# Patient Record
Sex: Female | Born: 1945 | Race: Black or African American | Hispanic: No | Smoking: Never smoker
Health system: Southern US, Community
[De-identification: ages and names within clinical notes are randomized; demographics above are authoritative.]

## PROBLEM LIST (undated history)

## (undated) DIAGNOSIS — K449 Diaphragmatic hernia without obstruction or gangrene: Secondary | ICD-10-CM

## (undated) DIAGNOSIS — R519 Headache, unspecified: Secondary | ICD-10-CM

## (undated) DIAGNOSIS — T7840XA Allergy, unspecified, initial encounter: Secondary | ICD-10-CM

## (undated) DIAGNOSIS — R51 Headache: Secondary | ICD-10-CM

## (undated) DIAGNOSIS — K219 Gastro-esophageal reflux disease without esophagitis: Secondary | ICD-10-CM

## (undated) DIAGNOSIS — I10 Essential (primary) hypertension: Secondary | ICD-10-CM

## (undated) DIAGNOSIS — M1990 Unspecified osteoarthritis, unspecified site: Secondary | ICD-10-CM

## (undated) DIAGNOSIS — D126 Benign neoplasm of colon, unspecified: Secondary | ICD-10-CM

## (undated) DIAGNOSIS — M47816 Spondylosis without myelopathy or radiculopathy, lumbar region: Secondary | ICD-10-CM

## (undated) DIAGNOSIS — K649 Unspecified hemorrhoids: Secondary | ICD-10-CM

## (undated) DIAGNOSIS — D649 Anemia, unspecified: Secondary | ICD-10-CM

## (undated) DIAGNOSIS — J309 Allergic rhinitis, unspecified: Secondary | ICD-10-CM

## (undated) DIAGNOSIS — E785 Hyperlipidemia, unspecified: Secondary | ICD-10-CM

## (undated) DIAGNOSIS — G8929 Other chronic pain: Secondary | ICD-10-CM

## (undated) DIAGNOSIS — K5900 Constipation, unspecified: Secondary | ICD-10-CM

## (undated) DIAGNOSIS — E669 Obesity, unspecified: Secondary | ICD-10-CM

## (undated) DIAGNOSIS — H269 Unspecified cataract: Secondary | ICD-10-CM

## (undated) DIAGNOSIS — K5790 Diverticulosis of intestine, part unspecified, without perforation or abscess without bleeding: Secondary | ICD-10-CM

## (undated) DIAGNOSIS — I509 Heart failure, unspecified: Secondary | ICD-10-CM

## (undated) HISTORY — DX: Unspecified osteoarthritis, unspecified site: M19.90

## (undated) HISTORY — PX: ABLATION: SHX5711

## (undated) HISTORY — DX: Headache, unspecified: R51.9

## (undated) HISTORY — PX: COLONOSCOPY: SHX174

## (undated) HISTORY — DX: Hyperlipidemia, unspecified: E78.5

## (undated) HISTORY — DX: Anemia, unspecified: D64.9

## (undated) HISTORY — DX: Allergy, unspecified, initial encounter: T78.40XA

## (undated) HISTORY — DX: Heart failure, unspecified: I50.9

## (undated) HISTORY — DX: Unspecified hemorrhoids: K64.9

## (undated) HISTORY — DX: Spondylosis without myelopathy or radiculopathy, lumbar region: M47.816

## (undated) HISTORY — DX: Diverticulosis of intestine, part unspecified, without perforation or abscess without bleeding: K57.90

## (undated) HISTORY — DX: Obesity, unspecified: E66.9

## (undated) HISTORY — DX: Essential (primary) hypertension: I10

## (undated) HISTORY — DX: Diaphragmatic hernia without obstruction or gangrene: K44.9

## (undated) HISTORY — DX: Other chronic pain: G89.29

## (undated) HISTORY — PX: TUBAL LIGATION: SHX77

## (undated) HISTORY — DX: Benign neoplasm of colon, unspecified: D12.6

## (undated) HISTORY — DX: Gastro-esophageal reflux disease without esophagitis: K21.9

## (undated) HISTORY — DX: Headache: R51

## (undated) HISTORY — DX: Allergic rhinitis, unspecified: J30.9

---

## 1998-01-29 ENCOUNTER — Other Ambulatory Visit: Admission: RE | Admit: 1998-01-29 | Discharge: 1998-01-29 | Payer: Self-pay | Admitting: *Deleted

## 1998-10-25 ENCOUNTER — Other Ambulatory Visit: Admission: RE | Admit: 1998-10-25 | Discharge: 1998-10-25 | Payer: Self-pay | Admitting: *Deleted

## 1999-04-30 ENCOUNTER — Encounter: Payer: Self-pay | Admitting: Emergency Medicine

## 1999-04-30 ENCOUNTER — Emergency Department (HOSPITAL_COMMUNITY): Admission: EM | Admit: 1999-04-30 | Discharge: 1999-04-30 | Payer: Self-pay | Admitting: Emergency Medicine

## 1999-07-03 ENCOUNTER — Other Ambulatory Visit: Admission: RE | Admit: 1999-07-03 | Discharge: 1999-07-03 | Payer: Self-pay | Admitting: *Deleted

## 1999-11-20 ENCOUNTER — Ambulatory Visit (HOSPITAL_COMMUNITY): Admission: RE | Admit: 1999-11-20 | Discharge: 1999-11-20 | Payer: Self-pay | Admitting: Family Medicine

## 1999-11-20 ENCOUNTER — Encounter: Payer: Self-pay | Admitting: Family Medicine

## 1999-12-12 ENCOUNTER — Ambulatory Visit (HOSPITAL_COMMUNITY): Admission: RE | Admit: 1999-12-12 | Discharge: 1999-12-12 | Payer: Self-pay | Admitting: Family Medicine

## 1999-12-12 ENCOUNTER — Encounter: Payer: Self-pay | Admitting: Family Medicine

## 2000-12-01 ENCOUNTER — Other Ambulatory Visit: Admission: RE | Admit: 2000-12-01 | Discharge: 2000-12-01 | Payer: Self-pay | Admitting: *Deleted

## 2001-01-07 ENCOUNTER — Other Ambulatory Visit: Admission: RE | Admit: 2001-01-07 | Discharge: 2001-01-07 | Payer: Self-pay | Admitting: *Deleted

## 2001-01-07 ENCOUNTER — Encounter (INDEPENDENT_AMBULATORY_CARE_PROVIDER_SITE_OTHER): Payer: Self-pay | Admitting: Specialist

## 2001-08-05 ENCOUNTER — Encounter: Payer: Self-pay | Admitting: Family Medicine

## 2001-08-05 ENCOUNTER — Ambulatory Visit (HOSPITAL_COMMUNITY): Admission: RE | Admit: 2001-08-05 | Discharge: 2001-08-05 | Payer: Self-pay | Admitting: Family Medicine

## 2001-10-03 ENCOUNTER — Encounter: Admission: RE | Admit: 2001-10-03 | Discharge: 2001-10-03 | Payer: Self-pay | Admitting: Family Medicine

## 2001-10-03 ENCOUNTER — Encounter: Payer: Self-pay | Admitting: Family Medicine

## 2001-10-29 ENCOUNTER — Encounter: Admission: RE | Admit: 2001-10-29 | Discharge: 2001-10-29 | Payer: Self-pay | Admitting: Family Medicine

## 2001-10-29 ENCOUNTER — Encounter: Payer: Self-pay | Admitting: Family Medicine

## 2001-11-14 ENCOUNTER — Encounter: Payer: Self-pay | Admitting: Family Medicine

## 2001-11-14 ENCOUNTER — Encounter: Admission: RE | Admit: 2001-11-14 | Discharge: 2001-11-14 | Payer: Self-pay | Admitting: Family Medicine

## 2002-08-15 ENCOUNTER — Ambulatory Visit (HOSPITAL_COMMUNITY): Admission: RE | Admit: 2002-08-15 | Discharge: 2002-08-15 | Payer: Self-pay | Admitting: Family Medicine

## 2002-08-15 ENCOUNTER — Encounter: Payer: Self-pay | Admitting: Family Medicine

## 2003-11-06 ENCOUNTER — Ambulatory Visit (HOSPITAL_COMMUNITY): Admission: RE | Admit: 2003-11-06 | Discharge: 2003-11-06 | Payer: Self-pay | Admitting: Specialist

## 2004-12-11 ENCOUNTER — Other Ambulatory Visit: Admission: RE | Admit: 2004-12-11 | Discharge: 2004-12-11 | Payer: Self-pay | Admitting: *Deleted

## 2005-02-27 ENCOUNTER — Ambulatory Visit (HOSPITAL_COMMUNITY): Admission: RE | Admit: 2005-02-27 | Discharge: 2005-02-27 | Payer: Self-pay | Admitting: *Deleted

## 2005-06-11 ENCOUNTER — Emergency Department (HOSPITAL_COMMUNITY): Admission: EM | Admit: 2005-06-11 | Discharge: 2005-06-11 | Payer: Self-pay | Admitting: *Deleted

## 2006-04-22 ENCOUNTER — Other Ambulatory Visit: Admission: RE | Admit: 2006-04-22 | Discharge: 2006-04-22 | Payer: Self-pay | Admitting: *Deleted

## 2006-05-11 ENCOUNTER — Ambulatory Visit (HOSPITAL_COMMUNITY): Admission: RE | Admit: 2006-05-11 | Discharge: 2006-05-11 | Payer: Self-pay | Admitting: Family Medicine

## 2007-02-10 ENCOUNTER — Ambulatory Visit: Payer: Self-pay | Admitting: Nurse Practitioner

## 2007-02-11 ENCOUNTER — Ambulatory Visit: Payer: Self-pay | Admitting: *Deleted

## 2007-03-30 ENCOUNTER — Ambulatory Visit: Payer: Self-pay | Admitting: Internal Medicine

## 2007-04-14 ENCOUNTER — Ambulatory Visit: Payer: Self-pay | Admitting: Internal Medicine

## 2007-04-15 ENCOUNTER — Ambulatory Visit: Payer: Self-pay | Admitting: Obstetrics & Gynecology

## 2007-04-15 ENCOUNTER — Encounter: Payer: Self-pay | Admitting: Obstetrics & Gynecology

## 2007-04-18 ENCOUNTER — Ambulatory Visit (HOSPITAL_COMMUNITY): Admission: RE | Admit: 2007-04-18 | Discharge: 2007-04-18 | Payer: Self-pay | Admitting: Obstetrics & Gynecology

## 2007-06-06 ENCOUNTER — Ambulatory Visit: Payer: Self-pay | Admitting: Internal Medicine

## 2007-06-06 ENCOUNTER — Encounter (INDEPENDENT_AMBULATORY_CARE_PROVIDER_SITE_OTHER): Payer: Self-pay | Admitting: Nurse Practitioner

## 2007-06-06 LAB — CONVERTED CEMR LAB
Cholesterol: 262 mg/dL — ABNORMAL HIGH (ref 0–200)
HDL: 63 mg/dL (ref >39)
LDL Cholesterol: 158 mg/dL — ABNORMAL HIGH (ref 0–99)
Total CHOL/HDL Ratio: 4.2

## 2007-06-17 ENCOUNTER — Ambulatory Visit: Payer: Self-pay | Admitting: Family Medicine

## 2007-06-23 ENCOUNTER — Ambulatory Visit (HOSPITAL_COMMUNITY): Admission: RE | Admit: 2007-06-23 | Discharge: 2007-06-23 | Payer: Self-pay | Admitting: Obstetrics & Gynecology

## 2007-07-07 ENCOUNTER — Ambulatory Visit: Payer: Self-pay | Admitting: Internal Medicine

## 2007-07-11 ENCOUNTER — Ambulatory Visit (HOSPITAL_COMMUNITY): Admission: RE | Admit: 2007-07-11 | Discharge: 2007-07-11 | Payer: Self-pay | Admitting: Internal Medicine

## 2007-08-12 ENCOUNTER — Ambulatory Visit: Payer: Self-pay | Admitting: Obstetrics & Gynecology

## 2007-08-23 ENCOUNTER — Encounter (INDEPENDENT_AMBULATORY_CARE_PROVIDER_SITE_OTHER): Payer: Self-pay | Admitting: Nurse Practitioner

## 2007-08-23 ENCOUNTER — Ambulatory Visit: Payer: Self-pay | Admitting: Internal Medicine

## 2007-08-23 LAB — CONVERTED CEMR LAB
ALT: 15 units/L (ref 0–35)
AST: 15 units/L (ref 0–37)
Albumin: 4.3 g/dL (ref 3.5–5.2)
Alkaline Phosphatase: 54 units/L (ref 39–117)
Bilirubin, Direct: 0.1 mg/dL (ref 0.0–0.3)
Cholesterol: 231 mg/dL — ABNORMAL HIGH (ref 0–200)
HDL: 60 mg/dL (ref >39)
Indirect Bilirubin: 0.5 mg/dL (ref 0.0–0.9)
Total Protein: 7.7 g/dL (ref 6.0–8.3)
VLDL: 23 mg/dL (ref 0–40)

## 2007-08-24 ENCOUNTER — Ambulatory Visit (HOSPITAL_COMMUNITY): Admission: RE | Admit: 2007-08-24 | Discharge: 2007-08-24 | Payer: Self-pay | Admitting: Family Medicine

## 2007-09-06 ENCOUNTER — Ambulatory Visit: Payer: Self-pay | Admitting: Family Medicine

## 2007-10-24 ENCOUNTER — Ambulatory Visit: Payer: Self-pay | Admitting: Internal Medicine

## 2007-11-22 ENCOUNTER — Ambulatory Visit: Payer: Self-pay | Admitting: Internal Medicine

## 2007-12-22 ENCOUNTER — Ambulatory Visit: Payer: Self-pay | Admitting: Family Medicine

## 2007-12-22 ENCOUNTER — Encounter (INDEPENDENT_AMBULATORY_CARE_PROVIDER_SITE_OTHER): Payer: Self-pay | Admitting: Nurse Practitioner

## 2007-12-22 LAB — CONVERTED CEMR LAB
LDL Cholesterol: 174 mg/dL — ABNORMAL HIGH (ref 0–99)
Triglycerides: 163 mg/dL — ABNORMAL HIGH (ref <150)
VLDL: 33 mg/dL (ref 0–40)

## 2008-02-16 ENCOUNTER — Ambulatory Visit: Payer: Self-pay | Admitting: Internal Medicine

## 2008-02-16 ENCOUNTER — Encounter (INDEPENDENT_AMBULATORY_CARE_PROVIDER_SITE_OTHER): Payer: Self-pay | Admitting: Nurse Practitioner

## 2008-02-16 LAB — CONVERTED CEMR LAB
ALT: 20 units/L (ref 0–35)
Basophils Relative: 1 % (ref 0–1)
CO2: 27 meq/L (ref 19–32)
Eosinophils Absolute: 0.9 10*3/uL — ABNORMAL HIGH (ref 0.0–0.7)
Eosinophils Relative: 12 % — ABNORMAL HIGH (ref 0–5)
Glucose, Bld: 93 mg/dL (ref 70–99)
MCHC: 33.8 g/dL (ref 30.0–36.0)
Neutrophils Relative %: 39 % — ABNORMAL LOW (ref 43–77)
Platelets: 318 10*3/uL (ref 150–400)
Potassium: 3.7 meq/L (ref 3.5–5.3)
RBC: 4.56 M/uL (ref 3.87–5.11)
RDW: 13.1 % (ref 11.5–15.5)
TSH: 0.438 microintl units/mL (ref 0.350–5.50)
Total Bilirubin: 0.5 mg/dL (ref 0.3–1.2)

## 2008-03-28 ENCOUNTER — Ambulatory Visit: Payer: Self-pay | Admitting: Internal Medicine

## 2008-04-11 ENCOUNTER — Ambulatory Visit: Payer: Self-pay | Admitting: Internal Medicine

## 2008-05-03 ENCOUNTER — Encounter: Payer: Self-pay | Admitting: Obstetrics and Gynecology

## 2008-05-03 ENCOUNTER — Ambulatory Visit: Payer: Self-pay | Admitting: Obstetrics and Gynecology

## 2008-05-21 ENCOUNTER — Ambulatory Visit: Payer: Self-pay | Admitting: Internal Medicine

## 2008-05-24 ENCOUNTER — Ambulatory Visit: Payer: Self-pay | Admitting: Internal Medicine

## 2008-05-24 ENCOUNTER — Encounter: Payer: Self-pay | Admitting: Internal Medicine

## 2008-05-24 LAB — CONVERTED CEMR LAB
Cholesterol: 212 mg/dL — ABNORMAL HIGH (ref 0–200)
HDL: 63 mg/dL (ref >39)
LDL Cholesterol: 115 mg/dL — ABNORMAL HIGH (ref 0–99)
VLDL: 34 mg/dL (ref 0–40)

## 2008-06-18 ENCOUNTER — Ambulatory Visit: Payer: Self-pay | Admitting: Internal Medicine

## 2008-06-27 ENCOUNTER — Ambulatory Visit: Payer: Self-pay | Admitting: Internal Medicine

## 2008-07-02 ENCOUNTER — Emergency Department (HOSPITAL_COMMUNITY): Admission: EM | Admit: 2008-07-02 | Discharge: 2008-07-02 | Payer: Self-pay | Admitting: Family Medicine

## 2008-07-26 ENCOUNTER — Ambulatory Visit: Payer: Self-pay | Admitting: Internal Medicine

## 2008-08-09 ENCOUNTER — Ambulatory Visit: Payer: Self-pay | Admitting: Internal Medicine

## 2008-09-03 ENCOUNTER — Ambulatory Visit (HOSPITAL_COMMUNITY): Admission: RE | Admit: 2008-09-03 | Discharge: 2008-09-03 | Payer: Self-pay | Admitting: Family Medicine

## 2008-09-11 ENCOUNTER — Ambulatory Visit: Payer: Self-pay | Admitting: Internal Medicine

## 2008-09-11 LAB — CONVERTED CEMR LAB
Cholesterol: 210 mg/dL — ABNORMAL HIGH (ref 0–200)
HCV Ab: NEGATIVE
HDL: 66 mg/dL (ref >39)
Hep A Total Ab: NEGATIVE
Hepatitis B Surface Ag: NEGATIVE
Triglycerides: 133 mg/dL (ref <150)
VLDL: 27 mg/dL (ref 0–40)

## 2008-09-30 ENCOUNTER — Emergency Department (HOSPITAL_COMMUNITY): Admission: EM | Admit: 2008-09-30 | Discharge: 2008-09-30 | Payer: Self-pay | Admitting: Family Medicine

## 2008-12-12 ENCOUNTER — Ambulatory Visit: Payer: Self-pay | Admitting: Internal Medicine

## 2009-01-31 ENCOUNTER — Telehealth (INDEPENDENT_AMBULATORY_CARE_PROVIDER_SITE_OTHER): Payer: Self-pay | Admitting: *Deleted

## 2009-02-01 ENCOUNTER — Ambulatory Visit: Payer: Self-pay | Admitting: Internal Medicine

## 2009-02-01 LAB — CONVERTED CEMR LAB
HDL: 60 mg/dL (ref >39)
LDL Cholesterol: 103 mg/dL — ABNORMAL HIGH (ref 0–99)
Total CHOL/HDL Ratio: 3
Triglycerides: 76 mg/dL (ref <150)
VLDL: 15 mg/dL (ref 0–40)

## 2009-06-27 ENCOUNTER — Ambulatory Visit: Payer: Self-pay | Admitting: Family Medicine

## 2009-09-19 ENCOUNTER — Ambulatory Visit (HOSPITAL_COMMUNITY): Admission: RE | Admit: 2009-09-19 | Discharge: 2009-09-19 | Payer: Self-pay | Admitting: Internal Medicine

## 2009-09-19 ENCOUNTER — Ambulatory Visit: Payer: Self-pay | Admitting: Obstetrics & Gynecology

## 2009-09-23 ENCOUNTER — Ambulatory Visit: Payer: Self-pay | Admitting: Internal Medicine

## 2009-11-18 ENCOUNTER — Ambulatory Visit: Payer: Self-pay | Admitting: Internal Medicine

## 2010-01-22 ENCOUNTER — Ambulatory Visit: Payer: Self-pay | Admitting: Internal Medicine

## 2010-01-22 LAB — CONVERTED CEMR LAB
AST: 15 units/L (ref 0–37)
BUN: 19 mg/dL (ref 6–23)
CO2: 26 meq/L (ref 19–32)
Cholesterol: 199 mg/dL (ref 0–200)
Potassium: 3.7 meq/L (ref 3.5–5.3)
Sodium: 143 meq/L (ref 135–145)
Total CHOL/HDL Ratio: 2.6
Total Protein: 7.4 g/dL (ref 6.0–8.3)
Triglycerides: 83 mg/dL (ref <150)

## 2010-04-06 ENCOUNTER — Emergency Department (HOSPITAL_COMMUNITY): Admission: EM | Admit: 2010-04-06 | Discharge: 2010-04-06 | Payer: Self-pay | Admitting: Family Medicine

## 2010-04-28 ENCOUNTER — Ambulatory Visit: Payer: Self-pay | Admitting: Internal Medicine

## 2010-09-22 ENCOUNTER — Ambulatory Visit (HOSPITAL_COMMUNITY)
Admission: RE | Admit: 2010-09-22 | Discharge: 2010-09-22 | Payer: Self-pay | Source: Home / Self Care | Admitting: Internal Medicine

## 2010-10-31 ENCOUNTER — Encounter: Payer: Self-pay | Admitting: Cardiology

## 2010-10-31 ENCOUNTER — Encounter (INDEPENDENT_AMBULATORY_CARE_PROVIDER_SITE_OTHER): Payer: Self-pay | Admitting: Family Medicine

## 2010-10-31 LAB — CONVERTED CEMR LAB
BUN: 26 mg/dL — ABNORMAL HIGH (ref 6–23)
Basophils Relative: 0 % (ref 0–1)
Calcium: 10.2 mg/dL (ref 8.4–10.5)
HCT: 38.6 % (ref 36.0–46.0)
Hemoglobin: 12.5 g/dL (ref 12.0–15.0)
Lymphocytes Relative: 30 % (ref 12–46)
Lymphs Abs: 2.4 10*3/uL (ref 0.7–4.0)
MCHC: 32.4 g/dL (ref 30.0–36.0)
MCV: 86.9 fL (ref 78.0–100.0)
Monocytes Absolute: 0.7 10*3/uL (ref 0.1–1.0)
Monocytes Relative: 8 % (ref 3–12)
RDW: 13.8 % (ref 11.5–15.5)
Sodium: 138 meq/L (ref 135–145)

## 2010-11-04 ENCOUNTER — Ambulatory Visit (HOSPITAL_COMMUNITY)
Admission: RE | Admit: 2010-11-04 | Discharge: 2010-11-04 | Payer: Self-pay | Source: Home / Self Care | Attending: Family Medicine | Admitting: Family Medicine

## 2010-11-08 ENCOUNTER — Encounter: Payer: Self-pay | Admitting: Specialist

## 2010-11-09 ENCOUNTER — Encounter: Payer: Self-pay | Admitting: *Deleted

## 2010-11-21 ENCOUNTER — Ambulatory Visit: Admit: 2010-11-21 | Payer: Self-pay | Admitting: Cardiology

## 2010-11-21 ENCOUNTER — Ambulatory Visit (INDEPENDENT_AMBULATORY_CARE_PROVIDER_SITE_OTHER): Payer: Self-pay | Admitting: Cardiology

## 2010-11-21 ENCOUNTER — Encounter: Payer: Self-pay | Admitting: Cardiology

## 2010-11-21 DIAGNOSIS — R002 Palpitations: Secondary | ICD-10-CM | POA: Insufficient documentation

## 2010-11-21 DIAGNOSIS — R0609 Other forms of dyspnea: Secondary | ICD-10-CM

## 2010-11-28 ENCOUNTER — Encounter: Payer: Self-pay | Admitting: Cardiology

## 2010-11-28 ENCOUNTER — Ambulatory Visit (HOSPITAL_COMMUNITY): Payer: Self-pay | Attending: Cardiology

## 2010-11-28 ENCOUNTER — Other Ambulatory Visit: Payer: Self-pay | Admitting: Cardiology

## 2010-11-28 ENCOUNTER — Other Ambulatory Visit (INDEPENDENT_AMBULATORY_CARE_PROVIDER_SITE_OTHER): Payer: Self-pay

## 2010-11-28 ENCOUNTER — Encounter (INDEPENDENT_AMBULATORY_CARE_PROVIDER_SITE_OTHER): Payer: Self-pay

## 2010-11-28 DIAGNOSIS — R0609 Other forms of dyspnea: Secondary | ICD-10-CM | POA: Insufficient documentation

## 2010-11-28 DIAGNOSIS — R0989 Other specified symptoms and signs involving the circulatory and respiratory systems: Secondary | ICD-10-CM | POA: Insufficient documentation

## 2010-11-28 DIAGNOSIS — Z79899 Other long term (current) drug therapy: Secondary | ICD-10-CM

## 2010-11-28 DIAGNOSIS — I10 Essential (primary) hypertension: Secondary | ICD-10-CM | POA: Insufficient documentation

## 2010-11-28 DIAGNOSIS — I379 Nonrheumatic pulmonary valve disorder, unspecified: Secondary | ICD-10-CM | POA: Insufficient documentation

## 2010-11-28 DIAGNOSIS — E669 Obesity, unspecified: Secondary | ICD-10-CM | POA: Insufficient documentation

## 2010-11-28 DIAGNOSIS — R002 Palpitations: Secondary | ICD-10-CM | POA: Insufficient documentation

## 2010-11-28 DIAGNOSIS — E785 Hyperlipidemia, unspecified: Secondary | ICD-10-CM | POA: Insufficient documentation

## 2010-11-28 DIAGNOSIS — I059 Rheumatic mitral valve disease, unspecified: Secondary | ICD-10-CM | POA: Insufficient documentation

## 2010-11-28 DIAGNOSIS — I079 Rheumatic tricuspid valve disease, unspecified: Secondary | ICD-10-CM | POA: Insufficient documentation

## 2010-11-28 LAB — HEPATIC FUNCTION PANEL
Alkaline Phosphatase: 64 U/L (ref 39–117)
Bilirubin, Direct: 0.1 mg/dL (ref 0.0–0.3)
Total Protein: 6.7 g/dL (ref 6.0–8.3)

## 2010-11-28 LAB — LIPID PANEL
HDL: 65.4 mg/dL (ref >39.00)
LDL Cholesterol: 91 mg/dL (ref 0–99)
Total CHOL/HDL Ratio: 3

## 2010-12-04 NOTE — Assessment & Plan Note (Signed)
Summary: NP6/PALPITATIONS/LG/PER DEBORAH PT HAS [place]...   Vital Signs:  Patient profile:   65 year old female Height:      63 inches Weight:      192 pounds BMI:     34.13 Pulse rate:   54 / minute Pulse rhythm:   regular BP sitting:   126 / 68  (left arm) Cuff size:   large  Vitals Entered By: Doug Sou CMA (November 21, 2010 9:21 AM)  Primary Provider:  McPherson   History of Present Illness: 65 yo with history of HTN and hyperlipidemia presents for evaluation of dyspnea and palpitations.  Patient reports dyspnea with walking about 1/4 mile and shortness of breath with steps for the last several months.  She has had some weight gain over the last year or so as well.  She has also been getting fluttering in her chest and spells of tachycardia that will last for a few seconds.  She will feel "dizzy" with these spells but no syncope.  No chest pain, no orthopnea or PND.    ECG: NSR, normal  Labs (1/12): K 4.7, creatinine 0.93, HCT 38.6, TSH normal  Current Medications (verified): 1)  Tiazac 120 Mg Xr24h-Cap (Diltiazem Hcl Er Beads) .... Take One Capsule Once Daily 2)  Nasacort Aq 55 Mcg/act Aers (Triamcinolone Acetonide) .... Use Two Sprays in Each Nostril At Night 3)  Tricor 145 Mg Tabs (Fenofibrate) .... Take One Tablet Once Daily 4)  Diovan Hct 320-25 Mg Tabs (Valsartan-Hydrochlorothiazide) .... Take One Tablet Once Daily 5)  Lipitor 40 Mg Tabs (Atorvastatin Calcium) .... Take One Tablet Once Daily 6)  Xyzal 5 Mg Tabs (Levocetirizine Dihydrochloride) .... Take One Tablet Once Daily 7)  Famotidine 20 Mg Tabs (Famotidine) .... Take 2 Tablets Every Evening 8)  Nexium 40 Mg Cpdr (Esomeprazole Magnesium) .... Take One Capsule Once Daily 9)  Metoprolol Tartrate 50 Mg Tabs (Metoprolol Tartrate) .... Take 2 Tablets Once Daily 10)  Dok 100 Mg Caps (Docusate Sodium) .... Take One Capsule At Bedtime  Allergies (verified): 1)  ! Sulfa  Past History:  Past  Medical History: 1. GERD 2. Hyperlipidemia 3. HTN 4. Osteoarthritis 5. Obesity  Family History: Mother, father, and sister with "heart trouble"  Social History: Nonsmoker.   Lives in [place] with grandson Does custodial work  Review of Systems       All systems reviewed and negative except as per HPI.   Physical Exam  General:  Well developed, well nourished, in no acute distress.  Obese.  Head:  normocephalic and atraumatic Nose:  no deformity, discharge, inflammation, or lesions Mouth:  Teeth, gums and palate normal. Oral mucosa normal. Neck:  Neck supple, no JVD. No masses, thyromegaly or abnormal cervical nodes. Lungs:  Clear bilaterally to auscultation and percussion. Heart:  Non-displaced PMI, chest non-tender; regular rate and rhythm, S1, S2 without murmurs, rubs or gallops. Carotid upstroke normal, no bruit.  Pedals normal pulses. No edema, no varicosities. Abdomen:  Bowel sounds positive; abdomen soft and non-tender without masses, organomegaly, or hernias noted. No hepatosplenomegaly. Extremities:  No clubbing or cyanosis. Neurologic:  Alert and oriented x 3. Skin:  Intact without lesions or rashes. Psych:  Normal affect.   Impression & Recommendations:  Problem # 1:  PALPITATIONS (ICD-785.1) Patient has runs of chest fluttering that feel like her heart racing and last for a few seconds.  These happen most days.  I will get a 48 hour holter monitor to make sure that  there is no evidence for a significant arrhythmia like atrial fibrillation.   Problem # 2:  SHORTNESS OF BREATH (ICD-786.05) Patient has some mild exertional dyspnea that seems new over the last 6 months or so.  She has also gained weight during this period.  I will get an echo to assess LV systolic and diastolic function, but I suspect that it is more likely to be due to obesity and deconditioning.  She needs to start a walking program for exercise.    I will check lipids/LFTs.   Other  Orders: Holter Monitor (Holter Monitor) Echocardiogram (Echo)  Patient Instructions: 1)  Your physician has requested that you have an echocardiogram.  Echocardiography is a painless test that uses sound waves to create images of your heart. It provides your doctor with information about the size and shape of your heart and how well your heart's chambers and valves are working.  This procedure takes approximately one hour. There are no restrictions for this procedure. 2)  Your physician has recommended that you wear a holter monitor.  Holter monitors are medical devices that record the heart's electrical activity. Doctors most often use these monitors to diagnose arrhythmias. Arrhythmias are problems with the speed or rhythm of the heartbeat. The monitor is a small, portable device. You can wear one while you do your normal daily activities. This is usually used to diagnose what is causing palpitations/syncope (passing out). [place] 3)  Your physician recommends that you return for a FASTING lipid profile/liver profile 785.1  4)  Your physician recommends that you schedule a follow-up appointment in: 2 weeks with Dr Aundra Dubin.   Orders Added: 1)  Holter Monitor [Holter Monitor] 2)  Echocardiogram [Echo]

## 2010-12-10 ENCOUNTER — Encounter: Payer: Self-pay | Admitting: Cardiology

## 2010-12-10 ENCOUNTER — Ambulatory Visit (INDEPENDENT_AMBULATORY_CARE_PROVIDER_SITE_OTHER): Payer: Self-pay | Admitting: Cardiology

## 2010-12-10 DIAGNOSIS — R0989 Other specified symptoms and signs involving the circulatory and respiratory systems: Secondary | ICD-10-CM

## 2010-12-16 NOTE — Procedures (Signed)
Summary: summary report  summary report   Imported By: Parks Neptune 12/10/2010 16:25:36  _____________________________________________________________________  External Attachment:    Type:   Image     Comment:   External Document

## 2010-12-16 NOTE — Assessment & Plan Note (Signed)
Summary: f3wks per check out/ f/u echo/monitor/lab/saf   Primary Provider:  McPherson  CC:  pt complains of sob.  History of Present Illness: 65 yo with history of HTN and hyperlipidemia returns for evaluation of dyspnea and palpitations.  Patient reported dyspnea with walking about 1/4 mile and shortness of breath with steps for the last several months.  She has had some weight gain over the last year or so as well.  She had also been getting fluttering in her chest and spells of tachycardia that would last for a few seconds.  She will felt "dizzy" with these spells but no syncope.  No chest pain, no orthopnea or PND.    I had her do a holter monitor.  This showed occasional PACs but was otherwise normal.  Echo showed normal EF with no significant abnormalities.  Today, she tells me that the fluttering has actually stopped completely.  She still has some dyspnea but is more active and is feeling better in general.  She starting to do some walking for exercise.   ECG: NSR, rSR' in V1  Labs (1/12): K 4.7, creatinine 0.93, HCT 38.6, TSH normal Labs (2/12): HDL 65, LDL 91  Current Medications (verified): 1)  Tiazac 120 Mg Xr24h-Cap (Diltiazem Hcl Er Beads) .... Take One Capsule Once Daily 2)  Nasacort Aq 55 Mcg/act Aers (Triamcinolone Acetonide) .... Use Two Sprays in Each Nostril At Night 3)  Tricor 145 Mg Tabs (Fenofibrate) .... Take One Tablet Once Daily 4)  Diovan Hct 320-25 Mg Tabs (Valsartan-Hydrochlorothiazide) .... Take One Tablet Once Daily 5)  Lipitor 40 Mg Tabs (Atorvastatin Calcium) .... Take One Tablet Once Daily 6)  Xyzal 5 Mg Tabs (Levocetirizine Dihydrochloride) .... Take One Tablet Once Daily 7)  Famotidine 20 Mg Tabs (Famotidine) .... Take 2 Tablets Every Evening 8)  Nexium 40 Mg Cpdr (Esomeprazole Magnesium) .... Take One Capsule Once Daily 9)  Metoprolol Tartrate 50 Mg Tabs (Metoprolol Tartrate) .Marland Kitchen.. 1 Tab By Mouth Two Times A Day 10)  Dok 100 Mg Caps (Docusate Sodium)  .... Take One Capsule At Bedtime 11)  Celebrex .... As Needed  Allergies: 1)  ! Sulfa  Past History:  Past Medical History: 1. GERD 2. Hyperlipidemia 3. HTN 4. Osteoarthritis 5. Obesity 6. Palpitations: Holter (2/12) with occasional PACs, otherwise normal 7. Echo (2/12): EF 60-75% with no significant valvular abnormalities.   Family History: Reviewed history from 11/21/2010 and no changes required. Mother, father, and sister with "heart trouble"  Social History: Reviewed history from 11/21/2010 and no changes required. Nonsmoker.   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  Obese.  Neck:  Neck supple, no JVD. No masses, thyromegaly or abnormal cervical nodes. Lungs:  Clear bilaterally to auscultation and percussion. Heart:  Non-displaced PMI, chest non-tender; regular rate and rhythm, S1, S2 without murmurs, rubs or gallops. Carotid upstroke normal, no bruit.  Pedals normal pulses. No edema, no varicosities. Abdomen:  Bowel sounds positive; abdomen soft and non-tender without masses, organomegaly, or hernias noted. No  hepatosplenomegaly. Extremities:  No clubbing or cyanosis. Neurologic:  Alert and oriented x 3. Psych:  Normal affect.   Impression & Recommendations:  Problem # 1:  PALPITATIONS (ICD-785.1) Probably due to PACs, which were seen on holter.  Per patient, they have essentially resolved.  I advised her to avoid caffeine and over-the-counter stimulants.  She also needs to get good sleep at night.   Problem # 2:  SHORTNESS OF BREATH (ICD-786.05) Normal echo.  I suspect that this is due primarily to  obesity and deconditioning.   Patient Instructions: 1)  Your physician recommends that you schedule a follow-up appointment as needed with Dr Aundra Dubin.

## 2010-12-30 NOTE — Letter (Signed)
Summary: [facility] Office Note   [facility] Office Note   Imported By: Sallee Provencal 12/19/2010 16:24:36  _____________________________________________________________________  External Attachment:    Type:   Image     Comment:   External Document

## 2011-01-04 LAB — WOUND CULTURE: Culture: NO GROWTH

## 2011-03-03 NOTE — Group Therapy Note (Signed)
[name], [name] NO.:  [id number]   MEDICAL RECORD NO.:  [id number]          PATIENT TYPE:  WOC   LOCATION:  [facility]                   FACILITY:  [facility]   PHYSICIAN:  Darron Doom, MD        DATE OF BIRTH:  11-17-1945   DATE OF SERVICE:  06/17/2007                                  CLINIC NOTE   CHIEF COMPLAINT:  Left lower quadrant pain and to follow up on tests.   HISTORY OF PRESENT ILLNESS:  Patient is a 65 year old African-American  G3, P3, who came for evaluation and annual exam two months ago, was  complaining of brown vaginal discharge at that time.  She still states  she has a little bit of brown discharge, but this is not every day and  she does not have any frank vaginal bleeding.  She also sometimes, about  one time per month, has left lower quadrant pain.  She denies any  dysuria or any other complaints.  She had a D and C about three years  ago by Dr. Serafina Royals and before that, she had a lot of postmenopausal  bleeding.  She did have an endometrial biopsy and was told that she did  not have any evidence of cancer at that time.  She has been told in the  past that she had fibroids though.   Abdominal ultrasound was obtained after last visit for this brownish  discharge, which showed a calcified fibroid involving the right  posterolateral aspect of the uterus.  The fibroids present make it  difficult to see the thickness of the endometrial canal.  It was  recommended a followup and sonohysterography be done to make sure there  is no submucosal involvement nor any thickening of the endometrial  lining.  Genital cultures done at that time, Gonorrhea and Chlamydia  were negative and a Pap smear was negative for intraepithelial lesions  or malignancy.   PHYSICAL EXAM:  VITAL SIGNS:  Temperature 97.8, pulse 74, blood pressure  146/96 and 171/91 on recheck.  Weight 202 pounds.  Height 5 feet 3  inches.   ASSESSMENT AND PLAN:  1. [age]-year-old  female with intermittent vaginal discharge and      intermittent left lower quadrant pain.  Based on ultrasound result,      will schedule patient for sonohysterography to insure there is no      thickening of the endometrial lining and the patient does not need      an additional endometrial biopsy to rule out endometrial carcinoma.  2. Based on patient's blood pressure, recommend she follow up with the      primary care physician to see if she needs to be put on any      medications for this.  3. We will schedule followup with Dr. Hulan Fray in clinic after patient      obtains sonohysterography to see where we go from there.    ______________________________  Andrew Au, MD    ______________________________  Darron Doom, MD   PR/MEDQ  D:  06/17/2007  T:  06/18/2007  Job:  [id number]

## 2011-03-03 NOTE — Group Therapy Note (Signed)
NAMEBETHINE, HERMOSO NO.:  [id number]   MEDICAL RECORD NO.:  [id number]          PATIENT TYPE:  WOC   LOCATION:  [facility]                   FACILITY:  [facility]   PHYSICIAN:  Emily Filbert, MD        DATE OF BIRTH:  12-Mar-1946   DATE OF SERVICE:                                  CLINIC NOTE   Annual exam on [place].   [name] is a 65 year old widowed black, gravida 3, para 3 who comes  in for her annual exam.  Her only complaint is that of brownish vaginal  discharge for the last month.  She reports going through menopause in  my early 87s and denies any postmenopausal bleeding until this brown  discharge.   PAST MEDICAL HISTORY:  Obesity, high cholesterol, hypertension and  reflux.   PAST SURGICAL HISTORY:  Tubal ligation and a D&C for dysfunctional  uterine bleeding in her 42s.   REVIEW OF SYSTEMS:  She is abstinent.  She was recently laid off from  [employer].  Her last Pap smear and mammogram were done in  2007.  Her Pap was done at Dr. Jovita Gamma office prior to termination  of her insurance.   FAMILY HISTORY:  Negative for breast, GYN malignancies but positive for  colon cancer in her maternal uncle.   No known drug allergies.  No latex allergies.   SOCIAL HISTORY:  Is negative for tobacco, alcohol or drug use.   MEDICATIONS:  1. Prevacid 1 daily.  2. Toprol 1 daily.  3. Diovan 1 daily.  4. Levitra  1 daily.   PHYSICAL EXAMINATION:  Weight 196 pounds.  Blood pressure 126/81.  HEENT:  Normal.  HEART:  Regular rate and rhythm.  LUNGS:  Clear to auscultation bilaterally.  ABDOMEN:  Benign.  BREASTS:  Normal.  EXTERNAL GENITALIA:  Mild atrophy, vagina atrophic, cervix atrophic and  slightly stenotic.  No etiology of bleeding seen.  Bi-manual exam normal  __________ and shape, midplane.  Uterus not enlarged.  __________.   ASSESSMENT AND PLAN:  Annual exam __________ Pap.  Her mammogram is  already scheduled for July at  [facility] [place].  Recommend  self-breast exam monthly.  Postmenopausal bleeding.  Check a  gynecology ultrasound.  Pap with cervical cultures have been done.  She  will follow up a week after her ultrasound for her results.      Emily Filbert, MD     MCD/MEDQ  D:  04/15/2007  T:  04/15/2007  Job:  [id number]

## 2011-03-03 NOTE — Group Therapy Note (Signed)
NAME:  HASNA, TAFF.:  [id number]   MEDICAL RECORD NO.:  [id number]          PATIENT TYPE:  WOC   LOCATION:  [facility]                   FACILITY:  [facility]   PHYSICIAN:  Andrew Au, MD        DATE OF BIRTH:  1946/05/10   DATE OF SERVICE:                                  CLINIC NOTE   DICTATED BY:  Milbert Coulter, Student Nurse Midwife.   Ms. Manspeaker is a 65 year old G3 P3-0-0-3 here for an annual exam and Pap.  SHE HAS NO DRUG ALLERGIES.   CURRENT MEDICATIONS:  1. Toprol x1 p.o. daily.  2. Diovan x1 p.o. daily.  3. Two cholesterol medications that she did not have the name of x1      daily.  4. Nexium 40 mg p.o. x1 daily.  5. Colace p.o. one tablet daily.   PAST MEDICAL HISTORY:  1. Obesity.  2. High cholesterol.  3. Hypertension.  4. Reflux.   PAST SURGICAL HISTORY:  1. Tubal ligation.  2. A D&C for dysfunctional uterine bleeding about 5 years ago.   MENSTRUAL HISTORY:  Patient does not remember when she first had  periods.  She has not had periods for about 5 years but has had brownish  discharge in that time.  She has been worked up and endometrial cancer  has been ruled out by endometrial biopsy and ultrasound.   FAMILY HISTORY:  Positive for colon cancer in her uncle.   SOCIAL HISTORY:  The patient denies using tobacco, alcohol or illicit  drugs.   REVIEW OF SYSTEMS:  Negative except for constipation that started when  she started her blood pressure medications.  It is well controlled by  increasing fiber and taking Colace.   PHYSICAL EXAMINATION:  VITAL SIGNS:  Temperature 97.1, pulse 54,  respiratory rate 16, blood pressure 127/72, she is 201.8 pounds and her  height is 5 feet 3 inches.  CONSTITUTIONAL:  She is a middle-aged African American woman in no  apparent distress, obese.  HEENT:  Normocephalic.  NECK:  Supple, no thyromegaly.  HEART:  Normal S1 and S2; no murmurs, rubs or gallops; distant heart  sounds.  LUNGS:  Clear to  auscultation bilaterally, normal respiratory effort.  ABDOMEN:  Obese, soft, nontender, no hepatosplenomegaly, bowel sounds  positive in all four quadrants.  PELVIC:  BUS normal, no masses or lesions on the external female  genitalia.  Sparse pubic hairs, dry mucous membranes.  Vaginal walls are  pale pink with physiologic discharge, a little rugation, no lesions.  Cervix is midline, smooth, pink and Pap was obtained.  Bimanual exam  showed no adnexal masses, no cervical motion tenderness and ovaries were  nonpalpable and nontender.  BREAST:  Normal, no masses, no skin changes, no nipple discharge, no  lymphadenopathy.  SKIN:  Warm, dry and appropriate color for her race.  NEUROLOGICAL:  One plus deep tendon reflexes, no clonus.  EXTREMITIES:  Showed no edema, capillary refill less than three seconds  and negative Homan's sign.   ASSESSMENT:  Normal GYN exam, well-controlled hypertension.  Patient is  up to date on colonoscopy, she  said she had one about 5 years ago.  Patient will schedule a mammogram, the last mammogram was one year ago  and will call patient with results of Pap smear.  Followup in one year.           ______________________________  Andrew Au, MD     PR/MEDQ  D:  05/03/2008  T:  05/03/2008  Job:  [id number]

## 2011-08-26 ENCOUNTER — Other Ambulatory Visit (HOSPITAL_COMMUNITY): Payer: Self-pay | Admitting: Family Medicine

## 2011-08-26 DIAGNOSIS — Z1231 Encounter for screening mammogram for malignant neoplasm of breast: Secondary | ICD-10-CM

## 2011-09-30 ENCOUNTER — Ambulatory Visit (HOSPITAL_COMMUNITY): Admission: RE | Admit: 2011-09-30 | Payer: Self-pay | Source: Ambulatory Visit

## 2012-01-14 ENCOUNTER — Encounter: Payer: Self-pay | Admitting: Internal Medicine

## 2012-01-14 DIAGNOSIS — M47816 Spondylosis without myelopathy or radiculopathy, lumbar region: Secondary | ICD-10-CM | POA: Insufficient documentation

## 2012-01-14 DIAGNOSIS — Z6839 Body mass index (BMI) 39.0-39.9, adult: Secondary | ICD-10-CM | POA: Insufficient documentation

## 2012-01-14 DIAGNOSIS — M1990 Unspecified osteoarthritis, unspecified site: Secondary | ICD-10-CM | POA: Insufficient documentation

## 2012-01-14 DIAGNOSIS — I10 Essential (primary) hypertension: Secondary | ICD-10-CM | POA: Insufficient documentation

## 2012-01-14 DIAGNOSIS — Z0001 Encounter for general adult medical examination with abnormal findings: Secondary | ICD-10-CM | POA: Insufficient documentation

## 2012-01-14 DIAGNOSIS — Z0000 Encounter for general adult medical examination without abnormal findings: Secondary | ICD-10-CM

## 2012-01-14 DIAGNOSIS — K219 Gastro-esophageal reflux disease without esophagitis: Secondary | ICD-10-CM | POA: Insufficient documentation

## 2012-01-14 DIAGNOSIS — E669 Obesity, unspecified: Secondary | ICD-10-CM | POA: Insufficient documentation

## 2012-01-14 DIAGNOSIS — E782 Mixed hyperlipidemia: Secondary | ICD-10-CM | POA: Insufficient documentation

## 2012-01-14 DIAGNOSIS — E785 Hyperlipidemia, unspecified: Secondary | ICD-10-CM | POA: Insufficient documentation

## 2012-01-20 ENCOUNTER — Other Ambulatory Visit (INDEPENDENT_AMBULATORY_CARE_PROVIDER_SITE_OTHER): Payer: Medicare HMO

## 2012-01-20 ENCOUNTER — Encounter: Payer: Self-pay | Admitting: Internal Medicine

## 2012-01-20 ENCOUNTER — Ambulatory Visit (INDEPENDENT_AMBULATORY_CARE_PROVIDER_SITE_OTHER): Payer: Medicare HMO | Admitting: Internal Medicine

## 2012-01-20 ENCOUNTER — Other Ambulatory Visit: Payer: Self-pay | Admitting: Internal Medicine

## 2012-01-20 VITALS — BP 128/90 | HR 64 | Temp 97.7°F | Ht 63.0 in | Wt 208.0 lb

## 2012-01-20 DIAGNOSIS — M25562 Pain in left knee: Secondary | ICD-10-CM | POA: Insufficient documentation

## 2012-01-20 DIAGNOSIS — R1310 Dysphagia, unspecified: Secondary | ICD-10-CM | POA: Insufficient documentation

## 2012-01-20 DIAGNOSIS — M25561 Pain in right knee: Secondary | ICD-10-CM | POA: Insufficient documentation

## 2012-01-20 DIAGNOSIS — R21 Rash and other nonspecific skin eruption: Secondary | ICD-10-CM | POA: Insufficient documentation

## 2012-01-20 DIAGNOSIS — M25569 Pain in unspecified knee: Secondary | ICD-10-CM

## 2012-01-20 DIAGNOSIS — Z0000 Encounter for general adult medical examination without abnormal findings: Secondary | ICD-10-CM

## 2012-01-20 DIAGNOSIS — E039 Hypothyroidism, unspecified: Secondary | ICD-10-CM

## 2012-01-20 LAB — URINALYSIS, ROUTINE W REFLEX MICROSCOPIC
Ketones, ur: NEGATIVE
Leukocytes, UA: NEGATIVE
Nitrite: NEGATIVE
Specific Gravity, Urine: 1.02 (ref 1.000–1.030)
Urobilinogen, UA: 0.2 (ref 0.0–1.0)

## 2012-01-20 LAB — HEPATIC FUNCTION PANEL
ALT: 18 U/L (ref 0–35)
AST: 21 U/L (ref 0–37)
Albumin: 3.7 g/dL (ref 3.5–5.2)
Alkaline Phosphatase: 90 U/L (ref 39–117)
Total Protein: 7.4 g/dL (ref 6.0–8.3)

## 2012-01-20 LAB — CBC WITH DIFFERENTIAL/PLATELET
Basophils Absolute: 0.1 10*3/uL (ref 0.0–0.1)
Lymphocytes Relative: 31 % (ref 12.0–46.0)
Lymphs Abs: 2.6 10*3/uL (ref 0.7–4.0)
Monocytes Relative: 9.8 % (ref 3.0–12.0)
Neutrophils Relative %: 48.1 % (ref 43.0–77.0)
Platelets: 355 10*3/uL (ref 150.0–400.0)
RDW: 14.4 % (ref 11.5–14.6)
WBC: 8.3 10*3/uL (ref 4.5–10.5)

## 2012-01-20 LAB — BASIC METABOLIC PANEL
BUN: 17 mg/dL (ref 6–23)
CO2: 28 mEq/L (ref 19–32)
Glucose, Bld: 100 mg/dL — ABNORMAL HIGH (ref 70–99)
Potassium: 3.7 mEq/L (ref 3.5–5.1)
Sodium: 140 mEq/L (ref 135–145)

## 2012-01-20 LAB — TSH: TSH: 0.7 u[IU]/mL (ref 0.35–5.50)

## 2012-01-20 MED ORDER — LOSARTAN POTASSIUM-HCTZ 100-25 MG PO TABS: 1.0000 | ORAL_TABLET | Freq: Every day | ORAL | Status: DC

## 2012-01-20 MED ORDER — METOPROLOL TARTRATE 50 MG PO TABS: 50.0000 mg | ORAL_TABLET | Freq: Two times a day (BID) | ORAL | Status: DC

## 2012-01-20 MED ORDER — DILTIAZEM HCL ER BEADS 120 MG PO CP24: 120.0000 mg | ORAL_CAPSULE | Freq: Every day | ORAL | Status: DC

## 2012-01-20 MED ORDER — NYSTATIN 100000 UNIT/GM EX POWD: CUTANEOUS | Status: DC

## 2012-01-20 MED ORDER — ATORVASTATIN CALCIUM 20 MG PO TABS: 20.0000 mg | ORAL_TABLET | Freq: Every day | ORAL | Status: DC

## 2012-01-20 MED ORDER — RANITIDINE HCL 150 MG PO TABS: 150.0000 mg | ORAL_TABLET | Freq: Two times a day (BID) | ORAL | Status: DC

## 2012-01-20 MED ORDER — OMEPRAZOLE 20 MG PO CPDR: 20.0000 mg | DELAYED_RELEASE_CAPSULE | Freq: Two times a day (BID) | ORAL | Status: DC

## 2012-01-20 NOTE — Assessment & Plan Note (Signed)
Also for nystatin powder asd

## 2012-01-20 NOTE — Assessment & Plan Note (Signed)

## 2012-01-20 NOTE — Assessment & Plan Note (Signed)
Sounds chronc, for Gi referral, may need EGD, also needs f/u screening colonoscopy,  Continue all other medications as before

## 2012-01-20 NOTE — Patient Instructions (Signed)
Please remember to followup with your GYN for the yearly pap smear and/or mammogram You will be contacted regarding the referral for: GI to consider upper endoscopy and screening colonoscopy Take all new medications as prescribed - the nystatin powder for the rash Continue all other medications as before; your refills were sent to the pharmacy as you requested Please call if your knee pain is worse, as you may need referral to orthopedic Please go to LAB in the Basement for the blood and/or urine tests to be done today You will be contacted by phone if any changes need to be made immediately.  Otherwise, you will receive a letter about your results with an explanation.

## 2012-01-20 NOTE — Assessment & Plan Note (Signed)
prob DJD related, Continue all other medications as before, consdier ortho referral

## 2012-01-21 ENCOUNTER — Telehealth: Payer: Self-pay

## 2012-01-21 MED ORDER — ATORVASTATIN CALCIUM 20 MG PO TABS: 20.0000 mg | ORAL_TABLET | Freq: Every day | ORAL | Status: DC

## 2012-01-21 NOTE — Telephone Encounter (Signed)
Sent prescription to [facility] [address]

## 2012-01-24 ENCOUNTER — Encounter: Payer: Self-pay | Admitting: Internal Medicine

## 2012-01-24 NOTE — Progress Notes (Signed)
Subjective:    Patient ID: [name], female    DOB: November 01, 1945, 66 y.o.   MRN: [id number]  HPI  Here for wellness and to estblish as new pt.  Overall doing ok;  Pt denies CP, worsening SOB, DOE, wheezing, orthopnea, PND, worsening LE edema, palpitations, dizziness or syncope.  Pt denies neurological change such as new Headache, facial or extremity weakness.  Pt denies polydipsia, polyuria, or low sugar symptoms. Pt states overall good compliance with treatment and medications, good tolerability, and trying to follow lower cholesterol diet.  Pt denies worsening depressive symptoms, suicidal ideation or panic. No fever, wt loss, night sweats, loss of appetite, or other constitutional symptoms.  Pt states good ability with ADL's, low fall risk, home safety reviewed and adequate, no significant changes in hearing or vision, and occasionally active with exercise. Does have rash to area below the left breast for several wks, itchy, nonpainful.   Has had mild worsening reflux, with mild ongoin dysphagia, but no abd pain, n/v, bowel change or blood.  Also has chronic knee pain, mild to mod, no recent falls or swelling. Past Medical History  Diagnosis Date  . HTN (hypertension) 01/14/2012  . Hyperlipidemia 01/14/2012  . Obesity 01/14/2012  . Osteoarthritis 01/14/2012  . DJD (degenerative joint disease), lumbar 01/14/2012  . Chronic headaches   . GERD (gastroesophageal reflux disease) 01/14/2012   Past Surgical History  Procedure Date  . Tubal ligation     reports that she has never smoked. She has never used smokeless tobacco. She reports that she does not drink alcohol or use illicit drugs. family history includes Diabetes in her other; Heart disease in her other; and Hypertension in her other. Allergies  Allergen Reactions  . Sulfonamide Derivatives    Current Outpatient Prescriptions on File Prior to Visit  Medication Sig Dispense Refill  . diltiazem (TIAZAC) 120 MG 24 hr capsule Take 1 capsule  (120 mg total) by mouth daily.  90 capsule  3  . fluticasone (FLONASE) 50 MCG/ACT nasal spray Place 2 sprays into the nose 2 (two) times daily.      Marland Kitchen levocetirizine (XYZAL) 5 MG tablet Take 5 mg by mouth every evening.      Marland Kitchen losartan-hydrochlorothiazide (HYZAAR) 100-25 MG per tablet Take 1 tablet by mouth daily.  90 tablet  3  . metoprolol (LOPRESSOR) 50 MG tablet Take 1 tablet (50 mg total) by mouth 2 (two) times daily.  180 tablet  3  . omeprazole (PRILOSEC) 20 MG capsule Take 1 capsule (20 mg total) by mouth 2 (two) times daily.  180 capsule  3  . ranitidine (ZANTAC) 150 MG tablet Take 1 tablet (150 mg total) by mouth 2 (two) times daily.  180 tablet  3  . atorvastatin (LIPITOR) 20 MG tablet Take 1 tablet (20 mg total) by mouth daily.  90 tablet  3   Review of Systems Review of Systems  Constitutional: Negative for diaphoresis, activity change, appetite change and unexpected weight change.  HENT: Negative for hearing loss, ear pain, facial swelling, mouth sores and neck stiffness.   Eyes: Negative for pain, redness and visual disturbance.  Respiratory: Negative for shortness of breath and wheezing.   Cardiovascular: Negative for chest pain and palpitations.  Gastrointestinal: Negative for diarrhea, blood in stool, abdominal distention and rectal pain.  Genitourinary: Negative for hematuria, flank pain and decreased urine volume.  Musculoskeletal: Negative for myalgias and joint swelling.  Skin: Negative for color change and wound.  Neurological: Negative for syncope  and numbness.  Hematological: Negative for adenopathy.  Psychiatric/Behavioral: Negative for hallucinations, self-injury, decreased concentration and agitation.      Objective:   Physical Exam BP 128/90  Pulse 64  Temp(Src) 97.7 F (36.5 C) (Oral)  Ht 5\' 3"  (1.6 m)  Wt 208 lb (94.348 kg)  BMI 36.85 kg/m2  SpO2 93% Physical Exam  VS noted Constitutional: Pt is oriented to person, place, and time. Appears  well-developed and well-nourished.  HENT:  Head: Normocephalic and atraumatic.  Right Ear: External ear normal.  Left Ear: External ear normal.  Nose: Nose normal.  Mouth/Throat: Oropharynx is clear and moist.  Eyes: Conjunctivae and EOM are normal. Pupils are equal, round, and reactive to light.  Neck: Normal range of motion. Neck supple. No JVD present. No tracheal deviation present.  Cardiovascular: Normal rate, regular rhythm, normal heart sounds and intact distal pulses.   Pulmonary/Chest: Effort normal and breath sounds normal.  Abdominal: Soft. Bowel sounds are normal. There is no tenderness.  Musculoskeletal: Normal range of motion. Exhibits no edema.  Lymphadenopathy:  Has no cervical adenopathy.  Neurological: Pt is alert and oriented to person, place, and time. Pt has normal reflexes. No cranial nerve deficit.  Skin: Skin is warm and dry. No rash noted. except for candidal type rash below left breast Psychiatric:  Has  normal mood and affect. Behavior is normal.     Assessment & Plan:

## 2012-02-01 ENCOUNTER — Ambulatory Visit: Payer: Self-pay | Admitting: Family Medicine

## 2012-02-05 ENCOUNTER — Ambulatory Visit: Payer: Self-pay | Admitting: Internal Medicine

## 2012-02-10 ENCOUNTER — Encounter: Payer: Self-pay | Admitting: Internal Medicine

## 2012-02-11 ENCOUNTER — Encounter: Payer: Self-pay | Admitting: Internal Medicine

## 2012-02-11 ENCOUNTER — Ambulatory Visit (INDEPENDENT_AMBULATORY_CARE_PROVIDER_SITE_OTHER): Payer: Medicare HMO | Admitting: Internal Medicine

## 2012-02-11 VITALS — BP 114/80 | HR 64 | Ht 63.0 in | Wt 204.8 lb

## 2012-02-11 DIAGNOSIS — R1310 Dysphagia, unspecified: Secondary | ICD-10-CM

## 2012-02-11 DIAGNOSIS — Z1211 Encounter for screening for malignant neoplasm of colon: Secondary | ICD-10-CM

## 2012-02-11 DIAGNOSIS — K219 Gastro-esophageal reflux disease without esophagitis: Secondary | ICD-10-CM

## 2012-02-11 DIAGNOSIS — R1314 Dysphagia, pharyngoesophageal phase: Secondary | ICD-10-CM

## 2012-02-11 MED ORDER — PEG-KCL-NACL-NASULF-NA ASC-C 100 G PO SOLR: 1.0000 | Freq: Once | ORAL | Status: DC

## 2012-02-11 MED ORDER — ESOMEPRAZOLE MAGNESIUM 40 MG PO CPDR: 40.0000 mg | DELAYED_RELEASE_CAPSULE | Freq: Every day | ORAL | Status: DC

## 2012-02-11 NOTE — Patient Instructions (Signed)
You have been scheduled for a colonoscopy/Endoscopu with Dil with propofol. Please follow written instructions given to you at your visit today.  Please pick up your prep kit at the pharmacy within the next 1-3 days.  Discontinue Omeprazole   We have sent the following medications to your pharmacy for you to pick up at your convenience: Nexium

## 2012-02-11 NOTE — Progress Notes (Signed)
Subjective:    Patient ID: [name], female    DOB: [birth year], 66 y.o.   MRN: [id number]  HPI Mrs. Bureau is a 66 yo female with PMH of hypertension, hyperlipidemia, obesity, osteoarthritis, GERD who seen in consultation at the request of Dr. Jenny Reichmann for evaluation of dysphagia. The patient reports a history of dysphagia for "years, but it does seemed to have worsened lately. She reports this is worse with solid foods, particularly meats. She reports it is almost never an issue with liquids. She denies a history of food impaction and she has not had to vomit up food. She usually forces this down with liquids. She does report heartburn, though not significant of late. She is on omeprazole 20 mg twice a day. She reports this is never controlled her GERD as well as Nexium did. She previously took Nexium. She reports intermittent nausea without vomiting. She reports a mild early satiety, but her appetite remains good. Overall her weight is stable, but she reports it fluctuates up and down. Her bowel movements have point, things have been "okay". She does report occasional constipation. She's had no bright red blood per rectum or melena. No abdominal pain. No fevers or chills.  She does report a history of an upper endoscopy several years ago. She also reports a remote colonoscopy, greater than 10 years ago.  Review of Systems As per history of present illness, otherwise negative except for allergy and sinus trouble, back pain, occasional cough, issues with sleep, and occasional sore throat  Patient Active Problem List  Diagnoses  . Palpitations  . Shortness of breath  . Preventative health care  . GERD (gastroesophageal reflux disease)  . HTN (hypertension)  . Hyperlipidemia  . Obesity  . Osteoarthritis  . DJD (degenerative joint disease), lumbar  . Dysphagia  . Rash  . Bilateral knee pain   Past Surgical History  Procedure Date  . Tubal ligation    Current Outpatient Prescriptions    Medication Sig Dispense Refill  . atorvastatin (LIPITOR) 20 MG tablet Take 1 tablet (20 mg total) by mouth daily.  90 tablet  3  . diltiazem (TIAZAC) 120 MG 24 hr capsule Take 1 capsule (120 mg total) by mouth daily.  90 capsule  3  . docusate sodium (COLACE) 100 MG capsule Take 100 mg by mouth daily.      . fluticasone (FLONASE) 50 MCG/ACT nasal spray Place 2 sprays into the nose 2 (two) times daily.      Marland Kitchen levocetirizine (XYZAL) 5 MG tablet Take 5 mg by mouth every evening.      Marland Kitchen losartan-hydrochlorothiazide (HYZAAR) 100-25 MG per tablet Take 1 tablet by mouth daily.  90 tablet  3  . metoprolol (LOPRESSOR) 50 MG tablet Take 1 tablet (50 mg total) by mouth 2 (two) times daily.  180 tablet  3  . naproxen (NAPROSYN) 500 MG tablet Take 500 mg by mouth as needed.      . nystatin (MYCOSTATIN) powder Use as directed twice per day to affected area  15 g  0  . omeprazole (PRILOSEC) 20 MG capsule Take 1 capsule (20 mg total) by mouth 2 (two) times daily.  180 capsule  3   Allergies  Allergen Reactions  . Sulfonamide Derivatives    Family History  Problem Relation Age of Onset  . Heart disease Mother   . Hypertension Mother   . Diabetes Brother    History  Substance Use Topics  . Smoking status: Never Smoker   .  Smokeless tobacco: Never Used  . Alcohol Use: No      Objective:   Physical Exam BP 114/80  Pulse 64  Ht 5\' 3"  (1.6 m)  Wt 204 lb 12.8 oz (92.897 kg)  BMI 36.28 kg/m2 Constitutional: Well-developed and well-nourished. No distress. HEENT: Normocephalic and atraumatic. Oropharynx is clear and moist. No oropharyngeal exudate. Conjunctivae are normal. Pupils are equal round and reactive to light. No scleral icterus. Neck: Neck supple. Trachea midline. Cardiovascular: Normal rate, regular rhythm and intact distal pulses. No M/R/G Pulmonary/chest: Effort normal and breath sounds normal. No wheezing, rales or rhonchi. Abdominal: Soft, nontender, nondistended. Bowel sounds active  throughout. There are no masses palpable. No hepatosplenomegaly. Extremities: no clubbing, cyanosis, or edema Lymphadenopathy: No cervical adenopathy noted. Neurological: Alert and oriented to person place and time. Skin: Skin is warm and dry. No rashes noted. Psychiatric: Normal mood and affect. Behavior is normal.  CBC    Component Value Date/Time   WBC 8.3 01/20/2012 1110   RBC 4.41 01/20/2012 1110   HGB 12.4 01/20/2012 1110   HCT 37.7 01/20/2012 1110   PLT 355.0 01/20/2012 1110   MCV 85.5 01/20/2012 1110   MCHC 33.0 01/20/2012 1110   RDW 14.4 01/20/2012 1110   LYMPHSABS 2.6 01/20/2012 1110   MONOABS 0.8 01/20/2012 1110   EOSABS 0.8* 01/20/2012 1110   BASOSABS 0.1 01/20/2012 1110    CMP     Component Value Date/Time   NA 140 01/20/2012 1110   K 3.7 01/20/2012 1110   CL 100 01/20/2012 1110   CO2 28 01/20/2012 1110   GLUCOSE 100* 01/20/2012 1110   BUN 17 01/20/2012 1110   CREATININE 0.7 01/20/2012 1110   CALCIUM 9.3 01/20/2012 1110   PROT 7.4 01/20/2012 1110   ALBUMIN 3.7 01/20/2012 1110   AST 21 01/20/2012 1110   ALT 18 01/20/2012 1110   ALKPHOS 90 01/20/2012 1110   BILITOT 0.4 01/20/2012 1110   Prior endoscopy, EGD 06/16/2005 Distal esophageal stricture, dilated to 15 mm with savory over a guidewire. Mild antral gastritis. (performed by Dr. Cletis Athens, MD)    Assessment & Plan:   66 yo female with PMH of hypertension, hyperlipidemia, obesity, osteoarthritis, GERD who seen in consultation at the request of Dr. Jenny Reichmann for evaluation of dysphagia  1. Esophageal dysphagia -- the patient does have a history of a distal esophageal stricture, likely reflux induced. I recommended repeat upper endoscopy with dilation. We discussed the risk and benefits of this test today and she is agreeable to proceed.  2. GERD -- her acid reflux disease is not completely controlled at present on omeprazole 40 mg daily. She reports a better response to Nexium, therefore I will discontinue omeprazole and start Nexium 40 mg daily. She is  reminded to take this 30 minutes to one hour before breakfast.  3. Colorectal cancer screening -- the patient is average risk for colorectal cancer, and has been greater than 10 years since her last colonoscopy. I've recommended this test today, and again we discussed its risks and benefits, and she is agreeable to proceed.  We discussed performing the EGD and colonoscopy on the same day, but the patient reports she prefers to do these on separate days. We discussed how this would be 2 visits with to sedations, and this remains her preference.

## 2012-02-12 ENCOUNTER — Telehealth: Payer: Self-pay | Admitting: Internal Medicine

## 2012-02-12 ENCOUNTER — Other Ambulatory Visit: Payer: Self-pay

## 2012-02-12 ENCOUNTER — Other Ambulatory Visit: Payer: Self-pay | Admitting: Gastroenterology

## 2012-02-12 MED ORDER — LOSARTAN POTASSIUM-HCTZ 100-25 MG PO TABS: 1.0000 | ORAL_TABLET | Freq: Every day | ORAL | Status: DC

## 2012-02-12 MED ORDER — DILTIAZEM HCL ER BEADS 120 MG PO CP24: 120.0000 mg | ORAL_CAPSULE | Freq: Every day | ORAL | Status: DC

## 2012-02-12 MED ORDER — METOPROLOL TARTRATE 50 MG PO TABS: 50.0000 mg | ORAL_TABLET | Freq: Two times a day (BID) | ORAL | Status: DC

## 2012-02-12 MED ORDER — ATORVASTATIN CALCIUM 20 MG PO TABS: 20.0000 mg | ORAL_TABLET | Freq: Every day | ORAL | Status: DC

## 2012-02-12 MED ORDER — PANTOPRAZOLE SODIUM 40 MG PO TBEC: 40.0000 mg | DELAYED_RELEASE_TABLET | Freq: Every day | ORAL | Status: DC

## 2012-02-12 NOTE — Telephone Encounter (Signed)
Sent it Pantoprazole to Pt's pharmacy.  Pharmacy will call pt.

## 2012-02-12 NOTE — Telephone Encounter (Signed)
If she has drug insurance, then pantoprazole 40 mg daily is a generic option that will be cheaper.

## 2012-03-03 ENCOUNTER — Encounter: Payer: Medicare HMO | Admitting: Internal Medicine

## 2012-03-10 ENCOUNTER — Encounter: Payer: Self-pay | Admitting: Internal Medicine

## 2012-03-10 ENCOUNTER — Ambulatory Visit (AMBULATORY_SURGERY_CENTER): Payer: Medicare HMO | Admitting: Internal Medicine

## 2012-03-10 VITALS — BP 140/96 | HR 75 | Temp 98.4°F | Resp 16 | Ht 63.0 in | Wt 204.0 lb

## 2012-03-10 DIAGNOSIS — D130 Benign neoplasm of esophagus: Secondary | ICD-10-CM

## 2012-03-10 DIAGNOSIS — D126 Benign neoplasm of colon, unspecified: Secondary | ICD-10-CM

## 2012-03-10 DIAGNOSIS — K209 Esophagitis, unspecified: Secondary | ICD-10-CM

## 2012-03-10 DIAGNOSIS — K2970 Gastritis, unspecified, without bleeding: Secondary | ICD-10-CM

## 2012-03-10 DIAGNOSIS — R1310 Dysphagia, unspecified: Secondary | ICD-10-CM

## 2012-03-10 DIAGNOSIS — R1314 Dysphagia, pharyngoesophageal phase: Secondary | ICD-10-CM

## 2012-03-10 DIAGNOSIS — K219 Gastro-esophageal reflux disease without esophagitis: Secondary | ICD-10-CM

## 2012-03-10 DIAGNOSIS — Z1211 Encounter for screening for malignant neoplasm of colon: Secondary | ICD-10-CM

## 2012-03-10 DIAGNOSIS — K635 Polyp of colon: Secondary | ICD-10-CM

## 2012-03-10 MED ORDER — SODIUM CHLORIDE 0.9 % IV SOLN: 500.0000 mL | INTRAVENOUS | Status: DC

## 2012-03-10 NOTE — Progress Notes (Signed)
Patient did not have preoperative order for IV antibiotic SSI prophylaxis. (G8918)  Patient did not experience any of the following events: a burn prior to discharge; a fall within the facility; wrong site/side/patient/procedure/implant event; or a hospital transfer or hospital admission upon discharge from the facility. (G8907)  

## 2012-03-10 NOTE — Patient Instructions (Signed)
Impressions/recommendations:  Hiatal Hernia (handout given) Gastritis (handout given) Dilation (post dilation diet given)  Avoid NSAIDS. Continue taking your PPI once daily. It is best to be taken 20-30 minutes before the morning meal. Return to clinic in 1 month to assess symptoms.  Polyp (handout given) Diverticulosis (handout given) High Fiber diet (handout given) Hemorrhoids (handout given)  Repeat colonoscopy dependent on pathology of polyp removed. You will receive a letter in the next 2-3 weeks with this information.  YOU HAD AN ENDOSCOPIC PROCEDURE TODAY AT [facility]: Refer to the procedure report that was given to you for any specific questions about what was found during the examination.  If the procedure report does not answer your questions, please call your gastroenterologist to clarify.  If you requested that your care partner not be given the details of your procedure findings, then the procedure report has been included in a sealed envelope for you to review at your convenience later.  YOU SHOULD EXPECT: Some feelings of bloating in the abdomen. Passage of more gas than usual.  Walking can help get rid of the air that was put into your GI tract during the procedure and reduce the bloating. If you had a lower endoscopy (such as a colonoscopy or flexible sigmoidoscopy) you may notice spotting of blood in your stool or on the toilet paper. If you underwent a bowel prep for your procedure, then you may not have a normal bowel movement for a few days.  DIET: Your first meal following the procedure should be a light meal and then it is ok to progress to your normal diet.  A half-sandwich or bowl of soup is an example of a good first meal.  Heavy or fried foods are harder to digest and may make you feel nauseous or bloated.  Likewise meals heavy in dairy and vegetables can cause extra gas to form and this can also increase the bloating.  Drink plenty of fluids but you  should avoid alcoholic beverages for 24 hours.  ACTIVITY: Your care partner should take you home directly after the procedure.  You should plan to take it easy, moving slowly for the rest of the day.  You can resume normal activity the day after the procedure however you should NOT DRIVE or use heavy machinery for 24 hours (because of the sedation medicines used during the test).    SYMPTOMS TO REPORT IMMEDIATELY: A gastroenterologist can be reached at any hour.  During normal business hours, 8:30 AM to 5:00 PM Monday through Friday, call [phone].  After hours and on weekends, please call the GI answering service at [phone] who will take a message and have the physician on call contact you.   Following lower endoscopy (colonoscopy or flexible sigmoidoscopy):  Excessive amounts of blood in the stool  Significant tenderness or worsening of abdominal pains  Swelling of the abdomen that is new, acute  Fever of 100F or higher  Following upper endoscopy (EGD)  Vomiting of blood or coffee ground material  New chest pain or pain under the shoulder blades  Painful or persistently difficult swallowing  New shortness of breath  Fever of 100F or higher  Black, tarry-looking stools  FOLLOW UP: If any biopsies were taken you will be contacted by phone or by letter within the next 1-3 weeks.  Call your gastroenterologist if you have not heard about the biopsies in 3 weeks.  Our staff will call the home number listed on your records  the next business day following your procedure to check on you and address any questions or concerns that you may have at that time regarding the information given to you following your procedure. This is a courtesy call and so if there is no answer at the home number and we have not heard from you through the emergency physician on call, we will assume that you have returned to your regular daily activities without incident.  SIGNATURES/CONFIDENTIALITY: You  and/or your care partner have signed paperwork which will be entered into your electronic medical record.  These signatures attest to the fact that that the information above on your After Visit Summary has been reviewed and is understood.  Full responsibility of the confidentiality of this discharge information lies with you and/or your care-partner.

## 2012-03-10 NOTE — Op Note (Signed)
[address] [facility] [address]. [place], [place]  [id number]  ENDOSCOPY PROCEDURE REPORT  PATIENT:  [name], [name]  MR#:  [id number] BIRTHDATE:  [birth year], 33 yrs. old  GENDER:  female ENDOSCOPIST:  Lajuan Lines. Jhanvi Drakeford, MD Referred by:  [name], M.D. PROCEDURE DATE:  03/10/2012 PROCEDURE:  EGD with biopsy, 43239, EGD with dilatation over guidewire ASA CLASS:  Class II INDICATIONS:  dysphagia MEDICATIONS:    MAC sedation, administered by CRNA, propofol (Diprivan) 200 mg IV TOPICAL ANESTHETIC:  Cetacaine Spray  DESCRIPTION OF PROCEDURE:   After the risks benefits and alternatives of the procedure were thoroughly explained, informed consent was obtained.  The [facility] GIF-H180 [id number] endoscope was introduced through the mouth and advanced to the second portion of the duodenum, without limitations.  The instrument was slowly withdrawn as the mucosa was fully examined. <<PROCEDUREIMAGES>>  Mildly tortuous in the distal esophagus.  Otherwise normal esophagus. Random biopsies were obtained and sent to pathology. Empiric dilation was performed with 68 Fr savary dilator over a guidewire  A small hiatal hernia was found.  Moderate gastritis with inflammatory polyps/nodular mucosa was found antrum. Biopsies of the antrum and body of the stomach were obtained and sent to pathology.  The duodenal bulb was normal in appearance, as was the postbulbar duodenum.    Retroflexed views revealed findings as previously described.    The scope was then withdrawn from the patient and the procedure completed.  COMPLICATIONS:  None  ENDOSCOPIC IMPRESSION: 1) Mild tortuous in the distal esophagus 2) Otherwise normal esophagus.  Random biopsies taken and empiric dilation to 16 mm performed. 3) Hiatal hernia 4) Moderate gastritis in the antrum.  Multiple biopsies obtained. 5) Normal duodenum  RECOMMENDATIONS: 1) Await pathology results 2) Avoid NSAIDs 3) Continue taking your PPI (antiacid  medicine) once daily. It is best to be taken 20-30 minutes prior to breakfast meal 4) Return to clinic in about 1 month to assess symptoms.  Lajuan Lines. Hilarie Fredrickson, MD  CC:  Biagio Borg, MD The Patient  n. eSIGNED:   Lajuan Lines. Hanne Kegg at 03/10/2012 12:09 PM  Colin Ina, [id number]

## 2012-03-10 NOTE — Op Note (Signed)
[facility]. [place], [place]  [id number]  COLONOSCOPY PROCEDURE REPORT  PATIENT:  Brandy, [name]  MR#:  [id number] BIRTHDATE:  12-Jan-1946, 31 yrs. old  GENDER:  female ENDOSCOPIST:  Lajuan Lines. Kiyona Mcnall, MD REF. BY:  [name], M.D. PROCEDURE DATE:  03/10/2012 PROCEDURE:  Colon with cold biopsy polypectomy ASA CLASS:  Class II INDICATIONS:  Routine Risk Screening, last colonoscopy > 10 years ago MEDICATIONS:   MAC sedation, administered by CRNA, propofol (Diprivan) 100 mg IV  DESCRIPTION OF PROCEDURE:   After the risks benefits and alternatives of the procedure were thoroughly explained, informed consent was obtained.  Digital rectal exam was performed and revealed no rectal masses.   The LB CF-[id number] endoscope was introduced through the anus and advanced to the terminal ileum which was intubated for a short distance, without limitations. The quality of the prep was good, using MoviPrep.  The instrument was then slowly withdrawn as the colon was fully examined. <<PROCEDUREIMAGES>>  FINDINGS:  The terminal ileum appeared normal.  A 2 mm sessile polyp was found in the cecum. The polyp was removed using cold biopsy forceps.  Mild diverticulosis was found in the left colon. Retroflexed views in the rectum revealed small internal hemorrhoids.    The scope was then withdrawn from the cecum and the procedure completed.  COMPLICATIONS:  None ENDOSCOPIC IMPRESSION: 1) Normal terminal ileum 2) Sessile polyp in the cecum. Removed and sent to pathology. 3) Mild diverticulosis in the left colon 4) Small internal hemorrhoids  RECOMMENDATIONS: 1) Await pathology results 2) High fiber diet. 3) If the polyp removed today is proven to be an adenomatous (pre-cancerous) polyp, you will need a repeat colonoscopy in 5 years. Otherwise you should continue to follow colorectal cancer screening guidelines for "routine risk" patients with colonoscopy in 10 years. You  will receive a letter within 1-2 weeks with the results of your biopsy as well as final recommendations. Please call my office if you have not received a letter after 3 weeks.  Lajuan Lines. Hilarie Fredrickson, MD  CC:  The Patient Biagio Borg, MD  n. Lorrin MaisLajuan Lines. Ennis Heavner at 03/10/2012 12:14 PM  Colin Ina, [id number]

## 2012-03-11 ENCOUNTER — Telehealth: Payer: Self-pay | Admitting: *Deleted

## 2012-03-11 NOTE — Telephone Encounter (Signed)
Telephone number given is nonworking.

## 2012-03-18 ENCOUNTER — Other Ambulatory Visit: Payer: Self-pay | Admitting: Internal Medicine

## 2012-03-18 ENCOUNTER — Encounter: Payer: Self-pay | Admitting: Internal Medicine

## 2012-03-18 ENCOUNTER — Ambulatory Visit (INDEPENDENT_AMBULATORY_CARE_PROVIDER_SITE_OTHER): Payer: Medicare HMO | Admitting: Internal Medicine

## 2012-03-18 VITALS — BP 110/80 | HR 67 | Temp 98.5°F | Ht 63.0 in | Wt 203.1 lb

## 2012-03-18 DIAGNOSIS — H9191 Unspecified hearing loss, right ear: Secondary | ICD-10-CM | POA: Insufficient documentation

## 2012-03-18 DIAGNOSIS — I10 Essential (primary) hypertension: Secondary | ICD-10-CM

## 2012-03-18 DIAGNOSIS — J302 Other seasonal allergic rhinitis: Secondary | ICD-10-CM | POA: Insufficient documentation

## 2012-03-18 DIAGNOSIS — J309 Allergic rhinitis, unspecified: Secondary | ICD-10-CM

## 2012-03-18 DIAGNOSIS — H9190 Unspecified hearing loss, unspecified ear: Secondary | ICD-10-CM

## 2012-03-18 MED ORDER — METHYLPREDNISOLONE ACETATE 80 MG/ML IJ SUSP
120.0000 mg | Freq: Once | INTRAMUSCULAR | Status: AC
  Administered 2012-03-18: 120 mg via INTRAMUSCULAR

## 2012-03-18 MED ORDER — METHYLPREDNISOLONE ACETATE 80 MG/ML IJ SUSP: 120.0000 mg | Freq: Once | INTRAMUSCULAR | Status: DC

## 2012-03-18 MED ORDER — FEXOFENADINE HCL 180 MG PO TABS: 180.0000 mg | ORAL_TABLET | Freq: Every day | ORAL | Status: DC

## 2012-03-18 NOTE — Assessment & Plan Note (Signed)
stable overall by hx and exam, most recent data reviewed with pt, and pt to continue medical treatment as before BP Readings from Last 3 Encounters:  03/18/12 110/80  03/10/12 140/96  02/11/12 114/80

## 2012-03-18 NOTE — Progress Notes (Signed)
Subjective:    Patient ID: [name], female    DOB: 1946/03/02, 66 y.o.   MRN: [id number]  HPI  Here with acute visit;  Does have several wks ongoing nasal allergy symptoms with clear congestion, itch and sneeze, without fever, pain, ST, cough or wheezing.  Also has had rather abrupt onset significantly worsening right hearing loss just in the past few days, without pain, dizziness, fever, d/c or blood.  Pt denies chest pain, increased sob or doe, wheezing, orthopnea, PND, increased LE swelling, palpitations, dizziness or syncope.  Pt denies new neurological symptoms such as new headache, or facial or extremity weakness or numbness   Pt denies polydipsia, polyuria. Past Medical History  Diagnosis Date  . HTN (hypertension) 01/14/2012  . Hyperlipidemia 01/14/2012  . Obesity 01/14/2012  . Osteoarthritis 01/14/2012  . DJD (degenerative joint disease), lumbar 01/14/2012  . Chronic headaches   . GERD (gastroesophageal reflux disease) 01/14/2012  . Allergic rhinitis, cause unspecified 03/18/2012   Past Surgical History  Procedure Date  . Tubal ligation     reports that she has never smoked. She has never used smokeless tobacco. She reports that she does not drink alcohol or use illicit drugs. family history includes Diabetes in her brother; Heart disease in her mother; and Hypertension in her mother.  There is no history of Colon cancer, and Esophageal cancer, and Rectal cancer, and Stomach cancer, . Allergies  Allergen Reactions  . Sulfonamide Derivatives    Current Outpatient Prescriptions on File Prior to Visit  Medication Sig Dispense Refill  . atorvastatin (LIPITOR) 20 MG tablet Take 1 tablet (20 mg total) by mouth daily.  90 tablet  3  . diltiazem (TIAZAC) 120 MG 24 hr capsule Take 1 capsule (120 mg total) by mouth daily.  90 capsule  3  . docusate sodium (COLACE) 100 MG capsule Take 100 mg by mouth daily.      Marland Kitchen esomeprazole (NEXIUM) 40 MG capsule Take 1 capsule (40 mg total) by mouth  daily.  30 capsule  1  . fluticasone (FLONASE) 50 MCG/ACT nasal spray Place 2 sprays into the nose 2 (two) times daily.      Marland Kitchen losartan-hydrochlorothiazide (HYZAAR) 100-25 MG per tablet Take 1 tablet by mouth daily.  90 tablet  3  . metoprolol (LOPRESSOR) 50 MG tablet Take 1 tablet (50 mg total) by mouth 2 (two) times daily.  180 tablet  3  . naproxen (NAPROSYN) 500 MG tablet Take 500 mg by mouth as needed.      . nystatin (MYCOSTATIN) powder Use as directed twice per day to affected area  15 g  0  . fexofenadine (ALLEGRA) 180 MG tablet Take 1 tablet (180 mg total) by mouth daily.  90 tablet  3   Current Facility-Administered Medications on File Prior to Visit  Medication Dose Route Frequency Provider Last Rate Last Dose  . 0.9 %  sodium chloride infusion  500 mL Intravenous Continuous Jerene Bears, MD       Review of Systems Review of Systems  Constitutional: Negative for diaphoresis and unexpected weight change.  HENT: Negative for drooling and tinnitus.   Eyes: Negative for photophobia and visual disturbance.  Respiratory: Negative for choking and stridor.   Gastrointestinal: Negative for vomiting and blood in stool.  Genitourinary: Negative for hematuria and decreased urine volume.  Musculoskeletal: Negative for gait problem.      Objective:   Physical Exam BP 110/80  Pulse 67  Temp(Src) 98.5 F (36.9 C) (Oral)  Ht 5\' 3"  (1.6 m)  Wt 203 lb 2 oz (92.137 kg)  BMI 35.98 kg/m2  SpO2 98% Physical Exam  VS noted Constitutional: Pt appears well-developed and well-nourished.  HENT: Head: Normocephalic.  Right Ear: External ear normal.  Right canal clear without swelling, redness after wax impaction removed with irrigation and hearing improved Left Ear: External ear normal. left canal clear Bilat tm's mild erythema.  Sinus nontender.  Pharynx mild erythema Eyes: Conjunctivae and EOM are normal. Pupils are equal, round, and reactive to light.  Neck: Normal range of motion. Neck  supple.  Cardiovascular: Normal rate and regular rhythm.   Pulmonary/Chest: Effort normal and breath sounds normal.  Neurological: Pt is alert. Not confused Skin: Skin is warm. No erythema.  Psychiatric: Pt behavior is normal. Thought content normal. not nervous or depressed affect    Assessment & Plan:

## 2012-03-18 NOTE — Patient Instructions (Signed)
Your right ear wax was irrigated today You had the steroid shot today - for the allergies Ok to stop the xyzal as you have Ok to start the allegra generic as needed for allergies Continue all other medications as before - including the flonase Please have the pharmacy call with any refills you may need.

## 2012-03-18 NOTE — Assessment & Plan Note (Signed)
Moderate, for depomedrol IM  Today, also change xyzal to allegra due to cost, cont the flonase asd,  to f/u any worsening symptoms or concerns

## 2012-03-18 NOTE — Assessment & Plan Note (Signed)
Improved s/p canal irrigation for wax impaction,  to f/u any worsening symptoms or concerns

## 2012-03-20 ENCOUNTER — Encounter: Payer: Self-pay | Admitting: Internal Medicine

## 2012-03-30 ENCOUNTER — Telehealth: Payer: Self-pay | Admitting: Internal Medicine

## 2012-03-30 NOTE — Telephone Encounter (Signed)
Pt c/o increased bloating, gas, and constipation. She was seen on 02/11/12 and an ECL was done on 03/10/12 and she was told to follow a hi fiber diet which she has not done.; I will mail her a diet. She also states she doesn't drink a lot of water and I gave the rationale of increasing water for softer stools.  Constipation was not her original complaint. Any Advice? Thanks.

## 2012-03-31 NOTE — Telephone Encounter (Signed)
Trial of Align 1 capsule daily for bloating, gas.  Hopefully this will help regulate digestion.  She can come pick up samples if she would like. If this helps, but not completely with constipation, then I would start Miralax 17 grams daily.  Not an overnight laxative.  She will need to take this daily to see real benefit. Thanks She can follow-up in July

## 2012-03-31 NOTE — Telephone Encounter (Signed)
Left a message for patient to call me. 

## 2012-04-01 NOTE — Telephone Encounter (Signed)
Spoke with patient and gave her Dr. Vena Rua recommendations. She states she will get the Align since she will not be over this way. She will keep her OV in July

## 2012-04-01 NOTE — Telephone Encounter (Signed)
Patient left a message to call her. Returned her call and patient was not available. Left a message that I called.

## 2012-04-14 ENCOUNTER — Encounter: Payer: Self-pay | Admitting: Internal Medicine

## 2012-04-15 ENCOUNTER — Encounter: Payer: Self-pay | Admitting: Internal Medicine

## 2012-04-15 ENCOUNTER — Ambulatory Visit (INDEPENDENT_AMBULATORY_CARE_PROVIDER_SITE_OTHER): Payer: Medicare HMO | Admitting: Internal Medicine

## 2012-04-15 VITALS — BP 130/70 | HR 80 | Ht 63.0 in | Wt 202.4 lb

## 2012-04-15 DIAGNOSIS — R002 Palpitations: Secondary | ICD-10-CM

## 2012-04-15 DIAGNOSIS — Z1211 Encounter for screening for malignant neoplasm of colon: Secondary | ICD-10-CM

## 2012-04-15 DIAGNOSIS — K219 Gastro-esophageal reflux disease without esophagitis: Secondary | ICD-10-CM

## 2012-04-15 DIAGNOSIS — R1314 Dysphagia, pharyngoesophageal phase: Secondary | ICD-10-CM

## 2012-04-15 DIAGNOSIS — R1310 Dysphagia, unspecified: Secondary | ICD-10-CM

## 2012-04-15 MED ORDER — LACTASE 3000 UNITS PO TABS: 2.0000 | ORAL_TABLET | Freq: Three times a day (TID) | ORAL | Status: DC

## 2012-04-15 MED ORDER — ESOMEPRAZOLE MAGNESIUM 40 MG PO CPDR: 40.0000 mg | DELAYED_RELEASE_CAPSULE | Freq: Every day | ORAL | Status: DC

## 2012-04-15 NOTE — Progress Notes (Signed)
Subjective:    Patient ID: [name], female    DOB: [birth year], 66 y.o.   MRN: [id number]  HPI 66 yo female with PMH of hypertension, hyperlipidemia, obesity, osteoarthritis, GERD who seen in followup after recent EGD and colonoscopy. She is alone today and reports she is doing well. She underwent upper endoscopy and colonoscopy on 03/10/2012. Her EGD revealed a mildly tortuous distal esophagus, but was otherwise unremarkable empiric dilation was performed to 16 mm using a savory dilator. She did have a small hiatus hernia and mild to moderate gastritis which was H. pylori dysplasia negative by biopsy.her exam and small bowel is unremarkable. The colonoscopy revealed very mild diverticular disease and a cecal adenoma which was removed.   Today she reports that her dysphasia symptoms have greatly improved. She is no longer having esophageal dysphagia. She's able to eat the foods that she wants. No odynophagia. She does report an intermittent fluttering in her chest. This is not a daily occurrence. She most associates this after eating, but this is inconsistent. This does not feel like her dysphagia which we discussed at our first visit. She's not having associated dyspnea, diaphoresis, or presyncopal symptoms. This is not necessarily exertional. Her bowel habits have returned to normal after colonoscopy. She's not having rectal bleeding or melena. She does report that fatty foods at times increase her gas and bloating, and she is also found this to be the case with milk and dairy products.  Review of Systems As per history of present illness, otherwise negative  Current Medications, Allergies, Past Medical History, Past Surgical History, Family History and Social History were reviewed in [employer] record.     Objective:   Physical Exam BP 130/70  Pulse 80  Ht 5\' 3"  (1.6 m)  Wt 202 lb 6.4 oz (91.808 kg)  BMI 35.85 kg/m2 Constitutional: Well-developed and  well-nourished. No distress. HEENT: Normocephalic and atraumatic. Oropharynx is clear and moist. No oropharyngeal exudate. Conjunctivae are normal. No scleral icterus. Neck: Neck supple. Trachea midline. Cardiovascular: Normal rate, regular rhythm and intact distal pulses. No M/R/G Pulmonary/chest: Effort normal and breath sounds normal. No wheezing, rales or rhonchi. Abdominal: Soft, obese, nontender, nondistended. Bowel sounds active throughout. There are no masses palpable. No hepatosplenomegaly. Extremities: no clubbing, cyanosis, or edema Lymphadenopathy: No cervical adenopathy noted. Neurological: Alert and oriented to person place and time. Psychiatric: Normal mood and affect. Behavior is normal.     Assessment & Plan:  66 yo female with PMH of hypertension, hyperlipidemia, obesity, osteoarthritis, GERD who seen in followup after recent EGD and colonoscopy  1. Esophageal dysphagia/mild H. Pylori neg gastritis -- she had a nice response to empiric esophageal dilation. We discussed that the durability of esophageal dilation can vary from months to years. If her dysphagia symptoms return we can repeat esophageal dilation. Her gastritis, which was H. pylori negative, is likely secondary to the use of naproxen. I recommend she continue her daily PPI for now. He is not having heartburn on her Nexium.  2. History of adenomatous colon polyps -- she had one adenomatous colon polyp, and will need repeat colonoscopy in 5 years. We discussed this today and she is aware of the recommendation  3. Gas and bloating -- it seems that her symptoms are worse with fatty foods, which are fairly easy for her to avoid. I recommended that she try over-the-counter Lactaid one to 2 tablets before dairy products. Perhaps she has mild lactose intolerance and this may help when she is  eating food such as milk or ice cream. I've asked that she contact us if the symptoms become worse or change in any concerning way.  4.  Chest fluttering -- she reports having a cardiology evaluation of the past including Holter monitor. She was told that she occasionally has an irregular heartbeat. I've asked that she contact her cardiologist to discuss this further, particularly if this continues to happen, happens more frequently, or is associated with chest pain or dyspnea. She voices understanding

## 2012-04-15 NOTE — Patient Instructions (Addendum)
Continue taking nexium daily.  We have sent the following medications to your pharmacy for you to pick up at your convenience: Lactaid, take as directed  If you continue to feel the flutter in your chest, please follow up with your cardiologist.  Follow up with Dr. Hilarie Fredrickson as needed

## 2012-04-20 ENCOUNTER — Other Ambulatory Visit: Payer: Self-pay | Admitting: Internal Medicine

## 2012-04-20 ENCOUNTER — Other Ambulatory Visit: Payer: Self-pay | Admitting: Gastroenterology

## 2012-04-20 DIAGNOSIS — R1310 Dysphagia, unspecified: Secondary | ICD-10-CM

## 2012-04-20 DIAGNOSIS — Z1211 Encounter for screening for malignant neoplasm of colon: Secondary | ICD-10-CM

## 2012-04-20 DIAGNOSIS — K219 Gastro-esophageal reflux disease without esophagitis: Secondary | ICD-10-CM

## 2012-04-25 ENCOUNTER — Other Ambulatory Visit: Payer: Self-pay | Admitting: Gastroenterology

## 2012-04-25 ENCOUNTER — Encounter: Payer: Self-pay | Admitting: Internal Medicine

## 2012-04-25 ENCOUNTER — Telehealth: Payer: Self-pay | Admitting: Internal Medicine

## 2012-04-25 ENCOUNTER — Ambulatory Visit (INDEPENDENT_AMBULATORY_CARE_PROVIDER_SITE_OTHER): Payer: Medicare HMO | Admitting: Internal Medicine

## 2012-04-25 VITALS — BP 110/72 | HR 67 | Temp 97.0°F | Ht 63.0 in | Wt 206.1 lb

## 2012-04-25 DIAGNOSIS — M48061 Spinal stenosis, lumbar region without neurogenic claudication: Secondary | ICD-10-CM | POA: Insufficient documentation

## 2012-04-25 DIAGNOSIS — IMO0002 Reserved for concepts with insufficient information to code with codable children: Secondary | ICD-10-CM

## 2012-04-25 DIAGNOSIS — K219 Gastro-esophageal reflux disease without esophagitis: Secondary | ICD-10-CM

## 2012-04-25 DIAGNOSIS — M5416 Radiculopathy, lumbar region: Secondary | ICD-10-CM | POA: Insufficient documentation

## 2012-04-25 DIAGNOSIS — I10 Essential (primary) hypertension: Secondary | ICD-10-CM

## 2012-04-25 DIAGNOSIS — Z1211 Encounter for screening for malignant neoplasm of colon: Secondary | ICD-10-CM

## 2012-04-25 DIAGNOSIS — R1310 Dysphagia, unspecified: Secondary | ICD-10-CM

## 2012-04-25 DIAGNOSIS — M5137 Other intervertebral disc degeneration, lumbosacral region: Secondary | ICD-10-CM

## 2012-04-25 DIAGNOSIS — M47816 Spondylosis without myelopathy or radiculopathy, lumbar region: Secondary | ICD-10-CM

## 2012-04-25 MED ORDER — TRAMADOL HCL 50 MG PO TABS: ORAL_TABLET | ORAL | Status: DC

## 2012-04-25 MED ORDER — ESOMEPRAZOLE MAGNESIUM 40 MG PO CPDR: 40.0000 mg | DELAYED_RELEASE_CAPSULE | Freq: Every day | ORAL | Status: DC

## 2012-04-25 MED ORDER — PREDNISONE 10 MG PO TABS: ORAL_TABLET | ORAL | Status: DC

## 2012-04-25 NOTE — Patient Instructions (Addendum)
Take all new medications as prescribed Continue all other medications as before Please return in 2-4 wks if not better

## 2012-04-25 NOTE — Telephone Encounter (Signed)
Sent in Nexium for pt. Pharm to call pt when ready

## 2012-04-25 NOTE — Assessment & Plan Note (Signed)
stable overall by hx and exam, most recent data reviewed with pt, and pt to continue medical treatment as before BP Readings from Last 3 Encounters:  04/25/12 110/72  04/15/12 130/70  03/18/12 110/80

## 2012-04-25 NOTE — Telephone Encounter (Signed)
Spoke to pt. Explained to her Dr. Hilarie Fredrickson is out of the office until Wednesday and I will leave her some samples at our front desk for her to pick up until Dr. Hilarie Fredrickson advises what else for her to take. Pt. Said she will be in today to pick them up

## 2012-04-25 NOTE — Assessment & Plan Note (Signed)
Mod to severe pain with exam intact, for pain control, predpack asd, consider MRI if pain persists or exam worsens,  to f/u any worsening symptoms or concerns

## 2012-04-25 NOTE — Assessment & Plan Note (Signed)
O/w stable overall by hx and exam, m and pt to continue medical treatment as before

## 2012-04-25 NOTE — Progress Notes (Signed)
Subjective:    Patient ID: [name], female    DOB: 12/07/1945, 66 y.o.   MRN: [id number]  HPI  Here to f/u with acute c/o; is right handed with no prior hx of significant back issues, now with 3 wks onset mild to mod right LBP with radiation to the distal RLE above the ankle, but no bowel or bladder change, fever, wt loss,  worsening LE numbness/weakness, gait change or falls.  Nothing makes better or worse.  Has had some minor similar fleeting pains in the past, but nothing constant and more severe like this episode.  Pt denies chest pain, increased sob or doe, wheezing, orthopnea, PND, increased LE swelling, palpitations, dizziness or syncope.  Pt denies new neurological symptoms such as new headache, or facial or extremity weakness or numbness.   Pt denies polydipsia, polyuria.   Pt denies fever, wt loss, night sweats, loss of appetite, or other constitutional symptoms Past Medical History  Diagnosis Date  . HTN (hypertension) 01/14/2012  . Hyperlipidemia 01/14/2012  . Obesity 01/14/2012  . Osteoarthritis 01/14/2012  . DJD (degenerative joint disease), lumbar 01/14/2012  . Chronic headaches   . GERD (gastroesophageal reflux disease) 01/14/2012  . Allergic rhinitis, cause unspecified 03/18/2012   Past Surgical History  Procedure Date  . Tubal ligation     reports that she has never smoked. She has never used smokeless tobacco. She reports that she does not drink alcohol or use illicit drugs. family history includes Diabetes in her brother; Heart disease in her mother; and Hypertension in her mother.  There is no history of Colon cancer, and Esophageal cancer, and Rectal cancer, and Stomach cancer, . Allergies  Allergen Reactions  . Sulfonamide Derivatives    Current Outpatient Prescriptions on File Prior to Visit  Medication Sig Dispense Refill  . atorvastatin (LIPITOR) 20 MG tablet Take 1 tablet (20 mg total) by mouth daily.  90 tablet  3  . diltiazem (TIAZAC) 120 MG 24 hr capsule  Take 1 capsule (120 mg total) by mouth daily.  90 capsule  3  . fexofenadine (ALLEGRA) 180 MG tablet Take 1 tablet (180 mg total) by mouth daily.  90 tablet  3  . fluticasone (FLONASE) 50 MCG/ACT nasal spray Place 2 sprays into the nose 2 (two) times daily.      Marland Kitchen lactase (LACTAID) 3000 UNITS tablet Take 2 tablets (6,000 Units total) by mouth 3 (three) times daily with meals.  60 tablet  6  . losartan-hydrochlorothiazide (HYZAAR) 100-25 MG per tablet Take 1 tablet by mouth daily.  90 tablet  3  . metoprolol (LOPRESSOR) 50 MG tablet Take 1 tablet (50 mg total) by mouth 2 (two) times daily.  180 tablet  3  . naproxen (NAPROSYN) 500 MG tablet Take 500 mg by mouth as needed.      . nystatin (MYCOSTATIN) powder Use as directed twice per day to affected area  15 g  0  . DISCONTD: esomeprazole (NEXIUM) 40 MG capsule Take 1 capsule (40 mg total) by mouth daily.  30 capsule  1   Current Facility-Administered Medications on File Prior to Visit  Medication Dose Route Frequency Provider Last Rate Last Dose  . 0.9 %  sodium chloride infusion  500 mL Intravenous Continuous Jerene Bears, MD      . methylPREDNISolone acetate (DEPO-MEDROL) injection 120 mg  120 mg Intramuscular Once Biagio Borg, MD       Review of Systems Review of Systems  Constitutional: Negative for diaphoresis  and unexpected weight change.  HENT: Negative for tinnitus.   Eyes: Negative for photophobia and visual disturbance.  Respiratory: Negative for choking and stridor.   Gastrointestinal: Negative for vomiting and blood in stool.  Genitourinary: Negative for hematuria and decreased urine volume.  Musculoskeletal: Negative for gait problem.  Skin: Negative for color change and wound.  Neurological: Negative for tremors and numbness.     Objective:   Physical Exam BP 110/72  Pulse 67  Temp 97 F (36.1 C) (Oral)  Ht 5\' 3"  (1.6 m)  Wt 206 lb 2 oz (93.498 kg)  BMI 36.51 kg/m2  SpO2 98% Physical Exam  VS noted Constitutional:  Pt appears well-developed and well-nourished.  HENT: Head: Normocephalic.  Right Ear: External ear normal.  Left Ear: External ear normal.  Eyes: Conjunctivae and EOM are normal. Pupils are equal, round, and reactive to light.  Neck: Normal range of motion. Neck supple.  Cardiovascular: Normal rate and regular rhythm.   Pulmonary/Chest: Effort normal and breath sounds normal.  Abd:  Soft, NT, non-distended, + BS Neurological: Pt is alert. Not confused.  Spine nontender,  Motor/dtr/sens/gait intact  Skin: Skin is warm. No erythema. No rash Psychiatric: Pt behavior is normal. Thought content normal. not depressed appearing    Assessment & Plan:

## 2012-05-02 ENCOUNTER — Other Ambulatory Visit: Payer: Self-pay | Admitting: Gastroenterology

## 2012-05-02 ENCOUNTER — Telehealth: Payer: Self-pay | Admitting: Internal Medicine

## 2012-05-02 MED ORDER — PANTOPRAZOLE SODIUM 40 MG PO TBEC: 40.0000 mg | DELAYED_RELEASE_TABLET | Freq: Every day | ORAL | Status: DC

## 2012-05-02 NOTE — Telephone Encounter (Signed)
Pt said that Nexium is to expensive and needs something cheaper.  Please call.

## 2012-05-02 NOTE — Telephone Encounter (Signed)
Spoke to pt and told her I was going to send her in Pantoprazole, she requested I send it to her mail order Pharm. [facility].

## 2012-05-16 ENCOUNTER — Other Ambulatory Visit: Payer: Self-pay

## 2012-05-16 MED ORDER — FLUTICASONE PROPIONATE 50 MCG/ACT NA SUSP: 2.0000 | Freq: Two times a day (BID) | NASAL | Status: DC

## 2012-05-24 ENCOUNTER — Telehealth: Payer: Self-pay

## 2012-05-24 NOTE — Telephone Encounter (Signed)
[employer] Denied PA on Fluticasone prop 50 mcg spray 96/90.  Informed the maximum dispensing limit for fluticasone is 93mL per 30 days.

## 2012-05-24 NOTE — Telephone Encounter (Signed)
But I did the rx for 90 days to [facility]  Maybe we should re-do the PA?

## 2012-05-26 NOTE — Telephone Encounter (Signed)
Unable to redo PA once completed first request.  Can appeal if you wish advise

## 2012-06-14 ENCOUNTER — Other Ambulatory Visit: Payer: Self-pay | Admitting: Internal Medicine

## 2012-06-14 DIAGNOSIS — Z1231 Encounter for screening mammogram for malignant neoplasm of breast: Secondary | ICD-10-CM

## 2012-06-24 ENCOUNTER — Encounter: Payer: Self-pay | Admitting: Internal Medicine

## 2012-06-24 ENCOUNTER — Ambulatory Visit (INDEPENDENT_AMBULATORY_CARE_PROVIDER_SITE_OTHER): Payer: Medicare HMO | Admitting: Internal Medicine

## 2012-06-24 VITALS — BP 122/70 | HR 71 | Temp 98.2°F | Ht 63.0 in | Wt 203.0 lb

## 2012-06-24 DIAGNOSIS — M7552 Bursitis of left shoulder: Secondary | ICD-10-CM

## 2012-06-24 DIAGNOSIS — M1990 Unspecified osteoarthritis, unspecified site: Secondary | ICD-10-CM

## 2012-06-24 DIAGNOSIS — I10 Essential (primary) hypertension: Secondary | ICD-10-CM

## 2012-06-24 DIAGNOSIS — M67919 Unspecified disorder of synovium and tendon, unspecified shoulder: Secondary | ICD-10-CM

## 2012-06-24 MED ORDER — DICLOFENAC SODIUM 75 MG PO TBEC: 75.0000 mg | DELAYED_RELEASE_TABLET | Freq: Two times a day (BID) | ORAL | Status: DC

## 2012-06-24 NOTE — Assessment & Plan Note (Signed)
Back, knees -  Not taking naprosyn regularly [place] for tramadol prn in addition to short term scheduled dose diclofenac

## 2012-06-24 NOTE — Assessment & Plan Note (Signed)
stable overall by hx and exam Short term NSAIDs for bursitis tx may cause fluid retention of BP elevation - pt to monitor and call if problems while on NSAIDs BP Readings from Last 3 Encounters:  06/24/12 122/70  04/25/12 110/72  04/15/12 130/70

## 2012-06-24 NOTE — Patient Instructions (Addendum)
It was good to see you today. Use diclofenac 75mg  2x/day with food for 1-2 weeks to help shoulder pain - Your prescription(s) have been submitted to your pharmacy. Please take as directed and contact our office if you believe you are having problem(s) with the medication(s). Ok to also use tramadol 50mg  if needed for uncontrolled pain (in addition to diclofenac) - but do not use diclofenac with naprosyn, aleve, ibuprofen (advil or motrin) - prescription or over the counter If unimproved or worse pain, call for shoulder injection as we discussed to help pain Bursitis Bursitis is when the fluid-filled sac (bursa) that covers and protects a joint gets puffy and irritated. The elbow, shoulder, hip, and knee joints are most often affected. HOME CARE  Put ice on the area.   Put ice in a plastic bag.   Place a towel between your skin and the bag.   Leave the ice on for 15 to 20 minutes, 3 to 4 times a day.   Put the joint through a full range of motion 4 times a day. Rest the injured joint at other times. When you have less pain, begin slow movements and usual activities.   Only take medicine as told by your doctor.   Follow up with your doctor. Any delay in care could stop the bursitis from healing. This could cause long-term pain.  GET HELP RIGHT AWAY IF:    You have more pain with treatment.   You have a temperature by mouth above 102 F (38.9 C), not controlled by medicine.   You have heat and irritation over the fluid-filled sac.  MAKE SURE YOU:    Understand these instructions.   Will watch your condition.   Will get help right away if you are not doing well or get worse.  Document Released: 03/25/2010 Document Revised: 09/24/2011 Document Reviewed: 03/25/2010 [facility] Patient Information 2012 [place]. Impingement Syndrome, Rotator Cuff, Bursitis with Rehab Impingement syndrome is a condition that involves inflammation of the tendons of the rotator cuff and the  subacromial bursa, that causes pain in the shoulder. The rotator cuff consists of four tendons and muscles that control much of the shoulder and upper arm function. The subacromial bursa is a fluid filled sac that helps reduce friction between the rotator cuff and one of the bones of the shoulder (acromion). Impingement syndrome is usually an overuse injury that causes swelling of the bursa (bursitis), swelling of the tendon (tendonitis), and/or a tear of the tendon (strain). Strains are classified into three categories. Grade 1 strains cause pain, but the tendon is not lengthened. Grade 2 strains include a lengthened ligament, due to the ligament being stretched or partially ruptured. With grade 2 strains there is still function, although the function may be decreased. Grade 3 strains include a complete tear of the tendon or muscle, and function is usually impaired. SYMPTOMS    Pain around the shoulder, often at the outer portion of the upper arm.   Pain that gets worse with shoulder function, especially when reaching overhead or lifting.   Sometimes, aching when not using the arm.   Pain that wakes you up at night.   Sometimes, tenderness, swelling, warmth, or redness over the affected area.   Loss of strength.   Limited motion of the shoulder, especially reaching behind the back (to the back pocket or to unhook bra) or across your body.   Crackling sound (crepitation) when moving the arm.   Biceps tendon pain and inflammation (in  the front of the shoulder). Worse when bending the elbow or lifting.  CAUSES   Impingement syndrome is often an overuse injury, in which chronic (repetitive) motions cause the tendons or bursa to become inflamed. A strain occurs when a force is paced on the tendon or muscle that is greater than it can withstand. Common mechanisms of injury include: Stress from sudden increase in duration, frequency, or intensity of training.  Direct hit (trauma) to the shoulder.     Aging, erosion of the tendon with normal use.   Bony bump on shoulder (acromial spur).  RISK INCREASES WITH:  Contact sports (football, wrestling, boxing).   Throwing sports (baseball, tennis, volleyball).   Weightlifting and bodybuilding.   Heavy labor.   Previous injury to the rotator cuff, including impingement.   Poor shoulder strength and flexibility.   Failure to warm up properly before activity.   Inadequate protective equipment.   Old age.   Bony bump on shoulder (acromial spur).  PREVENTION    Warm up and stretch properly before activity.   Allow for adequate recovery between workouts.   Maintain physical fitness:   Strength, flexibility, and endurance.   Cardiovascular fitness.   Learn and use proper exercise technique.  PROGNOSIS   If treated properly, impingement syndrome usually goes away within 6 weeks. Sometimes surgery is required.   RELATED COMPLICATIONS    Longer healing time if not properly treated, or if not given enough time to heal.   Recurring symptoms, that result in a chronic condition.   Shoulder stiffness, frozen shoulder, or loss of motion.   Rotator cuff tendon tear.   Recurring symptoms, especially if activity is resumed too soon, with overuse, with a direct blow, or when using poor technique.  TREATMENT   Treatment first involves the use of ice and medicine, to reduce pain and inflammation. The use of strengthening and stretching exercises may help reduce pain with activity. These exercises may be performed at home or with a therapist. If non-surgical treatment is unsuccessful after more than 6 months, surgery may be advised. After surgery and rehabilitation, activity is usually possible in 3 months.   MEDICATION  If pain medicine is needed, nonsteroidal anti-inflammatory medicines (aspirin and ibuprofen), or other minor pain relievers (acetaminophen), are often advised.   Do not take pain medicine for 7 days before surgery.    Prescription pain relievers may be given, if your caregiver thinks they are needed. Use only as directed and only as much as you need.   Corticosteroid injections may be given by your caregiver. These injections should be reserved for the most serious cases, because they may only be given a certain number of times.  HEAT AND COLD  Cold treatment (icing) should be applied for 10 to 15 minutes every 2 to 3 hours for inflammation and pain, and immediately after activity that aggravates your symptoms. Use ice packs or an ice massage.   Heat treatment may be used before performing stretching and strengthening activities prescribed by your caregiver, physical therapist, or athletic trainer. Use a heat pack or a warm water soak.  SEEK MEDICAL CARE IF:    Symptoms get worse or do not improve in 4 to 6 weeks, despite treatment.   New, unexplained symptoms develop. (Drugs used in treatment may produce side effects.)  EXERCISES   RANGE OF MOTION (ROM) AND STRETCHING EXERCISES - Impingement Syndrome (Rotator Cuff  Tendinitis, Bursitis) These exercises may help you when beginning to rehabilitate your injury. Your symptoms  may go away with or without further involvement from your physician, physical therapist or athletic trainer. While completing these exercises, remember:    Restoring tissue flexibility helps normal motion to return to the joints. This allows healthier, less painful movement and activity.   An effective stretch should be held for at least 30 seconds.   A stretch should never be painful. You should only feel a gentle lengthening or release in the stretched tissue.  STRETCH - Flexion, Standing  Stand with good posture. With an underhand grip on your right / left hand, and an overhand grip on the opposite hand, grasp a broomstick or cane so that your hands are a little more than shoulder width apart.   Keeping your right / left elbow straight and shoulder muscles relaxed, push the  stick with your opposite hand, to raise your right / left arm in front of your body and then overhead. Raise your arm until you feel a stretch in your right / left shoulder, but before you have increased shoulder pain.   Try to avoid shrugging your right / left shoulder as your arm rises, by keeping your shoulder blade tucked down and toward your mid-back spine. Hold for __________ seconds.   Slowly return to the starting position.  Repeat __________ times. Complete this exercise __________ times per day. STRETCH - Abduction, Supine  Lie on your back. With an underhand grip on your right / left hand and an overhand grip on the opposite hand, grasp a broomstick or cane so that your hands are a little more than shoulder width apart.   Keeping your right / left elbow straight and your shoulder muscles relaxed, push the stick with your opposite hand, to raise your right / left arm out to the side of your body and then overhead. Raise your arm until you feel a stretch in your right / left shoulder, but before you have increased shoulder pain.   Try to avoid shrugging your right / left shoulder as your arm rises, by keeping your shoulder blade tucked down and toward your mid-back spine. Hold for __________ seconds.   Slowly return to the starting position.  Repeat __________ times. Complete this exercise __________ times per day. ROM - Flexion, Active-Assisted  Lie on your back. You may bend your knees for comfort.   Grasp a broomstick or cane so your hands are about shoulder width apart. Your right / left hand should grip the end of the stick, so that your hand is positioned "thumbs-up," as if you were about to shake hands.   Using your healthy arm to lead, raise your right / left arm overhead, until you feel a gentle stretch in your shoulder. Hold for __________ seconds.   Use the stick to assist in returning your right / left arm to its starting position.  Repeat __________ times. Complete this  exercise __________ times per day.   ROM - Internal Rotation, Supine   Lie on your back on a firm surface. Place your right / left elbow about 60 degrees away from your side. Elevate your elbow with a folded towel, so that the elbow and shoulder are the same height.   Using a broomstick or cane and your strong arm, pull your right / left hand toward your body until you feel a gentle stretch, but no increase in your shoulder pain. Keep your shoulder and elbow in place throughout the exercise.   Hold for __________ seconds. Slowly return to the starting position.  Repeat __________ times. Complete this exercise __________ times per day. STRETCH - Internal Rotation  Place your right / left hand behind your back, palm up.   Throw a towel or belt over your opposite shoulder. Grasp the towel with your right / left hand.   While keeping an upright posture, gently pull up on the towel, until you feel a stretch in the front of your right / left shoulder.   Avoid shrugging your right / left shoulder as your arm rises, by keeping your shoulder blade tucked down and toward your mid-back spine.   Hold for __________ seconds. Release the stretch, by lowering your healthy hand.  Repeat __________ times. Complete this exercise __________ times per day. ROM - Internal Rotation   Using an underhand grip, grasp a stick behind your back with both hands.   While standing upright with good posture, slide the stick up your back until you feel a mild stretch in the front of your shoulder.   Hold for __________ seconds. Slowly return to your starting position.  Repeat __________ times. Complete this exercise __________ times per day.   STRETCH - Posterior Shoulder Capsule   Stand or sit with good posture. Grasp your right / left elbow and draw it across your chest, keeping it at the same height as your shoulder.   Pull your elbow, so your upper arm comes in closer to your chest. Pull until you feel a gentle  stretch in the back of your shoulder.   Hold for __________ seconds.  Repeat __________ times. Complete this exercise __________ times per day. STRENGTHENING EXERCISES - Impingement Syndrome (Rotator Cuff Tendinitis, Bursitis) These exercises may help you when beginning to rehabilitate your injury. They may resolve your symptoms with or without further involvement from your physician, physical therapist or athletic trainer. While completing these exercises, remember:  Muscles can gain both the endurance and the strength needed for everyday activities through controlled exercises.   Complete these exercises as instructed by your physician, physical therapist or athletic trainer. Increase the resistance and repetitions only as guided.   You may experience muscle soreness or fatigue, but the pain or discomfort you are trying to eliminate should never worsen during these exercises. If this pain does get worse, stop and make sure you are following the directions exactly. If the pain is still present after adjustments, discontinue the exercise until you can discuss the trouble with your clinician.   During your recovery, avoid activity or exercises which involve actions that place your injured hand or elbow above your head or behind your back or head. These positions stress the tissues which you are trying to heal.  STRENGTH - Scapular Depression and Adduction   With good posture, sit on a firm chair. Support your arms in front of you, with pillows, arm rests, or on a table top. Have your elbows in line with the sides of your body.   Gently draw your shoulder blades down and toward your mid-back spine. Gradually increase the tension, without tensing the muscles along the top of your shoulders and the back of your neck.   Hold for __________ seconds. Slowly release the tension and relax your muscles completely before starting the next repetition.   After you have practiced this exercise, remove the  arm support and complete the exercise in standing as well as sitting position.  Repeat __________ times. Complete this exercise __________ times per day.   STRENGTH - Shoulder Abductors, Isometric  With good posture, stand  or sit about 4-6 inches from a wall, with your right / left side facing the wall.   Bend your right / left elbow. Gently press your right / left elbow into the wall. Increase the pressure gradually, until you are pressing as hard as you can, without shrugging your shoulder or increasing any shoulder discomfort.   Hold for __________ seconds.   Release the tension slowly. Relax your shoulder muscles completely before you begin the next repetition.  Repeat __________ times. Complete this exercise __________ times per day.   STRENGTH - External Rotators, Isometric  Keep your right / left elbow at your side and bend it 90 degrees.   Step into a door frame so that the outside of your right / left wrist can press against the door frame without your upper arm leaving your side.   Gently press your right / left wrist into the door frame, as if you were trying to swing the back of your hand away from your stomach. Gradually increase the tension, until you are pressing as hard as you can, without shrugging your shoulder or increasing any shoulder discomfort.   Hold for __________ seconds.   Release the tension slowly. Relax your shoulder muscles completely before you begin the next repetition.  Repeat __________ times. Complete this exercise __________ times per day.   STRENGTH - Supraspinatus   Stand or sit with good posture. Grasp a __________ weight, or an exercise band or tubing, so that your hand is "thumbs-up," like you are shaking hands.   Slowly lift your right / left arm in a "V" away from your thigh, diagonally into the space between your side and straight ahead. Lift your hand to shoulder height or as far as you can, without increasing any shoulder pain. At first, many  people do not lift their hands above shoulder height.   Avoid shrugging your right / left shoulder as your arm rises, by keeping your shoulder blade tucked down and toward your mid-back spine.   Hold for __________ seconds. Control the descent of your hand, as you slowly return to your starting position.  Repeat __________ times. Complete this exercise __________ times per day.   STRENGTH - External Rotators  Secure a rubber exercise band or tubing to a fixed object (table, pole) so that it is at the same height as your right / left elbow when you are standing or sitting on a firm surface.   Stand or sit so that the secured exercise band is at your uninjured side.   Bend your right / left elbow 90 degrees. Place a folded towel or small pillow under your right / left arm, so that your elbow is a few inches away from your side.   Keeping the tension on the exercise band, pull it away from your body, as if pivoting on your elbow. Be sure to keep your body steady, so that the movement is coming only from your rotating shoulder.   Hold for __________ seconds. Release the tension in a controlled manner, as you return to the starting position.  Repeat __________ times. Complete this exercise __________ times per day.   STRENGTH - Internal Rotators   Secure a rubber exercise band or tubing to a fixed object (table, pole) so that it is at the same height as your right / left elbow when you are standing or sitting on a firm surface.   Stand or sit so that the secured exercise band is at your right /  left side.   Bend your elbow 90 degrees. Place a folded towel or small pillow under your right / left arm so that your elbow is a few inches away from your side.   Keeping the tension on the exercise band, pull it across your body, toward your stomach. Be sure to keep your body steady, so that the movement is coming only from your rotating shoulder.   Hold for __________ seconds. Release the tension in  a controlled manner, as you return to the starting position.  Repeat __________ times. Complete this exercise __________ times per day.   STRENGTH - Scapular Protractors, Standing   Stand arms length away from a wall. Place your hands on the wall, keeping your elbows straight.   Begin by dropping your shoulder blades down and toward your mid-back spine.   To strengthen your protractors, keep your shoulder blades down, but slide them forward on your rib cage. It will feel as if you are lifting the back of your rib cage away from the wall. This is a subtle motion and can be challenging to complete. Ask your caregiver for further instruction, if you are not sure you are doing the exercise correctly.   Hold for __________ seconds. Slowly return to the starting position, resting the muscles completely before starting the next repetition.  Repeat __________ times. Complete this exercise __________ times per day. STRENGTH - Scapular Protractors, Supine  Lie on your back on a firm surface. Extend your right / left arm straight into the air while holding a __________ weight in your hand.   Keeping your head and back in place, lift your shoulder off the floor.   Hold for __________ seconds. Slowly return to the starting position, and allow your muscles to relax completely before starting the next repetition.  Repeat __________ times. Complete this exercise __________ times per day. STRENGTH - Scapular Protractors, Quadruped  Get onto your hands and knees, with your shoulders directly over your hands (or as close as you can be, comfortably).   Keeping your elbows locked, lift the back of your rib cage up into your shoulder blades, so your mid-back rounds out. Keep your neck muscles relaxed.   Hold this position for __________ seconds. Slowly return to the starting position and allow your muscles to relax completely before starting the next repetition.  Repeat __________ times. Complete this exercise  __________ times per day.   STRENGTH - Scapular Retractors  Secure a rubber exercise band or tubing to a fixed object (table, pole), so that it is at the height of your shoulders when you are either standing, or sitting on a firm armless chair.   With a palm down grip, grasp an end of the band in each hand. Straighten your elbows and lift your hands straight in front of you, at shoulder height. Step back, away from the secured end of the band, until it becomes tense.   Squeezing your shoulder blades together, draw your elbows back toward your sides, as you bend them. Keep your upper arms lifted away from your body throughout the exercise.   Hold for __________ seconds. Slowly ease the tension on the band, as you reverse the directions and return to the starting position.  Repeat __________ times. Complete this exercise __________ times per day. STRENGTH - Shoulder Extensors   Secure a rubber exercise band or tubing to a fixed object (table, pole) so that it is at the height of your shoulders when you are either standing, or  sitting on a firm armless chair.   With a thumbs-up grip, grasp an end of the band in each hand. Straighten your elbows and lift your hands straight in front of you, at shoulder height. Step back, away from the secured end of the band, until it becomes tense.   Squeezing your shoulder blades together, pull your hands down to the sides of your thighs. Do not allow your hands to go behind you.   Hold for __________ seconds. Slowly ease the tension on the band, as you reverse the directions and return to the starting position.  Repeat __________ times. Complete this exercise __________ times per day.   STRENGTH - Scapular Retractors and External Rotators   Secure a rubber exercise band or tubing to a fixed object (table, pole) so that it is at the height as your shoulders, when you are either standing, or sitting on a firm armless chair.   With a palm down grip, grasp an end  of the band in each hand. Bend your elbows 90 degrees and lift your elbows to shoulder height, at your sides. Step back, away from the secured end of the band, until it becomes tense.   Squeezing your shoulder blades together, rotate your shoulders so that your upper arms and elbows remain stationary, but your fists travel upward to head height.   Hold for __________ seconds. Slowly ease the tension on the band, as you reverse the directions and return to the starting position.  Repeat __________ times. Complete this exercise __________ times per day.   STRENGTH - Scapular Retractors and External Rotators, Rowing   Secure a rubber exercise band or tubing to a fixed object (table, pole) so that it is at the height of your shoulders, when you are either standing, or sitting on a firm armless chair.   With a palm down grip, grasp an end of the band in each hand. Straighten your elbows and lift your hands straight in front of you, at shoulder height. Step back, away from the secured end of the band, until it becomes tense.   Step 1: Squeeze your shoulder blades together. Bending your elbows, draw your hands to your chest, as if you are rowing a boat. At the end of this motion, your hands and elbow should be at shoulder height and your elbows should be out to your sides.   Step 2: Rotate your shoulders, to raise your hands above your head. Your forearms should be vertical and your upper arms should be horizontal.   Hold for __________ seconds. Slowly ease the tension on the band, as you reverse the directions and return to the starting position.  Repeat __________ times. Complete this exercise __________ times per day.   STRENGTH - Scapular Depressors  Find a sturdy chair without wheels, such as a dining room chair.   Keeping your feet on the floor, and your hands on the chair arms, lift your bottom up from the seat, and lock your elbows.   Keeping your elbows straight, allow gravity to pull your  body weight down. Your shoulders will rise toward your ears.   Raise your body against gravity by drawing your shoulder blades down your back, shortening the distance between your shoulders and ears. Although your feet should always maintain contact with the floor, your feet should progressively support less body weight, as you get stronger.   Hold for __________ seconds. In a controlled and slow manner, lower your body weight to begin the next repetition.  Repeat __________ times. Complete this exercise __________ times per day.   Document Released: 10/05/2005 Document Revised: 09/24/2011 Document Reviewed: 01/17/2009 [facility] Patient Information 2012 [place].

## 2012-06-24 NOTE — Progress Notes (Signed)
  Subjective:    Patient ID: [name], female    DOB: [birth year], 66 y.o.   MRN: [id number]  HPI  complains of left shoulder and upper arm pain Onset 2 days ago Precipitated by overuse in past week No prior hx arm/shoulder pain No injury or fall Worse with direct pressure such as lying in bed on L side  Past Medical History  Diagnosis Date  . HTN (hypertension)   . Hyperlipidemia   . Obesity   . Osteoarthritis   . DJD (degenerative joint disease), lumbar   . Chronic headaches   . GERD (gastroesophageal reflux disease)   . Allergic rhinitis, cause unspecified     Review of Systems  HENT: Negative for neck pain and neck stiffness.   Respiratory: Negative for cough and shortness of breath.   Cardiovascular: Negative for chest pain, palpitations and leg swelling.  Musculoskeletal: Positive for back pain. Negative for joint swelling.       Objective:   Physical Exam BP 122/70  Pulse 71  Temp 98.2 F (36.8 C) (Oral)  Ht 5\' 3"  (1.6 m)  Wt 203 lb (92.08 kg)  BMI 35.96 kg/m2  SpO2 98% Wt Readings from Last 3 Encounters:  06/24/12 203 lb (92.08 kg)  04/25/12 206 lb 2 oz (93.498 kg)  04/15/12 202 lb 6.4 oz (91.808 kg)   Constitutional: She appears well-developed and well-nourished. No distress. Neck: Normal range of motion. Neck supple. No JVD present. No thyromegaly present.  Cardiovascular: Normal rate, regular rhythm and normal heart sounds.  No murmur heard. No BLE edema. Pulmonary/Chest: Effort normal and breath sounds normal. No respiratory distress. She has no wheezes. Musculoskeletal: L Shoulder: Full range of motion. Neurovascularly intact distally. Good strength with stress of rotator cuff but causes pain. Positive impingement signs. Skin: Skin is warm and dry. No rash noted. No erythema.  Psychiatric: She has a normal mood and affect. Her behavior is normal. Judgment and thought content normal.   Lab Results  Component Value Date   WBC 8.3 01/20/2012   HGB  12.4 01/20/2012   HCT 37.7 01/20/2012   PLT 355.0 01/20/2012   GLUCOSE 100* 01/20/2012   CHOL 264* 01/20/2012   TRIG 210.0* 01/20/2012   HDL 63.50 01/20/2012   LDLDIRECT 168.8 01/20/2012   LDLCALC 91 11/28/2010   ALT 18 01/20/2012   AST 21 01/20/2012   NA 140 01/20/2012   K 3.7 01/20/2012   CL 100 01/20/2012   CREATININE 0.7 01/20/2012   BUN 17 01/20/2012   CO2 28 01/20/2012   TSH 0.70 01/20/2012       Assessment & Plan:   L shoulder impingement from bursitis - precipitated by overuse (picking up 65 yo g-gson over holiday week)  tx with NSAIDs x 10 days, then prn - avoid use with other NSAIDs but ok with tramadol prn Exercises and education provided - If unimproved, consider steroid injection

## 2012-07-04 ENCOUNTER — Telehealth: Payer: Self-pay | Admitting: Internal Medicine

## 2012-07-05 NOTE — Telephone Encounter (Signed)
Pt reports the protonix BID is not helping her reflux. She reports burning in her stomach, diarrhea and a decreased appetite and she can't afford the Nexium.  Called [employer] since she has failed prilosec and protonix. They will fax a prior auth form. Prior Auth for pharmacy 800 [phone]

## 2012-07-06 ENCOUNTER — Telehealth: Payer: Self-pay | Admitting: *Deleted

## 2012-07-06 MED ORDER — PANTOPRAZOLE SODIUM 40 MG PO TBEC: 40.0000 mg | DELAYED_RELEASE_TABLET | Freq: Every day | ORAL | Status: DC

## 2012-07-06 NOTE — Telephone Encounter (Signed)
Pt reports that [facility] faxed request for "something for her stomach" medication but she would now like to request to have Rx sent to local pharmacy [[facility] [address]], "if you were going to px her a medication "/SLS

## 2012-07-06 NOTE — Telephone Encounter (Signed)
protonix also sent to [facility]

## 2012-07-07 MED ORDER — ESOMEPRAZOLE MAGNESIUM 40 MG PO CPDR: 40.0000 mg | DELAYED_RELEASE_CAPSULE | Freq: Two times a day (BID) | ORAL | Status: DC

## 2012-07-07 NOTE — Telephone Encounter (Signed)
Never received the fax for Prior auth; called [employer] [phone] and did verbal prior auth over the phone for approval of BID Nexium. The rep stated I should receive a reply via fax within 24-72 hours.

## 2012-07-07 NOTE — Telephone Encounter (Signed)
Informed pt I got approval from [facility]  for Nexium BID; I do not know the cost. She will call if too expensive.

## 2012-07-07 NOTE — Telephone Encounter (Signed)
Called and informed the patient prescription requested was sent to local per pt. Request.

## 2012-07-19 ENCOUNTER — Ambulatory Visit (HOSPITAL_COMMUNITY): Payer: Medicare HMO

## 2012-08-05 ENCOUNTER — Ambulatory Visit (HOSPITAL_COMMUNITY): Payer: Medicare HMO

## 2012-09-19 ENCOUNTER — Ambulatory Visit (HOSPITAL_COMMUNITY): Payer: Medicare HMO

## 2012-09-30 ENCOUNTER — Encounter: Payer: Self-pay | Admitting: Internal Medicine

## 2012-09-30 ENCOUNTER — Ambulatory Visit (INDEPENDENT_AMBULATORY_CARE_PROVIDER_SITE_OTHER): Payer: Medicare HMO | Admitting: Internal Medicine

## 2012-09-30 VITALS — BP 124/74 | HR 66 | Temp 98.0°F | Ht 63.0 in | Wt 207.4 lb

## 2012-09-30 DIAGNOSIS — M1990 Unspecified osteoarthritis, unspecified site: Secondary | ICD-10-CM

## 2012-09-30 DIAGNOSIS — M129 Arthropathy, unspecified: Secondary | ICD-10-CM

## 2012-09-30 DIAGNOSIS — J309 Allergic rhinitis, unspecified: Secondary | ICD-10-CM

## 2012-09-30 DIAGNOSIS — Z23 Encounter for immunization: Secondary | ICD-10-CM

## 2012-09-30 MED ORDER — FLUTICASONE PROPIONATE 50 MCG/ACT NA SUSP: 2.0000 | Freq: Every day | NASAL | Status: DC

## 2012-09-30 NOTE — Patient Instructions (Addendum)
Allergic Rhinitis  Allergic rhinitis is when the mucous membranes in the nose respond to allergens. Allergens are particles in the air that cause your body to have an allergic reaction. This causes you to release allergic antibodies. Through a chain of events, these eventually cause you to release histamine into the blood stream (hence the use of antihistamines). Although meant to be protective to the body, it is this release that causes your discomfort, such as frequent sneezing, congestion and an itchy runny nose.    CAUSES    The pollen allergens may come from grasses, trees, and weeds. This is seasonal allergic rhinitis, or "hay fever." Other allergens cause year-round allergic rhinitis (perennial allergic rhinitis) such as house dust mite allergen, pet dander and mold spores.    SYMPTOMS     Nasal stuffiness (congestion).   Runny, itchy nose with sneezing and tearing of the eyes.   There is often an itching of the mouth, eyes and ears.  It cannot be cured, but it can be controlled with medications.  DIAGNOSIS    If you are unable to determine the offending allergen, skin or blood testing may find it.  TREATMENT     Avoid the allergen.   Medications and allergy shots (immunotherapy) can help.   Hay fever may often be treated with antihistamines in pill or nasal spray forms. Antihistamines block the effects of histamine. There are over-the-counter medicines that may help with nasal congestion and swelling around the eyes. Check with your caregiver before taking or giving this medicine.  If the treatment above does not work, there are many new medications your caregiver can prescribe. Stronger medications may be used if initial measures are ineffective. Desensitizing injections can be used if medications and avoidance fails. Desensitization is when a patient is given ongoing shots until the body becomes less sensitive to the allergen. Make sure you follow up with your caregiver if problems continue.   SEEK MEDICAL CARE IF:     You develop fever (more than 100.5 F (38.1 C).   You develop a cough that does not stop easily (persistent).   You have shortness of breath.   You start wheezing.   Symptoms interfere with normal daily activities.  Document Released: 06/30/2001 Document Revised: 12/28/2011 Document Reviewed: 01/09/2009  [employer] Patient Information 2013 [employer].

## 2012-09-30 NOTE — Progress Notes (Signed)
Subjective:    Patient ID: [name], female    DOB: 04-19-1946, 66 y.o.   MRN: [id number]  HPI  Pt presents to the clinic today with c/o left ear fullness. This has been going on for about a month. She does have a history of allergies and intermittently take allegra, but has not taken it recently. She denies ear pain., ear drainage or fever. She does have pets that live inside. Additionally, she is complaining of bilateral knee pain and back pain. She has a history of arthritis. She feels like it is worse since the wether turned cold and with it raining outside. She has been taking Mobic for the arthritis pain which helps, but she ran out about a month ago and is requesting a refill.  Review of Systems      Past Medical History  Diagnosis Date  . HTN (hypertension)   . Hyperlipidemia   . Obesity   . Osteoarthritis   . DJD (degenerative joint disease), lumbar   . Chronic headaches   . GERD (gastroesophageal reflux disease)   . Allergic rhinitis, cause unspecified     Current Outpatient Prescriptions  Medication Sig Dispense Refill  . atorvastatin (LIPITOR) 20 MG tablet Take 1 tablet (20 mg total) by mouth daily.  90 tablet  3  . diclofenac (VOLTAREN) 75 MG EC tablet Take 1 tablet (75 mg total) by mouth 2 (two) times daily.  30 tablet  0  . diltiazem (TIAZAC) 120 MG 24 hr capsule Take 1 capsule (120 mg total) by mouth daily.  90 capsule  3  . esomeprazole (NEXIUM) 40 MG capsule Take 1 capsule (40 mg total) by mouth 2 (two) times daily.  60 capsule  11  . fexofenadine (ALLEGRA) 180 MG tablet Take 1 tablet (180 mg total) by mouth daily.  90 tablet  3  . lactase (LACTAID) 3000 UNITS tablet Take 2 tablets (6,000 Units total) by mouth 3 (three) times daily with meals.  60 tablet  6  . losartan-hydrochlorothiazide (HYZAAR) 100-25 MG per tablet Take 1 tablet by mouth daily.  90 tablet  3  . metoprolol (LOPRESSOR) 50 MG tablet Take 1 tablet (50 mg total) by mouth 2 (two) times daily.   180 tablet  3  . nystatin (MYCOSTATIN) powder Use as directed twice per day to affected area  15 g  0  . traMADol (ULTRAM) 50 MG tablet 1-2 tabs by mouth every 6 hours as needed for pain  100 tablet  1  . fluticasone (FLONASE) 50 MCG/ACT nasal spray Place 2 sprays into the nose daily.  16 g  2    Allergies  Allergen Reactions  . Sulfonamide Derivatives     Family History  Problem Relation Age of Onset  . Heart disease Mother   . Hypertension Mother   . Diabetes Brother   . Colon cancer Neg Hx   . Esophageal cancer Neg Hx   . Rectal cancer Neg Hx   . Stomach cancer Neg Hx     History   Social History  . Marital Status: Widowed    Spouse Name: N/A    Number of Children: 3  . Years of Education: 12   Occupational History  . retired    Social History Main Topics  . Smoking status: Never Smoker   . Smokeless tobacco: Never Used  . Alcohol Use: No  . Drug Use: No  . Sexually Active: Not on file   Other Topics Concern  . Not on  file   Social History Narrative  . No narrative on file     Constitutional: Denies fever, malaise, fatigue, headache or abrupt weight changes.  HEENT: Pt reports fullness in the left ear. Denies eye pain, eye redness, ear pain, ringing in the ears, wax buildup, runny nose, nasal congestion, bloody nose, or sore throat. Respiratory: Denies difficulty breathing, shortness of breath, cough or sputum production.   Cardiovascular: Denies chest pain, chest tightness, palpitations or swelling in the hands or feet.   Musculoskeletal: Pt reports bilateral knee pain and back pain. Denies decrease in range of motion, difficulty with gait, muscle pain or joint swelling.  Neurological: Denies numbness or tingling in the extremities, dizziness, difficulty with memory, difficulty with speech or problems with balance and coordination.   No other specific complaints in a complete review of systems (except as listed in HPI above).  Objective:   Physical  Exam  BP 124/74  Pulse 66  Temp 98 F (36.7 C) (Oral)  Ht 5\' 3"  (1.6 m)  Wt 207 lb 6.4 oz (94.076 kg)  BMI 36.74 kg/m2  SpO2 93% Wt Readings from Last 3 Encounters:  09/30/12 207 lb 6.4 oz (94.076 kg)  06/24/12 203 lb (92.08 kg)  04/25/12 206 lb 2 oz (93.498 kg)    General: Appears her stated age, obese but well developed, well nourished in NAD. Skin: Warm, dry and intact. No rashes, lesions or ulcerations noted. HEENT: Head: normal shape and size; Eyes: sclera white, no icterus, conjunctiva pink, PERRLA and EOMs intact; Ears: Tm's gray and intact, normal light reflex, fluid noted behind the eardrums. Nose: mucosa pink and moist, septum midline; Throat/Mouth: Teeth present, mucosa pink and moist, no exudate, lesions or ulcerations noted.   Cardiovascular: Normal rate and rhythm. S1,S2 noted.  No murmur, rubs or gallops noted. No JVD or BLE edema. No carotid bruits noted. Pulmonary/Chest: Normal effort and positive vesicular breath sounds. No respiratory distress. No wheezes, rales or ronchi noted.  Musculoskeletal: Crepitus with bilateral knee range of motion. No signs of joint swelling. No difficulty with gait.  Neurological: Alert and oriented. Cranial nerves II-XII intact. Coordination normal. +DTRs bilaterally.      Assessment & Plan:   Allergic Rhinitis, new onset with additional workup required:  Take Allegra daily eRx given for Flonase to be taken daily  Arthritis, new onset with additional workup required:  Refilled Mobic to be taken Daily  RTC as needed or if symptoms persist

## 2012-10-10 ENCOUNTER — Ambulatory Visit (HOSPITAL_COMMUNITY)
Admission: RE | Admit: 2012-10-10 | Discharge: 2012-10-10 | Disposition: A | Discharge status: Home / Self Care | Payer: Medicare HMO | Source: Ambulatory Visit | Attending: Internal Medicine | Admitting: Internal Medicine

## 2012-10-10 DIAGNOSIS — Z1231 Encounter for screening mammogram for malignant neoplasm of breast: Secondary | ICD-10-CM

## 2012-10-10 LAB — HM MAMMOGRAPHY: HM Mammogram: NEGATIVE

## 2012-11-25 ENCOUNTER — Ambulatory Visit (INDEPENDENT_AMBULATORY_CARE_PROVIDER_SITE_OTHER): Payer: Medicare HMO | Admitting: Internal Medicine

## 2012-11-25 ENCOUNTER — Encounter: Payer: Self-pay | Admitting: Internal Medicine

## 2012-11-25 VITALS — BP 124/80 | HR 61 | Temp 97.1°F | Ht 63.0 in | Wt 206.5 lb

## 2012-11-25 DIAGNOSIS — I10 Essential (primary) hypertension: Secondary | ICD-10-CM

## 2012-11-25 DIAGNOSIS — R21 Rash and other nonspecific skin eruption: Secondary | ICD-10-CM

## 2012-11-25 DIAGNOSIS — E785 Hyperlipidemia, unspecified: Secondary | ICD-10-CM

## 2012-11-25 DIAGNOSIS — J069 Acute upper respiratory infection, unspecified: Secondary | ICD-10-CM

## 2012-11-25 MED ORDER — DOXYCYCLINE HYCLATE 100 MG PO TABS: 100.0000 mg | ORAL_TABLET | Freq: Two times a day (BID) | ORAL | Status: DC

## 2012-11-25 MED ORDER — TRIAMCINOLONE ACETONIDE 0.1 % EX CREA: TOPICAL_CREAM | Freq: Two times a day (BID) | CUTANEOUS | Status: DC

## 2012-11-25 NOTE — Assessment & Plan Note (Addendum)
ECG reviewed as per emr, stable overall by history and exam, recent data reviewed with pt, and pt to continue medical treatment as before,  to f/u any worsening symptoms or concerns BP Readings from Last 3 Encounters:  11/25/12 124/80  09/30/12 124/74  06/24/12 122/70

## 2012-11-25 NOTE — Patient Instructions (Addendum)
Please take all new medication as prescribed- the antibiotic and the cream Please continue all other medications as before Please continue your efforts at being more active, low cholesterol diet, and weight control. Thank you for enrolling in [facility]. Please follow the instructions below to securely access your online medical record. MyChart allows you to send messages to your doctor, view your test results, renew your prescriptions, schedule appointments, and more. To Log into My Chart online, please go by [employer] or [employer] to [email or web address], or download the MyChart App from the [employer].  Your Username is: [email or web address] ([email or web address]) Please return in April 2014, or sooner if needed, with Lab testing done 3-5 days before

## 2012-11-26 ENCOUNTER — Encounter: Payer: Self-pay | Admitting: Internal Medicine

## 2012-11-26 DIAGNOSIS — J069 Acute upper respiratory infection, unspecified: Secondary | ICD-10-CM | POA: Insufficient documentation

## 2012-11-26 NOTE — Assessment & Plan Note (Signed)
Mild to mod, for antibx course,  to f/u any worsening symptoms or concerns 

## 2012-11-26 NOTE — Assessment & Plan Note (Signed)
Lab Results  Component Value Date   LDLCALC 91 11/28/2010   stable overall by history and exam, recent data reviewed with pt, and pt to continue medical treatment as before,  to f/u any worsening symptoms or concerns, to cont lower chol diet

## 2012-11-26 NOTE — Progress Notes (Signed)
Subjective:    Patient ID: [name], female    DOB: 25-Jan-1946, 67 y.o.   MRN: [id number]  HPI   Here with 2-3 days acute onset fever, facial pain, pressure, headache, general weakness and malaise, and greenish d/c, with mild ST and cough, but pt denies chest pain, wheezing, increased sob or doe, orthopnea, PND, increased LE swelling, palpitations, dizziness or syncope.  Pt denies new neurological symptoms such as new headache, or facial or extremity weakness or numbness   Pt denies polydipsia, polyuria, has not been trying to follow lower chol diet recently,  Also had cracking itchy rash to both hands off and on for several months, now only really occuring to the left hand this wk.   Past Medical History  Diagnosis Date  . HTN (hypertension)   . Hyperlipidemia   . Obesity   . Osteoarthritis   . DJD (degenerative joint disease), lumbar   . Chronic headaches   . GERD (gastroesophageal reflux disease)   . Allergic rhinitis, cause unspecified    Past Surgical History  Procedure Laterality Date  . Tubal ligation      reports that she has never smoked. She has never used smokeless tobacco. She reports that she does not drink alcohol or use illicit drugs. family history includes Diabetes in her brother; Heart disease in her mother; and Hypertension in her mother.  There is no history of Colon cancer, and Esophageal cancer, and Rectal cancer, and Stomach cancer, . Allergies  Allergen Reactions  . Sulfonamide Derivatives    Current Outpatient Prescriptions on File Prior to Visit  Medication Sig Dispense Refill  . atorvastatin (LIPITOR) 20 MG tablet Take 1 tablet (20 mg total) by mouth daily.  90 tablet  3  . diclofenac (VOLTAREN) 75 MG EC tablet Take 1 tablet (75 mg total) by mouth 2 (two) times daily.  30 tablet  0  . diltiazem (TIAZAC) 120 MG 24 hr capsule Take 1 capsule (120 mg total) by mouth daily.  90 capsule  3  . esomeprazole (NEXIUM) 40 MG capsule Take 1 capsule (40 mg total) by  mouth 2 (two) times daily.  60 capsule  11  . fexofenadine (ALLEGRA) 180 MG tablet Take 1 tablet (180 mg total) by mouth daily.  90 tablet  3  . fluticasone (FLONASE) 50 MCG/ACT nasal spray Place 2 sprays into the nose daily.  16 g  2  . lactase (LACTAID) 3000 UNITS tablet Take 2 tablets (6,000 Units total) by mouth 3 (three) times daily with meals.  60 tablet  6  . losartan-hydrochlorothiazide (HYZAAR) 100-25 MG per tablet Take 1 tablet by mouth daily.  90 tablet  3  . metoprolol (LOPRESSOR) 50 MG tablet Take 1 tablet (50 mg total) by mouth 2 (two) times daily.  180 tablet  3  . nystatin (MYCOSTATIN) powder Use as directed twice per day to affected area  15 g  0  . traMADol (ULTRAM) 50 MG tablet 1-2 tabs by mouth every 6 hours as needed for pain  100 tablet  1   No current facility-administered medications on file prior to visit.   Review of Systems  Constitutional: Negative for unexpected weight change, or unusual diaphoresis  HENT: Negative for tinnitus.   Eyes: Negative for photophobia and visual disturbance.  Respiratory: Negative for choking and stridor.   Gastrointestinal: Negative for vomiting and blood in stool.  Genitourinary: Negative for hematuria and decreased urine volume.  Musculoskeletal: Negative for acute joint swelling Skin: Negative for color  change and wound.  Neurological: Negative for tremors and numbness other than noted  Psychiatric/Behavioral: Negative for decreased concentration or  hyperactivity.       Objective:   Physical Exam BP 124/80  Pulse 61  Temp(Src) 97.1 F (36.2 C) (Oral)  Ht 5\' 3"  (1.6 m)  Wt 206 lb 8 oz (93.668 kg)  BMI 36.59 kg/m2  SpO2 95% VS noted, mild ill Constitutional: Pt appears well-developed and well-nourished.  HENT: Head: NCAT.  Right Ear: External ear normal.  Left Ear: External ear normal.  Bilat tm's with mild erythema.  Max sinus areas non tender.  Pharynx with mild erythema, no exudate Eyes: Conjunctivae and EOM are  normal. Pupils are equal, round, and reactive to light.  Neck: Normal range of motion. Neck supple.  Cardiovascular: Normal rate and regular rhythm.   Pulmonary/Chest: Effort normal and breath sounds normal.  Neurological: Pt is alert. Not confused  Skin: Skin is warm. Has nontender erythema and dry scaliness to left hand, few cracked skin area to tips of fingers Psychiatric: Pt behavior is normal. Thought content normal.     Assessment & Plan:

## 2012-11-26 NOTE — Assessment & Plan Note (Addendum)
For triam cr prn for prob dyshidrotic left hand

## 2012-12-14 ENCOUNTER — Telehealth: Payer: Self-pay | Admitting: Internal Medicine

## 2012-12-14 NOTE — Telephone Encounter (Signed)
Patient Information:  Caller Name: Joanette  Phone: [phone]  Patient: [name], [name]  Gender: Female  DOB: 05-Mar-1946  Age: 67 Years  PCP: [name] (Adults only)  Office Follow Up:  Does the office need to follow up with this patient?: No  Instructions For The Office: N/A  RN Note:  Left ear feels full and clogged. Ear throbs - rates it as a 10/10.  Is making head hurt and rates it as mild..  Nasal drainage is clear.  Cough productive-clear.  Intake is normal for patient.  Is urinating regularly.  Care advice given.  Offered appointment today at 16:15 with Dr. Jenny Reichmann, but declined because of work.  Scheduled with him on 12/15/12 at 08:15. Caller demonstrated her understanding.  Symptoms  Reason For Call & Symptoms: Unresolved issues with left ear.  Reviewed Health History In EMR: Yes  Reviewed Medications In EMR: Yes  Reviewed Allergies In EMR: Yes  Reviewed Surgeries / Procedures: Yes  Date of Onset of Symptoms: 12/07/2012  Guideline(s) Used:  Ear - Congestion  Disposition Per Guideline:   See Today in Office  Reason For Disposition Reached:   Earache lasts > 1 hour  Advice Given:  Treatment - Chewing and Swallowing:   Try chewing gum.  Call Back If:   You become worse.  RN Overrode Recommendation:  Make Appointment  Wanted appointment 02/27 versus today due to work concerns.  Appointment Scheduled:  12/15/2012 08:15:00 Appointment Scheduled Provider:  [name] (Adults only)

## 2012-12-14 NOTE — Telephone Encounter (Signed)
Pt can see Rollene Fare today if she prefers

## 2012-12-14 NOTE — Telephone Encounter (Signed)
Appt on Feb 27.

## 2012-12-15 ENCOUNTER — Encounter: Payer: Self-pay | Admitting: Internal Medicine

## 2012-12-15 ENCOUNTER — Ambulatory Visit (INDEPENDENT_AMBULATORY_CARE_PROVIDER_SITE_OTHER): Payer: Medicare PPO | Admitting: Internal Medicine

## 2012-12-15 VITALS — BP 110/72 | HR 60 | Temp 98.0°F | Ht 63.0 in | Wt 209.5 lb

## 2012-12-15 DIAGNOSIS — I10 Essential (primary) hypertension: Secondary | ICD-10-CM

## 2012-12-15 MED ORDER — LEVOFLOXACIN 250 MG PO TABS: 250.0000 mg | ORAL_TABLET | Freq: Every day | ORAL | Status: DC

## 2012-12-15 MED ORDER — METHYLPREDNISOLONE ACETATE 80 MG/ML IJ SUSP
120.0000 mg | Freq: Once | INTRAMUSCULAR | Status: AC
  Administered 2012-12-15: 120 mg via INTRAMUSCULAR

## 2012-12-15 NOTE — Patient Instructions (Addendum)
You had the steroid shot today Please take all new medication as prescribed - the antibiotic You can also take Delsym OTC for cough, and/or Mucinex (or it's generic off brand) for congestion, and tylenol as needed for pain. Please continue all other medications as before, and refills have been done if requested.

## 2012-12-15 NOTE — Progress Notes (Signed)
Subjective:    Patient ID: [name], female    DOB: 1946-04-28, 67 y.o.   MRN: [id number]  HPI  Here with pain to left ear for 4 days, with ? Low grade temp, but no hearing loss, vertigo, HA, ST or cough.  Does have left ear fullness and popping/crackling worse with chewing and talking.  Does have several wks ongoing nasal allergy symptoms with clearish congestion, itch and sneezing, without fever, pain, ST, cough, swelling or wheezing.  Pt denies polydipsia, polyuria, Past Medical History  Diagnosis Date  . HTN (hypertension)   . Hyperlipidemia   . Obesity   . Osteoarthritis   . DJD (degenerative joint disease), lumbar   . Chronic headaches   . GERD (gastroesophageal reflux disease)   . Allergic rhinitis, cause unspecified    Past Surgical History  Procedure Laterality Date  . Tubal ligation      reports that she has never smoked. She has never used smokeless tobacco. She reports that she does not drink alcohol or use illicit drugs. family history includes Diabetes in her brother; Heart disease in her mother; and Hypertension in her mother.  There is no history of Colon cancer, and Esophageal cancer, and Rectal cancer, and Stomach cancer, . Allergies  Allergen Reactions  . Sulfonamide Derivatives    Current Outpatient Prescriptions on File Prior to Visit  Medication Sig Dispense Refill  . atorvastatin (LIPITOR) 20 MG tablet Take 1 tablet (20 mg total) by mouth daily.  90 tablet  3  . diclofenac (VOLTAREN) 75 MG EC tablet Take 1 tablet (75 mg total) by mouth 2 (two) times daily.  30 tablet  0  . diltiazem (TIAZAC) 120 MG 24 hr capsule Take 1 capsule (120 mg total) by mouth daily.  90 capsule  3  . esomeprazole (NEXIUM) 40 MG capsule Take 1 capsule (40 mg total) by mouth 2 (two) times daily.  60 capsule  11  . fexofenadine (ALLEGRA) 180 MG tablet Take 1 tablet (180 mg total) by mouth daily.  90 tablet  3  . fluticasone (FLONASE) 50 MCG/ACT nasal spray Place 2 sprays into the  nose daily.  16 g  2  . lactase (LACTAID) 3000 UNITS tablet Take 2 tablets (6,000 Units total) by mouth 3 (three) times daily with meals.  60 tablet  6  . losartan-hydrochlorothiazide (HYZAAR) 100-25 MG per tablet Take 1 tablet by mouth daily.  90 tablet  3  . metoprolol (LOPRESSOR) 50 MG tablet Take 1 tablet (50 mg total) by mouth 2 (two) times daily.  180 tablet  3  . nystatin (MYCOSTATIN) powder Use as directed twice per day to affected area  15 g  0  . traMADol (ULTRAM) 50 MG tablet 1-2 tabs by mouth every 6 hours as needed for pain  100 tablet  1  . triamcinolone cream (KENALOG) 0.1 % Apply topically 2 (two) times daily.  30 g  0   No current facility-administered medications on file prior to visit.    Review of Systems  Constitutional: Negative for unexpected weight change, or unusual diaphoresis  HENT: Negative for tinnitus.   Eyes: Negative for photophobia and visual disturbance.  Respiratory: Negative for choking and stridor.   Gastrointestinal: Negative for vomiting and blood in stool.  Genitourinary: Negative for hematuria and decreased urine volume.  Musculoskeletal: Negative for acute joint swelling Skin: Negative for color change and wound.  Neurological: Negative for tremors and numbness other than noted  Psychiatric/Behavioral: Negative for decreased concentration or  hyperactivity.       Objective:   Physical Exam BP 110/72  Pulse 60  Temp(Src) 98 F (36.7 C) (Oral)  Ht 5\' 3"  (1.6 m)  Wt 209 lb 8 oz (95.029 kg)  BMI 37.12 kg/m2  SpO2 99% VS noted, mild ill Constitutional: Pt appears well-developed and well-nourished.  HENT: Head: NCAT.  Right Ear: External ear normal.  Left Ear: External ear normal, but left canal with 1+ red/tender,swelling Bilat tm's with mild erythema.  Max sinus areas non tender.  Pharynx with mild erythema, no exudate Eyes: Conjunctivae and EOM are normal. Pupils are equal, round, and reactive to light.  Neck: Normal range of motion. Neck  supple.  Cardiovascular: Normal rate and regular rhythm.   Pulmonary/Chest: Effort normal and breath sounds normal.  Neurological: Pt is alert. Not confused  Skin: Skin is warm. No erythema. No rash Psychiatric: Pt behavior is normal. Thought content normal.     Assessment & Plan:

## 2012-12-18 NOTE — Assessment & Plan Note (Signed)
For mucinex otc prn,  to f/u any worsening symptoms or concerns  

## 2012-12-18 NOTE — Assessment & Plan Note (Signed)
With flare recent, for depomedrol IM 80, cont flonase asd,  to f/u any worsening symptoms or concerns

## 2012-12-18 NOTE — Assessment & Plan Note (Signed)
To change antibx to levaquin asd,  to f/u any worsening symptoms or concerns

## 2012-12-18 NOTE — Assessment & Plan Note (Signed)
stable overall by history and exam, recent data reviewed with pt, and pt to continue medical treatment as before,  to f/u any worsening symptoms or concerns BP Readings from Last 3 Encounters:  12/15/12 110/72  11/25/12 124/80  09/30/12 124/74

## 2013-01-27 ENCOUNTER — Encounter: Payer: Self-pay | Admitting: Internal Medicine

## 2013-01-30 ENCOUNTER — Encounter: Payer: Self-pay | Admitting: Internal Medicine

## 2013-01-30 ENCOUNTER — Ambulatory Visit (INDEPENDENT_AMBULATORY_CARE_PROVIDER_SITE_OTHER): Payer: Medicare PPO | Admitting: Internal Medicine

## 2013-01-30 VITALS — BP 112/70 | HR 60 | Temp 97.4°F | Ht 63.0 in | Wt 206.2 lb

## 2013-01-30 DIAGNOSIS — Z23 Encounter for immunization: Secondary | ICD-10-CM

## 2013-01-30 DIAGNOSIS — J309 Allergic rhinitis, unspecified: Secondary | ICD-10-CM

## 2013-01-30 DIAGNOSIS — Z0000 Encounter for general adult medical examination without abnormal findings: Secondary | ICD-10-CM

## 2013-01-30 MED ORDER — METHYLPREDNISOLONE ACETATE 80 MG/ML IJ SUSP
80.0000 mg | Freq: Once | INTRAMUSCULAR | Status: AC
  Administered 2013-01-30: 80 mg via INTRAMUSCULAR

## 2013-01-30 MED ORDER — ATORVASTATIN CALCIUM 20 MG PO TABS: 20.0000 mg | ORAL_TABLET | Freq: Every day | ORAL | Status: DC

## 2013-01-30 MED ORDER — METOPROLOL TARTRATE 50 MG PO TABS: 50.0000 mg | ORAL_TABLET | Freq: Two times a day (BID) | ORAL | Status: DC

## 2013-01-30 MED ORDER — FLUTICASONE PROPIONATE 50 MCG/ACT NA SUSP: 2.0000 | Freq: Every day | NASAL | Status: DC

## 2013-01-30 MED ORDER — LOSARTAN POTASSIUM-HCTZ 100-25 MG PO TABS: 1.0000 | ORAL_TABLET | Freq: Every day | ORAL | Status: DC

## 2013-01-30 MED ORDER — DILTIAZEM HCL ER BEADS 120 MG PO CP24: 120.0000 mg | ORAL_CAPSULE | Freq: Every day | ORAL | Status: DC

## 2013-01-30 NOTE — Patient Instructions (Addendum)
You had the pneumonia and steroid shots today Please take all new medication as prescribed Please continue all other medications as before, and refills have been done if requested. Please continue your efforts at being more active, low cholesterol diet, and weight control. You are otherwise up to date with prevention measures today. Thank you for enrolling in [facility]. Please follow the instructions below to securely access your online medical record. MyChart allows you to send messages to your doctor, view your test results, renew your prescriptions, schedule appointments, and more. Please return in 6 months, or sooner if needed

## 2013-01-30 NOTE — Assessment & Plan Note (Signed)
Mild to mod, for depomedrol IM, and flonase asd,  to f/u any worsening symptoms or concerns

## 2013-01-30 NOTE — Progress Notes (Signed)
Subjective:    Patient ID: [name], female    DOB: [birth year], 67 y.o.   MRN: [id number]  HPI  Here for wellness and f/u;  Overall doing ok;  Pt denies CP, worsening SOB, DOE, wheezing, orthopnea, PND, worsening LE edema, palpitations, dizziness or syncope.  Pt denies neurological change such as new headache, facial or extremity weakness.  Pt denies polydipsia, polyuria, or low sugar symptoms. Pt states overall good compliance with treatment and medications, good tolerability, and has been trying to follow lower cholesterol diet.  Pt denies worsening depressive symptoms, suicidal ideation or panic. No fever, night sweats, wt loss, loss of appetite, or other constitutional symptoms.  Pt states good ability with ADL's, has low fall risk, home safety reviewed and adequate, no other significant changes in hearing or vision, and only occasionally active with exercise.  Does have several wks ongoing nasal allergy symptoms with clearish congestion, itch and sneezing, without fever, pain, ST, cough, swelling or wheezing. Past Medical History  Diagnosis Date  . HTN (hypertension)   . Hyperlipidemia   . Obesity   . Osteoarthritis   . DJD (degenerative joint disease), lumbar   . Chronic headaches   . GERD (gastroesophageal reflux disease)   . Allergic rhinitis, cause unspecified    Past Surgical History  Procedure Laterality Date  . Tubal ligation      reports that she has never smoked. She has never used smokeless tobacco. She reports that she does not drink alcohol or use illicit drugs. family history includes Diabetes in her brother; Heart disease in her mother; and Hypertension in her mother.  There is no history of Colon cancer, and Esophageal cancer, and Rectal cancer, and Stomach cancer, . Allergies  Allergen Reactions  . Sulfonamide Derivatives    Current Outpatient Prescriptions on File Prior to Visit  Medication Sig Dispense Refill  . diclofenac (VOLTAREN) 75 MG EC tablet Take 1  tablet (75 mg total) by mouth 2 (two) times daily.  30 tablet  0  . esomeprazole (NEXIUM) 40 MG capsule Take 1 capsule (40 mg total) by mouth 2 (two) times daily.  60 capsule  11  . fexofenadine (ALLEGRA) 180 MG tablet Take 1 tablet (180 mg total) by mouth daily.  90 tablet  3  . lactase (LACTAID) 3000 UNITS tablet Take 2 tablets (6,000 Units total) by mouth 3 (three) times daily with meals.  60 tablet  6  . traMADol (ULTRAM) 50 MG tablet 1-2 tabs by mouth every 6 hours as needed for pain  100 tablet  1  . triamcinolone cream (KENALOG) 0.1 % Apply topically 2 (two) times daily.  30 g  0  . nystatin (MYCOSTATIN) powder Use as directed twice per day to affected area  15 g  0   No current facility-administered medications on file prior to visit.   Review of Systems Constitutional: Negative for diaphoresis, activity change, appetite change or unexpected weight change.  HENT: Negative for hearing loss, ear pain, facial swelling, mouth sores and neck stiffness.   Eyes: Negative for pain, redness and visual disturbance.  Respiratory: Negative for shortness of breath and wheezing.   Cardiovascular: Negative for chest pain and palpitations.  Gastrointestinal: Negative for diarrhea, blood in stool, abdominal distention or other pain Genitourinary: Negative for hematuria, flank pain or change in urine volume.  Musculoskeletal: Negative for myalgias and joint swelling.  Skin: Negative for color change and wound.  Neurological: Negative for syncope and numbness. other than noted Hematological: Negative for adenopathy.  Psychiatric/Behavioral: Negative for hallucinations, self-injury, decreased concentration and agitation.      Objective:   Physical Exam BP 112/70  Pulse 60  Temp(Src) 97.4 F (36.3 C) (Oral)  Ht 5\' 3"  (1.6 m)  Wt 206 lb 4 oz (93.554 kg)  BMI 36.54 kg/m2  SpO2 97% VS noted, not ill appaering Constitutional: Pt is oriented to person, place, and time. Appears well-developed and  well-nourished.  Head: Normocephalic and atraumatic.  Right Ear: External ear normal.  Left Ear: External ear normal.  Nose: Nose normal.  Mouth/Throat: Oropharynx is clear and moist.  Eyes: Conjunctivae and EOM are normal. Pupils are equal, round, and reactive to light.  Neck: Normal range of motion. Neck supple. No JVD present. No tracheal deviation present.  Cardiovascular: Normal rate, regular rhythm, normal heart sounds and intact distal pulses.   Pulmonary/Chest: Effort normal and breath sounds normal.  Abdominal: Soft. Bowel sounds are normal. There is no tenderness. No HSM  Musculoskeletal: Normal range of motion. Exhibits no edema.  Lymphadenopathy:  Has no cervical adenopathy.  Neurological: Pt is alert and oriented to person, place, and time. Pt has normal reflexes. No cranial nerve deficit.  Skin: Skin is warm and dry. No rash noted.  Psychiatric:  Has  normal mood and affect. Behavior is normal.     Assessment & Plan:

## 2013-01-30 NOTE — Assessment & Plan Note (Signed)

## 2013-03-14 ENCOUNTER — Telehealth: Payer: Self-pay

## 2013-03-14 NOTE — Telephone Encounter (Signed)
The patient is having some congestions and left ear fullness/pain.  Started back a few week ago once finished antibiotic.  Call back number is [phone], [facility] [address]

## 2013-03-14 NOTE — Telephone Encounter (Signed)
If no fever, ok to try mucinex otc and/or an antihistamine such as allegra or zyrtec prn

## 2013-03-14 NOTE — Telephone Encounter (Signed)
Patient inforemd

## 2013-04-06 ENCOUNTER — Telehealth: Payer: Self-pay

## 2013-04-06 MED ORDER — METOPROLOL TARTRATE 50 MG PO TABS: 50.0000 mg | ORAL_TABLET | Freq: Two times a day (BID) | ORAL | Status: DC

## 2013-04-06 MED ORDER — LOSARTAN POTASSIUM-HCTZ 100-25 MG PO TABS: 1.0000 | ORAL_TABLET | Freq: Every day | ORAL | Status: DC

## 2013-04-06 NOTE — Telephone Encounter (Signed)
[facility]ill to [facility]

## 2013-04-10 ENCOUNTER — Telehealth: Payer: Self-pay | Admitting: Internal Medicine

## 2013-04-10 ENCOUNTER — Telehealth: Payer: Self-pay

## 2013-04-10 MED ORDER — LOSARTAN POTASSIUM-HCTZ 100-25 MG PO TABS: 1.0000 | ORAL_TABLET | Freq: Every day | ORAL | Status: DC

## 2013-04-10 MED ORDER — METOPROLOL TARTRATE 50 MG PO TABS: 50.0000 mg | ORAL_TABLET | Freq: Two times a day (BID) | ORAL | Status: DC

## 2013-04-10 MED ORDER — DILTIAZEM HCL ER BEADS 120 MG PO CP24: 120.0000 mg | ORAL_CAPSULE | Freq: Every day | ORAL | Status: DC

## 2013-04-10 MED ORDER — ATORVASTATIN CALCIUM 20 MG PO TABS: 20.0000 mg | ORAL_TABLET | Freq: Every day | ORAL | Status: DC

## 2013-04-10 NOTE — Telephone Encounter (Signed)
Medications were resent today.

## 2013-04-10 NOTE — Telephone Encounter (Signed)
Pt called back and stated that [facility] call her and stated that they need another response from Dr. Jenny Reichmann concern about those 4 med refill. Please advise.

## 2013-04-10 NOTE — Telephone Encounter (Signed)
Refills to [facility]

## 2013-04-24 ENCOUNTER — Ambulatory Visit (INDEPENDENT_AMBULATORY_CARE_PROVIDER_SITE_OTHER): Payer: Commercial Managed Care - HMO | Admitting: Internal Medicine

## 2013-04-24 ENCOUNTER — Encounter: Payer: Self-pay | Admitting: Internal Medicine

## 2013-04-24 VITALS — BP 110/80 | HR 69 | Ht 63.0 in | Wt 210.8 lb

## 2013-04-24 DIAGNOSIS — H6980 Other specified disorders of Eustachian tube, unspecified ear: Secondary | ICD-10-CM

## 2013-04-24 DIAGNOSIS — M1712 Unilateral primary osteoarthritis, left knee: Secondary | ICD-10-CM | POA: Insufficient documentation

## 2013-04-24 DIAGNOSIS — H6990 Unspecified Eustachian tube disorder, unspecified ear: Secondary | ICD-10-CM | POA: Diagnosis not present

## 2013-04-24 DIAGNOSIS — I10 Essential (primary) hypertension: Secondary | ICD-10-CM

## 2013-04-24 DIAGNOSIS — J309 Allergic rhinitis, unspecified: Secondary | ICD-10-CM

## 2013-04-24 DIAGNOSIS — H6992 Unspecified Eustachian tube disorder, left ear: Secondary | ICD-10-CM | POA: Insufficient documentation

## 2013-04-24 DIAGNOSIS — M1710 Unilateral primary osteoarthritis, unspecified knee: Secondary | ICD-10-CM

## 2013-04-24 DIAGNOSIS — H6982 Other specified disorders of Eustachian tube, left ear: Secondary | ICD-10-CM

## 2013-04-24 DIAGNOSIS — R232 Flushing: Secondary | ICD-10-CM

## 2013-04-24 DIAGNOSIS — N951 Menopausal and female climacteric states: Secondary | ICD-10-CM | POA: Diagnosis not present

## 2013-04-24 MED ORDER — DICLOFENAC SODIUM 75 MG PO TBEC: 75.0000 mg | DELAYED_RELEASE_TABLET | Freq: Two times a day (BID) | ORAL | Status: DC

## 2013-04-24 MED ORDER — TRAMADOL HCL 50 MG PO TABS: ORAL_TABLET | ORAL | Status: DC

## 2013-04-24 MED ORDER — CETIRIZINE HCL 10 MG PO TABS: 10.0000 mg | ORAL_TABLET | Freq: Every day | ORAL | Status: DC

## 2013-04-24 MED ORDER — SERTRALINE HCL 50 MG PO TABS: 50.0000 mg | ORAL_TABLET | Freq: Every day | ORAL | Status: DC

## 2013-04-24 MED ORDER — METHYLPREDNISOLONE ACETATE 80 MG/ML IJ SUSP
80.0000 mg | Freq: Once | INTRAMUSCULAR | Status: AC
  Administered 2013-04-24: 80 mg via INTRAMUSCULAR

## 2013-04-24 NOTE — Assessment & Plan Note (Signed)
Mild to mod, for depomedrol IM, change allegra to zyrtec prn, re-start flonase,  to f/u any worsening symptoms or concerns

## 2013-04-24 NOTE — Progress Notes (Signed)
Subjective:    Patient ID: [name], female    DOB: 05-16-1946, 67 y.o.   MRN: [id number]  HPI  Here to f/u, Does have several wks ongoing nasal allergy symptoms with clearish congestion, itch and sneezing, without fever, pain, ST, cough, swelling or wheezing. Allegra not working, and has been out of flonase.  Also with left ear fullness, ringing and minor vertigo with nausea.  No hearing loss, HA, fever or falls.  Also with ocas hot flashes worse recently with increased stressors. Also c/o ongoing ? Worsening bilat knee arthritic pain, some right swelling, worse to walk, better to sit, daily, out of nsaid/tramadol, helped in past, has not seen ortho or cortisone or recent films.  No click, catch, giveawys or falls Past Medical History  Diagnosis Date  . HTN (hypertension)   . Hyperlipidemia   . Obesity   . Osteoarthritis   . DJD (degenerative joint disease), lumbar   . Chronic headaches   . GERD (gastroesophageal reflux disease)   . Allergic rhinitis, cause unspecified    Past Surgical History  Procedure Laterality Date  . Tubal ligation      reports that she has never smoked. She has never used smokeless tobacco. She reports that she does not drink alcohol or use illicit drugs. family history includes Diabetes in her brother; Heart disease in her mother; and Hypertension in her mother.  There is no history of Colon cancer, and Esophageal cancer, and Rectal cancer, and Stomach cancer, . Allergies  Allergen Reactions  . Sulfonamide Derivatives    Current Outpatient Prescriptions on File Prior to Visit  Medication Sig Dispense Refill  . atorvastatin (LIPITOR) 20 MG tablet Take 1 tablet (20 mg total) by mouth daily.  90 tablet  3  . diltiazem (TIAZAC) 120 MG 24 hr capsule Take 1 capsule (120 mg total) by mouth daily.  90 capsule  3  . esomeprazole (NEXIUM) 40 MG capsule Take 1 capsule (40 mg total) by mouth 2 (two) times daily.  60 capsule  11  . fluticasone (FLONASE) 50 MCG/ACT  nasal spray Place 2 sprays into the nose daily.  16 g  11  . losartan-hydrochlorothiazide (HYZAAR) 100-25 MG per tablet Take 1 tablet by mouth daily.  90 tablet  3  . metoprolol (LOPRESSOR) 50 MG tablet Take 1 tablet (50 mg total) by mouth 2 (two) times daily.  180 tablet  3  . triamcinolone cream (KENALOG) 0.1 % Apply topically 2 (two) times daily.  30 g  0  . fexofenadine (ALLEGRA) 180 MG tablet Take 1 tablet (180 mg total) by mouth daily.  90 tablet  3  . lactase (LACTAID) 3000 UNITS tablet Take 2 tablets (6,000 Units total) by mouth 3 (three) times daily with meals.  60 tablet  6  . nystatin (MYCOSTATIN) powder Use as directed twice per day to affected area  15 g  0   No current facility-administered medications on file prior to visit.   Review of Systems  Constitutional: Negative for unexpected weight change, or unusual diaphoresis  HENT: Negative for tinnitus.   Eyes: Negative for photophobia and visual disturbance.  Respiratory: Negative for choking and stridor.   Gastrointestinal: Negative for vomiting and blood in stool.  Genitourinary: Negative for hematuria and decreased urine volume.  Musculoskeletal: Negative for acute joint swelling Skin: Negative for color change and wound.  Neurological: Negative for tremors and numbness other than noted  Psychiatric/Behavioral: Negative for decreased concentration or  hyperactivity.  Objective:   Physical Exam BP 110/80  Pulse 69  Ht 5\' 3"  (1.6 m)  Wt 210 lb 12 oz (95.596 kg)  BMI 37.34 kg/m2  SpO2 97% VS noted,  Constitutional: Pt appears well-developed and well-nourished.  HENT: Head: NCAT.  Right Ear: External ear normal.  Left Ear: External ear normal.  Bilat tm's with mild erythema.  Max sinus areas non tender.  Pharynx with mild erythema, no exudate Eyes: Conjunctivae and EOM are normal. Pupils are equal, round, and reactive to light.  Neck: Normal range of motion. Neck supple.  Cardiovascular: Normal rate and  regular rhythm.   Pulmonary/Chest: Effort normal and breath sounds normal.  Neurological: Pt is alert. Not confused  Skin: Skin is warm. No erythema.  Bilat knee with crepitus, small effusion, FROM, medial joint line tender Psychiatric: Pt behavior is normal. Thought content normal. mild nervous, ? Mild dysphoric    Assessment & Plan:

## 2013-04-24 NOTE — Assessment & Plan Note (Addendum)
Bilat, Ok for re-start meds for pain, likely does not need films for now, to ortho if pain not well controlled

## 2013-04-24 NOTE — Assessment & Plan Note (Signed)
Suspect more related to stressors than hormonal, mild, for zoloft 50 qd

## 2013-04-24 NOTE — Addendum Note (Signed)
Addended by: Sharon Seller B on: 04/24/2013 09:52 AM   Modules accepted: Orders

## 2013-04-24 NOTE — Patient Instructions (Signed)
You had the steroid shot today OK to stop the allegra Please take all new medication as prescribed  - the generic for Zyrtec and Zoloft Please continue all other medications as before, and refills have been done if requested - the flonase, diclofenac (anti-inflammatory) and tramadol (for pain) You can also take Mucinex (or it's generic off brand) for congestion and left ear problem, and tylenol as needed for pain. Please call if you need referral to orthopedic

## 2013-04-24 NOTE — Assessment & Plan Note (Signed)
Also for mucinex otc prn,  to f/u any worsening symptoms or concerns  

## 2013-04-24 NOTE — Assessment & Plan Note (Signed)
stable overall by history and exam, recent data reviewed with pt, and pt to continue medical treatment as before,  to f/u any worsening symptoms or concerns BP Readings from Last 3 Encounters:  04/24/13 110/80  01/30/13 112/70  12/15/12 110/72

## 2013-04-25 ENCOUNTER — Telehealth: Payer: Self-pay | Admitting: Internal Medicine

## 2013-04-25 NOTE — Telephone Encounter (Signed)
Patient informed. 

## 2013-04-25 NOTE — Telephone Encounter (Signed)
This is most likey due to the zoloft only  Please take 1/2 tabs for 1 wk, then whole tab after that

## 2013-04-25 NOTE — Telephone Encounter (Signed)
Pt has diarrhea.  She thinks it is from the Diclofenac.  She has taken it before with out the diarrhea.  She also took Sertraline.  She is also jittery after taking them.  What should she do?

## 2013-06-05 ENCOUNTER — Telehealth: Payer: Self-pay | Admitting: Internal Medicine

## 2013-06-06 MED ORDER — ESOMEPRAZOLE MAGNESIUM 40 MG PO CPDR: 40.0000 mg | DELAYED_RELEASE_CAPSULE | Freq: Two times a day (BID) | ORAL | Status: DC

## 2013-06-06 NOTE — Telephone Encounter (Signed)
Spoke to pt. Told her I will send her refill in to [facility] on [address]. Pt thanked me and verbalized understanding

## 2013-06-07 ENCOUNTER — Telehealth: Payer: Self-pay | Admitting: Gastroenterology

## 2013-06-07 ENCOUNTER — Other Ambulatory Visit: Payer: Self-pay | Admitting: Gastroenterology

## 2013-06-07 ENCOUNTER — Telehealth: Payer: Self-pay | Admitting: Internal Medicine

## 2013-06-07 MED ORDER — OMEPRAZOLE 40 MG PO CPDR: 40.0000 mg | DELAYED_RELEASE_CAPSULE | Freq: Two times a day (BID) | ORAL | Status: DC

## 2013-06-07 NOTE — Telephone Encounter (Signed)
Pt needs a prior auth for omeprazole. Faxed in form to [place]

## 2013-06-07 NOTE — Telephone Encounter (Signed)
Spoke to pt. Cost of medication is too much asked if there was anything else she can take. Per Dr. Hilarie Fredrickson I have sent in Omeprazole 40 mg BID.

## 2013-06-12 ENCOUNTER — Other Ambulatory Visit: Payer: Self-pay | Admitting: Gastroenterology

## 2013-06-12 MED ORDER — OMEPRAZOLE 40 MG PO CPDR: 40.0000 mg | DELAYED_RELEASE_CAPSULE | Freq: Every day | ORAL | Status: DC

## 2013-06-23 ENCOUNTER — Telehealth: Payer: Self-pay | Admitting: Internal Medicine

## 2013-06-23 NOTE — Telephone Encounter (Signed)
The patient has been notified of this information and all questions answered. She will call for further problems or questions.

## 2013-06-23 NOTE — Telephone Encounter (Signed)
Pt reports a rash on her face that itches and abdominal pain since she started the omeprazole; started a week ago. Questioned the pt about new soaps, detergents, foods and nothing is new. She put lotion on the rash to soothe the itching, but she is c/o the pain. Instructed pt to stop the omeprazole. Other orders? Thanks.

## 2013-06-23 NOTE — Telephone Encounter (Signed)
Stop omeprazole and use Zantac 150 mg twice a day and also Benadryl 25 mg twice a day for now.

## 2013-06-30 ENCOUNTER — Telehealth: Payer: Self-pay | Admitting: Internal Medicine

## 2013-06-30 NOTE — Telephone Encounter (Signed)
Pt had called on 06/23/13 reporting a rash with omeprazole and she was instructed to try Zantac 150mg  BID. She states she had diarrhea on and off ith Nexium, but now the diarrhea has been constant since yesterday and she thinks it's the Zantac. Pt was instructed to stop the Zantac and monitor her stools over there weekend to see if the diarrhea clears up. She will call next week if diarrhea persists.

## 2013-07-03 ENCOUNTER — Telehealth: Payer: Self-pay | Admitting: Internal Medicine

## 2013-07-03 NOTE — Telephone Encounter (Signed)
Pt states she is better but a little constipated. She did have a BM this morning. Pt wanted to know what she could take for acid reflux. Discussed with pt that she could try Omeprazole of Nexium OTC. Pt verbalized understanding.

## 2013-07-06 ENCOUNTER — Telehealth: Payer: Self-pay | Admitting: Internal Medicine

## 2013-07-06 MED ORDER — DILTIAZEM HCL ER BEADS 120 MG PO CP24: 120.0000 mg | ORAL_CAPSULE | Freq: Every day | ORAL | Status: DC

## 2013-07-06 MED ORDER — ATORVASTATIN CALCIUM 20 MG PO TABS: 20.0000 mg | ORAL_TABLET | Freq: Every day | ORAL | Status: DC

## 2013-07-06 NOTE — Telephone Encounter (Signed)
Pt request refill for Lipitor and Diltiazem to be send into [facility] on [address] for 30 day supply. Please advise.

## 2013-08-01 ENCOUNTER — Ambulatory Visit (INDEPENDENT_AMBULATORY_CARE_PROVIDER_SITE_OTHER): Payer: Medicare PPO | Admitting: Internal Medicine

## 2013-08-01 ENCOUNTER — Other Ambulatory Visit (INDEPENDENT_AMBULATORY_CARE_PROVIDER_SITE_OTHER): Payer: Medicare PPO

## 2013-08-01 ENCOUNTER — Encounter: Payer: Self-pay | Admitting: Internal Medicine

## 2013-08-01 VITALS — BP 112/78 | HR 63 | Temp 98.3°F | Ht 63.0 in | Wt 206.4 lb

## 2013-08-01 DIAGNOSIS — J309 Allergic rhinitis, unspecified: Secondary | ICD-10-CM

## 2013-08-01 DIAGNOSIS — I10 Essential (primary) hypertension: Secondary | ICD-10-CM

## 2013-08-01 DIAGNOSIS — E785 Hyperlipidemia, unspecified: Secondary | ICD-10-CM

## 2013-08-01 DIAGNOSIS — E7800 Pure hypercholesterolemia, unspecified: Secondary | ICD-10-CM

## 2013-08-01 DIAGNOSIS — Z23 Encounter for immunization: Secondary | ICD-10-CM

## 2013-08-01 LAB — HEPATIC FUNCTION PANEL
Albumin: 3.8 g/dL (ref 3.5–5.2)
Alkaline Phosphatase: 82 U/L (ref 39–117)
Total Protein: 7.4 g/dL (ref 6.0–8.3)

## 2013-08-01 LAB — CBC WITH DIFFERENTIAL/PLATELET
Basophils Absolute: 0 10*3/uL (ref 0.0–0.1)
Basophils Relative: 0.6 % (ref 0.0–3.0)
Eosinophils Absolute: 0.6 10*3/uL (ref 0.0–0.7)
Eosinophils Relative: 8.6 % — ABNORMAL HIGH (ref 0.0–5.0)
HCT: 35.5 % — ABNORMAL LOW (ref 36.0–46.0)
Hemoglobin: 11.9 g/dL — ABNORMAL LOW (ref 12.0–15.0)
Lymphocytes Relative: 27.7 % (ref 12.0–46.0)
Lymphs Abs: 2.1 10*3/uL (ref 0.7–4.0)
MCHC: 33.6 g/dL (ref 30.0–36.0)
Monocytes Relative: 8.6 % (ref 3.0–12.0)
Neutro Abs: 4.1 10*3/uL (ref 1.4–7.7)
RDW: 15.2 % — ABNORMAL HIGH (ref 11.5–14.6)
WBC: 7.5 10*3/uL (ref 4.5–10.5)

## 2013-08-01 LAB — URINALYSIS, ROUTINE W REFLEX MICROSCOPIC
Bilirubin Urine: NEGATIVE
Leukocytes, UA: NEGATIVE
RBC / HPF: NONE SEEN (ref 0–?)
Specific Gravity, Urine: 1.01 (ref 1.000–1.030)
Urine Glucose: NEGATIVE
Urobilinogen, UA: 0.2 (ref 0.0–1.0)
WBC, UA: NONE SEEN (ref 0–?)
pH: 7 (ref 5.0–8.0)

## 2013-08-01 LAB — TSH: TSH: 0.46 u[IU]/mL (ref 0.35–5.50)

## 2013-08-01 LAB — LIPID PANEL
Cholesterol: 183 mg/dL (ref 0–200)
HDL: 59.7 mg/dL (ref >39.00)
VLDL: 27.2 mg/dL (ref 0.0–40.0)

## 2013-08-01 LAB — BASIC METABOLIC PANEL
CO2: 28 mEq/L (ref 19–32)
Chloride: 101 mEq/L (ref 96–112)

## 2013-08-01 NOTE — Progress Notes (Signed)
Subjective:    Patient ID: [name], female    DOB: August 16, 1946, 67 y.o.   MRN: [id number]  HPI  Here to f/u; overall doing ok,  Pt denies chest pain, increased sob or doe, wheezing, orthopnea, PND, increased LE swelling, palpitations, dizziness or syncope.  Pt denies polydipsia, polyuria, or low sugar symptoms such as weakness or confusion improved with po intake.  Pt denies new neurological symptoms such as new headache, or facial or extremity weakness or numbness.   Pt states overall good compliance with meds, has been trying to follow lower cholesterol diet, with wt overall stable,  but little exercise however. Does have several wks ongoing nasal allergy symptoms with clearish congestion, itch and sneezing, without fever, pain, ST, cough, swelling or wheezing, all despite current meds. Meant to start exercise at the gym but has not yet started, hard to lose wt Past Medical History  Diagnosis Date  . HTN (hypertension)   . Hyperlipidemia   . Obesity   . Osteoarthritis   . DJD (degenerative joint disease), lumbar   . Chronic headaches   . GERD (gastroesophageal reflux disease)   . Allergic rhinitis, cause unspecified    Past Surgical History  Procedure Laterality Date  . Tubal ligation      reports that she has never smoked. She has never used smokeless tobacco. She reports that she does not drink alcohol or use illicit drugs. family history includes Diabetes in her brother; Heart disease in her mother; Hypertension in her mother. There is no history of Colon cancer, Esophageal cancer, Rectal cancer, or Stomach cancer. Allergies  Allergen Reactions  . Sulfonamide Derivatives    Current Outpatient Prescriptions on File Prior to Visit  Medication Sig Dispense Refill  . atorvastatin (LIPITOR) 20 MG tablet Take 1 tablet (20 mg total) by mouth daily.  30 tablet  11  . cetirizine (ZYRTEC) 10 MG tablet Take 1 tablet (10 mg total) by mouth daily. As needed  90 tablet  3  . diclofenac  (VOLTAREN) 75 MG EC tablet Take 1 tablet (75 mg total) by mouth 2 (two) times daily.  60 tablet  5  . diltiazem (TIAZAC) 120 MG 24 hr capsule Take 1 capsule (120 mg total) by mouth daily.  30 capsule  11  . fluticasone (FLONASE) 50 MCG/ACT nasal spray Place 2 sprays into the nose daily.  16 g  11  . losartan-hydrochlorothiazide (HYZAAR) 100-25 MG per tablet Take 1 tablet by mouth daily.  90 tablet  3  . metoprolol (LOPRESSOR) 50 MG tablet Take 1 tablet (50 mg total) by mouth 2 (two) times daily.  180 tablet  3  . omeprazole (PRILOSEC) 40 MG capsule Take 1 capsule (40 mg total) by mouth daily.  90 capsule  3  . sertraline (ZOLOFT) 50 MG tablet Take 1 tablet (50 mg total) by mouth daily.  90 tablet  3  . traMADol (ULTRAM) 50 MG tablet 1-2 tabs by mouth every 6 hours as needed for pain  120 tablet  2  . triamcinolone cream (KENALOG) 0.1 % Apply topically 2 (two) times daily.  30 g  0  . fexofenadine (ALLEGRA) 180 MG tablet Take 1 tablet (180 mg total) by mouth daily.  90 tablet  3  . lactase (LACTAID) 3000 UNITS tablet Take 2 tablets (6,000 Units total) by mouth 3 (three) times daily with meals.  60 tablet  6  . nystatin (MYCOSTATIN) powder Use as directed twice per day to affected area  15 g  0   No current facility-administered medications on file prior to visit.    Review of Systems  Constitutional: Negative for unexpected weight change, or unusual diaphoresis  HENT: Negative for tinnitus.   Eyes: Negative for photophobia and visual disturbance.  Respiratory: Negative for choking and stridor.   Gastrointestinal: Negative for vomiting and blood in stool.  Genitourinary: Negative for hematuria and decreased urine volume.  Musculoskeletal: Negative for acute joint swelling Skin: Negative for color change and wound.  Neurological: Negative for tremors and numbness other than noted  Psychiatric/Behavioral: Negative for decreased concentration or  hyperactivity.       Objective:   Physical  Exam BP 112/78  Pulse 63  Temp(Src) 98.3 F (36.8 C) (Oral)  Ht 5\' 3"  (1.6 m)  Wt 206 lb 6 oz (93.611 kg)  BMI 36.57 kg/m2  SpO2 95% VS noted, not ill appearing Constitutional: Pt appears well-developed and well-nourished.  HENT: Head: NCAT.  Right Ear: External ear normal.  Left Ear: External ear normal.  Eyes: Conjunctivae and EOM are normal. Pupils are equal, round, and reactive to light.  Neck: Normal range of motion. Neck supple.  Cardiovascular: Normal rate and regular rhythm.   Pulmonary/Chest: Effort normal and breath sounds normal.  Abd:  Soft, NT, non-distended, + BS Neurological: Pt is alert. Not confused  Skin: Skin is warm. No erythema.  Psychiatric: Pt behavior is normal. Thought content normal.     Assessment & Plan:

## 2013-08-01 NOTE — Assessment & Plan Note (Signed)
stable overall by history and exam, recent data reviewed with pt, and pt to continue medical treatment as before,  to f/u any worsening symptoms or concerns BP Readings from Last 3 Encounters:  08/01/13 112/78  04/24/13 110/80  01/30/13 112/70

## 2013-08-01 NOTE — Assessment & Plan Note (Signed)
To cont current meds but uncontrolled, for allergy referral

## 2013-08-01 NOTE — Assessment & Plan Note (Signed)
stable overall by history and exam, recent data reviewed with pt, and pt to continue medical treatment as before,  to f/u any worsening symptoms or concerns Lab Results  Component Value Date   LDLCALC 91 11/28/2010

## 2013-08-01 NOTE — Patient Instructions (Addendum)
You had the flu shot today Please continue all other medications as before, and refills have been done if requested. Please have the pharmacy call with any other refills you may need. Please go to the LAB in the Basement (turn left off the elevator) for the tests to be done today You will be contacted by phone if any changes need to be made immediately.  Otherwise, you will receive a letter about your results with an explanation, but please check with MyChart first.  You will be contacted regarding the referral for: allergist  Please remember to sign up for My Chart if you have not done so, as this will be important to you in the future with finding out test results, communicating by private email, and scheduling acute appointments online when needed.  Please return in 1 year for your yearly visit, or sooner if needed

## 2013-09-18 ENCOUNTER — Other Ambulatory Visit: Payer: Self-pay | Admitting: Internal Medicine

## 2013-09-18 ENCOUNTER — Encounter: Payer: Self-pay | Admitting: Internal Medicine

## 2013-09-18 ENCOUNTER — Other Ambulatory Visit: Payer: Commercial Managed Care - HMO

## 2013-09-18 ENCOUNTER — Ambulatory Visit (INDEPENDENT_AMBULATORY_CARE_PROVIDER_SITE_OTHER): Payer: Medicare PPO | Admitting: Internal Medicine

## 2013-09-18 VITALS — BP 130/88 | HR 75 | Ht 62.0 in | Wt 208.0 lb

## 2013-09-18 DIAGNOSIS — T788XXS Other adverse effects, not elsewhere classified, sequela: Secondary | ICD-10-CM

## 2013-09-18 DIAGNOSIS — J309 Allergic rhinitis, unspecified: Secondary | ICD-10-CM

## 2013-09-18 DIAGNOSIS — Z1231 Encounter for screening mammogram for malignant neoplasm of breast: Secondary | ICD-10-CM

## 2013-09-18 MED ORDER — PHENYLEPHRINE HCL 1 % NA SOLN
3.0000 [drp] | Freq: Once | NASAL | Status: AC
  Administered 2013-09-18: 3 [drp] via NASAL

## 2013-09-18 NOTE — Patient Instructions (Signed)
Neb neo nasal  Order- lab- Allergy profile, food IgE allergy profile      Dx Allergic rhinitis, food allergy  Try otc decongestant Sudafed-PE, just one each morning for a few days

## 2013-09-18 NOTE — Progress Notes (Signed)
[birth year]- 4 yoF never smoker referred courtesy of Dr. Jenny Reichmann for allergy evaluation. She questions allergic sinusitis over the past 6 months for pressure in her ears and retro-orbital area. No postnasal drip. Some cough with tussive substernal soreness, scant clear sputum and occasional wheeze that never diagnosed with asthma. History of seasonal allergic rhinitis. 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 No skin rash. No problems with latex or aspirin. ENT surgery limited to tonsils. Environment: House with crawlspace, gas heat, outdoor dog. No smokers. Says "whole family" and had. Sorts of allergy problems, unspecified.  Prior to Admission medications   Medication Sig Start Date End Date Taking? Authorizing Provider  atorvastatin (LIPITOR) 20 MG tablet Take 1 tablet (20 mg total) by mouth daily. 07/06/13 07/06/14 Yes Biagio Borg, MD  cetirizine (ZYRTEC) 10 MG tablet Take 1 tablet (10 mg total) by mouth daily. As needed 04/24/13  Yes Biagio Borg, MD  dextromethorphan-guaiFENesin [facility] DM) 30-600 MG per 12 hr tablet Take 1 tablet by mouth 2 (two) times daily.   Yes Historical Provider, MD  diclofenac (VOLTAREN) 75 MG EC tablet Take 1 tablet (75 mg total) by mouth 2 (two) times daily. 04/24/13 04/24/14 Yes Biagio Borg, MD  diltiazem [facility]) 120 MG 24 hr capsule Take 1 capsule (120 mg total) by mouth daily. 07/06/13  Yes Biagio Borg, MD  esomeprazole (NEXIUM) 40 MG capsule Take 40 mg by mouth daily at 12 noon.   Yes Historical Provider, MD  fexofenadine (ALLEGRA) 180 MG tablet Take 1 tablet (180 mg total) by mouth daily. 03/18/12 09/18/13 Yes Biagio Borg, MD  fluticasone (FLONASE) 50 MCG/ACT nasal spray Place 2 sprays into the nose daily. 01/30/13  Yes Biagio Borg, MD  lactase (LACTAID) 3000 UNITS tablet Take 2 tablets (6,000 Units total) by mouth 3 (three) times daily with meals. 04/15/12 09/18/13 Yes Jerene Bears, MD  losartan-hydrochlorothiazide (HYZAAR) 100-25 MG per tablet  Take 1 tablet by mouth daily. 04/10/13  Yes Biagio Borg, MD  metoprolol (LOPRESSOR) 50 MG tablet Take 1 tablet (50 mg total) by mouth 2 (two) times daily. 04/10/13  Yes Biagio Borg, MD  nystatin (MYCOSTATIN) powder Use as directed twice per day to affected area 01/20/12 09/18/13 Yes Biagio Borg, MD  sertraline (ZOLOFT) 50 MG tablet Take 1 tablet (50 mg total) by mouth daily. 04/24/13  Yes Biagio Borg, MD  traMADol Veat[facility] Bourbon) 50 MG tablet 1-2 tabs by mouth every 6 hours as needed for pain 04/24/13  Yes Biagio Borg, MD  triamcinolone cream (KENALOG) 0.1 % Apply topically 2 (two) times daily. 11/25/12  Yes Biagio Borg, MD   Past Medical History  Diagnosis Date  . HTN (hypertension)   . Hyperlipidemia   . Obesity   . Osteoarthritis   . DJD (degenerative joint disease), lumbar   . Chronic headaches   . GERD (gastroesophageal reflux disease)   . Allergic rhinitis, cause unspecified    Past Surgical History  Procedure Laterality Date  . Tubal ligation     Family History  Problem Relation Age of Onset  . Heart disease Mother   . Hypertension Mother   . Diabetes Brother   . Colon cancer Neg Hx   . Esophageal cancer Neg Hx   . Rectal cancer Neg Hx   . Stomach cancer Neg Hx    History   Social History  . Marital Status: Widowed    Spouse Name: N/A  Number of Children: 3  . Years of Education: 12   Occupational History  . retired    Social History Main Topics  . Smoking status: Never Smoker   . Smokeless tobacco: Never Used  . Alcohol Use: No  . Drug Use: No  . Sexual Activity: Not on file   Other Topics Concern  . Not on file   Social History Narrative  . No narrative on file   ROS-see HPI Constitutional:   No-   weight loss, night sweats, fevers, chills, fatigue, lassitude. HEENT:   + headaches, no-difficulty swallowing, tooth/dental problems, sore throat,       No-  sneezing, +itching, +ear ache, nasal congestion, post nasal drip,  CV:  No-   chest pain, orthopnea, PND,  swelling in lower extremities, anasarca, dizziness, palpitations Resp: No-   shortness of breath with exertion or at rest.              + productive cough,  + non-productive cough,  No- coughing up of blood.              No-   change in color of mucus.  No- wheezing.   Skin: No-   rash or lesions. GI:  No-   heartburn, indigestion, abdominal pain, nausea, vomiting, diarrhea,                 change in bowel habits, loss of appetite GU: No-   dysuria, change in color of urine, no urgency or frequency.  No- flank pain. MS:  No-   joint pain or swelling.  No- decreased range of motion.  No- back pain. Neuro-     nothing unusual Psych:  No- change in mood or affect. No depression or anxiety.  No memory loss.  OBJ- Physical Exam General- Alert, Oriented, Affect-appropriate, Distress- none acute Skin- rash-none, lesions- none, excoriation- none Lymphadenopathy- none Head- atraumatic            Eyes- Gross vision intact, PERRLA, conjunctivae and secretions clear            Ears- Hearing, canals-normal            Nose- +pale mucosa, no-Septal dev, mucus, polyps, erosion, perforation             Throat- Mallampati II , mucosa clear , drainage- none, tonsils- atrophic Neck- flexible , trachea midline, no stridor , thyroid nl, carotid no bruit Chest - symmetrical excursion , unlabored           Heart/CV- RRR , no murmur , no gallop  , no rub, nl s1 s2                           - JVD- none , edema- none, stasis changes- none, varices- none           Lung- clear to P&A, wheeze- none, cough- none , dullness-none, rub- none           Chest wall-  Abd- tender-no, distended-no, bowel sounds-present, HSM- no Br/ Gen/ Rectal- Not done, not indicated Extrem- cyanosis- none, clubbing, none, atrophy- none, strength- nl Neuro- grossly intact to observation

## 2013-09-19 LAB — ALLERGY FULL PROFILE
Allergen,Goose feathers, e70: <0.1 kU/L
Alternaria Alternata: <0.1 kU/L
Bahia Grass: 0.98 kU/L — ABNORMAL HIGH
Bermuda Grass: 0.29 kU/L — ABNORMAL HIGH
Box Elder IgE: 1.71 kU/L — ABNORMAL HIGH
Candida Albicans: 0.41 kU/L — ABNORMAL HIGH
Curvularia lunata: 0.13 kU/L — ABNORMAL HIGH
Elm IgE: 1.52 kU/L — ABNORMAL HIGH
Fescue: 0.68 kU/L — ABNORMAL HIGH
G009 Red Top: 0.71 kU/L — ABNORMAL HIGH
Lamb's Quarters: <0.1 kU/L
Oak: 0.63 kU/L — ABNORMAL HIGH
Plantain: 0.17 kU/L — ABNORMAL HIGH
Stemphylium Botryosum: <0.1 kU/L
Timothy Grass: 0.46 kU/L — ABNORMAL HIGH

## 2013-09-19 LAB — ALLERGEN FOOD PROFILE SPECIFIC IGE
Apple: <0.1 kU/L
IgE (Immunoglobulin E), Serum: 48 IU/mL (ref 0.0–180.0)
Milk IgE: <0.1 kU/L
Orange: <0.1 kU/L
Peanut IgE: 0.7 kU/L — ABNORMAL HIGH
Soybean IgE: 0.3 kU/L — ABNORMAL HIGH
Tuna IgE: <0.1 kU/L

## 2013-09-22 ENCOUNTER — Telehealth: Payer: Self-pay | Admitting: Internal Medicine

## 2013-09-22 NOTE — Telephone Encounter (Signed)
Notes Recorded by Deneise Lever, MD on 09/19/2013 at 5:43 PM Allergy antibodies for dog, house dust, grass pollens, soy bean and peanut. This doesn't necessarily mean that all these cause problems, but pay attention and we will discuss at next ov.   I spoke with patient about results and she verbalized understanding and had no questions.

## 2013-10-05 DIAGNOSIS — Z91018 Allergy to other foods: Secondary | ICD-10-CM | POA: Insufficient documentation

## 2013-10-05 NOTE — Assessment & Plan Note (Signed)
Bananas, melon-throat itch She thinks, but is not positive, that these foods triggered symptoms. She understands to avoid foods which cause problems. I suggested a food diary. Plan-food allergy profile

## 2013-10-05 NOTE — Assessment & Plan Note (Addendum)
Chronic seasonal allergic rhinitis. Unclear whether increased symptoms over the last 6 months or present to a perennial allergic rhinitis or a different process. Plan-CTmax/fac, nebulizer nasal decongestant, Sudafed PE, allergy profile

## 2013-10-16 ENCOUNTER — Ambulatory Visit (HOSPITAL_COMMUNITY)
Admission: RE | Admit: 2013-10-16 | Discharge: 2013-10-16 | Disposition: A | Discharge status: Home / Self Care | Payer: Medicare PPO | Source: Ambulatory Visit | Attending: Internal Medicine | Admitting: Internal Medicine

## 2013-10-16 DIAGNOSIS — Z1231 Encounter for screening mammogram for malignant neoplasm of breast: Secondary | ICD-10-CM | POA: Insufficient documentation

## 2013-10-16 LAB — HM MAMMOGRAPHY

## 2013-10-31 ENCOUNTER — Encounter: Payer: Self-pay | Admitting: Internal Medicine

## 2013-10-31 ENCOUNTER — Ambulatory Visit (INDEPENDENT_AMBULATORY_CARE_PROVIDER_SITE_OTHER): Payer: Medicare PPO | Admitting: Internal Medicine

## 2013-10-31 VITALS — BP 126/72 | HR 62 | Ht 62.0 in | Wt 203.0 lb

## 2013-10-31 DIAGNOSIS — J329 Chronic sinusitis, unspecified: Secondary | ICD-10-CM

## 2013-10-31 DIAGNOSIS — J309 Allergic rhinitis, unspecified: Secondary | ICD-10-CM

## 2013-10-31 DIAGNOSIS — J310 Chronic rhinitis: Secondary | ICD-10-CM

## 2013-10-31 DIAGNOSIS — H6980 Other specified disorders of Eustachian tube, unspecified ear: Secondary | ICD-10-CM

## 2013-10-31 DIAGNOSIS — Z91018 Allergy to other foods: Secondary | ICD-10-CM

## 2013-10-31 MED ORDER — METHYLPREDNISOLONE ACETATE 80 MG/ML IJ SUSP
80.0000 mg | Freq: Once | INTRAMUSCULAR | Status: AC
  Administered 2013-10-31: 80 mg via INTRAMUSCULAR

## 2013-10-31 MED ORDER — PHENYLEPHRINE HCL 1 % NA SOLN
3.0000 [drp] | Freq: Once | NASAL | Status: AC
  Administered 2013-10-31: 3 [drp] via NASAL

## 2013-10-31 NOTE — Progress Notes (Signed)
[birth year]- 24 yoF never smoker referred courtesy of Dr. Jenny Reichmann for allergy evaluation. She questions allergic sinusitis over the past 6 months for pressure in her ears and retro-orbital area. No postnasal drip. Some cough with tussive substernal soreness, scant clear sputum and occasional wheeze that never diagnosed with asthma. History of seasonal allergic rhinitis. 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 No skin rash. No problems with latex or aspirin. ENT surgery limited to tonsils. Environment: House with crawlspace, gas heat, outdoor dog. No smokers. Says "whole family" and had. Sorts of allergy problems, unspecified.  10/31/13- 60 yoF never smoker referred courtesy of Dr. Jenny Reichmann for allergy evaluation. FOLLOWS FOR: has had stopped up head, headaches, and pressure in Left ear still. Cough as well-non productive. Occasional unpleasant smell. Left ear stopped up since Christmas, pops occasionally but does not hurt. Hearing okay. Sudafed causes palpitation, uses Flonase. Allergy Profile 09/18/2013-total IgE 48. Broadly positive for common allergens including aspergillus. Food profile positive for peanut, soybean. She reports banana and melons make her throat itch.  ROS-see HPI Constitutional:   No-   weight loss, night sweats, fevers, chills, fatigue, lassitude. HEENT:   + headaches, no-difficulty swallowing, tooth/dental problems, sore throat,       No-  sneezing, +itching, +ear ache, +nasal congestion, post nasal drip,  CV:  No-   chest pain, orthopnea, PND, swelling in lower extremities, anasarca, dizziness, palpitations Resp: No-   shortness of breath with exertion or at rest.              + productive cough,  + non-productive cough,  No- coughing up of blood.              No-   change in color of mucus.  No- wheezing.   Skin: No-   rash or lesions. GI:  No-   heartburn, indigestion, abdominal pain, nausea, vomiting,  GU: . MS:  No-   joint pain or swelling.    Neuro-     nothing unusual Psych:  No- change in mood or affect. No depression or anxiety.  No memory loss.  OBJ- Physical Exam General- Alert, Oriented, Affect-appropriate, Distress- none acute. Looks comfortable Skin- rash-none, lesions- none, excoriation- none Lymphadenopathy- none Head- atraumatic            Eyes- Gross vision intact, PERRLA, conjunctivae and secretions clear            Ears- Hearing, canals-normal            Nose- +pale mucosa, no-Septal dev, mucus, polyps, erosion, perforation             Throat- Mallampati II , mucosa clear , drainage- none, tonsils- atrophic Neck- flexible , trachea midline, no stridor , thyroid nl, carotid no bruit Chest - symmetrical excursion , unlabored           Heart/CV- RRR , no murmur , no gallop  , no rub, nl s1 s2                           - JVD- none , edema- none, stasis changes- none, varices- none           Lung- clear to P&A, wheeze- none, cough- none , dullness-none, rub- none           Chest wall-  Abd- Br/ Gen/ Rectal- Not done, not indicated Extrem- cyanosis- none, clubbing, none, atrophy- none, strength- nl Neuro- grossly  intact to observation

## 2013-10-31 NOTE — Patient Instructions (Signed)
Neb neo nasal  Depo 80  Order- CT max fac    Dx chronic sinusitis, rhinitis  Ok to continue Flonase nasal spray  Avoid banana and melon    Pay attention with peanut and soybeans to notice if they bother you.

## 2013-11-01 ENCOUNTER — Telehealth: Payer: Self-pay | Admitting: Internal Medicine

## 2013-11-01 DIAGNOSIS — J309 Allergic rhinitis, unspecified: Secondary | ICD-10-CM

## 2013-11-01 NOTE — Telephone Encounter (Signed)
Referral done

## 2013-11-01 NOTE — Telephone Encounter (Signed)
Pt needs a new  [employer] referral to see Dr. Annamaria Boots for sinusitis.

## 2013-11-03 ENCOUNTER — Ambulatory Visit (INDEPENDENT_AMBULATORY_CARE_PROVIDER_SITE_OTHER)
Admission: RE | Admit: 2013-11-03 | Discharge: 2013-11-03 | Disposition: A | Discharge status: Home / Self Care | Payer: Medicare PPO | Source: Ambulatory Visit | Attending: Internal Medicine | Admitting: Internal Medicine

## 2013-11-03 DIAGNOSIS — J329 Chronic sinusitis, unspecified: Secondary | ICD-10-CM

## 2013-11-03 DIAGNOSIS — J310 Chronic rhinitis: Secondary | ICD-10-CM

## 2013-11-25 NOTE — Assessment & Plan Note (Signed)
Avoid foods which cause problems.Antihistamine if needed

## 2013-11-25 NOTE — Assessment & Plan Note (Signed)
Allergy Profile 09/18/2013-total IgE 48. Broadly positive for common allergens including aspergillus. Food profile positive for peanut, soybean. She reports banana and melons make her throat itch. Plan-order CT of sinuses to exclude chronic sinusitis. Continue Flonase and antihistamines.

## 2013-11-25 NOTE — Assessment & Plan Note (Signed)
Left ear Plan-note response to nebulized nasal decongestant today, with Depo-Medrol

## 2013-12-13 ENCOUNTER — Ambulatory Visit: Payer: Medicare PPO | Admitting: Internal Medicine

## 2013-12-14 ENCOUNTER — Ambulatory Visit: Payer: Medicare PPO | Admitting: Internal Medicine

## 2014-01-12 ENCOUNTER — Ambulatory Visit (INDEPENDENT_AMBULATORY_CARE_PROVIDER_SITE_OTHER): Payer: Medicare PPO | Admitting: Internal Medicine

## 2014-01-12 ENCOUNTER — Encounter: Payer: Self-pay | Admitting: Internal Medicine

## 2014-01-12 VITALS — BP 124/66 | HR 58 | Ht 62.0 in | Wt 203.0 lb

## 2014-01-12 DIAGNOSIS — Z91018 Allergy to other foods: Secondary | ICD-10-CM

## 2014-01-12 DIAGNOSIS — J0100 Acute maxillary sinusitis, unspecified: Secondary | ICD-10-CM

## 2014-01-12 DIAGNOSIS — J309 Allergic rhinitis, unspecified: Secondary | ICD-10-CM

## 2014-01-12 MED ORDER — METHYLPREDNISOLONE ACETATE 80 MG/ML IJ SUSP
80.0000 mg | Freq: Once | INTRAMUSCULAR | Status: AC
Start: 2014-01-12 — End: 2014-01-12
  Administered 2014-01-12: 80 mg via INTRAMUSCULAR

## 2014-01-12 MED ORDER — AMOXICILLIN-POT CLAVULANATE 500-125 MG PO TABS: ORAL_TABLET | ORAL | Status: DC

## 2014-01-12 NOTE — Progress Notes (Signed)
[birth year]- 33 yoF never smoker referred courtesy of Dr. Jenny Reichmann for allergy evaluation. She questions allergic sinusitis over the past 6 months for pressure in her ears and retro-orbital area. No postnasal drip. Some cough with tussive substernal soreness, scant clear sputum and occasional wheeze that never diagnosed with asthma. History of seasonal allergic rhinitis. 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 No skin rash. No problems with latex or aspirin. ENT surgery limited to tonsils. Environment: House with crawlspace, gas heat, outdoor dog. No smokers. Says "whole family" has  allergy problems, unspecified.  10/31/13- 14 yoF never smoker referred courtesy of Dr. Jenny Reichmann for allergy evaluation. FOLLOWS FOR: has had stopped up head, headaches, and pressure in Left ear still. Cough as well-non productive. Occasional unpleasant smell. Left ear stopped up since Christmas, pops occasionally but does not hurt. Hearing okay. Sudafed causes palpitation, uses Flonase. Allergy Profile 09/18/2013-total IgE 48. Broadly positive for common allergens including aspergillus. Food profile positive for peanut, soybean. She reports banana and melons make her throat itch.  01/12/14- 67 yoF never smoker followed for allergic rhinitis, recurrent sinusitis, Food allergy. FOLLOWS FOR: sinus congestion, mucous tinged with blood.  especially bad the past couple of days.  Blames sinus infection-eyes are watering, and mild epistaxis, retro-orbital pressure. Using Flonase  CT maxfac 11/03/13 IMPRESSION:  Mild chronic left maxillary sinus disease, otherwise negative.  Electronically Signed  By: Rolla Flatten M.D.  On: 11/03/2013 13:41   ROS-see HPI Constitutional:   No-   weight loss, night sweats, fevers, chills, fatigue, lassitude. HEENT:   + headaches, no-difficulty swallowing, tooth/dental problems, sore throat,       No-  sneezing, +itching, +ear ache, +nasal congestion, post nasal drip,  CV:   No-   chest pain, orthopnea, PND, swelling in lower extremities, anasarca, dizziness, palpitations Resp: No-   shortness of breath with exertion or at rest.              No-productive cough, no-non-productive cough,  No- coughing up of blood.              No-   change in color of mucus.  No- wheezing.   Skin: No-   rash or lesions. GI:  No-   heartburn, indigestion, abdominal pain, nausea, vomiting,  GU: . MS:  No-   joint pain or swelling.   Neuro-     nothing unusual Psych:  No- change in mood or affect. No depression or anxiety.  No memory loss.  OBJ- Physical Exam General- Alert, Oriented, Affect-appropriate, Distress- none acute. Looks comfortable Skin- rash-none, lesions- none, excoriation- none Lymphadenopathy- none Head- atraumatic            Eyes- Gross vision intact, PERRLA, conjunctivae and secretions clear            Ears- Hearing, canals-normal            Nose- +bloody crusting right nostril, no-Septal dev, mucus, polyps, erosion, perforation             Throat- Mallampati II , mucosa clear , drainage- none, tonsils- atrophic Neck- flexible , trachea midline, no stridor , thyroid nl, carotid no bruit Chest - symmetrical excursion , unlabored           Heart/CV- RRR , no murmur , no gallop  , no rub, nl s1 s2                           -  JVD- none , edema- none, stasis changes- none, varices- none           Lung- clear to P&A, wheeze- none, cough- none , dullness-none, rub- none           Chest wall-  Abd- Br/ Gen/ Rectal- Not done, not indicated Extrem- cyanosis- none, clubbing, none, atrophy- none, strength- nl Neuro- grossly intact to observation

## 2014-01-12 NOTE — Patient Instructions (Addendum)
Neb neo nasal  Depo 80  Sample Dymista nasal spray   Use 1-2 puffs each nostril once daily at bedtime. When this is used up, go back to flonase nasal spray  Script for augmentin antibiotic

## 2014-02-09 ENCOUNTER — Encounter: Payer: Self-pay | Admitting: Internal Medicine

## 2014-02-09 DIAGNOSIS — J0100 Acute maxillary sinusitis, unspecified: Secondary | ICD-10-CM | POA: Insufficient documentation

## 2014-02-09 NOTE — Assessment & Plan Note (Signed)
Avoids suspect foods

## 2014-02-09 NOTE — Assessment & Plan Note (Signed)
Plan-amoxicillin, nasal nebulizer decongestant, Depo-Medrol

## 2014-02-09 NOTE — Assessment & Plan Note (Signed)
Plan-nasal nebulizer decongestant, Depo-Medrol, sample Dymista nasal spray

## 2014-03-15 ENCOUNTER — Ambulatory Visit (INDEPENDENT_AMBULATORY_CARE_PROVIDER_SITE_OTHER): Payer: Commercial Managed Care - HMO | Admitting: Internal Medicine

## 2014-03-15 ENCOUNTER — Encounter: Payer: Self-pay | Admitting: Internal Medicine

## 2014-03-15 VITALS — BP 114/80 | HR 79 | Temp 98.0°F | Ht 63.0 in | Wt 201.4 lb

## 2014-03-15 DIAGNOSIS — Z0000 Encounter for general adult medical examination without abnormal findings: Secondary | ICD-10-CM

## 2014-03-15 DIAGNOSIS — M25562 Pain in left knee: Secondary | ICD-10-CM

## 2014-03-15 DIAGNOSIS — Z23 Encounter for immunization: Secondary | ICD-10-CM

## 2014-03-15 DIAGNOSIS — Z136 Encounter for screening for cardiovascular disorders: Secondary | ICD-10-CM

## 2014-03-15 DIAGNOSIS — M25569 Pain in unspecified knee: Secondary | ICD-10-CM

## 2014-03-15 MED ORDER — SERTRALINE HCL 50 MG PO TABS: 50.0000 mg | ORAL_TABLET | Freq: Every day | ORAL | Status: DC

## 2014-03-15 MED ORDER — METOPROLOL TARTRATE 50 MG PO TABS: 50.0000 mg | ORAL_TABLET | Freq: Two times a day (BID) | ORAL | Status: DC

## 2014-03-15 MED ORDER — LOSARTAN POTASSIUM-HCTZ 100-25 MG PO TABS: 1.0000 | ORAL_TABLET | Freq: Every day | ORAL | Status: DC

## 2014-03-15 MED ORDER — TRAMADOL HCL 50 MG PO TABS: ORAL_TABLET | ORAL | Status: DC

## 2014-03-15 MED ORDER — DICLOFENAC SODIUM 75 MG PO TBEC: 75.0000 mg | DELAYED_RELEASE_TABLET | Freq: Two times a day (BID) | ORAL | Status: DC

## 2014-03-15 MED ORDER — ATORVASTATIN CALCIUM 20 MG PO TABS: 20.0000 mg | ORAL_TABLET | Freq: Every day | ORAL | Status: DC

## 2014-03-15 MED ORDER — ESOMEPRAZOLE MAGNESIUM 40 MG PO CPDR: 40.0000 mg | DELAYED_RELEASE_CAPSULE | Freq: Every day | ORAL | Status: DC

## 2014-03-15 NOTE — Progress Notes (Signed)
Subjective:    Patient ID: [name], female    DOB: 1946-04-04, 68 y.o.   MRN: [id number]  HPI  Here for wellness and f/u;  Overall doing ok;  Pt denies CP, worsening SOB, DOE, wheezing, orthopnea, PND, worsening LE edema, palpitations, dizziness or syncope.  Pt denies neurological change such as new headache, facial or extremity weakness.  Pt denies polydipsia, polyuria, or low sugar symptoms. Pt states overall good compliance with treatment and medications, good tolerability, and has been trying to follow lower cholesterol diet.  Pt denies worsening depressive symptoms, suicidal ideation or panic. No fever, night sweats, wt loss, loss of appetite, or other constitutional symptoms.  Pt states good ability with ADL's, has low fall risk, home safety reviewed and adequate, no other significant changes in hearing or vision, and only occasionally active with exercise, maily due to left knee pain and occas swelling that limits her, only able to go to gym once per wk, hard to lose wt.  Past Medical History  Diagnosis Date  . HTN (hypertension)   . Hyperlipidemia   . Obesity   . Osteoarthritis   . DJD (degenerative joint disease), lumbar   . Chronic headaches   . GERD (gastroesophageal reflux disease)   . Allergic rhinitis, cause unspecified    Past Surgical History  Procedure Laterality Date  . Tubal ligation      reports that she has never smoked. She has never used smokeless tobacco. She reports that she does not drink alcohol or use illicit drugs. family history includes Diabetes in her brother; Heart disease in her mother; Hypertension in her mother. There is no history of Colon cancer, Esophageal cancer, Rectal cancer, or Stomach cancer. Allergies  Allergen Reactions  . Sulfonamide Derivatives    Current Outpatient Prescriptions on File Prior to Visit  Medication Sig Dispense Refill  . cetirizine (ZYRTEC) 10 MG tablet Take 1 tablet (10 mg total) by mouth daily. As needed  90 tablet   3  . diltiazem (TIAZAC) 120 MG 24 hr capsule Take 1 capsule (120 mg total) by mouth daily.  30 capsule  11  . fluticasone (FLONASE) 50 MCG/ACT nasal spray Place 2 sprays into the nose daily.  16 g  11  . traMADol (ULTRAM) 50 MG tablet 1-2 tabs by mouth every 6 hours as needed for pain  120 tablet  2  . triamcinolone cream (KENALOG) 0.1 % Apply topically 2 (two) times daily.  30 g  0  . nystatin (MYCOSTATIN) powder Use as directed twice per day to affected area  15 g  0   No current facility-administered medications on file prior to visit.    Review of Systems Constitutional: Negative for increased diaphoresis, other activity, appetite or other siginficant weight change  HENT: Negative for worsening hearing loss, ear pain, facial swelling, mouth sores and neck stiffness.   Eyes: Negative for other worsening pain, redness or visual disturbance.  Respiratory: Negative for shortness of breath and wheezing.   Cardiovascular: Negative for chest pain and palpitations.  Gastrointestinal: Negative for diarrhea, blood in stool, abdominal distention or other pain Genitourinary: Negative for hematuria, flank pain or change in urine volume.  Musculoskeletal: Negative for myalgias or other joint complaints.  Skin: Negative for color change and wound.  Neurological: Negative for syncope and numbness. other than noted Hematological: Negative for adenopathy. or other swelling Psychiatric/Behavioral: Negative for hallucinations, self-injury, decreased concentration or other worsening agitation.      Objective:   Physical Exam BP  114/80  Pulse 79  Temp(Src) 98 F (36.7 C) (Oral)  Ht 5\' 3"  (1.6 m)  Wt 201 lb 6 oz (91.343 kg)  BMI 35.68 kg/m2  SpO2 97% VS noted,  Constitutional: Pt is oriented to person, place, and time. Appears well-developed and well-nourished.  Head: Normocephalic and atraumatic.  Right Ear: External ear normal.  Left Ear: External ear normal.  Nose: Nose normal.    Mouth/Throat: Oropharynx is clear and moist.  Eyes: Conjunctivae and EOM are normal. Pupils are equal, round, and reactive to light.  Neck: Normal range of motion. Neck supple. No JVD present. No tracheal deviation present.  Cardiovascular: Normal rate, regular rhythm, normal heart sounds and intact distal pulses.   Pulmonary/Chest: Effort normal and breath sounds without rales or wheezing  Abdominal: Soft. Bowel sounds are normal. NT. No HSM  Musculoskeletal: Normal range of motion. Exhibits no edema.  Lymphadenopathy:  Has no cervical adenopathy.  Neurological: Pt is alert and oriented to person, place, and time. Pt has normal reflexes. No cranial nerve deficit. Motor grossly intact Skin: Skin is warm and dry. No rash noted.  Psychiatric:  Has normal mood and affect. Behavior is normal.  Left knee stiff with no effusion, but mild crepitus, FROM    Assessment & Plan:

## 2014-03-15 NOTE — Addendum Note (Signed)
Addended by: Sharon Seller B on: 03/15/2014 10:49 AM   Modules accepted: Orders

## 2014-03-15 NOTE — Progress Notes (Signed)
Pre visit review using our clinic review tool, if applicable. No additional management support is needed unless otherwise documented below in the visit note. 

## 2014-03-15 NOTE — Assessment & Plan Note (Signed)

## 2014-03-15 NOTE — Patient Instructions (Addendum)
You had the new Prevnar pneumonia shot today  Your EKG was OK today, and your Lab work from last visit was [facility] as well  Please continue all other medications as before, and refills have been done if requested - the tramadol and others  Please have the pharmacy call with any other refills you may need.  Please continue your efforts at being more active, low cholesterol diet, and weight control.  You are otherwise up to date with prevention measures today.  Please keep your appointments with your specialists as you may have planned  You will be contacted regarding the referral for: Dr Tamala Julian - sports medicine, for your left knee  Please return in 6 months, or sooner if needed

## 2014-03-15 NOTE — Assessment & Plan Note (Signed)
prob DJD, refill tramadol, refer sport medicine per pt request

## 2014-03-20 ENCOUNTER — Encounter: Payer: Self-pay | Admitting: Cardiology

## 2014-04-01 ENCOUNTER — Other Ambulatory Visit: Payer: Self-pay | Admitting: Internal Medicine

## 2014-04-02 ENCOUNTER — Telehealth: Payer: Self-pay | Admitting: Internal Medicine

## 2014-04-02 MED ORDER — FLUTICASONE PROPIONATE 50 MCG/ACT NA SUSP: 1.0000 | Freq: Every day | NASAL | Status: DC

## 2014-04-02 MED ORDER — AZELASTINE-FLUTICASONE 137-50 MCG/ACT NA SUSP: 1.0000 | Freq: Every day | NASAL | Status: DC

## 2014-04-02 MED ORDER — AZELASTINE HCL 0.1 % NA SOLN: 1.0000 | Freq: Every day | NASAL | Status: DC

## 2014-04-02 NOTE — Telephone Encounter (Signed)
Called, spoke with pt.  Dymista rx was sent in earlier.  Pt states copay is $45.  She cannot afford this. Offered to either leave [place] at front to try or to change dymista to generic flonase and astlin rxs.  Pt would like to have generics sent in.  This has been done.  Pt aware and is to call back with any further questions or concerns. Nothing further needed at this time.

## 2014-04-02 NOTE — Telephone Encounter (Signed)
Dymista rx sent to pharmacy-- [facility] [address] Use 1-2 puffs at bedtime  Pt aware.  Nothing further needed.

## 2014-04-09 ENCOUNTER — Ambulatory Visit: Payer: Commercial Managed Care - HMO | Admitting: Family Medicine

## 2014-04-10 ENCOUNTER — Ambulatory Visit: Payer: Commercial Managed Care - HMO | Admitting: Family Medicine

## 2014-05-09 ENCOUNTER — Ambulatory Visit: Payer: Commercial Managed Care - HMO | Admitting: Family Medicine

## 2014-05-11 ENCOUNTER — Ambulatory Visit: Payer: Commercial Managed Care - HMO | Admitting: Internal Medicine

## 2014-05-21 ENCOUNTER — Ambulatory Visit (INDEPENDENT_AMBULATORY_CARE_PROVIDER_SITE_OTHER): Payer: Commercial Managed Care - HMO | Admitting: Family Medicine

## 2014-05-21 ENCOUNTER — Encounter: Payer: Self-pay | Admitting: Family Medicine

## 2014-05-21 ENCOUNTER — Telehealth: Payer: Self-pay | Admitting: Family Medicine

## 2014-05-21 ENCOUNTER — Other Ambulatory Visit (INDEPENDENT_AMBULATORY_CARE_PROVIDER_SITE_OTHER): Payer: Commercial Managed Care - HMO

## 2014-05-21 VITALS — BP 124/82 | HR 67 | Ht 62.0 in | Wt 197.0 lb

## 2014-05-21 DIAGNOSIS — M1710 Unilateral primary osteoarthritis, unspecified knee: Secondary | ICD-10-CM

## 2014-05-21 DIAGNOSIS — M25569 Pain in unspecified knee: Secondary | ICD-10-CM

## 2014-05-21 DIAGNOSIS — M25561 Pain in right knee: Secondary | ICD-10-CM

## 2014-05-21 DIAGNOSIS — M25562 Pain in left knee: Principal | ICD-10-CM

## 2014-05-21 MED ORDER — DICLOFENAC SODIUM 2 % TD SOLN: TRANSDERMAL | Status: DC

## 2014-05-21 NOTE — Telephone Encounter (Signed)
Patient states that she forgot to tell Dr. Tamala Julian that she is unable to take Tylenl because it makes her sick on her stomach. She wants to know if he can recommend something else for her to take OTC. Please advise.

## 2014-05-21 NOTE — Assessment & Plan Note (Signed)
Patient overall does have moderate to severe osteoarthritis bilaterally. Patient was given different choices at this time. Patient was given a brace was fitted by me on her left knee. We also discussed topical anti-inflammatories as patient is going to try. We will get baseline x-rays today as well. In addition this patient was offered an intra-articular injections which he declined today. Patient will try home exercise program as well as an icing protocol and come back in 3-4 weeks for further evaluation and treatment. Continuing to have pain I would consider the injections.

## 2014-05-21 NOTE — Progress Notes (Signed)
  [name], [name] Phone: [phone] Subjective:    I'm seeing this patient by the request  of:  Brandy Cower, MD   CC: Bilateral knee pain  [id number] Elleen [name] is a 68 y.o. female coming in with complaint of  bilateral knee pain. Patient states she's had pain for years. Patient states is getting worse and seems to be on the medial aspect of the knees bilaterally. States it is worse with walking or going up stairs. Describes it as a dull aching pain that can be quite severe. Patient rates the pain as 7/10. Patient has tried all modalities such as over-the-counter medications without any significant improvement. Patient tries to remain active and goes to a gym fairly regularly. Icing does not seem to help. Better with rest. Denies any nighttime awakening.     Past medical history, social, surgical and family history all reviewed in electronic medical record.   Review of Systems: No headache, visual changes, nausea, vomiting, diarrhea, constipation, dizziness, abdominal pain, skin rash, fevers, chills, night sweats, weight loss, swollen lymph nodes, body aches, joint swelling, muscle aches, chest pain, shortness of breath, mood changes.   Objective Blood pressure 124/82, pulse 67, height 5\' 2"  (1.575 m), weight 197 lb (89.359 kg), SpO2 97.00%.  General: No apparent distress alert and oriented x3 mood and affect normal, dressed appropriately. Overweight HEENT: Pupils equal, extraocular movements intact  Respiratory: Patient's speak in full sentences and does not appear short of breath  Cardiovascular: No lower extremity edema, non tender, no erythema  Skin: Warm dry intact with no signs of infection or rash on extremities or on axial skeleton.  Abdomen: Soft nontender  Neuro: Cranial nerves II through XII are intact, neurovascularly intact in all extremities with 2+ DTRs and 2+ pulses.  Lymph: No lymphadenopathy of  posterior or anterior cervical chain or axillae bilaterally.  Gait normal with good balance and coordination.  MSK:  Non tender with full range of motion and good stability and symmetric strength and tone of shoulders, elbows, wrist, hip,  and ankles bilaterally.  Knee: Bilateral Normal to inspection with no erythema or effusion or obvious bony abnormalities. Moderate tenderness to palpation over the medial joint lines bilaterally Her range of motion shows the patient lacks less 5 of extension bilaterally Ligaments with solid consistent endpoints including ACL, PCL, LCL, MCL. Negative Mcmurray's, Apley's, and Thessalonian tests. Patellar glide moderate crepitus. Patellar and quadriceps tendons unremarkable. Hamstring and quadriceps strength is normal.   MSK US performed of: Bilateral knee This study was ordered, performed, and interpreted by [name] D.O.  Knees: All structures visualized. Severe joint line narrowing of the medial joint line bilaterally. Patient does have some osteophytic formations of the lateral joint line. Degenerative changes of the meniscus noted bilaterally. Patellar Tendon unremarkable on long and transverse views without effusion. No abnormality of prepatellar bursa. LCL and MCL unremarkable on long and transverse views. No abnormality of origin of medial or lateral head of the gastrocnemius.  IMPRESSION:  Osteoarthritis of the knees.      Impression and Recommendations:     This case required medical decision making of moderate complexity.

## 2014-05-21 NOTE — Patient Instructions (Signed)
Goo dot meet you Ice 20 minutes 2 times daily. Usually after activity and before bed. Exercises 3 times a week.  Xrays downstairs today this will give [place] a baseline.  Take tylenol 650 mg three times a day is the best evidence based medicine we have for arthritis.  Glucosamine sulfate 750mg  twice a day is a supplement that has been shown to help moderate to severe arthritis. Vitamin D 2000 IU daily Fish oil 2 grams daily.  Tumeric 500mg  twice daily.  Capsaicin topically up to four times a day may also help with pain. Cortisone injections are an option if these interventions do not seem to make a difference or need more relief.  If cortisone injections do not help, there are different types of shots that may help but they take longer to take effect.  We can discuss this at follow up.  It's important that you continue to stay active. Controlling your weight is important.  Consider physical therapy to strengthen muscles around the joint that hurts to take pressure off of the joint itself. Shoe inserts with good arch support may be helpful.  [employer] orthotics at [employer] could help.  Water aerobics and cycling with low resistance are the best two types of exercise for arthritis. Come back and see me in 3 weeks.

## 2014-05-21 NOTE — Telephone Encounter (Signed)
Discussed with pt

## 2014-05-22 ENCOUNTER — Ambulatory Visit (INDEPENDENT_AMBULATORY_CARE_PROVIDER_SITE_OTHER)
Admission: RE | Admit: 2014-05-22 | Discharge: 2014-05-22 | Disposition: A | Discharge status: Home / Self Care | Payer: Commercial Managed Care - HMO | Source: Ambulatory Visit | Attending: Family Medicine | Admitting: Family Medicine

## 2014-05-22 DIAGNOSIS — M25562 Pain in left knee: Principal | ICD-10-CM

## 2014-05-22 DIAGNOSIS — M25569 Pain in unspecified knee: Secondary | ICD-10-CM

## 2014-05-22 DIAGNOSIS — M25561 Pain in right knee: Secondary | ICD-10-CM

## 2014-05-28 ENCOUNTER — Other Ambulatory Visit: Payer: Self-pay

## 2014-05-28 MED ORDER — DILTIAZEM HCL ER BEADS 120 MG PO CP24: 120.0000 mg | ORAL_CAPSULE | Freq: Every day | ORAL | Status: DC

## 2014-06-11 ENCOUNTER — Encounter: Payer: Self-pay | Admitting: Family Medicine

## 2014-06-11 ENCOUNTER — Ambulatory Visit (INDEPENDENT_AMBULATORY_CARE_PROVIDER_SITE_OTHER): Payer: Commercial Managed Care - HMO | Admitting: Family Medicine

## 2014-06-11 VITALS — BP 124/84 | HR 62 | Ht 62.0 in | Wt 197.0 lb

## 2014-06-11 DIAGNOSIS — M1710 Unilateral primary osteoarthritis, unspecified knee: Secondary | ICD-10-CM

## 2014-06-11 NOTE — Patient Instructions (Signed)
Good to see  [name] is your friend.  Pennsaid twice daily.  Tramadol when you need it and at night.  Continue the brace Physical therapy will be calling you Come back in 3 weeks if not a lot better we can consider Visco supplementation

## 2014-06-11 NOTE — Progress Notes (Signed)
[name] [address] [place], [place] [address] Phone: [phone] Subjective:      CC: Bilateral knee pain follow up  [id number] [name] is a 68 y.o. female coming in with complaint of  bilateral knee pain. 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 Patient was given topical anti-inflammatories which she states has been helpful. Patient has been doing the exercises intermittently and sometimes icing. Patient states that she is only made approximately a 5% improvement though. Still having significant soreness especially with going up or down stairs or with walking or even standing long amount of time. Pain can be as bad at night. Denies any new symptoms.     Past medical history, social, surgical and family history all reviewed in electronic medical record.   Review of Systems: No headache, visual changes, nausea, vomiting, diarrhea, constipation, dizziness, abdominal pain, skin rash, fevers, chills, night sweats, weight loss, swollen lymph nodes, body aches, joint swelling, muscle aches, chest pain, shortness of breath, mood changes.   Objective Blood pressure 124/84, pulse 62, height 5\' 2"  (1.575 m), weight 197 lb (89.359 kg), SpO2 99.00%.  General: No apparent distress alert and oriented x3 mood and affect normal, dressed appropriately. Overweight HEENT: Pupils equal, extraocular movements intact  Respiratory: Patient's speak in full sentences and does not appear short of breath  Cardiovascular: No lower extremity edema, non tender, no erythema  Skin: Warm dry intact with no signs of infection or rash on extremities or on axial skeleton.  Abdomen: Soft nontender  Neuro: Cranial nerves II through XII are intact, neurovascularly intact in all extremities with 2+ DTRs and 2+ pulses.  Lymph: No lymphadenopathy of posterior or anterior cervical chain or  axillae bilaterally.  Gait normal with good balance and coordination.  MSK:  Non tender with full range of motion and good stability and symmetric strength and tone of shoulders, elbows, wrist, hip,  and ankles bilaterally.  Knee: Bilateral Normal to inspection with no erythema or effusion or obvious bony abnormalities. Moderate tenderness to palpation over the medial joint lines bilaterally Her range of motion shows the patient lacks less 5 of extension bilaterally Ligaments with solid consistent endpoints including ACL, PCL, LCL, MCL. Negative Mcmurray's, Apley's, and Thessalonian tests. Patellar glide moderate crepitus. Patellar and quadriceps tendons unremarkable. Hamstring and quadriceps strength is normal.  No change from previous exam  Procedure: Real-time Ultrasound Guided Injection of right knee Device: GE Logiq E  Ultrasound guided injection is preferred based studies that show increased duration, increased effect, greater accuracy, decreased procedural pain, increased response rate, and decreased cost with ultrasound guided versus blind injection.  Verbal informed consent obtained.  Time-out conducted.  Noted no overlying erythema, induration, or other signs of local infection.  Skin prepped in a sterile fashion.  Local anesthesia: Topical Ethyl chloride.  With sterile technique and under real time ultrasound guidance: With a 22-gauge 2 inch needle patient was injected with 4 cc of 0.5% Marcaine and 1 cc of Kenalog 40 mg/dL. This was from a superior lateral approach.  Completed without difficulty  Pain immediately resolved suggesting accurate placement of the medication.  Advised to call if fevers/chills, erythema, induration, drainage, or persistent bleeding.  Images permanently stored and available for review in the ultrasound unit.  Impression: Technically successful ultrasound guided injection.  Procedure: Real-time Ultrasound Guided Injection of left knee Device: GE  Logiq E  Ultrasound guided injection is preferred based studies that show increased duration, increased effect, greater accuracy, decreased procedural pain, increased response rate, and decreased cost with ultrasound guided versus blind injection.  Verbal informed consent obtained.  Time-out conducted.  Noted no overlying erythema, induration, or other signs of local infection.  Skin prepped in a sterile fashion.  Local anesthesia: Topical Ethyl chloride.  With sterile technique and under real time ultrasound guidance: With a 22-gauge 2 inch needle patient was injected with 4 cc of 0.5% Marcaine and 1 cc of Kenalog 40 mg/dL. This was from a superior lateral approach.  Completed without difficulty  Pain immediately resolved suggesting accurate placement of the medication.  Advised to call if fevers/chills, erythema, induration, drainage, or persistent bleeding.  Images permanently stored and available for review in the ultrasound unit.  Impression: Technically successful ultrasound guided injection.  Body mass index is 36.02 kg/(m^2).     Impression and Recommendations:     This case required medical decision making of moderate complexity.

## 2014-06-21 ENCOUNTER — Ambulatory Visit: Payer: Medicare PPO | Admitting: Physical Therapy

## 2014-07-03 ENCOUNTER — Ambulatory Visit: Payer: Medicare PPO | Admitting: Physical Therapy

## 2014-07-04 ENCOUNTER — Ambulatory Visit: Payer: Commercial Managed Care - HMO | Admitting: Internal Medicine

## 2014-07-04 ENCOUNTER — Encounter: Payer: Self-pay | Admitting: Family Medicine

## 2014-07-04 ENCOUNTER — Ambulatory Visit (INDEPENDENT_AMBULATORY_CARE_PROVIDER_SITE_OTHER): Payer: Commercial Managed Care - HMO | Admitting: Family Medicine

## 2014-07-04 VITALS — BP 132/84 | HR 66 | Ht 62.0 in | Wt 198.0 lb

## 2014-07-04 DIAGNOSIS — M1710 Unilateral primary osteoarthritis, unspecified knee: Secondary | ICD-10-CM

## 2014-07-04 DIAGNOSIS — S93409A Sprain of unspecified ligament of unspecified ankle, initial encounter: Secondary | ICD-10-CM | POA: Insufficient documentation

## 2014-07-04 NOTE — Assessment & Plan Note (Signed)
Patient does have a sprain of her right ankle. Patient was given a brace is fitted by me today. We discussed an icing regimen as well as a home exercise program with theraband. Patient will try these interventions and come back and see me again if pain does not completely resolve within the next 2-3 weeks.

## 2014-07-04 NOTE — Assessment & Plan Note (Signed)
Bilateral knee pain secondary to severe osteoarthritis. Patient did respond very well to corticosteroid injections. Patient encouraged to continue to do the exercises as well as going to formal physical therapy which patient has not done yet. Patient will continue with the topical anti-inflammatory. Patient will come back and see me again in 6 weeks for further evaluation. At that time and continued pain we'll consider doing a corticosteroid injection. If patient fails that she would be a candidate for viscous supplementation.  Spent greater than 25 minutes with patient face-to-face and had greater than 50% of counseling including as described above in assessment and plan.

## 2014-07-04 NOTE — Progress Notes (Signed)
[name] [address], [place] [address] Phone: [phone] Subjective:      CC: Bilateral knee pain follow up New onset right ankle pain  [id number] [name] is a 68 y.o. female coming in with complaint of  bilateral knee pain. 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 Patient was given topical anti-inflammatories which she states has been helpful. Patient has been doing the exercises intermittently and sometimes icing. Patient was seen last 3 weeks ago and was given bilateral steroid injections in the knees. Patient states that she is better by 75%. Patient states that she has been able to do more activity. Patient has been going the gym regularly. Patient states that the dull aching pain that was waken her up at night is significantly improved as well. Patient is fairly happy with the results.  Patient has had a new onset of right ankle pain. Patient may have twisted her ankle while she was at the gym. Did not have any swelling. More of a dull aching pain on the side of the ankle. Denies any numbness or tingling. States that it is just more uncomfortable. But the severity is 5/10.    Past medical history, social, surgical and family history all reviewed in electronic medical record.   Review of Systems: No headache, visual changes, nausea, vomiting, diarrhea, constipation, dizziness, abdominal pain, skin rash, fevers, chills, night sweats, weight loss, swollen lymph nodes, body aches, joint swelling, muscle aches, chest pain, shortness of breath, mood changes.   Objective Blood pressure 132/84, pulse 66, height 5\' 2"  (1.575 m), weight 198 lb (89.812 kg), SpO2 99.00%.  General: No apparent distress alert and oriented x3 mood and affect normal, dressed appropriately. Overweight HEENT: Pupils equal, extraocular movements intact  Respiratory: Patient's  speak in full sentences and does not appear short of breath  Cardiovascular: No lower extremity edema, non tender, no erythema  Skin: Warm dry intact with no signs of infection or rash on extremities or on axial skeleton.  Abdomen: Soft nontender  Neuro: Cranial nerves II through XII are intact, neurovascularly intact in all extremities with 2+ DTRs and 2+ pulses.  Lymph: No lymphadenopathy of posterior or anterior cervical chain or axillae bilaterally.  Gait normal with good balance and coordination.  MSK:  Non tender with full range of motion and good stability and symmetric strength and tone of shoulders, elbows, wrist, hips bilaterally.  Knee: Bilateral Normal to inspection with no erythema or effusion or obvious bony abnormalities. Continued mild to moderate tenderness over the medial joint line bilaterally Her range of motion shows the patient lacks less 5 of extension bilaterally Ligaments with solid consistent endpoints including ACL, PCL, LCL, MCL. Negative Mcmurray's, Apley's, and Thessalonian tests. Patellar glide moderate crepitus. Patellar and quadriceps tendons unremarkable. Hamstring and quadriceps strength is normal.  No change from previous exam  Ankle: Right No visible erythema or swelling. Range of motion is full in all directions. Strength is 5/5 in all directions. Patient does have some pain when stressing the ATFL as well as palpation over the ATFL. Talar dome nontender; No pain at base of 5th MT; No tenderness over cuboid; No tenderness over N spot or navicular prominence No tenderness on posterior aspects of lateral and medial malleolus No sign of peroneal tendon subluxations or tenderness to palpation Negative tarsal tunnel tinel's Able to walk 4 steps.  Impression and Recommendations:     This case required medical decision making of moderate complexity.

## 2014-07-04 NOTE — Patient Instructions (Signed)
Great to see you Ice to the ankle after walking or at the gym Wear the brace with walking for next 2 weeks.  Exercises 3 times a week for the knee and the ankle.  Come back in 6 weeks to make sure the knees are still doing well.

## 2014-07-10 ENCOUNTER — Ambulatory Visit: Payer: Commercial Managed Care - HMO | Attending: Internal Medicine | Admitting: Physical Therapy

## 2014-07-10 DIAGNOSIS — M25569 Pain in unspecified knee: Secondary | ICD-10-CM | POA: Insufficient documentation

## 2014-07-10 DIAGNOSIS — IMO0001 Reserved for inherently not codable concepts without codable children: Secondary | ICD-10-CM | POA: Diagnosis not present

## 2014-07-20 ENCOUNTER — Ambulatory Visit: Payer: Medicare PPO | Admitting: Internal Medicine

## 2014-07-23 ENCOUNTER — Encounter: Payer: Commercial Managed Care - HMO | Admitting: Rehabilitation

## 2014-07-24 ENCOUNTER — Encounter: Payer: Self-pay | Admitting: Family Medicine

## 2014-07-24 ENCOUNTER — Ambulatory Visit (INDEPENDENT_AMBULATORY_CARE_PROVIDER_SITE_OTHER): Payer: Commercial Managed Care - HMO | Admitting: Family Medicine

## 2014-07-24 VITALS — BP 106/80 | HR 67 | Ht 63.0 in | Wt 196.0 lb

## 2014-07-24 DIAGNOSIS — M1710 Unilateral primary osteoarthritis, unspecified knee: Secondary | ICD-10-CM

## 2014-07-24 NOTE — Assessment & Plan Note (Signed)
Patient was given another crepitus there injection today. This is only in the left knee. Patient seems to be nearly pain free in the right knee at the moment. I am hoping the patient does do well. It had been 8 weeks since her last injection. Patient encouraged to continue to wear the brace a regular basis. We talked about the possibility of formal physical therapy which patient declined. We discussed certain different exercises that would be more beneficial and which ones to avoid. Topical anti-inflammatory was given and patient was warned of potential side effects. Prescription sent in. We will try these conservative therapies and then she is going to come back in 3-4 weeks for further evaluation. He continues to have some discomfort patient would be a candidate for viscous supplementation. We also discussed secondary to the amount of severity of her arthritis she may need surgical intervention at some point.  Spent greater than 25 minutes with patient face-to-face and had greater than 50% of counseling including as described above in assessment and plan.

## 2014-07-24 NOTE — Progress Notes (Signed)
[name] [address] [place], [place] [address] Phone: [phone] Subjective:      CC: Bilateral knee pain follow up   [id number] [name] is a 68 y.o. female coming in with complaint of  bilateral knee pain. 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 Patient was given topical anti-inflammatories which she states has been helpful. Patient has been doing the exercises intermittently and sometimes icing. Patient was seen last 3 weeks ago and was given bilateral steroid injections in the knees. Patient was doing approximately 75% better. This was 3 weeks ago. Patient states patient unfortunately states that her left knee is starting to give her more discomfort. Patient has stopped wearing the brace can she was doing so well and has not started again. Patient does try do icing about 3-4 times a week. Denies any numbness though. States that it can radiate down her leg symptoms. Patient denies it giving her any mechanical symptoms such as giving out on her or locking on her. Sometimes the pain can keep her up at night but never wakes her up at night. Patient does take tramadol fairly regularly.  Patient also did have a sprain of her ankle. Patient was given a brace, popcorn temperatures, as well as home exercises. Patient states    Past medical history, social, surgical and family history all reviewed in electronic medical record.   Review of Systems: No headache, visual changes, nausea, vomiting, diarrhea, constipation, dizziness, abdominal pain, skin rash, fevers, chills, night sweats, weight loss, swollen lymph nodes, body aches, joint swelling, muscle aches, chest pain, shortness of breath, mood changes.   Objective Blood pressure 106/80, pulse 67, height 5\' 3"  (1.6 m), weight 196 lb (88.905 kg), SpO2 97.00%.  General: No apparent distress alert and oriented x3  mood and affect normal, dressed appropriately. Overweight HEENT: Pupils equal, extraocular movements intact  Respiratory: Patient's speak in full sentences and does not appear short of breath  Cardiovascular: No lower extremity edema, non tender, no erythema  Skin: Warm dry intact with no signs of infection or rash on extremities or on axial skeleton.  Abdomen: Soft nontender  Neuro: Cranial nerves II through XII are intact, neurovascularly intact in all extremities with 2+ DTRs and 2+ pulses.  Lymph: No lymphadenopathy of posterior or anterior cervical chain or axillae bilaterally.  Gait normal with good balance and coordination.  MSK:  Non tender with full range of motion and good stability and symmetric strength and tone of shoulders, elbows, wrist, hips bilaterally.  Knee: Bilateral Normal to inspection with no erythema or effusion or obvious bony abnormalities. Continued mild to moderate tenderness over the medial joint line bilaterally with left greater than right Her range of motion shows the patient lacks less 5 of extension bilaterally mild increase in decreased range of motion on the left compared to the right today. Ligaments with solid consistent endpoints including ACL, PCL, LCL, MCL. Negative Mcmurray's, Apley's, and Thessalonian tests. Patellar glide moderate crepitus. Patellar and quadriceps tendons unremarkable. Hamstring and quadriceps strength is normal.  Mild worsening the left knee on this exam.  Procedure: Real-time Ultrasound Guided Injection of left knee Device: GE Logiq E  Ultrasound guided injection is preferred based studies that show increased duration, increased effect, greater accuracy, decreased procedural pain, increased response rate, and decreased cost with ultrasound guided versus blind injection.  Verbal informed consent obtained.  Time-out conducted.  Noted no overlying erythema, induration, or other signs of local infection.  Skin prepped in a sterile  fashion.  Local anesthesia: Topical Ethyl chloride.  With sterile technique and under real time ultrasound guidance: With a 22-gauge 2 inch needle patient was injected with 4 cc of 0.5% Marcaine and 1 cc of Kenalog 40 mg/dL. This was from a superior lateral approach.  Completed without difficulty  Pain immediately resolved suggesting accurate placement of the medication.  Advised to call if fevers/chills, erythema, induration, drainage, or persistent bleeding.  Images permanently stored and available for review in the ultrasound unit.  Impression: Technically successful ultrasound guided injection.    Impression and Recommendations:     This case required medical decision making of moderate complexity.

## 2014-07-24 NOTE — Patient Instructions (Signed)
I would add 325 mg of tylenol 3 times a day especially with the tramadol.  Ice is your friend Would wear the brace with work and with walking.  Continue the exercises Come back again in 2-3 weeks and see how you are doing and we can start synvisc injections.

## 2014-07-25 ENCOUNTER — Encounter: Payer: Commercial Managed Care - HMO | Admitting: Physical Therapy

## 2014-07-30 ENCOUNTER — Ambulatory Visit: Payer: Medicare PPO | Attending: Internal Medicine | Admitting: Physical Therapy

## 2014-07-30 DIAGNOSIS — M25561 Pain in right knee: Secondary | ICD-10-CM | POA: Insufficient documentation

## 2014-07-30 DIAGNOSIS — M25562 Pain in left knee: Secondary | ICD-10-CM | POA: Insufficient documentation

## 2014-07-30 DIAGNOSIS — Z5189 Encounter for other specified aftercare: Secondary | ICD-10-CM | POA: Insufficient documentation

## 2014-08-01 ENCOUNTER — Encounter: Payer: Commercial Managed Care - HMO | Admitting: Rehabilitation

## 2014-08-14 ENCOUNTER — Ambulatory Visit: Payer: Commercial Managed Care - HMO | Admitting: Family Medicine

## 2014-08-15 ENCOUNTER — Ambulatory Visit: Payer: Commercial Managed Care - HMO | Admitting: Family Medicine

## 2014-08-22 ENCOUNTER — Encounter: Payer: Self-pay | Admitting: Family Medicine

## 2014-08-22 ENCOUNTER — Ambulatory Visit (INDEPENDENT_AMBULATORY_CARE_PROVIDER_SITE_OTHER): Payer: Medicare PPO | Admitting: Family Medicine

## 2014-08-22 VITALS — BP 116/74 | HR 64 | Ht 63.0 in | Wt 199.0 lb

## 2014-08-22 DIAGNOSIS — M1710 Unilateral primary osteoarthritis, unspecified knee: Secondary | ICD-10-CM

## 2014-08-22 NOTE — Assessment & Plan Note (Signed)
Patient hasn't injections in both knees. Patient does have severe osteophytic changes in likely will not be pain free at any point. Patient has a brace but does not wear a regular basis. Patient though is doing the topical anti-inflammatory and is able to do activities of daily living without any significant discomfort. I'm encouraged by her results so far. Discussed with patient we can do corticosteroid injections every 3 months on an as-needed basis. In addition to this we talked about the possibility of viscous supplementation. We talked about recurrent formal physical therapy but secondary to the financial constraints patient has she declined. Encourage patient to wear proper shoes at all times. Patient and will come back and see me again in 6 weeks for further evaluation and treatment.  Spent greater than 25 minutes with patient face-to-face and had greater than 50% of counseling including as described above in assessment and plan.

## 2014-08-22 NOTE — Progress Notes (Signed)
  [name] [address] [place], [place] [address] Phone: [phone] Subjective:      CC: Bilateral knee pain follow up   [id number] [name] is a 68 y.o. female coming in with complaint of  bilateral knee pain. 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  Patient at last follow-up did have an injection in her left knee. Patient has now had 2 injections walk, one in each knee and the last 2 months. Patient states that she is doing relatively well. Patient states that she is able to ambulate better. Still have difficulty with taking stairs. States that she is resting comfortably at night. Denies any radiation down her leg or any new symptoms. Patient continues on the topical anti-inflammatory. Patient is doing some of the over-the-counter medications as well. Overall she would states that she is approximately 60-70% better.  Patient also did have a sprain of her ankle. Patient was given a brace, popcorn temperatures, as well as home exercises. Patient states    Past medical history, social, surgical and family history all reviewed in electronic medical record.   Review of Systems: No headache, visual changes, nausea, vomiting, diarrhea, constipation, dizziness, abdominal pain, skin rash, fevers, chills, night sweats, weight loss, swollen lymph nodes, body aches, joint swelling, muscle aches, chest pain, shortness of breath, mood changes.   Objective Blood pressure 116/74, pulse 64, height 5\' 3"  (1.6 m), weight 199 lb (90.266 kg), SpO2 95 %.  General: No apparent distress alert and oriented x3 mood and affect normal, dressed appropriately. Overweight HEENT: Pupils equal, extraocular movements intact  Respiratory: Patient's speak in full sentences and does not appear short of breath  Cardiovascular: No lower extremity edema, non tender, no erythema  Skin: Warm  dry intact with no signs of infection or rash on extremities or on axial skeleton.  Abdomen: Soft nontender  Neuro: Cranial nerves II through XII are intact, neurovascularly intact in all extremities with 2+ DTRs and 2+ pulses.  Lymph: No lymphadenopathy of posterior or anterior cervical chain or axillae bilaterally.  Gait normal with good balance and coordination.  MSK:  Non tender with full range of motion and good stability and symmetric strength and tone of shoulders, elbows, wrist, hips bilaterally.  Knee: Bilateral Normal to inspection with no erythema or effusion or obvious bony abnormalities. Mild tenderness to palpation over the medial joint lines bilaterally. Her range of motion shows the patient lacks less 5 of extension bilaterally mild increase in decreased range of motion on the left compared to the right today. Ligaments with solid consistent endpoints including ACL, PCL, LCL, MCL. Negative Mcmurray's, Apley's, and Thessalonian tests. Patellar glide moderate crepitus. Patellar and quadriceps tendons unremarkable. Hamstring and quadriceps strength is normal.  Mild worsening the left knee on this exam.     Impression and Recommendations:     This case required medical decision making of moderate complexity.

## 2014-08-22 NOTE — Patient Instructions (Signed)
Good to see you You are doing great Ice is your friend.  Continue the exercises 3 times a week.  Continue the pennsaid  tylenol 3 times daily no matter what.  See me again in 6-8 weeks.

## 2014-08-24 ENCOUNTER — Other Ambulatory Visit: Payer: Self-pay | Admitting: Family Medicine

## 2014-08-24 NOTE — Telephone Encounter (Signed)
Refill done.  

## 2014-08-27 ENCOUNTER — Telehealth: Payer: Self-pay | Admitting: Internal Medicine

## 2014-08-27 MED ORDER — AZITHROMYCIN 250 MG PO TABS: 250.0000 mg | ORAL_TABLET | ORAL | Status: DC

## 2014-08-27 NOTE — Telephone Encounter (Signed)
Spoke with the pt and notified of recs per CDY  She verbalized understanding  Rx was sent to pharm  

## 2014-08-27 NOTE — Telephone Encounter (Signed)
Spoke with pt, she c/o headache, ears stopped up, nonprod cough, sinus congestion, and some postnasal drainage X2 weeks.  Pt denies fever, productive cough.  Has been using zyrtec and flonase, not helping.  Pt is wanting something called in to [facility] on [address] to help clear her up.  Last visit 07/20/14, next visit 09/17/14.    Dr. Annamaria Boots please advise.  Thank you.   Current Outpatient Prescriptions on File Prior to Visit  Medication Sig Dispense Refill  . atorvastatin (LIPITOR) 20 MG tablet Take 1 tablet (20 mg total) by mouth daily. 90 tablet 3  . azelastine (ASTELIN) 0.1 % nasal spray Place 1-2 sprays into both nostrils at bedtime. 30 mL 5  . cetirizine (ZYRTEC) 10 MG tablet Take 1 tablet (10 mg total) by mouth daily. As needed 90 tablet 3  . diclofenac (VOLTAREN) 75 MG EC tablet Take 1 tablet (75 mg total) by mouth 2 (two) times daily. 60 tablet 5  . diltiazem (TIAZAC) 120 MG 24 hr capsule Take 1 capsule (120 mg total) by mouth daily. 90 capsule 1  . esomeprazole (NEXIUM) 40 MG capsule Take 1 capsule (40 mg total) by mouth daily at 12 noon. 90 capsule 3  . fluticasone (FLONASE) 50 MCG/ACT nasal spray Place 2 sprays into the nose daily. 16 g 11  . fluticasone (FLONASE) 50 MCG/ACT nasal spray instill 2 sprays into each nostril once daily 16 g 11  . fluticasone (FLONASE) 50 MCG/ACT nasal spray Place 1-2 sprays into both nostrils at bedtime. 16 g 5  . losartan-hydrochlorothiazide (HYZAAR) 100-25 MG per tablet Take 1 tablet by mouth daily. 90 tablet 3  . metoprolol (LOPRESSOR) 50 MG tablet Take 1 tablet (50 mg total) by mouth 2 (two) times daily. 180 tablet 3  . nystatin (MYCOSTATIN) powder Use as directed twice per day to affected area 15 g 0  . PENNSAID 2 % SOLN APPLY  TO AFFECTED AREA TOPICALLY TWICE A DAY 112 g 3  . sertraline (ZOLOFT) 50 MG tablet Take 1 tablet (50 mg total) by mouth daily. 90 tablet 3  . traMADol (ULTRAM) 50 MG tablet 1-2 tabs by mouth every 6 hours as needed for pain  120 tablet 5  . triamcinolone cream (KENALOG) 0.1 % Apply topically 2 (two) times daily. 30 g 0   No current facility-administered medications on file prior to visit.   Allergies  Allergen Reactions  . Sulfonamide Derivatives

## 2014-08-27 NOTE — Telephone Encounter (Signed)
Suggest Z pak It may help to add Mucinex-D from pharmacy, once daily in the mornings to avoid raising BP or keeping her awake.

## 2014-09-10 ENCOUNTER — Telehealth: Payer: Self-pay | Admitting: Internal Medicine

## 2014-09-10 MED ORDER — MELOXICAM 15 MG PO TABS: 15.0000 mg | ORAL_TABLET | Freq: Every day | ORAL | Status: DC

## 2014-09-10 NOTE — Telephone Encounter (Signed)
Patient called stating her knee is stilll hurting and the tylenol is not helping. She would like a different medication. CB# [phone] Pt uses [facility] on [address].

## 2014-09-10 NOTE — Telephone Encounter (Signed)
Pt made aware

## 2014-09-17 ENCOUNTER — Ambulatory Visit (INDEPENDENT_AMBULATORY_CARE_PROVIDER_SITE_OTHER): Payer: Commercial Managed Care - HMO | Admitting: Internal Medicine

## 2014-09-17 ENCOUNTER — Encounter: Payer: Self-pay | Admitting: Internal Medicine

## 2014-09-17 VITALS — BP 124/82 | HR 62 | Ht 62.0 in | Wt 203.2 lb

## 2014-09-17 DIAGNOSIS — J309 Allergic rhinitis, unspecified: Secondary | ICD-10-CM

## 2014-09-17 MED ORDER — AMOXICILLIN-POT CLAVULANATE 875-125 MG PO TABS: 1.0000 | ORAL_TABLET | Freq: Two times a day (BID) | ORAL | Status: DC

## 2014-09-17 NOTE — Progress Notes (Signed)
12/1/ 27- 5 yoF never smoker referred courtesy of Dr. Jenny Reichmann for allergy evaluation. She questions allergic sinusitis over the past 6 months for pressure in her ears and retro-orbital area. No postnasal drip. Some cough with tussive substernal soreness, scant clear sputum and occasional wheeze that never diagnosed with asthma. History of seasonal allergic rhinitis. 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 No skin rash. No problems with latex or aspirin. ENT surgery limited to tonsils. Environment: House with crawlspace, gas heat, outdoor dog. No smokers. Says "whole family" has  allergy problems, unspecified.  10/31/13- 59 yoF never smoker referred courtesy of Dr. Jenny Reichmann for allergy evaluation. FOLLOWS FOR: has had stopped up head, headaches, and pressure in Left ear still. Cough as well-non productive. Occasional unpleasant smell. Left ear stopped up since Christmas, pops occasionally but does not hurt. Hearing okay. Sudafed causes palpitation, uses Flonase. Allergy Profile 09/18/2013-total IgE 48. Broadly positive for common allergens including aspergillus. Food profile positive for peanut, soybean. She reports banana and melons make her throat itch.  01/12/14- 67 yoF never smoker followed for allergic rhinitis, recurrent sinusitis, Food allergy. FOLLOWS FOR: sinus congestion, mucous tinged with blood.  especially bad the past couple of days.  Blames sinus infection-eyes are watering, and mild epistaxis, retro-orbital pressure. Using Flonase  CT maxfac 11/03/13 IMPRESSION:  Mild chronic left maxillary sinus disease, otherwise negative.  Electronically Signed  By: Rolla Flatten M.D.  On: 11/03/2013 13:41   09/17/14-  63 yoF never smoker followed for allergic rhinitis, recurrent sinusitis, Food allergy. FOLLOWS FOR: Still has slight stopped up feelings in left ear. No sense of smell as well.    ROS-see HPI Constitutional:   No-   weight loss, night sweats, fevers,  chills, fatigue, lassitude. HEENT:   + headaches, no-difficulty swallowing, tooth/dental problems, sore throat,       No-  sneezing, +itching, +ear ache, +nasal congestion, post nasal drip,  CV:  No-   chest pain, orthopnea, PND, swelling in lower extremities, anasarca, dizziness, palpitations Resp: No-   shortness of breath with exertion or at rest.              No-productive cough, no-non-productive cough,  No- coughing up of blood.              No-   change in color of mucus.  No- wheezing.   Skin: No-   rash or lesions. GI:  No-   heartburn, indigestion, abdominal pain, nausea, vomiting,  GU: . MS:  No-   joint pain or swelling.   Neuro-     nothing unusual Psych:  No- change in mood or affect. No depression or anxiety.  No memory loss.  OBJ- Physical Exam General- Alert, Oriented, Affect-appropriate, Distress- none acute. Looks comfortable Skin- rash-none, lesions- none, excoriation- none Lymphadenopathy- none Head- atraumatic            Eyes- Gross vision intact, PERRLA, conjunctivae and secretions clear            Ears- Hearing, canals-normal            Nose- +bloody crusting right nostril, no-Septal dev, mucus, polyps, erosion, perforation             Throat- Mallampati II , mucosa clear , drainage- none, tonsils- atrophic Neck- flexible , trachea midline, no stridor , thyroid nl, carotid no bruit Chest - symmetrical excursion , unlabored           Heart/CV- RRR , no murmur ,  no gallop  , no rub, nl s1 s2                           - JVD- none , edema- none, stasis changes- none, varices- none           Lung- clear to P&A, wheeze- none, cough- none , dullness-none, rub- none           Chest wall-  Abd- Br/ Gen/ Rectal- Not done, not indicated Extrem- cyanosis- none, clubbing, none, atrophy- none, strength- nl Neuro- grossly intact to observation

## 2014-09-17 NOTE — Patient Instructions (Signed)
Script sent for augmentin antibiotic  If antibiotics bother your stomach, it might help to, also take an otc probiotic like Florastor or Align  Consider rinsing your nose, when you need, with a Neti pot or squeeze bottle, as discussed.,  Don't forget that flu shot tomorrow

## 2014-09-18 ENCOUNTER — Encounter: Payer: Self-pay | Admitting: Internal Medicine

## 2014-09-18 ENCOUNTER — Other Ambulatory Visit (INDEPENDENT_AMBULATORY_CARE_PROVIDER_SITE_OTHER): Payer: Commercial Managed Care - HMO

## 2014-09-18 ENCOUNTER — Ambulatory Visit (INDEPENDENT_AMBULATORY_CARE_PROVIDER_SITE_OTHER): Payer: Commercial Managed Care - HMO | Admitting: Internal Medicine

## 2014-09-18 VITALS — BP 138/72 | HR 77 | Temp 98.2°F | Ht 63.0 in | Wt 200.4 lb

## 2014-09-18 DIAGNOSIS — G5602 Carpal tunnel syndrome, left upper limb: Secondary | ICD-10-CM

## 2014-09-18 DIAGNOSIS — D649 Anemia, unspecified: Secondary | ICD-10-CM

## 2014-09-18 DIAGNOSIS — E785 Hyperlipidemia, unspecified: Secondary | ICD-10-CM

## 2014-09-18 DIAGNOSIS — G5600 Carpal tunnel syndrome, unspecified upper limb: Secondary | ICD-10-CM | POA: Insufficient documentation

## 2014-09-18 DIAGNOSIS — I10 Essential (primary) hypertension: Secondary | ICD-10-CM

## 2014-09-18 LAB — CBC WITH DIFFERENTIAL/PLATELET
BASOS ABS: 0.2 10*3/uL — AB (ref 0.0–0.1)
BASOS PCT: 2.2 % (ref 0.0–3.0)
EOS ABS: 0.6 10*3/uL (ref 0.0–0.7)
Eosinophils Relative: 7.9 % — ABNORMAL HIGH (ref 0.0–5.0)
HCT: 34.2 % — ABNORMAL LOW (ref 36.0–46.0)
Hemoglobin: 11 g/dL — ABNORMAL LOW (ref 12.0–15.0)
LYMPHS PCT: 31.8 % (ref 12.0–46.0)
Lymphs Abs: 2.3 10*3/uL (ref 0.7–4.0)
MCHC: 32.1 g/dL (ref 30.0–36.0)
MCV: 80.5 fl (ref 78.0–100.0)
Monocytes Absolute: 0.6 10*3/uL (ref 0.1–1.0)
Monocytes Relative: 8.7 % (ref 3.0–12.0)
NEUTROS PCT: 49.4 % (ref 43.0–77.0)
Neutro Abs: 3.6 10*3/uL (ref 1.4–7.7)
PLATELETS: 330 10*3/uL (ref 150.0–400.0)
RBC: 4.25 Mil/uL (ref 3.87–5.11)
RDW: 16 % — ABNORMAL HIGH (ref 11.5–15.5)
WBC: 7.2 10*3/uL (ref 4.0–10.5)

## 2014-09-18 LAB — BASIC METABOLIC PANEL
BUN: 25 mg/dL — AB (ref 6–23)
CHLORIDE: 102 meq/L (ref 96–112)
CO2: 29 mEq/L (ref 19–32)
CREATININE: 0.9 mg/dL (ref 0.4–1.2)
Calcium: 9.3 mg/dL (ref 8.4–10.5)
GFR: 82.27 mL/min (ref >60.00)
Glucose, Bld: 91 mg/dL (ref 70–99)
Potassium: 4.2 mEq/L (ref 3.5–5.1)
Sodium: 138 mEq/L (ref 135–145)

## 2014-09-18 LAB — LIPID PANEL
Cholesterol: 172 mg/dL (ref 0–200)
HDL: 53.3 mg/dL (ref >39.00)
LDL Cholesterol: 96 mg/dL (ref 0–99)
NONHDL: 118.7
TRIGLYCERIDES: 115 mg/dL (ref 0.0–149.0)
Total CHOL/HDL Ratio: 3
VLDL: 23 mg/dL (ref 0.0–40.0)

## 2014-09-18 LAB — URINALYSIS, ROUTINE W REFLEX MICROSCOPIC
Bilirubin Urine: NEGATIVE
HGB URINE DIPSTICK: NEGATIVE
Ketones, ur: NEGATIVE
Leukocytes, UA: NEGATIVE
NITRITE: NEGATIVE
Specific Gravity, Urine: 1.02 (ref 1.000–1.030)
UROBILINOGEN UA: 0.2 (ref 0.0–1.0)
Urine Glucose: NEGATIVE
pH: 6 (ref 5.0–8.0)

## 2014-09-18 LAB — HEPATIC FUNCTION PANEL
ALT: 21 U/L (ref 0–35)
AST: 21 U/L (ref 0–37)
Albumin: 3.8 g/dL (ref 3.5–5.2)
Alkaline Phosphatase: 98 U/L (ref 39–117)
Bilirubin, Direct: 0 mg/dL (ref 0.0–0.3)
TOTAL PROTEIN: 6.9 g/dL (ref 6.0–8.3)
Total Bilirubin: 0.4 mg/dL (ref 0.2–1.2)

## 2014-09-18 LAB — TSH: TSH: 0.7 u[IU]/mL (ref 0.35–4.50)

## 2014-09-18 NOTE — Progress Notes (Signed)
Pre visit review using our clinic review tool, if applicable. No additional management support is needed unless otherwise documented below in the visit note. 

## 2014-09-18 NOTE — Patient Instructions (Addendum)
You had the flu shot today  Please wear a left wrist splint at night only until the tingling is better  Please continue all other medications as before, and refills have been done if requested.  Please have the pharmacy call with any other refills you may need.  Please continue your efforts at being more active, low cholesterol diet, and weight control.  Please keep your appointments with your specialists as you may have planned  Please go to the LAB in the Basement (turn left off the elevator) for the tests to be done today  You will be contacted by phone if any changes need to be made immediately.  Otherwise, you will receive a letter about your results with an explanation, but please check with MyChart first.  Please remember to sign up for MyChart if you have not done so, as this will be important to you in the future with finding out test results, communicating by private email, and scheduling acute appointments online when needed.  Please return in 6 months, or sooner if needed

## 2014-09-18 NOTE — Assessment & Plan Note (Signed)
stable overall by history and exam, recent data reviewed with pt, and pt to continue medical treatment as before,  to f/u any worsening symptoms or concerns BP Readings from Last 3 Encounters:  09/18/14 138/72  09/17/14 124/82  08/22/14 116/74

## 2014-09-18 NOTE — Assessment & Plan Note (Signed)
stable overall by history and exam, recent data reviewed with pt, and pt to continue medical treatment as before,  to f/u any worsening symptoms or concerns Lab Results  Component Value Date   LDLCALC 96 08/01/2013

## 2014-09-18 NOTE — Assessment & Plan Note (Signed)
Mild left side tingling, exam benign, for left wrist splint qhs until improved, consider ncs if worsens

## 2014-09-18 NOTE — Progress Notes (Signed)
Subjective:    Patient ID: [name], female    DOB: 02-02-1946, 68 y.o.   MRN: [id number]  HPI  Here to f/u, c/o 2 wks onset mild intermittent left lateral wrist and hand distribution tingling and numbness, and mild itchy sensation, has know mult allergies but no red/swelling or pain,  No hand weakness. No prior hx of same.   Pt denies chest pain, increased sob or doe, wheezing, orthopnea, PND, increased LE swelling, palpitations, dizziness or syncope.  Pt denies new neurological symptoms such as new headache, or facial or extremity weakness or numbness.  Pt denies polydipsia, polyuria, S/p bilat knee cortisone per Dr Tamala Julian x 1 each, doing better. Past Medical History  Diagnosis Date  . HTN (hypertension)   . Hyperlipidemia   . Obesity   . Osteoarthritis   . DJD (degenerative joint disease), lumbar   . Chronic headaches   . GERD (gastroesophageal reflux disease)   . Allergic rhinitis, cause unspecified    Past Surgical History  Procedure Laterality Date  . Tubal ligation      reports that she has never smoked. She has never used smokeless tobacco. She reports that she does not drink alcohol or use illicit drugs. family history includes Diabetes in her brother; Heart disease in her mother; Hypertension in her mother. There is no history of Colon cancer, Esophageal cancer, Rectal cancer, or Stomach cancer. Allergies  Allergen Reactions  . Sulfonamide Derivatives    Current Outpatient Prescriptions on File Prior to Visit  Medication Sig Dispense Refill  . amoxicillin-clavulanate (AUGMENTIN) 875-125 MG per tablet Take 1 tablet by mouth 2 (two) times daily. 14 tablet 1  . atorvastatin (LIPITOR) 20 MG tablet Take 1 tablet (20 mg total) by mouth daily. 90 tablet 3  . azelastine (ASTELIN) 0.1 % nasal spray Place 1-2 sprays into both nostrils at bedtime. 30 mL 5  . cetirizine (ZYRTEC) 10 MG tablet Take 1 tablet (10 mg total) by mouth daily. As needed 90 tablet 3  . diclofenac  (VOLTAREN) 75 MG EC tablet Take 1 tablet (75 mg total) by mouth 2 (two) times daily. 60 tablet 5  . diltiazem (TIAZAC) 120 MG 24 hr capsule Take 1 capsule (120 mg total) by mouth daily. 90 capsule 1  . esomeprazole (NEXIUM) 40 MG capsule Take 1 capsule (40 mg total) by mouth daily at 12 noon. 90 capsule 3  . fluticasone (FLONASE) 50 MCG/ACT nasal spray Place 2 sprays into the nose daily. 16 g 11  . losartan-hydrochlorothiazide (HYZAAR) 100-25 MG per tablet Take 1 tablet by mouth daily. 90 tablet 3  . meloxicam (MOBIC) 15 MG tablet Take 1 tablet (15 mg total) by mouth daily. 30 tablet 0  . metoprolol (LOPRESSOR) 50 MG tablet Take 1 tablet (50 mg total) by mouth 2 (two) times daily. 180 tablet 3  . PENNSAID 2 % SOLN APPLY  TO AFFECTED AREA TOPICALLY TWICE A DAY 112 g 3  . sertraline (ZOLOFT) 50 MG tablet Take 1 tablet (50 mg total) by mouth daily. 90 tablet 3  . traMADol (ULTRAM) 50 MG tablet 1-2 tabs by mouth every 6 hours as needed for pain 120 tablet 5  . triamcinolone cream (KENALOG) 0.1 % Apply topically 2 (two) times daily. 30 g 0  . nystatin (MYCOSTATIN) powder Use as directed twice per day to affected area 15 g 0   No current facility-administered medications on file prior to visit.     Review of Systems  Constitutional: Negative for unusual  diaphoresis or other sweats  HENT: Negative for ringing in ear Eyes: Negative for double vision or worsening visual disturbance.  Respiratory: Negative for choking and stridor.   Gastrointestinal: Negative for vomiting or other signifcant bowel change Genitourinary: Negative for hematuria or decreased urine volume.  Musculoskeletal: Negative for other MSK pain or swelling Skin: Negative for color change and worsening wound.  Neurological: Negative for tremors and numbness other than noted  Psychiatric/Behavioral: Negative for decreased concentration or agitation other than above       Objective:   Physical Exam BP 138/72 mmHg  Pulse 77   Temp(Src) 98.2 F (36.8 C) (Oral)  Ht 5\' 3"  (1.6 m)  Wt 200 lb 6 oz (90.89 kg)  BMI 35.50 kg/m2  SpO2 96% VS noted,  Constitutional: Pt appears well-developed, well-nourished.  HENT: Head: NCAT.  Right Ear: External ear normal.  Left Ear: External ear normal.  Eyes: . Pupils are equal, round, and reactive to light. Conjunctivae and EOM are normal Neck: Normal range of motion. Neck supple.  Cardiovascular: Normal rate and regular rhythm.   Pulmonary/Chest: Effort normal and breath sounds normal.  Abd:  Soft, NT, ND, + BS Neurological: Pt is alert. Not confused , motor intact throughout, sens intact, dtrs symmtric Skin: Skin is warm. No rash Psychiatric: Pt behavior is normal. No agitation.     Assessment & Plan:

## 2014-09-19 ENCOUNTER — Other Ambulatory Visit: Payer: Self-pay | Admitting: Internal Medicine

## 2014-09-19 DIAGNOSIS — D509 Iron deficiency anemia, unspecified: Secondary | ICD-10-CM

## 2014-09-19 LAB — IBC PANEL
Iron: 36 ug/dL — ABNORMAL LOW (ref 42–145)
SATURATION RATIOS: 7.9 % — AB (ref 20.0–50.0)
TRANSFERRIN: 324.1 mg/dL (ref 212.0–360.0)

## 2014-09-19 LAB — VITAMIN B12: Vitamin B-12: 259 pg/mL (ref 211–911)

## 2014-09-20 ENCOUNTER — Other Ambulatory Visit: Payer: Self-pay | Admitting: Internal Medicine

## 2014-09-20 DIAGNOSIS — Z1231 Encounter for screening mammogram for malignant neoplasm of breast: Secondary | ICD-10-CM

## 2014-10-08 ENCOUNTER — Ambulatory Visit: Payer: Commercial Managed Care - HMO | Admitting: Family Medicine

## 2014-10-17 ENCOUNTER — Ambulatory Visit (HOSPITAL_COMMUNITY): Payer: Medicare PPO

## 2014-10-24 ENCOUNTER — Ambulatory Visit (INDEPENDENT_AMBULATORY_CARE_PROVIDER_SITE_OTHER): Payer: Commercial Managed Care - HMO | Admitting: Family Medicine

## 2014-10-24 ENCOUNTER — Encounter: Payer: Self-pay | Admitting: Family Medicine

## 2014-10-24 VITALS — BP 112/76 | HR 59 | Ht 63.0 in | Wt 198.0 lb

## 2014-10-24 DIAGNOSIS — IMO0002 Reserved for concepts with insufficient information to code with codable children: Secondary | ICD-10-CM

## 2014-10-24 DIAGNOSIS — M179 Osteoarthritis of knee, unspecified: Secondary | ICD-10-CM | POA: Diagnosis not present

## 2014-10-24 MED ORDER — MELOXICAM 15 MG PO TABS: 15.0000 mg | ORAL_TABLET | Freq: Every day | ORAL | Status: DC

## 2014-10-24 NOTE — Progress Notes (Signed)
[name] [address], [place] [address] Phone: [phone] Subjective:      CC: Bilateral knee pain follow up   [id number] [name] is a 69 y.o. female coming in with complaint of  bilateral knee pain. 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  Patient at last follow-up did have an injection in her left knee. Patient has now had 2 injections  one in each knee and the last 3 months. Patient states that she is doing relatively well. Patient states that she is able to ambulate better. Still have difficulty with taking stairs. States that she is resting comfortably at night. Denies any radiation down her leg or any new symptoms. Patient continues on the topical anti-inflammatory. Patient states that she is able to do daily activities but recently has had difficulty going up and downstairs again. It has been greater than 3 months since previous steroid injections.  Patient also did have a sprain of her ankle. Patient was given a brace, popcorn temperatures, as well as home exercises. Patient states    Past medical history, social, surgical and family history all reviewed in electronic medical record.   Review of Systems: No headache, visual changes, nausea, vomiting, diarrhea, constipation, dizziness, abdominal pain, skin rash, fevers, chills, night sweats, weight loss, swollen lymph nodes, body aches, joint swelling, muscle aches, chest pain, shortness of breath, mood changes.   Objective Blood pressure 112/76, pulse 59, height 5\' 3"  (1.6 m), weight 198 lb (89.812 kg), SpO2 97 %.  General: No apparent distress alert and oriented x3 mood and affect normal, dressed appropriately. Overweight HEENT: Pupils equal, extraocular movements intact  Respiratory: Patient's speak in full sentences and does not appear short of breath  Cardiovascular: No lower  extremity edema, non tender, no erythema  Skin: Warm dry intact with no signs of infection or rash on extremities or on axial skeleton.  Abdomen: Soft nontender  Neuro: Cranial nerves II through XII are intact, neurovascularly intact in all extremities with 2+ DTRs and 2+ pulses.  Lymph: No lymphadenopathy of posterior or anterior cervical chain or axillae bilaterally.  Gait normal with good balance and coordination.  MSK:  Non tender with full range of motion and good stability and symmetric strength and tone of shoulders, elbows, wrist, hips bilaterally.  Knee: Bilateral Normal to inspection with no erythema or effusion or obvious bony abnormalities. Moderate increase in tenderness over the medial joint line from previous exam. Her range of motion shows the patient lacks less 5 of extension bilaterally mild increase in decreased range of motion on the left compared to the right today. Ligaments with solid consistent endpoints including ACL, PCL, LCL, MCL. Negative Mcmurray's, Apley's, and Thessalonian tests. Patellar glide moderate crepitus. Patellar and quadriceps tendons unremarkable. Hamstring and quadriceps strength is normal.  Mild worsening the left knee on this exam.  Procedure: Real-time Ultrasound Guided Injection of right knee Device: GE Logiq E  Ultrasound guided injection is preferred based studies that show increased duration, increased effect, greater accuracy, decreased procedural pain, increased response rate, and decreased cost with ultrasound guided versus blind injection.  Verbal informed consent obtained.  Time-out conducted.  Noted no overlying erythema, induration, or other signs of local infection.  Skin prepped in a sterile fashion.  Local anesthesia: Topical Ethyl chloride.  With sterile technique and under real time ultrasound guidance: With a 22-gauge 2 inch  needle patient was injected with 4 cc of 0.5% Marcaine and 1 cc of Kenalog 40 mg/dL. This was from a  superior lateral approach.  Completed without difficulty  Pain immediately resolved suggesting accurate placement of the medication.  Advised to call if fevers/chills, erythema, induration, drainage, or persistent bleeding.  Images permanently stored and available for review in the ultrasound unit.  Impression: Technically successful ultrasound guided injection.   Procedure: Real-time Ultrasound Guided Injection of left knee Device: GE Logiq E  Ultrasound guided injection is preferred based studies that show increased duration, increased effect, greater accuracy, decreased procedural pain, increased response rate, and decreased cost with ultrasound guided versus blind injection.  Verbal informed consent obtained.  Time-out conducted.  Noted no overlying erythema, induration, or other signs of local infection.  Skin prepped in a sterile fashion.  Local anesthesia: Topical Ethyl chloride.  With sterile technique and under real time ultrasound guidance: With a 22-gauge 2 inch needle patient was injected with 4 cc of 0.5% Marcaine and 1 cc of Kenalog 40 mg/dL. This was from a superior lateral approach.  Completed without difficulty  Pain immediately resolved suggesting accurate placement of the medication.  Advised to call if fevers/chills, erythema, induration, drainage, or persistent bleeding.  Images permanently stored and available for review in the ultrasound unit.  Impression: Technically successful ultrasound guided injection.   Impression and Recommendations:     This case required medical decision making of moderate complexity.

## 2014-10-24 NOTE — Patient Instructions (Signed)
Happy New Year! Refilled meloxicam 2 injections today.  We can consider the other injections if needed Ice is your friend.  Try exercises for your wrist See me again in 4 weeks.

## 2014-10-24 NOTE — Assessment & Plan Note (Signed)
Ultrasound guided injections of both knees were done today. We discussed icing regimen as well as continue the bracing. Patient given a refill of the topical anti-inflammatories as well as the meloxicam which seems to be beneficial. Patient knows that she skates any stomach pain she will discontinue the medication. Patient has tramadol for any type of breath or pain. Patient will come back again in 4 weeks for further evaluation. Patient would have failed all other conservative therapy and she could be a candidate for viscous supplementation.

## 2014-10-26 ENCOUNTER — Ambulatory Visit (HOSPITAL_COMMUNITY)
Admission: RE | Admit: 2014-10-26 | Discharge: 2014-10-26 | Disposition: A | Discharge status: Home / Self Care | Payer: Commercial Managed Care - HMO | Source: Ambulatory Visit | Attending: Internal Medicine | Admitting: Internal Medicine

## 2014-10-26 DIAGNOSIS — Z1231 Encounter for screening mammogram for malignant neoplasm of breast: Secondary | ICD-10-CM | POA: Diagnosis not present

## 2014-11-06 ENCOUNTER — Other Ambulatory Visit: Payer: Self-pay | Admitting: Internal Medicine

## 2014-11-07 ENCOUNTER — Encounter: Payer: Self-pay | Admitting: *Deleted

## 2014-11-14 ENCOUNTER — Encounter: Payer: Self-pay | Admitting: Internal Medicine

## 2014-11-14 ENCOUNTER — Ambulatory Visit (INDEPENDENT_AMBULATORY_CARE_PROVIDER_SITE_OTHER): Payer: Commercial Managed Care - HMO | Admitting: Internal Medicine

## 2014-11-14 VITALS — BP 112/76 | HR 56 | Ht 60.25 in | Wt 197.1 lb

## 2014-11-14 DIAGNOSIS — K219 Gastro-esophageal reflux disease without esophagitis: Secondary | ICD-10-CM

## 2014-11-14 DIAGNOSIS — R194 Change in bowel habit: Secondary | ICD-10-CM

## 2014-11-14 DIAGNOSIS — Z8719 Personal history of other diseases of the digestive system: Secondary | ICD-10-CM | POA: Diagnosis not present

## 2014-11-14 DIAGNOSIS — R151 Fecal smearing: Secondary | ICD-10-CM

## 2014-11-14 DIAGNOSIS — D509 Iron deficiency anemia, unspecified: Secondary | ICD-10-CM | POA: Diagnosis not present

## 2014-11-14 DIAGNOSIS — K648 Other hemorrhoids: Secondary | ICD-10-CM | POA: Diagnosis not present

## 2014-11-14 MED ORDER — ESOMEPRAZOLE MAGNESIUM 40 MG PO CPDR: 40.0000 mg | DELAYED_RELEASE_CAPSULE | Freq: Every day | ORAL | Status: DC

## 2014-11-14 MED ORDER — FERROUS SULFATE 325 (65 FE) MG PO TABS: 325.0000 mg | ORAL_TABLET | Freq: Every day | ORAL | Status: DC

## 2014-11-14 MED ORDER — MOVIPREP 100 G PO SOLR: 1.0000 | Freq: Once | ORAL | Status: DC

## 2014-11-14 NOTE — Progress Notes (Signed)
Subjective:    Patient ID: [name], female    DOB: 1946/02/25, 69 y.o.   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 Jenny Reichmann to evaluate a new iron deficiency anemia. She is here alone today. She was last seen in the office in June 2013. She had upper endoscopy and colonoscopy on 03/10/2012. Her EGD revealed mild tortuosity in the distal esophagus and empiric dilation was performed, small hiatal hernia, mild to moderate gastritis which was negative for H. pylori and dysplasia, and an unremarkable duodenum. Colonoscopy revealed mild diverticular disease and a small cecal adenoma.  Today she reports that overall she is feeling well though she has noted a change in her bowel habit. She reports on one occasion she had bright red blood per rectum with wiping. She also noticed this on the top of the stool. Her stools have been thinner than normal for her though not every bowel movement. She does report a history of hemorrhoids which on occasion can prolapse and require manual reduction. She also notes some perianal itching and mild fecal smearing. No significant diarrhea. She does report abdominal bloating after eating though her appetite has been good. She reports issues with nausea but no vomiting. She denies early satiety. She is buying over-the-counter Nexium 20 mg which she is using daily. After recent lab tests she bought over-the-counter oral iron which is ferrous sulfate 220 mg and she is taking this once daily. Stools have been darker with oral iron but were not dark prior to oral iron. She is using meloxicam daily for joint pains. She denies issues with dysphagia but apparently has breakthrough heartburn despite Nexium 20 mg daily.  Review of Systems As per history of present illness, otherwise negative  Current Medications,  Allergies, Past Medical History, Past Surgical History, Family History and Social History were reviewed in [employer] record.     Objective:   Physical Exam BP 112/76 mmHg  Pulse 56  Ht 5' 0.25" (1.53 m)  Wt 197 lb 2 oz (89.415 kg)  BMI 38.20 kg/m2 Constitutional: Well-developed and well-nourished. No distress. HEENT: Normocephalic and atraumatic. Oropharynx is clear and moist. No oropharyngeal exudate. Conjunctivae are normal.  No scleral icterus. Neck: Neck supple. Trachea midline. Cardiovascular: Normal rate, regular rhythm and intact distal pulses. No M/R/G Pulmonary/chest: Effort normal and breath sounds normal. No wheezing, rales or rhonchi. Abdominal: Soft, nontender, nondistended. Bowel sounds active throughout. There are no masses palpable. No hepatosplenomegaly. Extremities: no clubbing, cyanosis, or edema Lymphadenopathy: No cervical adenopathy noted. Neurological: Alert and oriented to person place and time. Skin: Skin is warm and dry. No rashes noted. Psychiatric: Normal mood and affect. Behavior is normal.  CBC    Component Value Date/Time   WBC 7.2 09/18/2014 0951   RBC 4.25 09/18/2014 0951   HGB 11.0* 09/18/2014 0951   HCT 34.2* 09/18/2014 0951   PLT 330.0 09/18/2014 0951   MCV 80.5 09/18/2014 0951   MCHC 32.1 09/18/2014 0951   RDW 16.0* 09/18/2014 0951   LYMPHSABS 2.3 09/18/2014 0951   MONOABS 0.6 09/18/2014 0951   EOSABS 0.6 09/18/2014 0951   BASOSABS 0.2* 09/18/2014 0951   Iron/TIBC/Ferritin/ %Sat    Component Value Date/Time   IRON 36* 09/18/2014 1426   IRONPCTSAT 7.9* 09/18/2014 1426   Lab Results  Component Value Date   TSH 0.70  09/18/2014   EGD/colonoscopy reviewed    Assessment & Plan:   69 yo female with PMH of GERD, H. pylori negative gastritis, adenomatous colon polyps, hypertension, hyperlipidemia, obesity who is seen in follow-up at the request of Dr. Jenny Reichmann to evaluate a new iron deficiency anemia.   1. IDA --  her iron deficiency anemia is new and thus concerning. She has a history of gastritis and uses NSAID so it is possible she is having bleeding from the stomach. Does not appear she has had overt bleeding. I recommended upper endoscopy for direct visualization and increasing Nexium to 40 mg daily. I also recommended that she increase oral iron to 325 mg once daily. Prescriptions to be provided. We'll also repeat colonoscopy see #3  2. Heartburn/nausea -- we'll increase Nexium as discussed above for better control of heartburn and also direct visualization of the stomach given history of gastritis and nausea. We discussed EGD including the risks and benefits and she is agreeable to proceed  3. Change in bowel habits/IDA/history of colon polyps -- repeat colonoscopy for these 3 reasons. Test discussed including risks and benefits and she is agreeable to proceed  4. Rectal bleeding/internal hemorrhoids -- rectal bleeding is felt most likely to be due to internal hemorrhoids is seen in the time of last colonoscopy. We'll confirm at upcoming colonoscopy and if hemorrhoids present will recommend banding which should improve intermittent rectal bleeding, fecal smearing and itching.

## 2014-11-14 NOTE — Patient Instructions (Addendum)
You have been scheduled for an endoscopy and colonoscopy. Please follow the written instructions given to you at your visit today. Please pick up your prep at the pharmacy within the next 1-3 days. If you use inhalers (even only as needed), please bring them with you on the day of your procedure. Your physician has requested that you go to www.startemmi.com and enter the access code given to you at your visit today. This web site gives a general overview about your procedure. However, you should still follow specific instructions given to you by our office regarding your preparation for the procedure.  We have sent the following medications to your pharmacy for you to pick up at your convenience: Iron once daily, Esomeprazole 40mg  daily  You have been scheduled for a hemorrhoidal banding 01-08-15 @ 1:45pm at Dr. Vena Rua office.  CC: Dr [name]

## 2014-11-16 ENCOUNTER — Telehealth: Payer: Self-pay | Admitting: Internal Medicine

## 2014-11-16 NOTE — Telephone Encounter (Signed)
Advised patient that I have a 10 dollar rebate coupon or could change her prep. She prefers to change prep. I have sent instructions for miralax prep instead and patient will contact me when these come in the mail so we can go over them verbally.

## 2014-11-20 ENCOUNTER — Telehealth: Payer: Self-pay | Admitting: Internal Medicine

## 2014-11-20 NOTE — Telephone Encounter (Signed)
Discussed with pt her new prep instructions. Instructed pt to take the instructions with her to the pharmacy to make sure she got the miralax and the dulcolax. Pt verbalized understanding.

## 2014-11-22 ENCOUNTER — Ambulatory Visit: Payer: Commercial Managed Care - HMO | Admitting: Internal Medicine

## 2014-12-10 ENCOUNTER — Ambulatory Visit (AMBULATORY_SURGERY_CENTER): Payer: Commercial Managed Care - HMO | Admitting: Internal Medicine

## 2014-12-10 ENCOUNTER — Encounter: Payer: Self-pay | Admitting: Internal Medicine

## 2014-12-10 VITALS — BP 123/62 | HR 58 | Temp 97.9°F | Resp 16 | Ht 60.25 in | Wt 197.0 lb

## 2014-12-10 DIAGNOSIS — R194 Change in bowel habit: Secondary | ICD-10-CM

## 2014-12-10 DIAGNOSIS — E669 Obesity, unspecified: Secondary | ICD-10-CM | POA: Diagnosis not present

## 2014-12-10 DIAGNOSIS — I10 Essential (primary) hypertension: Secondary | ICD-10-CM | POA: Diagnosis not present

## 2014-12-10 DIAGNOSIS — Z86010 Personal history of colonic polyps: Secondary | ICD-10-CM

## 2014-12-10 DIAGNOSIS — D124 Benign neoplasm of descending colon: Secondary | ICD-10-CM

## 2014-12-10 DIAGNOSIS — D649 Anemia, unspecified: Secondary | ICD-10-CM | POA: Diagnosis not present

## 2014-12-10 DIAGNOSIS — D509 Iron deficiency anemia, unspecified: Secondary | ICD-10-CM

## 2014-12-10 DIAGNOSIS — K2990 Gastroduodenitis, unspecified, without bleeding: Secondary | ICD-10-CM

## 2014-12-10 DIAGNOSIS — K625 Hemorrhage of anus and rectum: Secondary | ICD-10-CM

## 2014-12-10 DIAGNOSIS — K2950 Unspecified chronic gastritis without bleeding: Secondary | ICD-10-CM

## 2014-12-10 DIAGNOSIS — K2970 Gastritis, unspecified, without bleeding: Secondary | ICD-10-CM | POA: Diagnosis not present

## 2014-12-10 MED ORDER — SUCRALFATE 1 G PO TABS: 1.0000 g | ORAL_TABLET | Freq: Three times a day (TID) | ORAL | Status: DC

## 2014-12-10 MED ORDER — SODIUM CHLORIDE 0.9 % IV SOLN: 500.0000 mL | INTRAVENOUS | Status: DC

## 2014-12-10 NOTE — Op Note (Signed)
[place]  [address] [place], [address]   COLONOSCOPY PROCEDURE REPORT  PATIENT: [name], [name]  MR#: [id number] BIRTHDATE: 1946/02/13 , 36  yrs. old GENDER: female ENDOSCOPIST: Jerene Bears, MD REFERRED [id number] John, M.D. PROCEDURE DATE:  12/10/2014 PROCEDURE:   Colonoscopy with snare polypectomy First Screening Colonoscopy - Avg.  risk and is 50 yrs.  old or older - No.  Prior Negative Screening - Now for repeat screening. N/A  History of Adenoma - Now for follow-up colonoscopy & has been > or = to 3 yrs.  Yes hx of adenoma.  Has been 3 or more years since last colonoscopy.  Polyps Removed Today? Yes. ASA CLASS:   Class III INDICATIONS:iron deficiency anemia, high risk patient with personal history of colonic polyps, change in bowel habits, and rectal bleeding. MEDICATIONS: Monitored anesthesia care, Propofol 150 mg IV, and Residual sedation present  DESCRIPTION OF PROCEDURE:   After the risks benefits and alternatives of the procedure were thoroughly explained, informed consent was obtained.  The digital rectal exam revealed no rectal mass.   The LB PFC-H190 [id number]  endoscope was introduced through the anus and advanced to the terminal ileum which was intubated for a short distance. No adverse events experienced.   The quality of the prep was good, using MoviPrep  The instrument was then slowly withdrawn as the colon was fully examined.   COLON FINDINGS: The examined terminal ileum appeared to be normal. A sessile polyp measuring 5 mm in size was found in the descending colon.  A polypectomy was performed with a cold snare.  The resection was complete, the polyp tissue was completely retrieved and sent to histology.   There was mild diverticulosis noted in the descending colon and sigmoid colon.  Retroflexed views revealed internal hemorrhoids. The time to cecum=3 minutes 13 seconds. Withdrawal time=9 minutes 12 seconds.  The scope was withdrawn  and the procedure completed. COMPLICATIONS: There were no immediate complications.  ENDOSCOPIC IMPRESSION: 1.   The examined terminal ileum appeared to be normal 2.   Sessile polyp was found in the descending colon; polypectomy was performed with a cold snare 3.   Mild diverticulosis was noted in the descending colon and sigmoid colon  RECOMMENDATIONS: 1.  Await pathology results 2.  High fiber diet 3.  Repeat Colonoscopy in 5 years. 4.  You will receive a letter within 1-2 weeks with the results of your biopsy as well as final recommendations.  Please call my office if you have not received a letter after 3 weeks. 5.  Schedule hemorrhoidal banding #1 for symptomatic internal hemorrhoids 6.  Await pathology from upper endoscopy, and if unrevealing would proceed with video capsule endoscopy given iron def anemia  eSigned:  Jerene Bears, MD 12/10/2014 3:43 PM   cc: The Patient and Biagio Borg, MD   PATIENT NAME:  [name], [name] MR#: [id number]

## 2014-12-10 NOTE — Progress Notes (Signed)
A/ox3, pleased with MAC, report to RN 

## 2014-12-10 NOTE — Op Note (Signed)
[facility]  [address]. [place], [address]   ENDOSCOPY PROCEDURE REPORT  PATIENT: Narmeen, Tenbrink  MR#: [id number] BIRTHDATE: May 31, 1946 , 74  yrs. old GENDER: female ENDOSCOPIST: Jerene Bears, MD REFERRED BY:  [name], M.D. PROCEDURE DATE:  12/10/2014 PROCEDURE:  EGD w/ biopsy for H.pylori ASA CLASS:     Class III INDICATIONS:  iron deficiency anemia and nausea. MEDICATIONS: Monitored anesthesia care and Propofol 150 mg IV TOPICAL ANESTHETIC: none  DESCRIPTION OF PROCEDURE: After the risks benefits and alternatives of the procedure were thoroughly explained, informed consent was obtained.  The LB GIF-H180 Loaner [id number] endoscope was introduced through the mouth and advanced to the second portion of the duodenum , Without limitations.  The instrument was slowly withdrawn as the mucosa was fully examined.  ESOPHAGUS: The mucosa of the esophagus appeared normal.  STOMACH: Moderate acute gastritis (inflammation) was found in the prepyloric region of the stomach and gastric antrum.  Multiple biopsies were performed using cold forceps from the gastric body, antrum and angularis.   A few small sessile polyps were found in the gastric fundus and cardia consistent with benign fundic gland polyps.  DUODENUM: The duodenal mucosa showed no abnormalities in the bulb and 2nd part of the duodenum.  Retroflexed views revealed a hiatal hernia.     The scope was then withdrawn from the patient and the procedure completed. COMPLICATIONS: There were no immediate complications.  ENDOSCOPIC IMPRESSION: 1.   The mucosa of the esophagus appeared normal 2.   Acute gastritis (inflammation) was found in the prepyloric region of the stomach and gastric antrum; multiple biopsies were performed 3.   Few small, benign, sessile polyps were found in the gastric fundus and cardia 4.   The duodenal mucosa showed no abnormalities in the bulb and 2nd part of the  duodenum  RECOMMENDATIONS: 1.  Await biopsy results 2.  Avoid NSAIDs 3.  Follow-up of helicobacter pylori status, treat if indicated 4.  Continue taking your PPI (Nexium 40 mg) once daily.  It is best to be taken 20-30 minutes prior to breakfast meal. 5.  Add Carafate 1 g (tablet) before meals  eSigned:  Jerene Bears, MD 12/10/2014 3:39 PM     [id number] Quin Hoop, MD and The Patient

## 2014-12-10 NOTE — Progress Notes (Signed)
Called to room to assist during endoscopic procedure.  Patient ID and intended procedure confirmed with present staff. Received instructions for my participation in the procedure from the performing physician.  

## 2014-12-10 NOTE — Patient Instructions (Signed)
AVOID NSAIDS  (MOTRIN, ADVIL,ALEVE, IBUPROFEN, ETC) CONTINUE ON YOUR NEXIUM. IT IS BEST TO TAKE THIS MEDICATION 20-30 MINUTES PRIOR TO MORNING MEAL. ADD cARAFATE 1 GM. TABLET BEFORE MEALS.      YOU HAD AN ENDOSCOPIC PROCEDURE TODAY AT [facility]: Refer to the procedure report that was given to you for any specific questions about what was found during the examination.  If the procedure report does not answer your questions, please call your gastroenterologist to clarify.  If you requested that your care partner not be given the details of your procedure findings, then the procedure report has been included in a sealed envelope for you to review at your convenience later.  YOU SHOULD EXPECT: Some feelings of bloating in the abdomen. Passage of more gas than usual.  Walking can help get rid of the air that was put into your GI tract during the procedure and reduce the bloating. If you had a lower endoscopy (such as a colonoscopy or flexible sigmoidoscopy) you may notice spotting of blood in your stool or on the toilet paper. If you underwent a bowel prep for your procedure, then you may not have a normal bowel movement for a few days.  DIET: Your first meal following the procedure should be a light meal and then it is ok to progress to your normal diet.  A half-sandwich or bowl of soup is an example of a good first meal.  Heavy or fried foods are harder to digest and may make you feel nauseous or bloated.  Likewise meals heavy in dairy and vegetables can cause extra gas to form and this can also increase the bloating.  Drink plenty of fluids but you should avoid alcoholic beverages for 24 hours.  ACTIVITY: Your care partner should take you home directly after the procedure.  You should plan to take it easy, moving slowly for the rest of the day.  You can resume normal activity the day after the procedure however you should NOT DRIVE or use heavy machinery for 24 hours (because of the  sedation medicines used during the test).    SYMPTOMS TO REPORT IMMEDIATELY: A gastroenterologist can be reached at any hour.  During normal business hours, 8:30 AM to 5:00 PM Monday through Friday, call [phone].  After hours and on weekends, please call the GI answering service at [phone] who will take a message and have the physician on call contact you.   Following lower endoscopy (colonoscopy or flexible sigmoidoscopy):  Excessive amounts of blood in the stool  Significant tenderness or worsening of abdominal pains  Swelling of the abdomen that is new, acute  Fever of 100F or higher  Following upper endoscopy (EGD)  Vomiting of blood or coffee ground material  New chest pain or pain under the shoulder blades  Painful or persistently difficult swallowing  New shortness of breath  Fever of 100F or higher  Black, tarry-looking stools  FOLLOW UP: If any biopsies were taken you will be contacted by phone or by letter within the next 1-3 weeks.  Call your gastroenterologist if you have not heard about the biopsies in 3 weeks.  Our staff will call the home number listed on your records the next business day following your procedure to check on you and address any questions or concerns that you may have at that time regarding the information given to you following your procedure. This is a courtesy call and so if there is no answer at  the home number and we have not heard from you through the emergency physician on call, we will assume that you have returned to your regular daily activities without incident.  SIGNATURES/CONFIDENTIALITY: You and/or your care partner have signed paperwork which will be entered into your electronic medical record.  These signatures attest to the fact that that the information above on your After Visit Summary has been reviewed and is understood.  Full responsibility of the confidentiality of this discharge information lies with you and/or your  care-partner.

## 2014-12-11 ENCOUNTER — Telehealth: Payer: Self-pay | Admitting: *Deleted

## 2014-12-11 NOTE — Telephone Encounter (Signed)
  Follow up Call-  Call back number 12/10/2014  Post procedure Call Back phone  # [phone]  Permission to leave phone message Yes     Patient questions:  Do you have a fever, pain , or abdominal swelling? No. Pain Score  0 *  Have you tolerated food without any problems? Yes.    Have you been able to return to your normal activities? Yes.    Do you have any questions about your discharge instructions: Diet   No. Medications  No. Follow up visit  No.  Do you have questions or concerns about your Care? No.  Actions: * If pain score is 4 or above: No action needed, pain <4.

## 2014-12-17 ENCOUNTER — Encounter: Payer: Self-pay | Admitting: Internal Medicine

## 2014-12-20 ENCOUNTER — Telehealth: Payer: Self-pay

## 2014-12-20 ENCOUNTER — Other Ambulatory Visit: Payer: Self-pay

## 2014-12-20 DIAGNOSIS — D509 Iron deficiency anemia, unspecified: Secondary | ICD-10-CM

## 2014-12-20 NOTE — Telephone Encounter (Signed)
-----   Message from Jerene Bears, MD sent at 12/17/2014 10:44 AM EST ----- Needs VCE for IDA Thanks JMP

## 2014-12-20 NOTE — Telephone Encounter (Signed)
Pt scheduled for teaching 01/08/15@2pm , capsule endo scheduled for 01/15/15. Pt aware of appt.

## 2014-12-28 ENCOUNTER — Ambulatory Visit (INDEPENDENT_AMBULATORY_CARE_PROVIDER_SITE_OTHER): Payer: Commercial Managed Care - HMO | Admitting: Family Medicine

## 2014-12-28 ENCOUNTER — Encounter: Payer: Self-pay | Admitting: Family Medicine

## 2014-12-28 VITALS — BP 128/82 | HR 64 | Wt 203.0 lb

## 2014-12-28 DIAGNOSIS — M1710 Unilateral primary osteoarthritis, unspecified knee: Secondary | ICD-10-CM | POA: Diagnosis not present

## 2014-12-28 NOTE — Progress Notes (Signed)
  [name] [address], [place] [address] Phone: [phone] Subjective:      CC: Bilateral knee pain follow up   [id number] [name] is a 69 y.o. female coming in with complaint of  bilateral knee pain. 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  Patient was seen 3 weeks ago and was given corticosteroid injections. Patient states since that time she is a proximal a 90% better. Patient is able to do daily activities and is walking a significant amount. Still has some uncomfortable feeling with walking long distances. Denies any clicking, popping or giving out on her. Patient states that they do feel stable at this time. Patient is continuing the exercises intermittently. Continues to do the icing. Continues the over-the-counter natural supplementations as well.    Past medical history, social, surgical and family history all reviewed in electronic medical record.   Review of Systems: No headache, visual changes, nausea, vomiting, diarrhea, constipation, dizziness, abdominal pain, skin rash, fevers, chills, night sweats, weight loss, swollen lymph nodes, body aches, joint swelling, muscle aches, chest pain, shortness of breath, mood changes.   Objective Blood pressure 128/82, pulse 64, weight 203 lb (92.08 kg), SpO2 97 %.  General: No apparent distress alert and oriented x3 mood and affect normal, dressed appropriately. Overweight HEENT: Pupils equal, extraocular movements intact  Respiratory: Patient's speak in full sentences and does not appear short of breath  Cardiovascular: No lower extremity edema, non tender, no erythema  Skin: Warm dry intact with no signs of infection or rash on extremities or on axial skeleton.  Abdomen: Soft nontender  Neuro: Cranial nerves II through XII are intact, neurovascularly intact in all extremities with 2+ DTRs and  2+ pulses.  Lymph: No lymphadenopathy of posterior or anterior cervical chain or axillae bilaterally.  Gait normal with good balance and coordination.  MSK:  Non tender with full range of motion and good stability and symmetric strength and tone of shoulders, elbows, wrist, hips bilaterally.  Knee: Bilateral Normal to inspection with no erythema or effusion or obvious bony abnormalities. Minimal tenderness over the medial joint line Improvement in range of motion. Ligaments with solid consistent endpoints including ACL, PCL, LCL, MCL. Negative Mcmurray's, Apley's, and Thessalonian tests. Patellar glide moderate crepitus. Patellar and quadriceps tendons unremarkable. Hamstring and quadriceps strength is normal.       Impression and Recommendations:     This case required medical decision making of moderate complexity.

## 2014-12-28 NOTE — Patient Instructions (Addendum)
Good to see you You are doing great! Ice after activity Tylenol (acetaminophen) 650mg  3 times daily can help Meloixcam only when needed Continue the exercises 2-3 times a week See me again in 2 months to make sure you are doing well and if not we can repeat the steroid injection

## 2014-12-28 NOTE — Assessment & Plan Note (Signed)
Doing much better overall. Encourage patient to continue conservative therapy and follow-up with me in 2 months. If worsening symptoms we will consider repeat crit a steroid injection. Patient would be a candidate for viscous up rotation of that she ever needed. Patient encouraged to continue the icing regimen as well. Patient given another handout of topical anti-inflammatory to try. Patient will avoid oral anti-inflammatories secondary to her recent worsening of gastroesophageal reflux disease. Once again patient will be back in 2 months.

## 2014-12-28 NOTE — Progress Notes (Signed)
Pre visit review using our clinic review tool, if applicable. No additional management support is needed unless otherwise documented below in the visit note. 

## 2014-12-28 NOTE — Assessment & Plan Note (Signed)
As stated above. 

## 2015-01-04 ENCOUNTER — Telehealth: Payer: Self-pay

## 2015-01-04 NOTE — Telephone Encounter (Signed)
No answer, no way to leave message about flu vaccine

## 2015-01-08 ENCOUNTER — Ambulatory Visit (INDEPENDENT_AMBULATORY_CARE_PROVIDER_SITE_OTHER): Payer: Commercial Managed Care - HMO | Admitting: Internal Medicine

## 2015-01-08 ENCOUNTER — Encounter: Payer: Self-pay | Admitting: Internal Medicine

## 2015-01-08 VITALS — BP 128/80 | HR 76 | Ht 60.25 in | Wt 203.0 lb

## 2015-01-08 DIAGNOSIS — K648 Other hemorrhoids: Secondary | ICD-10-CM

## 2015-01-08 NOTE — Patient Instructions (Signed)
You have been scheduled for your 2nd hemorrhoidal banding with Dr Hilarie Fredrickson on Tuesday 01/29/15 @4 :00 pm.  You have been scheduled for your 3rd hemorrhoidal banding with Dr Hilarie Fredrickson on Monday, 02/25/15 @ 4:00 pm.   HEMORRHOID BANDING PROCEDURE    FOLLOW-UP CARE   1. The procedure you have had should have been relatively painless since the banding of the area involved does not have nerve endings and there is no pain sensation.  The rubber band cuts off the blood supply to the hemorrhoid and the band may fall off as soon as 48 hours after the banding (the band may occasionally be seen in the toilet bowl following a bowel movement). You may notice a temporary feeling of fullness in the rectum which should respond adequately to plain Tylenol or Motrin.  2. Following the banding, avoid strenuous exercise that evening and resume full activity the next day.  A sitz bath (soaking in a warm tub) or bidet is soothing, and can be useful for cleansing the area after bowel movements.     3. To avoid constipation, take two tablespoons of natural wheat bran, natural oat bran, flax, Benefiber or any over the counter fiber supplement and increase your water intake to 7-8 glasses daily.    4. Unless you have been prescribed anorectal medication, do not put anything inside your rectum for two weeks: No suppositories, enemas, fingers, etc.  5. Occasionally, you may have more bleeding than usual after the banding procedure.  This is often from the untreated hemorrhoids rather than the treated one.  Don't be concerned if there is a tablespoon or so of blood.  If there is more blood than this, lie flat with your bottom higher than your head and apply an ice pack to the area. If the bleeding does not stop within a half an hour or if you feel faint, call our office at [phone] or go to the emergency room.  6. Problems are not common; however, if there is a substantial amount of bleeding, severe pain, chills, fever or  difficulty passing urine (very rare) or other problems, you should call us at [phone] or report to the nearest emergency room.  7. Do not stay seated continuously for more than 2-3 hours for a day or two after the procedure.  Tighten your buttock muscles 10-15 times every two hours and take 10-15 deep breaths every 1-2 hours.  Do not spend more than a few minutes on the toilet if you cannot empty your bowel; instead re-visit the toilet at a later time.

## 2015-01-08 NOTE — Progress Notes (Signed)
Patient ID: [name], female   DOB: 1946/03/13, 69 y.o.   MRN: [id number]  [name] returns today after recent evaluation of iron deficiency anemia which included upper endoscopy and colonoscopy. Video capsule endoscopy has been recommended and is scheduled for next week She has also had symptomatic grade 2 internal hemorrhoids manifested primarily as bleeding. She does also report anal burning and at times seepage and smearing.  PROCEDURE NOTE: The patient presents with symptomatic grade 2 internal hemorrhoids, requesting rubber band ligation of her hemorrhoidal disease.  All risks, benefits and alternative forms of therapy were described and informed consent was obtained.  In the Left Lateral Decubitus position anoscopic examination revealed grade 2 hemorrhoids in the RA>LL position.  The anorectum was pre-medicated with 0.125% lidocaine The decision was made to band the RA (band #1) internal hemorrhoid, and the [place] was used to perform band ligation without complication.  Digital anorectal examination was then performed to assure proper positioning of the band, and to adjust the banded tissue as required.  The patient was discharged home without pain or other issues.  Dietary and behavioral recommendations were given and along with follow-up instructions.     The following adjunctive treatments were recommended: Stool softeners as needed  The patient will return in *2-4 weeks for  follow-up and possible additional banding as required. No complications were encountered and the patient tolerated the procedure well.

## 2015-01-21 ENCOUNTER — Other Ambulatory Visit: Payer: Self-pay | Admitting: *Deleted

## 2015-01-21 ENCOUNTER — Ambulatory Visit (INDEPENDENT_AMBULATORY_CARE_PROVIDER_SITE_OTHER): Payer: Commercial Managed Care - HMO | Admitting: Internal Medicine

## 2015-01-21 DIAGNOSIS — D509 Iron deficiency anemia, unspecified: Secondary | ICD-10-CM

## 2015-01-21 DIAGNOSIS — K2970 Gastritis, unspecified, without bleeding: Secondary | ICD-10-CM

## 2015-01-21 DIAGNOSIS — K2990 Gastroduodenitis, unspecified, without bleeding: Principal | ICD-10-CM

## 2015-01-21 MED ORDER — SUCRALFATE 1 G PO TABS: 1.0000 g | ORAL_TABLET | Freq: Three times a day (TID) | ORAL | Status: DC

## 2015-01-21 NOTE — Progress Notes (Signed)
Patient arrived for SBCE . She did her prep and has been NPO. Patient swallowed capsule without difficulty. [id number] Expires 2017-06. Patient was given verbal and written instructions. Verbalized understanding.

## 2015-01-23 ENCOUNTER — Telehealth: Payer: Self-pay | Admitting: Internal Medicine

## 2015-01-23 NOTE — Telephone Encounter (Signed)
Pt was told to stop her iron pills prior to her capsule endo. Pt wanted to know if she can resume the iron. Discussed with pt that she can resume the iron, pt verbalized understanding.

## 2015-01-24 ENCOUNTER — Telehealth: Payer: Self-pay | Admitting: Internal Medicine

## 2015-01-24 NOTE — Telephone Encounter (Signed)
Pt states she already has carafate at home and does not need the script that was called in yesterday. Discussed with there that she did not have to pick it up. She verbalized understanding.

## 2015-01-28 ENCOUNTER — Telehealth: Payer: Self-pay | Admitting: *Deleted

## 2015-01-28 NOTE — Telephone Encounter (Signed)
-----   Message from Hulan Saas, RN sent at 01/21/2015 10:42 AM EDT ----- Call patient to see if she passed capsule.

## 2015-01-28 NOTE — Telephone Encounter (Signed)
Spoke with patient and she saw the white pill in her stool the day after the procedure.

## 2015-01-28 NOTE — Telephone Encounter (Signed)
Ok, thanks.

## 2015-01-29 ENCOUNTER — Ambulatory Visit (INDEPENDENT_AMBULATORY_CARE_PROVIDER_SITE_OTHER): Payer: Commercial Managed Care - HMO | Admitting: Internal Medicine

## 2015-01-29 ENCOUNTER — Encounter: Payer: Self-pay | Admitting: Internal Medicine

## 2015-01-29 VITALS — BP 124/84 | HR 64 | Ht 60.25 in | Wt 203.5 lb

## 2015-01-29 DIAGNOSIS — K648 Other hemorrhoids: Secondary | ICD-10-CM | POA: Diagnosis not present

## 2015-01-29 DIAGNOSIS — D509 Iron deficiency anemia, unspecified: Secondary | ICD-10-CM

## 2015-01-29 NOTE — Patient Instructions (Signed)
You have been scheduled for your 3rd hemorrhoidal banding on 02/25/15 @ 4:00 pm.  HEMORRHOID BANDING PROCEDURE    FOLLOW-UP CARE   1. The procedure you have had should have been relatively painless since the banding of the area involved does not have nerve endings and there is no pain sensation.  The rubber band cuts off the blood supply to the hemorrhoid and the band may fall off as soon as 48 hours after the banding (the band may occasionally be seen in the toilet bowl following a bowel movement). You may notice a temporary feeling of fullness in the rectum which should respond adequately to plain Tylenol or Motrin.  2. Following the banding, avoid strenuous exercise that evening and resume full activity the next day.  A sitz bath (soaking in a warm tub) or bidet is soothing, and can be useful for cleansing the area after bowel movements.     3. To avoid constipation, take two tablespoons of natural wheat bran, natural oat bran, flax, Benefiber or any over the counter fiber supplement and increase your water intake to 7-8 glasses daily.    4. Unless you have been prescribed anorectal medication, do not put anything inside your rectum for two weeks: No suppositories, enemas, fingers, etc.  5. Occasionally, you may have more bleeding than usual after the banding procedure.  This is often from the untreated hemorrhoids rather than the treated one.  Don't be concerned if there is a tablespoon or so of blood.  If there is more blood than this, lie flat with your bottom higher than your head and apply an ice pack to the area. If the bleeding does not stop within a half an hour or if you feel faint, call our office at [phone] or go to the emergency room.  6. Problems are not common; however, if there is a substantial amount of bleeding, severe pain, chills, fever or difficulty passing urine (very rare) or other problems, you should call us at [phone] or report to the nearest emergency  room.  7. Do not stay seated continuously for more than 2-3 hours for a day or two after the procedure.  Tighten your buttock muscles 10-15 times every two hours and take 10-15 deep breaths every 1-2 hours.  Do not spend more than a few minutes on the toilet if you cannot empty your bowel; instead re-visit the toilet at a later time.

## 2015-01-29 NOTE — Progress Notes (Signed)
Patient ID: [name], female   DOB: 26-Jan-1946, 69 y.o.   MRN: [id number] PROCEDURE NOTE:  [name] returns today to consider repeat banding for symptomatic, bleeding grade 2 internal hemorrhoids Also with history of iron deficiency and gastritis Video capsule endoscopy was performed and reviewed with her today this was normal She continues on daily PPI and also Carafate Tolerated the hemorrhoidal banding #1 very well. Has had much less rectal bleeding since band one was placed  The patient presents with symptomatic grade 2 internal hemorrhoids, requesting rubber band ligation of her hemorrhoidal disease.  All risks, benefits and alternative forms of therapy were described and informed consent was obtained.    The anorectum was pre-medicated with 0.125% nitroglycerin The decision was made to band the left lateral internal hemorrhoid (RA banded #1), and the [place] was used to perform band ligation without complication.  Digital anorectal examination was then performed to assure proper positioning of the band, and to adjust the banded tissue as required.  The patient was discharged home without pain or other issues.  Dietary and behavioral recommendations were given and along with follow-up instructions.     The following adjunctive treatments were recommended: --Continue daily PPI --Can discontinue Carafate and use on an as-needed basis --Consider repeat CBC and iron studies at follow-up   The patient will return in 3-4 weeks for  follow-up and possible additional banding as required. No complications were encountered and the patient tolerated the procedure well.

## 2015-02-07 ENCOUNTER — Telehealth: Payer: Self-pay | Admitting: Family Medicine

## 2015-02-07 NOTE — Telephone Encounter (Signed)
error 

## 2015-02-18 ENCOUNTER — Telehealth: Payer: Self-pay | Admitting: Internal Medicine

## 2015-02-18 MED ORDER — ESOMEPRAZOLE MAGNESIUM 40 MG PO CPDR: 40.0000 mg | DELAYED_RELEASE_CAPSULE | Freq: Every day | ORAL | Status: DC

## 2015-02-18 NOTE — Telephone Encounter (Signed)
RX sent

## 2015-02-25 ENCOUNTER — Ambulatory Visit (INDEPENDENT_AMBULATORY_CARE_PROVIDER_SITE_OTHER): Payer: Commercial Managed Care - HMO | Admitting: Internal Medicine

## 2015-02-25 ENCOUNTER — Other Ambulatory Visit (INDEPENDENT_AMBULATORY_CARE_PROVIDER_SITE_OTHER): Payer: Commercial Managed Care - HMO

## 2015-02-25 ENCOUNTER — Encounter: Payer: Self-pay | Admitting: Internal Medicine

## 2015-02-25 VITALS — BP 116/70 | HR 64 | Ht 60.25 in | Wt 206.1 lb

## 2015-02-25 DIAGNOSIS — K648 Other hemorrhoids: Secondary | ICD-10-CM

## 2015-02-25 DIAGNOSIS — D509 Iron deficiency anemia, unspecified: Secondary | ICD-10-CM

## 2015-02-25 LAB — CBC WITH DIFFERENTIAL/PLATELET
BASOS ABS: 0.2 10*3/uL — AB (ref 0.0–0.1)
BASOS PCT: 2.2 % (ref 0.0–3.0)
EOS PCT: 8.3 % — AB (ref 0.0–5.0)
Eosinophils Absolute: 0.7 10*3/uL (ref 0.0–0.7)
HEMATOCRIT: 37.7 % (ref 36.0–46.0)
Hemoglobin: 12.7 g/dL (ref 12.0–15.0)
LYMPHS ABS: 2.6 10*3/uL (ref 0.7–4.0)
Lymphocytes Relative: 32.3 % (ref 12.0–46.0)
MCHC: 33.5 g/dL (ref 30.0–36.0)
MCV: 86.4 fl (ref 78.0–100.0)
MONO ABS: 0.8 10*3/uL (ref 0.1–1.0)
MONOS PCT: 9.7 % (ref 3.0–12.0)
NEUTROS PCT: 47.5 % (ref 43.0–77.0)
Neutro Abs: 3.9 10*3/uL (ref 1.4–7.7)
Platelets: 323 10*3/uL (ref 150.0–400.0)
RBC: 4.36 Mil/uL (ref 3.87–5.11)
RDW: 13.8 % (ref 11.5–15.5)
WBC: 8.2 10*3/uL (ref 4.0–10.5)

## 2015-02-25 LAB — IBC PANEL
IRON: 88 ug/dL (ref 42–145)
Saturation Ratios: 21.2 % (ref 20.0–50.0)
Transferrin: 297 mg/dL (ref 212.0–360.0)

## 2015-02-25 LAB — FERRITIN: Ferritin: 17.4 ng/mL (ref 10.0–291.0)

## 2015-02-25 NOTE — Patient Instructions (Signed)

## 2015-02-26 ENCOUNTER — Other Ambulatory Visit: Payer: Self-pay

## 2015-02-26 DIAGNOSIS — D509 Iron deficiency anemia, unspecified: Secondary | ICD-10-CM

## 2015-02-26 NOTE — Progress Notes (Signed)
Patient ID: [name], female   DOB: May 23, 1946, 69 y.o.   MRN: [id number]  Mrs. Olano returns today to consider repeat banding for symptomatic, bleeding grade 2 internal hemorrhoids History of iron deficiency anemia Tolerated hemorrhoidal banding #1 and #2 very well. Has had much less rectal bleeding and in fact no bleeding since band #2 was placed   PROCEDURE NOTE: The patient presents with symptomatic grade 2 internal hemorrhoids, requesting rubber band ligation of her hemorrhoidal disease.  All risks, benefits and alternative forms of therapy were described and informed consent was obtained.   The anorectum was pre-medicated with 0.125% nitroglycerin ointment The decision was made to band the right posterior internal hemorrhoid (RA #1, LL #2), and the [place] was used to perform band ligation without complication.  Digital anorectal examination was then performed to assure proper positioning of the band, and to adjust the banded tissue as required.  The patient was discharged home without pain or other issues.  Dietary and behavioral recommendations were given and along with follow-up instructions.     The following adjunctive treatments were recommended: --CBC and iron studies today --daily PPI --Balenol recommended for mild perianal itching, no evidence for tinea on exam today   No complications were encountered and the patient tolerated the procedure well.

## 2015-02-27 ENCOUNTER — Ambulatory Visit: Payer: Commercial Managed Care - HMO | Admitting: Family Medicine

## 2015-03-04 ENCOUNTER — Telehealth: Payer: Self-pay | Admitting: Internal Medicine

## 2015-03-04 MED ORDER — PREDNISONE 20 MG PO TABS: 20.0000 mg | ORAL_TABLET | Freq: Every day | ORAL | Status: DC

## 2015-03-04 NOTE — Telephone Encounter (Signed)
If there has been no obvious infection going on this long, it is probably allergy. Suggest prednisone 20 mg, # 5, 1 daily Ok to use Mucinex-DM otc to help with cough and phlegm.

## 2015-03-04 NOTE — Telephone Encounter (Signed)
Called and spoke to pt. Pt c/o sinus pressure, cough with little mucus production light yellow in color, rhinorrhea with clear mucus x 1 day. Pt stated these s/s have been present for 1 month but have recently worsened. Pt taking Zyrtec without relief. Pt denies SOB, CP/tightness, f/c/s. Pt requesting recs.   CY please advise.   Allergies  Allergen Reactions  . Soybean-Containing Drug Products     Allergy testing positive for soy beans but pt uses soy sauce  . Sulfonamide Derivatives     Current Outpatient Prescriptions on File Prior to Visit  Medication Sig Dispense Refill  . atorvastatin (LIPITOR) 20 MG tablet Take 1 tablet (20 mg total) by mouth daily. 90 tablet 3  . azelastine (ASTELIN) 0.1 % nasal spray Place 1-2 sprays into both nostrils at bedtime. 30 mL 5  . cetirizine (ZYRTEC) 10 MG tablet Take 1 tablet (10 mg total) by mouth daily. As needed 90 tablet 3  . Cholecalciferol (VITAMIN D3) 2000 UNITS capsule Take 2,000 Units by mouth daily.    Marland Kitchen diltiazem (TIAZAC) 120 MG 24 hr capsule Take 1 capsule (120 mg total) by mouth daily. 90 capsule 1  . esomeprazole (NEXIUM) 40 MG capsule Take 1 capsule (40 mg total) by mouth daily. 30 capsule 4  . ferrous sulfate 325 (65 FE) MG tablet Take 1 tablet (325 mg total) by mouth daily with breakfast. 30 tablet 2  . losartan-hydrochlorothiazide (HYZAAR) 100-25 MG per tablet Take 1 tablet by mouth daily. 90 tablet 3  . meloxicam (MOBIC) 15 MG tablet Take 1 tablet (15 mg total) by mouth daily. 90 tablet 1  . metoprolol (LOPRESSOR) 50 MG tablet Take 1 tablet (50 mg total) by mouth 2 (two) times daily. 180 tablet 3  . nystatin (MYCOSTATIN) powder Use as directed twice per day to affected area 15 g 0  . PENNSAID 2 % SOLN APPLY  TO AFFECTED AREA TOPICALLY TWICE A DAY 112 g 3  . sertraline (ZOLOFT) 50 MG tablet Take 1 tablet (50 mg total) by mouth daily. 90 tablet 3  . sucralfate (CARAFATE) 1 G tablet Take 1 g by mouth as needed.    . traMADol (ULTRAM) 50  MG tablet 1-2 tabs by mouth every 6 hours as needed for pain 120 tablet 5  . triamcinolone cream (KENALOG) 0.1 % Apply topically 2 (two) times daily. 30 g 0  . Turmeric 500 MG CAPS Take 1 capsule by mouth daily.     No current facility-administered medications on file prior to visit.

## 2015-03-04 NOTE — Telephone Encounter (Signed)
Pt is aware of CY's recs and Rx has been sent. Pt will get Mucinex DM OTC. Nothing more needed at this time.

## 2015-03-15 ENCOUNTER — Other Ambulatory Visit: Payer: Self-pay | Admitting: Internal Medicine

## 2015-03-20 ENCOUNTER — Ambulatory Visit: Payer: Commercial Managed Care - HMO | Admitting: Internal Medicine

## 2015-03-20 ENCOUNTER — Telehealth: Payer: Self-pay | Admitting: Internal Medicine

## 2015-03-20 MED ORDER — AZITHROMYCIN 250 MG PO TABS: ORAL_TABLET | ORAL | Status: AC

## 2015-03-20 NOTE — Telephone Encounter (Signed)
Pt is aware of CY's recs. Rx has been sent in. Nothing further was needed. 

## 2015-03-20 NOTE — Telephone Encounter (Signed)
Offer Zpak, suggest either Delsym or Mucinex-DM, nasal saline rinse.

## 2015-03-20 NOTE — Telephone Encounter (Signed)
Spoke with pt. States that she is having issues with congestion. Reports sinus congestion, sinus headache, dry cough. SOB and chest tightness are also present. Onset was 3 days ago. Offered appointment and she declined.  Allergies  Allergen Reactions  . Soybean-Containing Drug Products     Allergy testing positive for soy beans but pt uses soy sauce  . Sulfonamide Derivatives     Current Outpatient Prescriptions on File Prior to Visit  Medication Sig Dispense Refill  . atorvastatin (LIPITOR) 20 MG tablet Take 1 tablet (20 mg total) by mouth daily. 90 tablet 3  . azelastine (ASTELIN) 0.1 % nasal spray Place 1-2 sprays into both nostrils at bedtime. 30 mL 5  . cetirizine (ZYRTEC) 10 MG tablet Take 1 tablet (10 mg total) by mouth daily. As needed 90 tablet 3  . Cholecalciferol (VITAMIN D3) 2000 UNITS capsule Take 2,000 Units by mouth daily.    Marland Kitchen diltiazem (TIAZAC) 120 MG 24 hr capsule Take 1 capsule (120 mg total) by mouth daily. 90 capsule 1  . esomeprazole (NEXIUM) 40 MG capsule Take 1 capsule (40 mg total) by mouth daily. 30 capsule 4  . ferrous sulfate 325 (65 FE) MG tablet Take 1 tablet (325 mg total) by mouth daily with breakfast. 30 tablet 2  . losartan-hydrochlorothiazide (HYZAAR) 100-25 MG per tablet TAKE 1 TABLET EVERY DAY 90 tablet 0  . meloxicam (MOBIC) 15 MG tablet Take 1 tablet (15 mg total) by mouth daily. 90 tablet 1  . metoprolol (LOPRESSOR) 50 MG tablet TAKE 1 TABLET TWICE DAILY 180 tablet 0  . nystatin (MYCOSTATIN) powder Use as directed twice per day to affected area 15 g 0  . PENNSAID 2 % SOLN APPLY  TO AFFECTED AREA TOPICALLY TWICE A DAY 112 g 3  . predniSONE (DELTASONE) 20 MG tablet Take 1 tablet (20 mg total) by mouth daily. 5 tablet 0  . sertraline (ZOLOFT) 50 MG tablet Take 1 tablet (50 mg total) by mouth daily. 90 tablet 3  . sucralfate (CARAFATE) 1 G tablet Take 1 g by mouth as needed.    . traMADol (ULTRAM) 50 MG tablet 1-2 tabs by mouth every 6 hours as  needed for pain 120 tablet 5  . triamcinolone cream (KENALOG) 0.1 % Apply topically 2 (two) times daily. 30 g 0  . Turmeric 500 MG CAPS Take 1 capsule by mouth daily.     No current facility-administered medications on file prior to visit.    CY - please advise. Thanks.

## 2015-03-26 ENCOUNTER — Ambulatory Visit: Payer: Commercial Managed Care - HMO | Admitting: Family Medicine

## 2015-03-27 ENCOUNTER — Ambulatory Visit (INDEPENDENT_AMBULATORY_CARE_PROVIDER_SITE_OTHER): Payer: Commercial Managed Care - HMO | Admitting: Family Medicine

## 2015-03-27 ENCOUNTER — Encounter: Payer: Self-pay | Admitting: Internal Medicine

## 2015-03-27 ENCOUNTER — Encounter: Payer: Self-pay | Admitting: Family Medicine

## 2015-03-27 ENCOUNTER — Ambulatory Visit (INDEPENDENT_AMBULATORY_CARE_PROVIDER_SITE_OTHER): Payer: Commercial Managed Care - HMO | Admitting: Internal Medicine

## 2015-03-27 VITALS — BP 124/82 | HR 57 | Ht 60.25 in | Wt 208.0 lb

## 2015-03-27 VITALS — BP 124/82 | HR 57 | Temp 97.8°F | Wt 208.0 lb

## 2015-03-27 DIAGNOSIS — J309 Allergic rhinitis, unspecified: Secondary | ICD-10-CM

## 2015-03-27 DIAGNOSIS — M1710 Unilateral primary osteoarthritis, unspecified knee: Secondary | ICD-10-CM

## 2015-03-27 DIAGNOSIS — Z0000 Encounter for general adult medical examination without abnormal findings: Secondary | ICD-10-CM

## 2015-03-27 MED ORDER — TRIAMCINOLONE ACETONIDE 55 MCG/ACT NA AERO: 2.0000 | INHALATION_SPRAY | Freq: Every day | NASAL | Status: DC

## 2015-03-27 MED ORDER — ATORVASTATIN CALCIUM 20 MG PO TABS: 20.0000 mg | ORAL_TABLET | Freq: Every day | ORAL | Status: DC

## 2015-03-27 MED ORDER — DILTIAZEM HCL ER BEADS 120 MG PO CP24: 120.0000 mg | ORAL_CAPSULE | Freq: Every day | ORAL | Status: DC

## 2015-03-27 MED ORDER — SERTRALINE HCL 50 MG PO TABS: 50.0000 mg | ORAL_TABLET | Freq: Every day | ORAL | Status: DC

## 2015-03-27 MED ORDER — LOSARTAN POTASSIUM-HCTZ 100-25 MG PO TABS: 1.0000 | ORAL_TABLET | Freq: Every day | ORAL | Status: DC

## 2015-03-27 MED ORDER — METOPROLOL TARTRATE 50 MG PO TABS: 50.0000 mg | ORAL_TABLET | Freq: Two times a day (BID) | ORAL | Status: DC

## 2015-03-27 NOTE — Patient Instructions (Signed)

## 2015-03-27 NOTE — Progress Notes (Signed)
Pre visit review using our clinic review tool, if applicable. No additional management support is needed unless otherwise documented below in the visit note. 

## 2015-03-27 NOTE — Patient Instructions (Signed)
Good to see you You are doing great Continue what you are doing Stay active Ice is your friend See me when you need me and can repeat injection every 3-4 months or we can do orthovisc.

## 2015-03-27 NOTE — Progress Notes (Signed)
  [name] [place],  [address] Phone: [phone] Subjective:      CC: Bilateral knee pain follow up   [id number] [name] is a 69 y.o. female coming in with complaint of  bilateral knee pain. 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  Patient 3 months ago was given an injection in her knees bilaterally. Patient states that lasted until the last 2 weeks when it started worsening again. Patient describes the pain as more of a dull throbbing aching sensation. Starting to stop her from activities. Starting to give her more discomfort at night as well. Denies any significant instability. Has needed to take her tramadol on a more regular basis.    Past medical history, social, surgical and family history all reviewed in electronic medical record.   Review of Systems: No headache, visual changes, nausea, vomiting, diarrhea, constipation, dizziness, abdominal pain, skin rash, fevers, chills, night sweats, weight loss, swollen lymph nodes, body aches, joint swelling, muscle aches, chest pain, shortness of breath, mood changes.   Objective Blood pressure 124/82, pulse 57, height 5' 0.25" (1.53 m), weight 208 lb (94.348 kg), SpO2 94 %.  General: No apparent distress alert and oriented x3 mood and affect normal, dressed appropriately. Overweight HEENT: Pupils equal, extraocular movements intact  Respiratory: Patient's speak in full sentences and does not appear short of breath  Cardiovascular: No lower extremity edema, non tender, no erythema  Skin: Warm dry intact with no signs of infection or rash on extremities or on axial skeleton.  Abdomen: Soft nontender  Neuro: Cranial nerves II through XII are intact, neurovascularly intact in all extremities with 2+ DTRs and 2+ pulses.  Lymph: No lymphadenopathy of posterior or anterior cervical chain or  axillae bilaterally.  Gait normal with good balance and coordination.  MSK:  Non tender with full range of motion and good stability and symmetric strength and tone of shoulders, elbows, wrist, hips bilaterally.  Knee: Bilateral Normal to inspection with no erythema or effusion or obvious bony abnormalities. Moderate to severe tenderness of the medial joint line bilaterally Mild decrease in range of motion lacking the last 5 of extension and suction Ligaments with solid consistent endpoints including ACL, PCL, LCL, MCL. Negative Mcmurray's, Apley's, and Thessalonian tests. Patellar glide moderate crepitus. Patellar and quadriceps tendons unremarkable. Hamstring and quadriceps strength is normal.   After informed written and verbal consent, patient was seated on exam table. Right knee was prepped with alcohol swab and utilizing anterolateral approach, patient's right knee space was injected with 4:1  marcaine 0.5%: Kenalog 40mg /dL. Patient tolerated the procedure well without immediate complications.  After informed written and verbal consent, patient was seated on exam table. Left knee was prepped with alcohol swab and utilizing anterolateral approach, patient's left knee space was injected with 4:1  marcaine 0.5%: Kenalog 40mg /dL. Patient tolerated the procedure well without immediate complications.    Impression and Recommendations:     This case required medical decision making of moderate complexity.

## 2015-03-27 NOTE — Assessment & Plan Note (Signed)
She was given injections in the knees again bilaterally. Patient did tolerate the procedure very well. We discussed icing regimen, home exercises, and continuing the conservative therapy. Patient declined formal physical therapy. Patient will come back and see me again in 34 weeks and if any worsening symptoms we'll consider viscous supple mentation. Otherwise patient can continue to have injections every 3-4 months as needed.

## 2015-03-27 NOTE — Progress Notes (Signed)
Subjective:    Patient ID: [name], female    DOB: 1946-01-21, 69 y.o.   MRN: [id number]  HPI  Here for wellness and f/u;  Overall doing ok;  Pt denies Chest pain, worsening SOB, DOE, wheezing, orthopnea, PND, worsening LE edema, palpitations, dizziness or syncope.  Pt denies neurological change such as new headache, facial or extremity weakness.  Pt denies polydipsia, polyuria, or low sugar symptoms. Pt states overall good compliance with treatment and medications, good tolerability, and has been trying to follow appropriate diet.  Pt denies worsening depressive symptoms, suicidal ideation or panic. No fever, night sweats, wt loss, loss of appetite, or other constitutional symptoms.  Pt states good ability with ADL's, has low fall risk, home safety reviewed and adequate, no other significant changes in hearing or vision, and only occasionally active with exercise. No current complaints except Does have several wks ongoing nasal allergy symptoms with clearish congestion, itch and sneezing, without fever, pain, ST, cough, swelling or wheezing, sees Dr Annamaria Boots Past Medical History  Diagnosis Date  . HTN (hypertension)   . Hyperlipidemia   . Obesity   . Osteoarthritis   . DJD (degenerative joint disease), lumbar   . Chronic headaches   . GERD (gastroesophageal reflux disease)   . Allergic rhinitis, cause unspecified   . Hiatal hernia   . Hemorrhoids   . Diverticulosis   . Tubular adenoma of colon   . Anemia   . Allergy     SEASONAL   Past Surgical History  Procedure Laterality Date  . Tubal ligation    . Colonoscopy    . Ablation      reports that she has never smoked. She has never used smokeless tobacco. She reports that she does not drink alcohol or use illicit drugs. family history includes Diabetes in her brother; Heart disease in her mother; Hypertension in her mother. There is no history of Colon cancer, Esophageal cancer, Rectal cancer, or Stomach cancer. Allergies    Allergen Reactions  . Soybean-Containing Drug Products     Allergy testing positive for soy beans but pt uses soy sauce  . Sulfonamide Derivatives    Current Outpatient Prescriptions on File Prior to Visit  Medication Sig Dispense Refill  . azelastine (ASTELIN) 0.1 % nasal spray Place 1-2 sprays into both nostrils at bedtime. 30 mL 5  . cetirizine (ZYRTEC) 10 MG tablet Take 1 tablet (10 mg total) by mouth daily. As needed 90 tablet 3  . Cholecalciferol (VITAMIN D3) 2000 UNITS capsule Take 2,000 Units by mouth daily.    Marland Kitchen esomeprazole (NEXIUM) 40 MG capsule Take 1 capsule (40 mg total) by mouth daily. 30 capsule 4  . ferrous sulfate 325 (65 FE) MG tablet Take 1 tablet (325 mg total) by mouth daily with breakfast. 30 tablet 2  . meloxicam (MOBIC) 15 MG tablet Take 1 tablet (15 mg total) by mouth daily. 90 tablet 1  . nystatin (MYCOSTATIN) powder Use as directed twice per day to affected area 15 g 0  . PENNSAID 2 % SOLN APPLY  TO AFFECTED AREA TOPICALLY TWICE A DAY 112 g 3  . predniSONE (DELTASONE) 20 MG tablet Take 1 tablet (20 mg total) by mouth daily. 5 tablet 0  . sucralfate (CARAFATE) 1 G tablet Take 1 g by mouth as needed.    . traMADol (ULTRAM) 50 MG tablet 1-2 tabs by mouth every 6 hours as needed for pain 120 tablet 5  . triamcinolone cream (KENALOG) 0.1 % Apply  topically 2 (two) times daily. 30 g 0  . Turmeric 500 MG CAPS Take 1 capsule by mouth daily.     No current facility-administered medications on file prior to visit.    Review of Systems Constitutional: Negative for increased diaphoresis, other activity, appetite or siginficant weight change other than noted HENT: Negative for worsening hearing loss, ear pain, facial swelling, mouth sores and neck stiffness.   Eyes: Negative for other worsening pain, redness or visual disturbance.  Respiratory: Negative for shortness of breath and wheezing  Cardiovascular: Negative for chest pain and palpitations.  Gastrointestinal:  Negative for diarrhea, blood in stool, abdominal distention or other pain Genitourinary: Negative for hematuria, flank pain or change in urine volume.  Musculoskeletal: Negative for myalgias or other joint complaints.  Skin: Negative for color change and wound or drainage.  Neurological: Negative for syncope and numbness. other than noted Hematological: Negative for adenopathy. or other swelling Psychiatric/Behavioral: Negative for hallucinations, SI, self-injury, decreased concentration or other worsening agitation.      Objective:   Physical Exam BP 124/82 mmHg  Pulse 57  Temp(Src) 97.8 F (36.6 C) (Oral)  Wt 208 lb (94.348 kg)  SpO2 94% VS noted,  Constitutional: Pt is oriented to person, place, and time. Appears well-developed and well-nourished, in no significant distress Head: Normocephalic and atraumatic.  Right Ear: External ear normal.  Left Ear: External ear normal.  Nose: Nose normal.  Mouth/Throat: Oropharynx is clear and moist.  Eyes: Conjunctivae and EOM are normal. Pupils are equal, round, and reactive to light.  Neck: Normal range of motion. Neck supple. No JVD present. No tracheal deviation present or significant neck LA or mass Cardiovascular: Normal rate, regular rhythm, normal heart sounds and intact distal pulses.   Pulmonary/Chest: Effort normal and breath sounds without rales or wheezing  Abdominal: Soft. Bowel sounds are normal. NT. No HSM  Musculoskeletal: Normal range of motion. Exhibits no edema.  Lymphadenopathy:  Has no cervical adenopathy.  Neurological: Pt is alert and oriented to person, place, and time. Pt has normal reflexes. No cranial nerve deficit. Motor grossly intact Skin: Skin is warm and dry. No rash noted.  Psychiatric:  Has normal mood and affect. Behavior is normal.  Wt Readings from Last 3 Encounters:  03/27/15 208 lb (94.348 kg)  03/27/15 208 lb (94.348 kg)  02/25/15 206 lb 2 oz (93.498 kg)       Assessment & Plan:

## 2015-03-30 NOTE — Assessment & Plan Note (Signed)
Ok to re-start zyrtec/astelin,  to f/u any worsening symptoms or concerns

## 2015-03-30 NOTE — Assessment & Plan Note (Signed)

## 2015-04-25 ENCOUNTER — Telehealth: Payer: Self-pay | Admitting: Internal Medicine

## 2015-04-25 DIAGNOSIS — H538 Other visual disturbances: Secondary | ICD-10-CM

## 2015-04-25 NOTE — Telephone Encounter (Signed)
Patient nee a referral to see a eye doctor, please advise

## 2015-04-25 NOTE — Telephone Encounter (Signed)
Referral done

## 2015-05-09 ENCOUNTER — Telehealth: Payer: Self-pay | Admitting: Internal Medicine

## 2015-05-09 NOTE — Telephone Encounter (Signed)
Patient notified per lab orders on 02/25/15 continue iron for an additional 3 months.  She is advised that on 05/28/15 she can stop her iron.  She will call back for any additional questions or concerns

## 2015-05-17 ENCOUNTER — Telehealth: Payer: Self-pay | Admitting: Internal Medicine

## 2015-05-17 MED ORDER — AZELASTINE HCL 0.1 % NA SOLN: 1.0000 | Freq: Every day | NASAL | Status: DC

## 2015-05-17 MED ORDER — TRIAMCINOLONE ACETONIDE 55 MCG/ACT NA AERO: 2.0000 | INHALATION_SPRAY | Freq: Every day | NASAL | Status: DC

## 2015-05-17 NOTE — Telephone Encounter (Signed)
Ok to refill astelin and nasacort nasal sprays as before  Ok to continue zyrtec  Can add Sudafed from pharmacy counter to take at breakfast and lunch for a few days to help with the nasal congestion. If these don't help enough, then call us back next week.

## 2015-05-17 NOTE — Telephone Encounter (Signed)
Pt is aware of recs. RX for nasal sprays sent to the pharm. Nothing further needed

## 2015-05-17 NOTE — Telephone Encounter (Signed)
Spoke with the pt  She states she has been sneezing and "head stopped up" for the past 2 days  She is not having any cough, wheezing, SOB, fever, or other symptoms  She is taking zyrtec and has astelin and nasacort but ran out of one of them and needs to get this refilled  CDY, please advise if you rec anything further   Allergies  Allergen Reactions  . Soybean-Containing Drug Products     Allergy testing positive for soy beans but pt uses soy sauce  . Sulfonamide Derivatives    Current Outpatient Prescriptions on File Prior to Visit  Medication Sig Dispense Refill  . atorvastatin (LIPITOR) 20 MG tablet Take 1 tablet (20 mg total) by mouth daily. 90 tablet 2  . azelastine (ASTELIN) 0.1 % nasal spray Place 1-2 sprays into both nostrils at bedtime. 30 mL 5  . cetirizine (ZYRTEC) 10 MG tablet Take 1 tablet (10 mg total) by mouth daily. As needed 90 tablet 3  . Cholecalciferol (VITAMIN D3) 2000 UNITS capsule Take 2,000 Units by mouth daily.    Marland Kitchen diltiazem (TIAZAC) 120 MG 24 hr capsule Take 1 capsule (120 mg total) by mouth daily. 90 capsule 2  . esomeprazole (NEXIUM) 40 MG capsule Take 1 capsule (40 mg total) by mouth daily. 30 capsule 4  . ferrous sulfate 325 (65 FE) MG tablet Take 1 tablet (325 mg total) by mouth daily with breakfast. 30 tablet 2  . losartan-hydrochlorothiazide (HYZAAR) 100-25 MG per tablet Take 1 tablet by mouth daily. 90 tablet 2  . meloxicam (MOBIC) 15 MG tablet Take 1 tablet (15 mg total) by mouth daily. 90 tablet 1  . metoprolol (LOPRESSOR) 50 MG tablet Take 1 tablet (50 mg total) by mouth 2 (two) times daily. 180 tablet 2  . nystatin (MYCOSTATIN) powder Use as directed twice per day to affected area 15 g 0  . PENNSAID 2 % SOLN APPLY  TO AFFECTED AREA TOPICALLY TWICE A DAY 112 g 3  . predniSONE (DELTASONE) 20 MG tablet Take 1 tablet (20 mg total) by mouth daily. 5 tablet 0  . sertraline (ZOLOFT) 50 MG tablet Take 1 tablet (50 mg total) by mouth daily. 90 tablet 2  .  sucralfate (CARAFATE) 1 G tablet Take 1 g by mouth as needed.    . traMADol (ULTRAM) 50 MG tablet 1-2 tabs by mouth every 6 hours as needed for pain 120 tablet 5  . triamcinolone (NASACORT AQ) 55 MCG/ACT AERO nasal inhaler Place 2 sprays into the nose daily. 1 Inhaler 12  . triamcinolone cream (KENALOG) 0.1 % Apply topically 2 (two) times daily. 30 g 0  . Turmeric 500 MG CAPS Take 1 capsule by mouth daily.     No current facility-administered medications on file prior to visit.

## 2015-05-22 DIAGNOSIS — H2513 Age-related nuclear cataract, bilateral: Secondary | ICD-10-CM | POA: Diagnosis not present

## 2015-05-28 ENCOUNTER — Telehealth: Payer: Self-pay

## 2015-05-28 NOTE — Telephone Encounter (Signed)
Pt aware.

## 2015-05-28 NOTE — Telephone Encounter (Signed)
-----   Message from Algernon Huxley, RN sent at 02/26/2015  1:33 PM EDT ----- Regarding: Labs Pt needs labs in 3 mth, order in epic.

## 2015-05-29 ENCOUNTER — Telehealth: Payer: Self-pay | Admitting: Internal Medicine

## 2015-05-29 NOTE — Telephone Encounter (Signed)
Opened in error

## 2015-05-30 ENCOUNTER — Telehealth: Payer: Self-pay | Admitting: Internal Medicine

## 2015-05-30 MED ORDER — AMOXICILLIN-POT CLAVULANATE 875-125 MG PO TABS: 1.0000 | ORAL_TABLET | Freq: Two times a day (BID) | ORAL | Status: DC

## 2015-05-30 NOTE — Telephone Encounter (Signed)
ATC PT x 2 and not able to hear patient. WCB

## 2015-05-30 NOTE — Telephone Encounter (Signed)
Please offer augmentin 875, # 14, 1 twice daily

## 2015-05-30 NOTE — Telephone Encounter (Signed)
Called and spoke to pt. Informed her of the recs per CY. Rx sent to preferred pharmacy. Pt verbalized understanding and denied any further questions or concerns at this time.   

## 2015-05-30 NOTE — Telephone Encounter (Signed)
Pt called back. She c/o HA on left side, facial pressure, PND, nasal cong, prod cough (white phlem), blowing out yellow/white phlem out of nose. She has took sudafed, used her nasal sprays. She wants something called in. Please advise Dr. Annamaria Boots thanks  Allergies  Allergen Reactions  . Soybean-Containing Drug Products     Allergy testing positive for soy beans but pt uses soy sauce  . Sulfonamide Derivatives      Current Outpatient Prescriptions on File Prior to Visit  Medication Sig Dispense Refill  . atorvastatin (LIPITOR) 20 MG tablet Take 1 tablet (20 mg total) by mouth daily. 90 tablet 2  . azelastine (ASTELIN) 0.1 % nasal spray Place 1-2 sprays into both nostrils at bedtime. 30 mL 5  . cetirizine (ZYRTEC) 10 MG tablet Take 1 tablet (10 mg total) by mouth daily. As needed 90 tablet 3  . Cholecalciferol (VITAMIN D3) 2000 UNITS capsule Take 2,000 Units by mouth daily.    Marland Kitchen diltiazem (TIAZAC) 120 MG 24 hr capsule Take 1 capsule (120 mg total) by mouth daily. 90 capsule 2  . esomeprazole (NEXIUM) 40 MG capsule Take 1 capsule (40 mg total) by mouth daily. 30 capsule 4  . ferrous sulfate 325 (65 FE) MG tablet Take 1 tablet (325 mg total) by mouth daily with breakfast. 30 tablet 2  . losartan-hydrochlorothiazide (HYZAAR) 100-25 MG per tablet Take 1 tablet by mouth daily. 90 tablet 2  . meloxicam (MOBIC) 15 MG tablet Take 1 tablet (15 mg total) by mouth daily. 90 tablet 1  . metoprolol (LOPRESSOR) 50 MG tablet Take 1 tablet (50 mg total) by mouth 2 (two) times daily. 180 tablet 2  . nystatin (MYCOSTATIN) powder Use as directed twice per day to affected area 15 g 0  . PENNSAID 2 % SOLN APPLY  TO AFFECTED AREA TOPICALLY TWICE A DAY 112 g 3  . predniSONE (DELTASONE) 20 MG tablet Take 1 tablet (20 mg total) by mouth daily. 5 tablet 0  . sertraline (ZOLOFT) 50 MG tablet Take 1 tablet (50 mg total) by mouth daily. 90 tablet 2  . sucralfate (CARAFATE) 1 G tablet Take 1 g by mouth as needed.    .  traMADol (ULTRAM) 50 MG tablet 1-2 tabs by mouth every 6 hours as needed for pain 120 tablet 5  . triamcinolone (NASACORT AQ) 55 MCG/ACT AERO nasal inhaler Place 2 sprays into the nose daily. 1 Inhaler 12  . triamcinolone cream (KENALOG) 0.1 % Apply topically 2 (two) times daily. 30 g 0  . Turmeric 500 MG CAPS Take 1 capsule by mouth daily.     No current facility-administered medications on file prior to visit.

## 2015-05-31 ENCOUNTER — Other Ambulatory Visit (INDEPENDENT_AMBULATORY_CARE_PROVIDER_SITE_OTHER): Payer: Commercial Managed Care - HMO

## 2015-05-31 DIAGNOSIS — D509 Iron deficiency anemia, unspecified: Secondary | ICD-10-CM | POA: Diagnosis not present

## 2015-05-31 LAB — CBC WITH DIFFERENTIAL/PLATELET
Basophils Absolute: 0.1 10*3/uL (ref 0.0–0.1)
Basophils Relative: 0.7 % (ref 0.0–3.0)
EOS ABS: 0.3 10*3/uL (ref 0.0–0.7)
Eosinophils Relative: 4.2 % (ref 0.0–5.0)
HEMATOCRIT: 40.3 % (ref 36.0–46.0)
HEMOGLOBIN: 13.4 g/dL (ref 12.0–15.0)
Lymphocytes Relative: 27.1 % (ref 12.0–46.0)
Lymphs Abs: 2.2 10*3/uL (ref 0.7–4.0)
MCHC: 33.2 g/dL (ref 30.0–36.0)
MCV: 88.4 fl (ref 78.0–100.0)
MONOS PCT: 8.9 % (ref 3.0–12.0)
Monocytes Absolute: 0.7 10*3/uL (ref 0.1–1.0)
NEUTROS ABS: 4.9 10*3/uL (ref 1.4–7.7)
NEUTROS PCT: 59.1 % (ref 43.0–77.0)
Platelets: 326 10*3/uL (ref 150.0–400.0)
RBC: 4.55 Mil/uL (ref 3.87–5.11)
RDW: 13.8 % (ref 11.5–15.5)
WBC: 8.3 10*3/uL (ref 4.0–10.5)

## 2015-05-31 LAB — IBC PANEL
Iron: 63 ug/dL (ref 42–145)
Saturation Ratios: 15.3 % — ABNORMAL LOW (ref 20.0–50.0)
Transferrin: 294 mg/dL (ref 212.0–360.0)

## 2015-05-31 LAB — FERRITIN: FERRITIN: 21.5 ng/mL (ref 10.0–291.0)

## 2015-06-03 ENCOUNTER — Other Ambulatory Visit: Payer: Self-pay

## 2015-06-03 DIAGNOSIS — D509 Iron deficiency anemia, unspecified: Secondary | ICD-10-CM

## 2015-06-04 ENCOUNTER — Telehealth: Payer: Self-pay | Admitting: Internal Medicine

## 2015-06-04 NOTE — Telephone Encounter (Signed)
Pt informed of recs per CY. Nothing further needed.

## 2015-06-04 NOTE — Telephone Encounter (Signed)
Pt calling for sooner appt than scheduled - 07/24/15 with CY - Pt offered appt for 06/05/15 and 06/21/15, refused both stating that she needs something in the afternoon d/t recently starting a new job.  Pt c/o headache that comes and goes, pain is on one side of her head - feels like congestion/sinus pressure?? Pt states that mucus is clear.  Using Astelin Nasal Spray and Augmentin antibiotic given 05/30/15 - not helping.  Please advise of rec's or if can be scheduled with another provider. Thanks.  CY-please advise.    Allergies  Allergen Reactions  . Soybean-Containing Drug Products     Allergy testing positive for soy beans but pt uses soy sauce  . Sulfonamide Derivatives      Medication List       This list is accurate as of: 06/04/15  2:57 PM.  Always use your most recent med list.               amoxicillin-clavulanate 875-125 MG per tablet  Commonly known as:  AUGMENTIN  Take 1 tablet by mouth 2 (two) times daily.     atorvastatin 20 MG tablet  Commonly known as:  LIPITOR  Take 1 tablet (20 mg total) by mouth daily.     azelastine 0.1 % nasal spray  Commonly known as:  ASTELIN  Place 1-2 sprays into both nostrils at bedtime.     cetirizine 10 MG tablet  Commonly known as:  ZYRTEC  Take 1 tablet (10 mg total) by mouth daily. As needed     diltiazem 120 MG 24 hr capsule  Commonly known as:  TIAZAC  Take 1 capsule (120 mg total) by mouth daily.     esomeprazole 40 MG capsule  Commonly known as:  NEXIUM  Take 1 capsule (40 mg total) by mouth daily.     ferrous sulfate 325 (65 FE) MG tablet  Take 1 tablet (325 mg total) by mouth daily with breakfast.     losartan-hydrochlorothiazide 100-25 MG per tablet  Commonly known as:  HYZAAR  Take 1 tablet by mouth daily.     meloxicam 15 MG tablet  Commonly known as:  MOBIC  Take 1 tablet (15 mg total) by mouth daily.     metoprolol 50 MG tablet  Commonly known as:  LOPRESSOR  Take 1 tablet (50 mg total) by mouth 2 (two)  times daily.     nystatin powder  Commonly known as:  MYCOSTATIN  Use as directed twice per day to affected area     PENNSAID 2 % Soln  Generic drug:  Diclofenac Sodium  APPLY  TO AFFECTED AREA TOPICALLY TWICE A DAY     predniSONE 20 MG tablet  Commonly known as:  DELTASONE  Take 1 tablet (20 mg total) by mouth daily.     sertraline 50 MG tablet  Commonly known as:  ZOLOFT  Take 1 tablet (50 mg total) by mouth daily.     sucralfate 1 G tablet  Commonly known as:  CARAFATE  Take 1 g by mouth as needed.     traMADol 50 MG tablet  Commonly known as:  ULTRAM  1-2 tabs by mouth every 6 hours as needed for pain     triamcinolone 55 MCG/ACT Aero nasal inhaler  Commonly known as:  NASACORT AQ  Place 2 sprays into the nose daily.     triamcinolone cream 0.1 %  Commonly known as:  KENALOG  Apply topically 2 (two) times daily.  Turmeric 500 MG Caps  Take 1 capsule by mouth daily.     Vitamin D3 2000 UNITS capsule  Take 2,000 Units by mouth daily.

## 2015-06-04 NOTE — Telephone Encounter (Signed)
This seems unlikely to be a sinus infection. Suggest she try to see her PCP to sort out what is going on.

## 2015-06-05 ENCOUNTER — Ambulatory Visit (INDEPENDENT_AMBULATORY_CARE_PROVIDER_SITE_OTHER): Payer: Commercial Managed Care - HMO | Admitting: Internal Medicine

## 2015-06-05 ENCOUNTER — Encounter: Payer: Self-pay | Admitting: Internal Medicine

## 2015-06-05 VITALS — BP 120/72 | HR 58 | Temp 98.4°F

## 2015-06-05 DIAGNOSIS — J019 Acute sinusitis, unspecified: Secondary | ICD-10-CM

## 2015-06-05 DIAGNOSIS — J309 Allergic rhinitis, unspecified: Secondary | ICD-10-CM

## 2015-06-05 DIAGNOSIS — I10 Essential (primary) hypertension: Secondary | ICD-10-CM

## 2015-06-05 MED ORDER — LEVOFLOXACIN 250 MG PO TABS: 250.0000 mg | ORAL_TABLET | Freq: Every day | ORAL | Status: DC

## 2015-06-05 NOTE — Progress Notes (Signed)
Subjective:    Patient ID: [name], female    DOB: [birth year], 70 y.o.   MRN: [id number]  HPI   Here with 2-3 days acute onset fever, facial pain, pressure, headache, general weakness and malaise, and greenish d/c, with mild ST and cough, but pt denies chest pain, wheezing, increased sob or doe, orthopnea, PND, increased LE swelling, palpitations, dizziness or syncope  Denies worsening depressive symptoms, suicidal ideation, or panic; has ongoing anxiety, not increased recently.   Does have several wks ongoing nasal allergy symptoms with clearish congestion, itch and sneezing, without fever, pain, ST, cough, swelling or wheezing.  Pt denies new neurological symptoms such as new headache, or facial or extremity weakness or numbness   Pt denies polydipsia, polyuria. Past Medical History  Diagnosis Date  . HTN (hypertension)   . Hyperlipidemia   . Obesity   . Osteoarthritis   . DJD (degenerative joint disease), lumbar   . Chronic headaches   . GERD (gastroesophageal reflux disease)   . Allergic rhinitis, cause unspecified   . Hiatal hernia   . Hemorrhoids   . Diverticulosis   . Tubular adenoma of colon   . Anemia   . Allergy     SEASONAL   Past Surgical History  Procedure Laterality Date  . Tubal ligation    . Colonoscopy    . Ablation      reports that she has never smoked. She has never used smokeless tobacco. She reports that she does not drink alcohol or use illicit drugs. family history includes Diabetes in her brother; Heart disease in her mother; Hypertension in her mother. There is no history of Colon cancer, Esophageal cancer, Rectal cancer, or Stomach cancer. Allergies  Allergen Reactions  . Soybean-Containing Drug Products     Allergy testing positive for soy beans but pt uses soy sauce  . Sulfonamide Derivatives    Current Outpatient Prescriptions on File Prior to Visit  Medication Sig Dispense Refill  . atorvastatin (LIPITOR) 20 MG tablet Take 1 tablet (20 mg  total) by mouth daily. 90 tablet 2  . azelastine (ASTELIN) 0.1 % nasal spray Place 1-2 sprays into both nostrils at bedtime. 30 mL 5  . cetirizine (ZYRTEC) 10 MG tablet Take 1 tablet (10 mg total) by mouth daily. As needed 90 tablet 3  . Cholecalciferol (VITAMIN D3) 2000 UNITS capsule Take 2,000 Units by mouth daily.    Marland Kitchen diltiazem (TIAZAC) 120 MG 24 hr capsule Take 1 capsule (120 mg total) by mouth daily. 90 capsule 2  . esomeprazole (NEXIUM) 40 MG capsule Take 1 capsule (40 mg total) by mouth daily. 30 capsule 4  . ferrous sulfate 325 (65 FE) MG tablet Take 1 tablet (325 mg total) by mouth daily with breakfast. 30 tablet 2  . losartan-hydrochlorothiazide (HYZAAR) 100-25 MG per tablet Take 1 tablet by mouth daily. 90 tablet 2  . meloxicam (MOBIC) 15 MG tablet Take 1 tablet (15 mg total) by mouth daily. 90 tablet 1  . metoprolol (LOPRESSOR) 50 MG tablet Take 1 tablet (50 mg total) by mouth 2 (two) times daily. 180 tablet 2  . PENNSAID 2 % SOLN APPLY  TO AFFECTED AREA TOPICALLY TWICE A DAY 112 g 3  . sertraline (ZOLOFT) 50 MG tablet Take 1 tablet (50 mg total) by mouth daily. 90 tablet 2  . sucralfate (CARAFATE) 1 G tablet Take 1 g by mouth as needed.    . traMADol (ULTRAM) 50 MG tablet 1-2 tabs by mouth every 6  hours as needed for pain 120 tablet 5  . triamcinolone (NASACORT AQ) 55 MCG/ACT AERO nasal inhaler Place 2 sprays into the nose daily. 1 Inhaler 12  . triamcinolone cream (KENALOG) 0.1 % Apply topically 2 (two) times daily. 30 g 0  . Turmeric 500 MG CAPS Take 1 capsule by mouth daily.    Marland Kitchen nystatin (MYCOSTATIN) powder Use as directed twice per day to affected area 15 g 0   No current facility-administered medications on file prior to visit.   Review of Systems  Constitutional: Negative for unusual diaphoresis or night sweats HENT: Negative for ringing in ear or discharge Eyes: Negative for double vision or worsening visual disturbance.  Respiratory: Negative for choking and stridor.     Gastrointestinal: Negative for vomiting or other signifcant bowel change Genitourinary: Negative for hematuria or change in urine volume.  Musculoskeletal: Negative for other MSK pain or swelling Skin: Negative for color change and worsening wound.  Neurological: Negative for tremors and numbness other than noted  Psychiatric/Behavioral: Negative for decreased concentration or agitation other than above       Objective:   Physical Exam BP 120/72 mmHg  Pulse 58  Temp(Src) 98.4 F (36.9 C) (Oral)  SpO2 98% VS noted, mild ill Constitutional: Pt appears in no significant distress HENT: Head: NCAT.  Right Ear: External ear normal.  Left Ear: External ear normal.  Bilat tm's with mild erythema.  Max sinus areas mod tender.  Pharynx with mild erythema, no exudate Eyes: . Pupils are equal, round, and reactive to light. Conjunctivae and EOM are normal Neck: Normal range of motion. Neck supple.  Cardiovascular: Normal rate and regular rhythm.   Pulmonary/Chest: Effort normal and breath sounds without rales or wheezing.  Neurological: Pt is alert. Not confused , motor grossly intact Skin: Skin is warm. No rash, no LE edema Psychiatric: Pt behavior is normal. No agitation.     Assessment & Plan:

## 2015-06-05 NOTE — Progress Notes (Signed)
Pre visit review using our clinic review tool, if applicable. No additional management support is needed unless otherwise documented below in the visit note. 

## 2015-06-05 NOTE — Patient Instructions (Signed)
Please take all new medication as prescribed - the antibiotic (levaquin)  OK to stop the augmentin  You can also take Delsym OTC for cough, and/or Mucinex (or it's generic off brand) for congestion, and tylenol as needed for pain.  Please continue all other medications as before, and refills have been done if requested.  Please have the pharmacy call with any other refills you may need.  Please keep your appointments with your specialists as you may have planned

## 2015-06-06 DIAGNOSIS — J320 Chronic maxillary sinusitis: Secondary | ICD-10-CM | POA: Insufficient documentation

## 2015-06-06 NOTE — Assessment & Plan Note (Signed)
Mild to mod, for re-start naacort asd,  to f/u any worsening symptoms or concerns

## 2015-06-06 NOTE — Assessment & Plan Note (Signed)
Mild to mod, for antibx course,  to f/u any worsening symptoms or concerns 

## 2015-06-06 NOTE — Assessment & Plan Note (Signed)
stable overall by history and exam, recent data reviewed with pt, and pt to continue medical treatment as before,  to f/u any worsening symptoms or concerns BP Readings from Last 3 Encounters:  06/05/15 120/72  03/27/15 124/82  03/27/15 124/82

## 2015-06-18 ENCOUNTER — Telehealth: Payer: Self-pay | Admitting: Internal Medicine

## 2015-06-18 NOTE — Telephone Encounter (Signed)
Called and spoke to pt. Pt c/o sinus congestion and sinus pressure, same s/s that were mentioned in phone note from 8.16.2016. Advised pt that Dr. Annamaria Boots suggested she speak with her PCP about these issues. Pt stated she did call Dr. Jenny Reichmann 2 weeks ago and they prescribed an abx. Advised pt she give Dr. Gwynn Burly office a call back if she has not improved. Pt verbalized understanding and denied any further questions or concerns at this time.

## 2015-07-02 ENCOUNTER — Encounter: Payer: Self-pay | Admitting: Family Medicine

## 2015-07-02 ENCOUNTER — Ambulatory Visit (INDEPENDENT_AMBULATORY_CARE_PROVIDER_SITE_OTHER): Payer: Commercial Managed Care - HMO | Admitting: Family Medicine

## 2015-07-02 VITALS — BP 122/84 | HR 60 | Ht 60.25 in | Wt 205.0 lb

## 2015-07-02 DIAGNOSIS — M1710 Unilateral primary osteoarthritis, unspecified knee: Secondary | ICD-10-CM

## 2015-07-02 NOTE — Patient Instructions (Signed)
Good to see you [name] is your friend.  Continue to stay active New exercises for your back after work.  See me again in 3-4 weeks if knees hurt will consider custom braces and other injections.

## 2015-07-02 NOTE — Progress Notes (Signed)
Pre visit review using our clinic review tool, if applicable. No additional management support is needed unless otherwise documented below in the visit note. 

## 2015-07-02 NOTE — Progress Notes (Signed)
  [name] [address] [place], [place] [address] Phone: [phone] Subjective:      CC: Bilateral knee pain follow up   [id number] [name] is a 69 y.o. female coming in with complaint of  bilateral knee pain. 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  Patient 3 months ago was given an injection in her knees bilaterally. Patient has been doing very well but states some mild instability recently over the course last 2 weeks. Patient continues to work 4 hours daily. Patient states that this is started affecting her job. Starting to give her more pain. States that even uncomfortable at night. Denies fevers chills or any abnormal weight loss.    Past medical history, social, surgical and family history all reviewed in electronic medical record.   Review of Systems: No headache, visual changes, nausea, vomiting, diarrhea, constipation, dizziness, abdominal pain, skin rash, fevers, chills, night sweats, weight loss, swollen lymph nodes, body aches, joint swelling, muscle aches, chest pain, shortness of breath, mood changes.   Objective Blood pressure 122/84, pulse 60, height 5' 0.25" (1.53 m), weight 205 lb (92.987 kg), SpO2 94 %.  General: No apparent distress alert and oriented x3 mood and affect normal, dressed appropriately. Overweight HEENT: Pupils equal, extraocular movements intact  Respiratory: Patient's speak in full sentences and does not appear short of breath  Cardiovascular: No lower extremity edema, non tender, no erythema  Skin: Warm dry intact with no signs of infection or rash on extremities or on axial skeleton.  Abdomen: Soft nontender  Neuro: Cranial nerves II through XII are intact, neurovascularly intact in all extremities with 2+ DTRs and 2+ pulses.  Lymph: No lymphadenopathy of posterior or anterior cervical chain or axillae bilaterally.   Gait normal with good balance and coordination.  MSK:  Non tender with full range of motion and good stability and symmetric strength and tone of shoulders, elbows, wrist, hips bilaterally.  Knee: Bilateral Valgus deformity of the knees noted Moderate to severe tenderness of the medial joint line bilaterally Continue mild limitation in range of motion lacking the last 5 in flexion and extension Ligaments with solid consistent endpoints including ACL, PCL, LCL, MCL. She  does have some instability with valgus stress Negative Mcmurray's, Apley's, and Thessalonian tests. Patellar glide moderate crepitus. Patellar and quadriceps tendons unremarkable. Hamstring and quadriceps strength is normal.   After informed written and verbal consent, patient was seated on exam table. Right knee was prepped with alcohol swab and utilizing anterolateral approach, patient's right knee space was injected with 4:1  marcaine 0.5%: Kenalog 40mg /dL. Patient tolerated the procedure well without immediate complications.  After informed written and verbal consent, patient was seated on exam table. Left knee was prepped with alcohol swab and utilizing anterolateral approach, patient's left knee space was injected with 4:1  marcaine 0.5%: Kenalog 40mg /dL. Patient tolerated the procedure well without immediate complications.    Impression and Recommendations:     This case required medical decision making of moderate complexity.

## 2015-07-02 NOTE — Assessment & Plan Note (Signed)
Patient did have bilateral injections today. Tolerated the procedure well. Encourage her to do the over-the-counter natural supplementations which is not doing on a regular basis. We discussed icing regimen. Patient declined topical anti-inflammatory. Patient declined custom brace is fitting that this will be beneficial in the future secondary to her instability. Patient would be a candidate for viscous supplementation and we will discuss again in follow-up in 3-4 weeks.  Spent  25 minutes with patient face-to-face and had greater than 50% of counseling including as described above in assessment and plan.

## 2015-07-03 ENCOUNTER — Telehealth: Payer: Self-pay | Admitting: Family Medicine

## 2015-07-03 MED ORDER — MELOXICAM 15 MG PO TABS: 15.0000 mg | ORAL_TABLET | Freq: Every day | ORAL | Status: DC

## 2015-07-03 MED ORDER — TRAMADOL HCL 50 MG PO TABS: ORAL_TABLET | ORAL | Status: DC

## 2015-07-03 NOTE — Telephone Encounter (Signed)
Patient states that she was told at her appt that she could get a fill of pain meds if she wished. She does not rememebr that name of the medication, but is asking for a refill. Pharmacy is [facility] on [address]

## 2015-07-03 NOTE — Telephone Encounter (Signed)
Reordered both meloxicam and tramadol for patient.

## 2015-07-12 ENCOUNTER — Telehealth: Payer: Self-pay | Admitting: Internal Medicine

## 2015-07-12 NOTE — Telephone Encounter (Signed)
Pt advised.

## 2015-07-12 NOTE — Telephone Encounter (Signed)
I would hold off for now, as a newer and better shingles shot is supposed to be here in the next 1-2 yrs, and the one now does not work that well

## 2015-07-12 NOTE — Telephone Encounter (Signed)
Pt called asking to see Dr. Jenny Reichmann recommend her getting the shingle injection? Please advise

## 2015-07-15 ENCOUNTER — Other Ambulatory Visit: Payer: Self-pay | Admitting: *Deleted

## 2015-07-15 MED ORDER — ESOMEPRAZOLE MAGNESIUM 40 MG PO CPDR: 40.0000 mg | DELAYED_RELEASE_CAPSULE | Freq: Every day | ORAL | Status: DC

## 2015-07-24 ENCOUNTER — Ambulatory Visit: Payer: Self-pay | Admitting: Internal Medicine

## 2015-07-26 ENCOUNTER — Ambulatory Visit (INDEPENDENT_AMBULATORY_CARE_PROVIDER_SITE_OTHER): Payer: Commercial Managed Care - HMO

## 2015-07-26 ENCOUNTER — Ambulatory Visit: Payer: Commercial Managed Care - HMO | Admitting: Family Medicine

## 2015-07-26 DIAGNOSIS — Z23 Encounter for immunization: Secondary | ICD-10-CM | POA: Diagnosis not present

## 2015-07-29 ENCOUNTER — Ambulatory Visit: Payer: Commercial Managed Care - HMO | Admitting: Family Medicine

## 2015-07-29 DIAGNOSIS — Z0289 Encounter for other administrative examinations: Secondary | ICD-10-CM

## 2015-08-12 DIAGNOSIS — H01025 Squamous blepharitis left lower eyelid: Secondary | ICD-10-CM | POA: Diagnosis not present

## 2015-08-12 DIAGNOSIS — H10413 Chronic giant papillary conjunctivitis, bilateral: Secondary | ICD-10-CM | POA: Diagnosis not present

## 2015-08-12 DIAGNOSIS — H01024 Squamous blepharitis left upper eyelid: Secondary | ICD-10-CM | POA: Diagnosis not present

## 2015-08-12 DIAGNOSIS — H2513 Age-related nuclear cataract, bilateral: Secondary | ICD-10-CM | POA: Diagnosis not present

## 2015-08-12 DIAGNOSIS — H01022 Squamous blepharitis right lower eyelid: Secondary | ICD-10-CM | POA: Diagnosis not present

## 2015-08-12 DIAGNOSIS — H01021 Squamous blepharitis right upper eyelid: Secondary | ICD-10-CM | POA: Diagnosis not present

## 2015-09-17 ENCOUNTER — Ambulatory Visit: Payer: Commercial Managed Care - HMO | Admitting: Internal Medicine

## 2015-10-01 ENCOUNTER — Other Ambulatory Visit: Payer: Self-pay

## 2015-10-01 DIAGNOSIS — Z1231 Encounter for screening mammogram for malignant neoplasm of breast: Secondary | ICD-10-CM

## 2015-10-17 ENCOUNTER — Telehealth: Payer: Self-pay | Admitting: Internal Medicine

## 2015-10-17 ENCOUNTER — Encounter: Payer: Self-pay | Admitting: Internal Medicine

## 2015-10-17 ENCOUNTER — Ambulatory Visit (INDEPENDENT_AMBULATORY_CARE_PROVIDER_SITE_OTHER): Payer: Commercial Managed Care - HMO | Admitting: Internal Medicine

## 2015-10-17 VITALS — BP 116/62 | HR 57 | Ht 63.0 in | Wt 207.2 lb

## 2015-10-17 DIAGNOSIS — J320 Chronic maxillary sinusitis: Secondary | ICD-10-CM | POA: Diagnosis not present

## 2015-10-17 MED ORDER — CEFDINIR 300 MG PO CAPS: 300.0000 mg | ORAL_CAPSULE | Freq: Two times a day (BID) | ORAL | Status: DC

## 2015-10-17 NOTE — Telephone Encounter (Signed)
Patient is having issues with her knees.  The earliest appt I could find was the week after next. Patient does not want to wait that long.  If there is a cancellation can you please call patient.

## 2015-10-17 NOTE — Telephone Encounter (Signed)
Spoke to pt, offered her an appt tomorrow morning at 8am. Pt declined & stated she will call back to make a different appt.

## 2015-10-17 NOTE — Progress Notes (Signed)
12/1/ 24- 3 yoF never smoker referred courtesy of Dr. Jenny Reichmann for allergy evaluation. She questions allergic sinusitis over the past 6 months for pressure in her ears and retro-orbital area. No postnasal drip. Some cough with tussive substernal soreness, scant clear sputum and occasional wheeze that never diagnosed with asthma. History of seasonal allergic rhinitis. 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 No skin rash. No problems with latex or aspirin. ENT surgery limited to tonsils. Environment: House with crawlspace, gas heat, outdoor dog. No smokers. Says "whole family" has  allergy problems, unspecified.  10/31/13- 40 yoF never smoker referred courtesy of Dr. Jenny Reichmann for allergy evaluation. FOLLOWS FOR: has had stopped up head, headaches, and pressure in Left ear still. Cough as well-non productive. Occasional unpleasant smell. Left ear stopped up since Christmas, pops occasionally but does not hurt. Hearing okay. Sudafed causes palpitation, uses Flonase. Allergy Profile 09/18/2013-total IgE 48. Broadly positive for common allergens including aspergillus. Food profile positive for peanut, soybean. She reports banana and melons make her throat itch.  01/12/14- 67 yoF never smoker followed for allergic rhinitis, recurrent sinusitis, Food allergy. FOLLOWS FOR: sinus congestion, mucous tinged with blood.  especially bad the past couple of days.  Blames sinus infection-eyes are watering, and mild epistaxis, retro-orbital pressure. Using Flonase  CT maxfac 11/03/13 IMPRESSION:  Mild chronic left maxillary sinus disease, otherwise negative.  Electronically Signed  By: Rolla Flatten M.D.  On: 11/03/2013 13:41   09/17/14-  79 yoF never smoker followed for allergic rhinitis, recurrent sinusitis, Food allergy. FOLLOWS FOR: Still has slight stopped up feelings in left ear. No sense of smell as well.  10/17/2015-69 year old female never smoker followed for allergic rhinitis,  recurrent sinusitis, food allergy Pt c/o HA, nasal cong, PND, prod cough (white phlem), slight wheezing/chest tx at times.  Current symptoms persisted over several months. We discussed previous CT indicating chronic left maxillary sinus disease. Neti pot with saline rinse helps if she will use it.  ROS-see HPI Constitutional:   No-   weight loss, night sweats, fevers, chills, fatigue, lassitude. HEENT:   + headaches, no-difficulty swallowing, tooth/dental problems, sore throat,       No-  sneezing, +itching, +ear ache, +nasal congestion, post nasal drip,  CV:  No-   chest pain, orthopnea, PND, swelling in lower extremities, anasarca, dizziness, palpitations Resp: No-   shortness of breath with exertion or at rest.              No-productive cough, no-non-productive cough,  No- coughing up of blood.              No-   change in color of mucus.  No- wheezing.   Skin: No-   rash or lesions. GI:  No-   heartburn, indigestion, abdominal pain, nausea, vomiting,  GU: . MS:  No-   joint pain or swelling.   Neuro-     nothing unusual Psych:  No- change in mood or affect. No depression or anxiety.  No memory loss.  OBJ- Physical Exam General- Alert, Oriented, Affect-appropriate, Distress- none acute. Looks comfortable Skin- rash-none, lesions- none, excoriation- none Lymphadenopathy- none Head- atraumatic            Eyes- Gross vision intact, PERRLA, conjunctivae and secretions clear            Ears- Hearing, canals-normal            Nose- + white mucus both nostrils, no-Septal dev, mucus, polyps, erosion, perforation  Throat- Mallampati II , mucosa clear , drainage- none, tonsils- atrophic Neck- flexible , trachea midline, no stridor , thyroid nl, carotid no bruit Chest - symmetrical excursion , unlabored           Heart/CV- RRR , no murmur , no gallop  , no rub, nl s1 s2                           - JVD- none , edema- none, stasis changes- none, varices- none           Lung- clear  to P&A, wheeze- none, cough- none , dullness-none, rub- none           Chest wall-  Abd- Br/ Gen/ Rectal- Not done, not indicated Extrem- cyanosis- none, clubbing, none, atrophy- none, strength- nl Neuro- grossly intact to observation

## 2015-10-17 NOTE — Patient Instructions (Signed)
Script sent for cefdinir antibiotic  Suggest you try to use your Neti pot once a day for awhile to help clear your sinuses

## 2015-10-20 NOTE — Assessment & Plan Note (Signed)
Sustained acute exacerbation Plan-Cefdinir, use Neti pot

## 2015-10-24 ENCOUNTER — Encounter: Payer: Self-pay | Admitting: Family Medicine

## 2015-10-24 ENCOUNTER — Ambulatory Visit (INDEPENDENT_AMBULATORY_CARE_PROVIDER_SITE_OTHER): Payer: Commercial Managed Care - HMO | Admitting: Family Medicine

## 2015-10-24 VITALS — BP 122/70 | HR 70 | Ht 63.0 in | Wt 209.0 lb

## 2015-10-24 DIAGNOSIS — M25561 Pain in right knee: Secondary | ICD-10-CM

## 2015-10-24 DIAGNOSIS — E669 Obesity, unspecified: Secondary | ICD-10-CM

## 2015-10-24 DIAGNOSIS — M25562 Pain in left knee: Secondary | ICD-10-CM | POA: Diagnosis not present

## 2015-10-24 DIAGNOSIS — M170 Bilateral primary osteoarthritis of knee: Secondary | ICD-10-CM

## 2015-10-24 MED ORDER — MELOXICAM 15 MG PO TABS: 15.0000 mg | ORAL_TABLET | Freq: Every day | ORAL | Status: DC

## 2015-10-24 NOTE — Assessment & Plan Note (Signed)
As above.

## 2015-10-24 NOTE — Patient Instructions (Signed)
Good to see you Ice is your friend Stay active We injected your knees again today.  If pain not a lot better we can do the other injections.  See me again otherwise when you need me.  Happy New Year!

## 2015-10-24 NOTE — Progress Notes (Signed)
  [name] [address], [place] [address] Phone: [phone] Subjective:      CC: Bilateral knee pain follow up   [id number] [name] is a 70 y.o. female coming in with complaint of  bilateral knee pain. 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  Patient 4 months ago was given an injection in her knees bilaterally. Patient was doing fairly well for some time but is having increasing pain and worsening symptoms. Some mild instability noted. Patient continues to try and the other conservative therapies but not helping as much. Has only responded to the injections previously..    Past medical history, social, surgical and family history all reviewed and no pertinent info pertaining to chief complaint. All other was reviewed using the EMR.    Review of Systems: No headache, visual changes, nausea, vomiting, diarrhea, constipation, dizziness, abdominal pain, skin rash, fevers, chills, night sweats, weight loss, swollen lymph nodes, body aches, joint swelling, muscle aches, chest pain, shortness of breath, mood changes.   Objective Blood pressure 122/70, pulse 70, height 5\' 3"  (1.6 m), weight 209 lb (94.802 kg), SpO2 92 %.  General: No apparent distress alert and oriented x3 mood and affect normal, dressed appropriately. Overweight HEENT: Pupils equal, extraocular movements intact  Respiratory: Patient's speak in full sentences and does not appear short of breath  Cardiovascular: No lower extremity edema, non tender, no erythema  Skin: Warm dry intact with no signs of infection or rash on extremities or on axial skeleton.  Abdomen: Soft nontender  Neuro: Cranial nerves II through XII are intact, neurovascularly intact in all extremities with 2+ DTRs and 2+ pulses.  Lymph: No lymphadenopathy of posterior or anterior cervical chain or axillae bilaterally.    Gait antalgic gait which is new MSK:  Non tender with full range of motion and good stability and symmetric strength and tone of shoulders, elbows, wrist, hips bilaterally.  Knee: Bilateral Valgus deformity of the knees noted Moderate to severe tenderness of the medial joint line bilaterally No decreased lacking the last 7 of flexion which is worse than previous exam Ligaments with solid consistent endpoints including ACL, PCL, LCL, MCL. She  does have some instability with valgus stress Negative Mcmurray's, Apley's, and Thessalonian tests. Patellar glide moderate crepitus. Patellar and quadriceps tendons unremarkable. Hamstring and quadriceps strength is normal.  Mild worsening from previous exam  After informed written and verbal consent, patient was seated on exam table. Right knee was prepped with alcohol swab and utilizing anterolateral approach, patient's right knee space was injected with 4:1  marcaine 0.5%: Kenalog 40mg /dL. Patient tolerated the procedure well without immediate complications.  After informed written and verbal consent, patient was seated on exam table. Left knee was prepped with alcohol swab and utilizing anterolateral approach, patient's left knee space was injected with 4:1  marcaine 0.5%: Kenalog 40mg /dL. Patient tolerated the procedure well without immediate complications.    Impression and Recommendations:     This case required medical decision making of moderate complexity.

## 2015-10-24 NOTE — Assessment & Plan Note (Signed)
Worsening symptoms but establish problem. Patient did have injections in the knees again bilaterally. Patient can have this repeat every 3 months if needed. Patient would be a candidate for viscous supplementation and we may need to consider this. Independent visualization of patient's x-rays do show end-stage bone-on-bone arthritis that were reviewed again today. We discussed the icing regimen. Patient declined formal physical therapy. We discussed the possibility of custom bracing. Patient will continue all other medications and refills given today. Patient and will come back and see me again in 1 month for further evaluation and treatment.

## 2015-10-24 NOTE — Assessment & Plan Note (Signed)
Encourage weight loss. 

## 2015-10-24 NOTE — Progress Notes (Signed)
Pre visit review using our clinic review tool, if applicable. No additional management support is needed unless otherwise documented below in the visit note. 

## 2015-10-25 ENCOUNTER — Telehealth: Payer: Self-pay | Admitting: Internal Medicine

## 2015-10-25 MED ORDER — AMOXICILLIN-POT CLAVULANATE 875-125 MG PO TABS: 1.0000 | ORAL_TABLET | Freq: Two times a day (BID) | ORAL | Status: DC

## 2015-10-25 NOTE — Telephone Encounter (Signed)
Patient cb, please cb at previous number listed

## 2015-10-25 NOTE — Telephone Encounter (Signed)
Left message for patient to call back  

## 2015-10-25 NOTE — Telephone Encounter (Signed)
Spoke with pt. States she is not feeling any better from when she saw CY on 10/17/15. Still reporting sinus congestion, sneezing, headache. Denies coughing or fever. Was given Cefdinir and has 1 day left. Wondering if she needs something else to take. CY - please advise. Thanks.  Allergies  Allergen Reactions  . Soybean-Containing Drug Products     Allergy testing positive for soy beans but pt uses soy sauce  . Sulfonamide Derivatives    Current Outpatient Prescriptions on File Prior to Visit  Medication Sig Dispense Refill  . atorvastatin (LIPITOR) 20 MG tablet Take 1 tablet (20 mg total) by mouth daily. 90 tablet 2  . azelastine (ASTELIN) 0.1 % nasal spray Place 1-2 sprays into both nostrils at bedtime. 30 mL 5  . cefdinir (OMNICEF) 300 MG capsule Take 1 capsule (300 mg total) by mouth 2 (two) times daily. 20 capsule 0  . cetirizine (ZYRTEC) 10 MG tablet Take 1 tablet (10 mg total) by mouth daily. As needed 90 tablet 3  . Cholecalciferol (VITAMIN D3) 2000 UNITS capsule Take 2,000 Units by mouth daily.    Marland Kitchen diltiazem (TIAZAC) 120 MG 24 hr capsule Take 1 capsule (120 mg total) by mouth daily. 90 capsule 2  . esomeprazole (NEXIUM) 40 MG capsule Take 1 capsule (40 mg total) by mouth daily. 30 capsule 3  . ferrous sulfate 325 (65 FE) MG tablet Take 1 tablet (325 mg total) by mouth daily with breakfast. 30 tablet 2  . losartan-hydrochlorothiazide (HYZAAR) 100-25 MG per tablet Take 1 tablet by mouth daily. 90 tablet 2  . meloxicam (MOBIC) 15 MG tablet Take 1 tablet (15 mg total) by mouth daily. 90 tablet 1  . metoprolol (LOPRESSOR) 50 MG tablet Take 1 tablet (50 mg total) by mouth 2 (two) times daily. 180 tablet 2  . sertraline (ZOLOFT) 50 MG tablet Take 1 tablet (50 mg total) by mouth daily. 90 tablet 2  . sucralfate (CARAFATE) 1 G tablet Take 1 g by mouth as needed.    . traMADol (ULTRAM) 50 MG tablet 1-2 tabs by mouth every 6 hours as needed for pain 120 tablet 5  . triamcinolone  (NASACORT AQ) 55 MCG/ACT AERO nasal inhaler Place 2 sprays into the nose daily. 1 Inhaler 12  . triamcinolone cream (KENALOG) 0.1 % Apply topically 2 (two) times daily. (Patient taking differently: Apply topically as needed. ) 30 g 0  . Turmeric 500 MG CAPS Take 1 capsule by mouth daily.     No current facility-administered medications on file prior to visit.

## 2015-10-25 NOTE — Telephone Encounter (Signed)
Spoke with pt. She is aware of CY's recommendation. Rx has been sent in. Nothing further was needed.  

## 2015-10-25 NOTE — Telephone Encounter (Signed)
Offer augmentin 875, # 14, 1 twice daily 

## 2015-10-30 ENCOUNTER — Telehealth: Payer: Self-pay | Admitting: Internal Medicine

## 2015-10-30 MED ORDER — ESOMEPRAZOLE MAGNESIUM 40 MG PO CPDR: 40.0000 mg | DELAYED_RELEASE_CAPSULE | Freq: Every day | ORAL | Status: DC

## 2015-10-30 NOTE — Telephone Encounter (Signed)
Rx sent 

## 2015-11-01 ENCOUNTER — Ambulatory Visit: Payer: Commercial Managed Care - HMO

## 2015-11-27 ENCOUNTER — Ambulatory Visit: Payer: Medicare HMO

## 2015-12-09 ENCOUNTER — Ambulatory Visit
Admission: RE | Admit: 2015-12-09 | Discharge: 2015-12-09 | Disposition: A | Discharge status: Home / Self Care | Payer: Commercial Managed Care - HMO | Source: Ambulatory Visit

## 2015-12-09 DIAGNOSIS — Z1231 Encounter for screening mammogram for malignant neoplasm of breast: Secondary | ICD-10-CM

## 2015-12-17 ENCOUNTER — Telehealth: Payer: Self-pay | Admitting: Internal Medicine

## 2015-12-17 MED ORDER — AZITHROMYCIN 250 MG PO TABS: ORAL_TABLET | ORAL | Status: DC

## 2015-12-17 NOTE — Telephone Encounter (Signed)
Ok to offer Zpak. She will mostly need to use traditional remedies- throat lozenges, extra fluids, stay warm and rested.

## 2015-12-17 NOTE — Telephone Encounter (Signed)
Pt is aware of recs per CY and Rx sent to [facility]. Nothing more needed at this time.

## 2015-12-17 NOTE — Telephone Encounter (Signed)
Spoke with pt, since yesterday pt c/o chills, sinus congestion, runny nose, PND, sore throat.  Denies cough, mucus production, fever.   Pt taking zyrtec and flonase.   Pt uses [facility] on [address].    Last ov: 10/17/2015 Next ov: 10/16/2016  CY please advise on recs.  Thanks.   Allergies  Allergen Reactions  . Soybean-Containing Drug Products     Allergy testing positive for soy beans but pt uses soy sauce  . Sulfonamide Derivatives    Current Outpatient Prescriptions on File Prior to Visit  Medication Sig Dispense Refill  . amoxicillin-clavulanate (AUGMENTIN) 875-125 MG tablet Take 1 tablet by mouth 2 (two) times daily. 14 tablet 0  . atorvastatin (LIPITOR) 20 MG tablet Take 1 tablet (20 mg total) by mouth daily. 90 tablet 2  . azelastine (ASTELIN) 0.1 % nasal spray Place 1-2 sprays into both nostrils at bedtime. 30 mL 5  . cefdinir (OMNICEF) 300 MG capsule Take 1 capsule (300 mg total) by mouth 2 (two) times daily. 20 capsule 0  . cetirizine (ZYRTEC) 10 MG tablet Take 1 tablet (10 mg total) by mouth daily. As needed 90 tablet 3  . Cholecalciferol (VITAMIN D3) 2000 UNITS capsule Take 2,000 Units by mouth daily.    Marland Kitchen diltiazem (TIAZAC) 120 MG 24 hr capsule Take 1 capsule (120 mg total) by mouth daily. 90 capsule 2  . esomeprazole (NEXIUM) 40 MG capsule Take 1 capsule (40 mg total) by mouth daily. 30 capsule 1  . ferrous sulfate 325 (65 FE) MG tablet Take 1 tablet (325 mg total) by mouth daily with breakfast. 30 tablet 2  . losartan-hydrochlorothiazide (HYZAAR) 100-25 MG per tablet Take 1 tablet by mouth daily. 90 tablet 2  . meloxicam (MOBIC) 15 MG tablet Take 1 tablet (15 mg total) by mouth daily. 90 tablet 1  . metoprolol (LOPRESSOR) 50 MG tablet Take 1 tablet (50 mg total) by mouth 2 (two) times daily. 180 tablet 2  . sertraline (ZOLOFT) 50 MG tablet Take 1 tablet (50 mg total) by mouth daily. 90 tablet 2  . sucralfate (CARAFATE) 1 G tablet Take 1 g by mouth as needed.    . traMADol  (ULTRAM) 50 MG tablet 1-2 tabs by mouth every 6 hours as needed for pain 120 tablet 5  . triamcinolone (NASACORT AQ) 55 MCG/ACT AERO nasal inhaler Place 2 sprays into the nose daily. 1 Inhaler 12  . triamcinolone cream (KENALOG) 0.1 % Apply topically 2 (two) times daily. (Patient taking differently: Apply topically as needed. ) 30 g 0  . Turmeric 500 MG CAPS Take 1 capsule by mouth daily.     No current facility-administered medications on file prior to visit.

## 2015-12-20 ENCOUNTER — Telehealth: Payer: Self-pay | Admitting: Family

## 2015-12-20 ENCOUNTER — Ambulatory Visit (INDEPENDENT_AMBULATORY_CARE_PROVIDER_SITE_OTHER): Payer: Commercial Managed Care - HMO

## 2015-12-20 VITALS — BP 134/80 | Ht 63.0 in | Wt 210.5 lb

## 2015-12-20 DIAGNOSIS — E2839 Other primary ovarian failure: Secondary | ICD-10-CM

## 2015-12-20 DIAGNOSIS — Z0000 Encounter for general adult medical examination without abnormal findings: Secondary | ICD-10-CM | POA: Diagnosis not present

## 2015-12-20 NOTE — Progress Notes (Addendum)
Subjective:   [name] is a 70 y.o. female who presents for Medicare Annual (Subsequent) preventive examination.  Review of Systems:  HRA assessment completed during visit; Colin Ina; [phone] The Patient was informed that this wellness visit is to identify risk and educate on how to reduce risk for increase disease through lifestyle changes.   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   Hx of lost weight many years ago   Exercise;  Goes to gym sometime; Bikes for legs; would like to try treadmill Cousin inviting her to go to a dance class; will commit to trying Also discussed benefit of pool aerobics and will consider this as well;  [facility] that [facility]; will attempt to go 3 days a week and up to 5 would help weight loss  SAFETY; son stays with her; one level  Removal of clutter clearing paths through the home,    Railing as needed; takes showers; problems getting in and out of shower;  Bathroom safety; may benefit from shower transfer bench; dtr is a nurse and will discuss with her.  Community safety; yes/ Oceanographer yes/  Firearms safety / safe place  Driving accidents and seatbelt/ does not drive  Sun protection/sits out some;  Stressors ; no stress  Medication review/ no issues   Fall assessment no  Gait assessment; slow up and down  Mobilization and Functional losses in the last year.not really  Sleep patterns/ sometimes she sleeps; sometimes wakes up in the night   Urinary or fecal incontinence reviewed/ no Goes to the bathroom at hs but does not feel this is a problem  Counseling: Colonoscopy; 12/10/2014; repeat in 5 years; 11/2019 EKG: 02/2014 Mammogram 10/2014 neg/ just had one this year; Feb 20th; the [facility] center Dexa; don't see report/ will have this done at [facility]; will order and will plan to come in when she has labs drawn prior to PE after 5/9  PAP/ no;  Hearing: 4000hz  both ears  Ophthalmology exam; Dr. Katy Fitch did this year;  Has to get glasses;   Immunizations: zostavax; Explained that this is Part D and may get a better benefit at pharmacy TD due 2010/ will try to get this year.  Health advice discussed regarding goal to lose weight to 185lbs   Current Care Team reviewed and updated  Cardiac Risk Factors include: advanced  age (>79men, >29 women);family history of premature cardiovascular disease;obesity (BMI >30kg/m2)     Objective:     Vitals: BP 134/80 mmHg  Ht 5\' 3"  (1.6 m)  Wt 210 lb 8 oz (95.482 kg)  BMI 37.30 kg/m2  Tobacco History  Smoking status  . Never Smoker   Smokeless tobacco  . Never Used     Counseling given: Not Answered   Past Medical History  Diagnosis Date  . HTN (hypertension)   . Hyperlipidemia   . Obesity   . Osteoarthritis   . DJD (degenerative joint disease), lumbar   . Chronic headaches   . GERD (gastroesophageal  reflux disease)   . Allergic rhinitis, cause unspecified   . Hiatal hernia   . Hemorrhoids   . Diverticulosis   . Tubular adenoma of colon   . Anemia   . Allergy     SEASONAL   Past Surgical History  Procedure Laterality Date  . Tubal ligation    . Colonoscopy    . Ablation     Family History  Problem Relation Age of Onset  . Heart disease Mother   . Hypertension Mother   . Diabetes Brother   . Colon cancer Neg Hx   . Esophageal cancer Neg Hx   . Rectal cancer Neg Hx   . Stomach cancer Neg Hx    History  Sexual Activity  . Sexual Activity: Not on file    Outpatient Encounter Prescriptions as of 12/20/2015  Medication Sig  . amoxicillin-clavulanate (AUGMENTIN) 875-125 MG tablet Take 1 tablet by mouth 2 (two) times daily.  Marland Kitchen atorvastatin (LIPITOR) 20 MG tablet Take 1 tablet (20 mg total) by mouth daily.  Marland Kitchen azelastine (ASTELIN) 0.1 % nasal spray Place 1-2 sprays into both nostrils at bedtime.  Marland Kitchen azithromycin (ZITHROMAX Z-PAK) 250 MG tablet Take as directed  . cetirizine (ZYRTEC) 10 MG tablet Take 1 tablet (10 mg total) by mouth daily. As needed  . Cholecalciferol (VITAMIN D3) 2000 UNITS capsule Take 2,000 Units by mouth daily.  Marland Kitchen diltiazem (TIAZAC) 120 MG 24 hr capsule Take 1 capsule (120 mg total) by mouth daily.  Marland Kitchen esomeprazole (NEXIUM) 40 MG capsule Take 1 capsule (40 mg total) by mouth daily.  . ferrous sulfate 325 (65 FE) MG tablet Take 1 tablet (325 mg total) by mouth daily with breakfast.  . losartan-hydrochlorothiazide (HYZAAR) 100-25 MG per tablet Take 1 tablet by mouth daily.  . meloxicam (MOBIC) 15 MG tablet Take 1 tablet (15 mg total) by mouth daily.  . metoprolol (LOPRESSOR) 50 MG tablet Take 1 tablet (50 mg total) by mouth 2 (two) times daily.  . sertraline (ZOLOFT) 50 MG tablet Take 1 tablet (50 mg total) by mouth daily.  Marland Kitchen triamcinolone (NASACORT AQ) 55 MCG/ACT AERO nasal inhaler Place 2 sprays into the nose daily.  . cefdinir (OMNICEF) 300 MG capsule Take 1  capsule (300 mg total) by mouth 2 (two) times daily. (Patient not taking: Reported on 12/20/2015)  . sucralfate (CARAFATE) 1 G tablet Take 1 g by mouth as needed. Reported on 12/20/2015  . traMADol (ULTRAM) 50 MG tablet 1-2 tabs by mouth every 6 hours as needed for pain (Patient not taking: Reported on 12/20/2015)  . triamcinolone cream (KENALOG) 0.1 % Apply topically 2 (two) times daily. (Patient taking differently: Apply topically as needed. )  . Turmeric 500 MG CAPS Take 1 capsule by mouth daily.   No facility-administered encounter medications on file as of 12/20/2015.    Activities of  Daily Living In your present state of health, do you have any difficulty performing the following activities: 12/20/2015  Hearing? N  Vision? N  Difficulty concentrating or making decisions? N  Walking or climbing stairs? Y  Dressing or bathing? N  Doing errands, shopping? N  Preparing Food and eating ? N  Using the Toilet? N  In the past six months, have you accidently leaked urine? N  Do you have problems with loss of bowel control? N  Managing your Medications? N  Managing your Finances? N  Housekeeping or managing your Housekeeping? N    Patient Care Team: Biagio Borg, MD as PCP - General (Internal Medicine)    Assessment:    Assessment   Patient presents for yearly preventative medicine examination. Medicare questionnaire screening were completed, i.e. Functional; fall risk; depression, memory loss and hearing were unremarkable  All immunizations and health maintenance protocols were reviewed with the patient did not want shingles or TD today; will consider later this year.  Education provided for laboratory screens;  Chol   Medication reconciliation, past medical history, social history, problem list and allergies were reviewed in detail with the patient  Goals were established with regard to weight loss and exercise, in compliance with medications based on the patient individualized risk;     End of life planning was discussed. Did take a copy of an Advanced Directive from [facility] and discuss with the family    Exercise Activities and Dietary recommendations Current Exercise Habits:: Structured exercise class, Type of exercise: strength training/weights;walking;Other - see comments (water aerobics / biking/ dance), Time (Minutes): 45, Frequency (Times/Week): 3, Weekly Exercise (Minutes/Week): 135, Intensity: Moderate  Goals    . Weight < 185 lb (83.915 kg)     Plan is to eat less "white" rice; pasta;  Given information on an 1800 calorie      Fall Risk Fall Risk  12/20/2015 03/27/2015 03/15/2014 08/01/2013  Falls in the past year? No No No No   Depression Screen PHQ 2/9 Scores 12/20/2015 03/27/2015 03/15/2014 08/01/2013  PHQ - 2 Score 0 0 0 0     Cognitive Testing No flowsheet data found.  Ad8 score 0   Immunization History  Administered Date(s) Administered  . Influenza Split 09/30/2012  . Influenza, High Dose Seasonal PF 08/01/2013  . Influenza,inj,Quad PF,36+ Mos 07/26/2015  . Pneumococcal Conjugate-13 03/15/2014  . Pneumococcal Polysaccharide-23 01/30/2013  . Tdap 01/18/1999   Screening Tests Health Maintenance  Topic Date Due  . ZOSTAVAX  02/03/2007  . TETANUS/TDAP  01/17/2009  . DEXA SCAN  02/03/2012  . INFLUENZA VACCINE  05/19/2016  . MAMMOGRAM  12/08/2017  . COLONOSCOPY  12/11/2019  . Hepatitis C Screening  Completed  . PNA vac Low Risk Adult  Completed      Plan:   Will have dexa (bone density) downstairs;  Will not have anything to eat or drink prior to labs and June apt. Will have dexa when you get your labs (Last labs were 2015 ) future labs in system from 2016; not drawn  Will review advanced directive or discuss with dtr who is a nurse  Discussed shower transfer bench for safety in bathroom; Shower in tub and difficult to get in and out of tub   Will call insurance and check on cost for Tetanus and shingles;  Or check with the pharmacy    During the course of the visit the patient was educated and counseled about the following appropriate screening and preventive services:  Vaccines to include Pneumoccal, Influenza, Hepatitis B, Td, Zostavax, HCV  Electrocardiogram - 02/2014  Cardiovascular Disease/ chol good; discussed weight loss  Colorectal cancer screening; 11/2019  Bone density screening/ to be scheduled / Elam to call   Diabetes screening/ next lab draw  Glaucoma screening/ already completed this year and was neg  Mammography/Feb 2017 at the breast center  Nutrition counseling / educated and committed [facility]   Patient Instructions (the written plan) was given to the patient.   [id number], RN  12/20/2015   Medical screening examination/treatment/procedure(s) were performed by non-physician practitioner and as supervising provider I was immediately available for consultation/collaboration. I agree with above. Mauricio Po, FNP

## 2015-12-20 NOTE — Patient Instructions (Addendum)
[name] , Thank you for taking time to come for your Medicare Wellness Visit. I appreciate your ongoing commitment to your health goals. Please review the following plan we discussed and let me know if I can assist you in the future.   Will have dexa (bone density) downstairs;  Will not have anything to eat or drink prior to labs and June apt. Will have dexa when you get your labs (Last labs were 2016) future labs in system  Will review advanced directive or discuss with dtr.  Will call insurance and check on cost for Tetanus and shingles;  Or check with the pharmacy   These are the goals we discussed: Goals    . Weight < 185 lb (83.915 kg)     Plan is to eat less "white" rice; pasta;  Given information on an 1800 calorie       This is a list of the screening recommended for you and due dates:  Health Maintenance  Topic Date Due  . Shingles Vaccine  02/03/2007  . Tetanus Vaccine  01/17/2009  . DEXA scan (bone density measurement)  02/03/2012  . Flu Shot  05/19/2016  . Mammogram  12/08/2017  . Colon Cancer Screening  12/11/2019  .  Hepatitis C: One time screening is recommended by Center for Disease Control  (CDC) for  adults born from [birth year] through 1964.   Completed  . Pneumonia vaccines  Completed     Bone Densitometry Bone densitometry is an imaging test that uses a special X-ray to measure the amount of calcium and other minerals in your bones (bone density). This test is also known as a bone mineral density test or dual-energy X-ray absorptiometry (DXA). The test can measure bone density at your hip and your spine. It is similar to having a regular X-ray. You may have this test to:  Diagnose a condition that causes weak or thin bones (osteoporosis).  Predict your risk of a broken bone (fracture).  Determine how well osteoporosis treatment is working. LET [facility] CARE PROVIDER KNOW ABOUT:  Any allergies you have.  All medicines you are taking, including  vitamins, herbs, eye drops, creams, and over-the-counter medicines.  Previous problems you or members of your family have had with the use of anesthetics.  Any blood disorders you have.  Previous surgeries you have had.  Medical conditions you have.  Possibility of pregnancy.  Any other medical test you had within the previous 14 days that used contrast material. RISKS AND COMPLICATIONS Generally, this is a safe procedure. However, problems can occur and may include the following:  This test exposes you to a very small amount of radiation.  The risks of radiation exposure may be greater to unborn children. BEFORE THE PROCEDURE  Do not take any calcium supplements for 24 hours before having the test. You can otherwise eat and drink what you usually do.  Take off all metal jewelry, eyeglasses, dental appliances, and any other metal objects. PROCEDURE  You may lie on an exam table. There will be an X-ray generator below you and an imaging device above you.  Other devices, such as boxes or braces, may be used to position your body properly for the scan.  You will need to lie still while the machine slowly scans your body.  The images will show up on a computer monitor. AFTER THE PROCEDURE You may need more testing at a later time.   This information is not intended to replace advice given  to you by your health care provider. Make sure you discuss any questions you have with your health care provider.   Document Released: 10/27/2004 Document Revised: 10/26/2014 Document Reviewed: 03/15/2014 Elsevier Interactive Patient Education 2016 [employer]  Fat and Cholesterol Restricted Diet Getting too much fat and cholesterol in your diet may cause health problems. Following this diet helps keep your fat and cholesterol at normal levels. This can keep you from getting sick. WHAT TYPES OF FAT SHOULD I CHOOSE?  Choose monosaturated and polyunsaturated fats. These are found in foods  such as olive oil, canola oil, flaxseeds, walnuts, almonds, and seeds.  Eat more omega-3 fats. Good choices include salmon, mackerel, sardines, tuna, flaxseed oil, and ground flaxseeds.  Limit saturated fats. These are in animal products such as meats, butter, and cream. They can also be in plant products such as palm oil, palm kernel oil, and coconut oil.   Avoid foods with partially hydrogenated oils in them. These contain trans fats. Examples of foods that have trans fats are stick margarine, some tub margarines, cookies, crackers, and other baked goods. WHAT GENERAL GUIDELINES DO I NEED TO FOLLOW?   Check food labels. Look for the words "trans fat" and "saturated fat."  When preparing a meal:  Fill half of your plate with vegetables and green salads.  Fill one fourth of your plate with whole grains. Look for the word "whole" as the first word in the ingredient list.  Fill one fourth of your plate with lean protein foods.  Limit fruit to two servings a day. Choose fruit instead of juice.  Eat more foods with soluble fiber. Examples of foods with this type of fiber are apples, broccoli, carrots, beans, peas, and barley. Try to get 20-30 g (grams) of fiber per day.  Eat more home-cooked foods. Eat less at restaurants and buffets.  Limit or avoid alcohol.  Limit foods high in starch and sugar.  Limit fried foods.  Cook foods without frying them. Baking, boiling, grilling, and broiling are all great options.  Lose weight if you are overweight. Losing even a small amount of weight can help your overall health. It can also help prevent diseases such as diabetes and heart disease. WHAT FOODS CAN I EAT? Grains Whole grains, such as whole wheat or whole grain breads, crackers, cereals, and pasta. Unsweetened oatmeal, bulgur, barley, quinoa, or brown rice. Corn or whole wheat flour tortillas. Vegetables Fresh or frozen vegetables (raw, steamed, roasted, or grilled). Green  salads. Fruits All fresh, canned (in natural juice), or frozen fruits. Meat and Other Protein Products Ground beef (85% or leaner), grass-fed beef, or beef trimmed of fat. Skinless chicken or [place]. Ground chicken or [place]. Pork trimmed of fat. All fish and seafood. Eggs. Dried beans, peas, or lentils. Unsalted nuts or seeds. Unsalted canned or dry beans. Dairy Low-fat dairy products, such as skim or 1% milk, 2% or reduced-fat cheeses, low-fat ricotta or cottage cheese, or plain low-fat yogurt. Fats and Oils Tub margarines without trans fats. Light or reduced-fat mayonnaise and salad dressings. Avocado. Olive, canola, sesame, or safflower oils. Natural peanut or almond butter (choose ones without added sugar and oil). The items listed above may not be a complete list of recommended foods or beverages. Contact your dietitian for more options. WHAT FOODS ARE NOT RECOMMENDED? Grains White bread. White pasta. White rice. Cornbread. Bagels, pastries, and croissants. Crackers that contain trans fat. Vegetables White potatoes. Corn. Creamed or fried vegetables. Vegetables in a cheese sauce. Fruits Dried  fruits. Canned fruit in light or heavy syrup. Fruit juice. Meat and Other Protein Products Fatty cuts of meat. Ribs, chicken wings, bacon, sausage, bologna, salami, chitterlings, fatback, hot dogs, bratwurst, and packaged luncheon meats. Liver and organ meats. Dairy Whole or 2% milk, cream, half-and-half, and cream cheese. Whole milk cheeses. Whole-fat or sweetened yogurt. Full-fat cheeses. Nondairy creamers and whipped toppings. Processed cheese, cheese spreads, or cheese curds. Sweets and Desserts Corn syrup, sugars, honey, and molasses. Candy. Jam and jelly. Syrup. Sweetened cereals. Cookies, pies, cakes, donuts, muffins, and ice cream. Fats and Oils Butter, stick margarine, lard, shortening, ghee, or bacon fat. Coconut, palm kernel, or palm oils. Beverages Alcohol. Sweetened drinks (such as  sodas, lemonade, and fruit drinks or punches). The items listed above may not be a complete list of foods and beverages to avoid. Contact your dietitian for more information.   This information is not intended to replace advice given to you by your health care provider. Make sure you discuss any questions you have with your health care provider.   Document Released: 04/05/2012 Document Revised: 10/26/2014 Document Reviewed: 01/04/2014 Elsevier Interactive Patient Education 2016 [place] in the Home  Falls can cause injuries. They can happen to people of all ages. There are many things you can do to make your home safe and to help prevent falls.  WHAT CAN I DO ON THE OUTSIDE OF MY HOME?  Regularly fix the edges of walkways and driveways and fix any cracks.  Remove anything that might make you trip as you walk through a door, such as a raised step or threshold.  Trim any bushes or trees on the path to your home.  Use bright outdoor lighting.  Clear any walking paths of anything that might make someone trip, such as rocks or tools.  Regularly check to see if handrails are loose or broken. Make sure that both sides of any steps have handrails.  Any raised decks and porches should have guardrails on the edges.  Have any leaves, snow, or ice cleared regularly.  Use sand or salt on walking paths during winter.  Clean up any spills in your garage right away. This includes oil or grease spills. WHAT CAN I DO IN THE BATHROOM?   Use night lights.  Install grab bars by the toilet and in the tub and shower. Do not use towel bars as grab bars.  Use non-skid mats or decals in the tub or shower.  If you need to sit down in the shower, use a plastic, non-slip stool.  Keep the floor dry. Clean up any water that spills on the floor as soon as it happens.  Remove soap buildup in the tub or shower regularly.  Attach bath mats securely with double-sided non-slip rug  tape.  Do not have throw rugs and other things on the floor that can make you trip. WHAT CAN I DO IN THE BEDROOM?  Use night lights.  Make sure that you have a light by your bed that is easy to reach.  Do not use any sheets or blankets that are too big for your bed. They should not hang down onto the floor.  Have a firm chair that has side arms. You can use this for support while you get dressed.  Do not have throw rugs and other things on the floor that can make you trip. WHAT CAN I DO IN THE KITCHEN?  Clean up any spills right away.  Avoid walking on  wet floors.  Keep items that you use a lot in easy-to-reach places.  If you need to reach something above you, use a strong step stool that has a grab bar.  Keep electrical cords out of the way.  Do not use floor polish or wax that makes floors slippery. If you must use wax, use non-skid floor wax.  Do not have throw rugs and other things on the floor that can make you trip. WHAT CAN I DO WITH MY STAIRS?  Do not leave any items on the stairs.  Make sure that there are handrails on both sides of the stairs and use them. Fix handrails that are broken or loose. Make sure that handrails are as long as the stairways.  Check any carpeting to make sure that it is firmly attached to the stairs. Fix any carpet that is loose or worn.  Avoid having throw rugs at the top or bottom of the stairs. If you do have throw rugs, attach them to the floor with carpet tape.  Make sure that you have a light switch at the top of the stairs and the bottom of the stairs. If you do not have them, ask someone to add them for you. WHAT ELSE CAN I DO TO HELP PREVENT FALLS?  Wear shoes that:  Do not have high heels.  Have rubber bottoms.  Are comfortable and fit you well.  Are closed at the toe. Do not wear sandals.  If you use a stepladder:  Make sure that it is fully opened. Do not climb a closed stepladder.  Make sure that both sides of the  stepladder are locked into place.  Ask someone to hold it for you, if possible.  Clearly mark and make sure that you can see:  Any grab bars or handrails.  First and last steps.  Where the edge of each step is.  Use tools that help you move around (mobility aids) if they are needed. These include:  Canes.  Walkers.  Scooters.  Crutches.  Turn on the lights when you go into a dark area. Replace any light bulbs as soon as they burn out.  Set up your furniture so you have a clear path. Avoid moving your furniture around.  If any of your floors are uneven, fix them.  If there are any pets around you, be aware of where they are.  Review your medicines with your doctor. Some medicines can make you feel dizzy. This can increase your chance of falling. Ask your doctor what other things that you can do to help prevent falls.   This information is not intended to replace advice given to you by your health care provider. Make sure you discuss any questions you have with your health care provider.   Document Released: 08/01/2009 Document Revised: 02/19/2015 Document Reviewed: 11/09/2014 Elsevier Interactive Patient Education 2016 [place] Maintenance, Female Adopting a healthy lifestyle and getting preventive care can go a long way to promote health and wellness. Talk with your health care provider about what schedule of regular examinations is right for you. This is a good chance for you to check in with your provider about disease prevention and staying healthy. In between checkups, there are plenty of things you can do on your own. Experts have done a lot of research about which lifestyle changes and preventive measures are most likely to keep you healthy. Ask your health care provider for more information. WEIGHT AND DIET  Eat a  healthy diet  Be sure to include plenty of vegetables, fruits, low-fat dairy products, and lean protein.  Do not eat a lot of foods high  in solid fats, added sugars, or salt.  Get regular exercise. This is one of the most important things you can do for your health.  Most adults should exercise for at least 150 minutes each week. The exercise should increase your heart rate and make you sweat (moderate-intensity exercise).  Most adults should also do strengthening exercises at least twice a week. This is in addition to the moderate-intensity exercise.  Maintain a healthy weight  Body mass index (BMI) is a measurement that can be used to identify possible weight problems. It estimates body fat based on height and weight. Your health care provider can help determine your BMI and help you achieve or maintain a healthy weight.  For females 74 years of age and older:   A BMI below 18.5 is considered underweight.  A BMI of 18.5 to 24.9 is normal.  A BMI of 25 to 29.9 is considered overweight.  A BMI of 30 and above is considered obese.  Watch levels of cholesterol and blood lipids  You should start having your blood tested for lipids and cholesterol at 69 years of age, then have this test every 5 years.  You may need to have your cholesterol levels checked more often if:  Your lipid or cholesterol levels are high.  You are older than 69 years of age.  You are at high risk for heart disease.  CANCER SCREENING   Lung Cancer  Lung cancer screening is recommended for adults 54-62 years old who are at high risk for lung cancer because of a history of smoking.  A yearly low-dose CT scan of the lungs is recommended for people who:  Currently smoke.  Have quit within the past 15 years.  Have at least a 30-pack-year history of smoking. A pack year is smoking an average of one pack of cigarettes a day for 1 year.  Yearly screening should continue until it has been 15 years since you quit.  Yearly screening should stop if you develop a health problem that would prevent you from having lung cancer treatment.   Breast Cancer  Practice breast self-awareness. This means understanding how your breasts normally appear and feel.  It also means doing regular breast self-exams. Let your health care provider know about any changes, no matter how small.  If you are in your 20s or 30s, you should have a clinical breast exam (CBE) by a health care provider every 1-3 years as part of a regular health exam.  If you are 78 or older, have a CBE every year. Also consider having a breast X-ray (mammogram) every year.  If you have a family history of breast cancer, talk to your health care provider about genetic screening.  If you are at high risk for breast cancer, talk to your health care provider about having an MRI and a mammogram every year.  Breast cancer gene (BRCA) assessment is recommended for women who have family members with BRCA-related cancers. BRCA-related cancers include:  Breast.  Ovarian.  Tubal.  Peritoneal cancers.  Results of the assessment will determine the need for genetic counseling and BRCA1 and BRCA2 testing. Cervical Cancer Your health care provider may recommend that you be screened regularly for cancer of the pelvic organs (ovaries, uterus, and vagina). This screening involves a pelvic examination, including checking for microscopic changes to the  surface of your cervix (Pap test). You may be encouraged to have this screening done every 3 years, beginning at age 29.  For women ages 76-65, health care providers may recommend pelvic exams and Pap testing every 3 years, or they may recommend the Pap and pelvic exam, combined with testing for human papilloma virus (HPV), every 5 years. Some types of HPV increase your risk of cervical cancer. Testing for HPV may also be done on women of any age with unclear Pap test results.  Other health care providers may not recommend any screening for nonpregnant women who are considered low risk for pelvic cancer and who do not have symptoms.  Ask your health care provider if a screening pelvic exam is right for you.  If you have had past treatment for cervical cancer or a condition that could lead to cancer, you need Pap tests and screening for cancer for at least 20 years after your treatment. If Pap tests have been discontinued, your risk factors (such as having a new sexual partner) need to be reassessed to determine if screening should resume. Some women have medical problems that increase the chance of getting cervical cancer. In these cases, your health care provider may recommend more frequent screening and Pap tests. Colorectal Cancer  This type of cancer can be detected and often prevented.  Routine colorectal cancer screening usually begins at 69 years of age and continues through 69 years of age.  Your health care provider may recommend screening at an earlier age if you have risk factors for colon cancer.  Your health care provider may also recommend using home test kits to check for hidden blood in the stool.  A small camera at the end of a tube can be used to examine your colon directly (sigmoidoscopy or colonoscopy). This is done to check for the earliest forms of colorectal cancer.  Routine screening usually begins at age 75.  Direct examination of the colon should be repeated every 5-10 years through 69 years of age. However, you may need to be screened more often if early forms of precancerous polyps or small growths are found. Skin Cancer  Check your skin from head to toe regularly.  Tell your health care provider about any new moles or changes in moles, especially if there is a change in a mole's shape or color.  Also tell your health care provider if you have a mole that is larger than the size of a pencil eraser.  Always use sunscreen. Apply sunscreen liberally and repeatedly throughout the day.  Protect yourself by wearing long sleeves, pants, a wide-brimmed hat, and sunglasses whenever you are  outside. HEART DISEASE, DIABETES, AND HIGH BLOOD PRESSURE   High blood pressure causes heart disease and increases the risk of stroke. High blood pressure is more likely to develop in:  People who have blood pressure in the high end of the normal range (130-139/85-89 mm Hg).  People who are overweight or obese.  People who are African American.  If you are 15-92 years of age, have your blood pressure checked every 3-5 years. If you are 70 years of age or older, have your blood pressure checked every year. You should have your blood pressure measured twice--once when you are at a hospital or clinic, and once when you are not at a hospital or clinic. Record the average of the two measurements. To check your blood pressure when you are not at a hospital or clinic, you can use:  An automated blood pressure machine at a pharmacy.  A home blood pressure monitor.  If you are between 56 years and 93 years old, ask your health care provider if you should take aspirin to prevent strokes.  Have regular diabetes screenings. This involves taking a blood sample to check your fasting blood sugar level.  If you are at a normal weight and have a low risk for diabetes, have this test once every three years after 69 years of age.  If you are overweight and have a high risk for diabetes, consider being tested at a younger age or more often. PREVENTING INFECTION  Hepatitis B  If you have a higher risk for hepatitis B, you should be screened for this virus. You are considered at high risk for hepatitis B if:  You were born in a country where hepatitis B is common. Ask your health care provider which countries are considered high risk.  Your parents were born in a high-risk country, and you have not been immunized against hepatitis B (hepatitis B vaccine).  You have HIV or AIDS.  You use needles to inject street drugs.  You live with someone who has hepatitis B.  You have had sex with someone who has  hepatitis B.  You get hemodialysis treatment.  You take certain medicines for conditions, including cancer, organ transplantation, and autoimmune conditions. Hepatitis C  Blood testing is recommended for:  Everyone born from [birth year] through 1964.  Anyone with known risk factors for hepatitis C. Sexually transmitted infections (STIs)  You should be screened for sexually transmitted infections (STIs) including gonorrhea and chlamydia if:  You are sexually active and are younger than 69 years of age.  You are older than 69 years of age and your health care provider tells you that you are at risk for this type of infection.  Your sexual activity has changed since you were last screened and you are at an increased risk for chlamydia or gonorrhea. Ask your health care provider if you are at risk.  If you do not have HIV, but are at risk, it may be recommended that you take a prescription medicine daily to prevent HIV infection. This is called pre-exposure prophylaxis (PrEP). You are considered at risk if:  You are sexually active and do not regularly use condoms or know the HIV status of your partner(s).  You take drugs by injection.  You are sexually active with a partner who has HIV. Talk with your health care provider about whether you are at high risk of being infected with HIV. If you choose to begin PrEP, you should first be tested for HIV. You should then be tested every 3 months for as long as you are taking PrEP.  PREGNANCY   If you are premenopausal and you may become pregnant, ask your health care provider about preconception counseling.  If you may become pregnant, take 400 to 800 micrograms (mcg) of folic acid every day.  If you want to prevent pregnancy, talk to your health care provider about birth control (contraception). OSTEOPOROSIS AND MENOPAUSE   Osteoporosis is a disease in which the bones lose minerals and strength with aging. This can result in serious bone  fractures. Your risk for osteoporosis can be identified using a bone density scan.  If you are 81 years of age or older, or if you are at risk for osteoporosis and fractures, ask your health care provider if you should be screened.  Ask your health care provider  whether you should take a calcium or vitamin D supplement to lower your risk for osteoporosis.  Menopause may have certain physical symptoms and risks.  Hormone replacement therapy may reduce some of these symptoms and risks. Talk to your health care provider about whether hormone replacement therapy is right for you.  HOME CARE INSTRUCTIONS   Schedule regular health, dental, and eye exams.  Stay current with your immunizations.   Do not use any tobacco products including cigarettes, chewing tobacco, or electronic cigarettes.  If you are pregnant, do not drink alcohol.  If you are breastfeeding, limit how much and how often you drink alcohol.  Limit alcohol intake to no more than 1 drink per day for nonpregnant women. One drink equals 12 ounces of beer, 5 ounces of wine, or 1 ounces of hard liquor.  Do not use street drugs.  Do not share needles.  Ask your health care provider for help if you need support or information about quitting drugs.  Tell your health care provider if you often feel depressed.  Tell your health care provider if you have ever been abused or do not feel safe at home.   This information is not intended to replace advice given to you by your health care provider. Make sure you discuss any questions you have with your health care provider.   Document Released: 04/20/2011 Document Revised: 10/26/2014 Document Reviewed: 09/06/2013 Elsevier Interactive Patient Education [employer].

## 2015-12-23 NOTE — Telephone Encounter (Signed)
Done

## 2015-12-31 ENCOUNTER — Other Ambulatory Visit: Payer: Self-pay | Admitting: Internal Medicine

## 2016-01-07 ENCOUNTER — Other Ambulatory Visit: Payer: Self-pay | Admitting: *Deleted

## 2016-01-07 MED ORDER — TRIAMCINOLONE ACETONIDE 0.1 % EX CREA: TOPICAL_CREAM | CUTANEOUS | Status: DC | PRN

## 2016-01-07 NOTE — Telephone Encounter (Signed)
Left msg on triage wanting to get refill on the cream MD rx couple months ago.called pt to verify name of cream pt is needing the Triamcinolone cream inform will send to [facility]...Johny Chess

## 2016-01-08 ENCOUNTER — Ambulatory Visit (INDEPENDENT_AMBULATORY_CARE_PROVIDER_SITE_OTHER): Payer: Commercial Managed Care - HMO | Admitting: Family Medicine

## 2016-01-08 VITALS — BP 132/80 | HR 76

## 2016-01-08 DIAGNOSIS — M1710 Unilateral primary osteoarthritis, unspecified knee: Secondary | ICD-10-CM | POA: Diagnosis not present

## 2016-01-08 NOTE — Patient Instructions (Addendum)
Great to see you  [name] is your friend Stay active.  Continue the vitamins See me again in 3-4 weeks and if in pain we will do the other type of injection called monovisc.

## 2016-01-08 NOTE — Progress Notes (Signed)
[name] [address], [place] [address] Phone: [phone] Subjective:      CC: Bilateral knee pain follow up   [id number] [name] is a 70 y.o. female coming in with complaint of  bilateral knee pain. 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  10 weeks ago patient was given bilateral injections. States that the pain started in about 4 weeks ago. Having worsening pain again. States though that she was completely resolved the pain for about 6 weeks.  Past Medical History  Diagnosis Date  . HTN (hypertension)   . Hyperlipidemia   . Obesity   . Osteoarthritis   . DJD (degenerative joint disease), lumbar   . Chronic headaches   . GERD (gastroesophageal reflux disease)   . Allergic rhinitis, cause unspecified   . Hiatal hernia   . Hemorrhoids   . Diverticulosis   . Tubular adenoma of colon   . Anemia   . Allergy     SEASONAL   Past Surgical History  Procedure Laterality Date  . Tubal ligation    . Colonoscopy    . Ablation     Social History  Substance Use Topics  . Smoking status: Never Smoker   . Smokeless tobacco: Never Used  . Alcohol Use: No   Allergies  Allergen Reactions  . Soybean-Containing Drug Products     Allergy testing positive for soy beans but pt uses soy sauce  . Sulfonamide Derivatives    Family History  Problem Relation Age of Onset  . Heart disease Mother   . Hypertension Mother   . Diabetes Brother   . Colon cancer Neg Hx   . Esophageal cancer Neg Hx   . Rectal cancer Neg Hx   . Stomach cancer Neg Hx        Past medical history, social, surgical and family history all reviewed and no pertinent info pertaining to chief complaint. All other was reviewed using the EMR.    Review of Systems: No headache, visual changes, nausea, vomiting, diarrhea, constipation, dizziness, abdominal pain, skin rash,  fevers, chills, night sweats, weight loss, swollen lymph nodes, body aches, joint swelling, muscle aches, chest pain, shortness of breath, mood changes.   Objective Blood pressure 132/80, pulse 76.  General: No apparent distress alert and oriented x3 mood and affect normal, dressed appropriately. Overweight HEENT: Pupils equal, extraocular movements intact  Respiratory: Patient's speak in full sentences and does not appear short of breath  Cardiovascular: No lower extremity edema, non tender, no erythema  Skin: Warm dry intact with no signs of infection or rash on extremities or on axial skeleton.  Abdomen: Soft nontender  Neuro: Cranial nerves II through XII are intact, neurovascularly intact in all extremities with 2+ DTRs and 2+ pulses.  Lymph: No lymphadenopathy of posterior or anterior cervical chain or axillae bilaterally.  Gait antalgic gait which is new MSK:  Non tender with full range of motion and good stability and symmetric strength and tone of shoulders, elbows, wrist, hips bilaterally.  Knee: Bilateral Valgus deformity of the knees notedpatient does have a significant thigh to calf ratio. Moderate to severe tenderness of the medial joint line bilaterally No decreased lacking the last 10 of flexion which is worse than previous exam Instability of the knees bilaterally with valgus force Ligaments with solid consistent endpoints including ACL, PCL, LCL, MCL. She  does have  some instability with valgus stress Negative Mcmurray's, Apley's, and Thessalonian tests. Patellar glide moderate crepitus. Patellar and quadriceps tendons unremarkable. Hamstring and quadriceps strength is normal.  Moderateworsening from previous exam  After informed written and verbal consent, patient was seated on exam table. Right knee was prepped with alcohol swab and utilizing anterolateral approach, patient's right knee space was injected with 4:1  marcaine 0.5%: Kenalog 40mg /dL. Patient tolerated the  procedure well without immediate complications.  After informed written and verbal consent, patient was seated on exam table. Left knee was prepped with alcohol swab and utilizing anterolateral approach, patient's left knee space was injected with 4:1  marcaine 0.5%: Kenalog 40mg /dL. Patient tolerated the procedure well without immediate complications.    Impression and Recommendations:     This case required medical decision making of moderate complexity.

## 2016-01-08 NOTE — Assessment & Plan Note (Signed)
Bilateral injections given today. Tolerated the procedure well. We discussed that patient could be a candidate for viscous supplementation. Encourage her to continue to conservative therapy otherwise. Has failed all other conservative therapy otherwise. Patient does not want any surgical intervention. Patient will come back and see me again in 3-4 weeks for further evaluation.

## 2016-01-13 ENCOUNTER — Other Ambulatory Visit: Payer: Self-pay | Admitting: *Deleted

## 2016-01-13 MED ORDER — ESOMEPRAZOLE MAGNESIUM 40 MG PO CPDR: 40.0000 mg | DELAYED_RELEASE_CAPSULE | Freq: Every day | ORAL | Status: DC

## 2016-01-23 ENCOUNTER — Ambulatory Visit (INDEPENDENT_AMBULATORY_CARE_PROVIDER_SITE_OTHER)
Admission: RE | Admit: 2016-01-23 | Discharge: 2016-01-23 | Disposition: A | Discharge status: Home / Self Care | Payer: Commercial Managed Care - HMO | Source: Ambulatory Visit | Attending: Internal Medicine | Admitting: Internal Medicine

## 2016-01-23 ENCOUNTER — Other Ambulatory Visit (INDEPENDENT_AMBULATORY_CARE_PROVIDER_SITE_OTHER): Payer: Commercial Managed Care - HMO

## 2016-01-23 DIAGNOSIS — D509 Iron deficiency anemia, unspecified: Secondary | ICD-10-CM | POA: Diagnosis not present

## 2016-01-23 DIAGNOSIS — E2839 Other primary ovarian failure: Secondary | ICD-10-CM

## 2016-01-23 LAB — CBC WITH DIFFERENTIAL/PLATELET
BASOS ABS: 0.2 10*3/uL — AB (ref 0.0–0.1)
Basophils Relative: 2.3 % (ref 0.0–3.0)
EOS ABS: 0.4 10*3/uL (ref 0.0–0.7)
Eosinophils Relative: 4.5 % (ref 0.0–5.0)
HEMATOCRIT: 39 % (ref 36.0–46.0)
HEMOGLOBIN: 13.3 g/dL (ref 12.0–15.0)
LYMPHS PCT: 31.5 % (ref 12.0–46.0)
Lymphs Abs: 2.6 10*3/uL (ref 0.7–4.0)
MCHC: 34.1 g/dL (ref 30.0–36.0)
MCV: 88.3 fl (ref 78.0–100.0)
MONOS PCT: 6.4 % (ref 3.0–12.0)
Monocytes Absolute: 0.5 10*3/uL (ref 0.1–1.0)
Neutro Abs: 4.6 10*3/uL (ref 1.4–7.7)
Neutrophils Relative %: 55.3 % (ref 43.0–77.0)
Platelets: 302 10*3/uL (ref 150.0–400.0)
RBC: 4.42 Mil/uL (ref 3.87–5.11)
RDW: 13.5 % (ref 11.5–15.5)
WBC: 8.3 10*3/uL (ref 4.0–10.5)

## 2016-01-23 LAB — IBC PANEL
Iron: 60 ug/dL (ref 42–145)
Saturation Ratios: 15.1 % — ABNORMAL LOW (ref 20.0–50.0)
Transferrin: 283 mg/dL (ref 212.0–360.0)

## 2016-01-23 LAB — FERRITIN: Ferritin: 54.3 ng/mL (ref 10.0–291.0)

## 2016-01-24 ENCOUNTER — Other Ambulatory Visit: Payer: Self-pay

## 2016-01-24 DIAGNOSIS — D509 Iron deficiency anemia, unspecified: Secondary | ICD-10-CM

## 2016-01-27 ENCOUNTER — Telehealth: Payer: Self-pay | Admitting: Family Medicine

## 2016-01-27 NOTE — Telephone Encounter (Signed)
Spoke with pt, scheduled her for tomorrow at 230pm.

## 2016-01-27 NOTE — Telephone Encounter (Signed)
Patient has appointment on the 19th for a follow up.  Patient states she is still having issues with knees and would like to come in before then. Please follow up with patient.

## 2016-01-28 ENCOUNTER — Encounter: Payer: Self-pay | Admitting: Family Medicine

## 2016-01-28 ENCOUNTER — Ambulatory Visit (INDEPENDENT_AMBULATORY_CARE_PROVIDER_SITE_OTHER): Payer: Commercial Managed Care - HMO | Admitting: Family Medicine

## 2016-01-28 VITALS — BP 118/82 | HR 62 | Ht 63.0 in | Wt 210.0 lb

## 2016-01-28 DIAGNOSIS — M1711 Unilateral primary osteoarthritis, right knee: Secondary | ICD-10-CM | POA: Diagnosis not present

## 2016-01-28 NOTE — Progress Notes (Signed)
[name] [address], [place] [address] Phone: [phone] Subjective:      CC: Bilateral knee pain follow up   [id number] [name] is a 70 y.o. female coming in with complaint of  bilateral knee pain. 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  Patient was seen 3 weeks ago and was given corticosteroid injections in the knees bilaterally. Patient's last one in the left side did help some but unfortunately the right side has not helped at all. Having worsening symptoms at this time. Discussed the pain as a dull, throbbing aching sensation. States that it is significant instability. Seem worse than before. Still avoid any type of surgical intervention.  Past Medical History  Diagnosis Date  . HTN (hypertension)   . Hyperlipidemia   . Obesity   . Osteoarthritis   . DJD (degenerative joint disease), lumbar   . Chronic headaches   . GERD (gastroesophageal reflux disease)   . Allergic rhinitis, cause unspecified   . Hiatal hernia   . Hemorrhoids   . Diverticulosis   . Tubular adenoma of colon   . Anemia   . Allergy     SEASONAL   Past Surgical History  Procedure Laterality Date  . Tubal ligation    . Colonoscopy    . Ablation     Social History  Substance Use Topics  . Smoking status: Never Smoker   . Smokeless tobacco: Never Used  . Alcohol Use: No   Allergies  Allergen Reactions  . Soybean-Containing Drug Products     Allergy testing positive for soy beans but pt uses soy sauce  . Sulfonamide Derivatives    Family History  Problem Relation Age of Onset  . Heart disease Mother   . Hypertension Mother   . Diabetes Brother   . Colon cancer Neg Hx   . Esophageal cancer Neg Hx   . Rectal cancer Neg Hx   . Stomach cancer Neg Hx        Past medical history, social, surgical and family history all reviewed and no pertinent  info pertaining to chief complaint. All other was reviewed using the EMR.    Review of Systems: No headache, visual changes, nausea, vomiting, diarrhea, constipation, dizziness, abdominal pain, skin rash, fevers, chills, night sweats, weight loss, swollen lymph nodes, body aches, joint swelling, muscle aches, chest pain, shortness of breath, mood changes.   Objective Blood pressure 118/82, pulse 62, height 5\' 3"  (1.6 m), weight 210 lb (95.255 kg), SpO2 98 %.  General: No apparent distress alert and oriented x3 mood and affect normal, dressed appropriately. Overweight HEENT: Pupils equal, extraocular movements intact  Respiratory: Patient's speak in full sentences and does not appear short of breath  Cardiovascular: No lower extremity edema, non tender, no erythema  Skin: Warm dry intact with no signs of infection or rash on extremities or on axial skeleton.  Abdomen: Soft nontender  Neuro: Cranial nerves II through XII are intact, neurovascularly intact in all extremities with 2+ DTRs and 2+ pulses.  Lymph: No lymphadenopathy of posterior or anterior cervical chain or axillae bilaterally.  Gait antalgic gait which is new MSK:  Non tender with full range of motion and good stability and symmetric strength and tone of shoulders, elbows, wrist, hips bilaterally.  Knee: Bilateral Valgus deformity of the knees notedpatient does have a significant thigh to calf ratio. Severe  tenderness of the right knee on the medial aspect. Mild tenderness on the left knee. Instability of the knees bilaterally with valgus force Ligaments with solid consistent endpoints including ACL, PCL, LCL, MCL. She  does have some instability with valgus stress Negative Mcmurray's, Apley's, and Thessalonian tests. Patellar glide moderate crepitus. Patellar and quadriceps tendons unremarkable. Hamstring and quadriceps strength is normal.  Worsen exam on the right side  After informed written and verbal consent, patient was  seated on exam table. Right knee was prepped with alcohol swab and utilizing anterolateral approach, patient's right knee space was injected with .hyloronice acid 22mg .ml of monovisc. Patient tolerated the procedure well without immediate complications.       Impression and Recommendations:     This case required medical decision making of moderate complexity.

## 2016-01-28 NOTE — Progress Notes (Signed)
Pre visit review using our clinic review tool, if applicable. No additional management support is needed unless otherwise documented below in the visit note. 

## 2016-01-28 NOTE — Patient Instructions (Signed)
Good to see you  Ice 20 minutes 2 times daily. Usually after activity and before bed. Stay active Tried the monovisc in the right knee.  Keep appointment and I will see you for the left if it is worse.

## 2016-01-28 NOTE — Assessment & Plan Note (Addendum)
Patient has failed all other conservative therapy at this point. Patient started on viscous supplementation. I'm hoping that patient does respond well. Patient has any worsening symptoms I do think that surgical intervention will be necessary. Patient will come back and see me again in 2-3 weeks for further evaluation and treatment. Negative worsening symptoms on the contralateral side will try that side. Spent  25 minutes with patient face-to-face and had greater than 50% of counseling including as described above in assessment and plan.

## 2016-01-29 ENCOUNTER — Other Ambulatory Visit: Payer: Self-pay | Admitting: Internal Medicine

## 2016-01-30 ENCOUNTER — Telehealth: Payer: Self-pay | Admitting: Internal Medicine

## 2016-01-30 ENCOUNTER — Encounter (HOSPITAL_COMMUNITY): Payer: Self-pay | Admitting: Emergency Medicine

## 2016-01-30 ENCOUNTER — Ambulatory Visit (HOSPITAL_COMMUNITY)
Admission: EM | Admit: 2016-01-30 | Discharge: 2016-01-30 | Disposition: A | Discharge status: Home / Self Care | Payer: Commercial Managed Care - HMO | Attending: Family Medicine | Admitting: Family Medicine

## 2016-01-30 DIAGNOSIS — H5940 Inflammation (infection) of postprocedural bleb, unspecified: Secondary | ICD-10-CM | POA: Diagnosis not present

## 2016-01-30 NOTE — Telephone Encounter (Signed)
Patient Name: [name]  DOB: [birth year]    Initial Comment caller states she has a rash on her arm where she got shingles shot   Nurse Assessment  Nurse: Mallie Mussel, RN, Alveta Heimlich Date/Time Eilene Ghazi Time): 01/30/2016 12:56:20 PM  Confirm and document reason for call. If symptomatic, describe symptoms. You must click the next button to save text entered. ---Caller states that she received a shingles shot last Thursday. She began to notice a rash on her right arm last night. The rash is itchy. The skin in the area of the rash feels warm, but she denies fever. She describes the rash as red raised bumps.  Has the patient traveled out of the country within the last 30 days? ---No  Does the patient have any new or worsening symptoms? ---Yes  Will a triage be completed? ---Yes  Related visit to physician within the last 2 weeks? ---Yes  Does the PT have any chronic conditions? (i.e. diabetes, asthma, etc.) ---Yes  List chronic conditions. ---HTN, Hypercholesterolemia  Is this a behavioral health or substance abuse call? ---No     Guidelines    Guideline Title Affirmed Question Affirmed Notes  Immunization Reactions [1] Redness or red streak around the injection site AND [2] begins > 48 hours after shot AND [3] no fever (Exception: red area < 1 inch or 2.5 cm wide)    Final Disposition User   See Physician within [place], RN, Alveta Heimlich    Comments  the redness around the injection site began after 48 hours of the injection.  No appointments remaining at [facility] today. Dr. [name] had some later times available earlier today, but they are all booked up. I went over the information to the UC listed.   Referrals  REFERRED TO PCP OFFICE   Disagree/Comply: Comply

## 2016-01-30 NOTE — Telephone Encounter (Signed)
Pt advised. She was already being seen at [facility] at the time of the call

## 2016-01-30 NOTE — Telephone Encounter (Signed)
Ok for benadryl 50 mg every 6 hrs as needed for itch or rash or sob  OK for appt next available

## 2016-01-30 NOTE — Discharge Instructions (Signed)
Apply today cold compress   Tomorrow start heat  Benadryl for itch  Follow up with your doctor as needed.

## 2016-01-30 NOTE — ED Notes (Addendum)
Here with possible allergic reaction or inflammatory response post Shingles vaccine right arm Vaccine given from [facility] last week Itchy area, warm to touch and inflammation Achy pain noted, doctor instructed to come here

## 2016-02-01 NOTE — ED Provider Notes (Signed)
CSN: [id number]     Arrival date & time 01/30/16  1422 History   First MD Initiated Contact with Patient 01/30/16 1612     Chief Complaint  Patient presents with  . Allergic Reaction   (Consider location/radiation/quality/duration/timing/severity/associated sxs/prior Treatment) HPI Pt is here with redness and itch to right upper arm after administration of shingles vaccine. No other complaints. No pain score, there is some itch, no home treatment.  Past Medical History  Diagnosis Date  . HTN (hypertension)   . Hyperlipidemia   . Obesity   . Osteoarthritis   . DJD (degenerative joint disease), lumbar   . Chronic headaches   . GERD (gastroesophageal reflux disease)   . Allergic rhinitis, cause unspecified   . Hiatal hernia   . Hemorrhoids   . Diverticulosis   . Tubular adenoma of colon   . Anemia   . Allergy     SEASONAL   Past Surgical History  Procedure Laterality Date  . Tubal ligation    . Colonoscopy    . Ablation     Family History  Problem Relation Age of Onset  . Heart disease Mother   . Hypertension Mother   . Diabetes Brother   . Colon cancer Neg Hx   . Esophageal cancer Neg Hx   . Rectal cancer Neg Hx   . Stomach cancer Neg Hx    Social History  Substance Use Topics  . Smoking status: Never Smoker   . Smokeless tobacco: Never Used  . Alcohol Use: No   OB History    No data available     Review of Systems Red area right arm Allergies  Soybean-containing drug products and Sulfonamide derivatives  Home Medications   Prior to Admission medications   Medication Sig Start Date End Date Taking? Authorizing Provider  amoxicillin-clavulanate (AUGMENTIN) 875-125 MG tablet Take 1 tablet by mouth 2 (two) times daily. 10/25/15   Deneise Lever, MD  atorvastatin (LIPITOR) 20 MG tablet TAKE 1 TABLET EVERY DAY 12/31/15   Biagio Borg, MD  azelastine (ASTELIN) 0.1 % nasal spray Place 1-2 sprays into both nostrils at bedtime. 05/17/15   Deneise Lever, MD   azithromycin (ZITHROMAX Z-PAK) 250 MG tablet Take as directed 12/17/15   Deneise Lever, MD  cefdinir (OMNICEF) 300 MG capsule Take 1 capsule (300 mg total) by mouth 2 (two) times daily. 10/17/15   Deneise Lever, MD  cetirizine (ZYRTEC) 10 MG tablet Take 1 tablet (10 mg total) by mouth daily. As needed 04/24/13   Biagio Borg, MD  Cholecalciferol (VITAMIN D3) 2000 UNITS capsule Take 2,000 Units by mouth daily.    Historical Provider, MD  diltiazem [facility]) 120 MG 24 hr capsule TAKE 1 CAPSULE EVERY DAY 12/31/15   Biagio Borg, MD  esomeprazole (NEXIUM) 40 MG capsule Take 1 capsule (40 mg total) by mouth daily. 01/13/16   Jerene Bears, MD  ferrous sulfate 325 (65 FE) MG tablet Take 1 tablet (325 mg total) by mouth daily with breakfast. 11/14/14   Jerene Bears, MD  losartan-hydrochlorothiazide (HYZAAR) 100-25 MG tablet TAKE 1 TABLET EVERY DAY (YEARLY PHYSICAL IS DUE MUST SEE MD FOR FUTURE REFILLS) 01/29/16   Biagio Borg, MD  meloxicam (MOBIC) 15 MG tablet Take 1 tablet (15 mg total) by mouth daily. 10/24/15   Lyndal Pulley, DO  metoprolol (LOPRESSOR) 50 MG tablet TAKE 1 TABLET TWICE DAILY (YEARLY PHYSICAL IS DUE. MUST SEE DOCTOR FOR FURTHER REFILLS) 01/29/16  Biagio Borg, MD  sertraline (ZOLOFT) 50 MG tablet Take 1 tablet (50 mg total) by mouth daily. 03/27/15   Biagio Borg, MD  sucralfate (CARAFATE) 1 G tablet Take 1 g by mouth as needed. Reported on 12/20/2015    Historical Provider, MD  traMADol Veatrice Bourbon) 50 MG tablet 1-2 tabs by mouth every 6 hours as needed for pain 07/03/15   Lyndal Pulley, DO  triamcinolone (NASACORT AQ) 55 MCG/ACT AERO nasal inhaler Place 2 sprays into the nose daily. 05/17/15   Deneise Lever, MD  triamcinolone cream (KENALOG) 0.1 % Apply topically as needed. 01/07/16   Biagio Borg, MD  Turmeric 500 MG CAPS Take 1 capsule by mouth daily.    Historical Provider, MD   Meds Ordered and Administered this Visit  Medications - No data to display  BP 135/67 mmHg  Pulse 57   Temp(Src) 97.8 F (36.6 C) (Oral)  SpO2 96% No data found.   Physical Exam NURSES NOTES AND VITAL SIGNS REVIEWED. CONSTITUTIONAL: Well developed, well nourished, no acute distress HEENT: normocephalic, atraumatic EYES: Conjunctiva normal NECK:normal ROM, supple, no adenopathy PULMONARY:No respiratory distress, normal effort MUSCULOSKELETAL: Normal ROM of all extremities, Right deltoid, 2 cm diameter red warm area around injection site. Not tender to palpatiion. SKIN: warm and dry without rash PSYCHIATRIC: Mood and affect, behavior are normal  ED Course  Procedures (including critical care time)  Labs Review Labs Reviewed - No data to display  Imaging Review No results found.   Visual Acuity Review  Right Eye Distance:   Left Eye Distance:   Bilateral Distance:    Right Eye Near:   Left Eye Near:    Bilateral Near:      Pt is reassured she has a local inflammatory reaction to the shingles injection. Treat symptomatically.   MDM   1. Inflammation (infection) of postprocedural bleb     Patient is reassured that there are no issues that require transfer to higher level of care at this time or additional tests. Patient is advised to continue home symptomatic treatment. Patient is advised that if there are new or worsening symptoms to attend the emergency department, contact primary care provider, or return to UC. Instructions of care provided discharged home in stable condition.    THIS NOTE WAS GENERATED USING A VOICE RECOGNITION SOFTWARE PROGRAM. ALL REASONABLE EFFORTS  WERE MADE TO PROOFREAD THIS DOCUMENT FOR ACCURACY.  I have verbally reviewed the discharge instructions with the patient. A printed AVS was given to the patient.  All questions were answered prior to discharge.      Konrad Felix, [place] 02/01/16 1314

## 2016-02-05 ENCOUNTER — Ambulatory Visit (INDEPENDENT_AMBULATORY_CARE_PROVIDER_SITE_OTHER): Payer: Commercial Managed Care - HMO | Admitting: Family Medicine

## 2016-02-05 ENCOUNTER — Encounter: Payer: Self-pay | Admitting: Family Medicine

## 2016-02-05 VITALS — BP 124/82 | HR 61 | Ht 63.0 in | Wt 212.0 lb

## 2016-02-05 DIAGNOSIS — M1710 Unilateral primary osteoarthritis, unspecified knee: Secondary | ICD-10-CM

## 2016-02-05 DIAGNOSIS — M1712 Unilateral primary osteoarthritis, left knee: Secondary | ICD-10-CM

## 2016-02-05 NOTE — Assessment & Plan Note (Addendum)
Patient was given viscous supplementation today after multiple different choices. Patient elected do this and set of possible bracing or formal physical therapy. Patient will continue with the icing. We do encourage patient to potentially do formal physical therapy and patient will consider. Patient encouraged to weight loss. Patient will come back and see me again in 2-3 months for further evaluation. Patient also can repeat these injections every 6 months. Did have good results with resolution of pain on the right side previously.  Spent  25 minutes with patient face-to-face and had greater than 50% of counseling including as described above in assessment and plan.

## 2016-02-05 NOTE — Patient Instructions (Addendum)
Good to see you  [name] is your friend pennsaid pinkie amount topically 2 times daily as needed.  Tylenol 500mg  3 times a day scheduled If in a lot of pain then take the tramadol I hope this helps and can repeat every 6 months,  See me again in 2-3 months.

## 2016-02-05 NOTE — Progress Notes (Signed)
[name] [place],  [address] Phone: [phone] Subjective:      CC: Bilateral knee pain follow up   [id number] [name] is a 70 y.o. female coming in with complaint of  bilateral knee pain. 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  Patient did have viscous supple mentation on her right knee one month ago. Patient was to continue to do conservative therapy. Patient was continuing icing. Patient states right knee is feeling approximately 70% better. States that it is not locking on her as much. Not as much instability. Worsening pain on the left side. Did not respond to the steroid injections previously.  Past Medical History  Diagnosis Date  . HTN (hypertension)   . Hyperlipidemia   . Obesity   . Osteoarthritis   . DJD (degenerative joint disease), lumbar   . Chronic headaches   . GERD (gastroesophageal reflux disease)   . Allergic rhinitis, cause unspecified   . Hiatal hernia   . Hemorrhoids   . Diverticulosis   . Tubular adenoma of colon   . Anemia   . Allergy     SEASONAL   Past Surgical History  Procedure Laterality Date  . Tubal ligation    . Colonoscopy    . Ablation     Social History  Substance Use Topics  . Smoking status: Never Smoker   . Smokeless tobacco: Never Used  . Alcohol Use: No   Allergies  Allergen Reactions  . Soybean-Containing Drug Products     Allergy testing positive for soy beans but pt uses soy sauce  . Sulfonamide Derivatives    Family History  Problem Relation Age of Onset  . Heart disease Mother   . Hypertension Mother   . Diabetes Brother   . Colon cancer Neg Hx   . Esophageal cancer Neg Hx   . Rectal cancer Neg Hx   . Stomach cancer Neg Hx        Past medical history, social, surgical and family history all reviewed and no pertinent info pertaining to chief complaint.  All other was reviewed using the EMR.    Review of Systems: No headache, visual changes, nausea, vomiting, diarrhea, constipation, dizziness, abdominal pain, skin rash, fevers, chills, night sweats, weight loss, swollen lymph nodes, body aches, joint swelling, muscle aches, chest pain, shortness of breath, mood changes.   Objective There were no vitals taken for this visit.  General: No apparent distress alert and oriented x3 mood and affect normal, dressed appropriately. Overweight HEENT: Pupils equal, extraocular movements intact  Respiratory: Patient's speak in full sentences and does not appear short of breath  Cardiovascular: No lower extremity edema, non tender, no erythema  Skin: Warm dry intact with no signs of infection or rash on extremities or on axial skeleton.  Abdomen: Soft nontender  Neuro: Cranial nerves II through XII are intact, neurovascularly intact in all extremities with 2+ DTRs and 2+ pulses.  Lymph: No lymphadenopathy of posterior or anterior cervical chain or axillae bilaterally.  Gait antalgic gait which is new MSK:  Non tender with full range of motion and good stability and symmetric strength and tone of shoulders, elbows, wrist, hips bilaterally.  Knee: Bilateral Valgus deformity of the knees notedpatient does have a significant thigh to calf ratio. Right knee and mild tenderness over the medial aspect but severe tenderness over the left knee  Instability of the knees bilaterally with valgus force Ligaments with solid consistent endpoints including ACL, PCL, LCL, MCL. She  does have some instability with valgus stress Negative Mcmurray's, Apley's, and Thessalonian tests. Patellar glide moderate crepitus. Patellar and quadriceps tendons unremarkable. Hamstring and quadriceps strength is normal.  Worsening exam on the left side  After informed written and verbal consent, patient was seated on exam table. leftknee was prepped with alcohol swab and utilizing  anterolateral approach, patient's leftknee space was injected with .hyaluronic acid 22mg .ml of monovisc. Patient tolerated the procedure well without immediate complications.       Impression and Recommendations:     This case required medical decision making of moderate complexity.

## 2016-02-05 NOTE — Progress Notes (Signed)
Pre visit review using our clinic review tool, if applicable. No additional management support is needed unless otherwise documented below in the visit note. 

## 2016-02-17 ENCOUNTER — Telehealth: Payer: Self-pay | Admitting: Internal Medicine

## 2016-02-17 MED ORDER — ESOMEPRAZOLE MAGNESIUM 40 MG PO CPDR: 40.0000 mg | DELAYED_RELEASE_CAPSULE | Freq: Every day | ORAL | Status: DC

## 2016-02-17 NOTE — Telephone Encounter (Signed)
Rx sent. Patient advised. 

## 2016-02-24 ENCOUNTER — Telehealth: Payer: Self-pay | Admitting: *Deleted

## 2016-02-24 NOTE — Telephone Encounter (Signed)
Left msg on triage she been having some back pain going down her leg. Wanting md to rx something. Called pt back inform her b4MD can rx something she will need to make appt to be evaluated. Pt made appt for 02/27/16 @ 1:45...Johny Chess

## 2016-02-27 ENCOUNTER — Ambulatory Visit (INDEPENDENT_AMBULATORY_CARE_PROVIDER_SITE_OTHER): Payer: Commercial Managed Care - HMO | Admitting: Internal Medicine

## 2016-02-27 ENCOUNTER — Encounter: Payer: Self-pay | Admitting: Internal Medicine

## 2016-02-27 VITALS — BP 124/68 | HR 70 | Temp 98.0°F | Resp 20 | Wt 212.0 lb

## 2016-02-27 DIAGNOSIS — M5416 Radiculopathy, lumbar region: Secondary | ICD-10-CM

## 2016-02-27 DIAGNOSIS — I10 Essential (primary) hypertension: Secondary | ICD-10-CM | POA: Diagnosis not present

## 2016-02-27 DIAGNOSIS — M5417 Radiculopathy, lumbosacral region: Secondary | ICD-10-CM | POA: Diagnosis not present

## 2016-02-27 DIAGNOSIS — R350 Frequency of micturition: Secondary | ICD-10-CM | POA: Diagnosis not present

## 2016-02-27 MED ORDER — CEPHALEXIN 500 MG PO CAPS: 500.0000 mg | ORAL_CAPSULE | Freq: Four times a day (QID) | ORAL | Status: DC

## 2016-02-27 MED ORDER — PREDNISONE 10 MG PO TABS: ORAL_TABLET | ORAL | Status: DC

## 2016-02-27 MED ORDER — HYDROCODONE-ACETAMINOPHEN 5-325 MG PO TABS: 1.0000 | ORAL_TABLET | Freq: Four times a day (QID) | ORAL | Status: DC | PRN

## 2016-02-27 NOTE — Assessment & Plan Note (Signed)
Recurrent infreq but more severe this time per pt, exam benign except for paravertebral tender, tramadol dnot working, for hydrocodone prn, predpac asd,  to f/u any worsening symptoms or concerns, consider imaging for persistent pain or worsening neuro exam

## 2016-02-27 NOTE — Patient Instructions (Signed)
Please take all new medication as prescribed- the antibiotic, as well as the pain medication and prednisone  Please continue all other medications as before, and refills have been done if requested.  Please have the pharmacy call with any other refills you may need.  Please continue your efforts at being more active, low cholesterol diet, and weight control.  Please keep your appointments with your specialists as you may have planned  Please go to the LAB in the Basement (turn left off the elevator) for the tests to be done today - just the urine testing today  You will be contacted by phone if any changes need to be made immediately.  Otherwise, you will receive a letter about your results with an explanation, but please check with MyChart first. Please remember to sign up for MyChart if you have not done so, as this will be important to you in the future with finding out test results, communicating by private email, and scheduling acute appointments online when needed.

## 2016-02-27 NOTE — Assessment & Plan Note (Signed)
Exam c/w possible UTI  - for empiric cephalexin, check urine cx,  to f/u any worsening symptoms or concerns

## 2016-02-27 NOTE — Progress Notes (Signed)
Pre visit review using our clinic review tool, if applicable. No additional management support is needed unless otherwise documented below in the visit note. 

## 2016-02-27 NOTE — Progress Notes (Signed)
Subjective:    Patient ID: [name], female    DOB: [birth year], 70 y.o.   MRN: [id number]  HPI  Here with 4 days onset right lower back pain, dull and sharp, started mild now mod to severe, constant persistent, non positional, nonexertional, but does seem to radiate to the right mid thigh and buttocks, but Pt denies new neurological symptoms such as new headache, or facial or extremity weakness or numbness.  Pt denies chest pain, increased sob or doe, wheezing, orthopnea, PND, increased LE swelling, palpitations, dizziness or syncope.  Also c/o urinary symptoms frequency x 3 days,  But no dysuria,  urgency, flank pain, hematuria or n/v, fever, chills.    Has hx of right radiculitis, only lasted a few days last time.  Last imaging is 2009 LS spine films with signficant DJD.  No falls or recent wt loss, or reduced appetite Past Medical History  Diagnosis Date  . HTN (hypertension)   . Hyperlipidemia   . Obesity   . Osteoarthritis   . DJD (degenerative joint disease), lumbar   . Chronic headaches   . GERD (gastroesophageal reflux disease)   . Allergic rhinitis, cause unspecified   . Hiatal hernia   . Hemorrhoids   . Diverticulosis   . Tubular adenoma of colon   . Anemia   . Allergy     SEASONAL   Past Surgical History  Procedure Laterality Date  . Tubal ligation    . Colonoscopy    . Ablation      reports that she has never smoked. She has never used smokeless tobacco. She reports that she does not drink alcohol or use illicit drugs. family history includes Diabetes in her brother; Heart disease in her mother; Hypertension in her mother. There is no history of Colon cancer, Esophageal cancer, Rectal cancer, or Stomach cancer. Allergies  Allergen Reactions  . Soybean-Containing Drug Products     Allergy testing positive for soy beans but pt uses soy sauce  . Sulfonamide Derivatives    Current Outpatient Prescriptions on File Prior to Visit  Medication Sig Dispense Refill  .  atorvastatin (LIPITOR) 20 MG tablet TAKE 1 TABLET EVERY DAY 90 tablet 2  . azelastine (ASTELIN) 0.1 % nasal spray Place 1-2 sprays into both nostrils at bedtime. 30 mL 5  . azithromycin (ZITHROMAX Z-PAK) 250 MG tablet Take as directed 6 each 0  . cetirizine (ZYRTEC) 10 MG tablet Take 1 tablet (10 mg total) by mouth daily. As needed 90 tablet 3  . Cholecalciferol (VITAMIN D3) 2000 UNITS capsule Take 2,000 Units by mouth daily.    Marland Kitchen diltiazem (TIAZAC) 120 MG 24 hr capsule TAKE 1 CAPSULE EVERY DAY 90 capsule 2  . esomeprazole (NEXIUM) 40 MG capsule Take 1 capsule (40 mg total) by mouth daily. 30 capsule 3  . ferrous sulfate 325 (65 FE) MG tablet Take 1 tablet (325 mg total) by mouth daily with breakfast. 30 tablet 2  . losartan-hydrochlorothiazide (HYZAAR) 100-25 MG tablet TAKE 1 TABLET EVERY DAY (YEARLY PHYSICAL IS DUE MUST SEE MD FOR FUTURE REFILLS) 90 tablet 0  . meloxicam (MOBIC) 15 MG tablet Take 1 tablet (15 mg total) by mouth daily. 90 tablet 1  . metoprolol (LOPRESSOR) 50 MG tablet TAKE 1 TABLET TWICE DAILY (YEARLY PHYSICAL IS DUE. MUST SEE DOCTOR FOR FURTHER REFILLS) 180 tablet 0  . sertraline (ZOLOFT) 50 MG tablet Take 1 tablet (50 mg total) by mouth daily. 90 tablet 2  . sucralfate (CARAFATE) 1 G  tablet Take 1 g by mouth as needed. Reported on 12/20/2015    . traMADol (ULTRAM) 50 MG tablet 1-2 tabs by mouth every 6 hours as needed for pain 120 tablet 5  . triamcinolone (NASACORT AQ) 55 MCG/ACT AERO nasal inhaler Place 2 sprays into the nose daily. 1 Inhaler 12  . triamcinolone cream (KENALOG) 0.1 % Apply topically as needed. 30 g 0  . Turmeric 500 MG CAPS Take 1 capsule by mouth daily.     No current facility-administered medications on file prior to visit.   Review of Systems  Constitutional: Negative for unusual diaphoresis or night sweats HENT: Negative for ear swelling or discharge Eyes: Negative for worsening visual haziness  Respiratory: Negative for choking and stridor.     Gastrointestinal: Negative for distension or worsening eructation Genitourinary: Negative for retention or change in urine volume.  Musculoskeletal: Negative for other MSK pain or swelling Skin: Negative for color change and worsening wound Neurological: Negative for tremors and numbness other than noted  Psychiatric/Behavioral: Negative for decreased concentration or agitation other than above        Objective:   Physical Exam BP 124/68 mmHg  Pulse 70  Temp(Src) 98 F (36.7 C) (Oral)  Resp 20  Wt 212 lb (96.163 kg)  SpO2 93% VS noted,  Constitutional: Pt appears in no apparent distress HENT: Head: NCAT.  Right Ear: External ear normal.  Left Ear: External ear normal.  Eyes: . Pupils are equal, round, and reactive to light. Conjunctivae and EOM are normal Neck: Normal range of motion. Neck supple.  Cardiovascular: Normal rate and regular rhythm.   Pulmonary/Chest: Effort normal and breath sounds without rales or wheezing.  Abd:  Soft, ND, + BS but has mild low mid abd tender, slightly more to left of midline, no guarding or rebound,  Spine with palpable tender without obvious spasm, rash or swelling to right lower lumbar paravertebral Neurological: Pt is alert. Not confused , motor 5/5 intact, sens /dtr intact  Skin: Skin is warm. No rash, no LE edema Psychiatric: Pt behavior is normal. No agitation.     Assessment & Plan:

## 2016-02-27 NOTE — Assessment & Plan Note (Signed)
stable overall by history and exam, recent data reviewed with pt, and pt to continue medical treatment as before,  to f/u any worsening symptoms or concerns BP Readings from Last 3 Encounters:  02/27/16 124/68  02/05/16 124/82  01/30/16 135/67

## 2016-03-05 ENCOUNTER — Ambulatory Visit (INDEPENDENT_AMBULATORY_CARE_PROVIDER_SITE_OTHER): Payer: Commercial Managed Care - HMO | Admitting: Family

## 2016-03-05 ENCOUNTER — Encounter: Payer: Self-pay | Admitting: Family

## 2016-03-05 VITALS — BP 110/70 | HR 57 | Temp 97.6°F | Ht 63.0 in | Wt 211.5 lb

## 2016-03-05 DIAGNOSIS — S46819A Strain of other muscles, fascia and tendons at shoulder and upper arm level, unspecified arm, initial encounter: Secondary | ICD-10-CM | POA: Diagnosis not present

## 2016-03-05 NOTE — Progress Notes (Signed)
Subjective:    Patient ID: [name], female    DOB: [birth year], 70 y.o.   MRN: [id number]   [name] is a 70 y.o. female who presents today for an acute visit.    HPI Comments: Patient here for evaluation of low back pain after fall off of 4 steps yesterday. Sore and aching.  Didn't hit head. Describes the incident when coming back into the house, she missed a step , 'tripped'. Pain in bilateral lumbar. Notes pain is also in bilateral hips. Denies numbness, tingling radiating down legs. Taking hydrocodone with relief. Heat helps as well .   Normal bone density 12/2015.   XR Lumbar 2009 shows arthropathy and DDD.   Past Medical History  Diagnosis Date  . HTN (hypertension)   . Hyperlipidemia   . Obesity   . Osteoarthritis   . DJD (degenerative joint disease), lumbar   . Chronic headaches   . GERD (gastroesophageal reflux disease)   . Allergic rhinitis, cause unspecified   . Hiatal hernia   . Hemorrhoids   . Diverticulosis   . Tubular adenoma of colon   . Anemia   . Allergy     SEASONAL   Allergies: Soybean-containing drug products and Sulfonamide derivatives Current Outpatient Prescriptions on File Prior to Visit  Medication Sig Dispense Refill  . atorvastatin (LIPITOR) 20 MG tablet TAKE 1 TABLET EVERY DAY 90 tablet 2  . azelastine (ASTELIN) 0.1 % nasal spray Place 1-2 sprays into both nostrils at bedtime. 30 mL 5  . cephALEXin (KEFLEX) 500 MG capsule Take 1 capsule (500 mg total) by mouth 4 (four) times daily. 40 capsule 0  . cetirizine (ZYRTEC) 10 MG tablet Take 1 tablet (10 mg total) by mouth daily. As needed 90 tablet 3  . Cholecalciferol (VITAMIN D3) 2000 UNITS capsule Take 2,000 Units by mouth daily.    Marland Kitchen diltiazem (TIAZAC) 120 MG 24 hr capsule TAKE 1 CAPSULE EVERY DAY 90 capsule 2  . esomeprazole (NEXIUM) 40 MG capsule Take 1 capsule (40 mg total) by mouth daily. 30 capsule 3  . ferrous sulfate 325 (65 FE) MG tablet Take 1 tablet (325 mg total) by mouth  daily with breakfast. 30 tablet 2  . HYDROcodone-acetaminophen (NORCO/VICODIN) 5-325 MG tablet Take 1 tablet by mouth every 6 (six) hours as needed for moderate pain. 60 tablet 0  . losartan-hydrochlorothiazide (HYZAAR) 100-25 MG tablet TAKE 1 TABLET EVERY DAY (YEARLY PHYSICAL IS DUE MUST SEE MD FOR FUTURE REFILLS) 90 tablet 0  . metoprolol (LOPRESSOR) 50 MG tablet TAKE 1 TABLET TWICE DAILY (YEARLY PHYSICAL IS DUE. MUST SEE DOCTOR FOR FURTHER REFILLS) 180 tablet 0  . predniSONE (DELTASONE) 10 MG tablet 3 tabs by mouth per day for 3 days,2tabs per day for 3 days,1tab per day for 3 days 18 tablet 0  . sertraline (ZOLOFT) 50 MG tablet Take 1 tablet (50 mg total) by mouth daily. 90 tablet 2  . triamcinolone (NASACORT AQ) 55 MCG/ACT AERO nasal inhaler Place 2 sprays into the nose daily. 1 Inhaler 12  . Turmeric 500 MG CAPS Take 1 capsule by mouth daily.    . sucralfate (CARAFATE) 1 G tablet Take 1 g by mouth as needed. Reported on 12/20/2015     No current facility-administered medications on file prior to visit.    Social History  Substance Use Topics  . Smoking status: Never Smoker   . Smokeless tobacco: Never Used  . Alcohol Use: No    Review of Systems  Constitutional:  Negative for fever and chills.  HENT: Negative for congestion.   Cardiovascular: Negative for chest pain and palpitations.  Gastrointestinal: Negative for nausea and vomiting.  Musculoskeletal: Positive for back pain. Negative for myalgias and arthralgias.  Neurological: Negative for headaches.      Objective:    BP 110/70 mmHg  Pulse 57  Temp(Src) 97.6 F (36.4 C) (Oral)  Ht 5\' 3"  (1.6 m)  Wt 211 lb 8 oz (95.936 kg)  BMI 37.48 kg/m2  SpO2 97%   Physical Exam  Constitutional: She appears well-developed and well-nourished.  Eyes: Conjunctivae are normal.  Cardiovascular: Normal rate, regular rhythm, normal heart sounds and normal pulses.   Pulmonary/Chest: Effort normal and breath sounds normal. She has no  wheezes. She has no rhonchi. She has no rales.  Musculoskeletal:       Lumbar back: She exhibits tenderness, pain and spasm. She exhibits normal range of motion, no bony tenderness, no swelling and no laceration.       Back:  Diffuse pain with palpation lumbar spine. Able to perform flexion and extension, lateral side bends however with pain. Negative SLR.  Neurological: She is alert.  Skin: Skin is warm and dry.  Psychiatric: She has a normal mood and affect. Her speech is normal and behavior is normal. Thought content normal.  Vitals reviewed.      Assessment & Plan:   1. Subscapularis (muscle) sprain and strain, unspecified laterality, initial encounter Working diagnosis of muscle sprain from fall yesterday. No radicular symptoms. Pain is diffuse, no pain over vertebrae. Patient and I agreed that with her normal bone density test recently, we would forego imaging with low suspicion of fracture.  We agreed on conservative treatment with over-the-counter ibuprofen and heat. If she does not improve over the next few days, we will order a lumbar x-ray at that point.    I have discontinued [name]'s traMADol, meloxicam, azithromycin, and triamcinolone cream. I am also having her maintain her cetirizine, Turmeric, Vitamin D3, ferrous sulfate, sucralfate, sertraline, triamcinolone, azelastine, diltiazem, atorvastatin, metoprolol, losartan-hydrochlorothiazide, esomeprazole, HYDROcodone-acetaminophen, predniSONE, and cephALEXin.   No orders of the defined types were placed in this encounter.     Start medications as prescribed and explained to patient on After Visit Summary ( AVS). Risks, benefits, and alternatives of the medications and treatment plan prescribed today were discussed, and patient expressed understanding.   Education regarding symptom management and diagnosis given to patient.   Follow-up:Plan follow-up as discussed or as needed if any worsening symptoms or change in  condition.   Continue to follow with [name], MD for routine health maintenance.   Colin Ina and I agreed with plan.   Mable Paris, FNP

## 2016-03-05 NOTE — Patient Instructions (Addendum)
Caution and use VERY sparingly the hydrocodone.   Use ibuprofen over the counter and heat.   If there is no improvement in your symptoms, or if there is any worsening of symptoms, or if you have any additional concerns, please return for re-evaluation; or, if we are closed, consider going to the Emergency Room for evaluation if symptoms urgent.  Low Back Sprain With Rehab A sprain is an injury in which a ligament is torn. The ligaments of the lower back are vulnerable to sprains. However, they are strong and require great force to be injured. These ligaments are important for stabilizing the spinal column. Sprains are classified into three categories. Grade 1 sprains cause pain, but the tendon is not lengthened. Grade 2 sprains include a lengthened ligament, due to the ligament being stretched or partially ruptured. With grade 2 sprains there is still function, although the function may be decreased. Grade 3 sprains involve a complete tear of the tendon or muscle, and function is usually impaired. SYMPTOMS   Severe pain in the lower back.  Sometimes, a feeling of a "pop," "snap," or tear, at the time of injury.  Tenderness and sometimes swelling at the injury site.  Uncommonly, bruising (contusion) within 48 hours of injury.  Muscle spasms in the back. CAUSES  Low back sprains occur when a force is placed on the ligaments that is greater than they can handle. Common causes of injury include:  Performing a stressful act while off-balance.  Repetitive stressful activities that involve movement of the lower back.  Direct hit (trauma) to the lower back. RISK INCREASES WITH:  Contact sports (football, wrestling).  Collisions (major skiing accidents).  Sports that require throwing or lifting (baseball, weightlifting).  Sports involving twisting of the spine (gymnastics, diving, tennis, golf).  Poor strength and flexibility.  Inadequate protection.  Previous back injury or surgery  (especially fusion). PREVENTION  Wear properly fitted and padded protective equipment.  Warm up and stretch properly before activity.  Allow for adequate recovery between workouts.  Maintain physical fitness:  Strength, flexibility, and endurance.  Cardiovascular fitness.  Maintain a healthy body weight. PROGNOSIS  If treated properly, low back sprains usually heal with non-surgical treatment. The length of time for healing depends on the severity of the injury.  RELATED COMPLICATIONS   Recurring symptoms, resulting in a chronic problem.  Chronic inflammation and pain in the low back.  Delayed healing or resolution of symptoms, especially if activity is resumed too soon.  Prolonged impairment.  Unstable or arthritic joints of the low back. TREATMENT  Treatment first involves the use of ice and medicine, to reduce pain and inflammation. The use of strengthening and stretching exercises may help reduce pain with activity. These exercises may be performed at home or with a therapist. Severe injuries may require referral to a therapist for further evaluation and treatment, such as ultrasound. Your caregiver may advise that you wear a back brace or corset, to help reduce pain and discomfort. Often, prolonged bed rest results in greater harm then benefit. Corticosteroid injections may be recommended. However, these should be reserved for the most serious cases. It is important to avoid using your back when lifting objects. At night, sleep on your back on a firm mattress, with a pillow placed under your knees. If non-surgical treatment is unsuccessful, surgery may be needed.  MEDICATION   If pain medicine is needed, nonsteroidal anti-inflammatory medicines (aspirin and ibuprofen), or other minor pain relievers (acetaminophen), are often advised.  Do not  take pain medicine for 7 days before surgery.  Prescription pain relievers may be given, if your caregiver thinks they are needed. Use  only as directed and only as much as you need.  Ointments applied to the skin may be helpful.  Corticosteroid injections may be given by your caregiver. These injections should be reserved for the most serious cases, because they may only be given a certain number of times. HEAT AND COLD  Cold treatment (icing) should be applied for 10 to 15 minutes every 2 to 3 hours for inflammation and pain, and immediately after activity that aggravates your symptoms. Use ice packs or an ice massage.  Heat treatment may be used before performing stretching and strengthening activities prescribed by your caregiver, physical therapist, or athletic trainer. Use a heat pack or a warm water soak. SEEK MEDICAL CARE IF:   Symptoms get worse or do not improve in 2 to 4 weeks, despite treatment.  You develop numbness or weakness in either leg.  You lose bowel or bladder function.  Any of the following occur after surgery: fever, increased pain, swelling, redness, drainage of fluids, or bleeding in the affected area.  New, unexplained symptoms develop. (Drugs used in treatment may produce side effects.) EXERCISES  RANGE OF MOTION (ROM) AND STRETCHING EXERCISES - Low Back Sprain Most people with lower back pain will find that their symptoms get worse with excessive bending forward (flexion) or arching at the lower back (extension). The exercises that will help resolve your symptoms will focus on the opposite motion.  Your physician, physical therapist or athletic trainer will help you determine which exercises will be most helpful to resolve your lower back pain. Do not complete any exercises without first consulting with your caregiver. Discontinue any exercises which make your symptoms worse, until you speak to your caregiver. If you have pain, numbness or tingling which travels down into your buttocks, leg or foot, the goal of the therapy is for these symptoms to move closer to your back and eventually resolve.  Sometimes, these leg symptoms will get better, but your lower back pain may worsen. This is often an indication of progress in your rehabilitation. Be very alert to any changes in your symptoms and the activities in which you participated in the 24 hours prior to the change. Sharing this information with your caregiver will allow him or her to most efficiently treat your condition. These exercises may help you when beginning to rehabilitate your injury. Your symptoms may resolve with or without further involvement from your physician, physical therapist or athletic trainer. While completing these exercises, remember:   Restoring tissue flexibility helps normal motion to return to the joints. This allows healthier, less painful movement and activity.  An effective stretch should be held for at least 30 seconds.  A stretch should never be painful. You should only feel a gentle lengthening or release in the stretched tissue. FLEXION RANGE OF MOTION AND STRETCHING EXERCISES: STRETCH - Flexion, Single Knee to Chest   Lie on a firm bed or floor with both legs extended in front of you.  Keeping one leg in contact with the floor, bring your opposite knee to your chest. Hold your leg in place by either grabbing behind your thigh or at your knee.  Pull until you feel a gentle stretch in your low back. Hold __________ seconds.  Slowly release your grasp and repeat the exercise with the opposite side. Repeat __________ times. Complete this exercise __________ times per day.  STRETCH - Flexion, Double Knee to Chest  Lie on a firm bed or floor with both legs extended in front of you.  Keeping one leg in contact with the floor, bring your opposite knee to your chest.  Tense your stomach muscles to support your back and then lift your other knee to your chest. Hold your legs in place by either grabbing behind your thighs or at your knees.  Pull both knees toward your chest until you feel a gentle stretch  in your low back. Hold __________ seconds.  Tense your stomach muscles and slowly return one leg at a time to the floor. Repeat __________ times. Complete this exercise __________ times per day.  STRETCH - Low Trunk Rotation  Lie on a firm bed or floor. Keeping your legs in front of you, bend your knees so they are both pointed toward the ceiling and your feet are flat on the floor.  Extend your arms out to the side. This will stabilize your upper body by keeping your shoulders in contact with the floor.  Gently and slowly drop both knees together to one side until you feel a gentle stretch in your low back. Hold for __________ seconds.  Tense your stomach muscles to support your lower back as you bring your knees back to the starting position. Repeat the exercise to the other side. Repeat __________ times. Complete this exercise __________ times per day  EXTENSION RANGE OF MOTION AND FLEXIBILITY EXERCISES: STRETCH - Extension, Prone on Elbows   Lie on your stomach on the floor, a bed will be too soft. Place your palms about shoulder width apart and at the height of your head.  Place your elbows under your shoulders. If this is too painful, stack pillows under your chest.  Allow your body to relax so that your hips drop lower and make contact more completely with the floor.  Hold this position for __________ seconds.  Slowly return to lying flat on the floor. Repeat __________ times. Complete this exercise __________ times per day.  RANGE OF MOTION - Extension, Prone Press Ups  Lie on your stomach on the floor, a bed will be too soft. Place your palms about shoulder width apart and at the height of your head.  Keeping your back as relaxed as possible, slowly straighten your elbows while keeping your hips on the floor. You may adjust the placement of your hands to maximize your comfort. As you gain motion, your hands will come more underneath your shoulders.  Hold this position  __________ seconds.  Slowly return to lying flat on the floor. Repeat __________ times. Complete this exercise __________ times per day.  RANGE OF MOTION- Quadruped, Neutral Spine   Assume a hands and knees position on a firm surface. Keep your hands under your shoulders and your knees under your hips. You may place padding under your knees for comfort.  Drop your head and point your tailbone toward the ground below you. This will round out your lower back like an angry cat. Hold this position for __________ seconds.  Slowly lift your head and release your tail bone so that your back sags into a large arch, like an old horse.  Hold this position for __________ seconds.  Repeat this until you feel limber in your low back.  Now, find your "sweet spot." This will be the most comfortable position somewhere between the two previous positions. This is your neutral spine. Once you have found this position, tense your stomach  muscles to support your low back.  Hold this position for __________ seconds. Repeat __________ times. Complete this exercise __________ times per day.  STRENGTHENING EXERCISES - Low Back Sprain These exercises may help you when beginning to rehabilitate your injury. These exercises should be done near your "sweet spot." This is the neutral, low-back arch, somewhere between fully rounded and fully arched, that is your least painful position. When performed in this safe range of motion, these exercises can be used for people who have either a flexion or extension based injury. These exercises may resolve your symptoms with or without further involvement from your physician, physical therapist or athletic trainer. While completing these exercises, remember:   Muscles can gain both the endurance and the strength needed for everyday activities through controlled exercises.  Complete these exercises as instructed by your physician, physical therapist or athletic trainer. Increase  the resistance and repetitions only as guided.  You may experience muscle soreness or fatigue, but the pain or discomfort you are trying to eliminate should never worsen during these exercises. If this pain does worsen, stop and make certain you are following the directions exactly. If the pain is still present after adjustments, discontinue the exercise until you can discuss the trouble with your caregiver. STRENGTHENING - Deep Abdominals, Pelvic Tilt   Lie on a firm bed or floor. Keeping your legs in front of you, bend your knees so they are both pointed toward the ceiling and your feet are flat on the floor.  Tense your lower abdominal muscles to press your low back into the floor. This motion will rotate your pelvis so that your tail bone is scooping upwards rather than pointing at your feet or into the floor. With a gentle tension and even breathing, hold this position for __________ seconds. Repeat __________ times. Complete this exercise __________ times per day.  STRENGTHENING - Abdominals, Crunches   Lie on a firm bed or floor. Keeping your legs in front of you, bend your knees so they are both pointed toward the ceiling and your feet are flat on the floor. Cross your arms over your chest.  Slightly tip your chin down without bending your neck.  Tense your abdominals and slowly lift your trunk high enough to just clear your shoulder blades. Lifting higher can put excessive stress on the lower back and does not further strengthen your abdominal muscles.  Control your return to the starting position. Repeat __________ times. Complete this exercise __________ times per day.  STRENGTHENING - Quadruped, Opposite UE/LE Lift   Assume a hands and knees position on a firm surface. Keep your hands under your shoulders and your knees under your hips. You may place padding under your knees for comfort.  Find your neutral spine and gently tense your abdominal muscles so that you can maintain this  position. Your shoulders and hips should form a rectangle that is parallel with the floor and is not twisted.  Keeping your trunk steady, lift your right hand no higher than your shoulder and then your left leg no higher than your hip. Make sure you are not holding your breath. Hold this position for __________ seconds.  Continuing to keep your abdominal muscles tense and your back steady, slowly return to your starting position. Repeat with the opposite arm and leg. Repeat __________ times. Complete this exercise __________ times per day.  STRENGTHENING - Abdominals and Quadriceps, Straight Leg Raise   Lie on a firm bed or floor with both legs extended  in front of you.  Keeping one leg in contact with the floor, bend the other knee so that your foot can rest flat on the floor.  Find your neutral spine, and tense your abdominal muscles to maintain your spinal position throughout the exercise.  Slowly lift your straight leg off the floor about 6 inches for a count of 15, making sure to not hold your breath.  Still keeping your neutral spine, slowly lower your leg all the way to the floor. Repeat this exercise with each leg __________ times. Complete this exercise __________ times per day. POSTURE AND BODY MECHANICS CONSIDERATIONS - Low Back Sprain Keeping correct posture when sitting, standing or completing your activities will reduce the stress put on different body tissues, allowing injured tissues a chance to heal and limiting painful experiences. The following are general guidelines for improved posture. Your physician or physical therapist will provide you with any instructions specific to your needs. While reading these guidelines, remember:  The exercises prescribed by your provider will help you have the flexibility and strength to maintain correct postures.  The correct posture provides the best environment for your joints to work. All of your joints have less wear and tear when  properly supported by a spine with good posture. This means you will experience a healthier, less painful body.  Correct posture must be practiced with all of your activities, especially prolonged sitting and standing. Correct posture is as important when doing repetitive low-stress activities (typing) as it is when doing a single heavy-load activity (lifting). RESTING POSITIONS Consider which positions are most painful for you when choosing a resting position. If you have pain with flexion-based activities (sitting, bending, stooping, squatting), choose a position that allows you to rest in a less flexed posture. You would want to avoid curling into a fetal position on your side. If your pain worsens with extension-based activities (prolonged standing, working overhead), avoid resting in an extended position such as sleeping on your stomach. Most people will find more comfort when they rest with their spine in a more neutral position, neither too rounded nor too arched. Lying on a non-sagging bed on your side with a pillow between your knees, or on your back with a pillow under your knees will often provide some relief. Keep in mind, being in any one position for a prolonged period of time, no matter how correct your posture, can still lead to stiffness. PROPER SITTING POSTURE In order to minimize stress and discomfort on your spine, you must sit with correct posture. Sitting with good posture should be effortless for a healthy body. Returning to good posture is a gradual process. Many people can work toward this most comfortably by using various supports until they have the flexibility and strength to maintain this posture on their own. When sitting with proper posture, your ears will fall over your shoulders and your shoulders will fall over your hips. You should use the back of the chair to support your upper back. Your lower back will be in a neutral position, just slightly arched. You may place a small  pillow or folded towel at the base of your lower back for  support.  When working at a desk, create an environment that supports good, upright posture. Without extra support, muscles tire, which leads to excessive strain on joints and other tissues. Keep these recommendations in mind: CHAIR:  A chair should be able to slide under your desk when your back makes contact with the back  of the chair. This allows you to work closely.  The chair's height should allow your eyes to be level with the upper part of your monitor and your hands to be slightly lower than your elbows. BODY POSITION  Your feet should make contact with the floor. If this is not possible, use a foot rest.  Keep your ears over your shoulders. This will reduce stress on your neck and low back. INCORRECT SITTING POSTURES  If you are feeling tired and unable to assume a healthy sitting posture, do not slouch or slump. This puts excessive strain on your back tissues, causing more damage and pain. Healthier options include:  Using more support, like a lumbar pillow.  Switching tasks to something that requires you to be upright or walking.  Talking a brief walk.  Lying down to rest in a neutral-spine position. PROLONGED STANDING WHILE SLIGHTLY LEANING FORWARD  When completing a task that requires you to lean forward while standing in one place for a long time, place either foot up on a stationary 2-4 inch high object to help maintain the best posture. When both feet are on the ground, the lower back tends to lose its slight inward curve. If this curve flattens (or becomes too large), then the back and your other joints will experience too much stress, tire more quickly, and can cause pain. CORRECT STANDING POSTURES Proper standing posture should be assumed with all daily activities, even if they only take a few moments, like when brushing your teeth. As in sitting, your ears should fall over your shoulders and your shoulders should  fall over your hips. You should keep a slight tension in your abdominal muscles to brace your spine. Your tailbone should point down to the ground, not behind your body, resulting in an over-extended swayback posture.  INCORRECT STANDING POSTURES  Common incorrect standing postures include a forward head, locked knees and/or an excessive swayback. WALKING Walk with an upright posture. Your ears, shoulders and hips should all line-up. PROLONGED ACTIVITY IN A FLEXED POSITION When completing a task that requires you to bend forward at your waist or lean over a low surface, try to find a way to stabilize 3 out of 4 of your limbs. You can place a hand or elbow on your thigh or rest a knee on the surface you are reaching across. This will provide you more stability, so that your muscles do not tire as quickly. By keeping your knees relaxed, or slightly bent, you will also reduce stress across your lower back. CORRECT LIFTING TECHNIQUES DO :  Assume a wide stance. This will provide you more stability and the opportunity to get as close as possible to the object which you are lifting.  Tense your abdominals to brace your spine. Bend at the knees and hips. Keeping your back locked in a neutral-spine position, lift using your leg muscles. Lift with your legs, keeping your back straight.  Test the weight of unknown objects before attempting to lift them.  Try to keep your elbows locked down at your sides in order get the best strength from your shoulders when carrying an object.  Always ask for help when lifting heavy or awkward objects. INCORRECT LIFTING TECHNIQUES DO NOT:   Lock your knees when lifting, even if it is a small object.  Bend and twist. Pivot at your feet or move your feet when needing to change directions.  Assume that you can safely pick up even a paperclip without proper posture.  This information is not intended to replace advice given to you by your health care provider. Make  sure you discuss any questions you have with your health care provider.   Document Released: 10/05/2005 Document Revised: 10/26/2014 Document Reviewed: 01/17/2009 [employer] Interactive Patient Education [employer].

## 2016-03-05 NOTE — Progress Notes (Signed)
Pre visit review using our clinic review tool, if applicable. No additional management support is needed unless otherwise documented below in the visit note. 

## 2016-03-17 ENCOUNTER — Ambulatory Visit (INDEPENDENT_AMBULATORY_CARE_PROVIDER_SITE_OTHER): Payer: Commercial Managed Care - HMO | Admitting: Family Medicine

## 2016-03-17 ENCOUNTER — Ambulatory Visit (INDEPENDENT_AMBULATORY_CARE_PROVIDER_SITE_OTHER)
Admission: RE | Admit: 2016-03-17 | Discharge: 2016-03-17 | Disposition: A | Discharge status: Home / Self Care | Payer: Commercial Managed Care - HMO | Source: Ambulatory Visit | Attending: Family Medicine | Admitting: Family Medicine

## 2016-03-17 ENCOUNTER — Encounter: Payer: Self-pay | Admitting: Family Medicine

## 2016-03-17 VITALS — BP 128/80 | HR 66 | Wt 214.0 lb

## 2016-03-17 DIAGNOSIS — M1710 Unilateral primary osteoarthritis, unspecified knee: Secondary | ICD-10-CM

## 2016-03-17 DIAGNOSIS — S32010A Wedge compression fracture of first lumbar vertebra, initial encounter for closed fracture: Secondary | ICD-10-CM | POA: Diagnosis not present

## 2016-03-17 DIAGNOSIS — M545 Low back pain: Secondary | ICD-10-CM

## 2016-03-17 DIAGNOSIS — M5417 Radiculopathy, lumbosacral region: Secondary | ICD-10-CM

## 2016-03-17 DIAGNOSIS — M5416 Radiculopathy, lumbar region: Secondary | ICD-10-CM

## 2016-03-17 MED ORDER — GABAPENTIN 100 MG PO CAPS: 200.0000 mg | ORAL_CAPSULE | Freq: Every day | ORAL | Status: DC

## 2016-03-17 NOTE — Assessment & Plan Note (Signed)
Bilateral injections given again today. We did discuss with patient the possible surgical intervention such as needed basis may be necessary if she continues to have difficulty. Declined custom bracing again.

## 2016-03-17 NOTE — Patient Instructions (Addendum)
Good  To see you  I am sorry you are hurting.  I hope this is all from the fall.  We injected the knees again but if they continue to hurt we are running out of options.  In the right knee we could do orthovisc if needed but it is a series of 4 injections.  For the back get xray downstairs Gabapentin 100mg  at night for the first 3 nights then increase to 200mg  nightly thereafter See me again in 2 weeks to make sure you are getting better.

## 2016-03-17 NOTE — Progress Notes (Signed)
[name] [place], [place] [address] Phone: [phone] Subjective:      CC: Bilateral knee pain follow up   [id number] [name] is a 70 y.o. female coming in with complaint of  bilateral knee pain. 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  Patient did have viscous supple mentation on her right knee 2 months ago and pain is starting come back. Patient did have a fall recently that thinks it exacerbated both knees. Patient was also given amount of disc on the left knee. Patient states unfortunately that that did not seem to help as much. Was doing better until her fall. Patient did have a fall falling on both of her knees as well as than falling on her back. Patient states that she did see another provider. No x-rays were taken. States that he continues to give her pain. Possible radicular symptoms going down the to the knees. Does not know if they're associated or not. Feels that her legs are weak. Denies falling thumb other than the initial fall. Patient states that she has noticed that she's been doing less activity on a regular basis. Can keep her up at night.  Past Medical History  Diagnosis Date  . HTN (hypertension)   . Hyperlipidemia   . Obesity   . Osteoarthritis   . DJD (degenerative joint disease), lumbar   . Chronic headaches   . GERD (gastroesophageal reflux disease)   . Allergic rhinitis, cause unspecified   . Hiatal hernia   . Hemorrhoids   . Diverticulosis   . Tubular adenoma of colon   . Anemia   . Allergy     SEASONAL   Past Surgical History  Procedure Laterality Date  . Tubal ligation    . Colonoscopy    . Ablation     Social History  Substance Use Topics  . Smoking status: Never Smoker   . Smokeless tobacco: Never Used  . Alcohol Use: No   Allergies  Allergen Reactions  . Soybean-Containing Drug Products       Allergy testing positive for soy beans but pt uses soy sauce  . Sulfonamide Derivatives    Family History  Problem Relation Age of Onset  . Heart disease Mother   . Hypertension Mother   . Diabetes Brother   . Colon cancer Neg Hx   . Esophageal cancer Neg Hx   . Rectal cancer Neg Hx   . Stomach cancer Neg Hx        Past medical history, social, surgical and family history all reviewed and no pertinent info pertaining to chief complaint. All other was reviewed using the EMR.    Review of Systems: No headache, visual changes, nausea, vomiting, diarrhea, constipation, dizziness, abdominal pain, skin rash, fevers, chills, night sweats, weight loss, swollen lymph nodes, body aches, joint swelling, muscle aches, chest pain, shortness of breath, mood changes.   Objective Blood pressure 128/80, pulse 66, weight 214 lb (97.07 kg).  General: No apparent distress alert and oriented x3 mood and affect normal, dressed appropriately. Overweight HEENT: Pupils equal, extraocular movements intact  Respiratory: Patient's speak in full sentences and does not appear short of breath  Cardiovascular: No lower extremity edema, non tender, no erythema  Skin: Warm dry intact with no signs of infection or rash on extremities or on axial skeleton.  Abdomen: Soft nontender  Neuro: Cranial  nerves II through XII are intact, neurovascularly intact in all extremities with 2+ DTRs and 2+ pulses.  Lymph: No lymphadenopathy of posterior or anterior cervical chain or axillae bilaterally.  Gait antalgic gait which is new MSK:  Non tender with full range of motion and good stability and symmetric strength and tone of shoulders, elbows, wrist, hips bilaterally. Arthritic changes of multiple joints Back exam shows the patient does have severe tenderness of the upper lumbar spine as well as midline. Significant tightness of the hamstrings but denies radicular symptoms with straight leg test. Unable to do [facility] test  secondary to the pain in the knees. Knee: Bilateral Valgus deformity of the knees notedpatient does have a significant thigh to calf ratio. Tenderness to palpation bilaterally mostly of the medial joint line Instability of the knees bilaterally with valgus force Ligaments with solid consistent endpoints including ACL, PCL, LCL, MCL. She  does have some instability with valgus stress Negative Mcmurray's, Apley's, and Thessalonian tests. Patellar glide moderate crepitus. Patellar and quadriceps tendons unremarkable. Hamstring and quadriceps strength is normal.  Worsening exam  After informed written and verbal consent, patient was seated on exam table. Right knee was prepped with alcohol swab and utilizing anterolateral approach, patient's right knee space was injected with 4:1  marcaine 0.5%: Kenalog 40mg /dL. Patient tolerated the procedure well without immediate complications.  After informed written and verbal consent, patient was seated on exam table. Left knee was prepped with alcohol swab and utilizing anterolateral approach, patient's left knee space was injected with 4:1  marcaine 0.5%: Kenalog 40mg /dL. Patient tolerated the procedure well without immediate complications.     Impression and Recommendations:     This case required medical decision making of moderate complexity.

## 2016-03-17 NOTE — Assessment & Plan Note (Signed)
Concerned the patient is having more of a radicular symptoms of her back. Patient's fall I am concerned with the potential compression fracture with the midline pain. We will get x-rays today to see if there is such a thing. Patient will be treated with medications including gabapentin that we'll help with some of the radicular symptoms. We discussed icing regimen. Patient given injections in the knees and see if this will be beneficial. Patient will follow-up and see me again in 10-14 days for further evaluation and treatment. Depending on findings we'll discuss that advance imaging is warranted.

## 2016-03-27 ENCOUNTER — Encounter: Payer: Self-pay | Admitting: Internal Medicine

## 2016-03-31 ENCOUNTER — Encounter: Payer: Self-pay | Admitting: Family Medicine

## 2016-03-31 ENCOUNTER — Ambulatory Visit (INDEPENDENT_AMBULATORY_CARE_PROVIDER_SITE_OTHER): Payer: Commercial Managed Care - HMO | Admitting: Family Medicine

## 2016-03-31 ENCOUNTER — Encounter: Payer: Self-pay | Admitting: Internal Medicine

## 2016-03-31 ENCOUNTER — Ambulatory Visit (INDEPENDENT_AMBULATORY_CARE_PROVIDER_SITE_OTHER): Payer: Commercial Managed Care - HMO | Admitting: Internal Medicine

## 2016-03-31 VITALS — BP 122/78 | HR 62 | Wt 212.0 lb

## 2016-03-31 VITALS — BP 130/72 | HR 80 | Temp 98.4°F | Resp 20 | Wt 212.0 lb

## 2016-03-31 DIAGNOSIS — Z0001 Encounter for general adult medical examination with abnormal findings: Secondary | ICD-10-CM | POA: Diagnosis not present

## 2016-03-31 DIAGNOSIS — M545 Low back pain: Secondary | ICD-10-CM

## 2016-03-31 DIAGNOSIS — I10 Essential (primary) hypertension: Secondary | ICD-10-CM | POA: Diagnosis not present

## 2016-03-31 DIAGNOSIS — S32010A Wedge compression fracture of first lumbar vertebra, initial encounter for closed fracture: Secondary | ICD-10-CM | POA: Diagnosis not present

## 2016-03-31 DIAGNOSIS — M25561 Pain in right knee: Secondary | ICD-10-CM | POA: Diagnosis not present

## 2016-03-31 DIAGNOSIS — E785 Hyperlipidemia, unspecified: Secondary | ICD-10-CM

## 2016-03-31 DIAGNOSIS — M25562 Pain in left knee: Secondary | ICD-10-CM | POA: Diagnosis not present

## 2016-03-31 DIAGNOSIS — R1900 Intra-abdominal and pelvic swelling, mass and lump, unspecified site: Secondary | ICD-10-CM | POA: Insufficient documentation

## 2016-03-31 DIAGNOSIS — R6889 Other general symptoms and signs: Secondary | ICD-10-CM

## 2016-03-31 MED ORDER — GABAPENTIN 300 MG PO CAPS: 300.0000 mg | ORAL_CAPSULE | Freq: Every day | ORAL | Status: DC

## 2016-03-31 MED ORDER — CALCITONIN (SALMON) 200 UNIT/ACT NA SOLN: 1.0000 | Freq: Every day | NASAL | Status: DC

## 2016-03-31 NOTE — Progress Notes (Signed)
[name] [place], [place] [address] Phone: [phone] Subjective:      CC: Bilateral knee pain follow up Back pain follow-up   [id number] [name] is a 70 y.o. female coming in with complaint of  bilateral knee pain. 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  Patient did have viscous supple mentation on her right knee 2 months ago and pain is starting come back. Patient did have bilateral steroid injections at last exam 4 weeks ago. An states that the knee pain is worsening. Still wants to avoid any surgical intervention and we'll wait another 6 weeks until she can have injection she states.  Patient was having significant back pain. Was having lower back pain. Sent for x-rays and a possible L1 compression fracture was diagnosed. Patient was to do range of motion exercises and was given gabapentin at night. States that no significant improvement at this time. No side effect to the medications. Continues to have the pain mostly in the back with very minimal pain going down the legs. Denies any weakness numbness or any bowel or bladder incontinence.  Past Medical History  Diagnosis Date  . HTN (hypertension)   . Hyperlipidemia   . Obesity   . Osteoarthritis   . DJD (degenerative joint disease), lumbar   . Chronic headaches   . GERD (gastroesophageal reflux disease)   . Allergic rhinitis, cause unspecified   . Hiatal hernia   . Hemorrhoids   . Diverticulosis   . Tubular adenoma of colon   . Anemia   . Allergy     SEASONAL   Past Surgical History  Procedure Laterality Date  . Tubal ligation    . Colonoscopy    . Ablation     Social History  Substance Use Topics  . Smoking status: Never Smoker   . Smokeless tobacco: Never Used  . Alcohol Use: No   Allergies  Allergen Reactions  . Soybean-Containing Drug Products     Allergy  testing positive for soy beans but pt uses soy sauce  . Sulfonamide Derivatives    Family History  Problem Relation Age of Onset  . Heart disease Mother   . Hypertension Mother   . Diabetes Brother   . Colon cancer Neg Hx   . Esophageal cancer Neg Hx   . Rectal cancer Neg Hx   . Stomach cancer Neg Hx        Past medical history, social, surgical and family history all reviewed and no pertinent info pertaining to chief complaint. All other was reviewed using the EMR.    Review of Systems: No headache, visual changes, nausea, vomiting, diarrhea, constipation, dizziness, abdominal pain, skin rash, fevers, chills, night sweats, weight loss, swollen lymph nodes, body aches, joint swelling, muscle aches, chest pain, shortness of breath, mood changes.   Objective Blood pressure 122/78, pulse 62, weight 212 lb (96.163 kg).  General: No apparent distress alert and oriented x3 mood and affect normal, dressed appropriately. Overweight HEENT: Pupils equal, extraocular movements intact  Respiratory: Patient's speak in full sentences and does not appear short of breath  Cardiovascular: No lower extremity edema, non tender, no erythema  Skin: Warm dry intact with no signs of infection or rash on extremities or on axial skeleton.  Abdomen: Soft nontender  Neuro: Cranial nerves II through XII are intact, neurovascularly intact in all extremities with 2+  DTRs and 2+ pulses.  Lymph: No lymphadenopathy of posterior or anterior cervical chain or axillae bilaterally.  Gait antalgic gait which is new MSK:  Non tender with full range of motion and good stability and symmetric strength and tone of shoulders, elbows, wrist, hips bilaterally. Arthritic changes of multiple joints Back exam shows the patient does have severe tenderness of the upper lumbar spine as well as midline. Significant tightness of the hamstrings but denies radicular symptoms with straight leg test. Unable to do [facility] test secondary to the  pain in the knees.Seems unstable from patient's previous exam. Knee: Bilateral Valgus deformity of the knees notedpatient does have a significant thigh to calf ratio. Tenderness to palpation bilaterally mostly of the medial joint line Instability of the knees bilaterally with valgus force Ligaments with solid consistent endpoints including ACL, PCL, LCL, MCL. She  does have some instability with valgus stress Negative Mcmurray's, Apley's, and Thessalonian tests. Patellar glide moderate crepitus. Patellar and quadriceps tendons unremarkable. Hamstring and quadriceps strength is normal.  Worsening exam      Impression and Recommendations:     This case required medical decision making of moderate complexity.

## 2016-03-31 NOTE — Patient Instructions (Signed)
Please continue all other medications as before, and refills have been done if requested.  Please have the pharmacy call with any other refills you may need.  Please continue your efforts at being more active, low cholesterol diet, and weight control.  You are otherwise up to date with prevention measures today.  Please keep your appointments with your specialists as you may have planned  You will be contacted regarding the referral for: pelvic ultrasound  Please go to the LAB in the Basement (turn left off the elevator) for the tests to be done today  You will be contacted by phone if any changes need to be made immediately.  Otherwise, you will receive a letter about your results with an explanation, but please check with MyChart first.  Please remember to sign up for MyChart if you have not done so, as this will be important to you in the future with finding out test results, communicating by private email, and scheduling acute appointments online when needed.  Please return in 6 months, or sooner if needed

## 2016-03-31 NOTE — Assessment & Plan Note (Signed)
Severe bone-on-bone osteophytic changes of the knees. This will continue to give her trouble. We discussed surgical options would be most beneficial for her at this time. Patient wants to avoid that. Patient has to wait a week still other injections again. Patient discussed tramadol at this time.

## 2016-03-31 NOTE — Progress Notes (Signed)
Subjective:    Patient ID: [name], female    DOB: 02/02/1946, 70 y.o.   MRN: [id number]  HPI  Here for wellness and f/u;  Overall doing ok;  Pt denies Chest pain, worsening SOB, DOE, wheezing, orthopnea, PND, worsening LE edema, palpitations, dizziness or syncope.  Pt denies neurological change such as new headache, facial or extremity weakness.  Pt denies polydipsia, polyuria, or low sugar symptoms. Pt states overall good compliance with treatment and medications, good tolerability, and has been trying to follow appropriate diet.  Pt denies worsening depressive symptoms, suicidal ideation or panic. No fever, night sweats, wt loss, loss of appetite, or other constitutional symptoms.  Pt states good ability with ADL's, has low fall risk, home safety reviewed and adequate, no other significant changes in hearing or vision, and only occasionally active with exercise. Declines tetanus for now.  Pt continues to have recurring LBP without change in severity, bowel or bladder change, fever, wt loss,  worsening LE pain/numbness/weakness, gait change or recurring falls, has seen Dr Tamala Julian this AM after recent fall on stairs with compression fx, DXA normal April 2017.  Still on chronic iron per GI with recent normal iron levels.  Has bilat knee pain, s/p injections per Dr Tamala Julian, doing ok but still limps to stand, may need TKR but is not wanting at this time  Also has hx of what sounds like uterine ablation x 2 for menorrhagia which has not recurred, thinks she might have been informed she had uterine fibroid but is not sure. Past Medical History  Diagnosis Date  . HTN (hypertension)   . Hyperlipidemia   . Obesity   . Osteoarthritis   . DJD (degenerative joint disease), lumbar   . Chronic headaches   . GERD (gastroesophageal reflux disease)   . Allergic rhinitis, cause unspecified   . Hiatal hernia   . Hemorrhoids   . Diverticulosis   . Tubular adenoma of colon   . Anemia   . Allergy     SEASONAL     Past Surgical History  Procedure Laterality Date  . Tubal ligation    . Colonoscopy    . Ablation      reports that she has never smoked. She has never used smokeless tobacco. She reports that she does not drink alcohol or use illicit drugs. family history includes Diabetes in her brother; Heart disease in her mother; Hypertension in her mother. There is no history of Colon cancer, Esophageal cancer, Rectal cancer, or Stomach cancer. Allergies  Allergen Reactions  . Soybean-Containing Drug Products     Allergy testing positive for soy beans but pt uses soy sauce  . Sulfonamide Derivatives    Current Outpatient Prescriptions on File Prior to Visit  Medication Sig Dispense Refill  . atorvastatin (LIPITOR) 20 MG tablet TAKE 1 TABLET EVERY DAY 90 tablet 2  . azelastine (ASTELIN) 0.1 % nasal spray Place 1-2 sprays into both nostrils at bedtime. 30 mL 5  . cetirizine (ZYRTEC) 10 MG tablet Take 1 tablet (10 mg total) by mouth daily. As needed 90 tablet 3  . Cholecalciferol (VITAMIN D3) 2000 UNITS capsule Take 2,000 Units by mouth daily.    Marland Kitchen diltiazem (TIAZAC) 120 MG 24 hr capsule TAKE 1 CAPSULE EVERY DAY 90 capsule 2  . esomeprazole (NEXIUM) 40 MG capsule Take 1 capsule (40 mg total) by mouth daily. 30 capsule 3  . ferrous sulfate 325 (65 FE) MG tablet Take 1 tablet (325 mg total) by mouth daily  with breakfast. 30 tablet 2  . HYDROcodone-acetaminophen (NORCO/VICODIN) 5-325 MG tablet Take 1 tablet by mouth every 6 (six) hours as needed for moderate pain. 60 tablet 0  . losartan-hydrochlorothiazide (HYZAAR) 100-25 MG tablet TAKE 1 TABLET EVERY DAY (YEARLY PHYSICAL IS DUE MUST SEE MD FOR FUTURE REFILLS) 90 tablet 0  . metoprolol (LOPRESSOR) 50 MG tablet TAKE 1 TABLET TWICE DAILY (YEARLY PHYSICAL IS DUE. MUST SEE DOCTOR FOR FURTHER REFILLS) 180 tablet 0  . sertraline (ZOLOFT) 50 MG tablet Take 1 tablet (50 mg total) by mouth daily. 90 tablet 2  . sucralfate (CARAFATE) 1 G tablet Take 1 g by  mouth as needed. Reported on 12/20/2015    . triamcinolone (NASACORT AQ) 55 MCG/ACT AERO nasal inhaler Place 2 sprays into the nose daily. 1 Inhaler 12  . Turmeric 500 MG CAPS Take 1 capsule by mouth daily.     No current facility-administered medications on file prior to visit.     Review of Systems Constitutional: Negative for increased diaphoresis, or other activity, appetite or siginficant weight change other than noted HENT: Negative for worsening hearing loss, ear pain, facial swelling, mouth sores and neck stiffness.   Eyes: Negative for other worsening pain, redness or visual disturbance.  Respiratory: Negative for choking or stridor Cardiovascular: Negative for other chest pain and palpitations.  Gastrointestinal: Negative for worsening diarrhea, blood in stool, or abdominal distention Genitourinary: Negative for hematuria, flank pain or change in urine volume.  Musculoskeletal: Negative for myalgias or other joint complaints.  Skin: Negative for other color change and wound or drainage.  Neurological: Negative for syncope and numbness. other than noted Hematological: Negative for adenopathy. or other swelling Psychiatric/Behavioral: Negative for hallucinations, SI, self-injury, decreased concentration or other worsening agitation.      Objective:   Physical Exam BP 130/72 mmHg  Pulse 80  Temp(Src) 98.4 F (36.9 C) (Oral)  Resp 20  Wt 212 lb (96.163 kg)  SpO2 93% VS noted,  Constitutional: Pt is oriented to person, place, and time. Appears well-developed and well-nourished, in no significant distress Head: Normocephalic and atraumatic  Eyes: Conjunctivae and EOM are normal. Pupils are equal, round, and reactive to light Right Ear: External ear normal.  Left Ear: External ear normal Nose: Nose normal.  Mouth/Throat: Oropharynx is clear and moist  Neck: Normal range of motion. Neck supple. No JVD present. No tracheal deviation present or significant neck LA or  mass Cardiovascular: Normal rate, regular rhythm, normal heart sounds and intact distal pulses.   Pulmonary/Chest: Effort normal and breath sounds without rales or wheezing  Abdominal: Soft. Bowel sounds are normal. NT. No HSM but does have firm fullness/mass just left of midline low mid abd without tenderness, guarding or rebound Musculoskeletal: Normal range of motion. Exhibits no edema Lymphadenopathy: Has no cervical adenopathy.  Neurological: Pt is alert and oriented to person, place, and time. Pt has normal reflexes. No cranial nerve deficit. Motor grossly intact Skin: Skin is warm and dry. No rash noted or new ulcers Psychiatric:  Has normal mood and affect. Behavior is normal.   Lab Results  Component Value Date   WBC 8.3 01/23/2016   HGB 13.3 01/23/2016   HCT 39.0 01/23/2016   PLT 302.0 01/23/2016   GLUCOSE 91 09/18/2014   CHOL 172 09/18/2014   TRIG 115.0 09/18/2014   HDL 53.30 09/18/2014   LDLDIRECT 168.8 01/20/2012   LDLCALC 96 09/18/2014   ALT 21 09/18/2014   AST 21 09/18/2014   NA 138 09/18/2014  K 4.2 09/18/2014   CL 102 09/18/2014   CREATININE 0.9 09/18/2014   BUN 25* 09/18/2014   CO2 29 09/18/2014   TSH 0.70 09/18/2014  Most recent lumbar films: CLINICAL DATA: Fall 2 weeks ago with bilateral leg pain. Initial encounter.  EXAM: LUMBAR SPINE - COMPLETE 4+ VIEW  COMPARISON: 09/30/2008  FINDINGS: Transitional lumbosacral vertebra with rudimentary disc space at L5-S1, numbering based on levels ribs.  L1 superior endplate compression fracture with height loss measuring up to 25%. There is no evidence retropulsion.  Lower lumbar facet arthropathy that is advanced. Chronic grade 1 anterolisthesis at L3-4 and L4-5. Slight new L2-3 anterolisthesis. Milder disc degeneration with mild L4-5 disc narrowing.  Calcified uterine fibroid measuring up to 63 mm.  IMPRESSION: 1. L1 compression fracture with 25% height loss. Fracture is new from 2009 but  age-indeterminate. 2. Advanced lumbar facet arthropathy with anterolisthesis at L2-3, L3-4, L4-5. 3. Rudimentary disc space at L5-S1. 4. 6 cm calcified uterine fibroid.   Electronically Signed  By: Monte Fantasia M.D.  On: 03/17/2016 16:21  Most recent DXA: April 2017  Results:  Lumbar spine (L1-L4) Femoral neck (FN) 33% distal radius  T-score 1.7 RFN:-0.7 LFN:-0.5 n/a  Change in BMD from previous DXA test (%) n/a n/a n/a  (*) statistically significant  Assessment: the BMD is normal according to the [facility] classification for osteoporosis (see below).    CLINICAL DATA: Screening.  EXAM: DIGITAL SCREENING BILATERAL MAMMOGRAM WITH 3D TOMO WITH CAD  COMPARISON: Previous exam(s).  ACR Breast Density Category b: There are scattered areas of fibroglandular density.  FINDINGS: There are no findings suspicious for malignancy. Images were processed with CAD.  IMPRESSION: No mammographic evidence of malignancy. A result letter of this screening mammogram will be mailed directly to the patient.  RECOMMENDATION: Screening mammogram in one year. (Code:SM-B-01Y)  BI-RADS CATEGORY 1: Negative.   Electronically Signed  By: Margarette Canada M.D.  On: 12/11/2015 15:27    Assessment & Plan:

## 2016-03-31 NOTE — Progress Notes (Signed)
Pre visit review using our clinic review tool, if applicable. No additional management support is needed unless otherwise documented below in the visit note. 

## 2016-03-31 NOTE — Assessment & Plan Note (Signed)
Patient's x-rays to show the patient does have compression fracture. An unknown age. Likely though did happen from the palm based on the age. We discussed different treatment options. Patient has not responded well to what we have given her previously. I would like her to start calcitonin. I do not think that she is a candidate for kyphoplasty based on the age being indeterminant of the fracture. Patient will continue with range of motion exercises. Patient will do light activities and we discussed which she could potentially do at the gym. Patient will come back and see me again in 3 weeks. At that time we will get repeat x-rays to make sure that there is no progression. Gabapentin also increase.

## 2016-03-31 NOTE — Patient Instructions (Signed)
I am sorry not a lot better  I would like to increase gabapentin to 300mg  at night.  Stop the 200mg  at night We will try calcitonin for the compression fracture in your back.  1 spray in a nostril daily, alternate nostrils.  For the knees have to wait another 2 months.  Tramadol with tylenol 2 times a day can help with all of your pain  See me again in 3 weeks and get xray of your back before you check in.

## 2016-04-01 ENCOUNTER — Encounter: Payer: Commercial Managed Care - HMO | Admitting: Internal Medicine

## 2016-04-06 NOTE — Assessment & Plan Note (Signed)
?   Fibroid, etiology not clear, new per pt, for pelvic u/s/transvag u/s

## 2016-04-06 NOTE — Assessment & Plan Note (Signed)
stable overall by history and exam, recent data reviewed with pt, and pt to continue medical treatment as before,  to f/u any worsening symptoms or concerns BP Readings from Last 3 Encounters:  03/31/16 130/72  03/31/16 122/78  03/17/16 128/80

## 2016-04-06 NOTE — Assessment & Plan Note (Signed)
stable overall by history and exam, recent data reviewed with pt, and pt to continue medical treatment as before,  to f/u any worsening symptoms or concerns Lab Results  Component Value Date   LDLCALC 96 09/18/2014

## 2016-04-06 NOTE — Assessment & Plan Note (Signed)

## 2016-04-10 ENCOUNTER — Ambulatory Visit
Admission: RE | Admit: 2016-04-10 | Discharge: 2016-04-10 | Disposition: A | Discharge status: Home / Self Care | Payer: Commercial Managed Care - HMO | Source: Ambulatory Visit | Attending: Internal Medicine | Admitting: Internal Medicine

## 2016-04-10 DIAGNOSIS — D259 Leiomyoma of uterus, unspecified: Secondary | ICD-10-CM | POA: Diagnosis not present

## 2016-04-10 DIAGNOSIS — R1900 Intra-abdominal and pelvic swelling, mass and lump, unspecified site: Secondary | ICD-10-CM

## 2016-04-13 ENCOUNTER — Other Ambulatory Visit: Payer: Self-pay | Admitting: Internal Medicine

## 2016-04-16 ENCOUNTER — Other Ambulatory Visit: Payer: Self-pay

## 2016-04-16 MED ORDER — SERTRALINE HCL 50 MG PO TABS: 50.0000 mg | ORAL_TABLET | Freq: Every day | ORAL | Status: DC

## 2016-04-20 ENCOUNTER — Ambulatory Visit (INDEPENDENT_AMBULATORY_CARE_PROVIDER_SITE_OTHER)
Admission: RE | Admit: 2016-04-20 | Discharge: 2016-04-20 | Disposition: A | Discharge status: Home / Self Care | Payer: Commercial Managed Care - HMO | Source: Ambulatory Visit | Attending: Family Medicine | Admitting: Family Medicine

## 2016-04-20 ENCOUNTER — Other Ambulatory Visit: Payer: Self-pay | Admitting: Internal Medicine

## 2016-04-20 DIAGNOSIS — M545 Low back pain: Secondary | ICD-10-CM

## 2016-04-20 DIAGNOSIS — S32010A Wedge compression fracture of first lumbar vertebra, initial encounter for closed fracture: Secondary | ICD-10-CM | POA: Diagnosis not present

## 2016-04-24 ENCOUNTER — Encounter: Payer: Self-pay | Admitting: Family Medicine

## 2016-04-24 ENCOUNTER — Ambulatory Visit (INDEPENDENT_AMBULATORY_CARE_PROVIDER_SITE_OTHER): Payer: Commercial Managed Care - HMO | Admitting: Family Medicine

## 2016-04-24 DIAGNOSIS — M4726 Other spondylosis with radiculopathy, lumbar region: Secondary | ICD-10-CM

## 2016-04-24 DIAGNOSIS — S32010A Wedge compression fracture of first lumbar vertebra, initial encounter for closed fracture: Secondary | ICD-10-CM

## 2016-04-24 DIAGNOSIS — M1711 Unilateral primary osteoarthritis, right knee: Secondary | ICD-10-CM | POA: Diagnosis not present

## 2016-04-24 MED ORDER — GABAPENTIN 100 MG PO CAPS: 100.0000 mg | ORAL_CAPSULE | Freq: Two times a day (BID) | ORAL | Status: DC

## 2016-04-24 NOTE — Progress Notes (Signed)
[name] [name], [name] Phone: [phone] Subjective:      CC: Bilateral knee pain follow up Back pain follow-up   [id number] [name] is a 70 y.o. female coming in with complaint of  bilateral knee pain. 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  Worsening right knee. Patient was given viscous supplementation 3 months ago. Patient is starting have worsening pain again. States that there is increasing instability. Does not wear the brace on a regular basis. Still does not want any surgical intervention.  Patient was having significant back pain. Was having lower back pain. Sent for x-rays and a possible L1 compression fracture was diagnosed. Patient was prone on calcitonin as well as gabapentin. States that it is improving slowly. Still has pain and still has the radiation on the left posterior leg sometimes. Does seem to be improving. Patient is concerned because still affect activities and does fatigue her if she walks longer than 100 yards.  Past Medical History  Diagnosis Date  . HTN (hypertension)   . Hyperlipidemia   . Obesity   . Osteoarthritis   . DJD (degenerative joint disease), lumbar   . Chronic headaches   . GERD (gastroesophageal reflux disease)   . Allergic rhinitis, cause unspecified   . Hiatal hernia   . Hemorrhoids   . Diverticulosis   . Tubular adenoma of colon   . Anemia   . Allergy     SEASONAL   Past Surgical History  Procedure Laterality Date  . Tubal ligation    . Colonoscopy    . Ablation     Social History  Substance Use Topics  . Smoking status: Never Smoker   . Smokeless tobacco: Never Used  . Alcohol Use: No   Allergies  Allergen Reactions  . Soybean-Containing Drug Products     Allergy testing positive for soy beans but pt uses soy sauce  . Sulfonamide Derivatives    Family  History  Problem Relation Age of Onset  . Heart disease Mother   . Hypertension Mother   . Diabetes Brother   . Colon cancer Neg Hx   . Esophageal cancer Neg Hx   . Rectal cancer Neg Hx   . Stomach cancer Neg Hx        Past medical history, social, surgical and family history all reviewed and no pertinent info pertaining to chief complaint. All other was reviewed using the EMR.    Review of Systems: No headache, visual changes, nausea, vomiting, diarrhea, constipation, dizziness, abdominal pain, skin rash, fevers, chills, night sweats, weight loss, swollen lymph nodes, body aches, joint swelling, muscle aches, chest pain, shortness of breath, mood changes.   Objective Blood pressure 128/84, pulse 62, height 5\' 3"  (1.6 m), weight 211 lb (95.709 kg), SpO2 97 %.  General: No apparent distress alert and oriented x3 mood and affect normal, dressed appropriately. Overweight HEENT: Pupils equal, extraocular movements intact  Respiratory: Patient's speak in full sentences and does not appear short of breath  Cardiovascular: No lower extremity edema, non tender, no erythema  Skin: Warm dry intact with no signs of infection or rash on extremities or on axial skeleton.  Abdomen: Soft nontender  Neuro: Cranial nerves II through XII are intact, neurovascularly intact in all extremities with 2+ DTRs and 2+ pulses.  Lymph: No lymphadenopathy of posterior or anterior cervical chain  or axillae bilaterally.  Gait antalgic gait still present but improved MSK:  Non tender with full range of motion and good stability and symmetric strength and tone of shoulders, elbows, wrist, hips bilaterally. Arthritic changes of multiple joints Back exam shows less tenderness than previously. Improving range of motion. Patient still no severely tender to palpation in the thoracolumbar juncture. Still has pain with extension greater than 10. Mild radicular symptoms still down the left leg with positive straight leg  test  Knee: Bilateral Valgus deformity of the knees notedpatient does have a significant thigh to calf ratio. Significant increased tenderness to palpation over the medial knee on the right knee more than left Ligaments with solid consistent endpoints including ACL, PCL, LCL, MCL. She  does have some instability with valgus stress Negative Mcmurray's, Apley's, and Thessalonian tests. Patellar glide moderate crepitus.or on the right and left Patellar and quadriceps tendons unremarkable. Hamstring and quadriceps strength is normal.  Worsening examespecially of the right knee  After informed written and verbal consent, patient was seated on exam table. Right knee was prepped with alcohol swab and utilizing anterolateral approach, patient's right knee space was injected with 4:1  marcaine 0.5%: Kenalog 40mg /dL. Patient tolerated the procedure well without immediate complications.    Impression and Recommendations:     This case required medical decision making of moderate complexity.

## 2016-04-24 NOTE — Patient Instructions (Addendum)
Good to see you  Ice 20 minutes 2 times daily. Usually after activity and before bed. For the back lets keep trucking along Increase gabapentin 100mg  in AM, 100mg  in afternoon and 300mg  at night We injected the right knee today and hopefully it will help Call me in 2 weeks and if back is not a lot better we will consider MRI.  See me otherwise in 6 weeks and you would be due for the left knee.

## 2016-04-24 NOTE — Assessment & Plan Note (Addendum)
Compression fracture seems to be improving some.repeat x-rays that were independently visualized by me today shows that patient has had no significant worsening of the compression fracture and seems to be stable. Longer needs calcitonin. We'll continue with the gabapentin.we will increase patient's gabapentin as well. Encourage vitamin supplementation. Patient will come back and see me again in 3 weeks. This liner patient continues to improve we'll continue with conservative therapy. The first 8 symptoms possible CT or MRI will be necessary.

## 2016-04-24 NOTE — Progress Notes (Signed)
Pre visit review using our clinic review tool, if applicable. No additional management support is needed unless otherwise documented below in the visit note. 

## 2016-04-24 NOTE — Assessment & Plan Note (Signed)
Patient given injection today and tolerated the procedure well. We discussed icing regimen and home exercises. Discussed which activities to do an which was to avoid. Encourage her to wear the brace on a more regular basis. Patient will come back and see me again in 3-4 weeks. Did not do for viscous supplementation until October.

## 2016-05-06 DIAGNOSIS — Z0100 Encounter for examination of eyes and vision without abnormal findings: Secondary | ICD-10-CM | POA: Diagnosis not present

## 2016-05-19 NOTE — Progress Notes (Signed)
[name] [address] [place], [place] [address] Phone: [phone] Subjective:      CC: Bilateral knee pain follow up Back pain follow-up   [id number]  [name] is a 70 y.o. female coming in with complaint of  bilateral knee pain. 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  Given an injection in the right knee 4 weeks ago. Patient states right knee seems to be doing relatively well. Patient's left knee was given an injection greater than 3 months ago. Patient states left knee is giving her more pain. Having more instability. Feels like she needs an injection in this knee.  Patient was having significant back pain. Was having lower back pain. Sent for x-rays and a possible L1 compression fracture was diagnosed. Patient also had significant arthritic changes. Patient states gabapentin and tramadol has been somewhat helpful. Continues to have pain though. Continued have pain seemed to be going radiating down the legs. Patient states that this is affecting her daily activities more than her disease. Patient feels like overall she seems to be worsening.  Past Medical History:  Diagnosis Date  . Allergic rhinitis, cause unspecified   . Allergy    SEASONAL  . Anemia   . Chronic headaches   . Diverticulosis   . DJD (degenerative joint disease), lumbar   . GERD (gastroesophageal reflux disease)   . Hemorrhoids   . Hiatal hernia   . HTN (hypertension)   . Hyperlipidemia   . Obesity   . Osteoarthritis   . Tubular adenoma of colon    Past Surgical History:  Procedure Laterality Date  . ABLATION    . COLONOSCOPY    . TUBAL LIGATION     Social History  Substance Use Topics  . Smoking status: Never Smoker  . Smokeless tobacco: Never Used  . Alcohol use No   Allergies  Allergen Reactions  . Soybean-Containing Drug Products     Allergy testing  positive for soy beans but pt uses soy sauce  . Sulfonamide Derivatives    Family History  Problem Relation Age of Onset  . Heart disease Mother   . Hypertension Mother   . Diabetes Brother   . Colon cancer Neg Hx   . Esophageal cancer Neg Hx   . Rectal cancer Neg Hx   . Stomach cancer Neg Hx        Past medical history, social, surgical and family history all reviewed and no pertinent info pertaining to chief complaint. All other was reviewed using the EMR.    Review of Systems: No headache, visual changes, nausea, vomiting, diarrhea, constipation, dizziness, abdominal pain, skin rash, fevers, chills, night sweats, weight loss, swollen lymph nodes, body aches, joint swelling, muscle aches, chest pain, shortness of breath, mood changes.   Objective  Blood pressure 118/82, pulse 65, weight 208 lb (94.3 kg), SpO2 96 %.  General: No apparent distress alert and oriented x3 mood and affect normal, dressed appropriately. Overweight HEENT: Pupils equal, extraocular movements intact  Respiratory: Patient's speak in full sentences and does not appear short of breath  Cardiovascular: No lower extremity edema, non tender, no erythema  Skin: Warm dry intact with no signs of infection or rash on extremities or on axial skeleton.  Abdomen: Soft nontender  Neuro: Cranial nerves II through XII are intact, neurovascularly intact in all extremities with 2+ DTRs and 2+ pulses.  Lymph: No lymphadenopathy of posterior or anterior cervical chain or axillae bilaterally.  Gait antalgic gait still present but improved MSK:  Non tender with full range of motion and good stability and symmetric strength and tone of shoulders, elbows, wrist, hips bilaterally. Arthritic changes of multiple joints Back exam shows less tenderness than previously. Improving range of motion. Patient still no severely tender to palpation in the thoracolumbar juncture. Still has pain with extension greater than 10. Mild radicular  symptoms still down the left leg with positive straight leg test Received from previous exam.  Knee: Bilateral Valgus deformity of the knees notedpatient does have a significant thigh to calf ratio. More tenderness over the left knee than the right knee today. Ligaments with solid consistent endpoints including ACL, PCL, LCL, MCL. She  does have some instability with valgus stress Negative Mcmurray's, Apley's, and Thessalonian tests. More crepitus on the left side than the right side. Exact opposite from previous exam Patellar and quadriceps tendons unremarkable. Hamstring and quadriceps strength is normal.    After informed written and verbal consent, patient was seated on exam table. Left knee was prepped with alcohol swab and utilizing anterolateral approach, patient's left knee space was injected with 4:1  marcaine 0.5%: Kenalog 40mg /dL. Patient tolerated the procedure well without immediate complications.    Impression and Recommendations:     This case required medical decision making of moderate complexity.

## 2016-05-20 ENCOUNTER — Ambulatory Visit (INDEPENDENT_AMBULATORY_CARE_PROVIDER_SITE_OTHER): Payer: Commercial Managed Care - HMO | Admitting: Family Medicine

## 2016-05-20 ENCOUNTER — Encounter: Payer: Self-pay | Admitting: Family Medicine

## 2016-05-20 VITALS — BP 118/82 | HR 65 | Wt 208.0 lb

## 2016-05-20 DIAGNOSIS — M25561 Pain in right knee: Secondary | ICD-10-CM

## 2016-05-20 DIAGNOSIS — M4726 Other spondylosis with radiculopathy, lumbar region: Secondary | ICD-10-CM | POA: Diagnosis not present

## 2016-05-20 DIAGNOSIS — M5416 Radiculopathy, lumbar region: Secondary | ICD-10-CM

## 2016-05-20 DIAGNOSIS — M1712 Unilateral primary osteoarthritis, left knee: Secondary | ICD-10-CM | POA: Diagnosis not present

## 2016-05-20 DIAGNOSIS — M25562 Pain in left knee: Secondary | ICD-10-CM

## 2016-05-20 MED ORDER — GABAPENTIN 100 MG PO CAPS: 200.0000 mg | ORAL_CAPSULE | Freq: Two times a day (BID) | ORAL | 3 refills | Status: DC

## 2016-05-20 MED ORDER — TRAMADOL HCL 50 MG PO TABS: 50.0000 mg | ORAL_TABLET | Freq: Three times a day (TID) | ORAL | 0 refills | Status: DC | PRN

## 2016-05-20 NOTE — Assessment & Plan Note (Signed)
Left knee injected today. Patient does have end-stage arthritis. Continue the gabapentin, topical anti-inflammatories and tramadol at a low dose. Patient will come back and see me again in 1 month. Patient would be a candidate for Monovisc in the left knee needed.

## 2016-05-20 NOTE — Assessment & Plan Note (Signed)
Patient doing relatively well at this time. Continuing to monitor. If pain comes back before 3 months she would be a candidate for viscous supplementation again in October.

## 2016-05-20 NOTE — Assessment & Plan Note (Signed)
Concerned with patient having radicular symptoms or worsening pain about possible spinal stenosis. Did have a compression fracture. Patient is having worsening symptoms and seems to be having more difficulty with even daily activities. Advance imaging warranted at this time. An MRI ordered. Depending on findings she could be a candidate for an epidural steroid injection or surgical intervention. We will discuss with patient after results.

## 2016-05-20 NOTE — Patient Instructions (Addendum)
Great to see you as always.  You kind of know the drill.  Keep active, and icing.  We will get MRI of your back to rule a nerve being pinched.  Dpending on findings we may be able to get a epidural in the back to help with the pain  We will increase gabapenti nto 200mg  in AM, 200mg  in PM and 300mg  at night See me again in 4 weeks for the knees and if they both hurt we can start monovisc

## 2016-05-21 ENCOUNTER — Other Ambulatory Visit: Payer: Self-pay | Admitting: Internal Medicine

## 2016-05-22 ENCOUNTER — Other Ambulatory Visit: Payer: Self-pay | Admitting: Internal Medicine

## 2016-05-22 MED ORDER — TRIAMCINOLONE ACETONIDE 55 MCG/ACT NA AERO: 2.0000 | INHALATION_SPRAY | Freq: Every day | NASAL | 3 refills | Status: DC

## 2016-05-30 ENCOUNTER — Ambulatory Visit
Admission: RE | Admit: 2016-05-30 | Discharge: 2016-05-30 | Disposition: A | Discharge status: Home / Self Care | Payer: Commercial Managed Care - HMO | Source: Ambulatory Visit | Attending: Family Medicine | Admitting: Family Medicine

## 2016-05-30 DIAGNOSIS — M4806 Spinal stenosis, lumbar region: Secondary | ICD-10-CM | POA: Diagnosis not present

## 2016-05-30 DIAGNOSIS — M5416 Radiculopathy, lumbar region: Secondary | ICD-10-CM

## 2016-06-01 ENCOUNTER — Ambulatory Visit: Payer: Commercial Managed Care - HMO | Admitting: Family Medicine

## 2016-06-16 NOTE — Progress Notes (Signed)
[name] [address], [place] [address] Phone: [phone] Subjective:      CC: Bilateral knee pain follow up Back pain follow-up   [id number]  [name] is a 70 y.o. female coming in with complaint of  bilateral knee pain. 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  Given an injection in the right knee 4 weeks ago. Patient states at the moment both knees seem to be doing relatively well.   Patient was having significant back pain. Was having lower back pain. Sent for x-rays and a possible L1 compression fracture was diagnosed. Patient also had significant arthritic changes. Patient states gabapentin and tramadol has been somewhat helpful but does make her tired. Patient was having worsening symptoms and was sent for advance imaging. MRI was done and independently visualized by me showing significant spinal stenosis that is severe in degree at L3-L4. Patient also has multiple areas of degenerative changes as well as a potential L5 nerve root impingement on the right side.  Patient continues to give radicular symptoms mostly down the right leg. States that they seem to be more posterior lateral. Patient does state that any walking greater than 10 minutes she has to take his feet because she feels that her legs will give out on her. Patient does walk in a flexed position and length to lean on things. Has been anemia waiting with the aid of a cane now. Overall that the back seems to be getting worse. Affecting daily activities.  Past Medical History:  Diagnosis Date  . Allergic rhinitis, cause unspecified   . Allergy    SEASONAL  . Anemia   . Chronic headaches   . Diverticulosis   . DJD (degenerative joint disease), lumbar   . GERD (gastroesophageal reflux disease)   . Hemorrhoids   . Hiatal hernia   . HTN (hypertension)   . Hyperlipidemia   .  Obesity   . Osteoarthritis   . Tubular adenoma of colon    Past Surgical History:  Procedure Laterality Date  . ABLATION    . COLONOSCOPY    . TUBAL LIGATION     Social History  Substance Use Topics  . Smoking status: Never Smoker  . Smokeless tobacco: Never Used  . Alcohol use No   Allergies  Allergen Reactions  . Soybean-Containing Drug Products     Allergy testing positive for soy beans but pt uses soy sauce  . Sulfonamide Derivatives    Family History  Problem Relation Age of Onset  . Heart disease Mother   . Hypertension Mother   . Diabetes Brother   . Colon cancer Neg Hx   . Esophageal cancer Neg Hx   . Rectal cancer Neg Hx   . Stomach cancer Neg Hx        Past medical history, social, surgical and family history all reviewed and no pertinent info pertaining to chief complaint. All other was reviewed using the EMR.    Review of Systems: No headache, visual changes, nausea, vomiting, diarrhea, constipation, dizziness, abdominal pain, skin rash, fevers, chills, night sweats, weight loss, swollen lymph nodes, chest pain, shortness of breath, mood changes.   Objective  Blood pressure 128/84, pulse 71, weight 209 lb (94.8 kg), SpO2 99 %.  General: No apparent distress alert and oriented x3 mood and affect normal, dressed appropriately. Overweight HEENT: Pupils equal, extraocular movements intact  Respiratory: Patient's speak in full sentences and does not appear short of breath  Cardiovascular: No lower extremity edema, non tender, no erythema  Skin: Warm dry intact with no signs of infection or rash on extremities or on axial skeleton.  Abdomen: Soft nontender  Neuro: Cranial nerves II through XII are intact, neurovascularly intact in all extremities with 2+ DTRs and 2+ pulses.  Lymph: No lymphadenopathy of posterior or anterior cervical chain or axillae bilaterally.  Gait antalgic gait still present but improved MSK:  Non tender with full range of motion and good  stability and symmetric strength and tone of shoulders, elbows, wrist, hips bilaterally. Arthritic changes of multiple joints Back exam shows less tenderness than previously. Decreasing range of motion. Patient does have significant tightness of the hamstrings bilaterally. No numbness noted but patient states that there is radicular symptoms with flexing of the hip. Patient's denies any numbness otherwise.  Knee: Bilateral Valgus deformity of the knees notedpatient does have a significant thigh to calf ratio. Mild tenderness over the medial joint line bilaterally. Ligaments with solid consistent endpoints including ACL, PCL, LCL, MCL. She  does have some instability with valgus stress Negative Mcmurray's, Apley's, and Thessalonian tests. Continued crepitus bilaterally Patellar and quadriceps tendons unremarkable. Hamstring and quadriceps strength is normal.      Impression and Recommendations:     This case required medical decision making of moderate complexity.

## 2016-06-17 ENCOUNTER — Ambulatory Visit (INDEPENDENT_AMBULATORY_CARE_PROVIDER_SITE_OTHER): Payer: Commercial Managed Care - HMO | Admitting: Family Medicine

## 2016-06-17 ENCOUNTER — Encounter: Payer: Self-pay | Admitting: Family Medicine

## 2016-06-17 VITALS — BP 128/84 | HR 71 | Wt 209.0 lb

## 2016-06-17 DIAGNOSIS — M4806 Spinal stenosis, lumbar region: Secondary | ICD-10-CM

## 2016-06-17 DIAGNOSIS — M48061 Spinal stenosis, lumbar region without neurogenic claudication: Secondary | ICD-10-CM

## 2016-06-17 DIAGNOSIS — M48062 Spinal stenosis, lumbar region with neurogenic claudication: Secondary | ICD-10-CM | POA: Insufficient documentation

## 2016-06-17 DIAGNOSIS — M5417 Radiculopathy, lumbosacral region: Secondary | ICD-10-CM | POA: Diagnosis not present

## 2016-06-17 DIAGNOSIS — M5416 Radiculopathy, lumbar region: Secondary | ICD-10-CM

## 2016-06-17 NOTE — Patient Instructions (Signed)
God to see you  I really feel a lot of your leg pain is likely coming from your back and spinal stenosis.  We will have another physician do an injection in your back (epidural) to see if this helps out the most. Once you have the injection I would like to see you again 2 weeks after the injection.  Continue the medications you are on at this time and if doing much better after the injection we will start to get you off the medicine.

## 2016-06-17 NOTE — Assessment & Plan Note (Signed)
I do believe the patient is having more of a spinal stenosis. I believe the patient's weakness as well as some the leg pain that we have been treating as knee pain could be from the back. Patient gives history of having increasing fatigue of the lower extremities. No significant atrophy of the musculature. Patient wants to avoid of course any surgical intervention but could be a candidate for an epidural and we will try one at L3-L4. Differential also includes an L5 nerve root impingement and if not better we may need to consider nerve root injection. We will see how patient response and patient will follow-up again in 2 weeks after the injection. Patient will continue all medications.

## 2016-07-07 ENCOUNTER — Ambulatory Visit
Admission: RE | Admit: 2016-07-07 | Discharge: 2016-07-07 | Disposition: A | Discharge status: Home / Self Care | Payer: Commercial Managed Care - HMO | Source: Ambulatory Visit | Attending: Family Medicine | Admitting: Family Medicine

## 2016-07-07 DIAGNOSIS — M545 Low back pain: Secondary | ICD-10-CM | POA: Diagnosis not present

## 2016-07-07 DIAGNOSIS — M48061 Spinal stenosis, lumbar region without neurogenic claudication: Secondary | ICD-10-CM

## 2016-07-07 MED ORDER — METHYLPREDNISOLONE ACETATE 40 MG/ML INJ SUSP (RADIOLOG
120.0000 mg | Freq: Once | INTRAMUSCULAR | Status: AC
  Administered 2016-07-07: 120 mg via EPIDURAL

## 2016-07-07 MED ORDER — IOPAMIDOL (ISOVUE-M 200) INJECTION 41%
1.0000 mL | Freq: Once | INTRAMUSCULAR | Status: AC
  Administered 2016-07-07: 1 mL via EPIDURAL

## 2016-07-07 NOTE — Discharge Instructions (Signed)

## 2016-07-20 ENCOUNTER — Telehealth: Payer: Self-pay

## 2016-07-20 NOTE — Telephone Encounter (Signed)
-----   Message from Algernon Huxley, RN sent at 05/25/2016  3:27 PM EDT ----- Regarding: FW: Labs   ----- Message ----- From: Algernon Huxley, RN Sent: 05/25/2016 To: Algernon Huxley, RN Subject: Labs                                           Pt needs labs, order in epic.

## 2016-07-20 NOTE — Telephone Encounter (Signed)
Pt aware.

## 2016-07-21 ENCOUNTER — Encounter: Payer: Self-pay | Admitting: Family Medicine

## 2016-07-21 ENCOUNTER — Other Ambulatory Visit (INDEPENDENT_AMBULATORY_CARE_PROVIDER_SITE_OTHER): Payer: Commercial Managed Care - HMO

## 2016-07-21 ENCOUNTER — Ambulatory Visit (INDEPENDENT_AMBULATORY_CARE_PROVIDER_SITE_OTHER): Payer: Commercial Managed Care - HMO | Admitting: Family Medicine

## 2016-07-21 DIAGNOSIS — M1712 Unilateral primary osteoarthritis, left knee: Secondary | ICD-10-CM

## 2016-07-21 DIAGNOSIS — M5416 Radiculopathy, lumbar region: Secondary | ICD-10-CM | POA: Diagnosis not present

## 2016-07-21 DIAGNOSIS — D509 Iron deficiency anemia, unspecified: Secondary | ICD-10-CM

## 2016-07-21 DIAGNOSIS — M1710 Unilateral primary osteoarthritis, unspecified knee: Secondary | ICD-10-CM | POA: Diagnosis not present

## 2016-07-21 LAB — CBC WITH DIFFERENTIAL/PLATELET
Basophils Absolute: 0.2 10*3/uL — ABNORMAL HIGH (ref 0.0–0.1)
Basophils Relative: 1.5 % (ref 0.0–3.0)
EOS ABS: 0.3 10*3/uL (ref 0.0–0.7)
EOS PCT: 2.7 % (ref 0.0–5.0)
HCT: 40.8 % (ref 36.0–46.0)
Hemoglobin: 13.9 g/dL (ref 12.0–15.0)
LYMPHS ABS: 2.6 10*3/uL (ref 0.7–4.0)
Lymphocytes Relative: 24.8 % (ref 12.0–46.0)
MCHC: 34.1 g/dL (ref 30.0–36.0)
MCV: 86.9 fl (ref 78.0–100.0)
MONO ABS: 0.8 10*3/uL (ref 0.1–1.0)
Monocytes Relative: 7.7 % (ref 3.0–12.0)
NEUTROS PCT: 63.3 % (ref 43.0–77.0)
Neutro Abs: 6.6 10*3/uL (ref 1.4–7.7)
Platelets: 381 10*3/uL (ref 150.0–400.0)
RBC: 4.7 Mil/uL (ref 3.87–5.11)
RDW: 13.7 % (ref 11.5–15.5)
WBC: 10.5 10*3/uL (ref 4.0–10.5)

## 2016-07-21 LAB — IBC PANEL
IRON: 78 ug/dL (ref 42–145)
Saturation Ratios: 21.3 % (ref 20.0–50.0)
TRANSFERRIN: 262 mg/dL (ref 212.0–360.0)

## 2016-07-21 LAB — FERRITIN: FERRITIN: 35.7 ng/mL (ref 10.0–291.0)

## 2016-07-21 MED ORDER — GABAPENTIN 300 MG PO CAPS: 300.0000 mg | ORAL_CAPSULE | Freq: Three times a day (TID) | ORAL | 3 refills | Status: DC

## 2016-07-21 NOTE — Assessment & Plan Note (Signed)
Continue to monitor. Worsening symptoms she would be a candidate for another corticosteroid injection.

## 2016-07-21 NOTE — Patient Instructions (Signed)
Great to see you  I am glad overall we are doing a little better Gabepentin 300mg  3 times a day  Keep watching the knees.  If back is worse in 2 weeks call me and we will consider another injection in your back  See me agai in  6 weeks in case knees are worse.

## 2016-07-21 NOTE — Assessment & Plan Note (Signed)
Continue to monitor

## 2016-07-21 NOTE — Progress Notes (Signed)
[name] [place], [place] [address] Phone: [phone] Subjective:      CC: Bilateral knee pain follow up Back pain follow-up   [id number]  [name] is a 70 y.o. female coming in with complaint of  bilateral knee pain. 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  Patient feels like she is doing well. Was given an epidural for spinal stenosis previously. Patient states that that helped the knee pain somewhat. Still having mild instability and mild pain but nothing as severe as what it was.   Patient was having significant back pain. Was having lower back pain. Sent for x-rays and a possible L1 compression fracture was diagnosed.  MRI was done and independently visualized by me showing significant spinal stenosis that is severe in degree at L3-L4. Patient also has multiple areas of degenerative changes as well as a potential L5 nerve root impingement on the right side. Patient was given an epidural 2 weeks ago. Patient states that the pain in the legs has improved. Has some increase in strength. Still having the radicular symptoms going down the posterior lateral aspect the leg stopping at the knee. Patient states that the gabapentin has been helpful with [place] increasing to 200 mg in the morning and afternoon in 300 mg at night. No significant side effects. Would like of course to be pain-free or still better. Patient though is able to do daily activities at this time much more frequently and efficiently.   Past Medical History:  Diagnosis Date  . Allergic rhinitis, cause unspecified   . Allergy    SEASONAL  . Anemia   . Chronic headaches   . Diverticulosis   . DJD (degenerative joint disease), lumbar   . GERD (gastroesophageal reflux disease)   . Hemorrhoids   . Hiatal hernia   . HTN (hypertension)   . Hyperlipidemia   . Obesity   .  Osteoarthritis   . Tubular adenoma of colon    Past Surgical History:  Procedure Laterality Date  . ABLATION    . COLONOSCOPY    . TUBAL LIGATION     Social History  Substance Use Topics  . Smoking status: Never Smoker  . Smokeless tobacco: Never Used  . Alcohol use No   Allergies  Allergen Reactions  . Soybean-Containing Drug Products     Allergy testing positive for soy beans but pt uses soy sauce  . Sulfonamide Derivatives    Family History  Problem Relation Age of Onset  . Heart disease Mother   . Hypertension Mother   . Diabetes Brother   . Colon cancer Neg Hx   . Esophageal cancer Neg Hx   . Rectal cancer Neg Hx   . Stomach cancer Neg Hx        Past medical history, social, surgical and family history all reviewed and no pertinent info pertaining to chief complaint. All other was reviewed using the EMR.    Review of Systems: No headache, visual changes, nausea, vomiting, diarrhea, constipation, dizziness, abdominal pain, skin rash, fevers, chills, night sweats, weight loss, swollen lymph nodes, chest pain, shortness of breath, mood changes.   Objective  Blood pressure 126/78, pulse (!) 56, weight 207 lb 12.8 oz (94.3 kg).  General: No apparent distress alert and oriented x3 mood and affect normal, dressed appropriately. Overweight HEENT: Pupils equal, extraocular movements intact  Respiratory: Patient's  speak in full sentences and does not appear short of breath  Cardiovascular: No lower extremity edema, non tender, no erythema  Skin: Warm dry intact with no signs of infection or rash on extremities or on axial skeleton.  Abdomen: Soft nontender  Neuro: Cranial nerves II through XII are intact, neurovascularly intact in all extremities with 2+ DTRs and 2+ pulses.  Lymph: No lymphadenopathy of posterior or anterior cervical chain or axillae bilaterally.  Gait antalgic gait still present but improved MSK:  Non tender with full range of motion and good stability  and symmetric strength and tone of shoulders, elbows, wrist, hips bilaterally. Arthritic changes of multiple joints Back exam shows less tenderness than previously. Decreasing range of motion.Still mild tightness on the right leg or a positive straight leg test. Still minor tenderness to palpation in the paraspinal musculature of the lumbar spine on the right side. No weakness noted. Neurovascular intact distally.  Knee: Bilateral Valgus deformity of the knees noted patient does have a significant thigh to calf ratio. Mild tenderness over the medial joint line bilaterally less than previous exam  She  does have some instability with valgus stress Negative Mcmurray's, Apley's, and Thessalonian tests. Continued crepitus bilaterally Patellar and quadriceps tendons unremarkable. Hamstring and quadriceps strength is normal.       Impression and Recommendations:     This case required medical decision making of moderate complexity.

## 2016-07-21 NOTE — Assessment & Plan Note (Signed)
Improved after the epidural to certain degree. Still concerned with patient may need an L5 nerve root block. Patient at the moment does not want to do anything more aggressive at this time. We discussed with patient at great length and we will increase patient's gabapentin to 300 g. We discussed continuing to increase activity. Patient declined formal physical therapy again. We will continue to monitor for any change in symptoms. Follow-up again in 6 weeks. Worsening symptoms she will call and we will schedule a nerve root block. Patient does have tramadol for breakthrough pain.  Spent  25 minutes with patient face-to-face and had greater than 50% of counseling including as described above in assessment and plan.

## 2016-07-27 DIAGNOSIS — H01024 Squamous blepharitis left upper eyelid: Secondary | ICD-10-CM | POA: Diagnosis not present

## 2016-07-27 DIAGNOSIS — H01022 Squamous blepharitis right lower eyelid: Secondary | ICD-10-CM | POA: Diagnosis not present

## 2016-07-27 DIAGNOSIS — H01025 Squamous blepharitis left lower eyelid: Secondary | ICD-10-CM | POA: Diagnosis not present

## 2016-07-27 DIAGNOSIS — H10413 Chronic giant papillary conjunctivitis, bilateral: Secondary | ICD-10-CM | POA: Diagnosis not present

## 2016-07-27 DIAGNOSIS — H01021 Squamous blepharitis right upper eyelid: Secondary | ICD-10-CM | POA: Diagnosis not present

## 2016-07-27 DIAGNOSIS — H2513 Age-related nuclear cataract, bilateral: Secondary | ICD-10-CM | POA: Diagnosis not present

## 2016-07-31 ENCOUNTER — Other Ambulatory Visit: Payer: Self-pay | Admitting: Internal Medicine

## 2016-08-10 ENCOUNTER — Telehealth: Payer: Self-pay | Admitting: *Deleted

## 2016-08-10 DIAGNOSIS — M48062 Spinal stenosis, lumbar region with neurogenic claudication: Secondary | ICD-10-CM

## 2016-08-10 MED ORDER — PREDNISONE 20 MG PO TABS: 30.0000 mg | ORAL_TABLET | Freq: Every day | ORAL | 0 refills | Status: DC

## 2016-08-10 NOTE — Telephone Encounter (Signed)
Lets order another epidural now. L4/5  Prednsone daily for 5 days and I sent it in.   Please call patient.

## 2016-08-10 NOTE — Telephone Encounter (Signed)
Left msg on triage requesting to speak w/Dr. Tamala Julian she is requesting something for the pain that she is having going down her leg & lower back...[name]

## 2016-08-10 NOTE — Telephone Encounter (Signed)
Discussed with pt. Epidural entered & sent to [facility].

## 2016-08-11 ENCOUNTER — Ambulatory Visit (INDEPENDENT_AMBULATORY_CARE_PROVIDER_SITE_OTHER): Payer: Commercial Managed Care - HMO

## 2016-08-11 DIAGNOSIS — Z23 Encounter for immunization: Secondary | ICD-10-CM | POA: Diagnosis not present

## 2016-08-14 ENCOUNTER — Other Ambulatory Visit: Payer: Self-pay | Admitting: Internal Medicine

## 2016-08-14 ENCOUNTER — Ambulatory Visit
Admission: RE | Admit: 2016-08-14 | Discharge: 2016-08-14 | Disposition: A | Discharge status: Home / Self Care | Payer: Commercial Managed Care - HMO | Source: Ambulatory Visit | Attending: Family Medicine | Admitting: Family Medicine

## 2016-08-14 DIAGNOSIS — M48062 Spinal stenosis, lumbar region with neurogenic claudication: Secondary | ICD-10-CM

## 2016-08-14 DIAGNOSIS — M545 Low back pain: Secondary | ICD-10-CM | POA: Diagnosis not present

## 2016-08-14 MED ORDER — IOPAMIDOL (ISOVUE-M 200) INJECTION 41%
1.0000 mL | Freq: Once | INTRAMUSCULAR | Status: AC
  Administered 2016-08-14: 1 mL via EPIDURAL

## 2016-08-14 MED ORDER — METHYLPREDNISOLONE ACETATE 40 MG/ML INJ SUSP (RADIOLOG
120.0000 mg | Freq: Once | INTRAMUSCULAR | Status: AC
Start: 2016-08-14 — End: 2016-08-14
  Administered 2016-08-14: 120 mg via EPIDURAL

## 2016-08-14 NOTE — Discharge Instructions (Signed)

## 2016-08-17 ENCOUNTER — Telehealth: Payer: Self-pay | Admitting: Family Medicine

## 2016-08-17 NOTE — Telephone Encounter (Signed)
She had an epidural done on 10.27.17. Does she need to come back in for a f/u?

## 2016-08-17 NOTE — Telephone Encounter (Signed)
Lets check in again in 2-4 weeks

## 2016-08-17 NOTE — Telephone Encounter (Signed)
Patient states Dr. Tamala Julian referred her for a procedure.  She would like a call back to confirm when she needs to come back to see Dr. Tamala Julian.

## 2016-08-18 NOTE — Telephone Encounter (Signed)
Discussed with pt

## 2016-08-20 ENCOUNTER — Ambulatory Visit: Payer: Commercial Managed Care - HMO

## 2016-08-24 ENCOUNTER — Telehealth: Payer: Self-pay | Admitting: Internal Medicine

## 2016-08-24 NOTE — Telephone Encounter (Signed)
Called and spoke to pt. Appt made with CY on 12/02/16. Pt verbalized understanding and denied any further questions or concerns at this time.

## 2016-08-24 NOTE — Telephone Encounter (Signed)
Last OV: 10/17/15; Pt can be put in next opening appt in Feb 2018-okay to use RNA slots if needed. Thanks.

## 2016-08-25 NOTE — Progress Notes (Signed)
[name] [name], [name] [address] Phone: [phone] Subjective:      CC: Bilateral knee pain follow up Back pain follow-up   [id number]  [name] is a 70 y.o. female coming in with complaint of  bilateral knee pain. 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  Last steroid injection in the left knee was given 04/24/2016. Patient states that this seemed to be doing very well. No significant worsening of the pain. Still mild instability but nothing that is stopping her from activity.   Patient was having significant back pain. Was having lower back pain. Sent for x-rays and a possible L1 compression fracture was diagnosed.  MRI was done and independently visualized by me showing significant spinal stenosis that is severe in degree at L3-L4. Patient also has multiple areas of degenerative changes as well as a potential L5 nerve root impingement on the right side. Patient was given an epidural previously and was making progress. Patient then had some worsening symptoms and was sent for another epidural injection on 08/14/2016. Patient is here for follow-up. Patient states feeling much better at this time. Still very minimal amount of pain on the right side. Nothing as severe as what it was previously. Patient is happy with the results of far.   Past Medical History:  Diagnosis Date  . Allergic rhinitis, cause unspecified   . Allergy    SEASONAL  . Anemia   . Chronic headaches   . Diverticulosis   . DJD (degenerative joint disease), lumbar   . GERD (gastroesophageal reflux disease)   . Hemorrhoids   . Hiatal hernia   . HTN (hypertension)   . Hyperlipidemia   . Obesity   . Osteoarthritis   . Tubular adenoma of colon    Past Surgical History:  Procedure Laterality Date  . ABLATION    . COLONOSCOPY    . TUBAL LIGATION     Social  History  Substance Use Topics  . Smoking status: Never Smoker  . Smokeless tobacco: Never Used  . Alcohol use No   Allergies  Allergen Reactions  . 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 Sulfonamide Derivatives Itching and Rash   Family History  Problem Relation Age of Onset  . Heart disease Mother   . Hypertension Mother   . Diabetes Brother   . Colon cancer Neg Hx   . Esophageal cancer Neg Hx   . Rectal cancer Neg Hx   . Stomach cancer Neg Hx        Past medical history, social, surgical and family history all reviewed and no pertinent info pertaining to chief complaint. All other was reviewed using the EMR.    Review of Systems: No headache, visual changes, nausea, vomiting, diarrhea, constipation, dizziness, abdominal pain, skin rash, fevers, chills, night sweats, weight loss, swollen lymph nodes, chest pain, shortness of breath, mood changes.   Objective  Blood pressure 116/82, pulse 61, height 5\' 3"  (1.6 m), weight 207 lb (93.9 kg), SpO2 97 %.  Systems examined below as of 08/26/16 General: NAD A&O x3 mood, affect normal  HEENT: Pupils equal, extraocular movements intact no nystagmus Respiratory: not short of breath at rest or with speaking Cardiovascular: No lower extremity edema, non tender Skin: Warm dry intact with no signs of  infection or rash on extremities or on axial skeleton. Abdomen: Soft nontender, no masses Neuro: Cranial nerves  intact, neurovascularly intact in all extremities with 2+ DTRs and 2+ pulses. Lymph: No lymphadenopathy appreciated today  Gait antalgic gait But improved MSK:  Non tender with full range of motion and good stability and symmetric strength and tone of shoulders, elbows, wrist, hips bilaterally. Arthritic changes of multiple joints Back exam shows less tenderness than previously. Continue mild limitation of range of motion. Still has some tightness  with the straight leg test on the right side. Mild radiation at this time in the L5 distribution. Patient has difficulty with Corky Sox test secondary to tightness and patient's knee pain. Neurovascularly intact distally. No loss of strength.  Knee: Bilateral Valgus deformity of the knees noted patient does have a significant thigh to calf ratio. Instability still noted with valgus force less tender than previous exam Negative Mcmurray's, Apley's, and Thessalonian tests. Continued crepitus bilaterally Patellar and quadriceps tendons unremarkable. Hamstring and quadriceps strength is normal.       Impression and Recommendations:     This case required medical decision making of moderate complexity.

## 2016-08-26 ENCOUNTER — Encounter: Payer: Self-pay | Admitting: Family Medicine

## 2016-08-26 ENCOUNTER — Ambulatory Visit (INDEPENDENT_AMBULATORY_CARE_PROVIDER_SITE_OTHER): Payer: Commercial Managed Care - HMO | Admitting: Family Medicine

## 2016-08-26 DIAGNOSIS — M48062 Spinal stenosis, lumbar region with neurogenic claudication: Secondary | ICD-10-CM | POA: Diagnosis not present

## 2016-08-26 DIAGNOSIS — M1712 Unilateral primary osteoarthritis, left knee: Secondary | ICD-10-CM | POA: Diagnosis not present

## 2016-08-26 NOTE — Patient Instructions (Signed)
God to see you  I am so happy you are doing well  Add 500mg  of vitamin C to your iron.  See me again when you need me or call me if you think you need another epidural.

## 2016-08-26 NOTE — Assessment & Plan Note (Signed)
Stable, continue current therapy

## 2016-08-26 NOTE — Assessment & Plan Note (Signed)
Still moment. Patient does have an L5 nerve root impingement and this continues and may want to consider an nerve root injection. Patient overall has made improvement with 2 epidurals. We discussed we can repeat up to 3 and a 12 month period. Patient wants to avoid any type of surgery. Follow-up as needed

## 2016-09-01 ENCOUNTER — Ambulatory Visit: Payer: Commercial Managed Care - HMO

## 2016-09-01 ENCOUNTER — Ambulatory Visit: Payer: Commercial Managed Care - HMO | Admitting: Family Medicine

## 2016-09-02 ENCOUNTER — Telehealth: Payer: Self-pay | Admitting: Emergency Medicine

## 2016-09-02 NOTE — Telephone Encounter (Signed)
Discussed with pt

## 2016-09-02 NOTE — Telephone Encounter (Signed)
Yes tell her to pump some iron.  Start low and go slow!

## 2016-09-02 NOTE — Telephone Encounter (Signed)
Pt called and wants to know if she can start going back to the gym. If so please give her a call back. Thanks.

## 2016-09-14 ENCOUNTER — Telehealth: Payer: Self-pay | Admitting: Internal Medicine

## 2016-09-14 NOTE — Telephone Encounter (Signed)
Pt states she is having nasal drainage-watery, headache, cough-non productive. Pt takes Zyrtec and Astelin NS at bedtime.  Would like to have other recs    Per CY-pt can try the OTC Tylenol cold and sinus-something similar to that approach to see if this helps-seems to be viral cold that does not require abx at this time If it turns to any color then contact the office to get abx.  Pt aware of recs from CY and will call back if color appears.

## 2016-09-15 NOTE — Progress Notes (Signed)
[name] [place], [place] [address] Phone: [phone] Subjective:      CC: Bilateral knee pain follow up Back pain follow-up   [id number]  [name] is a 70 y.o. female coming in with complaint of  bilateral knee pain. 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  Last steroid injection in the left knee was given 04/24/2016. Patient states Worsening pain at this time. Affecting daily activities. Difficult to even go up or down one flight of stairs. Patient has to stop multiple times. Having pain even walking greater than 100 feet.   Patient was having significant back pain. Was having lower back pain. Sent for x-rays and a possible L1 compression fracture was diagnosed.  MRI was done and independently visualized by me showing significant spinal stenosis that is severe in degree at L3-L4. Patient also has multiple areas of degenerative changes as well as a potential L5 nerve root impingement on the right side. Patient was given an epidural previously and was making progress. Patient then had some worsening symptoms and was sent for another epidural injection on 08/14/2016. Was doing well at last follow-up. Patient states Still giving her some discomfort. Still on the right side. No worsening pain but no significant improvement.   Past Medical History:  Diagnosis Date  . Allergic rhinitis, cause unspecified   . Allergy    SEASONAL  . Anemia   . Chronic headaches   . Diverticulosis   . DJD (degenerative joint disease), lumbar   . GERD (gastroesophageal reflux disease)   . Hemorrhoids   . Hiatal hernia   . HTN (hypertension)   . Hyperlipidemia   . Obesity   . Osteoarthritis   . Tubular adenoma of colon    Past Surgical History:  Procedure Laterality Date  . ABLATION    . COLONOSCOPY    . TUBAL LIGATION     Social History  Substance  Use Topics  . Smoking status: Never Smoker  . Smokeless tobacco: Never Used  . Alcohol use No   Allergies  Allergen Reactions  . 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 Sulfonamide Derivatives Itching and Rash   Family History  Problem Relation Age of Onset  . Heart disease Mother   . Hypertension Mother   . Diabetes Brother   . Colon cancer Neg Hx   . Esophageal cancer Neg Hx   . Rectal cancer Neg Hx   . Stomach cancer Neg Hx        Past medical history, social, surgical and family history all reviewed and no pertinent info pertaining to chief complaint. All other was reviewed using the EMR.    Review of Systems: No headache, visual changes, nausea, vomiting, diarrhea, constipation, dizziness, abdominal pain, skin rash, fevers, chills, night sweats, weight loss, swollen lymph nodes, chest pain, shortness of breath, mood changes.   Objective  Blood pressure 110/76, height 5\' 3"  (1.6 m), weight 210 lb (95.3 kg).  Systems examined below as of 09/16/16 General: NAD A&O x3 mood, affect normal  HEENT: Pupils equal, extraocular movements intact no nystagmus Respiratory: not short of breath at rest or with speaking Cardiovascular: No lower extremity edema, non tender Skin: Warm dry intact with no signs of infection or rash on extremities or on axial skeleton. Abdomen: Soft  nontender, no masses Neuro: Cranial nerves  intact, neurovascularly intact in all extremities with 2+ DTRs and 2+ pulses. Lymph: No lymphadenopathy appreciated today  Gait antalgic gait with mild worsening MSK:  Non tender with full range of motion and good stability and symmetric strength and tone of shoulders, elbows, wrist, hips bilaterally. Arthritic changes of multiple joints Back exam shows less tenderness than previously. Continue mild limitation of range of motion. Still has some tightness with the straight leg test on the  right side. Mild radiation at this time in the L5 distribution. Patient has difficulty with Corky Sox test secondary to tightness and patient's knee pain. Neurovascularly intact distally. No loss of strength.  Knee: Bilateral Valgus deformity of the knees noted patient does have a significant thigh to calf ratio. Instability still noted with valgus force less tender than previous exam Negative Mcmurray's, Apley's, and Thessalonian tests. Continued crepitus bilaterally Patellar and quadriceps tendons unremarkable. Hamstring and quadriceps strength is normal.   After informed written and verbal consent, patient was seated on exam table. Right knee was prepped with alcohol swab and utilizing anterolateral approach, patient's right knee space was injected with 4:1  marcaine 0.5%: Kenalog 40mg /dL. Patient tolerated the procedure well without immediate complications.  After informed written and verbal consent, patient was seated on exam table. Left knee was prepped with alcohol swab and utilizing anterolateral approach, patient's left knee space was injected with 4:1  marcaine 0.5%: Kenalog 40mg /dL. Patient tolerated the procedure well without immediate complications.    Impression and Recommendations:     This case required medical decision making of moderate complexity.

## 2016-09-16 ENCOUNTER — Ambulatory Visit (INDEPENDENT_AMBULATORY_CARE_PROVIDER_SITE_OTHER): Payer: Commercial Managed Care - HMO | Admitting: Family Medicine

## 2016-09-16 ENCOUNTER — Encounter: Payer: Self-pay | Admitting: Family Medicine

## 2016-09-16 DIAGNOSIS — M48062 Spinal stenosis, lumbar region with neurogenic claudication: Secondary | ICD-10-CM

## 2016-09-16 DIAGNOSIS — M170 Bilateral primary osteoarthritis of knee: Secondary | ICD-10-CM | POA: Diagnosis not present

## 2016-09-16 MED ORDER — DULOXETINE HCL 20 MG PO CPEP: 20.0000 mg | ORAL_CAPSULE | Freq: Every day | ORAL | 3 refills | Status: DC

## 2016-09-16 NOTE — Assessment & Plan Note (Signed)
Patient given bilateral injections today. Encourage her to continue the conservative therapy and home exercises. Warned the potential side effects. Patient will continue the other medications. Will do topical anti-inflammatories if it is helpful. We discussed icing regimen. We'll here for a limiting that is curative would be replacement. Can repeat every 10 weeks if necessary.

## 2016-09-16 NOTE — Patient Instructions (Addendum)
Good to see you  [name] is your friend.  You know the drill  Can repeat injections every 10 weeks if we need  Happy holidays!  Stop the sertline and start cymbalta See you again next year ;)

## 2016-10-05 ENCOUNTER — Telehealth: Payer: Self-pay | Admitting: Family Medicine

## 2016-10-05 NOTE — Telephone Encounter (Signed)
Spoke to pt, scheduled her for 945am on 12.20.17.

## 2016-10-05 NOTE — Telephone Encounter (Signed)
Patient states she is having issues with her arm again.  States that she will be out of town the week of Christmas.  I have her scheduled for 1/2.  She would like to know what she can do at home to get her through until her appt.

## 2016-10-06 NOTE — Progress Notes (Signed)
[name] [place], [place] [address] Phone: [phone] Subjective:      CC: Bilateral knee pain follow up Left shoulder pain   [id number]  [name] is a 70 y.o. female coming in with complaint of  bilateral knee pain. 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  Last steroid injection in the left knee was given 04/24/2016. Left knee injections given November 29. In addition to this patient states that she is doing relatively well and seems to be stable.   Patient was having significant back pain. Was having lower back pain. Sent for x-rays and a possible L1 compression fracture was diagnosed.  MRI was done and independently visualized by me showing significant spinal stenosis that is severe in degree at L3-L4. Patient also has multiple areas of degenerative changes as well as a potential L5 nerve root impingement on the right side. Patient states that back seems to be doing well and is stable at this time.  Patient is also complaining of left shoulder pain. New problem. States that she has had a bursitis many years ago. Feels very similar. In certain range of motion gives her more discomfort. Patient states it is waking her up at night. No radiation down the arm. No significant neck pain. Seems to be localized. Patient denies any numbness or weakness in the hand.   Past Medical History:  Diagnosis Date  . Allergic rhinitis, cause unspecified   . Allergy    SEASONAL  . Anemia   . Chronic headaches   . Diverticulosis   . DJD (degenerative joint disease), lumbar   . GERD (gastroesophageal reflux disease)   . Hemorrhoids   . Hiatal hernia   . HTN (hypertension)   . Hyperlipidemia   . Obesity   . Osteoarthritis   . Tubular adenoma of colon    Past Surgical History:  Procedure Laterality Date  . ABLATION    . COLONOSCOPY    . TUBAL  LIGATION     Social History  Substance Use Topics  . Smoking status: Never Smoker  . Smokeless tobacco: Never Used  . Alcohol use No   Allergies  Allergen Reactions  . 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 Sulfonamide Derivatives Itching and Rash   Family History  Problem Relation Age of Onset  . Heart disease Mother   . Hypertension Mother   . Diabetes Brother   . Colon cancer Neg Hx   . Esophageal cancer Neg Hx   . Rectal cancer Neg Hx   . Stomach cancer Neg Hx        Past medical history, social, surgical and family history all reviewed and no pertinent info pertaining to chief complaint. All other was reviewed using the EMR.    Review of Systems: No headache, visual changes, nausea, vomiting, diarrhea, constipation, dizziness, abdominal pain, skin rash, fevers, chills, night sweats, weight loss, swollen lymph nodes, chest pain, shortness of breath, mood changes.   Objective  Blood pressure 126/84, pulse 73, height 5\' 3"  (1.6 m), weight 212 lb (96.2 kg), SpO2 95 %.  Systems examined below as of 10/07/16 General: NAD A&O x3 mood, affect normal  HEENT: Pupils equal, extraocular movements intact no nystagmus Respiratory: not short of breath at rest or with speaking Cardiovascular: No lower extremity  edema, non tender Skin: Warm dry intact with no signs of infection or rash on extremities or on axial skeleton. Abdomen: Soft nontender, no masses Neuro: Cranial nerves  intact, neurovascularly intact in all extremities with 2+ DTRs and 2+ pulses. Lymph: No lymphadenopathy appreciated today  Gait antalgic gait Noted MSK:  Non tender with full range of motion and good stability and symmetric strength and tone of shoulders, elbows, wrist, hips bilaterally. Arthritic changes of multiple joints Back exam Minimal tenderness. Continue mild limitation of range of motion. Still discomfort during straight  leg test but no significant radicular symptoms on the right side. Mild radiation at this time in the L5 distribution. Patient has difficulty with Corky Sox test secondary to tightness and patient's knee pain. Neurovascularly intact distally. No loss of strength.  Shoulder: left Inspection reveals no abnormalities, atrophy or asymmetry. Palpation is normal with no tenderness over AC joint or bicipital groove. ROM is full in all planes passively. Rotator cuff strength normal throughout. signs of impingement with positive Neer and Hawkin's tests, but negative empty can sign. Speeds and Yergason's tests normal. No labral pathology noted with negative Obrien's, negative clunk and good stability. Normal scapular function observed. No painful arc and no drop arm sign. No apprehension sign  MSK US performed of: left This study was ordered, performed, and interpreted by Charlann Boxer D.O.  Shoulder:   Supraspinatus:  Appears normal on long and transverse views, Bursal bulge seen with shoulder abduction on impingement view. Infraspinatus:  Appears normal on long and transverse views. Significant increase in Doppler flow Subscapularis:  Appears normal on long and transverse views. Positive bursa Teres Minor:  Appears normal on long and transverse views. AC joint:  Capsule undistended, no geyser sign. Glenohumeral Joint:  Moderate arthritic changes Glenoid Labrum:  Intact without visualized tears. Biceps Tendon:  Appears normal on long and transverse views, no fraying of tendon, tendon located in intertubercular groove, no subluxation with shoulder internal or external rotation.  Impression: Subacromial bursitis moderate underlying arthritic changes  Procedure: Real-time Ultrasound Guided Injection of left glenohumeral joint Device: GE Logiq E  Ultrasound guided injection is preferred based studies that show increased duration, increased effect, greater accuracy, decreased procedural pain, increased  response rate with ultrasound guided versus blind injection.  Verbal informed consent obtained.  Time-out conducted.  Noted no overlying erythema, induration, or other signs of local infection.  Skin prepped in a sterile fashion.  Local anesthesia: Topical Ethyl chloride.  With sterile technique and under real time ultrasound guidance:  Joint visualized.  23g 1  inch needle inserted posterior approach. Pictures taken for needle placement. Patient did have injection of 2 cc of 1% lidocaine, 2 cc of 0.5% Marcaine, and 1.0 cc of Kenalog 40 mg/dL. Completed without difficulty  Pain immediately resolved suggesting accurate placement of the medication.  Advised to call if fevers/chills, erythema, induration, drainage, or persistent bleeding.  Images permanently stored and available for review in the ultrasound unit.  Impression: Technically successful ultrasound guided injection.     Impression and Recommendations:     This case required medical decision making of moderate complexity.

## 2016-10-07 ENCOUNTER — Ambulatory Visit: Payer: Self-pay

## 2016-10-07 ENCOUNTER — Ambulatory Visit (INDEPENDENT_AMBULATORY_CARE_PROVIDER_SITE_OTHER): Payer: Commercial Managed Care - HMO | Admitting: Family Medicine

## 2016-10-07 ENCOUNTER — Encounter: Payer: Self-pay | Admitting: Family Medicine

## 2016-10-07 VITALS — BP 126/84 | HR 73 | Ht 63.0 in | Wt 212.0 lb

## 2016-10-07 DIAGNOSIS — M79601 Pain in right arm: Secondary | ICD-10-CM | POA: Diagnosis not present

## 2016-10-07 DIAGNOSIS — M19012 Primary osteoarthritis, left shoulder: Secondary | ICD-10-CM | POA: Diagnosis not present

## 2016-10-07 NOTE — Assessment & Plan Note (Signed)
Patient given injection today and tolerated the procedure well. We discussed icing regimen and home exercises. Discussed which activities to do a which was to avoid. Topical anti-inflammatories and icing suggested. Follow-up again in 4-6 weeks.

## 2016-10-07 NOTE — Patient Instructions (Signed)
God to see you  Ice 20 minutes 2 times daily. Usually after activity and before bed. Try to keep hands within peripheral visoni.  Avoid heavy lifting if you can.  See me again in 4 weeks Happy holidays!  Happy New Year!

## 2016-10-13 ENCOUNTER — Other Ambulatory Visit: Payer: Self-pay | Admitting: Internal Medicine

## 2016-10-16 ENCOUNTER — Ambulatory Visit: Payer: Commercial Managed Care - HMO | Admitting: Internal Medicine

## 2016-10-19 NOTE — Progress Notes (Deleted)
[name] [place], [place] [address] Phone: [phone] Subjective:      CC: Bilateral knee pain follow up Left shoulder pain f/u   [id number]  [name] is a 71 y.o. female coming in with complaint of  bilateral knee pain. 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  Last steroid injection in the left knee was given 04/24/2016. Left knee injections given November 29. In addition to this patient states that she is doing relatively well and seems to be stable.   Patient was having significant back pain. Was having lower back pain. Sent for x-rays and a possible L1 compression fracture was diagnosed.  MRI was done and independently visualized by me showing significant spinal stenosis that is severe in degree at L3-L4. Patient also has multiple areas of degenerative changes as well as a potential L5 nerve root impingement on the right side. Patient states that back seems to be doing well and is stable at this time.  Patient is also complaining of left shoulder pain. Has now had 2 injections in the left shoulder within the last 3 months. Patient states   Past Medical History:  Diagnosis Date  . Allergic rhinitis, cause unspecified   . Allergy    SEASONAL  . Anemia   . Chronic headaches   . Diverticulosis   . DJD (degenerative joint disease), lumbar   . GERD (gastroesophageal reflux disease)   . Hemorrhoids   . Hiatal hernia   . HTN (hypertension)   . Hyperlipidemia   . Obesity   . Osteoarthritis   . Tubular adenoma of colon    Past Surgical History:  Procedure Laterality Date  . ABLATION    . COLONOSCOPY    . TUBAL LIGATION     Social History  Substance Use Topics  . Smoking status: Never Smoker  . Smokeless tobacco: Never Used  . Alcohol use No   Allergies  Allergen Reactions  . Soybean-Containing Drug Products Other (See  Comments)    Allergy testing positive for soy beans but pt uses soy sauce  . Sulfonamide Derivatives Itching and Rash   Family History  Problem Relation Age of Onset  . Heart disease Mother   . Hypertension Mother   . Diabetes Brother   . Colon cancer Neg Hx   . Esophageal cancer Neg Hx   . Rectal cancer Neg Hx   . Stomach cancer Neg Hx        Past medical history, social, surgical and family history all reviewed and no pertinent info pertaining to chief complaint. All other was reviewed using the EMR.    Review of Systems: No headache, visual changes, nausea, vomiting, diarrhea, constipation, dizziness, abdominal pain, skin rash, fevers, chills, night sweats, weight loss, swollen lymph nodes, chest pain, shortness of breath, mood changes.   Objective  There were no vitals taken for this visit.  Systems examined below as of 10/19/16 General: NAD A&O x3 mood, affect normal  HEENT: Pupils equal, extraocular movements intact no nystagmus Respiratory: not short of breath at rest or with speaking Cardiovascular: No lower extremity edema, non tender Skin: Warm dry intact with no signs of infection or rash on extremities or on axial skeleton. Abdomen: Soft nontender, no masses Neuro: Cranial nerves  intact, neurovascularly intact in all extremities with 2+ DTRs and 2+ pulses. Lymph: No lymphadenopathy appreciated today  Gait  antalgic gait Noted MSK:  Non tender with full range of motion and good stability and symmetric strength and tone of shoulders, elbows, wrist, hips bilaterally. Arthritic changes of multiple joints Back exam Minimal tenderness. Continue mild limitation of range of motion. Still discomfort during straight leg test but no significant radicular symptoms on the right side. Mild radiation at this time in the L5 distribution. Patient has difficulty with Corky Sox test secondary to tightness and patient's knee pain. Neurovascularly intact distally. No loss of  strength.  Shoulder: left Inspection reveals no abnormalities, atrophy or asymmetry. Palpation is normal with no tenderness over AC joint or bicipital groove. ROM is full in all planes passively. Rotator cuff strength normal throughout. signs of impingement with positive Neer and Hawkin's tests, but negative empty can sign. Speeds and Yergason's tests normal. No labral pathology noted with negative Obrien's, negative clunk and good stability. Normal scapular function observed. No painful arc and no drop arm sign. No apprehension sign  MSK US performed of: left This study was ordered, performed, and interpreted by Charlann Boxer D.O.  Shoulder:   Supraspinatus:  Appears normal on long and transverse views, Bursal bulge seen with shoulder abduction on impingement view. Infraspinatus:  Appears normal on long and transverse views. Significant increase in Doppler flow Subscapularis:  Appears normal on long and transverse views. Positive bursa Teres Minor:  Appears normal on long and transverse views. AC joint:  Capsule undistended, no geyser sign. Glenohumeral Joint:  Moderate arthritic changes Glenoid Labrum:  Intact without visualized tears. Biceps Tendon:  Appears normal on long and transverse views, no fraying of tendon, tendon located in intertubercular groove, no subluxation with shoulder internal or external rotation.  Impression: Subacromial bursitis moderate underlying arthritic changes     Impression and Recommendations:     This case required medical decision making of moderate complexity.

## 2016-10-20 ENCOUNTER — Ambulatory Visit: Payer: Commercial Managed Care - HMO | Admitting: Family Medicine

## 2016-10-28 ENCOUNTER — Ambulatory Visit (INDEPENDENT_AMBULATORY_CARE_PROVIDER_SITE_OTHER): Payer: Medicare HMO | Admitting: Nurse Practitioner

## 2016-10-28 ENCOUNTER — Encounter: Payer: Self-pay | Admitting: Nurse Practitioner

## 2016-10-28 VITALS — BP 140/80 | HR 80 | Temp 98.7°F | Ht 63.0 in | Wt 209.5 lb

## 2016-10-28 DIAGNOSIS — J0140 Acute pansinusitis, unspecified: Secondary | ICD-10-CM

## 2016-10-28 MED ORDER — GUAIFENESIN ER 600 MG PO TB12: 600.0000 mg | ORAL_TABLET | Freq: Two times a day (BID) | ORAL | 0 refills | Status: DC | PRN

## 2016-10-28 MED ORDER — FLUTICASONE PROPIONATE 50 MCG/ACT NA SUSP: 2.0000 | Freq: Every day | NASAL | 0 refills | Status: DC

## 2016-10-28 MED ORDER — AZITHROMYCIN 250 MG PO TABS: 250.0000 mg | ORAL_TABLET | Freq: Every day | ORAL | 0 refills | Status: DC

## 2016-10-28 NOTE — Progress Notes (Signed)
Subjective:  Patient ID: [name], female    DOB: 07-23-1946  Age: 71 y.o. MRN: [id number]  CC: Nasal Congestion (Pt has been congested for several weeks. Pt has a non productive cough. Pt has been taking OTC allergy medication. )   Sinusitis  This is a new problem. The current episode started 1 to 4 weeks ago. The problem has been gradually worsening since onset. There has been no fever. Associated symptoms include chills, congestion, coughing, headaches, a hoarse voice, sinus pressure and a sore throat. Pertinent negatives include no ear pain, neck pain, shortness of breath, sneezing or swollen glands. Past treatments include oral decongestants. The treatment provided mild relief.    Outpatient Medications Prior to Visit  Medication Sig Dispense Refill  . atorvastatin (LIPITOR) 20 MG tablet TAKE 1 TABLET EVERY DAY 90 tablet 2  . azelastine (ASTELIN) 0.1 % nasal spray instill 1 to 2 sprays into each nostril at bedtime 30 mL 3  . cetirizine (ZYRTEC) 10 MG tablet Take 1 tablet (10 mg total) by mouth daily. As needed 90 tablet 3  . Cholecalciferol (VITAMIN D3) 2000 UNITS capsule Take 2,000 Units by mouth daily.    Marland Kitchen diltiazem (TIAZAC) 120 MG 24 hr capsule TAKE 1 CAPSULE EVERY DAY 90 capsule 2  . DULoxetine (CYMBALTA) 20 MG capsule Take 1 capsule (20 mg total) by mouth daily. 30 capsule 3  . esomeprazole (NEXIUM) 40 MG capsule take 1 capsule by mouth once daily 30 capsule 1  . ferrous sulfate 325 (65 FE) MG tablet Take 1 tablet (325 mg total) by mouth daily with breakfast. 30 tablet 2  . gabapentin (NEURONTIN) 300 MG capsule Take 1 capsule (300 mg total) by mouth 3 (three) times daily. 270 capsule 3  . HYDROcodone-acetaminophen (NORCO/VICODIN) 5-325 MG tablet Take 1 tablet by mouth every 6 (six) hours as needed for moderate pain. 60 tablet 0  . losartan-hydrochlorothiazide (HYZAAR) 100-25 MG tablet Take 1 tablet by mouth daily. 90 tablet 3  . metoprolol (LOPRESSOR) 50 MG tablet TAKE 1  TABLET TWICE DAILY 180 tablet 2  . predniSONE (DELTASONE) 20 MG tablet Take 1.5 tablets (30 mg total) by mouth daily with breakfast. 8 tablet 0  . sucralfate (CARAFATE) 1 G tablet Take 1 g by mouth as needed. Reported on 12/20/2015    . traMADol (ULTRAM) 50 MG tablet Take 1 tablet (50 mg total) by mouth every 8 (eight) hours as needed. 60 tablet 0  . triamcinolone (NASACORT AQ) 55 MCG/ACT AERO nasal inhaler Place 2 sprays into the nose daily. 1 Inhaler 3  . Turmeric 500 MG CAPS Take 1 capsule by mouth daily.     No facility-administered medications prior to visit.     ROS See HPI  Objective:  BP 140/80 (BP Location: Left Arm, Patient Position: Sitting, Cuff Size: Large)   Pulse 80   Temp 98.7 F (37.1 C) (Oral)   Ht 5\' 3"  (1.6 m)   Wt 209 lb 8 oz (95 kg)   SpO2 91%   BMI 37.11 kg/m   BP Readings from Last 3 Encounters:  10/28/16 140/80  10/07/16 126/84  09/16/16 110/76    Wt Readings from Last 3 Encounters:  10/28/16 209 lb 8 oz (95 kg)  10/07/16 212 lb (96.2 kg)  09/16/16 210 lb (95.3 kg)    Physical Exam  Constitutional: She is oriented to person, place, and time.  HENT:  Right Ear: Tympanic membrane, external ear and ear canal normal.  Left Ear: Tympanic membrane,  external ear and ear canal normal.  Nose: Mucosal edema and rhinorrhea present. Right sinus exhibits maxillary sinus tenderness and frontal sinus tenderness. Left sinus exhibits maxillary sinus tenderness and frontal sinus tenderness.  Mouth/Throat: Uvula is midline. No trismus in the jaw. Posterior oropharyngeal erythema present. No oropharyngeal exudate.  Eyes: No scleral icterus.  Neck: Normal range of motion. Neck supple.  Cardiovascular: Normal rate and normal heart sounds.   Pulmonary/Chest: Effort normal and breath sounds normal.  Musculoskeletal: She exhibits no edema.  Lymphadenopathy:    She has no cervical adenopathy.  Neurological: She is alert and oriented to person, place, and time.  Skin:  Skin is warm and dry.  Vitals reviewed.   Lab Results  Component Value Date   WBC 10.5 07/21/2016   HGB 13.9 07/21/2016   HCT 40.8 07/21/2016   PLT 381.0 07/21/2016   GLUCOSE 91 09/18/2014   CHOL 172 09/18/2014   TRIG 115.0 09/18/2014   HDL 53.30 09/18/2014   LDLDIRECT 168.8 01/20/2012   LDLCALC 96 09/18/2014   ALT 21 09/18/2014   AST 21 09/18/2014   NA 138 09/18/2014   K 4.2 09/18/2014   CL 102 09/18/2014   CREATININE 0.9 09/18/2014   BUN 25 (H) 09/18/2014   CO2 29 09/18/2014   TSH 0.70 09/18/2014    Dg Inject Diag/thera/inc Needle/cath/plc Epi/lumb/sac W/img  Result Date: 08/14/2016 CLINICAL DATA:  Lumbosacral spondylosis without myelopathy. Right-sided low back pain radiating into the right lower extremity. 50% improvement following last month's L3-4 interlaminar epidural injection, with pain gradually increasing recently. L4-5 epidural injection requested today. FLUOROSCOPY TIME:  Radiation Exposure Index (as provided by the fluoroscopic device): 18.02 microGray*m^2 Fluoroscopy Time (in minutes and seconds):  9 seconds PROCEDURE: The procedure, risks, benefits, and alternatives were explained to the patient. Questions regarding the procedure were encouraged and answered. The patient understands and consents to the procedure. LUMBAR EPIDURAL INJECTION: An interlaminar approach was performed on the right at L4-5. The overlying skin was cleansed and anesthetized. A 3.5 inch 20 gauge epidural needle was advanced using loss-of-resistance technique. DIAGNOSTIC EPIDURAL INJECTION: Injection of Isovue-M 200 shows a good epidural pattern with spread above and below the level of needle placement, primarily on the right. No vascular opacification is seen. THERAPEUTIC EPIDURAL INJECTION: 120 mg of Depo-Medrol mixed with 3 mL of 1% lidocaine were instilled. The procedure was well-tolerated, and the patient was discharged thirty minutes following the injection in good condition. COMPLICATIONS:  None IMPRESSION: Technically successful lumbar interlaminar epidural injection on the right at L4-5. Electronically Signed   By: Logan Bores M.D.   On: 08/14/2016 10:19    Assessment & Plan:   [name] was seen today for nasal congestion.  Diagnoses and all orders for this visit:  Acute non-recurrent pansinusitis -     fluticasone (FLONASE) 50 MCG/ACT nasal spray; Place 2 sprays into both nostrils daily. -     guaiFENesin (MUCINEX) 600 MG 12 hr tablet; Take 1 tablet (600 mg total) by mouth 2 (two) times daily as needed for cough or to loosen phlegm. -     azithromycin (ZITHROMAX Z-PAK) 250 MG tablet; Take 1 tablet (250 mg total) by mouth daily. Take 2tabs on first day, then 1tab once a day till complete   I am having Ms. Polanco start on fluticasone, guaiFENesin, and azithromycin. I am also having her maintain her cetirizine, Turmeric, Vitamin D3, ferrous sulfate, sucralfate, HYDROcodone-acetaminophen, metoprolol, losartan-hydrochlorothiazide, traMADol, azelastine, triamcinolone, gabapentin, atorvastatin, diltiazem, predniSONE, DULoxetine, and esomeprazole.  Meds ordered  this encounter  Medications  . fluticasone (FLONASE) 50 MCG/ACT nasal spray    Sig: Place 2 sprays into both nostrils daily.    Dispense:  16 g    Refill:  0    Order Specific Question:   Supervising Provider    Answer:   Cassandria Anger [[id number]]  . guaiFENesin (MUCINEX) 600 MG 12 hr tablet    Sig: Take 1 tablet (600 mg total) by mouth 2 (two) times daily as needed for cough or to loosen phlegm.    Dispense:  14 tablet    Refill:  0    Order Specific Question:   Supervising Provider    Answer:   Cassandria Anger [[id number]]  . azithromycin (ZITHROMAX Z-PAK) 250 MG tablet    Sig: Take 1 tablet (250 mg total) by mouth daily. Take 2tabs on first day, then 1tab once a day till complete    Dispense:  6 tablet    Refill:  0    Order Specific Question:   Supervising Provider    Answer:   Cassandria Anger [[id number]]      Follow-up: Return if symptoms worsen or fail to improve.  Wilfred Lacy, NP

## 2016-10-28 NOTE — Progress Notes (Signed)
Pre visit review using our clinic review tool, if applicable. No additional management support is needed unless otherwise documented below in the visit note. 

## 2016-10-28 NOTE — Patient Instructions (Addendum)
[facility] Instructions: Flonase only for at least 7days.  Use saline nasal spray as needed.  Encourage adequate oral hydration.  Use over-the-counter  "cold" medicines  such as "Tylenol cold" , "Advil cold",  "Mucinex"  for cough and congestion.  Avoid decongestants if you have high blood pressure. Use" Delsym" or" Robitussin" cough syrup varietis for cough.  You can use plain "Tylenol" or "Advi"l for fever, chills and achyness.

## 2016-11-03 ENCOUNTER — Telehealth: Payer: Self-pay | Admitting: Internal Medicine

## 2016-11-03 MED ORDER — BENZONATATE 200 MG PO CAPS: 200.0000 mg | ORAL_CAPSULE | Freq: Four times a day (QID) | ORAL | 0 refills | Status: DC | PRN

## 2016-11-03 MED ORDER — DOXYCYCLINE HYCLATE 100 MG PO TABS: 100.0000 mg | ORAL_TABLET | Freq: Two times a day (BID) | ORAL | 0 refills | Status: DC

## 2016-11-03 MED ORDER — PREDNISONE 20 MG PO TABS: 20.0000 mg | ORAL_TABLET | Freq: Every day | ORAL | 0 refills | Status: DC

## 2016-11-03 NOTE — Telephone Encounter (Signed)
Pt c/o cough and increased SOB with exertion x 1 week.  Pt reports mucus production > yellow in color.  Pt states that she has chest tightness and wheezing.  Pt completed Zpak this past Saturday 10/31/16 given by PCP Dr Jenny Reichmann - Pt states that she improved some with the abx but if still having congestion in chest. Pt only using Delsym OTC.   Please advise Dr Annamaria Boots. Thanks.   Allergies  Allergen Reactions  . 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 Sulfonamide Derivatives Itching and Rash   Allergies as of 11/03/2016      Reactions   Soybean-containing Drug Products Other (See Comments)   Allergy testing positive for soy beans but pt uses soy sauce   Sulfonamide Derivatives Itching, Rash      Medication List       Accurate as of 11/03/16 10:39 AM. Always use your most recent med list.          atorvastatin 20 MG tablet Commonly known as:  LIPITOR TAKE 1 TABLET EVERY DAY   azelastine 0.1 % nasal spray Commonly known as:  ASTELIN instill 1 to 2 sprays into each nostril at bedtime   azithromycin 250 MG tablet Commonly known as:  ZITHROMAX Z-PAK Take 1 tablet (250 mg total) by mouth daily. Take 2tabs on first day, then 1tab once a day till complete   cetirizine 10 MG tablet Commonly known as:  ZYRTEC Take 1 tablet (10 mg total) by mouth daily. As needed   diltiazem 120 MG 24 hr capsule Commonly known as:  TIAZAC TAKE 1 CAPSULE EVERY DAY   DULoxetine 20 MG capsule Commonly known as:  CYMBALTA Take 1 capsule (20 mg total) by mouth daily.   esomeprazole 40 MG capsule Commonly known as:  NEXIUM take 1 capsule by mouth once daily   ferrous sulfate 325 (65 FE) MG tablet Take 1 tablet (325 mg total) by mouth daily with breakfast.   fluticasone 50 MCG/ACT nasal spray Commonly known as:  FLONASE Place 2 sprays into both nostrils daily.   gabapentin 300 MG capsule Commonly known as:   NEURONTIN Take 1 capsule (300 mg total) by mouth 3 (three) times daily.   guaiFENesin 600 MG 12 hr tablet Commonly known as:  MUCINEX Take 1 tablet (600 mg total) by mouth 2 (two) times daily as needed for cough or to loosen phlegm.   HYDROcodone-acetaminophen 5-325 MG tablet Commonly known as:  NORCO/VICODIN Take 1 tablet by mouth every 6 (six) hours as needed for moderate pain.   losartan-hydrochlorothiazide 100-25 MG tablet Commonly known as:  HYZAAR Take 1 tablet by mouth daily.   metoprolol 50 MG tablet Commonly known as:  LOPRESSOR TAKE 1 TABLET TWICE DAILY   predniSONE 20 MG tablet Commonly known as:  DELTASONE Take 1.5 tablets (30 mg total) by mouth daily with breakfast.   sucralfate 1 g tablet Commonly known as:  CARAFATE Take 1 g by mouth as needed. Reported on 12/20/2015   traMADol 50 MG tablet Commonly known as:  ULTRAM Take 1 tablet (50 mg total) by mouth every 8 (eight) hours as needed.   triamcinolone 55 MCG/ACT Aero nasal inhaler Commonly known as:  NASACORT AQ Place 2 sprays into the nose daily.   Turmeric 500 MG Caps Take 1 capsule by mouth daily.   Vitamin D3 2000 units capsule Take 2,000 Units by mouth daily.

## 2016-11-03 NOTE — Telephone Encounter (Signed)
Offer doxycycline 100 mg, # 14, 1 twice daily            Prednisone 20 mg, # 4, 1 daily             Benzonatate perles 200 mg, # 30, 1 every 6 hours if needed for cough

## 2016-11-03 NOTE — Telephone Encounter (Signed)
Pt aware of rec's per Dr Annamaria Boots - Rxs sent to [facility]. Nothing further needed.

## 2016-11-16 ENCOUNTER — Encounter: Payer: Self-pay | Admitting: Internal Medicine

## 2016-11-16 ENCOUNTER — Ambulatory Visit (INDEPENDENT_AMBULATORY_CARE_PROVIDER_SITE_OTHER): Payer: Medicare HMO | Admitting: Internal Medicine

## 2016-11-16 ENCOUNTER — Ambulatory Visit (INDEPENDENT_AMBULATORY_CARE_PROVIDER_SITE_OTHER)
Admission: RE | Admit: 2016-11-16 | Discharge: 2016-11-16 | Disposition: A | Discharge status: Home / Self Care | Payer: Medicare HMO | Source: Ambulatory Visit | Attending: Internal Medicine | Admitting: Internal Medicine

## 2016-11-16 ENCOUNTER — Other Ambulatory Visit: Payer: Self-pay | Admitting: Internal Medicine

## 2016-11-16 VITALS — BP 122/84 | HR 81 | Ht 63.0 in | Wt 212.0 lb

## 2016-11-16 DIAGNOSIS — R0609 Other forms of dyspnea: Secondary | ICD-10-CM | POA: Diagnosis not present

## 2016-11-16 DIAGNOSIS — J209 Acute bronchitis, unspecified: Secondary | ICD-10-CM | POA: Diagnosis not present

## 2016-11-16 DIAGNOSIS — R05 Cough: Secondary | ICD-10-CM | POA: Diagnosis not present

## 2016-11-16 DIAGNOSIS — Z1231 Encounter for screening mammogram for malignant neoplasm of breast: Secondary | ICD-10-CM

## 2016-11-16 MED ORDER — LEVALBUTEROL HCL 0.63 MG/3ML IN NEBU
0.6300 mg | INHALATION_SOLUTION | Freq: Once | RESPIRATORY_TRACT | Status: AC
  Administered 2016-11-16: 0.63 mg via RESPIRATORY_TRACT

## 2016-11-16 MED ORDER — METHYLPREDNISOLONE ACETATE 80 MG/ML IJ SUSP
80.0000 mg | Freq: Once | INTRAMUSCULAR | Status: AC
  Administered 2016-11-16: 80 mg via INTRAMUSCULAR

## 2016-11-16 NOTE — Progress Notes (Signed)
HPI female never smoker followed for allergic rhinitis, recurrent sinusitis, food allergy Allergy Profile 09/18/2013-total IgE 48. Broadly positive for common allergens including aspergillus. Food profile positive for peanut, soybean. She reports banana and melons make her throat itch.  -------------------------------------------------------------------------  10/17/2015-71 year old female never smoker followed for allergic rhinitis, recurrent sinusitis, food allergy Pt c/o HA, nasal cong, PND, prod cough (white phlem), slight wheezing/chest tx at times.  Current symptoms persisted over several months. We discussed previous CT indicating chronic left maxillary sinus disease. Neti pot with saline rinse helps if she will use it.  11/16/2016-71 year old female never smoker followed for Allergic rhinitis, recurrent sinusitis, food allergy FOLOWS FOR: Pt c/o cough, sinus congestion and SOB x 1 month. Not taking anything for the sinus congestion at this time. Cough is mostly dry, Using Tessalon PRN Has noted shortness of breath mainly with exertion over the last month or so. Denies chest pain, swelling, palpitation. Developed bronchitis 2 or 3 weeks ago which improved after Z-Pak and prednisone. Residual dry cough-Tessalon Perles are helping. Had epistaxis with dry heat plus Flonase.  ROS-see HPI Constitutional:   No-   weight loss, night sweats, fevers, chills, fatigue, lassitude. HEENT:   + headaches, no-difficulty swallowing, tooth/dental problems, sore throat,       No-  sneezing, +itching, +ear ache, +nasal congestion, post nasal drip,  CV:  No-   chest pain, orthopnea, PND, swelling in lower extremities, anasarca, dizziness, palpitations Resp: No-   shortness of breath with exertion or at rest.              No-productive cough, +non-productive cough,  No- coughing up of blood.              No-   change in color of mucus.  No- wheezing.   Skin: No-   rash or lesions. GI:  No-   heartburn,  indigestion, abdominal pain, nausea, vomiting,  GU: . MS:  No-   joint pain or swelling.   Neuro-     nothing unusual Psych:  No- change in mood or affect. No depression or anxiety.  No memory loss.  OBJ- Physical Exam General- Alert, Oriented, Affect-appropriate, Distress- none acute. + Obese Skin- rash-none, lesions- none, excoriation- none Lymphadenopathy- none Head- atraumatic            Eyes- Gross vision intact, PERRLA, conjunctivae and secretions clear            Ears- Hearing, canals-normal            Nose- clear, no-Septal dev, mucus, polyps, erosion, perforation             Throat- Mallampati II , mucosa clear , drainage- none, tonsils- atrophic Neck- flexible , trachea midline, no stridor , thyroid nl, carotid no bruit Chest - symmetrical excursion , unlabored           Heart/CV- RRR , no murmur , no gallop  , no rub, nl s1 s2                           - JVD- none , edema- none, stasis changes- none, varices- none           Lung- clear to P&A, wheeze- none, cough+ dry , dullness-none, rub- none           Chest wall-  Abd- Br/ Gen/ Rectal- Not done, not indicated Extrem- cyanosis- none, clubbing, none, atrophy- none, strength- nl Neuro- grossly intact to observation

## 2016-11-16 NOTE — Patient Instructions (Addendum)
Order- neb xop 0.63      Dx acute bronchitis              Depo 35  Order- CXR    Dx dyspnea on exertion  Please call if we can help

## 2016-11-16 NOTE — Assessment & Plan Note (Signed)
Lingering cough now is quite typical following an ordinary viral syndrome respiratory infection. We discussed symptomatic meds. She does have Best [employer]. Plan-nebulizer Xopenex, Depo-Medrol, sips of liquids, throat lozenges, Perles

## 2016-11-16 NOTE — Assessment & Plan Note (Signed)
She describes awareness of this issue over the past month or so, mainly climbing stairs or unusual exertion. It preceded onset of her bronchitis by a week or 2. Plan-CXR, nebulizer Xopenex, Depo-Medrol

## 2016-12-02 ENCOUNTER — Ambulatory Visit: Payer: Commercial Managed Care - HMO | Admitting: Internal Medicine

## 2016-12-11 ENCOUNTER — Ambulatory Visit (INDEPENDENT_AMBULATORY_CARE_PROVIDER_SITE_OTHER): Payer: Medicare HMO | Admitting: Internal Medicine

## 2016-12-11 ENCOUNTER — Other Ambulatory Visit: Payer: Self-pay | Admitting: Internal Medicine

## 2016-12-11 ENCOUNTER — Encounter: Payer: Self-pay | Admitting: Internal Medicine

## 2016-12-11 VITALS — BP 124/84 | HR 86 | Temp 97.7°F | Ht 63.0 in | Wt 201.0 lb

## 2016-12-11 DIAGNOSIS — S4381XA Sprain of other specified parts of right shoulder girdle, initial encounter: Secondary | ICD-10-CM | POA: Insufficient documentation

## 2016-12-11 DIAGNOSIS — R253 Fasciculation: Secondary | ICD-10-CM

## 2016-12-11 DIAGNOSIS — R21 Rash and other nonspecific skin eruption: Secondary | ICD-10-CM | POA: Diagnosis not present

## 2016-12-11 MED ORDER — TRAMADOL HCL 50 MG PO TABS: 50.0000 mg | ORAL_TABLET | Freq: Three times a day (TID) | ORAL | 0 refills | Status: DC | PRN

## 2016-12-11 MED ORDER — CLOTRIMAZOLE-BETAMETHASONE 1-0.05 % EX CREA: TOPICAL_CREAM | CUTANEOUS | 1 refills | Status: DC

## 2016-12-11 MED ORDER — TIZANIDINE HCL 4 MG PO TABS: 4.0000 mg | ORAL_TABLET | Freq: Four times a day (QID) | ORAL | 0 refills | Status: DC | PRN

## 2016-12-11 NOTE — Progress Notes (Signed)
Subjective:    Patient ID: [name], female    DOB: [birth year], 71 y.o.   MRN: [id number]  HPI  Here with c/o 6 days onset right upper back pain, sharp , mild to mod, intermiettent, mild sore to touch but not really assoc with right arm or shoulder or neck pain, ice helped but nothing else seems to make better or worse.  Also has a itchy rash to midline lower lumbar area, tends to sweat quite a bit.  Also mentions small recurring twitching to left upper chest wall that keeps occurring randomly over the past wk.  Pt denies other chest pain, increased sob or doe, wheezing, orthopnea, PND, increased LE swelling, palpitations, dizziness or syncope. Pt denies new neurological symptoms such as new headache, or facial or extremity weakness or numbness   Pt denies polydipsia, polyuria Past Medical History:  Diagnosis Date  . Allergic rhinitis, cause unspecified   . Allergy    SEASONAL  . Anemia   . Chronic headaches   . Diverticulosis   . DJD (degenerative joint disease), lumbar   . GERD (gastroesophageal reflux disease)   . Hemorrhoids   . Hiatal hernia   . HTN (hypertension)   . Hyperlipidemia   . Obesity   . Osteoarthritis   . Tubular adenoma of colon    Past Surgical History:  Procedure Laterality Date  . ABLATION    . COLONOSCOPY    . TUBAL LIGATION      reports that she has never smoked. She has never used smokeless tobacco. She reports that she does not drink alcohol or use drugs. family history includes Diabetes in her brother; Heart disease in her mother; Hypertension in her mother. Allergies  Allergen Reactions  . 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 Sulfonamide Derivatives Itching and Rash   Current Outpatient Prescriptions on File Prior to Visit  Medication Sig Dispense Refill  . atorvastatin (LIPITOR) 20 MG tablet TAKE 1 TABLET EVERY DAY 90 tablet 2  . azelastine (ASTELIN) 0.1 %  nasal spray instill 1 to 2 sprays into each nostril at bedtime 30 mL 3  . azithromycin (ZITHROMAX Z-PAK) 250 MG tablet Take 1 tablet (250 mg total) by mouth daily. Take 2tabs on first day, then 1tab once a day till complete 6 tablet 0  . benzonatate (TESSALON) 200 MG capsule Take 1 capsule (200 mg total) by mouth every 6 (six) hours as needed for cough. 30 capsule 0  . cetirizine (ZYRTEC) 10 MG tablet Take 1 tablet (10 mg total) by mouth daily. As needed 90 tablet 3  . Cholecalciferol (VITAMIN D3) 2000 UNITS capsule Take 2,000 Units by mouth daily.    Marland Kitchen diltiazem (TIAZAC) 120 MG 24 hr capsule TAKE 1 CAPSULE EVERY DAY 90 capsule 2  . DULoxetine (CYMBALTA) 20 MG capsule Take 1 capsule (20 mg total) by mouth daily. 30 capsule 3  . esomeprazole (NEXIUM) 40 MG capsule take 1 capsule by mouth once daily 30 capsule 1  . ferrous sulfate 325 (65 FE) MG tablet Take 1 tablet (325 mg total) by mouth daily with breakfast. 30 tablet 2  . fluticasone (FLONASE) 50 MCG/ACT nasal spray Place 2 sprays into both nostrils daily. 16 g 0  . gabapentin (NEURONTIN) 300 MG capsule Take 1 capsule (300 mg total) by mouth 3 (three) times daily. 270 capsule 3  . guaiFENesin (MUCINEX) 600 MG 12 hr tablet Take 1  tablet (600 mg total) by mouth 2 (two) times daily as needed for cough or to loosen phlegm. 14 tablet 0  . HYDROcodone-acetaminophen (NORCO/VICODIN) 5-325 MG tablet Take 1 tablet by mouth every 6 (six) hours as needed for moderate pain. 60 tablet 0  . losartan-hydrochlorothiazide (HYZAAR) 100-25 MG tablet Take 1 tablet by mouth daily. 90 tablet 3  . metoprolol (LOPRESSOR) 50 MG tablet TAKE 1 TABLET TWICE DAILY 180 tablet 2  . sucralfate (CARAFATE) 1 G tablet Take 1 g by mouth as needed. Reported on 12/20/2015    . triamcinolone (NASACORT AQ) 55 MCG/ACT AERO nasal inhaler Place 2 sprays into the nose daily. 1 Inhaler 3  . Turmeric 500 MG CAPS Take 1 capsule by mouth daily.     No current facility-administered medications on  file prior to visit.    Review of Systems  Constitutional: Negative for unusual diaphoresis or night sweats HENT: Negative for ear swelling or discharge Eyes: Negative for worsening visual haziness  Respiratory: Negative for choking and stridor.   Gastrointestinal: Negative for distension or worsening eructation Genitourinary: Negative for retention or change in urine volume.  Musculoskeletal: Negative for other MSK pain or swelling Skin: Negative for color change and worsening wound Neurological: Negative for tremors and numbness other than noted  Psychiatric/Behavioral: Negative for decreased concentration or agitation other than above   All other system neg per pt    Objective:   Physical Exam BP 124/84   Pulse 86   Temp 97.7 F (36.5 C)   Ht 5\' 3"  (1.6 m)   Wt 201 lb (91.2 kg)   SpO2 98%   BMI 35.61 kg/m  VS noted,  Constitutional: Pt appears in no apparent distress HENT: Head: NCAT.  Right Ear: External ear normal.  Left Ear: External ear normal.  Eyes: . Pupils are equal, round, and reactive to light. Conjunctivae and EOM are normal Neck: Normal range of motion. Neck supple.  Cardiovascular: Normal rate and regular rhythm.   Pulmonary/Chest: Effort normal and breath sounds without rales or wheezing.  MSK: tender right trapezoid without swelling or rash bilat shoulders without tender, swelling or pain with active ROM Neurological: Pt is alert. Not confused , motor grossly intact Skin: Skin is warm. + grey non raised spotted rash to low mid lumbar NT, no LE edema Psychiatric: Pt behavior is normal. No agitation.  No other new exam findings    Assessment & Plan:

## 2016-12-11 NOTE — Assessment & Plan Note (Signed)
Mild to mod, for pain control, muscle relaxer prn,  to f/u any worsening symptoms or concerns

## 2016-12-11 NOTE — Patient Instructions (Signed)
Please take all new medication as prescribed - the pain medication, muscle relaxer, and cream as directed  Please continue all other medications as before, and refills have been done if requested.  Please have the pharmacy call with any other refills you may need.  Please keep your appointments with your specialists as you may have planned

## 2016-12-11 NOTE — Assessment & Plan Note (Signed)
D/w pt benign nature , no specific tx or further eval needed

## 2016-12-11 NOTE — Assessment & Plan Note (Signed)
Mild to mod, for lotrisone asd,  to f/u any worsening symptoms or concerns 

## 2016-12-16 NOTE — Progress Notes (Signed)
[name] [address] [place], [place] [address] Phone: [phone] Subjective:      CC: Bilateral knee pain follow up    [id number]  [name] is a 71 y.o. female coming in with complaint of  bilateral knee pain. 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  Patient has not had an injection for the last 4 months. Patient to sign seems to be doing worsening at this time. Bilateral knees. Difficulty walking. Still not ready to have any type of replacement.  Patient was having significant back pain. Was having lower back pain. Sent for x-rays and a possible L1 compression fracture was diagnosed.  MRI was done and independently visualized by me showing significant spinal stenosis that is severe in degree at L3-L4. Patient also has multiple areas of degenerative changes as well as a potential L5 nerve root impingement on the right side. Continues to do well.  Left shoulder much better after injection previously.   Past Medical History:  Diagnosis Date  . Allergic rhinitis, cause unspecified   . Allergy    SEASONAL  . Anemia   . Chronic headaches   . Diverticulosis   . DJD (degenerative joint disease), lumbar   . GERD (gastroesophageal reflux disease)   . Hemorrhoids   . Hiatal hernia   . HTN (hypertension)   . Hyperlipidemia   . Obesity   . Osteoarthritis   . Tubular adenoma of colon    Past Surgical History:  Procedure Laterality Date  . ABLATION    . COLONOSCOPY    . TUBAL LIGATION     Social History  Substance Use Topics  . Smoking status: Never Smoker  . Smokeless tobacco: Never Used  . Alcohol use No   Allergies  Allergen Reactions  . 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 Sulfonamide Derivatives Itching and Rash   Family History  Problem Relation  Age of Onset  . Heart disease Mother   . Hypertension Mother   . Diabetes Brother   . Colon cancer Neg Hx   . Esophageal cancer Neg Hx   . Rectal cancer Neg Hx   . Stomach cancer Neg Hx        Past medical history, social, surgical and family history all reviewed and no pertinent info pertaining to chief complaint. All other was reviewed using the EMR.    Review of Systems: No headache, visual changes, nausea, vomiting, diarrhea, constipation, dizziness, abdominal pain, skin rash, fevers, chills, night sweats, weight loss, swollen lymph nodes, body aches, joint swelling, chest pain, shortness of breath, mood changes.  Positive muscle aches  Objective  Blood pressure 126/82, pulse 69, height 5\' 3"  (1.6 m), weight 201 lb (91.2 kg), SpO2 97 %.  Systems examined below as of 12/17/16 General: NAD A&O x3 mood, affect normal  HEENT: Pupils equal, extraocular movements intact no nystagmus Respiratory: not short of breath at rest or with speaking Cardiovascular: No lower extremity edema, non tender Skin: Warm dry intact with no signs of infection or rash on extremities or on axial skeleton. Abdomen: Soft nontender, no masses Neuro: Cranial nerves  intact, neurovascularly intact in all extremities with 2+ DTRs and 2+ pulses. Lymph: No lymphadenopathy appreciated today  Gait antalgic gait Noted MSK:  Non tender with full range of motion and  good stability and symmetric strength and tone of shoulders, elbows, wrist, hips bilaterally. Arthritic changes of multiple joints Knee: Bilateral valgus deformity noted. Large thigh to calf ratio.  Tender to palpation over medial and PF joint line.  ROM full in flexion and extension and lower leg rotation. instability with valgus force.  painful patellar compression. Patellar glide with moderate crepitus. Patellar and quadriceps tendons unremarkable. Hamstring and quadriceps strength is normal.  After informed written and verbal consent, patient was  seated on exam table. Right knee was prepped with alcohol swab and utilizing anterolateral approach, patient's right knee space was injected with 4:1  marcaine 0.5%: Kenalog 40mg /dL. Patient tolerated the procedure well without immediate complications.  After informed written and verbal consent, patient was seated on exam table. Left knee was prepped with alcohol swab and utilizing anterolateral approach, patient's left knee space was injected with 4:1  marcaine 0.5%: Kenalog 40mg /dL. Patient tolerated the procedure well without immediate complications.    Impression and Recommendations:     This case required medical decision making of moderate complexity.

## 2016-12-17 ENCOUNTER — Encounter: Payer: Self-pay | Admitting: Family Medicine

## 2016-12-17 ENCOUNTER — Ambulatory Visit (INDEPENDENT_AMBULATORY_CARE_PROVIDER_SITE_OTHER): Payer: Medicare HMO | Admitting: Family Medicine

## 2016-12-17 DIAGNOSIS — M170 Bilateral primary osteoarthritis of knee: Secondary | ICD-10-CM | POA: Diagnosis not present

## 2016-12-17 NOTE — Patient Instructions (Signed)
See me again in 3 weeks and we can start orthovisc if needed

## 2016-12-17 NOTE — Assessment & Plan Note (Signed)
Bilateral injections given today secondary to worsening pain. Tolerated the procedures well. Patient is a candidate for viscous supplementation if needed. Patient will continue with conservative therapy. Patient will come back and see me again in 3-4 weeks. At that time we'll discuss other management. Likely need joint replacement at some point.

## 2016-12-18 ENCOUNTER — Telehealth: Payer: Self-pay | Admitting: Internal Medicine

## 2016-12-18 MED ORDER — ESOMEPRAZOLE MAGNESIUM 40 MG PO CPDR: 40.0000 mg | DELAYED_RELEASE_CAPSULE | Freq: Every day | ORAL | 1 refills | Status: DC

## 2016-12-18 NOTE — Telephone Encounter (Signed)
Rx sent until appointment. 

## 2016-12-24 ENCOUNTER — Ambulatory Visit
Admission: RE | Admit: 2016-12-24 | Discharge: 2016-12-24 | Disposition: A | Discharge status: Home / Self Care | Payer: Medicare HMO | Source: Ambulatory Visit | Attending: Internal Medicine | Admitting: Internal Medicine

## 2016-12-24 DIAGNOSIS — Z1231 Encounter for screening mammogram for malignant neoplasm of breast: Secondary | ICD-10-CM

## 2017-01-04 ENCOUNTER — Other Ambulatory Visit: Payer: Self-pay | Admitting: Internal Medicine

## 2017-01-07 ENCOUNTER — Ambulatory Visit: Payer: Medicare HMO | Admitting: Family Medicine

## 2017-01-12 ENCOUNTER — Telehealth: Payer: Self-pay | Admitting: *Deleted

## 2017-01-12 MED ORDER — DULOXETINE HCL 20 MG PO CPEP: 20.0000 mg | ORAL_CAPSULE | Freq: Every day | ORAL | 3 refills | Status: DC

## 2017-01-12 NOTE — Telephone Encounter (Signed)
Rc'd fax pt requesting refill on her duloxetine 20 mg. Verified chart pt is up-to-date saw Dr. Tamala Julian on 12/17/16 sent refill to [facility]...Johny Chess

## 2017-02-02 ENCOUNTER — Ambulatory Visit (INDEPENDENT_AMBULATORY_CARE_PROVIDER_SITE_OTHER): Payer: Medicare HMO | Admitting: Internal Medicine

## 2017-02-02 ENCOUNTER — Encounter: Payer: Self-pay | Admitting: Internal Medicine

## 2017-02-02 VITALS — BP 116/68 | HR 70 | Ht 63.0 in | Wt 213.0 lb

## 2017-02-02 DIAGNOSIS — D509 Iron deficiency anemia, unspecified: Secondary | ICD-10-CM | POA: Diagnosis not present

## 2017-02-02 DIAGNOSIS — Z860100 Personal history of colon polyps, unspecified: Secondary | ICD-10-CM

## 2017-02-02 DIAGNOSIS — K219 Gastro-esophageal reflux disease without esophagitis: Secondary | ICD-10-CM | POA: Diagnosis not present

## 2017-02-02 MED ORDER — FERROUS SULFATE 325 (65 FE) MG PO TABS: 325.0000 mg | ORAL_TABLET | Freq: Every day | ORAL | 11 refills | Status: DC

## 2017-02-02 MED ORDER — RANITIDINE HCL 150 MG PO TABS: 150.0000 mg | ORAL_TABLET | Freq: Two times a day (BID) | ORAL | 3 refills | Status: DC | PRN

## 2017-02-02 MED ORDER — ESOMEPRAZOLE MAGNESIUM 40 MG PO CPDR: 40.0000 mg | DELAYED_RELEASE_CAPSULE | Freq: Every day | ORAL | 11 refills | Status: DC

## 2017-02-02 NOTE — Patient Instructions (Signed)
Please continue your nexium daily.  Dr. Hilarie Fredrickson has sent in a prescription for ranitidine 150 mg.  You will take this 2 times a day as needed for break through heartburn or indigestion.  Please continue your oral iron  Dr. Hilarie Fredrickson would like to see you back in the office in 1 year.

## 2017-02-02 NOTE — Progress Notes (Signed)
Subjective:    Patient ID: [name], female    DOB: 05/04/1946, 71 y.o.   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  She reports that she has been doing well. She completed 2 hemorrhoidal banding sessions and has had resolution of hemorrhoidal bleeding. She has some intermittent perianal itching which is mild. Stools vary from hard to regular. They can be loose if she eats cereal with milk. She denies rectal bleeding, hematochezia and melena. If she feels constipated she will drink apple or prune juice with good result. She has continued ferrous sulfate 325 mg per day.  Reflux is largely control with Nexium 40 mg daily but if she eats a heavy meal she can have breakthrough heartburn which she notices in her mid chest as a burning sensation. No shortness of breath or dyspnea associated. No dysphagia or odynophagia. He denies abdominal pain.  She underwent repeat workup of her IDA in February 2016 with upper endoscopy, colonoscopy and video capsule endoscopy. Upper endoscopy showed gastritis. Colonoscopy revealed a 5 mm tubular adenoma which was removed and mild diverticulosis. Her video capsule was normal.   Review of Systems   As per history of present illness, otherwise negative  Current Medications, Allergies, Past Medical History, Past Surgical History, Family History and Social History were reviewed in [employer] record.     Objective:   Physical Exam  BP 116/68   Pulse 70   Ht 5\' 3"  (1.6 m)   Wt 213 lb (96.6 kg)   BMI 37.73 kg/m  Constitutional: Well-developed and well-nourished. No distress. HEENT: Normocephalic and atraumatic. Oropharynx is clear and moist. Conjunctivae are normal.  No scleral  icterus. Neck: Neck supple. Trachea midline. Cardiovascular: Normal rate, regular rhythm and intact distal pulses. No M/R/G Pulmonary/chest: Effort normal and breath sounds normal. No wheezing, rales or rhonchi. Abdominal: Soft, nontender, nondistended. Bowel sounds active throughout. There are no masses palpable. No hepatosplenomegaly. Extremities: no clubbing, cyanosis, or edema Neurological: Alert and oriented to person place and time. Skin: Skin is warm and dry. Psychiatric: Normal mood and affect. Behavior is normal.  CBC    Component Value Date/Time   WBC 10.5 07/21/2016 0942   RBC 4.70 07/21/2016 0942   HGB 13.9 07/21/2016 0942   HCT 40.8 07/21/2016 0942   PLT 381.0 07/21/2016 0942   MCV 86.9 07/21/2016 0942   MCHC 34.1 07/21/2016 0942   RDW 13.7 07/21/2016 0942   LYMPHSABS 2.6 07/21/2016 0942   MONOABS 0.8 07/21/2016 0942   EOSABS 0.3 07/21/2016 0942   BASOSABS 0.2 (H) 07/21/2016 0942   Iron/TIBC/Ferritin/ %Sat    Component Value Date/Time   IRON 78 07/21/2016 0942   FERRITIN 35.7 07/21/2016 0942   IRONPCTSAT 21.3 07/21/2016 0942      Assessment & Plan:  71 year old female with a past medical history of iron deficiency anemia, GERD, 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  1. GERD -- mostly well controlled with some breakthrough. We will continue Nexium 40 mg 30 Ms. before breakfast. Add ranitidine 150 mg 1-2 times daily as needed for breakthrough heartburn. Normal esophagus at EGD 2 years ago   2. History of colon polyps  -- 5 year interval based  on one adenoma removed in 2016.   3. IDA  -- history of iron deficiency now resolved with once daily oral iron. I recommend she continue ferrous sulfate 325 mg once daily. Hemoglobin and iron studies were normal in October 2017. These can be followed annually by primary care.   Annual  follow-up with me, sooner if necessary  15 minutes spent with the patient today. Greater than 50% was spent in counseling and coordination of care with the patient

## 2017-02-08 ENCOUNTER — Telehealth: Payer: Self-pay

## 2017-02-08 NOTE — Telephone Encounter (Signed)
She has been on oral iron long-term due to her iron deficiency, to my understanding? Could possibly be another medication? Has she tolerated oral iron previously and recently switched to a new type?

## 2017-02-08 NOTE — Telephone Encounter (Signed)
Spoke with pt and she has been on the Iron for quite a while. States she has not tried any new soaps or lotions. Pt instructed to stop the iron for 2 weeks and see if the rash goes away. Pt should then resume the iron and let us know if she breaks out again and then we will know if she is reacting to the iron. Pt verbalized understanding.

## 2017-02-08 NOTE — Telephone Encounter (Signed)
Pt states that when she started taking the Iron pills she noticed she broke out in a rash on her breasts and upper chest. States it is a red rash and does itch some. Pt states she has some cream that Dr. Jenny Reichmann had given her in the past and it seems to be getting better. Pt wants to know if she should continue taking the Iron. Please advise.

## 2017-02-10 ENCOUNTER — Ambulatory Visit (INDEPENDENT_AMBULATORY_CARE_PROVIDER_SITE_OTHER): Payer: Medicare HMO | Admitting: Internal Medicine

## 2017-02-10 ENCOUNTER — Encounter: Payer: Self-pay | Admitting: Internal Medicine

## 2017-02-10 VITALS — BP 124/74 | HR 59 | Temp 98.4°F | Resp 16 | Ht 63.0 in | Wt 214.5 lb

## 2017-02-10 DIAGNOSIS — B029 Zoster without complications: Secondary | ICD-10-CM | POA: Diagnosis not present

## 2017-02-10 MED ORDER — VALACYCLOVIR HCL 1 G PO TABS: 1000.0000 mg | ORAL_TABLET | Freq: Two times a day (BID) | ORAL | 0 refills | Status: AC

## 2017-02-10 MED ORDER — PREDNISONE 5 MG PO TABS: 15.0000 mg | ORAL_TABLET | Freq: Two times a day (BID) | ORAL | 0 refills | Status: AC

## 2017-02-10 MED ORDER — PREDNISONE 10 MG PO TABS: 30.0000 mg | ORAL_TABLET | Freq: Two times a day (BID) | ORAL | 0 refills | Status: DC

## 2017-02-10 MED ORDER — PREDNISONE 2.5 MG PO TABS: 7.5000 mg | ORAL_TABLET | Freq: Two times a day (BID) | ORAL | 0 refills | Status: DC

## 2017-02-10 MED ORDER — OXYCODONE-ACETAMINOPHEN 7.5-325 MG PO TABS: 1.0000 | ORAL_TABLET | Freq: Four times a day (QID) | ORAL | 0 refills | Status: DC | PRN

## 2017-02-10 NOTE — Patient Instructions (Signed)
Shingles Shingles, which is also known as herpes zoster, is an infection that causes a painful skin rash and fluid-filled blisters. Shingles is not related to genital herpes, which is a sexually transmitted infection. Shingles only develops in people who:  Have had chickenpox.  Have received the chickenpox vaccine. (This is rare.)  What are the causes? Shingles is caused by varicella-zoster virus (VZV). This is the same virus that causes chickenpox. After exposure to VZV, the virus stays in the body in an inactive (dormant) state. Shingles develops if the virus reactivates. This can happen many years after the initial exposure to VZV. It is not known what causes this virus to reactivate. What increases the risk? People who have had chickenpox or received the chickenpox vaccine are at risk for shingles. Infection is more common in people who:  Are older than age 50.  Have a weakened defense (immune) system, such as those with HIV, AIDS, or cancer.  Are taking medicines that weaken the immune system, such as transplant medicines.  Are under great stress.  What are the signs or symptoms? Early symptoms of this condition include itching, tingling, and pain in an area on your skin. Pain may be described as burning, stabbing, or throbbing. A few days or weeks after symptoms start, a painful red rash appears, usually on one side of the body in a bandlike or beltlike pattern. The rash eventually turns into fluid-filled blisters that break open, scab over, and dry up in about 2-3 weeks. At any time during the infection, you may also develop:  A fever.  Chills.  A headache.  An upset stomach.  How is this diagnosed? This condition is diagnosed with a skin exam. Sometimes, skin or fluid samples are taken from the blisters before a diagnosis is made. These samples are examined under a microscope or sent to a lab for testing. How is this treated? There is no specific cure for this condition.  Your health care provider will probably prescribe medicines to help you manage pain, recover more quickly, and avoid long-term problems. Medicines may include:  Antiviral drugs.  Anti-inflammatory drugs.  Pain medicines.  If the area involved is on your face, you may be referred to a specialist, such as an eye doctor (ophthalmologist) or an ear, nose, and throat (ENT) doctor to help you avoid eye problems, chronic pain, or disability. Follow these instructions at home: Medicines  Take medicines only as directed by your health care provider.  Apply an anti-itch or numbing cream to the affected area as directed by your health care provider. Blister and Rash Care  Take a cool bath or apply cool compresses to the area of the rash or blisters as directed by your health care provider. This may help with pain and itching.  Keep your rash covered with a loose bandage (dressing). Wear loose-fitting clothing to help ease the pain of material rubbing against the rash.  Keep your rash and blisters clean with mild soap and cool water or as directed by your health care provider.  Check your rash every day for signs of infection. These include redness, swelling, and pain that lasts or increases.  Do not pick your blisters.  Do not scratch your rash. General instructions  Rest as directed by your health care provider.  Keep all follow-up visits as directed by your health care provider. This is important.  Until your blisters scab over, your infection can cause chickenpox in people who have never had it or been vaccinated   against it. To prevent this from happening, avoid contact with other people, especially: ? Babies. ? Pregnant women. ? Children who have eczema. ? Elderly people who have transplants. ? People who have chronic illnesses, such as leukemia or AIDS. Contact a health care provider if:  Your pain is not relieved with prescribed medicines.  Your pain does not get better after  the rash heals.  Your rash looks infected. Signs of infection include redness, swelling, and pain that lasts or increases. Get help right away if:  The rash is on your face or nose.  You have facial pain, pain around your eye area, or loss of feeling on one side of your face.  You have ear pain or you have ringing in your ear.  You have loss of taste.  Your condition gets worse. This information is not intended to replace advice given to you by your health care provider. Make sure you discuss any questions you have with your health care provider. Document Released: 10/05/2005 Document Revised: 05/31/2016 Document Reviewed: 08/16/2014 Elsevier Interactive Patient Education  2017 [employer]  

## 2017-02-10 NOTE — Progress Notes (Signed)
Pre visit review using our clinic review tool, if applicable. No additional management support is needed unless otherwise documented below in the visit note. 

## 2017-02-10 NOTE — Progress Notes (Signed)
Subjective:  Patient ID: [name], female    DOB: 1946/02/27  Age: 71 y.o. MRN: [id number]  CC: Rash   HPI [name] presents for a 5 day hx of Painful rash over her left breast. She describes red spots that sting. She has tried to treat it with a topical steroid but has gotten no symptom relief.  Outpatient Medications Prior to Visit  Medication Sig Dispense Refill  . atorvastatin (LIPITOR) 20 MG tablet TAKE 1 TABLET EVERY DAY 90 tablet 2  . azelastine (ASTELIN) 0.1 % nasal spray instill 1 to 2 sprays into each nostril at bedtime 30 mL 3  . cetirizine (ZYRTEC) 10 MG tablet Take 1 tablet (10 mg total) by mouth daily. As needed 90 tablet 3  . Cholecalciferol (VITAMIN D3) 2000 UNITS capsule Take 2,000 Units by mouth daily.    . clotrimazole-betamethasone (LOTRISONE) cream Use as directed twice per day as needed 15 g 1  . diltiazem (TIAZAC) 120 MG 24 hr capsule TAKE 1 CAPSULE EVERY DAY 90 capsule 2  . DULoxetine (CYMBALTA) 20 MG capsule Take 1 capsule (20 mg total) by mouth daily. 30 capsule 3  . esomeprazole (NEXIUM) 40 MG capsule Take 1 capsule (40 mg total) by mouth daily. 30 capsule 11  . ferrous sulfate 325 (65 FE) MG tablet Take 1 tablet (325 mg total) by mouth daily with breakfast. 30 tablet 11  . fluticasone (FLONASE) 50 MCG/ACT nasal spray Place 2 sprays into both nostrils daily. 16 g 0  . gabapentin (NEURONTIN) 300 MG capsule Take 1 capsule (300 mg total) by mouth 3 (three) times daily. 270 capsule 3  . guaiFENesin (MUCINEX) 600 MG 12 hr tablet Take 1 tablet (600 mg total) by mouth 2 (two) times daily as needed for cough or to loosen phlegm. 14 tablet 0  . HYDROcodone-acetaminophen (NORCO/VICODIN) 5-325 MG tablet Take 1 tablet by mouth every 6 (six) hours as needed for moderate pain. 60 tablet 0  . losartan-hydrochlorothiazide (HYZAAR) 100-25 MG tablet Take 1 tablet by mouth daily. 90 tablet 3  . metoprolol (LOPRESSOR) 50 MG tablet TAKE 1 TABLET TWICE DAILY 180 tablet 0    . ranitidine (ZANTAC) 150 MG tablet Take 1 tablet (150 mg total) by mouth 2 (two) times daily as needed for heartburn. 60 tablet 3  . sucralfate (CARAFATE) 1 G tablet Take 1 g by mouth as needed. Reported on 12/20/2015    . tiZANidine (ZANAFLEX) 4 MG tablet Take 1 tablet (4 mg total) by mouth every 6 (six) hours as needed for muscle spasms. 40 tablet 0  . triamcinolone (NASACORT AQ) 55 MCG/ACT AERO nasal inhaler Place 2 sprays into the nose daily. 1 Inhaler 3  . Turmeric 500 MG CAPS Take 1 capsule by mouth daily.    Marland Kitchen azithromycin (ZITHROMAX Z-PAK) 250 MG tablet Take 1 tablet (250 mg total) by mouth daily. Take 2tabs on first day, then 1tab once a day till complete 6 tablet 0  . benzonatate (TESSALON) 200 MG capsule Take 1 capsule (200 mg total) by mouth every 6 (six) hours as needed for cough. 30 capsule 0  . traMADol (ULTRAM) 50 MG tablet Take 1 tablet (50 mg total) by mouth every 8 (eight) hours as needed. 60 tablet 0   No facility-administered medications prior to visit.     ROS Review of Systems  Constitutional: Negative for chills, fatigue and fever.  HENT: Negative.  Negative for sinus pressure and trouble swallowing.   Eyes: Negative.   Respiratory: Negative.  Negative for cough, chest tightness, shortness of breath and wheezing.   Cardiovascular: Negative for chest pain, palpitations and leg swelling.  Gastrointestinal: Negative for abdominal pain, constipation, diarrhea, nausea and vomiting.  Genitourinary: Negative.  Negative for difficulty urinating.  Musculoskeletal: Negative.  Negative for back pain and myalgias.  Skin: Positive for rash.  Neurological: Negative.  Negative for dizziness.  Hematological: Negative for adenopathy. Does not bruise/bleed easily.  Psychiatric/Behavioral: Negative.     Objective:  BP 124/74 (BP Location: Left Arm, Patient Position: Sitting, Cuff Size: Large)   Pulse (!) 59   Temp 98.4 F (36.9 C) (Oral)   Resp 16   Ht 5\' 3"  (1.6 m)   Wt 214  lb 8 oz (97.3 kg)   SpO2 98%   BMI 38.00 kg/m   BP Readings from Last 3 Encounters:  02/10/17 124/74  02/02/17 116/68  12/17/16 126/82    Wt Readings from Last 3 Encounters:  02/10/17 214 lb 8 oz (97.3 kg)  02/02/17 213 lb (96.6 kg)  12/17/16 201 lb (91.2 kg)    Physical Exam  Constitutional: She is oriented to person, place, and time. No distress.  HENT:  Mouth/Throat: Oropharynx is clear and moist. No oropharyngeal exudate.  Eyes: Conjunctivae are normal. Right eye exhibits no discharge. Left eye exhibits no discharge. No scleral icterus.  Neck: Normal range of motion. Neck supple. No JVD present. No tracheal deviation present. No thyromegaly present.  Cardiovascular: Normal rate, regular rhythm, normal heart sounds and intact distal pulses.  Exam reveals no gallop and no friction rub.   No murmur heard. Pulmonary/Chest: Effort normal and breath sounds normal. No stridor. No respiratory distress. She has no wheezes. She has no rales. She exhibits no tenderness.    Abdominal: Soft. Bowel sounds are normal. She exhibits no distension and no mass. There is no tenderness. There is no rebound and no guarding.  Musculoskeletal: Normal range of motion. She exhibits no edema, tenderness or deformity.  Lymphadenopathy:    She has no cervical adenopathy.  Neurological: She is oriented to person, place, and time.  Skin: Skin is warm and dry. Rash noted. She is not diaphoretic. No erythema. No pallor.  Vitals reviewed.   Lab Results  Component Value Date   WBC 10.5 07/21/2016   HGB 13.9 07/21/2016   HCT 40.8 07/21/2016   PLT 381.0 07/21/2016   GLUCOSE 91 09/18/2014   CHOL 172 09/18/2014   TRIG 115.0 09/18/2014   HDL 53.30 09/18/2014   LDLDIRECT 168.8 01/20/2012   LDLCALC 96 09/18/2014   ALT 21 09/18/2014   AST 21 09/18/2014   NA 138 09/18/2014   K 4.2 09/18/2014   CL 102 09/18/2014   CREATININE 0.9 09/18/2014   BUN 25 (H) 09/18/2014   CO2 29 09/18/2014   TSH 0.70  09/18/2014    Mm Screening Breast Tomo Bilateral  Result Date: 12/24/2016 CLINICAL DATA:  Screening. EXAM: 2D DIGITAL SCREENING BILATERAL MAMMOGRAM WITH CAD AND ADJUNCT TOMO COMPARISON:  Previous exam(s). ACR Breast Density Category b: There are scattered areas of fibroglandular density. FINDINGS: There are no findings suspicious for malignancy. Images were processed with CAD. IMPRESSION: No mammographic evidence of malignancy. A result letter of this screening mammogram will be mailed directly to the patient. RECOMMENDATION: Screening mammogram in one year. (Code:SM-B-01Y) BI-RADS CATEGORY  1: Negative. Electronically Signed   By: Lovey Newcomer M.D.   On: 12/24/2016 16:32    Assessment & Plan:   Vanesa was seen today for rash.  Diagnoses  and all orders for this visit:  Herpes zoster without complication- symptoms and exam are consistent with acute herpes zoster, will start a 3 week course of prednisone to decrease the acute pain and will treat the infection with valacyclovir. Will also offer Percocet as needed for the pain. -     predniSONE (DELTASONE) 5 MG tablet; Take 3 tablets (15 mg total) by mouth 2 (two) times daily with a meal. -     predniSONE (DELTASONE) 2.5 MG tablet; Take 3 tablets (7.5 mg total) by mouth 2 (two) times daily with a meal. -     valACYclovir (VALTREX) 1000 MG tablet; Take 1 tablet (1,000 mg total) by mouth 2 (two) times daily. -     oxyCODONE-acetaminophen (PERCOCET) 7.5-325 MG tablet; Take 1 tablet by mouth every 6 (six) hours as needed.  Other orders -     predniSONE (DELTASONE) 10 MG tablet; Take 3 tablets (30 mg total) by mouth 2 (two) times daily with a meal.   I have discontinued Ms. Menges's azithromycin, benzonatate, and traMADol. I am also having her start on predniSONE, predniSONE, predniSONE, valACYclovir, and oxyCODONE-acetaminophen. Additionally, I am having her maintain her cetirizine, Turmeric, Vitamin D3, sucralfate, HYDROcodone-acetaminophen,  losartan-hydrochlorothiazide, azelastine, triamcinolone, gabapentin, atorvastatin, diltiazem, fluticasone, guaiFENesin, tiZANidine, clotrimazole-betamethasone, metoprolol, DULoxetine, ranitidine, esomeprazole, and ferrous sulfate.  Meds ordered this encounter  Medications  . predniSONE (DELTASONE) 10 MG tablet    Sig: Take 3 tablets (30 mg total) by mouth 2 (two) times daily with a meal.    Dispense:  42 tablet    Refill:  0  . predniSONE (DELTASONE) 5 MG tablet    Sig: Take 3 tablets (15 mg total) by mouth 2 (two) times daily with a meal.    Dispense:  42 tablet    Refill:  0  . predniSONE (DELTASONE) 2.5 MG tablet    Sig: Take 3 tablets (7.5 mg total) by mouth 2 (two) times daily with a meal.    Dispense:  42 tablet    Refill:  0  . valACYclovir (VALTREX) 1000 MG tablet    Sig: Take 1 tablet (1,000 mg total) by mouth 2 (two) times daily.    Dispense:  14 tablet    Refill:  0  . oxyCODONE-acetaminophen (PERCOCET) 7.5-325 MG tablet    Sig: Take 1 tablet by mouth every 6 (six) hours as needed.    Dispense:  35 tablet    Refill:  0     Follow-up: Return in about 3 weeks (around 03/03/2017).  Scarlette Calico, MD

## 2017-02-16 ENCOUNTER — Ambulatory Visit (INDEPENDENT_AMBULATORY_CARE_PROVIDER_SITE_OTHER): Payer: Medicare HMO | Admitting: *Deleted

## 2017-02-16 VITALS — BP 126/84 | HR 57 | Resp 20 | Ht 63.0 in | Wt 215.0 lb

## 2017-02-16 DIAGNOSIS — Z0000 Encounter for general adult medical examination without abnormal findings: Secondary | ICD-10-CM

## 2017-02-16 NOTE — Patient Instructions (Addendum)
Start to eat heart healthy diet (full of fruits, vegetables, whole grains, lean protein, water--limit salt, fat, and sugar intake) and increase physical activity as tolerated.  Start doing brain stimulating activities (puzzles, reading, adult coloring books, staying active) to keep memory sharp.   [place] [id number]   [name] , Thank you for taking time to come for your Medicare Wellness Visit. I appreciate your ongoing commitment to your health goals. Please review the following plan we discussed and let me know if I can assist you in the future.   These are the goals we discussed: Goals    . lose 10 pounds          Begin to eat healthy, read food labels, do chair exercises and go to the [facility].    . Weight < 185 lb (83.915 kg)          Plan is to eat less "white" rice; pasta;  Given information on an 1800 calorie       This is a list of the screening recommended for you and due dates:  Health Maintenance  Topic Date Due  . Tetanus Vaccine  01/16/2018*  . Flu Shot  05/19/2017  . Mammogram  12/25/2018  . Colon Cancer Screening  12/11/2019  . DEXA scan (bone density measurement)  Completed  .  Hepatitis C: One time screening is recommended by Center for Disease Control  (CDC) for  adults born from [birth year] through 1964.   Completed  . Pneumonia vaccines  Completed  *Topic was postponed. The date shown is not the original due date.    Diabetes Mellitus and Food It is important for you to manage your blood sugar (glucose) level. Your blood glucose level can be greatly affected by what you eat. Eating healthier foods in the appropriate amounts throughout the day at about the same time each day will help you control your blood glucose level. It can also help slow or prevent worsening of your diabetes mellitus. Healthy eating may even help you improve the level of your blood pressure and reach or maintain a healthy weight. General recommendations for healthful  eating and cooking habits include:  Eating meals and snacks regularly. Avoid going long periods of time without eating to lose weight.  Eating a diet that consists mainly of plant-based foods, such as fruits, vegetables, nuts, legumes, and whole grains.  Using low-heat cooking methods, such as baking, instead of high-heat cooking methods, such as deep frying. Work with your dietitian to make sure you understand how to use the Nutrition Facts information on food labels. How can food affect me? Carbohydrates  Carbohydrates affect your blood glucose level more than any other type of food. Your dietitian will help you determine how many carbohydrates to eat at each meal and teach you how to count carbohydrates. Counting carbohydrates is important to keep your blood glucose at a healthy level, especially if you are using insulin or taking certain medicines for diabetes mellitus. Alcohol  Alcohol can cause sudden decreases in blood glucose (hypoglycemia), especially if you use insulin or take certain medicines for diabetes mellitus. Hypoglycemia can be a life-threatening condition. Symptoms of hypoglycemia (sleepiness, dizziness, and disorientation) are similar to symptoms of having too much alcohol. If your health care provider has given you approval to drink alcohol, do so in moderation and use the following guidelines:  Women should not have more than one drink per day, and men should not have more than two drinks per  day. One drink is equal to:  12 oz of beer.  5 oz of [place].  1 oz of hard liquor.  Do not drink on an empty stomach.  Keep yourself hydrated. Have water, diet soda, or unsweetened iced tea.  Regular soda, juice, and other mixers might contain a lot of carbohydrates and should be counted. What foods are not recommended? As you make food choices, it is important to remember that all foods are not the same. Some foods have fewer nutrients per serving than other foods, even though  they might have the same number of calories or carbohydrates. It is difficult to get your body what it needs when you eat foods with fewer nutrients. Examples of foods that you should avoid that are high in calories and carbohydrates but low in nutrients include:  Trans fats (most processed foods list trans fats on the Nutrition Facts label).  Regular soda.  Juice.  Candy.  Sweets, such as cake, pie, doughnuts, and cookies.  Fried foods. What foods can I eat? Eat nutrient-rich foods, which will nourish your body and keep you healthy. The food you should eat also will depend on several factors, including:  The calories you need.  The medicines you take.  Your weight.  Your blood glucose level.  Your blood pressure level.  Your cholesterol level. You should eat a variety of foods, including:  Protein.  Lean cuts of meat.  Proteins low in saturated fats, such as fish, egg whites, and beans. Avoid processed meats.  Fruits and vegetables.  Fruits and vegetables that may help control blood glucose levels, such as apples, mangoes, and yams.  Dairy products.  Choose fat-free or low-fat dairy products, such as milk, yogurt, and cheese.  Grains, bread, pasta, and rice.  Choose whole grain products, such as multigrain bread, whole oats, and brown rice. These foods may help control blood pressure.  Fats.  Foods containing healthful fats, such as nuts, avocado, olive oil, canola oil, and fish. Does everyone with diabetes mellitus have the same meal plan? Because every person with diabetes mellitus is different, there is not one meal plan that works for everyone. It is very important that you meet with a dietitian who will help you create a meal plan that is just right for you. This information is not intended to replace advice given to you by your health care provider. Make sure you discuss any questions you have with your health care provider. Document Released: 07/02/2005  Document Revised: 03/12/2016 Document Reviewed: 09/01/2013 Elsevier Interactive Patient Education  2017 [place] DASH stands for "Dietary Approaches to Stop Hypertension." The DASH eating plan is a healthy eating plan that has been shown to reduce high blood pressure (hypertension). It may also reduce your risk for type 2 diabetes, heart disease, and stroke. The DASH eating plan may also help with weight loss. What are tips for following this plan? General guidelines   Avoid eating more than 2,300 mg (milligrams) of salt (sodium) a day. If you have hypertension, you may need to reduce your sodium intake to 1,500 mg a day.  Limit alcohol intake to no more than 1 drink a day for nonpregnant women and 2 drinks a day for men. One drink equals 12 oz of beer, 5 oz of [place]ern[place], or 1 oz of hard liquor.  Work with your health care provider to maintain a healthy body weight or to lose weight. Ask what an ideal weight is for you.  Get at least 30 minutes of exercise that causes your heart to beat faster (aerobic exercise) most days of the week. Activities may include walking, swimming, or biking.  Work with your health care provider or diet and nutrition specialist (dietitian) to adjust your eating plan to your individual calorie needs. Reading food labels   Check food labels for the amount of sodium per serving. Choose foods with less than 5 percent of the Daily Value of sodium. Generally, foods with less than 300 mg of sodium per serving fit into this eating plan.  To find whole grains, look for the word "whole" as the first word in the ingredient list. Shopping   Buy products labeled as "low-sodium" or "no salt added."  Buy fresh foods. Avoid canned foods and premade or frozen meals. Cooking   Avoid adding salt when cooking. Use salt-free seasonings or herbs instead of table salt or sea salt. Check with your health care provider or pharmacist before using salt  substitutes.  Do not fry foods. Cook foods using healthy methods such as baking, boiling, grilling, and broiling instead.  Cook with heart-healthy oils, such as olive, canola, soybean, or sunflower oil. Meal planning    Eat a balanced diet that includes:  5 or more servings of fruits and vegetables each day. At each meal, try to fill half of your plate with fruits and vegetables.  Up to 6-8 servings of whole grains each day.  Less than 6 oz of lean meat, poultry, or fish each day. A 3-oz serving of meat is about the same size as a deck of cards. One egg equals 1 oz.  2 servings of low-fat dairy each day.  A serving of nuts, seeds, or beans 5 times each week.  Heart-healthy fats. Healthy fats called Omega-3 fatty acids are found in foods such as flaxseeds and coldwater fish, like sardines, salmon, and mackerel.  Limit how much you eat of the following:  Canned or prepackaged foods.  Food that is high in trans fat, such as fried foods.  Food that is high in saturated fat, such as fatty meat.  Sweets, desserts, sugary drinks, and other foods with added sugar.  Full-fat dairy products.  Do not salt foods before eating.  Try to eat at least 2 vegetarian meals each week.  Eat more home-cooked food and less restaurant, buffet, and fast food.  When eating at a restaurant, ask that your food be prepared with less salt or no salt, if possible. What foods are recommended? The items listed may not be a complete list. Talk with your dietitian about what dietary choices are best for you. Grains  Whole-grain or whole-wheat bread. Whole-grain or whole-wheat pasta. Brown rice. [place]. Bulgur. Whole-grain and low-sodium cereals. Pita bread. Low-fat, low-sodium crackers. Whole-wheat flour tortillas. Vegetables  Fresh or frozen vegetables (raw, steamed, roasted, or grilled). Low-sodium or reduced-sodium tomato and vegetable juice. Low-sodium or reduced-sodium tomato sauce and  tomato paste. Low-sodium or reduced-sodium canned vegetables. Fruits  All fresh, dried, or frozen fruit. Canned fruit in natural juice (without added sugar). Meat and other protein foods  Skinless chicken or [place]. Ground chicken or [place]. Pork with fat trimmed off. Fish and seafood. Egg whites. Dried beans, peas, or lentils. Unsalted nuts, nut butters, and seeds. Unsalted canned beans. Lean cuts of beef with fat trimmed off. Low-sodium, lean deli meat. Dairy  Low-fat (1%) or fat-free (skim) milk. Fat-free, low-fat, or reduced-fat cheeses. Nonfat, low-sodium ricotta or cottage cheese. Low-fat or nonfat yogurt.  Low-fat, low-sodium cheese. Fats and oils  Soft margarine without trans fats. Vegetable oil. Low-fat, reduced-fat, or light mayonnaise and salad dressings (reduced-sodium). Canola, safflower, olive, soybean, and sunflower oils. Avocado. Seasoning and other foods  Herbs. Spices. Seasoning mixes without salt. Unsalted popcorn and pretzels. Fat-free sweets. What foods are not recommended? The items listed may not be a complete list. Talk with your dietitian about what dietary choices are best for you. Grains  Baked goods made with fat, such as croissants, muffins, or some breads. Dry pasta or rice meal packs. Vegetables  Creamed or fried vegetables. Vegetables in a cheese sauce. Regular canned vegetables (not low-sodium or reduced-sodium). Regular canned tomato sauce and paste (not low-sodium or reduced-sodium). Regular tomato and vegetable juice (not low-sodium or reduced-sodium). Angie Fava. Olives. Fruits  Canned fruit in a light or heavy syrup. Fried fruit. Fruit in cream or butter sauce. Meat and other protein foods  Fatty cuts of meat. Ribs. Fried meat. Berniece Salines. Sausage. Bologna and other processed lunch meats. Salami. Fatback. Hotdogs. Bratwurst. Salted nuts and seeds. Canned beans with added salt. Canned or smoked fish. Whole eggs or egg yolks. Chicken or [place] with skin. Dairy  Whole  or 2% milk, cream, and half-and-half. Whole or full-fat cream cheese. Whole-fat or sweetened yogurt. Full-fat cheese. Nondairy creamers. Whipped toppings. Processed cheese and cheese spreads. Fats and oils  Butter. Stick margarine. Lard. Shortening. Ghee. Bacon fat. Tropical oils, such as coconut, palm kernel, or palm oil. Seasoning and other foods  Salted popcorn and pretzels. Onion salt, garlic salt, seasoned salt, table salt, and sea salt. Worcestershire sauce. Tartar sauce. Barbecue sauce. Teriyaki sauce. Soy sauce, including reduced-sodium. Steak sauce. Canned and packaged gravies. Fish sauce. Oyster sauce. Cocktail sauce. Horseradish that you find on the shelf. Ketchup. Mustard. Meat flavorings and tenderizers. Bouillon cubes. Hot sauce and Tabasco sauce. Premade or packaged marinades. Premade or packaged taco seasonings. Relishes. Regular salad dressings. Where to find more information:  National Heart, Lung, and [place]: https://wilson-eaton.com/  American Heart Association: www.heart.org Summary  The DASH eating plan is a healthy eating plan that has been shown to reduce high blood pressure (hypertension). It may also reduce your risk for type 2 diabetes, heart disease, and stroke.  With the DASH eating plan, you should limit salt (sodium) intake to 2,300 mg a day. If you have hypertension, you may need to reduce your sodium intake to 1,500 mg a day.  When on the DASH eating plan, aim to eat more fresh fruits and vegetables, whole grains, lean proteins, low-fat dairy, and heart-healthy fats.  Work with your health care provider or diet and nutrition specialist (dietitian) to adjust your eating plan to your individual calorie needs. This information is not intended to replace advice given to you by your health care provider. Make sure you discuss any questions you have with your health care provider. Document Released: 09/24/2011 Document Revised: 09/28/2016 Document Reviewed:  09/28/2016 Elsevier Interactive Patient Education  2017 [place] that are packaged or in containers have a Nutrition Facts panel on the side or back. This is commonly called the food label. The food label helps you make healthy food choices by providing information about serving size and the amount of calories and various nutrients in the food. You can check the food label to find out if the food contains high or low amounts of nutrients that you want to limit in your diet. You can also use the food label to see if the  food is a good source of the nutrients that you want to make sure are included in your diet. How do I read the food label?  Begin by checking the serving size and number of servings in the container. All of the nutrition information listed on the food label is based on one serving. If you eat more than one serving, you must multiply the amounts (such as calories, grams of saturated fat, or milligrams of sodium) by the number of servings.  Check the calories. Choosing foods that are low in calories can help you manage your weight.  Look at the numbers in the % Daily Value column for each listed nutrient. This gives you an idea of how much of the daily recommended amount for that nutrient is provided in one serving of the food. A daily value of 5% or less is considered low. A daily value of 20% or more is considered high.  Check the amounts for the items you should limit in your diet. These include:  Total fat.  Saturated fat.  Trans fat.  Cholesterol.  Sodium.  Check the amounts for the items you should make sure you get enough of. These include:  Dietary fiber.  Vitamins A and C.  Calcium.  Iron. What information is provided on the food label? Serving information   Serving size.  The serving size is listed in cups or pieces. The nutrient amounts listed on the food label apply to this amount of the food.  Servings per container or  package.  This shows the number of servings you can expect to get from the container or package if you follow the suggested serving size. Amount per serving   Calories.  The number of calories in one serving of the food. This information is helpful in managing weight. Low-calorie foods contain 40 calories or less. High-calorie foods contain 400 or more calories.  Calories from fat.  The number of calories that come from fat in one serving. Percent daily value  Percent daily value (shown on the label as % Daily Value) tells you what percent of the daily value for each nutrient one serving provides. The daily value is the recommended amount of the nutrient that you should get each day. For example, if 15% is listed next to dietary fiber, it means that one serving of the food will give you 15% of the recommended amount of fiber that you should get in a day. The daily values are based on a 2,000-calorie-per-day diet. You may get more or less than 2,000 calories in your diet each day, but the % Daily Value gives you an idea of whether the food contains a high or low amount of the listed nutrient. A daily value of 5% or less is low. A daily value of 20% or more is high. Total fat  Total fat shows you the total amount of fat in one serving (listed in grams). Foods with high amounts of fat usually have higher calories and may lead to weight gain. Two of the fats that make up a portion of the total fat are included on the label:  Saturated fat.  This number is the amount of saturated fat in one serving (listed in grams). Saturated fat increases the amount of blood cholesterol and should be limited to less than 7% of total calories each day. This means that if you eat 2,000 calories each day, you should eat less than 140 calories from saturated fat.   Cholesterol  The  amount of cholesterol in one serving is listed in milligrams. Cholesterol should be limited to no more than 300 mg each day. Sodium   The amount of sodium in one serving is listed in milligrams. Most people should limit their sodium intake to 2,300 mg a day. Total carbohydrate  This number shows the amount of total carbohydrate in one serving (listed in grams). This information can help people with diabetes manage the amount of carbohydrate they eat. Two of the carbohydrates that make up a portion of the total carbohydrate are included on the label:  Dietary fiber.  The amount of dietary fiber in one serving is listed in grams. Most people should eat at least 25 g of dietary fiber each day.  Sugars.  The amount of sugar in one serving is also listed in grams. This value includes both naturally occurring sugars from fruit and milk and added sugars such as honey or table sugar. Protein  The amount of protein in one serving is listed in grams. What other important labeling is on the food package? Ingredients  Food labels will list each ingredient in the food. The first ingredient listed is the ingredient that the food has the most of. The ingredients are listed in the order of their amount by weight from highest to lowest. Food allergen labeling  Food labels may also include a food allergen warning. Listed here are ingredients that can cause allergic reactions in some people. The potential allergens are listed behind the word "Contains" or "May contain." Examples of ingredients that may be listed are wheat, dairy, eggs, soy, and nuts. If a person knows that he or she is allergic to one of these ingredients, he or she will know to avoid that food. Where to find more information:  U.S. Food and Drug Administration: [place].ch This information is not intended to replace advice given to you by your health care provider. Make sure you discuss any questions you have with your health care provider. Document Released: 10/05/2005 Document Revised: 06/03/2016 Document Reviewed: 08/28/2013 Elsevier Interactive Patient Education  2017  [employer]vier Inc[employer]  Fat and Cholesterol Restricted Diet High levels of fat and cholesterol in your blood may lead to various health problems, such as diseases of the heart, blood vessels, gallbladder, liver, and pancreas. Fats are concentrated sources of energy that come in various forms. Certain types of fat, including saturated fat, may be harmful in excess. Cholesterol is a substance needed by your body in small amounts. Your body makes all the cholesterol it needs. Excess cholesterol comes from the food you eat. When you have high levels of cholesterol and saturated fat in your blood, health problems can develop because the excess fat and cholesterol will gather along the walls of your blood vessels, causing them to narrow. Choosing the right foods will help you control your intake of fat and cholesterol. This will help keep the levels of these substances in your blood within normal limits and reduce your risk of disease. What is my plan? Your health care provider recommends that you:  Limit your fat intake to ______% or less of your total calories per day.  Limit the amount of cholesterol in your diet to less than _________mg per day.  Eat 20-30 grams of fiber each day. What types of fat should I choose?  Choose healthy fats more often. Choose monounsaturated and polyunsaturated fats, such as olive and canola oil, flaxseeds, walnuts, almonds, and seeds.  Eat more omega-3 fats. Good choices include salmon, mackerel,  sardines, tuna, flaxseed oil, and ground flaxseeds. Aim to eat fish at least two times a week.  Limit saturated fats. Saturated fats are primarily found in animal products, such as meats, butter, and cream. Plant sources of saturated fats include palm oil, palm kernel oil, and coconut oil.  Avoid foods with partially hydrogenated oils in them. These contain trans fats. Examples of foods that contain trans fats are stick margarine, some tub margarines, cookies, crackers, and other  baked goods. What general guidelines do I need to follow? These guidelines for healthy eating will help you control your intake of fat and cholesterol:  Check food labels carefully to identify foods with trans fats or high amounts of saturated fat.  Fill one half of your plate with vegetables and green salads.  Fill one fourth of your plate with whole grains. Look for the word "whole" as the first word in the ingredient list.  Fill one fourth of your plate with lean protein foods.  Limit fruit to two servings a day. Choose fruit instead of juice.  Eat more foods that contain fiber, such as apples, broccoli, carrots, beans, peas, and barley.  Eat more home-cooked food and less restaurant, buffet, and fast food.  Limit or avoid alcohol.  Limit foods high in starch and sugar.  Limit fried foods.  Cook foods using methods other than frying. Baking, boiling, grilling, and broiling are all great options.  Lose weight if you are overweight. Losing just 5-10% of your initial body weight can help your overall health and prevent diseases such as diabetes and heart disease. What foods can I eat? Grains   Whole grains, such as whole wheat or whole grain breads, crackers, cereals, and pasta. Unsweetened oatmeal, bulgur, barley, quinoa, or brown rice. Corn or whole wheat flour tortillas. Vegetables   Fresh or frozen vegetables (raw, steamed, roasted, or grilled). Green salads. Fruits   All fresh, canned (in natural juice), or frozen fruits. Meats and other protein foods   Ground beef (85% or leaner), grass-fed beef, or beef trimmed of fat. Skinless chicken or [place]. Ground chicken or [place]. Pork trimmed of fat. All fish and seafood. Eggs. Dried beans, peas, or lentils. Unsalted nuts or seeds. Unsalted canned or dry beans. Dairy   Low-fat dairy products, such as skim or 1% milk, 2% or reduced-fat cheeses, low-fat ricotta or cottage cheese, or plain low-fat yo Fats and oils   Tub  margarines without trans fats. Light or reduced-fat mayonnaise and salad dressings. Avocado. Olive, canola, sesame, or safflower oils. Natural peanut or almond butter (choose ones without added sugar and oil). The items listed above may not be a complete list of recommended foods or beverages. Contact your dietitian for more options.  Foods to avoid Grains   White bread. White pasta. White rice. Cornbread. Bagels, pastries, and croissants. Crackers that contain trans fat. Vegetables   White potatoes. Corn. Creamed or fried vegetables. Vegetables in a cheese sauce. Fruits   Dried fruits. Canned fruit in light or heavy syrup. Fruit juice. Meats and other protein foods   Fatty cuts of meat. Ribs, chicken wings, bacon, sausage, bologna, salami, chitterlings, fatback, hot dogs, bratwurst, and packaged luncheon meats. Liver and organ meats. Dairy   Whole or 2% milk, cream, half-and-half, and cream cheese. Whole milk cheeses. Whole-fat or sweetened yogurt. Full-fat cheeses. Nondairy creamers and whipped toppings. Processed cheese, cheese spreads, or cheese curds. Beverages   Alcohol. Sweetened drinks (such as sodas, lemonade, and fruit drinks or punches). Fats and  oils   Butter, stick margarine, lard, shortening, ghee, or bacon fat. Coconut, palm kernel, or palm oils. Sweets and desserts   Corn syrup, sugars, honey, and molasses. Candy. Jam and jelly. Syrup. Sweetened cereals. Cookies, pies, cakes, donuts, muffins, and ice cream. The items listed above may not be a complete list of foods and beverages to avoid. Contact your dietitian for more information.  This information is not intended to replace advice given to you by your health care provider. Make sure you discuss any questions you have with your health care provider. Document Released: 10/05/2005 Document Revised: 10/26/2014 Document Reviewed: 01/03/2014 Elsevier Interactive Patient Education  2017 [place] and  Cholesterol Restricted Diet Getting too much fat and cholesterol in your diet may cause health problems. Following this diet helps keep your fat and cholesterol at normal levels. This can keep you from getting sick. What types of fat should I choose?  Choose monosaturated and polyunsaturated fats. These are found in foods such as olive oil, canola oil, flaxseeds, walnuts, almonds, and seeds.  Eat more omega-3 fats. Good choices include salmon, mackerel, sardines, tuna, flaxseed oil, and ground flaxseeds.  Limit saturated fats. These are in animal products such as meats, butter, and cream. They can also be in plant products such as palm oil, palm kernel oil, and coconut oil.  Avoid foods with partially hydrogenated oils in them. These contain trans fats. Examples of foods that have trans fats are stick margarine, some tub margarines, cookies, crackers, and other baked goods. What general guidelines do I need to follow?  Check food labels. Look for the words "trans fat" and "saturated fat."  When preparing a meal:  Fill half of your plate with vegetables and green salads.  Fill one fourth of your plate with whole grains. Look for the word "whole" as the first word in the ingredient list.  Fill one fourth of your plate with lean protein foods.  Eat more foods that have fiber, like apples, carrots, beans, peas, and barley.  Eat more home-cooked foods. Eat less at restaurants and buffets.  Limit or avoid alcohol.  Limit foods high in starch and sugar.  Limit fried foods.  Cook foods without frying them. Baking, boiling, grilling, and broiling are all great options.  Lose weight if you are overweight. Losing even a small amount of weight can help your overall health. It can also help prevent diseases such as diabetes and heart disease. What foods can I eat? Grains  Whole grains, such as whole wheat or whole grain breads, crackers, cereals, and pasta. Unsweetened oatmeal, bulgur,  barley, quinoa, or brown rice. Corn or whole wheat flour tortillas. Vegetables  Fresh or frozen vegetables (raw, steamed, roasted, or grilled). Green salads. Fruits  All fresh, canned (in natural juice), or frozen fruits. Meat and Other Protein Products  Ground beef (85% or leaner), grass-fed beef, or beef trimmed of fat. Skinless chicken or [place]. Ground chicken or [place]. Pork trimmed of fat. All fish and seafood. Eggs. Dried beans, peas, or lentils. Unsalted nuts or seeds. Unsalted canned or dry beans. Dairy  Low-fat dairy products, such as skim or 1% milk, 2% or reduced-fat cheeses, low-fat ricotta or cottage cheese, or plain low-fat yogurt. Fats and Oils  Tub margarines without trans fats. Light or reduced-fat mayonnaise and salad dressings. Avocado. Olive, canola, sesame, or safflower oils. Natural peanut or almond butter (choose ones without added sugar and oil). The items listed above may not be a complete list  of recommended foods or beverages. Contact your dietitian for more options.  What foods are not recommended? Grains  White bread. White pasta. White rice. Cornbread. Bagels, pastries, and croissants. Crackers that contain trans fat. Vegetables  White potatoes. Corn. Creamed or fried vegetables. Vegetables in a cheese sauce. Fruits  Dried fruits. Canned fruit in light or heavy syrup. Fruit juice. Meat and Other Protein Products  Fatty cuts of meat. Ribs, chicken wings, bacon, sausage, bologna, salami, chitterlings, fatback, hot dogs, bratwurst, and packaged luncheon meats. Liver and organ meats. Dairy  Whole or 2% milk, cream, half-and-half, and cream cheese. Whole milk cheeses. Whole-fat or sweetened yogurt. Full-fat cheeses. Nondairy creamers and whipped toppings. Processed cheese, cheese spreads, or cheese curds. Sweets and Desserts  Corn syrup, sugars, honey, and molasses. Candy. Jam and jelly. Syrup. Sweetened cereals. Cookies, pies, cakes, donuts, muffins, and ice  cream. Fats and Oils  Butter, stick margarine, lard, shortening, ghee, or bacon fat. Coconut, palm kernel, or palm oils. Beverages  Alcohol. Sweetened drinks (such as sodas, lemonade, and fruit drinks or punches). The items listed above may not be a complete list of foods and beverages to avoid. Contact your dietitian for more information.  This information is not intended to replace advice given to you by your health care provider. Make sure you discuss any questions you have with your health care provider. Document Released: 04/05/2012 Document Revised: 06/11/2016 Document Reviewed: 01/04/2014 Elsevier Interactive Patient Education  2017 [place] in the Home Falls can cause injuries. They can happen to people of all ages. There are many things you can do to make your home safe and to help prevent falls. What can I do on the outside of my home?  Regularly fix the edges of walkways and driveways and fix any cracks.  Remove anything that might make you trip as you walk through a door, such as a raised step or threshold.  Trim any bushes or trees on the path to your home.  Use bright outdoor lighting.  Clear any walking paths of anything that might make someone trip, such as rocks or tools.  Regularly check to see if handrails are loose or broken. Make sure that both sides of any steps have handrails.  Any raised decks and porches should have guardrails on the edges.  Have any leaves, snow, or ice cleared regularly.  Use sand or salt on walking paths during winter.  Clean up any spills in your garage right away. This includes oil or grease spills. What can I do in the bathroom?  Use night lights.  Install grab bars by the toilet and in the tub and shower. Do not use towel bars as grab bars.  Use non-skid mats or decals in the tub or shower.  If you need to sit down in the shower, use a plastic, non-slip stool.  Keep the floor dry. Clean up any water  that spills on the floor as soon as it happens.  Remove soap buildup in the tub or shower regularly.  Attach bath mats securely with double-sided non-slip rug tape.  Do not have throw rugs and other things on the floor that can make you trip. What can I do in the bedroom?  Use night lights.  Make sure that you have a light by your bed that is easy to reach.  Do not use any sheets or blankets that are too big for your bed. They should not hang down onto the floor.  Have a firm chair that has side arms. You can use this for support while you get dressed.  Do not have throw rugs and other things on the floor that can make you trip. What can I do in the kitchen?  Clean up any spills right away.  Avoid walking on wet floors.  Keep items that you use a lot in easy-to-reach places.  If you need to reach something above you, use a strong step stool that has a grab bar.  Keep electrical cords out of the way.  Do not use floor polish or wax that makes floors slippery. If you must use wax, use non-skid floor wax.  Do not have throw rugs and other things on the floor that can make you trip. What can I do with my stairs?  Do not leave any items on the stairs.  Make sure that there are handrails on both sides of the stairs and use them. Fix handrails that are broken or loose. Make sure that handrails are as long as the stairways.  Check any carpeting to make sure that it is firmly attached to the stairs. Fix any carpet that is loose or worn.  Avoid having throw rugs at the top or bottom of the stairs. If you do have throw rugs, attach them to the floor with carpet tape.  Make sure that you have a light switch at the top of the stairs and the bottom of the stairs. If you do not have them, ask someone to add them for you. What else can I do to help prevent falls?  Wear shoes that:  Do not have high heels.  Have rubber bottoms.  Are comfortable and fit you well.  Are closed at  the toe. Do not wear sandals.  If you use a stepladder:  Make sure that it is fully opened. Do not climb a closed stepladder.  Make sure that both sides of the stepladder are locked into place.  Ask someone to hold it for you, if possible.  Clearly mark and make sure that you can see:  Any grab bars or handrails.  First and last steps.  Where the edge of each step is.  Use tools that help you move around (mobility aids) if they are needed. These include:  Canes.  Walkers.  Scooters.  Crutches.  Turn on the lights when you go into a dark area. Replace any light bulbs as soon as they burn out.  Set up your furniture so you have a clear path. Avoid moving your furniture around.  If any of your floors are uneven, fix them.  If there are any pets around you, be aware of where they are.  Review your medicines with your doctor. Some medicines can make you feel dizzy. This can increase your chance of falling. Ask your doctor what other things that you can do to help prevent falls. This information is not intended to replace advice given to you by your health care provider. Make sure you discuss any questions you have with your health care provider. Document Released: 08/01/2009 Document Revised: 03/12/2016 Document Reviewed: 11/09/2014 Elsevier Interactive Patient Education  2017 [employer].

## 2017-02-16 NOTE — Progress Notes (Signed)
Pre visit review using our clinic review tool, if applicable. No additional management support is needed unless otherwise documented below in the visit note. 

## 2017-02-16 NOTE — Progress Notes (Addendum)
Subjective:   [name] is a 71 y.o. female who presents for Medicare Annual (Subsequent) preventive examination.  Review of Systems:  No ROS.  Medicare Wellness Visit.  Cardiac Risk Factors include: advanced age (>46men, >32 women);dyslipidemia;hypertension;obesity (BMI >30kg/m2);sedentary lifestyle Sleep patterns: feels rested on waking, gets up 2-3 times nightly to void and sleeps 7-9 hours nightly.   Home Safety/Smoke Alarms: Feels safe in home. Smoke alarms in place.    Living environment; residence and Firearm Safety: 1-story house/ trailer, equipment: Radio producer, Type: [place], no firearms. Lives with son, no needs for DME Seat Belt Safety/Bike Helmet: Wears seat belt.   Counseling:   Eye Exam- appointment yearly Dr. [facility]- recently obtained dental insurance, patient states she will make an appointment soon  Female:   Pap- N/A      Mammo- Last 12/24/16,  BI-RADS CATEGORY  1: Negative     Dexa scan-  Last 01/22/13, normal      CCS- Last 12/10/14, history of precancerous polyps, recall 5 years     Objective:     Vitals: BP 126/84   Pulse (!) 57   Resp 20   Ht 5\' 3"  (1.6 m)   Wt 215 lb (97.5 kg)   SpO2 99%   BMI 38.09 kg/m   Body mass index is 38.09 kg/m.   Tobacco History  Smoking Status  . Never Smoker  Smokeless Tobacco  . Never Used     Counseling given: Not Answered   Past Medical History:  Diagnosis Date  . Allergic rhinitis, cause unspecified   . Allergy    SEASONAL  . Anemia   . Chronic headaches   . Diverticulosis   . DJD (degenerative joint disease), lumbar   . GERD (gastroesophageal reflux disease)   . Hemorrhoids   . Hiatal hernia   . HTN (hypertension)   . Hyperlipidemia   . Obesity   . Osteoarthritis   . Tubular adenoma of colon    Past Surgical History:  Procedure Laterality Date  . ABLATION    . COLONOSCOPY    . TUBAL LIGATION     Family History  Problem Relation Age of Onset  . Heart disease Mother   .  Hypertension Mother   . Diabetes Brother   . Colon cancer Neg Hx   . Esophageal cancer Neg Hx   . Rectal cancer Neg Hx   . Stomach cancer Neg Hx    History  Sexual Activity  . Sexual activity: No    Outpatient Encounter Prescriptions as of 02/16/2017  Medication Sig  . atorvastatin (LIPITOR) 20 MG tablet TAKE 1 TABLET EVERY DAY  . azelastine (ASTELIN) 0.1 % nasal spray instill 1 to 2 sprays into each nostril at bedtime  . cetirizine (ZYRTEC) 10 MG tablet Take 1 tablet (10 mg total) by mouth daily. As needed  . Cholecalciferol (VITAMIN D3) 2000 UNITS capsule Take 2,000 Units by mouth daily.  Marland Kitchen diltiazem (TIAZAC) 120 MG 24 hr capsule TAKE 1 CAPSULE EVERY DAY  . DULoxetine (CYMBALTA) 20 MG capsule Take 1 capsule (20 mg total) by mouth daily.  Marland Kitchen esomeprazole (NEXIUM) 40 MG capsule Take 1 capsule (40 mg total) by mouth daily.  . ferrous sulfate 325 (65 FE) MG tablet Take 1 tablet (325 mg total) by mouth daily with breakfast.  . fluticasone (FLONASE) 50 MCG/ACT nasal spray Place 2 sprays into both nostrils daily.  Marland Kitchen gabapentin (NEURONTIN) 300 MG capsule Take 1 capsule (300 mg total) by mouth 3 (  three) times daily.  Marland Kitchen guaiFENesin (MUCINEX) 600 MG 12 hr tablet Take 1 tablet (600 mg total) by mouth 2 (two) times daily as needed for cough or to loosen phlegm.  Marland Kitchen losartan-hydrochlorothiazide (HYZAAR) 100-25 MG tablet Take 1 tablet by mouth daily.  . metoprolol (LOPRESSOR) 50 MG tablet TAKE 1 TABLET TWICE DAILY  . oxyCODONE-acetaminophen (PERCOCET) 7.5-325 MG tablet Take 1 tablet by mouth every 6 (six) hours as needed.  . predniSONE (DELTASONE) 5 MG tablet Take 3 tablets (15 mg total) by mouth 2 (two) times daily with a meal.  . ranitidine (ZANTAC) 150 MG tablet Take 1 tablet (150 mg total) by mouth 2 (two) times daily as needed for heartburn.  . sucralfate (CARAFATE) 1 G tablet Take 1 g by mouth as needed. Reported on 12/20/2015  . tiZANidine (ZANAFLEX) 4 MG tablet Take 1 tablet (4 mg total) by mouth  every 6 (six) hours as needed for muscle spasms.  Marland Kitchen triamcinolone (NASACORT AQ) 55 MCG/ACT AERO nasal inhaler Place 2 sprays into the nose daily.  . Turmeric 500 MG CAPS Take 1 capsule by mouth daily.  . valACYclovir (VALTREX) 1000 MG tablet Take 1 tablet (1,000 mg total) by mouth 2 (two) times daily.  . [DISCONTINUED] HYDROcodone-acetaminophen (NORCO/VICODIN) 5-325 MG tablet Take 1 tablet by mouth every 6 (six) hours as needed for moderate pain. (Patient not taking: Reported on 02/16/2017)  . [DISCONTINUED] predniSONE (DELTASONE) 10 MG tablet Take 3 tablets (30 mg total) by mouth 2 (two) times daily with a meal. (Patient not taking: Reported on 02/16/2017)  . [DISCONTINUED] predniSONE (DELTASONE) 2.5 MG tablet Take 3 tablets (7.5 mg total) by mouth 2 (two) times daily with a meal. (Patient not taking: Reported on 02/16/2017)   No facility-administered encounter medications on file as of 02/16/2017.     Activities of Daily Living In your present state of health, do you have any difficulty performing the following activities: 02/16/2017  Hearing? N  Vision? N  Difficulty concentrating or making decisions? N  Walking or climbing stairs? Y  Dressing or bathing? N  Doing errands, shopping? N  Preparing Food and eating ? N  Using the Toilet? N  In the past six months, have you accidently leaked urine? N  Do you have problems with loss of bowel control? N  Managing your Medications? N  Managing your Finances? N  Housekeeping or managing your Housekeeping? N  Some recent data might be hidden    Patient Care Team: Biagio Borg, MD as PCP - General (Internal Medicine)    Assessment:    Physical assessment deferred to PCP.  Exercise Activities and Dietary recommendations Current Exercise Habits: The patient does not participate in regular exercise at present (chair exercise pamphlets and [facility] information provided), Exercise limited by: orthopedic condition(s)   Diet (meal preparation, eat  out, water intake, caffeinated beverages, dairy products, fruits and vegetables): in general, a "healthy" diet  , on average, 3 meals per day. Patient reports that she has improved her eating habits, limits salt, fat, fried foods. Drinks 2-3 sodas per week, limits caffeine, drinks 6-9 glasses of water per day.  Discussed weight loss strategies, reading labels, low salt, low fat/cholesterol, low sugar/carbohydrate diet. Diet education attached to patient's AVS.      Goals    . lose 10 pounds          Begin to eat healthy, read food labels, do chair exercises and go to the [facility].    . Weight <  185 lb (83.915 kg)          Plan is to eat less "white" rice; pasta;  Given information on an 1800 calorie      Fall Risk Fall Risk  02/16/2017 03/31/2016 12/20/2015 03/27/2015 03/15/2014  Falls in the past year? Yes Yes No No No  Number falls in past yr: 2 or more 1 - - -  Injury with Fall? Yes Yes - - -  Risk Factor Category  High Fall Risk - - - -  Risk for fall due to : Impaired balance/gait;Impaired mobility - - - -  Follow up Falls prevention discussed;Education provided - - - -   Depression Screen PHQ 2/9 Scores 02/16/2017 03/31/2016 12/20/2015 03/27/2015  PHQ - 2 Score 1 0 0 0     Cognitive Function MMSE - Mini Mental State Exam 02/16/2017  Orientation to time 5  Orientation to Place 5  Registration 3  Attention/ Calculation 3  Recall 0  Language- name 2 objects 2  Language- repeat 1  Language- follow 3 step command 3  Language- read & follow direction 1  Write a sentence 1  Copy design 1  Total score 25        Immunization History  Administered Date(s) Administered  . Influenza Split 09/30/2012  . Influenza, High Dose Seasonal PF 08/01/2013, 08/11/2016  . Influenza,inj,Quad PF,36+ Mos 07/26/2015  . Pneumococcal Conjugate-13 03/15/2014  . Pneumococcal Polysaccharide-23 01/30/2013  . Tdap 01/18/1999   Screening Tests Health Maintenance  Topic Date Due  . TETANUS/TDAP   01/16/2018 (Originally 01/17/2009)  . INFLUENZA VACCINE  05/19/2017  . MAMMOGRAM  12/25/2018  . COLONOSCOPY  12/11/2019  . DEXA SCAN  Completed  . Hepatitis C Screening  Completed  . PNA vac Low Risk Adult  Completed      Plan:    Start to eat heart healthy diet (full of fruits, vegetables, whole grains, lean protein, water--limit salt, fat, and sugar intake) and increase physical activity as tolerated.  Start doing brain stimulating activities (puzzles, reading, adult coloring books, staying active) to keep memory sharp.   I have personally reviewed and noted the following in the patient's chart:   . Medical and social history . Use of alcohol, tobacco or illicit drugs  . Current medications and supplements . Functional ability and status . Nutritional status . Physical activity . Advanced directives . List of other physicians . Hospitalizations, surgeries, and ER visits in previous 12 months . Vitals . Screenings to include cognitive, depression, and falls . Referrals and appointments  In addition, I have reviewed and discussed with patient certain preventive protocols, quality metrics, and best practice recommendations. A written personalized care plan for preventive services as well as general preventive health recommendations were provided to patient.     Michiel Cowboy, RN  02/16/2017   Medical screening examination/treatment/procedure(s) were performed by non-physician practitioner and as supervising physician I was immediately available for consultation/collaboration. I agree with above. [name], MD

## 2017-02-18 ENCOUNTER — Telehealth: Payer: Self-pay | Admitting: Internal Medicine

## 2017-02-18 NOTE — Telephone Encounter (Signed)
Pt called stating she would like to discuss getting the Shingrix vaccine when she has her appt 6/20. Her last visit 4/25 she was diagnosed with the shingles. I informed her to get in contact with her insurance company to verify her coverage. She will call back once she knows her coverage.

## 2017-02-18 NOTE — Telephone Encounter (Signed)
Ok thanks for the update 

## 2017-03-09 ENCOUNTER — Encounter: Payer: Self-pay | Admitting: Internal Medicine

## 2017-03-09 ENCOUNTER — Ambulatory Visit (INDEPENDENT_AMBULATORY_CARE_PROVIDER_SITE_OTHER): Payer: Medicare HMO | Admitting: Internal Medicine

## 2017-03-09 VITALS — BP 126/90 | HR 83 | Ht 63.0 in | Wt 209.0 lb

## 2017-03-09 DIAGNOSIS — R350 Frequency of micturition: Secondary | ICD-10-CM

## 2017-03-09 DIAGNOSIS — Z0000 Encounter for general adult medical examination without abnormal findings: Secondary | ICD-10-CM

## 2017-03-09 DIAGNOSIS — K0510 Chronic gingivitis, plaque induced: Secondary | ICD-10-CM | POA: Diagnosis not present

## 2017-03-09 DIAGNOSIS — L749 Eccrine sweat disorder, unspecified: Secondary | ICD-10-CM

## 2017-03-09 LAB — POCT URINALYSIS DIPSTICK
BILIRUBIN UA: NEGATIVE
Blood, UA: NEGATIVE
GLUCOSE UA: NEGATIVE
KETONES UA: NEGATIVE
Nitrite, UA: NEGATIVE
PROTEIN UA: NEGATIVE
Urobilinogen, UA: 0.2 E.U./dL
pH, UA: 6 (ref 5.0–8.0)

## 2017-03-09 MED ORDER — AMOXICILLIN-POT CLAVULANATE 875-125 MG PO TABS: 1.0000 | ORAL_TABLET | Freq: Two times a day (BID) | ORAL | 0 refills | Status: DC

## 2017-03-09 NOTE — Progress Notes (Signed)
Subjective:    Patient ID: [name], female    DOB: 17-Mar-1946, 71 y.o.   MRN: [id number]  HPI  Here with feeling feverish, tendency to sweat a bit with any exertions over the last 2 days, assoc with left upper back molar with pain, swelling and redness.  No chills, sinus pain or congestion, HA, ST or cough.  Pt denies chest pain, increased sob or doe, wheezing, orthopnea, PND, increased LE swelling, palpitations, dizziness or syncope.  Is actually scheduled for tooth removal tomorrow.   Pt denies polydipsia, polyuria but does have incidentally 2 days onset urinary freq and urgency as well. Past Medical History:  Diagnosis Date  . Allergic rhinitis, cause unspecified   . Allergy    SEASONAL  . Anemia   . Chronic headaches   . Diverticulosis   . DJD (degenerative joint disease), lumbar   . GERD (gastroesophageal reflux disease)   . Hemorrhoids   . Hiatal hernia   . HTN (hypertension)   . Hyperlipidemia   . Obesity   . Osteoarthritis   . Tubular adenoma of colon    Past Surgical History:  Procedure Laterality Date  . ABLATION    . COLONOSCOPY    . TUBAL LIGATION      reports that she has never smoked. She has never used smokeless tobacco. She reports that she does not drink alcohol or use drugs. family history includes Diabetes in her brother; Heart disease in her mother; Hypertension in her mother. Allergies  Allergen Reactions  . 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 Sulfonamide Derivatives Itching and Rash   Current Outpatient Prescriptions on File Prior to Visit  Medication Sig Dispense Refill  . atorvastatin (LIPITOR) 20 MG tablet TAKE 1 TABLET EVERY DAY 90 tablet 2  . azelastine (ASTELIN) 0.1 % nasal spray instill 1 to 2 sprays into each nostril at bedtime 30 mL 3  . cetirizine (ZYRTEC) 10 MG tablet Take 1 tablet (10 mg total) by mouth daily. As needed 90 tablet 3  .  Cholecalciferol (VITAMIN D3) 2000 UNITS capsule Take 2,000 Units by mouth daily.    Marland Kitchen diltiazem (TIAZAC) 120 MG 24 hr capsule TAKE 1 CAPSULE EVERY DAY 90 capsule 2  . DULoxetine (CYMBALTA) 20 MG capsule Take 1 capsule (20 mg total) by mouth daily. 30 capsule 3  . esomeprazole (NEXIUM) 40 MG capsule Take 1 capsule (40 mg total) by mouth daily. 30 capsule 11  . ferrous sulfate 325 (65 FE) MG tablet Take 1 tablet (325 mg total) by mouth daily with breakfast. 30 tablet 11  . fluticasone (FLONASE) 50 MCG/ACT nasal spray Place 2 sprays into both nostrils daily. 16 g 0  . gabapentin (NEURONTIN) 300 MG capsule Take 1 capsule (300 mg total) by mouth 3 (three) times daily. 270 capsule 3  . guaiFENesin (MUCINEX) 600 MG 12 hr tablet Take 1 tablet (600 mg total) by mouth 2 (two) times daily as needed for cough or to loosen phlegm. 14 tablet 0  . losartan-hydrochlorothiazide (HYZAAR) 100-25 MG tablet Take 1 tablet by mouth daily. 90 tablet 3  . metoprolol (LOPRESSOR) 50 MG tablet TAKE 1 TABLET TWICE DAILY 180 tablet 0  . oxyCODONE-acetaminophen (PERCOCET) 7.5-325 MG tablet Take 1 tablet by mouth every 6 (six) hours as needed. 35 tablet 0  . ranitidine (ZANTAC) 150 MG tablet Take 1 tablet (150 mg total) by mouth 2 (two) times daily  as needed for heartburn. 60 tablet 3  . sucralfate (CARAFATE) 1 G tablet Take 1 g by mouth as needed. Reported on 12/20/2015    . tiZANidine (ZANAFLEX) 4 MG tablet Take 1 tablet (4 mg total) by mouth every 6 (six) hours as needed for muscle spasms. 40 tablet 0  . triamcinolone (NASACORT AQ) 55 MCG/ACT AERO nasal inhaler Place 2 sprays into the nose daily. 1 Inhaler 3  . Turmeric 500 MG CAPS Take 1 capsule by mouth daily.     No current facility-administered medications on file prior to visit.    Review of Systems  Constitutional: Negative for other unusual diaphoresis or sweats HENT: Negative for ear discharge or swelling Eyes: Negative for other worsening visual  disturbances Respiratory: Negative for stridor or other swelling  Gastrointestinal: Negative for worsening distension or other blood Genitourinary: Negative for retention or other urinary change Musculoskeletal: Negative for other MSK pain or swelling Skin: Negative for color change or other new lesions Neurological: Negative for worsening tremors and other numbness  Psychiatric/Behavioral: Negative for worsening agitation or other fatigue ALl other system neg per pt    Objective:   Physical Exam BP 126/90   Pulse 83   Ht 5\' 3"  (1.6 m)   Wt 209 lb (94.8 kg)   SpO2 98%   BMI 37.02 kg/m  VS noted, mild ill appaering Constitutional: Pt appears in NAD HENT: Head: NCAT. Does have vague swelling to left face and cheek without tender or erythema\Left upper back molar area with gingiva inflamed, red, swelling, tender Right Ear: External ear normal.  Left Ear: External ear normal.  Eyes: . Pupils are equal, round, and reactive to light. Conjunctivae and EOM are normal Nose: without d/c or deformity Neck: Neck supple. Gross normal ROM Cardiovascular: Normal rate and regular rhythm.   Pulmonary/Chest: Effort normal and breath sounds without rales or wheezing.  Neurological: Pt is alert. At baseline orientation, motor grossly intact Skin: Skin is warm. No rashes, other new lesions, no LE edema Psychiatric: Pt behavior is normal without agitation  No other exam findings  POCT Urinalysis Dipstick  Order: [id number]  Status:  Final result Visible to patient:  No (Not Released) Dx:  Preventative health care    Ref Range & Units 17:59 46yr ago   Color, UA      Clarity, UA      Glucose, UA  neg     Bilirubin, UA  neg     Ketones, UA  neg     Spec Grav, UA 1.010 - 1.025 >=1.030      Blood, UA  neg     pH, UA 5.0 - 8.0 6.0     Protein, UA  neg     Urobilinogen, UA 0.2 or 1.0 E.U./dL 0.2  0.2R    Nitrite, UA  neg     Leukocytes, UA Negative Trace   NEGATIVE   Resulting Agency               Assessment & Plan:

## 2017-03-09 NOTE — Patient Instructions (Signed)
Please take all new medication as prescribed - the antibiotic  Please continue all other medications as before, and refills have been done if requested.  Please have the pharmacy call with any other refills you may need.  Please keep your appointments with your specialists as you may have planned  Your urine sample was OK today  We'll see you at your next visit June 20

## 2017-03-10 NOTE — Assessment & Plan Note (Signed)
Most likely related to infectious illness, to cont to monitor for now pending tx

## 2017-03-10 NOTE — Assessment & Plan Note (Signed)
Incidental, UA dip c/w possible UTI, unable to do urine cx at this time due to lab closed, but likely it seems to be covered by antibx for gingivitis as well, cont to follow

## 2017-03-10 NOTE — Assessment & Plan Note (Signed)
Mild to mod, cant r/o early abscess, for antibx course,  to f/u any worsening symptoms or concerns, for tooth removal tomorrow

## 2017-03-18 ENCOUNTER — Telehealth: Payer: Self-pay | Admitting: Internal Medicine

## 2017-03-18 NOTE — Telephone Encounter (Signed)
Patient states her ear is still not feeling much better. She would like to know if something else can be called in for her ear. Please advise, Thank you.

## 2017-03-18 NOTE — Telephone Encounter (Signed)
Not sure how to help with this, except to say to try OTC mucinex to help with any pressure and congestion in the ear, and make sure to take all antibx  If not better, I can refer to ENT

## 2017-03-18 NOTE — Telephone Encounter (Signed)
Pt has been informed and expressed understanding. Will contact us if referral is needed.

## 2017-03-23 ENCOUNTER — Other Ambulatory Visit: Payer: Self-pay | Admitting: Internal Medicine

## 2017-03-31 ENCOUNTER — Ambulatory Visit (INDEPENDENT_AMBULATORY_CARE_PROVIDER_SITE_OTHER): Payer: Medicare HMO | Admitting: Family Medicine

## 2017-03-31 ENCOUNTER — Encounter: Payer: Self-pay | Admitting: Family Medicine

## 2017-03-31 VITALS — BP 116/82 | HR 68 | Ht 63.0 in | Wt 208.0 lb

## 2017-03-31 DIAGNOSIS — M48062 Spinal stenosis, lumbar region with neurogenic claudication: Secondary | ICD-10-CM

## 2017-03-31 DIAGNOSIS — M4726 Other spondylosis with radiculopathy, lumbar region: Secondary | ICD-10-CM | POA: Diagnosis not present

## 2017-03-31 DIAGNOSIS — M170 Bilateral primary osteoarthritis of knee: Secondary | ICD-10-CM

## 2017-03-31 NOTE — Progress Notes (Signed)
[name] [address] [place], [place] [address] Phone: [phone] Subjective:       CC: Bilateral knee pain, back pain  UXN:[id number]  [name] is a 71 y.o. female coming in with complaint of Bilateral knee pain. 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 Patient's last injection was greater than 3 months ago. Having worsening symptoms again. Patient states and starting to affect daily activities and waking her up at night. Describes the pain as dull throbbing aching pain. Some swelling seems to be associated with it.  Patient is also having received AP. 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 Has responded to epidural some the past. Last epidural was in October. Patient states having more radicular symptoms with pain in the buttocks. Possibly some weakness of the leg and intermittently. Numbness of the leg intermittently as well.    Past Medical History:  Diagnosis Date  . Allergic rhinitis, cause unspecified   . Allergy    SEASONAL  . Anemia   . Chronic headaches   . Diverticulosis   . DJD (degenerative joint disease), lumbar   . GERD (gastroesophageal reflux disease)   . Hemorrhoids   . Hiatal hernia   . HTN (hypertension)   . Hyperlipidemia   . Obesity   . Osteoarthritis   . Tubular adenoma of colon    Past Surgical History:  Procedure Laterality Date  . ABLATION    . COLONOSCOPY    . TUBAL LIGATION     Social History   Social History  . Marital status: Widowed    Spouse name: N/A  . Number of children: 3  . Years of education: 12   Occupational History  . retired Unemployed   Social History Main Topics  . Smoking status: Never Smoker  . Smokeless tobacco: Never Used  . Alcohol use No  . Drug use: No  . Sexual activity: No   Other Topics Concern  . None   Social History Narrative  . None   Allergies  Allergen Reactions  . Soybean-Containing  Drug Products Other (See Comments)    Allergy testing positive for soy beans but pt uses soy sauce  . Sulfonamide Derivatives Itching and Rash   Family History  Problem Relation Age of Onset  . Heart disease Mother   . Hypertension Mother   . Diabetes Brother   . Colon cancer Neg Hx   . Esophageal cancer Neg Hx   . Rectal cancer Neg Hx   . Stomach cancer Neg Hx     Past medical history, social, surgical and family history all reviewed in electronic medical record.  No pertanent information unless stated regarding to the chief complaint.   Review of Systems:Review of systems updated and as accurate as of 03/31/17  No headache, visual changes, nausea, vomiting, diarrhea, constipation, dizziness, abdominal pain, skin rash, fevers, chills, night sweats, weight loss, swollen lymph nodes, body aches, joint swelling, muscle aches, chest pain, shortness of breath, mood changes.   Objective  Blood pressure 116/82, pulse 68, height 5\' 3"  (1.6 m), weight 208 lb (94.3 kg), SpO2 97 %. Systems examined below as of 03/31/17   General: No apparent distress alert and oriented x3 mood and affect normal, dressed appropriately.  HEENT: Pupils equal, extraocular movements intact  Respiratory: Patient's speak in full sentences and does not appear short of breath  Cardiovascular: No lower extremity edema, non tender, no erythema  Skin: Warm  dry intact with no signs of infection or rash on extremities or on axial skeleton.  Abdomen: Soft nontender  Neuro: Cranial nerves II through XII are intact, neurovascularly intact in all extremities with 2+ DTRs and 2+ pulses.  Lymph: No lymphadenopathy of posterior or anterior cervical chain or axillae bilaterally.  Gait Antalgic gait MSK:  Non tender with full range of motion and good stability and symmetric strength and tone of shoulders, elbows, wrist, hip and ankles bilaterally.  Knee: Bilateral valgus deformity noted.  Tender to palpation over medial and PF  joint line.  ROM full in flexion and extension and lower leg rotation. instability with valgus force.  painful patellar compression. Patellar glide with moderate crepitus. Patellar and quadriceps tendons unremarkable. Hamstring and quadriceps strength is normal.  Back Exam:  Inspection: Loss of lordosis Motion: Flexion 40 deg, Extension 25 deg, Side Bending to 35 deg bilaterally,  Rotation to 30 deg bilaterally  SLR laying: Moderately positive right side XSLR laying: Negative  Palpable tenderness: Severe tenderness to palpation and appears palmar musculature from L4-S1 bilaterally record of left. FABER: negative. Sensory change: Gross sensation intact to all lumbar and sacral dermatomes.  Reflexes: 2+ at both patellar tendons, 2+ at achilles tendons, Babinski's downgoing.  Strength at foot  4 out of 5 but symmetric  After informed written and verbal consent, patient was seated on exam table. Right knee was prepped with alcohol swab and utilizing anterolateral approach, patient's right knee space was injected with 4:1  marcaine 0.5%: Kenalog 40mg /dL. Patient tolerated the procedure well without immediate complications.  After informed written and verbal consent, patient was seated on exam table. Left knee was prepped with alcohol swab and utilizing anterolateral approach, patient's left knee space was injected with 4:1  marcaine 0.5%: Kenalog 40mg /dL. Patient tolerated the procedure well without immediate complications.   Impression and Recommendations:     This case required medical decision making of moderate complexity.      Note: This dictation was prepared with Dragon dictation along with smaller phrase technology. Any transcriptional errors that result from this process are unintentional.

## 2017-03-31 NOTE — Patient Instructions (Signed)
Good to see you  Ice is your friend Can start orthovisc if knees do not improve.  We will get you set up for another epidural  See me again in 4-6 weeks.

## 2017-03-31 NOTE — Assessment & Plan Note (Signed)
Worsening symptoms of the back. Has responded well to epidurals previously. Patient is going to have a repeat 1. Last one was in October. Follow-up 2-3 weeks after the injection. Continue all the medications.

## 2017-03-31 NOTE — Assessment & Plan Note (Addendum)
Repeat injections given today due to worsening symptoms.. Tolerated the procedure well. We discussed icing regimen and home exercises. Discussed objective is a doing which ones to avoid. Encourage patient to increase activity as tolerated. Could be a candidate for viscous supplementation if pain comes back within 10 weeks.

## 2017-04-07 ENCOUNTER — Other Ambulatory Visit (INDEPENDENT_AMBULATORY_CARE_PROVIDER_SITE_OTHER): Payer: Medicare HMO

## 2017-04-07 ENCOUNTER — Ambulatory Visit (INDEPENDENT_AMBULATORY_CARE_PROVIDER_SITE_OTHER): Payer: Medicare HMO | Admitting: Internal Medicine

## 2017-04-07 ENCOUNTER — Encounter: Payer: Self-pay | Admitting: Internal Medicine

## 2017-04-07 VITALS — BP 124/84 | HR 64 | Ht 63.0 in | Wt 212.0 lb

## 2017-04-07 DIAGNOSIS — Z0000 Encounter for general adult medical examination without abnormal findings: Secondary | ICD-10-CM

## 2017-04-07 LAB — URINALYSIS, ROUTINE W REFLEX MICROSCOPIC
Bilirubin Urine: NEGATIVE
Hgb urine dipstick: NEGATIVE
Ketones, ur: NEGATIVE
Leukocytes, UA: NEGATIVE
NITRITE: NEGATIVE
RBC / HPF: NONE SEEN (ref 0–?)
SPECIFIC GRAVITY, URINE: 1.015 (ref 1.000–1.030)
Total Protein, Urine: NEGATIVE
Urine Glucose: NEGATIVE
Urobilinogen, UA: 0.2 (ref 0.0–1.0)
pH: 6.5 (ref 5.0–8.0)

## 2017-04-07 LAB — HEPATIC FUNCTION PANEL
ALT: 16 U/L (ref 0–35)
AST: 11 U/L (ref 0–37)
Albumin: 4.1 g/dL (ref 3.5–5.2)
Alkaline Phosphatase: 75 U/L (ref 39–117)
BILIRUBIN DIRECT: 0.1 mg/dL (ref 0.0–0.3)
BILIRUBIN TOTAL: 0.7 mg/dL (ref 0.2–1.2)
Total Protein: 6.7 g/dL (ref 6.0–8.3)

## 2017-04-07 LAB — CBC WITH DIFFERENTIAL/PLATELET
BASOS PCT: 1.2 % (ref 0.0–3.0)
Basophils Absolute: 0.1 10*3/uL (ref 0.0–0.1)
EOS ABS: 0.4 10*3/uL (ref 0.0–0.7)
EOS PCT: 3.1 % (ref 0.0–5.0)
HCT: 41.4 % (ref 36.0–46.0)
HEMOGLOBIN: 13.9 g/dL (ref 12.0–15.0)
Lymphocytes Relative: 23.2 % (ref 12.0–46.0)
Lymphs Abs: 2.9 10*3/uL (ref 0.7–4.0)
MCHC: 33.6 g/dL (ref 30.0–36.0)
MCV: 91 fl (ref 78.0–100.0)
Monocytes Absolute: 1 10*3/uL (ref 0.1–1.0)
Monocytes Relative: 8.1 % (ref 3.0–12.0)
NEUTROS ABS: 8.2 10*3/uL — AB (ref 1.4–7.7)
Neutrophils Relative %: 64.4 % (ref 43.0–77.0)
PLATELETS: 319 10*3/uL (ref 150.0–400.0)
RBC: 4.54 Mil/uL (ref 3.87–5.11)
RDW: 14.6 % (ref 11.5–15.5)
WBC: 12.7 10*3/uL — AB (ref 4.0–10.5)

## 2017-04-07 LAB — LIPID PANEL
CHOL/HDL RATIO: 3
Cholesterol: 188 mg/dL (ref 0–200)
HDL: 58.6 mg/dL (ref >39.00)
LDL CALC: 101 mg/dL — AB (ref 0–99)
NONHDL: 128.97
Triglycerides: 138 mg/dL (ref 0.0–149.0)
VLDL: 27.6 mg/dL (ref 0.0–40.0)

## 2017-04-07 LAB — TSH: TSH: 0.82 u[IU]/mL (ref 0.35–4.50)

## 2017-04-07 LAB — BASIC METABOLIC PANEL
BUN: 20 mg/dL (ref 6–23)
CHLORIDE: 101 meq/L (ref 96–112)
CO2: 30 mEq/L (ref 19–32)
Calcium: 9.7 mg/dL (ref 8.4–10.5)
Creatinine, Ser: 0.8 mg/dL (ref 0.40–1.20)
GFR: 91.15 mL/min (ref >60.00)
Glucose, Bld: 99 mg/dL (ref 70–99)
POTASSIUM: 3.6 meq/L (ref 3.5–5.1)
Sodium: 140 mEq/L (ref 135–145)

## 2017-04-07 NOTE — Progress Notes (Signed)
Subjective:    Patient ID: [name], female    DOB: 06-18-1946, 71 y.o.   MRN: [id number]  HPI  Here for wellness and f/u;  Overall doing ok;  Pt denies Chest pain, worsening SOB, DOE, wheezing, orthopnea, PND, worsening LE edema, palpitations, dizziness or syncope.  Pt denies neurological change such as new headache, facial or extremity weakness.  Pt denies polydipsia, polyuria, or low sugar symptoms. Pt states overall good compliance with treatment and medications, good tolerability, and has been trying to follow appropriate diet.  Pt denies worsening depressive symptoms, suicidal ideation or panic. No fever, night sweats, wt loss, loss of appetite, or other constitutional symptoms.  Pt states good ability with ADL's, has low fall risk, home safety reviewed and adequate, no other significant changes in hearing or vision, and not active with exercise.  Wt Readings from Last 3 Encounters:  04/07/17 212 lb (96.2 kg)  03/31/17 208 lb (94.3 kg)  03/09/17 209 lb (94.8 kg)   BP Readings from Last 3 Encounters:  04/07/17 124/84  03/31/17 116/82  03/09/17 126/90  Using the cane when knees hurt more, followed by Dr Tamala Julian.   Past Medical History:  Diagnosis Date  . Allergic rhinitis, cause unspecified   . Allergy    SEASONAL  . Anemia   . Chronic headaches   . Diverticulosis   . DJD (degenerative joint disease), lumbar   . GERD (gastroesophageal reflux disease)   . Hemorrhoids   . Hiatal hernia   . HTN (hypertension)   . Hyperlipidemia   . Obesity   . Osteoarthritis   . Tubular adenoma of colon    Past Surgical History:  Procedure Laterality Date  . ABLATION    . COLONOSCOPY    . TUBAL LIGATION      reports that she has never smoked. She has never used smokeless tobacco. She reports that she does not drink alcohol or use drugs. family history includes Diabetes in her brother; Heart disease in her mother; Hypertension in her mother. Allergies  Allergen Reactions  .  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 Sulfonamide Derivatives Itching and Rash   Current Outpatient Prescriptions on File Prior to Visit  Medication Sig Dispense Refill  . atorvastatin (LIPITOR) 20 MG tablet TAKE 1 TABLET EVERY DAY 90 tablet 2  . azelastine (ASTELIN) 0.1 % nasal spray instill 1 to 2 sprays into each nostril at bedtime 30 mL 3  . cetirizine (ZYRTEC) 10 MG tablet Take 1 tablet (10 mg total) by mouth daily. As needed 90 tablet 3  . Cholecalciferol (VITAMIN D3) 2000 UNITS capsule Take 2,000 Units by mouth daily.    Marland Kitchen diltiazem (TIAZAC) 120 MG 24 hr capsule TAKE 1 CAPSULE EVERY DAY 90 capsule 2  . DULoxetine (CYMBALTA) 20 MG capsule Take 1 capsule (20 mg total) by mouth daily. 30 capsule 3  . esomeprazole (NEXIUM) 40 MG capsule Take 1 capsule (40 mg total) by mouth daily. 30 capsule 11  . ferrous sulfate 325 (65 FE) MG tablet Take 1 tablet (325 mg total) by mouth daily with breakfast. 30 tablet 11  . fluticasone (FLONASE) 50 MCG/ACT nasal spray Place 2 sprays into both nostrils daily. 16 g 0  . gabapentin (NEURONTIN) 300 MG capsule Take 1 capsule (300 mg total) by mouth 3 (three) times daily. 270 capsule 3  . guaiFENesin (MUCINEX) 600 MG 12 hr tablet Take 1 tablet (  600 mg total) by mouth 2 (two) times daily as needed for cough or to loosen phlegm. 14 tablet 0  . losartan-hydrochlorothiazide (HYZAAR) 100-25 MG tablet TAKE 1 TABLET EVERY DAY 90 tablet 3  . metoprolol tartrate (LOPRESSOR) 50 MG tablet TAKE 1 TABLET TWICE DAILY 180 tablet 3  . oxyCODONE-acetaminophen (PERCOCET) 7.5-325 MG tablet Take 1 tablet by mouth every 6 (six) hours as needed. 35 tablet 0  . ranitidine (ZANTAC) 150 MG tablet Take 1 tablet (150 mg total) by mouth 2 (two) times daily as needed for heartburn. 60 tablet 3  . sucralfate (CARAFATE) 1 G tablet Take 1 g by mouth as needed. Reported on 12/20/2015    . tiZANidine (ZANAFLEX) 4 MG  tablet Take 1 tablet (4 mg total) by mouth every 6 (six) hours as needed for muscle spasms. 40 tablet 0  . triamcinolone (NASACORT AQ) 55 MCG/ACT AERO nasal inhaler Place 2 sprays into the nose daily. 1 Inhaler 3  . Turmeric 500 MG CAPS Take 1 capsule by mouth daily.     No current facility-administered medications on file prior to visit.    Review of Systems  Constitutional: Negative for other unusual diaphoresis or sweats HENT: Negative for ear discharge or swelling Eyes: Negative for other worsening visual disturbances Respiratory: Negative for stridor or other swelling  Gastrointestinal: Negative for worsening distension or other blood Genitourinary: Negative for retention or other urinary change Musculoskeletal: Negative for other MSK pain or swelling Skin: Negative for color change or other new lesions Neurological: Negative for worsening tremors and other numbness  Psychiatric/Behavioral: Negative for worsening agitation or other fatigue All other system neg per pt    Objective:   Physical Exam BP 124/84   Pulse 64   Ht 5\' 3"  (1.6 m)   Wt 212 lb (96.2 kg)   SpO2 98%   BMI 37.55 kg/m  VS noted, obese Constitutional: Pt is oriented to person, place, and time. Appears well-developed and well-nourished, in no significant distress and comfortable Head: Normocephalic and atraumatic  Eyes: Conjunctivae and EOM are normal. Pupils are equal, round, and reactive to light Right Ear: External ear normal without discharge Left Ear: External ear normal without discharge Nose: Nose without discharge or deformity Mouth/Throat: Oropharynx is without other ulcerations and moist  Neck: Normal range of motion. Neck supple. No JVD present. No tracheal deviation present or significant neck LA or mass Cardiovascular: Normal rate, regular rhythm, normal heart sounds and intact distal pulses.   Pulmonary/Chest: WOB normal and breath sounds without rales or wheezing  Abdominal: Soft. Bowel sounds  are normal. NT. No HSM  Musculoskeletal: Normal range of motion. Exhibits no edema Lymphadenopathy: Has no other cervical adenopathy.  Neurological: Pt is alert and oriented to person, place, and time. Pt has normal reflexes. No cranial nerve deficit. Motor grossly intact, Gait intact Skin: Skin is warm and dry. No rash noted or new ulcerations Psychiatric:  Has normal mood and affect. Behavior is normal without agitation No other exam findings  Lab Results  Component Value Date   WBC 10.5 07/21/2016   HGB 13.9 07/21/2016   HCT 40.8 07/21/2016   PLT 381.0 07/21/2016   GLUCOSE 91 09/18/2014   CHOL 172 09/18/2014   TRIG 115.0 09/18/2014   HDL 53.30 09/18/2014   LDLDIRECT 168.8 01/20/2012   LDLCALC 96 09/18/2014   ALT 21 09/18/2014   AST 21 09/18/2014   NA 138 09/18/2014   K 4.2 09/18/2014   CL 102 09/18/2014   CREATININE 0.9  09/18/2014   BUN 25 (H) 09/18/2014   CO2 29 09/18/2014   TSH 0.70 09/18/2014       Assessment & Plan:

## 2017-04-07 NOTE — Patient Instructions (Addendum)
You had the Tdap tetanus shot today  Please continue all other medications as before, and refills have been done if requested.  Please have the pharmacy call with any other refills you may need.  Please continue your efforts at being more active, low cholesterol diet, and weight control.  You are otherwise up to date with prevention measures today.  Please keep your appointments with your specialists as you may have planned  Please go to the LAB in the Basement (turn left off the elevator) for the tests to be done today  You will be contacted by phone if any changes need to be made immediately.  Otherwise, you will receive a letter about your results with an explanation, but please check with MyChart first.  Please remember to sign up for MyChart if you have not done so, as this will be important to you in the future with finding out test results, communicating by private email, and scheduling acute appointments online when needed.  If you have Medicare related insurance (such as traditional Medicare, Blue [employer] or [employer], or similar), Please make an appointment at the [facility] with Sharee Pimple, the [employer], for your Wellness Visit in this office, which is a benefit with your insurance.  Please return in 1 year for your yearly visit, or sooner if needed, with Lab testing done 3-5 days before

## 2017-04-07 NOTE — Assessment & Plan Note (Signed)

## 2017-04-12 ENCOUNTER — Other Ambulatory Visit: Payer: Medicare HMO

## 2017-04-23 ENCOUNTER — Ambulatory Visit
Admission: RE | Admit: 2017-04-23 | Discharge: 2017-04-23 | Disposition: A | Discharge status: Home / Self Care | Payer: Medicare HMO | Source: Ambulatory Visit | Attending: Family Medicine | Admitting: Family Medicine

## 2017-04-23 DIAGNOSIS — M545 Low back pain: Secondary | ICD-10-CM | POA: Diagnosis not present

## 2017-04-23 DIAGNOSIS — M48062 Spinal stenosis, lumbar region with neurogenic claudication: Secondary | ICD-10-CM

## 2017-04-23 MED ORDER — METHYLPREDNISOLONE ACETATE 40 MG/ML INJ SUSP (RADIOLOG
120.0000 mg | Freq: Once | INTRAMUSCULAR | Status: AC
  Administered 2017-04-23: 120 mg via EPIDURAL

## 2017-04-23 MED ORDER — IOPAMIDOL (ISOVUE-M 200) INJECTION 41%
1.0000 mL | Freq: Once | INTRAMUSCULAR | Status: AC
  Administered 2017-04-23: 1 mL via EPIDURAL

## 2017-04-23 NOTE — Discharge Instructions (Signed)

## 2017-05-12 ENCOUNTER — Encounter: Payer: Self-pay | Admitting: Family Medicine

## 2017-05-12 ENCOUNTER — Ambulatory Visit (INDEPENDENT_AMBULATORY_CARE_PROVIDER_SITE_OTHER): Payer: Medicare HMO | Admitting: Family Medicine

## 2017-05-12 DIAGNOSIS — M170 Bilateral primary osteoarthritis of knee: Secondary | ICD-10-CM

## 2017-05-12 DIAGNOSIS — M48062 Spinal stenosis, lumbar region with neurogenic claudication: Secondary | ICD-10-CM | POA: Diagnosis not present

## 2017-05-12 NOTE — Patient Instructions (Signed)
Good to see you  [name] is your friend.  Keep it up  I would see you again in 6 weeks and maybe we do the other injections in the knees  For the back call me if it gets worse and we can repeat the injections Continue the gabapentin and can do 2 pills at night if really needed Zanaflex up to 2 times a day as needed See me again In 6 weeks!

## 2017-05-12 NOTE — Assessment & Plan Note (Signed)
Stable. We'll continue to monitor. Has responded fairly well to epidural some the past. Had some mild improvement but not severe improvement with the last epidural. We will continue to monitor and can consider never injection if continuing have pain.

## 2017-05-12 NOTE — Assessment & Plan Note (Signed)
Stable. Continue with conservative therapy. If within 12 weeks we'll consider viscous supplementation.

## 2017-05-12 NOTE — Progress Notes (Signed)
[name] [address] [place], [place] [address] Phone: [phone] Subjective:       CC: Bilateral knee pain, back pain f/u  QQI:[id number]  [name] is a 71 y.o. female coming in with complaint of Bilateral knee pain. 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 Patient was seen one month ago and was given bilateral steroid injections in the knees. Patient states doing better overall. Less swelling. Feels like she is making some progress. Still not completely pain-free. Still able with the walker to cane.  Patient is also having received AP. 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 Has responded to epidural some the past. Last epidural was July. Doing better overall. Patient feels like she is not having as much back pain but still has some of the radiation the pain. Denies any new symptoms    Past Medical History:  Diagnosis Date  . Allergic rhinitis, cause unspecified   . Allergy    SEASONAL  . Anemia   . Chronic headaches   . Diverticulosis   . DJD (degenerative joint disease), lumbar   . GERD (gastroesophageal reflux disease)   . Hemorrhoids   . Hiatal hernia   . HTN (hypertension)   . Hyperlipidemia   . Obesity   . Osteoarthritis   . Tubular adenoma of colon    Past Surgical History:  Procedure Laterality Date  . ABLATION    . COLONOSCOPY    . TUBAL LIGATION     Social History   Social History  . Marital status: Widowed    Spouse name: N/A  . Number of children: 3  . Years of education: 12   Occupational History  . retired Unemployed   Social History Main Topics  . Smoking status: Never Smoker  . Smokeless tobacco: Never Used  . Alcohol use No  . Drug use: No  . Sexual activity: No   Other Topics Concern  . None   Social History Narrative  . None   Allergies  Allergen Reactions  . Soybean-Containing Drug Products Other (See Comments)    Allergy  testing positive for soy beans but pt uses soy sauce  . Sulfonamide Derivatives Itching and Rash   Family History  Problem Relation Age of Onset  . Heart disease Mother   . Hypertension Mother   . Diabetes Brother   . Colon cancer Neg Hx   . Esophageal cancer Neg Hx   . Rectal cancer Neg Hx   . Stomach cancer Neg Hx     Past medical history, social, surgical and family history all reviewed in electronic medical record.  No pertanent information unless stated regarding to the chief complaint.   Review of Systems: No headache, visual changes, nausea, vomiting, diarrhea, constipation, dizziness, abdominal pain, skin rash, fevers, chills, night sweats, weight loss, swollen lymph nodes, body aches, joint swelling,  chest pain, shortness of breath, mood changes.  Positive muscle aches  Objective  Blood pressure 122/84, pulse 66, height 5\' 3"  (1.6 m), weight 211 lb (95.7 kg), SpO2 96 %.   Systems examined below as of 05/12/17 General: NAD A&O x3 mood, affect normal  HEENT: Pupils equal, extraocular movements intact no nystagmus Respiratory: not short of breath at rest or with speaking Cardiovascular: No lower extremity edema, non tender Skin: Warm dry intact with no signs of infection or rash on extremities or on axial skeleton. Abdomen: Soft nontender, no masses obese  Neuro:  Cranial nerves  intact, neurovascularly intact in all extremities with 2+ DTRs and 2+ pulses. Lymph: No lymphadenopathy appreciated today  Gait Antalgic gait MSK: Non tender with full range of motion and good stability and symmetric strength and tone of shoulders, elbows, wrist,  hips and ankles bilaterally.   Knee: Bilateral valgus deformity noted. Large thigh to calf ratio.  Less tenderness than improve his exam ROM full in flexion and extension and lower leg rotation. instability with valgus force.  painful patellar compression. Patellar glide with moderate crepitus. Patellar and quadriceps tendons  unremarkable. Hamstring and quadriceps strength is normal.  Back Exam:  Inspection: Loss of lordosis Motion: Flexion 35 deg, Extension 25 deg, Side Bending to 35 deg bilaterally,  Rotation to 30 deg bilaterally  SLR laying: Patient had more range of motion but then 40 it have a positive radicular signs XSLR laying: Negative  Palpable tenderness: Severe tenderness to palpation and appears palmar musculature from L4-S1 bilaterally record of left. FABER: negative. Sensory change: Gross sensation intact to all lumbar and sacral dermatomes.  Reflexes: 2+ at both patellar tendons, 2+ at achilles tendons, Babinski's downgoing.  Strength at foot  4 out of 5 but symmetric   Impression and Recommendations:     This case required medical decision making of moderate complexity.      Note: This dictation was prepared with Dragon dictation along with smaller phrase technology. Any transcriptional errors that result from this process are unintentional.

## 2017-05-17 ENCOUNTER — Other Ambulatory Visit: Payer: Self-pay | Admitting: Family Medicine

## 2017-05-17 NOTE — Telephone Encounter (Signed)
Refill done.  

## 2017-05-28 ENCOUNTER — Telehealth: Payer: Self-pay | Admitting: Family Medicine

## 2017-05-28 DIAGNOSIS — M48062 Spinal stenosis, lumbar region with neurogenic claudication: Secondary | ICD-10-CM

## 2017-05-28 NOTE — Telephone Encounter (Signed)
Epidural order sent to [facility]. They will contact pt to schedule appt.  Pt made aware.

## 2017-05-28 NOTE — Telephone Encounter (Signed)
Pt called stating that she is have pain in her back that is going down to her legs. This is a continued issue. She asked if Dr Tamala Julian could put orders in for her to have another epidural. Please advise.

## 2017-06-01 ENCOUNTER — Telehealth: Payer: Self-pay | Admitting: Internal Medicine

## 2017-06-01 MED ORDER — AMOXICILLIN 500 MG PO TABS: 500.0000 mg | ORAL_TABLET | Freq: Two times a day (BID) | ORAL | 0 refills | Status: DC

## 2017-06-01 NOTE — Telephone Encounter (Signed)
Offer amoxacillin 500 mg, # 14, 1 twice daily  Suggest otc Sudafed from pharmacy as a decongestant

## 2017-06-01 NOTE — Telephone Encounter (Signed)
Called and spoke pt. Pt c/o sinus congestion, headache, PND, cough with little mucus production - white in color x 7 days. Pt denies SOB, CP/tightness, and f/c/s. Pt states she has been taking Zyrtec without relief. Pt was last seen in 10/2016 and advised to f/u in one year.   Dr. Annamaria Boots please advise. Thanks.   Allergies  Allergen Reactions  . 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 Sulfonamide Derivatives Itching and Rash    Current Outpatient Prescriptions on File Prior to Visit  Medication Sig Dispense Refill  . atorvastatin (LIPITOR) 20 MG tablet TAKE 1 TABLET EVERY DAY 90 tablet 2  . azelastine (ASTELIN) 0.1 % nasal spray instill 1 to 2 sprays into each nostril at bedtime 30 mL 3  . cetirizine (ZYRTEC) 10 MG tablet Take 1 tablet (10 mg total) by mouth daily. As needed 90 tablet 3  . Cholecalciferol (VITAMIN D3) 2000 UNITS capsule Take 2,000 Units by mouth daily.    Marland Kitchen diltiazem (TIAZAC) 120 MG 24 hr capsule TAKE 1 CAPSULE EVERY DAY 90 capsule 2  . DULoxetine (CYMBALTA) 20 MG capsule take 1 capsule by mouth once daily 90 capsule 1  . esomeprazole (NEXIUM) 40 MG capsule Take 1 capsule (40 mg total) by mouth daily. 30 capsule 11  . ferrous sulfate 325 (65 FE) MG tablet Take 1 tablet (325 mg total) by mouth daily with breakfast. 30 tablet 11  . fluticasone (FLONASE) 50 MCG/ACT nasal spray Place 2 sprays into both nostrils daily. 16 g 0  . gabapentin (NEURONTIN) 300 MG capsule Take 1 capsule (300 mg total) by mouth 3 (three) times daily. 270 capsule 3  . guaiFENesin (MUCINEX) 600 MG 12 hr tablet Take 1 tablet (600 mg total) by mouth 2 (two) times daily as needed for cough or to loosen phlegm. 14 tablet 0  . losartan-hydrochlorothiazide (HYZAAR) 100-25 MG tablet TAKE 1 TABLET EVERY DAY 90 tablet 3  . metoprolol tartrate (LOPRESSOR) 50 MG tablet TAKE 1 TABLET TWICE DAILY 180 tablet 3  . oxyCODONE-acetaminophen  (PERCOCET) 7.5-325 MG tablet Take 1 tablet by mouth every 6 (six) hours as needed. 35 tablet 0  . ranitidine (ZANTAC) 150 MG tablet Take 1 tablet (150 mg total) by mouth 2 (two) times daily as needed for heartburn. 60 tablet 3  . sucralfate (CARAFATE) 1 G tablet Take 1 g by mouth as needed. Reported on 12/20/2015    . tiZANidine (ZANAFLEX) 4 MG tablet Take 1 tablet (4 mg total) by mouth every 6 (six) hours as needed for muscle spasms. 40 tablet 0  . triamcinolone (NASACORT AQ) 55 MCG/ACT AERO nasal inhaler Place 2 sprays into the nose daily. 1 Inhaler 3  . Turmeric 500 MG CAPS Take 1 capsule by mouth daily.     No current facility-administered medications on file prior to visit.

## 2017-06-01 NOTE — Telephone Encounter (Signed)
Called and spoke to pt. Informed her of the recs per CY. Rx sent to preferred pharmacy. Pt verbalized understanding and denied any further questions or concerns at this time.   

## 2017-06-11 ENCOUNTER — Ambulatory Visit
Admission: RE | Admit: 2017-06-11 | Discharge: 2017-06-11 | Disposition: A | Discharge status: Home / Self Care | Payer: Medicare HMO | Source: Ambulatory Visit | Attending: Family Medicine | Admitting: Family Medicine

## 2017-06-11 DIAGNOSIS — M48062 Spinal stenosis, lumbar region with neurogenic claudication: Secondary | ICD-10-CM

## 2017-06-11 DIAGNOSIS — M47817 Spondylosis without myelopathy or radiculopathy, lumbosacral region: Secondary | ICD-10-CM | POA: Diagnosis not present

## 2017-06-11 MED ORDER — IOPAMIDOL (ISOVUE-M 200) INJECTION 41%
1.0000 mL | Freq: Once | INTRAMUSCULAR | Status: AC
  Administered 2017-06-11: 1 mL via EPIDURAL

## 2017-06-11 MED ORDER — METHYLPREDNISOLONE ACETATE 40 MG/ML INJ SUSP (RADIOLOG
120.0000 mg | Freq: Once | INTRAMUSCULAR | Status: AC
  Administered 2017-06-11: 120 mg via EPIDURAL

## 2017-06-11 NOTE — Discharge Instructions (Signed)

## 2017-06-16 ENCOUNTER — Telehealth: Payer: Self-pay | Admitting: Internal Medicine

## 2017-06-16 DIAGNOSIS — M48062 Spinal stenosis, lumbar region with neurogenic claudication: Secondary | ICD-10-CM

## 2017-06-16 NOTE — Telephone Encounter (Signed)
Pt called in and said that she is hurting in her lower back ever since the eparterial.  She would like to know if you would give her a call back   Best number [phone]

## 2017-06-16 NOTE — Telephone Encounter (Signed)
Spoke to pt, she requested another epidural. Order entered & sent to [facility].

## 2017-06-23 ENCOUNTER — Ambulatory Visit: Payer: Self-pay

## 2017-06-23 ENCOUNTER — Ambulatory Visit (INDEPENDENT_AMBULATORY_CARE_PROVIDER_SITE_OTHER): Payer: Medicare HMO | Admitting: Family Medicine

## 2017-06-23 ENCOUNTER — Encounter: Payer: Self-pay | Admitting: Family Medicine

## 2017-06-23 VITALS — BP 140/92 | HR 66 | Ht 62.0 in | Wt 212.0 lb

## 2017-06-23 DIAGNOSIS — M7061 Trochanteric bursitis, right hip: Secondary | ICD-10-CM

## 2017-06-23 DIAGNOSIS — M25551 Pain in right hip: Secondary | ICD-10-CM

## 2017-06-23 MED ORDER — DULOXETINE HCL 30 MG PO CPEP: 30.0000 mg | ORAL_CAPSULE | Freq: Every day | ORAL | 3 refills | Status: DC

## 2017-06-23 NOTE — Patient Instructions (Signed)
Good to see you  Injected the side of the hip and I hope it helps.  If not we will need to try one mor einjection in the back.  If that dose not do it then we may need to consider neurosurgery  Send me a message on Friday and tell me how you are doing and if you want the injection in the back

## 2017-06-23 NOTE — Assessment & Plan Note (Signed)
Patient given injection for more of a greater trochanter bursitis. There is still a chance of this is more of a lumbar radiculopathy. Concern him 3 within a 12 month. The patient is adamant that she feels that the fourth epidural or nerve root injection could be beneficial. If patient makes no significant improvement we will consider doing this. Otherwise patient will also need to be referred to neurosurgery for further evaluation and treatment discussed surgical intervention. Patient is in agreement with the plan. Increase patient's Cymbalta 30 mg.

## 2017-06-23 NOTE — Progress Notes (Signed)
[name] [address] [place], [place] [address] Phone: [phone] Subjective:    I'm seeing this patient by the request  of:    CC: Right leg and hip pain  [id number]  [name] is a 71 y.o. female coming in with complaint of lower back pain for 2 weeks. She is having pain all the time on the right side with pain that radiates down her right leg. We seen patient previously for quite some time. Patient has had more of a lumbar radiculopathy and MRI does have corresponded with an L5 nerve root impingement. Patient was just given an injection in the lower 2 weeks ago for the epidural. Patient has now had 3 and a 12 month period having worsening pain at night. Affecting daily activities. Feels like the leg is more weakness than usual.  Onset-2 weeks Location- right hip/lumbar spine Character-shooting Aggravating factors-weight bearing Reliving factors- stopping activity Therapies tried- Tylenol Severity-8 out of 10     Past Medical History:  Diagnosis Date  . Allergic rhinitis, cause unspecified   . Allergy    SEASONAL  . Anemia   . Chronic headaches   . Diverticulosis   . DJD (degenerative joint disease), lumbar   . GERD (gastroesophageal reflux disease)   . Hemorrhoids   . Hiatal hernia   . HTN (hypertension)   . Hyperlipidemia   . Obesity   . Osteoarthritis   . Tubular adenoma of colon    Past Surgical History:  Procedure Laterality Date  . ABLATION    . COLONOSCOPY    . TUBAL LIGATION     Social History   Social History  . Marital status: Widowed    Spouse name: N/A  . Number of children: 3  . Years of education: 12   Occupational History  . retired Unemployed   Social History Main Topics  . Smoking status: Never Smoker  . Smokeless tobacco: Never Used  . Alcohol use No  . Drug use: No  . Sexual activity: No   Other Topics Concern  . None   Social History Narrative  . None   Allergies  Allergen  Reactions  . 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 Sulfonamide Derivatives Itching and Rash   Family History  Problem Relation Age of Onset  . Heart disease Mother   . Hypertension Mother   . Diabetes Brother   . Colon cancer Neg Hx   . Esophageal cancer Neg Hx   . Rectal cancer Neg Hx   . Stomach cancer Neg Hx      Past medical history, social, surgical and family history all reviewed in electronic medical record.  No pertanent information unless stated regarding to the chief complaint.   Review of Systems:Review of systems updated and as accurate as of 06/23/17  No headache, visual changes, nausea, vomiting, diarrhea, constipation, dizziness, abdominal pain, skin rash, fevers, chills, night sweats, weight loss, swollen lymph nodes, body aches, joint swelling, , chest pain, shortness of breath, mood changes. positive muscle aches  Objective  Blood pressure (!) 140/92, pulse 66, height 5\' 2"  (1.575 m), weight 212 lb (96.2 kg). Systems examined below as of 06/23/17   General: No apparent distress alert and oriented x3 mood and affect normal, dressed appropriately.  HEENT: Pupils equal, extraocular movements intact  Respiratory: Patient's speak in full sentences and does not appear short of breath  Cardiovascular: No lower extremity edema, non tender, no erythema  Skin: Warm dry intact with no signs of infection or rash on extremities or on axial skeleton.  Abdomen: Soft nontender  Neuro: Cranial nerves II through XII are intact, neurovascularly intact in all extremities with 2+ DTRs and 2+ pulses.  Lymph: No lymphadenopathy of posterior or anterior cervical chain or axillae bilaterally.  Gait Antalgic gait MSK:  Non tender with full range of motion and good stability and symmetric strength and tone of shoulders, elbows, wrist, hip, knee and ankles bilaterally. Arthritic changes of multiple joints Hip  exam shows the patient is more severe pain over the lateral aspect of the hip. Seems to be more in the greater trochanteric. Patient does have mild positive straight leg test but not as severe as the span the past. Strength is 4 out of 5 on the right lower extremity compared to contralateral sign. Positive pain with Corky Sox test.   Procedure: Real-time Ultrasound Guided Injection of right greater trochanteric bursitis secondary to patient's body habitus Device: GE Logiq Q7 Ultrasound guided injection is preferred based studies that show increased duration, increased effect, greater accuracy, decreased procedural pain, increased response rate, and decreased cost with ultrasound guided versus blind injection.  Verbal informed consent obtained.  Time-out conducted.  Noted no overlying erythema, induration, or other signs of local infection.  Skin prepped in a sterile fashion.  Local anesthesia: Topical Ethyl chloride.  With sterile technique and under real time ultrasound guidance:  Greater trochanteric area was visualized and patient's bursa was noted. A 22-gauge 3 inch needle was inserted and 4 cc of 0.5% Marcaine and 1 cc of Kenalog 40 mg/dL was injected. Pictures taken Completed without difficulty  Pain immediately resolved suggesting accurate placement of the medication.  Advised to call if fevers/chills, erythema, induration, drainage, or persistent bleeding.  Images permanently stored and available for review in the ultrasound unit.  Impression: Technically successful ultrasound guided injection.    Impression and Recommendations:     This case required medical decision making of moderate complexity.      Note: This dictation was prepared with Dragon dictation along with smaller phrase technology. Any transcriptional errors that result from this process are unintentional.

## 2017-06-25 ENCOUNTER — Telehealth: Payer: Self-pay | Admitting: Family Medicine

## 2017-06-25 NOTE — Telephone Encounter (Signed)
Patient states she was informed to call back to let Dr. Tamala Julian know how she was doing. She states she is doing some what better. Still having pain in the am.

## 2017-07-05 ENCOUNTER — Other Ambulatory Visit: Payer: Self-pay | Admitting: Internal Medicine

## 2017-07-22 ENCOUNTER — Ambulatory Visit
Admission: RE | Admit: 2017-07-22 | Discharge: 2017-07-22 | Disposition: A | Discharge status: Home / Self Care | Payer: Medicare HMO | Source: Ambulatory Visit | Attending: Family Medicine | Admitting: Family Medicine

## 2017-07-22 DIAGNOSIS — M48062 Spinal stenosis, lumbar region with neurogenic claudication: Secondary | ICD-10-CM | POA: Diagnosis not present

## 2017-07-22 MED ORDER — METHYLPREDNISOLONE ACETATE 40 MG/ML INJ SUSP (RADIOLOG
120.0000 mg | Freq: Once | INTRAMUSCULAR | Status: AC
  Administered 2017-07-22: 120 mg via EPIDURAL

## 2017-07-22 MED ORDER — IOPAMIDOL (ISOVUE-M 200) INJECTION 41%
1.0000 mL | Freq: Once | INTRAMUSCULAR | Status: AC
  Administered 2017-07-22: 1 mL via EPIDURAL

## 2017-07-22 NOTE — Discharge Instructions (Signed)

## 2017-07-26 ENCOUNTER — Other Ambulatory Visit (INDEPENDENT_AMBULATORY_CARE_PROVIDER_SITE_OTHER): Payer: Medicare HMO

## 2017-07-26 ENCOUNTER — Ambulatory Visit (INDEPENDENT_AMBULATORY_CARE_PROVIDER_SITE_OTHER): Payer: Medicare HMO | Admitting: Internal Medicine

## 2017-07-26 ENCOUNTER — Ambulatory Visit (INDEPENDENT_AMBULATORY_CARE_PROVIDER_SITE_OTHER)
Admission: RE | Admit: 2017-07-26 | Discharge: 2017-07-26 | Disposition: A | Discharge status: Home / Self Care | Payer: Medicare HMO | Source: Ambulatory Visit | Attending: Internal Medicine | Admitting: Internal Medicine

## 2017-07-26 ENCOUNTER — Encounter: Payer: Self-pay | Admitting: Internal Medicine

## 2017-07-26 VITALS — BP 132/86 | HR 96 | Temp 98.3°F | Ht 62.0 in | Wt 217.0 lb

## 2017-07-26 DIAGNOSIS — I10 Essential (primary) hypertension: Secondary | ICD-10-CM | POA: Diagnosis not present

## 2017-07-26 DIAGNOSIS — Z0000 Encounter for general adult medical examination without abnormal findings: Secondary | ICD-10-CM | POA: Diagnosis not present

## 2017-07-26 DIAGNOSIS — Z23 Encounter for immunization: Secondary | ICD-10-CM

## 2017-07-26 DIAGNOSIS — R61 Generalized hyperhidrosis: Secondary | ICD-10-CM | POA: Insufficient documentation

## 2017-07-26 DIAGNOSIS — R9389 Abnormal findings on diagnostic imaging of other specified body structures: Secondary | ICD-10-CM

## 2017-07-26 LAB — URINALYSIS, ROUTINE W REFLEX MICROSCOPIC
Bilirubin Urine: NEGATIVE
Hgb urine dipstick: NEGATIVE
KETONES UR: NEGATIVE
Leukocytes, UA: NEGATIVE
Nitrite: NEGATIVE
PH: 6 (ref 5.0–8.0)
RBC / HPF: NONE SEEN (ref 0–?)
Total Protein, Urine: NEGATIVE
URINE GLUCOSE: NEGATIVE
UROBILINOGEN UA: 0.2 (ref 0.0–1.0)
WBC UA: NONE SEEN (ref 0–?)

## 2017-07-26 NOTE — Assessment & Plan Note (Signed)
Of ? Significance, exam benign, but given sweats will recheck cxr

## 2017-07-26 NOTE — Patient Instructions (Signed)
You had the flu shot today  Please continue all other medications as before, and refills have been done if requested.  Please have the pharmacy call with any other refills you may need.  Please keep your appointments with your specialists as you may have planned  Please go to the XRAY Department in the Basement (go straight as you get off the elevator) for the x-ray testing  Please go to the LAB in the Basement (turn left off the elevator) for the tests to be done today - just the urine testing today  You will be contacted by phone if any changes need to be made immediately.  Otherwise, you will receive a letter about your results with an explanation, but please check with MyChart first.  Please remember to sign up for MyChart if you have not done so, as this will be important to you in the future with finding out test results, communicating by private email, and scheduling acute appointments online when needed.

## 2017-07-26 NOTE — Assessment & Plan Note (Signed)
stable overall by history and exam, recent data reviewed with pt, and pt to continue medical treatment as before,  to f/u any worsening symptoms or concerns BP Readings from Last 3 Encounters:  07/26/17 132/86  07/22/17 127/78  06/23/17 (!) 140/92

## 2017-07-26 NOTE — Assessment & Plan Note (Signed)
No evidence for significant rheum, oncologic, or infectious symptoms to cause this.  Suspect may be related to stressors.  Also for UA today

## 2017-07-26 NOTE — Progress Notes (Signed)
Subjective:    Patient ID: [name], female    DOB: 04/14/1946, 71 y.o.   MRN: [id number]  HPI  Here with c/o Sweats at night with each morning wetness to the head and neck x 2 mo, despite black cohosh.   Pt denies fever, wt loss, loss of appetite, or other constitutional symptoms  Has ongoing stressors at home.  Denies worsening depressive symptoms, suicidal ideation, or panic Pt denies chest pain, increased sob or doe, wheezing, orthopnea, PND, increased LE swelling, palpitations, dizziness or syncope.  Pt denies new neurological symptoms such as new headache, or facial or extremity weakness or numbness   Pt denies polydipsia, polyuria  For flu shot today Wt Readings from Last 3 Encounters:  07/26/17 217 lb (98.4 kg)  06/23/17 212 lb (96.2 kg)  05/12/17 211 lb (95.7 kg)   Past Medical History:  Diagnosis Date  . Allergic rhinitis, cause unspecified   . Allergy    SEASONAL  . Anemia   . Chronic headaches   . Diverticulosis   . DJD (degenerative joint disease), lumbar   . GERD (gastroesophageal reflux disease)   . Hemorrhoids   . Hiatal hernia   . HTN (hypertension)   . Hyperlipidemia   . Obesity   . Osteoarthritis   . Tubular adenoma of colon    Past Surgical History:  Procedure Laterality Date  . ABLATION    . COLONOSCOPY    . TUBAL LIGATION      reports that she has never smoked. She has never used smokeless tobacco. She reports that she does not drink alcohol or use drugs. family history includes Diabetes in her brother; Heart disease in her mother; Hypertension in her mother. Allergies  Allergen Reactions  . 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 Sulfonamide Derivatives Itching and Rash   Review of Systems   Constitutional: Negative for other unusual diaphoresis or sweats HENT: Negative for ear discharge or swelling Eyes: Negative for other worsening visual  disturbances Respiratory: Negative for stridor or other swelling  Gastrointestinal: Negative for worsening distension or other blood Genitourinary: Negative for retention or other urinary change Musculoskeletal: Negative for other MSK pain or swelling Skin: Negative for color change or other new lesions Neurological: Negative for worsening tremors and other numbness  Psychiatric/Behavioral: Negative for worsening agitation or other fatigue All otherwise neg per pt    Objective:   Physical Exam BP 132/86   Pulse 96   Temp 98.3 F (36.8 C) (Oral)   Ht 5\' 2"  (1.575 m)   Wt 217 lb (98.4 kg)   SpO2 99%   BMI 39.69 kg/m  VS noted, not ill appearing Constitutional: Pt appears in NAD HENT: Head: NCAT.  Right Ear: External ear normal.  Left Ear: External ear normal.  Eyes: . Pupils are equal, round, and reactive to light. Conjunctivae and EOM are normal Nose: without d/c or deformity Neck: Neck supple. Gross normal ROM Cardiovascular: Normal rate and regular rhythm.   Pulmonary/Chest: Effort normal and breath sounds without rales or wheezing.  Abd:  Soft, NT, ND, + BS, no organomegaly Neurological: Pt is alert. At baseline orientation, motor grossly intact Skin: Skin is warm. No rashes, other new lesions, no LE edema Psychiatric: Pt behavior is normal without agitation  No other exam findings\  Lab Results  Component Value Date   WBC 12.7 (H) 04/07/2017   HGB 13.9 04/07/2017  HCT 41.4 04/07/2017   PLT 319.0 04/07/2017   GLUCOSE 99 04/07/2017   CHOL 188 04/07/2017   TRIG 138.0 04/07/2017   HDL 58.60 04/07/2017   LDLDIRECT 168.8 01/20/2012   LDLCALC 101 (H) 04/07/2017   ALT 16 04/07/2017   AST 11 04/07/2017   NA 140 04/07/2017   K 3.6 04/07/2017   CL 101 04/07/2017   CREATININE 0.80 04/07/2017   BUN 20 04/07/2017   CO2 30 04/07/2017   TSH 0.82 04/07/2017   11/16/2016 CHEST  2 VIEW IMPRESSION: Right hilar prominence appears slightly more notable than on the 2009  exam. This may indicate presence of pulmonary hypertension. No definitive mass seen separate from right pulmonary artery although evaluation limited. CT imaging can be obtained for further delineation Calcified tortuous aorta. No infiltrate or congestive heart failure.     Assessment & Plan:

## 2017-08-16 ENCOUNTER — Ambulatory Visit: Payer: Self-pay

## 2017-08-16 ENCOUNTER — Other Ambulatory Visit: Payer: Self-pay | Admitting: Family Medicine

## 2017-08-16 ENCOUNTER — Ambulatory Visit (INDEPENDENT_AMBULATORY_CARE_PROVIDER_SITE_OTHER): Payer: Medicare HMO | Admitting: Family Medicine

## 2017-08-16 ENCOUNTER — Encounter: Payer: Self-pay | Admitting: Family Medicine

## 2017-08-16 VITALS — BP 104/80 | HR 70 | Ht 63.0 in | Wt 219.0 lb

## 2017-08-16 DIAGNOSIS — M170 Bilateral primary osteoarthritis of knee: Secondary | ICD-10-CM

## 2017-08-16 DIAGNOSIS — M25511 Pain in right shoulder: Secondary | ICD-10-CM | POA: Diagnosis not present

## 2017-08-16 DIAGNOSIS — M48062 Spinal stenosis, lumbar region with neurogenic claudication: Secondary | ICD-10-CM

## 2017-08-16 DIAGNOSIS — M7551 Bursitis of right shoulder: Secondary | ICD-10-CM | POA: Diagnosis not present

## 2017-08-16 MED ORDER — DULOXETINE HCL 40 MG PO CPEP: 40.0000 mg | ORAL_CAPSULE | Freq: Every day | ORAL | 1 refills | Status: DC

## 2017-08-16 MED ORDER — DULOXETINE HCL 40 MG PO CPEP
40.0000 mg | ORAL_CAPSULE | Freq: Every day | ORAL | 0 refills | Status: DC
Start: 2017-08-16 — End: 2017-08-17

## 2017-08-16 NOTE — Patient Instructions (Signed)
good to see you  We will see how the shoulder does.  We will do another epidural and then will get you with Dr. Maryjean Ka.  See me again in next 10-14 days and we can inject the knees.

## 2017-08-16 NOTE — Assessment & Plan Note (Signed)
Patient continues to have difficulty. Patient does get some mild improvement with the epidurals. I do believe at this point over 3 in last 12 months she should consider possibly radiofrequency ablation, surgical intervention, or a stimulator. Patient will be referred for further evaluation. Patient will also have an epidural ordered today in case she wants to have that while she is waiting for the referral for the neurosurgery.

## 2017-08-16 NOTE — Assessment & Plan Note (Signed)
Given injection, icing, HEP and pennsaid  RTC in 4 weeks.

## 2017-08-16 NOTE — Telephone Encounter (Signed)
Refill denied. Prescription changed.

## 2017-08-16 NOTE — Progress Notes (Signed)
[name] [address] [place], [place] [address] Phone: [phone] Subjective:      CC: Right arm pain  BMW:[id number]  [name] is a 71 y.o. female coming in with complaint of arm and back pain.   Arm: 2 weeks ago she felt a pop in her right shoulder. Painful in the neck and shoulder area. Worse at night. She reports that she has a sore feeling in her arm. Says she can't lift her arm and movement makes it worse.   Back: Left lower back pain about a week ago. Her pain is worse in the morning. Says it feels like a sore feeling. Walking and stairs makes her pain worse. She's had an epidural recently. Patient states that the epidural to help a little bit but does not seem to be a long-term. Has some 3 in the last 12 month period.  Patient is also complaining of bilateral knee pain. Does have known degenerative joint disease.    Past Medical History:  Diagnosis Date  . Allergic rhinitis, cause unspecified   . Allergy    SEASONAL  . Anemia   . Chronic headaches   . Diverticulosis   . DJD (degenerative joint disease), lumbar   . GERD (gastroesophageal reflux disease)   . Hemorrhoids   . Hiatal hernia   . HTN (hypertension)   . Hyperlipidemia   . Obesity   . Osteoarthritis   . Tubular adenoma of colon    Past Surgical History:  Procedure Laterality Date  . ABLATION    . COLONOSCOPY    . TUBAL LIGATION     Social History   Social History  . Marital status: Widowed    Spouse name: N/A  . Number of children: 3  . Years of education: 12   Occupational History  . retired Unemployed   Social History Main Topics  . Smoking status: Never Smoker  . Smokeless tobacco: Never Used  . Alcohol use No  . Drug use: No  . Sexual activity: No   Other Topics Concern  . None   Social History Narrative  . None   Allergies  Allergen Reactions  . Soybean-Containing Drug Products Other (See Comments)    Allergy testing positive for soy  beans but pt uses soy sauce  . Sulfonamide Derivatives Itching and Rash   Family History  Problem Relation Age of Onset  . Heart disease Mother   . Hypertension Mother   . Diabetes Brother   . Colon cancer Neg Hx   . Esophageal cancer Neg Hx   . Rectal cancer Neg Hx   . Stomach cancer Neg Hx      Past medical history, social, surgical and family history all reviewed in electronic medical record.  No pertanent information unless stated regarding to the chief complaint.   Review of Systems:Review of systems updated and as accurate as of 08/16/17  No headache, visual changes, nausea, vomiting, diarrhea, constipation, dizziness, abdominal pain, skin rash, fevers, chills, night sweats, weight loss, swollen lymph nodes, body aches, joint swelling,  chest pain, shortness of breath, mood changes. Positive muscle aches  Objective  Blood pressure 104/80, pulse 70, height 5\' 3"  (1.6 m), weight 219 lb (99.3 kg), SpO2 95 %. Systems examined below as of 08/16/17   General: No apparent distress alert and oriented x3 mood and affect normal, dressed appropriately.  HEENT: Pupils equal, extraocular movements intact  Respiratory: Patient's speak in full sentences and does not appear short  of breath  Cardiovascular: No lower extremity edema, non tender, no erythema  Skin: Warm dry intact with no signs of infection or rash on extremities or on axial skeleton.  Abdomen: Soft nontender  Neuro: Cranial nerves II through XII are intact, neurovascularly intact in all extremities with 2+ DTRs and 2+ pulses.  Lymph: No lymphadenopathy of posterior or anterior cervical chain or axillae bilaterally.  GaitAntalgic MSK:  Non tender with full range of motion and good stability and symmetric strength and tone of elbows, wrist, hip, and ankles bilaterally.  Shoulder: Right Inspection reveals no abnormalities, atrophy or asymmetry. Palpation is normal with no tenderness over AC joint or bicipital groove. ROM is  full in all planes passively. Rotator cuff strength normal throughout. signs of impingement with positive Neer and Hawkin's tests, but negative empty can sign. Speeds and Yergason's tests normal. No labral pathology noted with negative Obrien's, negative clunk and good stability. Normal scapular function observed. No painful arc and no drop arm sign. No apprehension sign Contralateral shoulder unremarkable  Knee: Bilateral valgus deformity noted. Large thigh to calf ratio.  Tender to palpation over medial and PF joint line.  ROM full in flexion and extension and lower leg rotation. instability with valgus force.  painful patellar compression. Patellar glide with moderate crepitus. Patellar and quadriceps tendons unremarkable. Hamstring and quadriceps strength is normal.  Back Exam:  Inspection: Loss of lordosis Motion: Flexion 45 deg, Extension 15 deg, Side Bending to 25 deg bilaterally,  Rotation to 25 deg bilaterally  SLR laying: Negative tightness bilaterally of the hamstrings XSLR laying: Negative  Palpable tenderness: Tender to palpation in the paraspinal musculature lumbar spine seems to be diffuse. FABER: negative. Sensory change: Gross sensation intact to all lumbar and sacral dermatomes.  Reflexes: 2+ at both patellar tendons, 2+ at achilles tendons, Babinski's downgoing.  Strength at foot  Plantar-flexion: 5/5 Dorsi-flexion: 5/5 Eversion: 5/5 Inversion: 5/5  Leg strength  Quad: 5/5 Hamstring: 5/5 Hip flexor: 5/5 Hip abductors: 5/5  Gait unremarkable.   MSK US performed of: Right This study was ordered, performed, and interpreted by Charlann Boxer D.O.  Shoulder:   Supraspinatus:  Appears normal on long and transverse views, Bursal bulge seen with shoulder abduction on impingement view. Infraspinatus:  Appears normal on long and transverse views. Significant increase in Doppler flow Subscapularis:  Appears normal on long and transverse views. Positive bursa Teres  Minor:  Appears normal on long and transverse views. AC joint:  Capsule undistended, no geyser sign. Glenohumeral Joint:  Appears normal without effusion. Glenoid Labrum:  Intact without visualized tears. Biceps Tendon:  Appears normal on long and transverse views, no fraying of tendon, tendon located in intertubercular groove, no subluxation with shoulder internal or external rotation.  Impression: Subacromial bursitis  Procedure: Real-time Ultrasound Guided Injection of right glenohumeral joint Device: GE Logiq E  Ultrasound guided injection is preferred based studies that show increased duration, increased effect, greater accuracy, decreased procedural pain, increased response rate with ultrasound guided versus blind injection.  Verbal informed consent obtained.  Time-out conducted.  Noted no overlying erythema, induration, or other signs of local infection.  Skin prepped in a sterile fashion.  Local anesthesia: Topical Ethyl chloride.  With sterile technique and under real time ultrasound guidance:  Joint visualized.  23g 1  inch needle inserted posterior approach. Pictures taken for needle placement. Patient did have injection of 2 cc of 1% lidocaine, 2 cc of 0.5% Marcaine, and 1.0 cc of Kenalog 40 mg/dL. Completed without difficulty  Pain immediately resolved suggesting accurate placement of the medication.  Advised to call if fevers/chills, erythema, induration, drainage, or persistent bleeding.  Images permanently stored and available for review in the ultrasound unit.  Impression: Technically successful ultrasound guided injection.    Impression and Recommendations:     This case required medical decision making of moderate complexity.      Note: This dictation was prepared with Dragon dictation along with smaller phrase technology. Any transcriptional errors that result from this process are unintentional.

## 2017-08-16 NOTE — Assessment & Plan Note (Signed)
Worsening symptoms will be having worsening pain will consider injectione.

## 2017-08-17 ENCOUNTER — Other Ambulatory Visit: Payer: Self-pay | Admitting: Family Medicine

## 2017-08-17 MED ORDER — DULOXETINE HCL 20 MG PO CPEP: 40.0000 mg | ORAL_CAPSULE | Freq: Every day | ORAL | 3 refills | Status: DC

## 2017-08-30 ENCOUNTER — Inpatient Hospital Stay
Admission: RE | Admit: 2017-08-30 | Discharge: 2017-08-30 | Disposition: A | Discharge status: Home / Self Care | Payer: Medicare HMO | Source: Ambulatory Visit | Attending: Family Medicine | Admitting: Family Medicine

## 2017-09-02 ENCOUNTER — Other Ambulatory Visit: Payer: Self-pay | Admitting: Internal Medicine

## 2017-09-13 DIAGNOSIS — H01024 Squamous blepharitis left upper eyelid: Secondary | ICD-10-CM | POA: Diagnosis not present

## 2017-09-13 DIAGNOSIS — H2513 Age-related nuclear cataract, bilateral: Secondary | ICD-10-CM | POA: Diagnosis not present

## 2017-09-13 DIAGNOSIS — H01021 Squamous blepharitis right upper eyelid: Secondary | ICD-10-CM | POA: Diagnosis not present

## 2017-09-13 DIAGNOSIS — H10413 Chronic giant papillary conjunctivitis, bilateral: Secondary | ICD-10-CM | POA: Diagnosis not present

## 2017-09-13 DIAGNOSIS — H01025 Squamous blepharitis left lower eyelid: Secondary | ICD-10-CM | POA: Diagnosis not present

## 2017-09-13 DIAGNOSIS — H01022 Squamous blepharitis right lower eyelid: Secondary | ICD-10-CM | POA: Diagnosis not present

## 2017-09-14 ENCOUNTER — Telehealth: Payer: Self-pay | Admitting: Family Medicine

## 2017-09-14 MED ORDER — PREDNISONE 50 MG PO TABS: 50.0000 mg | ORAL_TABLET | Freq: Every day | ORAL | 0 refills | Status: DC

## 2017-09-14 NOTE — Telephone Encounter (Signed)
Sent in prednisone daily for 5 days She has pain medicine from another doctor.

## 2017-09-14 NOTE — Telephone Encounter (Signed)
Discussed with pt

## 2017-09-14 NOTE — Telephone Encounter (Signed)
Patient states she is still having a lot of back pain. She would like to know if she can have something for the pain sent in. Please advise. Thank you.

## 2017-09-23 ENCOUNTER — Ambulatory Visit: Payer: Medicare HMO | Admitting: Family Medicine

## 2017-09-30 ENCOUNTER — Ambulatory Visit: Payer: Medicare HMO | Admitting: Family Medicine

## 2017-10-04 ENCOUNTER — Telehealth: Payer: Self-pay | Admitting: *Deleted

## 2017-10-04 NOTE — Telephone Encounter (Signed)
Pt scheduled for Wednesday at 1:30p.

## 2017-10-04 NOTE — Telephone Encounter (Signed)
I am willing to see her for injections if needed.

## 2017-10-04 NOTE — Telephone Encounter (Signed)
Copied from [place] [id number]. Topic: General - Other >> Oct 04, 2017  8:36 AM Lennox Solders wrote: Reason for CRM:pt had to reschedule her appt with dr Tamala Julian due to weather. Pt is still having back /knee pain and would like to see if dr Tamala Julian is willing to call her something into [facility] [address]. Pt has an appt on 10-20-17

## 2017-10-05 NOTE — Progress Notes (Signed)
[name] [address] [place], [place] [address] Phone: [phone] Subjective:    I'm seeing this patient by the request  of:    CC: Bilateral knee pain, back pain follow-up  [id number]  [name] is a 71 y.o. female coming in for bilateral knee pain. She has had an increase in her pain over the past couple of weeks. She is having constant pain and walking longer distances make her knees hurt.  Severe arthritis.  Walking with a cane.  Affecting daily activities.  Can walk greater than 200 feet.  She has also had an increase in back pain over the past 2 week that is constant in nature.She does have radiating pain down the back of her legs into the calf. She notes pain in the lumbar spine and hips. She has continued to take Cymbalta since her last visit.  Patient was to be referred to [facility] initially to try to see if patient would be a candidate for radiofrequency ablation.  Patient at this point is really considering possible surgical intervention.  Patient states that the pain is unrelenting.  Affecting daily activities including her sleep as well.  Medications that she has including pain medication from primary care providers does not seem to be helping.      Past Medical History:  Diagnosis Date  . Allergic rhinitis, cause unspecified   . Allergy    SEASONAL  . Anemia   . Chronic headaches   . Diverticulosis   . DJD (degenerative joint disease), lumbar   . GERD (gastroesophageal reflux disease)   . Hemorrhoids   . Hiatal hernia   . HTN (hypertension)   . Hyperlipidemia   . Obesity   . Osteoarthritis   . Tubular adenoma of colon    Past Surgical History:  Procedure Laterality Date  . ABLATION    . COLONOSCOPY    . TUBAL LIGATION     Social History   Socioeconomic History  . Marital status: Widowed    Spouse name: None  . Number of children: 3  . Years of education: 70  . Highest education level: None  Social Needs  .  Financial resource strain: None  . Food insecurity - worry: None  . Food insecurity - inability: None  . Transportation needs - medical: None  . Transportation needs - non-medical: None  Occupational History  . Occupation: retired    Fish farm manager: UNEMPLOYED  Tobacco Use  . Smoking status: Never Smoker  . Smokeless tobacco: Never Used  Substance and Sexual Activity  . Alcohol use: No    Alcohol/week: 0.0 oz  . Drug use: No  . Sexual activity: No  Other Topics Concern  . None  Social History Narrative  . None   Allergies  Allergen Reactions  . 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 Sulfonamide Derivatives Itching and Rash   Family History  Problem Relation Age of Onset  . Heart disease Mother   . Hypertension Mother   . Diabetes Brother   . Colon cancer Neg Hx   . Esophageal cancer Neg Hx   . Rectal cancer Neg Hx   . Stomach cancer Neg Hx      Past medical history, social, surgical and family history all reviewed in electronic medical record.  No pertanent information unless stated regarding to the chief complaint.   Review of Systems:Review of systems updated  and as accurate as of 10/06/17  No headache, visual changes, nausea, vomiting, diarrhea, constipation, dizziness, abdominal pain, skin rash, fevers, chills, night sweats, weight loss, swollen lymph nodes, , chest pain, shortness of breath, mood changes.  Positive muscle aches body aches, joint swelling  Objective  Blood pressure 118/82, pulse 79, height 5\' 2"  (1.575 m), weight 219 lb (99.3 kg), SpO2 98 %. Systems examined below as of 10/06/17   General: No apparent distress alert and oriented x3 mood and affect normal, dressed appropriately.  HEENT: Pupils equal, extraocular movements intact  Respiratory: Patient's speak in full sentences and does not appear short of breath  Cardiovascular: No lower extremity edema, non tender, no  erythema  Skin: Warm dry intact with no signs of infection or rash on extremities or on axial skeleton.  Abdomen: Soft nontender obese Neuro: Cranial nerves II through XII are intact, neurovascularly intact in all extremities with 2+ DTRs and 2+ pulses.  Lymph: No lymphadenopathy of posterior or anterior cervical chain or axillae bilaterally.  Gait antalgic gait walking with the aid of a cane MSK: Mild tender with full range of motion and good stability and symmetric strength and tone of shoulders, elbows, wrist, hip, and ankles bilaterally.  Knee: Bilateral valgus deformity noted. Large thigh to calf ratio.  Tender to palpation over medial and PF joint line.  ROM full in flexion and extension and lower leg rotation. instability with valgus force.  painful patellar compression. Patellar glide with moderate crepitus. Patellar and quadriceps tendons unremarkable. Hamstring and quadriceps strength is normal.   Back exam so severe tightness in all planes.  Patient does have loss of lordosis with some mild degenerative scoliosis.  Patient does have a positive straight leg test on the right side in the L4-L5 distribution at 30 degrees of flexion.  Deep tendon reflexes are intact.  4 out of 5 strength compared to the contralateral side.  After informed written and verbal consent, patient was seated on exam table. Right knee was prepped with alcohol swab and utilizing anterolateral approach, patient's right knee space was injected with 4:1  marcaine 0.5%: Kenalog 40mg /dL. Patient tolerated the procedure well without immediate complications.  After informed written and verbal consent, patient was seated on exam table. Left knee was prepped with alcohol swab and utilizing anterolateral approach, patient's left knee space was injected with 4:1  marcaine 0.5%: Kenalog 40mg /dL. Patient tolerated the procedure well without immediate complications.   Impression and Recommendations:     This case required  medical decision making of moderate complexity.      Note: This dictation was prepared with Dragon dictation along with smaller phrase technology. Any transcriptional errors that result from this process are unintentional.

## 2017-10-06 ENCOUNTER — Ambulatory Visit: Payer: Medicare HMO | Admitting: Family Medicine

## 2017-10-06 ENCOUNTER — Encounter: Payer: Self-pay | Admitting: Family Medicine

## 2017-10-06 DIAGNOSIS — M170 Bilateral primary osteoarthritis of knee: Secondary | ICD-10-CM | POA: Diagnosis not present

## 2017-10-06 DIAGNOSIS — M48062 Spinal stenosis, lumbar region with neurogenic claudication: Secondary | ICD-10-CM

## 2017-10-06 NOTE — Assessment & Plan Note (Signed)
Patient had another injection today bilaterally.  Does not want any surgical intervention.  We discussed the possibility of Visco supplementation again.  We discussed icing regimen.  Follow-up again 4 weeks

## 2017-10-06 NOTE — Assessment & Plan Note (Signed)
MRI shows some spinal stenosis Patient is also found to have more of a nerve root impingement on the right side.  Patient has had multiple nerve root injections.  Patient will have a fourth in the last 12 months ordered today.  I would like patient to be referred for neurosurgery as well as a possible surgical intervention.  We discussed her as being a potential candidate for radiofrequency ablation but secondary to the discomfort and pain starting to truly affect daily activities with minimal improvement with all the other medications I do feel that patient could benefit more from surgical intervention.  Patient is in agreement with plan.

## 2017-10-06 NOTE — Patient Instructions (Signed)
Good to see you  [name] is your friend We will do one more injection just to see if it helps.  We will call and see if we can get you in.  Injected the knees today as well  Duexis 3 times a day for 3 days.  Continue the cymbalta See me again in 4 weeks.

## 2017-10-20 ENCOUNTER — Ambulatory Visit: Payer: Medicare HMO | Admitting: Family Medicine

## 2017-11-01 ENCOUNTER — Telehealth: Payer: Self-pay | Admitting: Internal Medicine

## 2017-11-01 MED ORDER — PRAMOXINE-HC 1-1 % EX CREA: TOPICAL_CREAM | Freq: Three times a day (TID) | CUTANEOUS | 1 refills | Status: DC

## 2017-11-01 NOTE — Telephone Encounter (Signed)
Let her know that Medicare will not cover Anusol HC suppositories I recommend that she try the Preparation H suppository (assuming that the Preparation H she already tried was the cream) per box instruction You can prescribe Analpram 2-3 times a day for topical treatment RectiCare is also recommended for the burning and discomfort per box instruction

## 2017-11-01 NOTE — Telephone Encounter (Signed)
Spoke with pt and she is aware, script sent to pharmacy for Analpram.

## 2017-11-01 NOTE — Telephone Encounter (Signed)
Pt states she thinks she is having some issues with her hemorrhoids. Reports she is having rectal burning and itching, also states she has seen some BRB on the tissue with wiping. She reports trying prep H but statse it is not helping much at all. She has OV scheduled for March but wants to know what she can do until that appt. Please advise.

## 2017-11-03 ENCOUNTER — Encounter: Payer: Self-pay | Admitting: Family Medicine

## 2017-11-03 ENCOUNTER — Ambulatory Visit (INDEPENDENT_AMBULATORY_CARE_PROVIDER_SITE_OTHER): Payer: Medicare HMO | Admitting: Family Medicine

## 2017-11-03 DIAGNOSIS — M48062 Spinal stenosis, lumbar region with neurogenic claudication: Secondary | ICD-10-CM | POA: Diagnosis not present

## 2017-11-03 DIAGNOSIS — M170 Bilateral primary osteoarthritis of knee: Secondary | ICD-10-CM | POA: Diagnosis not present

## 2017-11-03 NOTE — Patient Instructions (Signed)
Good to see you  We can repeat injections in the knees in 6 weeks.  The back we will see if Dr. Maryjean Ka has other ideas for you  [name] is your friend.  pennsaid pinkie amount topically 2 times daily as needed.   Stay active though  I like bike, elliptical and machine weights only at the gym [employer] water aerobics for you as the best bet  See me again in 6 weeks

## 2017-11-03 NOTE — Assessment & Plan Note (Signed)
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  Significant spinal stenosis but truly any L5 nerve root seems to be the pain gives her the most discomfort and pain.  Patient has responded well to nerve root injections but no longer seems to be making as much benefit.  Patient has been referred and has an appointment at the month.  Possible candidate for radiofrequency ablation.  We will discuss with the other physician.  Otherwise patient may need surgical intervention.  Me as needed

## 2017-11-03 NOTE — Progress Notes (Signed)
[name] [place],  [address] Phone: [phone] Subjective:      CC: Bilateral knee pain and back pain follow-up  [id number]  [name] is a 72 y.o. female coming in with complaint of bilateral knee pain.  Severe arthritis.  Given injections 1 month ago.  Was to continue with conservative therapy.  Patient has declined any type of surgical intervention for quite some time.  Patient was to consider Visco supplementation again which patient did have some relief.  Patient was also to continue the Cymbalta patient states doing well.  60-80% better.  Does not feel like she wants to do anything else at this time.  Feels she can do most of her daily activities just fine.  Patient does have severe back pain as well.  Has had an MRI independently visualized by me showing spinal stenosis.  Had responded fairly well to nerve root injections previously. Patient was ordered to have another one but patient declined.  Patient states she is now scheduled to see Dr. Maryjean Ka to see if other possible injections would be beneficial.  Still has pain daily.  Some radicular symptoms.  He feels that the pain in her legs is likely more secondary to the back pain it is her knees.     Past Medical History:  Diagnosis Date  . Allergic rhinitis, cause unspecified   . Allergy    SEASONAL  . Anemia   . Chronic headaches   . Diverticulosis   . DJD (degenerative joint disease), lumbar   . GERD (gastroesophageal reflux disease)   . Hemorrhoids   . Hiatal hernia   . HTN (hypertension)   . Hyperlipidemia   . Obesity   . Osteoarthritis   . Tubular adenoma of colon    Past Surgical History:  Procedure Laterality Date  . ABLATION    . COLONOSCOPY    . TUBAL LIGATION     Social History   Socioeconomic History  . Marital status: Widowed    Spouse name: None  . Number of children: 3  . Years of education: 71  . Highest education level: None    Social Needs  . Financial resource strain: None  . Food insecurity - worry: None  . Food insecurity - inability: None  . Transportation needs - medical: None  . Transportation needs - non-medical: None  Occupational History  . Occupation: retired    Fish farm manager: UNEMPLOYED  Tobacco Use  . Smoking status: Never Smoker  . Smokeless tobacco: Never Used  Substance and Sexual Activity  . Alcohol use: No    Alcohol/week: 0.0 oz  . Drug use: No  . Sexual activity: No  Other Topics Concern  . None  Social History Narrative  . None   Allergies  Allergen Reactions  . 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 Sulfonamide Derivatives Itching and Rash   Family History  Problem Relation Age of Onset  . Heart disease Mother   . Hypertension Mother   . Diabetes Brother   . Colon cancer Neg Hx   . Esophageal cancer Neg Hx   . Rectal cancer Neg Hx   . Stomach cancer Neg Hx      Past medical history, social, surgical and family history all reviewed in electronic medical record.  No pertanent information unless stated regarding to the chief complaint.   Review of Systems:Review of  systems updated and as accurate as of 11/03/17  No headache, visual changes, nausea, vomiting, diarrhea, constipation, dizziness, abdominal pain, skin rash, fevers, chills, night sweats, weight loss, swollen lymph nodes, body aches, joint swelling,  chest pain, shortness of breath, mood changes.  Positive muscle aches  Objective  Blood pressure (!) 124/94, pulse 65, height 5\' 3"  (1.6 m), weight 217 lb (98.4 kg), SpO2 97 %. Systems examined below as of 11/03/17   General: No apparent distress alert and oriented x3 mood and affect normal, dressed appropriately.  HEENT: Pupils equal, extraocular movements intact  Respiratory: Patient's speak in full sentences and does not appear short of breath  Cardiovascular: No lower extremity  edema, non tender, no erythema  Skin: Warm dry intact with no signs of infection or rash on extremities or on axial skeleton.  Abdomen: Soft nontender  Neuro: Cranial nerves II through XII are intact, neurovascularly intact in all extremities with 2+ DTRs and 2+ pulses.  Lymph: No lymphadenopathy of posterior or anterior cervical chain or axillae bilaterally.  Gait mild antalgic gait.  MSK:  Non tender with full range of motion and good stability and symmetric strength and tone of shoulders, elbows, wrist, hip, and ankles bilaterally.  Knee exam bilaterally still shows a valgus deformity.  Patient does have some instability with varus force.  Trace effusion noted with some tenderness over the medial joint line bilaterally right greater than left.  Mild crepitus with range of motion.  Back exam shows the patient still has significant tightness.  Decreased range of motion in all planes.  Unable to do [place] secondary to pain.  Positive straight leg test on the right.  More in the L5 distribution.  Mild 4+ out of 5 strength with dorsiflexion of the right foot compared to the contralateral side.  Deep tendon reflexes intact    Impression and Recommendations:     This case required medical decision making of moderate complexity.      Note: This dictation was prepared with Dragon dictation along with smaller phrase technology. Any transcriptional errors that result from this process are unintentional.

## 2017-11-03 NOTE — Assessment & Plan Note (Signed)
Severe overall.  We discussed with patient in great length about icing regimen and home exercises.  We discussed activities to avoid.  Discussed with patient about the possibility of Visco supplementation.  Patient will continue with conservative therapy and see me again in 6 weeks

## 2017-11-10 DIAGNOSIS — M48062 Spinal stenosis, lumbar region with neurogenic claudication: Secondary | ICD-10-CM | POA: Diagnosis not present

## 2017-11-16 ENCOUNTER — Encounter: Payer: Self-pay | Admitting: Internal Medicine

## 2017-11-16 ENCOUNTER — Ambulatory Visit: Payer: Medicare HMO | Admitting: Internal Medicine

## 2017-11-16 DIAGNOSIS — J320 Chronic maxillary sinusitis: Secondary | ICD-10-CM

## 2017-11-16 DIAGNOSIS — J302 Other seasonal allergic rhinitis: Secondary | ICD-10-CM

## 2017-11-16 DIAGNOSIS — J3089 Other allergic rhinitis: Secondary | ICD-10-CM | POA: Diagnosis not present

## 2017-11-16 NOTE — Assessment & Plan Note (Signed)
No recent sinus infection.  Discussed use of nasal saline rinse prophylaxis.

## 2017-11-16 NOTE — Progress Notes (Signed)
HPI female never smoker followed for allergic rhinitis, recurrent sinusitis, food allergy Allergy Profile 09/18/2013-total IgE 48. Broadly positive for common allergens including aspergillus. Food profile positive for peanut, soybean. She reports banana and melons make her throat itch.  -------------------------------------------------------------------------.  11/16/2016-72 year old female never smoker followed for Allergic rhinitis, recurrent sinusitis, food allergy FOLOWS FOR: Pt c/o cough, sinus congestion and SOB x 1 month. Not taking anything for the sinus congestion at this time. Cough is mostly dry, Using Tessalon PRN Has noted shortness of breath mainly with exertion over the last month or so. Denies chest pain, swelling, palpitation. Developed bronchitis 2 or 3 weeks ago which improved after Z-Pak and prednisone. Residual dry cough-Tessalon Perles are helping. Had epistaxis with dry heat plus Flonase.  11/16/17- 72 year old female never smoker followed for Allergic rhinitis, recurrent sinusitis, food allergy ---- Pt states she is doing well overall but notes increase in congestion in the summer time. Nasacort or Flonase, Mucinex, Astelin, Zyrtec Quite comfortable currently.  Warmer months are when she has more nasal congestion.  Denies chest congestion, cough or wheeze.  No recent sinus infection or acute issue.  We reviewed her medications including steroid nasal spray, Astelin, Mucinex, Zyrtec.  No significant headache, no purulent discharge or bleeding. CXR 07/26/17 IMPRESSION: No active cardiopulmonary disease.  ROS-see HPI   + = positive Constitutional:   No-   weight loss, night sweats, fevers, chills, fatigue, lassitude. HEENT:   + headaches, no-difficulty swallowing, tooth/dental problems, sore throat,       No-  sneezing, +itching, +ear ache, +nasal congestion, post nasal drip,  CV:  No-   chest pain, orthopnea, PND, swelling in lower extremities, anasarca, dizziness,  palpitations Resp: No-   shortness of breath with exertion or at rest.              No-productive cough, +non-productive cough,  No- coughing up of blood.              No-   change in color of mucus.  No- wheezing.   Skin: No-   rash or lesions. GI:  No-   heartburn, indigestion, abdominal pain, nausea, vomiting,  GU: . MS:  No-   joint pain or swelling.   Neuro-     nothing unusual Psych:  No- change in mood or affect. No depression or anxiety.  No memory loss.  OBJ- Physical Exam General- Alert, Oriented, Affect-appropriate, Distress- none acute. + Obese Skin- rash-none, lesions- none, excoriation- none Lymphadenopathy- none Head- atraumatic            Eyes- Gross vision intact, PERRLA, conjunctivae and secretions clear            Ears- Hearing, canals-normal            Nose- clear, no-Septal dev, mucus, polyps, erosion, perforation, looks healthy            Throat- Mallampati III , mucosa clear , drainage- none, tonsils- atrophic Neck- flexible , trachea midline, no stridor , thyroid nl, carotid no bruit Chest - symmetrical excursion , unlabored           Heart/CV- RRR , no murmur , no gallop  , no rub, nl s1 s2                           - JVD- none , edema- none, stasis changes- none, varices- none           Lung- clear to P&A,  wheeze- none, cough+ dry , dullness-none, rub- none           Chest wall-  Abd- Br/ Gen/ Rectal- Not done, not indicated Extrem- cyanosis- none, clubbing, none, atrophy- none, strength- nl Neuro- grossly intact to observation

## 2017-11-16 NOTE — Assessment & Plan Note (Signed)
Doing well today.  Some mild occasional nasal congestion but symptoms are mostly associated with spring, summer, fall.  We discussed use of steroid nasal sprays, antihistamines, mucolytics.  We will bring her back in the summer when she expects to be more symptomatic.

## 2017-11-16 NOTE — Patient Instructions (Signed)
Ok to continue with the current meds you find do help you.  Remember to use the Neti pot or squeeze bottle to rinse your nose with salt water if you need to - that has helped in the past.  :Please call if we can help

## 2017-11-19 ENCOUNTER — Other Ambulatory Visit: Payer: Self-pay | Admitting: Internal Medicine

## 2017-11-19 DIAGNOSIS — Z139 Encounter for screening, unspecified: Secondary | ICD-10-CM

## 2017-12-15 ENCOUNTER — Ambulatory Visit: Payer: Medicare HMO | Admitting: Family Medicine

## 2017-12-15 DIAGNOSIS — M48062 Spinal stenosis, lumbar region with neurogenic claudication: Secondary | ICD-10-CM | POA: Diagnosis not present

## 2017-12-15 NOTE — Progress Notes (Signed)
[name] [address] [place], [place] [address] Phone: [phone] Subjective:    I'm seeing this patient by the request  of:    CC: Bilateral knee pain follow-up [id number]  [name] is a 72 y.o. female coming in with complaint of bilateral knee pain.  Patient states that he is having worsening pain again.  Has known severe arthritis.  Last injections were 10 weeks ago.  Does not want to have any surgical intervention.  Cane.  Has known spinal stenosis.  Patient is seeing a specialist at this time.  Was started on the medication cannot remember.  Has not taken it yet but is considering it.  States that it is stable.      Past Medical History:  Diagnosis Date  . Allergic rhinitis, cause unspecified   . Allergy    SEASONAL  . Anemia   . Chronic headaches   . Diverticulosis   . DJD (degenerative joint disease), lumbar   . GERD (gastroesophageal reflux disease)   . Hemorrhoids   . Hiatal hernia   . HTN (hypertension)   . Hyperlipidemia   . Obesity   . Osteoarthritis   . Tubular adenoma of colon    Past Surgical History:  Procedure Laterality Date  . ABLATION    . COLONOSCOPY    . TUBAL LIGATION     Social History   Socioeconomic History  . Marital status: Widowed    Spouse name: Not on file  . Number of children: 3  . Years of education: 48  . Highest education level: Not on file  Social Needs  . Financial resource strain: Not on file  . Food insecurity - worry: Not on file  . Food insecurity - inability: Not on file  . Transportation needs - medical: Not on file  . Transportation needs - non-medical: Not on file  Occupational History  . Occupation: retired    Fish farm manager: UNEMPLOYED  Tobacco Use  . Smoking status: Never Smoker  . Smokeless tobacco: Never Used  Substance and Sexual Activity  . Alcohol use: No    Alcohol/week: 0.0 oz  . Drug use: No  . Sexual activity: No  Other Topics Concern  . Not on file    Social History Narrative  . Not on file   Allergies  Allergen Reactions  . 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 Sulfonamide Derivatives Itching and Rash   Family History  Problem Relation Age of Onset  . Heart disease Mother   . Hypertension Mother   . Diabetes Brother   . Colon cancer Neg Hx   . Esophageal cancer Neg Hx   . Rectal cancer Neg Hx   . Stomach cancer Neg Hx      Past medical history, social, surgical and family history all reviewed in electronic medical record.  No pertanent information unless stated regarding to the chief complaint.   Review of Systems:Review of systems updated and as accurate as of 12/16/17  No headache, visual changes, nausea, vomiting, diarrhea, constipation, dizziness, abdominal pain, skin rash, fevers, chills, night sweats, weight loss, swollen lymph nodes, body aches, chest pain, shortness of breath, mood changes.  Positive muscle aches, joint swelling  Objective  Systems examined below as of 12/16/17   General: No apparent distress alert and oriented x3 mood and affect normal, dressed appropriately.  HEENT: Pupils equal, extraocular movements intact  Respiratory: Patient's speak in full sentences and does not appear short of breath  Cardiovascular: No lower extremity edema, non tender, no erythema  Skin: Warm dry intact with no signs of infection or rash on extremities or on axial skeleton.  Abdomen: Soft nontender  Neuro: Cranial nerves II through XII are intact, neurovascularly intact in all extremities with 2+ DTRs and 2+ pulses.  Lymph: No lymphadenopathy of posterior or anterior cervical chain or axillae bilaterally.  Gait antalgic gait MSK:  Non tender with full range of motion and good stability and symmetric strength and tone of shoulders, elbows, wrist, hip and ankles bilaterally.  Knee: Bilateral valgus deformity noted. Large thigh to calf  ratio.  Tender to palpation over medial and PF joint line.  ROM full in flexion and extension and lower leg rotation. instability with valgus force.  painful patellar compression. Patellar glide with moderate crepitus. Patellar and quadriceps tendons unremarkable. Hamstring and quadriceps strength is normal.  After informed written and verbal consent, patient was seated on exam table. Right knee was prepped with alcohol swab and utilizing anterolateral approach, patient's right knee space was injected with 4:1  marcaine 0.5%: Kenalog 40mg /dL. Patient tolerated the procedure well without immediate complications.  After informed written and verbal consent, patient was seated on exam table. Left knee was prepped with alcohol swab and utilizing anterolateral approach, patient's left knee space was injected with 4:1  marcaine 0.5%: Kenalog 40mg /dL. Patient tolerated the procedure well without immediate complications.   Impression and Recommendations:     This case required medical decision making of moderate complexity.      Note: This dictation was prepared with Dragon dictation along with smaller phrase technology. Any transcriptional errors that result from this process are unintentional.

## 2017-12-16 ENCOUNTER — Other Ambulatory Visit: Payer: Medicare HMO

## 2017-12-16 ENCOUNTER — Encounter: Payer: Self-pay | Admitting: Family Medicine

## 2017-12-16 ENCOUNTER — Telehealth: Payer: Self-pay | Admitting: Internal Medicine

## 2017-12-16 ENCOUNTER — Ambulatory Visit: Payer: Medicare HMO | Admitting: Family Medicine

## 2017-12-16 DIAGNOSIS — M170 Bilateral primary osteoarthritis of knee: Secondary | ICD-10-CM

## 2017-12-16 MED ORDER — AZITHROMYCIN 250 MG PO TABS
ORAL_TABLET | ORAL | 0 refills | Status: DC
Start: 2017-12-16 — End: 2017-12-30

## 2017-12-16 NOTE — Assessment & Plan Note (Signed)
Bilateral injections given.  Discussed icing regimen and home exercises, which activities to do which ones to avoid.  Could be a candidate for Visco supplementation.  Discussed continuing the cane.  Follow-up again in 4 weeks

## 2017-12-16 NOTE — Telephone Encounter (Signed)
Called and spoke with pt and she is aware of recs per CY.  Nothing further is needed.

## 2017-12-16 NOTE — Telephone Encounter (Signed)
Offer Zpak  250 mg, # 6, 2 today then one daily  Ok to use Mucinex-DM if needed for cough and congestion

## 2017-12-16 NOTE — Patient Instructions (Signed)
Good to see you  [name] is your friend.  Injected both knees.  I  [facility] gets your back all tuned up  See me again in 10 weeks!

## 2017-12-16 NOTE — Telephone Encounter (Signed)
Called and spoke with pt and she stated that she is having head congestion, ears are stopped up, cough with white sputum, sneezing, runny nose with white drainage and chills yesterday.   Pt denies any fever or body aches. Pt is wanting to see if CY would call something in for her.  CY -please advise. Thanks  Last ov--11/16/2017 Next ov--05/16/2018  Allergies  Allergen Reactions  . 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 Sulfonamide Derivatives Itching and Rash

## 2017-12-27 ENCOUNTER — Ambulatory Visit: Payer: Medicare HMO

## 2017-12-28 ENCOUNTER — Ambulatory Visit: Payer: Medicare HMO | Admitting: Internal Medicine

## 2017-12-30 ENCOUNTER — Ambulatory Visit (INDEPENDENT_AMBULATORY_CARE_PROVIDER_SITE_OTHER): Payer: Medicare HMO | Admitting: Internal Medicine

## 2017-12-30 ENCOUNTER — Encounter: Payer: Self-pay | Admitting: Internal Medicine

## 2017-12-30 VITALS — BP 114/74 | HR 68 | Ht 60.25 in | Wt 216.0 lb

## 2017-12-30 DIAGNOSIS — K219 Gastro-esophageal reflux disease without esophagitis: Secondary | ICD-10-CM | POA: Diagnosis not present

## 2017-12-30 DIAGNOSIS — K5900 Constipation, unspecified: Secondary | ICD-10-CM

## 2017-12-30 DIAGNOSIS — K649 Unspecified hemorrhoids: Secondary | ICD-10-CM | POA: Diagnosis not present

## 2017-12-30 DIAGNOSIS — Z86010 Personal history of colonic polyps: Secondary | ICD-10-CM

## 2017-12-30 MED ORDER — RANITIDINE HCL 150 MG PO TABS: ORAL_TABLET | ORAL | 6 refills | Status: DC

## 2017-12-30 MED ORDER — ESOMEPRAZOLE MAGNESIUM 40 MG PO CPDR: 40.0000 mg | DELAYED_RELEASE_CAPSULE | Freq: Every day | ORAL | 10 refills | Status: DC

## 2017-12-30 NOTE — Patient Instructions (Signed)
Continue Nexium.  Continue ranitidine every evening as needed.  Please follow up with Dr Hilarie Fredrickson in 1 year.  If you are age 71 or older, your body mass index should be between 23-30. Your Body mass index is 41.84 kg/m. If this is out of the aforementioned range listed, please consider follow up with your Primary Care Provider.  If you are age 77 or younger, your body mass index should be between 19-25. Your Body mass index is 41.84 kg/m. If this is out of the aformentioned range listed, please consider follow up with your Primary Care Provider.

## 2017-12-30 NOTE — Progress Notes (Signed)
Subjective:    Patient ID: [name], female    DOB: 1946-04-20, 72 y.o.   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  She reports that she has been doing well.  She did have hemorrhoidal symptoms in January but unfortunately the hydrocortisone suppositories were too expensive.  She used hydrocortisone cream with resolution of her symptoms.  She is noticed some mild intermittent constipation which responds to increasing fiber in her diet with foods such as oatmeal.  She does not feel that she needs a laxative.  No blood in her stool or melena.  Her reflux is been under good control with Nexium 40 mg daily.  Occasionally she will have some indigestion in the evening which is relieved by ranitidine.  No dysphagia or odynophagia.  No nausea or vomiting.  She is having bilateral knee pain and sees Dr. Tamala Julian.  She also has a lumbar disc issue and has had several steroid injections.  She is considering neurosurgical procedure.  She is also using tramadol for this pain.   Previous evaluation of IDA was in February 2016.  Upper endoscopy, colonoscopy, and video capsule endoscopy were performed.  Upper endoscopy showed H. pylori negative gastritis, 5 mm colonic adenoma and mild diverticulosis.  VCE was normal.  Review of Systems As per HPI, otherwise negative  Current Medications, Allergies, Past Medical History, Past Surgical History, Family History and Social History were reviewed in [employer] record.     Objective:   Physical Exam BP 114/74 (BP Location: Left Wrist, Patient Position: Sitting, Cuff Size: Normal)   Pulse 68   Ht 5' 0.25" (1.53 m)   Wt 216 lb (98 kg)   BMI 41.84 kg/m  Constitutional: Well-developed and well-nourished. No  distress. HEENT: Normocephalic and atraumatic.  Conjunctivae are normal.  No scleral icterus. Neck: Neck supple. Trachea midline. Cardiovascular: Normal rate, regular rhythm and intact distal pulses.  Pulmonary/chest: Effort normal and breath sounds normal. No wheezing, rales or rhonchi. Abdominal: Soft, nontender, nondistended. Bowel sounds active throughout. T Extremities: no clubbing, cyanosis, or edema Neurological: Alert and oriented to person place and time. Skin: Skin is warm and dry. Psychiatric: Normal mood and affect. Behavior is normal.  CBC    Component Value Date/Time   WBC 12.7 (H) 04/07/2017 1027   RBC 4.54 04/07/2017 1027   HGB 13.9 04/07/2017 1027   HCT 41.4 04/07/2017 1027   PLT 319.0 04/07/2017 1027   MCV 91.0 04/07/2017 1027   MCHC 33.6 04/07/2017 1027   RDW 14.6 04/07/2017 1027   LYMPHSABS 2.9 04/07/2017 1027   MONOABS 1.0 04/07/2017 1027   EOSABS 0.4 04/07/2017 1027   BASOSABS 0.1 04/07/2017 1027   CMP     Component Value Date/Time   NA 140 04/07/2017 1027   K 3.6 04/07/2017 1027   CL 101 04/07/2017 1027   CO2 30 04/07/2017 1027   GLUCOSE 99 04/07/2017 1027   BUN 20 04/07/2017 1027   CREATININE 0.80 04/07/2017 1027   CALCIUM 9.7 04/07/2017 1027   PROT 6.7 04/07/2017 1027   ALBUMIN 4.1 04/07/2017 1027   AST 11 04/07/2017 1027   ALT 16 04/07/2017 1027   ALKPHOS 75 04/07/2017 1027   BILITOT 0.7 04/07/2017 1027       Assessment & Plan:  72 year old female with a history  of iron deficiency anemia, GERD, H. pylori negative gastritis, adenomatous colon polyps, internal hemorrhoid status post banding, hypertension, hyperlipidemia and obesity who is here for follow-up.  1.  GERD --history of epigastric pain but none recently.  Symptoms respond well to Nexium 40 mg daily.  Continue Nexium 40 mg daily with ranitidine 150 mg in the evening as needed for breakthrough.  We discussed the risk, benefits and alternatives to chronic PPI and she wishes to continue  this medication.  2.  Hemorrhoids --intermittently an issue.  Responding to hemorrhoidal cream.  Prior banding has resolved hemorrhoidal bleeding.    3.  Mild constipation --possibly related to tramadol which she is using for pain.  Continue high-fiber diet.  We discussed MiraLAX should she need a laxative but at present she does not feel this is needed.  4.  History of colon polyps --one small adenoma in 2016, repeat colonoscopy recommended in 2021  5.  History of iron deficiency anemia --resolved.  She takes iron once daily.  Primary care will follow her blood counts and iron studies.  15 minutes spent with the patient today. Greater than 50% was spent in counseling and coordination of care with the patient

## 2018-01-06 ENCOUNTER — Other Ambulatory Visit: Payer: Self-pay | Admitting: Family Medicine

## 2018-01-12 ENCOUNTER — Other Ambulatory Visit: Payer: Self-pay | Admitting: Internal Medicine

## 2018-01-12 NOTE — Telephone Encounter (Signed)
Routing to dr john, please advise, thanks 

## 2018-01-18 ENCOUNTER — Ambulatory Visit
Admission: RE | Admit: 2018-01-18 | Discharge: 2018-01-18 | Disposition: A | Discharge status: Home / Self Care | Payer: Medicare HMO | Source: Ambulatory Visit | Attending: Internal Medicine | Admitting: Internal Medicine

## 2018-01-18 DIAGNOSIS — Z1231 Encounter for screening mammogram for malignant neoplasm of breast: Secondary | ICD-10-CM | POA: Diagnosis not present

## 2018-01-18 DIAGNOSIS — Z139 Encounter for screening, unspecified: Secondary | ICD-10-CM

## 2018-01-19 ENCOUNTER — Other Ambulatory Visit: Payer: Self-pay | Admitting: Internal Medicine

## 2018-01-21 ENCOUNTER — Other Ambulatory Visit: Payer: Self-pay | Admitting: Internal Medicine

## 2018-01-21 NOTE — Telephone Encounter (Signed)
Dr Hilarie Fredrickson, do you want patient to continue ferrous sulfate now that her anemia has resolved and PCP will be taking over following her CBC and iron studies?

## 2018-01-24 NOTE — Telephone Encounter (Signed)
Ok to Rx, I expect she will need this chronically, but Hgb and iron can now be followed routinely

## 2018-01-27 ENCOUNTER — Ambulatory Visit (INDEPENDENT_AMBULATORY_CARE_PROVIDER_SITE_OTHER): Payer: Medicare HMO | Admitting: Internal Medicine

## 2018-01-27 ENCOUNTER — Encounter: Payer: Self-pay | Admitting: Internal Medicine

## 2018-01-27 VITALS — BP 114/78 | HR 84 | Temp 98.7°F | Ht 60.25 in | Wt 217.0 lb

## 2018-01-27 DIAGNOSIS — J302 Other seasonal allergic rhinitis: Secondary | ICD-10-CM

## 2018-01-27 DIAGNOSIS — R21 Rash and other nonspecific skin eruption: Secondary | ICD-10-CM | POA: Diagnosis not present

## 2018-01-27 DIAGNOSIS — J3089 Other allergic rhinitis: Secondary | ICD-10-CM | POA: Diagnosis not present

## 2018-01-27 DIAGNOSIS — I10 Essential (primary) hypertension: Secondary | ICD-10-CM

## 2018-01-27 MED ORDER — PREDNISONE 10 MG PO TABS
ORAL_TABLET | ORAL | 0 refills | Status: DC
Start: 2018-01-27 — End: 2018-04-13

## 2018-01-27 MED ORDER — METHYLPREDNISOLONE ACETATE 80 MG/ML IJ SUSP
80.0000 mg | Freq: Once | INTRAMUSCULAR | Status: AC
  Administered 2018-01-27: 80 mg via INTRAMUSCULAR

## 2018-01-27 NOTE — Assessment & Plan Note (Signed)
Most c/w contact allergic type not involving the eye but large area - for depomedrol IM 80, predpac asd

## 2018-01-27 NOTE — Assessment & Plan Note (Signed)
Also likely to improved with steroid tx,  to f/u any worsening symptoms or concerns

## 2018-01-27 NOTE — Progress Notes (Signed)
Subjective:    Patient ID: [name], female    DOB: 1945-11-26, 72 y.o.   MRN: [id number]  HPI  Here with 3 days onset mild constant gradually worsening itchy rash to mid forehead and left facial/max sinus area, without trauma, fever, other swelling or drainage.  Denies any specific contact allergen.  Pt denies chest pain, increased sob or doe, wheezing, orthopnea, PND, increased LE swelling, palpitations, dizziness or syncope.   Pt denies polydipsia, polyuria.  Does have several wks ongoing nasal allergy symptoms with clearish congestion, itch and sneezing, without fever, pain, ST, cough, swelling or wheezing. Past Medical History:  Diagnosis Date  . Allergic rhinitis, cause unspecified   . Allergy    SEASONAL  . Anemia   . Chronic headaches   . Diverticulosis   . DJD (degenerative joint disease), lumbar   . GERD (gastroesophageal reflux disease)   . Hemorrhoids   . Hiatal hernia   . HTN (hypertension)   . Hyperlipidemia   . Obesity   . Osteoarthritis   . Tubular adenoma of colon    Past Surgical History:  Procedure Laterality Date  . ABLATION    . COLONOSCOPY    . TUBAL LIGATION      reports that she has never smoked. She has never used smokeless tobacco. She reports that she does not drink alcohol or use drugs. family history includes Breast cancer in her sister; Diabetes in her brother; Heart disease in her mother; Hypertension in her mother. Allergies  Allergen Reactions  . 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 Sulfonamide Derivatives Itching and Rash   Current Outpatient Medications on File Prior to Visit  Medication Sig Dispense Refill  . atorvastatin (LIPITOR) 20 MG tablet TAKE 1 TABLET EVERY DAY 90 tablet 1  . azelastine (ASTELIN) 0.1 % nasal spray instill 1 to 2 sprays into each nostril at bedtime 30 mL 3  . Black Cohosh 80 MG CAPS Take by mouth.    . cetirizine  (ZYRTEC) 10 MG tablet Take 1 tablet (10 mg total) by mouth daily. As needed 90 tablet 3  . Cholecalciferol (VITAMIN D3) 2000 UNITS capsule Take 2,000 Units by mouth daily.    . clotrimazole-betamethasone (LOTRISONE) cream APPLY TO TO THE AFFECTED AREA TWICE DAILY AS DIRECTED AS NEEDED 15 g 0  . diltiazem (TIAZAC) 120 MG 24 hr capsule TAKE 1 CAPSULE EVERY DAY 90 capsule 1  . DULoxetine (CYMBALTA) 20 MG capsule TAKE 2 CAPSULES BY MOUTH ONCE DAILY 180 capsule 1  . esomeprazole (NEXIUM) 40 MG capsule Take 1 capsule (40 mg total) by mouth daily. 30 capsule 10  . FEROSUL 325 (65 Fe) MG tablet TAKE 1 TABLET BY MOUTH ONCE DAILY WITH BREAKFAST 30 tablet 3  . fluticasone (FLONASE) 50 MCG/ACT nasal spray Place 2 sprays into both nostrils daily. 16 g 0  . guaiFENesin (MUCINEX) 600 MG 12 hr tablet Take 1 tablet (600 mg total) by mouth 2 (two) times daily as needed for cough or to loosen phlegm. 14 tablet 0  . losartan-hydrochlorothiazide (HYZAAR) 100-25 MG tablet TAKE 1 TABLET EVERY DAY 90 tablet 3  . metoprolol tartrate (LOPRESSOR) 50 MG tablet TAKE 1 TABLET TWICE DAILY 180 tablet 3  . ranitidine (ZANTAC) 150 MG tablet Take 1 tablet every evening as needed 30 tablet 6  . sucralfate (CARAFATE) 1 G tablet Take 1 g by mouth as needed. Reported on 12/20/2015    .  tiZANidine (ZANAFLEX) 4 MG capsule Take 8 mg by mouth daily.    . traMADol (ULTRAM) 50 MG tablet Take 1 tablet by mouth every 6 (six) hours as needed.  0  . triamcinolone (NASACORT AQ) 55 MCG/ACT AERO nasal inhaler Place 2 sprays into the nose daily. 1 Inhaler 3  . Turmeric 500 MG CAPS Take 1 capsule by mouth daily.     No current facility-administered medications on file prior to visit.    Review of Systems  Constitutional: Negative for other unusual diaphoresis or sweats HENT: Negative for ear discharge or swelling Eyes: Negative for other worsening visual disturbances Respiratory: Negative for stridor or other swelling  Gastrointestinal: Negative  for worsening distension or other blood Genitourinary: Negative for retention or other urinary change Musculoskeletal: Negative for other MSK pain or swelling Skin: Negative for color change or other new lesions Neurological: Negative for worsening tremors and other numbness  Psychiatric/Behavioral: Negative for worsening agitation or other fatigue All other system neg per pt    Objective:   Physical Exam BP 114/78   Pulse 84   Temp 98.7 F (37.1 C) (Oral)   Ht 5' 0.25" (1.53 m)   Wt 217 lb (98.4 kg)   SpO2 97%   BMI 42.03 kg/m  VS noted,  Constitutional: Pt appears in NAD HENT: Head: NCAT.  Right Ear: External ear normal.  Left Ear: External ear normal.  Eyes: . Pupils are equal, round, and reactive to light. Conjunctivae and EOM are normal Bilat tm's with mild erythema.  Max sinus areas mild tender.  Pharynx with mild erythema, no exudate Nose: without d/c or deformity Neck: Neck supple. Gross normal ROM Cardiovascular: Normal rate and regular rhythm.   Pulmonary/Chest: Effort normal and breath sounds without rales or wheezing.  Neurological: Pt is alert. At baseline orientation, motor grossly intact Skin: Skin is warm. no LE edema, + nontender erythem rash to mid forehead and left max sinus area Psychiatric: Pt behavior is normal without agitation  No other exam findings    Assessment & Plan:

## 2018-01-27 NOTE — Assessment & Plan Note (Signed)
stable overall by history and exam, recent data reviewed with pt, and pt to continue medical treatment as before,  to f/u any worsening symptoms or concerns BP Readings from Last 3 Encounters:  01/27/18 114/78  12/30/17 114/74  12/16/17 118/82

## 2018-01-27 NOTE — Patient Instructions (Signed)
You had the steroid shot today  Please take all new medication as prescribed - the prednisone  Please continue all other medications as before, including the zyrtec  Please have the pharmacy call with any other refills you may need.  Please continue your efforts at being more active, low cholesterol diet, and weight control.  Please keep your appointments with your specialists as you may have planned

## 2018-02-05 IMAGING — XA Imaging study
2 series · 2 of 2 positions shown · non-contrast
Comparison: none

CLINICAL DATA: Lumbosacral spondylosis without myelopathy. Low back
pain radiating into the right lower extremity.

[Series 1: ortho standard · 1 of 1 slices shown (1 of 2)]
[im 1/1]
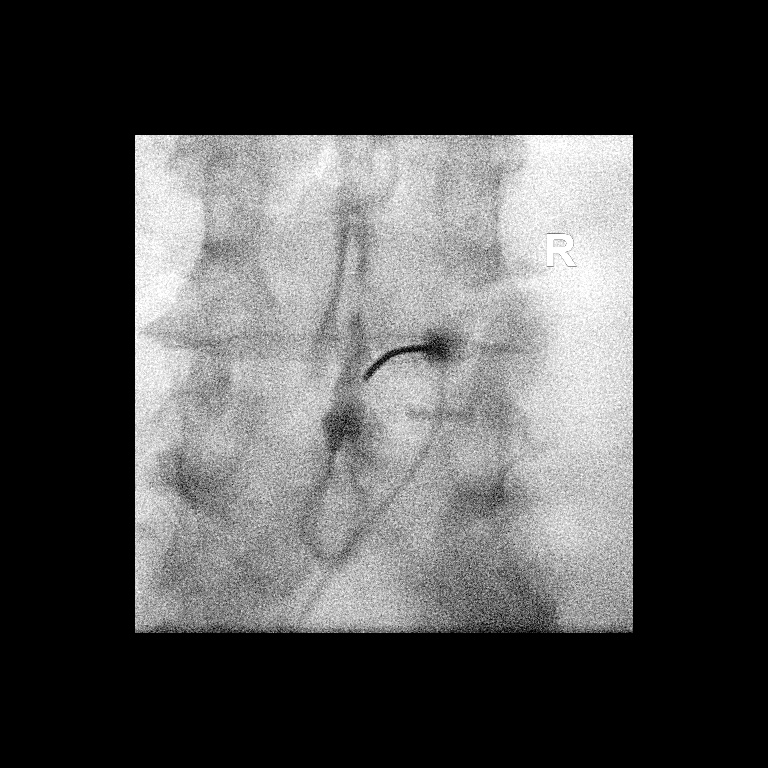

[Series 2: ortho standard · 1 of 1 slices shown (2 of 2)]
[im 1/1]
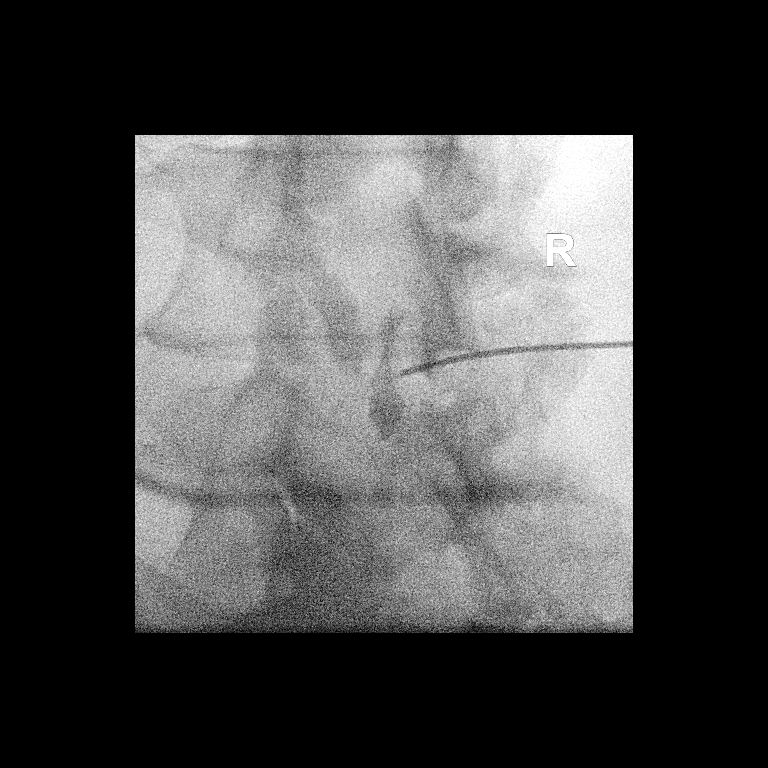

[2 of 2 positions shown; findings below may reference images not displayed]

FLUOROSCOPY TIME:  Radiation Exposure Index (as provided by the
fluoroscopic device): 18.54 microGray*m^2

Fluoroscopy Time (in minutes and seconds):  8 seconds

PROCEDURE:
The procedure, risks, benefits, and alternatives were explained to
the patient. Questions regarding the procedure were encouraged and
answered. The patient understands and consents to the procedure.

LUMBAR EPIDURAL INJECTION:

An interlaminar approach was performed on the right at L3-4. The
overlying skin was cleansed and anesthetized. A 3.5 inch 20 gauge
epidural needle was advanced using loss-of-resistance technique.

DIAGNOSTIC EPIDURAL INJECTION:

Injection of Isovue-M 200 shows a good epidural pattern with spread
above and below the level of needle placement, primarily on the
right. No vascular opacification is seen.

THERAPEUTIC EPIDURAL INJECTION:

120 mg of Depo-Medrol mixed with 2.5 mL of 1% lidocaine were
instilled. The procedure was well-tolerated, and the patient was
discharged thirty minutes following the injection in good condition.

COMPLICATIONS:
None
IMPRESSION: Technically successful lumbar interlaminar epidural injection on the
right at L3-4.

## 2018-02-09 DIAGNOSIS — M48062 Spinal stenosis, lumbar region with neurogenic claudication: Secondary | ICD-10-CM | POA: Diagnosis not present

## 2018-02-22 ENCOUNTER — Other Ambulatory Visit: Payer: Self-pay | Admitting: Internal Medicine

## 2018-02-24 ENCOUNTER — Ambulatory Visit: Payer: Medicare HMO

## 2018-02-27 NOTE — Progress Notes (Signed)
[name] [address], [place] [address] Phone: [phone] Subjective:     CC: Bilateral knee  XBD:[id number]  [name] is a 72 y.o. female coming in with complaint of left knee pain. She has been going to her exercise class which seems to make her knee and back looser. She is feeling stiff today because of the weather. She does still have pain when going from sitting to standing.  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  Patient states that overall has been trying to work out on a more regular basis and doing more aquatic therapy and has noticed improvement.      Past Medical History:  Diagnosis Date  . Allergic rhinitis, cause unspecified   . Allergy    SEASONAL  . Anemia   . Chronic headaches   . Diverticulosis   . DJD (degenerative joint disease), lumbar   . GERD (gastroesophageal reflux disease)   . Hemorrhoids   . Hiatal hernia   . HTN (hypertension)   . Hyperlipidemia   . Obesity   . Osteoarthritis   . Tubular adenoma of colon    Past Surgical History:  Procedure Laterality Date  . ABLATION    . COLONOSCOPY    . TUBAL LIGATION     Social History   Socioeconomic History  . Marital status: Widowed    Spouse name: Not on file  . Number of children: 3  . Years of education: 29  . Highest education level: Not on file  Occupational History  . Occupation: retired    Fish farm manager: UNEMPLOYED  Social Needs  . Financial resource strain: Not on file  . Food insecurity:    Worry: Not on file    Inability: Not on file  . Transportation needs:    Medical: Not on file    Non-medical: Not on file  Tobacco Use  . Smoking status: Never Smoker  . Smokeless tobacco: Never Used  Substance and Sexual Activity  . Alcohol use: No    Alcohol/week: 0.0 oz  . Drug use: No  . Sexual activity: Never  Lifestyle  . Physical activity:    Days per week: Not on file    Minutes per session: Not on file  .  Stress: Not on file  Relationships  . Social connections:    Talks on phone: Not on file    Gets together: Not on file    Attends religious service: Not on file    Active member of club or organization: Not on file    Attends meetings of clubs or organizations: Not on file    Relationship status: Not on file  Other Topics Concern  . Not on file  Social History Narrative  . Not on file   Allergies  Allergen Reactions  . 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 Sulfonamide Derivatives Itching and Rash   Family History  Problem Relation Age of Onset  . Heart disease Mother   . Hypertension Mother   . Diabetes Brother   . Breast cancer Sister   . Colon cancer Neg Hx   . Esophageal cancer Neg Hx   . Rectal cancer Neg Hx   . Stomach cancer Neg Hx      Past medical history, social, surgical and family history all reviewed in electronic medical record.  No pertanent information unless stated regarding  to the chief complaint.   Review of Systems:Review of systems updated and as accurate as of 02/28/18  No headache, visual changes, nausea, vomiting, diarrhea, constipation, dizziness, abdominal pain, skin rash, fevers, chills, night sweats, weight loss, swollen lymph nodes, body aches, joint swelling,  chest pain, shortness of breath, mood changes.  Positive muscle aches  Objective  Blood pressure 122/72, pulse 62, height 5' 0.25" (1.53 m), weight 212 lb (96.2 kg), SpO2 93 %. Systems examined below as of 02/28/18   General: No apparent distress alert and oriented x3 mood and affect normal, dressed appropriately.  HEENT: Pupils equal, extraocular movements intact  Respiratory: Patient's speak in full sentences and does not appear short of breath  Cardiovascular: No lower extremity edema, non tender, no erythema  Skin: Warm dry intact with no signs of infection or rash on extremities or on axial skeleton.   Abdomen: Soft nontender  Neuro: Cranial nerves II through XII are intact, neurovascularly intact in all extremities with 2+ DTRs and 2+ pulses.  Lymph: No lymphadenopathy of posterior or anterior cervical chain or axillae bilaterally.  Gait antalgic gait MSK:  Non tender with full range of motion and good stability and symmetric strength and tone of shoulders, elbows, wrist, hip, and ankles bilaterally.  Knee: Bilateral valgus deformity noted. Large thigh to calf ratio.  Tender to palpation over medial and PF joint line.  ROM full in flexion and extension and lower leg rotation. instability with valgus force.  painful patellar compression. Patellar glide with moderate crepitus. Patellar and quadriceps tendons unremarkable. Hamstring and quadriceps strength is normal.  After informed written and verbal consent, patient was seated on exam table. Right knee was prepped with alcohol swab and utilizing anterolateral approach, patient's right knee space was injected with 4:1  marcaine 0.5%: Kenalog 40mg /dL. Patient tolerated the procedure well without immediate complications.  After informed written and verbal consent, patient was seated on exam table. Left knee was prepped with alcohol swab and utilizing anterolateral approach, patient's left knee space was injected with 4:1  marcaine 0.5%: Kenalog 40mg /dL. Patient tolerated the procedure well without immediate complications.    Impression and Recommendations:     This case required medical decision making of moderate complexity.      Note: This dictation was prepared with Dragon dictation along with smaller phrase technology. Any transcriptional errors that result from this process are unintentional.       ;

## 2018-02-28 ENCOUNTER — Ambulatory Visit: Payer: Medicare HMO | Admitting: Family Medicine

## 2018-02-28 ENCOUNTER — Encounter: Payer: Self-pay | Admitting: Family Medicine

## 2018-02-28 DIAGNOSIS — M170 Bilateral primary osteoarthritis of knee: Secondary | ICD-10-CM

## 2018-02-28 NOTE — Assessment & Plan Note (Signed)
Patient was given injection.  Discussed icing regimen and home exercises.  Patient is to start avoiding certain activities.  I discussed topical anti-inflammatories.  Can repeat injections every 10 weeks if needed.

## 2018-02-28 NOTE — Patient Instructions (Signed)
Good to see you  Ice is your friend You know the drill  You look great and keep it up! See me again in 10 weeks

## 2018-03-07 ENCOUNTER — Other Ambulatory Visit: Payer: Self-pay | Admitting: Internal Medicine

## 2018-04-13 ENCOUNTER — Encounter: Payer: Self-pay | Admitting: Internal Medicine

## 2018-04-13 ENCOUNTER — Other Ambulatory Visit (INDEPENDENT_AMBULATORY_CARE_PROVIDER_SITE_OTHER): Payer: Medicare HMO

## 2018-04-13 ENCOUNTER — Ambulatory Visit (INDEPENDENT_AMBULATORY_CARE_PROVIDER_SITE_OTHER): Payer: Medicare HMO | Admitting: Internal Medicine

## 2018-04-13 VITALS — BP 130/80 | HR 73 | Ht 60.25 in | Wt 214.0 lb

## 2018-04-13 DIAGNOSIS — Z0000 Encounter for general adult medical examination without abnormal findings: Secondary | ICD-10-CM

## 2018-04-13 DIAGNOSIS — Z23 Encounter for immunization: Secondary | ICD-10-CM | POA: Diagnosis not present

## 2018-04-13 DIAGNOSIS — I10 Essential (primary) hypertension: Secondary | ICD-10-CM | POA: Diagnosis not present

## 2018-04-13 DIAGNOSIS — R739 Hyperglycemia, unspecified: Secondary | ICD-10-CM | POA: Insufficient documentation

## 2018-04-13 LAB — BASIC METABOLIC PANEL WITH GFR
BUN: 21 mg/dL (ref 6–23)
CO2: 33 meq/L — ABNORMAL HIGH (ref 19–32)
Calcium: 9.7 mg/dL (ref 8.4–10.5)
Chloride: 100 meq/L (ref 96–112)
Creatinine, Ser: 0.83 mg/dL (ref 0.40–1.20)
GFR: 87.1 mL/min
Glucose, Bld: 109 mg/dL — ABNORMAL HIGH (ref 70–99)
Potassium: 3.7 meq/L (ref 3.5–5.1)
Sodium: 141 meq/L (ref 135–145)

## 2018-04-13 LAB — URINALYSIS, ROUTINE W REFLEX MICROSCOPIC
Bilirubin Urine: NEGATIVE
Hgb urine dipstick: NEGATIVE
Ketones, ur: NEGATIVE
Leukocytes, UA: NEGATIVE
Nitrite: NEGATIVE
PH: 6.5 (ref 5.0–8.0)
RBC / HPF: NONE SEEN (ref 0–?)
SPECIFIC GRAVITY, URINE: 1.02 (ref 1.000–1.030)
Total Protein, Urine: NEGATIVE
Urine Glucose: NEGATIVE
Urobilinogen, UA: 0.2 (ref 0.0–1.0)

## 2018-04-13 LAB — CBC WITH DIFFERENTIAL/PLATELET
BASOS PCT: 1.1 % (ref 0.0–3.0)
Basophils Absolute: 0.1 10*3/uL (ref 0.0–0.1)
EOS PCT: 4.9 % (ref 0.0–5.0)
Eosinophils Absolute: 0.4 10*3/uL (ref 0.0–0.7)
HEMATOCRIT: 39.1 % (ref 36.0–46.0)
Hemoglobin: 13.2 g/dL (ref 12.0–15.0)
LYMPHS PCT: 27.5 % (ref 12.0–46.0)
Lymphs Abs: 2.1 10*3/uL (ref 0.7–4.0)
MCHC: 33.8 g/dL (ref 30.0–36.0)
MCV: 88.5 fl (ref 78.0–100.0)
MONO ABS: 0.8 10*3/uL (ref 0.1–1.0)
Monocytes Relative: 10.6 % (ref 3.0–12.0)
NEUTROS ABS: 4.3 10*3/uL (ref 1.4–7.7)
Neutrophils Relative %: 55.9 % (ref 43.0–77.0)
PLATELETS: 317 10*3/uL (ref 150.0–400.0)
RBC: 4.42 Mil/uL (ref 3.87–5.11)
RDW: 13.9 % (ref 11.5–15.5)
WBC: 7.6 10*3/uL (ref 4.0–10.5)

## 2018-04-13 LAB — LIPID PANEL
Cholesterol: 198 mg/dL (ref 0–200)
HDL: 57.7 mg/dL
LDL Cholesterol: 102 mg/dL — ABNORMAL HIGH (ref 0–99)
NonHDL: 140.15
Total CHOL/HDL Ratio: 3
Triglycerides: 189 mg/dL — ABNORMAL HIGH (ref 0.0–149.0)
VLDL: 37.8 mg/dL (ref 0.0–40.0)

## 2018-04-13 LAB — HEPATIC FUNCTION PANEL
ALT: 16 U/L (ref 0–35)
AST: 14 U/L (ref 0–37)
Albumin: 4 g/dL (ref 3.5–5.2)
Alkaline Phosphatase: 86 U/L (ref 39–117)
Bilirubin, Direct: 0.1 mg/dL (ref 0.0–0.3)
Total Bilirubin: 0.4 mg/dL (ref 0.2–1.2)
Total Protein: 7 g/dL (ref 6.0–8.3)

## 2018-04-13 LAB — TSH: TSH: 0.67 u[IU]/mL (ref 0.35–4.50)

## 2018-04-13 LAB — HEMOGLOBIN A1C: Hgb A1c MFr Bld: 6.6 % — ABNORMAL HIGH (ref 4.6–6.5)

## 2018-04-13 MED ORDER — TRIAMCINOLONE ACETONIDE 0.5 % EX OINT: 1.0000 "application " | TOPICAL_OINTMENT | Freq: Two times a day (BID) | CUTANEOUS | 1 refills | Status: DC | PRN

## 2018-04-13 NOTE — Assessment & Plan Note (Signed)
stable overall by history and exam, recent data reviewed with pt, and pt to continue medical treatment as before,  to f/u any worsening symptoms or concerns  

## 2018-04-13 NOTE — Patient Instructions (Addendum)
You had the Tdap tetanus shot today  Please take all new medication as prescribed - the steroid cream (to [facility])  Please continue all other medications as before, and refills have been done if requested.  Please have the pharmacy call with any other refills you may need.  Please continue your efforts at being more active, low cholesterol diet, and weight control.  You are otherwise up to date with prevention measures today.  Please keep your appointments with your specialists as you may have planned  Please go to the LAB in the Basement (turn left off the elevator) for the tests to be done today  You will be contacted by phone if any changes need to be made immediately.  Otherwise, you will receive a letter about your results with an explanation, but please check with MyChart first.  Please remember to sign up for MyChart if you have not done so, as this will be important to you in the future with finding out test results, communicating by private email, and scheduling acute appointments online when needed.  Please return in 1 year for your yearly visit, or sooner if needed, with Lab testing done 3-5 days before

## 2018-04-13 NOTE — Assessment & Plan Note (Signed)

## 2018-04-13 NOTE — Addendum Note (Signed)
Addended by: Cresenciano Lick on: 04/13/2018 09:46 AM   Modules accepted: Orders

## 2018-04-13 NOTE — Progress Notes (Signed)
Subjective:    Patient ID: [name], female    DOB: 09-01-1946, 72 y.o.   MRN: [id number]  HPI Here for wellness and f/u;  Overall doing ok;  Pt denies Chest pain, worsening SOB, DOE, wheezing, orthopnea, PND, worsening LE edema, palpitations, dizziness or syncope.  Pt denies neurological change such as new headache, facial or extremity weakness.  Pt denies polydipsia, polyuria, or low sugar symptoms. Pt states overall good compliance with treatment and medications, good tolerability, and has been trying to follow appropriate diet.  Pt denies worsening depressive symptoms, suicidal ideation or panic. No fever, night sweats, wt loss, loss of appetite, or other constitutional symptoms.  Pt states good ability with ADL's, has low fall risk, home safety reviewed and adequate, no other significant changes in hearing or vision, and only occasionally active with exercise. Goes to the gym seomtimes up to 4 times per wk.   Fell on carpet with trip and fall.  Pt continues to have recurring LBP without change in severity, bowel or bladder change, fever, wt loss,  worsening LE pain/numbness/weakness, gait change or other falls and bilat knee pain as well  Sees Dr Tamala Julian and ortho for back and knees.  . C/o itchy areas to right face and left arm at the elbow and just distal, not better with moisturizing cream, but topical steroid helped before Past Medical History:  Diagnosis Date  . Allergic rhinitis, cause unspecified   . Allergy    SEASONAL  . Anemia   . Chronic headaches   . Diverticulosis   . DJD (degenerative joint disease), lumbar   . GERD (gastroesophageal reflux disease)   . Hemorrhoids   . Hiatal hernia   . HTN (hypertension)   . Hyperlipidemia   . Obesity   . Osteoarthritis   . Tubular adenoma of colon    Past Surgical History:  Procedure Laterality Date  . ABLATION    . COLONOSCOPY    . TUBAL LIGATION      reports that she has never smoked. She has never used smokeless  tobacco. She reports that she does not drink alcohol or use drugs. family history includes Breast cancer in her sister; Diabetes in her brother; Heart disease in her mother; Hypertension in her mother. Allergies  Allergen Reactions  . 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 Sulfonamide Derivatives Itching and Rash   Current Outpatient Medications on File Prior to Visit  Medication Sig Dispense Refill  . atorvastatin (LIPITOR) 20 MG tablet TAKE 1 TABLET EVERY DAY 90 tablet 1  . azelastine (ASTELIN) 0.1 % nasal spray instill 1 to 2 sprays into each nostril at bedtime 30 mL 3  . Black Cohosh 80 MG CAPS Take by mouth.    . cetirizine (ZYRTEC) 10 MG tablet Take 1 tablet (10 mg total) by mouth daily. As needed 90 tablet 3  . Cholecalciferol (VITAMIN D3) 2000 UNITS capsule Take 2,000 Units by mouth daily.    . clotrimazole-betamethasone (LOTRISONE) cream APPLY TO TO THE AFFECTED AREA TWICE DAILY AS DIRECTED AS NEEDED 15 g 0  . diltiazem (TIAZAC) 120 MG 24 hr capsule TAKE 1 CAPSULE EVERY DAY 90 capsule 1  . DULoxetine (CYMBALTA) 20 MG capsule TAKE 2 CAPSULES BY MOUTH ONCE DAILY 180 capsule 1  . esomeprazole (NEXIUM) 40 MG capsule Take 1 capsule (40 mg total) by mouth daily. 30 capsule 10  . fluticasone (FLONASE) 50  MCG/ACT nasal spray Place 2 sprays into both nostrils daily. 16 g 0  . guaiFENesin (MUCINEX) 600 MG 12 hr tablet Take 1 tablet (600 mg total) by mouth 2 (two) times daily as needed for cough or to loosen phlegm. 14 tablet 0  . losartan-hydrochlorothiazide (HYZAAR) 100-25 MG tablet TAKE 1 TABLET EVERY DAY 90 tablet 0  . metoprolol tartrate (LOPRESSOR) 50 MG tablet TAKE 1 TABLET TWICE DAILY 180 tablet 3  . ranitidine (ZANTAC) 150 MG tablet Take 1 tablet every evening as needed 30 tablet 6  . sucralfate (CARAFATE) 1 G tablet Take 1 g by mouth as needed. Reported on 12/20/2015    . tiZANidine (ZANAFLEX) 4 MG  capsule Take 8 mg by mouth daily.    . traMADol (ULTRAM) 50 MG tablet Take 1 tablet by mouth every 6 (six) hours as needed.  0  . triamcinolone (NASACORT AQ) 55 MCG/ACT AERO nasal inhaler Place 2 sprays into the nose daily. 1 Inhaler 3  . Turmeric 500 MG CAPS Take 1 capsule by mouth daily.     No current facility-administered medications on file prior to visit.    Review of Systems Constitutional: Negative for other unusual diaphoresis, sweats, appetite or weight changes HENT: Negative for other worsening hearing loss, ear pain, facial swelling, mouth sores or neck stiffness.   Eyes: Negative for other worsening pain, redness or other visual disturbance.  Respiratory: Negative for other stridor or swelling Cardiovascular: Negative for other palpitations or other chest pain  Gastrointestinal: Negative for worsening diarrhea or loose stools, blood in stool, distention or other pain Genitourinary: Negative for hematuria, flank pain or other change in urine volume.  Musculoskeletal: Negative for myalgias or other joint swelling.  Skin: Negative for other color change, or other wound or worsening drainage.  Neurological: Negative for other syncope or numbness. Hematological: Negative for other adenopathy or swelling Psychiatric/Behavioral: Negative for hallucinations, other worsening agitation, SI, self-injury, or new decreased concentration All other system neg per pt    Objective:   Physical Exam BP 130/80 (BP Location: Left Arm, Patient Position: Sitting, Cuff Size: Large)   Pulse 73   Ht 5' 0.25" (1.53 m)   Wt 214 lb (97.1 kg)   SpO2 96%   BMI 41.45 kg/m  VS noted,  Constitutional: Pt is oriented to person, place, and time. Appears well-developed and well-nourished, in no significant distress and comfortable Head: Normocephalic and atraumatic  Eyes: Conjunctivae and EOM are normal. Pupils are equal, round, and reactive to light Right Ear: External ear normal without discharge Left  Ear: External ear normal without discharge Nose: Nose without discharge or deformity Mouth/Throat: Oropharynx is without other ulcerations and moist  Neck: Normal range of motion. Neck supple. No JVD present. No tracheal deviation present or significant neck LA or mass Cardiovascular: Normal rate, regular rhythm, normal heart sounds and intact distal pulses Pulmonary/Chest: WOB normal and breath sounds without rales or wheezing  Abdominal: Soft. Bowel sounds are normal. NT. No HSM  Musculoskeletal: Normal range of motion. Exhibits no edema Lymphadenopathy: Has no other cervical adenopathy.  Neurological: Pt is alert and oriented to person, place, and time. Pt has normal reflexes. No cranial nerve deficit. Motor grossly intact, Gait intact Skin: Skin is warm and dry. No rash noted or new ulcerations except for mild nontender erythema with slight swelling to the right max sinus area only Psychiatric:  Has normal mood and affect. Behavior is normal without agitation No other exam findings Lab Results  Component Value Date  WBC 12.7 (H) 04/07/2017   HGB 13.9 04/07/2017   HCT 41.4 04/07/2017   PLT 319.0 04/07/2017   GLUCOSE 99 04/07/2017   CHOL 188 04/07/2017   TRIG 138.0 04/07/2017   HDL 58.60 04/07/2017   LDLDIRECT 168.8 01/20/2012   LDLCALC 101 (H) 04/07/2017   ALT 16 04/07/2017   AST 11 04/07/2017   NA 140 04/07/2017   K 3.6 04/07/2017   CL 101 04/07/2017   CREATININE 0.80 04/07/2017   BUN 20 04/07/2017   CO2 30 04/07/2017   TSH 0.82 04/07/2017       Assessment & Plan:

## 2018-04-26 NOTE — Progress Notes (Signed)
[name] [address], [place] [address] Phone: [phone] Subjective:    I'm seeing this patient by the request  of:    CC: Bilateral knee pain  MVE:[id number]  [name] is a 72 y.o. female coming in with complaint of bilateral knee pain. She has pain with deep knee bending and weight bearing but also notes that she does have pain with lying down.  Patient has severe arthritis in the knees.  Last injection 9 weeks ago.  Has done Visco supplementation with mixed benefits over the course of time  She also complains of posterior, right shoulder pain that is achy in character. Pain is chronic and does radiate into the tricep. Pain is intermittent. Patient does ambulate with a cane using that side.  3 out of 10 in severity but just wanted to discuss.       Past Medical History:  Diagnosis Date  . Allergic rhinitis, cause unspecified   . Allergy    SEASONAL  . Anemia   . Chronic headaches   . Diverticulosis   . DJD (degenerative joint disease), lumbar   . GERD (gastroesophageal reflux disease)   . Hemorrhoids   . Hiatal hernia   . HTN (hypertension)   . Hyperlipidemia   . Obesity   . Osteoarthritis   . Tubular adenoma of colon    Past Surgical History:  Procedure Laterality Date  . ABLATION    . COLONOSCOPY    . TUBAL LIGATION     Social History   Socioeconomic History  . Marital status: Widowed    Spouse name: Not on file  . Number of children: 3  . Years of education: 11  . Highest education level: Not on file  Occupational History  . Occupation: retired    Fish farm manager: UNEMPLOYED  Social Needs  . Financial resource strain: Not on file  . Food insecurity:    Worry: Not on file    Inability: Not on file  . Transportation needs:    Medical: Not on file    Non-medical: Not on file  Tobacco Use  . Smoking status: Never Smoker  . Smokeless tobacco: Never Used  Substance and Sexual Activity  . Alcohol use: No      Alcohol/week: 0.0 oz  . Drug use: No  . Sexual activity: Never  Lifestyle  . Physical activity:    Days per week: Not on file    Minutes per session: Not on file  . Stress: Not on file  Relationships  . Social connections:    Talks on phone: Not on file    Gets together: Not on file    Attends religious service: Not on file    Active member of club or organization: Not on file    Attends meetings of clubs or organizations: Not on file    Relationship status: Not on file  Other Topics Concern  . Not on file  Social History Narrative  . Not on file   Allergies  Allergen Reactions  . 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 Sulfonamide Derivatives Itching and Rash   Family History  Problem Relation Age of Onset  . Heart disease Mother   . Hypertension Mother   . Diabetes Brother   . Breast cancer Sister   . Colon cancer Neg Hx   . Esophageal cancer Neg Hx   .  Rectal cancer Neg Hx   . Stomach cancer Neg Hx      Past medical history, social, surgical and family history all reviewed in electronic medical record.  No pertanent information unless stated regarding to the chief complaint.   Review of Systems:Review of systems updated and as accurate as of 04/27/18  No headache, visual changes, nausea, vomiting, diarrhea, constipation, dizziness, abdominal pain, skin rash, fevers, chills, night sweats, weight loss, swollen lymph nodes, body aches, joint swelling,  chest pain, shortness of breath, mood changes.  Positive muscle aches  Objective  Blood pressure 138/72, pulse 80, height 5' (1.524 m), weight 213 lb (96.6 kg), SpO2 95 %. Systems examined below as of 04/27/18   General: No apparent distress alert and oriented x3 mood and affect normal, dressed appropriately.  HEENT: Pupils equal, extraocular movements intact  Respiratory: Patient's speak in full sentences and does not appear short of  breath  Cardiovascular: No lower extremity edema, non tender, no erythema  Skin: Warm dry intact with no signs of infection or rash on extremities or on axial skeleton.  Abdomen: Soft nontender  Neuro: Cranial nerves II through XII are intact, neurovascularly intact in all extremities with 2+ DTRs and 2+ pulses.  Lymph: No lymphadenopathy of posterior or anterior cervical chain or axillae bilaterally.  Gait antalgic gait using the aid of a cane MSK:  Non tender with full range of motion and good stability and symmetric strength and tone of elbows, wrist, hip, and ankles bilaterally.   Knee: Bilateral valgus deformity noted. Large thigh to calf ratio.  Tender to palpation over medial and PF joint line.  ROM full in flexion and extension and lower leg rotation. instability with valgus force.  painful patellar compression. Patellar glide with moderate crepitus. Patellar and quadriceps tendons unremarkable. Hamstring and quadriceps strength is normal.  Right shoulder exam shows positive impingement with Neer's and Hawkins.  5 out of 5 strength of the rotator cuff near full range of motion.  After informed written and verbal consent, patient was seated on exam table. Right knee was prepped with alcohol swab and utilizing anterolateral approach, patient's right knee space was injected with 4:1  marcaine 0.5%: Kenalog 40mg /dL. Patient tolerated the procedure well without immediate complications.  After informed written and verbal consent, patient was seated on exam table. Left knee was prepped with alcohol swab and utilizing anterolateral approach, patient's left knee space was injected with 4:1  marcaine 0.5%: Kenalog 40mg /dL. Patient tolerated the procedure well without immediate complications.   Impression and Recommendations:     This case required medical decision making of moderate complexity.      Note: This dictation was prepared with Dragon dictation along with smaller phrase  technology. Any transcriptional errors that result from this process are unintentional.

## 2018-04-27 ENCOUNTER — Encounter: Payer: Self-pay | Admitting: Family Medicine

## 2018-04-27 ENCOUNTER — Ambulatory Visit (INDEPENDENT_AMBULATORY_CARE_PROVIDER_SITE_OTHER): Payer: Medicare HMO | Admitting: Family Medicine

## 2018-04-27 DIAGNOSIS — M7551 Bursitis of right shoulder: Secondary | ICD-10-CM | POA: Diagnosis not present

## 2018-04-27 DIAGNOSIS — M170 Bilateral primary osteoarthritis of knee: Secondary | ICD-10-CM

## 2018-04-27 NOTE — Patient Instructions (Signed)
Good to see you  [name] is your friend.  Lets wait on the shoulder and see how it goes.  If not better see me again in 3-4 weeks and we will inject

## 2018-04-27 NOTE — Assessment & Plan Note (Signed)
History of bursitis and seems to be having an acute flare.  Encourage patient to do the exercises, home exercises likely could be helpful.  If not then will consider injection at follow-up

## 2018-04-27 NOTE — Assessment & Plan Note (Signed)
Bilateral injections given again today.  We discussed the possibility of Visco supplementation and patient will consider it.  We discussed icing regimen, home exercises, custom bracing.  Patient does not want to do physical therapy again.  Follow-up again in 4 to 8 weeks for the shoulder

## 2018-05-02 DIAGNOSIS — M48062 Spinal stenosis, lumbar region with neurogenic claudication: Secondary | ICD-10-CM | POA: Diagnosis not present

## 2018-05-02 DIAGNOSIS — I10 Essential (primary) hypertension: Secondary | ICD-10-CM | POA: Diagnosis not present

## 2018-05-02 DIAGNOSIS — Z6837 Body mass index (BMI) 37.0-37.9, adult: Secondary | ICD-10-CM | POA: Diagnosis not present

## 2018-05-09 ENCOUNTER — Ambulatory Visit: Payer: Medicare HMO | Admitting: Family Medicine

## 2018-05-16 ENCOUNTER — Ambulatory Visit: Payer: Medicare HMO | Admitting: Internal Medicine

## 2018-05-16 ENCOUNTER — Encounter: Payer: Self-pay | Admitting: Internal Medicine

## 2018-05-16 DIAGNOSIS — J302 Other seasonal allergic rhinitis: Secondary | ICD-10-CM | POA: Diagnosis not present

## 2018-05-16 DIAGNOSIS — J3089 Other allergic rhinitis: Secondary | ICD-10-CM

## 2018-05-16 DIAGNOSIS — H6982 Other specified disorders of Eustachian tube, left ear: Secondary | ICD-10-CM | POA: Diagnosis not present

## 2018-05-16 MED ORDER — AZELASTINE HCL 0.1 % NA SOLN: NASAL | 12 refills | Status: DC

## 2018-05-16 NOTE — Assessment & Plan Note (Signed)
Some rhinitis with eustachian dysfunction but not bad.  She feels resuming Astelin nasal spray should take care of this.  I do not think she has an infection now but if that changes she understands to contact us. Plan-refill Astelin and can combine with Flonase if needed.

## 2018-05-16 NOTE — Progress Notes (Signed)
HPI female never smoker followed for allergic rhinitis, recurrent sinusitis, food allergy Allergy Profile 09/18/2013-total IgE 48. Broadly positive for common allergens including aspergillus. Food profile positive for peanut, soybean. She reports banana and melons make her throat itch.  -------------------------------------------------------------------------.  11/16/17- 72 year old female never smoker followed for Allergic rhinitis, recurrent sinusitis, food allergy ---- Pt states she is doing well overall but notes increase in congestion in the summer time. Nasacort or Flonase, Mucinex, Astelin, Zyrtec Quite comfortable currently.  Warmer months are when she has more nasal congestion.  Denies chest congestion, cough or wheeze.  No recent sinus infection or acute issue.  We reviewed her medications including steroid nasal spray, Astelin, Mucinex, Zyrtec.  No significant headache, no purulent discharge or bleeding. CXR 07/26/17 IMPRESSION: No active cardiopulmonary disease.  05/16/2018- 72 year old female never smoker followed for Allergic rhinitis, recurrent sinusitis, food allergy -----Sinusitis and Bronchitis: Pt states she has nasal drainage-when bring up from chest its yellow in color. Sinus headaches as well; stopped up nose at night. Denies any wheezing, cough, fever, or chills.  Typical summer for her-stuffy nose with some postnasal drip and stuffy ears.  Occasional cough, not daily.  No fever or sore throat and she does not feel she has an infection. CXR 07/26/2017- IMPRESSION: No active cardiopulmonary disease.  ROS-see HPI   + = positive Constitutional:   No-   weight loss, night sweats, fevers, chills, fatigue, lassitude. HEENT:   + headaches, no-difficulty swallowing, tooth/dental problems, sore throat,       No-  sneezing, itching, +ear ache, +nasal congestion, post nasal drip,  CV:  No-   chest pain, orthopnea, PND, swelling in lower extremities, anasarca, dizziness,  palpitations Resp: No-   shortness of breath with exertion or at rest.              No-productive cough, +non-productive cough,  No- coughing up of blood.              No-   change in color of mucus.  No- wheezing.   Skin: No-   rash or lesions. GI:  No-   heartburn, indigestion, abdominal pain, nausea, vomiting,  GU: . MS:  No-   joint pain or swelling.   Neuro-     nothing unusual Psych:  No- change in mood or affect. No depression or anxiety.  No memory loss.  OBJ- Physical Exam General- Alert, Oriented, Affect-appropriate, Distress- none acute. + Obese Skin- rash-none, lesions- none, excoriation- none Lymphadenopathy- none Head- atraumatic            Eyes- Gross vision intact, PERRLA, conjunctivae and secretions clear            Ears- Hearing, canals-normal            Nose- clear, no-Septal dev, mucus, polyps, erosion, perforation, looks healthy            Throat- Mallampati III , mucosa clear , drainage- none, tonsils- atrophic Neck- flexible , trachea midline, no stridor , thyroid nl, carotid no bruit Chest - symmetrical excursion , unlabored           Heart/CV- RRR , no murmur , no gallop  , no rub, nl s1 s2                           - JVD- none , edema- none, stasis changes- none, varices- none           Lung- clear to P&A,  wheeze- none, cough+ dry , dullness-none, rub- none           Chest wall-  Abd- Br/ Gen/ Rectal- Not done, not indicated Extrem- cyanosis- none, clubbing, none, atrophy- none, strength- nl Neuro- grossly intact to observation

## 2018-05-16 NOTE — Assessment & Plan Note (Signed)
Discussed use of nasal sprays and Sudafed-PE.  Consider true Sudafed later if needed.

## 2018-05-16 NOTE — Patient Instructions (Signed)
Script sent refilling the Astelin nasal spray to help with drainage.  You can combine this with Flonase/ fluticasone nasal spray if needed.  You can try otc Sudafed-PE to help head congestion and stuffiness.  Please let us know if you don't get better.

## 2018-05-23 NOTE — Progress Notes (Addendum)
Subjective:   [name] is a 72 y.o. female who presents for Medicare Annual (Subsequent) preventive examination.  Review of Systems:  No ROS.  Medicare Wellness Visit. Additional risk factors are reflected in the social history.  Cardiac Risk Factors include: advanced age (>64men, >24 women);hypertension;obesity (BMI >30kg/m2) Sleep patterns: feels rested on waking, gets up 1 times nightly to void and sleeps 7-8 hours nightly.    Home Safety/Smoke Alarms: Feels safe in home. Smoke alarms in place.  Living environment; residence and Firearm Safety: 1-story house/ trailer, equipment: Radio producer, Type: [place], no firearms. Lives with son, good support system Seat Belt Safety/Bike Helmet: Wears seat belt.      Objective:     Vitals: BP 140/70   Pulse 67   Ht 5\' 3"  (1.6 m)   Wt 211 lb (95.7 kg)   SpO2 97%   BMI 37.38 kg/m   Body mass index is 37.38 kg/m.  Advanced Directives 05/24/2018 02/16/2017 12/20/2015  Does Patient Have a Medical Advance Directive? No No No  Would patient like information on creating a medical advance directive? Yes (ED - Information included in AVS) Yes (ED - Information included in AVS) Yes - Educational materials given    Tobacco Social History   Tobacco Use  Smoking Status Never Smoker  Smokeless Tobacco Never Used     Counseling given: Not Answered  Past Medical History:  Diagnosis Date  . Allergic rhinitis, cause unspecified   . Allergy    SEASONAL  . Anemia   . Chronic headaches   . Diverticulosis   . DJD (degenerative joint disease), lumbar   . GERD (gastroesophageal reflux disease)   . Hemorrhoids   . Hiatal hernia   . HTN (hypertension)   . Hyperlipidemia   . Obesity   . Osteoarthritis   . Tubular adenoma of colon    Past Surgical History:  Procedure Laterality Date  . ABLATION    . COLONOSCOPY    . TUBAL LIGATION     Family History  Problem Relation Age of Onset  . Heart disease Mother   . Hypertension  Mother   . Diabetes Brother   . Breast cancer Sister   . Colon cancer Neg Hx   . Esophageal cancer Neg Hx   . Rectal cancer Neg Hx   . Stomach cancer Neg Hx    Social History   Socioeconomic History  . Marital status: Widowed    Spouse name: Not on file  . Number of children: 3  . Years of education: 69  . Highest education level: Not on file  Occupational History  . Occupation: retired    Fish farm manager: UNEMPLOYED  Social Needs  . Financial resource strain: Not hard at all  . Food insecurity:    Worry: Never true    Inability: Never true  . Transportation needs:    Medical: No    Non-medical: No  Tobacco Use  . Smoking status: Never Smoker  . Smokeless tobacco: Never Used  Substance and Sexual Activity  . Alcohol use: No    Alcohol/week: 0.0 oz  . Drug use: No  . Sexual activity: Never  Lifestyle  . Physical activity:    Days per week: 3 days    Minutes per session: 50 min  . Stress: Not at all  Relationships  . Social connections:    Talks on phone: More than three times a week    Gets together: More than three times a week  Attends religious service: More than 4 times per year    Active member of club or organization: Yes    Attends meetings of clubs or organizations: More than 4 times per year    Relationship status: Widowed  Other Topics Concern  . Not on file  Social History Narrative  . Not on file    Outpatient Encounter Medications as of 05/24/2018  Medication Sig  . atorvastatin (LIPITOR) 20 MG tablet TAKE 1 TABLET EVERY DAY  . azelastine (ASTELIN) 0.1 % nasal spray 1 puff each nostril twice daily as needed  . Black Cohosh 80 MG CAPS Take by mouth.  . cetirizine (ZYRTEC) 10 MG tablet Take 1 tablet (10 mg total) by mouth daily. As needed  . Cholecalciferol (VITAMIN D3) 2000 UNITS capsule Take 2,000 Units by mouth daily.  Marland Kitchen diltiazem (TIAZAC) 120 MG 24 hr capsule TAKE 1 CAPSULE EVERY DAY  . DULoxetine (CYMBALTA) 20 MG capsule TAKE 2 CAPSULES BY MOUTH  ONCE DAILY  . esomeprazole (NEXIUM) 40 MG capsule Take 1 capsule (40 mg total) by mouth daily.  . fluticasone (FLONASE) 50 MCG/ACT nasal spray Place 2 sprays into both nostrils daily.  Marland Kitchen guaiFENesin (MUCINEX) 600 MG 12 hr tablet Take 1 tablet (600 mg total) by mouth 2 (two) times daily as needed for cough or to loosen phlegm.  Marland Kitchen losartan-hydrochlorothiazide (HYZAAR) 100-25 MG tablet TAKE 1 TABLET EVERY DAY  . metoprolol tartrate (LOPRESSOR) 50 MG tablet TAKE 1 TABLET TWICE DAILY  . ranitidine (ZANTAC) 150 MG tablet Take 1 tablet every evening as needed  . sucralfate (CARAFATE) 1 G tablet Take 1 g by mouth as needed. Reported on 12/20/2015  . tiZANidine (ZANAFLEX) 4 MG capsule Take 8 mg by mouth daily.  . traMADol (ULTRAM) 50 MG tablet Take 1 tablet by mouth every 6 (six) hours as needed.  . triamcinolone (NASACORT AQ) 55 MCG/ACT AERO nasal inhaler Place 2 sprays into the nose daily.  Marland Kitchen triamcinolone ointment (KENALOG) 0.5 % Apply 1 application topically 2 (two) times daily as needed.  . Turmeric 500 MG CAPS Take 1 capsule by mouth daily.   No facility-administered encounter medications on file as of 05/24/2018.     Activities of Daily Living In your present state of health, do you have any difficulty performing the following activities: 05/24/2018  Hearing? N  Vision? N  Difficulty concentrating or making decisions? N  Walking or climbing stairs? Y  Dressing or bathing? Y  Doing errands, shopping? N  Preparing Food and eating ? N  Using the Toilet? N  In the past six months, have you accidently leaked urine? N  Do you have problems with loss of bowel control? N  Managing your Medications? N  Managing your Finances? N  Housekeeping or managing your Housekeeping? N  Some recent data might be hidden    Patient Care Team: Biagio Borg, MD as PCP - General (Internal Medicine)    Assessment:   This is a routine wellness examination for [place]. Physical assessment deferred to  PCP.   Exercise Activities and Dietary recommendations Current Exercise Habits: Structured exercise class, Time (Minutes): 45, Frequency (Times/Week): 3, Weekly Exercise (Minutes/Week): 135, Intensity: Mild, Exercise limited by: orthopedic condition(s) Diet (meal preparation, eat out, water intake, caffeinated beverages, dairy products, fruits and vegetables): in general, a "healthy" diet  , well balanced   Reviewed heart healthy diet. Encouraged patient to increase daily water and healthy fluid intake.  Goals    . Patient Stated  Continue to eat healthy and do water aerobics at the [facility]. Enjoy life, church and family.    . Weight < 185 lb (83.915 kg)     Plan is to eat less "white" rice; pasta;  Given information on an 1800 calorie       Fall Risk Fall Risk  05/24/2018 04/13/2018 02/16/2017 03/31/2016 12/20/2015  Falls in the past year? Yes Yes Yes Yes No  Number falls in past yr: 1 1 2  or more 1 -  Injury with Fall? No No Yes Yes -  Comment - - - compression fx with fall about 2 wks ago after misstep -  Risk Factor Category  - - High Fall Risk - -  Risk for fall due to : Impaired mobility - Impaired balance/gait;Impaired mobility - -  Follow up Falls prevention discussed;Education provided - Falls prevention discussed;Education provided - -    Depression Screen PHQ 2/9 Scores 05/24/2018 04/13/2018 02/16/2017 03/31/2016  PHQ - 2 Score 0 1 1 0  PHQ- 9 Score 0 - - -     Cognitive Function MMSE - Mini Mental State Exam 05/24/2018 02/16/2017  Orientation to time 5 5  Orientation to Place 5 5  Registration 3 3  Attention/ Calculation 3 3  Recall 2 0  Language- name 2 objects 2 2  Language- repeat 1 1  Language- follow 3 step command 3 3  Language- read & follow direction 1 1  Write a sentence 1 1  Copy design 1 1  Total score 27 25        Immunization History  Administered Date(s) Administered  . Influenza Split 09/30/2012  . Influenza, High Dose Seasonal PF 08/01/2013,  08/11/2016, 07/26/2017  . Influenza,inj,Quad PF,6+ Mos 07/26/2015  . Pneumococcal Conjugate-13 03/15/2014  . Pneumococcal Polysaccharide-23 01/30/2013  . Tdap 01/18/1999, 04/13/2018   Screening Tests Health Maintenance  Topic Date Due  . INFLUENZA VACCINE  05/19/2018  . COLONOSCOPY  12/11/2019  . MAMMOGRAM  01/19/2020  . TETANUS/TDAP  04/13/2028  . DEXA SCAN  Completed  . Hepatitis C Screening  Completed  . PNA vac Low Risk Adult  Completed      Plan:    Tub bench ordered for patient's safety and to help maintain independence of ADLs.  Continue doing brain stimulating activities (puzzles, reading, adult coloring books, staying active) to keep memory sharp.   Continue to eat heart healthy diet (full of fruits, vegetables, whole grains, lean protein, water--limit salt, fat, and sugar intake) and increase physical activity as tolerated.   I have personally reviewed and noted the following in the patient's chart:   . Medical and social history . Use of alcohol, tobacco or illicit drugs  . Current medications and supplements . Functional ability and status . Nutritional status . Physical activity . Advanced directives . List of other physicians . Vitals . Screenings to include cognitive, depression, and falls . Referrals and appointments  In addition, I have reviewed and discussed with patient certain preventive protocols, quality metrics, and best practice recommendations. A written personalized care plan for preventive services as well as general preventive health recommendations were provided to patient.     Michiel Cowboy, RN  05/24/2018    Medical screening examination/treatment/procedure(s) were performed by non-physician practitioner and as supervising physician I was immediately available for consultation/collaboration. I agree with above. [name], MD

## 2018-05-23 NOTE — Progress Notes (Signed)
[name] [address], [place] [address] Phone: [phone] Subjective:     CC: Bilateral knee pain follow-up, right shoulder pain follow-up  [id number]  [name] is a 72 y.o. female coming in with complaint of bilateral knee pain. States her knees are sore today but she is doing better. Bilateral knee pain.  Significant arthritis of the knees.  Patient did have injections 1 month ago.  Doing relatively well.  Patient at last visit was having some difficulty with the right shoulder.  Has had history of a bursitis previously.  States that is even doing better at this time.  Nothing severe.      Past Medical History:  Diagnosis Date  . Allergic rhinitis, cause unspecified   . Allergy    SEASONAL  . Anemia   . Chronic headaches   . Diverticulosis   . DJD (degenerative joint disease), lumbar   . GERD (gastroesophageal reflux disease)   . Hemorrhoids   . Hiatal hernia   . HTN (hypertension)   . Hyperlipidemia   . Obesity   . Osteoarthritis   . Tubular adenoma of colon    Past Surgical History:  Procedure Laterality Date  . ABLATION    . COLONOSCOPY    . TUBAL LIGATION     Social History   Socioeconomic History  . Marital status: Widowed    Spouse name: Not on file  . Number of children: 3  . Years of education: 73  . Highest education level: Not on file  Occupational History  . Occupation: retired    Fish farm manager: UNEMPLOYED  Social Needs  . Financial resource strain: Not on file  . Food insecurity:    Worry: Not on file    Inability: Not on file  . Transportation needs:    Medical: Not on file    Non-medical: Not on file  Tobacco Use  . Smoking status: Never Smoker  . Smokeless tobacco: Never Used  Substance and Sexual Activity  . Alcohol use: No    Alcohol/week: 0.0 oz  . Drug use: No  . Sexual activity: Never  Lifestyle  . Physical activity:    Days per week: Not on file    Minutes per session:  Not on file  . Stress: Not on file  Relationships  . Social connections:    Talks on phone: Not on file    Gets together: Not on file    Attends religious service: Not on file    Active member of club or organization: Not on file    Attends meetings of clubs or organizations: Not on file    Relationship status: Not on file  Other Topics Concern  . Not on file  Social History Narrative  . Not on file   Allergies  Allergen Reactions  . 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 Sulfonamide Derivatives Itching and Rash   Family History  Problem Relation Age of Onset  . Heart disease Mother   . Hypertension Mother   . Diabetes Brother   . Breast cancer Sister   . Colon cancer Neg Hx   . Esophageal cancer Neg Hx   . Rectal cancer Neg Hx   . Stomach cancer Neg Hx      Past medical history, social, surgical and family history all reviewed in electronic medical record.  No pertanent information unless stated regarding  to the chief complaint.   Review of Systems:Review of systems updated and as accurate as of 05/24/18  No headache, visual changes, nausea, vomiting, diarrhea, constipation, dizziness, abdominal pain, skin rash, fevers, chills, night sweats, weight loss, swollen lymph nodes, body aches, joint swelling, muscle aches, chest pain, shortness of breath, mood changes.   Objective  Blood pressure 140/70, pulse 67, height 5\' 3"  (1.6 m), weight 211 lb (95.7 kg), SpO2 97 %. Systems examined below as of 05/24/18   General: No apparent distress alert and oriented x3 mood and affect normal, dressed appropriately.  HEENT: Pupils equal, extraocular movements intact  Respiratory: Patient's speak in full sentences and does not appear short of breath  Cardiovascular: Trace lower extremity edema, non tender, no erythema  Skin: Warm dry intact with no signs of infection or rash on extremities or on axial  skeleton.  Abdomen: Soft nontender  Neuro: Cranial nerves II through XII are intact, neurovascularly intact in all extremities with 2+ DTRs and 2+ pulses.  Lymph: No lymphadenopathy of posterior or anterior cervical chain or axillae bilaterally.  Gait normal with good balance and coordination.  MSK:  Non tender with full range of motion and good stability and symmetric strength and tone of  elbows, wrist, hip, and ankles bilaterally.  Right shoulder exam shows the patient has 5 out of 5 strength.  Mild impingement still noted with Neer and Hawkins.  Near full range of motion.  Knee: Bilateral valgus deformity noted. Large thigh to calf ratio.  Tender to palpation over medial and PF joint line.  ROM full in flexion and extension and lower leg rotation. instability with valgus force.  painful patellar compression. Patellar glide with moderate crepitus. Patellar and quadriceps tendons unremarkable. Hamstring and quadriceps strength is normal.     Impression and Recommendations:     This case required medical decision making of moderate complexity.      Note: This dictation was prepared with Dragon dictation along with smaller phrase technology. Any transcriptional errors that result from this process are unintentional.

## 2018-05-24 ENCOUNTER — Encounter: Payer: Self-pay | Admitting: Family Medicine

## 2018-05-24 ENCOUNTER — Ambulatory Visit (INDEPENDENT_AMBULATORY_CARE_PROVIDER_SITE_OTHER): Payer: Medicare HMO | Admitting: *Deleted

## 2018-05-24 ENCOUNTER — Ambulatory Visit (INDEPENDENT_AMBULATORY_CARE_PROVIDER_SITE_OTHER): Payer: Medicare HMO | Admitting: Family Medicine

## 2018-05-24 VITALS — BP 140/70 | HR 67 | Ht 63.0 in | Wt 211.0 lb

## 2018-05-24 DIAGNOSIS — M170 Bilateral primary osteoarthritis of knee: Secondary | ICD-10-CM | POA: Diagnosis not present

## 2018-05-24 DIAGNOSIS — Z9181 History of falling: Secondary | ICD-10-CM | POA: Diagnosis not present

## 2018-05-24 DIAGNOSIS — Z0000 Encounter for general adult medical examination without abnormal findings: Secondary | ICD-10-CM

## 2018-05-24 DIAGNOSIS — M7551 Bursitis of right shoulder: Secondary | ICD-10-CM | POA: Diagnosis not present

## 2018-05-24 NOTE — Assessment & Plan Note (Signed)
Stable.  No change in management.  Repeat injections every 3 months as long as if it continues to improve

## 2018-05-24 NOTE — Patient Instructions (Addendum)
Good to see you  [name] is your friend.  Exercises 3 times a week.  Keep hands within peripheral vision  pennsaid pinkie amount topically 2 times daily as needed.   See me again when you need me or again I n6-8 weeks for the knee.

## 2018-05-24 NOTE — Patient Instructions (Addendum)
[facility] would like to hear from you! Contact us if we can help you or your loved one, or if you would like to volunteer. For immediate assistance, phone the [facility].  Call us:  [place]  [phone]  The [facility] is staffed by compassionate and knowledgeable specialists who can not only provide information on resources available through a myriad of government and civic programs, but can also refer specific needs directly to volunteers across the state.  Call Center assistance is available:  Monday through Friday  9:00 a.m. - 5:00 p.m.  You can also reach [place] by mail:  [facility]  [address], [place]  Email [place]:  Use the contact form to message [place] and/or subscribe to our newsletter.  Please read our privacy policy.  Referrals are accepted by phone only. Please phone [phone] to speak with a Call Center Specialist.  Continue doing brain stimulating activities (puzzles, reading, adult coloring books, staying active) to keep memory sharp.   Continue to eat heart healthy diet (full of fruits, vegetables, whole grains, lean protein, water--limit salt, fat, and sugar intake) and increase physical activity as tolerated.   [name] , Thank you for taking time to come for your Medicare Wellness Visit. I appreciate your ongoing commitment to your health goals. Please review the following plan we discussed and let me know if I can assist you in the future.   These are the goals we discussed: Goals    . Patient Stated     Continue to eat healthy and do water aerobics at the [facility]. Enjoy life, church and family.    . Weight < 185 lb (83.915 kg)     Plan is to eat less "white" rice; pasta;  Given information on an 1800 calorie       This is a list of the screening recommended for you and due dates:  Health Maintenance  Topic Date Due  . Flu Shot  05/19/2018  . Colon Cancer Screening  12/11/2019  . Mammogram  01/19/2020  .  Tetanus Vaccine  04/13/2028  . DEXA scan (bone density measurement)  Completed  .  Hepatitis C: One time screening is recommended by Center for Disease Control  (CDC) for  adults born from [birth year] through 1964.   Completed  . Pneumonia vaccines  Completed   It is important to avoid accidents which may result in broken bones.  Here are a few ideas on how to make your home safer so you will be less likely to trip or fall.  1. Use nonskid mats or non slip strips in your shower or tub, on your bathroom floor and around sinks.  If you know that you have spilled water, wipe it up! 2. In the bathroom, it is important to have properly installed grab bars on the walls or on the edge of the tub.  Towel racks are NOT strong enough for you to hold onto or to pull on for support. 3. Stairs and hallways should have enough light.  Add lamps or night lights if you need ore light. 4. It is good to have handrails on both sides of the stairs if possible.  Always fix broken handrails right away. 5. It is important to see the edges of steps.  Paint the edges of outdoor steps white so you can see them better.  Put colored tape on the edge of inside steps. 6. Throw-rugs are dangerous because they can slide.  Removing the rugs is the best idea, but if they must stay, add adhesive carpet tape to prevent slipping. 7. Do not keep things on stairs or in the halls.  Remove small furniture that blocks the halls as it may cause you to trip.  Keep telephone and electrical cords out of the way where you walk. 8. Always were sturdy, rubber-soled shoes for good support.  Never wear just socks, especially on the stairs.  Socks may cause you to slip or fall.  Do not wear full-length housecoats as you can easily trip on the bottom.  9. Place the things you use the most on the shelves that are the easiest to reach.  If you use a stepstool, make sure it is in good condition.  If you feel unsteady, DO NOT climb, ask for help. If a health  professional advises you to use a cane or walker, do not be ashamed.  These items can keep you from falling and breaking your bones.Health Maintenance, Female Adopting a healthy lifestyle and getting preventive care can go a long way to promote health and wellness. Talk with your health care provider about what schedule of regular examinations is right for you. This is a good chance for you to check in with your provider about disease prevention and staying healthy. In between checkups, there are plenty of things you can do on your own. Experts have done a lot of research about which lifestyle changes and preventive measures are most likely to keep you healthy. Ask your health care provider for more information. Weight and diet Eat a healthy diet  Be sure to include plenty of vegetables, fruits, low-fat dairy products, and lean protein.  Do not eat a lot of foods high in solid fats, added sugars, or salt.  Get regular exercise. This is one of the most important things you can do for your health. ? Most adults should exercise for at least 150 minutes each week. The exercise should increase your heart rate and make you sweat (moderate-intensity exercise). ? Most adults should also do strengthening exercises at least twice a week. This is in addition to the moderate-intensity exercise.  Maintain a healthy weight  Body mass index (BMI) is a measurement that can be used to identify possible weight problems. It estimates body fat based on height and weight. Your health care provider can help determine your BMI and help you achieve or maintain a healthy weight.  For females 61 years of age and older: ? A BMI below 18.5 is considered underweight. ? A BMI of 18.5 to 24.9 is normal. ? A BMI of 25 to 29.9 is considered overweight. ? A BMI of 30 and above is considered obese.  Watch levels of cholesterol and blood lipids  You should start having your blood tested for lipids and cholesterol at 71 years  of age, then have this test every 5 years.  You may need to have your cholesterol levels checked more often if: ? Your lipid or cholesterol levels are high. ? You are older than 71 years of age. ? You are at high risk for heart disease.  Cancer screening Lung Cancer  Lung cancer screening is recommended for adults 88-52 years old who are at high risk for lung cancer because of a history of smoking.  A yearly low-dose CT scan of the lungs is recommended for people who: ? Currently smoke. ? Have quit within the past 15 years. ? Have at least a 30-pack-year history of  smoking. A pack year is smoking an average of one pack of cigarettes a day for 1 year.  Yearly screening should continue until it has been 15 years since you quit.  Yearly screening should stop if you develop a health problem that would prevent you from having lung cancer treatment.  Breast Cancer  Practice breast self-awareness. This means understanding how your breasts normally appear and feel.  It also means doing regular breast self-exams. Let your health care provider know about any changes, no matter how small.  If you are in your 20s or 30s, you should have a clinical breast exam (CBE) by a health care provider every 1-3 years as part of a regular health exam.  If you are 75 or older, have a CBE every year. Also consider having a breast X-ray (mammogram) every year.  If you have a family history of breast cancer, talk to your health care provider about genetic screening.  If you are at high risk for breast cancer, talk to your health care provider about having an MRI and a mammogram every year.  Breast cancer gene (BRCA) assessment is recommended for women who have family members with BRCA-related cancers. BRCA-related cancers include: ? Breast. ? Ovarian. ? Tubal. ? Peritoneal cancers.  Results of the assessment will determine the need for genetic counseling and BRCA1 and BRCA2 testing.  Cervical  Cancer Your health care provider may recommend that you be screened regularly for cancer of the pelvic organs (ovaries, uterus, and vagina). This screening involves a pelvic examination, including checking for microscopic changes to the surface of your cervix (Pap test). You may be encouraged to have this screening done every 3 years, beginning at age 16.  For women ages 73-65, health care providers may recommend pelvic exams and Pap testing every 3 years, or they may recommend the Pap and pelvic exam, combined with testing for human papilloma virus (HPV), every 5 years. Some types of HPV increase your risk of cervical cancer. Testing for HPV may also be done on women of any age with unclear Pap test results.  Other health care providers may not recommend any screening for nonpregnant women who are considered low risk for pelvic cancer and who do not have symptoms. Ask your health care provider if a screening pelvic exam is right for you.  If you have had past treatment for cervical cancer or a condition that could lead to cancer, you need Pap tests and screening for cancer for at least 20 years after your treatment. If Pap tests have been discontinued, your risk factors (such as having a new sexual partner) need to be reassessed to determine if screening should resume. Some women have medical problems that increase the chance of getting cervical cancer. In these cases, your health care provider may recommend more frequent screening and Pap tests.  Colorectal Cancer  This type of cancer can be detected and often prevented.  Routine colorectal cancer screening usually begins at 71 years of age and continues through 71 years of age.  Your health care provider may recommend screening at an earlier age if you have risk factors for colon cancer.  Your health care provider may also recommend using home test kits to check for hidden blood in the stool.  A small camera at the end of a tube can be used to  examine your colon directly (sigmoidoscopy or colonoscopy). This is done to check for the earliest forms of colorectal cancer.  Routine screening usually begins at  age 45.  Direct examination of the colon should be repeated every 5-10 years through 71 years of age. However, you may need to be screened more often if early forms of precancerous polyps or small growths are found.  Skin Cancer  Check your skin from head to toe regularly.  Tell your health care provider about any new moles or changes in moles, especially if there is a change in a mole's shape or color.  Also tell your health care provider if you have a mole that is larger than the size of a pencil eraser.  Always use sunscreen. Apply sunscreen liberally and repeatedly throughout the day.  Protect yourself by wearing long sleeves, pants, a wide-brimmed hat, and sunglasses whenever you are outside.  Heart disease, diabetes, and high blood pressure  High blood pressure causes heart disease and increases the risk of stroke. High blood pressure is more likely to develop in: ? People who have blood pressure in the high end of the normal range (130-139/85-89 mm Hg). ? People who are overweight or obese. ? People who are African American.  If you are 56-40 years of age, have your blood pressure checked every 3-5 years. If you are 68 years of age or older, have your blood pressure checked every year. You should have your blood pressure measured twice-once when you are at a hospital or clinic, and once when you are not at a hospital or clinic. Record the average of the two measurements. To check your blood pressure when you are not at a hospital or clinic, you can use: ? An automated blood pressure machine at a pharmacy. ? A home blood pressure monitor.  If you are between 41 years and 68 years old, ask your health care provider if you should take aspirin to prevent strokes.  Have regular diabetes screenings. This involves taking a  blood sample to check your fasting blood sugar level. ? If you are at a normal weight and have a low risk for diabetes, have this test once every three years after 71 years of age. ? If you are overweight and have a high risk for diabetes, consider being tested at a younger age or more often. Preventing infection Hepatitis B  If you have a higher risk for hepatitis B, you should be screened for this virus. You are considered at high risk for hepatitis B if: ? You were born in a country where hepatitis B is common. Ask your health care provider which countries are considered high risk. ? Your parents were born in a high-risk country, and you have not been immunized against hepatitis B (hepatitis B vaccine). ? You have HIV or AIDS. ? You use needles to inject street drugs. ? You live with someone who has hepatitis B. ? You have had sex with someone who has hepatitis B. ? You get hemodialysis treatment. ? You take certain medicines for conditions, including cancer, organ transplantation, and autoimmune conditions.  Hepatitis C  Blood testing is recommended for: ? Everyone born from [birth year] through 1964. ? Anyone with known risk factors for hepatitis C.  Sexually transmitted infections (STIs)  You should be screened for sexually transmitted infections (STIs) including gonorrhea and chlamydia if: ? You are sexually active and are younger than 71 years of age. ? You are older than 71 years of age and your health care provider tells you that you are at risk for this type of infection. ? Your sexual activity has changed since you were last  screened and you are at an increased risk for chlamydia or gonorrhea. Ask your health care provider if you are at risk.  If you do not have HIV, but are at risk, it may be recommended that you take a prescription medicine daily to prevent HIV infection. This is called pre-exposure prophylaxis (PrEP). You are considered at risk if: ? You are sexually active and  do not regularly use condoms or know the HIV status of your partner(s). ? You take drugs by injection. ? You are sexually active with a partner who has HIV.  Talk with your health care provider about whether you are at high risk of being infected with HIV. If you choose to begin PrEP, you should first be tested for HIV. You should then be tested every 3 months for as long as you are taking PrEP. Pregnancy  If you are premenopausal and you may become pregnant, ask your health care provider about preconception counseling.  If you may become pregnant, take 400 to 800 micrograms (mcg) of folic acid every day.  If you want to prevent pregnancy, talk to your health care provider about birth control (contraception). Osteoporosis and menopause  Osteoporosis is a disease in which the bones lose minerals and strength with aging. This can result in serious bone fractures. Your risk for osteoporosis can be identified using a bone density scan.  If you are 62 years of age or older, or if you are at risk for osteoporosis and fractures, ask your health care provider if you should be screened.  Ask your health care provider whether you should take a calcium or vitamin D supplement to lower your risk for osteoporosis.  Menopause may have certain physical symptoms and risks.  Hormone replacement therapy may reduce some of these symptoms and risks. Talk to your health care provider about whether hormone replacement therapy is right for you. Follow these instructions at home:  Schedule regular health, dental, and eye exams.  Stay current with your immunizations.  Do not use any tobacco products including cigarettes, chewing tobacco, or electronic cigarettes.  If you are pregnant, do not drink alcohol.  If you are breastfeeding, limit how much and how often you drink alcohol.  Limit alcohol intake to no more than 1 drink per day for nonpregnant women. One drink equals 12 ounces of beer, 5 ounces of  wine, or 1 ounces of hard liquor.  Do not use street drugs.  Do not share needles.  Ask your health care provider for help if you need support or information about quitting drugs.  Tell your health care provider if you often feel depressed.  Tell your health care provider if you have ever been abused or do not feel safe at home. This information is not intended to replace advice given to you by your health care provider. Make sure you discuss any questions you have with your health care provider. Document Released: 04/20/2011 Document Revised: 03/12/2016 Document Reviewed: 07/09/2015 Elsevier Interactive Patient Education  [employer].

## 2018-05-24 NOTE — Assessment & Plan Note (Signed)
Patient has not had an injection almost 1 year.  Started to have some mild increase in discomfort.  Patient also continues to be active though.  Follow-up again in 4 to 6 weeks

## 2018-05-25 ENCOUNTER — Encounter

## 2018-05-25 ENCOUNTER — Ambulatory Visit: Payer: Medicare HMO | Admitting: Family Medicine

## 2018-05-27 DIAGNOSIS — H5213 Myopia, bilateral: Secondary | ICD-10-CM | POA: Diagnosis not present

## 2018-05-27 DIAGNOSIS — H524 Presbyopia: Secondary | ICD-10-CM | POA: Diagnosis not present

## 2018-05-27 DIAGNOSIS — H5203 Hypermetropia, bilateral: Secondary | ICD-10-CM | POA: Diagnosis not present

## 2018-05-27 DIAGNOSIS — H52209 Unspecified astigmatism, unspecified eye: Secondary | ICD-10-CM | POA: Diagnosis not present

## 2018-06-06 ENCOUNTER — Other Ambulatory Visit: Payer: Self-pay | Admitting: Internal Medicine

## 2018-06-08 ENCOUNTER — Other Ambulatory Visit: Payer: Self-pay | Admitting: Internal Medicine

## 2018-06-24 ENCOUNTER — Encounter: Payer: Self-pay | Admitting: Internal Medicine

## 2018-06-24 ENCOUNTER — Ambulatory Visit (INDEPENDENT_AMBULATORY_CARE_PROVIDER_SITE_OTHER): Payer: Medicare HMO | Admitting: Internal Medicine

## 2018-06-24 VITALS — BP 126/82 | HR 72 | Temp 98.7°F | Ht 63.0 in | Wt 216.0 lb

## 2018-06-24 DIAGNOSIS — R739 Hyperglycemia, unspecified: Secondary | ICD-10-CM

## 2018-06-24 DIAGNOSIS — H6092 Unspecified otitis externa, left ear: Secondary | ICD-10-CM | POA: Diagnosis not present

## 2018-06-24 DIAGNOSIS — Z23 Encounter for immunization: Secondary | ICD-10-CM

## 2018-06-24 DIAGNOSIS — I10 Essential (primary) hypertension: Secondary | ICD-10-CM

## 2018-06-24 MED ORDER — AZITHROMYCIN 250 MG PO TABS: ORAL_TABLET | ORAL | 1 refills | Status: DC

## 2018-06-24 NOTE — Assessment & Plan Note (Signed)
stable overall by history and exam, recent data reviewed with pt, and pt to continue medical treatment as before,  to f/u any worsening symptoms or concerns  

## 2018-06-24 NOTE — Assessment & Plan Note (Signed)
stable overall by history and exam, recent data reviewed with pt, and pt to continue medical treatment as before,  to f/u any worsening symptoms or concerns Lab Results  Component Value Date   HGBA1C 6.6 (H) 04/13/2018

## 2018-06-24 NOTE — Progress Notes (Signed)
Subjective:    Patient ID: [name], female    DOB: [birth year], 72 y.o.   MRN: [id number]  HPI  C/o left post neck pain x 4 days with radiation to the left head, mild to mod, sharp pressure like, , no UE radiation, worse to turn head to the left horizontally, not worse to turn to right;  Also with some left ear pain and overall HA with swelling as well to left head before the ear. No gingivitis or dental pain,  No fever, chills, ST, cough and Pt denies chest pain, increased sob or doe, wheezing, orthopnea, PND, increased LE swelling, palpitations, dizziness or syncope.   Pt denies polydipsia, polyuria, or low sugar symptoms such as weakness or confusion improved with po intake.  Pt states overall good compliance with meds Past Medical History:  Diagnosis Date  . Allergic rhinitis, cause unspecified   . Allergy    SEASONAL  . Anemia   . Chronic headaches   . Diverticulosis   . DJD (degenerative joint disease), lumbar   . GERD (gastroesophageal reflux disease)   . Hemorrhoids   . Hiatal hernia   . HTN (hypertension)   . Hyperlipidemia   . Obesity   . Osteoarthritis   . Tubular adenoma of colon    Past Surgical History:  Procedure Laterality Date  . ABLATION    . COLONOSCOPY    . TUBAL LIGATION      reports that she has never smoked. She has never used smokeless tobacco. She reports that she does not drink alcohol or use drugs. family history includes Breast cancer in her sister; Diabetes in her brother; Heart disease in her mother; Hypertension in her mother. Allergies  Allergen Reactions  . 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 Sulfonamide Derivatives Itching and Rash   Current Outpatient Medications on File Prior to Visit  Medication Sig Dispense Refill  . atorvastatin (LIPITOR) 20 MG tablet TAKE 1 TABLET EVERY DAY 90 tablet 1  . azelastine (ASTELIN) 0.1 % nasal spray 1 puff each  nostril twice daily as needed 30 mL 12  . Black Cohosh 80 MG CAPS Take by mouth.    . cetirizine (ZYRTEC) 10 MG tablet Take 1 tablet (10 mg total) by mouth daily. As needed 90 tablet 3  . Cholecalciferol (VITAMIN D3) 2000 UNITS capsule Take 2,000 Units by mouth daily.    Marland Kitchen diltiazem (TIAZAC) 120 MG 24 hr capsule TAKE 1 CAPSULE EVERY DAY 90 capsule 1  . DULoxetine (CYMBALTA) 20 MG capsule TAKE 2 CAPSULES BY MOUTH ONCE DAILY 180 capsule 1  . esomeprazole (NEXIUM) 40 MG capsule Take 1 capsule (40 mg total) by mouth daily. 30 capsule 10  . fluticasone (FLONASE) 50 MCG/ACT nasal spray Place 2 sprays into both nostrils daily. 16 g 0  . guaiFENesin (MUCINEX) 600 MG 12 hr tablet Take 1 tablet (600 mg total) by mouth 2 (two) times daily as needed for cough or to loosen phlegm. 14 tablet 0  . losartan-hydrochlorothiazide (HYZAAR) 100-25 MG tablet TAKE 1 TABLET EVERY DAY 90 tablet 0  . metoprolol tartrate (LOPRESSOR) 50 MG tablet TAKE 1 TABLET TWICE DAILY 180 tablet 3  . ranitidine (ZANTAC) 150 MG tablet Take 1 tablet every evening as needed 30 tablet 6  . sucralfate (CARAFATE) 1 G tablet Take 1 g by mouth as needed. Reported on 12/20/2015    . tiZANidine (ZANAFLEX) 4  MG capsule Take 8 mg by mouth daily.    . traMADol (ULTRAM) 50 MG tablet Take 1 tablet by mouth every 6 (six) hours as needed.  0  . triamcinolone (NASACORT AQ) 55 MCG/ACT AERO nasal inhaler Place 2 sprays into the nose daily. 1 Inhaler 3  . triamcinolone ointment (KENALOG) 0.5 % Apply 1 application topically 2 (two) times daily as needed. 30 g 1  . Turmeric 500 MG CAPS Take 1 capsule by mouth daily.     No current facility-administered medications on file prior to visit.    Review of Systems  Constitutional: Negative for other unusual diaphoresis or sweats HENT: Negative for ear discharge or swelling Eyes: Negative for other worsening visual disturbances Respiratory: Negative for stridor or other swelling  Gastrointestinal: Negative for  worsening distension or other blood Genitourinary: Negative for retention or other urinary change Musculoskeletal: Negative for other MSK pain or swelling Skin: Negative for color change or other new lesions Neurological: Negative for worsening tremors and other numbness  Psychiatric/Behavioral: Negative for worsening agitation or other fatigue All other system neg per pt    Objective:   Physical Exam BP 126/82   Pulse 72   Temp 98.7 F (37.1 C) (Oral)   Ht 5\' 3"  (1.6 m)   Wt 216 lb (98 kg)   SpO2 93%   BMI 38.26 kg/m  VS noted,  Constitutional: Pt appears in NAD HENT: Head: NCAT.  Right Ear: External ear normal.  Left Ear: External ear normal.  Eyes: . Pupils are equal, round, and reactive to light. Conjunctivae and EOM are normal Nose: without d/c or deformity Neck: Neck supple. Gross normal ROM Cardiovascular: Normal rate and regular rhythm.   Pulmonary/Chest: Effort normal and breath sounds without rales or wheezing.  Abd:  Soft, NT, ND, + BS, no organomegaly Neurological: Pt is alert. At baseline orientation, motor grossly intact Skin: Skin is warm. No rashes, other new lesions, no LE edema Psychiatric: Pt behavior is normal without agitation  Bilat tm's with mild erythema, as well as left external canal mild red, tender with preauricular swelling and left mild submandibular LA, also left  Max sinus areas mild tender.  Pharynx with mild erythema, no exudate   Lab Results  Component Value Date   WBC 7.6 04/13/2018   HGB 13.2 04/13/2018   HCT 39.1 04/13/2018   PLT 317.0 04/13/2018   GLUCOSE 109 (H) 04/13/2018   CHOL 198 04/13/2018   TRIG 189.0 (H) 04/13/2018   HDL 57.70 04/13/2018   LDLDIRECT 168.8 01/20/2012   LDLCALC 102 (H) 04/13/2018   ALT 16 04/13/2018   AST 14 04/13/2018   NA 141 04/13/2018   K 3.7 04/13/2018   CL 100 04/13/2018   CREATININE 0.83 04/13/2018   BUN 21 04/13/2018   CO2 33 (H) 04/13/2018   TSH 0.67 04/13/2018   HGBA1C 6.6 (H) 04/13/2018         Assessment & Plan:

## 2018-06-24 NOTE — Patient Instructions (Signed)
Please take all new medication as prescribed - the antibiotic  Please continue all other medications as before, and refills have been done if requested.  Please have the pharmacy call with any other refills you may need.  Please continue your efforts at being more active, low cholesterol diet, and weight control.  Please keep your appointments with your specialists as you may have planned    

## 2018-06-24 NOTE — Assessment & Plan Note (Signed)
Mild but with preauricular swelling, tender, LA - Mild to mod, for antibx course,  to f/u any worsening symptoms or concerns

## 2018-06-27 ENCOUNTER — Other Ambulatory Visit: Payer: Self-pay | Admitting: Family Medicine

## 2018-06-27 NOTE — Telephone Encounter (Signed)
Refill done.  

## 2018-06-29 ENCOUNTER — Telehealth: Payer: Self-pay | Admitting: Internal Medicine

## 2018-06-29 NOTE — Telephone Encounter (Signed)
Copied from [place] [id number]. Topic: Quick Communication - See Telephone Encounter >> Jun 29, 2018  4:23 PM Gardiner Ramus wrote: CRM for notification. See Telephone encounter for: 06/29/18. Pt called and stated that she was seen 06/24/18 for neck pain. Pt states that the pain is the same. Pt would like something called in. Please advise

## 2018-06-30 NOTE — Telephone Encounter (Signed)
Patient would like to put a hold on this request until she sees Dr Tamala Julian.

## 2018-07-04 NOTE — Progress Notes (Signed)
[name] [place] [place], [place] [address] Phone: [phone] Subjective:   [name], am serving as a scribe for Dr. Hulan Saas.  CC: Bilateral knee pain  [id number]  [name] is a 72 y.o. female coming in with complaint of bilateral knee pain. Chronic pain, right greater than left. Constant pain with weight bearing.  Patient is walking with the aid of a cane.  Still having difficulty with her back as well.  Was seen in neurosurgery in the near future.  Having some instability of the legs does not know if it is the knees or the back.     Past Medical History:  Diagnosis Date  . Allergic rhinitis, cause unspecified   . Allergy    SEASONAL  . Anemia   . Chronic headaches   . Diverticulosis   . DJD (degenerative joint disease), lumbar   . GERD (gastroesophageal reflux disease)   . Hemorrhoids   . Hiatal hernia   . HTN (hypertension)   . Hyperlipidemia   . Obesity   . Osteoarthritis   . Tubular adenoma of colon    Past Surgical History:  Procedure Laterality Date  . ABLATION    . COLONOSCOPY    . TUBAL LIGATION     Social History   Socioeconomic History  . Marital status: Widowed    Spouse name: Not on file  . Number of children: 3  . Years of education: 60  . Highest education level: Not on file  Occupational History  . Occupation: retired    Fish farm manager: UNEMPLOYED  Social Needs  . Financial resource strain: Not hard at all  . Food insecurity:    Worry: Never true    Inability: Never true  . Transportation needs:    Medical: No    Non-medical: No  Tobacco Use  . Smoking status: Never Smoker  . Smokeless tobacco: Never Used  Substance and Sexual Activity  . Alcohol use: No    Alcohol/week: 0.0 standard drinks  . Drug use: No  . Sexual activity: Never  Lifestyle  . Physical activity:    Days per week: 3 days    Minutes per session: 50 min  . Stress: Not at all  Relationships  . Social  connections:    Talks on phone: More than three times a week    Gets together: More than three times a week    Attends religious service: More than 4 times per year    Active member of club or organization: Yes    Attends meetings of clubs or organizations: More than 4 times per year    Relationship status: Widowed  Other Topics Concern  . Not on file  Social History Narrative  . Not on file   Allergies  Allergen Reactions  . 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 Sulfonamide Derivatives Itching and Rash   Family History  Problem Relation Age of Onset  . Heart disease Mother   . Hypertension Mother   . Diabetes Brother   . Breast cancer Sister   . Colon cancer Neg Hx   . Esophageal cancer Neg Hx   . Rectal cancer Neg Hx   . Stomach cancer Neg Hx      Current Outpatient Medications (Cardiovascular):  .  atorvastatin (LIPITOR) 20 MG tablet, TAKE 1 TABLET EVERY DAY .  diltiazem (TIAZAC) 120 MG  24 hr capsule, TAKE 1 CAPSULE EVERY DAY .  losartan-hydrochlorothiazide (HYZAAR) 100-25 MG tablet, TAKE 1 TABLET EVERY DAY .  metoprolol tartrate (LOPRESSOR) 50 MG tablet, TAKE 1 TABLET TWICE DAILY  Current Outpatient Medications (Respiratory):  .  azelastine (ASTELIN) 0.1 % nasal spray, 1 puff each nostril twice daily as needed .  cetirizine (ZYRTEC) 10 MG tablet, Take 1 tablet (10 mg total) by mouth daily. As needed .  fluticasone (FLONASE) 50 MCG/ACT nasal spray, Place 2 sprays into both nostrils daily. Marland Kitchen  guaiFENesin (MUCINEX) 600 MG 12 hr tablet, Take 1 tablet (600 mg total) by mouth 2 (two) times daily as needed for cough or to loosen phlegm. .  triamcinolone (NASACORT AQ) 55 MCG/ACT AERO nasal inhaler, Place 2 sprays into the nose daily.  Current Outpatient Medications (Analgesics):  .  traMADol (ULTRAM) 50 MG tablet, Take 1 tablet by mouth every 6 (six) hours as needed.   Current Outpatient  Medications (Other):  .  azithromycin (ZITHROMAX Z-PAK) 250 MG tablet, 2 tab by mouth day 1, then 1 per day .  Black Cohosh 80 MG CAPS, Take by mouth. .  Cholecalciferol (VITAMIN D3) 2000 UNITS capsule, Take 2,000 Units by mouth daily. .  DULoxetine (CYMBALTA) 20 MG capsule, TAKE 2 CAPSULES BY MOUTH EVERY DAY .  esomeprazole (NEXIUM) 40 MG capsule, Take 1 capsule (40 mg total) by mouth daily. .  ranitidine (ZANTAC) 150 MG tablet, Take 1 tablet every evening as needed .  sucralfate (CARAFATE) 1 G tablet, Take 1 g by mouth as needed. Reported on 12/20/2015 .  tiZANidine (ZANAFLEX) 4 MG capsule, Take 8 mg by mouth daily. Marland Kitchen  triamcinolone ointment (KENALOG) 0.5 %, Apply 1 application topically 2 (two) times daily as needed. .  Turmeric 500 MG CAPS, Take 1 capsule by mouth daily.    Past medical history, social, surgical and family history all reviewed in electronic medical record.  No pertanent information unless stated regarding to the chief complaint.   Review of Systems:  No headache, visual changes, nausea, vomiting, diarrhea, constipation, dizziness, abdominal pain, skin rash, fevers, chills, night sweats, weight loss, swollen lymph nodes, body aches,  chest pain, shortness of breath, mood changes.  Positive muscle aches, joint swelling  Objective  Blood pressure (!) 132/94, pulse 69, height 5\' 3"  (1.6 m), weight 214 lb (97.1 kg), SpO2 94 %.    General: No apparent distress alert and oriented x3 mood and affect normal, dressed appropriately.  Obese HEENT: Pupils equal, extraocular movements intact  Respiratory: Patient's speak in full sentences and does not appear short of breath  Cardiovascular: Trace lower extremity edema, non tender, no erythema  Skin: Warm dry intact with no signs of infection or rash on extremities or on axial skeleton.  Abdomen: Soft nontender  Neuro: Cranial nerves II through XII are intact, neurovascularly intact in all extremities with 2+ DTRs and 2+ pulses.    Lymph: No lymphadenopathy of posterior or anterior cervical chain or axillae bilaterally.  Gait antalgic MSK:  Non tender with full range of motion and good stability and symmetric strength and tone of shoulders, elbows, wrist, hip, and ankles bilaterally.  Knee: Bilateral valgus deformity noted. Large thigh to calf ratio.  Tender to palpation over medial and PF joint line.  Mild limited range of motion lacking last 5 degrees of extension and 10 degrees of flexion instability with valgus force.  painful patellar compression. Patellar glide with moderate crepitus. Patellar and quadriceps tendons unremarkable. Hamstring and quadriceps strength is  normal.  After informed written and verbal consent, patient was seated on exam table. Right knee was prepped with alcohol swab and utilizing anterolateral approach, patient's right knee space was injected with 4:1  marcaine 0.5%: Kenalog 40mg /dL. Patient tolerated the procedure well without immediate complications.  After informed written and verbal consent, patient was seated on exam table. Left knee was prepped with alcohol swab and utilizing anterolateral approach, patient's left knee space was injected with 4:1  marcaine 0.5%: Kenalog 40mg /dL. Patient tolerated the procedure well without immediate complications.    Impression and Recommendations:     This case required medical decision making of moderate complexity. The above documentation has been reviewed and is accurate and complete Lyndal Pulley, DO       Note: This dictation was prepared with Dragon dictation along with smaller phrase technology. Any transcriptional errors that result from this process are unintentional.

## 2018-07-05 ENCOUNTER — Ambulatory Visit (INDEPENDENT_AMBULATORY_CARE_PROVIDER_SITE_OTHER): Payer: Medicare HMO | Admitting: Family Medicine

## 2018-07-05 ENCOUNTER — Encounter: Payer: Self-pay | Admitting: Family Medicine

## 2018-07-05 DIAGNOSIS — M170 Bilateral primary osteoarthritis of knee: Secondary | ICD-10-CM

## 2018-07-05 DIAGNOSIS — M48062 Spinal stenosis, lumbar region with neurogenic claudication: Secondary | ICD-10-CM | POA: Diagnosis not present

## 2018-07-05 DIAGNOSIS — Z6837 Body mass index (BMI) 37.0-37.9, adult: Secondary | ICD-10-CM | POA: Diagnosis not present

## 2018-07-05 NOTE — Assessment & Plan Note (Signed)
Encourage patient to lose weight.  Not affecting patient's joints.  Patient does not want to do the health and wellness at this time

## 2018-07-05 NOTE — Patient Instructions (Signed)
Injected the knees today  I hope it helps  I hope the back goes well and if they decide to do any surgery give me a call on the date See me again In 10 weeks otherwise or call me when you need me

## 2018-07-05 NOTE — Assessment & Plan Note (Signed)
Likely given some of the weakness in the legs.  Patient is following up with neurosurgery to discuss.  Failed all conservative therapy at this time

## 2018-07-05 NOTE — Assessment & Plan Note (Signed)
Bilateral injections given again today.  Discussed again about the possibility of Visco supplementation which patient wants to avoid at the moment.  We discussed icing regimen and home exercise.  I do believe that some of the pain is from patient's back.  Patient is going to be following up with neurosurgery to discuss other treatment options in the near future.  Follow-up again in 10 weeks

## 2018-07-19 DIAGNOSIS — I10 Essential (primary) hypertension: Secondary | ICD-10-CM | POA: Diagnosis not present

## 2018-07-19 DIAGNOSIS — M48062 Spinal stenosis, lumbar region with neurogenic claudication: Secondary | ICD-10-CM | POA: Diagnosis not present

## 2018-07-19 DIAGNOSIS — Z6837 Body mass index (BMI) 37.0-37.9, adult: Secondary | ICD-10-CM | POA: Diagnosis not present

## 2018-07-25 ENCOUNTER — Other Ambulatory Visit: Payer: Self-pay | Admitting: Internal Medicine

## 2018-08-01 ENCOUNTER — Telehealth: Payer: Self-pay | Admitting: Internal Medicine

## 2018-08-01 MED ORDER — CIPROFLOXACIN HCL 500 MG PO TABS: 500.0000 mg | ORAL_TABLET | Freq: Two times a day (BID) | ORAL | 0 refills | Status: AC

## 2018-08-01 NOTE — Telephone Encounter (Signed)
Please advise 

## 2018-08-01 NOTE — Telephone Encounter (Signed)
Copied from [place] [id number]. Topic: Quick Communication - See Telephone Encounter >> Aug 01, 2018 10:00 AM Valla Leaver wrote: CRM for notification. See Telephone encounter for: 08/01/18. Patient still experiencing neck and head pain after taking the antibiotics from Dr. Jenny Reichmann. She now ha ear pain as well and wants to know what he recommends. Patient expressed transportation concerns and his next available is not until 09/06/2018.

## 2018-08-01 NOTE — Telephone Encounter (Signed)
Ok to take new rx for Cipro 500 mg - done erx

## 2018-08-02 NOTE — Telephone Encounter (Signed)
Pt has been informed.

## 2018-09-01 DIAGNOSIS — I10 Essential (primary) hypertension: Secondary | ICD-10-CM | POA: Diagnosis not present

## 2018-09-01 DIAGNOSIS — Z6838 Body mass index (BMI) 38.0-38.9, adult: Secondary | ICD-10-CM | POA: Diagnosis not present

## 2018-09-01 DIAGNOSIS — M48062 Spinal stenosis, lumbar region with neurogenic claudication: Secondary | ICD-10-CM | POA: Diagnosis not present

## 2018-09-07 NOTE — Progress Notes (Signed)
[name] [place], [place] [address] Phone: [phone] Subjective:    I [name] am serving as a Education administrator for Dr. Hulan Saas.   CC: Bilateral knee pain  [id number]  [name] is a 72 y.o. female coming in with complaint of bilateral knee pain. Knees are stiff today. Would like bilateral injections.  Patient has severe arthritis.  Difficulty with walking     Past Medical History:  Diagnosis Date  . Allergic rhinitis, cause unspecified   . Allergy    SEASONAL  . Anemia   . Chronic headaches   . Diverticulosis   . DJD (degenerative joint disease), lumbar   . GERD (gastroesophageal reflux disease)   . Hemorrhoids   . Hiatal hernia   . HTN (hypertension)   . Hyperlipidemia   . Obesity   . Osteoarthritis   . Tubular adenoma of colon    Past Surgical History:  Procedure Laterality Date  . ABLATION    . COLONOSCOPY    . TUBAL LIGATION     Social History   Socioeconomic History  . Marital status: Widowed    Spouse name: Not on file  . Number of children: 3  . Years of education: 64  . Highest education level: Not on file  Occupational History  . Occupation: retired    Fish farm manager: UNEMPLOYED  Social Needs  . Financial resource strain: Not hard at all  . Food insecurity:    Worry: Never true    Inability: Never true  . Transportation needs:    Medical: No    Non-medical: No  Tobacco Use  . Smoking status: Never Smoker  . Smokeless tobacco: Never Used  Substance and Sexual Activity  . Alcohol use: No    Alcohol/week: 0.0 standard drinks  . Drug use: No  . Sexual activity: Never  Lifestyle  . Physical activity:    Days per week: 3 days    Minutes per session: 50 min  . Stress: Not at all  Relationships  . Social connections:    Talks on phone: More than three times a week    Gets together: More than three times a week    Attends religious service: More than 4 times per year    Active  member of club or organization: Yes    Attends meetings of clubs or organizations: More than 4 times per year    Relationship status: Widowed  Other Topics Concern  . Not on file  Social History Narrative  . Not on file   Allergies  Allergen Reactions  . 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 Sulfonamide Derivatives Itching and Rash   Family History  Problem Relation Age of Onset  . Heart disease Mother   . Hypertension Mother   . Diabetes Brother   . Breast cancer Sister   . Colon cancer Neg Hx   . Esophageal cancer Neg Hx   . Rectal cancer Neg Hx   . Stomach cancer Neg Hx      Current Outpatient Medications (Cardiovascular):  .  atorvastatin (LIPITOR) 20 MG tablet, TAKE 1 TABLET EVERY DAY .  diltiazem (TIAZAC) 120 MG 24 hr capsule, TAKE 1 CAPSULE EVERY DAY .  losartan-hydrochlorothiazide (HYZAAR) 100-25 MG tablet, TAKE 1 TABLET EVERY DAY .  metoprolol tartrate (LOPRESSOR) 50 MG tablet, TAKE 1 TABLET TWICE DAILY  Current Outpatient Medications (Respiratory):  .  azelastine (ASTELIN) 0.1 % nasal spray, 1 puff each nostril twice daily as needed .  cetirizine (ZYRTEC) 10 MG tablet, Take 1 tablet (10 mg total) by mouth daily. As needed .  fluticasone (FLONASE) 50 MCG/ACT nasal spray, Place 2 sprays into both nostrils daily. Marland Kitchen  guaiFENesin (MUCINEX) 600 MG 12 hr tablet, Take 1 tablet (600 mg total) by mouth 2 (two) times daily as needed for cough or to loosen phlegm. .  triamcinolone (NASACORT AQ) 55 MCG/ACT AERO nasal inhaler, Place 2 sprays into the nose daily.  Current Outpatient Medications (Analgesics):  .  traMADol (ULTRAM) 50 MG tablet, Take 1 tablet by mouth every 6 (six) hours as needed.   Current Outpatient Medications (Other):  .  azithromycin (ZITHROMAX Z-PAK) 250 MG tablet, 2 tab by mouth day 1, then 1 per day .  Black Cohosh 80 MG CAPS, Take by mouth. .  Cholecalciferol (VITAMIN  D3) 2000 UNITS capsule, Take 2,000 Units by mouth daily. .  DULoxetine (CYMBALTA) 20 MG capsule, TAKE 2 CAPSULES BY MOUTH EVERY DAY .  esomeprazole (NEXIUM) 40 MG capsule, Take 1 capsule (40 mg total) by mouth daily. .  ranitidine (ZANTAC) 150 MG tablet, Take 1 tablet every evening as needed .  sucralfate (CARAFATE) 1 G tablet, Take 1 g by mouth as needed. Reported on 12/20/2015 .  tiZANidine (ZANAFLEX) 4 MG capsule, Take 8 mg by mouth daily. Marland Kitchen  triamcinolone ointment (KENALOG) 0.5 %, Apply 1 application topically 2 (two) times daily as needed. .  Turmeric 500 MG CAPS, Take 1 capsule by mouth daily. .  AMBULATORY NON FORMULARY MEDICATION, 4 prong cane    Past medical history, social, surgical and family history all reviewed in electronic medical record.  No pertanent information unless stated regarding to the chief complaint.   Review of Systems:  No headache, visual changes, nausea, vomiting, diarrhea, constipation, dizziness, abdominal pain, skin rash, fevers, chills, night sweats, weight loss, swollen lymph nodes, body aches, joint swelling,, chest pain, shortness of breath, mood changes.  Positive muscle aches  Objective  Blood pressure (!) 140/92, pulse 69, height 5\' 3"  (1.6 m), weight 217 lb (98.4 kg), SpO2 91 %.   General: No apparent distress alert and oriented x3 mood and affect normal, dressed appropriately.  HEENT: Pupils equal, extraocular movements intact  Respiratory: Patient's speak in full sentences and does not appear short of breath  Cardiovascular: No lower extremity edema, non tender, no erythema  Skin: Warm dry intact with no signs of infection or rash on extremities or on axial skeleton.  Abdomen: Soft nontender  Neuro: Cranial nerves II through XII are intact, neurovascularly intact in all extremities with 2+ DTRs and 2+ pulses.  Lymph: No lymphadenopathy of posterior or anterior cervical chain or axillae bilaterally.  Gait antalgic MSK:  tender with limited range of  motion and good stability and symmetric strength and tone of shoulders, elbows, wrist, hip, and ankles bilaterally.  Knee: Bilateral valgus deformity noted. Large thigh to calf ratio.  Tender to palpation over medial and PF joint line.  ROM full in flexion and extension and lower leg rotation. instability with valgus force.  painful patellar compression. Patellar glide with moderate crepitus. Patellar and quadriceps tendons unremarkable. Hamstring and quadriceps strength is normal.   After informed written and verbal consent, patient was seated on exam table. Right knee was prepped with alcohol swab and utilizing anterolateral approach, patient's right knee space was injected with 4:1  marcaine 0.5%: Kenalog 40mg /dL. Patient tolerated the procedure  well without immediate complications.  After informed written and verbal consent, patient was seated on exam table. Left knee was prepped with alcohol swab and utilizing anterolateral approach, patient's left knee space was injected with 4:1  marcaine 0.5%: Kenalog 40mg /dL. Patient tolerated the procedure well without immediate complications.    Impression and Recommendations:     This case required medical decision making of moderate complexity. The above documentation has been reviewed and is accurate and complete Brandy Pulley, DO       Note: This dictation was prepared with Dragon dictation along with smaller phrase technology. Any transcriptional errors that result from this process are unintentional.

## 2018-09-08 ENCOUNTER — Encounter: Payer: Self-pay | Admitting: Family Medicine

## 2018-09-08 ENCOUNTER — Ambulatory Visit: Payer: Medicare HMO | Admitting: Family Medicine

## 2018-09-08 ENCOUNTER — Other Ambulatory Visit: Payer: Self-pay

## 2018-09-08 DIAGNOSIS — M170 Bilateral primary osteoarthritis of knee: Secondary | ICD-10-CM

## 2018-09-08 MED ORDER — AMBULATORY NON FORMULARY MEDICATION: 0 refills | Status: DC

## 2018-09-08 NOTE — Assessment & Plan Note (Signed)
Bilateral steroid injections given today.  Discussed the possibility of Visco supplementation again.  Discussed icing regimen, topical anti-inflammatories.  Follow-up again in 10 weeks

## 2018-09-08 NOTE — Patient Instructions (Signed)
Happy holidays! You will do great  See me again in 10 weeks!

## 2018-09-23 ENCOUNTER — Telehealth: Payer: Self-pay | Admitting: Internal Medicine

## 2018-09-23 NOTE — Telephone Encounter (Signed)
Copied from [place] [id number]. Topic: Quick Communication - See Telephone Encounter >> Sep 23, 2018 11:51 AM Blase Mess A wrote: CRM for notification. See Telephone encounter for: 09/23/18.  Left voice message for Ria Comment to see asssist the patient with scheduling a sooner appt with Dr. Tamala Julian. The patient is also requesting more medication for knee pain please advise

## 2018-09-26 NOTE — Telephone Encounter (Signed)
Pt scheduled for 12/11 @ 2pm.

## 2018-09-26 NOTE — Telephone Encounter (Signed)
Spoke to pt, offered her an appt on 12/11, pt will call me back to let me know if that time will work.

## 2018-09-28 ENCOUNTER — Ambulatory Visit: Payer: Medicare HMO | Admitting: Family Medicine

## 2018-09-28 ENCOUNTER — Encounter: Payer: Self-pay | Admitting: Family Medicine

## 2018-09-28 DIAGNOSIS — G8929 Other chronic pain: Secondary | ICD-10-CM | POA: Diagnosis not present

## 2018-09-28 DIAGNOSIS — M25562 Pain in left knee: Principal | ICD-10-CM

## 2018-09-28 DIAGNOSIS — M170 Bilateral primary osteoarthritis of knee: Secondary | ICD-10-CM

## 2018-09-28 DIAGNOSIS — M25561 Pain in right knee: Principal | ICD-10-CM

## 2018-09-28 NOTE — Progress Notes (Signed)
[name] [place], [place] [address] Phone: [phone] Subjective:    CC: Bilateral knee pain  [name]  [name] is a 72 y.o. female coming in with complaint of bilateral knee pain.  Severe arthritic changes.  Steroid injection given not long ago.  Rating worsening pain again.  Patient has been approved for Visco supplementation.  Patient is only ambulating with the aid of a cane.  States that she has pain on a daily basis and increasing instability     Past Medical History:  Diagnosis Date  . Allergic rhinitis, cause unspecified   . Allergy    SEASONAL  . Anemia   . Chronic headaches   . Diverticulosis   . DJD (degenerative joint disease), lumbar   . GERD (gastroesophageal reflux disease)   . Hemorrhoids   . Hiatal hernia   . HTN (hypertension)   . Hyperlipidemia   . Obesity   . Osteoarthritis   . Tubular adenoma of colon    Past Surgical History:  Procedure Laterality Date  . ABLATION    . COLONOSCOPY    . TUBAL LIGATION     Social History   Socioeconomic History  . Marital status: Widowed    Spouse name: Not on file  . Number of children: 3  . Years of education: 36  . Highest education level: Not on file  Occupational History  . Occupation: retired    Fish farm manager: UNEMPLOYED  Social Needs  . Financial resource strain: Not hard at all  . Food insecurity:    Worry: Never true    Inability: Never true  . Transportation needs:    Medical: No    Non-medical: No  Tobacco Use  . Smoking status: Never Smoker  . Smokeless tobacco: Never Used  Substance and Sexual Activity  . Alcohol use: No    Alcohol/week: 0.0 standard drinks  . Drug use: No  . Sexual activity: Never  Lifestyle  . Physical activity:    Days per week: 3 days    Minutes per session: 50 min  . Stress: Not at all  Relationships  . Social connections:    Talks on phone: More than three times a week    Gets together: More than  three times a week    Attends religious service: More than 4 times per year    Active member of club or organization: Yes    Attends meetings of clubs or organizations: More than 4 times per year    Relationship status: Widowed  Other Topics Concern  . Not on file  Social History Narrative  . Not on file   Allergies  Allergen Reactions  . 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 Sulfonamide Derivatives Itching and Rash   Family History  Problem Relation Age of Onset  . Heart disease Mother   . Hypertension Mother   . Diabetes Brother   . Breast cancer Sister   . Colon cancer Neg Hx   . Esophageal cancer Neg Hx   . Rectal cancer Neg Hx   . Stomach cancer Neg Hx      Current Outpatient Medications (Cardiovascular):  .  atorvastatin (LIPITOR) 20 MG tablet, TAKE 1 TABLET EVERY DAY .  diltiazem (TIAZAC) 120 MG 24 hr capsule, TAKE 1 CAPSULE EVERY DAY .  losartan-hydrochlorothiazide (HYZAAR) 100-25 MG tablet, TAKE 1 TABLET EVERY DAY .  metoprolol tartrate (LOPRESSOR) 50 MG tablet, TAKE 1 TABLET TWICE DAILY  Current Outpatient Medications (Respiratory):  .  cetirizine (ZYRTEC) 10 MG tablet, Take 1 tablet (10 mg total) by mouth daily. As needed  Current Outpatient Medications (Analgesics):  .  traMADol (ULTRAM) 50 MG tablet, Take 1 tablet by mouth every 6 (six) hours as needed.   Current Outpatient Medications (Other):  Marland Kitchen  AMBULATORY NON FORMULARY MEDICATION, 4 prong cane .  Black Cohosh 80 MG CAPS, Take by mouth. .  Cholecalciferol (VITAMIN D3) 2000 UNITS capsule, Take 2,000 Units by mouth daily. .  DULoxetine (CYMBALTA) 20 MG capsule, TAKE 2 CAPSULES BY MOUTH EVERY DAY .  esomeprazole (NEXIUM) 40 MG capsule, Take 1 capsule (40 mg total) by mouth daily. .  ranitidine (ZANTAC) 150 MG tablet, Take 1 tablet every evening as needed .  sucralfate (CARAFATE) 1 G tablet, Take 1 g by mouth as needed.  Reported on 12/20/2015 .  tiZANidine (ZANAFLEX) 4 MG capsule, Take 8 mg by mouth daily. Marland Kitchen  triamcinolone ointment (KENALOG) 0.5 %, Apply 1 application topically 2 (two) times daily as needed. .  Turmeric 500 MG CAPS, Take 1 capsule by mouth daily.    Past medical history, social, surgical and family history all reviewed in electronic medical record.  No pertanent information unless stated regarding to the chief complaint.   Review of Systems:  No headache, visual changes, nausea, vomiting, diarrhea, constipation, dizziness, abdominal pain, skin rash, fevers, chills, night sweats, weight loss, swollen lymph nodes, body aches, joint swelling, muscle aches, chest pain, shortness of breath, mood changes.   Objective  Blood pressure 140/90, pulse 84, resp. rate (!) 95, height 5\' 3"  (1.6 m), weight 211 lb (95.7 kg).   General: No apparent distress alert and oriented x3 mood and affect normal, dressed appropriately.  HEENT: Pupils equal, extraocular movements intact  Respiratory: Patient's speak in full sentences and does not appear short of breath  Cardiovascular: Trace lower extremity edema, non tender, no erythema  Skin: Warm dry intact with no signs of infection or rash on extremities or on axial skeleton.  Abdomen: Soft nontender obese Neuro: Cranial nerves II through XII are intact, neurovascularly intact in all extremities with 2+ DTRs and 2+ pulses.  Lymph: No lymphadenopathy of posterior or anterior cervical chain or axillae bilaterally.  Gait antalgic walking with the aid of a cane MSK:  tender with full range of motion and good stability and symmetric strength and tone of shoulders, elbows, wrist, hip, and ankles bilaterally.   Knee: Bilateral valgus deformity noted. Large thigh to calf ratio.  Tender to palpation over medial and PF joint line.  ROM full in flexion and extension and lower leg rotation. instability with valgus force.  painful patellar compression. Patellar glide with  moderate crepitus. Patellar and quadriceps tendons unremarkable. Hamstring and quadriceps strength is normal.  After informed written and verbal consent, patient was seated on exam table. Right knee was prepped with alcohol swab and utilizing anterolateral approach, patient's right knee space was injected with 22 mg/mL of Monovisc c(sodium hyaluronate) in a prefilled syringe was injected easily into the knee through a 22-gauge needle..Patient tolerated the procedure well without immediate complications.  After informed written and verbal consent, patient was seated on exam table. Left knee was prepped with alcohol swab and utilizing anterolateral approach, patient's left knee space was injected with 22 mg/mL of Monovisc (sodium hyaluronate) in a prefilled syringe was injected easily into the knee through a 22-gauge needle..Patient tolerated the  procedure well without immediate complications.   Impression and Recommendations:     This case required medical decision making of moderate complexity. The above documentation has been reviewed and is accurate and complete Lyndal Pulley, DO       Note: This dictation was prepared with Dragon dictation along with smaller phrase technology. Any transcriptional errors that result from this process are unintentional.

## 2018-09-28 NOTE — Assessment & Plan Note (Signed)
Bilateral viscosupplementation given again today.  Discussed icing regimen and home exercise.  Discussed which activities to do which was to avoid.  Increase activity as tolerated.  Patient will need to be started considering the possibility of a knee replacement in the near future.

## 2018-09-28 NOTE — Patient Instructions (Signed)
Good to see you  Happy holidays!  You know the drill  See me again in 2 months

## 2018-10-18 DIAGNOSIS — M48062 Spinal stenosis, lumbar region with neurogenic claudication: Secondary | ICD-10-CM | POA: Diagnosis not present

## 2018-11-14 ENCOUNTER — Telehealth: Payer: Self-pay | Admitting: Internal Medicine

## 2018-11-14 NOTE — Telephone Encounter (Signed)
Spoke to pt for clarificaiton. The mail order pharmacy is [facility]. It has been updated in her chart.

## 2018-11-14 NOTE — Telephone Encounter (Signed)
Copied from [place] [id number]. Topic: General - Other >> Nov 14, 2018 12:24 PM Judyann Munson wrote: Reason for CRM: patient is calling to state she is needing all your list of current medication sent over to  [facility]- due to the patient insurance change, the contact number is [phone] with a fax number of [phone]

## 2018-11-21 ENCOUNTER — Ambulatory Visit: Payer: Self-pay | Admitting: *Deleted

## 2018-11-21 NOTE — Telephone Encounter (Signed)
Pt reports chronic knee pain, osteoarthritis. Saw Dr. Tamala Julian last on 09/28/18. States "Right knee has been giving out." resulting in falls over weekend and this AM. States "Just went down on my knees." States using quad cane. Denies any injuries. States was able to get up independently.  Taking tramadol for pain, reports at 10/10. Next available appt with Dr. Tamala Julian, Friday. [place] called and spoke with Ria Comment. Appt secured for tomorrow AM. Care advise given per protocol; pt verbalizes understanding.   Reason for Disposition . Knee giving way (or buckling) when walking is a chronic symptom (recurrent or ongoing AND present > 4 weeks)    Resulting in fall "Went down on my knees"  Answer Assessment - Initial Assessment Questions 1. LOCATION and RADIATION: "Where is the pain located?"      Both knees, >right knee 2. QUALITY: "What does the pain feel like?"  (e.g., sharp, dull, aching, burning)     "Arthritis"  3. SEVERITY: "How bad is the pain?" "What does it keep you from doing?"   (Scale 1-10; or mild, moderate, severe)   -  MILD (1-3): doesn't interfere with normal activities    -  MODERATE (4-7): interferes with normal activities (e.g., work or school) or awakens from sleep, limping    -  SEVERE (8-10): excruciating pain, unable to do any normal activities, unable to walk     10/10 4. ONSET: "When did the pain start?" "Does it come and go, or is it there all the time?"   Osteoarthritis, saw Dr. Tamala Julian in December 5. RECURRENT: "Have you had this pain before?" If so, ask: "When, and what happened then?"    Ongoing 6. SETTING: "Has there been any recent work, exercise or other activity that involved that part of the body?"      no 7. AGGRAVATING FACTORS: "What makes the knee pain worse?" (e.g., walking, climbing stairs, running)     Walking 8. ASSOCIATED SYMPTOMS: "Is there any swelling or redness of the knee?"     Mild swelling 9. OTHER SYMPTOMS: "Do you have any other symptoms?" (e.g., chest  pain, difficulty breathing, fever, calf pain)    Fell , "Went down on knees this W/E and this AM". No injuries  Protocols used: KNEE PAIN-A-AH

## 2018-11-21 NOTE — Progress Notes (Signed)
[name] [place], [place] [address] Phone: [phone] Subjective:    I [name] am serving as a Education administrator for Dr. Hulan Saas.    CC: Bilateral knee pain  LGX:[id number]    09/28/2018 Bilateral viscosupplementation given  Patient will need to be started considering the possibility of a knee replacement in the near future.  Updated 11/22/2018  [name] is a 73 y.o. female coming in with complaint of bilateral knee pain.  Found to have a definite severe bone-on-bone osteoarthritic changes of the knees.  Patient was given Visco supplementation 2 months ago.  Patient states that she fell 3 times one being yesterday. Knees are very pain. States the falls were due to cramping in her legs. Would like to get in home therapy.  Patient at this time is having worsening pain that is affecting daily activities.     Past Medical History:  Diagnosis Date  . Allergic rhinitis, cause unspecified   . Allergy    SEASONAL  . Anemia   . Chronic headaches   . Diverticulosis   . DJD (degenerative joint disease), lumbar   . GERD (gastroesophageal reflux disease)   . Hemorrhoids   . Hiatal hernia   . HTN (hypertension)   . Hyperlipidemia   . Obesity   . Osteoarthritis   . Tubular adenoma of colon    Past Surgical History:  Procedure Laterality Date  . ABLATION    . COLONOSCOPY    . TUBAL LIGATION     Social History   Socioeconomic History  . Marital status: Widowed    Spouse name: Not on file  . Number of children: 3  . Years of education: 71  . Highest education level: Not on file  Occupational History  . Occupation: retired    Fish farm manager: UNEMPLOYED  Social Needs  . Financial resource strain: Not hard at all  . Food insecurity:    Worry: Never true    Inability: Never true  . Transportation needs:    Medical: No    Non-medical: No  Tobacco Use  . Smoking status: Never Smoker  . Smokeless tobacco: Never Used    Substance and Sexual Activity  . Alcohol use: No    Alcohol/week: 0.0 standard drinks  . Drug use: No  . Sexual activity: Never  Lifestyle  . Physical activity:    Days per week: 3 days    Minutes per session: 50 min  . Stress: Not at all  Relationships  . Social connections:    Talks on phone: More than three times a week    Gets together: More than three times a week    Attends religious service: More than 4 times per year    Active member of club or organization: Yes    Attends meetings of clubs or organizations: More than 4 times per year    Relationship status: Widowed  Other Topics Concern  . Not on file  Social History Narrative  . Not on file   Allergies  Allergen Reactions  . 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 Sulfonamide Derivatives Itching and Rash   Family History  Problem Relation Age of Onset  . Heart disease Mother   . Hypertension Mother   . Diabetes Brother   . Breast cancer Sister   . Colon cancer Neg Hx   . Esophageal cancer Neg  Hx   . Rectal cancer Neg Hx   . Stomach cancer Neg Hx      Current Outpatient Medications (Cardiovascular):  .  atorvastatin (LIPITOR) 20 MG tablet, TAKE 1 TABLET EVERY DAY .  diltiazem (TIAZAC) 120 MG 24 hr capsule, TAKE 1 CAPSULE EVERY DAY .  losartan-hydrochlorothiazide (HYZAAR) 100-25 MG tablet, TAKE 1 TABLET EVERY DAY .  metoprolol tartrate (LOPRESSOR) 50 MG tablet, TAKE 1 TABLET TWICE DAILY  Current Outpatient Medications (Respiratory):  .  cetirizine (ZYRTEC) 10 MG tablet, Take 1 tablet (10 mg total) by mouth daily. As needed  Current Outpatient Medications (Analgesics):  .  traMADol (ULTRAM) 50 MG tablet, Take 1 tablet by mouth every 6 (six) hours as needed.   Current Outpatient Medications (Other):  Marland Kitchen  AMBULATORY NON FORMULARY MEDICATION, 4 prong cane .  Black Cohosh 80 MG CAPS, Take by mouth. .  Cholecalciferol (VITAMIN  D3) 2000 UNITS capsule, Take 2,000 Units by mouth daily. .  DULoxetine (CYMBALTA) 20 MG capsule, TAKE 2 CAPSULES BY MOUTH EVERY DAY .  esomeprazole (NEXIUM) 40 MG capsule, Take 1 capsule (40 mg total) by mouth daily. .  ranitidine (ZANTAC) 150 MG tablet, Take 1 tablet every evening as needed .  sucralfate (CARAFATE) 1 G tablet, Take 1 g by mouth as needed. Reported on 12/20/2015 .  tiZANidine (ZANAFLEX) 4 MG capsule, Take 8 mg by mouth daily. Marland Kitchen  triamcinolone ointment (KENALOG) 0.5 %, Apply 1 application topically 2 (two) times daily as needed. .  Turmeric 500 MG CAPS, Take 1 capsule by mouth daily. Marland Kitchen  gabapentin (NEURONTIN) 100 MG capsule, Take 2 capsules (200 mg total) by mouth at bedtime.    Past medical history, social, surgical and family history all reviewed in electronic medical record.  No pertanent information unless stated regarding to the chief complaint.   Review of Systems:  No headache, visual changes, nausea, vomiting, diarrhea, constipation, dizziness, abdominal pain, skin rash, fevers, chills, night sweats, weight loss, swollen lymph nodes, body aches,, chest pain, shortness of breath, mood changes.  Positive muscle aches and joint swelling  Objective  Blood pressure 130/80, pulse 63, height 5\' 3"  (1.6 m), weight 211 lb (95.7 kg), SpO2 98 %.   General: No apparent distress alert and oriented x3 mood and affect normal, dressed appropriately.  HEENT: Pupils equal, extraocular movements intact  Respiratory: Patient's speak in full sentences and does not appear short of breath  Cardiovascular: Trace lower extremity edema, non tender, no erythema  Skin: Warm dry intact with no signs of infection or rash on extremities or on axial skeleton.  Abdomen: Soft nontender  Neuro: Cranial nerves II through XII are intact, neurovascularly intact in all extremities with 2+ DTRs and 2+ pulses.  Lymph: No lymphadenopathy of posterior or anterior cervical chain or axillae bilaterally.  Gait  severely antalgic MSK: Arthritic changes of multiple joints.  Knee: Bilateral valgus deformity noted.  Abnormal thigh to calf ratio.  Tender to palpation over medial and PF joint line.  Worsening range of motion lacking last 5 degrees of extension bilaterally and lacks last 10 degrees of flexion on the right side. instability with valgus force.  painful patellar compression. Patellar glide with moderate crepitus. Patellar and quadriceps tendons unremarkable. Hamstring and quadriceps strength is normal.   After informed written and verbal consent, patient was seated on exam table. Right knee was prepped with alcohol swab and utilizing anterolateral approach, patient's right knee space was injected with 4:1  marcaine 0.5%: Kenalog 40mg /dL. Patient tolerated  the procedure well without immediate complications.  After informed written and verbal consent, patient was seated on exam table. Left knee was prepped with alcohol swab and utilizing anterolateral approach, patient's left knee space was injected with 4:1  marcaine 0.5%: Kenalog 105m/dL. Patient tolerated the procedure well without immediate complications.   Impression and Recommendations:     This case required medical decision making of moderate complexity. The above documentation has been reviewed and is accurate and complete ZLyndal Pulley DO       Note: This dictation was prepared with Dragon dictation along with smaller phrase technology. Any transcriptional errors that result from this process are unintentional.

## 2018-11-22 ENCOUNTER — Encounter: Payer: Self-pay | Admitting: Family Medicine

## 2018-11-22 ENCOUNTER — Ambulatory Visit: Payer: Medicare Other | Admitting: Family Medicine

## 2018-11-22 VITALS — BP 130/80 | HR 63 | Ht 63.0 in | Wt 211.0 lb

## 2018-11-22 DIAGNOSIS — M170 Bilateral primary osteoarthritis of knee: Secondary | ICD-10-CM

## 2018-11-22 DIAGNOSIS — M25561 Pain in right knee: Secondary | ICD-10-CM

## 2018-11-22 DIAGNOSIS — M25562 Pain in left knee: Secondary | ICD-10-CM

## 2018-11-22 DIAGNOSIS — G8929 Other chronic pain: Secondary | ICD-10-CM

## 2018-11-22 MED ORDER — GABAPENTIN 100 MG PO CAPS: 200.0000 mg | ORAL_CAPSULE | Freq: Every day | ORAL | 3 refills | Status: DC

## 2018-11-22 NOTE — Assessment & Plan Note (Signed)
Steroid injections given been for temporary relief.  Discussed with patient about mild considering surgical intervention and patient has been referred.  Patient would do fairly well with a replacement.  Patient quality of life is significantly decreased and is failed all other conservative therapy.  Referred to orthopedic surgery

## 2018-11-22 NOTE — Patient Instructions (Addendum)
Ice is your friend Injected both knees.  Sending you to Dr. Berenice Primas to discuss replacements.  [facility] [address]  [phone] They should call you soon  See me again in 2 months

## 2018-11-25 ENCOUNTER — Other Ambulatory Visit: Payer: Self-pay

## 2018-11-25 MED ORDER — DILTIAZEM HCL ER BEADS 120 MG PO CP24: 120.0000 mg | ORAL_CAPSULE | Freq: Every day | ORAL | 1 refills | Status: DC

## 2018-11-25 MED ORDER — METOPROLOL TARTRATE 50 MG PO TABS: 50.0000 mg | ORAL_TABLET | Freq: Two times a day (BID) | ORAL | 1 refills | Status: DC

## 2018-11-25 MED ORDER — ATORVASTATIN CALCIUM 20 MG PO TABS: 20.0000 mg | ORAL_TABLET | Freq: Every day | ORAL | 1 refills | Status: DC

## 2018-11-25 MED ORDER — LOSARTAN POTASSIUM-HCTZ 100-25 MG PO TABS: 1.0000 | ORAL_TABLET | Freq: Every day | ORAL | 1 refills | Status: DC

## 2018-11-29 ENCOUNTER — Ambulatory Visit: Payer: Medicare HMO | Admitting: Family Medicine

## 2018-12-02 DIAGNOSIS — H1045 Other chronic allergic conjunctivitis: Secondary | ICD-10-CM | POA: Diagnosis not present

## 2018-12-02 DIAGNOSIS — H0102A Squamous blepharitis right eye, upper and lower eyelids: Secondary | ICD-10-CM | POA: Diagnosis not present

## 2018-12-02 DIAGNOSIS — H2513 Age-related nuclear cataract, bilateral: Secondary | ICD-10-CM | POA: Diagnosis not present

## 2018-12-02 DIAGNOSIS — H0102B Squamous blepharitis left eye, upper and lower eyelids: Secondary | ICD-10-CM | POA: Diagnosis not present

## 2018-12-02 DIAGNOSIS — H02423 Myogenic ptosis of bilateral eyelids: Secondary | ICD-10-CM | POA: Diagnosis not present

## 2018-12-02 DIAGNOSIS — R6889 Other general symptoms and signs: Secondary | ICD-10-CM | POA: Diagnosis not present

## 2018-12-06 DIAGNOSIS — M1711 Unilateral primary osteoarthritis, right knee: Secondary | ICD-10-CM | POA: Diagnosis not present

## 2018-12-06 DIAGNOSIS — R6889 Other general symptoms and signs: Secondary | ICD-10-CM | POA: Diagnosis not present

## 2018-12-06 DIAGNOSIS — M1712 Unilateral primary osteoarthritis, left knee: Secondary | ICD-10-CM | POA: Diagnosis not present

## 2018-12-15 ENCOUNTER — Other Ambulatory Visit: Payer: Self-pay | Admitting: Internal Medicine

## 2018-12-15 DIAGNOSIS — Z1231 Encounter for screening mammogram for malignant neoplasm of breast: Secondary | ICD-10-CM

## 2018-12-16 DIAGNOSIS — M48062 Spinal stenosis, lumbar region with neurogenic claudication: Secondary | ICD-10-CM | POA: Diagnosis not present

## 2018-12-17 ENCOUNTER — Other Ambulatory Visit: Payer: Self-pay | Admitting: Internal Medicine

## 2018-12-19 ENCOUNTER — Telehealth: Payer: Self-pay | Admitting: Internal Medicine

## 2018-12-19 NOTE — Telephone Encounter (Signed)
Primary Pulmonologist: CY Last office visit and with whom: 05/16/2018 with CY What do we see them for (pulmonary problems): seasonal allergic rhinitis Last OV assessment/plan: Script sent refilling the Astelin nasal spray to help with drainage.  You can combine this with Flonase/ fluticasone nasal spray if needed.  You can try otc Sudafed-PE to help head congestion and stuffiness.  Please let us know if you don't get better.  Was appointment offered to patient (explain)?  Yes, she only wants to see CY. Scheduled appt with CY 12/22/2018 at 1130am.  Reason for call: Pt has increase dry cough, runny nose, ear pian, headache. She is taking Zyrtek, nothing else OTC. Pt denies SOB, wheezing, fever or chills. Pt refuses to see NP, requests to only see CY. Only appt is this Thursday at 1130am. Nothing further needed.  (examples of things to ask: : When did symptoms start? Fever? Cough? Productive? Color to sputum? More sputum than usual? Wheezing? Have you needed increased oxygen? Are you taking your respiratory medications? What over the counter measures have you tried?)

## 2018-12-22 ENCOUNTER — Encounter: Payer: Self-pay | Admitting: Internal Medicine

## 2018-12-22 ENCOUNTER — Ambulatory Visit: Payer: Medicare Other | Admitting: Internal Medicine

## 2018-12-22 VITALS — BP 100/60 | HR 67 | Ht 63.0 in | Wt 212.2 lb

## 2018-12-22 DIAGNOSIS — J3089 Other allergic rhinitis: Secondary | ICD-10-CM

## 2018-12-22 DIAGNOSIS — J00 Acute nasopharyngitis [common cold]: Secondary | ICD-10-CM | POA: Diagnosis not present

## 2018-12-22 DIAGNOSIS — J302 Other seasonal allergic rhinitis: Secondary | ICD-10-CM | POA: Diagnosis not present

## 2018-12-22 DIAGNOSIS — H9209 Otalgia, unspecified ear: Secondary | ICD-10-CM | POA: Diagnosis not present

## 2018-12-22 DIAGNOSIS — H6982 Other specified disorders of Eustachian tube, left ear: Secondary | ICD-10-CM

## 2018-12-22 MED ORDER — PHENYLEPHRINE HCL 1 % NA SOLN
3.0000 [drp] | Freq: Once | NASAL | Status: AC
  Administered 2018-12-22: 3 [drp] via NASAL

## 2018-12-22 MED ORDER — METHYLPREDNISOLONE ACETATE 80 MG/ML IJ SUSP
80.0000 mg | Freq: Once | INTRAMUSCULAR | Status: AC
  Administered 2018-12-22: 80 mg via INTRAMUSCULAR

## 2018-12-22 NOTE — Patient Instructions (Signed)
Order- neb neo nasal    Dx acute rhinitis, otalgia              Depo 92  Ok to use your pain med if needed  Please call if we can help

## 2018-12-22 NOTE — Progress Notes (Signed)
HPI female never smoker followed for allergic rhinitis, recurrent sinusitis, food allergy Allergy Profile 09/18/2013-total IgE 48. Broadly positive for common allergens including aspergillus.  Food profile positive for peanut, soybean. She reports banana and melons make her throat itch.  -------------------------------------------------------------------------. 05/16/2018- 73 year old female never smoker followed for Allergic rhinitis, recurrent sinusitis, food allergy -----Sinusitis and Bronchitis: Pt states she has nasal drainage-when bring up from chest its yellow in color. Sinus headaches as well; stopped up nose at night. Denies any wheezing, cough, fever, or chills.  Typical summer for her-stuffy nose with some postnasal drip and stuffy ears.  Occasional cough, not daily.  No fever or sore throat and she does not feel she has an infection. CXR 07/26/2017- IMPRESSION: No active cardiopulmonary disease.  12/22/2018- 73 year old female never smoker followed for Allergic rhinitis, recurrent sinusitis, food allergy, GERD Has  Been getting steroid inj for OA knees, now referred to Ortho.  -----congestion, nasal drip, pain in throat and ear, denies SOB & chest tightness; had sweats Did well  for most of the winter.  In the last 2 weeks has had left ear ache, left temporal headache, scant nasal discharge.  No fever or sore throat.  Hearing okay.  Chest okay.  ROS-see HPI   + = positive Constitutional:   No-   weight loss, night sweats, fevers, chills, fatigue, lassitude. HEENT:   + headaches, no-difficulty swallowing, tooth/dental problems, sore throat,       No-  sneezing, itching, +ear ache, +nasal congestion, post nasal drip,  CV:  No-   chest pain, orthopnea, PND, swelling in lower extremities, anasarca, dizziness, palpitations Resp: No-   shortness of breath with exertion or at rest.              No-productive cough, +non-productive cough,  No- coughing up of blood.              No-   change  in color of mucus.  No- wheezing.   Skin: No-   rash or lesions. GI:  No-   heartburn, indigestion, abdominal pain, nausea, vomiting,  GU: . MS:  No-   joint pain or swelling.   Neuro-     nothing unusual Psych:  No- change in mood or affect. No depression or anxiety.  No memory loss.  OBJ- Physical Exam General- Alert, Oriented, Affect-appropriate, Distress- none acute. + Obese Skin- rash-none, lesions- none, excoriation- none Lymphadenopathy- none Head- atraumatic            Eyes- Gross vision intact, PERRLA, conjunctivae and secretions clear            Ears- Hearing, canals-normal, TMs are clear            Nose- clear, no-Septal dev, mucus, polyps, erosion, perforation, looks healthy            Throat- Mallampati III , mucosa clear , drainage- none, tonsils- atrophic, + missing teeth Neck- flexible , trachea midline, no stridor , thyroid nl, carotid no bruit Chest - symmetrical excursion , unlabored           Heart/CV- RRR , no murmur , no gallop  , no rub, nl s1 s2                           - JVD- none , edema- none, stasis changes- none, varices- none           Lung- clear to P&A, wheeze- none, cough- none, dullness-none, rub-  none           Chest wall-  Abd- Br/ Gen/ Rectal- Not done, not indicated Extrem- cyanosis- none, clubbing, none, atrophy- none, strength- nl Neuro- grossly intact to observation

## 2018-12-26 ENCOUNTER — Other Ambulatory Visit: Payer: Self-pay

## 2018-12-26 MED ORDER — DULOXETINE HCL 20 MG PO CPEP: 40.0000 mg | ORAL_CAPSULE | Freq: Every day | ORAL | 1 refills | Status: DC

## 2018-12-27 ENCOUNTER — Other Ambulatory Visit: Payer: Self-pay | Admitting: Orthopedic Surgery

## 2019-01-06 ENCOUNTER — Other Ambulatory Visit (HOSPITAL_COMMUNITY): Payer: Medicare Other

## 2019-01-06 ENCOUNTER — Inpatient Hospital Stay (HOSPITAL_COMMUNITY): Admission: RE | Admit: 2019-01-06 | Payer: Medicare Other | Source: Ambulatory Visit

## 2019-01-10 NOTE — Assessment & Plan Note (Signed)
Emphasized use of antihistamines and nasal sprays, environmental precautions

## 2019-01-10 NOTE — Assessment & Plan Note (Signed)
Not an obvious infection. May be seasonal allergy related Plan- neb nasal decongestant, depo medrol, astelin/ flonase

## 2019-01-13 ENCOUNTER — Encounter (HOSPITAL_COMMUNITY): Admission: RE | Payer: Self-pay | Source: Home / Self Care

## 2019-01-13 ENCOUNTER — Ambulatory Visit (HOSPITAL_COMMUNITY): Admission: RE | Admit: 2019-01-13 | Payer: Medicare Other | Source: Home / Self Care | Admitting: Orthopedic Surgery

## 2019-01-13 SURGERY — ARTHROPLASTY, KNEE, TOTAL
Anesthesia: Spinal | Laterality: Right

## 2019-01-19 ENCOUNTER — Other Ambulatory Visit: Payer: Self-pay | Admitting: Internal Medicine

## 2019-01-20 ENCOUNTER — Ambulatory Visit: Payer: Medicare Other | Admitting: Family Medicine

## 2019-01-23 ENCOUNTER — Ambulatory Visit: Payer: Medicare Other

## 2019-02-20 DIAGNOSIS — M48062 Spinal stenosis, lumbar region with neurogenic claudication: Secondary | ICD-10-CM | POA: Diagnosis not present

## 2019-02-28 ENCOUNTER — Other Ambulatory Visit: Payer: Self-pay

## 2019-02-28 NOTE — Patient Outreach (Signed)
[facility]) Care Management  02/28/2019  Sherina Stammer 14-Jul-1946 [id number]   Medication Adherence call to [name] Identifiers Verify spoke with patient she is due on Losartan/Hctz 100/25 and Atorvastatin 20 mg  Patient explain she is taking 1 tablet daily on both medication and has medication for about 5 more days patient ask if we can call [facility] an order this medication and also Metoprolol 50 mg from the mail order pharmacy. [facility] will mail all her medication with in 5-7 days. Mrs. Gaster is showing past due under [place].  [facility] Management Direct Dial [phone]  Fax [phone] [email or web address]

## 2019-03-16 ENCOUNTER — Other Ambulatory Visit: Payer: Self-pay | Admitting: Internal Medicine

## 2019-03-17 ENCOUNTER — Other Ambulatory Visit: Payer: Self-pay

## 2019-03-17 ENCOUNTER — Telehealth: Payer: Self-pay | Admitting: Internal Medicine

## 2019-03-17 MED ORDER — GABAPENTIN 100 MG PO CAPS: 200.0000 mg | ORAL_CAPSULE | Freq: Every day | ORAL | 3 refills | Status: DC

## 2019-03-17 NOTE — Telephone Encounter (Signed)
Copied from [place] [id number]. Topic: Quick Communication - Rx Refill/Question >> Mar 17, 2019 10:51 AM Percell Belt A wrote: Medication: gabapentin (NEURONTIN) 100 MG capsule [[id number]] - Dr Tamala Julian prescribes this for pt.  Her surgery had to be moved back due to the Covid.     Has the patient contacted their pharmacy? No. (Agent: If no, request that the patient contact the pharmacy for the refill.) (Agent: If yes, when and what did the pharmacy advise?) Preferred Pharmacy (with phone number or street name): [facility] [phone] - [place], [address] AT [place] [phone] (Phone)    Agent: Please be advised that RX refills may take up to 3 business days. We ask that you follow-up with your pharmacy.

## 2019-03-21 ENCOUNTER — Other Ambulatory Visit: Payer: Self-pay | Admitting: Orthopedic Surgery

## 2019-03-21 DIAGNOSIS — M1712 Unilateral primary osteoarthritis, left knee: Secondary | ICD-10-CM | POA: Diagnosis not present

## 2019-03-21 DIAGNOSIS — M1711 Unilateral primary osteoarthritis, right knee: Secondary | ICD-10-CM | POA: Diagnosis not present

## 2019-03-23 DIAGNOSIS — M48062 Spinal stenosis, lumbar region with neurogenic claudication: Secondary | ICD-10-CM | POA: Diagnosis not present

## 2019-03-25 ENCOUNTER — Other Ambulatory Visit: Payer: Self-pay | Admitting: Internal Medicine

## 2019-03-27 NOTE — Telephone Encounter (Signed)
Pt requested a refill for Nexium

## 2019-03-31 ENCOUNTER — Telehealth: Payer: Self-pay

## 2019-03-31 NOTE — Telephone Encounter (Signed)
Noted. Please schedule pt for a doxy.   Copied from [place] [phone] [id number]. Topic: Appointment Scheduling - Scheduling Inquiry for Clinic >> Mar 31, 2019 12:29 PM Scherrie Gerlach wrote: Reason for CRM: pt has CPE on 7/01.  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  Pt wants to cancel appt or do a virtual if Dr Jenny Reichmann needs to follow up with her.  Pt also has [place] on 8/12 that may need to be addressed as well. >> Mar 31, 2019  1:24 PM Melene Plan, CMA wrote: Is Dr Jenny Reichmann still doing CPEs virtual?

## 2019-04-05 NOTE — Telephone Encounter (Signed)
Spoke with pt to schedule appt.

## 2019-04-10 DIAGNOSIS — H2513 Age-related nuclear cataract, bilateral: Secondary | ICD-10-CM | POA: Diagnosis not present

## 2019-04-10 DIAGNOSIS — H02423 Myogenic ptosis of bilateral eyelids: Secondary | ICD-10-CM | POA: Diagnosis not present

## 2019-04-10 DIAGNOSIS — H0102B Squamous blepharitis left eye, upper and lower eyelids: Secondary | ICD-10-CM | POA: Diagnosis not present

## 2019-04-10 DIAGNOSIS — H1045 Other chronic allergic conjunctivitis: Secondary | ICD-10-CM | POA: Diagnosis not present

## 2019-04-10 DIAGNOSIS — H0102A Squamous blepharitis right eye, upper and lower eyelids: Secondary | ICD-10-CM | POA: Diagnosis not present

## 2019-04-10 NOTE — Patient Instructions (Signed)
[name]  04/10/2019   Your procedure is scheduled on: Friday 04/14/2019  Report to [facility] Main  Entrance              Report to admitting at  0950 AM               YOU NEED TO HAVE A COVID 19 TEST ON_______ @_______ , THIS TEST MUST BE DONE BEFORE SURGERY, COME TO [place].             ONCE YOUR COVID TEST IS COMPLETED, PLEASE BEGIN THE QUARANTINE INSTRUCTIONS AS OUTLINED IN YOUR HANDOUT.   Call this number if you have problems the morning of surgery [phone]    Remember: Do not eat food  :After Midnight.               NO SOLID FOOD AFTER MIDNIGHT THE NIGHT PRIOR TO SURGERY. NOTHING BY MOUTH EXCEPT CLEAR LIQUIDS UNTIL 3 HOURS PRIOR TO [place] SURGERY.              PLEASE FINISH ENSURE DRINK PER SURGEON ORDER 3 HOURS PRIOR TO SCHEDULED SURGERY TIME WHICH NEEDS TO BE COMPLETED AT  0920 am.   CLEAR LIQUID DIET   Foods Allowed                                                                     Foods Excluded  Coffee and tea, regular and decaf                             liquids that you cannot  Plain Jell-O in any flavor                                             see through such as: Fruit ices (not with fruit pulp)                                     milk, soups, orange juice  Iced Popsicles                                    All solid food Carbonated beverages, regular and diet                                    Cranberry, grape and apple juices Sports drinks like Gatorade Lightly seasoned clear broth or consume(fat free) Sugar, honey syrup  Sample Menu Breakfast                                Lunch  Supper Cranberry juice                    Beef broth                            Chicken broth Jell-O                                     Grape juice                           Apple juice Coffee or tea                        Jell-O                                       Popsicle                                                Coffee or tea                        Coffee or tea  _____________________________________________________________________               BRUSH YOUR TEETH MORNING OF SURGERY AND RINSE YOUR MOUTH OUT, NO CHEWING GUM CANDY OR MINTS.     Take these medicines the morning of surgery with A SIP OF WATER: Diltiazem (Tiazac), Esomeprazole (Nexium), Duloxetine (Cymbalta), Metoprolol tartrate (Lopressor), Use Astelin nasal spray                                 You may not have any metal on your body including hair pins and              piercings  Do not wear jewelry, make-up, lotions, powders or perfumes, deodorant             Do not wear nail polish.  Do not shave  48 hours prior to surgery.                 Do not bring valuables to the hospital. [place].  Contacts, dentures or bridgework may not be worn into surgery.  Leave suitcase in the car. After surgery it may be brought to your room.                  Please read over the following fact sheets you were given: _____________________________________________________________________             [facility] - Preparing for Surgery Before surgery, you can play an important role.  Because skin is not sterile, your skin needs to be as free of germs as possible.  You can reduce the number of germs on your skin by washing with CHG (chlorahexidine gluconate) soap before surgery.  CHG is an antiseptic cleaner which kills germs and bonds with the skin to continue killing germs  even after washing. Please DO NOT use if you have an allergy to CHG or antibacterial soaps.  If your skin becomes reddened/irritated stop using the CHG and inform your nurse when you arrive at Short Stay. Do not shave (including legs and underarms) for at least 48 hours prior to the first CHG shower.  You may shave your face/neck. Please follow these instructions  carefully:  1.  Shower with CHG Soap the night before surgery and the  morning of Surgery.  2.  If you choose to wash your hair, wash your hair first as usual with your  normal  shampoo.  3.  After you shampoo, rinse your hair and body thoroughly to remove the  shampoo.                           4.  Use CHG as you would any other liquid soap.  You can apply chg directly  to the skin and wash                       Gently with a scrungie or clean washcloth.  5.  Apply the CHG Soap to your body ONLY FROM THE NECK DOWN.   Do not use on face/ open                           Wound or open sores. Avoid contact with eyes, ears mouth and genitals (private parts).                       Wash face,  Genitals (private parts) with your normal soap.             6.  Wash thoroughly, paying special attention to the area where your surgery  will be performed.  7.  Thoroughly rinse your body with warm water from the neck down.  8.  DO NOT shower/wash with your normal soap after using and rinsing off  the CHG Soap.                9.  Pat yourself dry with a clean towel.            10.  Wear clean pajamas.            11.  Place clean sheets on your bed the night of your first shower and do not  sleep with pets. Day of Surgery : Do not apply any lotions/deodorants the morning of surgery.  Please wear clean clothes to the hospital/surgery center.  FAILURE TO FOLLOW THESE INSTRUCTIONS MAY RESULT IN THE CANCELLATION OF YOUR SURGERY PATIENT SIGNATURE_________________________________  NURSE SIGNATURE__________________________________  ________________________________________________________________________   [name]  An incentive spirometer is a tool that can help keep your lungs clear and active. This tool measures how well you are filling your lungs with each breath. Taking long deep breaths may help reverse or decrease the chance of developing breathing (pulmonary) problems (especially infection)  following:  A long period of time when you are unable to move or be active. BEFORE THE PROCEDURE   If the spirometer includes an indicator to show your best effort, your nurse or respiratory therapist will set it to a desired goal.  If possible, sit up straight or lean slightly forward. Try not to slouch.  Hold the incentive spirometer in an upright position. INSTRUCTIONS FOR USE  1. Sit on the  edge of your bed if possible, or sit up as far as you can in bed or on a chair. 2. Hold the incentive spirometer in an upright position. 3. Breathe out normally. 4. Place the mouthpiece in your mouth and seal your lips tightly around it. 5. Breathe in slowly and as deeply as possible, raising the piston or the ball toward the top of the column. 6. Hold your breath for 3-5 seconds or for as long as possible. Allow the piston or ball to fall to the bottom of the column. 7. Remove the mouthpiece from your mouth and breathe out normally. 8. Rest for a few seconds and repeat Steps 1 through 7 at least 10 times every 1-2 hours when you are awake. Take your time and take a few normal breaths between deep breaths. 9. The spirometer may include an indicator to show your best effort. Use the indicator as a goal to work toward during each repetition. 10. After each set of 10 deep breaths, practice coughing to be sure your lungs are clear. If you have an incision (the cut made at the time of surgery), support your incision when coughing by placing a pillow or rolled up towels firmly against it. Once you are able to get out of bed, walk around indoors and cough well. You may stop using the incentive spirometer when instructed by your caregiver.  RISKS AND COMPLICATIONS  Take your time so you do not get dizzy or light-headed.  If you are in pain, you may need to take or ask for pain medication before doing incentive spirometry. It is harder to take a deep breath if you are having pain. AFTER USE  Rest and  breathe slowly and easily.  It can be helpful to keep track of a log of your progress. Your caregiver can provide you with a simple table to help with this. If you are using the spirometer at home, follow these instructions: [place] IF:   You are having difficultly using the spirometer.  You have trouble using the spirometer as often as instructed.  Your pain medication is not giving enough relief while using the spirometer.  You develop fever of 100.5 F (38.1 C) or higher. SEEK IMMEDIATE MEDICAL CARE IF:   You cough up bloody sputum that had not been present before.  You develop fever of 102 F (38.9 C) or greater.  You develop worsening pain at or near the incision site. MAKE SURE YOU:   Understand these instructions.  Will watch your condition.  Will get help right away if you are not doing well or get worse. Document Released: 02/15/2007 Document Revised: 12/28/2011 Document Reviewed: 04/18/2007 [facility] Patient Information 2014 [place], [place].   ________________________________________________________________________

## 2019-04-11 ENCOUNTER — Encounter (HOSPITAL_COMMUNITY)
Admission: RE | Admit: 2019-04-11 | Discharge: 2019-04-11 | Disposition: A | Discharge status: Home / Self Care | Payer: Medicare Other | Source: Ambulatory Visit | Attending: Orthopedic Surgery | Admitting: Orthopedic Surgery

## 2019-04-12 ENCOUNTER — Encounter (HOSPITAL_COMMUNITY)
Admission: RE | Admit: 2019-04-12 | Discharge: 2019-04-12 | Disposition: A | Discharge status: Home / Self Care | Payer: Medicare Other | Source: Ambulatory Visit | Attending: Orthopedic Surgery | Admitting: Orthopedic Surgery

## 2019-04-12 ENCOUNTER — Other Ambulatory Visit: Payer: Self-pay

## 2019-04-12 ENCOUNTER — Ambulatory Visit (HOSPITAL_COMMUNITY)
Admission: RE | Admit: 2019-04-12 | Discharge: 2019-04-12 | Disposition: A | Discharge status: Home / Self Care | Payer: Medicare Other | Source: Ambulatory Visit | Attending: Orthopedic Surgery | Admitting: Orthopedic Surgery

## 2019-04-12 ENCOUNTER — Encounter (HOSPITAL_COMMUNITY): Payer: Self-pay

## 2019-04-12 ENCOUNTER — Other Ambulatory Visit (HOSPITAL_COMMUNITY)
Admission: RE | Admit: 2019-04-12 | Discharge: 2019-04-12 | Disposition: A | Discharge status: Home / Self Care | Payer: Medicare Other | Source: Ambulatory Visit | Attending: Orthopedic Surgery | Admitting: Orthopedic Surgery

## 2019-04-12 DIAGNOSIS — M1711 Unilateral primary osteoarthritis, right knee: Secondary | ICD-10-CM | POA: Insufficient documentation

## 2019-04-12 DIAGNOSIS — Z01818 Encounter for other preprocedural examination: Secondary | ICD-10-CM | POA: Insufficient documentation

## 2019-04-12 DIAGNOSIS — E785 Hyperlipidemia, unspecified: Secondary | ICD-10-CM | POA: Diagnosis not present

## 2019-04-12 DIAGNOSIS — K219 Gastro-esophageal reflux disease without esophagitis: Secondary | ICD-10-CM | POA: Diagnosis not present

## 2019-04-12 DIAGNOSIS — Z1159 Encounter for screening for other viral diseases: Secondary | ICD-10-CM | POA: Diagnosis not present

## 2019-04-12 DIAGNOSIS — I10 Essential (primary) hypertension: Secondary | ICD-10-CM | POA: Diagnosis not present

## 2019-04-12 DIAGNOSIS — Z79899 Other long term (current) drug therapy: Secondary | ICD-10-CM | POA: Insufficient documentation

## 2019-04-12 DIAGNOSIS — Z01811 Encounter for preprocedural respiratory examination: Secondary | ICD-10-CM

## 2019-04-12 HISTORY — DX: Unspecified cataract: H26.9

## 2019-04-12 HISTORY — DX: Constipation, unspecified: K59.00

## 2019-04-12 LAB — COMPREHENSIVE METABOLIC PANEL
ALT: 20 U/L (ref 0–44)
AST: 16 U/L (ref 15–41)
Albumin: 3.7 g/dL (ref 3.5–5.0)
Alkaline Phosphatase: 82 U/L (ref 38–126)
Anion gap: 10 (ref 5–15)
BUN: 25 mg/dL — ABNORMAL HIGH (ref 8–23)
CO2: 29 mmol/L (ref 22–32)
Calcium: 9.4 mg/dL (ref 8.9–10.3)
Chloride: 101 mmol/L (ref 98–111)
Creatinine, Ser: 0.64 mg/dL (ref 0.44–1.00)
GFR calc Af Amer: >60 mL/min (ref >60)
GFR calc non Af Amer: >60 mL/min (ref >60)
Glucose, Bld: 107 mg/dL — ABNORMAL HIGH (ref 70–99)
Potassium: 3.3 mmol/L — ABNORMAL LOW (ref 3.5–5.1)
Sodium: 140 mmol/L (ref 135–145)
Total Bilirubin: 0.7 mg/dL (ref 0.3–1.2)
Total Protein: 6.9 g/dL (ref 6.5–8.1)

## 2019-04-12 LAB — URINALYSIS, ROUTINE W REFLEX MICROSCOPIC
Bilirubin Urine: NEGATIVE
Glucose, UA: NEGATIVE mg/dL
Hgb urine dipstick: NEGATIVE
Ketones, ur: NEGATIVE mg/dL
Leukocytes,Ua: NEGATIVE
Nitrite: NEGATIVE
Protein, ur: NEGATIVE mg/dL
Specific Gravity, Urine: 1.02 (ref 1.005–1.030)
pH: 6 (ref 5.0–8.0)

## 2019-04-12 LAB — CBC WITH DIFFERENTIAL/PLATELET
Abs Immature Granulocytes: 0.06 10*3/uL (ref 0.00–0.07)
Basophils Absolute: 0.1 10*3/uL (ref 0.0–0.1)
Basophils Relative: 1 %
Eosinophils Absolute: 0.4 10*3/uL (ref 0.0–0.5)
Eosinophils Relative: 4 %
HCT: 39 % (ref 36.0–46.0)
Hemoglobin: 12.7 g/dL (ref 12.0–15.0)
Immature Granulocytes: 1 %
Lymphocytes Relative: 22 %
Lymphs Abs: 2.1 10*3/uL (ref 0.7–4.0)
MCH: 30.5 pg (ref 26.0–34.0)
MCHC: 32.6 g/dL (ref 30.0–36.0)
MCV: 93.5 fL (ref 80.0–100.0)
Monocytes Absolute: 0.8 10*3/uL (ref 0.1–1.0)
Monocytes Relative: 9 %
Neutro Abs: 6.2 10*3/uL (ref 1.7–7.7)
Neutrophils Relative %: 63 %
Platelets: 313 10*3/uL (ref 150–400)
RBC: 4.17 MIL/uL (ref 3.87–5.11)
RDW: 12.3 % (ref 11.5–15.5)
WBC: 9.6 10*3/uL (ref 4.0–10.5)
nRBC: 0 % (ref 0.0–0.2)

## 2019-04-12 LAB — SARS CORONAVIRUS 2 (TAT 6-24 HRS): SARS Coronavirus 2: NEGATIVE

## 2019-04-12 LAB — SURGICAL PCR SCREEN
MRSA, PCR: NEGATIVE
Staphylococcus aureus: NEGATIVE

## 2019-04-12 LAB — APTT: aPTT: 25 seconds (ref 24–36)

## 2019-04-12 LAB — PROTIME-INR
INR: 0.9 (ref 0.8–1.2)
Prothrombin Time: 11.8 seconds (ref 11.4–15.2)

## 2019-04-12 LAB — ABO/RH: ABO/RH(D): A POS

## 2019-04-12 NOTE — Patient Outreach (Signed)
[facility]) Care Management  04/12/2019  [name] 03/05/1946 [id number]   Medication Adherence call to [name] [employer] spoke with patient she is showing past due on Atorvastatin 20 mg patient explain she received this medication last month when we order two other medications from [facility]. [name] is showing past due under [place].   [facility] Management Direct Dial [phone]  Fax [phone] [name]

## 2019-04-12 NOTE — Patient Instructions (Addendum)
DUE TO COVID-19 NO VISITORS ARE ALLOWED IN THE HOSPITAL AT THIS TIME   COVID SWAB TESTING MUST BE COMPLETED ON: Today, immediately after pre op appointment (Must self quarantine after testing. Follow instructions on handout.)   Your procedure is scheduled on: Friday, April 14, 2019   Surgery Time:  12:21PM-2:14PM   Report to [facility]  Entrance    Report to admitting at 0950 AM   Call this number if you have problems the morning of surgery [phone]   Do not eat food:After Midnight.   May have liquids until 4:30AM day of surgery   CLEAR LIQUID DIET  Foods Allowed                                                                     Foods Excluded  Water, Black Coffee and tea, regular and decaf                             liquids that you cannot  Plain Jell-O in any flavor                                             see through such as: Fruit ices (not with fruit pulp)                                     milk, soups, orange juice  Iced Popsicles                                    All solid food Carbonated beverages, regular and diet                                    Cranberry, grape and apple juices Sports drinks like Gatorade Lightly seasoned clear broth or consume(fat free) Sugar, honey syrup  Sample Menu Breakfast                                Lunch                                     Supper Cranberry juice                    Beef broth                            Chicken broth Jell-O                                     Grape juice  Apple juice Coffee or tea                        Jell-O                                      Popsicle                                                Coffee or tea                        Coffee or tea   Complete one Ensure drink the morning of surgery at 4:30AM the day of surgery.   Brush your teeth the morning of surgery.   Do NOT smoke after Midnight   Take these medicines the morning of surgery  with A SIP OF WATER: Dilitiazem, Esomeprazole, Duloxetine, Metoprolol, Cetirizine if needed   May use Nasal spray per normal routine              You may not have any metal on your body including hair pins, jewelry, and body piercings             Do not wear make-up, lotions, powders, perfumes/cologne, or deodorant             Do not wear nail polish.  Do not shave  48 hours prior to surgery.               Do not bring valuables to the hospital. [place].   Contacts, dentures or bridgework may not be worn into surgery.   Bring small overnight bag day of surgery.    Special Instructions: Bring a copy of your healthcare power of attorney and living will documents         the day of surgery if you haven't scanned them in before.              Please read over the following fact sheets you were given:  [facility] - Preparing for Surgery Before surgery, you can play an important role.  Because skin is not sterile, your skin needs to be as free of germs as possible.  You can reduce the number of germs on your skin by washing with CHG (chlorahexidine gluconate) soap before surgery.  CHG is an antiseptic cleaner which kills germs and bonds with the skin to continue killing germs even after washing. Please DO NOT use if you have an allergy to CHG or antibacterial soaps.  If your skin becomes reddened/irritated stop using the CHG and inform your nurse when you arrive at Short Stay. Do not shave (including legs and underarms) for at least 48 hours prior to the first CHG shower.  You may shave your face/neck.  Please follow these instructions carefully:  1.  Shower with CHG Soap the night before surgery and the  morning of surgery.  2.  If you choose to wash your hair, wash your hair first as usual with your normal  shampoo.  3.  After you shampoo, rinse your hair and body thoroughly to remove the shampoo.  4.  Use CHG as  you would any other liquid soap.  You can apply chg directly to the skin and wash.  Gently with a scrungie or clean washcloth.  5.  Apply the CHG Soap to your body ONLY FROM THE NECK DOWN.   Do   not use on face/ open                           Wound or open sores. Avoid contact with eyes, ears mouth and   genitals (private parts).                       Wash face,  Genitals (private parts) with your normal soap.             6.  Wash thoroughly, paying special attention to the area where your    surgery  will be performed.  7.  Thoroughly rinse your body with warm water from the neck down.  8.  DO NOT shower/wash with your normal soap after using and rinsing off the CHG Soap.                9.  Pat yourself dry with a clean towel.            10.  Wear clean pajamas.            11.  Place clean sheets on your bed the night of your first shower and do not  sleep with pets. Day of Surgery : Do not apply any lotions/deodorants the morning of surgery.  Please wear clean clothes to the hospital/surgery center.  FAILURE TO FOLLOW THESE INSTRUCTIONS MAY RESULT IN THE CANCELLATION OF YOUR SURGERY  PATIENT SIGNATURE_________________________________  NURSE SIGNATURE__________________________________  ________________________________________________________________________   [name]  An incentive spirometer is a tool that can help keep your lungs clear and active. This tool measures how well you are filling your lungs with each breath. Taking long deep breaths may help reverse or decrease the chance of developing breathing (pulmonary) problems (especially infection) following:  A long period of time when you are unable to move or be active. BEFORE THE PROCEDURE   If the spirometer includes an indicator to show your best effort, your nurse or respiratory therapist will set it to a desired goal.  If possible, sit up straight or lean slightly forward. Try not to slouch.  Hold the  incentive spirometer in an upright position. INSTRUCTIONS FOR USE  1. Sit on the edge of your bed if possible, or sit up as far as you can in bed or on a chair. 2. Hold the incentive spirometer in an upright position. 3. Breathe out normally. 4. Place the mouthpiece in your mouth and seal your lips tightly around it. 5. Breathe in slowly and as deeply as possible, raising the piston or the ball toward the top of the column. 6. Hold your breath for 3-5 seconds or for as long as possible. Allow the piston or ball to fall to the bottom of the column. 7. Remove the mouthpiece from your mouth and breathe out normally. 8. Rest for a few seconds and repeat Steps 1 through 7 at least 10 times every 1-2 hours when you are awake. Take your time and take a few normal breaths between deep breaths. 9. The spirometer may include an indicator to show your best effort. Use the indicator as a goal to work toward during each  repetition. 10. After each set of 10 deep breaths, practice coughing to be sure your lungs are clear. If you have an incision (the cut made at the time of surgery), support your incision when coughing by placing a pillow or rolled up towels firmly against it. Once you are able to get out of bed, walk around indoors and cough well. You may stop using the incentive spirometer when instructed by your caregiver.  RISKS AND COMPLICATIONS  Take your time so you do not get dizzy or light-headed.  If you are in pain, you may need to take or ask for pain medication before doing incentive spirometry. It is harder to take a deep breath if you are having pain. AFTER USE  Rest and breathe slowly and easily.  It can be helpful to keep track of a log of your progress. Your caregiver can provide you with a simple table to help with this. If you are using the spirometer at home, follow these instructions: [place] IF:   You are having difficultly using the spirometer.  You have trouble using  the spirometer as often as instructed.  Your pain medication is not giving enough relief while using the spirometer.  You develop fever of 100.5 F (38.1 C) or higher. SEEK IMMEDIATE MEDICAL CARE IF:   You cough up bloody sputum that had not been present before.  You develop fever of 102 F (38.9 C) or greater.  You develop worsening pain at or near the incision site. MAKE SURE YOU:   Understand these instructions.  Will watch your condition.  Will get help right away if you are not doing well or get worse. Document Released: 02/15/2007 Document Revised: 12/28/2011 Document Reviewed: 04/18/2007 [employer] Patient Information 2014 [place], [place].   ________________________________________________________________________  WHAT IS A BLOOD TRANSFUSION? Blood Transfusion Information  A transfusion is the replacement of blood or some of its parts. Blood is made up of multiple cells which provide different functions.  Red blood cells carry oxygen and are used for blood loss replacement.  White blood cells fight against infection.  Platelets control bleeding.  Plasma helps clot blood.  Other blood products are available for specialized needs, such as hemophilia or other clotting disorders. BEFORE THE TRANSFUSION  Who gives blood for transfusions?   Healthy volunteers who are fully evaluated to make sure their blood is safe. This is blood bank blood. Transfusion therapy is the safest it has ever been in the practice of medicine. Before blood is taken from a donor, a complete history is taken to make sure that person has no history of diseases nor engages in risky social behavior (examples are intravenous drug use or sexual activity with multiple partners). The donor's travel history is screened to minimize risk of transmitting infections, such as malaria. The donated blood is tested for signs of infectious diseases, such as HIV and hepatitis. The blood is then tested to be sure it  is compatible with you in order to minimize the chance of a transfusion reaction. If you or a relative donates blood, this is often done in anticipation of surgery and is not appropriate for emergency situations. It takes many days to process the donated blood. RISKS AND COMPLICATIONS Although transfusion therapy is very safe and saves many lives, the main dangers of transfusion include:   Getting an infectious disease.  Developing a transfusion reaction. This is an allergic reaction to something in the blood you were given. Every precaution is taken to prevent this.  The decision to have a blood transfusion has been considered carefully by your caregiver before blood is given. Blood is not given unless the benefits outweigh the risks. AFTER THE TRANSFUSION  Right after receiving a blood transfusion, you will usually feel much better and more energetic. This is especially true if your red blood cells have gotten low (anemic). The transfusion raises the level of the red blood cells which carry oxygen, and this usually causes an energy increase.  The nurse administering the transfusion will monitor you carefully for complications. HOME CARE INSTRUCTIONS  No special instructions are needed after a transfusion. You may find your energy is better. Speak with your caregiver about any limitations on activity for underlying diseases you may have. SEEK MEDICAL CARE IF:   Your condition is not improving after your transfusion.  You develop redness or irritation at the intravenous (IV) site. SEEK IMMEDIATE MEDICAL CARE IF:  Any of the following symptoms occur over the next 12 hours:  Shaking chills.  You have a temperature by mouth above 102 F (38.9 C), not controlled by medicine.  Chest, back, or muscle pain.  People around you feel you are not acting correctly or are confused.  Shortness of breath or difficulty breathing.  Dizziness and fainting.  You get a rash or develop hives.  You  have a decrease in urine output.  Your urine turns a dark color or changes to pink, red, or brown. Any of the following symptoms occur over the next 10 days:  You have a temperature by mouth above 102 F (38.9 C), not controlled by medicine.  Shortness of breath.  Weakness after normal activity.  The white part of the eye turns yellow (jaundice).  You have a decrease in the amount of urine or are urinating less often.  Your urine turns a dark color or changes to pink, red, or brown. Document Released: 10/02/2000 Document Revised: 12/28/2011 Document Reviewed: 05/21/2008 [facility] Patient Information 2014 [place], [place].  _______________________________________________________________________

## 2019-04-13 MED ORDER — BUPIVACAINE LIPOSOME 1.3 % IJ SUSP
20.0000 mL | Freq: Once | INTRAMUSCULAR | Status: DC
  Filled 2019-04-13: qty 20

## 2019-04-14 ENCOUNTER — Ambulatory Visit (HOSPITAL_COMMUNITY): Payer: Medicare Other | Admitting: Physician Assistant

## 2019-04-14 ENCOUNTER — Ambulatory Visit (HOSPITAL_COMMUNITY)
Admission: RE | Admit: 2019-04-14 | Discharge: 2019-04-17 | Disposition: A | Discharge status: Home / Self Care | Payer: Medicare Other | Source: Other Acute Inpatient Hospital | Attending: Orthopedic Surgery | Admitting: Orthopedic Surgery

## 2019-04-14 ENCOUNTER — Encounter (HOSPITAL_COMMUNITY)
Admission: RE | Disposition: A | Discharge status: Home / Self Care | Payer: Self-pay | Source: Other Acute Inpatient Hospital | Attending: Orthopedic Surgery

## 2019-04-14 ENCOUNTER — Other Ambulatory Visit: Payer: Self-pay

## 2019-04-14 ENCOUNTER — Encounter (HOSPITAL_COMMUNITY): Payer: Self-pay | Admitting: *Deleted

## 2019-04-14 ENCOUNTER — Ambulatory Visit (HOSPITAL_COMMUNITY): Payer: Medicare Other | Admitting: Anesthesiology

## 2019-04-14 DIAGNOSIS — I10 Essential (primary) hypertension: Secondary | ICD-10-CM | POA: Insufficient documentation

## 2019-04-14 DIAGNOSIS — M1711 Unilateral primary osteoarthritis, right knee: Secondary | ICD-10-CM | POA: Insufficient documentation

## 2019-04-14 DIAGNOSIS — R262 Difficulty in walking, not elsewhere classified: Secondary | ICD-10-CM | POA: Insufficient documentation

## 2019-04-14 DIAGNOSIS — E785 Hyperlipidemia, unspecified: Secondary | ICD-10-CM | POA: Insufficient documentation

## 2019-04-14 DIAGNOSIS — Z882 Allergy status to sulfonamides status: Secondary | ICD-10-CM | POA: Diagnosis not present

## 2019-04-14 DIAGNOSIS — Z79899 Other long term (current) drug therapy: Secondary | ICD-10-CM | POA: Insufficient documentation

## 2019-04-14 DIAGNOSIS — M170 Bilateral primary osteoarthritis of knee: Secondary | ICD-10-CM | POA: Diagnosis not present

## 2019-04-14 DIAGNOSIS — Z6838 Body mass index (BMI) 38.0-38.9, adult: Secondary | ICD-10-CM | POA: Insufficient documentation

## 2019-04-14 DIAGNOSIS — K219 Gastro-esophageal reflux disease without esophagitis: Secondary | ICD-10-CM | POA: Diagnosis not present

## 2019-04-14 DIAGNOSIS — Z8249 Family history of ischemic heart disease and other diseases of the circulatory system: Secondary | ICD-10-CM | POA: Insufficient documentation

## 2019-04-14 DIAGNOSIS — G8918 Other acute postprocedural pain: Secondary | ICD-10-CM | POA: Diagnosis not present

## 2019-04-14 HISTORY — PX: TOTAL KNEE ARTHROPLASTY: SHX125

## 2019-04-14 LAB — TYPE AND SCREEN
ABO/RH(D): A POS
Antibody Screen: NEGATIVE

## 2019-04-14 SURGERY — ARTHROPLASTY, KNEE, TOTAL
Anesthesia: Regional | Site: Knee | Laterality: Right

## 2019-04-14 MED ORDER — CLONIDINE HCL (ANALGESIA) 100 MCG/ML EP SOLN
EPIDURAL | Status: DC | PRN
  Administered 2019-04-14: 50 ug

## 2019-04-14 MED ORDER — BISACODYL 5 MG PO TBEC: 5.0000 mg | DELAYED_RELEASE_TABLET | Freq: Every day | ORAL | Status: DC | PRN

## 2019-04-14 MED ORDER — PROPOFOL 500 MG/50ML IV EMUL
INTRAVENOUS | Status: DC | PRN
  Administered 2019-04-14: 50 ug/kg/min via INTRAVENOUS

## 2019-04-14 MED ORDER — ATORVASTATIN CALCIUM 20 MG PO TABS
20.0000 mg | ORAL_TABLET | Freq: Every day | ORAL | Status: DC
  Administered 2019-04-15 – 2019-04-17 (×3): 20 mg via ORAL
  Filled 2019-04-14 (×3): qty 1

## 2019-04-14 MED ORDER — PANTOPRAZOLE SODIUM 40 MG PO TBEC
80.0000 mg | DELAYED_RELEASE_TABLET | Freq: Every day | ORAL | Status: DC
  Administered 2019-04-15 – 2019-04-16 (×2): 80 mg via ORAL
  Filled 2019-04-14 (×2): qty 2

## 2019-04-14 MED ORDER — ONDANSETRON HCL 4 MG/2ML IJ SOLN: 4.0000 mg | Freq: Four times a day (QID) | INTRAMUSCULAR | Status: DC | PRN

## 2019-04-14 MED ORDER — FENTANYL CITRATE (PF) 100 MCG/2ML IJ SOLN
INTRAMUSCULAR | Status: DC | PRN
  Administered 2019-04-14 (×2): 50 ug via INTRAVENOUS

## 2019-04-14 MED ORDER — LOSARTAN POTASSIUM-HCTZ 100-25 MG PO TABS: 1.0000 | ORAL_TABLET | Freq: Every day | ORAL | Status: DC

## 2019-04-14 MED ORDER — ACETAMINOPHEN 325 MG PO TABS
325.0000 mg | ORAL_TABLET | Freq: Four times a day (QID) | ORAL | Status: DC | PRN
  Administered 2019-04-15 – 2019-04-16 (×4): 650 mg via ORAL
  Filled 2019-04-14 (×4): qty 2

## 2019-04-14 MED ORDER — PROPOFOL 10 MG/ML IV BOLUS
INTRAVENOUS | Status: AC
  Filled 2019-04-14: qty 40

## 2019-04-14 MED ORDER — OXYCODONE-ACETAMINOPHEN 5-325 MG PO TABS: 1.0000 | ORAL_TABLET | Freq: Four times a day (QID) | ORAL | 0 refills | Status: DC | PRN

## 2019-04-14 MED ORDER — SODIUM CHLORIDE (PF) 0.9 % IJ SOLN
INTRAMUSCULAR | Status: AC
  Filled 2019-04-14: qty 50

## 2019-04-14 MED ORDER — SODIUM CHLORIDE 0.9 % IV SOLN
INTRAVENOUS | Status: DC
  Administered 2019-04-14: 100 mL/h via INTRAVENOUS

## 2019-04-14 MED ORDER — BUPIVACAINE-EPINEPHRINE (PF) 0.25% -1:200000 IJ SOLN
INTRAMUSCULAR | Status: DC | PRN
  Administered 2019-04-14: 30 mL

## 2019-04-14 MED ORDER — MIDAZOLAM HCL 2 MG/2ML IJ SOLN
1.0000 mg | INTRAMUSCULAR | Status: DC
  Filled 2019-04-14: qty 2

## 2019-04-14 MED ORDER — DOCUSATE SODIUM 100 MG PO CAPS
100.0000 mg | ORAL_CAPSULE | Freq: Two times a day (BID) | ORAL | Status: DC
  Administered 2019-04-14 – 2019-04-17 (×6): 100 mg via ORAL
  Filled 2019-04-14 (×6): qty 1

## 2019-04-14 MED ORDER — ONDANSETRON HCL 4 MG PO TABS: 4.0000 mg | ORAL_TABLET | Freq: Four times a day (QID) | ORAL | Status: DC | PRN

## 2019-04-14 MED ORDER — POVIDONE-IODINE 10 % EX SWAB
2.0000 "application " | Freq: Once | CUTANEOUS | Status: AC
  Administered 2019-04-14: 2 via TOPICAL

## 2019-04-14 MED ORDER — 0.9 % SODIUM CHLORIDE (POUR BTL) OPTIME
TOPICAL | Status: DC | PRN
  Administered 2019-04-14: 1000 mL

## 2019-04-14 MED ORDER — METHOCARBAMOL 500 MG PO TABS
500.0000 mg | ORAL_TABLET | Freq: Four times a day (QID) | ORAL | Status: DC | PRN
  Administered 2019-04-15: 500 mg via ORAL
  Filled 2019-04-14: qty 1

## 2019-04-14 MED ORDER — BUPIVACAINE-EPINEPHRINE (PF) 0.25% -1:200000 IJ SOLN
INTRAMUSCULAR | Status: AC
  Filled 2019-04-14: qty 30

## 2019-04-14 MED ORDER — METHOCARBAMOL 500 MG IVPB - SIMPLE MED
INTRAVENOUS | Status: AC
  Filled 2019-04-14: qty 50

## 2019-04-14 MED ORDER — LORATADINE 10 MG PO TABS
10.0000 mg | ORAL_TABLET | Freq: Every day | ORAL | Status: DC
  Administered 2019-04-14 – 2019-04-17 (×4): 10 mg via ORAL
  Filled 2019-04-14 (×4): qty 1

## 2019-04-14 MED ORDER — DEXAMETHASONE SODIUM PHOSPHATE 10 MG/ML IJ SOLN
10.0000 mg | Freq: Two times a day (BID) | INTRAMUSCULAR | Status: AC
  Administered 2019-04-14 – 2019-04-15 (×3): 10 mg via INTRAVENOUS
  Filled 2019-04-14 (×3): qty 1

## 2019-04-14 MED ORDER — ASPIRIN EC 325 MG PO TBEC: 325.0000 mg | DELAYED_RELEASE_TABLET | Freq: Two times a day (BID) | ORAL | 0 refills | Status: DC

## 2019-04-14 MED ORDER — METOPROLOL TARTRATE 50 MG PO TABS
50.0000 mg | ORAL_TABLET | Freq: Two times a day (BID) | ORAL | Status: DC
  Administered 2019-04-14 – 2019-04-17 (×6): 50 mg via ORAL
  Filled 2019-04-14 (×6): qty 1

## 2019-04-14 MED ORDER — LOSARTAN POTASSIUM 50 MG PO TABS
100.0000 mg | ORAL_TABLET | Freq: Every day | ORAL | Status: DC
  Administered 2019-04-15 – 2019-04-17 (×3): 100 mg via ORAL
  Filled 2019-04-14 (×3): qty 2

## 2019-04-14 MED ORDER — SODIUM CHLORIDE 0.9 % IR SOLN
Status: DC | PRN
  Administered 2019-04-14: 1000 mL

## 2019-04-14 MED ORDER — CEFAZOLIN SODIUM-DEXTROSE 2-4 GM/100ML-% IV SOLN
2.0000 g | Freq: Four times a day (QID) | INTRAVENOUS | Status: AC
  Administered 2019-04-14 – 2019-04-15 (×2): 2 g via INTRAVENOUS
  Filled 2019-04-14 (×2): qty 100

## 2019-04-14 MED ORDER — CHLORHEXIDINE GLUCONATE 4 % EX LIQD: 60.0000 mL | Freq: Once | CUTANEOUS | Status: DC

## 2019-04-14 MED ORDER — ASPIRIN EC 325 MG PO TBEC
325.0000 mg | DELAYED_RELEASE_TABLET | Freq: Two times a day (BID) | ORAL | Status: DC
  Administered 2019-04-15 – 2019-04-17 (×5): 325 mg via ORAL
  Filled 2019-04-14 (×5): qty 1

## 2019-04-14 MED ORDER — CEFAZOLIN SODIUM-DEXTROSE 2-4 GM/100ML-% IV SOLN
2.0000 g | INTRAVENOUS | Status: AC
  Administered 2019-04-14: 2 g via INTRAVENOUS
  Filled 2019-04-14: qty 100

## 2019-04-14 MED ORDER — WATER FOR IRRIGATION, STERILE IR SOLN
Status: DC | PRN
  Administered 2019-04-14: 2000 mL

## 2019-04-14 MED ORDER — ACETAMINOPHEN 500 MG PO TABS
1000.0000 mg | ORAL_TABLET | Freq: Once | ORAL | Status: AC
  Administered 2019-04-14: 1000 mg via ORAL

## 2019-04-14 MED ORDER — METHOCARBAMOL 500 MG IVPB - SIMPLE MED
500.0000 mg | Freq: Four times a day (QID) | INTRAVENOUS | Status: DC | PRN
  Administered 2019-04-14: 500 mg via INTRAVENOUS
  Filled 2019-04-14: qty 50

## 2019-04-14 MED ORDER — FENTANYL CITRATE (PF) 100 MCG/2ML IJ SOLN: 25.0000 ug | INTRAMUSCULAR | Status: DC | PRN

## 2019-04-14 MED ORDER — ALUM & MAG HYDROXIDE-SIMETH 200-200-20 MG/5ML PO SUSP: 30.0000 mL | ORAL | Status: DC | PRN

## 2019-04-14 MED ORDER — ACETAMINOPHEN 500 MG PO TABS
ORAL_TABLET | ORAL | Status: AC
  Filled 2019-04-14: qty 2

## 2019-04-14 MED ORDER — DILTIAZEM HCL ER BEADS 120 MG PO CP24
120.0000 mg | ORAL_CAPSULE | Freq: Every day | ORAL | Status: DC
  Administered 2019-04-15 – 2019-04-17 (×3): 120 mg via ORAL
  Filled 2019-04-14 (×6): qty 1

## 2019-04-14 MED ORDER — AZELASTINE HCL 0.1 % NA SOLN
1.0000 | Freq: Two times a day (BID) | NASAL | Status: DC
  Administered 2019-04-15 – 2019-04-17 (×5): 1 via NASAL
  Filled 2019-04-14: qty 30

## 2019-04-14 MED ORDER — SODIUM CHLORIDE 0.9% FLUSH
INTRAVENOUS | Status: DC | PRN
  Administered 2019-04-14: 50 mL

## 2019-04-14 MED ORDER — LACTATED RINGERS IV SOLN
INTRAVENOUS | Status: DC
  Administered 2019-04-14 (×3): via INTRAVENOUS

## 2019-04-14 MED ORDER — HYDROMORPHONE HCL 1 MG/ML IJ SOLN: 0.5000 mg | INTRAMUSCULAR | Status: DC | PRN

## 2019-04-14 MED ORDER — BUPIVACAINE LIPOSOME 1.3 % IJ SUSP
INTRAMUSCULAR | Status: DC | PRN
  Administered 2019-04-14: 20 mL

## 2019-04-14 MED ORDER — BUPIVACAINE IN DEXTROSE 0.75-8.25 % IT SOLN
INTRATHECAL | Status: DC | PRN
  Administered 2019-04-14: 1.6 mL via INTRATHECAL

## 2019-04-14 MED ORDER — BUPIVACAINE-EPINEPHRINE (PF) 0.5% -1:200000 IJ SOLN
INTRAMUSCULAR | Status: DC | PRN
  Administered 2019-04-14: 20 mL via PERINEURAL

## 2019-04-14 MED ORDER — POLYETHYLENE GLYCOL 3350 17 G PO PACK: 17.0000 g | PACK | Freq: Every day | ORAL | Status: DC | PRN

## 2019-04-14 MED ORDER — FENTANYL CITRATE (PF) 100 MCG/2ML IJ SOLN
INTRAMUSCULAR | Status: AC
  Filled 2019-04-14: qty 2

## 2019-04-14 MED ORDER — PROPOFOL 500 MG/50ML IV EMUL
INTRAVENOUS | Status: DC | PRN
  Administered 2019-04-14: 150 mg via INTRAVENOUS

## 2019-04-14 MED ORDER — TRANEXAMIC ACID-NACL 1000-0.7 MG/100ML-% IV SOLN
1000.0000 mg | Freq: Once | INTRAVENOUS | Status: AC
  Administered 2019-04-14: 1000 mg via INTRAVENOUS
  Filled 2019-04-14: qty 100

## 2019-04-14 MED ORDER — ONDANSETRON HCL 4 MG/2ML IJ SOLN
INTRAMUSCULAR | Status: DC | PRN
  Administered 2019-04-14: 4 mg via INTRAVENOUS

## 2019-04-14 MED ORDER — DULOXETINE HCL 20 MG PO CPEP
40.0000 mg | ORAL_CAPSULE | Freq: Every day | ORAL | Status: DC
  Administered 2019-04-15 – 2019-04-17 (×3): 40 mg via ORAL
  Filled 2019-04-14 (×3): qty 2

## 2019-04-14 MED ORDER — FENTANYL CITRATE (PF) 250 MCG/5ML IJ SOLN
INTRAMUSCULAR | Status: AC
  Filled 2019-04-14: qty 5

## 2019-04-14 MED ORDER — DEXAMETHASONE SODIUM PHOSPHATE 10 MG/ML IJ SOLN
INTRAMUSCULAR | Status: DC | PRN
  Administered 2019-04-14: 10 mg via INTRAVENOUS

## 2019-04-14 MED ORDER — DOCUSATE SODIUM 100 MG PO CAPS: 100.0000 mg | ORAL_CAPSULE | Freq: Two times a day (BID) | ORAL | 0 refills | Status: DC

## 2019-04-14 MED ORDER — DIPHENHYDRAMINE HCL 12.5 MG/5ML PO ELIX: 12.5000 mg | ORAL_SOLUTION | ORAL | Status: DC | PRN

## 2019-04-14 MED ORDER — GABAPENTIN 300 MG PO CAPS
300.0000 mg | ORAL_CAPSULE | Freq: Two times a day (BID) | ORAL | Status: DC
  Administered 2019-04-14 – 2019-04-17 (×6): 300 mg via ORAL
  Filled 2019-04-14 (×6): qty 1

## 2019-04-14 MED ORDER — MAGNESIUM CITRATE PO SOLN: 1.0000 | Freq: Once | ORAL | Status: DC | PRN

## 2019-04-14 MED ORDER — FENTANYL CITRATE (PF) 100 MCG/2ML IJ SOLN
50.0000 ug | INTRAMUSCULAR | Status: DC
  Administered 2019-04-14: 100 ug via INTRAVENOUS
  Filled 2019-04-14: qty 2

## 2019-04-14 MED ORDER — MIDAZOLAM HCL 2 MG/2ML IJ SOLN
INTRAMUSCULAR | Status: AC
  Filled 2019-04-14: qty 2

## 2019-04-14 MED ORDER — MIDAZOLAM HCL 5 MG/5ML IJ SOLN
INTRAMUSCULAR | Status: DC | PRN
  Administered 2019-04-14: 1 mg via INTRAVENOUS

## 2019-04-14 MED ORDER — TRANEXAMIC ACID-NACL 1000-0.7 MG/100ML-% IV SOLN
1000.0000 mg | INTRAVENOUS | Status: AC
  Administered 2019-04-14: 1000 mg via INTRAVENOUS
  Filled 2019-04-14: qty 100

## 2019-04-14 MED ORDER — OXYCODONE HCL 5 MG PO TABS
5.0000 mg | ORAL_TABLET | ORAL | Status: DC | PRN
  Administered 2019-04-14 – 2019-04-16 (×7): 5 mg via ORAL
  Filled 2019-04-14 (×7): qty 1

## 2019-04-14 MED ORDER — HYDROCHLOROTHIAZIDE 25 MG PO TABS
25.0000 mg | ORAL_TABLET | Freq: Every day | ORAL | Status: DC
  Administered 2019-04-15 – 2019-04-17 (×3): 25 mg via ORAL
  Filled 2019-04-14 (×3): qty 1

## 2019-04-14 SURGICAL SUPPLY — 56 items
APL SKNCLS STERI-STRIP NONHPOA (GAUZE/BANDAGES/DRESSINGS) ×1
ATTUNE MED DOME PAT 38 KNEE (Knees) ×1 IMPLANT
ATTUNE MED DOME PAT 38MM KNEE (Knees) ×1 IMPLANT
ATTUNE PS FEM RT SZ 4 CEM KNEE (Femur) ×2 IMPLANT
ATTUNE PSRP INSR SZ4 12 KNEE (Insert) ×1 IMPLANT
ATTUNE PSRP INSR SZ4 12MM KNEE (Insert) ×1 IMPLANT
BAG SPEC THK2 15X12 ZIP CLS (MISCELLANEOUS) ×1
BAG ZIPLOCK 12X15 (MISCELLANEOUS) ×3 IMPLANT
BANDAGE ACE 6X5 VEL STRL LF (GAUZE/BANDAGES/DRESSINGS) ×5 IMPLANT
BASEPLATE TIBIAL ROTATING SZ 4 (Knees) ×2 IMPLANT
BENZOIN TINCTURE PRP APPL 2/3 (GAUZE/BANDAGES/DRESSINGS) ×3 IMPLANT
BLADE SAGITTAL 25.0X1.19X90 (BLADE) ×2 IMPLANT
BLADE SAGITTAL 25.0X1.19X90MM (BLADE) ×1
BLADE SAW SGTL 11.0X1.19X90.0M (BLADE) ×3 IMPLANT
BLADE SURG SZ10 CARB STEEL (BLADE) ×6 IMPLANT
BOOTIES KNEE HIGH SLOAN (MISCELLANEOUS) ×3 IMPLANT
BOWL SMART MIX CTS (DISPOSABLE) ×3 IMPLANT
BSPLAT TIB 4 CMNT ROT PLAT STR (Knees) ×1 IMPLANT
CEMENT HV SMART SET (Cement) ×6 IMPLANT
CLOSURE WOUND 1/2 X4 (GAUZE/BANDAGES/DRESSINGS)
COVER SURGICAL LIGHT HANDLE (MISCELLANEOUS) ×3 IMPLANT
COVER WAND RF STERILE (DRAPES) IMPLANT
CUFF TOURN SGL QUICK 34 (TOURNIQUET CUFF) ×3
CUFF TRNQT CYL 34X4.125X (TOURNIQUET CUFF) ×1 IMPLANT
DECANTER SPIKE VIAL GLASS SM (MISCELLANEOUS) IMPLANT
DRAPE U-SHAPE 47X51 STRL (DRAPES) ×3 IMPLANT
DRSG AQUACEL AG ADV 3.5X10 (GAUZE/BANDAGES/DRESSINGS) ×3 IMPLANT
DURAPREP 26ML APPLICATOR (WOUND CARE) ×3 IMPLANT
ELECT REM PT RETURN 15FT ADLT (MISCELLANEOUS) ×3 IMPLANT
GAUZE XEROFORM 1X8 LF (GAUZE/BANDAGES/DRESSINGS) ×2 IMPLANT
GLOVE BIOGEL PI IND STRL 8 (GLOVE) ×2 IMPLANT
GLOVE BIOGEL PI INDICATOR 8 (GLOVE) ×4
GLOVE ECLIPSE 7.5 STRL STRAW (GLOVE) ×6 IMPLANT
GOWN STRL REUS W/TWL XL LVL3 (GOWN DISPOSABLE) ×6 IMPLANT
HANDPIECE INTERPULSE COAX TIP (DISPOSABLE) ×3
HOLDER FOLEY CATH W/STRAP (MISCELLANEOUS) IMPLANT
HOOD PEEL AWAY FLYTE STAYCOOL (MISCELLANEOUS) ×9 IMPLANT
KIT TURNOVER KIT A (KITS) IMPLANT
MANIFOLD NEPTUNE II (INSTRUMENTS) ×3 IMPLANT
NEEDLE HYPO 22GX1.5 SAFETY (NEEDLE) ×3 IMPLANT
NS IRRIG 1000ML POUR BTL (IV SOLUTION) ×3 IMPLANT
PACK ICE MAXI GEL EZY WRAP (MISCELLANEOUS) ×3 IMPLANT
PACK TOTAL KNEE CUSTOM (KITS) ×3 IMPLANT
PADDING CAST COTTON 6X4 STRL (CAST SUPPLIES) ×3 IMPLANT
PIN STEINMAN FIXATION KNEE (PIN) ×2 IMPLANT
PROTECTOR NERVE ULNAR (MISCELLANEOUS) ×3 IMPLANT
SET HNDPC FAN SPRY TIP SCT (DISPOSABLE) ×1 IMPLANT
STRIP CLOSURE SKIN 1/2X4 (GAUZE/BANDAGES/DRESSINGS) IMPLANT
SUT MNCRL AB 3-0 PS2 18 (SUTURE) ×3 IMPLANT
SUT VIC AB 0 CT1 36 (SUTURE) ×3 IMPLANT
SUT VIC AB 1 CT1 36 (SUTURE) ×6 IMPLANT
SYR CONTROL 10ML LL (SYRINGE) ×6 IMPLANT
TRAY FOLEY MTR SLVR 16FR STAT (SET/KITS/TRAYS/PACK) ×3 IMPLANT
WATER STERILE IRR 1000ML POUR (IV SOLUTION) ×6 IMPLANT
WRAP KNEE MAXI GEL POST OP (GAUZE/BANDAGES/DRESSINGS) ×2 IMPLANT
YANKAUER SUCT BULB TIP 10FT TU (MISCELLANEOUS) ×3 IMPLANT

## 2019-04-14 NOTE — Anesthesia Procedure Notes (Signed)
Anesthesia Regional Block: Adductor canal block   Pre-Anesthetic Checklist: ,, timeout performed, Correct Patient, Correct Site, Correct Laterality, Correct Procedure, Correct Position, site marked, Risks and benefits discussed,  Surgical consent,  Pre-op evaluation,  At surgeon's request and post-op pain management  Laterality: Right  Prep: Maximum Sterile Barrier Precautions used, chloraprep       Needles:  Injection technique: Single-shot  Needle Type: Echogenic Stimulator Needle     Needle Length: 9cm  Needle Gauge: 22     Additional Needles:   Procedures:,,,, ultrasound used (permanent image in chart),,,,  Narrative:  Start time: 04/14/2019 11:12 AM End time: 04/14/2019 11:22 AM Injection made incrementally with aspirations every 5 mL.  Performed by: Personally  Anesthesiologist: Freddrick March, MD  Additional Notes: Monitors applied. No increased pain on injection. No increased resistance to injection. Injection made in 5cc increments. Good needle visualization. Patient tolerated procedure well.

## 2019-04-14 NOTE — Anesthesia Postprocedure Evaluation (Signed)
Anesthesia Post Note  Patient: Keana Dueitt  Procedure(s) Performed: TOTAL KNEE ARTHROPLASTY (Right Knee)     Patient location during evaluation: PACU Anesthesia Type: Spinal Level of consciousness: awake and alert and oriented Pain management: pain level controlled Vital Signs Assessment: post-procedure vital signs reviewed and stable Respiratory status: spontaneous breathing, respiratory function stable and nonlabored ventilation Cardiovascular status: blood pressure returned to baseline and stable Postop Assessment: no headache, no backache, spinal receding and no apparent nausea or vomiting Anesthetic complications: no    Last Vitals:  Vitals:   04/14/19 1127 04/14/19 1452  BP:  (!) (P) 141/90  Pulse: 74 (P) 78  Resp: 17 (P) 15  Temp:  (P) 36.8 C  SpO2: 98% (P) 98%    Last Pain:  Vitals:   04/14/19 1452  PainSc: (P) 0-No pain                 Montez Hageman

## 2019-04-14 NOTE — H&P (Signed)
TOTAL KNEE ADMISSION H&P  Patient is being admitted for right total knee arthroplasty.  Subjective:  Chief Complaint:right knee pain.  HPI: [name], 73 y.o. female, has a history of pain and functional disability in the right knee due to arthritis and has failed non-surgical conservative treatments for greater than 12 weeks to includeNSAID's and/or analgesics, corticosteriod injections, viscosupplementation injections, use of assistive devices and activity modification.  Onset of symptoms was gradual, starting 8 years ago with gradually worsening course since that time. The patient noted no past surgery on the bilaterally knee(s).  Patient currently rates pain in the right knee(s) at 9 out of 10 with activity. Patient has night pain, worsening of pain with activity and weight bearing, pain that interferes with activities of daily living, pain with passive range of motion and joint swelling.  Patient has evidence of subchondral sclerosis, periarticular osteophytes, joint subluxation and joint space narrowing by imaging studies. This patient has had Failure of all reasonable conservative care. There is no active infection.  Patient Active Problem List   Diagnosis Date Noted  . Hyperglycemia 04/13/2018  . Acute bursitis of right shoulder 08/16/2017  . Abnormal CXR 07/26/2017  . Unexplained night sweats 07/26/2017  . Greater trochanteric bursitis of right hip 06/23/2017  . Sweating abnormality 03/09/2017  . Gingivitis 03/09/2017  . Herpes zoster without complication 57/10/7791  . Trapezoid ligament sprain, right, initial encounter 12/11/2016  . Fasciculation 12/11/2016  . Acute bronchitis 11/16/2016  . Degenerative joint disease of left shoulder 10/07/2016  . Degenerative arthritis of knee, bilateral 09/16/2016  . Spinal stenosis, lumbar region, with neurogenic claudication 06/17/2016  . Compression fracture of L1 lumbar vertebra ([place]) 03/31/2016  . Pelvic mass in female  03/31/2016  . Urinary frequency 02/27/2016  . 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 Carpal tunnel syndrome 09/18/2014  . Sprain of ankle, unspecified site 07/04/2014  . Primary localized osteoarthrosis, lower leg 05/21/2014  . Left knee pain 03/15/2014  . Food allergy 10/05/2013  . Eustachian tube dysfunction, left 04/24/2013  . Degenerative arthritis of left knee 04/24/2013  . Hot flashes 04/24/2013  . External otitis of left ear 12/15/2012  . Right lumbar radiculitis 04/25/2012  . Seasonal and perennial allergic rhinitis 03/18/2012  . Hearing loss in right ear 03/18/2012  . Esophageal dysphagia 02/11/2012  . Dysphagia 01/20/2012  . Rash 01/20/2012  . Bilateral knee pain 01/20/2012  . Preventative health care 01/14/2012  . GERD (gastroesophageal reflux disease) 01/14/2012  . HTN (hypertension) 01/14/2012  . Hyperlipidemia 01/14/2012  . Obesity 01/14/2012  . Osteoarthritis 01/14/2012  . DJD (degenerative joint disease), lumbar 01/14/2012  . Palpitations 11/21/2010  . Dyspnea on exertion 11/21/2010   Past Medical History:  Diagnosis Date  . Allergic rhinitis, cause unspecified   . Allergy    SEASONAL  . Anemia   . Cataract    Bilateral  . Chronic headaches   . Constipation   . Diverticulosis   . DJD (degenerative joint disease), lumbar   . GERD (gastroesophageal reflux disease)   . Hemorrhoids   . Hiatal hernia   . HTN (hypertension)   . Hyperlipidemia   . Obesity   . Osteoarthritis   . Tubular adenoma of colon     Past Surgical History:  Procedure Laterality Date  . ABLATION    . COLONOSCOPY    . TUBAL LIGATION      Current Facility-Administered Medications  Medication Dose Route Frequency Provider Last Rate Last Dose  . bupivacaine liposome (EXPAREL) 1.3 %  injection 266 mg  20 mL Other Once Dorna Leitz, MD       Current Outpatient Medications  Medication Sig Dispense Refill Last Dose  . atorvastatin (LIPITOR) 20 MG tablet Take 1 tablet (20 mg total)  by mouth daily. 90 tablet 1   . azelastine (ASTELIN) 0.1 % nasal spray Place 1 spray into both nostrils 2 (two) times daily. Use in each nostril as directed     . Calcium Carb-Cholecalciferol (CALCIUM 600/VITAMIN D3 PO) Take 1 tablet by mouth daily.     . cetirizine (ZYRTEC) 10 MG tablet Take 1 tablet (10 mg total) by mouth daily. As needed 90 tablet 3   . diltiazem (TIAZAC) 120 MG 24 hr capsule Take 1 capsule (120 mg total) by mouth daily. 90 capsule 1   . DULoxetine (CYMBALTA) 20 MG capsule Take 2 capsules (40 mg total) by mouth daily. 180 capsule 1   . esomeprazole (NEXIUM) 40 MG capsule TAKE 1 CAPSULE BY MOUTH EVERY DAY (Patient taking differently: Take 40 mg by mouth daily at 12 noon. ) 30 capsule 1   . gabapentin (NEURONTIN) 100 MG capsule Take 2 capsules (200 mg total) by mouth at bedtime. 60 capsule 3   . losartan-hydrochlorothiazide (HYZAAR) 100-25 MG tablet Take 1 tablet by mouth daily. 90 tablet 1   . metoprolol tartrate (LOPRESSOR) 50 MG tablet Take 1 tablet (50 mg total) by mouth 2 (two) times daily. 180 tablet 1   . Polyethyl Glycol-Propyl Glycol (SYSTANE OP) Place 1 drop into both eyes 3 (three) times daily as needed (dry eyes).     Marland Kitchen tiZANidine (ZANAFLEX) 2 MG tablet Take 2 mg by mouth every 6 (six) hours as needed for muscle spasms.     . traMADol (ULTRAM) 50 MG tablet Take 50-100 mg by mouth every 6 (six) hours as needed for moderate pain.   0   . Turmeric 500 MG TABS Take 500 mg by mouth daily.     . AMBULATORY NON FORMULARY MEDICATION 4 prong cane 1 Units 0   . ranitidine (ZANTAC) 150 MG tablet Take 1 tablet every evening as needed (Patient not taking: Reported on 12/30/2018) 30 tablet 6   . triamcinolone ointment (KENALOG) 0.5 % Apply 1 application topically 2 (two) times daily as needed. (Patient not taking: Reported on 12/30/2018) 30 g 1    Allergies  Allergen Reactions  . Soybean-Containing Drug Products Other (See Comments)    Allergy testing positive for soy beans but pt  uses soy sauce  . Sulfonamide Derivatives Itching and Rash    Social History   Tobacco Use  . Smoking status: Never Smoker  . Smokeless tobacco: Never Used  Substance Use Topics  . Alcohol use: No    Alcohol/week: 0.0 standard drinks    Family History  Problem Relation Age of Onset  . Heart disease Mother   . Hypertension Mother   . Diabetes Brother   . Breast cancer Sister   . Colon cancer Neg Hx   . Esophageal cancer Neg Hx   . Rectal cancer Neg Hx   . Stomach cancer Neg Hx      ROS ROS: I have reviewed the patient's review of systems thoroughly and there are no positive responses as relates to the HPI. Objective:  Physical Exam  Vital signs in last 24 hours:   Well-developed well-nourished patient in no acute distress. Alert and oriented x3 HEENT:within normal limits Cardiac: Regular rate and rhythm Pulmonary: Lungs clear to auscultation Abdomen: Soft  and nontender.  Normal active bowel sounds  Musculoskeletal: (Right knee: Painful range of motion.  Limited range of motion.  No instability.  Trace effusion.  Severe varus malalignment. Labs: Recent Results (from the past 2160 hour(s))  ABO/Rh     Status: None   Collection Time: 04/12/19  9:25 AM  Result Value Ref Range   ABO/RH(D)      A POS Performed at [facility], [place] [address] [place], [place] [address]   Surgical pcr screen     Status: None   Collection Time: 04/12/19 10:00 AM   Specimen: Nasal Mucosa; Nasal Swab  Result Value Ref Range   MRSA, PCR NEGATIVE NEGATIVE   Staphylococcus aureus NEGATIVE NEGATIVE    Comment: (NOTE) The Xpert SA Assay (FDA approved for NASAL specimens in patients 56 years of age and older), is one component of a comprehensive surveillance program. It is not intended to diagnose infection nor to guide or monitor treatment. Performed at [facility], [place] [address], [place], [place] [address]   APTT     Status: None   Collection  Time: 04/12/19 10:05 AM  Result Value Ref Range   aPTT 25 24 - 36 seconds    Comment: Performed at [facility], [place] [address], [place], [place] [address]  CBC WITH DIFFERENTIAL     Status: None   Collection Time: 04/12/19 10:05 AM  Result Value Ref Range   WBC 9.6 4.0 - 10.5 K/uL   RBC 4.17 3.87 - 5.11 MIL/uL   Hemoglobin 12.7 12.0 - 15.0 g/dL   HCT 39.0 36.0 - 46.0 %   MCV 93.5 80.0 - 100.0 fL   MCH 30.5 26.0 - 34.0 pg   MCHC 32.6 30.0 - 36.0 g/dL   RDW 12.3 11.5 - 15.5 %   Platelets 313 150 - 400 K/uL   nRBC 0.0 0.0 - 0.2 %   Neutrophils Relative % 63 %   Neutro Abs 6.2 1.7 - 7.7 K/uL   Lymphocytes Relative 22 %   Lymphs Abs 2.1 0.7 - 4.0 K/uL   Monocytes Relative 9 %   Monocytes Absolute 0.8 0.1 - 1.0 K/uL   Eosinophils Relative 4 %   Eosinophils Absolute 0.4 0.0 - 0.5 K/uL   Basophils Relative 1 %   Basophils Absolute 0.1 0.0 - 0.1 K/uL   Immature Granulocytes 1 %   Abs Immature Granulocytes 0.06 0.00 - 0.07 K/uL    Comment: Performed at [facility], [place] [address], [place], [place] [address]  Comprehensive metabolic panel     Status: Abnormal   Collection Time: 04/12/19 10:05 AM  Result Value Ref Range   Sodium 140 135 - 145 mmol/L   Potassium 3.3 (L) 3.5 - 5.1 mmol/L   Chloride 101 98 - 111 mmol/L   CO2 29 22 - 32 mmol/L   Glucose, Bld 107 (H) 70 - 99 mg/dL   BUN 25 (H) 8 - 23 mg/dL   Creatinine, Ser 0.64 0.44 - 1.00 mg/dL   Calcium 9.4 8.9 - 10.3 mg/dL   Total Protein 6.9 6.5 - 8.1 g/dL   Albumin 3.7 3.5 - 5.0 g/dL   AST 16 15 - 41 U/L   ALT 20 0 - 44 U/L   Alkaline Phosphatase 82 38 - 126 U/L   Total Bilirubin 0.7 0.3 - 1.2 mg/dL   GFR calc non Af Amer >60 >60 mL/min   GFR calc Af Amer >60 >60 mL/min  Anion gap 10 5 - 15    Comment: Performed at [facility], [place] [address] [place], [place] [address]  Protime-INR     Status: None   Collection Time: 04/12/19 10:05 AM  Result Value Ref Range    Prothrombin Time 11.8 11.4 - 15.2 seconds   INR 0.9 0.8 - 1.2    Comment: (NOTE) INR goal varies based on device and disease states. Performed at [facility], [place] [address], [place], [place] [address]   Type and screen Order type and screen if day of surgery is less than 15 days from draw of preadmission visit or order morning of surgery if day of surgery is greater than 6 days from preadmission visit.     Status: None   Collection Time: 04/12/19 10:05 AM  Result Value Ref Range   ABO/RH(D) A POS    Antibody Screen NEG    Sample Expiration 04/17/2019,2359    Extend sample reason      NO TRANSFUSIONS OR PREGNANCY IN THE PAST 3 MONTHS Performed at [facility], [place] [address], [place], [place] [address]   Urinalysis, Routine w reflex microscopic     Status: None   Collection Time: 04/12/19 10:05 AM  Result Value Ref Range   Color, Urine YELLOW YELLOW   APPearance CLEAR CLEAR   Specific Gravity, Urine 1.020 1.005 - 1.030   pH 6.0 5.0 - 8.0   Glucose, UA NEGATIVE NEGATIVE mg/dL   Hgb urine dipstick NEGATIVE NEGATIVE   Bilirubin Urine NEGATIVE NEGATIVE   Ketones, ur NEGATIVE NEGATIVE mg/dL   Protein, ur NEGATIVE NEGATIVE mg/dL   Nitrite NEGATIVE NEGATIVE   Leukocytes,Ua NEGATIVE NEGATIVE    Comment: Performed at [place] [address], [place], [place] [address]  SARS Coronavirus 2 (Performed in [facility] hospital lab)     Status: None   Collection Time: 04/12/19  1:37 PM   Specimen: Nasal Swab  Result Value Ref Range   SARS Coronavirus 2 NEGATIVE NEGATIVE    Comment: (NOTE) SARS-CoV-2 target nucleic acids are NOT DETECTED. The SARS-CoV-2 RNA is generally detectable in upper and lower respiratory specimens during the acute phase of infection. Negative results do not preclude SARS-CoV-2 infection, do not rule out co-infections with other pathogens, and should not be used as the sole basis for treatment or other  patient management decisions. Negative results must be combined with clinical observations, patient history, and epidemiological information. The expected result is Negative. Fact Sheet for Patients: TrashEliminator.se Fact Sheet for Healthcare Providers: WhoisBlogging.ch This test is not yet approved or cleared by the [place] FDA and  has been authorized for detection and/or diagnosis of SARS-CoV-2 by FDA under an Emergency Use Authorization (EUA). This EUA will remain  in effect (meaning this test can be used) for the duration of the COVID-19 declaration under Section 56 4(b)(1) of the Act, 21 U.S.C. section 360bbb-3(b)(1), unless the authorization is terminated or revoked sooner. Performed at [facility], [place] [address] [place], [place] [address]     Estimated body mass index is 38.11 kg/m as calculated from the following:   Height as of 04/12/19: 5\' 3"  (1.6 m).   Weight as of 04/12/19: 97.6 kg.   Imaging Review Plain radiographs demonstrate severe degenerative joint disease of the right knee(s). The overall alignment ismild varus. The bone quality appears to be fair for age and reported activity level.      Assessment/Plan:  End stage  arthritis, right knee   The patient history, physical examination, clinical judgment of the provider and imaging studies are consistent with end stage degenerative joint disease of the right knee(s) and total knee arthroplasty is deemed medically necessary. The treatment options including medical management, injection therapy arthroscopy and arthroplasty were discussed at length. The risks and benefits of total knee arthroplasty were presented and reviewed. The risks due to aseptic loosening, infection, stiffness, patella tracking problems, thromboembolic complications and other imponderables were discussed. The patient acknowledged the explanation, agreed to proceed with the plan and  consent was signed. Patient is being admitted for inpatient treatment for surgery, pain control, PT, OT, prophylactic antibiotics, VTE prophylaxis, progressive ambulation and ADL's and discharge planning. The patient is planning to be discharged home with home health services     Patient's anticipated LOS is less than 2 midnights, meeting these requirements: - Younger than 60 - Lives within 1 hour of care - Has a competent adult at home to recover with post-op recover - NO history of  - Chronic pain requiring opiods  - Diabetes  - Coronary Artery Disease  - Heart failure  - Heart attack  - Stroke  - DVT/VTE  - Cardiac arrhythmia  - Respiratory Failure/COPD  - Renal failure  - Anemia  - Advanced Liver disease

## 2019-04-14 NOTE — Plan of Care (Signed)
progressing 

## 2019-04-14 NOTE — Brief Op Note (Signed)
04/14/2019  2:32 PM  PATIENT:  [name]  73 y.o. female  PRE-OPERATIVE DIAGNOSIS:  OSTEOARTHRITIS RIGHT KNEE  POST-OPERATIVE DIAGNOSIS:  OSTEOARTHRITIS RIGHT KNEE  PROCEDURE:  Procedure(s): TOTAL KNEE ARTHROPLASTY (Right)  SURGEON:  Surgeon(s) and Role:    Dorna Leitz, MD - Primary  PHYSICIAN ASSISTANT:   ASSISTANTS: jim bethune   ANESTHESIA:   spinal and general  EBL:  25 mL   BLOOD ADMINISTERED:none  DRAINS: none   LOCAL MEDICATIONS USED:  MARCAINE    and OTHER experel  SPECIMEN:  No Specimen  DISPOSITION OF SPECIMEN:  N/A  COUNTS:  YES  TOURNIQUET:   Total Tourniquet Time Documented: Thigh (Right) - 67 minutes Total: Thigh (Right) - 67 minutes   DICTATION: .Other Dictation: Dictation Number [id number]  PLAN OF CARE: Admit for overnight observation  PATIENT DISPOSITION:  PACU - hemodynamically stable.   Delay start of Pharmacological VTE agent (>24hrs) due to surgical blood loss or risk of bleeding: no

## 2019-04-14 NOTE — Anesthesia Procedure Notes (Signed)
Procedure Name: LMA Insertion Date/Time: 04/14/2019 12:57 PM Performed by: Glory Buff, CRNA Pre-anesthesia Checklist: Patient identified, Emergency Drugs available, Suction available and Patient being monitored Patient Re-evaluated:Patient Re-evaluated prior to induction Oxygen Delivery Method: Circle system utilized Preoxygenation: Pre-oxygenation with 100% oxygen Induction Type: IV induction Ventilation: Mask ventilation without difficulty LMA: LMA inserted LMA Size: 4.0 Number of attempts: 1 Placement Confirmation: positive ETCO2 Tube secured with: Tape Dental Injury: Teeth and Oropharynx as per pre-operative assessment

## 2019-04-14 NOTE — Anesthesia Preprocedure Evaluation (Addendum)
Anesthesia Evaluation  Patient identified by MRN, date of birth, ID band Patient awake    Reviewed: Allergy & Precautions, NPO status , Patient's Chart, lab work & pertinent test results, reviewed documented beta blocker date and time   Airway Mallampati: II  TM Distance: >3 FB Neck ROM: Full    Dental no notable dental hx. (+) Dental Advisory Given, Poor Dentition,    Pulmonary neg pulmonary ROS,    Pulmonary exam normal breath sounds clear to auscultation       Cardiovascular hypertension, Pt. on home beta blockers and Pt. on medications negative cardio ROS Normal cardiovascular exam Rhythm:Regular Rate:Normal     Neuro/Psych  Headaches, PSYCHIATRIC DISORDERS Depression    GI/Hepatic Neg liver ROS, hiatal hernia, GERD  Medicated,  Endo/Other  Morbid obesity  Renal/GU negative Renal ROS  negative genitourinary   Musculoskeletal  (+) Arthritis , Osteoarthritis,    Abdominal   Peds  Hematology negative hematology ROS (+)   Anesthesia Other Findings   Reproductive/Obstetrics                            Anesthesia Physical Anesthesia Plan  ASA: III  Anesthesia Plan: Spinal and Regional   Post-op Pain Management:  Regional for Post-op pain   Induction:   PONV Risk Score and Plan: 2 and Treatment may vary due to age or medical condition, Propofol infusion, Ondansetron and Dexamethasone  Airway Management Planned: Natural Airway and Simple Face Mask  Additional Equipment:   Intra-op Plan:   Post-operative Plan:   Informed Consent: I have reviewed the patients History and Physical, chart, labs and discussed the procedure including the risks, benefits and alternatives for the proposed anesthesia with the patient or authorized representative who has indicated his/her understanding and acceptance.     Dental advisory given  Plan Discussed with: CRNA  Anesthesia Plan Comments:          Anesthesia Quick Evaluation

## 2019-04-14 NOTE — Discharge Instructions (Signed)

## 2019-04-14 NOTE — Op Note (Signed)
NAME: KERSTI, SCAVONE MEDICAL RECORD GQ:[id number] ACCOUNT [id number] DATE OF BIRTH:[birth year] FAC[facility] LOC[facility]: [facility] PHYSICIAN:Jatavia Keltner L. Lenard Kampf, MD  OPERATIVE REPORT  DATE OF PROCEDURE:  04/14/2019  PREOPERATIVE DIAGNOSES:  End-stage degenerative joint disease, right knee, with severe bone-on-bone change and medial subluxation.  POSTOPERATIVE DIAGNOSES:  End-stage degenerative joint disease, right knee, with severe bone-on-bone change and medial subluxation.   PROCEDURE: 1.  Right total knee replacement with an Attune system, size 4 femur, size 4 tibia, 12 mm bridging bearing, and a 38 mm all polyethylene patella. 2.  Lateral retinacular release.  SURGEON:  Dorna Leitz, MD  ASSISTANT:  Gaspar Skeeters, PA-C, who was present for the entire case and assisted by manipulation, retraction, and closing to minimize OR time.  BRIEF HISTORY:  The patient is a 73 year old female with a long history of significant complaints of right knee pain.  She has been treated conservatively for a prolonged period of time.  She had failed injection therapy, activity modification,  medication.  She had x-rays showing severe bone-on-bone change and medial subluxation, and after failure of all conservative care, she was taken to the operating room for right total knee replacement.  DESCRIPTION OF PROCEDURE:  The patient was taken to the operating room after adequate anesthesia was obtained with a spinal and then ultimately a general anesthetic.  The patient was placed supine on the operating table.  The right leg was prepped and  draped in usual sterile fashion.  Following this, the leg was exsanguinated, blood pressure tourniquet inflated to 350 mmHg.  Following this, a midline incision was made, subcutaneous tissue down along the extensor mechanism.  Medial parapatellar  arthrotomy was undertaken.  Medial and lateral menisci were removed, retropatellar fat pad, synovium over the anterior aspect of the  femur, and the anterior and posterior cruciates.  Once this was done, the intramedullary pilot hole was drilled in the  femur.  A 5-degree valgus inclination cut was made, and 9 mm distal bone resected.  Following this, we tried to size her and do the femur first.  We could not get to it because of her morbid obesity.  We went to the tibia, cut it down, and then went back  to the femur.  It was sized to a 4, and anterior and posterior cuts were made chamfers and box.  Following this, we turned to the tibia.  The tibia was sized to a 4, and it was drilled and keeled.  Osteophytes were removed medially, and they were  significant.  Following this, attention was turned towards trial component.  We put a 10 in.  That seemed to be a good space.  It was very difficult to get it exactly right just based on the overall situation.  We resected 15 mm from the high side to try  to get her straight and cut below the medial side deficit.  We trialed with a 10, trialed with a 12.  Twelve seemed to be a little bit better, although it was really difficult to tell.  We ultimately got to the patella, cut it down to the level of 13 mm  and drilled for a 38 poly and then put that in.  At this point, the wounds were irrigated and suctioned dry.  The final components were then cemented into place, size 4 tibia, size 4 femur.  A 12 mm bridging bearing trial was placed and held with the  femur in place.  We then turned towards the patella and put  a 38 all poly and held it with a clamp.  Removed all excess bone cement.  Allowed the cement to completely harden.  Once the cement was completely hardened, we took the trial poly out and  trialed again, a 10 and a 12.  It felt like 12 was probably best.  We used towel clips in multiple attempts to try to get this right.  We are happy that she could get into full extension with a 12.  We went ahead and used a 12 poly, and once that was in  place, the final poly was opened.  We did a medial  parapatellar arthrotomy closure with care being taken to get a watertight closure.  _____, that was scrubbed.   The medial parapatellar was closed with 1 Vicryl running.  The skin was closed with 0  Vicryl, multiple at 2-0 Vicryl, and then 3-0 Monocryl subcuticular.  Benzoin, Steri-Strips, and a dry sterile compressive dressing were applied, and the patient was taken to recovery.  She was noted to be in satisfactory condition.  Estimated blood loss  for the procedure was minimal.  LN/NUANCE  D:04/14/2019 T:04/14/2019 JOB:[id number]

## 2019-04-14 NOTE — Transfer of Care (Signed)
Immediate Anesthesia Transfer of Care Note  Patient: [name]  Procedure(s) Performed: TOTAL KNEE ARTHROPLASTY (Right Knee)  Patient Location: PACU  Anesthesia Type:General  Level of Consciousness: drowsy, patient cooperative and responds to stimulation  Airway & Oxygen Therapy: Patient Spontanous Breathing and Patient connected to face mask oxygen  Post-op Assessment: Report given to RN and Post -op Vital signs reviewed and stable  Post vital signs: Reviewed and stable  Last Vitals:  Vitals Value Taken Time  BP    Temp    Pulse    Resp    SpO2      Last Pain: There were no vitals filed for this visit.    Patients Stated Pain Goal: 4 (23/30/07 6226)  Complications: No apparent anesthesia complications

## 2019-04-14 NOTE — Progress Notes (Signed)
Assisted Dr. Lanetta Inch with right, ultrasound guided, adductor canal block. Side rails up, monitors on throughout procedure. See vital signs in flow sheet. Tolerated Procedure well.

## 2019-04-14 NOTE — Anesthesia Procedure Notes (Signed)
Spinal  Patient location during procedure: OR Start time: 04/14/2019 12:29 PM End time: 04/14/2019 12:39 PM Staffing Anesthesiologist: Freddrick March, MD Resident/CRNA: Glory Buff, CRNA Performed: resident/CRNA  Preanesthetic Checklist Completed: patient identified, site marked, surgical consent, pre-op evaluation, timeout performed, IV checked, risks and benefits discussed and monitors and equipment checked Spinal Block Patient position: sitting Prep: DuraPrep Patient monitoring: heart rate, cardiac monitor, continuous pulse ox and blood pressure Approach: midline Location: L3-4 Injection technique: single-shot Needle Needle type: Quincke  Needle gauge: 25 G Needle length: 9 cm Assessment Sensory level: T4 Additional Notes Kit expiration date checked and verified.  Sterile prep and drape, skin local with 1% lidocaine, - heme, - paraesthesia, + CSF but unable to aspirate CSFwith 24 g pencan, needle changed to 25g quincke, CSF obtained, patient tolerated procedure. well.

## 2019-04-14 NOTE — Evaluation (Signed)
Physical Therapy Evaluation Patient Details Name: [name] MRN: [id number] DOB: 1946/02/11 Today's Date: 04/14/2019   History of Present Illness  s/p R TKA, HTN, OA  Clinical Impression  Pt is s/p TKA resulting in the deficits listed below (see PT Problem List).  Pt extremely drowsy but willing to work with PT. Mild dizziness with sitting EOB; pt able to take pivotal steps to chair with min/mod assist and multi-modal cues for safety and sequencing of tasks.   Pt will benefit from skilled PT to increase their independence and safety with mobility to allow discharge to the venue listed below.      Follow Up Recommendations Follow surgeon's recommendation for DC plan and follow-up therapies    Equipment Recommendations  None recommended by PT    Recommendations for Other Services       Precautions / Restrictions Precautions Precautions: Fall;Knee Restrictions Weight Bearing Restrictions: No Other Position/Activity Restrictions: WBAT      Mobility  Bed Mobility Overal bed mobility: Needs Assistance Bed Mobility: Supine to Sit     Supine to sit: Min assist;Mod assist     General bed mobility comments: incr time, assist with RLE and trunk to come to sit  Transfers Overall transfer level: Needs assistance Equipment used: Rolling walker (2 wheeled) Transfers: Sit to/from [facility] Sit to Stand: Min assist Stand pivot transfers: Min assist;Mod assist       General transfer comment: assist to rise and transition to RW. multi-modal cues for sequencing, safety, posture. pt attempting to lean forearms on RW, chair pulled to pt d/t fatigue  Ambulation/Gait             General Gait Details: pivotal steps only  Stairs            Wheelchair Mobility    Modified Rankin (Stroke Patients Only)       Balance Overall balance assessment: Needs assistance Sitting-balance support: Feet supported;No upper extremity supported Sitting  balance-Leahy Scale: Fair       Standing balance-Leahy Scale: Poor Standing balance comment: reliant on UEs and external support                             Pertinent Vitals/Pain Pain Assessment: 0-10 Pain Score: 4  Pain Location: right knee Pain Descriptors / Indicators: Sore Pain Intervention(s): Premedicated before session;Monitored during session;Limited activity within patient's tolerance;Repositioned    Home Living Family/patient expects to be discharged to:: Private residence Living Arrangements: Children Available Help at Discharge: Family;Available PRN/intermittently(son and dtr assisting) Type of Home: House Home Access: Ramped entrance     Home Layout: One level Home Equipment: Bedside commode;Walker - 2 wheels;Cane - quad      Prior Function Level of Independence: Independent with assistive device(s)               Hand Dominance        Extremity/Trunk Assessment   Upper Extremity Assessment Upper Extremity Assessment: Overall WFL for tasks assessed    Lower Extremity Assessment Lower Extremity Assessment: Generalized weakness;RLE deficits/detail RLE Deficits / Details: ankle WFL; knee extension and hip flexion 2+/5, limited by post op pain and weakness       Communication   Communication: No difficulties  Cognition Arousal/Alertness: Awake/alert Behavior During Therapy: WFL for tasks assessed/performed Overall Cognitive Status: Within Functional Limits for tasks assessed  General Comments      Exercises Total Joint Exercises Ankle Circles/Pumps: AROM;10 reps;Both   Assessment/Plan    PT Assessment Patient needs continued PT services  PT Problem List Decreased strength;Decreased range of motion;Decreased activity tolerance;Decreased knowledge of use of DME;Pain;Decreased mobility;Decreased balance       PT Treatment Interventions DME instruction;Therapeutic  activities;Gait training;Functional mobility training;Therapeutic exercise;Patient/family education    PT Goals (Current goals can be found in the Care Plan section)  Acute Rehab PT Goals PT Goal Formulation: With patient Time For Goal Achievement: 04/20/19 Potential to Achieve Goals: Good    Frequency 7X/week   Barriers to discharge        Co-evaluation               AM-PAC PT "6 Clicks" Mobility  Outcome Measure Help needed turning from your back to your side while in a flat bed without using bedrails?: A Little Help needed moving from lying on your back to sitting on the side of a flat bed without using bedrails?: A Little Help needed moving to and from a bed to a chair (including a wheelchair)?: A Little Help needed standing up from a chair using your arms (e.g., wheelchair or bedside chair)?: A Little Help needed to walk in hospital room?: A Lot Help needed climbing 3-5 steps with a railing? : Total 6 Click Score: 15    End of Session Equipment Utilized During Treatment: Gait belt Activity Tolerance: Patient limited by pain;Patient limited by lethargy(very drowsy) Patient left: in chair;with chair alarm set;with call bell/phone within reach Nurse Communication: Mobility status PT Visit Diagnosis: Difficulty in walking, not elsewhere classified (R26.2)    Time: 7619-5093 PT Time Calculation (min) (ACUTE ONLY): 29 min   Charges:   PT Evaluation $PT Eval Low Complexity: 1 Low PT Treatments $Therapeutic Activity: 8-22 mins        Kenyon Ana, PT  Pager: [phone] Acute Rehab Dept [facility]): [phone]   04/14/2019   [facility] 04/14/2019, 7:01 PM

## 2019-04-15 DIAGNOSIS — Z882 Allergy status to sulfonamides status: Secondary | ICD-10-CM | POA: Diagnosis not present

## 2019-04-15 DIAGNOSIS — M1711 Unilateral primary osteoarthritis, right knee: Secondary | ICD-10-CM | POA: Diagnosis not present

## 2019-04-15 DIAGNOSIS — K219 Gastro-esophageal reflux disease without esophagitis: Secondary | ICD-10-CM | POA: Diagnosis not present

## 2019-04-15 DIAGNOSIS — E785 Hyperlipidemia, unspecified: Secondary | ICD-10-CM | POA: Diagnosis not present

## 2019-04-15 DIAGNOSIS — R262 Difficulty in walking, not elsewhere classified: Secondary | ICD-10-CM | POA: Diagnosis not present

## 2019-04-15 DIAGNOSIS — Z79899 Other long term (current) drug therapy: Secondary | ICD-10-CM | POA: Diagnosis not present

## 2019-04-15 DIAGNOSIS — I10 Essential (primary) hypertension: Secondary | ICD-10-CM | POA: Diagnosis not present

## 2019-04-15 DIAGNOSIS — Z8249 Family history of ischemic heart disease and other diseases of the circulatory system: Secondary | ICD-10-CM | POA: Diagnosis not present

## 2019-04-15 LAB — CBC
HCT: 35.8 % — ABNORMAL LOW (ref 36.0–46.0)
Hemoglobin: 11.1 g/dL — ABNORMAL LOW (ref 12.0–15.0)
MCH: 29.3 pg (ref 26.0–34.0)
MCHC: 31 g/dL (ref 30.0–36.0)
MCV: 94.5 fL (ref 80.0–100.0)
Platelets: 306 10*3/uL (ref 150–400)
RBC: 3.79 MIL/uL — ABNORMAL LOW (ref 3.87–5.11)
RDW: 12 % (ref 11.5–15.5)
WBC: 14.5 10*3/uL — ABNORMAL HIGH (ref 4.0–10.5)
nRBC: 0 % (ref 0.0–0.2)

## 2019-04-15 NOTE — TOC Initial Note (Signed)
Transition of Care [facility]) - Initial/Assessment Note    Patient Details  Name: [name] MRN: [id number] Date of Birth: May 02, 1946  Transition [facility]) CM/SW Contact:    Joaquin Courts, RN Phone Number: 04/15/2019, 11:43 AM  Clinical Narrative:      CM spoke with patient at bedside. [facility] for [facility], reports has rolling walker and 3-in-1 at home.               Expected Discharge Plan: [place] Barriers to Discharge: No Barriers Identified   Patient Goals and CMS Choice Patient states their goals for this hospitalization and ongoing recovery are:: to go home CMS Medicare.gov Compare Post Acute Care list provided to:: Patient Choice offered to / list presented to : Patient  Expected Discharge Plan and Services Expected Discharge Plan: [place]   Discharge Planning Services: CM Consult Post Acute Care Choice: [place] arrangements for the past 2 months: Single Family Home Expected Discharge Date: 04/15/19               DME Arranged: N/A DME Agency: NA       HH Arranged: PT HH Agency: [place] ([place]) Date [place]: 04/15/19 Time [place]: 1143 Representative spoke with at [place]: [place] Arrangements/Services Living arrangements for the past 2 months: [place] with:: Adult Children Patient language and need for interpreter reviewed:: Yes Do you feel safe going back to the place where you live?: Yes      Need for Family Participation in Patient Care: Yes (Comment) Care giver support system in place?: Yes (comment)   Criminal Activity/Legal Involvement Pertinent to Current Situation/Hospitalization: No - Comment as needed  Activities of Daily Living Home Assistive Devices/Equipment: Eyeglasses, Environmental consultant (specify type), Grab bars in shower, Shower chair with back ADL Screening (condition at  time of admission) Patient's cognitive ability adequate to safely complete daily activities?: Yes Is the patient deaf or have difficulty hearing?: No Does the patient have difficulty seeing, even when wearing glasses/contacts?: No Does the patient have difficulty concentrating, remembering, or making decisions?: No Patient able to express need for assistance with ADLs?: Yes Does the patient have difficulty dressing or bathing?: No Independently performs ADLs?: Yes (appropriate for developmental age) Does the patient have difficulty walking or climbing stairs?: Yes Weakness of Legs: Both Weakness of Arms/Hands: None  Permission Sought/Granted                  Emotional Assessment Appearance:: Appears stated age Attitude/Demeanor/Rapport: Engaged Affect (typically observed): Accepting Orientation: : Oriented to Place, Oriented to  Time, Oriented to Situation, Oriented to Self   Psych Involvement: No (comment)  Admission diagnosis:  OSTEOARTHRITIS RIGHT KNEE Patient Active Problem List   Diagnosis Date Noted  . Primary osteoarthritis of right knee 04/14/2019  . Hyperglycemia 04/13/2018  . Acute bursitis of right shoulder 08/16/2017  . Abnormal CXR 07/26/2017  . Unexplained night sweats 07/26/2017  . Greater trochanteric bursitis of right hip 06/23/2017  . Sweating abnormality 03/09/2017  . Gingivitis 03/09/2017  . Herpes zoster without complication 86/76/7209  . Trapezoid ligament sprain, right, initial encounter 12/11/2016  . Fasciculation 12/11/2016  . Acute bronchitis 11/16/2016  . Degenerative joint disease of left shoulder 10/07/2016  . Degenerative arthritis of knee, bilateral 09/16/2016  . Spinal stenosis, lumbar region, with neurogenic claudication 06/17/2016  . Compression fracture of L1 lumbar  vertebra ([place]) 03/31/2016  . Pelvic mass in female 03/31/2016  . Urinary frequency 02/27/2016  . 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 Carpal tunnel syndrome  09/18/2014  . Sprain of ankle, unspecified site 07/04/2014  . Primary localized osteoarthrosis, lower leg 05/21/2014  . Left knee pain 03/15/2014  . Food allergy 10/05/2013  . Eustachian tube dysfunction, left 04/24/2013  . Degenerative arthritis of left knee 04/24/2013  . Hot flashes 04/24/2013  . External otitis of left ear 12/15/2012  . Right lumbar radiculitis 04/25/2012  . Seasonal and perennial allergic rhinitis 03/18/2012  . Hearing loss in right ear 03/18/2012  . Esophageal dysphagia 02/11/2012  . Dysphagia 01/20/2012  . Rash 01/20/2012  . Bilateral knee pain 01/20/2012  . Preventative health care 01/14/2012  . GERD (gastroesophageal reflux disease) 01/14/2012  . HTN (hypertension) 01/14/2012  . Hyperlipidemia 01/14/2012  . Obesity 01/14/2012  . Osteoarthritis 01/14/2012  . DJD (degenerative joint disease), lumbar 01/14/2012  . Palpitations 11/21/2010  . Dyspnea on exertion 11/21/2010   PCP:  Biagio Borg, MD Pharmacy:   [facility] [place], [place] [address] Phone: [phone] Fax: [phone]  [facility] [phone] - [place], [place] - [address] [place] [address] Phone: [phone] Fax: [phone]  [place], [place] [facility] [address] [place] [address] [place] [address] Phone: [phone] Fax: [phone]     Social Determinants of Health ([place]) Interventions    Readmission Risk Interventions No flowsheet data found.

## 2019-04-15 NOTE — Progress Notes (Signed)

## 2019-04-15 NOTE — Progress Notes (Signed)
   04/15/19 1539  PT Visit Information  Last PT Received On 04/15/19  Pt  Progressing slowly may need SNF if pt does not improve tomorrow; discussed  With pt may need to consider SNF vs full 24hr assist from family  Assistance Needed +2  History of Present Illness s/p R TKA, HTN, OA  Precautions  Precautions Fall;Knee  Restrictions  Weight Bearing Restrictions No  Other Position/Activity Restrictions WBAT  Pain Assessment  Pain Assessment 0-10  Pain Score 3  Pain Location right knee  Pain Descriptors / Indicators Sore  Pain Intervention(s) Limited activity within patient's tolerance;Monitored during session;Premedicated before session;Repositioned  Cognition  Arousal/Alertness Awake/alert  Behavior During Therapy WFL for tasks assessed/performed  Overall Cognitive Status Within Functional Limits for tasks assessed  Bed Mobility  Overal bed mobility Needs Assistance  Bed Mobility Supine to Sit  Supine to sit Mod assist  General bed mobility comments incr time, assist with RLE and trunk to come to sit  Transfers  Overall transfer level Needs assistance  Equipment used Rolling walker (2 wheeled)  Transfers Sit to/from Stand;Stand Pivot Transfers  Sit to Stand Mod assist  Stand pivot transfers Mod assist  General transfer comment assist to rise and transition to RW. multi-modal cues for sequencing, safety, posture   Ambulation/Gait  Ambulation/Gait assistance Mod assist;+2 safety/equipment  Gait Distance (Feet) 5 Feet  Assistive device Rolling walker (2 wheeled)  Gait Pattern/deviations Step-to pattern;Decreased step length - right;Decreased step length - left;Decreased weight shift to right  General Gait Details incr time needed. multi-modal cues for sequence, trunk extension, RW position. assist needed for balance and safety  Balance  Standing balance-Leahy Scale Poor  Standing balance comment reliant on UEs and external support  PT - End of Session  Equipment Utilized  During Treatment Gait belt  Activity Tolerance Patient limited by fatigue;Patient tolerated treatment well  Patient left with call bell/phone within reach;in bed;with bed alarm set  Nurse Communication Mobility status   PT - Assessment/Plan  PT Plan Current plan remains appropriate;Other (comment) (? may need SNF)  PT Visit Diagnosis Difficulty in walking, not elsewhere classified (R26.2)  PT Frequency (ACUTE ONLY) 7X/week  Follow Up Recommendations Follow surgeon's recommendation for DC plan and follow-up therapies  PT equipment None recommended by PT  AM-PAC PT "6 Clicks" Mobility Outcome Measure (Version 2)  Help needed turning from your back to your side while in a flat bed without using bedrails? 2  Help needed moving from lying on your back to sitting on the side of a flat bed without using bedrails? 2  Help needed moving to and from a bed to a chair (including a wheelchair)? 2  Help needed standing up from a chair using your arms (e.g., wheelchair or bedside chair)? 2  Help needed to walk in hospital room? 2  Help needed climbing 3-5 steps with a railing?  1  6 Click Score 11  Consider Recommendation of Discharge To: CIR/SNF/LTACH  PT Goal Progression  Progress towards PT goals Progressing toward goals (slow progress d/t pain fatigue)  Acute Rehab PT Goals  PT Goal Formulation With patient  Time For Goal Achievement 04/20/19  Potential to Achieve Goals Good  PT Time Calculation  PT Start Time (ACUTE ONLY) 1515  PT Stop Time (ACUTE ONLY) 1535  PT Time Calculation (min) (ACUTE ONLY) 20 min  PT General Charges  $$ ACUTE PT VISIT 1 Visit  PT Treatments  $Therapeutic Activity 8-22 mins

## 2019-04-15 NOTE — Progress Notes (Signed)
Physical Therapy Treatment Patient Details Name: [name] MRN: [id number] DOB: 03/15/1946 Today's Date: 04/15/2019    History of Present Illness s/p R TKA, HTN, OA    PT Comments    Pt progressing slowly,  Not ready for d/c today. Will see again in pm  Follow Up Recommendations  Follow surgeon's recommendation for DC plan and follow-up therapies     Equipment Recommendations  None recommended by PT    Recommendations for Other Services       Precautions / Restrictions Precautions Precautions: Fall;Knee Restrictions Weight Bearing Restrictions: No Other Position/Activity Restrictions: WBAT    Mobility  Bed Mobility Overal bed mobility: Needs Assistance Bed Mobility: Supine to Sit     Supine to sit: Mod assist     General bed mobility comments: incr time, assist with RLE and trunk to come to sit  Transfers Overall transfer level: Needs assistance Equipment used: Rolling walker (2 wheeled) Transfers: Sit to/from Stand Sit to Stand: Mod assist         General transfer comment: assist to rise and transition to RW. multi-modal cues for sequencing, safety, posture   Ambulation/Gait Ambulation/Gait assistance: Min assist;Mod assist;+2 safety/equipment Gait Distance (Feet): 16 Feet Assistive device: Rolling walker (2 wheeled) Gait Pattern/deviations: Step-to pattern;Decreased step length - right;Decreased step length - left;Decreased weight shift to right     General Gait Details: multi-modal cues for sequence, trunk extension, RW position. assist needed for balance and safety   Stairs             Wheelchair Mobility    Modified Rankin (Stroke Patients Only)       Balance                                            Cognition Arousal/Alertness: Awake/alert Behavior During Therapy: WFL for tasks assessed/performed Overall Cognitive Status: Within Functional Limits for tasks assessed                                        Exercises Total Joint Exercises Ankle Circles/Pumps: AROM;10 reps;Both Quad Sets: AROM;Both;10 reps Heel Slides: AAROM;Right;10 reps Hip ABduction/ADduction: AAROM;Right;10 reps Straight Leg Raises: AAROM;Right;10 reps Goniometric ROM: grossly 8* to 60* AAROM right knee    General Comments        Pertinent Vitals/Pain Pain Assessment: 0-10 Pain Location: right knee Pain Descriptors / Indicators: Sore Pain Intervention(s): Limited activity within patient's tolerance;Monitored during session;Repositioned;Premedicated before session    Home Living                      Prior Function            PT Goals (current goals can now be found in the care plan section) Acute Rehab PT Goals PT Goal Formulation: With patient Time For Goal Achievement: 04/20/19 Potential to Achieve Goals: Good Progress towards PT goals: Progressing toward goals    Frequency    7X/week      PT Plan Current plan remains appropriate    Co-evaluation              AM-PAC PT "6 Clicks" Mobility   Outcome Measure  Help needed turning from your back to your side while in a flat bed without using bedrails?: A Lot Help needed  moving from lying on your back to sitting on the side of a flat bed without using bedrails?: A Lot Help needed moving to and from a bed to a chair (including a wheelchair)?: A Lot Help needed standing up from a chair using your arms (e.g., wheelchair or bedside chair)?: A Lot Help needed to walk in hospital room?: A Lot Help needed climbing 3-5 steps with a railing? : Total 6 Click Score: 11    End of Session Equipment Utilized During Treatment: Gait belt Activity Tolerance: Patient limited by fatigue;Patient tolerated treatment well Patient left: in chair;with chair alarm set;with call bell/phone within reach Nurse Communication: Mobility status PT Visit Diagnosis: Difficulty in walking, not elsewhere classified (R26.2)     Time:  1036-1100 PT Time Calculation (min) (ACUTE ONLY): 24 min  Charges:  $Gait Training: 8-22 mins $Therapeutic Exercise: 8-22 mins                     Kenyon Ana, PT  Pager: [phone] Acute Rehab Dept [facility]): [phone]   04/15/2019    [facility] 04/15/2019, 11:53 AM

## 2019-04-15 NOTE — Discharge Summary (Addendum)
Patient ID: [name] MRN: [id number] DOB/AGE: 19-Sep-1946 73 y.o.  Admit date: 04/14/2019 Discharge date: 06/17/2019  Admission Diagnoses:  Principal Problem:   Primary osteoarthritis of right knee   Discharge Diagnoses:  Same  Past Medical History:  Diagnosis Date  . Allergic rhinitis, cause unspecified   . Allergy    SEASONAL  . Anemia   . Cataract    Bilateral  . Chronic headaches   . Constipation   . Diverticulosis   . DJD (degenerative joint disease), lumbar   . GERD (gastroesophageal reflux disease)   . Hemorrhoids   . Hiatal hernia   . HTN (hypertension)   . Hyperlipidemia   . Obesity   . Osteoarthritis   . Tubular adenoma of colon     Surgeries: Procedure(s): Right TOTAL KNEE ARTHROPLASTY on 04/14/2019   Consultants:   Discharged Condition: Improved  Hospital Course: [name] is an 73 y.o. female who was admitted 04/14/2019 for operative treatment ofPrimary osteoarthritis of right knee. Patient has severe unremitting pain that affects sleep, daily activities, and work/hobbies. After pre-op clearance the patient was taken to the operating room on 04/14/2019 and underwent  Procedure(s): Right TOTAL KNEE ARTHROPLASTY.    Patient was seen on postoperative day 1.  She was doing well.  Pain was controlled.  She was icing her knee.  She denies any shortness of breath or chest pain.  She had not cleared physical therapy yet.  The patient made slow progress with physical therapy and an additional day or 2 were needed for safety prior to discharge home.  On the date of discharge she was afebrile, her vital signs are stable.  She was taking by mouth and voiding okay.  She reported that she was ready to go home.  Does have family assistance at home.  Exam: Right knee with dressing in place.  Ice on the dressing.  No tenderness palpation distal to the dressing.  Able to dorsiflex and plantarflex ankle.  Sensation intact on the dorsal plantar foot.  Foot  is warm well perfused. Respirations even unlabored No acute distress Abdomen soft  Patient was given perioperative antibiotics:  Anti-infectives (From admission, onward)   Start     Dose/Rate Route Frequency Ordered Stop   04/14/19 1830  ceFAZolin (ANCEF) IVPB 2g/100 mL premix     2 g 200 mL/hr over 30 Minutes Intravenous Every 6 hours 04/14/19 1627 04/15/19 0116   04/14/19 0945  ceFAZolin (ANCEF) IVPB 2g/100 mL premix     2 g 200 mL/hr over 30 Minutes Intravenous On call to O.R. 04/14/19 0939 04/14/19 1310       Patient was given sequential compression devices, early ambulation, and chemoprophylaxis to prevent DVT.  Patient benefited maximally from hospital stay and there were no complications.    Recent vital signs:  Patient Vitals for the past 24 hrs:  BP Temp Temp src Pulse Resp SpO2 Height Weight  04/15/19 0506 134/90 98 F (36.7 C) Oral 80 16 100 % - -  04/15/19 0134 140/84 98.6 F (37 C) Oral 80 16 100 % - -  04/14/19 2118 (!) 155/93 98.9 F (37.2 C) Oral 70 16 100 % - -  04/14/19 1931 136/80 98.2 F (36.8 C) Oral 65 16 100 % - -  04/14/19 1819 (!) 136/92 98.2 F (36.8 C) - 71 16 100 % - -  04/14/19 1708 139/80 98 F (36.7 C) Oral 71 15 100 % - -  04/14/19 1615 140/78 98.1 F (36.7  C) Oral 72 15 97 % 5\' 3"  (1.6 m) 97.6 kg  04/14/19 1600 139/81 98 F (36.7 C) - 71 14 96 % - -  04/14/19 1545 133/81 - - 70 14 99 % - -  04/14/19 1530 (!) 142/80 98.2 F (36.8 C) - 74 20 100 % - -  04/14/19 1500 140/84 - - 78 15 98 % - -  04/14/19 1452 (!) 141/90 98.2 F (36.8 C) - 78 15 98 % - -  04/14/19 1127 - - - 74 17 98 % - -  04/14/19 1126 - - - 76 16 99 % - -  04/14/19 1125 - - - 74 18 99 % - -  04/14/19 1124 - - - 72 14 99 % - -  04/14/19 1123 - - - 75 13 98 % - -  04/14/19 1122 (!) 157/94 - - 73 15 98 % - -  04/14/19 1121 - - - 76 15 100 % - -  04/14/19 1120 - - - 79 16 100 % - -  04/14/19 1119 - - - 76 16 100 % - -  04/14/19 1118 - - - 79 17 100 % - -  04/14/19 1117  - - - 78 17 100 % - -  04/14/19 1116 - - - 79 (!) 22 100 % - -  04/14/19 1115 - - - 75 20 100 % - -  04/14/19 1114 - - - 79 (!) 21 99 % - -  04/14/19 1017 - - - - - - 5\' 3"  (1.6 m) 97.6 kg  04/14/19 0954 (!) 144/75 98.7 F (37.1 C) - 73 (!) 21 98 % - -     Recent laboratory studies:  Recent Labs    04/12/19 1005 04/15/19 0301  WBC 9.6 14.5*  HGB 12.7 11.1*  HCT 39.0 35.8*  PLT 313 306  NA 140  --   K 3.3*  --   CL 101  --   CO2 29  --   BUN 25*  --   CREATININE 0.64  --   GLUCOSE 107*  --   INR 0.9  --   CALCIUM 9.4  --      Discharge Medications:   Allergies as of 04/15/2019      Reactions   Soybean-containing Drug Products Other (See Comments)   Allergy testing positive for soy beans but pt uses soy sauce   Sulfonamide Derivatives Itching, Rash      Medication List    STOP taking these medications   AMBULATORY NON FORMULARY MEDICATION   ranitidine 150 MG tablet Commonly known as: Zantac   triamcinolone ointment 0.5 % Commonly known as: KENALOG   Turmeric 500 MG Tabs     TAKE these medications   aspirin EC 325 MG tablet Take 1 tablet (325 mg total) by mouth 2 (two) times daily after a meal. Take x 1 month post op to decrease risk of blood clots.   atorvastatin 20 MG tablet Commonly known as: LIPITOR Take 1 tablet (20 mg total) by mouth daily.   azelastine 0.1 % nasal spray Commonly known as: ASTELIN Place 1 spray into both nostrils 2 (two) times daily. Use in each nostril as directed   CALCIUM 600/VITAMIN D3 PO Take 1 tablet by mouth daily.   cetirizine 10 MG tablet Commonly known as: ZYRTEC Take 1 tablet (10 mg total) by mouth daily. As needed   diltiazem 120 MG 24 hr capsule Commonly known as: TIAZAC Take 1 capsule (  120 mg total) by mouth daily.   docusate sodium 100 MG capsule Commonly known as: Colace Take 1 capsule (100 mg total) by mouth 2 (two) times daily.   DULoxetine 20 MG capsule Commonly known as: CYMBALTA Take 2 capsules  (40 mg total) by mouth daily.   esomeprazole 40 MG capsule Commonly known as: NEXIUM TAKE 1 CAPSULE BY MOUTH EVERY DAY What changed:   how much to take  when to take this   gabapentin 100 MG capsule Commonly known as: NEURONTIN Take 2 capsules (200 mg total) by mouth at bedtime.   losartan-hydrochlorothiazide 100-25 MG tablet Commonly known as: HYZAAR Take 1 tablet by mouth daily.   metoprolol tartrate 50 MG tablet Commonly known as: LOPRESSOR Take 1 tablet (50 mg total) by mouth 2 (two) times daily.   oxyCODONE-acetaminophen 5-325 MG tablet Commonly known as: PERCOCET/ROXICET Take 1-2 tablets by mouth every 6 (six) hours as needed for severe pain.   SYSTANE OP Place 1 drop into both eyes 3 (three) times daily as needed (dry eyes).   tiZANidine 2 MG tablet Commonly known as: ZANAFLEX Take 2 mg by mouth every 6 (six) hours as needed for muscle spasms.   traMADol 50 MG tablet Commonly known as: ULTRAM Take 50-100 mg by mouth every 6 (six) hours as needed for moderate pain.       Diagnostic Studies: Dg Chest 2 View  Result Date: 04/12/2019 CLINICAL DATA:  Preop for knee surgery. EXAM: CHEST - 2 VIEW COMPARISON:  Radiographs of July 26, 2017. FINDINGS: The heart size and mediastinal contours are within normal limits. Both lungs are clear. The visualized skeletal structures are unremarkable. IMPRESSION: No active cardiopulmonary disease. Electronically Signed   By: Marijo Conception M.D.   On: 04/12/2019 16:13    Disposition: Discharge disposition: 01-Home or Self Care       Discharge Instructions    Call MD / Call 911   Complete by: As directed    If you experience chest pain or shortness of breath, CALL 911 and be transported to the hospital emergency room.  If you develope a fever above 101 F, pus (white drainage) or increased drainage or redness at the wound, or calf pain, call your surgeon's office.   Constipation Prevention   Complete by: As directed     Drink plenty of fluids.  Prune juice may be helpful.  You may use a stool softener, such as Colace (over the counter) 100 mg twice a day.  Use MiraLax (over the counter) for constipation as needed.   Diet - low sodium heart healthy   Complete by: As directed    Increase activity slowly as tolerated   Complete by: As directed       Follow-up Information    Dorna Leitz, MD. Schedule an appointment as soon as possible for a visit in 2 weeks.   Specialty: Orthopedic Surgery Contact information: [place] [address] [phone]            Signed: Erle Crocker 04/15/2019, 6:56 AM

## 2019-04-16 DIAGNOSIS — R262 Difficulty in walking, not elsewhere classified: Secondary | ICD-10-CM | POA: Diagnosis not present

## 2019-04-16 DIAGNOSIS — Z882 Allergy status to sulfonamides status: Secondary | ICD-10-CM | POA: Diagnosis not present

## 2019-04-16 DIAGNOSIS — Z79899 Other long term (current) drug therapy: Secondary | ICD-10-CM | POA: Diagnosis not present

## 2019-04-16 DIAGNOSIS — I10 Essential (primary) hypertension: Secondary | ICD-10-CM | POA: Diagnosis not present

## 2019-04-16 DIAGNOSIS — M1711 Unilateral primary osteoarthritis, right knee: Secondary | ICD-10-CM | POA: Diagnosis not present

## 2019-04-16 DIAGNOSIS — Z8249 Family history of ischemic heart disease and other diseases of the circulatory system: Secondary | ICD-10-CM | POA: Diagnosis not present

## 2019-04-16 DIAGNOSIS — K219 Gastro-esophageal reflux disease without esophagitis: Secondary | ICD-10-CM | POA: Diagnosis not present

## 2019-04-16 DIAGNOSIS — E785 Hyperlipidemia, unspecified: Secondary | ICD-10-CM | POA: Diagnosis not present

## 2019-04-16 LAB — CBC
HCT: 35.1 % — ABNORMAL LOW (ref 36.0–46.0)
Hemoglobin: 11.1 g/dL — ABNORMAL LOW (ref 12.0–15.0)
MCH: 29.4 pg (ref 26.0–34.0)
MCHC: 31.6 g/dL (ref 30.0–36.0)
MCV: 92.9 fL (ref 80.0–100.0)
Platelets: 321 10*3/uL (ref 150–400)
RBC: 3.78 MIL/uL — ABNORMAL LOW (ref 3.87–5.11)
RDW: 12.2 % (ref 11.5–15.5)
WBC: 19.8 10*3/uL — ABNORMAL HIGH (ref 4.0–10.5)
nRBC: 0 % (ref 0.0–0.2)

## 2019-04-16 NOTE — Progress Notes (Signed)
Physical Therapy Treatment Patient Details Name: [name] MRN: [id number] DOB: 06-06-1946 Today's Date: 04/16/2019    History of Present Illness s/p R TKA, HTN, OA    PT Comments    Pt continues to progress slowly.  amb short distance into hallway with +2 for chair follow for safety d/t pt continuing to  Fatigue rapidly. Pt also continues to require mod assist for sit to stand transfers including assist to prevent fall/posterior LOB on initial standing. Will see again today, however do not feel pt is ready/safe d/c today    Follow Up Recommendations  Follow surgeon's recommendation for DC plan and follow-up therapies     Equipment Recommendations  None recommended by PT    Recommendations for Other Services       Precautions / Restrictions Precautions Precautions: Fall;Knee Restrictions Weight Bearing Restrictions: No Other Position/Activity Restrictions: WBAT    Mobility  Bed Mobility Overal bed mobility: Needs Assistance Bed Mobility: Supine to Sit     Supine to sit: Min assist;HOB elevated     General bed mobility comments: incr time, assist with RLE and trunk to come to sit  Transfers Overall transfer level: Needs assistance Equipment used: Rolling walker (2 wheeled) Transfers: Sit to/from [facility] Sit to Stand: Mod assist         General transfer comment: assist to rise and transition to RW. multi-modal cues for sequencing, safety, posture   Ambulation/Gait Ambulation/Gait assistance: Min assist;+2 safety/equipment Gait Distance (Feet): 28 Feet Assistive device: Rolling walker (2 wheeled) Gait Pattern/deviations: Step-to pattern;Decreased step length - right;Decreased step length - left;Decreased weight shift to right     General Gait Details: incr time needed. multi-modal cues for sequence, trunk extension, RW position. assist needed for balance and safety   Stairs             Wheelchair Mobility    Modified  Rankin (Stroke Patients Only)       Balance             Standing balance-Leahy Scale: Poor Standing balance comment: reliant on UEs and external support                            Cognition Arousal/Alertness: Awake/alert Behavior During Therapy: WFL for tasks assessed/performed Overall Cognitive Status: Within Functional Limits for tasks assessed                                        Exercises      General Comments        Pertinent Vitals/Pain Pain Assessment: 0-10 Pain Score: 4  Pain Location: right knee Pain Descriptors / Indicators: Sore Pain Intervention(s): Limited activity within patient's tolerance;Monitored during session    Home Living                      Prior Function            PT Goals (current goals can now be found in the care plan section) Acute Rehab PT Goals PT Goal Formulation: With patient Time For Goal Achievement: 04/20/19 Potential to Achieve Goals: Good Progress towards PT goals: Progressing toward goals    Frequency    7X/week      PT Plan Current plan remains appropriate;Other (comment)(? may need SNF)    Co-evaluation  AM-PAC PT "6 Clicks" Mobility   Outcome Measure  Help needed turning from your back to your side while in a flat bed without using bedrails?: A Lot Help needed moving from lying on your back to sitting on the side of a flat bed without using bedrails?: A Lot Help needed moving to and from a bed to a chair (including a wheelchair)?: A Lot Help needed standing up from a chair using your arms (e.g., wheelchair or bedside chair)?: A Lot Help needed to walk in hospital room?: A Lot Help needed climbing 3-5 steps with a railing? : Total 6 Click Score: 11    End of Session Equipment Utilized During Treatment: Gait belt Activity Tolerance: Patient limited by fatigue;Patient tolerated treatment well Patient left: with call bell/phone within reach;in  bed;with bed alarm set Nurse Communication: Mobility status PT Visit Diagnosis: Difficulty in walking, not elsewhere classified (R26.2)     Time: 6681-5947 PT Time Calculation (min) (ACUTE ONLY): 15 min  Charges:  $Gait Training: 8-22 mins                     Kenyon Ana, PT  Pager: [phone] Acute Rehab Dept [facility]): [phone]   04/16/2019    [facility] 04/16/2019, 2:56 PM

## 2019-04-16 NOTE — Progress Notes (Signed)
04/16/19 1500  PT Visit Information  Last PT Received On 04/16/19  Assistance Needed +2  History of Present Illness s/p R TKA, HTN, OA  Precautions  Precautions Fall;Knee  Restrictions  Weight Bearing Restrictions No  Other Position/Activity Restrictions WBAT  Pain Assessment  Pain Assessment 0-10  Pain Score 3  Pain Location right knee  Pain Descriptors / Indicators Sore  Pain Intervention(s) Limited activity within patient's tolerance;Monitored during session;Repositioned  Cognition  Arousal/Alertness Awake/alert  Behavior During Therapy WFL for tasks assessed/performed  Overall Cognitive Status Within Functional Limits for tasks assessed  Bed Mobility  Overal bed mobility Needs Assistance  Bed Mobility Sit to Supine  Sit to supine Mod assist  General bed mobility comments assist with RLE and trunk, incr time, cues for scooting and positioning  Transfers  Overall transfer level Needs assistance  Equipment used Rolling walker (2 wheeled)  Transfers Sit to/from Stand  Sit to Stand Mod assist;Max assist  General transfer comment assist to rise and transition to RW. multi-modal cues for sequencing, safety, posture; assist to prevent posterior LOB upon initial standing   Ambulation/Gait  Ambulation/Gait assistance Min assist;+2 safety/equipment  Gait Distance (Feet) 16 Feet  Assistive device Rolling walker (2 wheeled)  Gait Pattern/deviations Step-to pattern;Decreased step length - right;Decreased step length - left;Decreased weight shift to right  General Gait Details incr time needed. multi-modal cues for sequence, trunk extension, RW position. assist needed for balance and safety  Gait velocity decr  Balance  Sitting-balance support Feet supported;No upper extremity supported  Sitting balance-Leahy Scale Fair  Standing balance-Leahy Scale Poor  Standing balance comment reliant on UEs and external support  Total Joint Exercises  Ankle Circles/Pumps AROM;10 reps;Both   Quad Sets AROM;Both;10 reps  Heel Slides AAROM;Right;10 reps  Hip ABduction/ADduction AAROM;Right;10 reps  Straight Leg Raises AAROM;Right;10 reps  Goniometric ROM grossly 6* to 65* AAROM right knee  PT - End of Session  Equipment Utilized During Treatment Gait belt  Activity Tolerance Patient limited by fatigue;Patient tolerated treatment well  Patient left with call bell/phone within reach;in bed;with bed alarm set  Nurse Communication Mobility status   PT - Assessment/Plan  PT Plan Current plan remains appropriate;Other (comment)  PT Visit Diagnosis Difficulty in walking, not elsewhere classified (R26.2)  PT Frequency (ACUTE ONLY) 7X/week  Follow Up Recommendations Follow surgeon's recommendation for DC plan and follow-up therapies;Supervision/Assistance - 24 hour  PT equipment None recommended by PT  AM-PAC PT "6 Clicks" Mobility Outcome Measure (Version 2)  Help needed turning from your back to your side while in a flat bed without using bedrails? 2  Help needed moving from lying on your back to sitting on the side of a flat bed without using bedrails? 2  Help needed moving to and from a bed to a chair (including a wheelchair)? 2  Help needed standing up from a chair using your arms (e.g., wheelchair or bedside chair)? 2  Help needed to walk in hospital room? 2  Help needed climbing 3-5 steps with a railing?  1  6 Click Score 11  Consider Recommendation of Discharge To: CIR/SNF/LTACH  Acute Rehab PT Goals  PT Goal Formulation With patient  Time For Goal Achievement 04/20/19  Potential to Achieve Goals Good  PT Time Calculation  PT Start Time (ACUTE ONLY) 1500  PT Stop Time (ACUTE ONLY) 1521  PT Time Calculation (min) (ACUTE ONLY) 21 min  PT General Charges  $$ ACUTE PT VISIT 1 Visit  PT Treatments  $Therapeutic Exercise  8-22 mins

## 2019-04-16 NOTE — Progress Notes (Signed)
[name] is a 73 y.o. female   Orthopaedic diagnosis: Status post right total knee arthroplasty  Subjective: Patient is resting comfortably today.  She states she was able to mobilize some with physical therapy.  She is hoping to go home with assistance by family members.  Denies any shortness of breath or chest pain.  Objectyive: Vitals:   04/15/19 2150 04/16/19 0616  BP: (!) 143/79 (!) 146/88  Pulse: 73 68  Resp: 16 16  Temp: 98.2 F (36.8 C) 97.6 F (36.4 C)  SpO2: 96% 95%     Exam: Awake and alert Respirations even and unlabored No acute distress  Right leg dressing in place.  No drainage noted.  Ice on her knee.  No tenderness to palpation about the calf, ankle or foot.  She is able dorsiflex plantarflex the ankle.  Sensation tachycardia dorsal plantar foot.  Palpable dorsalis pedis pulse.  Assessment: Postop day 2 status post right total knee arthroplasty with difficulty mobilizing with therapy   Plan: She will work with therapy again this morning.  Hopefully she will be suitable for discharge home with family help as that which she would like. Discharge summary was performed yesterday. Medications have been sent to her pharmacy. She will be discharged on aspirin daily Follow-up with Dr. Berenice Primas as scheduled.   Radene Journey, MD

## 2019-04-17 ENCOUNTER — Encounter (HOSPITAL_COMMUNITY): Payer: Self-pay | Admitting: Orthopedic Surgery

## 2019-04-17 ENCOUNTER — Telehealth: Payer: Self-pay

## 2019-04-17 DIAGNOSIS — Z79899 Other long term (current) drug therapy: Secondary | ICD-10-CM | POA: Diagnosis not present

## 2019-04-17 DIAGNOSIS — R262 Difficulty in walking, not elsewhere classified: Secondary | ICD-10-CM | POA: Diagnosis not present

## 2019-04-17 DIAGNOSIS — Z96651 Presence of right artificial knee joint: Secondary | ICD-10-CM | POA: Diagnosis not present

## 2019-04-17 DIAGNOSIS — I10 Essential (primary) hypertension: Secondary | ICD-10-CM | POA: Diagnosis not present

## 2019-04-17 DIAGNOSIS — E785 Hyperlipidemia, unspecified: Secondary | ICD-10-CM | POA: Diagnosis not present

## 2019-04-17 DIAGNOSIS — Z882 Allergy status to sulfonamides status: Secondary | ICD-10-CM | POA: Diagnosis not present

## 2019-04-17 DIAGNOSIS — K219 Gastro-esophageal reflux disease without esophagitis: Secondary | ICD-10-CM | POA: Diagnosis not present

## 2019-04-17 DIAGNOSIS — M1711 Unilateral primary osteoarthritis, right knee: Secondary | ICD-10-CM | POA: Diagnosis not present

## 2019-04-17 DIAGNOSIS — Z8249 Family history of ischemic heart disease and other diseases of the circulatory system: Secondary | ICD-10-CM | POA: Diagnosis not present

## 2019-04-17 LAB — CBC
HCT: 32.8 % — ABNORMAL LOW (ref 36.0–46.0)
Hemoglobin: 10.3 g/dL — ABNORMAL LOW (ref 12.0–15.0)
MCH: 29.2 pg (ref 26.0–34.0)
MCHC: 31.4 g/dL (ref 30.0–36.0)
MCV: 92.9 fL (ref 80.0–100.0)
Platelets: 287 10*3/uL (ref 150–400)
RBC: 3.53 MIL/uL — ABNORMAL LOW (ref 3.87–5.11)
RDW: 12.3 % (ref 11.5–15.5)
WBC: 16.1 10*3/uL — ABNORMAL HIGH (ref 4.0–10.5)
nRBC: 0 % (ref 0.0–0.2)

## 2019-04-17 NOTE — Progress Notes (Signed)
Subjective: 3 Days Post-Op Procedure(s) (LRB): TOTAL KNEE ARTHROPLASTY (Right) Patient reports pain as mild.  Made slow progress with physical therapy.  Taking by mouth and voiding okay.  She feels that she is ready to go home today.  Objective: Vital signs in last 24 hours: Temp:  [98 F (36.7 C)-98.2 F (36.8 C)] 98.1 F (36.7 C) (06/29 0441) Pulse Rate:  [65-72] 65 (06/29 0441) Resp:  [16-18] 16 (06/29 0441) BP: (121-146)/(65-87) 146/87 (06/29 0441) SpO2:  [94 %-98 %] 98 % (06/29 0441)  Intake/Output from previous day: 06/28 0701 - 06/29 0700 In: 360 [P.O.:360] Out: 800 [Urine:800] Intake/Output this shift: No intake/output data recorded.  Recent Labs    04/15/19 0301 04/16/19 0323 04/17/19 0335  HGB 11.1* 11.1* 10.3*   Recent Labs    04/16/19 0323 04/17/19 0335  WBC 19.8* 16.1*  RBC 3.78* 3.53*  HCT 35.1* 32.8*  PLT 321 287   No results for input(s): NA, K, CL, CO2, BUN, CREATININE, GLUCOSE, CALCIUM in the last 72 hours. No results for input(s): LABPT, INR in the last 72 hours. Right knee exam: Neurovascular intact Sensation intact distally Intact pulses distally Dorsiflexion/Plantar flexion intact Incision: dressing C/D/I Compartment soft   Assessment/Plan: 3 Days Post-Op Procedure(s) (LRB): TOTAL KNEE ARTHROPLASTY (Right) Plan: Aspirin 325 mg twice daily with food x1 month for DVT prophylaxis. Weight-bear as tolerated on right. Up with therapy Discharge home with home health Follow up with Dr. Berenice Primas in 2 weeks.  Anticipated LOS equal to or greater than 2 midnights due to - Age 72 and older with one or more of the following:  - Obesity  - Expected need for hospital services (PT, OT, Nursing) required for safe  discharge  - Anticipated need for postoperative skilled nursing care or inpatient rehab  - Active co-morbidities: None OR   - Unanticipated findings during/Post Surgery: Slow post-op progression: GI, pain control, mobility  - Patient is  a high risk of re-admission due to: None    Erlene Senters 04/17/2019, 8:17 AM

## 2019-04-17 NOTE — Progress Notes (Signed)
Physical Therapy Treatment Patient Details Name: [name] MRN: [id number] DOB: 04-18-1946 Today's Date: 04/17/2019    History of Present Illness s/p R TKA, HTN, OA    PT Comments    Continue to progress slowly. incr gait distance/tolerance today. Emphasis on transfers/safety during session in addition to review of HEP. Continue to recommend assist with any and all mobility. RN made aware also  Follow Up Recommendations  Follow surgeon's recommendation for DC plan and follow-up therapies;Supervision/Assistance - 24 hour     Equipment Recommendations  None recommended by PT    Recommendations for Other Services       Precautions / Restrictions Precautions Precautions: Fall;Knee Restrictions Weight Bearing Restrictions: No Other Position/Activity Restrictions: WBAT    Mobility  Bed Mobility               General bed mobility comments: in recliner on arrival  Transfers Overall transfer level: Needs assistance Equipment used: Rolling walker (2 wheeled) Transfers: Sit to/from Stand Sit to Stand: Mod assist;Min assist         General transfer comment: assist to rise and transition to RW. multi-modal cues for sequencing, safety, posture; assist to prevent posterior LOB upon initial standing (2 standing trials for instruction and activity tolerance)  Ambulation/Gait Ambulation/Gait assistance: Min assist Gait Distance (Feet): 34 Feet Assistive device: Rolling walker (2 wheeled) Gait Pattern/deviations: Step-to pattern;Decreased step length - right;Decreased step length - left;Decreased weight shift to right Gait velocity: decr   General Gait Details: incr time needed. multi-modal cues for sequence, trunk extension, RW position. assist needed for balance and safety   Stairs             Wheelchair Mobility    Modified Rankin (Stroke Patients Only)       Balance   Sitting-balance support: Feet supported;No upper extremity  supported Sitting balance-Leahy Scale: Fair       Standing balance-Leahy Scale: Poor Standing balance comment: reliant on UEs and external support                            Cognition Arousal/Alertness: Awake/alert Behavior During Therapy: WFL for tasks assessed/performed Overall Cognitive Status: Within Functional Limits for tasks assessed                                        Exercises Total Joint Exercises Ankle Circles/Pumps: AROM;10 reps;Both Quad Sets: AROM;Both;10 reps Heel Slides: AAROM;Right;10 reps Hip ABduction/ADduction: AAROM;Right;10 reps Straight Leg Raises: AAROM;Right;10 reps Goniometric ROM: grossly 8* to 65* right knee flexion aarom    General Comments        Pertinent Vitals/Pain Pain Assessment: 0-10 Pain Score: 3  Pain Location: right knee Pain Descriptors / Indicators: Sore Pain Intervention(s): Limited activity within patient's tolerance;Monitored during session;Ice applied    Home Living                      Prior Function            PT Goals (current goals can now be found in the care plan section) Acute Rehab PT Goals PT Goal Formulation: With patient Time For Goal Achievement: 04/20/19 Potential to Achieve Goals: Good Progress towards PT goals: Progressing toward goals    Frequency    7X/week      PT Plan Current plan remains appropriate;Other (comment)  Co-evaluation              AM-PAC PT "6 Clicks" Mobility   Outcome Measure  Help needed turning from your back to your side while in a flat bed without using bedrails?: A Lot Help needed moving from lying on your back to sitting on the side of a flat bed without using bedrails?: A Lot Help needed moving to and from a bed to a chair (including a wheelchair)?: A Lot Help needed standing up from a chair using your arms (e.g., wheelchair or bedside chair)?: A Lot Help needed to walk in hospital room?: A Little Help needed climbing  3-5 steps with a railing? : Total 6 Click Score: 12    End of Session Equipment Utilized During Treatment: Gait belt Activity Tolerance: Patient limited by fatigue;Patient tolerated treatment well Patient left: with call bell/phone within reach;in bed;with bed alarm set Nurse Communication: Mobility status PT Visit Diagnosis: Difficulty in walking, not elsewhere classified (R26.2)     Time: 1010-1048 PT Time Calculation (min) (ACUTE ONLY): 38 min  Charges:  $Gait Training: 23-37 mins $Therapeutic Exercise: 8-22 mins                     Kenyon Ana, PT  Pager: [phone] Acute Rehab Dept [facility]): [phone]   04/17/2019    [facility] 04/17/2019, 12:06 PM

## 2019-04-17 NOTE — Telephone Encounter (Signed)
Pt is on TCM List after dc on 04/17/2019. Admitted for total knee arthroplasty on 04/14/2019. Pt to follow up with Ortho in 2 weeks.

## 2019-04-18 DIAGNOSIS — J309 Allergic rhinitis, unspecified: Secondary | ICD-10-CM | POA: Diagnosis not present

## 2019-04-18 DIAGNOSIS — M4726 Other spondylosis with radiculopathy, lumbar region: Secondary | ICD-10-CM | POA: Diagnosis not present

## 2019-04-18 DIAGNOSIS — Z471 Aftercare following joint replacement surgery: Secondary | ICD-10-CM | POA: Diagnosis not present

## 2019-04-18 DIAGNOSIS — E785 Hyperlipidemia, unspecified: Secondary | ICD-10-CM | POA: Diagnosis not present

## 2019-04-18 DIAGNOSIS — K5790 Diverticulosis of intestine, part unspecified, without perforation or abscess without bleeding: Secondary | ICD-10-CM | POA: Diagnosis not present

## 2019-04-18 DIAGNOSIS — K219 Gastro-esophageal reflux disease without esophagitis: Secondary | ICD-10-CM | POA: Diagnosis not present

## 2019-04-18 DIAGNOSIS — Z96651 Presence of right artificial knee joint: Secondary | ICD-10-CM | POA: Diagnosis not present

## 2019-04-18 DIAGNOSIS — I10 Essential (primary) hypertension: Secondary | ICD-10-CM | POA: Diagnosis not present

## 2019-04-18 DIAGNOSIS — D649 Anemia, unspecified: Secondary | ICD-10-CM | POA: Diagnosis not present

## 2019-04-18 DIAGNOSIS — Z9181 History of falling: Secondary | ICD-10-CM | POA: Diagnosis not present

## 2019-04-18 DIAGNOSIS — M1712 Unilateral primary osteoarthritis, left knee: Secondary | ICD-10-CM | POA: Diagnosis not present

## 2019-04-18 DIAGNOSIS — M19012 Primary osteoarthritis, left shoulder: Secondary | ICD-10-CM | POA: Diagnosis not present

## 2019-04-19 ENCOUNTER — Encounter: Payer: Medicare Other | Admitting: Internal Medicine

## 2019-04-19 ENCOUNTER — Encounter: Payer: Self-pay | Admitting: Internal Medicine

## 2019-04-19 ENCOUNTER — Ambulatory Visit (INDEPENDENT_AMBULATORY_CARE_PROVIDER_SITE_OTHER): Payer: Medicare Other | Admitting: Internal Medicine

## 2019-04-19 DIAGNOSIS — E538 Deficiency of other specified B group vitamins: Secondary | ICD-10-CM | POA: Diagnosis not present

## 2019-04-19 DIAGNOSIS — R739 Hyperglycemia, unspecified: Secondary | ICD-10-CM

## 2019-04-19 DIAGNOSIS — E611 Iron deficiency: Secondary | ICD-10-CM | POA: Diagnosis not present

## 2019-04-19 DIAGNOSIS — Z0000 Encounter for general adult medical examination without abnormal findings: Secondary | ICD-10-CM | POA: Diagnosis not present

## 2019-04-19 DIAGNOSIS — E559 Vitamin D deficiency, unspecified: Secondary | ICD-10-CM

## 2019-04-19 NOTE — Patient Instructions (Signed)
Please continue all other medications as before, and refills have been done if requested.  Please have the pharmacy call with any other refills you may need.  Please continue your efforts at being more active, low cholesterol diet, and weight control.  You are otherwise up to date with prevention measures today.  Please keep your appointments with your specialists as you may have planned  Please return in 3 months, or sooner if needed, with Lab testing done 3-5 days before  

## 2019-04-19 NOTE — Assessment & Plan Note (Signed)

## 2019-04-19 NOTE — Progress Notes (Signed)
Patient ID: [name], female   DOB: [birth year], 73 y.o.   MRN: [id number]  Virtual Visit via Video Note  I connected with [name] on 04/19/19 at  8:40 AM EDT by a video enabled telemedicine application and verified that I am speaking with the correct person using two identifiers.  Location: Patient: at home Provider: at office   I discussed the limitations of evaluation and management by telemedicine and the availability of in person appointments. The patient expressed understanding and agreed to proceed.  History of Present Illness: Here for wellness and f/u;  Overall doing ok;  Pt denies Chest pain, worsening SOB, DOE, wheezing, orthopnea, PND, worsening LE edema, palpitations, dizziness or syncope.  Pt denies neurological change such as new headache, facial or extremity weakness.  Pt denies polydipsia, polyuria, or low sugar symptoms. Pt states overall good compliance with treatment and medications, good tolerability, and has been trying to follow appropriate diet.  Pt denies worsening depressive symptoms, suicidal ideation or panic. No fever, night sweats, wt loss, loss of appetite, or other constitutional symptoms.  Pt states good ability with ADL's, has low fall risk, home safety reviewed and adequate, no other significant changes in hearing or vision, and now getting over knee TKR last week, able to walk fairly well without falls, fever or new pain. No new complaints Past Medical History:  Diagnosis Date  . Allergic rhinitis, cause unspecified   . Allergy    SEASONAL  . Anemia   . Cataract    Bilateral  . Chronic headaches   . Constipation   . Diverticulosis   . DJD (degenerative joint disease), lumbar   . GERD (gastroesophageal reflux disease)   . Hemorrhoids   . Hiatal hernia   . HTN (hypertension)   . Hyperlipidemia   . Obesity   . Osteoarthritis   . Tubular adenoma of colon    Past Surgical History:  Procedure Laterality Date  . ABLATION    .  COLONOSCOPY    . 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 TUBAL LIGATION      reports that she has never smoked. She has never used smokeless tobacco. She reports that she does not drink alcohol or use drugs. family history includes Breast cancer in her sister; Diabetes in her brother; Heart disease in her mother; Hypertension in her mother. Allergies  Allergen Reactions  . 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 Sulfonamide Derivatives Itching and Rash   Current Outpatient Medications on File Prior to Visit  Medication Sig Dispense Refill  . aspirin EC 325 MG tablet Take 1 tablet (325 mg total) by mouth 2 (two) times daily after a meal. Take x 1 month post op to decrease risk of blood clots. 60 tablet 0  . atorvastatin (LIPITOR) 20 MG tablet Take 1 tablet (20 mg total) by mouth daily. 90 tablet 1  . azelastine (ASTELIN) 0.1 % nasal spray Place 1 spray into both nostrils 2 (two) times daily. Use in each nostril as directed    . Calcium Carb-Cholecalciferol (CALCIUM 600/VITAMIN D3 PO) Take 1 tablet by mouth daily.    . cetirizine (ZYRTEC) 10 MG tablet Take 1 tablet (10 mg total) by mouth daily. As needed 90 tablet 3  . diltiazem (TIAZAC) 120 MG 24 hr  capsule Take 1 capsule (120 mg total) by mouth daily. 90 capsule 1  . docusate sodium (COLACE) 100 MG capsule Take 1 capsule (100 mg total) by mouth 2 (two) times daily. 30 capsule 0  . DULoxetine (CYMBALTA) 20 MG capsule Take 2 capsules (40 mg total) by mouth daily. 180 capsule 1  . esomeprazole (NEXIUM) 40 MG capsule TAKE 1 CAPSULE BY MOUTH EVERY DAY (Patient taking differently: Take 40 mg by mouth daily at 12 noon. ) 30 capsule 1  . gabapentin (NEURONTIN) 100 MG capsule Take 2 capsules (200 mg total) by mouth at bedtime. 60 capsule 3  .  losartan-hydrochlorothiazide (HYZAAR) 100-25 MG tablet Take 1 tablet by mouth daily. 90 tablet 1  . metoprolol tartrate (LOPRESSOR) 50 MG tablet Take 1 tablet (50 mg total) by mouth 2 (two) times daily. 180 tablet 1  . oxyCODONE-acetaminophen (PERCOCET/ROXICET) 5-325 MG tablet Take 1-2 tablets by mouth every 6 (six) hours as needed for severe pain. 40 tablet 0  . Polyethyl Glycol-Propyl Glycol (SYSTANE OP) Place 1 drop into both eyes 3 (three) times daily as needed (dry eyes).    Marland Kitchen tiZANidine (ZANAFLEX) 2 MG tablet Take 2 mg by mouth every 6 (six) hours as needed for muscle spasms.    . traMADol (ULTRAM) 50 MG tablet Take 50-100 mg by mouth every 6 (six) hours as needed for moderate pain.   0   No current facility-administered medications on file prior to visit.     Observations/Objective: Alert, NAD, appropriate mood and affect, resps normal, cn 2-12 intact, moves all 4s, no visible rash or swelling Lab Results  Component Value Date   WBC 16.1 (H) 04/17/2019   HGB 10.3 (L) 04/17/2019   HCT 32.8 (L) 04/17/2019   PLT 287 04/17/2019   GLUCOSE 107 (H) 04/12/2019   CHOL 198 04/13/2018   TRIG 189.0 (H) 04/13/2018   HDL 57.70 04/13/2018   LDLDIRECT 168.8 01/20/2012   LDLCALC 102 (H) 04/13/2018   ALT 20 04/12/2019   AST 16 04/12/2019   NA 140 04/12/2019   K 3.3 (L) 04/12/2019   CL 101 04/12/2019   CREATININE 0.64 04/12/2019   BUN 25 (H) 04/12/2019   CO2 29 04/12/2019   TSH 0.67 04/13/2018   INR 0.9 04/12/2019   HGBA1C 6.6 (H) 04/13/2018   Assessment and Plan: See notes  Follow Up Instructions: See notes   I discussed the assessment and treatment plan with the patient. The patient was provided an opportunity to ask questions and all were answered. The patient agreed with the plan and demonstrated an understanding of the instructions.   The patient was advised to call back or seek an in-person evaluation if the symptoms worsen or if the condition fails to improve as  anticipated.   [name], MD

## 2019-04-19 NOTE — Assessment & Plan Note (Signed)
stable overall by history and exam, recent data reviewed with pt, and pt to continue medical treatment as before,  to f/u any worsening symptoms or concerns  

## 2019-04-20 DIAGNOSIS — M19012 Primary osteoarthritis, left shoulder: Secondary | ICD-10-CM | POA: Diagnosis not present

## 2019-04-20 DIAGNOSIS — I10 Essential (primary) hypertension: Secondary | ICD-10-CM | POA: Diagnosis not present

## 2019-04-20 DIAGNOSIS — J309 Allergic rhinitis, unspecified: Secondary | ICD-10-CM | POA: Diagnosis not present

## 2019-04-20 DIAGNOSIS — E785 Hyperlipidemia, unspecified: Secondary | ICD-10-CM | POA: Diagnosis not present

## 2019-04-20 DIAGNOSIS — Z9181 History of falling: Secondary | ICD-10-CM | POA: Diagnosis not present

## 2019-04-20 DIAGNOSIS — K219 Gastro-esophageal reflux disease without esophagitis: Secondary | ICD-10-CM | POA: Diagnosis not present

## 2019-04-20 DIAGNOSIS — Z471 Aftercare following joint replacement surgery: Secondary | ICD-10-CM | POA: Diagnosis not present

## 2019-04-20 DIAGNOSIS — M4726 Other spondylosis with radiculopathy, lumbar region: Secondary | ICD-10-CM | POA: Diagnosis not present

## 2019-04-20 DIAGNOSIS — Z96651 Presence of right artificial knee joint: Secondary | ICD-10-CM | POA: Diagnosis not present

## 2019-04-20 DIAGNOSIS — M1712 Unilateral primary osteoarthritis, left knee: Secondary | ICD-10-CM | POA: Diagnosis not present

## 2019-04-20 DIAGNOSIS — K5790 Diverticulosis of intestine, part unspecified, without perforation or abscess without bleeding: Secondary | ICD-10-CM | POA: Diagnosis not present

## 2019-04-20 DIAGNOSIS — D649 Anemia, unspecified: Secondary | ICD-10-CM | POA: Diagnosis not present

## 2019-04-21 DIAGNOSIS — D649 Anemia, unspecified: Secondary | ICD-10-CM | POA: Diagnosis not present

## 2019-04-21 DIAGNOSIS — M19012 Primary osteoarthritis, left shoulder: Secondary | ICD-10-CM | POA: Diagnosis not present

## 2019-04-21 DIAGNOSIS — K219 Gastro-esophageal reflux disease without esophagitis: Secondary | ICD-10-CM | POA: Diagnosis not present

## 2019-04-21 DIAGNOSIS — E785 Hyperlipidemia, unspecified: Secondary | ICD-10-CM | POA: Diagnosis not present

## 2019-04-21 DIAGNOSIS — Z96651 Presence of right artificial knee joint: Secondary | ICD-10-CM | POA: Diagnosis not present

## 2019-04-21 DIAGNOSIS — Z9181 History of falling: Secondary | ICD-10-CM | POA: Diagnosis not present

## 2019-04-21 DIAGNOSIS — J309 Allergic rhinitis, unspecified: Secondary | ICD-10-CM | POA: Diagnosis not present

## 2019-04-21 DIAGNOSIS — Z471 Aftercare following joint replacement surgery: Secondary | ICD-10-CM | POA: Diagnosis not present

## 2019-04-21 DIAGNOSIS — K5790 Diverticulosis of intestine, part unspecified, without perforation or abscess without bleeding: Secondary | ICD-10-CM | POA: Diagnosis not present

## 2019-04-21 DIAGNOSIS — M1712 Unilateral primary osteoarthritis, left knee: Secondary | ICD-10-CM | POA: Diagnosis not present

## 2019-04-21 DIAGNOSIS — M4726 Other spondylosis with radiculopathy, lumbar region: Secondary | ICD-10-CM | POA: Diagnosis not present

## 2019-04-21 DIAGNOSIS — I10 Essential (primary) hypertension: Secondary | ICD-10-CM | POA: Diagnosis not present

## 2019-04-24 DIAGNOSIS — K5790 Diverticulosis of intestine, part unspecified, without perforation or abscess without bleeding: Secondary | ICD-10-CM | POA: Diagnosis not present

## 2019-04-24 DIAGNOSIS — I10 Essential (primary) hypertension: Secondary | ICD-10-CM | POA: Diagnosis not present

## 2019-04-24 DIAGNOSIS — Z471 Aftercare following joint replacement surgery: Secondary | ICD-10-CM | POA: Diagnosis not present

## 2019-04-24 DIAGNOSIS — Z9181 History of falling: Secondary | ICD-10-CM | POA: Diagnosis not present

## 2019-04-24 DIAGNOSIS — M1712 Unilateral primary osteoarthritis, left knee: Secondary | ICD-10-CM | POA: Diagnosis not present

## 2019-04-24 DIAGNOSIS — E785 Hyperlipidemia, unspecified: Secondary | ICD-10-CM | POA: Diagnosis not present

## 2019-04-24 DIAGNOSIS — J309 Allergic rhinitis, unspecified: Secondary | ICD-10-CM | POA: Diagnosis not present

## 2019-04-24 DIAGNOSIS — K219 Gastro-esophageal reflux disease without esophagitis: Secondary | ICD-10-CM | POA: Diagnosis not present

## 2019-04-24 DIAGNOSIS — M1711 Unilateral primary osteoarthritis, right knee: Secondary | ICD-10-CM | POA: Diagnosis not present

## 2019-04-24 DIAGNOSIS — M4726 Other spondylosis with radiculopathy, lumbar region: Secondary | ICD-10-CM | POA: Diagnosis not present

## 2019-04-24 DIAGNOSIS — Z96651 Presence of right artificial knee joint: Secondary | ICD-10-CM | POA: Diagnosis not present

## 2019-04-24 DIAGNOSIS — M19012 Primary osteoarthritis, left shoulder: Secondary | ICD-10-CM | POA: Diagnosis not present

## 2019-04-24 DIAGNOSIS — D649 Anemia, unspecified: Secondary | ICD-10-CM | POA: Diagnosis not present

## 2019-04-26 DIAGNOSIS — J309 Allergic rhinitis, unspecified: Secondary | ICD-10-CM | POA: Diagnosis not present

## 2019-04-26 DIAGNOSIS — Z96651 Presence of right artificial knee joint: Secondary | ICD-10-CM | POA: Diagnosis not present

## 2019-04-26 DIAGNOSIS — M1712 Unilateral primary osteoarthritis, left knee: Secondary | ICD-10-CM | POA: Diagnosis not present

## 2019-04-26 DIAGNOSIS — Z471 Aftercare following joint replacement surgery: Secondary | ICD-10-CM | POA: Diagnosis not present

## 2019-04-26 DIAGNOSIS — I10 Essential (primary) hypertension: Secondary | ICD-10-CM | POA: Diagnosis not present

## 2019-04-26 DIAGNOSIS — K219 Gastro-esophageal reflux disease without esophagitis: Secondary | ICD-10-CM | POA: Diagnosis not present

## 2019-04-26 DIAGNOSIS — D649 Anemia, unspecified: Secondary | ICD-10-CM | POA: Diagnosis not present

## 2019-04-26 DIAGNOSIS — M19012 Primary osteoarthritis, left shoulder: Secondary | ICD-10-CM | POA: Diagnosis not present

## 2019-04-26 DIAGNOSIS — K5790 Diverticulosis of intestine, part unspecified, without perforation or abscess without bleeding: Secondary | ICD-10-CM | POA: Diagnosis not present

## 2019-04-26 DIAGNOSIS — Z9181 History of falling: Secondary | ICD-10-CM | POA: Diagnosis not present

## 2019-04-26 DIAGNOSIS — E785 Hyperlipidemia, unspecified: Secondary | ICD-10-CM | POA: Diagnosis not present

## 2019-04-26 DIAGNOSIS — M4726 Other spondylosis with radiculopathy, lumbar region: Secondary | ICD-10-CM | POA: Diagnosis not present

## 2019-04-27 DIAGNOSIS — Z471 Aftercare following joint replacement surgery: Secondary | ICD-10-CM | POA: Diagnosis not present

## 2019-04-27 DIAGNOSIS — Z96651 Presence of right artificial knee joint: Secondary | ICD-10-CM | POA: Diagnosis not present

## 2019-04-27 DIAGNOSIS — M1712 Unilateral primary osteoarthritis, left knee: Secondary | ICD-10-CM | POA: Diagnosis not present

## 2019-04-27 DIAGNOSIS — J309 Allergic rhinitis, unspecified: Secondary | ICD-10-CM | POA: Diagnosis not present

## 2019-04-27 DIAGNOSIS — I10 Essential (primary) hypertension: Secondary | ICD-10-CM | POA: Diagnosis not present

## 2019-04-27 DIAGNOSIS — M4726 Other spondylosis with radiculopathy, lumbar region: Secondary | ICD-10-CM | POA: Diagnosis not present

## 2019-04-27 DIAGNOSIS — K5790 Diverticulosis of intestine, part unspecified, without perforation or abscess without bleeding: Secondary | ICD-10-CM | POA: Diagnosis not present

## 2019-04-27 DIAGNOSIS — K219 Gastro-esophageal reflux disease without esophagitis: Secondary | ICD-10-CM | POA: Diagnosis not present

## 2019-04-27 DIAGNOSIS — D649 Anemia, unspecified: Secondary | ICD-10-CM | POA: Diagnosis not present

## 2019-04-27 DIAGNOSIS — Z9181 History of falling: Secondary | ICD-10-CM | POA: Diagnosis not present

## 2019-04-27 DIAGNOSIS — M19012 Primary osteoarthritis, left shoulder: Secondary | ICD-10-CM | POA: Diagnosis not present

## 2019-04-27 DIAGNOSIS — E785 Hyperlipidemia, unspecified: Secondary | ICD-10-CM | POA: Diagnosis not present

## 2019-04-28 DIAGNOSIS — M1711 Unilateral primary osteoarthritis, right knee: Secondary | ICD-10-CM | POA: Diagnosis not present

## 2019-05-04 DIAGNOSIS — Z96651 Presence of right artificial knee joint: Secondary | ICD-10-CM | POA: Diagnosis not present

## 2019-05-04 DIAGNOSIS — M1711 Unilateral primary osteoarthritis, right knee: Secondary | ICD-10-CM | POA: Diagnosis not present

## 2019-05-09 DIAGNOSIS — Z96651 Presence of right artificial knee joint: Secondary | ICD-10-CM | POA: Diagnosis not present

## 2019-05-09 DIAGNOSIS — M1711 Unilateral primary osteoarthritis, right knee: Secondary | ICD-10-CM | POA: Diagnosis not present

## 2019-05-11 DIAGNOSIS — M1711 Unilateral primary osteoarthritis, right knee: Secondary | ICD-10-CM | POA: Diagnosis not present

## 2019-05-11 DIAGNOSIS — M1712 Unilateral primary osteoarthritis, left knee: Secondary | ICD-10-CM | POA: Diagnosis not present

## 2019-05-12 DIAGNOSIS — M1711 Unilateral primary osteoarthritis, right knee: Secondary | ICD-10-CM | POA: Diagnosis not present

## 2019-05-12 DIAGNOSIS — Z96651 Presence of right artificial knee joint: Secondary | ICD-10-CM | POA: Diagnosis not present

## 2019-05-15 DIAGNOSIS — M1711 Unilateral primary osteoarthritis, right knee: Secondary | ICD-10-CM | POA: Diagnosis not present

## 2019-05-15 DIAGNOSIS — Z96651 Presence of right artificial knee joint: Secondary | ICD-10-CM | POA: Diagnosis not present

## 2019-05-17 ENCOUNTER — Ambulatory Visit: Payer: Medicare HMO | Admitting: Internal Medicine

## 2019-05-17 DIAGNOSIS — M1711 Unilateral primary osteoarthritis, right knee: Secondary | ICD-10-CM | POA: Diagnosis not present

## 2019-05-17 DIAGNOSIS — Z96651 Presence of right artificial knee joint: Secondary | ICD-10-CM | POA: Diagnosis not present

## 2019-05-22 DIAGNOSIS — Z96651 Presence of right artificial knee joint: Secondary | ICD-10-CM | POA: Diagnosis not present

## 2019-05-22 DIAGNOSIS — M1711 Unilateral primary osteoarthritis, right knee: Secondary | ICD-10-CM | POA: Diagnosis not present

## 2019-05-23 ENCOUNTER — Other Ambulatory Visit: Payer: Self-pay | Admitting: Internal Medicine

## 2019-05-24 ENCOUNTER — Telehealth: Payer: Self-pay

## 2019-05-24 DIAGNOSIS — R21 Rash and other nonspecific skin eruption: Secondary | ICD-10-CM

## 2019-05-24 DIAGNOSIS — M48062 Spinal stenosis, lumbar region with neurogenic claudication: Secondary | ICD-10-CM | POA: Diagnosis not present

## 2019-05-24 NOTE — Telephone Encounter (Signed)
Ok this is done 

## 2019-05-24 NOTE — Addendum Note (Signed)
Addended by: Biagio Borg on: 05/24/2019 05:02 PM   Modules accepted: Orders

## 2019-05-24 NOTE — Telephone Encounter (Signed)
Copied from [id number]. Topic: Referral - Request for Referral >> May 24, 2019  2:00 PM Leward Quan A wrote: Has patient seen PCP for this complaint? Yes.   *If NO, is insurance requiring patient see PCP for this issue before PCP can refer them? Referral for which specialty: Dermatologist  Preferred provider/office: None chosen Reason for referral: Itchy head and scalp

## 2019-05-29 ENCOUNTER — Telehealth: Payer: Self-pay | Admitting: Internal Medicine

## 2019-05-29 MED ORDER — ESOMEPRAZOLE MAGNESIUM 40 MG PO CPDR: 40.0000 mg | DELAYED_RELEASE_CAPSULE | Freq: Every day | ORAL | 0 refills | Status: DC

## 2019-05-29 NOTE — Telephone Encounter (Signed)
Pt just made appt to see Nevin Bloodgood on 8/27 and is requesting refill for esomeprazole sent to [facility] on [address].

## 2019-05-29 NOTE — Telephone Encounter (Signed)
Rx sent until appointment. 

## 2019-05-31 ENCOUNTER — Ambulatory Visit: Payer: Medicare Other

## 2019-06-01 DIAGNOSIS — Z96651 Presence of right artificial knee joint: Secondary | ICD-10-CM | POA: Diagnosis not present

## 2019-06-01 DIAGNOSIS — M1711 Unilateral primary osteoarthritis, right knee: Secondary | ICD-10-CM | POA: Diagnosis not present

## 2019-06-05 DIAGNOSIS — M1711 Unilateral primary osteoarthritis, right knee: Secondary | ICD-10-CM | POA: Diagnosis not present

## 2019-06-05 DIAGNOSIS — Z96651 Presence of right artificial knee joint: Secondary | ICD-10-CM | POA: Diagnosis not present

## 2019-06-06 DIAGNOSIS — M1711 Unilateral primary osteoarthritis, right knee: Secondary | ICD-10-CM | POA: Diagnosis not present

## 2019-06-06 DIAGNOSIS — Z96651 Presence of right artificial knee joint: Secondary | ICD-10-CM | POA: Diagnosis not present

## 2019-06-07 DIAGNOSIS — M1711 Unilateral primary osteoarthritis, right knee: Secondary | ICD-10-CM | POA: Diagnosis not present

## 2019-06-07 DIAGNOSIS — Z96651 Presence of right artificial knee joint: Secondary | ICD-10-CM | POA: Diagnosis not present

## 2019-06-12 DIAGNOSIS — Z96651 Presence of right artificial knee joint: Secondary | ICD-10-CM | POA: Diagnosis not present

## 2019-06-12 DIAGNOSIS — M1711 Unilateral primary osteoarthritis, right knee: Secondary | ICD-10-CM | POA: Diagnosis not present

## 2019-06-13 DIAGNOSIS — L299 Pruritus, unspecified: Secondary | ICD-10-CM | POA: Diagnosis not present

## 2019-06-13 DIAGNOSIS — L853 Xerosis cutis: Secondary | ICD-10-CM | POA: Diagnosis not present

## 2019-06-14 DIAGNOSIS — M1711 Unilateral primary osteoarthritis, right knee: Secondary | ICD-10-CM | POA: Diagnosis not present

## 2019-06-14 DIAGNOSIS — Z96651 Presence of right artificial knee joint: Secondary | ICD-10-CM | POA: Diagnosis not present

## 2019-06-15 ENCOUNTER — Encounter: Payer: Self-pay | Admitting: Nurse Practitioner

## 2019-06-15 ENCOUNTER — Ambulatory Visit (INDEPENDENT_AMBULATORY_CARE_PROVIDER_SITE_OTHER): Payer: Medicare Other | Admitting: Nurse Practitioner

## 2019-06-15 VITALS — BP 112/80 | HR 68 | Temp 98.3°F | Ht 63.0 in | Wt 221.0 lb

## 2019-06-15 DIAGNOSIS — K219 Gastro-esophageal reflux disease without esophagitis: Secondary | ICD-10-CM

## 2019-06-15 DIAGNOSIS — R1319 Other dysphagia: Secondary | ICD-10-CM

## 2019-06-15 DIAGNOSIS — R1310 Dysphagia, unspecified: Secondary | ICD-10-CM | POA: Diagnosis not present

## 2019-06-15 MED ORDER — ESOMEPRAZOLE MAGNESIUM 40 MG PO CPDR: 40.0000 mg | DELAYED_RELEASE_CAPSULE | Freq: Every day | ORAL | 11 refills | Status: DC

## 2019-06-15 NOTE — Progress Notes (Signed)
Chief Complaint:    GERD  IMPRESSION and PLAN:    12.  73 year old female with chronic GERD, doing well on daily Nexium.  The only time she gets heartburn is after consumption of fatty meals and she asked for something to help with that.  -Obviously the best thing to do is avoid or limit consumption of fatty foods or other trigger foods.   -For occasional breakthrough heartburn she will try Gaviscon -Continue daily Nexium, refills given  2.  Chronic intermittent dysphagia, mainly the meat and pasta.  Mildly tortuous distal esophagus on EGD in 2013, normal esophagus on EGD 2016.  Suspect some degree of esophageal dysmotility.   -I offered her an esophagram.  Patient is not interested in work-up at this time.  Feels swallowing has remained unchanged for years.  She will call for any progression of swallowing problems  3. History of adenomatous colon polyps, due for surveillance colonoscopy Feb 2021  HPI:     Patient is a 73 year old female with a history of hypertension, hyperlipidemia, obesity, iron deficiency anemia, colon polyps and GERD.  She is followed here by Dr. Hilarie Fredrickson, last office visit March 2019.   From a GI standpoint Ms. Blackard has been doing relatively well since her last visit with [place].  She has chronic, intermittent dysphagia dating back many years.  EGD for evaluation of dysphagia in 2013 showed a mildly tortuous esophagus at the distal end but otherwise exam was normal.  She had another EGD February 2016 (that time for evaluation of anemia nausea) and esophagus was normal.  Meat and pasta seem to be the most problematic.  When episodes occur she drinks water or ginger ale with resolution of symptoms.  Frequency of episodes has not changed in years  Patient is taking Nexium every day.  The only time she gets heartburn is after consumption of fatty food.  She wants to know what can be done about that  Patient recently underwent a knee replacement, she has been going  through physical therapy..   Review of systems:     No chest pain, no SOB, no fevers, no urinary sx   Past Medical History:  Diagnosis Date  . Allergic rhinitis, cause unspecified   . Allergy    SEASONAL  . Anemia   . Cataract    Bilateral  . Chronic headaches   . Constipation   . Diverticulosis   . DJD (degenerative joint disease), lumbar   . GERD (gastroesophageal reflux disease)   . Hemorrhoids   . Hiatal hernia   . HTN (hypertension)   . Hyperlipidemia   . Obesity   . Osteoarthritis   . Tubular adenoma of colon     Patient's surgical history, family medical history, social history, medications and allergies were all reviewed in Epic     Current Outpatient Medications  Medication Sig Dispense Refill  . aspirin EC 325 MG tablet Take 1 tablet (325 mg total) by mouth 2 (two) times daily after a meal. Take x 1 month post op to decrease risk of blood clots. 60 tablet 0  . atorvastatin (LIPITOR) 20 MG tablet Take 1 tablet (20 mg total) by mouth daily. 90 tablet 1  . azelastine (ASTELIN) 0.1 % nasal spray Place 1 spray into both nostrils 2 (two) times daily. Use in each nostril as directed    . Calcium Carb-Cholecalciferol (CALCIUM 600/VITAMIN D3 PO) Take 1 tablet by mouth daily.    . cetirizine (ZYRTEC) 10 MG  tablet Take 1 tablet (10 mg total) by mouth daily. As needed 90 tablet 3  . diltiazem (TIAZAC) 120 MG 24 hr capsule Take 1 capsule (120 mg total) by mouth daily. 90 capsule 1  . docusate sodium (COLACE) 100 MG capsule Take 1 capsule (100 mg total) by mouth 2 (two) times daily. 30 capsule 0  . DULoxetine (CYMBALTA) 20 MG capsule Take 2 capsules (40 mg total) by mouth daily. 180 capsule 1  . esomeprazole (NEXIUM) 40 MG capsule Take 1 capsule (40 mg total) by mouth daily. 30 capsule 11  . gabapentin (NEURONTIN) 100 MG capsule Take 2 capsules (200 mg total) by mouth at bedtime. 60 capsule 3  . losartan-hydrochlorothiazide (HYZAAR) 100-25 MG tablet TAKE 1 TABLET BY MOUTH  DAILY  90 tablet 1  . metoprolol tartrate (LOPRESSOR) 50 MG tablet TAKE 1 TABLET BY MOUTH TWO  TIMES DAILY 180 tablet 1  . oxyCODONE-acetaminophen (PERCOCET/ROXICET) 5-325 MG tablet Take 1-2 tablets by mouth every 6 (six) hours as needed for severe pain. 40 tablet 0  . Polyethyl Glycol-Propyl Glycol (SYSTANE OP) Place 1 drop into both eyes 3 (three) times daily as needed (dry eyes).    Marland Kitchen tiZANidine (ZANAFLEX) 2 MG tablet Take 2 mg by mouth every 6 (six) hours as needed for muscle spasms.    . traMADol (ULTRAM) 50 MG tablet Take 50-100 mg by mouth every 6 (six) hours as needed for moderate pain.   0   No current facility-administered medications for this visit.     Physical Exam:     BP 112/80   Pulse 68   Temp 98.3 F (36.8 C) (Oral)   Ht 5\' 3"  (1.6 m)   Wt 221 lb (100.2 kg)   BMI 39.15 kg/m   GENERAL:  Pleasant female in NAD PSYCH: : Cooperative, normal affect EENT:  conjunctiva pink, mucous membranes moist, neck supple without masses CARDIAC:  RRR,  no peripheral edema PULM: Normal respiratory effort, lungs CTA bilaterally, no wheezing ABDOMEN:  Nondistended, soft, nontender. No obvious masses,  normal bowel sounds SKIN:  turgor, no lesions seen Musculoskeletal:  Normal muscle tone, normal strength NEURO: Alert and oriented x 3, no focal neurologic deficits   Tye Savoy , NP 06/15/2019, 2:51 PM

## 2019-06-15 NOTE — Patient Instructions (Signed)
We have sent the following medications to your pharmacy for you to pick up at your convenience: Nexium 40 mg daily  You will be due for a recall colonoscopy in 2021. We will send you a reminder in the mail when it gets closer to that time.  Call our office if your swallowing gets any worse. Our phone number is [phone].  If you are age 72 or older, your body mass index should be between 23-30. Your Body mass index is 39.15 kg/m. If this is out of the aforementioned range listed, please consider follow up with your Primary Care Provider.  If you are age 76 or younger, your body mass index should be between 19-25. Your Body mass index is 39.15 kg/m. If this is out of the aformentioned range listed, please consider follow up with your Primary Care Provider.

## 2019-06-19 DIAGNOSIS — M1712 Unilateral primary osteoarthritis, left knee: Secondary | ICD-10-CM | POA: Diagnosis not present

## 2019-06-23 DIAGNOSIS — Z96651 Presence of right artificial knee joint: Secondary | ICD-10-CM | POA: Diagnosis not present

## 2019-06-23 DIAGNOSIS — M1711 Unilateral primary osteoarthritis, right knee: Secondary | ICD-10-CM | POA: Diagnosis not present

## 2019-06-27 DIAGNOSIS — M1711 Unilateral primary osteoarthritis, right knee: Secondary | ICD-10-CM | POA: Diagnosis not present

## 2019-06-27 DIAGNOSIS — Z96651 Presence of right artificial knee joint: Secondary | ICD-10-CM | POA: Diagnosis not present

## 2019-06-30 DIAGNOSIS — Z96651 Presence of right artificial knee joint: Secondary | ICD-10-CM | POA: Diagnosis not present

## 2019-06-30 DIAGNOSIS — M1711 Unilateral primary osteoarthritis, right knee: Secondary | ICD-10-CM | POA: Diagnosis not present

## 2019-07-03 ENCOUNTER — Other Ambulatory Visit: Payer: Self-pay | Admitting: Internal Medicine

## 2019-07-03 MED ORDER — DULOXETINE HCL 20 MG PO CPEP: 40.0000 mg | ORAL_CAPSULE | Freq: Every day | ORAL | 0 refills | Status: DC

## 2019-07-03 NOTE — Telephone Encounter (Signed)
Requested medication (s) are due for refill today: yes  Requested medication (s) are on the active medication list: yes  Last refill:  03/2019  Future visit scheduled: yes  Notes to clinic:  Ordering provider and pcp are different   Requested Prescriptions  Pending Prescriptions Disp Refills   DULoxetine (CYMBALTA) 20 MG capsule 180 capsule 0    Sig: Take 2 capsules (40 mg total) by mouth daily.     Psychiatry: Antidepressants - SNRI Failed - 07/03/2019 11:14 AM      Failed - Completed PHQ-2 or PHQ-9 in the last 360 days.      Passed - Last BP in normal range    BP Readings from Last 1 Encounters:  06/15/19 112/80         Passed - Valid encounter within last 6 months    Recent Outpatient Visits          2 months ago Hyperglycemia   [place] John, James W, MD   7 months ago Chronic pain of both [place], [place], DO   9 months ago Chronic pain of both knees   [facility] [place], [place], DO   9 months ago Primary osteoarthritis of both knees   [place], [place], DO   12 months ago Primary osteoarthritis of both [place] Quinter, [place], DO      Future Appointments            In 2 weeks Jenny Reichmann, Hunt Oris, MD [place], [facility]

## 2019-07-03 NOTE — Telephone Encounter (Signed)
Medication Refill - Medication: DULoxetine (CYMBALTA) 20 MG capsule    Has the patient contacted their pharmacy? Yes.   (Agent: If no, request that the patient contact the pharmacy for the refill.) (Agent: If yes, when and what did the pharmacy advise?) Never heard back  Preferred Pharmacy (with phone number or street name): [facility] [place]   Agent: Please be advised that RX refills may take up to 3 business days. We ask that you follow-up with your pharmacy.

## 2019-07-11 ENCOUNTER — Other Ambulatory Visit: Payer: Self-pay | Admitting: Internal Medicine

## 2019-07-11 DIAGNOSIS — H02423 Myogenic ptosis of bilateral eyelids: Secondary | ICD-10-CM | POA: Diagnosis not present

## 2019-07-11 DIAGNOSIS — H2513 Age-related nuclear cataract, bilateral: Secondary | ICD-10-CM | POA: Diagnosis not present

## 2019-07-11 DIAGNOSIS — H0102A Squamous blepharitis right eye, upper and lower eyelids: Secondary | ICD-10-CM | POA: Diagnosis not present

## 2019-07-11 DIAGNOSIS — H0102B Squamous blepharitis left eye, upper and lower eyelids: Secondary | ICD-10-CM | POA: Diagnosis not present

## 2019-07-11 DIAGNOSIS — H1045 Other chronic allergic conjunctivitis: Secondary | ICD-10-CM | POA: Diagnosis not present

## 2019-07-19 NOTE — Progress Notes (Signed)
Addendum: Reviewed and agree with assessment and management plan. Dannon Nguyenthi M, MD  

## 2019-07-20 ENCOUNTER — Encounter: Payer: Self-pay | Admitting: Internal Medicine

## 2019-07-20 ENCOUNTER — Ambulatory Visit (INDEPENDENT_AMBULATORY_CARE_PROVIDER_SITE_OTHER): Payer: Medicare Other | Admitting: Internal Medicine

## 2019-07-20 ENCOUNTER — Other Ambulatory Visit: Payer: Self-pay

## 2019-07-20 ENCOUNTER — Other Ambulatory Visit (INDEPENDENT_AMBULATORY_CARE_PROVIDER_SITE_OTHER): Payer: Medicare Other

## 2019-07-20 ENCOUNTER — Other Ambulatory Visit: Payer: Self-pay | Admitting: Internal Medicine

## 2019-07-20 VITALS — BP 124/86 | HR 85 | Temp 98.0°F | Ht 63.0 in | Wt 223.0 lb

## 2019-07-20 DIAGNOSIS — E559 Vitamin D deficiency, unspecified: Secondary | ICD-10-CM

## 2019-07-20 DIAGNOSIS — K219 Gastro-esophageal reflux disease without esophagitis: Secondary | ICD-10-CM

## 2019-07-20 DIAGNOSIS — R609 Edema, unspecified: Secondary | ICD-10-CM

## 2019-07-20 DIAGNOSIS — E611 Iron deficiency: Secondary | ICD-10-CM

## 2019-07-20 DIAGNOSIS — Z0000 Encounter for general adult medical examination without abnormal findings: Secondary | ICD-10-CM | POA: Diagnosis not present

## 2019-07-20 DIAGNOSIS — E538 Deficiency of other specified B group vitamins: Secondary | ICD-10-CM | POA: Diagnosis not present

## 2019-07-20 DIAGNOSIS — R739 Hyperglycemia, unspecified: Secondary | ICD-10-CM

## 2019-07-20 DIAGNOSIS — I10 Essential (primary) hypertension: Secondary | ICD-10-CM | POA: Diagnosis not present

## 2019-07-20 DIAGNOSIS — Z0001 Encounter for general adult medical examination with abnormal findings: Secondary | ICD-10-CM

## 2019-07-20 DIAGNOSIS — R600 Localized edema: Secondary | ICD-10-CM

## 2019-07-20 LAB — URINALYSIS, ROUTINE W REFLEX MICROSCOPIC
Hgb urine dipstick: NEGATIVE
Nitrite: NEGATIVE
RBC / HPF: NONE SEEN (ref 0–?)
Specific Gravity, Urine: 1.025 (ref 1.000–1.030)
Urine Glucose: NEGATIVE
Urobilinogen, UA: 1 (ref 0.0–1.0)
pH: 5.5 (ref 5.0–8.0)

## 2019-07-20 LAB — BASIC METABOLIC PANEL
BUN: 19 mg/dL (ref 6–23)
CO2: 29 mEq/L (ref 19–32)
Calcium: 9.9 mg/dL (ref 8.4–10.5)
Chloride: 99 mEq/L (ref 96–112)
Creatinine, Ser: 0.82 mg/dL (ref 0.40–1.20)
GFR: 82.81 mL/min (ref >60.00)
Glucose, Bld: 105 mg/dL — ABNORMAL HIGH (ref 70–99)
Potassium: 3.4 mEq/L — ABNORMAL LOW (ref 3.5–5.1)
Sodium: 139 mEq/L (ref 135–145)

## 2019-07-20 LAB — CBC WITH DIFFERENTIAL/PLATELET
Basophils Absolute: 0.1 10*3/uL (ref 0.0–0.1)
Basophils Relative: 0.9 % (ref 0.0–3.0)
Eosinophils Absolute: 0.4 10*3/uL (ref 0.0–0.7)
Eosinophils Relative: 5.1 % — ABNORMAL HIGH (ref 0.0–5.0)
HCT: 34.2 % — ABNORMAL LOW (ref 36.0–46.0)
Hemoglobin: 11.4 g/dL — ABNORMAL LOW (ref 12.0–15.0)
Lymphocytes Relative: 28.4 % (ref 12.0–46.0)
Lymphs Abs: 2.4 10*3/uL (ref 0.7–4.0)
MCHC: 33.3 g/dL (ref 30.0–36.0)
MCV: 84.8 fl (ref 78.0–100.0)
Monocytes Absolute: 0.9 10*3/uL (ref 0.1–1.0)
Monocytes Relative: 11.1 % (ref 3.0–12.0)
Neutro Abs: 4.6 10*3/uL (ref 1.4–7.7)
Neutrophils Relative %: 54.5 % (ref 43.0–77.0)
Platelets: 404 10*3/uL — ABNORMAL HIGH (ref 150.0–400.0)
RBC: 4.03 Mil/uL (ref 3.87–5.11)
RDW: 14.4 % (ref 11.5–15.5)
WBC: 8.4 10*3/uL (ref 4.0–10.5)

## 2019-07-20 LAB — IBC PANEL
Iron: 47 ug/dL (ref 42–145)
Saturation Ratios: 10.9 % — ABNORMAL LOW (ref 20.0–50.0)
Transferrin: 309 mg/dL (ref 212.0–360.0)

## 2019-07-20 LAB — LIPID PANEL
Cholesterol: 164 mg/dL (ref 0–200)
HDL: 61.2 mg/dL (ref >39.00)
LDL Cholesterol: 76 mg/dL (ref 0–99)
NonHDL: 103.06
Total CHOL/HDL Ratio: 3
Triglycerides: 137 mg/dL (ref 0.0–149.0)
VLDL: 27.4 mg/dL (ref 0.0–40.0)

## 2019-07-20 LAB — HEPATIC FUNCTION PANEL
ALT: 12 U/L (ref 0–35)
AST: 12 U/L (ref 0–37)
Albumin: 4 g/dL (ref 3.5–5.2)
Alkaline Phosphatase: 107 U/L (ref 39–117)
Bilirubin, Direct: 0 mg/dL (ref 0.0–0.3)
Total Bilirubin: 0.4 mg/dL (ref 0.2–1.2)
Total Protein: 7 g/dL (ref 6.0–8.3)

## 2019-07-20 LAB — HEMOGLOBIN A1C: Hgb A1c MFr Bld: 6.5 % (ref 4.6–6.5)

## 2019-07-20 LAB — TSH: TSH: 1.73 u[IU]/mL (ref 0.35–4.50)

## 2019-07-20 LAB — BRAIN NATRIURETIC PEPTIDE: Pro B Natriuretic peptide (BNP): 21 pg/mL (ref 0.0–100.0)

## 2019-07-20 LAB — VITAMIN B12: Vitamin B-12: 324 pg/mL (ref 211–911)

## 2019-07-20 LAB — VITAMIN D 25 HYDROXY (VIT D DEFICIENCY, FRACTURES): VITD: 47.34 ng/mL (ref 30.00–100.00)

## 2019-07-20 MED ORDER — POTASSIUM CHLORIDE ER 10 MEQ PO TBCR: 10.0000 meq | EXTENDED_RELEASE_TABLET | Freq: Every day | ORAL | 3 refills | Status: DC

## 2019-07-20 MED ORDER — LOSARTAN POTASSIUM 100 MG PO TABS: 100.0000 mg | ORAL_TABLET | Freq: Every day | ORAL | 3 refills | Status: DC

## 2019-07-20 MED ORDER — FUROSEMIDE 20 MG PO TABS: 20.0000 mg | ORAL_TABLET | Freq: Every day | ORAL | 3 refills | Status: DC

## 2019-07-20 NOTE — Assessment & Plan Note (Signed)
To change the losartan hct to losartan 100 qd, f/u next visit

## 2019-07-20 NOTE — Assessment & Plan Note (Signed)
stable overall by history and exam, recent data reviewed with pt, and pt to continue medical treatment as before,  to f/u any worsening symptoms or concerns  

## 2019-07-20 NOTE — Progress Notes (Signed)
Subjective:    Patient ID: [name], female    DOB: 15-Dec-1945, 73 y.o.   MRN: [id number]  HPI  Here for wellness and f/u;  Overall doing ok;Marland Kitchen  Pt denies neurological change such as new headache, facial or extremity weakness.  Pt denies polydipsia, polyuria, or low sugar symptoms. Pt states overall good compliance with treatment and medications, good tolerability, and has been trying to follow appropriate diet.  Pt denies worsening depressive symptoms, suicidal ideation or panic. No fever, night sweats, wt loss, loss of appetite, or other constitutional symptoms.  Pt states good ability with ADL's, has low fall risk, home safety reviewed and adequate, no other significant changes in hearing or vision, and not active with exercise. No more falls since right knee TKR, due for the left knee TKR next yr. Walks with walker. Does not think she can get up on exam table today  Pt plans to call for her yearly mammogram put off due to the pandemic. Also, pt c/o mild worsening peripheral edema over the last 2 wks which does not go down at night, and Pt denies chest pain, increased sob or doe, wheezing, orthopnea, PND, palpitations, dizziness or syncope.   Pt denies fever, wt loss, night sweats, loss of appetite, or other constitutional symptoms  Denies worsening reflux, abd pain, dysphagia, n/v, bowel change or blood. Past Medical History:  Diagnosis Date  . Allergic rhinitis, cause unspecified   . Allergy    SEASONAL  . Anemia   . Cataract    Bilateral  . Chronic headaches   . Constipation   . Diverticulosis   . DJD (degenerative joint disease), lumbar   . GERD (gastroesophageal reflux disease)   . Hemorrhoids   . Hiatal hernia   . HTN (hypertension)   . Hyperlipidemia   . Obesity   . Osteoarthritis   . Tubular adenoma of colon    Past Surgical History:  Procedure Laterality Date  . ABLATION    . COLONOSCOPY    . TOTAL KNEE ARTHROPLASTY Right 04/14/2019   Procedure: TOTAL KNEE  ARTHROPLASTY;  Surgeon: Dorna Leitz, MD;  Location: [facility];  Service: Orthopedics;  Laterality: Right;  . TUBAL LIGATION      reports that she has never smoked. She has never used smokeless tobacco. She reports that she does not drink alcohol or use drugs. family history includes Breast cancer in her sister; Diabetes in her brother; Heart disease in her mother; Hypertension in her mother. Allergies  Allergen Reactions  . 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 Sulfonamide Derivatives Itching and Rash   Current Outpatient Medications on File Prior to Visit  Medication Sig Dispense Refill  . aspirin EC 325 MG tablet Take 1 tablet (325 mg total) by mouth 2 (two) times daily after a meal. Take x 1 month post op to decrease risk of blood clots. 60 tablet 0  . atorvastatin (LIPITOR) 20 MG tablet TAKE 1 TABLET BY MOUTH  DAILY 90 tablet 3  . azelastine (ASTELIN) 0.1 % nasal spray Place 1 spray into both nostrils 2 (two) times daily. Use in each nostril as directed    . Calcium Carb-Cholecalciferol (CALCIUM 600/VITAMIN D3 PO) Take 1 tablet by mouth daily.    . cetirizine (ZYRTEC) 10 MG tablet Take 1 tablet (10 mg total) by mouth daily. As needed 90 tablet 3  . diltiazem (TIAZAC) 120 MG 24 hr  capsule TAKE 1 CAPSULE BY MOUTH  DAILY 90 capsule 3  . docusate sodium (COLACE) 100 MG capsule Take 1 capsule (100 mg total) by mouth 2 (two) times daily. 30 capsule 0  . DULoxetine (CYMBALTA) 20 MG capsule Take 2 capsules (40 mg total) by mouth daily. 180 capsule 0  . esomeprazole (NEXIUM) 40 MG capsule Take 1 capsule (40 mg total) by mouth daily. 30 capsule 11  . gabapentin (NEURONTIN) 100 MG capsule Take 2 capsules (200 mg total) by mouth at bedtime. 60 capsule 3  . metoprolol tartrate (LOPRESSOR) 50 MG tablet TAKE 1 TABLET BY MOUTH TWO  TIMES DAILY 180 tablet 1  . oxyCODONE-acetaminophen (PERCOCET/ROXICET) 5-325 MG tablet Take  1-2 tablets by mouth every 6 (six) hours as needed for severe pain. 40 tablet 0  . Polyethyl Glycol-Propyl Glycol (SYSTANE OP) Place 1 drop into both eyes 3 (three) times daily as needed (dry eyes).    Marland Kitchen tiZANidine (ZANAFLEX) 2 MG tablet Take 2 mg by mouth every 6 (six) hours as needed for muscle spasms.    . traMADol (ULTRAM) 50 MG tablet Take 50-100 mg by mouth every 6 (six) hours as needed for moderate pain.   0   No current facility-administered medications on file prior to visit.    Review of Systems Constitutional: Negative for other unusual diaphoresis, sweats, appetite or weight changes HENT: Negative for other worsening hearing loss, ear pain, facial swelling, mouth sores or neck stiffness.   Eyes: Negative for other worsening pain, redness or other visual disturbance.  Respiratory: Negative for other stridor or swelling Cardiovascular: Negative for other palpitations or other chest pain  Gastrointestinal: Negative for worsening diarrhea or loose stools, blood in stool, distention or other pain Genitourinary: Negative for hematuria, flank pain or other change in urine volume.  Musculoskeletal: Negative for myalgias or other joint swelling.  Skin: Negative for other color change, or other wound or worsening drainage.  Neurological: Negative for other syncope or numbness. Hematological: Negative for other adenopathy or swelling Psychiatric/Behavioral: Negative for hallucinations, other worsening agitation, SI, self-injury, or new decreased concentration All otherwise neg per pt    Objective:   Physical Exam BP 124/86   Pulse 85   Temp 98 F (36.7 C) (Oral)   Ht 5\' 3"  (1.6 m)   Wt 223 lb (101.2 kg)   SpO2 97%   BMI 39.50 kg/m  VS noted,  Constitutional: Pt is oriented to person, place, and time. Appears well-developed and well-nourished, in no significant distress and comfortable Head: Normocephalic and atraumatic  Eyes: Conjunctivae and EOM are normal. Pupils are equal,  round, and reactive to light Right Ear: External ear normal without discharge Left Ear: External ear normal without discharge Nose: Nose without discharge or deformity Mouth/Throat: Oropharynx is without other ulcerations and moist  Neck: Normal range of motion. Neck supple. No JVD present. No tracheal deviation present or significant neck LA or mass Cardiovascular: Normal rate, regular rhythm, normal heart sounds and intact distal pulses.   Pulmonary/Chest: WOB normal and breath sounds without rales or wheezing  Abdominal: Soft. Bowel sounds are normal. NT. No HSM  Musculoskeletal: Normal range of motion. Exhibits 2+ bilat edema to knees Lymphadenopathy: Has no other cervical adenopathy.  Neurological: Pt is alert and oriented to person, place, and time. Pt has normal reflexes. No cranial nerve deficit. Motor grossly intact, Gait intact Skin: Skin is warm and dry. No rash noted or new ulcerations Psychiatric:  Has normal mood and affect. Behavior is normal  without agitation All otherwise neg per pt  Lab Results  Component Value Date   WBC 8.4 07/20/2019   HGB 11.4 (L) 07/20/2019   HCT 34.2 (L) 07/20/2019   PLT 404.0 (H) 07/20/2019   GLUCOSE 105 (H) 07/20/2019   CHOL 164 07/20/2019   TRIG 137.0 07/20/2019   HDL 61.20 07/20/2019   LDLDIRECT 168.8 01/20/2012   LDLCALC 76 07/20/2019   ALT 12 07/20/2019   AST 12 07/20/2019   NA 139 07/20/2019   K 3.4 (L) 07/20/2019   CL 99 07/20/2019   CREATININE 0.82 07/20/2019   BUN 19 07/20/2019   CO2 29 07/20/2019   TSH 1.73 07/20/2019   INR 0.9 04/12/2019   HGBA1C 6.5 07/20/2019        Assessment & Plan:

## 2019-07-20 NOTE — Assessment & Plan Note (Signed)
C/w likely venous insufficiency worsening, for change hct 25 to lasix 20 qd, may need higher dose if not improved, for BNP with labs, consider echo, f/u 1 mo  In addition to the time spent performing CPE, I spent an additional 25 minutes face to face,in which greater than 50% of this time was spent in counseling and coordination of care for patient's acute illness as documented, including the differential dx, treatment, further evaluation and other management of peripheral edema, HTN, hyperglycemia, GERD

## 2019-07-20 NOTE — Patient Instructions (Addendum)
Please remember to call about your yearly mammogram  Ok to stop the Losartan - hydrochlorothiazide  Please take all new medication as prescribed - the plain losartan 100 mg per day, AND the new fluid pill lasix (furosemide) at 20 mg per day  Please continue all other medications as before, and refills have been done if requested.  Please have the pharmacy call with any other refills you may need.  Please continue your efforts at being more active, low cholesterol diet, and weight control.  You are otherwise up to date with prevention measures today.  Please keep your appointments with your specialists as you may have planned  Please go to the LAB in the Basement (turn left off the elevator) for the tests to be done today  You will be contacted by phone if any changes need to be made immediately.  Otherwise, you will receive a letter about your results with an explanation, but please check with MyChart first.  Please remember to sign up for MyChart if you have not done so, as this will be important to you in the future with finding out test results, communicating by private email, and scheduling acute appointments online when needed.  Please return in 1 months, or sooner if needed

## 2019-07-20 NOTE — Assessment & Plan Note (Signed)

## 2019-07-21 ENCOUNTER — Telehealth: Payer: Self-pay

## 2019-07-21 DIAGNOSIS — L219 Seborrheic dermatitis, unspecified: Secondary | ICD-10-CM | POA: Diagnosis not present

## 2019-07-21 DIAGNOSIS — L281 Prurigo nodularis: Secondary | ICD-10-CM | POA: Diagnosis not present

## 2019-07-21 NOTE — Telephone Encounter (Signed)
-----   Message from Biagio Borg, MD sent at 07/20/2019 12:40 PM EDT ----- Left message on MyChart, pt to cont same tx except  The test results show that your current treatment is OK, as the tests are stable, except the potassium is mildly low, which is most likely due to the fluid pill  We should start a potassium pill at one per day,  I will send the prescription, and you should hear from the office as well.[name] to please inform pt, I will do rx

## 2019-07-21 NOTE — Telephone Encounter (Signed)
Pt has been informed of results and expressed understanding.  °

## 2019-07-24 DIAGNOSIS — Z79891 Long term (current) use of opiate analgesic: Secondary | ICD-10-CM | POA: Diagnosis not present

## 2019-07-24 DIAGNOSIS — M48062 Spinal stenosis, lumbar region with neurogenic claudication: Secondary | ICD-10-CM | POA: Diagnosis not present

## 2019-07-24 DIAGNOSIS — I10 Essential (primary) hypertension: Secondary | ICD-10-CM | POA: Diagnosis not present

## 2019-07-27 ENCOUNTER — Telehealth: Payer: Self-pay | Admitting: Internal Medicine

## 2019-07-27 MED ORDER — GABAPENTIN 100 MG PO CAPS: 200.0000 mg | ORAL_CAPSULE | Freq: Every day | ORAL | 3 refills | Status: DC

## 2019-07-27 NOTE — Telephone Encounter (Signed)
Ok this is done 

## 2019-07-27 NOTE — Telephone Encounter (Signed)
Copied from [place] [id number]. Topic: Quick Communication - Rx Refill/Question >> Jul 27, 2019 10:12 AM Naoma Stare wrote: Medication gabapentin (NEURONTIN) 100 MG capsule  Preferred Pharmacy [facility] [address]   Agent: Please be advised that RX refills may take up to 3 business days. We ask that you follow-up with your pharmacy.

## 2019-08-01 DIAGNOSIS — M1712 Unilateral primary osteoarthritis, left knee: Secondary | ICD-10-CM | POA: Diagnosis not present

## 2019-08-01 DIAGNOSIS — H0102B Squamous blepharitis left eye, upper and lower eyelids: Secondary | ICD-10-CM | POA: Diagnosis not present

## 2019-08-01 DIAGNOSIS — H2511 Age-related nuclear cataract, right eye: Secondary | ICD-10-CM | POA: Diagnosis not present

## 2019-08-03 DIAGNOSIS — H2512 Age-related nuclear cataract, left eye: Secondary | ICD-10-CM | POA: Diagnosis not present

## 2019-08-22 ENCOUNTER — Encounter: Payer: Self-pay | Admitting: Internal Medicine

## 2019-08-22 ENCOUNTER — Ambulatory Visit (INDEPENDENT_AMBULATORY_CARE_PROVIDER_SITE_OTHER): Payer: Medicare Other | Admitting: Internal Medicine

## 2019-08-22 ENCOUNTER — Other Ambulatory Visit: Payer: Self-pay

## 2019-08-22 VITALS — BP 148/76 | HR 74 | Temp 98.3°F | Wt 220.2 lb

## 2019-08-22 DIAGNOSIS — I10 Essential (primary) hypertension: Secondary | ICD-10-CM

## 2019-08-22 DIAGNOSIS — R739 Hyperglycemia, unspecified: Secondary | ICD-10-CM

## 2019-08-22 DIAGNOSIS — E785 Hyperlipidemia, unspecified: Secondary | ICD-10-CM | POA: Diagnosis not present

## 2019-08-22 DIAGNOSIS — R609 Edema, unspecified: Secondary | ICD-10-CM

## 2019-08-22 MED ORDER — FUROSEMIDE 20 MG PO TABS: ORAL_TABLET | ORAL | 3 refills | Status: DC

## 2019-08-22 MED ORDER — DILTIAZEM HCL ER 240 MG PO CP24: 240.0000 mg | ORAL_CAPSULE | Freq: Every day | ORAL | 3 refills | Status: DC

## 2019-08-22 NOTE — Assessment & Plan Note (Signed)
stable overall by history and exam, recent data reviewed with pt, and pt to continue medical treatment as before,  to f/u any worsening symptoms or concerns  

## 2019-08-22 NOTE — Assessment & Plan Note (Signed)
Uncontrolled, to increase the diltiazem ER to 240 mg per day, f/u 1 mo

## 2019-08-22 NOTE — Patient Instructions (Signed)
Ok to increase the diltiazem ER to 240 mg per day (so it is ok to take 2 of your 120 mg pills until they are used up)  OK to increase the lasix (furosemide) to 20 mg each AM, and then 20 mg in the PM if you have persistent swelling in the legs  Please continue all other medications as before, and refills have been done if requested.  Please have the pharmacy call with any other refills you may need.  Please continue your efforts at being more active, low cholesterol diet, and weight control.  Please keep your appointments with your specialists as you may have planned  Please return in 1 months, or sooner if needed

## 2019-08-22 NOTE — Assessment & Plan Note (Signed)
Ok for increased  Lasix to 20 qam, plus 20 qpm for persistent swelling; will need BMP next visit

## 2019-08-22 NOTE — Progress Notes (Signed)
Subjective:    Patient ID: [name], female    DOB: 1946/03/17, 73 y.o.   MRN: [id number]  HPI  Here to f/u; overall doing ok,  Pt denies chest pain, increasing sob or doe, wheezing, orthopnea, PND, increased LE swelling, palpitations, dizziness or syncope.  Pt denies new neurological symptoms such as new headache, or facial or extremity weakness or numbness.  Pt denies polydipsia, polyuria, or low sugar episode.  Pt states overall good compliance with meds, mostly trying to follow appropriate diet, with wt overall stable,  but little exercise however, walks with 2 wheeled walker.  Overall swelling and wt some improved but still at times gets weepiness from the feet and legs when swelling gets worse some days - none today. Wt Readings from Last 3 Encounters:  08/22/19 220 lb 3.2 oz (99.9 kg)  07/20/19 223 lb (101.2 kg)  06/15/19 221 lb (100.2 kg)  Plans to consider having left knee TKR surgury sometime after xmas.  No new complaints Past Medical History:  Diagnosis Date  . Allergic rhinitis, cause unspecified   . Allergy    SEASONAL  . Anemia   . Cataract    Bilateral  . Chronic headaches   . Constipation   . Diverticulosis   . DJD (degenerative joint disease), lumbar   . GERD (gastroesophageal reflux disease)   . Hemorrhoids   . Hiatal hernia   . HTN (hypertension)   . Hyperlipidemia   . Obesity   . Osteoarthritis   . Tubular adenoma of colon    Past Surgical History:  Procedure Laterality Date  . ABLATION    . COLONOSCOPY    . 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 TUBAL LIGATION      reports that she has never smoked. She has never used smokeless tobacco. She reports that she does not drink alcohol or use drugs. family history includes Breast cancer in her sister; Diabetes in her brother; Heart disease in her mother; Hypertension  in her mother. Allergies  Allergen Reactions  . 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 Sulfonamide Derivatives Itching and Rash   Current Outpatient Medications on File Prior to Visit  Medication Sig Dispense Refill  . aspirin EC 325 MG tablet Take 1 tablet (325 mg total) by mouth 2 (two) times daily after a meal. Take x 1 month post op to decrease risk of blood clots. 60 tablet 0  . atorvastatin (LIPITOR) 20 MG tablet TAKE 1 TABLET BY MOUTH  DAILY 90 tablet 3  . azelastine (ASTELIN) 0.1 % nasal spray Place 1 spray into both nostrils 2 (two) times daily. Use in each nostril as directed    . Calcium Carb-Cholecalciferol (CALCIUM 600/VITAMIN D3 PO) Take 1 tablet by mouth daily.    . cetirizine (ZYRTEC) 10 MG tablet Take 1 tablet (10 mg total) by mouth daily. As needed 90 tablet 3  . docusate sodium (COLACE) 100 MG capsule Take 1 capsule (100 mg total) by mouth 2 (two) times daily. 30 capsule 0  . DULoxetine (CYMBALTA) 20 MG capsule Take 2 capsules (40 mg total) by mouth daily. 180 capsule 0  . esomeprazole (NEXIUM) 40 MG capsule Take 1 capsule (40 mg total) by mouth daily. 30 capsule 11  . gabapentin (NEURONTIN) 100 MG capsule Take  2 capsules (200 mg total) by mouth at bedtime. 60 capsule 3  . losartan (COZAAR) 100 MG tablet Take 1 tablet (100 mg total) by mouth daily. 90 tablet 3  . metoprolol tartrate (LOPRESSOR) 50 MG tablet TAKE 1 TABLET BY MOUTH TWO  TIMES DAILY 180 tablet 1  . oxyCODONE-acetaminophen (PERCOCET/ROXICET) 5-325 MG tablet Take 1-2 tablets by mouth every 6 (six) hours as needed for severe pain. 40 tablet 0  . Polyethyl Glycol-Propyl Glycol (SYSTANE OP) Place 1 drop into both eyes 3 (three) times daily as needed (dry eyes).    . potassium chloride (KLOR-CON) 10 MEQ tablet Take 1 tablet (10 mEq total) by mouth daily. 90 tablet 3  . tiZANidine (ZANAFLEX) 2 MG tablet Take 2 mg by mouth every 6 (six)  hours as needed for muscle spasms.    . traMADol (ULTRAM) 50 MG tablet Take 50-100 mg by mouth every 6 (six) hours as needed for moderate pain.   0   No current facility-administered medications on file prior to visit.    Review of Systems  Constitutional: Negative for other unusual diaphoresis or sweats HENT: Negative for ear discharge or swelling Eyes: Negative for other worsening visual disturbances Respiratory: Negative for stridor or other swelling  Gastrointestinal: Negative for worsening distension or other blood Genitourinary: Negative for retention or other urinary change Musculoskeletal: Negative for other MSK pain or swelling Skin: Negative for color change or other new lesions Neurological: Negative for worsening tremors and other numbness  Psychiatric/Behavioral: Negative for worsening agitation or other fatigue All otherwise neg per pt     Objective:   Physical Exam BP (!) 148/76 (BP Location: Left Arm)   Pulse 74   Temp 98.3 F (36.8 C) (Oral)   Wt 220 lb 3.2 oz (99.9 kg)   SpO2 96%   BMI 39.01 kg/m  VS noted,  Constitutional: Pt appears in NAD HENT: Head: NCAT.  Right Ear: External ear normal.  Left Ear: External ear normal.  Eyes: . Pupils are equal, round, and reactive to light. Conjunctivae and EOM are normal Nose: without d/c or deformity Neck: Neck supple. Gross normal ROM Cardiovascular: Normal rate and regular rhythm.   Pulmonary/Chest: Effort normal and breath sounds without rales or wheezing.  Abd:  Soft, NT, ND, + BS, no organomegaly Neurological: Pt is alert. At baseline orientation, motor grossly intact Skin: Skin is warm. No rashes, other new lesions, trace to 1+ bilat to mid legs LE edema Psychiatric: Pt behavior is normal without agitation  All otherwise neg per pt  Lab Results  Component Value Date   WBC 8.4 07/20/2019   HGB 11.4 (L) 07/20/2019   HCT 34.2 (L) 07/20/2019   PLT 404.0 (H) 07/20/2019   GLUCOSE 105 (H) 07/20/2019   CHOL  164 07/20/2019   TRIG 137.0 07/20/2019   HDL 61.20 07/20/2019   LDLDIRECT 168.8 01/20/2012   LDLCALC 76 07/20/2019   ALT 12 07/20/2019   AST 12 07/20/2019   NA 139 07/20/2019   K 3.4 (L) 07/20/2019   CL 99 07/20/2019   CREATININE 0.82 07/20/2019   BUN 19 07/20/2019   CO2 29 07/20/2019   TSH 1.73 07/20/2019   INR 0.9 04/12/2019   HGBA1C 6.5 07/20/2019         Assessment & Plan:

## 2019-08-24 DIAGNOSIS — M48062 Spinal stenosis, lumbar region with neurogenic claudication: Secondary | ICD-10-CM | POA: Diagnosis not present

## 2019-08-25 ENCOUNTER — Telehealth: Payer: Self-pay | Admitting: Internal Medicine

## 2019-08-25 MED ORDER — DILTIAZEM HCL ER 240 MG PO CP24: 240.0000 mg | ORAL_CAPSULE | Freq: Every day | ORAL | 1 refills | Status: DC

## 2019-08-25 NOTE — Telephone Encounter (Signed)
[facility] called asking further asking about medication for patient.  [phone] Ref # [id number]

## 2019-08-25 NOTE — Telephone Encounter (Signed)
Requested medication (s) are due for refill today: yes  Requested medication (s) are on the active medication list: yes  Last refill:  08/22/2019  Future visit scheduled: yes  Notes to clinic:  Patient states the dose is incorrect. She states that she takes 120mg  instead of 240.   Requested Prescriptions  Pending Prescriptions Disp Refills   diltiazem (DILACOR XR) 240 MG 24 hr capsule 90 capsule 3    Sig: Take 1 capsule (240 mg total) by mouth daily.     Cardiovascular:  Calcium Channel Blockers Failed - 08/25/2019 12:29 PM      Failed - Last BP in normal range    BP Readings from Last 1 Encounters:  08/22/19 (!) 148/76         Passed - Valid encounter within last 6 months    Recent Outpatient Visits          3 days ago Essential hypertension   [place] John, James W, MD   1 month ago Encounter for well adult exam with abnormal findings   [place] John, James W, MD   4 months ago Hyperglycemia   [place] John, James W, MD   9 months ago Chronic pain of both knees   [place], [place], DO   11 months ago Chronic pain of both knees   [employer] [place], [place], DO      Future Appointments            In 4 weeks Jenny Reichmann, Hunt Oris, MD [place], [facility]

## 2019-08-25 NOTE — Telephone Encounter (Signed)
Medication Refill - Medication: diltiazem (DILACOR XR) 240 MG 24 hr capsule   Preferred Pharmacy:  [place], [place] [facility]  [address] [place] [address]  Phone: [phone] Fax: [phone]     Pt was advised that RX refills may take up to 3 business days. We ask that you follow-up with your pharmacy.

## 2019-08-25 NOTE — Telephone Encounter (Signed)
Pharmacist wanted to verify medication strength. It has been verified and med will be filled and sent to patients home.

## 2019-09-22 ENCOUNTER — Ambulatory Visit (INDEPENDENT_AMBULATORY_CARE_PROVIDER_SITE_OTHER): Payer: Medicare Other | Admitting: Internal Medicine

## 2019-09-22 ENCOUNTER — Other Ambulatory Visit: Payer: Self-pay

## 2019-09-22 ENCOUNTER — Encounter: Payer: Self-pay | Admitting: Internal Medicine

## 2019-09-22 DIAGNOSIS — I10 Essential (primary) hypertension: Secondary | ICD-10-CM | POA: Diagnosis not present

## 2019-09-22 DIAGNOSIS — R739 Hyperglycemia, unspecified: Secondary | ICD-10-CM

## 2019-09-22 DIAGNOSIS — G4700 Insomnia, unspecified: Secondary | ICD-10-CM | POA: Diagnosis not present

## 2019-09-22 MED ORDER — TRAZODONE HCL 50 MG PO TABS: 25.0000 mg | ORAL_TABLET | Freq: Every evening | ORAL | 1 refills | Status: DC | PRN

## 2019-09-22 NOTE — Progress Notes (Signed)
Subjective:    Patient ID: [name], female    DOB: [birth year], 73 y.o.   MRN: [id number]  HPI  Here to f/u; overall doing ok,  Pt denies chest pain, increasing sob or doe, wheezing, orthopnea, PND, palpitations, dizziness or syncope, though has some pedal swelling persistent.  Pt denies new neurological symptoms such as new headache, or facial or extremity weakness or numbness.  Pt denies polydipsia, polyuria, or low sugar episode.  Pt states overall good compliance with meds, mostly trying to follow appropriate diet, with wt overall stable Wt Readings from Last 3 Encounters:  09/22/19 222 lb (100.7 kg)  08/22/19 220 lb 3.2 oz (99.9 kg)  07/20/19 223 lb (101.2 kg)  Does have increased sleep difficulty last few weeks with more stress recently. Denies worsening depressive symptoms, suicidal ideation, or panic Past Medical History:  Diagnosis Date  . Allergic rhinitis, cause unspecified   . Allergy    SEASONAL  . Anemia   . Cataract    Bilateral  . Chronic headaches   . Constipation   . Diverticulosis   . DJD (degenerative joint disease), lumbar   . GERD (gastroesophageal reflux disease)   . Hemorrhoids   . Hiatal hernia   . HTN (hypertension)   . Hyperlipidemia   . Obesity   . Osteoarthritis   . Tubular adenoma of colon    Past Surgical History:  Procedure Laterality Date  . ABLATION    . COLONOSCOPY    . 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 TUBAL LIGATION      reports that she has never smoked. She has never used smokeless tobacco. She reports that she does not drink alcohol or use drugs. family history includes Breast cancer in her sister; Diabetes in her brother; Heart disease in her mother; Hypertension in her mother. Allergies  Allergen Reactions  . Soybean-Containing Drug Products Other (See Comments)    Allergy testing  positive for soy beans but pt uses soy sauce  . Sulfonamide Derivatives Itching and Rash   Current Outpatient Medications on File Prior to Visit  Medication Sig Dispense Refill  . aspirin EC 325 MG tablet Take 1 tablet (325 mg total) by mouth 2 (two) times daily after a meal. Take x 1 month post op to decrease risk of blood clots. 60 tablet 0  . atorvastatin (LIPITOR) 20 MG tablet TAKE 1 TABLET BY MOUTH  DAILY 90 tablet 3  . azelastine (ASTELIN) 0.1 % nasal spray Place 1 spray into both nostrils 2 (two) times daily. Use in each nostril as directed    . Calcium Carb-Cholecalciferol (CALCIUM 600/VITAMIN D3 PO) Take 1 tablet by mouth daily.    . cetirizine (ZYRTEC) 10 MG tablet Take 1 tablet (10 mg total) by mouth daily. As needed 90 tablet 3  . diltiazem (DILACOR XR) 240 MG 24 hr capsule Take 1 capsule (240 mg total) by mouth daily. 90 capsule 1  . docusate sodium (COLACE) 100 MG capsule Take 1 capsule (100 mg total) by mouth 2 (two) times daily. 30 capsule 0  . DULoxetine (CYMBALTA) 20 MG capsule Take 2 capsules (40 mg total) by mouth daily. 180 capsule 0  . esomeprazole (NEXIUM) 40 MG capsule Take 1 capsule (40 mg total) by mouth daily. 30 capsule 11  . furosemide (LASIX) 20 MG tablet 1 tab by mouth each AM, and 1  in PM for persistent swelling 180 tablet 3  . gabapentin (NEURONTIN) 100 MG capsule Take 2 capsules (200 mg total) by mouth at bedtime. 60 capsule 3  . losartan (COZAAR) 100 MG tablet Take 1 tablet (100 mg total) by mouth daily. 90 tablet 3  . metoprolol tartrate (LOPRESSOR) 50 MG tablet TAKE 1 TABLET BY MOUTH TWO  TIMES DAILY 180 tablet 1  . oxyCODONE-acetaminophen (PERCOCET/ROXICET) 5-325 MG tablet Take 1-2 tablets by mouth every 6 (six) hours as needed for severe pain. 40 tablet 0  . Polyethyl Glycol-Propyl Glycol (SYSTANE OP) Place 1 drop into both eyes 3 (three) times daily as needed (dry eyes).    . potassium chloride (KLOR-CON) 10 MEQ tablet Take 1 tablet (10 mEq total) by mouth  daily. 90 tablet 3  . tiZANidine (ZANAFLEX) 2 MG tablet Take 2 mg by mouth every 6 (six) hours as needed for muscle spasms.    . traMADol (ULTRAM) 50 MG tablet Take 50-100 mg by mouth every 6 (six) hours as needed for moderate pain.   0   No current facility-administered medications on file prior to visit.    Review of Systems  Constitutional: Negative for other unusual diaphoresis or sweats HENT: Negative for ear discharge or swelling Eyes: Negative for other worsening visual disturbances Respiratory: Negative for stridor or other swelling  Gastrointestinal: Negative for worsening distension or other blood Genitourinary: Negative for retention or other urinary change Musculoskeletal: Negative for other MSK pain or swelling Skin: Negative for color change or other new lesions Neurological: Negative for worsening tremors and other numbness  Psychiatric/Behavioral: Negative for worsening agitation or other fatigue .alj     Objective:   Physical Exam BP 126/86   Pulse 79   Temp 98.4 F (36.9 C) (Oral)   Ht 5\' 3"  (1.6 m)   Wt 222 lb (100.7 kg)   SpO2 94%   BMI 39.33 kg/m  VS noted,  Constitutional: Pt appears in NAD HENT: Head: NCAT.  Right Ear: External ear normal.  Left Ear: External ear normal.  Eyes: . Pupils are equal, round, and reactive to light. Conjunctivae and EOM are normal Nose: without d/c or deformity Neck: Neck supple. Gross normal ROM Cardiovascular: Normal rate and regular rhythm.   Pulmonary/Chest: Effort normal and breath sounds without rales or wheezing.  Abd:  Soft, NT, ND, + BS, no organomegaly Neurological: Pt is alert. At baseline orientation, motor grossly intact Skin: Skin is warm. No rashes, other new lesions, trace bilateral pedal LE edema Psychiatric: Pt behavior is normal without agitation  All otherwise neg per pt  Lab Results  Component Value Date   WBC 8.4 07/20/2019   HGB 11.4 (L) 07/20/2019   HCT 34.2 (L) 07/20/2019   PLT 404.0 (H)  07/20/2019   GLUCOSE 105 (H) 07/20/2019   CHOL 164 07/20/2019   TRIG 137.0 07/20/2019   HDL 61.20 07/20/2019   LDLDIRECT 168.8 01/20/2012   LDLCALC 76 07/20/2019   ALT 12 07/20/2019   AST 12 07/20/2019   NA 139 07/20/2019   K 3.4 (L) 07/20/2019   CL 99 07/20/2019   CREATININE 0.82 07/20/2019   BUN 19 07/20/2019   CO2 29 07/20/2019   TSH 1.73 07/20/2019   INR 0.9 04/12/2019   HGBA1C 6.5 07/20/2019        Assessment & Plan:

## 2019-09-22 NOTE — Patient Instructions (Signed)
Please take all new medication as prescribed  - the trazodone for sleep  Please continue all other medications as before, and refills have been done if requested.  Please have the pharmacy call with any other refills you may need.  Please continue your efforts at being more active, low cholesterol diet, and weight control.  Please keep your appointments with your specialists as you may have planned  Please return in 3 months, or sooner if needed

## 2019-09-23 ENCOUNTER — Encounter: Payer: Self-pay | Admitting: Internal Medicine

## 2019-09-23 DIAGNOSIS — G4700 Insomnia, unspecified: Secondary | ICD-10-CM | POA: Insufficient documentation

## 2019-09-23 NOTE — Assessment & Plan Note (Signed)
stable overall by history and exam, recent data reviewed with pt, and pt to continue medical treatment as before,  to f/u any worsening symptoms or concerns  

## 2019-09-23 NOTE — Assessment & Plan Note (Signed)
[place] for trazodone qhs prn, and/or melatonin otc prn

## 2019-09-25 DIAGNOSIS — M48062 Spinal stenosis, lumbar region with neurogenic claudication: Secondary | ICD-10-CM | POA: Diagnosis not present

## 2019-10-09 ENCOUNTER — Other Ambulatory Visit: Payer: Self-pay

## 2019-10-09 MED ORDER — ESOMEPRAZOLE MAGNESIUM 40 MG PO CPDR: 40.0000 mg | DELAYED_RELEASE_CAPSULE | Freq: Every day | ORAL | 1 refills | Status: DC

## 2019-10-16 ENCOUNTER — Other Ambulatory Visit: Payer: Self-pay | Admitting: Internal Medicine

## 2019-10-16 MED ORDER — DULOXETINE HCL 20 MG PO CPEP: 40.0000 mg | ORAL_CAPSULE | Freq: Every day | ORAL | 3 refills | Status: DC

## 2019-10-16 NOTE — Telephone Encounter (Signed)
Done erx to [facility]

## 2019-10-16 NOTE — Telephone Encounter (Signed)
Medication Refill - Medication: DULoxetine (CYMBALTA) 20 MG capsule   Has the patient contacted their pharmacy? No. (Agent: If no, request that the patient contact the pharmacy for the refill.) (Agent: If yes, when and what did the pharmacy advise?)  Preferred Pharmacy (with phone number or street name):  [facility] [phone] - [place], [place] [address] Phone:  [phone]  Fax:  [phone]       Agent: Please be advised that RX refills may take up to 3 business days. We ask that you follow-up with your pharmacy.

## 2019-10-16 NOTE — Telephone Encounter (Signed)
Last seen--07/03/19 Last refilled--09/22/19 Rx Cymbalta

## 2019-10-17 NOTE — Telephone Encounter (Signed)
Called patient to inform her that the prescription had been sent to [facility]

## 2019-10-23 DIAGNOSIS — Z96651 Presence of right artificial knee joint: Secondary | ICD-10-CM | POA: Diagnosis not present

## 2019-10-24 DIAGNOSIS — L219 Seborrheic dermatitis, unspecified: Secondary | ICD-10-CM | POA: Diagnosis not present

## 2019-10-24 DIAGNOSIS — Z23 Encounter for immunization: Secondary | ICD-10-CM | POA: Diagnosis not present

## 2019-10-24 DIAGNOSIS — L700 Acne vulgaris: Secondary | ICD-10-CM | POA: Diagnosis not present

## 2019-11-08 ENCOUNTER — Ambulatory Visit: Payer: Medicare Other | Admitting: Internal Medicine

## 2019-11-08 ENCOUNTER — Ambulatory Visit: Payer: Self-pay

## 2019-11-08 NOTE — Telephone Encounter (Signed)
Patient called stating that she has had swelling in both legs below the knee.  She states that she has noticed the swelling for 1-2 weeks.  She states that her legs blistered and drained.  She states tere is some redness to the skin. She denies pain but states they itch. She has no fever. She states that only time she had this type of swelling was after knee surgery in June or July. Care advice read to patient.she verbalized understanding Call transferred to office for appointment.  Reason for Disposition . [1] MODERATE leg swelling (e.g., swelling extends up to knees) AND [2] new onset or worsening  Answer Assessment - Initial Assessment Questions 1. ONSET: "When did the swelling start?" (e.g., minutes, hours, days)     1 -2 weeks 2. LOCATION: "What part of the leg is swollen?"  "Are both legs swollen or just one leg?"   Both legs below knee 3. SEVERITY: "How bad is the swelling?" (e.g., localized; mild, moderate, severe)  - Localized - small area of swelling localized to one leg  - MILD pedal edema - swelling limited to foot and ankle, pitting edema < 1/4 inch (6 mm) deep, rest and elevation eliminate most or all swelling  - MODERATE edema - swelling of lower leg to knee, pitting edema > 1/4 inch (6 mm) deep, rest and elevation only partially reduce swelling  - SEVERE edema - swelling extends above knee, facial or hand swelling present      Blistering and weeping both legs 4. REDNESS: "Does the swelling look red or infected?"    Slightly red 5. PAIN: "Is the swelling painful to touch?" If so, ask: "How painful is it?"   (Scale 1-10; mild, moderate or severe)     No pain just itches 6. FEVER: "Do you have a fever?" If so, ask: "What is it, how was it measured, and when did it start?"      none 7. CAUSE: "What do you think is causing the leg swelling?"    unsure 8. MEDICAL HISTORY: "Do you have a history of heart failure, kidney disease, liver failure, or cancer?"     no 9. RECURRENT  SYMPTOM: "Have you had leg swelling before?" If so, ask: "When was the last time?" "What happened that time?"    Started knee surgery June or july 10. OTHER SYMPTOMS: "Do you have any other symptoms?" (e.g., chest pain, difficulty breathing)       no 11. PREGNANCY: "Is there any chance you are pregnant?" "When was your last menstrual period?"       N/A  Protocols used: LEG SWELLING AND EDEMA-A-AH

## 2019-11-08 NOTE — Telephone Encounter (Signed)
   Patient scheduled for 11/09/19. Declined sooner appointment due to transportation

## 2019-11-09 ENCOUNTER — Encounter: Payer: Self-pay | Admitting: Internal Medicine

## 2019-11-09 ENCOUNTER — Other Ambulatory Visit: Payer: Self-pay

## 2019-11-09 ENCOUNTER — Ambulatory Visit (INDEPENDENT_AMBULATORY_CARE_PROVIDER_SITE_OTHER): Payer: Medicare Other | Admitting: Internal Medicine

## 2019-11-09 VITALS — BP 130/78 | HR 62 | Temp 98.1°F | Ht 63.0 in | Wt 228.4 lb

## 2019-11-09 DIAGNOSIS — S81802A Unspecified open wound, left lower leg, initial encounter: Secondary | ICD-10-CM | POA: Diagnosis not present

## 2019-11-09 DIAGNOSIS — R739 Hyperglycemia, unspecified: Secondary | ICD-10-CM | POA: Diagnosis not present

## 2019-11-09 DIAGNOSIS — R609 Edema, unspecified: Secondary | ICD-10-CM | POA: Diagnosis not present

## 2019-11-09 DIAGNOSIS — I10 Essential (primary) hypertension: Secondary | ICD-10-CM | POA: Diagnosis not present

## 2019-11-09 MED ORDER — FUROSEMIDE 40 MG PO TABS: ORAL_TABLET | ORAL | 3 refills | Status: DC

## 2019-11-09 MED ORDER — DOXYCYCLINE HYCLATE 100 MG PO TABS: 100.0000 mg | ORAL_TABLET | Freq: Two times a day (BID) | ORAL | 0 refills | Status: DC

## 2019-11-09 NOTE — Patient Instructions (Signed)
Please take all new medication as prescribed - the antibiotic  Ok to increase the lasix to 40 mg twice per day  Please continue all other medications as before, and refills have been done if requested.  Please have the pharmacy call with any other refills you may need.  Please continue your efforts at being more active, low cholesterol diet, and weight control  Please keep your appointments with your specialists as you may have planned

## 2019-11-09 NOTE — Progress Notes (Addendum)
Subjective:    Patient ID: [name], female    DOB: 07-Feb-1946, 74 y.o.   MRN: [id number]  HPI   Here with c/o 1 wk worsening bilat LE sweling, just not getting controlled by lasix 20 bid with good compliance; also hit the medial LLE to furniture now with a superficial wound with mild red, tender, but no fever or red streaks or drainage.  Pt denies chest pain, increased sob or doe, wheezing, orthopnea, PND, palpitations, dizziness or syncope.  Pt denies new neurological symptoms such as new headache, or facial or extremity weakness or numbness   Pt denies polydipsia, polyuria   Pt denies fever, wt loss, night sweats, loss of appetite, or other constitutional symptoms  Past Medical History:  Diagnosis Date  . Allergic rhinitis, cause unspecified   . Allergy    SEASONAL  . Anemia   . Cataract    Bilateral  . Chronic headaches   . Constipation   . Diverticulosis   . DJD (degenerative joint disease), lumbar   . GERD (gastroesophageal reflux disease)   . Hemorrhoids   . Hiatal hernia   . HTN (hypertension)   . Hyperlipidemia   . Obesity   . Osteoarthritis   . Tubular adenoma of colon    Past Surgical History:  Procedure Laterality Date  . ABLATION    . COLONOSCOPY    . 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 TUBAL LIGATION      reports that she has never smoked. She has never used smokeless tobacco. She reports that she does not drink alcohol or use drugs. family history includes Breast cancer in her sister; Diabetes in her brother; Heart disease in her mother; Hypertension in her mother. Allergies  Allergen Reactions  . 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 Sulfonamide Derivatives Itching and Rash   Current Outpatient Medications on File Prior to Visit    Medication Sig Dispense Refill  . aspirin EC 325 MG tablet Take 1 tablet (325 mg total) by mouth 2 (two) times daily after a meal. Take x 1 month post op to decrease risk of blood clots. 60 tablet 0  . atorvastatin (LIPITOR) 20 MG tablet TAKE 1 TABLET BY MOUTH  DAILY 90 tablet 3  . azelastine (ASTELIN) 0.1 % nasal spray Place 1 spray into both nostrils 2 (two) times daily. Use in each nostril as directed    . Calcium Carb-Cholecalciferol (CALCIUM 600/VITAMIN D3 PO) Take 1 tablet by mouth daily.    . cetirizine (ZYRTEC) 10 MG tablet Take 1 tablet (10 mg total) by mouth daily. As needed 90 tablet 3  . diltiazem (DILACOR XR) 240 MG 24 hr capsule Take 1 capsule (240 mg total) by mouth daily. 90 capsule 1  . DULoxetine (CYMBALTA) 20 MG capsule Take 2 capsules (40 mg total) by mouth daily. 180 capsule 3  . esomeprazole (NEXIUM) 40 MG capsule Take 1 capsule (40 mg total) by mouth daily. 90 capsule 1  . gabapentin (NEURONTIN) 100 MG capsule Take 2 capsules (200 mg total) by mouth at bedtime. 60 capsule 3  . losartan (COZAAR) 100 MG tablet Take 1 tablet (100 mg total) by mouth daily. 90 tablet 3  . metoprolol tartrate (LOPRESSOR) 50 MG tablet TAKE 1 TABLET BY MOUTH TWO  TIMES  DAILY 180 tablet 1  . Polyethyl Glycol-Propyl Glycol (SYSTANE OP) Place 1 drop into both eyes 3 (three) times daily as needed (dry eyes).    . Polyethylene Glycol 3350 (MIRALAX PO) Take by mouth.    . potassium chloride (KLOR-CON) 10 MEQ tablet Take 1 tablet (10 mEq total) by mouth daily. 90 tablet 3  . tiZANidine (ZANAFLEX) 2 MG tablet Take 2 mg by mouth every 6 (six) hours as needed for muscle spasms.    . traMADol (ULTRAM) 50 MG tablet Take 50-100 mg by mouth every 6 (six) hours as needed for moderate pain.   0  . traZODone (DESYREL) 50 MG tablet Take 0.5-1 tablets (25-50 mg total) by mouth at bedtime as needed for sleep. 90 tablet 1  . oxyCODONE-acetaminophen (PERCOCET/ROXICET) 5-325 MG tablet Take 1-2 tablets by mouth every 6  (six) hours as needed for severe pain. (Patient not taking: Reported on 11/09/2019) 40 tablet 0   No current facility-administered medications on file prior to visit.   Review of Systems All otherwise neg per pt     Objective:   Physical Exam BP 130/78   Pulse 62   Temp 98.1 F (36.7 C)   Ht 5\' 3"  (1.6 m)   Wt 228 lb 6.4 oz (103.6 kg)   SpO2 99%   BMI 40.46 kg/m  VS noted,  Constitutional: Pt appears in NAD HENT: Head: NCAT.  Right Ear: External ear normal.  Left Ear: External ear normal.  Eyes: . Pupils are equal, round, and reactive to light. Conjunctivae and EOM are normal Nose: without d/c or deformity Neck: Neck supple. Gross normal ROM Cardiovascular: Normal rate and regular rhythm.   Pulmonary/Chest: Effort normal and breath sounds without rales or wheezing.  Neurological: Pt is alert. At baseline orientation, motor grossly intact Skin: Skin is warm. No rashes but medial distal LLE with 1 cm area shallow scratch like wound with mild red, tender without red streaks,, 2+ bilat LE edema Psychiatric: Pt behavior is normal without agitation  All otherwise neg per pt  Lab Results  Component Value Date   WBC 8.4 07/20/2019   HGB 11.4 (L) 07/20/2019   HCT 34.2 (L) 07/20/2019   PLT 404.0 (H) 07/20/2019   GLUCOSE 105 (H) 07/20/2019   CHOL 164 07/20/2019   TRIG 137.0 07/20/2019   HDL 61.20 07/20/2019   LDLDIRECT 168.8 01/20/2012   LDLCALC 76 07/20/2019   ALT 12 07/20/2019   AST 12 07/20/2019   NA 139 07/20/2019   K 3.4 (L) 07/20/2019   CL 99 07/20/2019   CREATININE 0.82 07/20/2019   BUN 19 07/20/2019   CO2 29 07/20/2019   TSH 1.73 07/20/2019   INR 0.9 04/12/2019   HGBA1C 6.5 07/20/2019         Assessment & Plan:

## 2019-11-12 ENCOUNTER — Encounter: Payer: Self-pay | Admitting: Internal Medicine

## 2019-11-12 DIAGNOSIS — S81802A Unspecified open wound, left lower leg, initial encounter: Secondary | ICD-10-CM | POA: Insufficient documentation

## 2019-11-12 NOTE — Assessment & Plan Note (Signed)
stable overall by history and exam, recent data reviewed with pt, and pt to continue medical treatment as before,  to f/u any worsening symptoms or concerns  

## 2019-11-12 NOTE — Assessment & Plan Note (Addendum)
With early infection likely, Mild, for antibx course,  to f/u any worsening symptoms or concerns  I spent 33 minutes preparing to see the patient by review of recent labs, imaging and procedures, obtaining and reviewing separately obtained history, communicating with the patient and family or caregiver, ordering medications, tests or procedures, and documenting clinical information in the EHR including the differential Dx, treatment, and any further evaluation and other management of leg wound, hyperglycemia, HTN, peripheral edema

## 2019-11-12 NOTE — Assessment & Plan Note (Signed)
For increased lasix 40 bid, declines lab today, for f/u K and cr next visit

## 2019-11-15 ENCOUNTER — Telehealth: Payer: Self-pay

## 2019-11-15 NOTE — Telephone Encounter (Signed)
Please see below message.

## 2019-11-15 NOTE — Telephone Encounter (Signed)
[place] NP visited home for screening A1C. She  Called to report A1C is 6.5.

## 2019-11-18 ENCOUNTER — Other Ambulatory Visit: Payer: Self-pay | Admitting: Internal Medicine

## 2019-11-23 DIAGNOSIS — M48062 Spinal stenosis, lumbar region with neurogenic claudication: Secondary | ICD-10-CM | POA: Diagnosis not present

## 2019-11-28 ENCOUNTER — Other Ambulatory Visit: Payer: Self-pay | Admitting: Internal Medicine

## 2019-11-28 DIAGNOSIS — Z1231 Encounter for screening mammogram for malignant neoplasm of breast: Secondary | ICD-10-CM

## 2019-11-29 ENCOUNTER — Other Ambulatory Visit: Payer: Self-pay | Admitting: Internal Medicine

## 2019-12-08 ENCOUNTER — Ambulatory Visit: Payer: Medicare Other | Admitting: Internal Medicine

## 2019-12-08 ENCOUNTER — Encounter: Payer: Self-pay | Admitting: Internal Medicine

## 2019-12-08 ENCOUNTER — Other Ambulatory Visit: Payer: Self-pay

## 2019-12-08 NOTE — Patient Instructions (Signed)
none

## 2019-12-08 NOTE — Progress Notes (Signed)
Patient ID: [name], female   DOB: Sep 24, 1946, 74 y.o.   MRN: [id number]  Phone visit  Unable as call goes directly to VM, pt no answer x 3

## 2019-12-11 ENCOUNTER — Ambulatory Visit (INDEPENDENT_AMBULATORY_CARE_PROVIDER_SITE_OTHER): Payer: Medicare Other | Admitting: Family

## 2019-12-11 ENCOUNTER — Encounter: Payer: Self-pay | Admitting: Family

## 2019-12-11 VITALS — BP 134/82 | HR 63 | Temp 98.0°F | Ht 63.0 in | Wt 228.2 lb

## 2019-12-11 DIAGNOSIS — L03116 Cellulitis of left lower limb: Secondary | ICD-10-CM | POA: Diagnosis not present

## 2019-12-11 DIAGNOSIS — R600 Localized edema: Secondary | ICD-10-CM

## 2019-12-11 DIAGNOSIS — I872 Venous insufficiency (chronic) (peripheral): Secondary | ICD-10-CM

## 2019-12-11 LAB — COMPREHENSIVE METABOLIC PANEL
ALT: 13 U/L (ref 0–35)
AST: 15 U/L (ref 0–37)
Albumin: 4.3 g/dL (ref 3.5–5.2)
Alkaline Phosphatase: 111 U/L (ref 39–117)
BUN: 24 mg/dL — ABNORMAL HIGH (ref 6–23)
CO2: 32 mEq/L (ref 19–32)
Calcium: 10.3 mg/dL (ref 8.4–10.5)
Chloride: 100 mEq/L (ref 96–112)
Creatinine, Ser: 1.07 mg/dL (ref 0.40–1.20)
GFR: 60.85 mL/min (ref >60.00)
Glucose, Bld: 99 mg/dL (ref 70–99)
Potassium: 4.1 mEq/L (ref 3.5–5.1)
Sodium: 140 mEq/L (ref 135–145)
Total Bilirubin: 0.4 mg/dL (ref 0.2–1.2)
Total Protein: 7.3 g/dL (ref 6.0–8.3)

## 2019-12-11 LAB — BRAIN NATRIURETIC PEPTIDE: Pro B Natriuretic peptide (BNP): 26 pg/mL (ref 0.0–100.0)

## 2019-12-11 MED ORDER — MUPIROCIN 2 % EX OINT: 1.0000 "application " | TOPICAL_OINTMENT | Freq: Two times a day (BID) | CUTANEOUS | 0 refills | Status: DC

## 2019-12-11 MED ORDER — AMMONIUM LACTATE 12 % EX LOTN: 1.0000 "application " | TOPICAL_LOTION | CUTANEOUS | 0 refills | Status: DC | PRN

## 2019-12-11 MED ORDER — DOXYCYCLINE HYCLATE 100 MG PO TABS: 100.0000 mg | ORAL_TABLET | Freq: Two times a day (BID) | ORAL | 0 refills | Status: DC

## 2019-12-11 NOTE — Progress Notes (Signed)
[name] is a 74 y.o. female with the following history as recorded in EpicCare:  Patient Active Problem List   Diagnosis Date Noted  . Leg wound, left 11/12/2019  . Insomnia 09/23/2019  . Peripheral edema 07/20/2019  . Primary osteoarthritis of right knee 04/14/2019  . Hyperglycemia 04/13/2018  . Acute bursitis of right shoulder 08/16/2017  . Abnormal CXR 07/26/2017  . Unexplained night sweats 07/26/2017  . Greater trochanteric bursitis of right hip 06/23/2017  . Sweating abnormality 03/09/2017  . Gingivitis 03/09/2017  . Herpes zoster without complication 44/12/4740  . Trapezoid ligament sprain, right, initial encounter 12/11/2016  . Fasciculation 12/11/2016  . Acute bronchitis 11/16/2016  . Degenerative joint disease of left shoulder 10/07/2016  . Degenerative arthritis of knee, bilateral 09/16/2016  . Spinal stenosis, lumbar region, with neurogenic claudication 06/17/2016  . Compression fracture of L1 lumbar vertebra ([place]) 03/31/2016  . Pelvic mass in female 03/31/2016  . Urinary frequency 02/27/2016  . 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 Carpal tunnel syndrome 09/18/2014  . Sprain of ankle, unspecified site 07/04/2014  . Primary localized osteoarthrosis, lower leg 05/21/2014  . Left knee pain 03/15/2014  . Food allergy 10/05/2013  . Eustachian tube dysfunction, left 04/24/2013  . Degenerative arthritis of left knee 04/24/2013  . Hot flashes 04/24/2013  . External otitis of left ear 12/15/2012  . Right lumbar radiculitis 04/25/2012  . Seasonal and perennial allergic rhinitis 03/18/2012  . Hearing loss in right ear 03/18/2012  . Esophageal dysphagia 02/11/2012  . Dysphagia 01/20/2012  . Rash 01/20/2012  . Bilateral knee pain 01/20/2012  . Encounter for well adult exam with abnormal findings 01/14/2012  . GERD (gastroesophageal reflux disease) 01/14/2012  . HTN (hypertension) 01/14/2012  . Hyperlipidemia 01/14/2012  . Obesity 01/14/2012  .  Osteoarthritis 01/14/2012  . DJD (degenerative joint disease), lumbar 01/14/2012  . Palpitations 11/21/2010  . Dyspnea on exertion 11/21/2010    Current Outpatient Medications  Medication Sig Dispense Refill  . aspirin EC 325 MG tablet Take 1 tablet (325 mg total) by mouth 2 (two) times daily after a meal. Take x 1 month post op to decrease risk of blood clots. 60 tablet 0  . atorvastatin (LIPITOR) 20 MG tablet TAKE 1 TABLET BY MOUTH  DAILY 90 tablet 3  . azelastine (ASTELIN) 0.1 % nasal spray Place 1 spray into both nostrils 2 (two) times daily. Use in each nostril as directed    . Calcium Carb-Cholecalciferol (CALCIUM 600/VITAMIN D3 PO) Take 1 tablet by mouth daily.    . cetirizine (ZYRTEC) 10 MG tablet Take 1 tablet (10 mg total) by mouth daily. As needed 90 tablet 3  . diltiazem (DILACOR XR) 240 MG 24 hr capsule Take 1 capsule (240 mg total) by mouth daily. 90 capsule 1  . DULoxetine (CYMBALTA) 20 MG capsule Take 2 capsules (40 mg total) by mouth daily. 180 capsule 3  . esomeprazole (NEXIUM) 40 MG capsule Take 1 capsule (40 mg total) by mouth daily. 90 capsule 1  . furosemide (LASIX) 40 MG tablet 1 tab by mouth in the AM and also 1 in the PM for persistent swelling 180 tablet 3  . gabapentin (NEURONTIN) 100 MG capsule TAKE 2 CAPSULES(200 MG) BY MOUTH AT BEDTIME 60 capsule 3  . losartan (COZAAR) 100 MG tablet Take 1 tablet (100 mg total) by mouth daily. 90 tablet 3  . metoprolol tartrate (LOPRESSOR) 50 MG tablet TAKE 1 TABLET BY MOUTH  TWICE DAILY 180 tablet 2  . oxyCODONE-acetaminophen (PERCOCET/ROXICET)  5-325 MG tablet Take 1-2 tablets by mouth every 6 (six) hours as needed for severe pain. 40 tablet 0  . Polyethyl Glycol-Propyl Glycol (SYSTANE OP) Place 1 drop into both eyes 3 (three) times daily as needed (dry eyes).    . Polyethylene Glycol 3350 (MIRALAX PO) Take by mouth.    . potassium chloride (KLOR-CON) 10 MEQ tablet Take 1 tablet (10 mEq total) by mouth daily. 90 tablet 3  .  tiZANidine (ZANAFLEX) 2 MG tablet Take 2 mg by mouth every 6 (six) hours as needed for muscle spasms.    . traMADol (ULTRAM) 50 MG tablet Take 50-100 mg by mouth every 6 (six) hours as needed for moderate pain.   0  . traZODone (DESYREL) 50 MG tablet Take 0.5-1 tablets (25-50 mg total) by mouth at bedtime as needed for sleep. 90 tablet 1  . triamcinolone ointment (KENALOG) 0.1 % APP EXT AA BID FOR 14 DAYS    . ammonium lactate (AMLACTIN) 12 % lotion Apply 1 application topically as needed for dry skin. 400 g 0  . doxycycline (VIBRA-TABS) 100 MG tablet Take 1 tablet (100 mg total) by mouth 2 (two) times daily. 20 tablet 0  . mupirocin ointment (BACTROBAN) 2 % Apply 1 application topically 2 (two) times daily. Use for infection as directed 15 g 0   No current facility-administered medications for this visit.    Allergies: Soybean-containing drug products and Sulfonamide derivatives  Past Medical History:  Diagnosis Date  . Allergic rhinitis, cause unspecified   . Allergy    SEASONAL  . Anemia   . Cataract    Bilateral  . Chronic headaches   . Constipation   . Diverticulosis   . DJD (degenerative joint disease), lumbar   . GERD (gastroesophageal reflux disease)   . Hemorrhoids   . Hiatal hernia   . HTN (hypertension)   . Hyperlipidemia   . Obesity   . Osteoarthritis   . Tubular adenoma of colon     Past Surgical History:  Procedure Laterality Date  . ABLATION    . COLONOSCOPY    . 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 TUBAL LIGATION      Family History  Problem Relation Age of Onset  . Heart disease Mother   . Hypertension Mother   . Diabetes Brother   . Breast cancer Sister   . Colon cancer Neg Hx   . Esophageal cancer Neg Hx   . Rectal cancer Neg Hx   . Stomach cancer Neg Hx     Social History   Tobacco Use  . Smoking status: Never  Smoker  . Smokeless tobacco: Never Used  Substance Use Topics  . Alcohol use: No    Alcohol/week: 0.0 standard drinks    Subjective:  Patient notes she has been having problems with a "weeping" sore on her left lower extremity x 1 week; was seen with similar symptoms at end of January and did seem to improve after treatment with antibiotics and increased dosage of Lasix; has continued to take Lasix twice a day since OV at the end of January; denies any chest pain or shortness of breath;    Objective:  Vitals:   12/11/19 1032  BP: 134/82  Pulse: 63  Temp: 98 F (36.7 C)  TempSrc: Oral  SpO2: 97%  Weight: 228 lb 3.2 oz (103.5 kg)  Height: 5' 3"  (1.6 m)    General: Well developed, well nourished, in no acute distress  Skin : Warm and dry. Localized area of erythema noted on lower left extremity; no streaking noted;  Head: Normocephalic and atraumatic  Lungs: Respirations unlabored; clear to auscultation bilaterally without wheeze, rales, rhonchi  CVS exam: normal rate and regular rhythm.  Extremities: bilateral pitting edema- 1+, no cyanosis, no clubbing  Vessels: Symmetric bilaterally  Neurologic: Alert and oriented; speech intact; face symmetrical; uses walker  Assessment:  1. Pedal edema   2. Venous stasis dermatitis of both lower extremities   3. Cellulitis of left lower extremity     Plan:  Continue Lasix 40 mg bid for now- may eventually need Torsemide or cardiology consult; will check BNP today; she is scheduled to see her PCP in follow-up in 2 weeks regarding this issue; In the interim, Rx for Doxycycline and Bactroban to treat the infection;  Rx for Lac-Hydrin to help with the dry skin noted over both lower extremities;  This visit occurred during the SARS-CoV-2 public health emergency.  Safety protocols were in place, including screening questions prior to the visit, additional usage of staff PPE, and extensive cleaning of exam room while observing appropriate contact  time as indicated for disinfecting solutions.       No follow-ups on file.  Orders Placed This Encounter  Procedures  . Comp Met (CMET)  . B Nat Peptide  . D-Dimer, Quantitative    Requested Prescriptions   Signed Prescriptions Disp Refills  . doxycycline (VIBRA-TABS) 100 MG tablet 20 tablet 0    Sig: Take 1 tablet (100 mg total) by mouth 2 (two) times daily.  Marland Kitchen ammonium lactate (AMLACTIN) 12 % lotion 400 g 0    Sig: Apply 1 application topically as needed for dry skin.  . mupirocin ointment (BACTROBAN) 2 % 15 g 0    Sig: Apply 1 application topically 2 (two) times daily. Use for infection as directed

## 2019-12-12 ENCOUNTER — Other Ambulatory Visit: Payer: Self-pay | Admitting: Family

## 2019-12-12 ENCOUNTER — Ambulatory Visit: Payer: Medicare Other | Admitting: Internal Medicine

## 2019-12-12 DIAGNOSIS — R600 Localized edema: Secondary | ICD-10-CM

## 2019-12-12 DIAGNOSIS — M79605 Pain in left leg: Secondary | ICD-10-CM

## 2019-12-12 DIAGNOSIS — R7989 Other specified abnormal findings of blood chemistry: Secondary | ICD-10-CM

## 2019-12-12 LAB — D-DIMER, QUANTITATIVE: D-Dimer, Quant: 5.52 mcg/mL FEU — ABNORMAL HIGH (ref <0.50)

## 2019-12-13 ENCOUNTER — Telehealth: Payer: Self-pay | Admitting: Family

## 2019-12-13 NOTE — Telephone Encounter (Signed)
I left message for patient to call back regarding her elevated D-dimer; she needs a chest CT in addition to her venous doppler. I asked her to call and speak to our referral coordinator.

## 2019-12-15 ENCOUNTER — Other Ambulatory Visit: Payer: Self-pay

## 2019-12-15 ENCOUNTER — Ambulatory Visit (HOSPITAL_COMMUNITY)
Admission: RE | Admit: 2019-12-15 | Discharge: 2019-12-15 | Disposition: A | Discharge status: Home / Self Care | Payer: Medicare Other | Source: Ambulatory Visit | Attending: Cardiovascular Disease | Admitting: Cardiovascular Disease

## 2019-12-15 DIAGNOSIS — R7989 Other specified abnormal findings of blood chemistry: Secondary | ICD-10-CM | POA: Insufficient documentation

## 2019-12-15 DIAGNOSIS — R600 Localized edema: Secondary | ICD-10-CM | POA: Insufficient documentation

## 2019-12-18 ENCOUNTER — Encounter (HOSPITAL_COMMUNITY): Payer: Self-pay | Admitting: Emergency Medicine

## 2019-12-18 ENCOUNTER — Emergency Department (HOSPITAL_COMMUNITY)
Admission: EM | Admit: 2019-12-18 | Discharge: 2019-12-18 | Disposition: A | Discharge status: Home / Self Care | Payer: Medicare Other | Attending: Emergency Medicine | Admitting: Emergency Medicine

## 2019-12-18 ENCOUNTER — Other Ambulatory Visit: Payer: Self-pay

## 2019-12-18 ENCOUNTER — Ambulatory Visit (INDEPENDENT_AMBULATORY_CARE_PROVIDER_SITE_OTHER)
Admission: RE | Admit: 2019-12-18 | Discharge: 2019-12-18 | Disposition: A | Discharge status: Home / Self Care | Payer: Medicare Other | Source: Ambulatory Visit | Attending: Family | Admitting: Family

## 2019-12-18 DIAGNOSIS — Z7982 Long term (current) use of aspirin: Secondary | ICD-10-CM | POA: Diagnosis not present

## 2019-12-18 DIAGNOSIS — I2699 Other pulmonary embolism without acute cor pulmonale: Secondary | ICD-10-CM | POA: Insufficient documentation

## 2019-12-18 DIAGNOSIS — M79605 Pain in left leg: Secondary | ICD-10-CM | POA: Diagnosis not present

## 2019-12-18 DIAGNOSIS — I10 Essential (primary) hypertension: Secondary | ICD-10-CM | POA: Insufficient documentation

## 2019-12-18 DIAGNOSIS — Z20822 Contact with and (suspected) exposure to covid-19: Secondary | ICD-10-CM | POA: Diagnosis not present

## 2019-12-18 DIAGNOSIS — Z79899 Other long term (current) drug therapy: Secondary | ICD-10-CM | POA: Insufficient documentation

## 2019-12-18 DIAGNOSIS — R0789 Other chest pain: Secondary | ICD-10-CM | POA: Diagnosis not present

## 2019-12-18 DIAGNOSIS — R0602 Shortness of breath: Secondary | ICD-10-CM | POA: Diagnosis not present

## 2019-12-18 LAB — TROPONIN I (HIGH SENSITIVITY)
Troponin I (High Sensitivity): 5 ng/L (ref <18)
Troponin I (High Sensitivity): 4 ng/L (ref <18)

## 2019-12-18 LAB — BASIC METABOLIC PANEL
Anion gap: 9 (ref 5–15)
BUN: 24 mg/dL — ABNORMAL HIGH (ref 8–23)
CO2: 27 mmol/L (ref 22–32)
Calcium: 9.3 mg/dL (ref 8.9–10.3)
Chloride: 102 mmol/L (ref 98–111)
Creatinine, Ser: 1 mg/dL (ref 0.44–1.00)
GFR calc Af Amer: >60 mL/min (ref >60)
GFR calc non Af Amer: 56 mL/min — ABNORMAL LOW (ref >60)
Glucose, Bld: 105 mg/dL — ABNORMAL HIGH (ref 70–99)
Potassium: 3.2 mmol/L — ABNORMAL LOW (ref 3.5–5.1)
Sodium: 138 mmol/L (ref 135–145)

## 2019-12-18 LAB — CBC
HCT: 35.4 % — ABNORMAL LOW (ref 36.0–46.0)
Hemoglobin: 10.9 g/dL — ABNORMAL LOW (ref 12.0–15.0)
MCH: 27.4 pg (ref 26.0–34.0)
MCHC: 30.8 g/dL (ref 30.0–36.0)
MCV: 88.9 fL (ref 80.0–100.0)
Platelets: 334 10*3/uL (ref 150–400)
RBC: 3.98 MIL/uL (ref 3.87–5.11)
RDW: 13.2 % (ref 11.5–15.5)
WBC: 7.8 10*3/uL (ref 4.0–10.5)
nRBC: 0 % (ref 0.0–0.2)

## 2019-12-18 MED ORDER — RIVAROXABAN (XARELTO) EDUCATION KIT FOR DVT/PE PATIENTS: 1.0000 | PACK | Freq: Once | 0 refills | Status: AC

## 2019-12-18 MED ORDER — RIVAROXABAN 15 MG PO TABS
15.0000 mg | ORAL_TABLET | Freq: Once | ORAL | Status: AC
  Administered 2019-12-18: 15 mg via ORAL
  Filled 2019-12-18: qty 1

## 2019-12-18 MED ORDER — RIVAROXABAN (XARELTO) VTE STARTER PACK (15 & 20 MG): ORAL_TABLET | ORAL | 0 refills | Status: DC

## 2019-12-18 MED ORDER — SODIUM CHLORIDE 0.9% FLUSH: 3.0000 mL | Freq: Once | INTRAVENOUS | Status: DC

## 2019-12-18 MED ORDER — RIVAROXABAN (XARELTO) EDUCATION KIT FOR DVT/PE PATIENTS
PACK | Freq: Once | Status: AC
  Filled 2019-12-18: qty 1

## 2019-12-18 MED ORDER — IOHEXOL 300 MG/ML  SOLN
100.0000 mL | Freq: Once | INTRAMUSCULAR | Status: AC | PRN
  Administered 2019-12-18: 80 mL via INTRAVENOUS

## 2019-12-18 NOTE — ED Notes (Signed)
Pharmacist at bedside to educate on [place]

## 2019-12-18 NOTE — Progress Notes (Signed)
Patient's daughter notified as we were unable to get patient on her phone. Patient's daughter agrees to take her mother to the ER.

## 2019-12-18 NOTE — ED Notes (Addendum)
Pt to ED RM 21 from [place]. Pt states she saw her MD due to intermittent SOB and BLE pain and swelling. Pt states her private MD sent her here after workup found PE and DVT. +edema to BLE, distal pulses palpable. Skin warm and intact. Pt a&ox4. Breathing easy, non-labored. Speaking in full sentences. States she had a knee replacement over the summer

## 2019-12-18 NOTE — ED Provider Notes (Signed)
[facility] Provider Note   CSN: [id number] Arrival date & time: 12/18/19  1508     History Chief Complaint  Patient presents with  . PE    Tony Friscia is a 74 y.o. female presenting to the ED with SOB for several months.  She reports she had right knee replacement in July 2020.  Sometime in the days or weeks after that, she noticed some intermittent tightness in her chest and dyspnea on exertion.  She has no symptoms at rest.  She had an outpatient workup with her PCP with positive DDimer, then had a CTPE and DVT study performed.  Findings:  Acute segmental and subsegmental left lower lobe pulmonary emboli. Acute lobar and segmental pulmonary emboli in the right upper and right lower lobes. No saddle pulmonary emboli.  No DVT visualized on ultrasound.  The patient was advised to come to the ER by her PMD office. Here in the ED she is asymptomatic, feels at her baseline.  She denies lightheadedness or active SOB.  She denies significant cardiopulmonary history.  She denies any blood thinner use.  HPI     Past Medical History:  Diagnosis Date  . Allergic rhinitis, cause unspecified   . Allergy    SEASONAL  . Anemia   . Cataract    Bilateral  . Chronic headaches   . Constipation   . Diverticulosis   . DJD (degenerative joint disease), lumbar   . GERD (gastroesophageal reflux disease)   . Hemorrhoids   . Hiatal hernia   . HTN (hypertension)   . Hyperlipidemia   . Obesity   . Osteoarthritis   . Tubular adenoma of colon     Patient Active Problem List   Diagnosis Date Noted  . Leg wound, left 11/12/2019  . Insomnia 09/23/2019  . Peripheral edema 07/20/2019  . Primary osteoarthritis of right knee 04/14/2019  . Hyperglycemia 04/13/2018  . Acute bursitis of right shoulder 08/16/2017  . Abnormal CXR 07/26/2017  . Unexplained night sweats 07/26/2017  . Greater trochanteric bursitis of right hip 06/23/2017  . Sweating  abnormality 03/09/2017  . Gingivitis 03/09/2017  . Herpes zoster without complication 40/98/1191  . Trapezoid ligament sprain, right, initial encounter 12/11/2016  . Fasciculation 12/11/2016  . Acute bronchitis 11/16/2016  . Degenerative joint disease of left shoulder 10/07/2016  . Degenerative arthritis of knee, bilateral 09/16/2016  . Spinal stenosis, lumbar region, with neurogenic claudication 06/17/2016  . Compression fracture of L1 lumbar vertebra ([place]) 03/31/2016  . Pelvic mass in female 03/31/2016  . Urinary frequency 02/27/2016  . 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 Carpal tunnel syndrome 09/18/2014  . Sprain of ankle, unspecified site 07/04/2014  . Primary localized osteoarthrosis, lower leg 05/21/2014  . Left knee pain 03/15/2014  . Food allergy 10/05/2013  . Eustachian tube dysfunction, left 04/24/2013  . Degenerative arthritis of left knee 04/24/2013  . Hot flashes 04/24/2013  . External otitis of left ear 12/15/2012  . Right lumbar radiculitis 04/25/2012  . Seasonal and perennial allergic rhinitis 03/18/2012  . Hearing loss in right ear 03/18/2012  . Esophageal dysphagia 02/11/2012  . Dysphagia 01/20/2012  . Rash 01/20/2012  . Bilateral knee pain 01/20/2012  . Encounter for well adult exam with abnormal findings 01/14/2012  . GERD (gastroesophageal reflux disease) 01/14/2012  . HTN (hypertension) 01/14/2012  . Hyperlipidemia 01/14/2012  . Obesity 01/14/2012  . Osteoarthritis 01/14/2012  . DJD (degenerative joint disease), lumbar 01/14/2012  . Palpitations 11/21/2010  . Dyspnea  on exertion 11/21/2010    Past Surgical History:  Procedure Laterality Date  . ABLATION    . COLONOSCOPY    . 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 TUBAL LIGATION       OB History   No obstetric history on file.     Family History  Problem  Relation Age of Onset  . Heart disease Mother   . Hypertension Mother   . Diabetes Brother   . Breast cancer Sister   . Colon cancer Neg Hx   . Esophageal cancer Neg Hx   . Rectal cancer Neg Hx   . Stomach cancer Neg Hx     Social History   Tobacco Use  . Smoking status: Never Smoker  . Smokeless tobacco: Never Used  Substance Use Topics  . Alcohol use: No    Alcohol/week: 0.0 standard drinks  . Drug use: No    Home Medications Prior to Admission medications   Medication Sig Start Date End Date Taking? Authorizing Provider  ammonium lactate (AMLACTIN) 12 % lotion Apply 1 application topically as needed for dry skin. 12/11/19   Marrian Salvage, FNP  aspirin EC 325 MG tablet Take 1 tablet (325 mg total) by mouth 2 (two) times daily after a meal. Take x 1 month post op to decrease risk of blood clots. 04/14/19   Gary Fleet, PA-C  atorvastatin (LIPITOR) 20 MG tablet TAKE 1 TABLET BY MOUTH  DAILY 07/11/19   Biagio Borg, MD  azelastine (ASTELIN) 0.1 % nasal spray Place 1 spray into both nostrils 2 (two) times daily. Use in each nostril as directed    [provider]  Calcium Carb-Cholecalciferol (CALCIUM 600/VITAMIN D3 PO) Take 1 tablet by mouth daily.    [provider]  cetirizine (ZYRTEC) 10 MG tablet Take 1 tablet (10 mg total) by mouth daily. As needed 04/24/13   Biagio Borg, MD  diltiazem (DILACOR XR) 240 MG 24 hr capsule Take 1 capsule (240 mg total) by mouth daily. 08/25/19   Biagio Borg, MD  doxycycline (VIBRA-TABS) 100 MG tablet Take 1 tablet (100 mg total) by mouth 2 (two) times daily. 12/11/19   Marrian Salvage, FNP  DULoxetine (CYMBALTA) 20 MG capsule Take 2 capsules (40 mg total) by mouth daily. 10/16/19   Biagio Borg, MD  esomeprazole (NEXIUM) 40 MG capsule Take 1 capsule (40 mg total) by mouth daily. 10/09/19   Willia Craze, NP  furosemide (LASIX) 40 MG tablet 1 tab by mouth in the AM and also 1 in the PM for persistent swelling  11/09/19   Biagio Borg, MD  gabapentin (NEURONTIN) 100 MG capsule TAKE 2 CAPSULES(200 MG) BY MOUTH AT BEDTIME 11/18/19   Biagio Borg, MD  losartan (COZAAR) 100 MG tablet Take 1 tablet (100 mg total) by mouth daily. 07/20/19   Biagio Borg, MD  metoprolol tartrate (LOPRESSOR) 50 MG tablet TAKE 1 TABLET BY MOUTH  TWICE DAILY 11/29/19   Biagio Borg, MD  mupirocin ointment (BACTROBAN) 2 % Apply 1 application topically 2 (two) times daily. Use for infection as directed 12/11/19   Marrian Salvage, FNP  oxyCODONE-acetaminophen (PERCOCET/ROXICET) 5-325 MG tablet Take 1-2 tablets by mouth every 6 (six) hours as needed for severe pain. 04/14/19   Gary Fleet, PA-C  Polyethyl Glycol-Propyl Glycol (SYSTANE OP) Place 1 drop into  both eyes 3 (three) times daily as needed (dry eyes).    [provider]  Polyethylene Glycol 3350 (MIRALAX PO) Take by mouth.    [provider]  potassium chloride (KLOR-CON) 10 MEQ tablet Take 1 tablet (10 mEq total) by mouth daily. 07/20/19   Biagio Borg, MD  Rivaroxaban 15 & 20 MG TBPK Follow package directions: Take one 38m tablet by mouth twice a day. On day 22, switch to one 276mtablet once a day. Take with food. 12/18/19   Hunter, JoMartiniqueMD  tiZANidine (ZANAFLEX) 2 MG tablet Take 2 mg by mouth every 6 (six) hours as needed for muscle spasms.    [provider]  traMADol (ULTRAM) 50 MG tablet Take 50-100 mg by mouth every 6 (six) hours as needed for moderate pain.  12/15/17   [provider]  traZODone (DESYREL) 50 MG tablet Take 0.5-1 tablets (25-50 mg total) by mouth at bedtime as needed for sleep. 09/22/19   JoBiagio BorgMD  triamcinolone ointment (KENALOG) 0.1 % APP EXT AA BID FOR 14 DAYS 07/26/19   [provider]    Allergies    Soybean-containing drug products and Sulfonamide derivatives  Review of Systems   Review of Systems  Constitutional: Negative for chills and fever.  Respiratory: Positive for shortness of  breath. Negative for cough.   Cardiovascular: Negative for chest pain and palpitations.  Gastrointestinal: Negative for abdominal pain and vomiting.  Skin: Negative for color change and rash.  Neurological: Negative for syncope and light-headedness.  All other systems reviewed and are negative.   Physical Exam Updated Vital Signs BP (!) 143/90   Pulse 79   Temp 98.3 F (36.8 C) (Oral)   Resp 18   Ht 5' 3"  (1.6 m)   Wt 100.7 kg   SpO2 100%   BMI 39.33 kg/m   Physical Exam Vitals and nursing note reviewed.  Constitutional:      General: She is not in acute distress.    Appearance: She is well-developed.  HENT:     Head: Normocephalic and atraumatic.  Eyes:     Conjunctiva/sclera: Conjunctivae normal.  Cardiovascular:     Rate and Rhythm: Normal rate and regular rhythm.     Pulses: Normal pulses.     Heart sounds: No murmur.  Pulmonary:     Effort: Pulmonary effort is normal. No respiratory distress.     Breath sounds: Normal breath sounds.  Musculoskeletal:     Cervical back: Neck supple.  Skin:    General: Skin is warm and dry.  Neurological:     General: No focal deficit present.     Mental Status: She is alert and oriented to person, place, and time.     ED Results / Procedures / Treatments   Labs (all labs ordered are listed, but only abnormal results are displayed) Labs Reviewed  BASIC METABOLIC PANEL - Abnormal; Notable for the following components:      Result Value   Potassium 3.2 (*)    Glucose, Bld 105 (*)    BUN 24 (*)    GFR calc non Af Amer 56 (*)    All other components within normal limits  CBC - Abnormal; Notable for the following components:   Hemoglobin 10.9 (*)    HCT 35.4 (*)    All other components within normal limits  TROPONIN I (HIGH SENSITIVITY)  TROPONIN I (HIGH SENSITIVITY)    EKG EKG Interpretation  Date/Time:  Monday December 18 2019 15:30:15 EST Ventricular Rate:  65 PR Interval:  158 QRS Duration: 96 QT  Interval:  418 QTC Calculation: 434 R Axis:   31 Text Interpretation: Normal sinus rhythm Normal ECG When compared to prior no significant changes seen. No STEMI Confirmed by Antony Blackbird [phone]) on 12/18/2019 6:25:06 PM   Radiology CT ANGIO CHEST PE W OR WO CONTRAST  Result Date: 12/18/2019 CLINICAL DATA:  Left lower extremity swelling. Elevated D-dimer. Dyspnea. Chest pain. EXAM: CT ANGIOGRAPHY CHEST WITH CONTRAST TECHNIQUE: Multidetector CT imaging of the chest was performed using the standard protocol during bolus administration of intravenous contrast. Multiplanar CT image reconstructions and MIPs were obtained to evaluate the vascular anatomy. CONTRAST:  67m OMNIPAQUE IOHEXOL 300 MG/ML  SOLN COMPARISON:  04/12/2019 chest radiograph. FINDINGS: Cardiovascular: The study is high quality for the evaluation of pulmonary embolism. Acute segmental and subsegmental left lower lobe pulmonary emboli. Acute lobar and segmental pulmonary emboli in the right upper and right lower lobes. No saddle pulmonary emboli. Atherosclerotic nonaneurysmal thoracic aorta. Top-normal caliber main pulmonary artery (3.4 cm diameter). Top-normal heart size. No significant pericardial fluid/thickening. Left anterior descending coronary atherosclerosis. Mediastinum/Nodes: No discrete thyroid nodules. Unremarkable esophagus. No pathologically enlarged axillary, mediastinal or hilar lymph nodes. Lungs/Pleura: No pneumothorax. No pleural effusion. No acute consolidative airspace disease, lung masses or significant pulmonary nodules. Upper abdomen: No acute abnormality. Musculoskeletal: No aggressive appearing focal osseous lesions. Moderate thoracic spondylosis. Review of the MIP images confirms the above findings. IMPRESSION: 1. Acute bilateral pulmonary emboli, extending to the lobar level. No saddle pulmonary emboli. Top-normal caliber main pulmonary artery. 2. No active pulmonary disease. 3. One vessel coronary atherosclerosis. 4.  Aortic Atherosclerosis (ICD10-I70.0). Critical Value/emergent results were called by telephone at the time of interpretation on 12/18/2019 at 1:53 pm to BMeridian Services Corpanswering for [place] who verbally acknowledged these results. Electronically Signed   By: JIlona SorrelM.D.   On: 12/18/2019 14:11    Procedures Procedures (including critical care time)  Medications Ordered in ED Medications  sodium chloride flush (NS) 0.9 % injection 3 mL (has no administration in time range)  rivaroxaban ([employer] Education Kit for DVT/PE patients ( Does not apply Given 12/18/19 2320)  Rivaroxaban (XARELTO) tablet 15 mg (15 mg Oral Given 12/18/19 2319)    ED Course  I have reviewed the triage vital signs and the nursing notes.  Pertinent labs & imaging results that were available during my care of the patient were reviewed by me and considered in my medical decision making (see chart for details).  74yo pleasant female presenting here with multiple small PE discovered on routine outpatient CT PE imaging.  She's had symptoms for months.  I suspect she had a provoked PE in the setting of her right knee replacement.  Her outpatient DVT ultrasound was negative.    Her vital signs look great, and she is 100% on room air, and in no respiratory distress.  Her troponin is 4.  She has an sPESI score of 0.  Per current ACEP recommendations, a patient with low sPESI score can be appropriate for discharge after initiation of A/C in the ER, with outpatient follow up. I think she is an ideal candidate for outpatient management on Xarelto.  We'll educate her about this, start her on it tonight, and have her f/u with her PMD.  Clinical Course as of Dec 19 43  Mon Dec 18, 2019  2206 Daughter updated   [MT]    Clinical Course User  Index [MT] Iana Buzan, Carola Rhine, MD    Final Clinical Impression(s) / ED Diagnoses Final diagnoses:  PE (pulmonary thromboembolism) [facility])    Rx / DC Orders ED Discharge Orders          Ordered    Rivaroxaban 15 & 20 MG TBPK  Status:  Discontinued     12/18/19 2157    rivaroxaban (XARELTO) KIT   Once     12/18/19 2236    Rivaroxaban 15 & 20 MG TBPK     12/18/19 2236           Wyvonnia Dusky, MD 12/19/19 [phone]

## 2019-12-18 NOTE — ED Triage Notes (Signed)
Has been having left leg swelling with pain and had a [place] of leg as well and angio CT and was + for bilateral PE and - for dvt

## 2019-12-18 NOTE — ED Notes (Signed)
Patient verbalizes understanding of discharge instructions, prescription medications, and follow up care. Opportunity for questioning and answers were provided.  All questions answered completely.  Armband removed by staff, pt discharged from ED. Wheeled from ED with all belongings and picked up by daughter

## 2019-12-19 ENCOUNTER — Telehealth: Payer: Self-pay

## 2019-12-19 NOTE — Telephone Encounter (Signed)
Patient returning a call to [place]. Please advise.

## 2019-12-19 NOTE — Telephone Encounter (Signed)
Called patient LVM for call back to office

## 2019-12-19 NOTE — Telephone Encounter (Signed)
New message  The patient was told to call the office this morning, went to the emergency room yesterday due to a blood clot.   Asking for the nurse to call her back.

## 2019-12-19 NOTE — Telephone Encounter (Signed)
Called patient again LVM for call back to office, but left message letting patient know we can see ED notes

## 2019-12-22 ENCOUNTER — Other Ambulatory Visit: Payer: Self-pay

## 2019-12-22 ENCOUNTER — Ambulatory Visit (INDEPENDENT_AMBULATORY_CARE_PROVIDER_SITE_OTHER): Payer: Medicare Other | Admitting: Internal Medicine

## 2019-12-22 DIAGNOSIS — I2699 Other pulmonary embolism without acute cor pulmonale: Secondary | ICD-10-CM | POA: Diagnosis not present

## 2019-12-22 DIAGNOSIS — Z96651 Presence of right artificial knee joint: Secondary | ICD-10-CM

## 2019-12-22 DIAGNOSIS — Z7901 Long term (current) use of anticoagulants: Secondary | ICD-10-CM | POA: Diagnosis not present

## 2019-12-22 DIAGNOSIS — J302 Other seasonal allergic rhinitis: Secondary | ICD-10-CM

## 2019-12-22 MED ORDER — AZELASTINE HCL 0.1 % NA SOLN: 1.0000 | Freq: Two times a day (BID) | NASAL | 12 refills | Status: DC

## 2019-12-22 NOTE — Progress Notes (Signed)
HPI female never smoker followed for allergic rhinitis, recurrent sinusitis, food allergy Allergy Profile 09/18/2013-total IgE 48. Broadly positive for common allergens including aspergillus.  Food profile positive for peanut, soybean. She reports banana and melons make her throat itch.  -------------------------------------------------------------------------.   12/22/2018- 74 year old female never smoker followed for Allergic rhinitis, recurrent sinusitis, food allergy, GERD Has  Been getting steroid inj for OA knees, now referred to Ortho.  -----congestion, nasal drip, pain in throat and ear, denies SOB & chest tightness; had sweats Did well  for most of the winter.  In the last 2 weeks has had left ear ache, left temporal headache, scant nasal discharge.  No fever or sore throat.  Hearing okay.  Chest okay.  3/5/ 21- Virtual Visit via Telephone Note  I connected with [name] on 12/22/19 at  9:00 AM EST by telephone and verified that I am speaking with the correct person using two identifiers.  Location: Patient: H Provider: O   I discussed the limitations, risks, security and privacy concerns of performing an evaluation and management service by telephone and the availability of in person appointments. I also discussed with the patient that there may be a patient responsible charge related to this service. The patient expressed understanding and agreed to proceed.   History of Present Illness: 74 year old female never smoker followed for Allergic rhinitis, recurrent sinusitis, food allergy, GERD, Acute Bilateral lung PE 3/21/ Xarelto,  Zyrtec, Astelin nasal,  Had R TKR last July. Acute bilateral PE without DVT, dx'd 12/18/19 and now on Xarelto f/u PCP.  Residual SOB without new acute events, bleeding, chest or calf pain. Dr Jenny Reichmann managing Xarelto. Had 2 Covax. More than usual nasal congestion as pollen season begins. Not wheezing.  Observations/Objective: ED visit 12/18/19-   Acute R lung PE with hx TKR R in July, 2020. No DVT. Had had intermittent chest tightness and DOE. Outpatient w/u with Ddimer, CTa and leg dopplers, then referred to ER.  Low sPESI score. [place] home on Xarelto   CTa chest 12/18/19- IMPRESSION: 1. Acute bilateral pulmonary emboli, extending to the lobar level. No saddle pulmonary emboli. Top-normal caliber main pulmonary artery. 2. No active pulmonary disease. 3. One vessel coronary atherosclerosis. 4. Aortic Atherosclerosis (ICD10-I70.0).  Assessment and Plan: Allergic Rhinitis- early seasonal exacerb> flonase, antihistamine, azelastine nasal PE- Appropriately on Xarelto, managed by Dr Jenny Reichmann  Follow Up Instructions: 1 year   I discussed the assessment and treatment plan with the patient. The patient was provided an opportunity to ask questions and all were answered. The patient agreed with the plan and demonstrated an understanding of the instructions.   The patient was advised to call back or seek an in-person evaluation if the symptoms worsen or if the condition fails to improve as anticipated.  I provided 17 minutes of non-face-to-face time during this encounter.   Baird Lyons, MD    ROS-see HPI   + = positive Constitutional:   No-   weight loss, night sweats, fevers, chills, fatigue, lassitude. HEENT:   + headaches, no-difficulty swallowing, tooth/dental problems, sore throat,       No-  sneezing, itching, +ear ache, +nasal congestion, post nasal drip,  CV:  No-   chest pain, orthopnea, PND, swelling in lower extremities, anasarca, dizziness, palpitations Resp: No-   shortness of breath with exertion or at rest.              No-productive cough, +non-productive cough,  No- coughing up of blood.  No-   change in color of mucus.  No- wheezing.   Skin: No-   rash or lesions. GI:  No-   heartburn, indigestion, abdominal pain, nausea, vomiting,  GU: . MS:  No-   joint pain or swelling.   Neuro-     nothing  unusual Psych:  No- change in mood or affect. No depression or anxiety.  No memory loss.  OBJ- Physical Exam General- Alert, Oriented, Affect-appropriate, Distress- none acute. + Obese Skin- rash-none, lesions- none, excoriation- none Lymphadenopathy- none Head- atraumatic            Eyes- Gross vision intact, PERRLA, conjunctivae and secretions clear            Ears- Hearing, canals-normal, TMs are clear            Nose- clear, no-Septal dev, mucus, polyps, erosion, perforation, looks healthy            Throat- Mallampati III , mucosa clear , drainage- none, tonsils- atrophic, + missing teeth Neck- flexible , trachea midline, no stridor , thyroid nl, carotid no bruit Chest - symmetrical excursion , unlabored           Heart/CV- RRR , no murmur , no gallop  , no rub, nl s1 s2                           - JVD- none , edema- none, stasis changes- none, varices- none           Lung- clear to P&A, wheeze- none, cough- none, dullness-none, rub- none           Chest wall-  Abd- Br/ Gen/ Rectal- Not done, not indicated Extrem- cyanosis- none, clubbing, none, atrophy- none, strength- nl Neuro- grossly intact to observation

## 2019-12-22 NOTE — Patient Instructions (Addendum)
I'm sorry you had the blood clots in your lung, but really glad it was found so you can be treated.  Spring pollen allergy season is starting. I've sent a refill for your azelastine nasal spray.  We discussed trying Flonase (fluticasone) nasal spray again if needed. Remember this takes a few days to kick in. Try leaving it on you bathroom sink and putting 2 puffs in each nostril every night at bedtime. See if this helps with the stuffiness. You can use Zyrtec, azelastine and flonase together if needed.   Please call if we can help

## 2019-12-26 ENCOUNTER — Ambulatory Visit: Payer: Medicare Other | Admitting: Internal Medicine

## 2019-12-28 ENCOUNTER — Encounter: Payer: Medicare Other | Admitting: Internal Medicine

## 2019-12-28 ENCOUNTER — Ambulatory Visit (INDEPENDENT_AMBULATORY_CARE_PROVIDER_SITE_OTHER): Payer: Medicare Other | Admitting: Internal Medicine

## 2019-12-28 ENCOUNTER — Encounter: Payer: Self-pay | Admitting: Internal Medicine

## 2019-12-28 ENCOUNTER — Other Ambulatory Visit: Payer: Self-pay

## 2019-12-28 DIAGNOSIS — R739 Hyperglycemia, unspecified: Secondary | ICD-10-CM

## 2019-12-28 DIAGNOSIS — I2699 Other pulmonary embolism without acute cor pulmonale: Secondary | ICD-10-CM | POA: Diagnosis not present

## 2019-12-28 DIAGNOSIS — I10 Essential (primary) hypertension: Secondary | ICD-10-CM | POA: Diagnosis not present

## 2019-12-28 MED ORDER — RIVAROXABAN 20 MG PO TABS: 20.0000 mg | ORAL_TABLET | Freq: Every day | ORAL | 4 refills | Status: DC

## 2019-12-28 NOTE — Assessment & Plan Note (Addendum)
Stable, cont same tx, total 6 mo.  I spent 30 minutes in preparing to see the patient by review of recent labs, imaging and procedures, obtaining and reviewing separately obtained history, communicating with the patient and family or caregiver, ordering medications, tests or procedures, and documenting clinical information in the EHR including the differential Dx, treatment, and any further evaluation and other management of acute PE, hyperglycemia, HTN

## 2019-12-28 NOTE — Assessment & Plan Note (Signed)
stable overall by history and exam, recent data reviewed with pt, and pt to continue medical treatment as before,  to f/u any worsening symptoms or concerns  

## 2019-12-28 NOTE — Patient Instructions (Signed)
Ok to finish your starter pack, then continue the Xarelto at 20 mg per day for a total overall of 6 months treatment  You should be able to have the cataract surgury after the 6 month treatment  Please continue all other medications as before, and refills have been done if requested.  Please have the pharmacy call with any other refills you may need.  Please continue your efforts at being more active, low cholesterol diet, and weight control.  Please keep your appointments with your specialists as you may have planned - orthopedic next month for the right knee

## 2019-12-28 NOTE — Progress Notes (Signed)
Subjective:    Patient ID: [name], female    DOB: 10/12/1946, 74 y.o.   MRN: [id number]  HPI  Here after ED visit 3/1 with exam c/w PE; now Pt denies chest pain, increased sob or doe, wheezing, orthopnea, PND, increased LE swelling, palpitations, dizziness or syncope. Pt denies new neurological symptoms such as new headache, or facial or extremity weakness or numbness   Pt denies polydipsia, polyuria.  Tolerating the xarleto well, no overt bleeding. Past Medical History:  Diagnosis Date  . Allergic rhinitis, cause unspecified   . Allergy    SEASONAL  . Anemia   . Cataract    Bilateral  . Chronic headaches   . Constipation   . Diverticulosis   . DJD (degenerative joint disease), lumbar   . GERD (gastroesophageal reflux disease)   . Hemorrhoids   . Hiatal hernia   . HTN (hypertension)   . Hyperlipidemia   . Obesity   . Osteoarthritis   . Tubular adenoma of colon    Past Surgical History:  Procedure Laterality Date  . ABLATION    . COLONOSCOPY    . 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 TUBAL LIGATION      reports that she has never smoked. She has never used smokeless tobacco. She reports that she does not drink alcohol or use drugs. family history includes Breast cancer in her sister; Diabetes in her brother; Heart disease in her mother; Hypertension in her mother. Allergies  Allergen Reactions  . 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 Sulfonamide Derivatives Itching and Rash   Current Outpatient Medications on File Prior to Visit  Medication Sig Dispense Refill  . ammonium lactate (AMLACTIN) 12 % lotion Apply 1 application topically as needed for dry skin. 400 g 0  . aspirin EC 325 MG tablet Take 1 tablet (325 mg total) by mouth 2 (two) times  daily after a meal. Take x 1 month post op to decrease risk of blood clots. 60 tablet 0  . atorvastatin (LIPITOR) 20 MG tablet TAKE 1 TABLET BY MOUTH  DAILY 90 tablet 3  . azelastine (ASTELIN) 0.1 % nasal spray Place 1 spray into both nostrils 2 (two) times daily. Use in each nostril as directed 30 mL 12  . Calcium Carb-Cholecalciferol (CALCIUM 600/VITAMIN D3 PO) Take 1 tablet by mouth daily.    . cetirizine (ZYRTEC) 10 MG tablet Take 1 tablet (10 mg total) by mouth daily. As needed 90 tablet 3  . diltiazem (DILACOR XR) 240 MG 24 hr capsule Take 1 capsule (240 mg total) by mouth daily. 90 capsule 1  . DULoxetine (CYMBALTA) 20 MG capsule Take 2 capsules (40 mg total) by mouth daily. 180 capsule 3  . esomeprazole (NEXIUM) 40 MG capsule Take 1 capsule (40 mg total) by mouth daily. 90 capsule 1  . FLUZONE HIGH-DOSE QUADRIVALENT 0.7 ML SUSY     . furosemide (LASIX) 40 MG tablet 1 tab by mouth in the AM and also 1 in the PM for persistent swelling 180 tablet 3  . gabapentin (NEURONTIN) 100 MG capsule TAKE 2 CAPSULES(200 MG) BY MOUTH AT BEDTIME 60 capsule 3  . losartan (COZAAR) 100 MG tablet Take 1 tablet (100 mg total) by mouth daily. 90 tablet 3  . metoprolol tartrate (  LOPRESSOR) 50 MG tablet TAKE 1 TABLET BY MOUTH  TWICE DAILY 180 tablet 2  . mupirocin ointment (BACTROBAN) 2 % Apply 1 application topically 2 (two) times daily. Use for infection as directed 15 g 0  . Polyethyl Glycol-Propyl Glycol (SYSTANE OP) Place 1 drop into both eyes 3 (three) times daily as needed (dry eyes).    . Polyethylene Glycol 3350 (MIRALAX PO) Take by mouth.    . potassium chloride (KLOR-CON) 10 MEQ tablet Take 1 tablet (10 mEq total) by mouth daily. 90 tablet 3  . Rivaroxaban 15 & 20 MG TBPK Follow package directions: Take one 15mg  tablet by mouth twice a day. On day 22, switch to one 20mg  tablet once a day. Take with food. 51 each 0  . tiZANidine (ZANAFLEX) 2 MG tablet Take 2 mg by mouth every 6 (six) hours as needed for  muscle spasms.    . traMADol (ULTRAM) 50 MG tablet Take 50-100 mg by mouth every 6 (six) hours as needed for moderate pain.   0  . traZODone (DESYREL) 50 MG tablet Take 0.5-1 tablets (25-50 mg total) by mouth at bedtime as needed for sleep. 90 tablet 1   No current facility-administered medications on file prior to visit.   Review of Systems All otherwise neg per pt     Objective:   Physical Exam BP 122/70   Pulse 70   Temp 97.9 F (36.6 C)   Ht 5\' 3"  (1.6 m)   Wt 225 lb (102.1 kg)   SpO2 99%   BMI 39.86 kg/m  VS noted,  Constitutional: Pt appears in NAD HENT: Head: NCAT.  Right Ear: External ear normal.  Left Ear: External ear normal.  Eyes: . Pupils are equal, round, and reactive to light. Conjunctivae and EOM are normal Nose: without d/c or deformity Neck: Neck supple. Gross normal ROM Cardiovascular: Normal rate and regular rhythm.   Pulmonary/Chest: Effort normal and breath sounds without rales or wheezing.  Abd:  Soft, NT, ND, + BS, no organomegaly Neurological: Pt is alert. At baseline orientation, motor grossly intact Skin: Skin is warm. No rashes, other new lesions, no LE edema Psychiatric: Pt behavior is normal without agitation  All otherwise neg per pt  Lab Results  Component Value Date   WBC 7.8 12/18/2019   HGB 10.9 (L) 12/18/2019   HCT 35.4 (L) 12/18/2019   PLT 334 12/18/2019   GLUCOSE 105 (H) 12/18/2019   CHOL 164 07/20/2019   TRIG 137.0 07/20/2019   HDL 61.20 07/20/2019   LDLDIRECT 168.8 01/20/2012   LDLCALC 76 07/20/2019   ALT 13 12/11/2019   AST 15 12/11/2019   NA 138 12/18/2019   K 3.2 (L) 12/18/2019   CL 102 12/18/2019   CREATININE 1.00 12/18/2019   BUN 24 (H) 12/18/2019   CO2 27 12/18/2019   TSH 1.73 07/20/2019   INR 0.9 04/12/2019   HGBA1C 6.5 07/20/2019      Assessment & Plan:

## 2020-01-03 ENCOUNTER — Encounter: Payer: Self-pay | Admitting: Internal Medicine

## 2020-01-03 NOTE — Assessment & Plan Note (Signed)
Early spring pollen exacerb. Plan- flonase, azelastine nasal spray, antihistamine- meds discussed

## 2020-01-03 NOTE — Assessment & Plan Note (Signed)
May be considered "provoked" as hx suggests she may have had several episodes since  TKR July, 2020.  Managed by her PCP. Avoid stasis. 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

## 2020-01-04 ENCOUNTER — Telehealth: Payer: Self-pay

## 2020-01-04 ENCOUNTER — Other Ambulatory Visit: Payer: Self-pay | Admitting: Internal Medicine

## 2020-01-04 MED ORDER — MUPIROCIN 2 % EX OINT: 1.0000 "application " | TOPICAL_OINTMENT | Freq: Two times a day (BID) | CUTANEOUS | 0 refills | Status: DC

## 2020-01-04 NOTE — Telephone Encounter (Signed)
Please advise 

## 2020-01-04 NOTE — Telephone Encounter (Signed)
Dr. Jenny Reichmann please see message below patient is still experiencing problems

## 2020-01-04 NOTE — Telephone Encounter (Signed)
Done erx 

## 2020-01-04 NOTE — Telephone Encounter (Signed)
Patient's daughter [name] calling and states that she is with the patient currently and she is still having 2+ pitting edema in her lower extremities. States that they are also weeping. Please advise.  CB#: [phone]

## 2020-01-04 NOTE — Telephone Encounter (Signed)
New message:   1.Medication Requested: mupirocin ointment (BACTROBAN) 2 % 2. Pharmacy (Name, Street, [place]): [facility] [phone] - [place], [place] - [address] AT [address]. On Med List: Yes  4. Last Visit with PCP: 12/28/19  5. Next visit date with [facility]   Pt states that she is still having leaking near her foot. Please advise.  Agent: Please be advised that RX refills may take up to 3 business days. We ask that you follow-up with your pharmacy.

## 2020-01-05 ENCOUNTER — Ambulatory Visit: Payer: Medicare Other

## 2020-01-05 NOTE — Telephone Encounter (Signed)
Daughter notified and OV made for next Thursday

## 2020-01-05 NOTE — Telephone Encounter (Signed)
The swelling did not seem quite that bad at last visit recently  I think should be ok to continue the lasix at 40 mg twice per day  Keep legs elevated whenever sitting, and low salt diet  Please check daily wts, and we should see her back in 1 wk, especially if it seems the weight is increasing

## 2020-01-08 ENCOUNTER — Ambulatory Visit: Payer: Medicare Other

## 2020-01-09 ENCOUNTER — Ambulatory Visit
Admission: RE | Admit: 2020-01-09 | Discharge: 2020-01-09 | Disposition: A | Discharge status: Home / Self Care | Payer: Medicare Other | Source: Ambulatory Visit | Attending: Internal Medicine | Admitting: Internal Medicine

## 2020-01-09 ENCOUNTER — Other Ambulatory Visit: Payer: Self-pay

## 2020-01-09 DIAGNOSIS — Z1231 Encounter for screening mammogram for malignant neoplasm of breast: Secondary | ICD-10-CM

## 2020-01-11 ENCOUNTER — Encounter: Payer: Self-pay | Admitting: Internal Medicine

## 2020-01-11 ENCOUNTER — Other Ambulatory Visit: Payer: Self-pay

## 2020-01-11 ENCOUNTER — Ambulatory Visit (INDEPENDENT_AMBULATORY_CARE_PROVIDER_SITE_OTHER): Payer: Medicare Other | Admitting: Internal Medicine

## 2020-01-11 VITALS — BP 116/66 | HR 68 | Temp 97.6°F | Ht 63.0 in | Wt 223.8 lb

## 2020-01-11 DIAGNOSIS — L03116 Cellulitis of left lower limb: Secondary | ICD-10-CM | POA: Insufficient documentation

## 2020-01-11 DIAGNOSIS — I10 Essential (primary) hypertension: Secondary | ICD-10-CM

## 2020-01-11 DIAGNOSIS — R739 Hyperglycemia, unspecified: Secondary | ICD-10-CM | POA: Diagnosis not present

## 2020-01-11 DIAGNOSIS — R609 Edema, unspecified: Secondary | ICD-10-CM | POA: Diagnosis not present

## 2020-01-11 MED ORDER — FUROSEMIDE 80 MG PO TABS: 80.0000 mg | ORAL_TABLET | Freq: Two times a day (BID) | ORAL | 3 refills | Status: DC

## 2020-01-11 MED ORDER — DOXYCYCLINE HYCLATE 100 MG PO TABS: 100.0000 mg | ORAL_TABLET | Freq: Two times a day (BID) | ORAL | 0 refills | Status: DC

## 2020-01-11 NOTE — Progress Notes (Signed)
Subjective:    Patient ID: [name], female    DOB: [birth year], 74 y.o.   MRN: [id number]  HPI  Here to f/u with 1 wk worsening bilateral LE swelling and itching discomfort and redness around and area of watery blistering to left lateral distal leg above the ankle for a few days, without fever, chills.  All despite taking the lasix 40 bid Wt Readings from Last 3 Encounters:  01/11/20 223 lb 12.8 oz (101.5 kg)  12/28/19 225 lb (102.1 kg)  12/18/19 222 lb (100.7 kg)  Pt denies chest pain, increased sob or doe, wheezing, orthopnea, PND, palpitations, dizziness or syncope.  Pt denies new neurological symptoms such as new headache, or facial or extremity weakness or numbness   Pt denies polydipsia, polyuria Past Medical History:  Diagnosis Date  . Allergic rhinitis, cause unspecified   . Allergy    SEASONAL  . Anemia   . Cataract    Bilateral  . Chronic headaches   . Constipation   . Diverticulosis   . DJD (degenerative joint disease), lumbar   . GERD (gastroesophageal reflux disease)   . Hemorrhoids   . Hiatal hernia   . HTN (hypertension)   . Hyperlipidemia   . Obesity   . Osteoarthritis   . Tubular adenoma of colon    Past Surgical History:  Procedure Laterality Date  . ABLATION    . COLONOSCOPY    . 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 TUBAL LIGATION      reports that she has never smoked. She has never used smokeless tobacco. She reports that she does not drink alcohol or use drugs. family history includes Breast cancer in her sister; Diabetes in her brother; Heart disease in her mother; Hypertension in her mother. Allergies  Allergen Reactions  . 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 Sulfonamide Derivatives Itching and Rash   Current  Outpatient Medications on File Prior to Visit  Medication Sig Dispense Refill  . ammonium lactate (AMLACTIN) 12 % lotion Apply 1 application topically as needed for dry skin. 400 g 0  . aspirin EC 325 MG tablet Take 1 tablet (325 mg total) by mouth 2 (two) times daily after a meal. Take x 1 month post op to decrease risk of blood clots. 60 tablet 0  . atorvastatin (LIPITOR) 20 MG tablet TAKE 1 TABLET BY MOUTH  DAILY 90 tablet 3  . azelastine (ASTELIN) 0.1 % nasal spray Place 1 spray into both nostrils 2 (two) times daily. Use in each nostril as directed 30 mL 12  . Calcium Carb-Cholecalciferol (CALCIUM 600/VITAMIN D3 PO) Take 1 tablet by mouth daily.    . cetirizine (ZYRTEC) 10 MG tablet Take 1 tablet (10 mg total) by mouth daily. As needed 90 tablet 3  . diltiazem (DILACOR XR) 240 MG 24 hr capsule Take 1 capsule (240 mg total) by mouth daily. 90 capsule 1  . DULoxetine (CYMBALTA) 20 MG capsule Take 2 capsules (40 mg total) by mouth daily. 180 capsule 3  . esomeprazole (NEXIUM) 40 MG capsule Take 1 capsule (40 mg total) by mouth daily. 90 capsule 1  . FLUZONE HIGH-DOSE QUADRIVALENT 0.7 ML SUSY     . gabapentin (NEURONTIN) 100 MG capsule TAKE 2 CAPSULES(200 MG) BY MOUTH AT BEDTIME 60  capsule 3  . losartan (COZAAR) 100 MG tablet Take 1 tablet (100 mg total) by mouth daily. 90 tablet 3  . metoprolol tartrate (LOPRESSOR) 50 MG tablet TAKE 1 TABLET BY MOUTH  TWICE DAILY 180 tablet 2  . mupirocin ointment (BACTROBAN) 2 % Apply 1 application topically 2 (two) times daily. Use for infection as directed 15 g 0  . Polyethyl Glycol-Propyl Glycol (SYSTANE OP) Place 1 drop into both eyes 3 (three) times daily as needed (dry eyes).    . Polyethylene Glycol 3350 (MIRALAX PO) Take by mouth.    . potassium chloride (KLOR-CON) 10 MEQ tablet Take 1 tablet (10 mEq total) by mouth daily. 90 tablet 3  . rivaroxaban (XARELTO) 20 MG TABS tablet Take 1 tablet (20 mg total) by mouth daily with supper. To start after  starter pack about January 18 2020 30 tablet 4  . Rivaroxaban 15 & 20 MG TBPK Follow package directions: Take one 15mg  tablet by mouth twice a day. On day 22, switch to one 20mg  tablet once a day. Take with food. 51 each 0  . tiZANidine (ZANAFLEX) 2 MG tablet Take 2 mg by mouth every 6 (six) hours as needed for muscle spasms.    . traMADol (ULTRAM) 50 MG tablet Take 50-100 mg by mouth every 6 (six) hours as needed for moderate pain.   0  . traZODone (DESYREL) 50 MG tablet Take 0.5-1 tablets (25-50 mg total) by mouth at bedtime as needed for sleep. 90 tablet 1   No current facility-administered medications on file prior to visit.   Review of Systems All otherwise neg per pt     Objective:   Physical Exam BP 116/66   Pulse 68   Temp 97.6 F (36.4 C)   Ht 5\' 3"  (1.6 m)   Wt 223 lb 12.8 oz (101.5 kg)   SpO2 99%   BMI 39.64 kg/m  VS noted,  Constitutional: Pt appears in NAD HENT: Head: NCAT.  Right Ear: External ear normal.  Left Ear: External ear normal.  Eyes: . Pupils are equal, round, and reactive to light. Conjunctivae and EOM are normal Nose: without d/c or deformity Neck: Neck supple. Gross normal ROM Cardiovascular: Normal rate and regular rhythm.   Pulmonary/Chest: Effort normal and breath sounds without rales or wheezing.  Abd:  Soft, NT, ND, + BS, no organomegaly Neurological: Pt is alert. At baseline orientation, motor grossly intact Skin: distal le's with left > right 1-2+ edema, and blistering weepy LLE above the ankle with a 2 cm area red, tender without ulcer or drainage Psychiatric: Pt behavior is normal without agitation  All otherwise neg per pt Lab Results  Component Value Date   WBC 7.8 12/18/2019   HGB 10.9 (L) 12/18/2019   HCT 35.4 (L) 12/18/2019   PLT 334 12/18/2019   GLUCOSE 105 (H) 12/18/2019   CHOL 164 07/20/2019   TRIG 137.0 07/20/2019   HDL 61.20 07/20/2019   LDLDIRECT 168.8 01/20/2012   LDLCALC 76 07/20/2019   ALT 13 12/11/2019   AST 15  12/11/2019   NA 138 12/18/2019   K 3.2 (L) 12/18/2019   CL 102 12/18/2019   CREATININE 1.00 12/18/2019   BUN 24 (H) 12/18/2019   CO2 27 12/18/2019   TSH 1.73 07/20/2019   INR 0.9 04/12/2019   HGBA1C 6.5 07/20/2019         Assessment & Plan:

## 2020-01-11 NOTE — Assessment & Plan Note (Addendum)
Mild to mod, for antibx course,  to f/u any worsening symptoms or concerns  I spent 31 minutes in preparing to see the patient by review of recent labs, imaging and procedures, obtaining and reviewing separately obtained history, communicating with the patient and family or caregiver, ordering medications, tests or procedures, and documenting clinical information in the EHR including the differential Dx, treatment, and any further evaluation and other management of leg cellulitis, peripheral edema, HTN, hyperglycemia

## 2020-01-11 NOTE — Assessment & Plan Note (Signed)
For increased lasix 80 bid, f/u 2 wks, check daily wts and lab next visit

## 2020-01-11 NOTE — Patient Instructions (Signed)
Please take all new medication as prescribed - [facility]  OK to increase the lasix to 80 mg twice per day (sent [facility])  Please continue all other medications as before, and refills have been done if requested.  Please have the pharmacy call with any other refills you may need.  Please keep your appointments with your specialists as you may have planned  You should get a call tomorrow for a return visit in 2 weeks

## 2020-01-11 NOTE — Assessment & Plan Note (Signed)
stable overall by history and exam, recent data reviewed with pt, and pt to continue medical treatment as before,  to f/u any worsening symptoms or concerns  

## 2020-01-12 ENCOUNTER — Telehealth: Payer: Self-pay

## 2020-01-12 NOTE — Telephone Encounter (Signed)
LVM to call back an schedule 2 week follow up.

## 2020-01-12 NOTE — Telephone Encounter (Signed)
-----   Message from [place] sent at 01/12/2020  7:37 AM EDT ----- Regarding: FW: rov 2 wks  ----- Message ----- From: Biagio Borg, MD Sent: 01/11/2020   5:08 PM EDT To: Isidoro Donning Morphies Subject: rov 2 wks                                      Please to call pt - for ROV 2 wks - f/u leg swelling

## 2020-01-25 ENCOUNTER — Ambulatory Visit: Payer: Medicare Other | Admitting: Internal Medicine

## 2020-01-29 DIAGNOSIS — L03116 Cellulitis of left lower limb: Secondary | ICD-10-CM | POA: Diagnosis not present

## 2020-01-29 DIAGNOSIS — M1712 Unilateral primary osteoarthritis, left knee: Secondary | ICD-10-CM | POA: Diagnosis not present

## 2020-01-29 DIAGNOSIS — Z96651 Presence of right artificial knee joint: Secondary | ICD-10-CM | POA: Diagnosis not present

## 2020-01-30 ENCOUNTER — Other Ambulatory Visit: Payer: Self-pay | Admitting: Internal Medicine

## 2020-01-30 MED ORDER — AZELASTINE HCL 0.1 % NA SOLN: 1.0000 | Freq: Two times a day (BID) | NASAL | 4 refills | Status: DC

## 2020-02-01 DIAGNOSIS — M48062 Spinal stenosis, lumbar region with neurogenic claudication: Secondary | ICD-10-CM | POA: Diagnosis not present

## 2020-02-02 ENCOUNTER — Other Ambulatory Visit: Payer: Self-pay | Admitting: Nurse Practitioner

## 2020-02-06 ENCOUNTER — Other Ambulatory Visit: Payer: Self-pay

## 2020-02-06 ENCOUNTER — Other Ambulatory Visit: Payer: Self-pay | Admitting: Internal Medicine

## 2020-02-06 ENCOUNTER — Ambulatory Visit (INDEPENDENT_AMBULATORY_CARE_PROVIDER_SITE_OTHER): Payer: Medicare Other | Admitting: Internal Medicine

## 2020-02-06 ENCOUNTER — Encounter: Payer: Self-pay | Admitting: Internal Medicine

## 2020-02-06 VITALS — BP 140/80 | HR 72 | Temp 98.4°F | Ht 63.0 in | Wt 221.6 lb

## 2020-02-06 DIAGNOSIS — Z0001 Encounter for general adult medical examination with abnormal findings: Secondary | ICD-10-CM

## 2020-02-06 DIAGNOSIS — I872 Venous insufficiency (chronic) (peripheral): Secondary | ICD-10-CM | POA: Diagnosis not present

## 2020-02-06 DIAGNOSIS — I10 Essential (primary) hypertension: Secondary | ICD-10-CM | POA: Diagnosis not present

## 2020-02-06 DIAGNOSIS — R609 Edema, unspecified: Secondary | ICD-10-CM | POA: Diagnosis not present

## 2020-02-06 DIAGNOSIS — R739 Hyperglycemia, unspecified: Secondary | ICD-10-CM | POA: Diagnosis not present

## 2020-02-06 DIAGNOSIS — Z0000 Encounter for general adult medical examination without abnormal findings: Secondary | ICD-10-CM | POA: Diagnosis not present

## 2020-02-06 DIAGNOSIS — I2699 Other pulmonary embolism without acute cor pulmonale: Secondary | ICD-10-CM

## 2020-02-06 LAB — CBC WITH DIFFERENTIAL/PLATELET
Basophils Absolute: 0.1 10*3/uL (ref 0.0–0.1)
Basophils Relative: 0.4 % (ref 0.0–3.0)
Eosinophils Absolute: 0.3 10*3/uL (ref 0.0–0.7)
Eosinophils Relative: 2.3 % (ref 0.0–5.0)
HCT: 31.5 % — ABNORMAL LOW (ref 36.0–46.0)
Hemoglobin: 10 g/dL — ABNORMAL LOW (ref 12.0–15.0)
Lymphocytes Relative: 17.3 % (ref 12.0–46.0)
Lymphs Abs: 2.4 10*3/uL (ref 0.7–4.0)
MCHC: 31.8 g/dL (ref 30.0–36.0)
MCV: 85.1 fl (ref 78.0–100.0)
Monocytes Absolute: 1.2 10*3/uL — ABNORMAL HIGH (ref 0.1–1.0)
Monocytes Relative: 8.5 % (ref 3.0–12.0)
Neutro Abs: 9.9 10*3/uL — ABNORMAL HIGH (ref 1.4–7.7)
Neutrophils Relative %: 71.5 % (ref 43.0–77.0)
Platelets: 459 10*3/uL — ABNORMAL HIGH (ref 150.0–400.0)
RBC: 3.7 Mil/uL — ABNORMAL LOW (ref 3.87–5.11)
RDW: 15.1 % (ref 11.5–15.5)
WBC: 13.8 10*3/uL — ABNORMAL HIGH (ref 4.0–10.5)

## 2020-02-06 LAB — BASIC METABOLIC PANEL
BUN: 26 mg/dL — ABNORMAL HIGH (ref 6–23)
CO2: 30 mEq/L (ref 19–32)
Calcium: 9.3 mg/dL (ref 8.4–10.5)
Chloride: 100 mEq/L (ref 96–112)
Creatinine, Ser: 0.95 mg/dL (ref 0.40–1.20)
GFR: 69.77 mL/min (ref >60.00)
Glucose, Bld: 93 mg/dL (ref 70–99)
Potassium: 3.7 mEq/L (ref 3.5–5.1)
Sodium: 138 mEq/L (ref 135–145)

## 2020-02-06 LAB — URINALYSIS, ROUTINE W REFLEX MICROSCOPIC
Bilirubin Urine: NEGATIVE
Hgb urine dipstick: NEGATIVE
Ketones, ur: NEGATIVE
Leukocytes,Ua: NEGATIVE
Nitrite: NEGATIVE
RBC / HPF: NONE SEEN (ref 0–?)
Specific Gravity, Urine: 1.02 (ref 1.000–1.030)
Total Protein, Urine: NEGATIVE
Urine Glucose: NEGATIVE
Urobilinogen, UA: 0.2 (ref 0.0–1.0)
pH: 5.5 (ref 5.0–8.0)

## 2020-02-06 LAB — LIPID PANEL
Cholesterol: 211 mg/dL — ABNORMAL HIGH (ref 0–200)
HDL: 66.4 mg/dL (ref >39.00)
LDL Cholesterol: 124 mg/dL — ABNORMAL HIGH (ref 0–99)
NonHDL: 145.08
Total CHOL/HDL Ratio: 3
Triglycerides: 104 mg/dL (ref 0.0–149.0)
VLDL: 20.8 mg/dL (ref 0.0–40.0)

## 2020-02-06 LAB — HEPATIC FUNCTION PANEL
ALT: 17 U/L (ref 0–35)
AST: 14 U/L (ref 0–37)
Albumin: 3.8 g/dL (ref 3.5–5.2)
Alkaline Phosphatase: 93 U/L (ref 39–117)
Bilirubin, Direct: 0.1 mg/dL (ref 0.0–0.3)
Total Bilirubin: 0.3 mg/dL (ref 0.2–1.2)
Total Protein: 6.6 g/dL (ref 6.0–8.3)

## 2020-02-06 LAB — TSH: TSH: 0.96 u[IU]/mL (ref 0.35–4.50)

## 2020-02-06 LAB — HEMOGLOBIN A1C: Hgb A1c MFr Bld: 6.4 % (ref 4.6–6.5)

## 2020-02-06 MED ORDER — ATORVASTATIN CALCIUM 40 MG PO TABS: 40.0000 mg | ORAL_TABLET | Freq: Every day | ORAL | 3 refills | Status: DC

## 2020-02-06 NOTE — Assessment & Plan Note (Addendum)
Most likely venous insufficiency post presumed dvt assoc with recent pE, for cont lasix, refre vasc surgury, low salt, compression stockings, leg elevation  I spent 31 minutes in addition to time for CPX wellness examination in preparing to see the patient by review of recent labs, imaging and procedures, obtaining and reviewing separately obtained history, communicating with the patient and family or caregiver, ordering medications, tests or procedures, and documenting clinical information in the EHR including the differential Dx, treatment, and any further evaluation and other management of lle peripheral edema, pE,  Hyperglycemia, htn

## 2020-02-06 NOTE — Assessment & Plan Note (Signed)
For 6 mo anticoagulation planned

## 2020-02-06 NOTE — Progress Notes (Signed)
Subjective:    Patient ID: [name], female    DOB: October 05, 1946, 74 y.o.   MRN: [id number]  HPI  Here for wellness and f/u;  Overall doing ok;  Pt denies Chest pain, worsening SOB, DOE, wheezing, orthopnea, PND, palpitations, dizziness or syncope, but has persistent lle edema swelling post PE.  Pt denies neurological change such as new headache, facial or extremity weakness.  Pt denies polydipsia, polyuria, or low sugar symptoms. Pt states overall good compliance with treatment and medications, good tolerability, and has been trying to follow appropriate diet.  Pt denies worsening depressive symptoms, suicidal ideation or panic. No fever, night sweats, wt loss, loss of appetite, or other constitutional symptoms.  Pt states good ability with ADL's, has low fall risk, home safety reviewed and adequate, no other significant changes in hearing or vision BP Readings from Last 3 Encounters:  02/06/20 140/80  01/11/20 116/66  12/28/19 122/70   Wt Readings from Last 3 Encounters:  02/06/20 221 lb 9.6 oz (100.5 kg)  01/11/20 223 lb 12.8 oz (101.5 kg)  12/28/19 225 lb (102.1 kg)  Wants to hold on colonoscopy while on coumadin until sept 2021.  Past Medical History:  Diagnosis Date  . Allergic rhinitis, cause unspecified   . Allergy    SEASONAL  . Anemia   . Cataract    Bilateral  . Chronic headaches   . Constipation   . Diverticulosis   . DJD (degenerative joint disease), lumbar   . GERD (gastroesophageal reflux disease)   . Hemorrhoids   . Hiatal hernia   . HTN (hypertension)   . Hyperlipidemia   . Obesity   . Osteoarthritis   . Tubular adenoma of colon    Past Surgical History:  Procedure Laterality Date  . ABLATION    . COLONOSCOPY    . 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 TUBAL LIGATION      reports that she has never smoked. She has  never used smokeless tobacco. She reports that she does not drink alcohol or use drugs. family history includes Breast cancer in her sister; Diabetes in her brother; Heart disease in her mother; Hypertension in her mother. Allergies  Allergen Reactions  . 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 Sulfonamide Derivatives Itching and Rash   Current Outpatient Medications on File Prior to Visit  Medication Sig Dispense Refill  . ammonium lactate (AMLACTIN) 12 % lotion Apply 1 application topically as needed for dry skin. 400 g 0  . aspirin EC 325 MG tablet Take 1 tablet (325 mg total) by mouth 2 (two) times daily after a meal. Take x 1 month post op to decrease risk of blood clots. 60 tablet 0  . azelastine (ASTELIN) 0.1 % nasal spray Place 1 spray into both nostrils 2 (two) times daily. Use in each nostril as directed 90 mL 4  . Calcium Carb-Cholecalciferol (CALCIUM 600/VITAMIN D3 PO) Take 1 tablet by mouth daily.    . cetirizine (ZYRTEC) 10 MG tablet Take 1 tablet (10 mg total) by mouth daily. As needed 90 tablet 3  . diltiazem (DILACOR XR) 240 MG 24 hr capsule Take 1 capsule (240 mg total) by mouth daily. 90 capsule 1  . doxycycline (VIBRA-TABS) 100 MG tablet Take 1 tablet (100 mg total)  by mouth 2 (two) times daily. 20 tablet 0  . DULoxetine (CYMBALTA) 20 MG capsule Take 2 capsules (40 mg total) by mouth daily. 180 capsule 3  . FLUZONE HIGH-DOSE QUADRIVALENT 0.7 ML SUSY     . furosemide (LASIX) 80 MG tablet Take 1 tablet (80 mg total) by mouth 2 (two) times daily. 180 tablet 3  . gabapentin (NEURONTIN) 100 MG capsule TAKE 2 CAPSULES(200 MG) BY MOUTH AT BEDTIME 60 capsule 3  . losartan (COZAAR) 100 MG tablet Take 1 tablet (100 mg total) by mouth daily. 90 tablet 3  . metoprolol tartrate (LOPRESSOR) 50 MG tablet TAKE 1 TABLET BY MOUTH  TWICE DAILY 180 tablet 2  . mupirocin ointment (BACTROBAN) 2 % Apply 1 application  topically 2 (two) times daily. Use for infection as directed 15 g 0  . Polyethyl Glycol-Propyl Glycol (SYSTANE OP) Place 1 drop into both eyes 3 (three) times daily as needed (dry eyes).    . Polyethylene Glycol 3350 (MIRALAX PO) Take by mouth.    . potassium chloride (KLOR-CON) 10 MEQ tablet Take 1 tablet (10 mEq total) by mouth daily. 90 tablet 3  . rivaroxaban (XARELTO) 20 MG TABS tablet Take 1 tablet (20 mg total) by mouth daily with supper. To start after starter pack about January 18 2020 30 tablet 4  . Rivaroxaban 15 & 20 MG TBPK Follow package directions: Take one 15mg  tablet by mouth twice a day. On day 22, switch to one 20mg  tablet once a day. Take with food. 51 each 0  . tiZANidine (ZANAFLEX) 2 MG tablet Take 2 mg by mouth every 6 (six) hours as needed for muscle spasms.    . traMADol (ULTRAM) 50 MG tablet Take 50-100 mg by mouth every 6 (six) hours as needed for moderate pain.   0  . traZODone (DESYREL) 50 MG tablet Take 0.5-1 tablets (25-50 mg total) by mouth at bedtime as needed for sleep. 90 tablet 1  . esomeprazole (NEXIUM) 40 MG capsule TAKE 1 CAPSULE BY MOUTH  DAILY 90 capsule 3   No current facility-administered medications on file prior to visit.   Review of Systems All otherwise neg per pt     Objective:   Physical Exam BP 140/80 (BP Location: Left Arm, Patient Position: Sitting, Cuff Size: Large)   Pulse 72   Temp 98.4 F (36.9 C) (Oral)   Ht 5\' 3"  (1.6 m)   Wt 221 lb 9.6 oz (100.5 kg)   SpO2 95%   BMI 39.25 kg/m  VS noted,  Constitutional: Pt appears in NAD HENT: Head: NCAT.  Right Ear: External ear normal.  Left Ear: External ear normal.  Eyes: . Pupils are equal, round, and reactive to light. Conjunctivae and EOM are normal Nose: without d/c or deformity Neck: Neck supple. Gross normal ROM Cardiovascular: Normal rate and regular rhythm.   Pulmonary/Chest: Effort normal and breath sounds without rales or wheezing.  Abd:  Soft, NT, ND, + BS, no  organomegaly Neurological: Pt is alert. At baseline orientation, motor grossly intact Skin: Skin is warm. No rashes, other new lesions Psychiatric: Pt behavior is normal without agitation  LLE with 2-3+ edema and small shallow venous type ulcer mid anterior leg with weepy clear fluid All otherwise neg per pt Lab Results  Component Value Date   WBC 13.8 (H) 02/06/2020   HGB 10.0 (L) 02/06/2020   HCT 31.5 (L) 02/06/2020   PLT 459.0 (H) 02/06/2020   GLUCOSE 93 02/06/2020   CHOL 211 (H)  02/06/2020   TRIG 104.0 02/06/2020   HDL 66.40 02/06/2020   LDLDIRECT 168.8 01/20/2012   LDLCALC 124 (H) 02/06/2020   ALT 17 02/06/2020   AST 14 02/06/2020   NA 138 02/06/2020   K 3.7 02/06/2020   CL 100 02/06/2020   CREATININE 0.95 02/06/2020   BUN 26 (H) 02/06/2020   CO2 30 02/06/2020   TSH 0.96 02/06/2020   INR 0.9 04/12/2019   HGBA1C 6.4 02/06/2020       Assessment & Plan:

## 2020-02-06 NOTE — Assessment & Plan Note (Signed)

## 2020-02-06 NOTE — Assessment & Plan Note (Signed)
stable overall by history and exam, recent data reviewed with pt, and pt to continue medical treatment as before,  to f/u any worsening symptoms or concerns  

## 2020-02-06 NOTE — Patient Instructions (Signed)
Please keep leg elevated when sitting, follow low salt diet, and wear compression stocking to left leg from [facility] on [address].    You will be contacted regarding the referral for: Vascular surgury  We should plan for continued blood thinner through Sept 2021  We can hold on the colonoscopy until Sept 2021 after off the blood thinner  Please continue all other medications as before, including the lasix  Please have the pharmacy call with any other refills you may need.  Please continue your efforts at being more active, low cholesterol diet, and weight control.  You are otherwise up to date with prevention measures today.  Please keep your appointments with your specialists as you may have planned  Please go to the LAB at the blood drawing area for the tests to be done  You will be contacted by phone if any changes need to be made immediately.  Otherwise, you will receive a letter about your results with an explanation, but please check with MyChart first.  Please remember to sign up for MyChart if you have not done so, as this will be important to you in the future with finding out test results, communicating by private email, and scheduling acute appointments online when needed.  Please make an Appointment to return in 6 months, or sooner if needed

## 2020-02-15 ENCOUNTER — Telehealth: Payer: Self-pay | Admitting: Emergency Medicine

## 2020-02-15 NOTE — Telephone Encounter (Signed)
Pt called back about lab results. Results were given and patient understood. Thanks.

## 2020-02-23 ENCOUNTER — Telehealth: Payer: Self-pay | Admitting: Internal Medicine

## 2020-02-23 NOTE — Telephone Encounter (Signed)
Spoke with the pt  She is c/o scratchy voice, hoarseness, PND, min cough with clear sputum x 1 wk  Denies f/c/s, body aches SOB, wheezing, sick contacts, travel  Has had both vaccines  She is taking astelin and zyrtec daily  Please advise recs thanks!  Allergies  Allergen Reactions  . 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 Sulfonamide Derivatives Itching and Rash   Current Outpatient Medications on File Prior to Visit  Medication Sig Dispense Refill  . ammonium lactate (AMLACTIN) 12 % lotion Apply 1 application topically as needed for dry skin. 400 g 0  . aspirin EC 325 MG tablet Take 1 tablet (325 mg total) by mouth 2 (two) times daily after a meal. Take x 1 month post op to decrease risk of blood clots. 60 tablet 0  . atorvastatin (LIPITOR) 40 MG tablet Take 1 tablet (40 mg total) by mouth daily. 90 tablet 3  . azelastine (ASTELIN) 0.1 % nasal spray Place 1 spray into both nostrils 2 (two) times daily. Use in each nostril as directed 90 mL 4  . Calcium Carb-Cholecalciferol (CALCIUM 600/VITAMIN D3 PO) Take 1 tablet by mouth daily.    . cetirizine (ZYRTEC) 10 MG tablet Take 1 tablet (10 mg total) by mouth daily. As needed 90 tablet 3  . diltiazem (DILACOR XR) 240 MG 24 hr capsule Take 1 capsule (240 mg total) by mouth daily. 90 capsule 1  . doxycycline (VIBRA-TABS) 100 MG tablet Take 1 tablet (100 mg total) by mouth 2 (two) times daily. 20 tablet 0  . DULoxetine (CYMBALTA) 20 MG capsule Take 2 capsules (40 mg total) by mouth daily. 180 capsule 3  . esomeprazole (NEXIUM) 40 MG capsule TAKE 1 CAPSULE BY MOUTH  DAILY 90 capsule 3  . FLUZONE HIGH-DOSE QUADRIVALENT 0.7 ML SUSY     . furosemide (LASIX) 80 MG tablet Take 1 tablet (80 mg total) by mouth 2 (two) times daily. 180 tablet 3  . gabapentin (NEURONTIN) 100 MG capsule TAKE 2 CAPSULES(200 MG) BY MOUTH AT BEDTIME 60 capsule 3  . losartan (COZAAR) 100 MG  tablet Take 1 tablet (100 mg total) by mouth daily. 90 tablet 3  . metoprolol tartrate (LOPRESSOR) 50 MG tablet TAKE 1 TABLET BY MOUTH  TWICE DAILY 180 tablet 2  . mupirocin ointment (BACTROBAN) 2 % Apply 1 application topically 2 (two) times daily. Use for infection as directed 15 g 0  . Polyethyl Glycol-Propyl Glycol (SYSTANE OP) Place 1 drop into both eyes 3 (three) times daily as needed (dry eyes).    . Polyethylene Glycol 3350 (MIRALAX PO) Take by mouth.    . potassium chloride (KLOR-CON) 10 MEQ tablet Take 1 tablet (10 mEq total) by mouth daily. 90 tablet 3  . rivaroxaban (XARELTO) 20 MG TABS tablet Take 1 tablet (20 mg total) by mouth daily with supper. To start after starter pack about January 18 2020 30 tablet 4  . Rivaroxaban 15 & 20 MG TBPK Follow package directions: Take one 15mg  tablet by mouth twice a day. On day 22, switch to one 20mg  tablet once a day. Take with food. 51 each 0  . tiZANidine (ZANAFLEX) 2 MG tablet Take 2 mg by mouth every 6 (six) hours as needed for muscle spasms.    . traMADol (ULTRAM) 50 MG tablet Take 50-100 mg by mouth every 6 (six) hours as needed for moderate pain.   0  .  traZODone (DESYREL) 50 MG tablet Take 0.5-1 tablets (25-50 mg total) by mouth at bedtime as needed for sleep. 90 tablet 1   No current facility-administered medications on file prior to visit.

## 2020-02-25 NOTE — Telephone Encounter (Signed)
Most likely this is seasonal pollen allergy, although an ordinary mild cold could feel similar. Suggest adding otc flonase nasal spray    2 puffs each nostril every night at bedtime. It may take a few days to kick in. Ok to use throat lozenges and otc cold remedies as needed. If not better in a few days, please let us know.

## 2020-02-26 NOTE — Telephone Encounter (Signed)
Called and spoke with patient.  Let her know Dr. Janee Morn recommendations.  Nothing further needed at this time

## 2020-02-27 DIAGNOSIS — M48062 Spinal stenosis, lumbar region with neurogenic claudication: Secondary | ICD-10-CM | POA: Diagnosis not present

## 2020-03-06 ENCOUNTER — Telehealth: Payer: Self-pay | Admitting: Internal Medicine

## 2020-03-06 NOTE — Telephone Encounter (Signed)
New message:   Pt is calling and states that [facility] will be sending [place] a fax about her gabapentin (NEURONTIN) 100 MG capsule 90 day supply. Please advise.

## 2020-03-07 ENCOUNTER — Other Ambulatory Visit: Payer: Self-pay

## 2020-03-07 MED ORDER — GABAPENTIN 100 MG PO CAPS: ORAL_CAPSULE | ORAL | 3 refills | Status: DC

## 2020-03-08 MED ORDER — GABAPENTIN 100 MG PO CAPS: 200.0000 mg | ORAL_CAPSULE | Freq: Every day | ORAL | 1 refills | Status: DC

## 2020-03-08 NOTE — Telephone Encounter (Signed)
Reviewed chart pt is up-to-date sent rx to [facility]../lmb  

## 2020-03-11 DIAGNOSIS — M1712 Unilateral primary osteoarthritis, left knee: Secondary | ICD-10-CM | POA: Diagnosis not present

## 2020-03-11 DIAGNOSIS — Z96651 Presence of right artificial knee joint: Secondary | ICD-10-CM | POA: Diagnosis not present

## 2020-03-11 DIAGNOSIS — M25562 Pain in left knee: Secondary | ICD-10-CM | POA: Diagnosis not present

## 2020-03-20 ENCOUNTER — Other Ambulatory Visit: Payer: Self-pay | Admitting: Internal Medicine

## 2020-03-22 ENCOUNTER — Other Ambulatory Visit: Payer: Self-pay | Admitting: *Deleted

## 2020-03-22 DIAGNOSIS — M25562 Pain in left knee: Secondary | ICD-10-CM

## 2020-03-22 DIAGNOSIS — N186 End stage renal disease: Secondary | ICD-10-CM

## 2020-03-24 ENCOUNTER — Other Ambulatory Visit: Payer: Self-pay | Admitting: Internal Medicine

## 2020-03-24 NOTE — Telephone Encounter (Signed)
Done erx 

## 2020-03-26 ENCOUNTER — Ambulatory Visit: Payer: Medicare Other | Admitting: Vascular Surgery

## 2020-03-26 ENCOUNTER — Other Ambulatory Visit: Payer: Self-pay

## 2020-03-26 ENCOUNTER — Encounter: Payer: Self-pay | Admitting: Vascular Surgery

## 2020-03-26 ENCOUNTER — Ambulatory Visit (HOSPITAL_COMMUNITY)
Admission: RE | Admit: 2020-03-26 | Discharge: 2020-03-26 | Disposition: A | Discharge status: Home / Self Care | Payer: Medicare Other | Source: Ambulatory Visit | Attending: Vascular Surgery | Admitting: Vascular Surgery

## 2020-03-26 VITALS — BP 132/73 | HR 63 | Temp 97.4°F | Resp 20 | Ht 63.0 in | Wt 221.0 lb

## 2020-03-26 DIAGNOSIS — M25562 Pain in left knee: Secondary | ICD-10-CM | POA: Diagnosis not present

## 2020-03-26 DIAGNOSIS — M25561 Pain in right knee: Secondary | ICD-10-CM | POA: Diagnosis not present

## 2020-03-26 DIAGNOSIS — I87309 Chronic venous hypertension (idiopathic) without complications of unspecified lower extremity: Secondary | ICD-10-CM

## 2020-03-26 NOTE — Progress Notes (Signed)
[facility] [place]  Patient name: [name] MRN: [id number] DOB: 02/16/1946 Sex: female  REASON FOR CONSULT: Evaluation bilateral blistering left leg  HPI: [name] is a 74 y.o. female, who is here today for evaluation of leg swelling.  She reports this has been been progressive over many years.  She is now developing some superficial blisters on her left lateral anterior skin with serous drainage from this.  She also has some excoriation over the medial aspect of her left leg above the ankle.  She has similar changes on the right but not as severe as those on the left.  She has no history of DVT.  She has worn compression in the past but reports that these were intolerable as she could not get them off and on.   Past Medical History:  Diagnosis Date  . Allergic rhinitis, cause unspecified   . Allergy    SEASONAL  . Anemia   . Cataract    Bilateral  . Chronic headaches   . Constipation   . Diverticulosis   . DJD (degenerative joint disease), lumbar   . GERD (gastroesophageal reflux disease)   . Hemorrhoids   . Hiatal hernia   . HTN (hypertension)   . Hyperlipidemia   . Obesity   . Osteoarthritis   . Tubular adenoma of colon     Family History  Problem Relation Age of Onset  . Heart disease Mother   . Hypertension Mother   . Diabetes Brother   . Breast cancer Sister   . Colon cancer Neg Hx   . Esophageal cancer Neg Hx   . Rectal cancer Neg Hx   . Stomach cancer Neg Hx     SOCIAL HISTORY: Social History   Socioeconomic History  . Marital status: Widowed    Spouse name: Not on file  . Number of children: 3  . Years of education: 73  . Highest education level: Not on file  Occupational History  . Occupation: retired    Fish farm manager: UNEMPLOYED  Tobacco Use  . Smoking status: Never Smoker  . Smokeless tobacco: Never Used  Substance and Sexual Activity  . Alcohol use: No   Alcohol/week: 0.0 standard drinks  . Drug use: No  . Sexual activity: Not Currently    Birth control/protection: Post-menopausal  Other Topics Concern  . Not on file  Social History Narrative  . Not on file   Social Determinants of Health   Financial Resource Strain:   . Difficulty of Paying Living Expenses:   Food Insecurity:   . Worried About Charity fundraiser in the Last Year:   . Arboriculturist in the Last Year:   Transportation Needs:   . Film/video editor (Medical):   Marland Kitchen Lack of Transportation (Non-Medical):   Physical Activity:   . Days of Exercise per Week:   . Minutes of Exercise per Session:   Stress:   . Feeling of Stress :   Social Connections:   . Frequency of Communication with Friends and Family:   . Frequency of Social Gatherings with Friends and Family:   . Attends Religious Services:   . Active Member of Clubs or Organizations:   . Attends Archivist Meetings:   Marland Kitchen Marital Status:   Intimate Partner Violence:   . Fear of Current or Ex-Partner:   . Emotionally Abused:   Marland Kitchen Physically Abused:   . Sexually Abused:     Allergies  Allergen Reactions  . 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 Sulfonamide Derivatives Itching and Rash    Current Outpatient Medications  Medication Sig Dispense Refill  . ammonium lactate (AMLACTIN) 12 % lotion Apply 1 application topically as needed for dry skin. 400 g 0  . aspirin EC 325 MG tablet Take 1 tablet (325 mg total) by mouth 2 (two) times daily after a meal. Take x 1 month post op to decrease risk of blood clots. 60 tablet 0  . atorvastatin (LIPITOR) 40 MG tablet Take 1 tablet (40 mg total) by mouth daily. 90 tablet 3  . azelastine (ASTELIN) 0.1 % nasal spray Place 1 spray into both nostrils 2 (two) times daily. Use in each nostril as directed 90 mL 4  . Calcium Carb-Cholecalciferol (CALCIUM 600/VITAMIN D3 PO) Take 1  tablet by mouth daily.    . cetirizine (ZYRTEC) 10 MG tablet Take 1 tablet (10 mg total) by mouth daily. As needed 90 tablet 3  . diltiazem (DILACOR XR) 240 MG 24 hr capsule Take 1 capsule (240 mg total) by mouth daily. 90 capsule 1  . doxycycline (VIBRA-TABS) 100 MG tablet Take 1 tablet (100 mg total) by mouth 2 (two) times daily. 20 tablet 0  . DULoxetine (CYMBALTA) 20 MG capsule Take 2 capsules (40 mg total) by mouth daily. 180 capsule 3  . esomeprazole (NEXIUM) 40 MG capsule TAKE 1 CAPSULE BY MOUTH  DAILY 90 capsule 3  . FLUZONE HIGH-DOSE QUADRIVALENT 0.7 ML SUSY     . furosemide (LASIX) 80 MG tablet Take 1 tablet (80 mg total) by mouth 2 (two) times daily. 180 tablet 3  . gabapentin (NEURONTIN) 100 MG capsule TAKE 2 CAPSULES(200 MG) BY MOUTH AT BEDTIME 60 capsule 3  . gabapentin (NEURONTIN) 100 MG capsule TAKE 2 CAPSULES(200 MG) BY MOUTH AT BEDTIME 60 capsule 3  . losartan (COZAAR) 100 MG tablet Take 1 tablet (100 mg total) by mouth daily. 90 tablet 3  . metoprolol tartrate (LOPRESSOR) 50 MG tablet TAKE 1 TABLET BY MOUTH  TWICE DAILY 180 tablet 2  . mupirocin ointment (BACTROBAN) 2 % Apply 1 application topically 2 (two) times daily. Use for infection as directed 15 g 0  . Polyethyl Glycol-Propyl Glycol (SYSTANE OP) Place 1 drop into both eyes 3 (three) times daily as needed (dry eyes).    . Polyethylene Glycol 3350 (MIRALAX PO) Take by mouth.    . potassium chloride (KLOR-CON) 10 MEQ tablet Take 1 tablet (10 mEq total) by mouth daily. 90 tablet 3  . rivaroxaban (XARELTO) 20 MG TABS tablet Take 1 tablet (20 mg total) by mouth daily with supper. To start after starter pack about January 18 2020 30 tablet 4  . tiZANidine (ZANAFLEX) 2 MG tablet Take 2 mg by mouth every 6 (six) hours as needed for muscle spasms.    . traMADol (ULTRAM) 50 MG tablet Take 50-100 mg by mouth every 6 (six) hours as needed for moderate pain.   0  . traZODone (DESYREL) 50 MG tablet TAKE 1/2-1 TABLET BY MOUTH AT BEDTIME AS  NEEDED FOR SLEEP 90 tablet 1   No current facility-administered medications for this visit.    REVIEW OF SYSTEMS:  [X]  denotes positive finding, [ ]  denotes negative finding Cardiac  Comments:  Chest pain or chest pressure:    Shortness of breath upon exertion:    Short of breath when lying flat: x  Irregular heart rhythm:        Vascular    Pain in calf, thigh, or hip brought on by ambulation: x   Pain in feet at night that wakes you up from your sleep:     Blood clot in your veins: x   Leg swelling:  x       Pulmonary    Oxygen at home:    Productive cough:     Wheezing:         Neurologic    Sudden weakness in arms or legs:  x   Sudden numbness in arms or legs:     Sudden onset of difficulty speaking or slurred speech:    Temporary loss of vision in one eye:     Problems with dizziness:  x       Gastrointestinal    Blood in stool:     Vomited blood:         Genitourinary    Burning when urinating:     Blood in urine:        Psychiatric    Major depression:         Hematologic    Bleeding problems:    Problems with blood clotting too easily:        Skin    Rashes or ulcers: x       Constitutional    Fever or chills:      PHYSICAL EXAM: Vitals:   03/26/20 1335  BP: 132/73  Pulse: 63  Resp: 20  Temp: (!) 97.4 F (36.3 C)  SpO2: 97%  Weight: 221 lb (100.2 kg)  Height: 5\' 3"  (1.6 m)    GENERAL: The patient is a well-nourished female, in no acute distress. The vital signs are documented above. CARDIOVASCULAR: 2+ radial and 2+ dorsalis pedis pulses bilaterally.  She does have some significant swelling in both legs from her knees distally.  She does have some superficial blisters over her anterior lateral left leg above the ankle and there is serous fluid weeping from these. PULMONARY: There is good air exchange  ABDOMEN: Soft and non-tender  MUSCULOSKELETAL: There are no major deformities or cyanosis. NEUROLOGIC: No focal weakness or paresthesias  are detected. SKIN: There are no ulcers or rashes noted. PSYCHIATRIC: The patient has a normal affect.  DATA:  Noninvasive studies reveal reflux in her deep and superficial venous system bilaterally.  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  Explained the critical importance of elevation when possible and compression also is much as possible.  Also described the importance of weight loss on controlling her symptoms.  She has had a very difficult time complying with compression garments in the past.  We wrapped her legs today with a 4 inch Ace wrap from her ankle to her knees and instructed her on the daily use of these.  She does have superficial venous vein reflux bilaterally and would be a candidate for ablation.  She also has deep venous reflux therefore would have persistent venous hypertension even with treatment of her superficial system.  I recommended that we see her again in 3 months following trial of elevation and compression.   Brandy Posner, MD FACS [facility] Tel [phone] Pager [phone]

## 2020-04-01 ENCOUNTER — Other Ambulatory Visit: Payer: Self-pay | Admitting: Internal Medicine

## 2020-04-01 NOTE — Telephone Encounter (Signed)
Please refill as per office routine med refill policy (all routine meds refilled for 3 mo or monthly per pt preference up to one year from last visit, then month to month grace period for 3 mo, then further med refills will have to be denied)  

## 2020-04-03 DIAGNOSIS — H0102A Squamous blepharitis right eye, upper and lower eyelids: Secondary | ICD-10-CM | POA: Diagnosis not present

## 2020-04-03 DIAGNOSIS — H1045 Other chronic allergic conjunctivitis: Secondary | ICD-10-CM | POA: Diagnosis not present

## 2020-04-03 DIAGNOSIS — H02423 Myogenic ptosis of bilateral eyelids: Secondary | ICD-10-CM | POA: Diagnosis not present

## 2020-04-03 DIAGNOSIS — H04123 Dry eye syndrome of bilateral lacrimal glands: Secondary | ICD-10-CM | POA: Diagnosis not present

## 2020-04-03 DIAGNOSIS — H0102B Squamous blepharitis left eye, upper and lower eyelids: Secondary | ICD-10-CM | POA: Diagnosis not present

## 2020-05-02 DIAGNOSIS — M48062 Spinal stenosis, lumbar region with neurogenic claudication: Secondary | ICD-10-CM | POA: Diagnosis not present

## 2020-05-04 ENCOUNTER — Other Ambulatory Visit: Payer: Self-pay | Admitting: Internal Medicine

## 2020-05-04 NOTE — Telephone Encounter (Signed)
Please refill as per office routine med refill policy (all routine meds refilled for 3 mo or monthly per pt preference up to one year from last visit, then month to month grace period for 3 mo, then further med refills will have to be denied)  

## 2020-05-09 DIAGNOSIS — Z96651 Presence of right artificial knee joint: Secondary | ICD-10-CM | POA: Diagnosis not present

## 2020-05-09 DIAGNOSIS — M25562 Pain in left knee: Secondary | ICD-10-CM | POA: Diagnosis not present

## 2020-05-09 DIAGNOSIS — M1712 Unilateral primary osteoarthritis, left knee: Secondary | ICD-10-CM | POA: Diagnosis not present

## 2020-05-24 ENCOUNTER — Telehealth: Payer: Self-pay | Admitting: Internal Medicine

## 2020-05-24 NOTE — Telephone Encounter (Signed)
The other option would be eliquis at 5 mig twice per day  Please ask pt to ask at the pharmacy if this would less expensive with her insurance, then we let us know if she wants to change

## 2020-05-24 NOTE — Telephone Encounter (Signed)
New message:   Pt is calling and states that her rivaroxaban (XARELTO) 20 MG TABS tablet used to be $47 and now it is $131. Pt states is there something else that can be sent in for her. Please advise.

## 2020-05-24 NOTE — Telephone Encounter (Signed)
Sent to Dr. John. 

## 2020-05-27 MED ORDER — APIXABAN 5 MG PO TABS: 5.0000 mg | ORAL_TABLET | Freq: Two times a day (BID) | ORAL | 2 refills | Status: DC

## 2020-05-27 NOTE — Telephone Encounter (Signed)
Sent to Dr. John. 

## 2020-05-27 NOTE — Telephone Encounter (Signed)
F/u   Please advise on current medication be discontinue  rivaroxaban (XARELTO) 20 MG TABS tablet

## 2020-05-27 NOTE — Telephone Encounter (Signed)
Ok to finish the xarelto she has left  Then eliquis 5 bid for next 3 mo, to finish her planned 6 mo treatment after her PE

## 2020-05-27 NOTE — Telephone Encounter (Signed)
F/U   The patient wants to switch over to eliquis is less expensive  [facility] [place], [address] AT [address]

## 2020-05-30 DIAGNOSIS — M48062 Spinal stenosis, lumbar region with neurogenic claudication: Secondary | ICD-10-CM | POA: Diagnosis not present

## 2020-05-30 DIAGNOSIS — R6889 Other general symptoms and signs: Secondary | ICD-10-CM | POA: Diagnosis not present

## 2020-05-30 NOTE — Telephone Encounter (Signed)
Spoke with patient and info given 

## 2020-06-26 ENCOUNTER — Other Ambulatory Visit: Payer: Self-pay | Admitting: Internal Medicine

## 2020-06-26 NOTE — Telephone Encounter (Signed)
Please refill as per office routine med refill policy (all routine meds refilled for 3 mo or monthly per pt preference up to one year from last visit, then month to month grace period for 3 mo, then further med refills will have to be denied)  

## 2020-07-02 DIAGNOSIS — Z961 Presence of intraocular lens: Secondary | ICD-10-CM | POA: Diagnosis not present

## 2020-07-02 DIAGNOSIS — H40053 Ocular hypertension, bilateral: Secondary | ICD-10-CM | POA: Diagnosis not present

## 2020-07-02 DIAGNOSIS — H0102B Squamous blepharitis left eye, upper and lower eyelids: Secondary | ICD-10-CM | POA: Diagnosis not present

## 2020-07-04 ENCOUNTER — Ambulatory Visit: Payer: Medicare Other | Admitting: Vascular Surgery

## 2020-07-09 ENCOUNTER — Other Ambulatory Visit: Payer: Self-pay | Admitting: Internal Medicine

## 2020-07-11 ENCOUNTER — Ambulatory Visit: Payer: Medicare Other | Admitting: Vascular Surgery

## 2020-07-11 ENCOUNTER — Other Ambulatory Visit: Payer: Self-pay

## 2020-07-11 ENCOUNTER — Encounter: Payer: Self-pay | Admitting: Vascular Surgery

## 2020-07-11 VITALS — BP 127/67 | HR 76 | Temp 97.9°F | Resp 18 | Ht 63.0 in | Wt 221.0 lb

## 2020-07-11 DIAGNOSIS — I872 Venous insufficiency (chronic) (peripheral): Secondary | ICD-10-CM

## 2020-07-11 DIAGNOSIS — R6889 Other general symptoms and signs: Secondary | ICD-10-CM | POA: Diagnosis not present

## 2020-07-11 NOTE — Progress Notes (Signed)
REASON FOR VISIT:   32-MONTH FOLLOW-UP VISIT.  MEDICAL ISSUES:   CHRONIC VENOUS INSUFFICIENCY: This patient has CEAP C4b venous disease.  She has significant leg swelling bilaterally.  She does have some superficial venous reflux however I do not think this is contributing significantly to her leg swelling.  She has deep venous reflux bilaterally.  We have discussed the importance of intermittent leg elevation and the proper positioning for this.  As she does not tolerate compression stockings we have instructed her on how to wrap her legs from the foot to the knee with two 4 inch aces on both sides.  She states that her family can help her with this.  I have encouraged her to avoid prolonged sitting and standing.  We have discussed the importance of exercise specifically walking and water aerobics.  In addition we have discussed the importance of nutrition given that central obesity especially increases lower extremity venous pressure.  I have also encouraged her to keep her skin well lubricated especially in the winter.  I would like to see her back in 1 year with follow-up venous studies given that if her superficial venous reflux does progress significantly she may be a candidate for laser ablation of the left great saphenous vein.    HPI:   [name] is a pleasant 74 y.o. female who was seen by Dr. Sherren Mocha Early on 03/26/2020 with chronic venous insufficiency.  She had leg swelling which had gradually progressed over many years.  She had developed some blistering on her left leg with serous drainage from this.  She had similar changes on the right that were not as severe.  She has no previous history of DVT.  She could not tolerate compression stockings.  On exam she was noted to have palpable dorsalis pedis pulses bilaterally.  She had significant leg swelling bilaterally from her knees down.  She had some blistering on the right leg with serous drainage.  On her noninvasive studies she  was noted to have both superficial and deep venous reflux.  Dr. Donnetta Hutching discussed with her the importance of leg elevation and continued compression therapy.  We also discussed the importance of weight loss.  She comes in for 28-month follow-up visit.  The patient's leg swelling has improved and she has had no further blistering.  She did develop a superficial ulceration on her lateral left leg from trying to get her compression stocking on.  She has a very difficult time getting on her compression stockings and for this reason has not been wearing them.  She does elevate her legs which helps with her swelling.  She describes some aching pain and heaviness in her legs which is aggravated by standing and sitting and relieved with elevation.  The symptoms are worse at the end of the day.  She is had no previous venous procedures.  She is had no previous history of DVT.  Past Medical History:  Diagnosis Date  . Allergic rhinitis, cause unspecified   . Allergy    SEASONAL  . Anemia   . Cataract    Bilateral  . Chronic headaches   . Constipation   . Diverticulosis   . DJD (degenerative joint disease), lumbar   . GERD (gastroesophageal reflux disease)   . Hemorrhoids   . Hiatal hernia   . HTN (hypertension)   . Hyperlipidemia   . Obesity   . Osteoarthritis   . Tubular adenoma of colon     Family History  Problem Relation Age of Onset  . Heart disease Mother   . Hypertension Mother   . Diabetes Brother   . Breast cancer Sister   . Colon cancer Neg Hx   . Esophageal cancer Neg Hx   . Rectal cancer Neg Hx   . Stomach cancer Neg Hx     SOCIAL HISTORY: Social History   Tobacco Use  . Smoking status: Never Smoker  . Smokeless tobacco: Never Used  Substance Use Topics  . Alcohol use: No    Alcohol/week: 0.0 standard drinks    Allergies  Allergen Reactions  . 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  Sulfonamide Derivatives Itching and Rash    Current Outpatient Medications  Medication Sig Dispense Refill  . ammonium lactate (AMLACTIN) 12 % lotion Apply 1 application topically as needed for dry skin. 400 g 0  . apixaban (ELIQUIS) 5 MG TABS tablet Take 1 tablet (5 mg total) by mouth 2 (two) times daily. 60 tablet 2  . aspirin EC 325 MG tablet Take 1 tablet (325 mg total) by mouth 2 (two) times daily after a meal. Take x 1 month post op to decrease risk of blood clots. 60 tablet 0  . atorvastatin (LIPITOR) 20 MG tablet TAKE 1 TABLET BY MOUTH  DAILY 90 tablet 3  . atorvastatin (LIPITOR) 40 MG tablet Take 1 tablet (40 mg total) by mouth daily. 90 tablet 3  . azelastine (ASTELIN) 0.1 % nasal spray Place 1 spray into both nostrils 2 (two) times daily. Use in each nostril as directed 90 mL 4  . Calcium Carb-Cholecalciferol (CALCIUM 600/VITAMIN D3 PO) Take 1 tablet by mouth daily.    . cetirizine (ZYRTEC) 10 MG tablet Take 1 tablet (10 mg total) by mouth daily. As needed 90 tablet 3  . DILT-XR 240 MG 24 hr capsule TAKE 1 CAPSULE BY MOUTH  DAILY 90 capsule 3  . doxycycline (VIBRA-TABS) 100 MG tablet Take 1 tablet (100 mg total) by mouth 2 (two) times daily. 20 tablet 0  . DULoxetine (CYMBALTA) 20 MG capsule Take 2 capsules (40 mg total) by mouth daily. 180 capsule 3  . esomeprazole (NEXIUM) 40 MG capsule TAKE 1 CAPSULE BY MOUTH  DAILY 90 capsule 3  . FLUZONE HIGH-DOSE QUADRIVALENT 0.7 ML SUSY     . furosemide (LASIX) 80 MG tablet Take 1 tablet (80 mg total) by mouth 2 (two) times daily. 180 tablet 3  . gabapentin (NEURONTIN) 100 MG capsule TAKE 2 CAPSULES(200 MG) BY MOUTH AT BEDTIME 60 capsule 3  . gabapentin (NEURONTIN) 100 MG capsule TAKE 2 CAPSULES(200 MG) BY MOUTH AT BEDTIME 60 capsule 3  . losartan (COZAAR) 100 MG tablet TAKE 1 TABLET BY MOUTH  DAILY 90 tablet 2  . metoprolol tartrate (LOPRESSOR) 50 MG tablet TAKE 1 TABLET BY MOUTH  TWICE DAILY 180 tablet 2  . mupirocin ointment (BACTROBAN) 2  % Apply 1 application topically 2 (two) times daily. Use for infection as directed 15 g 0  . Polyethyl Glycol-Propyl Glycol (SYSTANE OP) Place 1 drop into both eyes 3 (three) times daily as needed (dry eyes).    . Polyethylene Glycol 3350 (MIRALAX PO) Take by mouth.    . potassium chloride (KLOR-CON) 10 MEQ tablet TAKE 1 TABLET(10 MEQ) BY MOUTH DAILY 90 tablet 3  . tiZANidine (ZANAFLEX) 2 MG tablet Take 2 mg by mouth every 6 (six) hours as needed for muscle  spasms.    . traMADol (ULTRAM) 50 MG tablet Take 50-100 mg by mouth every 6 (six) hours as needed for moderate pain.   0  . traZODone (DESYREL) 50 MG tablet TAKE 1/2-1 TABLET BY MOUTH AT BEDTIME AS NEEDED FOR SLEEP 90 tablet 1   No current facility-administered medications for this visit.    REVIEW OF SYSTEMS:  [X]  denotes positive finding, [ ]  denotes negative finding Cardiac  Comments:  Chest pain or chest pressure:    Shortness of breath upon exertion: x   Short of breath when lying flat:    Irregular heart rhythm:        Vascular    Pain in calf, thigh, or hip brought on by ambulation:    Pain in feet at night that wakes you up from your sleep:     Blood clot in your veins:    Leg swelling:  x       Pulmonary    Oxygen at home:    Productive cough:     Wheezing:         Neurologic    Sudden weakness in arms or legs:     Sudden numbness in arms or legs:     Sudden onset of difficulty speaking or slurred speech:    Temporary loss of vision in one eye:     Problems with dizziness:         Gastrointestinal    Blood in stool:     Vomited blood:         Genitourinary    Burning when urinating:     Blood in urine:        Psychiatric    Major depression:         Hematologic    Bleeding problems:    Problems with blood clotting too easily:        Skin    Rashes or ulcers:        Constitutional    Fever or chills:     PHYSICAL EXAM:   Vitals:   07/11/20 1603  BP: 127/67  Pulse: 76  Resp: 18  Temp: 97.9 F  (36.6 C)  TempSrc: Temporal  SpO2: 100%  Weight: 221 lb (100.2 kg)  Height: 5\' 3"  (1.6 m)   Body mass index is 39.15 kg/m.  GENERAL: The patient is a well-nourished female, in no acute distress. The vital signs are documented above. CARDIAC: There is a regular rate and rhythm.  VASCULAR: I do not detect carotid bruits. She has palpable dorsalis pedis pulses. She has hyperpigmentation bilaterally consistent with chronic venous insufficiency.  She has not some lipodermatosclerosis.      I did look at her left great saphenous vein myself with the SonoSite.  She does have reflux in the left great saphenous vein from the saphenofemoral junction to the distal thigh.  However the vein is not especially dilated.  In addition her thighs are very large and the vein is quite deep. PULMONARY: There is good air exchange bilaterally without wheezing or rales. ABDOMEN: Soft and non-tender with normal pitched bowel sounds.  MUSCULOSKELETAL: There are no major deformities or cyanosis. NEUROLOGIC: No focal weakness or paresthesias are detected. SKIN: There are no ulcers or rashes noted. PSYCHIATRIC: The patient has a normal affect.  DATA:     VENOUS DUPLEX: I have reviewed the venous duplex scan that was done on 03/26/2020.  On the right side there is no evidence of DVT or superficial venous thrombosis.  There is deep venous reflux involving the common femoral vein and popliteal vein.  There is superficial venous reflux at the saphenofemoral junction in the mid thigh however the vein is not dilated.  On the left side there is no evidence of DVT or superficial venous thrombosis.  There is deep venous reflux involving the common femoral vein, femoral vein, and popliteal vein.  There is superficial venous reflux in the left great saphenous vein from the saphenofemoral junction to the distal thigh.  The vein however is not especially dilated.  Deitra Mayo [facility]  [facility] [phone]

## 2020-07-12 ENCOUNTER — Ambulatory Visit (INDEPENDENT_AMBULATORY_CARE_PROVIDER_SITE_OTHER): Payer: Medicare Other | Admitting: Family

## 2020-07-12 VITALS — BP 118/68 | HR 84 | Temp 98.3°F | Ht 63.0 in | Wt 220.0 lb

## 2020-07-12 DIAGNOSIS — G4700 Insomnia, unspecified: Secondary | ICD-10-CM

## 2020-07-12 DIAGNOSIS — Z23 Encounter for immunization: Secondary | ICD-10-CM

## 2020-07-12 DIAGNOSIS — I872 Venous insufficiency (chronic) (peripheral): Secondary | ICD-10-CM | POA: Diagnosis not present

## 2020-07-12 MED ORDER — TRIAMCINOLONE ACETONIDE 0.1 % EX CREA: 1.0000 "application " | TOPICAL_CREAM | Freq: Two times a day (BID) | CUTANEOUS | 0 refills | Status: DC

## 2020-07-12 MED ORDER — HYDROXYZINE HCL 10 MG PO TABS: 10.0000 mg | ORAL_TABLET | Freq: Three times a day (TID) | ORAL | 0 refills | Status: DC | PRN

## 2020-07-12 NOTE — Progress Notes (Signed)
[name] is a 74 y.o. female with the following history as recorded in EpicCare:  Patient Active Problem List   Diagnosis Date Noted  . Cellulitis of left leg 01/11/2020  . Pulmonary embolism ([place]) 12/28/2019  . Leg wound, left 11/12/2019  . Insomnia 09/23/2019  . Peripheral edema 07/20/2019  . Primary osteoarthritis of right knee 04/14/2019  . Hyperglycemia 04/13/2018  . Acute bursitis of right shoulder 08/16/2017  . Abnormal CXR 07/26/2017  . Unexplained night sweats 07/26/2017  . Greater trochanteric bursitis of right hip 06/23/2017  . Sweating abnormality 03/09/2017  . Gingivitis 03/09/2017  . Herpes zoster without complication 64/33/2951  . Trapezoid ligament sprain, right, initial encounter 12/11/2016  . Fasciculation 12/11/2016  . Acute bronchitis 11/16/2016  . Degenerative joint disease of left shoulder 10/07/2016  . Degenerative arthritis of knee, bilateral 09/16/2016  . Spinal stenosis, lumbar region, with neurogenic claudication 06/17/2016  . Compression fracture of L1 lumbar vertebra ([place]) 03/31/2016  . Pelvic mass in female 03/31/2016  . Urinary frequency 02/27/2016  . 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 Carpal tunnel syndrome 09/18/2014  . Sprain of ankle, unspecified site 07/04/2014  . Primary localized osteoarthrosis, lower leg 05/21/2014  . Left knee pain 03/15/2014  . Food allergy 10/05/2013  . Eustachian tube dysfunction, left 04/24/2013  . Degenerative arthritis of left knee 04/24/2013  . Hot flashes 04/24/2013  . External otitis of left ear 12/15/2012  . Right lumbar radiculitis 04/25/2012  . Seasonal and perennial allergic rhinitis 03/18/2012  . Hearing loss in right ear 03/18/2012  . Esophageal dysphagia 02/11/2012  . Dysphagia 01/20/2012  . Rash 01/20/2012  . Bilateral knee pain 01/20/2012  . Encounter for well adult exam with abnormal findings 01/14/2012  . GERD (gastroesophageal reflux disease) 01/14/2012  . HTN (hypertension)  01/14/2012  . Hyperlipidemia 01/14/2012  . Obesity 01/14/2012  . Osteoarthritis 01/14/2012  . DJD (degenerative joint disease), lumbar 01/14/2012  . Palpitations 11/21/2010  . Dyspnea on exertion 11/21/2010    Current Outpatient Medications  Medication Sig Dispense Refill  . apixaban (ELIQUIS) 5 MG TABS tablet Take 1 tablet (5 mg total) by mouth 2 (two) times daily. 60 tablet 2  . atorvastatin (LIPITOR) 20 MG tablet TAKE 1 TABLET BY MOUTH  DAILY 90 tablet 3  . atorvastatin (LIPITOR) 40 MG tablet Take 1 tablet (40 mg total) by mouth daily. 90 tablet 3  . azelastine (ASTELIN) 0.1 % nasal spray Place 1 spray into both nostrils 2 (two) times daily. Use in each nostril as directed 90 mL 4  . Calcium Carb-Cholecalciferol (CALCIUM 600/VITAMIN D3 PO) Take 1 tablet by mouth daily.    . cetirizine (ZYRTEC) 10 MG tablet Take 1 tablet (10 mg total) by mouth daily. As needed 90 tablet 3  . DILT-XR 240 MG 24 hr capsule TAKE 1 CAPSULE BY MOUTH  DAILY 90 capsule 3  . DULoxetine (CYMBALTA) 20 MG capsule Take 2 capsules (40 mg total) by mouth daily. 180 capsule 3  . esomeprazole (NEXIUM) 40 MG capsule TAKE 1 CAPSULE BY MOUTH  DAILY 90 capsule 3  . FLUZONE HIGH-DOSE QUADRIVALENT 0.7 ML SUSY     . furosemide (LASIX) 80 MG tablet Take 1 tablet (80 mg total) by mouth 2 (two) times daily. 180 tablet 3  . gabapentin (NEURONTIN) 100 MG capsule TAKE 2 CAPSULES(200 MG) BY MOUTH AT BEDTIME 60 capsule 3  . losartan (COZAAR) 100 MG tablet TAKE 1 TABLET BY MOUTH  DAILY 90 tablet 2  . metoprolol tartrate (LOPRESSOR)  50 MG tablet TAKE 1 TABLET BY MOUTH  TWICE DAILY 180 tablet 2  . Polyethyl Glycol-Propyl Glycol (SYSTANE OP) Place 1 drop into both eyes 3 (three) times daily as needed (dry eyes).    . Polyethylene Glycol 3350 (MIRALAX PO) Take by mouth.    . potassium chloride (KLOR-CON) 10 MEQ tablet TAKE 1 TABLET(10 MEQ) BY MOUTH DAILY 90 tablet 3  . Prednisol Ace-Moxiflox-Bromfen 1-0.5-0.075 % SUSP Place 1 drop into the  right eye 4 (four) times daily.    Marland Kitchen tiZANidine (ZANAFLEX) 2 MG tablet Take 2 mg by mouth every 6 (six) hours as needed for muscle spasms.    . traMADol (ULTRAM) 50 MG tablet Take 50-100 mg by mouth every 6 (six) hours as needed for moderate pain.   0  . hydrOXYzine (ATARAX/VISTARIL) 10 MG tablet Take 1 tablet (10 mg total) by mouth every 8 (eight) hours as needed for itching. 30 tablet 0  . triamcinolone cream (KENALOG) 0.1 % Apply 1 application topically 2 (two) times daily. 453 g 0   No current facility-administered medications for this visit.    Allergies: Soybean-containing drug products and Sulfonamide derivatives  Past Medical History:  Diagnosis Date  . Allergic rhinitis, cause unspecified   . Allergy    SEASONAL  . Anemia   . Cataract    Bilateral  . Chronic headaches   . Constipation   . Diverticulosis   . DJD (degenerative joint disease), lumbar   . GERD (gastroesophageal reflux disease)   . Hemorrhoids   . Hiatal hernia   . HTN (hypertension)   . Hyperlipidemia   . Obesity   . Osteoarthritis   . Tubular adenoma of colon     Past Surgical History:  Procedure Laterality Date  . ABLATION    . COLONOSCOPY    . 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 TUBAL LIGATION      Family History  Problem Relation Age of Onset  . Heart disease Mother   . Hypertension Mother   . Diabetes Brother   . Breast cancer Sister   . Colon cancer Neg Hx   . Esophageal cancer Neg Hx   . Rectal cancer Neg Hx   . Stomach cancer Neg Hx     Social History   Tobacco Use  . Smoking status: Never Smoker  . Smokeless tobacco: Never Used  Substance Use Topics  . Alcohol use: No    Alcohol/week: 0.0 standard drinks    Subjective:  Saw her vascular specialist with concerns for persisting pain/ swelling- that appointment was yesterday; she did feel  comfortable with the recommendations given; notes that her legs do itch quite a bit especially at night and is not sleeping well due to the itching and her anxiety; she is not moisturizing her legs as directed- notes she did not like the Lac-Hydrin that was given earlier this year;   Objective:  Vitals:   07/12/20 1050  BP: 118/68  Pulse: 84  Temp: 98.3 F (36.8 C)  TempSrc: Oral  SpO2: 98%  Weight: 220 lb (99.8 kg)  Height: 5\' 3"  (1.6 m)    General: Well developed, well nourished, in no acute distress  Skin : Warm and dry. Dry, scaly skin noted bilateral in lower extremities; Head: Normocephalic and atraumatic  Lungs: Respirations unlabored;  Neurologic: Alert and oriented; speech intact; face symmetrical; uses walker  Assessment:  1. Venous insufficiency   2. Insomnia, unspecified type   3. Needs flu shot     Plan:  1. Reviewed recommendations that were given to her by her vascular provider and she is comfortable with need to wrap legs/ elevate as much as possible; also encouraged to use thick moisturizing cream like Aveeno, Eucerin or Cetaphil; can also use topical Triamcinolone as needed- I did prescribe a jar of Triamcinolone for her to use as needed; stressed that she needs to use this sparingly; 2. Trial of Atarax 10 mg at night to help with itching and sleep; she will let her PCP know if she wants a higher dose; she notes she does not like Trazodone and this was taken off her medication list; 3. Flu shot given;  This visit occurred during the SARS-CoV-2 public health emergency.  Safety protocols were in place, including screening questions prior to the visit, additional usage of staff PPE, and extensive cleaning of exam room while observing appropriate contact time as indicated for disinfecting solutions.    No follow-ups on file.  Orders Placed This Encounter  Procedures  . Flu Vaccine QUAD High Dose(Fluad)    Requested Prescriptions   Signed Prescriptions Disp  Refills  . hydrOXYzine (ATARAX/VISTARIL) 10 MG tablet 30 tablet 0    Sig: Take 1 tablet (10 mg total) by mouth every 8 (eight) hours as needed for itching.  . triamcinolone cream (KENALOG) 0.1 % 453 g 0    Sig: Apply 1 application topically 2 (two) times daily.

## 2020-07-25 ENCOUNTER — Other Ambulatory Visit: Payer: Self-pay | Admitting: Internal Medicine

## 2020-07-26 ENCOUNTER — Telehealth: Payer: Self-pay | Admitting: Internal Medicine

## 2020-07-26 MED ORDER — HYDROXYZINE HCL 10 MG PO TABS: 10.0000 mg | ORAL_TABLET | Freq: Three times a day (TID) | ORAL | 1 refills | Status: DC | PRN

## 2020-07-26 NOTE — Telephone Encounter (Signed)
Done erx 

## 2020-07-26 NOTE — Telephone Encounter (Signed)
Please refill as per office routine med refill policy (all routine meds refilled for 3 mo or monthly per pt preference up to one year from last visit, then month to month grace period for 3 mo, then further med refills will have to be denied)  

## 2020-07-26 NOTE — Telephone Encounter (Signed)
hydrOXYzine (ATARAX/VISTARIL) 10 MG tablet  [phone] - [place], [address] AT [place] Phone:  [phone]  Fax:  [phone]     Last appt: 9.24.21 Jodi Mourning Next appt: 10.20.21

## 2020-07-26 NOTE — Telephone Encounter (Signed)
Sent to Dr. John. 

## 2020-08-01 DIAGNOSIS — Z96651 Presence of right artificial knee joint: Secondary | ICD-10-CM | POA: Diagnosis not present

## 2020-08-01 DIAGNOSIS — Z96652 Presence of left artificial knee joint: Secondary | ICD-10-CM | POA: Diagnosis not present

## 2020-08-01 DIAGNOSIS — R6889 Other general symptoms and signs: Secondary | ICD-10-CM | POA: Diagnosis not present

## 2020-08-06 ENCOUNTER — Other Ambulatory Visit: Payer: Self-pay

## 2020-08-07 ENCOUNTER — Telehealth: Payer: Self-pay

## 2020-08-07 ENCOUNTER — Encounter: Payer: Self-pay | Admitting: Internal Medicine

## 2020-08-07 ENCOUNTER — Ambulatory Visit (INDEPENDENT_AMBULATORY_CARE_PROVIDER_SITE_OTHER): Payer: Medicare Other | Admitting: Internal Medicine

## 2020-08-07 VITALS — BP 140/78 | HR 92 | Temp 98.4°F | Ht 63.0 in | Wt 217.0 lb

## 2020-08-07 DIAGNOSIS — I2699 Other pulmonary embolism without acute cor pulmonale: Secondary | ICD-10-CM | POA: Diagnosis not present

## 2020-08-07 DIAGNOSIS — E785 Hyperlipidemia, unspecified: Secondary | ICD-10-CM

## 2020-08-07 DIAGNOSIS — G4700 Insomnia, unspecified: Secondary | ICD-10-CM | POA: Diagnosis not present

## 2020-08-07 DIAGNOSIS — L989 Disorder of the skin and subcutaneous tissue, unspecified: Secondary | ICD-10-CM | POA: Diagnosis not present

## 2020-08-07 DIAGNOSIS — I700 Atherosclerosis of aorta: Secondary | ICD-10-CM

## 2020-08-07 DIAGNOSIS — L299 Pruritus, unspecified: Secondary | ICD-10-CM

## 2020-08-07 DIAGNOSIS — R739 Hyperglycemia, unspecified: Secondary | ICD-10-CM | POA: Diagnosis not present

## 2020-08-07 LAB — CBC WITH DIFFERENTIAL/PLATELET
Basophils Absolute: 0.1 10*3/uL (ref 0.0–0.1)
Basophils Relative: 0.7 % (ref 0.0–3.0)
Eosinophils Absolute: 0.5 10*3/uL (ref 0.0–0.7)
Eosinophils Relative: 3.8 % (ref 0.0–5.0)
HCT: 23.1 % — CL (ref 36.0–46.0)
Hemoglobin: 7.1 g/dL — CL (ref 12.0–15.0)
Lymphocytes Relative: 21.6 % (ref 12.0–46.0)
Lymphs Abs: 2.8 10*3/uL (ref 0.7–4.0)
MCHC: 30.5 g/dL (ref 30.0–36.0)
MCV: 66.1 fl — ABNORMAL LOW (ref 78.0–100.0)
Monocytes Absolute: 1.3 10*3/uL — ABNORMAL HIGH (ref 0.1–1.0)
Monocytes Relative: 9.9 % (ref 3.0–12.0)
Neutro Abs: 8.3 10*3/uL — ABNORMAL HIGH (ref 1.4–7.7)
Neutrophils Relative %: 64 % (ref 43.0–77.0)
Platelets: 569 10*3/uL — ABNORMAL HIGH (ref 150.0–400.0)
RBC: 3.5 Mil/uL — ABNORMAL LOW (ref 3.87–5.11)
RDW: 18.9 % — ABNORMAL HIGH (ref 11.5–15.5)
WBC: 12.9 10*3/uL — ABNORMAL HIGH (ref 4.0–10.5)

## 2020-08-07 LAB — HEPATIC FUNCTION PANEL
ALT: 15 U/L (ref 0–35)
AST: 12 U/L (ref 0–37)
Albumin: 4 g/dL (ref 3.5–5.2)
Alkaline Phosphatase: 92 U/L (ref 39–117)
Bilirubin, Direct: 0.1 mg/dL (ref 0.0–0.3)
Total Bilirubin: 0.4 mg/dL (ref 0.2–1.2)
Total Protein: 6.7 g/dL (ref 6.0–8.3)

## 2020-08-07 LAB — LIPID PANEL
Cholesterol: 145 mg/dL (ref 0–200)
HDL: 60.9 mg/dL (ref >39.00)
LDL Cholesterol: 63 mg/dL (ref 0–99)
NonHDL: 83.87
Total CHOL/HDL Ratio: 2
Triglycerides: 102 mg/dL (ref 0.0–149.0)
VLDL: 20.4 mg/dL (ref 0.0–40.0)

## 2020-08-07 LAB — BASIC METABOLIC PANEL
BUN: 27 mg/dL — ABNORMAL HIGH (ref 6–23)
CO2: 31 mEq/L (ref 19–32)
Calcium: 9.6 mg/dL (ref 8.4–10.5)
Chloride: 99 mEq/L (ref 96–112)
Creatinine, Ser: 0.99 mg/dL (ref 0.40–1.20)
GFR: 56.33 mL/min — ABNORMAL LOW (ref >60.00)
Glucose, Bld: 102 mg/dL — ABNORMAL HIGH (ref 70–99)
Potassium: 3.8 mEq/L (ref 3.5–5.1)
Sodium: 137 mEq/L (ref 135–145)

## 2020-08-07 LAB — HEMOGLOBIN A1C: Hgb A1c MFr Bld: 6.8 % — ABNORMAL HIGH (ref 4.6–6.5)

## 2020-08-07 MED ORDER — HYDROXYZINE HCL 10 MG PO TABS: 10.0000 mg | ORAL_TABLET | Freq: Three times a day (TID) | ORAL | 2 refills | Status: DC | PRN

## 2020-08-07 NOTE — Progress Notes (Signed)
Subjective:    Patient ID: [name], female    DOB: Feb 20, 1946, 74 y.o.   MRN: [id number]  HPI   Here to f/u; overall doing ok,  Pt denies chest pain, increasing sob or doe, wheezing, orthopnea, PND, increased LE swelling, palpitations, dizziness or syncope.  Pt denies new neurological symptoms such as new headache, or facial or extremity weakness or numbness.  Pt denies polydipsia, polyuria, or low sugar episode.  Pt states overall good compliance with meds, mostly trying to follow appropriate diet, with wt overall stable,  but little exercise however. Hsa ongoing mild low mood, ahrd to get to sleep most nights until 2AM, then back up at 7-8 am. Has now been 6 mo since her PE.  Has a left leg skin lesion, asks for derm referral.  Tolerating new statin.  No overt bleeding. Past Medical History:  Diagnosis Date  . Allergic rhinitis, cause unspecified   . Allergy    SEASONAL  . Anemia   . Cataract    Bilateral  . Chronic headaches   . Constipation   . Diverticulosis   . DJD (degenerative joint disease), lumbar   . GERD (gastroesophageal reflux disease)   . Hemorrhoids   . Hiatal hernia   . HTN (hypertension)   . Hyperlipidemia   . Obesity   . Osteoarthritis   . Tubular adenoma of colon    Past Surgical History:  Procedure Laterality Date  . ABLATION    . COLONOSCOPY    . 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 TUBAL LIGATION      reports that she has never smoked. She has never used smokeless tobacco. She reports that she does not drink alcohol and does not use drugs. family history includes Breast cancer in her sister; Diabetes in her brother; Heart disease in her mother; Hypertension in her mother. Allergies  Allergen Reactions  . Soybean-Containing Drug Products Other (See Comments)    Allergy testing positive for soy beans but pt  uses soy sauce  . Sulfonamide Derivatives Itching and Rash   No current facility-administered medications on file prior to visit.   Current Outpatient Medications on File Prior to Visit  Medication Sig Dispense Refill  . atorvastatin (LIPITOR) 20 MG tablet TAKE 1 TABLET BY MOUTH  DAILY 90 tablet 3  . atorvastatin (LIPITOR) 40 MG tablet Take 1 tablet (40 mg total) by mouth daily. (Patient not taking: Reported on 08/12/2020) 90 tablet 3  . azelastine (ASTELIN) 0.1 % nasal spray Place 1 spray into both nostrils 2 (two) times daily. Use in each nostril as directed (Patient taking differently: Place 1 spray into both nostrils daily as needed for allergies. Use in each nostril as directed ) 90 mL 4  . cetirizine (ZYRTEC) 10 MG tablet Take 1 tablet (10 mg total) by mouth daily. As needed (Patient taking differently: Take 10 mg by mouth daily as needed for allergies. As needed) 90 tablet 3  . DILT-XR 240 MG 24 hr capsule TAKE 1 CAPSULE BY MOUTH  DAILY 90 capsule 3  . DULoxetine (CYMBALTA) 20 MG capsule Take 2 capsules (40 mg total) by mouth daily. 180 capsule 3  . esomeprazole (NEXIUM) 40 MG capsule TAKE 1 CAPSULE BY MOUTH  DAILY 90 capsule 3  . FLUZONE HIGH-DOSE QUADRIVALENT 0.7 ML SUSY     . furosemide (LASIX) 80 MG tablet Take  1 tablet (80 mg total) by mouth 2 (two) times daily. 180 tablet 3  . gabapentin (NEURONTIN) 100 MG capsule TAKE 2 CAPSULES(200 MG) BY MOUTH AT BEDTIME 60 capsule 3  . losartan (COZAAR) 100 MG tablet TAKE 1 TABLET BY MOUTH  DAILY 90 tablet 2  . metoprolol tartrate (LOPRESSOR) 50 MG tablet TAKE 1 TABLET BY MOUTH  TWICE DAILY 180 tablet 3  . Polyethyl Glycol-Propyl Glycol (SYSTANE OP) Place 1 drop into both eyes 3 (three) times daily as needed (dry eyes).    . Polyethylene Glycol 3350 (MIRALAX PO) Take 17 g by mouth daily as needed (constipation).     . potassium chloride (KLOR-CON) 10 MEQ tablet TAKE 1 TABLET(10 MEQ) BY MOUTH DAILY (Patient taking differently: Take 10 mEq by  mouth daily. ) 90 tablet 3  . Prednisol Ace-Moxiflox-Bromfen 1-0.5-0.075 % SUSP Place 1 drop into the right eye 4 (four) times daily.    Marland Kitchen tiZANidine (ZANAFLEX) 2 MG tablet Take 2 mg by mouth every 6 (six) hours as needed for muscle spasms.    . traMADol (ULTRAM) 50 MG tablet Take 50-100 mg by mouth every 6 (six) hours as needed for moderate pain.   0  . triamcinolone cream (KENALOG) 0.1 % Apply 1 application topically 2 (two) times daily. (Patient not taking: Reported on 08/12/2020) 453 g 0   Review of Systems All otherwise neg per pt    Objective:   Physical Exam BP 140/78 (BP Location: Left Arm, Patient Position: Sitting, Cuff Size: Large)   Pulse 92   Temp 98.4 F (36.9 C) (Oral)   Ht 5\' 3"  (1.6 m)   Wt 217 lb (98.4 kg)   SpO2 95%   BMI 38.44 kg/m  VS noted,  Constitutional: Pt appears in NAD HENT: Head: NCAT.  Right Ear: External ear normal.  Left Ear: External ear normal.  Eyes: . Pupils are equal, round, and reactive to light. Conjunctivae and EOM are normal Nose: without d/c or deformity Neck: Neck supple. Gross normal ROM Cardiovascular: Normal rate and regular rhythm.   Pulmonary/Chest: Effort normal and breath sounds without rales or wheezing.  Abd:  Soft, NT, ND, + BS, no organomegaly Neurological: Pt is alert. At baseline orientation, motor grossly intact Skin: Skin is warm. no LE edema, left leg anterior light skin lesoin with dark spots  Psychiatric: Pt behavior is normal without agitation  All otherwise neg per pt Lab Results  Component Value Date   WBC 28.1 (H) 08/13/2020   HGB 7.1 (L) 08/13/2020   HCT 24.5 (L) 08/13/2020   PLT 399 08/13/2020   GLUCOSE 104 (H) 08/13/2020   CHOL 145 08/07/2020   TRIG 102.0 08/07/2020   HDL 60.90 08/07/2020   LDLDIRECT 168.8 01/20/2012   LDLCALC 63 08/07/2020   ALT 15 08/13/2020   AST 18 08/13/2020   NA 135 08/13/2020   K 3.7 08/13/2020   CL 101 08/13/2020   CREATININE 1.01 (H) 08/13/2020   BUN 16 08/13/2020   CO2  22 08/13/2020   TSH 0.96 02/06/2020   INR 1.2 08/13/2020   HGBA1C 6.8 (H) 08/07/2020      Assessment & Plan:

## 2020-08-07 NOTE — Patient Instructions (Signed)
Ok to STOP the eliquis since it has been 6 months of therapy  Ok to try the Melatonin OTC up to 10 mg at night for sleep  Please continue all other medications as before, and refills have been done if requested - the itching medication  Please have the pharmacy call with any other refills you may need.  Please continue your efforts at being more active, low cholesterol diet, and weight control.  Please keep your appointments with your specialists as you may have planned  You will be contacted regarding the referral for: Dermatology for the leg skin lesion  Please go to the LAB at the blood drawing area for the tests to be done  You will be contacted by phone if any changes need to be made immediately.  Otherwise, you will receive a letter about your results with an explanation, but please check with MyChart first.  Please remember to sign up for MyChart if you have not done so, as this will be important to you in the future with finding out test results, communicating by private email, and scheduling acute appointments online when needed.  Please make an Appointment to return in 6 months, or sooner if needed

## 2020-08-12 ENCOUNTER — Emergency Department (HOSPITAL_COMMUNITY): Payer: Medicare Other

## 2020-08-12 ENCOUNTER — Inpatient Hospital Stay (HOSPITAL_COMMUNITY)
Admission: EM | Admit: 2020-08-12 | Discharge: 2020-08-24 | DRG: 872 | Disposition: A | Discharge status: Skilled Nursing Facility | Payer: Medicare Other | Attending: Internal Medicine | Admitting: Internal Medicine

## 2020-08-12 DIAGNOSIS — Z743 Need for continuous supervision: Secondary | ICD-10-CM | POA: Diagnosis not present

## 2020-08-12 DIAGNOSIS — Z66 Do not resuscitate: Secondary | ICD-10-CM | POA: Diagnosis present

## 2020-08-12 DIAGNOSIS — W19XXXD Unspecified fall, subsequent encounter: Secondary | ICD-10-CM | POA: Diagnosis not present

## 2020-08-12 DIAGNOSIS — M549 Dorsalgia, unspecified: Secondary | ICD-10-CM | POA: Diagnosis present

## 2020-08-12 DIAGNOSIS — R238 Other skin changes: Secondary | ICD-10-CM | POA: Diagnosis not present

## 2020-08-12 DIAGNOSIS — E869 Volume depletion, unspecified: Secondary | ICD-10-CM | POA: Diagnosis not present

## 2020-08-12 DIAGNOSIS — R609 Edema, unspecified: Secondary | ICD-10-CM | POA: Diagnosis not present

## 2020-08-12 DIAGNOSIS — Z6838 Body mass index (BMI) 38.0-38.9, adult: Secondary | ICD-10-CM | POA: Diagnosis not present

## 2020-08-12 DIAGNOSIS — Z86711 Personal history of pulmonary embolism: Secondary | ICD-10-CM | POA: Diagnosis not present

## 2020-08-12 DIAGNOSIS — Z8249 Family history of ischemic heart disease and other diseases of the circulatory system: Secondary | ICD-10-CM | POA: Diagnosis not present

## 2020-08-12 DIAGNOSIS — W19XXXA Unspecified fall, initial encounter: Secondary | ICD-10-CM | POA: Diagnosis present

## 2020-08-12 DIAGNOSIS — M6259 Muscle wasting and atrophy, not elsewhere classified, multiple sites: Secondary | ICD-10-CM | POA: Diagnosis not present

## 2020-08-12 DIAGNOSIS — I5032 Chronic diastolic (congestive) heart failure: Secondary | ICD-10-CM | POA: Diagnosis not present

## 2020-08-12 DIAGNOSIS — Z882 Allergy status to sulfonamides status: Secondary | ICD-10-CM

## 2020-08-12 DIAGNOSIS — H9191 Unspecified hearing loss, right ear: Secondary | ICD-10-CM | POA: Diagnosis not present

## 2020-08-12 DIAGNOSIS — M47816 Spondylosis without myelopathy or radiculopathy, lumbar region: Secondary | ICD-10-CM | POA: Diagnosis not present

## 2020-08-12 DIAGNOSIS — N179 Acute kidney failure, unspecified: Secondary | ICD-10-CM | POA: Diagnosis not present

## 2020-08-12 DIAGNOSIS — Z7409 Other reduced mobility: Secondary | ICD-10-CM | POA: Diagnosis not present

## 2020-08-12 DIAGNOSIS — A419 Sepsis, unspecified organism: Principal | ICD-10-CM | POA: Diagnosis present

## 2020-08-12 DIAGNOSIS — L03115 Cellulitis of right lower limb: Secondary | ICD-10-CM | POA: Diagnosis not present

## 2020-08-12 DIAGNOSIS — I872 Venous insufficiency (chronic) (peripheral): Secondary | ICD-10-CM | POA: Diagnosis not present

## 2020-08-12 DIAGNOSIS — M1990 Unspecified osteoarthritis, unspecified site: Secondary | ICD-10-CM | POA: Diagnosis not present

## 2020-08-12 DIAGNOSIS — E872 Acidosis, unspecified: Secondary | ICD-10-CM | POA: Diagnosis present

## 2020-08-12 DIAGNOSIS — S3992XA Unspecified injury of lower back, initial encounter: Secondary | ICD-10-CM | POA: Diagnosis not present

## 2020-08-12 DIAGNOSIS — Z741 Need for assistance with personal care: Secondary | ICD-10-CM | POA: Diagnosis not present

## 2020-08-12 DIAGNOSIS — M4316 Spondylolisthesis, lumbar region: Secondary | ICD-10-CM | POA: Diagnosis not present

## 2020-08-12 DIAGNOSIS — R42 Dizziness and giddiness: Secondary | ICD-10-CM | POA: Diagnosis not present

## 2020-08-12 DIAGNOSIS — D509 Iron deficiency anemia, unspecified: Secondary | ICD-10-CM | POA: Diagnosis not present

## 2020-08-12 DIAGNOSIS — E782 Mixed hyperlipidemia: Secondary | ICD-10-CM | POA: Diagnosis present

## 2020-08-12 DIAGNOSIS — Z7401 Bed confinement status: Secondary | ICD-10-CM | POA: Diagnosis not present

## 2020-08-12 DIAGNOSIS — J302 Other seasonal allergic rhinitis: Secondary | ICD-10-CM | POA: Diagnosis not present

## 2020-08-12 DIAGNOSIS — M19071 Primary osteoarthritis, right ankle and foot: Secondary | ICD-10-CM | POA: Diagnosis not present

## 2020-08-12 DIAGNOSIS — E876 Hypokalemia: Secondary | ICD-10-CM | POA: Diagnosis not present

## 2020-08-12 DIAGNOSIS — Z20822 Contact with and (suspected) exposure to covid-19: Secondary | ICD-10-CM | POA: Diagnosis present

## 2020-08-12 DIAGNOSIS — J3089 Other allergic rhinitis: Secondary | ICD-10-CM | POA: Diagnosis not present

## 2020-08-12 DIAGNOSIS — R519 Headache, unspecified: Secondary | ICD-10-CM

## 2020-08-12 DIAGNOSIS — I10 Essential (primary) hypertension: Secondary | ICD-10-CM

## 2020-08-12 DIAGNOSIS — R000 Tachycardia, unspecified: Secondary | ICD-10-CM | POA: Diagnosis not present

## 2020-08-12 DIAGNOSIS — R6520 Severe sepsis without septic shock: Secondary | ICD-10-CM | POA: Diagnosis present

## 2020-08-12 DIAGNOSIS — M79609 Pain in unspecified limb: Secondary | ICD-10-CM | POA: Diagnosis not present

## 2020-08-12 DIAGNOSIS — Z79899 Other long term (current) drug therapy: Secondary | ICD-10-CM

## 2020-08-12 DIAGNOSIS — K219 Gastro-esophageal reflux disease without esophagitis: Secondary | ICD-10-CM | POA: Diagnosis present

## 2020-08-12 DIAGNOSIS — M2550 Pain in unspecified joint: Secondary | ICD-10-CM | POA: Diagnosis not present

## 2020-08-12 DIAGNOSIS — M1611 Unilateral primary osteoarthritis, right hip: Secondary | ICD-10-CM | POA: Diagnosis not present

## 2020-08-12 DIAGNOSIS — I110 Hypertensive heart disease with heart failure: Secondary | ICD-10-CM | POA: Diagnosis present

## 2020-08-12 DIAGNOSIS — R404 Transient alteration of awareness: Secondary | ICD-10-CM | POA: Diagnosis not present

## 2020-08-12 DIAGNOSIS — S3992XD Unspecified injury of lower back, subsequent encounter: Secondary | ICD-10-CM | POA: Diagnosis not present

## 2020-08-12 DIAGNOSIS — L0390 Cellulitis, unspecified: Secondary | ICD-10-CM | POA: Diagnosis not present

## 2020-08-12 DIAGNOSIS — Z713 Dietary counseling and surveillance: Secondary | ICD-10-CM

## 2020-08-12 DIAGNOSIS — Z79891 Long term (current) use of opiate analgesic: Secondary | ICD-10-CM

## 2020-08-12 DIAGNOSIS — L538 Other specified erythematous conditions: Secondary | ICD-10-CM | POA: Diagnosis not present

## 2020-08-12 DIAGNOSIS — Z7951 Long term (current) use of inhaled steroids: Secondary | ICD-10-CM

## 2020-08-12 DIAGNOSIS — R410 Disorientation, unspecified: Secondary | ICD-10-CM | POA: Diagnosis not present

## 2020-08-12 DIAGNOSIS — Z96651 Presence of right artificial knee joint: Secondary | ICD-10-CM | POA: Diagnosis present

## 2020-08-12 DIAGNOSIS — D649 Anemia, unspecified: Secondary | ICD-10-CM | POA: Diagnosis not present

## 2020-08-12 DIAGNOSIS — R0689 Other abnormalities of breathing: Secondary | ICD-10-CM | POA: Diagnosis not present

## 2020-08-12 DIAGNOSIS — M79604 Pain in right leg: Secondary | ICD-10-CM | POA: Diagnosis not present

## 2020-08-12 DIAGNOSIS — M7989 Other specified soft tissue disorders: Secondary | ICD-10-CM | POA: Diagnosis not present

## 2020-08-12 DIAGNOSIS — I700 Atherosclerosis of aorta: Secondary | ICD-10-CM | POA: Diagnosis not present

## 2020-08-12 DIAGNOSIS — I959 Hypotension, unspecified: Secondary | ICD-10-CM | POA: Diagnosis not present

## 2020-08-12 DIAGNOSIS — Z91018 Allergy to other foods: Secondary | ICD-10-CM

## 2020-08-12 DIAGNOSIS — G4489 Other headache syndrome: Secondary | ICD-10-CM | POA: Diagnosis not present

## 2020-08-12 DIAGNOSIS — R2689 Other abnormalities of gait and mobility: Secondary | ICD-10-CM | POA: Diagnosis not present

## 2020-08-12 DIAGNOSIS — R600 Localized edema: Secondary | ICD-10-CM | POA: Diagnosis not present

## 2020-08-12 LAB — COMPREHENSIVE METABOLIC PANEL
ALT: 15 U/L (ref 0–44)
AST: 19 U/L (ref 15–41)
Albumin: 3.5 g/dL (ref 3.5–5.0)
Alkaline Phosphatase: 86 U/L (ref 38–126)
Anion gap: 12 (ref 5–15)
BUN: 23 mg/dL (ref 8–23)
CO2: 24 mmol/L (ref 22–32)
Calcium: 9.2 mg/dL (ref 8.9–10.3)
Chloride: 99 mmol/L (ref 98–111)
Creatinine, Ser: 1.09 mg/dL — ABNORMAL HIGH (ref 0.44–1.00)
GFR, Estimated: 54 mL/min — ABNORMAL LOW (ref >60)
Glucose, Bld: 121 mg/dL — ABNORMAL HIGH (ref 70–99)
Potassium: 4 mmol/L (ref 3.5–5.1)
Sodium: 135 mmol/L (ref 135–145)
Total Bilirubin: 1 mg/dL (ref 0.3–1.2)
Total Protein: 6.5 g/dL (ref 6.5–8.1)

## 2020-08-12 LAB — LACTIC ACID, PLASMA
Lactic Acid, Venous: 2.5 mmol/L (ref 0.5–1.9)
Lactic Acid, Venous: 1.9 mmol/L (ref 0.5–1.9)

## 2020-08-12 LAB — CBC WITH DIFFERENTIAL/PLATELET
Abs Immature Granulocytes: 0.45 10*3/uL — ABNORMAL HIGH (ref 0.00–0.07)
Basophils Absolute: 0.1 10*3/uL (ref 0.0–0.1)
Basophils Relative: 0 %
Eosinophils Absolute: 0 10*3/uL (ref 0.0–0.5)
Eosinophils Relative: 0 %
HCT: 24.1 % — ABNORMAL LOW (ref 36.0–46.0)
Hemoglobin: 6.8 g/dL — CL (ref 12.0–15.0)
Immature Granulocytes: 1 %
Lymphocytes Relative: 3 %
Lymphs Abs: 0.9 10*3/uL (ref 0.7–4.0)
MCH: 20.1 pg — ABNORMAL LOW (ref 26.0–34.0)
MCHC: 28.2 g/dL — ABNORMAL LOW (ref 30.0–36.0)
MCV: 71.3 fL — ABNORMAL LOW (ref 80.0–100.0)
Monocytes Absolute: 1.3 10*3/uL — ABNORMAL HIGH (ref 0.1–1.0)
Monocytes Relative: 4 %
Neutro Abs: 31.9 10*3/uL — ABNORMAL HIGH (ref 1.7–7.7)
Neutrophils Relative %: 92 %
Platelets: 542 10*3/uL — ABNORMAL HIGH (ref 150–400)
RBC: 3.38 MIL/uL — ABNORMAL LOW (ref 3.87–5.11)
RDW: 18.2 % — ABNORMAL HIGH (ref 11.5–15.5)
WBC: 34.6 10*3/uL — ABNORMAL HIGH (ref 4.0–10.5)
nRBC: 0.1 % (ref 0.0–0.2)

## 2020-08-12 LAB — TROPONIN I (HIGH SENSITIVITY)
Troponin I (High Sensitivity): 7 ng/L (ref <18)
Troponin I (High Sensitivity): 13 ng/L (ref <18)

## 2020-08-12 LAB — RESPIRATORY PANEL BY RT PCR (FLU A&B, COVID)
Influenza A by PCR: NEGATIVE
Influenza B by PCR: NEGATIVE
SARS Coronavirus 2 by RT PCR: NEGATIVE

## 2020-08-12 LAB — APTT: aPTT: 24 seconds (ref 24–36)

## 2020-08-12 LAB — PROTIME-INR
INR: 1.1 (ref 0.8–1.2)
Prothrombin Time: 14.2 seconds (ref 11.4–15.2)

## 2020-08-12 MED ORDER — SODIUM CHLORIDE 0.9 % IV SOLN
2.0000 g | INTRAVENOUS | Status: DC
  Administered 2020-08-12: 2 g via INTRAVENOUS
  Filled 2020-08-12: qty 20

## 2020-08-12 MED ORDER — ACETAMINOPHEN 500 MG PO TABS
1000.0000 mg | ORAL_TABLET | Freq: Once | ORAL | Status: AC
  Administered 2020-08-12: 1000 mg via ORAL
  Filled 2020-08-12: qty 2

## 2020-08-12 MED ORDER — LACTATED RINGERS IV BOLUS (SEPSIS)
2000.0000 mL | Freq: Once | INTRAVENOUS | Status: AC
  Administered 2020-08-12: 2000 mL via INTRAVENOUS

## 2020-08-12 NOTE — ED Triage Notes (Signed)
EMS arrival from home co dizziness and headache post fall around 12pm, denies injuries from fall. Reports increased dizziness and weakness, per EMS RLE warm and swollen, pt denies taking antibiotics, CGB  157, BP 158/72, temp 99.7. 20G L forearm, no meds in route.

## 2020-08-12 NOTE — ED Provider Notes (Signed)
[facility] Emergency Department Provider Note MRN:  [id number]  Arrival date & time: 08/12/20     Chief Complaint   Headache and Dizziness   History of Present Illness   [name] is a 74 y.o. year-old female with a history of hypertension presenting to the ED with chief complaint of headache and dizziness.  Patient is endorsing fatigue, dizziness today.  At times described as a room spinning sensation, at times described as a lightheadedness.  She had a fall today and since then is having a mild frontal headache, which she explains she has fairly often.  Does not think that she hit her head.  Denies blood thinners.  She is endorsing redness and swelling to the right leg for several days.  Denies fever or chills, no chest pain or shortness of breath, no abdominal pain.  Review of Systems  A complete 10 system review of systems was obtained and all systems are negative except as noted in the HPI and PMH.   Patient's Health History    Past Medical History:  Diagnosis Date  . Allergic rhinitis, cause unspecified   . Allergy    SEASONAL  . Anemia   . Cataract    Bilateral  . Chronic headaches   . Constipation   . Diverticulosis   . DJD (degenerative joint disease), lumbar   . GERD (gastroesophageal reflux disease)   . Hemorrhoids   . Hiatal hernia   . HTN (hypertension)   . Hyperlipidemia   . Obesity   . Osteoarthritis   . Tubular adenoma of colon     Past Surgical History:  Procedure Laterality Date  . ABLATION    . COLONOSCOPY    . 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 TUBAL LIGATION      Family History  Problem Relation Age of Onset  . Heart disease Mother   . Hypertension Mother   . Diabetes Brother   . Breast cancer Sister   . Colon cancer Neg Hx   . Esophageal cancer Neg Hx   . Rectal cancer  Neg Hx   . Stomach cancer Neg Hx     Social History   Socioeconomic History  . Marital status: Widowed    Spouse name: Not on file  . Number of children: 3  . Years of education: 33  . Highest education level: Not on file  Occupational History  . Occupation: retired    Fish farm manager: UNEMPLOYED  Tobacco Use  . Smoking status: Never Smoker  . Smokeless tobacco: Never Used  Vaping Use  . Vaping Use: Never used  Substance and Sexual Activity  . Alcohol use: No    Alcohol/week: 0.0 standard drinks  . Drug use: No  . Sexual activity: Not Currently    Birth control/protection: Post-menopausal  Other Topics Concern  . Not on file  Social History Narrative  . Not on file   Social Determinants of Health   Financial Resource Strain:   . Difficulty of Paying Living Expenses: Not on file  Food Insecurity:   . Worried About Charity fundraiser in the Last Year: Not on file  . Ran Out of Food in the Last Year: Not on file  Transportation Needs:   . Lack of Transportation (Medical): Not on file  . Lack of Transportation (Non-Medical): Not on file  Physical Activity:   .  Days of Exercise per Week: Not on file  . Minutes of Exercise per Session: Not on file  Stress:   . Feeling of Stress : Not on file  Social Connections:   . Frequency of Communication with Friends and Family: Not on file  . Frequency of Social Gatherings with Friends and Family: Not on file  . Attends Religious Services: Not on file  . Active Member of Clubs or Organizations: Not on file  . Attends Archivist Meetings: Not on file  . Marital Status: Not on file  Intimate Partner Violence:   . Fear of Current or Ex-Partner: Not on file  . Emotionally Abused: Not on file  . Physically Abused: Not on file  . Sexually Abused: Not on file     Physical Exam   Vitals:   08/12/20 2145 08/12/20 2215  BP: 140/87 128/73  Pulse: (!) 110 (!) 115  Resp: (!) 26 16  Temp:    SpO2: 100% 96%    CONSTITUTIONAL:  Well-appearing, NAD NEURO:  Alert and oriented x 3, no focal deficits EYES:  eyes equal and reactive ENT/NECK:  no LAD, no JVD CARDIO: Tachycardic rate, well-perfused, normal S1 and S2 PULM:  CTAB no wheezing or rhonchi GI/GU:  normal bowel sounds, non-distended, non-tender MSK/SPINE:  No gross deformities, no edema SKIN: Prominently edematous and erythematous right lower extremity with small 1 cm wound to the medial calf PSYCH:  Appropriate speech and behavior  *Additional and/or pertinent findings included in MDM below  Diagnostic and Interventional Summary    EKG Interpretation  Date/Time:  Monday August 12 2020 20:19:10 EDT Ventricular Rate:  108 PR Interval:    QRS Duration: 93 QT Interval:  334 QTC Calculation: 448 R Axis:   18 Text Interpretation: Sinus tachycardia Multiple ventricular premature complexes Aberrant complex RSR' in V1 or V2, right VCD or RVH Confirmed by Gerlene Fee [phone]) on 08/12/2020 8:46:30 PM      Labs Reviewed  LACTIC ACID, PLASMA - Abnormal; Notable for the following components:      Result Value   Lactic Acid, Venous 2.5 (*)    All other components within normal limits  COMPREHENSIVE METABOLIC PANEL - Abnormal; Notable for the following components:   Glucose, Bld 121 (*)    Creatinine, Ser 1.09 (*)    GFR, Estimated 54 (*)    All other components within normal limits  CBC WITH DIFFERENTIAL/PLATELET - Abnormal; Notable for the following components:   WBC 34.6 (*)    RBC 3.38 (*)    Hemoglobin 6.8 (*)    HCT 24.1 (*)    MCV 71.3 (*)    MCH 20.1 (*)    MCHC 28.2 (*)    RDW 18.2 (*)    Platelets 542 (*)    Neutro Abs 31.9 (*)    Monocytes Absolute 1.3 (*)    Abs Immature Granulocytes 0.45 (*)    All other components within normal limits  RESPIRATORY PANEL BY RT PCR (FLU A&B, COVID)  CULTURE, BLOOD (ROUTINE X 2)  CULTURE, BLOOD (ROUTINE X 2)  URINE CULTURE  LACTIC ACID, PLASMA  PROTIME-INR  APTT  URINALYSIS, ROUTINE W REFLEX  MICROSCOPIC  TROPONIN I (HIGH SENSITIVITY)  TROPONIN I (HIGH SENSITIVITY)    DG Tibia/Fibula Right  Final Result    DG Chest 1 View  Final Result      Medications  cefTRIAXone (ROCEPHIN) 2 g in sodium chloride 0.9 % 100 mL IVPB (0 g Intravenous Stopped 08/12/20 2154)  acetaminophen (TYLENOL) tablet 1,000 mg (1,000 mg Oral Not Given 08/12/20 2154)  lactated ringers bolus 2,000 mL (0 mLs Intravenous Stopped 08/12/20 2326)     Procedures  /  Critical Care .Critical Care Performed by: Maudie Flakes, MD Authorized by: Maudie Flakes, MD   Critical care provider statement:    Critical care time (minutes):  36   Critical care was necessary to treat or prevent imminent or life-threatening deterioration of the following conditions: Concern for sepsis, soft tissue origin.   Critical care was time spent personally by me on the following activities:  Discussions with consultants, evaluation of patient's response to treatment, examination of patient, ordering and performing treatments and interventions, ordering and review of laboratory studies, ordering and review of radiographic studies, pulse oximetry, re-evaluation of patient's condition, obtaining history from patient or surrogate and review of old charts    ED Course and Medical Decision Making  I have reviewed the triage vital signs, the nursing notes, and pertinent available records from the EMR.  Listed above are laboratory and imaging tests that I personally ordered, reviewed, and interpreted and then considered in my medical decision making (see below for details).  Patient arriving febrile and tachycardic with clear source of infection, initial concern for sepsis.  Providing 2 L IV fluids, empiric antibiotics.  Based on exam I doubt underlying abscess or significant deep space infection, will obtain screening plain film.  She is complaining of dull frontal headache but with no neurological deficits, no head trauma.  Suspect  headache and dizziness related to dehydration in the setting of systemic infection.     Labs reveal prominent leukocytosis, screening x-ray is normal.  Will admit to medicine.  Barth Kirks. Sedonia Small, [place] [email or web address]  Final Clinical Impressions(s) / ED Diagnoses     ICD-10-CM   1. Cellulitis of right leg  L03.115   2. Headache  R51.9 DG Chest 1 View    DG Chest 1 View  3. Sepsis, due to unspecified organism, unspecified whether acute organ dysfunction present [facility])  A41.9     ED Discharge Orders    None       Discharge Instructions Discussed with and Provided to Patient:   Discharge Instructions   None       Maudie Flakes, MD 08/12/20 2327

## 2020-08-13 ENCOUNTER — Encounter (HOSPITAL_COMMUNITY): Payer: Self-pay | Admitting: Internal Medicine

## 2020-08-13 ENCOUNTER — Observation Stay (HOSPITAL_COMMUNITY): Payer: Medicare Other

## 2020-08-13 ENCOUNTER — Encounter: Payer: Self-pay | Admitting: Internal Medicine

## 2020-08-13 DIAGNOSIS — Z66 Do not resuscitate: Secondary | ICD-10-CM | POA: Diagnosis present

## 2020-08-13 DIAGNOSIS — L0390 Cellulitis, unspecified: Secondary | ICD-10-CM | POA: Diagnosis not present

## 2020-08-13 DIAGNOSIS — I10 Essential (primary) hypertension: Secondary | ICD-10-CM

## 2020-08-13 DIAGNOSIS — M4316 Spondylolisthesis, lumbar region: Secondary | ICD-10-CM | POA: Diagnosis not present

## 2020-08-13 DIAGNOSIS — R6520 Severe sepsis without septic shock: Secondary | ICD-10-CM | POA: Diagnosis present

## 2020-08-13 DIAGNOSIS — L03115 Cellulitis of right lower limb: Secondary | ICD-10-CM | POA: Diagnosis present

## 2020-08-13 DIAGNOSIS — Z96651 Presence of right artificial knee joint: Secondary | ICD-10-CM | POA: Diagnosis present

## 2020-08-13 DIAGNOSIS — I959 Hypotension, unspecified: Secondary | ICD-10-CM | POA: Diagnosis not present

## 2020-08-13 DIAGNOSIS — Z6838 Body mass index (BMI) 38.0-38.9, adult: Secondary | ICD-10-CM | POA: Diagnosis not present

## 2020-08-13 DIAGNOSIS — M79609 Pain in unspecified limb: Secondary | ICD-10-CM

## 2020-08-13 DIAGNOSIS — K219 Gastro-esophageal reflux disease without esophagitis: Secondary | ICD-10-CM | POA: Diagnosis present

## 2020-08-13 DIAGNOSIS — N179 Acute kidney failure, unspecified: Secondary | ICD-10-CM | POA: Diagnosis not present

## 2020-08-13 DIAGNOSIS — R609 Edema, unspecified: Secondary | ICD-10-CM

## 2020-08-13 DIAGNOSIS — W19XXXA Unspecified fall, initial encounter: Secondary | ICD-10-CM

## 2020-08-13 DIAGNOSIS — Z86711 Personal history of pulmonary embolism: Secondary | ICD-10-CM | POA: Diagnosis not present

## 2020-08-13 DIAGNOSIS — E782 Mixed hyperlipidemia: Secondary | ICD-10-CM

## 2020-08-13 DIAGNOSIS — D509 Iron deficiency anemia, unspecified: Secondary | ICD-10-CM | POA: Diagnosis present

## 2020-08-13 DIAGNOSIS — R238 Other skin changes: Secondary | ICD-10-CM | POA: Diagnosis not present

## 2020-08-13 DIAGNOSIS — S3992XA Unspecified injury of lower back, initial encounter: Secondary | ICD-10-CM

## 2020-08-13 DIAGNOSIS — R42 Dizziness and giddiness: Secondary | ICD-10-CM | POA: Diagnosis not present

## 2020-08-13 DIAGNOSIS — E872 Acidosis, unspecified: Secondary | ICD-10-CM | POA: Diagnosis present

## 2020-08-13 DIAGNOSIS — L538 Other specified erythematous conditions: Secondary | ICD-10-CM | POA: Diagnosis not present

## 2020-08-13 DIAGNOSIS — E876 Hypokalemia: Secondary | ICD-10-CM | POA: Diagnosis not present

## 2020-08-13 DIAGNOSIS — Z20822 Contact with and (suspected) exposure to covid-19: Secondary | ICD-10-CM | POA: Diagnosis present

## 2020-08-13 DIAGNOSIS — A419 Sepsis, unspecified organism: Secondary | ICD-10-CM | POA: Diagnosis present

## 2020-08-13 DIAGNOSIS — I5032 Chronic diastolic (congestive) heart failure: Secondary | ICD-10-CM | POA: Diagnosis present

## 2020-08-13 DIAGNOSIS — I700 Atherosclerosis of aorta: Secondary | ICD-10-CM | POA: Diagnosis not present

## 2020-08-13 DIAGNOSIS — J302 Other seasonal allergic rhinitis: Secondary | ICD-10-CM | POA: Diagnosis present

## 2020-08-13 DIAGNOSIS — M549 Dorsalgia, unspecified: Secondary | ICD-10-CM | POA: Diagnosis present

## 2020-08-13 DIAGNOSIS — M7989 Other specified soft tissue disorders: Secondary | ICD-10-CM | POA: Diagnosis not present

## 2020-08-13 DIAGNOSIS — I110 Hypertensive heart disease with heart failure: Secondary | ICD-10-CM | POA: Diagnosis present

## 2020-08-13 DIAGNOSIS — E869 Volume depletion, unspecified: Secondary | ICD-10-CM | POA: Diagnosis not present

## 2020-08-13 DIAGNOSIS — Z8249 Family history of ischemic heart disease and other diseases of the circulatory system: Secondary | ICD-10-CM | POA: Diagnosis not present

## 2020-08-13 DIAGNOSIS — I872 Venous insufficiency (chronic) (peripheral): Secondary | ICD-10-CM | POA: Diagnosis present

## 2020-08-13 LAB — PROTIME-INR
INR: 1.2 (ref 0.8–1.2)
Prothrombin Time: 14.7 seconds (ref 11.4–15.2)

## 2020-08-13 LAB — CBC
HCT: 24.6 % — ABNORMAL LOW (ref 36.0–46.0)
HCT: 24.5 % — ABNORMAL LOW (ref 36.0–46.0)
Hemoglobin: 7.4 g/dL — ABNORMAL LOW (ref 12.0–15.0)
Hemoglobin: 7.1 g/dL — ABNORMAL LOW (ref 12.0–15.0)
MCH: 22.3 pg — ABNORMAL LOW (ref 26.0–34.0)
MCH: 21.5 pg — ABNORMAL LOW (ref 26.0–34.0)
MCHC: 30.1 g/dL (ref 30.0–36.0)
MCHC: 29 g/dL — ABNORMAL LOW (ref 30.0–36.0)
MCV: 74.1 fL — ABNORMAL LOW (ref 80.0–100.0)
MCV: 74 fL — ABNORMAL LOW (ref 80.0–100.0)
Platelets: 458 10*3/uL — ABNORMAL HIGH (ref 150–400)
Platelets: 399 10*3/uL (ref 150–400)
RBC: 3.32 MIL/uL — ABNORMAL LOW (ref 3.87–5.11)
RBC: 3.31 MIL/uL — ABNORMAL LOW (ref 3.87–5.11)
RDW: 20.2 % — ABNORMAL HIGH (ref 11.5–15.5)
RDW: 19.6 % — ABNORMAL HIGH (ref 11.5–15.5)
WBC: 29.6 10*3/uL — ABNORMAL HIGH (ref 4.0–10.5)
WBC: 28.1 10*3/uL — ABNORMAL HIGH (ref 4.0–10.5)
nRBC: 0 % (ref 0.0–0.2)
nRBC: 0 % (ref 0.0–0.2)

## 2020-08-13 LAB — IRON AND TIBC
Iron: 31 ug/dL (ref 28–170)
Saturation Ratios: 7 % — ABNORMAL LOW (ref 10.4–31.8)
TIBC: 454 ug/dL — ABNORMAL HIGH (ref 250–450)
UIBC: 423 ug/dL

## 2020-08-13 LAB — RETICULOCYTES
Immature Retic Fract: 29.8 % — ABNORMAL HIGH (ref 2.3–15.9)
RBC.: 3.29 MIL/uL — ABNORMAL LOW (ref 3.87–5.11)
Retic Count, Absolute: 49.3 10*3/uL (ref 19.0–186.0)
Retic Ct Pct: 1.5 % (ref 0.4–3.1)

## 2020-08-13 LAB — COMPREHENSIVE METABOLIC PANEL
ALT: 15 U/L (ref 0–44)
AST: 18 U/L (ref 15–41)
Albumin: 2.8 g/dL — ABNORMAL LOW (ref 3.5–5.0)
Alkaline Phosphatase: 82 U/L (ref 38–126)
Anion gap: 12 (ref 5–15)
BUN: 16 mg/dL (ref 8–23)
CO2: 22 mmol/L (ref 22–32)
Calcium: 8.7 mg/dL — ABNORMAL LOW (ref 8.9–10.3)
Chloride: 101 mmol/L (ref 98–111)
Creatinine, Ser: 1.01 mg/dL — ABNORMAL HIGH (ref 0.44–1.00)
GFR, Estimated: 59 mL/min — ABNORMAL LOW (ref >60)
Glucose, Bld: 104 mg/dL — ABNORMAL HIGH (ref 70–99)
Potassium: 3.7 mmol/L (ref 3.5–5.1)
Sodium: 135 mmol/L (ref 135–145)
Total Bilirubin: 1.3 mg/dL — ABNORMAL HIGH (ref 0.3–1.2)
Total Protein: 5.5 g/dL — ABNORMAL LOW (ref 6.5–8.1)

## 2020-08-13 LAB — URINALYSIS, ROUTINE W REFLEX MICROSCOPIC
Bilirubin Urine: NEGATIVE
Glucose, UA: NEGATIVE mg/dL
Hgb urine dipstick: NEGATIVE
Ketones, ur: NEGATIVE mg/dL
Leukocytes,Ua: NEGATIVE
Nitrite: NEGATIVE
Protein, ur: NEGATIVE mg/dL
Specific Gravity, Urine: 1.014 (ref 1.005–1.030)
pH: 7 (ref 5.0–8.0)

## 2020-08-13 LAB — PREPARE RBC (CROSSMATCH)

## 2020-08-13 LAB — POC OCCULT BLOOD, ED: Fecal Occult Bld: NEGATIVE

## 2020-08-13 LAB — VITAMIN B12: Vitamin B-12: 205 pg/mL (ref 180–914)

## 2020-08-13 LAB — FOLATE: Folate: 13 ng/mL (ref >5.9)

## 2020-08-13 LAB — CORTISOL-AM, BLOOD: Cortisol - AM: 14.1 ug/dL (ref 6.7–22.6)

## 2020-08-13 LAB — PROCALCITONIN: Procalcitonin: 0.6 ng/mL

## 2020-08-13 LAB — MAGNESIUM: Magnesium: 1.8 mg/dL (ref 1.7–2.4)

## 2020-08-13 MED ORDER — ONDANSETRON HCL 4 MG/2ML IJ SOLN: 4.0000 mg | Freq: Four times a day (QID) | INTRAMUSCULAR | Status: DC | PRN

## 2020-08-13 MED ORDER — ACETAMINOPHEN 325 MG PO TABS
650.0000 mg | ORAL_TABLET | Freq: Four times a day (QID) | ORAL | Status: DC | PRN
  Administered 2020-08-14 – 2020-08-23 (×6): 650 mg via ORAL
  Filled 2020-08-13 (×6): qty 2

## 2020-08-13 MED ORDER — LORATADINE 10 MG PO TABS: 10.0000 mg | ORAL_TABLET | Freq: Every day | ORAL | Status: DC | PRN

## 2020-08-13 MED ORDER — SODIUM CHLORIDE 0.9 % IV SOLN
10.0000 mL/h | Freq: Once | INTRAVENOUS | Status: AC
  Administered 2020-08-13: 10 mL/h via INTRAVENOUS

## 2020-08-13 MED ORDER — AZELASTINE HCL 0.1 % NA SOLN
1.0000 | Freq: Every day | NASAL | Status: DC | PRN
  Administered 2020-08-19: 1 via NASAL
  Filled 2020-08-13: qty 30

## 2020-08-13 MED ORDER — LOSARTAN POTASSIUM 50 MG PO TABS
100.0000 mg | ORAL_TABLET | Freq: Every day | ORAL | Status: DC
  Administered 2020-08-13 – 2020-08-17 (×4): 100 mg via ORAL
  Filled 2020-08-13 (×4): qty 2

## 2020-08-13 MED ORDER — POLYVINYL ALCOHOL 1.4 % OP SOLN: Freq: Three times a day (TID) | OPHTHALMIC | Status: DC | PRN

## 2020-08-13 MED ORDER — METOPROLOL TARTRATE 50 MG PO TABS
50.0000 mg | ORAL_TABLET | Freq: Two times a day (BID) | ORAL | Status: DC
  Administered 2020-08-13 – 2020-08-17 (×8): 50 mg via ORAL
  Filled 2020-08-13 (×5): qty 1
  Filled 2020-08-13: qty 2
  Filled 2020-08-13 (×2): qty 1

## 2020-08-13 MED ORDER — TIZANIDINE HCL 2 MG PO TABS
2.0000 mg | ORAL_TABLET | Freq: Four times a day (QID) | ORAL | Status: DC | PRN
  Administered 2020-08-16: 2 mg via ORAL
  Filled 2020-08-13 (×2): qty 1

## 2020-08-13 MED ORDER — DILTIAZEM HCL ER COATED BEADS 240 MG PO CP24
240.0000 mg | ORAL_CAPSULE | Freq: Every day | ORAL | Status: DC
  Administered 2020-08-13 – 2020-08-17 (×5): 240 mg via ORAL
  Filled 2020-08-13 (×5): qty 1

## 2020-08-13 MED ORDER — ACETAMINOPHEN 650 MG RE SUPP: 650.0000 mg | Freq: Four times a day (QID) | RECTAL | Status: DC | PRN

## 2020-08-13 MED ORDER — POLYETHYLENE GLYCOL 3350 17 G PO PACK: 17.0000 g | PACK | Freq: Every day | ORAL | Status: DC | PRN

## 2020-08-13 MED ORDER — PREDNISOL ACE-MOXIFLOX-BROMFEN 1-0.5-0.075 % OP SUSP
1.0000 [drp] | Freq: Four times a day (QID) | OPHTHALMIC | Status: DC
  Administered 2020-08-18: 1 [drp] via OPHTHALMIC

## 2020-08-13 MED ORDER — ENOXAPARIN SODIUM 40 MG/0.4ML ~~LOC~~ SOLN: 40.0000 mg | SUBCUTANEOUS | Status: DC

## 2020-08-13 MED ORDER — TRAMADOL HCL 50 MG PO TABS
50.0000 mg | ORAL_TABLET | Freq: Four times a day (QID) | ORAL | Status: DC | PRN
  Administered 2020-08-13 – 2020-08-15 (×5): 100 mg via ORAL
  Administered 2020-08-15 – 2020-08-17 (×3): 50 mg via ORAL
  Administered 2020-08-18 – 2020-08-22 (×7): 100 mg via ORAL
  Filled 2020-08-13 (×5): qty 2
  Filled 2020-08-13: qty 1
  Filled 2020-08-13 (×4): qty 2
  Filled 2020-08-13: qty 1
  Filled 2020-08-13 (×3): qty 2
  Filled 2020-08-13: qty 1

## 2020-08-13 MED ORDER — VANCOMYCIN HCL 750 MG/150ML IV SOLN
750.0000 mg | Freq: Two times a day (BID) | INTRAVENOUS | Status: DC
  Administered 2020-08-13 – 2020-08-17 (×8): 750 mg via INTRAVENOUS
  Filled 2020-08-13 (×10): qty 150

## 2020-08-13 MED ORDER — DULOXETINE HCL 20 MG PO CPEP
40.0000 mg | ORAL_CAPSULE | Freq: Every day | ORAL | Status: DC
  Administered 2020-08-13 – 2020-08-23 (×11): 40 mg via ORAL
  Filled 2020-08-13 (×12): qty 2

## 2020-08-13 MED ORDER — VANCOMYCIN HCL IN DEXTROSE 1-5 GM/200ML-% IV SOLN
1000.0000 mg | Freq: Once | INTRAVENOUS | Status: AC
  Administered 2020-08-13: 1000 mg via INTRAVENOUS
  Filled 2020-08-13: qty 200

## 2020-08-13 MED ORDER — LACTATED RINGERS IV SOLN: INTRAVENOUS | Status: AC

## 2020-08-13 MED ORDER — ATORVASTATIN CALCIUM 10 MG PO TABS
20.0000 mg | ORAL_TABLET | Freq: Every day | ORAL | Status: DC
  Administered 2020-08-13 – 2020-08-23 (×11): 20 mg via ORAL
  Filled 2020-08-13 (×12): qty 2

## 2020-08-13 MED ORDER — PANTOPRAZOLE SODIUM 40 MG IV SOLR
40.0000 mg | Freq: Two times a day (BID) | INTRAVENOUS | Status: DC
  Administered 2020-08-13 – 2020-08-14 (×4): 40 mg via INTRAVENOUS
  Filled 2020-08-13 (×4): qty 40

## 2020-08-13 MED ORDER — PANTOPRAZOLE SODIUM 40 MG PO TBEC: 40.0000 mg | DELAYED_RELEASE_TABLET | Freq: Every day | ORAL | Status: DC

## 2020-08-13 MED ORDER — SODIUM CHLORIDE 0.9% IV SOLUTION: Freq: Once | INTRAVENOUS | Status: AC

## 2020-08-13 MED ORDER — HYDROXYZINE HCL 10 MG PO TABS
10.0000 mg | ORAL_TABLET | Freq: Three times a day (TID) | ORAL | Status: DC | PRN
  Administered 2020-08-14 – 2020-08-23 (×13): 10 mg via ORAL
  Filled 2020-08-13 (×15): qty 1

## 2020-08-13 MED ORDER — GABAPENTIN 100 MG PO CAPS
200.0000 mg | ORAL_CAPSULE | Freq: Every day | ORAL | Status: DC
  Administered 2020-08-13 – 2020-08-23 (×12): 200 mg via ORAL
  Filled 2020-08-13 (×12): qty 2

## 2020-08-13 MED ORDER — ONDANSETRON HCL 4 MG PO TABS: 4.0000 mg | ORAL_TABLET | Freq: Four times a day (QID) | ORAL | Status: DC | PRN

## 2020-08-13 NOTE — Assessment & Plan Note (Signed)
stable overall by history and exam, recent data reviewed with pt, and pt to continue medical treatment as before,  to f/u any worsening symptoms or concerns  

## 2020-08-13 NOTE — Assessment & Plan Note (Signed)
For otc melatonin asd,  to f/u any worsening symptoms or concerns

## 2020-08-13 NOTE — Progress Notes (Signed)
Pharmacy Antibiotic Note  [name] is a 74 y.o. female admitted on 08/12/2020 with cellulitis.  Pharmacy has been consulted for Vancomycin dosing. WBC is elevated. CrCl ~50.   Plan: Vancomycin 750 mg IV q12h Trend WBC, temp, renal function  F/U infectious work-up Drug levels as indicated   Height: 5\' 3"  (160 cm) Weight: 98.4 kg (216 lb 14.9 oz) IBW/kg (Calculated) : 52.4  Temp (24hrs), Avg:100.4 F (38 C), Min:99.3 F (37.4 C), Max:101.4 F (38.6 C)  Recent Labs  Lab 08/07/20 1132 08/12/20 2014 08/12/20 2214  WBC 12.9* 34.6*  --   CREATININE 0.99 1.09*  --   LATICACIDVEN  --  2.5* 1.9    Estimated Creatinine Clearance: 51.4 mL/min (A) (by C-G formula based on SCr of 1.09 mg/dL (H)).    Allergies  Allergen Reactions  . 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 Sulfonamide Derivatives Itching and Rash   Narda Bonds, PharmD, BCPS Clinical Pharmacist Phone: [phone]

## 2020-08-13 NOTE — Assessment & Plan Note (Signed)
For f/u lipids on new statin

## 2020-08-13 NOTE — Assessment & Plan Note (Addendum)
Ok for d/c eliquis  I spent 41 minutes in preparing to see the patient by review of recent labs, imaging and procedures, obtaining and reviewing separately obtained history, communicating with the patient and family or caregiver, ordering medications, tests or procedures, and documenting clinical information in the EHR including the differential Dx, treatment, and any further evaluation and other management of PE, skin lesion , insomnia, aortic atherosclerosis, hld, hyperglycemia, itching,

## 2020-08-13 NOTE — ED Notes (Signed)
Lunch Tray Ordered @ 1014. 

## 2020-08-13 NOTE — Assessment & Plan Note (Signed)
Cont statin

## 2020-08-13 NOTE — H&P (Signed)
History and Physical    [name] ZOX:[id number] DOB: 1946/09/13 DOA: 08/12/2020  PCP: Biagio Borg, MD  Patient coming from:  Dizziness and Headache   Chief Complaint:  Chief Complaint  Patient presents with  . Headache  . Dizziness     HPI:    74 year old female with past medical history of gastroesophageal reflux disease, hypertension, hyperlipidemia, osteoarthritis, chronic venous insufficiency and pulmonary emboli diagnosed in March 2021 (Xarelto ended 07/2020) who presented to [facility] emergency department with complaints of headaches and lightheadedness.  Patient explains that 1-2 weeks ago, a blister on the anterior surface of her right leg had burst.  In the days following, patient began to develop some drainage from this area with surrounding redness and swelling.  Patient states there is only mild associated pain which caused her to not mind it much.  Patient explains that approximately 4 days ago he began to develop generalized weakness and lethargy.  This was initially mild in intensity but progressively worsened over the next several days.  As patient symptoms worsened she exhibited associated poor appetite, progressively worsening lightheadedness and dizziness.  Patient symptoms continue to worsen until she eventually presented to [facility] emergency department for evaluation.  Upon evaluation in the emergency department patient was found to have multiple SIRS criteria including fever, tachycardia, tachypnea and leukocytosis with concurrent lactic acidosis concerning for sepsis secondary to a developing right lower extremity cellulitis.  Patient was initiated on intravenous antibiotic therapy with ceftriaxone and 2 L of normal saline were administered.  The hospitalist group was then called to assess the patient for admission to the hospital.  Patient was also noted to have substantial anemia upon evaluation in the emergency department.   Rectal exam was performed which was negative for any gross blood or stool in the rectal vault.  Review of Systems:   Review of Systems  Constitutional: Positive for fever and malaise/fatigue.  Musculoskeletal: Positive for back pain.  Neurological: Positive for dizziness and weakness.    Past Medical History:  Diagnosis Date  . Allergic rhinitis, cause unspecified   . Allergy    SEASONAL  . Anemia   . Cataract    Bilateral  . Chronic headaches   . Constipation   . Diverticulosis   . DJD (degenerative joint disease), lumbar   . GERD (gastroesophageal reflux disease)   . Hemorrhoids   . Hiatal hernia   . HTN (hypertension)   . Hyperlipidemia   . Obesity   . Osteoarthritis   . Tubular adenoma of colon     Past Surgical History:  Procedure Laterality Date  . ABLATION    . COLONOSCOPY    . 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 TUBAL LIGATION       reports that she has never smoked. She has never used smokeless tobacco. She reports that she does not drink alcohol and does not use drugs.  Allergies  Allergen Reactions  . 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 Sulfonamide Derivatives Itching and Rash    Family History  Problem Relation Age of Onset  . Heart disease Mother   . Hypertension Mother   . Diabetes Brother   . Breast cancer Sister   . Colon cancer Neg Hx   . Esophageal  cancer Neg Hx   . Rectal cancer Neg Hx   . Stomach cancer Neg Hx      Prior to Admission medications   Medication Sig Start Date End Date Taking? Authorizing Provider  atorvastatin (LIPITOR) 20 MG tablet TAKE 1 TABLET BY MOUTH  DAILY 06/26/20  Yes Biagio Borg, MD  azelastine (ASTELIN) 0.1 % nasal spray Place 1 spray into both nostrils 2 (two) times daily. Use in each  nostril as directed Patient taking differently: Place 1 spray into both nostrils daily as needed for allergies. Use in each nostril as directed  01/30/20  Yes Young, Tarri Fuller D, MD  cetirizine (ZYRTEC) 10 MG tablet Take 1 tablet (10 mg total) by mouth daily. As needed Patient taking differently: Take 10 mg by mouth daily as needed for allergies. As needed 04/24/13  Yes Biagio Borg, MD  DILT-XR 240 MG 24 hr capsule TAKE 1 CAPSULE BY MOUTH  DAILY 05/06/20  Yes Biagio Borg, MD  DULoxetine (CYMBALTA) 20 MG capsule Take 2 capsules (40 mg total) by mouth daily. 10/16/19  Yes Biagio Borg, MD  esomeprazole (NEXIUM) 40 MG capsule TAKE 1 CAPSULE BY MOUTH  DAILY 02/06/20  Yes Willia Craze, NP  furosemide (LASIX) 80 MG tablet Take 1 tablet (80 mg total) by mouth 2 (two) times daily. 01/11/20  Yes Biagio Borg, MD  gabapentin (NEURONTIN) 100 MG capsule TAKE 2 CAPSULES(200 MG) BY MOUTH AT BEDTIME 03/07/20  Yes Biagio Borg, MD  hydrOXYzine (ATARAX/VISTARIL) 10 MG tablet Take 1 tablet (10 mg total) by mouth every 8 (eight) hours as needed for itching. 08/07/20  Yes Biagio Borg, MD  losartan (COZAAR) 100 MG tablet TAKE 1 TABLET BY MOUTH  DAILY 04/02/20  Yes Biagio Borg, MD  metoprolol tartrate (LOPRESSOR) 50 MG tablet TAKE 1 TABLET BY MOUTH  TWICE DAILY 07/29/20  Yes Biagio Borg, MD  Polyethyl Glycol-Propyl Glycol (SYSTANE OP) Place 1 drop into both eyes 3 (three) times daily as needed (dry eyes).   Yes [provider]  Polyethylene Glycol 3350 (MIRALAX PO) Take 17 g by mouth daily as needed (constipation).    Yes [provider]  potassium chloride (KLOR-CON) 10 MEQ tablet TAKE 1 TABLET(10 MEQ) BY MOUTH DAILY Patient taking differently: Take 10 mEq by mouth daily.  07/09/20  Yes Biagio Borg, MD  Prednisol Ace-Moxiflox-Bromfen 1-0.5-0.075 % SUSP Place 1 drop into the right eye 4 (four) times daily. 07/02/20  Yes [provider]  tiZANidine (ZANAFLEX) 2 MG tablet Take 2 mg by  mouth every 6 (six) hours as needed for muscle spasms.   Yes [provider]  traMADol (ULTRAM) 50 MG tablet Take 50-100 mg by mouth every 6 (six) hours as needed for moderate pain.  12/15/17  Yes [provider]  atorvastatin (LIPITOR) 40 MG tablet Take 1 tablet (40 mg total) by mouth daily. Patient not taking: Reported on 08/12/2020 02/06/20   Biagio Borg, MD  FLUZONE HIGH-DOSE QUADRIVALENT 0.7 ML SUSY  07/07/19   [provider]  triamcinolone cream (KENALOG) 0.1 % Apply 1 application topically 2 (two) times daily. Patient not taking: Reported on 08/12/2020 07/12/20   Marrian Salvage, FNP    Physical Exam: Vitals:   08/13/20 0500 08/13/20 0515 08/13/20 0615 08/13/20 0645  BP: 121/67 120/67 129/71 137/62  Pulse: (!) 106 (!) 107 (!) 109 (!) 109  Resp: (!) 26 (!) 23 18 18   Temp:  TempSrc:      SpO2: 100% 99% 100% 99%  Weight:      Height:         Constitutional: Acute alert and oriented x3, no associated distress.  Patient is obese. Skin: Extensive hyperemia noted of the right lower extremity surrounding a shallow ulceration measuring approximately 2 cm in diameter on the anterior medial surface of the right leg.  Ulcer is draining small amount of purulent drainage.  No other rashes or lesions seen.  Notable poor skin turgor. Eyes: Pupils are equally reactive to light.  No evidence of scleral icterus or conjunctival pallor.  ENMT: Dry mucous membranes noted.  Posterior pharynx clear of any exudate or lesions.   Neck: normal, supple, no masses, no thyromegaly.  No evidence of jugular venous distension.   Respiratory: clear to auscultation bilaterally, no wheezing, no crackles. Normal respiratory effort. No accessory muscle use.  Cardiovascular: Tachycardic and regular, no murmurs / rubs / gallops. No extremity edema. 2+ pedal pulses. No carotid bruits.  Chest:   Nontender without crepitus or deformity.   Back:   Notable midline tenderness of the  lumbar spine without evidence of crepitus or deformity.. Abdomen: Abdomen is soft and nontender.  No evidence of intra-abdominal masses.  Positive bowel sounds noted in all quadrants.   Musculoskeletal: Notable tenderness and warmth of the distal right lower extremity with skin examination findings as noted above. . Good ROM, no contractures. Normal muscle tone.  Neurologic: CN 2-12 grossly intact. Sensation intact.  Patient moving all 4 extremities spontaneously.  Patient is following all commands.  Patient is responsive to verbal stimuli.   Psychiatric: Patient exhibits normal mood with appropriate affect.  Patient seems to possess insight as to their current situation.     Labs on Admission: I have personally reviewed following labs and imaging studies -   CBC: Recent Labs  Lab 08/07/20 1132 08/12/20 2014 08/13/20 0435  WBC 12.9* 34.6* 29.6*  NEUTROABS 8.3* 31.9*  --   HGB 7.1 Repeated and verified X2.* 6.8* 7.4*  HCT 23.1 Repeated and verified X2.* 24.1* 24.6*  MCV 66.1* 71.3* 74.1*  PLT 569.0* 542* 993*   Basic Metabolic Panel: Recent Labs  Lab 08/07/20 1132 08/12/20 2014 08/13/20 0435  NA 137 135 135  K 3.8 4.0 3.7  CL 99 99 101  CO2 31 24 22   GLUCOSE 102* 121* 104*  BUN 27* 23 16  CREATININE 0.99 1.09* 1.01*  CALCIUM 9.6 9.2 8.7*  MG  --   --  1.8   GFR: Estimated Creatinine Clearance: 55.4 mL/min (A) (by C-G formula based on SCr of 1.01 mg/dL (H)). Liver Function Tests: Recent Labs  Lab 08/07/20 1132 08/12/20 2014 08/13/20 0435  AST 12 19 18   ALT 15 15 15   ALKPHOS 92 86 82  BILITOT 0.4 1.0 1.3*  PROT 6.7 6.5 5.5*  ALBUMIN 4.0 3.5 2.8*   No results for input(s): LIPASE, AMYLASE in the last 168 hours. No results for input(s): AMMONIA in the last 168 hours. Coagulation Profile: Recent Labs  Lab 08/12/20 2014 08/13/20 0435  INR 1.1 1.2   Cardiac Enzymes: No results for input(s): CKTOTAL, CKMB, CKMBINDEX, TROPONINI in the last 168 hours. BNP (last 3  results) Recent Labs    12/11/19 1105  PROBNP 26.0   HbA1C: No results for input(s): HGBA1C in the last 72 hours. CBG: No results for input(s): GLUCAP in the last 168 hours. Lipid Profile: No results for input(s): CHOL, HDL, LDLCALC, TRIG, CHOLHDL, LDLDIRECT  in the last 72 hours. Thyroid Function Tests: No results for input(s): TSH, T4TOTAL, FREET4, T3FREE, THYROIDAB in the last 72 hours. Anemia Panel: Recent Labs    08/13/20 0435  VITAMINB12 205  FOLATE 13.0  TIBC 454*  IRON 31  RETICCTPCT 1.5   Urine analysis:    Component Value Date/Time   COLORURINE YELLOW 08/13/2020 0012   APPEARANCEUR CLEAR 08/13/2020 0012   LABSPEC 1.014 08/13/2020 0012   PHURINE 7.0 08/13/2020 0012   GLUCOSEU NEGATIVE 08/13/2020 0012   GLUCOSEU NEGATIVE 02/06/2020 1039   HGBUR NEGATIVE 08/13/2020 0012   BILIRUBINUR NEGATIVE 08/13/2020 0012   BILIRUBINUR neg 03/09/2017 1759   KETONESUR NEGATIVE 08/13/2020 0012   PROTEINUR NEGATIVE 08/13/2020 0012   UROBILINOGEN 0.2 02/06/2020 1039   NITRITE NEGATIVE 08/13/2020 0012   LEUKOCYTESUR NEGATIVE 08/13/2020 0012    Radiological Exams on Admission - Personally Reviewed: DG Chest 1 View  Result Date: 08/12/2020 CLINICAL DATA:  Headache post fall EXAM: CHEST  1 VIEW COMPARISON:  04/12/2019 FINDINGS: The heart size and mediastinal contours are within normal limits. Aortic atherosclerosis. Both lungs are clear. The visualized skeletal structures are unremarkable. IMPRESSION: No active disease. Electronically Signed   By: Donavan Foil M.D.   On: 08/12/2020 21:12   DG Lumbar Spine 2-3 Views  Result Date: 08/13/2020 CLINICAL DATA:  Fall.  Dizziness. EXAM: LUMBAR SPINE - 2-3 VIEW COMPARISON:  Lumbar spine radiographs 11/10/2017 FINDINGS: Grade 1 anterolisthesis is present at L3-4 at L4-5 without significant interval change. Remote superior endplate fracture at L1 is noted. Vertebral body heights are otherwise normal. No acute fractures are present. Vascular  calcifications are noted in the aorta. Calcified uterine fibroid is stable. Visualized pelvis is unremarkable. IMPRESSION: 1. No acute abnormality or significant interval change. 2. Stable grade 1 anterolisthesis at L3-4 and L4-5. 3. Remote superior endplate fracture at L1. Electronically Signed   By: San Morelle M.D.   On: 08/13/2020 05:50   DG Tibia/Fibula Right  Result Date: 08/12/2020 CLINICAL DATA:  Right leg pain after fall EXAM: RIGHT TIBIA AND FIBULA - 2 VIEW COMPARISON:  05/22/2014 FINDINGS: Right knee replacement. No acute displaced fracture or malalignment. Diffuse subcutaneous edema IMPRESSION: No acute osseous abnormality. Electronically Signed   By: Donavan Foil M.D.   On: 08/12/2020 21:13    EKG: Personally reviewed.  Rhythm is sinus tachycardia with heart rate of 108 bpm.  No dynamic ST segment changes appreciated.  Assessment/Plan Principal Problem:   Sepsis due to cellulitis (HCC)   Notable multiple SIRS criteria including tachycardia, tachypnea, fever and leukocytosis with concurrent lactic acidosis concerning for sepsis in the setting of right lower extremity cellulitis  We will transition patient to intravenous vancomycin considering purulent drainage from ulceration on the anterior surface of the right lower extremity  Hydrating patient with intravenous isotonic fluids  Local wound care with daily dressing changes.  Wound care consultation is also in place.  Wound has been cultured.  Blood cultures have been obtained.  Right lower extremity ultrasound has been ordered to evaluate for any evidence of underlying DVT considering patient just discontinued her Xarelto within the past 2 weeks.  As needed analgesics for associated pain  De-escalation antibiotics based on cultures.  Active Problems:   Cellulitis of right lower extremity   Etiology of cellulitis is likely rupture of blister on the anterior surface of the right lower extremity that formed due  to patient's known history of chronic venous stasis.  Please see assessment and plan above  Microcytic anemia   Patient presenting with substantial anemia and hemoglobin of 6.8  No clinical evidence of bleeding.  No heavy bruising.  Rectal exam performed in the emergency department revealed no gross blood and no stool in the rectal vault to perform Hemoccult.  Bilirubin only minimally elevated at 1.3 with normal INR making hemolytic anemia or DIC unlikely.  We will start by transfusing patient 1 unit of packed red blood cells, monitor hemoglobin and hematocrit with serial CBCs  Placing patient on intravenous PPI for now  Obtaining vitamin B12, folate, iron panel  We will treat patient sepsis, and based on whether anemia persists/worsens will consider GI consultation.    Lactic acidosis  Lactic acidosis likely secondary to sepsis with concurrent volume depletion Hydrate patient intravenous isotonic fluids while concurrently treating underlying infection Monitoring serial lactic acid levels to ensure downtrending and resolution.    Essential hypertension   Continue home regimen of antihypertensives with exception of diuretics which will be temporarily held.  Monitoring for episodic hypotension considering concurrent sepsis.    Injury of back due to fall   Patient complains of several week history of low back pain after falling at home  This seems to be unrelated from patient's current presentation with sepsis and cellulitis as the patient's back pain began well before her infection did.  Due to persisting substantial symptoms with lower back tenderness on examination will obtain plain films of the lumbar spine.    Mixed hyperlipidemia  Continue home regimen of statin therapy    Code Status: DNR Family Communication: deferred   Status is: Observation  The patient remains OBS appropriate and will d/c before 2 midnights.  Dispo: The patient is from: Home               Anticipated d/c is to: Home              Anticipated d/c date is: 2 days              Patient currently is not medically stable to d/c.        Vernelle Emerald MD [facility] Pager [phone]  If 7PM-7AM, please contact night-coverage [email or web address] Use universal [place] password for that web site. If you do not have the password, please call the hospital operator.  08/13/2020, 7:17 AM

## 2020-08-13 NOTE — Progress Notes (Signed)
Right Lower Ext. study completed.   See CVProc for preliminary results.   Rosario Kushner, RDMS, RVT 

## 2020-08-13 NOTE — Consult Note (Signed)
[facility] Nurse Consult Note: Reason for Consult: RLE anterior aspect full thickness wound. Reports that the initial presentation was a blister that ruptured. Periwound tissue is warm, edematous, indurated Wound type: Infectious, venous insufficiency Pressure Injury POA: N/A Measurement: cm x 0.6cm x 0.2cm Wound bed: ruddy red, moist Drainage (amount, consistency, odor) scant serosanguinous on old dressing Periwound: as described above: edematous, erythematous, indurated, warm. No indications of previous wounds (scars) in the bilateral medial or lateral malleolar areas. Dressing procedure/placement/frequency: I have provided Nursing with conservative care guidance for the topical care of this wound using an antimicrobial nonadherent dressing (xeroform) topped with dry gauze and secured with Kerlix roll gauze. Daily changes are indicated. Heels are to be floated and a sacral prophylactic foam dressing placed to prevent pressure injuries.  [place] nursing team will not follow, but will remain available to this patient, the nursing and medical teams.  Please re-consult if needed. Thanks, Maudie Flakes, MSN, RN, [place], [facility]  Pager# [phone]

## 2020-08-13 NOTE — Assessment & Plan Note (Signed)
Or atarax prn

## 2020-08-13 NOTE — Progress Notes (Signed)
74 year old female with past medical history of gastroesophageal reflux disease, hypertension, hyperlipidemia, osteoarthritis, chronic venous insufficiency and pulmonary emboli diagnosed in March 2021 (Xarelto ended 07/2020) who presented to [facility] emergency department with complaints of headaches and lightheadedness. See H&P for full details.   A/P: Sepsis due to cellulitis ([place]) Cellulitis of right lower extremity Microcytic anemia Lactic acidosis Essential hypertension Injury of back due to fall Mixed hyperlipidemia  Continue current abx, course. Lumbar film results noted. Stable appearance. Can have PT/OT check her out as she stabilizes from sepsis. Follow Hgb after transfusion. She is otherwise ok. Continue as per H&P.    General: 74 y.o. female resting in bed in NAD Eyes: PERRL, normal sclera ENMT: Nares patent w/o discharge, orophaynx clear, dentition normal, ears w/o discharge/lesions/ulcers Neck: Supple, trachea midline Cardiovascular: tachy, +S1, S2, no m/g/r, equal pulses throughout Respiratory: CTABL, no w/r/r, normal WOB GI: BS+, NDNT, no masses noted, no organomegaly noted MSK: No ec/c; erythema/edema of RLE, shallow ulceration RLE noted Neuro: A&O x 3, no focal deficits Psyc: Appropriate interaction and affect, calm/cooperative

## 2020-08-13 NOTE — Assessment & Plan Note (Signed)
Benign appearing, for derm referral

## 2020-08-13 NOTE — ED Notes (Signed)
Placed patient on hospital bed and removed excess linens under patient. Placed pillow under patient's left leg per request. Purewick in place. Pt in NAD. VSS.

## 2020-08-14 ENCOUNTER — Other Ambulatory Visit: Payer: Self-pay

## 2020-08-14 DIAGNOSIS — A419 Sepsis, unspecified organism: Secondary | ICD-10-CM | POA: Diagnosis not present

## 2020-08-14 DIAGNOSIS — L0390 Cellulitis, unspecified: Secondary | ICD-10-CM | POA: Diagnosis not present

## 2020-08-14 LAB — CBC
HCT: 28 % — ABNORMAL LOW (ref 36.0–46.0)
Hemoglobin: 8.3 g/dL — ABNORMAL LOW (ref 12.0–15.0)
MCH: 22.6 pg — ABNORMAL LOW (ref 26.0–34.0)
MCHC: 29.6 g/dL — ABNORMAL LOW (ref 30.0–36.0)
MCV: 76.1 fL — ABNORMAL LOW (ref 80.0–100.0)
Platelets: 376 10*3/uL (ref 150–400)
RBC: 3.68 MIL/uL — ABNORMAL LOW (ref 3.87–5.11)
RDW: 20.9 % — ABNORMAL HIGH (ref 11.5–15.5)
WBC: 26.8 10*3/uL — ABNORMAL HIGH (ref 4.0–10.5)
nRBC: 0 % (ref 0.0–0.2)

## 2020-08-14 LAB — TYPE AND SCREEN
ABO/RH(D): A POS
Antibody Screen: NEGATIVE
Unit division: 0
Unit division: 0

## 2020-08-14 LAB — BPAM RBC
Blood Product Expiration Date: 202111052359
Blood Product Expiration Date: 202111092359
ISSUE DATE / TIME: 202110260156
ISSUE DATE / TIME: 202110262046
Unit Type and Rh: 6200
Unit Type and Rh: 6200

## 2020-08-14 LAB — COMPREHENSIVE METABOLIC PANEL
ALT: 15 U/L (ref 0–44)
AST: 15 U/L (ref 15–41)
Albumin: 2.5 g/dL — ABNORMAL LOW (ref 3.5–5.0)
Alkaline Phosphatase: 85 U/L (ref 38–126)
Anion gap: 9 (ref 5–15)
BUN: 12 mg/dL (ref 8–23)
CO2: 24 mmol/L (ref 22–32)
Calcium: 8.9 mg/dL (ref 8.9–10.3)
Chloride: 104 mmol/L (ref 98–111)
Creatinine, Ser: 1.03 mg/dL — ABNORMAL HIGH (ref 0.44–1.00)
GFR, Estimated: 57 mL/min — ABNORMAL LOW (ref >60)
Glucose, Bld: 143 mg/dL — ABNORMAL HIGH (ref 70–99)
Potassium: 3.7 mmol/L (ref 3.5–5.1)
Sodium: 137 mmol/L (ref 135–145)
Total Bilirubin: 0.7 mg/dL (ref 0.3–1.2)
Total Protein: 5.6 g/dL — ABNORMAL LOW (ref 6.5–8.1)

## 2020-08-14 LAB — URINE CULTURE: Culture: <10000 — AB

## 2020-08-14 LAB — MAGNESIUM: Magnesium: 1.9 mg/dL (ref 1.7–2.4)

## 2020-08-14 LAB — HEMOGLOBIN AND HEMATOCRIT, BLOOD
HCT: 26.4 % — ABNORMAL LOW (ref 36.0–46.0)
Hemoglobin: 8 g/dL — ABNORMAL LOW (ref 12.0–15.0)

## 2020-08-14 MED ORDER — VITAMIN B-12 1000 MCG PO TABS
1000.0000 ug | ORAL_TABLET | Freq: Every day | ORAL | Status: DC
  Administered 2020-08-14 – 2020-08-23 (×10): 1000 ug via ORAL
  Filled 2020-08-14 (×10): qty 1

## 2020-08-14 MED ORDER — FERROUS SULFATE 325 (65 FE) MG PO TABS
325.0000 mg | ORAL_TABLET | Freq: Two times a day (BID) | ORAL | Status: DC
  Administered 2020-08-14 – 2020-08-23 (×19): 325 mg via ORAL
  Filled 2020-08-14 (×20): qty 1

## 2020-08-14 MED ORDER — PANTOPRAZOLE SODIUM 40 MG PO TBEC
40.0000 mg | DELAYED_RELEASE_TABLET | Freq: Every day | ORAL | Status: DC
  Administered 2020-08-14 – 2020-08-23 (×10): 40 mg via ORAL
  Filled 2020-08-14 (×10): qty 1

## 2020-08-14 NOTE — Evaluation (Signed)
Physical Therapy Evaluation Patient Details Name: [name] MRN: [id number] DOB: 1946/07/24 Today's Date: 08/14/2020   History of Present Illness  Pt is a 74 y.o. female admitted 08/12/20 with c/o lethargy, weakness and poor appetite that started after RLE blister burst 1-2 wks prior; pt also with recent fall. Workup for sepsis secondary to cellulitis. (-) BLE DVT. PMH includes GERD, HTN, HLD, OA, PE, chronic venous insufficiency.    Clinical Impression  Pt presents with an overall decrease in functional mobility secondary to above. PTA, pt mod indep with intermittent use of RW, lives alone with family nearby to assist with transportation. Today, pt able to ambulate short distance in room and washup at sink with RW and min guard, required assist for some standing and LB ADL tasks. Pt limited by generalized weakness, RLE pain and decreased activity tolerance. Discussed d/c recommendations for 24/7 assist available upon return home since pt declining SNF, pt reports son available. Will follow acutely to address established goals.    Follow Up Recommendations Home health PT;Supervision for mobility/OOB (declined SNF)    Equipment Recommendations   (TBD)    Recommendations for Other Services       Precautions / Restrictions Precautions Precautions: Fall Restrictions Weight Bearing Restrictions: No      Mobility  Bed Mobility               General bed mobility comments: Received sitting in recliner    Transfers Overall transfer level: Needs assistance Equipment used: Rolling walker (2 wheeled) Transfers: Sit to/from Stand Sit to Stand: Min guard         General transfer comment: Multiple sit<>stands from recliner and BSC at sink for washup, close min guard for balance, heavy reliance on UE support to push/pull into standing  Ambulation/Gait Ambulation/Gait assistance: Min guard Gait Distance (Feet): 20 Feet Assistive device: Rolling walker (2 wheeled) Gait  Pattern/deviations: Step-to pattern;Trunk flexed;Wide base of support;Antalgic;Decreased weight shift to right Gait velocity: Decreased   General Gait Details: Slow, antalgic gait in room with RW and min guard for balance; cues to maintain closer proximity to and upright posture with RW  Stairs            Wheelchair Mobility    Modified Rankin (Stroke Patients Only)       Balance Overall balance assessment: Needs assistance Sitting-balance support: No upper extremity supported;Feet supported;Feet unsupported Sitting balance-Leahy Scale: Fair       Standing balance-Leahy Scale: Fair Standing balance comment: Can static stand without UE support, but reliant on single UE support or leaning trunk against sink to perform dynamic standing task (bathing at sink, brushing teeth, washing face)                             Pertinent Vitals/Pain Pain Assessment: Faces Faces Pain Scale: Hurts little more Pain Location: RLE Pain Descriptors / Indicators: Guarding;Heaviness Pain Intervention(s): Monitored during session;Limited activity within patient's tolerance    Home Living Family/patient expects to be discharged to:: Private residence Living Arrangements: Alone Available Help at Discharge: Family;Available 24 hours/day Type of Home: House Home Access: Ramped entrance     Home Layout: One level Home Equipment: Walker - 2 wheels;Walker - 4 wheels;Hand held shower head;Shower seat;Wheelchair - manual Additional Comments: Reports daughter, son and multiple other family members live nearby; son can assist for initial few days upon return home (he's not currently working)    Prior Function Level of Independence: Independent  with assistive device(s)         Comments: Mod indep with intermittent use of rollator; sits on seat in walk in shower to bathe; family drives; enjoys shopping, but limited community ambulator     Hand Dominance        Extremity/Trunk  Assessment   Upper Extremity Assessment Upper Extremity Assessment: Generalized weakness    Lower Extremity Assessment Lower Extremity Assessment: Generalized weakness;RLE deficits/detail RLE Deficits / Details: RLE cellulitis with significant redness/swelling RLE: Unable to fully assess due to pain RLE Coordination: decreased gross motor       Communication   Communication: No difficulties  Cognition Arousal/Alertness: Awake/alert Behavior During Therapy: WFL for tasks assessed/performed Overall Cognitive Status: Within Functional Limits for tasks assessed                                 General Comments: WFL for simple tasks, not formally assessed      General Comments General comments (skin integrity, edema, etc.): Assisted with washup at sink, pt required assist to reach back and posterior pericare    Exercises     Assessment/Plan    PT Assessment Patient needs continued PT services  PT Problem List Decreased strength;Decreased activity tolerance;Decreased balance;Decreased mobility;Decreased knowledge of use of DME;Pain       PT Treatment Interventions DME instruction;Gait training;Functional mobility training;Therapeutic activities;Therapeutic exercise;Balance training;Patient/family education    PT Goals (Current goals can be found in the Care Plan section)  Acute Rehab PT Goals Patient Stated Goal: Return home with son who can help PT Goal Formulation: With patient Time For Goal Achievement: 08/28/20 Potential to Achieve Goals: Good    Frequency Min 3X/week   Barriers to discharge        Co-evaluation               AM-PAC PT "6 Clicks" Mobility  Outcome Measure Help needed turning from your back to your side while in a flat bed without using bedrails?: A Little Help needed moving from lying on your back to sitting on the side of a flat bed without using bedrails?: A Little Help needed moving to and from a bed to a chair (including a  wheelchair)?: A Little Help needed standing up from a chair using your arms (e.g., wheelchair or bedside chair)?: A Little Help needed to walk in hospital room?: A Little Help needed climbing 3-5 steps with a railing? : A Lot 6 Click Score: 17    End of Session   Activity Tolerance: Patient tolerated treatment well Patient left: in chair;with call bell/phone within reach Nurse Communication: Mobility status PT Visit Diagnosis: Other abnormalities of gait and mobility (R26.89);Pain Pain - Right/Left: Right Pain - part of body: Leg    Time: 9767-3419 PT Time Calculation (min) (ACUTE ONLY): 25 min   Charges:   PT Evaluation $PT Eval Moderate Complexity: 1 Mod PT Treatments $Therapeutic Activity: 8-22 mins   Mabeline Caras, PT, DPT Acute Rehabilitation Services  Pager [phone] Office [place] 08/14/2020, 5:07 PM

## 2020-08-14 NOTE — Plan of Care (Signed)

## 2020-08-14 NOTE — Plan of Care (Signed)

## 2020-08-14 NOTE — Progress Notes (Signed)
PROGRESS NOTE    [name]  ZOX:[id number] DOB: September 27, 1946 DOA: 08/12/2020 PCP: Biagio Borg, MD   Brief Narrative: Taken from H&P 74 year old female with past medical history of gastroesophageal reflux disease, hypertension, hyperlipidemia, osteoarthritis, chronic venous insufficiency and pulmonary emboli diagnosed in March 2021 (Xarelto ended 07/2020) who presented to [facility] emergency department with complaints of headaches and lightheadedness.  Patient explains that 1-2 weeks ago, a blister on the anterior surface of her right leg had burst.  In the days following, patient began to develop some drainage from this area with surrounding redness and swelling.  Later developed worsening generalized weakness and lethargy.  Poor appetite and lightheadedness. Admitted with sepsis secondary to cellulitis.  Initially started on ceftriaxone which was switched to vancomycin according to protocol. Patient also has an hemoglobin of 6.8 on presentation without any obvious bleeding.  Received 1 unit of PRBC.  Subjective: Patient was feeling little better when seen today.  Lower extremity pain is improving.  She appears tired when asked stating that he is just little sleepy after eating breakfast.  Assessment & Plan:   Principal Problem:   Sepsis due to cellulitis [facility]) Active Problems:   Essential hypertension   Mixed hyperlipidemia   Cellulitis of right lower extremity   Injury of back due to fall   Microcytic anemia   Lactic acidosis   Sepsis ([place])  Sepsis secondary to cellulitis POA.  Patient initially met sepsis criteria with fever, tachycardia, tachypnea and leukocytosis along with lactic acidosis, which resolved. Blood and urine cultures negative.  Wound cultures pending. Patient received IV fluid and broad-spectrum antibiotics according to protocol. Antibiotic switched to vancomycin for severe purulent cellulitis. Patient also has an history of chronic venous  stasis. -Continue with vancomycin. -We will also obtain right lower extremity ultrasound to rule out DVT as patient recently stopped taking her Xarelto. -PT/OT evaluation.  Microcytic anemia.  On presentation her hemoglobin was 6.8 without any obvious bleeding.  No blood in the rectal vault for Hemoccult testing in ED. FOBT order-still pending.  She received 1 unit of PRBC. Hemoglobin improved to 8.3 today.  Anemia panel with iron deficiency anemia and B12 within lower normal limit of 205. Patient has an history of lower GI bleed in 2016 and at that time endoscopy/capsule endoscopy and colonoscopy was done which was positive for diverticulosis and hemorrhoids.  Pathology was negative for any malignancy.  They were recommending outpatient banding, not sure whether it was done or not. -Start her on iron supplement-we will avoid IV iron due to ongoing infection. -Start her on B12 supplement. -She will need outpatient GI evaluation.  Essential hypertension.  Blood pressure mostly within goal but did become little soft at time. -Continue home antihypertensives-which include metoprolol, Cardizem and losartan. -Diuretic is being held secondary to sepsis. -Can restart diuretics if volume status worsened.  Back pain with recent fall at home.  Patient has an history of recent fall.  Imaging negative for any acute fracture. -PT/OT evaluation.  Hyperlipidemia. -Continue home statin.  GERD.  No obvious GI bleed.  Discontinue IV Protonix. -Continue with oral Protonix.   Objective: Vitals:   08/13/20 2318 08/14/20 0506 08/14/20 0650 08/14/20 0654  BP: 117/62 98/64 (!) 100/52   Pulse: 92 84 79   Resp: 18 18 (!) 22 20  Temp: 99.3 F (37.4 C) 98.8 F (37.1 C) 99.7 F (37.6 C)   TempSrc: Oral Oral Oral   SpO2: 98% 98% 98%   Weight:  Height:        Intake/Output Summary (Last 24 hours) at 08/14/2020 1336 Last data filed at 08/14/2020 1300 Gross per 24 hour  Intake 1047.17 ml  Output  1000 ml  Net 47.17 ml   Filed Weights   08/12/20 2156  Weight: 98.4 kg    Examination:  General exam: Appears calm and comfortable  Respiratory system: Clear to auscultation. Respiratory effort normal. Cardiovascular system: S1 & S2 heard, RRR. Gastrointestinal system: Soft, nontender, nondistended, bowel sounds positive. Central nervous system: Alert and oriented. No focal neurological deficits. Extremities: Right LE with 1+ and left with trace edema, no cyanosis, pulses intact and symmetrical.  Clean bandage in place on right lower leg. Psychiatry: Judgement and insight appear normal. Mood & affect appropriate.    DVT prophylaxis: SCDs Code Status: DNR Family Communication: Discussed with patient Disposition Plan:  Status is: Inpatient  Remains inpatient appropriate because:Inpatient level of care appropriate due to severity of illness   Dispo: The patient is from: Home              Anticipated d/c is to: Home              Anticipated d/c date is: 2 days              Patient currently is not medically stable to d/c.  Consultants:   None  Procedures:  Antimicrobials:  Vancomycin  Data Reviewed: I have personally reviewed following labs and imaging studies  CBC: Recent Labs  Lab 08/12/20 2014 08/13/20 0435 08/13/20 1807 08/14/20 0201 08/14/20 0725  WBC 34.6* 29.6* 28.1*  --  26.8*  NEUTROABS 31.9*  --   --   --   --   HGB 6.8* 7.4* 7.1* 8.0* 8.3*  HCT 24.1* 24.6* 24.5* 26.4* 28.0*  MCV 71.3* 74.1* 74.0*  --  76.1*  PLT 542* 458* 399  --  496   Basic Metabolic Panel: Recent Labs  Lab 08/12/20 2014 08/13/20 0435 08/14/20 0550  NA 135 135 137  K 4.0 3.7 3.7  CL 99 101 104  CO2 24 22 24   GLUCOSE 121* 104* 143*  BUN 23 16 12   CREATININE 1.09* 1.01* 1.03*  CALCIUM 9.2 8.7* 8.9  MG  --  1.8 1.9   GFR: Estimated Creatinine Clearance: 54.4 mL/min (A) (by C-G formula based on SCr of 1.03 mg/dL (H)). Liver Function Tests: Recent Labs  Lab  08/12/20 2014 08/13/20 0435 08/14/20 0550  AST 19 18 15   ALT 15 15 15   ALKPHOS 86 82 85  BILITOT 1.0 1.3* 0.7  PROT 6.5 5.5* 5.6*  ALBUMIN 3.5 2.8* 2.5*   No results for input(s): LIPASE, AMYLASE in the last 168 hours. No results for input(s): AMMONIA in the last 168 hours. Coagulation Profile: Recent Labs  Lab 08/12/20 2014 08/13/20 0435  INR 1.1 1.2   Cardiac Enzymes: No results for input(s): CKTOTAL, CKMB, CKMBINDEX, TROPONINI in the last 168 hours. BNP (last 3 results) Recent Labs    12/11/19 1105  PROBNP 26.0   HbA1C: No results for input(s): HGBA1C in the last 72 hours. CBG: No results for input(s): GLUCAP in the last 168 hours. Lipid Profile: No results for input(s): CHOL, HDL, LDLCALC, TRIG, CHOLHDL, LDLDIRECT in the last 72 hours. Thyroid Function Tests: No results for input(s): TSH, T4TOTAL, FREET4, T3FREE, THYROIDAB in the last 72 hours. Anemia Panel: Recent Labs    08/13/20 0435  VITAMINB12 205  FOLATE 13.0  TIBC 454*  IRON 31  RETICCTPCT  1.5   Sepsis Labs: Recent Labs  Lab 08/12/20 2014 08/12/20 2214 08/13/20 0435  PROCALCITON  --   --  0.60  LATICACIDVEN 2.5* 1.9  --     Recent Results (from the past 240 hour(s))  Respiratory Panel by RT PCR (Flu A&B, Covid) - Nasopharyngeal Swab     Status: None   Collection Time: 08/12/20  8:16 PM   Specimen: Nasopharyngeal Swab  Result Value Ref Range Status   SARS Coronavirus 2 by RT PCR NEGATIVE NEGATIVE Final    Comment: (NOTE) SARS-CoV-2 target nucleic acids are NOT DETECTED.  The SARS-CoV-2 RNA is generally detectable in upper respiratoy specimens during the acute phase of infection. The lowest concentration of SARS-CoV-2 viral copies this assay can detect is 131 copies/mL. A negative result does not preclude SARS-Cov-2 infection and should not be used as the sole basis for treatment or other patient management decisions. A negative result may occur with  improper specimen  collection/handling, submission of specimen other than nasopharyngeal swab, presence of viral mutation(s) within the areas targeted by this assay, and inadequate number of viral copies (<131 copies/mL). A negative result must be combined with clinical observations, patient history, and epidemiological information. The expected result is Negative.  Fact Sheet for Patients:  PinkCheek.be  Fact Sheet for Healthcare Providers:  GravelBags.it  This test is no t yet approved or cleared by the [place] FDA and  has been authorized for detection and/or diagnosis of SARS-CoV-2 by FDA under an Emergency Use Authorization (EUA). This EUA will remain  in effect (meaning this test can be used) for the duration of the COVID-19 declaration under Section 564(b)(1) of the Act, 21 U.S.C. section 360bbb-3(b)(1), unless the authorization is terminated or revoked sooner.     Influenza A by PCR NEGATIVE NEGATIVE Final   Influenza B by PCR NEGATIVE NEGATIVE Final    Comment: (NOTE) The Xpert Xpress SARS-CoV-2/FLU/RSV assay is intended as an aid in  the diagnosis of influenza from Nasopharyngeal swab specimens and  should not be used as a sole basis for treatment. Nasal washings and  aspirates are unacceptable for Xpert Xpress SARS-CoV-2/FLU/RSV  testing.  Fact Sheet for Patients: PinkCheek.be  Fact Sheet for Healthcare Providers: GravelBags.it  This test is not yet approved or cleared by the [place] FDA and  has been authorized for detection and/or diagnosis of SARS-CoV-2 by  FDA under an Emergency Use Authorization (EUA). This EUA will remain  in effect (meaning this test can be used) for the duration of the  Covid-19 declaration under Section 564(b)(1) of the Act, 21  U.S.C. section 360bbb-3(b)(1), unless the authorization is  terminated or revoked. Performed at [facility], [place] [address] [place], [place] [address]   Blood Culture (routine x 2)     Status: None (Preliminary result)   Collection Time: 08/12/20  8:18 PM   Specimen: BLOOD  Result Value Ref Range Status   Specimen Description BLOOD LEFT ANTECUBITAL  Final   Special Requests   Final    BOTTLES DRAWN AEROBIC AND ANAEROBIC Blood Culture adequate volume   Culture   Final    NO GROWTH 2 DAYS Performed at [facility], [place] [address], [place], [place] [address]    Report Status PENDING  Incomplete  Blood Culture (routine x 2)     Status: None (Preliminary result)   Collection Time: 08/12/20  8:20 PM   Specimen: BLOOD  Result Value Ref Range Status   Specimen  Description BLOOD RIGHT ANTECUBITAL  Final   Special Requests   Final    BOTTLES DRAWN AEROBIC AND ANAEROBIC Blood Culture results may not be optimal due to an excessive volume of blood received in culture bottles   Culture   Final    NO GROWTH 2 DAYS Performed at [facility], [place] [address], [place], [place] [address]    Report Status PENDING  Incomplete  Urine culture     Status: Abnormal   Collection Time: 08/13/20 12:11 AM   Specimen: In/Out Cath Urine  Result Value Ref Range Status   Specimen Description IN/OUT CATH URINE  Final   Special Requests NONE  Final   Culture (A)  Final    <10,000 COLONIES/mL INSIGNIFICANT GROWTH Performed at [place] [address], [place], [place] [address]    Report Status 08/14/2020 FINAL  Final  Aerobic Culture (superficial specimen)     Status: None (Preliminary result)   Collection Time: 08/13/20  2:43 AM   Specimen: Wound  Result Value Ref Range Status   Specimen Description WOUND RIGHT LOWER LEG  Final   Special Requests NONE  Final   Gram Stain   Final    RARE WBC PRESENT, PREDOMINANTLY PMN NO ORGANISMS SEEN    Culture   Final    CULTURE REINCUBATED FOR BETTER GROWTH Performed at [facility], [place] [address] [place], [place] [address]     Report Status PENDING  Incomplete     Radiology Studies: DG Chest 1 View  Result Date: 08/12/2020 CLINICAL DATA:  Headache post fall EXAM: CHEST  1 VIEW COMPARISON:  04/12/2019 FINDINGS: The heart size and mediastinal contours are within normal limits. Aortic atherosclerosis. Both lungs are clear. The visualized skeletal structures are unremarkable. IMPRESSION: No active disease. Electronically Signed   By: Donavan Foil M.D.   On: 08/12/2020 21:12   DG Lumbar Spine 2-3 Views  Result Date: 08/13/2020 CLINICAL DATA:  Fall.  Dizziness. EXAM: LUMBAR SPINE - 2-3 VIEW COMPARISON:  Lumbar spine radiographs 11/10/2017 FINDINGS: Grade 1 anterolisthesis is present at L3-4 at L4-5 without significant interval change. Remote superior endplate fracture at L1 is noted. Vertebral body heights are otherwise normal. No acute fractures are present. Vascular calcifications are noted in the aorta. Calcified uterine fibroid is stable. Visualized pelvis is unremarkable. IMPRESSION: 1. No acute abnormality or significant interval change. 2. Stable grade 1 anterolisthesis at L3-4 and L4-5. 3. Remote superior endplate fracture at L1. Electronically Signed   By: San Morelle M.D.   On: 08/13/2020 05:50   DG Tibia/Fibula Right  Result Date: 08/12/2020 CLINICAL DATA:  Right leg pain after fall EXAM: RIGHT TIBIA AND FIBULA - 2 VIEW COMPARISON:  05/22/2014 FINDINGS: Right knee replacement. No acute displaced fracture or malalignment. Diffuse subcutaneous edema IMPRESSION: No acute osseous abnormality. Electronically Signed   By: Donavan Foil M.D.   On: 08/12/2020 21:13   VAS [place] LOWER EXTREMITY VENOUS (DVT)  Result Date: 08/13/2020  Lower Venous DVTStudy Indications: Pain, Swelling, and Edema.  Risk Factors: Obesity. Performing Technologist: Griffin Basil RCT RDMS  Examination Guidelines: A complete evaluation includes B-mode imaging, spectral Doppler, color Doppler, and power Doppler as needed of all  accessible portions of each vessel. Bilateral testing is considered an integral part of a complete examination. Limited examinations for reoccurring indications may be performed as noted. The reflux portion of the exam is performed with the patient in reverse Trendelenburg.  +---------+---------------+---------+-----------+----------+-------------------+ RIGHT    CompressibilityPhasicitySpontaneityPropertiesThrombus  Aging      +---------+---------------+---------+-----------+----------+-------------------+ CFV      Full           Yes      Yes                                      +---------+---------------+---------+-----------+----------+-------------------+ SFJ      Full                                                             +---------+---------------+---------+-----------+----------+-------------------+ FV Prox  Full                                                             +---------+---------------+---------+-----------+----------+-------------------+ FV Mid   Full                                                             +---------+---------------+---------+-----------+----------+-------------------+ FV DistalFull                                                             +---------+---------------+---------+-----------+----------+-------------------+ PFV      Full                                                             +---------+---------------+---------+-----------+----------+-------------------+ POP      Full           Yes      Yes                                      +---------+---------------+---------+-----------+----------+-------------------+ PTV                                                   Non Visualized due                                                        to Edema - Obesity  and inability to                                                           tolorate probe                                                            placement           +---------+---------------+---------+-----------+----------+-------------------+ PERO                                                  Non Visualized Due                                                        to Edema, Obesity                                                         and inability to                                                          tolorate probe                                                            placement .         +---------+---------------+---------+-----------+----------+-------------------+     Summary: RIGHT: - There is no evidence of deep vein thrombosis in the lower extremity.  - No cystic structure found in the popliteal fossa.  LEFT: - No evidence of common femoral vein obstruction.  *See table(s) above for measurements and observations. Electronically signed by Deitra Mayo MD on 08/13/2020 at 5:40:00 PM.    Final     Scheduled Meds: . atorvastatin  20 mg Oral Daily  . diltiazem  240 mg Oral Daily  . DULoxetine  40 mg Oral Daily  . gabapentin  200 mg Oral QHS  . losartan  100 mg Oral Daily  . metoprolol tartrate  50 mg Oral BID  . pantoprazole (PROTONIX) IV  40 mg Intravenous Q12H  . Prednisol Ace-Moxiflox-Bromfen  1 drop Right Eye QID   Continuous Infusions: . vancomycin 750 mg (08/14/20 0939)     LOS: 1 day   Time spent: 35 minutes.  Lorella Nimrod, MD [facility]  If  7PM-7AM, please contact night-coverage [email or web address]  08/14/2020, 1:36 PM   This record has been created using Systems analyst. Errors have been sought and corrected,but may not always be located. Such creation errors do not reflect on the standard of care.

## 2020-08-15 ENCOUNTER — Inpatient Hospital Stay (HOSPITAL_COMMUNITY): Payer: Medicare Other

## 2020-08-15 DIAGNOSIS — M7989 Other specified soft tissue disorders: Secondary | ICD-10-CM

## 2020-08-15 DIAGNOSIS — M79609 Pain in unspecified limb: Secondary | ICD-10-CM | POA: Diagnosis not present

## 2020-08-15 DIAGNOSIS — L538 Other specified erythematous conditions: Secondary | ICD-10-CM

## 2020-08-15 DIAGNOSIS — R609 Edema, unspecified: Secondary | ICD-10-CM

## 2020-08-15 DIAGNOSIS — L0390 Cellulitis, unspecified: Secondary | ICD-10-CM | POA: Diagnosis not present

## 2020-08-15 DIAGNOSIS — A419 Sepsis, unspecified organism: Secondary | ICD-10-CM | POA: Diagnosis not present

## 2020-08-15 LAB — GLUCOSE, CAPILLARY
Glucose-Capillary: 111 mg/dL — ABNORMAL HIGH (ref 70–99)
Glucose-Capillary: 137 mg/dL — ABNORMAL HIGH (ref 70–99)
Glucose-Capillary: 126 mg/dL — ABNORMAL HIGH (ref 70–99)
Glucose-Capillary: 118 mg/dL — ABNORMAL HIGH (ref 70–99)

## 2020-08-15 LAB — BASIC METABOLIC PANEL
Anion gap: 10 (ref 5–15)
BUN: 15 mg/dL (ref 8–23)
CO2: 20 mmol/L — ABNORMAL LOW (ref 22–32)
Calcium: 8.6 mg/dL — ABNORMAL LOW (ref 8.9–10.3)
Chloride: 104 mmol/L (ref 98–111)
Creatinine, Ser: 1.08 mg/dL — ABNORMAL HIGH (ref 0.44–1.00)
GFR, Estimated: 54 mL/min — ABNORMAL LOW (ref >60)
Glucose, Bld: 141 mg/dL — ABNORMAL HIGH (ref 70–99)
Potassium: 3.8 mmol/L (ref 3.5–5.1)
Sodium: 134 mmol/L — ABNORMAL LOW (ref 135–145)

## 2020-08-15 LAB — CBC
HCT: 27.7 % — ABNORMAL LOW (ref 36.0–46.0)
Hemoglobin: 8.3 g/dL — ABNORMAL LOW (ref 12.0–15.0)
MCH: 22.6 pg — ABNORMAL LOW (ref 26.0–34.0)
MCHC: 30 g/dL (ref 30.0–36.0)
MCV: 75.5 fL — ABNORMAL LOW (ref 80.0–100.0)
Platelets: 389 10*3/uL (ref 150–400)
RBC: 3.67 MIL/uL — ABNORMAL LOW (ref 3.87–5.11)
RDW: 21.5 % — ABNORMAL HIGH (ref 11.5–15.5)
WBC: 28.7 10*3/uL — ABNORMAL HIGH (ref 4.0–10.5)
nRBC: 0 % (ref 0.0–0.2)

## 2020-08-15 MED ORDER — SODIUM CHLORIDE 0.9 % IV SOLN
2.0000 g | Freq: Two times a day (BID) | INTRAVENOUS | Status: DC
  Administered 2020-08-15 – 2020-08-16 (×3): 2 g via INTRAVENOUS
  Filled 2020-08-15 (×4): qty 2

## 2020-08-15 MED ORDER — INSULIN ASPART 100 UNIT/ML ~~LOC~~ SOLN: 0.0000 [IU] | Freq: Every day | SUBCUTANEOUS | Status: DC

## 2020-08-15 MED ORDER — INSULIN ASPART 100 UNIT/ML ~~LOC~~ SOLN
0.0000 [IU] | Freq: Three times a day (TID) | SUBCUTANEOUS | Status: DC
  Administered 2020-08-15 – 2020-08-19 (×5): 2 [IU] via SUBCUTANEOUS

## 2020-08-15 MED ORDER — LACTATED RINGERS IV BOLUS
500.0000 mL | Freq: Once | INTRAVENOUS | Status: AC
  Administered 2020-08-15: 500 mL via INTRAVENOUS

## 2020-08-15 MED ORDER — HYDROMORPHONE HCL 1 MG/ML IJ SOLN
1.0000 mg | INTRAMUSCULAR | Status: DC | PRN
  Administered 2020-08-15 – 2020-08-19 (×8): 1 mg via INTRAVENOUS
  Filled 2020-08-15 (×7): qty 1

## 2020-08-15 MED ORDER — IBUPROFEN 600 MG PO TABS
600.0000 mg | ORAL_TABLET | Freq: Once | ORAL | Status: AC
  Administered 2020-08-15: 600 mg via ORAL
  Filled 2020-08-15: qty 1

## 2020-08-15 NOTE — Progress Notes (Signed)
Right Lower Ext. study completed.   See CVProc for preliminary results.   Filomena Pokorney, RDMS, RVT 

## 2020-08-15 NOTE — Progress Notes (Signed)
PROGRESS NOTE    [name]  BJY:[id number] DOB: August 02, 1946 DOA: 08/12/2020 PCP: Biagio Borg, MD   Brief Narrative: Taken from H&P 74 year old female with past medical history of gastroesophageal reflux disease, hypertension, hyperlipidemia, osteoarthritis, chronic venous insufficiency and pulmonary emboli diagnosed in March 2021 (Xarelto ended 07/2020) who presented to [facility] emergency department with complaints of headaches and lightheadedness.  Patient explains that 1-2 weeks ago, a blister on the anterior surface of her right leg had burst.  In the days following, patient began to develop some drainage from this area with surrounding redness and swelling.  Later developed worsening generalized weakness and lethargy.  Poor appetite and lightheadedness. Admitted with sepsis secondary to cellulitis.  Initially started on ceftriaxone which was switched to vancomycin according to protocol. Patient also has an hemoglobin of 6.8 on presentation without any obvious bleeding.  Received 1 unit of PRBC.  Subjective: Patient is having worsening pain and swelling with erythema of right lower extremity.  She also become febrile overnight.  Assessment & Plan:   Principal Problem:   Sepsis due to cellulitis [facility]) Active Problems:   Essential hypertension   Mixed hyperlipidemia   Cellulitis of right lower extremity   Injury of back due to fall   Microcytic anemia   Lactic acidosis   Sepsis ([place])  Sepsis secondary to cellulitis POA.  Patient initially met sepsis criteria with fever, tachycardia, tachypnea and leukocytosis along with lactic acidosis, which resolved. Blood and urine cultures negative.  Wound cultures pending. Patient with worsening and expansion of her right lower extremity cellulitis.  Venous Doppler negative for DVT.  Worsening leukocytosis and become febrile with temperature of 101.9 earlier today around 5 AM Patient received IV fluid and broad-spectrum  antibiotics according to protocol. Antibiotic switched to vancomycin for severe purulent cellulitis. -Add cefepime. -Continue with vancomycin. -Skin was marked with sharpie to see if there is more expansion tomorrow. -PT/OT evaluation.  Microcytic anemia.  On presentation her hemoglobin was 6.8 without any obvious bleeding.  No blood in the rectal vault for Hemoccult testing in ED. FOBT order-still pending.  She received 1 unit of PRBC. Hemoglobin improved to 8.3 today, seems stable.  Anemia panel with iron deficiency anemia and B12 within lower normal limit of 205. Patient has an history of lower GI bleed in 2016 and at that time endoscopy/capsule endoscopy and colonoscopy was done which was positive for diverticulosis and hemorrhoids.  Pathology was negative for any malignancy.  They were recommending outpatient banding, not sure whether it was done or not. -Continue with p.o. supplement. -She will need outpatient GI evaluation.  Essential hypertension.  Blood pressure mostly within goal but did become little soft at time. -Continue home antihypertensives-which include metoprolol, Cardizem and losartan. -Diuretic is being held secondary to sepsis. -Can restart diuretics if volume status worsened.  Back pain with recent fall at home.  Patient has an history of recent fall.  Imaging negative for any acute fracture. -PT/OT evaluation.  Hyperlipidemia. -Continue home statin.  GERD.  No obvious GI bleed.  Discontinue IV Protonix. -Continue with oral Protonix.   Objective: Vitals:   08/15/20 0558 08/15/20 0628 08/15/20 0800 08/15/20 1055  BP: (!) 101/54 (!) 108/51 (!) 98/43 100/60  Pulse: 98   80  Resp:   18 19  Temp: (!) 101.9 F (38.8 C)  99.2 F (37.3 C) 97.8 F (36.6 C)  TempSrc:   Oral Oral  SpO2:   99% 100%  Weight:  Height:        Intake/Output Summary (Last 24 hours) at 08/15/2020 1456 Last data filed at 08/15/2020 1100 Gross per 24 hour  Intake 753.6 ml   Output 650 ml  Net 103.6 ml   Filed Weights   08/12/20 2156  Weight: 98.4 kg    Examination:  General.  Well-developed, obese lady, in no acute distress. Pulmonary.  Lungs clear bilaterally, normal respiratory effort. CV.  Regular rate and rhythm, no JVD, rub or murmur. Abdomen.  Soft, nontender, nondistended, BS positive. CNS.  Alert and oriented x3.  No focal neurologic deficit. Extremities.  Worsening edema, erythema of right lower extremity not involving right knee and extending up to lateral side of thigh. Psychiatry.  Judgment and insight appears normal.  DVT prophylaxis: SCDs Code Status: DNR Family Communication: Discussed with patient Disposition Plan:  Status is: Inpatient  Remains inpatient appropriate because:Inpatient level of care appropriate due to severity of illness   Dispo: The patient is from: Home              Anticipated d/c is to: Home              Anticipated d/c date is: 2 days              Patient currently is not medically stable to d/c.  Consultants:   None  Procedures:  Antimicrobials:  Vancomycin Cefepime  Data Reviewed: I have personally reviewed following labs and imaging studies  CBC: Recent Labs  Lab 08/12/20 2014 08/12/20 2014 08/13/20 0435 08/13/20 1807 08/14/20 0201 08/14/20 0725 08/15/20 0359  WBC 34.6*  --  29.6* 28.1*  --  26.8* 28.7*  NEUTROABS 31.9*  --   --   --   --   --   --   HGB 6.8*   < > 7.4* 7.1* 8.0* 8.3* 8.3*  HCT 24.1*   < > 24.6* 24.5* 26.4* 28.0* 27.7*  MCV 71.3*  --  74.1* 74.0*  --  76.1* 75.5*  PLT 542*  --  458* 399  --  376 389   < > = values in this interval not displayed.   Basic Metabolic Panel: Recent Labs  Lab 08/12/20 2014 08/13/20 0435 08/14/20 0550 08/15/20 0359  NA 135 135 137 134*  K 4.0 3.7 3.7 3.8  CL 99 101 104 104  CO2 _0 20*  GLUCOSE 121* 104* 143* 141*  BUN _1 CREATININE 1.09* 1.01* 1.03* 1.08*  CALCIUM 9.2 8.7* 8.9 8.6*  MG  --  1.8 1.9  --     GFR: Estimated Creatinine Clearance: 51.9 mL/min (A) (by C-G formula based on SCr of 1.08 mg/dL (H)). Liver Function Tests: Recent Labs  Lab 08/12/20 2014 08/13/20 0435 08/14/20 0550  AST _2 ALT _3 ALKPHOS 86 82 85  BILITOT 1.0 1.3* 0.7  PROT 6.5 5.5* 5.6*  ALBUMIN 3.5 2.8* 2.5*   No results for input(s): LIPASE, AMYLASE in the last 168 hours. No results for input(s): AMMONIA in the last 168 hours. Coagulation Profile: Recent Labs  Lab 08/12/20 2014 08/13/20 0435  INR 1.1 1.2   Cardiac Enzymes: No results for input(s): CKTOTAL, CKMB, CKMBINDEX, TROPONINI in the last 168 hours. BNP (last 3 results) Recent Labs    12/11/19 1105  PROBNP 26.0   HbA1C: No results for input(s): HGBA1C in the last 72 hours. CBG: Recent Labs  Lab 08/15/20 0825 08/15/20 1150  GLUCAP 111* 137*  Lipid Profile: No results for input(s): CHOL, HDL, LDLCALC, TRIG, CHOLHDL, LDLDIRECT in the last 72 hours. Thyroid Function Tests: No results for input(s): TSH, T4TOTAL, FREET4, T3FREE, THYROIDAB in the last 72 hours. Anemia Panel: Recent Labs    08/13/20 0435  VITAMINB12 205  FOLATE 13.0  TIBC 454*  IRON 31  RETICCTPCT 1.5   Sepsis Labs: Recent Labs  Lab 08/12/20 2014 08/12/20 2214 08/13/20 0435  PROCALCITON  --   --  0.60  LATICACIDVEN 2.5* 1.9  --     Recent Results (from the past 240 hour(s))  Respiratory Panel by RT PCR (Flu A&B, Covid) - Nasopharyngeal Swab     Status: None   Collection Time: 08/12/20  8:16 PM   Specimen: Nasopharyngeal Swab  Result Value Ref Range Status   SARS Coronavirus 2 by RT PCR NEGATIVE NEGATIVE Final    Comment: (NOTE) SARS-CoV-2 target nucleic acids are NOT DETECTED.  The SARS-CoV-2 RNA is generally detectable in upper respiratoy specimens during the acute phase of infection. The lowest concentration of SARS-CoV-2 viral copies this assay can detect is 131 copies/mL. A negative result does not preclude SARS-Cov-2 infection  and should not be used as the sole basis for treatment or other patient management decisions. A negative result may occur with  improper specimen collection/handling, submission of specimen other than nasopharyngeal swab, presence of viral mutation(s) within the areas targeted by this assay, and inadequate number of viral copies (<131 copies/mL). A negative result must be combined with clinical observations, patient history, and epidemiological information. The expected result is Negative.  Fact Sheet for Patients:  PinkCheek.be  Fact Sheet for Healthcare Providers:  GravelBags.it  This test is no t yet approved or cleared by the [place] FDA and  has been authorized for detection and/or diagnosis of SARS-CoV-2 by FDA under an Emergency Use Authorization (EUA). This EUA will remain  in effect (meaning this test can be used) for the duration of the COVID-19 declaration under Section 564(b)(1) of the Act, 21 U.S.C. section 360bbb-3(b)(1), unless the authorization is terminated or revoked sooner.     Influenza A by PCR NEGATIVE NEGATIVE Final   Influenza B by PCR NEGATIVE NEGATIVE Final    Comment: (NOTE) The Xpert Xpress SARS-CoV-2/FLU/RSV assay is intended as an aid in  the diagnosis of influenza from Nasopharyngeal swab specimens and  should not be used as a sole basis for treatment. Nasal washings and  aspirates are unacceptable for Xpert Xpress SARS-CoV-2/FLU/RSV  testing.  Fact Sheet for Patients: PinkCheek.be  Fact Sheet for Healthcare Providers: GravelBags.it  This test is not yet approved or cleared by the [place] FDA and  has been authorized for detection and/or diagnosis of SARS-CoV-2 by  FDA under an Emergency Use Authorization (EUA). This EUA will remain  in effect (meaning this test can be used) for the duration of the  Covid-19 declaration  under Section 564(b)(1) of the Act, 21  U.S.C. section 360bbb-3(b)(1), unless the authorization is  terminated or revoked. Performed at [facility], [place] [address], [place], [place] [address]   Blood Culture (routine x 2)     Status: None (Preliminary result)   Collection Time: 08/12/20  8:18 PM   Specimen: BLOOD  Result Value Ref Range Status   Specimen Description BLOOD LEFT ANTECUBITAL  Final   Special Requests   Final    BOTTLES DRAWN AEROBIC AND ANAEROBIC Blood Culture adequate volume   Culture   Final    NO GROWTH 3  DAYS Performed at [facility], [place] [address] [place], [place] [address]    Report Status PENDING  Incomplete  Blood Culture (routine x 2)     Status: None (Preliminary result)   Collection Time: 08/12/20  8:20 PM   Specimen: BLOOD  Result Value Ref Range Status   Specimen Description BLOOD RIGHT ANTECUBITAL  Final   Special Requests   Final    BOTTLES DRAWN AEROBIC AND ANAEROBIC Blood Culture results may not be optimal due to an excessive volume of blood received in culture bottles   Culture   Final    NO GROWTH 3 DAYS Performed at [facility], [place] [address], [place], [place] [address]    Report Status PENDING  Incomplete  Urine culture     Status: Abnormal   Collection Time: 08/13/20 12:11 AM   Specimen: In/Out Cath Urine  Result Value Ref Range Status   Specimen Description IN/OUT CATH URINE  Final   Special Requests NONE  Final   Culture (A)  Final    <10,000 COLONIES/mL INSIGNIFICANT GROWTH Performed at [facility] [address] [place], [place] [address]    Report Status 08/14/2020 FINAL  Final  Aerobic Culture (superficial specimen)     Status: None (Preliminary result)   Collection Time: 08/13/20  2:43 AM   Specimen: Wound  Result Value Ref Range Status   Specimen Description WOUND RIGHT LOWER LEG  Final   Special Requests NONE  Final   Gram Stain   Final    RARE WBC PRESENT, PREDOMINANTLY PMN NO ORGANISMS  SEEN    Culture   Final    MODERATE CORYNEBACTERIUM PSEUDODIPHTHERIAE Standardized susceptibility testing for this organism is not available. RARE STAPHYLOCOCCUS EPIDERMIDIS SUSCEPTIBILITIES TO FOLLOW Performed at [facility], [place] [address] [place],  [address]    Report Status PENDING  Incomplete     Radiology Studies: VAS [place] LOWER EXTREMITY VENOUS (DVT)  Result Date: 08/15/2020  Lower Venous DVTStudy Indications: Pain, Swelling, Edema, and Erythema.  Risk Factors: Obesity. Performing Technologist: Griffin Basil RCT RDMS  Examination Guidelines: A complete evaluation includes B-mode imaging, spectral Doppler, color Doppler, and power Doppler as needed of all accessible portions of each vessel. Bilateral testing is considered an integral part of a complete examination. Limited examinations for reoccurring indications may be performed as noted. The reflux portion of the exam is performed with the patient in reverse Trendelenburg.  +---------+---------------+---------+-----------+----------+-----------------+  RIGHT     Compressibility Phasicity Spontaneity Properties Thrombus Aging     +---------+---------------+---------+-----------+----------+-----------------+  CFV       Full            Yes       Yes                                       +---------+---------------+---------+-----------+----------+-----------------+  SFJ       Full                                                                +---------+---------------+---------+-----------+----------+-----------------+  FV Prox   Full                                                                +---------+---------------+---------+-----------+----------+-----------------+  FV Mid    Full                                                                +---------+---------------+---------+-----------+----------+-----------------+  FV Distal                 Yes       Yes                                        +---------+---------------+---------+-----------+----------+-----------------+  PFV       Full                                                                +---------+---------------+---------+-----------+----------+-----------------+  POP                       Yes       Yes                                       +---------+---------------+---------+-----------+----------+-----------------+  PTV                                                        Color images only  +---------+---------------+---------+-----------+----------+-----------------+  PERO                                                       Color images only  +---------+---------------+---------+-----------+----------+-----------------+ Limited distal leg evaluation due to pain and swelling . Color images only , No compression images due to pain.  +----+---------------+---------+-----------+----------+--------------+  LEFT Compressibility Phasicity Spontaneity Properties Thrombus Aging  +----+---------------+---------+-----------+----------+--------------+  CFV                  Yes       Yes                                    +----+---------------+---------+-----------+----------+--------------+     Summary: RIGHT: - There is no evidence of deep vein thrombosis in the lower extremity.  - No cystic structure found in the popliteal fossa.  LEFT: - No evidence of common femoral vein obstruction.  *See table(s) above for measurements and observations.    Preliminary     Scheduled Meds:  atorvastatin  20 mg Oral Daily   diltiazem  240 mg Oral Daily   DULoxetine  40 mg Oral Daily   ferrous sulfate  325 mg Oral BID WC   gabapentin  200 mg Oral QHS   insulin  aspart  0-15 Units Subcutaneous TID WC   insulin aspart  0-5 Units Subcutaneous QHS   losartan  100 mg Oral Daily   metoprolol tartrate  50 mg Oral BID   pantoprazole  40 mg Oral Daily   Prednisol Ace-Moxiflox-Bromfen  1 drop Right Eye QID   vitamin B-12  1,000 mcg Oral Daily    Continuous Infusions:  ceFEPime (MAXIPIME) IV Stopped (08/15/20 1041)   vancomycin Stopped (08/15/20 1001)     LOS: 2 days   Time spent: 35 minutes.  Lorella Nimrod, MD [facility]  If 7PM-7AM, please contact night-coverage [email or web address]  08/15/2020, 2:56 PM   This record has been created using Systems analyst. Errors have been sought and corrected,but may not always be located. Such creation errors do not reflect on the standard of care.

## 2020-08-15 NOTE — Progress Notes (Signed)
OT Cancellation Note  Patient Details Name: [name] MRN: [id number] DOB: Mar 12, 1946   Cancelled Treatment:    Reason Eval/Treat Not Completed: Patient not medically ready Per RN, pt having ultrasound on LE to rule out DVT. Will follow up as time allows and pt is appropriate.   [facility] OTR/L Acute Rehabilitation Services Office: [place] 08/15/2020, 9:51 AM

## 2020-08-15 NOTE — Progress Notes (Signed)
Alerted MD Amin, MEWS was yellow overnight. Patient had elevated fever, and low b/p. Started 500 ml LR bolus, will continue to monitor temp. Patient is alert and oriented.

## 2020-08-15 NOTE — Plan of Care (Signed)

## 2020-08-15 NOTE — Evaluation (Signed)
Occupational Therapy Evaluation Patient Details Name: [name] MRN: [id number] DOB: 1946-08-31 Today's Date: 08/15/2020    History of Present Illness Pt is a 74 y.o. female admitted 08/12/20 with c/o lethargy, weakness and poor appetite that started after RLE blister burst 1-2 wks prior; pt also with recent fall. Workup for sepsis secondary to cellulitis. (-) BLE DVT. PMH includes GERD, HTN, HLD, OA, PE, chronic venous insufficiency.   Clinical Impression   Pt negative for RLE DVT. PTA, pt reports she was modified independent with functional mobility at rollator level, she reports she is limited in community mobility and was mostly independent with ADL/IADL. Upon OT arrival, pt sitting in recliner completing full body washup after setup assistance in sitting. She required minA for posterior care. Pt required minguard for stand-pivot transfer to return back to bed. She required modA to return to supine. Pt will continue to benefit from skilled OT services to maximize safety and independence with ADL/IADL and functional mobility. Will continue to follow acutely and progress as tolerated. Currently recommend HHOT with support from family at d/c.   Skin integrity: noted 2 blister areas on left butt cheeck;noted boil/blister on medial/posterior aspect of Rknee;redness and swelling RLE appeared to travel higher posterior RLE than anterior RLE;RN/NT aware and present to observe areas    Follow Up Recommendations  Home health OT;Supervision/Assistance - 24 hour    Equipment Recommendations  3 in 1 bedside commode    Recommendations for Other Services       Precautions / Restrictions Precautions Precautions: Fall      Mobility Bed Mobility Overal bed mobility: Needs Assistance Bed Mobility: Sit to Supine       Sit to supine: Mod assist   General bed mobility comments: modA for BLE management     Transfers Overall transfer level: Needs assistance Equipment used: Rolling  walker (2 wheeled) Transfers: Sit to/from Stand Sit to Stand: Min guard         General transfer comment: minguard for multiple sit<>stand during bathing and for NT/RN to observe pt's posterior RLE    Balance Overall balance assessment: Needs assistance Sitting-balance support: No upper extremity supported;Feet supported;Feet unsupported Sitting balance-Leahy Scale: Fair       Standing balance-Leahy Scale: Fair Standing balance comment: can static stand without UE support but demonstrates preference for heavy reliance on BUE support for pain management and weight shifting                           ADL either performed or assessed with clinical judgement   ADL Overall ADL's : Needs assistance/impaired Eating/Feeding: Set up;Sitting   Grooming: Set up;Sitting   Upper Body Bathing: Set up;Sitting Upper Body Bathing Details (indicate cue type and reason): completed sitting in recliner Lower Body Bathing: Min guard;Sit to/from stand   Upper Body Dressing : Set up;Sitting   Lower Body Dressing: Minimal assistance Lower Body Dressing Details (indicate cue type and reason): to doff socks Toilet Transfer: Min Designer, jewellery Details (indicate cue type and reason): simulated return to EOB from recliner Toileting- Clothing Manipulation and Hygiene: Minimal assistance;Sit to/from stand Toileting - Clothing Manipulation Details (indicate cue type and reason): minA for posterior care     Functional mobility during ADLs: Min guard;Rolling walker General ADL Comments: pt limited this session secondary to fatigue and pain in RLE;pt completed full body bathing while seated in recliner after setupA;she required minA for posterior care     Vision  Perception     Praxis      Pertinent Vitals/Pain Pain Assessment: Faces Faces Pain Scale: Hurts little more Pain Location: RLE Pain Descriptors / Indicators: Guarding;Heaviness Pain Intervention(s):  Monitored during session;Limited activity within patient's tolerance     Hand Dominance Right   Extremity/Trunk Assessment Upper Extremity Assessment Upper Extremity Assessment: Generalized weakness   Lower Extremity Assessment Lower Extremity Assessment: RLE deficits/detail RLE Deficits / Details: RLE cellulitis with significant redness/swelling;noted blistering area posterior/medial area knee/calf;RN and NT aware  RLE: Unable to fully assess due to pain RLE Coordination: decreased gross motor       Communication Communication Communication: No difficulties   Cognition Arousal/Alertness: Awake/alert Behavior During Therapy: WFL for tasks assessed/performed Overall Cognitive Status: Within Functional Limits for tasks assessed                                 General Comments: pt prompted by nutrition for her name/dob, pt required increased time for processing to verbalize dob;did not formally assess, pt would benefit from formal assessment;unsure it pt distracted by returning to bed/fatigue;held off on cognitive assesment per pt request to eat   General Comments  noted 2 blistered areas on left butt cheeck;noted boil/blister on medial/posterior aspect of Rknee;redness and swelling RLE appeared to travel higher posterior RLE than anterior RLE;RN/NT aware and present to observe areas    Exercises Exercises: Other exercises Other Exercises Other Exercises: educated pt on importance of OOB mobility and general BLE/BUE HEP to complete 3x/day   Shoulder Instructions      Home Living Family/patient expects to be discharged to:: Private residence Living Arrangements: Alone Available Help at Discharge: Family;Available 24 hours/day Type of Home: House Home Access: Ramped entrance     Home Layout: One level     Bathroom Shower/Tub: Occupational psychologist: Standard     Home Equipment: Environmental consultant - 2 wheels;Walker - 4 wheels;Hand held shower head;Shower  seat;Wheelchair - manual   Additional Comments: Reports daughter, son and multiple other family members live nearby; son can assist for initial few days upon return home (he's not currently working)      Prior Functioning/Environment Level of Independence: Independent with assistive device(s)        Comments: Mod indep with intermittent use of rollator; sits on seat in walk in shower to bathe; family drives; enjoys shopping, but limited community ambulator        OT Problem List: Decreased activity tolerance;Impaired balance (sitting and/or standing);Decreased safety awareness;Decreased knowledge of use of DME or AE;Obesity;Pain;Increased edema      OT Treatment/Interventions: Self-care/ADL training;Therapeutic exercise;Energy conservation;DME and/or AE instruction;Therapeutic activities;Patient/family education;Balance training    OT Goals(Current goals can be found in the care plan section) Acute Rehab OT Goals Patient Stated Goal: Return home with son who can help OT Goal Formulation: With patient Time For Goal Achievement: 08/29/20 Potential to Achieve Goals: Good ADL Goals Pt Will Perform Grooming: with modified independence;standing Pt Will Perform Lower Body Dressing: with modified independence;sit to/from stand Pt Will Transfer to Toilet: with modified independence;ambulating Additional ADL Goal #1: Pt will demonstrate independence of 3 energy conservation strategies during ADL/IADL and functional mobility.  OT Frequency: Min 2X/week   Barriers to D/C:            Co-evaluation              AM-PAC OT "6 Clicks" Daily Activity     Outcome  Measure Help from another person eating meals?: None Help from another person taking care of personal grooming?: A Little Help from another person toileting, which includes using toliet, bedpan, or urinal?: A Little Help from another person bathing (including washing, rinsing, drying)?: A Little Help from another person to put  on and taking off regular upper body clothing?: A Little Help from another person to put on and taking off regular lower body clothing?: A Little 6 Click Score: 19   End of Session Equipment Utilized During Treatment: Gait belt;Rolling walker Nurse Communication: Mobility status;Other (comment) (skin integrity )  Activity Tolerance: Patient limited by pain;Patient limited by fatigue Patient left: in bed;with call bell/phone within reach  OT Visit Diagnosis: Unsteadiness on feet (R26.81);Other abnormalities of gait and mobility (R26.89);Muscle weakness (generalized) (M62.81);Pain Pain - Right/Left: Right Pain - part of body: Leg                Time: 0263-7858 OT Time Calculation (min): 28 min Charges:  OT General Charges $OT Visit: 1 Visit OT Evaluation $OT Eval Moderate Complexity: 1 Mod OT Treatments $Self Care/Home Management : 8-22 mins  Helene Kelp OTR/L Acute Rehabilitation Services Office: [phone]   NOMVEH M [id number] 08/15/2020, 1:40 PM

## 2020-08-15 NOTE — Progress Notes (Signed)
Patient area of cellulitis to right leg appears to be spreading and extends to top of patient thigh in the front and the back.Patient has also formed serous filled blister to posterior right calf, and posterior left thigh blister as well, foams have been placed. Patient has no urine output on shift, bladder scan completed and resulted 260ml. MD Reesa Chew alerted.

## 2020-08-15 NOTE — Progress Notes (Addendum)
Olvera is having a temp of 101.7 and Bp is 98/55 pt was given tylenol. Reassess pt 101.9 Bp is 101/54 provider S. Chanter,MD was notified ,  MD recommend 1 time does of Advil.

## 2020-08-16 DIAGNOSIS — L0390 Cellulitis, unspecified: Secondary | ICD-10-CM | POA: Diagnosis not present

## 2020-08-16 DIAGNOSIS — A419 Sepsis, unspecified organism: Secondary | ICD-10-CM | POA: Diagnosis not present

## 2020-08-16 LAB — AEROBIC CULTURE W GRAM STAIN (SUPERFICIAL SPECIMEN)

## 2020-08-16 LAB — GLUCOSE, CAPILLARY
Glucose-Capillary: 106 mg/dL — ABNORMAL HIGH (ref 70–99)
Glucose-Capillary: 123 mg/dL — ABNORMAL HIGH (ref 70–99)
Glucose-Capillary: 109 mg/dL — ABNORMAL HIGH (ref 70–99)
Glucose-Capillary: 120 mg/dL — ABNORMAL HIGH (ref 70–99)

## 2020-08-16 LAB — BASIC METABOLIC PANEL
Anion gap: 13 (ref 5–15)
BUN: 23 mg/dL (ref 8–23)
CO2: 19 mmol/L — ABNORMAL LOW (ref 22–32)
Calcium: 8.6 mg/dL — ABNORMAL LOW (ref 8.9–10.3)
Chloride: 103 mmol/L (ref 98–111)
Creatinine, Ser: 1.35 mg/dL — ABNORMAL HIGH (ref 0.44–1.00)
GFR, Estimated: 41 mL/min — ABNORMAL LOW (ref >60)
Glucose, Bld: 129 mg/dL — ABNORMAL HIGH (ref 70–99)
Potassium: 4.8 mmol/L (ref 3.5–5.1)
Sodium: 135 mmol/L (ref 135–145)

## 2020-08-16 LAB — CBC
HCT: 30.1 % — ABNORMAL LOW (ref 36.0–46.0)
Hemoglobin: 8.8 g/dL — ABNORMAL LOW (ref 12.0–15.0)
MCH: 22.8 pg — ABNORMAL LOW (ref 26.0–34.0)
MCHC: 29.2 g/dL — ABNORMAL LOW (ref 30.0–36.0)
MCV: 78 fL — ABNORMAL LOW (ref 80.0–100.0)
Platelets: 373 10*3/uL (ref 150–400)
RBC: 3.86 MIL/uL — ABNORMAL LOW (ref 3.87–5.11)
RDW: 22.4 % — ABNORMAL HIGH (ref 11.5–15.5)
WBC: 31.3 10*3/uL — ABNORMAL HIGH (ref 4.0–10.5)
nRBC: 0 % (ref 0.0–0.2)

## 2020-08-16 MED ORDER — IPRATROPIUM-ALBUTEROL 0.5-2.5 (3) MG/3ML IN SOLN
3.0000 mL | RESPIRATORY_TRACT | Status: DC | PRN
  Administered 2020-08-16: 3 mL via RESPIRATORY_TRACT
  Filled 2020-08-16: qty 3

## 2020-08-16 MED ORDER — GUAIFENESIN 100 MG/5ML PO SOLN
5.0000 mL | ORAL | Status: DC | PRN
  Administered 2020-08-16: 100 mg via ORAL
  Filled 2020-08-16: qty 5

## 2020-08-16 MED ORDER — SODIUM CHLORIDE 0.9 % IV SOLN
2.0000 g | Freq: Two times a day (BID) | INTRAVENOUS | Status: DC
  Administered 2020-08-16 – 2020-08-19 (×6): 2 g via INTRAVENOUS
  Filled 2020-08-16 (×7): qty 2

## 2020-08-16 NOTE — Progress Notes (Signed)
Physical Therapy Treatment Patient Details Name: [name] MRN: [id number] DOB: 11-18-1945 Today's Date: 08/16/2020    History of Present Illness Pt is a 74 y.o. female admitted 08/12/20 with c/o lethargy, weakness and poor appetite that started after RLE blister burst 1-2 wks prior; pt also with recent fall. Workup for sepsis secondary to cellulitis. (-) BLE DVT. PMH includes GERD, HTN, HLD, OA, PE, chronic venous insufficiency.    PT Comments    Patient reports feeling weaker today and also with more in pain in RLE. Requires Mod A to get to EOB today with increased time and cues. Pt has difficulty standing from EOB despite cues for use of momentum and not able to get fully upright leaning on RW handles on forearms. Requires Min A to take a few steps to get to chair with Min A for RW management, weight shifting and support. Limited in mobility today due to weakness and pain. Will follow.   Follow Up Recommendations  Home health PT;Supervision for mobility/OOB (declined SNF)     Equipment Recommendations  Other (comment) (TBA)    Recommendations for Other Services       Precautions / Restrictions Precautions Precautions: Fall Precaution Comments: swelling/erythema/warmth RLE Restrictions Weight Bearing Restrictions: No    Mobility  Bed Mobility Overal bed mobility: Needs Assistance Bed Mobility: Supine to Sit     Supine to sit: Mod assist;HOB elevated     General bed mobility comments: Assist with RLE and scooting bottom to EOB, increased time and cues.  Transfers Overall transfer level: Needs assistance Equipment used: Rolling walker (2 wheeled) Transfers: Sit to/from [facility] Sit to Stand: Mod assist Stand pivot transfers: Min assist       General transfer comment: Multiple attempts and use of momentum to stand from EOB; Mod A to power up with pt leaning on RW handles on forearms for support. not able to stand upright today.SPT bed to  chair with increase time,a ssist with RW management and weight shifting. Very slow, crepitus in bil knees.  Ambulation/Gait             General Gait Details: Unable to today as pt with difficulty taking a few steps to the chair with max cues and increased time.   Stairs             Wheelchair Mobility    Modified Rankin (Stroke Patients Only)       Balance Overall balance assessment: Needs assistance Sitting-balance support: Feet supported;No upper extremity supported Sitting balance-Leahy Scale: Fair     Standing balance support: During functional activity Standing balance-Leahy Scale: Poor Standing balance comment: Pt leaning on forearms on RW handles and not able to stand upright today.                            Cognition Arousal/Alertness: Awake/alert Behavior During Therapy: WFL for tasks assessed/performed Overall Cognitive Status: No family/caregiver present to determine baseline cognitive functioning                                 General Comments: Appears WFL for basic mobility tasks. Slow processing at times.      Exercises      General Comments        Pertinent Vitals/Pain Pain Assessment: Faces Faces Pain Scale: Hurts little more Pain Location: RLE Pain Descriptors / Indicators: Sore;Tender Pain Intervention(s): Monitored  during session;Repositioned;Premedicated before session;Limited activity within patient's tolerance    Home Living                      Prior Function            PT Goals (current goals can now be found in the care plan section) Progress towards PT goals: Not progressing toward goals - comment (weakness/pain today)    Frequency    Min 3X/week      PT Plan Current plan remains appropriate    Co-evaluation              AM-PAC PT "6 Clicks" Mobility   Outcome Measure  Help needed turning from your back to your side while in a flat bed without using bedrails?: A  Little Help needed moving from lying on your back to sitting on the side of a flat bed without using bedrails?: A Lot Help needed moving to and from a bed to a chair (including a wheelchair)?: A Little Help needed standing up from a chair using your arms (e.g., wheelchair or bedside chair)?: A Lot Help needed to walk in hospital room?: A Lot Help needed climbing 3-5 steps with a railing? : Total 6 Click Score: 13    End of Session   Activity Tolerance: Patient limited by pain;Patient limited by fatigue Patient left: in chair;with call bell/phone within reach Nurse Communication: Mobility status PT Visit Diagnosis: Other abnormalities of gait and mobility (R26.89);Pain Pain - Right/Left: Right Pain - part of body: Leg     Time: 0840-0900 PT Time Calculation (min) (ACUTE ONLY): 20 min  Charges:  $Therapeutic Activity: 8-22 mins                     Marisa Severin, PT, DPT Acute Rehabilitation Services Pager [phone] Office [phone]       [name] 08/16/2020, 10:26 AM

## 2020-08-16 NOTE — Progress Notes (Addendum)
PROGRESS NOTE    Jacquelyne Quarry  FAO:[id number] DOB: [birth year] DOA: 08/12/2020 PCP: Biagio Borg, MD Outpatient Specialists:    Brief Narrative:   LOS 3 days  Presented to ED with  Chief Complaint  Patient presents with  . Headache  . Dizziness   ER course  1-2 weeks prior, a blister on the anterior surface of her right leghad burst.In the days following, patient began to develop some drainage from this area with surrounding redness and swelling.  Later developed worsening generalized weakness and lethargy.  Poor appetite and lightheadedness. Admitted 08/12/20 with sepsis secondary to cellulitis.   Initially started on ceftriaxone which was switched to vancomycin according to protocol. Patient also has an hemoglobin of 6.8 on presentation without any obvious bleeding.  Received 1 unit of PRBC.  Today 08/16/20: patient sleepy but rousable, reports leg discomfort but pain is controlled; leg examined, erythema extending to but not past the marker border drawn yesterday    Assessment & Plan:   Principal Problem:   Sepsis due to cellulitis [facility]) Active Problems:   Essential hypertension   Mixed hyperlipidemia   Cellulitis of right lower extremity   Injury of back due to fall   Microcytic anemia   Lactic acidosis   Sepsis ([place])   Sepsis secondary to cellulitis POA.  Patient initially met sepsis criteria with fever, tachycardia, tachypnea and leukocytosis along with lactic acidosis, which resolved. Blood and urine cultures negative.  Wound cultures pending. Patient with worsening and expansion of her right lower extremity cellulitis.  Venous Doppler negative for DVT.  Worsening leukocytosis and become febrile with temperature of 101.9 yesterday Patient received IV fluid and broad-spectrum antibiotics according to protocol. Antibiotic switched to vancomycin for severe purulent cellulitis, added cefepime. -Skin was marked with sharpie to see if there is more expansion,  today was close to border but was ok. -wound Cx (+)staph epidermidis, sensitive to vancomycin -wound Cx (+)corynebacterium should be susceptible to vancomycin, initially d/c cefepime but pt concerned about spreading erythema, will restart  -low threshold to consult ID   Microcytic anemia.  On presentation her hemoglobin was 6.8 without any obvious bleeding.  No blood in the rectal vault for Hemoccult testing in ED. FOBT order-still pending.  She received 1 unit of PRBC. Hemoglobin improved to 8.3 today, seems stable.  Anemia panel with iron deficiency anemia and B12 within lower normal limit of 205. Patient has an history of lower GI bleed in 2016 and at that time endoscopy/capsule endoscopy and colonoscopy was done which was positive for diverticulosis and hemorrhoids.  Pathology was negative for any malignancy.  They were recommending outpatient banding, not sure whether it was done or not. -Continue with p.o. supplement. -She will need outpatient GI evaluation.  Essential hypertension.  Blood pressure mostly within goal but did become little soft at time. -Continue home antihypertensives-which include metoprolol, Cardizem and losartan. -Diuretic is being held secondary to sepsis. -Can restart diuretics if volume status worsens.  Back pain with recent fall at home.  Patient has an history of recent fall.  Imaging negative for any acute fracture. -PT/OT evaluation.  Hyperlipidemia. -Continue home statin.  GERD.  No obvious GI bleed.  Discontinue IV Protonix. -Continue with oral Protonix.    DVT prophylaxis: SCDs Code Status: DNR Family Communication: Discussed with patient Disposition Plan:  Status is: Inpatient    Consultants:   None  Procedures:  None   Antimicrobials:  Anti-infectives (From admission, onward)   Start  Dose/Rate Route Frequency Ordered Stop   08/15/20 1000  ceFEPIme (MAXIPIME) 2 g in sodium chloride 0.9 % 100 mL IVPB        2 g 200 mL/hr  over 30 Minutes Intravenous Every 12 hours 08/15/20 0834     08/13/20 2000  vancomycin (VANCOREADY) IVPB 750 mg/150 mL        750 mg 150 mL/hr over 60 Minutes Intravenous Every 12 hours 08/13/20 0336     08/13/20 0245  vancomycin (VANCOCIN) IVPB 1000 mg/200 mL premix        1,000 mg 200 mL/hr over 60 Minutes Intravenous  Once 08/13/20 0242 08/13/20 0609   08/12/20 2030  cefTRIAXone (ROCEPHIN) 2 g in sodium chloride 0.9 % 100 mL IVPB  Status:  Discontinued        2 g 200 mL/hr over 30 Minutes Intravenous Every 24 hours 08/12/20 2016 08/13/20 0242       Subjective: Patient reports feeling uncomfortable but pain is controlled. No fever/chills.     Objective: Vitals:   08/15/20 1055 08/15/20 2114 08/16/20 0527 08/16/20 0837  BP: 100/60 (!) 114/55 (!) 107/59 102/86  Pulse: 80 96 96 95  Resp: 19  16 20   Temp: 97.8 F (36.6 C) 97.9 F (36.6 C) 98.5 F (36.9 C) 99.9 F (37.7 C)  TempSrc: Oral Oral Oral Oral  SpO2: 100%  97% 96%  Weight:      Height:        Intake/Output Summary (Last 24 hours) at 08/16/2020 1530 Last data filed at 08/16/2020 0300 Gross per 24 hour  Intake --  Output 550 ml  Net -550 ml   Filed Weights   08/12/20 2156  Weight: 98.4 kg    Examination:  General exam: Appears calm and comfortable  Respiratory system: Clear to auscultation. Respiratory effort normal. Cardiovascular system: S1 & S2 heard, RRR. No JVD, murmurs, rubs, gallops or clicks. No pedal edema. Gastrointestinal system: Abdomen is nondistended, soft and nontender. No organomegaly or masses felt. Normal bowel sounds heard. Central nervous system: Alert and oriented. No focal neurological deficits. Extremities: Symmetric 5 x 5 power. Skin: No rashes, lesions or ulcers Psychiatry: Judgement and insight appear normal. Mood & affect appropriate.     Data Reviewed: I have personally reviewed following labs and imaging studies  CBC: Recent Labs  Lab 08/12/20 2014 08/12/20 2014  08/13/20 0435 08/13/20 0435 08/13/20 1807 08/14/20 0201 08/14/20 0725 08/15/20 0359 08/16/20 0211  WBC 34.6*   < > 29.6*  --  28.1*  --  26.8* 28.7* 31.3*  NEUTROABS 31.9*  --   --   --   --   --   --   --   --   HGB 6.8*   < > 7.4*   < > 7.1* 8.0* 8.3* 8.3* 8.8*  HCT 24.1*   < > 24.6*   < > 24.5* 26.4* 28.0* 27.7* 30.1*  MCV 71.3*   < > 74.1*  --  74.0*  --  76.1* 75.5* 78.0*  PLT 542*   < > 458*  --  399  --  376 389 373   < > = values in this interval not displayed.   Basic Metabolic Panel: Recent Labs  Lab 08/12/20 2014 08/13/20 0435 08/14/20 0550 08/15/20 0359 08/16/20 0211  NA 135 135 137 134* 135  K 4.0 3.7 3.7 3.8 4.8  CL 99 101 104 104 103  CO2 24 22 24  20* 19*  GLUCOSE 121* 104* 143* 141* 129*  BUN 23 16 12 15 23   CREATININE 1.09* 1.01* 1.03* 1.08* 1.35*  CALCIUM 9.2 8.7* 8.9 8.6* 8.6*  MG  --  1.8 1.9  --   --    GFR: Estimated Creatinine Clearance: 41.5 mL/min (A) (by C-G formula based on SCr of 1.35 mg/dL (H)). Liver Function Tests: Recent Labs  Lab 08/12/20 2014 08/13/20 0435 08/14/20 0550  AST 19 18 15   ALT 15 15 15   ALKPHOS 86 82 85  BILITOT 1.0 1.3* 0.7  PROT 6.5 5.5* 5.6*  ALBUMIN 3.5 2.8* 2.5*   No results for input(s): LIPASE, AMYLASE in the last 168 hours. No results for input(s): AMMONIA in the last 168 hours. Coagulation Profile: Recent Labs  Lab 08/12/20 2014 08/13/20 0435  INR 1.1 1.2   Cardiac Enzymes: No results for input(s): CKTOTAL, CKMB, CKMBINDEX, TROPONINI in the last 168 hours. BNP (last 3 results) Recent Labs    12/11/19 1105  PROBNP 26.0   HbA1C: No results for input(s): HGBA1C in the last 72 hours. CBG: Recent Labs  Lab 08/15/20 1150 08/15/20 1548 08/15/20 2041 08/16/20 0707 08/16/20 1209  GLUCAP 137* 126* 118* 106* 123*   Lipid Profile: No results for input(s): CHOL, HDL, LDLCALC, TRIG, CHOLHDL, LDLDIRECT in the last 72 hours. Thyroid Function Tests: No results for input(s): TSH, T4TOTAL, FREET4,  T3FREE, THYROIDAB in the last 72 hours. Anemia Panel: No results for input(s): VITAMINB12, FOLATE, FERRITIN, TIBC, IRON, RETICCTPCT in the last 72 hours. Urine analysis:    Component Value Date/Time   COLORURINE YELLOW 08/13/2020 0012   APPEARANCEUR CLEAR 08/13/2020 0012   LABSPEC 1.014 08/13/2020 0012   PHURINE 7.0 08/13/2020 0012   GLUCOSEU NEGATIVE 08/13/2020 0012   GLUCOSEU NEGATIVE 02/06/2020 1039   HGBUR NEGATIVE 08/13/2020 0012   BILIRUBINUR NEGATIVE 08/13/2020 0012   BILIRUBINUR neg 03/09/2017 1759   KETONESUR NEGATIVE 08/13/2020 0012   PROTEINUR NEGATIVE 08/13/2020 0012   UROBILINOGEN 0.2 02/06/2020 1039   NITRITE NEGATIVE 08/13/2020 0012   LEUKOCYTESUR NEGATIVE 08/13/2020 0012   Sepsis Labs: @LABRCNTIP (procalcitonin:4,lacticidven:4)  Recent Results (from the past 240 hour(s))  Respiratory Panel by RT PCR (Flu A&B, Covid) - Nasopharyngeal Swab     Status: None   Collection Time: 08/12/20  8:16 PM   Specimen: Nasopharyngeal Swab  Result Value Ref Range Status   SARS Coronavirus 2 by RT PCR NEGATIVE NEGATIVE Final    Comment: (NOTE) SARS-CoV-2 target nucleic acids are NOT DETECTED.  The SARS-CoV-2 RNA is generally detectable in upper respiratoy specimens during the acute phase of infection. The lowest concentration of SARS-CoV-2 viral copies this assay can detect is 131 copies/mL. A negative result does not preclude SARS-Cov-2 infection and should not be used as the sole basis for treatment or other patient management decisions. A negative result may occur with  improper specimen collection/handling, submission of specimen other than nasopharyngeal swab, presence of viral mutation(s) within the areas targeted by this assay, and inadequate number of viral copies (<131 copies/mL). A negative result must be combined with clinical observations, patient history, and epidemiological information. The expected result is Negative.  Fact Sheet for Patients:   PinkCheek.be  Fact Sheet for Healthcare Providers:  GravelBags.it  This test is no t yet approved or cleared by the [place] FDA and  has been authorized for detection and/or diagnosis of SARS-CoV-2 by FDA under an Emergency Use Authorization (EUA). This EUA will remain  in effect (meaning this test can be used) for the duration of the COVID-19 declaration under Section 564(b)(1)  of the Act, 21 U.S.C. section 360bbb-3(b)(1), unless the authorization is terminated or revoked sooner.     Influenza A by PCR NEGATIVE NEGATIVE Final   Influenza B by PCR NEGATIVE NEGATIVE Final    Comment: (NOTE) The Xpert Xpress SARS-CoV-2/FLU/RSV assay is intended as an aid in  the diagnosis of influenza from Nasopharyngeal swab specimens and  should not be used as a sole basis for treatment. Nasal washings and  aspirates are unacceptable for Xpert Xpress SARS-CoV-2/FLU/RSV  testing.  Fact Sheet for Patients: PinkCheek.be  Fact Sheet for Healthcare Providers: GravelBags.it  This test is not yet approved or cleared by the [place] FDA and  has been authorized for detection and/or diagnosis of SARS-CoV-2 by  FDA under an Emergency Use Authorization (EUA). This EUA will remain  in effect (meaning this test can be used) for the duration of the  Covid-19 declaration under Section 564(b)(1) of the Act, 21  U.S.C. section 360bbb-3(b)(1), unless the authorization is  terminated or revoked. Performed at [facility], [place] [address], [place], [place] [address]   Blood Culture (routine x 2)     Status: None (Preliminary result)   Collection Time: 08/12/20  8:18 PM   Specimen: BLOOD  Result Value Ref Range Status   Specimen Description BLOOD LEFT ANTECUBITAL  Final   Special Requests   Final    BOTTLES DRAWN AEROBIC AND ANAEROBIC Blood Culture adequate volume   Culture    Final    NO GROWTH 4 DAYS Performed at [facility], [place] [address], [place], [place] [address]    Report Status PENDING  Incomplete  Blood Culture (routine x 2)     Status: None (Preliminary result)   Collection Time: 08/12/20  8:20 PM   Specimen: BLOOD  Result Value Ref Range Status   Specimen Description BLOOD RIGHT ANTECUBITAL  Final   Special Requests   Final    BOTTLES DRAWN AEROBIC AND ANAEROBIC Blood Culture results may not be optimal due to an excessive volume of blood received in culture bottles   Culture   Final    NO GROWTH 4 DAYS Performed at [facility], [place] [address], [place], [place] [address]    Report Status PENDING  Incomplete  Urine culture     Status: Abnormal   Collection Time: 08/13/20 12:11 AM   Specimen: In/Out Cath Urine  Result Value Ref Range Status   Specimen Description IN/OUT CATH URINE  Final   Special Requests NONE  Final   Culture (A)  Final    <10,000 COLONIES/mL INSIGNIFICANT GROWTH Performed at [facility] [address] [place], [place] [address]    Report Status 08/14/2020 FINAL  Final  Aerobic Culture (superficial specimen)     Status: None   Collection Time: 08/13/20  2:43 AM   Specimen: Wound  Result Value Ref Range Status   Specimen Description WOUND RIGHT LOWER LEG  Final   Special Requests NONE  Final   Gram Stain   Final    RARE WBC PRESENT, PREDOMINANTLY PMN NO ORGANISMS SEEN Performed at [facility], [place] [address] [place], [place] [address]    Culture   Final    MODERATE CORYNEBACTERIUM PSEUDODIPHTHERIAE Standardized susceptibility testing for this organism is not available. RARE STAPHYLOCOCCUS EPIDERMIDIS    Report Status 08/16/2020 FINAL  Final   Organism ID, Bacteria STAPHYLOCOCCUS EPIDERMIDIS  Final      Susceptibility   Staphylococcus epidermidis - MIC*  CIPROFLOXACIN >=8 RESISTANT Resistant     ERYTHROMYCIN >=8 RESISTANT Resistant     GENTAMICIN <=0.5 SENSITIVE Sensitive      OXACILLIN >=4 RESISTANT Resistant     TETRACYCLINE >=16 RESISTANT Resistant     VANCOMYCIN 1 SENSITIVE Sensitive     TRIMETH/SULFA <=10 SENSITIVE Sensitive     CLINDAMYCIN <=0.25 SENSITIVE Sensitive     RIFAMPIN <=0.5 SENSITIVE Sensitive     Inducible Clindamycin NEGATIVE Sensitive     * RARE STAPHYLOCOCCUS EPIDERMIDIS         Radiology Studies last 96 hours: DG Chest 1 View  Result Date: 08/12/2020 CLINICAL DATA:  Headache post fall EXAM: CHEST  1 VIEW COMPARISON:  04/12/2019 FINDINGS: The heart size and mediastinal contours are within normal limits. Aortic atherosclerosis. Both lungs are clear. The visualized skeletal structures are unremarkable. IMPRESSION: No active disease. Electronically Signed   By: Donavan Foil M.D.   On: 08/12/2020 21:12   DG Lumbar Spine 2-3 Views  Result Date: 08/13/2020 CLINICAL DATA:  Fall.  Dizziness. EXAM: LUMBAR SPINE - 2-3 VIEW COMPARISON:  Lumbar spine radiographs 11/10/2017 FINDINGS: Grade 1 anterolisthesis is present at L3-4 at L4-5 without significant interval change. Remote superior endplate fracture at L1 is noted. Vertebral body heights are otherwise normal. No acute fractures are present. Vascular calcifications are noted in the aorta. Calcified uterine fibroid is stable. Visualized pelvis is unremarkable. IMPRESSION: 1. No acute abnormality or significant interval change. 2. Stable grade 1 anterolisthesis at L3-4 and L4-5. 3. Remote superior endplate fracture at L1. Electronically Signed   By: San Morelle M.D.   On: 08/13/2020 05:50   DG Tibia/Fibula Right  Result Date: 08/12/2020 CLINICAL DATA:  Right leg pain after fall EXAM: RIGHT TIBIA AND FIBULA - 2 VIEW COMPARISON:  05/22/2014 FINDINGS: Right knee replacement. No acute displaced fracture or malalignment. Diffuse subcutaneous edema IMPRESSION: No acute osseous abnormality. Electronically Signed   By: Donavan Foil M.D.   On: 08/12/2020 21:13   VAS [place] LOWER EXTREMITY VENOUS  (DVT)  Result Date: 08/15/2020  Lower Venous DVTStudy Indications: Pain, Swelling, Edema, and Erythema.  Risk Factors: Obesity. Performing Technologist: Griffin Basil RCT RDMS  Examination Guidelines: A complete evaluation includes B-mode imaging, spectral Doppler, color Doppler, and power Doppler as needed of all accessible portions of each vessel. Bilateral testing is considered an integral part of a complete examination. Limited examinations for reoccurring indications may be performed as noted. The reflux portion of the exam is performed with the patient in reverse Trendelenburg.  +---------+---------------+---------+-----------+----------+-----------------+ RIGHT    CompressibilityPhasicitySpontaneityPropertiesThrombus Aging    +---------+---------------+---------+-----------+----------+-----------------+ CFV      Full           Yes      Yes                                    +---------+---------------+---------+-----------+----------+-----------------+ SFJ      Full                                                           +---------+---------------+---------+-----------+----------+-----------------+ FV Prox  Full                                                           +---------+---------------+---------+-----------+----------+-----------------+  FV Mid   Full                                                           +---------+---------------+---------+-----------+----------+-----------------+ FV Distal               Yes      Yes                                    +---------+---------------+---------+-----------+----------+-----------------+ PFV      Full                                                           +---------+---------------+---------+-----------+----------+-----------------+ POP                     Yes      Yes                                    +---------+---------------+---------+-----------+----------+-----------------+ PTV                                                    Color images only +---------+---------------+---------+-----------+----------+-----------------+ PERO                                                  Color images only +---------+---------------+---------+-----------+----------+-----------------+ Limited distal leg evaluation due to pain and swelling . Color images only , No compression images due to pain.  +----+---------------+---------+-----------+----------+--------------+ LEFTCompressibilityPhasicitySpontaneityPropertiesThrombus Aging +----+---------------+---------+-----------+----------+--------------+ CFV                Yes      Yes                                 +----+---------------+---------+-----------+----------+--------------+     Summary: RIGHT: - There is no evidence of deep vein thrombosis in the lower extremity.  - No cystic structure found in the popliteal fossa.  LEFT: - No evidence of common femoral vein obstruction.  *See table(s) above for measurements and observations. Electronically signed by Monica Martinez MD on 08/15/2020 at 3:05:10 PM.    Final    VAS [place] LOWER EXTREMITY VENOUS (DVT)  Result Date: 08/13/2020  Lower Venous DVTStudy Indications: Pain, Swelling, and Edema.  Risk Factors: Obesity. Performing Technologist: Griffin Basil RCT RDMS  Examination Guidelines: A complete evaluation includes B-mode imaging, spectral Doppler, color Doppler, and power Doppler as needed of all accessible portions of each vessel. Bilateral testing is considered an integral part of a complete examination. Limited examinations for reoccurring indications may be performed as noted. The reflux portion of the exam is performed with the patient in reverse Trendelenburg.  +---------+---------------+---------+-----------+----------+-------------------+  RIGHT    CompressibilityPhasicitySpontaneityPropertiesThrombus Aging       +---------+---------------+---------+-----------+----------+-------------------+ CFV      Full           Yes      Yes                                      +---------+---------------+---------+-----------+----------+-------------------+ SFJ      Full                                                             +---------+---------------+---------+-----------+----------+-------------------+ FV Prox  Full                                                             +---------+---------------+---------+-----------+----------+-------------------+ FV Mid   Full                                                             +---------+---------------+---------+-----------+----------+-------------------+ FV DistalFull                                                             +---------+---------------+---------+-----------+----------+-------------------+ PFV      Full                                                             +---------+---------------+---------+-----------+----------+-------------------+ POP      Full           Yes      Yes                                      +---------+---------------+---------+-----------+----------+-------------------+ PTV                                                   Non Visualized due                                                        to Edema - Obesity  and inability to                                                          tolorate probe                                                            placement           +---------+---------------+---------+-----------+----------+-------------------+ PERO                                                  Non Visualized Due                                                        to Edema, Obesity                                                         and inability to                                                           tolorate probe                                                            placement .         +---------+---------------+---------+-----------+----------+-------------------+     Summary: RIGHT: - There is no evidence of deep vein thrombosis in the lower extremity.  - No cystic structure found in the popliteal fossa.  LEFT: - No evidence of common femoral vein obstruction.  *See table(s) above for measurements and observations. Electronically signed by Deitra Mayo MD on 08/13/2020 at 5:40:00 PM.    Final          Scheduled Meds: . atorvastatin  20 mg Oral Daily  . diltiazem  240 mg Oral Daily  . DULoxetine  40 mg Oral Daily  . ferrous sulfate  325 mg Oral BID WC  . gabapentin  200 mg Oral QHS  . insulin aspart  0-15 Units Subcutaneous TID WC  . insulin aspart  0-5 Units Subcutaneous QHS  . losartan  100 mg Oral Daily  . metoprolol tartrate  50 mg Oral BID  . pantoprazole  40 mg Oral Daily  . Prednisol Ace-Moxiflox-Bromfen  1 drop Right Eye  QID  . vitamin B-12  1,000 mcg Oral Daily   Continuous Infusions: . ceFEPime (MAXIPIME) IV 2 g (08/16/20 0834)  . vancomycin 750 mg (08/16/20 0921)     LOS: 3 days    Time spent: 35 min    Emeterio Reeve, DO [facility]  If 7PM-7AM, please contact night-coverage [email or web address] 08/16/2020, 3:30 PM

## 2020-08-16 NOTE — Plan of Care (Signed)
Pt could relate increase spread of cellulitis. Pt is still receiving anitiiotics.

## 2020-08-16 NOTE — Plan of Care (Signed)

## 2020-08-17 ENCOUNTER — Inpatient Hospital Stay (HOSPITAL_COMMUNITY): Payer: Medicare Other

## 2020-08-17 DIAGNOSIS — L0390 Cellulitis, unspecified: Secondary | ICD-10-CM | POA: Diagnosis not present

## 2020-08-17 DIAGNOSIS — A419 Sepsis, unspecified organism: Secondary | ICD-10-CM | POA: Diagnosis not present

## 2020-08-17 LAB — CULTURE, BLOOD (ROUTINE X 2)
Culture: NO GROWTH
Culture: NO GROWTH
Special Requests: ADEQUATE

## 2020-08-17 LAB — GLUCOSE, CAPILLARY
Glucose-Capillary: 90 mg/dL (ref 70–99)
Glucose-Capillary: 143 mg/dL — ABNORMAL HIGH (ref 70–99)
Glucose-Capillary: 121 mg/dL — ABNORMAL HIGH (ref 70–99)
Glucose-Capillary: 150 mg/dL — ABNORMAL HIGH (ref 70–99)

## 2020-08-17 LAB — COMPREHENSIVE METABOLIC PANEL
ALT: 16 U/L (ref 0–44)
AST: 14 U/L — ABNORMAL LOW (ref 15–41)
Albumin: 2.2 g/dL — ABNORMAL LOW (ref 3.5–5.0)
Alkaline Phosphatase: 95 U/L (ref 38–126)
Anion gap: 9 (ref 5–15)
BUN: 33 mg/dL — ABNORMAL HIGH (ref 8–23)
CO2: 21 mmol/L — ABNORMAL LOW (ref 22–32)
Calcium: 8.8 mg/dL — ABNORMAL LOW (ref 8.9–10.3)
Chloride: 107 mmol/L (ref 98–111)
Creatinine, Ser: 1.35 mg/dL — ABNORMAL HIGH (ref 0.44–1.00)
GFR, Estimated: 41 mL/min — ABNORMAL LOW (ref >60)
Glucose, Bld: 128 mg/dL — ABNORMAL HIGH (ref 70–99)
Potassium: 4 mmol/L (ref 3.5–5.1)
Sodium: 137 mmol/L (ref 135–145)
Total Bilirubin: 0.5 mg/dL (ref 0.3–1.2)
Total Protein: 5.7 g/dL — ABNORMAL LOW (ref 6.5–8.1)

## 2020-08-17 LAB — CBC
HCT: 28.7 % — ABNORMAL LOW (ref 36.0–46.0)
HCT: 28.4 % — ABNORMAL LOW (ref 36.0–46.0)
Hemoglobin: 8.6 g/dL — ABNORMAL LOW (ref 12.0–15.0)
Hemoglobin: 8.6 g/dL — ABNORMAL LOW (ref 12.0–15.0)
MCH: 23 pg — ABNORMAL LOW (ref 26.0–34.0)
MCH: 22.7 pg — ABNORMAL LOW (ref 26.0–34.0)
MCHC: 30 g/dL (ref 30.0–36.0)
MCHC: 30.3 g/dL (ref 30.0–36.0)
MCV: 76.7 fL — ABNORMAL LOW (ref 80.0–100.0)
MCV: 74.9 fL — ABNORMAL LOW (ref 80.0–100.0)
Platelets: 405 10*3/uL — ABNORMAL HIGH (ref 150–400)
Platelets: 403 10*3/uL — ABNORMAL HIGH (ref 150–400)
RBC: 3.74 MIL/uL — ABNORMAL LOW (ref 3.87–5.11)
RBC: 3.79 MIL/uL — ABNORMAL LOW (ref 3.87–5.11)
RDW: 23.1 % — ABNORMAL HIGH (ref 11.5–15.5)
RDW: 22.9 % — ABNORMAL HIGH (ref 11.5–15.5)
WBC: 28.7 10*3/uL — ABNORMAL HIGH (ref 4.0–10.5)
WBC: 27.6 10*3/uL — ABNORMAL HIGH (ref 4.0–10.5)
nRBC: 0 % (ref 0.0–0.2)
nRBC: 0 % (ref 0.0–0.2)

## 2020-08-17 LAB — LACTIC ACID, PLASMA: Lactic Acid, Venous: 1.5 mmol/L (ref 0.5–1.9)

## 2020-08-17 LAB — VANCOMYCIN, TROUGH: Vancomycin Tr: 23 ug/mL (ref 15–20)

## 2020-08-17 MED ORDER — ALUM & MAG HYDROXIDE-SIMETH 200-200-20 MG/5ML PO SUSP
5.0000 mL | Freq: Four times a day (QID) | ORAL | Status: DC | PRN
  Filled 2020-08-17: qty 30

## 2020-08-17 MED ORDER — ALUM & MAG HYDROXIDE-SIMETH 200-200-20 MG/5ML PO SUSP
30.0000 mL | Freq: Four times a day (QID) | ORAL | Status: DC | PRN
  Administered 2020-08-17 – 2020-08-19 (×2): 30 mL via ORAL
  Filled 2020-08-17: qty 30

## 2020-08-17 MED ORDER — IOHEXOL 350 MG/ML SOLN
100.0000 mL | Freq: Once | INTRAVENOUS | Status: AC | PRN
  Administered 2020-08-17: 100 mL via INTRAVENOUS

## 2020-08-17 MED ORDER — SODIUM CHLORIDE 0.9 % IV SOLN: INTRAVENOUS | Status: DC

## 2020-08-17 MED ORDER — VANCOMYCIN HCL 1250 MG/250ML IV SOLN
1250.0000 mg | INTRAVENOUS | Status: DC
  Administered 2020-08-18 – 2020-08-19 (×2): 1250 mg via INTRAVENOUS
  Filled 2020-08-17 (×2): qty 250

## 2020-08-17 MED ORDER — SODIUM CHLORIDE 0.9 % IV BOLUS
1000.0000 mL | Freq: Once | INTRAVENOUS | Status: AC
  Administered 2020-08-17: 1000 mL via INTRAVENOUS

## 2020-08-17 NOTE — Progress Notes (Signed)
Pharmacy Antibiotic Note  [name] is a 74 y.o. female admitted on 08/12/2020 with cellulitis.  Pharmacy has been consulted for Vancomycin dosing.   Pt remains afebrile with WBC elevated at 27. Wound culture growing staph epidermidis and corynebacterium. [name] is slightly up today at 1.35.  Added cefepime for spreading erythema.    10/30 VT = 23 was drawn 11 hours from last dose and last dose was given 4 hr late so not at steady state. Will decrease dose and move back to allow for clearance.   Plan: Decrease Vancomycin to 1250 mg IV q24h  Cefepime 2g IV q12h  Monitor fevers, WBC, cultures, and clinical progress Obtain vanc levels as indicated    Height: 5\' 3"  (160 cm) Weight: 98.4 kg (216 lb 14.9 oz) IBW/kg (Calculated) : 52.4  Temp (24hrs), Avg:98 F (36.7 C), Min:97.8 F (36.6 C), Max:98.2 F (36.8 C)  Recent Labs  Lab 08/12/20 2014 08/12/20 2014 08/12/20 2214 08/13/20 0435 08/13/20 1807 08/14/20 0550 08/14/20 0725 08/15/20 0359 08/16/20 0211 08/17/20 0224 08/17/20 1525  WBC 34.6*   < >  --  29.6*   < >  --  26.8* 28.7* 31.3* 28.7* 27.6*  CREATININE 1.09*   < >  --  1.01*  --  1.03*  --  1.08* 1.35*  --  1.35*  LATICACIDVEN 2.5*  --  1.9  --   --   --   --   --   --   --  1.5   < > = values in this interval not displayed.    Estimated Creatinine Clearance: 41.5 mL/min (A) (by C-G formula based on SCr of 1.35 mg/dL (H)).    Allergies  Allergen Reactions  . 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 Sulfonamide Derivatives Itching and Rash   Antimicrobials: 10/25 CTX x 1 10/26 vanc >> 10/28 cefepime >>   Dose adjustments this admission: 10/30 VT 23, change vanc 750mg  Q12 hr to 1250mg  Q24hr  Microbiology: 10/26 UCx - negative  10/26 BCx - ngtd 10/26 R leg wound cx - rare staph epi, mod corynebacterium   Benetta Spar, PharmD, BCPS, BCCP Clinical Pharmacist  Please  check AMION for all [facility] phone numbers After 10:00 PM, call [place] [phone]

## 2020-08-17 NOTE — Progress Notes (Addendum)
PROGRESS NOTE    [name]  XMI:[id number] DOB: August 21, 1946 DOA: 08/12/2020 PCP: Biagio Borg, MD    Brief Narrative:   LOS 4 days  Presented to ED with  Chief Complaint  Patient presents with  . Headache  . Dizziness   ER course  1-2 weeks prior, a blister on the anterior surface of her right leghad burst.In the days following, patient began to develop some drainage from this area with surrounding redness and swelling.  Later developed worsening generalized weakness and lethargy.  Poor appetite and lightheadedness. Admitted 08/12/20 with sepsis secondary to cellulitis.   Initially started on ceftriaxone which was switched to vancomycin according to protocol. Patient also has an hemoglobin of 6.8 on presentation without any obvious bleeding.  Received 1 unit of PRBC.  08/16/20: patient sleepy but rousable, reports leg discomfort but pain is controlled; leg examined, erythema extending to but not past the marker border drawn yesterday  Today 08/17/20: pt alert, no concerns. She reports leg is feeling about the same. On exam earlier in AM, no spread of erythema compared to yesterday. At 2:10 PM I was paged re: hypotension 88/51 and pt reporting dizziness. Confirmed w/ nurse she is not on any IV fluids now. Ordered bolus 1L. Checked in on patient approx 3:00 PM and she reports feeling fine. I looked more closely at her posterior and medial thigh and there are fairly sizable bullae, no obvious area of fluctuance to indicate superficial abscess. Nurse reports this area does seem to be showing ome enlargement of the bullae compared to yesterday. See A/P below. Repeat VS were pending as of 3:03 PM. Repeat BP still low 89/32 around 3:30 (suspect error and asked nurse to recheck manually). Additionally there had been some miscommunication about bolus orders and the 1L was running at only 125 cc/hr so I alerted nurse to BOLUS the fluids and recheck BP. At 4:35 BP was 90/58 manual and  patient was feeling well, sitting up and chatting with visitors. Held PM BP meds (metoprolol) and ordered VS q1h, will also hold AM BP meds (metoprolol, losartan, cardizem).  Repeat CBC shows stable Hgb at 8.6 same as this AM. Renal fxn also stable w/ Cr 1.35 same as yesterday, and liver enzymes not concerning - no lab evidence of hypoperfusion or worsening anemia. Lactic acid was 1.5. SOFA score 2, qSOFA score 1  17:05 -  BP (!) 99/54 (BP Location: Left Arm)   Pulse 78   Temp 98.2 F (36.8 C) (Oral)   Resp 18   Ht 5' 3"  (1.6 m)   Wt 98.4 kg   SpO2 100%   BMI 38.43 kg/m   CBC    Component Value Date/Time   WBC 27.6 (H) 08/17/2020 1525   RBC 3.79 (L) 08/17/2020 1525   HGB 8.6 (L) 08/17/2020 1525   HCT 28.4 (L) 08/17/2020 1525   PLT 403 (H) 08/17/2020 1525   MCV 74.9 (L) 08/17/2020 1525   MCH 22.7 (L) 08/17/2020 1525   MCHC 30.3 08/17/2020 1525   RDW 22.9 (H) 08/17/2020 1525   LYMPHSABS 0.9 08/12/2020 2014   MONOABS 1.3 (H) 08/12/2020 2014   EOSABS 0.0 08/12/2020 2014   BASOSABS 0.1 08/12/2020 2014   CMP     Component Value Date/Time   NA 137 08/17/2020 1525   K 4.0 08/17/2020 1525   CL 107 08/17/2020 1525   CO2 21 (L) 08/17/2020 1525   GLUCOSE 128 (H) 08/17/2020 1525   BUN 33 (H) 08/17/2020 1525  CREATININE 1.35 (H) 08/17/2020 1525   CALCIUM 8.8 (L) 08/17/2020 1525   PROT 5.7 (L) 08/17/2020 1525   ALBUMIN 2.2 (L) 08/17/2020 1525   AST 14 (L) 08/17/2020 1525   ALT 16 08/17/2020 1525   ALKPHOS 95 08/17/2020 1525   BILITOT 0.5 08/17/2020 1525   GFRNONAA 41 (L) 08/17/2020 1525   GFRAA >60 12/18/2019 1540         Assessment & Plan:   Principal Problem:   Sepsis due to cellulitis [facility]) Active Problems:   Essential hypertension   Mixed hyperlipidemia   Cellulitis of right lower extremity   Injury of back due to fall   Microcytic anemia   Lactic acidosis   Sepsis ([place])   Sepsis secondary to cellulitis POA.  Patient initially met sepsis criteria with  fever, tachycardia, tachypnea and leukocytosis along with lactic acidosis, which resolved. Blood and urine cultures negative.  Wound cultures pending. Patient with worsening and expansion of her right lower extremity cellulitis.  Venous Doppler negative for DVT.  Worsening leukocytosis and become febrile with temperature of 101.9 yesterday Patient received IV fluid and broad-spectrum antibiotics according to protocol. Antibiotic switched to vancomycin for severe purulent cellulitis, added cefepime. -Skin was marked with sharpie to see if there is more expansion, today was close to border but was ok. -wound Cx (+)staph epidermidis, sensitive to vancomycin -wound Cx (+)corynebacterium should be susceptible to vancomycin, initially d/c cefepime but pt concerned about spreading erythema, will restart  -bullae on inner R leg are expanding a bit, pt not sure if that area is more painful or not compared to yesterday, I can't appreciate any focal area of fluctuance, but will go ahead and get CT of the leg to evaluate for deeper abscess or if any subq ephysema  -low threshold to consult ID pending imaging may also need ortho   Hypotension Concern given sepsis. Fluid resuscitation initiated, will reassess clinically and addend note. Hgb was stable this AM, will repeat CBC STAT as well as lactic acid, CMP. Pt appears clinically well and feeing improved after bolus fluids, is now on maintenance IV fluids and also encouraged po intake. Eval as above for deeper focal/loculated infection.   Microcytic anemia.  On presentation her hemoglobin was 6.8 without any obvious bleeding.  No blood in the rectal vault for Hemoccult testing in ED. FOBT order-still pending.  She received 1 unit of PRBC. Hemoglobin improved to 8.3.  Anemia panel with iron deficiency anemia and B12 within lower normal limit of 205. Patient has an history of lower GI bleed in 2016 and at that time endoscopy/capsule endoscopy and colonoscopy was  done which was positive for diverticulosis and hemorrhoids. Pathology was negative for any malignancy.  They were recommending outpatient banding, not sure whether it was done or not. -Continue with p.o. supplement. -She will need outpatient GI evaluation.  Essential hypertension.  Blood pressure mostly within goal but hypotension today as above -pending next BP reading will hold home antihypertensives-which include metoprolol, Cardizem and losartan. -Diuretic was being held secondary to sepsis but I think ok to restart if hypotension resolves, given edema a bit worse   Back pain with recent fall at home.  Patient has an history of recent fall.  Imaging negative for any acute fracture. -PT/OT evaluation.  Hyperlipidemia. -Continue home statin.  GERD.  No obvious GI bleed.  Discontinue IV Protonix. -Continue with oral Protonix.    DVT prophylaxis: SCDs Code Status: DNR Family Communication: Discussed with patient Disposition Plan:  Status  is: Inpatient    Consultants:   None  Procedures:  None   Antimicrobials:  Anti-infectives (From admission, onward)   Start     Dose/Rate Route Frequency Ordered Stop   08/16/20 2200  ceFEPIme (MAXIPIME) 2 g in sodium chloride 0.9 % 100 mL IVPB        2 g 200 mL/hr over 30 Minutes Intravenous Every 12 hours 08/16/20 1822     08/15/20 1000  ceFEPIme (MAXIPIME) 2 g in sodium chloride 0.9 % 100 mL IVPB  Status:  Discontinued        2 g 200 mL/hr over 30 Minutes Intravenous Every 12 hours 08/15/20 0834 08/16/20 1558   08/13/20 2000  vancomycin (VANCOREADY) IVPB 750 mg/150 mL        750 mg 150 mL/hr over 60 Minutes Intravenous Every 12 hours 08/13/20 0336     08/13/20 0245  vancomycin (VANCOCIN) IVPB 1000 mg/200 mL premix        1,000 mg 200 mL/hr over 60 Minutes Intravenous  Once 08/13/20 0242 08/13/20 0609   08/12/20 2030  cefTRIAXone (ROCEPHIN) 2 g in sodium chloride 0.9 % 100 mL IVPB  Status:  Discontinued        2 g 200 mL/hr  over 30 Minutes Intravenous Every 24 hours 08/12/20 2016 08/13/20 0242       Subjective: Patient reports feeling uncomfortable but pain is controlled. No fever/chills.     Objective: Vitals:   08/16/20 1957 08/16/20 2123 08/17/20 0903 08/17/20 1351  BP: (!) 91/53 112/75 104/65 (!) 88/51  Pulse: 84 89 86 67  Resp: 18  19 17   Temp: 98.2 F (36.8 C)  98 F (36.7 C) 97.9 F (36.6 C)  TempSrc: Oral  Oral   SpO2: 100%  100% 96%  Weight:      Height:        Intake/Output Summary (Last 24 hours) at 08/17/2020 1409 Last data filed at 08/17/2020 1200 Gross per 24 hour  Intake --  Output 850 ml  Net -850 ml   Filed Weights   08/12/20 2156  Weight: 98.4 kg    Examination:  General exam: Appears calm and comfortable  Respiratory system: Clear to auscultation. Respiratory effort normal. Cardiovascular system: S1 & S2 heard, RRR. No JVD, murmurs, rubs, gallops or clicks. No pedal edema. Gastrointestinal system: Abdomen is nondistended, soft and nontender. No organomegaly or masses felt. Normal bowel sounds heard. Central nervous system: Alert and oriented. No focal neurological deficits. Extremities: Symmetric 5 x 5 power. Skin: No rashes, lesions or ulcers Psychiatry: Judgement and insight appear normal. Mood & affect appropriate.     Data Reviewed: I have personally reviewed following labs and imaging studies  CBC: Recent Labs  Lab 08/12/20 2014 08/13/20 0435 08/13/20 1807 08/13/20 1807 08/14/20 0201 08/14/20 0725 08/15/20 0359 08/16/20 0211 08/17/20 0224  WBC 34.6*   < > 28.1*  --   --  26.8* 28.7* 31.3* 28.7*  NEUTROABS 31.9*  --   --   --   --   --   --   --   --   HGB 6.8*   < > 7.1*   < > 8.0* 8.3* 8.3* 8.8* 8.6*  HCT 24.1*   < > 24.5*   < > 26.4* 28.0* 27.7* 30.1* 28.7*  MCV 71.3*   < > 74.0*  --   --  76.1* 75.5* 78.0* 76.7*  PLT 542*   < > 399  --   --  376 389 373  405*   < > = values in this interval not displayed.   Basic Metabolic Panel: Recent  Labs  Lab 08/12/20 2014 08/13/20 0435 08/14/20 0550 08/15/20 0359 08/16/20 0211  NA 135 135 137 134* 135  K 4.0 3.7 3.7 3.8 4.8  CL 99 101 104 104 103  CO2 24 22 24  20* 19*  GLUCOSE 121* 104* 143* 141* 129*  BUN 23 16 12 15 23   CREATININE 1.09* 1.01* 1.03* 1.08* 1.35*  CALCIUM 9.2 8.7* 8.9 8.6* 8.6*  MG  --  1.8 1.9  --   --    GFR: Estimated Creatinine Clearance: 41.5 mL/min (A) (by C-G formula based on SCr of 1.35 mg/dL (H)). Liver Function Tests: Recent Labs  Lab 08/12/20 2014 08/13/20 0435 08/14/20 0550  AST 19 18 15   ALT 15 15 15   ALKPHOS 86 82 85  BILITOT 1.0 1.3* 0.7  PROT 6.5 5.5* 5.6*  ALBUMIN 3.5 2.8* 2.5*   No results for input(s): LIPASE, AMYLASE in the last 168 hours. No results for input(s): AMMONIA in the last 168 hours. Coagulation Profile: Recent Labs  Lab 08/12/20 2014 08/13/20 0435  INR 1.1 1.2   Cardiac Enzymes: No results for input(s): CKTOTAL, CKMB, CKMBINDEX, TROPONINI in the last 168 hours. BNP (last 3 results) Recent Labs    12/11/19 1105  PROBNP 26.0   HbA1C: No results for input(s): HGBA1C in the last 72 hours. CBG: Recent Labs  Lab 08/16/20 1209 08/16/20 1604 08/16/20 2055 08/17/20 0745 08/17/20 1106  GLUCAP 123* 109* 120* 90 143*   Lipid Profile: No results for input(s): CHOL, HDL, LDLCALC, TRIG, CHOLHDL, LDLDIRECT in the last 72 hours. Thyroid Function Tests: No results for input(s): TSH, T4TOTAL, FREET4, T3FREE, THYROIDAB in the last 72 hours. Anemia Panel: No results for input(s): VITAMINB12, FOLATE, FERRITIN, TIBC, IRON, RETICCTPCT in the last 72 hours. Urine analysis:    Component Value Date/Time   COLORURINE YELLOW 08/13/2020 0012   APPEARANCEUR CLEAR 08/13/2020 0012   LABSPEC 1.014 08/13/2020 0012   PHURINE 7.0 08/13/2020 0012   GLUCOSEU NEGATIVE 08/13/2020 0012   GLUCOSEU NEGATIVE 02/06/2020 1039   HGBUR NEGATIVE 08/13/2020 0012   BILIRUBINUR NEGATIVE 08/13/2020 0012   BILIRUBINUR neg 03/09/2017 1759    KETONESUR NEGATIVE 08/13/2020 0012   PROTEINUR NEGATIVE 08/13/2020 0012   UROBILINOGEN 0.2 02/06/2020 1039   NITRITE NEGATIVE 08/13/2020 0012   LEUKOCYTESUR NEGATIVE 08/13/2020 0012   Sepsis Labs: @LABRCNTIP (procalcitonin:4,lacticidven:4)  Recent Results (from the past 240 hour(s))  Respiratory Panel by RT PCR (Flu A&B, Covid) - Nasopharyngeal Swab     Status: None   Collection Time: 08/12/20  8:16 PM   Specimen: Nasopharyngeal Swab  Result Value Ref Range Status   SARS Coronavirus 2 by RT PCR NEGATIVE NEGATIVE Final    Comment: (NOTE) SARS-CoV-2 target nucleic acids are NOT DETECTED.  The SARS-CoV-2 RNA is generally detectable in upper respiratoy specimens during the acute phase of infection. The lowest concentration of SARS-CoV-2 viral copies this assay can detect is 131 copies/mL. A negative result does not preclude SARS-Cov-2 infection and should not be used as the sole basis for treatment or other patient management decisions. A negative result may occur with  improper specimen collection/handling, submission of specimen other than nasopharyngeal swab, presence of viral mutation(s) within the areas targeted by this assay, and inadequate number of viral copies (<131 copies/mL). A negative result must be combined with clinical observations, patient history, and epidemiological information. The expected result is Negative.  Fact Sheet for Patients:  PinkCheek.be  Fact Sheet for Healthcare Providers:  GravelBags.it  This test is no t yet approved or cleared by the [place] FDA and  has been authorized for detection and/or diagnosis of SARS-CoV-2 by FDA under an Emergency Use Authorization (EUA). This EUA will remain  in effect (meaning this test can be used) for the duration of the COVID-19 declaration under Section 564(b)(1) of the Act, 21 U.S.C. section 360bbb-3(b)(1), unless the authorization is terminated  or revoked sooner.     Influenza A by PCR NEGATIVE NEGATIVE Final   Influenza B by PCR NEGATIVE NEGATIVE Final    Comment: (NOTE) The Xpert Xpress SARS-CoV-2/FLU/RSV assay is intended as an aid in  the diagnosis of influenza from Nasopharyngeal swab specimens and  should not be used as a sole basis for treatment. Nasal washings and  aspirates are unacceptable for Xpert Xpress SARS-CoV-2/FLU/RSV  testing.  Fact Sheet for Patients: PinkCheek.be  Fact Sheet for Healthcare Providers: GravelBags.it  This test is not yet approved or cleared by the [place] FDA and  has been authorized for detection and/or diagnosis of SARS-CoV-2 by  FDA under an Emergency Use Authorization (EUA). This EUA will remain  in effect (meaning this test can be used) for the duration of the  Covid-19 declaration under Section 564(b)(1) of the Act, 21  U.S.C. section 360bbb-3(b)(1), unless the authorization is  terminated or revoked. Performed at [facility], [place] [address] [place], [place] [address]   Blood Culture (routine x 2)     Status: None   Collection Time: 08/12/20  8:18 PM   Specimen: BLOOD  Result Value Ref Range Status   Specimen Description BLOOD LEFT ANTECUBITAL  Final   Special Requests   Final    BOTTLES DRAWN AEROBIC AND ANAEROBIC Blood Culture adequate volume   Culture   Final    NO GROWTH 5 DAYS Performed at [facility], [place] [address] [place], [place] [address]    Report Status 08/17/2020 FINAL  Final  Blood Culture (routine x 2)     Status: None   Collection Time: 08/12/20  8:20 PM   Specimen: BLOOD  Result Value Ref Range Status   Specimen Description BLOOD RIGHT ANTECUBITAL  Final   Special Requests   Final    BOTTLES DRAWN AEROBIC AND ANAEROBIC Blood Culture results may not be optimal due to an excessive volume of blood received in culture bottles   Culture   Final    NO GROWTH 5 DAYS Performed at  [facility], [place] [address] [place], [place] [address]    Report Status 08/17/2020 FINAL  Final  Urine culture     Status: Abnormal   Collection Time: 08/13/20 12:11 AM   Specimen: In/Out Cath Urine  Result Value Ref Range Status   Specimen Description IN/OUT CATH URINE  Final   Special Requests NONE  Final   Culture (A)  Final    <10,000 COLONIES/mL INSIGNIFICANT GROWTH Performed at [facility] [address], [place], [place] [address]    Report Status 08/14/2020 FINAL  Final  Aerobic Culture (superficial specimen)     Status: None   Collection Time: 08/13/20  2:43 AM   Specimen: Wound  Result Value Ref Range Status   Specimen Description WOUND RIGHT LOWER LEG  Final   Special Requests NONE  Final   Gram Stain   Final    RARE WBC PRESENT, PREDOMINANTLY PMN NO ORGANISMS SEEN Performed at [facility]  [facility], [place] [address] [place], [place] [address]    Culture   Final    MODERATE CORYNEBACTERIUM PSEUDODIPHTHERIAE Standardized susceptibility testing for this organism is not available. RARE STAPHYLOCOCCUS EPIDERMIDIS    Report Status 08/16/2020 FINAL  Final   Organism ID, Bacteria STAPHYLOCOCCUS EPIDERMIDIS  Final      Susceptibility   Staphylococcus epidermidis - MIC*    CIPROFLOXACIN >=8 RESISTANT Resistant     ERYTHROMYCIN >=8 RESISTANT Resistant     GENTAMICIN <=0.5 SENSITIVE Sensitive     OXACILLIN >=4 RESISTANT Resistant     TETRACYCLINE >=16 RESISTANT Resistant     VANCOMYCIN 1 SENSITIVE Sensitive     TRIMETH/SULFA <=10 SENSITIVE Sensitive     CLINDAMYCIN <=0.25 SENSITIVE Sensitive     RIFAMPIN <=0.5 SENSITIVE Sensitive     Inducible Clindamycin NEGATIVE Sensitive     * RARE STAPHYLOCOCCUS EPIDERMIDIS         Radiology Studies last 96 hours: VAS [place] LOWER EXTREMITY VENOUS (DVT)  Result Date: 08/15/2020  Lower Venous DVTStudy Indications: Pain, Swelling, Edema, and Erythema.  Risk Factors: Obesity. Performing Technologist: Griffin Basil  RCT RDMS  Examination Guidelines: A complete evaluation includes B-mode imaging, spectral Doppler, color Doppler, and power Doppler as needed of all accessible portions of each vessel. Bilateral testing is considered an integral part of a complete examination. Limited examinations for reoccurring indications may be performed as noted. The reflux portion of the exam is performed with the patient in reverse Trendelenburg.  +---------+---------------+---------+-----------+----------+-----------------+ RIGHT    CompressibilityPhasicitySpontaneityPropertiesThrombus Aging    +---------+---------------+---------+-----------+----------+-----------------+ CFV      Full           Yes      Yes                                    +---------+---------------+---------+-----------+----------+-----------------+ SFJ      Full                                                           +---------+---------------+---------+-----------+----------+-----------------+ FV Prox  Full                                                           +---------+---------------+---------+-----------+----------+-----------------+ FV Mid   Full                                                           +---------+---------------+---------+-----------+----------+-----------------+ FV Distal               Yes      Yes                                    +---------+---------------+---------+-----------+----------+-----------------+ PFV      Full                                                           +---------+---------------+---------+-----------+----------+-----------------+  POP                     Yes      Yes                                    +---------+---------------+---------+-----------+----------+-----------------+ PTV                                                   Color images only +---------+---------------+---------+-----------+----------+-----------------+ PERO                                                   Color images only +---------+---------------+---------+-----------+----------+-----------------+ Limited distal leg evaluation due to pain and swelling . Color images only , No compression images due to pain.  +----+---------------+---------+-----------+----------+--------------+ LEFTCompressibilityPhasicitySpontaneityPropertiesThrombus Aging +----+---------------+---------+-----------+----------+--------------+ CFV                Yes      Yes                                 +----+---------------+---------+-----------+----------+--------------+     Summary: RIGHT: - There is no evidence of deep vein thrombosis in the lower extremity.  - No cystic structure found in the popliteal fossa.  LEFT: - No evidence of common femoral vein obstruction.  *See table(s) above for measurements and observations. Electronically signed by Monica Martinez MD on 08/15/2020 at 3:05:10 PM.    Final          Scheduled Meds: . atorvastatin  20 mg Oral Daily  . diltiazem  240 mg Oral Daily  . DULoxetine  40 mg Oral Daily  . ferrous sulfate  325 mg Oral BID WC  . gabapentin  200 mg Oral QHS  . insulin aspart  0-15 Units Subcutaneous TID WC  . insulin aspart  0-5 Units Subcutaneous QHS  . losartan  100 mg Oral Daily  . metoprolol tartrate  50 mg Oral BID  . pantoprazole  40 mg Oral Daily  . Prednisol Ace-Moxiflox-Bromfen  1 drop Right Eye QID  . vitamin B-12  1,000 mcg Oral Daily   Continuous Infusions: . ceFEPime (MAXIPIME) IV 2 g (08/17/20 0911)  . vancomycin 750 mg (08/17/20 1024)     LOS: 4 days    Time spent: 40 min    Emeterio Reeve, DO [facility]  If 7PM-7AM, please contact night-coverage [email or web address] 08/17/2020, 2:09 PM

## 2020-08-17 NOTE — Progress Notes (Signed)
Pharmacy Antibiotic Note  [name] is a 74 y.o. female admitted on 08/12/2020 with cellulitis.  Pharmacy has been consulted for Vancomycin dosing.   Today, the patient remains afebrile with WBC elevated at 28.7. Wound culture growing staph epidermidis and corynebacterium. [name] is slightly up today at 1.35. Team had added back cefepime yesterday for concern with spreading erythema. Will obtain vanc levels today since none have been obtained this admission.   Plan: Vancomycin 750 mg IV q12h Cefepime 2g IV q12h  Monitor fevers, WBC, cultures, and clinical progress Obtain vanc levels as indicated    Height: 5\' 3"  (160 cm) Weight: 98.4 kg (216 lb 14.9 oz) IBW/kg (Calculated) : 52.4  Temp (24hrs), Avg:98.1 F (36.7 C), Min:98 F (36.7 C), Max:98.2 F (36.8 C)  Recent Labs  Lab 08/12/20 2014 08/12/20 2014 08/12/20 2214 08/13/20 0435 08/13/20 0435 08/13/20 1807 08/14/20 0550 08/14/20 0725 08/15/20 0359 08/16/20 0211 08/17/20 0224  WBC 34.6*   < >  --  29.6*   < > 28.1*  --  26.8* 28.7* 31.3* 28.7*  CREATININE 1.09*  --   --  1.01*  --   --  1.03*  --  1.08* 1.35*  --   LATICACIDVEN 2.5*  --  1.9  --   --   --   --   --   --   --   --    < > = values in this interval not displayed.    Estimated Creatinine Clearance: 41.5 mL/min (A) (by C-G formula based on SCr of 1.35 mg/dL (H)).    Allergies  Allergen Reactions  . 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 Sulfonamide Derivatives Itching and Rash    [name], PharmD PGY1 Acute Care Pharmacy Resident  08/17/2020 1:00 PM  Please check [email or web address] for unit-specific pharmacy phone numbers.

## 2020-08-17 NOTE — Plan of Care (Signed)

## 2020-08-17 NOTE — Plan of Care (Signed)
Pt had a bowel movement.

## 2020-08-18 LAB — CBC
HCT: 26.3 % — ABNORMAL LOW (ref 36.0–46.0)
Hemoglobin: 7.7 g/dL — ABNORMAL LOW (ref 12.0–15.0)
MCH: 22.5 pg — ABNORMAL LOW (ref 26.0–34.0)
MCHC: 29.3 g/dL — ABNORMAL LOW (ref 30.0–36.0)
MCV: 76.9 fL — ABNORMAL LOW (ref 80.0–100.0)
Platelets: 365 10*3/uL (ref 150–400)
RBC: 3.42 MIL/uL — ABNORMAL LOW (ref 3.87–5.11)
RDW: 22.9 % — ABNORMAL HIGH (ref 11.5–15.5)
WBC: 21.3 10*3/uL — ABNORMAL HIGH (ref 4.0–10.5)
nRBC: 0 % (ref 0.0–0.2)

## 2020-08-18 LAB — GLUCOSE, CAPILLARY
Glucose-Capillary: 95 mg/dL (ref 70–99)
Glucose-Capillary: 108 mg/dL — ABNORMAL HIGH (ref 70–99)
Glucose-Capillary: 102 mg/dL — ABNORMAL HIGH (ref 70–99)
Glucose-Capillary: 100 mg/dL — ABNORMAL HIGH (ref 70–99)

## 2020-08-18 NOTE — Progress Notes (Signed)
Pt telemetry order was expired, Rn cortex the provider  B,Choniter, MD to renew  the expired order. Rn made provider aware that the pt was hypotensive, earlier in the day time and  pt cellulitis continue to spreading and would like for it to be renewed. The provider B,Choniter stated that the "ordered expired 2 days ago and patients don't always need to be monitored. Only need to monitor if a CV reason for it. Has had stable HR and rhythm for past few days it looks like so doesn't need to be continually monitored on telemetry"

## 2020-08-18 NOTE — Plan of Care (Signed)

## 2020-08-18 NOTE — Progress Notes (Addendum)
PROGRESS NOTE    [name]  LOV:[id number] DOB: 1946/01/21 DOA: 08/12/2020 PCP: Biagio Borg, MD    Brief Narrative:   LOS 5 days  Presented to ED with  Chief Complaint  Patient presents with  . Headache  . Dizziness   ER course  1-2 weeks prior, a blister on the anterior surface of her right leghad burst.In the days following, patient began to develop some drainage from this area with surrounding redness and swelling.  Later developed worsening generalized weakness and lethargy.  Poor appetite and lightheadedness. Admitted 08/12/20 with sepsis secondary to cellulitis.  Initially started on ceftriaxone which was switched to vancomycin according to protocol. Patient also has an hemoglobin of 6.8 on presentation without any obvious bleeding.  Received 1 unit of PRBC.   08/17/20: BP low in the afternoon, concern for worsening cellulitis reverting back to sepsis, see yesterday's note for full details. Responded well to 1L fluid bolus, BP Rx held, CT leg showed NO deep infection   Today 08/18/20: BP has rebounded, pt is still on NS 150 cc/hour, BP dropped again in afternoon, will keep on IV fluids. Cellulitis - see photos below. Slight improvement in anterior/lateral thigh but medial thigh same.       Assessment & Plan:   Principal Problem:   Sepsis due to cellulitis [facility]) Active Problems:   Essential hypertension   Mixed hyperlipidemia   Cellulitis of right lower extremity   Injury of back due to fall   Microcytic anemia   Lactic acidosis   Sepsis ([place])   Sepsis secondary to cellulitis POA.  Patient initially met sepsis criteria with fever, tachycardia, tachypnea and leukocytosis along with lactic acidosis, which resolved. Blood and urine cultures negative.  Wound cultures as below. Patient received IV fluid and broad-spectrum antibiotics according to protocol. Antibiotic switched to vancomycin for severe purulent cellulitis, added cefepime. -Skin was  marked with sharpie 08/15/20 to see if there is more expansion, today 08/18/20 there is maybe some improvement anterior/lateral leg and stable on medial thigh (extended very slightly past mark but has not changed since 08/16/20). -wound Cx (+)staph epidermidis, sensitive to vancomycin -wound Cx (+)corynebacterium should be susceptible to vancomycin, initially d/c cefepime but pt concerned about spreading erythema, abx restarted -bullae on inner R leg were expanding a bit yesterday 10/30 but seem stable today 10/31, CT negative for deeper infection 10/30  -low threshold to consult I and perhaps add another unit PRBC, if cellulitis not improving tomorrow 11/01 would strongly consider it  -wound care consult placed   Photos taken around [facility] 08/18/20   R lower leg - distal   R medial leg near groin - very slightly beyond borders of marker, stable since 08/16/2020   R medial leg - bullous skin changes spreading anteriorly but not extending proximal/distal. Skin intact for now but wound nurse will be helpful here, consult placed    Wide photo of anterior RLE - erythema on anterior/lateral leg appears improved slightly from 08/16/20     Hypotension 08/17/20 - resolved this AM, a bit lower again around 1 PM. Rechecked chart around 18:00, I was not notified of 113/51 BP at 14:10, messaged nurse and asked to recheck, pending  Concern given sepsis. Monitor closely. Was planning to D/C IV fluids today and encourage po hydration but I think will leave on for now. May restart home meds vs try dosing Lasix to possibly help LE edema though I suspect this is dependent, not clinically  in HF. Pt on lasix at home, this has been held. Hgb has been >7 but might be worth another unit of blood given symptoms and labile BP  Microcytic anemia.  On presentation her hemoglobin was 6.8 without any obvious bleeding.  No blood in the rectal vault for Hemoccult testing in ED. FOBT order-still pending, nursing aware to  collect specimen when pt has BM, she has denied dark/tarry stool.  She received 1 unit of PRBC. Hemoglobin improved to 8.3.  Anemia panel with iron deficiency anemia and B12 within lower normal limit of 205. Patient has an history of lower GI bleed in 2016 and at that time endoscopy/capsule endoscopy and colonoscopy was done which was positive for diverticulosis and hemorrhoids. Pathology was negative for any malignancy.  They were recommending outpatient banding, not sure whether it was done or not -Hgb today slightly lower (8.6 yesterday to 7.7) very likely dilutional effect, will repeat w/ AM labs  -Continue with p.o. supplement. -She will need outpatient GI evaluation.  Essential hypertension.  Blood pressure mostly within goal but hypotension today as above -Diuretic was being held secondary to sepsis but I think ok to restart if hypotension resolves, given edema a bit worse   Back pain with recent fall at home.  Patient has an history of recent fall.  Imaging negative for any acute fracture. -PT/OT evaluation.  Hyperlipidemia. -Continue home statin.  GERD.  No obvious GI bleed.  Discontinue IV Protonix. -Continue with oral Protonix. -NEED FOBT nursing aware     DVT prophylaxis: SCDs Code Status: DNR Family Communication: Discussed with patient Disposition Plan:  Status is: Inpatient    Consultants:   None  Procedures:  None   Antimicrobials:  Anti-infectives (From admission, onward)   Start     Dose/Rate Route Frequency Ordered Stop   08/18/20 0500  vancomycin (VANCOREADY) IVPB 1250 mg/250 mL        1,250 mg 166.7 mL/hr over 90 Minutes Intravenous Every 24 hours 08/17/20 2242     08/16/20 2200  ceFEPIme (MAXIPIME) 2 g in sodium chloride 0.9 % 100 mL IVPB        2 g 200 mL/hr over 30 Minutes Intravenous Every 12 hours 08/16/20 1822     08/15/20 1000  ceFEPIme (MAXIPIME) 2 g in sodium chloride 0.9 % 100 mL IVPB  Status:  Discontinued        2 g 200 mL/hr over  30 Minutes Intravenous Every 12 hours 08/15/20 0834 08/16/20 1558   08/13/20 2000  vancomycin (VANCOREADY) IVPB 750 mg/150 mL  Status:  Discontinued        750 mg 150 mL/hr over 60 Minutes Intravenous Every 12 hours 08/13/20 0336 08/17/20 2242   08/13/20 0245  vancomycin (VANCOCIN) IVPB 1000 mg/200 mL premix        1,000 mg 200 mL/hr over 60 Minutes Intravenous  Once 08/13/20 0242 08/13/20 0609   08/12/20 2030  cefTRIAXone (ROCEPHIN) 2 g in sodium chloride 0.9 % 100 mL IVPB  Status:  Discontinued        2 g 200 mL/hr over 30 Minutes Intravenous Every 24 hours 08/12/20 2016 08/13/20 0242       Subjective: Patient reports feeling uncomfortable but pain is controlled. No fever/chills.     Objective: Vitals:   08/17/20 2139 08/17/20 2236 08/18/20 0135 08/18/20 0311  BP: (!) 103/58 125/75 116/64 (!) 154/70  Pulse: 81 88 83 89  Resp:      Temp:      TempSrc:  SpO2:      Weight:      Height:        Intake/Output Summary (Last 24 hours) at 08/18/2020 1249 Last data filed at 08/18/2020 0300 Gross per 24 hour  Intake --  Output 400 ml  Net -400 ml   Filed Weights   08/12/20 2156  Weight: 98.4 kg    Examination:  General exam: Appears calm and comfortable  Respiratory system: Clear to auscultation. Respiratory effort normal. Cardiovascular system: S1 & S2 heard, RRR. No JVD, murmurs, rubs, gallops or clicks. No pedal edema. Gastrointestinal system: Abdomen is nondistended, soft and nontender. No organomegaly or masses felt. Normal bowel sounds heard. Central nervous system: Alert and oriented. No focal neurological deficits. Extremities: Symmetric 5 x 5 power. Skin: No rashes, lesions or ulcers Psychiatry: Judgement and insight appear normal. Mood & affect appropriate.     Data Reviewed: I have personally reviewed following labs and imaging studies  CBC: Recent Labs  Lab 08/12/20 2014 08/13/20 0435 08/15/20 0359 08/16/20 0211 08/17/20 0224 08/17/20 1525  08/18/20 0600  WBC 34.6*   < > 28.7* 31.3* 28.7* 27.6* 21.3*  NEUTROABS 31.9*  --   --   --   --   --   --   HGB 6.8*   < > 8.3* 8.8* 8.6* 8.6* 7.7*  HCT 24.1*   < > 27.7* 30.1* 28.7* 28.4* 26.3*  MCV 71.3*   < > 75.5* 78.0* 76.7* 74.9* 76.9*  PLT 542*   < > 389 373 405* 403* 365   < > = values in this interval not displayed.   Basic Metabolic Panel: Recent Labs  Lab 08/13/20 0435 08/14/20 0550 08/15/20 0359 08/16/20 0211 08/17/20 1525  NA 135 137 134* 135 137  K 3.7 3.7 3.8 4.8 4.0  CL 101 104 104 103 107  CO2 22 24 20* 19* 21*  GLUCOSE 104* 143* 141* 129* 128*  BUN _0 33*  CREATININE 1.01* 1.03* 1.08* 1.35* 1.35*  CALCIUM 8.7* 8.9 8.6* 8.6* 8.8*  MG 1.8 1.9  --   --   --    GFR: Estimated Creatinine Clearance: 41.5 mL/min (A) (by C-G formula based on SCr of 1.35 mg/dL (H)). Liver Function Tests: Recent Labs  Lab 08/12/20 2014 08/13/20 0435 08/14/20 0550 08/17/20 1525  AST _1 14*  ALT _2 ALKPHOS 86 82 85 95  BILITOT 1.0 1.3* 0.7 0.5  PROT 6.5 5.5* 5.6* 5.7*  ALBUMIN 3.5 2.8* 2.5* 2.2*   No results for input(s): LIPASE, AMYLASE in the last 168 hours. No results for input(s): AMMONIA in the last 168 hours. Coagulation Profile: Recent Labs  Lab 08/12/20 2014 08/13/20 0435  INR 1.1 1.2   Cardiac Enzymes: No results for input(s): CKTOTAL, CKMB, CKMBINDEX, TROPONINI in the last 168 hours. BNP (last 3 results) Recent Labs    12/11/19 1105  PROBNP 26.0   HbA1C: No results for input(s): HGBA1C in the last 72 hours. CBG: Recent Labs  Lab 08/17/20 1106 08/17/20 1620 08/17/20 2150 08/18/20 0748 08/18/20 1201  GLUCAP 143* 121* 150* 95 108*   Lipid Profile: No results for input(s): CHOL, HDL, LDLCALC, TRIG, CHOLHDL, LDLDIRECT in the last 72 hours. Thyroid Function Tests: No results for input(s): TSH, T4TOTAL, FREET4, T3FREE, THYROIDAB in the last 72 hours. Anemia Panel: No results for input(s): VITAMINB12, FOLATE, FERRITIN,  TIBC, IRON, RETICCTPCT in the last 72 hours. Urine analysis:    Component Value Date/Time  COLORURINE YELLOW 08/13/2020 0012   APPEARANCEUR CLEAR 08/13/2020 0012   LABSPEC 1.014 08/13/2020 0012   PHURINE 7.0 08/13/2020 0012   GLUCOSEU NEGATIVE 08/13/2020 0012   GLUCOSEU NEGATIVE 02/06/2020 1039   HGBUR NEGATIVE 08/13/2020 0012   BILIRUBINUR NEGATIVE 08/13/2020 0012   BILIRUBINUR neg 03/09/2017 1759   KETONESUR NEGATIVE 08/13/2020 0012   PROTEINUR NEGATIVE 08/13/2020 0012   UROBILINOGEN 0.2 02/06/2020 1039   NITRITE NEGATIVE 08/13/2020 0012   LEUKOCYTESUR NEGATIVE 08/13/2020 0012   Sepsis Labs: _0 (procalcitonin:4,lacticidven:4)  Recent Results (from the past 240 hour(s))  Respiratory Panel by RT PCR (Flu A&B, Covid) - Nasopharyngeal Swab     Status: None   Collection Time: 08/12/20  8:16 PM   Specimen: Nasopharyngeal Swab  Result Value Ref Range Status   SARS Coronavirus 2 by RT PCR NEGATIVE NEGATIVE Final    Comment: (NOTE) SARS-CoV-2 target nucleic acids are NOT DETECTED.  The SARS-CoV-2 RNA is generally detectable in upper respiratoy specimens during the acute phase of infection. The lowest concentration of SARS-CoV-2 viral copies this assay can detect is 131 copies/mL. A negative result does not preclude SARS-Cov-2 infection and should not be used as the sole basis for treatment or other patient management decisions. A negative result may occur with  improper specimen collection/handling, submission of specimen other than nasopharyngeal swab, presence of viral mutation(s) within the areas targeted by this assay, and inadequate number of viral copies (<131 copies/mL). A negative result must be combined with clinical observations, patient history, and epidemiological information. The expected result is Negative.  Fact Sheet for Patients:  PinkCheek.be  Fact Sheet for Healthcare Providers:   GravelBags.it  This test is no t yet approved or cleared by the [place] FDA and  has been authorized for detection and/or diagnosis of SARS-CoV-2 by FDA under an Emergency Use Authorization (EUA). This EUA will remain  in effect (meaning this test can be used) for the duration of the COVID-19 declaration under Section 564(b)(1) of the Act, 21 U.S.C. section 360bbb-3(b)(1), unless the authorization is terminated or revoked sooner.     Influenza A by PCR NEGATIVE NEGATIVE Final   Influenza B by PCR NEGATIVE NEGATIVE Final    Comment: (NOTE) The Xpert Xpress SARS-CoV-2/FLU/RSV assay is intended as an aid in  the diagnosis of influenza from Nasopharyngeal swab specimens and  should not be used as a sole basis for treatment. Nasal washings and  aspirates are unacceptable for Xpert Xpress SARS-CoV-2/FLU/RSV  testing.  Fact Sheet for Patients: PinkCheek.be  Fact Sheet for Healthcare Providers: GravelBags.it  This test is not yet approved or cleared by the [place] FDA and  has been authorized for detection and/or diagnosis of SARS-CoV-2 by  FDA under an Emergency Use Authorization (EUA). This EUA will remain  in effect (meaning this test can be used) for the duration of the  Covid-19 declaration under Section 564(b)(1) of the Act, 21  U.S.C. section 360bbb-3(b)(1), unless the authorization is  terminated or revoked. Performed at [facility], [place] [address], [place], [place] [address]   Blood Culture (routine x 2)     Status: None   Collection Time: 08/12/20  8:18 PM   Specimen: BLOOD  Result Value Ref Range Status   Specimen Description BLOOD LEFT ANTECUBITAL  Final   Special Requests   Final    BOTTLES DRAWN AEROBIC AND ANAEROBIC Blood Culture adequate volume   Culture   Final    NO GROWTH 5 DAYS Performed at [facility]  Lab, [address], [place], [place] [address]     Report Status 08/17/2020 FINAL  Final  Blood Culture (routine x 2)     Status: None   Collection Time: 08/12/20  8:20 PM   Specimen: BLOOD  Result Value Ref Range Status   Specimen Description BLOOD RIGHT ANTECUBITAL  Final   Special Requests   Final    BOTTLES DRAWN AEROBIC AND ANAEROBIC Blood Culture results may not be optimal due to an excessive volume of blood received in culture bottles   Culture   Final    NO GROWTH 5 DAYS Performed at [facility], [place] [address], [place], [place] [address]    Report Status 08/17/2020 FINAL  Final  Urine culture     Status: Abnormal   Collection Time: 08/13/20 12:11 AM   Specimen: In/Out Cath Urine  Result Value Ref Range Status   Specimen Description IN/OUT CATH URINE  Final   Special Requests NONE  Final   Culture (A)  Final    <10,000 COLONIES/mL INSIGNIFICANT GROWTH Performed at [place] [address], [place], [place] [address]    Report Status 08/14/2020 FINAL  Final  Aerobic Culture (superficial specimen)     Status: None   Collection Time: 08/13/20  2:43 AM   Specimen: Wound  Result Value Ref Range Status   Specimen Description WOUND RIGHT LOWER LEG  Final   Special Requests NONE  Final   Gram Stain   Final    RARE WBC PRESENT, PREDOMINANTLY PMN NO ORGANISMS SEEN Performed at [facility], [place] [address], [place],  [address]    Culture   Final    MODERATE CORYNEBACTERIUM PSEUDODIPHTHERIAE Standardized susceptibility testing for this organism is not available. RARE STAPHYLOCOCCUS EPIDERMIDIS    Report Status 08/16/2020 FINAL  Final   Organism ID, Bacteria STAPHYLOCOCCUS EPIDERMIDIS  Final      Susceptibility   Staphylococcus epidermidis - MIC*    CIPROFLOXACIN >=8 RESISTANT Resistant     ERYTHROMYCIN >=8 RESISTANT Resistant     GENTAMICIN <=0.5 SENSITIVE Sensitive     OXACILLIN >=4 RESISTANT Resistant     TETRACYCLINE >=16 RESISTANT Resistant     VANCOMYCIN 1 SENSITIVE Sensitive      TRIMETH/SULFA <=10 SENSITIVE Sensitive     CLINDAMYCIN <=0.25 SENSITIVE Sensitive     RIFAMPIN <=0.5 SENSITIVE Sensitive     Inducible Clindamycin NEGATIVE Sensitive     * RARE STAPHYLOCOCCUS EPIDERMIDIS         Radiology Studies last 96 hours: CT FOOT RIGHT W CONTRAST  Result Date: 08/17/2020 CLINICAL DATA:  Cellulitis, epidermolysis of the right lower extremity EXAM: CT OF THE LOWER RIGHT EXTREMITY WITH CONTRAST CT OF THE RIGHT FOOT WITH CONTRAST CT OF THE RIGHT TIBIA AND FIBULA WITH CONTRAST TECHNIQUE: Multidetector CT imaging of the lower right extremity, right tibia and fibula, and right foot was performed according to the standard protocol following intravenous contrast administration. COMPARISON:  None. CONTRAST:  1103m OMNIPAQUE IOHEXOL 350 MG/ML SOLN FINDINGS: Bones/Joint/Cartilage Mild right hip degenerative arthritis. Status post right total knee arthroplasty with arthroplasty components in expected alignment. 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 No acute fracture or dislocation. No cortical erosion. No lytic or blastic bone lesion. Ligaments Suboptimally assessed by CT. Muscles and Tendons Mild relative fatty infiltration of the vastus lateralis. Otherwise no other significant findings.  Soft tissues Moderate subcutaneous edema predominantly anteriorly and laterally within the proximal thigh and circumferentially within the mid right foreleg. No discrete drainable fluid collection identified. Postsurgical changes noted within the anterior distal thigh and proximal right foreleg related to total knee arthroplasty. Shotty adenopathy noted within the right inguinal region. 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 IMPRESSION: Subcutaneous edema within the right thigh and  foreleg. No discrete drainable fluid collection identified. No asymmetric muscular enlargement or infiltration suggest acute myositis. No cortical erosion to suggest osteomyelitis. Electronically Signed   By: Fidela Salisbury MD   On: 08/17/2020 20:22   CT FEMUR RIGHT W CONTRAST  Result Date: 08/17/2020 CLINICAL DATA:  Cellulitis, epidermolysis of the right lower extremity EXAM: CT OF THE LOWER RIGHT EXTREMITY WITH CONTRAST CT OF THE RIGHT FOOT WITH CONTRAST CT OF THE RIGHT TIBIA AND FIBULA WITH CONTRAST TECHNIQUE: Multidetector CT imaging of the lower right extremity, right tibia and fibula, and right foot was performed according to the standard protocol following intravenous contrast administration. COMPARISON:  None. CONTRAST:  130m OMNIPAQUE IOHEXOL 350 MG/ML SOLN FINDINGS: Bones/Joint/Cartilage Mild right hip degenerative arthritis. Status post right total knee arthroplasty with arthroplasty components in expected alignment. 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 No acute fracture or dislocation. No cortical erosion. No lytic or blastic bone lesion. Ligaments Suboptimally assessed by CT. Muscles and Tendons Mild relative fatty infiltration of the vastus lateralis. Otherwise no other significant findings. Soft tissues Moderate subcutaneous edema predominantly anteriorly and laterally within the proximal thigh and circumferentially within the mid right foreleg. No discrete drainable fluid collection identified. Postsurgical changes noted within the anterior distal thigh and proximal right foreleg related to total knee arthroplasty. Shotty adenopathy noted within the right inguinal region. 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 IMPRESSION: Subcutaneous edema within the right thigh and  foreleg. No discrete drainable fluid collection identified. No asymmetric muscular enlargement or infiltration suggest acute myositis. No cortical erosion to suggest osteomyelitis. Electronically Signed   By: AFidela SalisburyMD   On: 08/17/2020 20:22   CT TIBIA FIBULA RIGHT W CONTRAST  Result Date: 08/17/2020 CLINICAL DATA:  Cellulitis, epidermolysis of the right lower extremity EXAM: CT OF THE LOWER RIGHT EXTREMITY WITH CONTRAST CT OF THE RIGHT FOOT WITH CONTRAST CT OF THE RIGHT TIBIA AND FIBULA WITH CONTRAST TECHNIQUE: Multidetector CT imaging of the lower right extremity, right tibia and fibula, and right foot was performed according to the standard protocol following intravenous contrast administration. COMPARISON:  None. CONTRAST:  1014mOMNIPAQUE IOHEXOL 350 MG/ML SOLN FINDINGS: Bones/Joint/Cartilage Mild right hip degenerative arthritis. Status post right total knee arthroplasty with arthroplasty components in expected alignment. 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 No acute fracture or dislocation. No cortical erosion. No lytic or blastic bone lesion. Ligaments Suboptimally assessed by CT. Muscles and Tendons Mild relative fatty infiltration of the vastus lateralis. Otherwise no other significant findings. Soft tissues Moderate subcutaneous edema predominantly anteriorly and laterally within the proximal thigh and circumferentially within the mid right foreleg. No discrete drainable fluid collection identified. Postsurgical changes noted within the anterior distal thigh and proximal right foreleg related to total knee arthroplasty. Shotty adenopathy noted within the right inguinal region. Incidental note  is made of multiple calcified involuted fibroids within the uterus. IMPRESSION: Subcutaneous edema within the right thigh and  foreleg. No discrete drainable fluid collection identified. No asymmetric muscular enlargement or infiltration suggest acute myositis. No cortical erosion to suggest osteomyelitis. Electronically Signed   By: Fidela Salisbury MD   On: 08/17/2020 20:22   VAS [place] LOWER EXTREMITY VENOUS (DVT)  Result Date: 08/15/2020  Lower Venous DVTStudy Indications: Pain, Swelling, Edema, and Erythema.  Risk Factors: Obesity. Performing Technologist: Griffin Basil RCT RDMS  Examination Guidelines: A complete evaluation includes B-mode imaging, spectral Doppler, color Doppler, and power Doppler as needed of all accessible portions of each vessel. Bilateral testing is considered an integral part of a complete examination. Limited examinations for reoccurring indications may be performed as noted. The reflux portion of the exam is performed with the patient in reverse Trendelenburg.  +---------+---------------+---------+-----------+----------+-----------------+ RIGHT    CompressibilityPhasicitySpontaneityPropertiesThrombus Aging    +---------+---------------+---------+-----------+----------+-----------------+ CFV      Full           Yes      Yes                                    +---------+---------------+---------+-----------+----------+-----------------+ SFJ      Full                                                           +---------+---------------+---------+-----------+----------+-----------------+ FV Prox  Full                                                           +---------+---------------+---------+-----------+----------+-----------------+ FV Mid   Full                                                           +---------+---------------+---------+-----------+----------+-----------------+ FV Distal               Yes      Yes                                    +---------+---------------+---------+-----------+----------+-----------------+ PFV      Full                                                            +---------+---------------+---------+-----------+----------+-----------------+ POP                     Yes      Yes                                    +---------+---------------+---------+-----------+----------+-----------------+  PTV                                                   Color images only +---------+---------------+---------+-----------+----------+-----------------+ PERO                                                  Color images only +---------+---------------+---------+-----------+----------+-----------------+ Limited distal leg evaluation due to pain and swelling . Color images only , No compression images due to pain.  +----+---------------+---------+-----------+----------+--------------+ LEFTCompressibilityPhasicitySpontaneityPropertiesThrombus Aging +----+---------------+---------+-----------+----------+--------------+ CFV                Yes      Yes                                 +----+---------------+---------+-----------+----------+--------------+     Summary: RIGHT: - There is no evidence of deep vein thrombosis in the lower extremity.  - No cystic structure found in the popliteal fossa.  LEFT: - No evidence of common femoral vein obstruction.  *See table(s) above for measurements and observations. Electronically signed by Monica Martinez MD on 08/15/2020 at 3:05:10 PM.    Final          Scheduled Meds: . atorvastatin  20 mg Oral Daily  . DULoxetine  40 mg Oral Daily  . ferrous sulfate  325 mg Oral BID WC  . gabapentin  200 mg Oral QHS  . insulin aspart  0-15 Units Subcutaneous TID WC  . insulin aspart  0-5 Units Subcutaneous QHS  . pantoprazole  40 mg Oral Daily  . Prednisol Ace-Moxiflox-Bromfen  1 drop Right Eye QID  . vitamin B-12  1,000 mcg Oral Daily   Continuous Infusions: . sodium chloride 150 mL/hr at 08/18/20 0942  . ceFEPime (MAXIPIME) IV 2 g (08/18/20 0951)  . vancomycin 1,250 mg  (08/18/20 0458)     LOS: 5 days    Time spent: 71 min    Emeterio Reeve, DO [facility]  If 7PM-7AM, please contact night-coverage [email or web address] 08/18/2020, 12:49 PM

## 2020-08-19 ENCOUNTER — Telehealth: Payer: Self-pay

## 2020-08-19 DIAGNOSIS — L0390 Cellulitis, unspecified: Secondary | ICD-10-CM | POA: Diagnosis not present

## 2020-08-19 DIAGNOSIS — A419 Sepsis, unspecified organism: Secondary | ICD-10-CM | POA: Diagnosis not present

## 2020-08-19 LAB — BASIC METABOLIC PANEL
Anion gap: 9 (ref 5–15)
BUN: 10 mg/dL (ref 8–23)
CO2: 19 mmol/L — ABNORMAL LOW (ref 22–32)
Calcium: 8.9 mg/dL (ref 8.9–10.3)
Chloride: 112 mmol/L — ABNORMAL HIGH (ref 98–111)
Creatinine, Ser: 0.68 mg/dL (ref 0.44–1.00)
GFR, Estimated: >60 mL/min (ref >60)
Glucose, Bld: 100 mg/dL — ABNORMAL HIGH (ref 70–99)
Potassium: 4.1 mmol/L (ref 3.5–5.1)
Sodium: 140 mmol/L (ref 135–145)

## 2020-08-19 LAB — GLUCOSE, CAPILLARY
Glucose-Capillary: 100 mg/dL — ABNORMAL HIGH (ref 70–99)
Glucose-Capillary: 112 mg/dL — ABNORMAL HIGH (ref 70–99)
Glucose-Capillary: 150 mg/dL — ABNORMAL HIGH (ref 70–99)
Glucose-Capillary: 83 mg/dL (ref 70–99)

## 2020-08-19 LAB — CBC
HCT: 28.8 % — ABNORMAL LOW (ref 36.0–46.0)
Hemoglobin: 8.3 g/dL — ABNORMAL LOW (ref 12.0–15.0)
MCH: 22.4 pg — ABNORMAL LOW (ref 26.0–34.0)
MCHC: 28.8 g/dL — ABNORMAL LOW (ref 30.0–36.0)
MCV: 77.8 fL — ABNORMAL LOW (ref 80.0–100.0)
Platelets: 444 10*3/uL — ABNORMAL HIGH (ref 150–400)
RBC: 3.7 MIL/uL — ABNORMAL LOW (ref 3.87–5.11)
RDW: 23.1 % — ABNORMAL HIGH (ref 11.5–15.5)
WBC: 20.1 10*3/uL — ABNORMAL HIGH (ref 4.0–10.5)
nRBC: 0.1 % (ref 0.0–0.2)

## 2020-08-19 MED ORDER — CEFAZOLIN SODIUM-DEXTROSE 2-4 GM/100ML-% IV SOLN
2.0000 g | Freq: Three times a day (TID) | INTRAVENOUS | Status: DC
  Administered 2020-08-19 – 2020-08-23 (×14): 2 g via INTRAVENOUS
  Filled 2020-08-19 (×13): qty 100

## 2020-08-19 NOTE — Care Management Important Message (Signed)
Important Message  Patient Details  Name: [name] MRN: [id number] Date of Birth: 23-Sep-1946   Medicare Important Message Given:  Yes     Orbie Pyo 08/19/2020, 4:21 PM

## 2020-08-19 NOTE — Telephone Encounter (Signed)
I was able to speak with pt about her labs of her hemoglobin being severely low. Pt has informed me that she is currently in the hospital and once she is DC'd from the ED she will call to make a HOSPITAL f/u appointment and also will address severe anemia concerns at this appointment as well.  **Dr. Jenny Reichmann has been made aware of the above information.

## 2020-08-19 NOTE — Progress Notes (Signed)
Physical Therapy Treatment Patient Details Name: [name] MRN: [id number] DOB: [birth year] Today's Date: 08/19/2020    History of Present Illness Pt is a 74 y.o. female admitted 08/12/20 with c/o lethargy, weakness and poor appetite that started after RLE blister burst 1-2 wks prior; pt also with recent fall. Workup for sepsis secondary to cellulitis. (-) BLE DVT. PMH includes GERD, HTN, HLD, OA, PE, chronic venous insufficiency.    PT Comments    Pt very pleasant and willing to mobilize however limited by pain, edema and weakness. Pt reports pain is intermittent but is able to progress slightly this session. Pt maintains arms propped on RW for gait and very slow 77ft/min with gait. Pt with assist to exit bed, stand and walk. Pt states family is not available 24hrs but that she does have a ramp and WC. For safe D/C home she would need 24hr assist for transfers and mobility or D/C to [facility]. Pt reports she will discuss with family. Will continue to follow.    Follow Up Recommendations  Home health PT;SNF;Supervision/Assistance - 24 hour     Equipment Recommendations  None recommended by PT    Recommendations for Other Services       Precautions / Restrictions Precautions Precautions: Fall Precaution Comments: edema and blisters RLE    Mobility  Bed Mobility Overal bed mobility: Needs Assistance Bed Mobility: Supine to Sit     Supine to sit: Mod assist;HOB elevated     General bed mobility comments: Assist with RLE and scooting bottom to EOB, increased time and cues.  Transfers Overall transfer level: Needs assistance   Transfers: Sit to/from Stand;Stand Pivot Transfers Sit to Stand: Mod assist;Min assist Stand pivot transfers: Mod assist       General transfer comment: cues for sequence with assist to rise. Pt mod assist to rise from bed, min assist to rise from [facility] with cues for hands on surface  Ambulation/Gait Ambulation/Gait assistance: Min guard Gait  Distance (Feet): 14 Feet Assistive device: Rolling walker (2 wheeled) Gait Pattern/deviations: Step-to pattern;Trunk flexed;Wide base of support;Antalgic;Decreased weight shift to right   Gait velocity interpretation: <1.31 ft/sec, indicative of household ambulator General Gait Details: pt maintains flexed posture with forearms propped on RW, cues for pushing through bil UE to extend trunk. Chair follow with extremely slow gait 1 ft/min   Stairs             Wheelchair Mobility    Modified Rankin (Stroke Patients Only)       Balance Overall balance assessment: Needs assistance   Sitting balance-Leahy Scale: Fair     Standing balance support: During functional activity Standing balance-Leahy Scale: Poor Standing balance comment: Pt leaning on forearms on RW handles and not able to stand upright today.                            Cognition Arousal/Alertness: Awake/alert Behavior During Therapy: WFL for tasks assessed/performed Overall Cognitive Status: Impaired/Different from baseline Area of Impairment: Safety/judgement                         Safety/Judgement: Decreased awareness of deficits     General Comments: slow processing with decreased awareness      Exercises      General Comments        Pertinent Vitals/Pain Pain Assessment: 0-10 Pain Location: bil LE Pain Descriptors / Indicators: Sore;Tender;Aching Pain Intervention(s): Limited activity within  patient's tolerance;Monitored during session;Premedicated before session;Repositioned    Home Living                      Prior Function            PT Goals (current goals can now be found in the care plan section) Progress towards PT goals: Progressing toward goals    Frequency    Min 3X/week      PT Plan Discharge plan needs to be updated    Co-evaluation              AM-PAC PT "6 Clicks" Mobility   Outcome Measure  Help needed turning from your  back to your side while in a flat bed without using bedrails?: A Little Help needed moving from lying on your back to sitting on the side of a flat bed without using bedrails?: A Lot Help needed moving to and from a bed to a chair (including a wheelchair)?: A Lot Help needed standing up from a chair using your arms (e.g., wheelchair or bedside chair)?: A Lot Help needed to walk in hospital room?: A Little Help needed climbing 3-5 steps with a railing? : Total 6 Click Score: 13    End of Session Equipment Utilized During Treatment: Gait belt Activity Tolerance: Patient tolerated treatment well Patient left: in chair;with call bell/phone within reach;with chair alarm set Nurse Communication: Mobility status PT Visit Diagnosis: Other abnormalities of gait and mobility (R26.89);Pain     Time: 3716-9678 PT Time Calculation (min) (ACUTE ONLY): 38 min  Charges:  $Gait Training: 23-37 mins $Therapeutic Activity: 8-22 mins                     Vestal Markin P, PT Acute Rehabilitation Services Pager: [phone] Office: [place] 08/19/2020, 8:25 AM

## 2020-08-19 NOTE — Progress Notes (Signed)
PROGRESS NOTE    [name]  GYJ:[id number] DOB: Sep 24, 1946 DOA: 08/12/2020 PCP: Biagio Borg, MD   Chief Complaint  Patient presents with  . Headache  . Dizziness   Brief Narrative: 74 year old female with GERD, HTN, HLD, osteoarthritis, chronic venous insufficiency, PE diagnosed in March 2021 was on Xarelto completed on 07/2020 presented to [name], ED with headache, lightheadedness and 1 to weeks history of blister on the anterior surface of her right leg that had burst and was having drainage in the area along with redness and swelling in the surrounding tissue. In the ED she was found to meet criteria for severe sepsis with fever, tachycardia, tachypnea, lactic acidosis along with right lower extremity cellulitis.  Placed on broad-spectrum antibiotics and was admitted.  Subjective: Patient reports she feels 30% better on the right lower extremity redness and swelling, still has right leg medial leukocytosis, blisters and swelling.   Assessment & Plan:  Severe Sepsis due to cellulitis on RLE: POA, met severe sepsis with significant leukocytosis 34k, tachycardia, lactic acidosis 2.5, fever of >100.4.  Currently afebrile, WBC downtrending.  Blood culture no growth so far.  Cefepime/vancomycin.  Right lower extremity redness improving but he still has area with blisters and swelling on the right medial thigh. Wound culture positive for staph epidermidis sensitive vancomycin, positive for  corynebacterium. slow to improve, right medial thigh with bullae.  CT on 10/30 showed subcutaneous edema within the right thigh and foreleg,no discrete drainable fluid collection identified. I will request ID input. Check CK. Monitor the demarcated area, please see the picture.  Cont wound care.  Essential hypertension: had episode of hypotension 08/17/20-currently stable.  Home Lasix and metoprolol on hold.  Severe anemia, microcytic as low as 6.8 g.  Received 2 units PRBC.  Hb has been  stable in 8 g range.  No black or tarry stool, FOBT  Was negative 08/13/20.Previously lower GI bleed in 2016 and had endoscopy/capsule endoscopy and colonoscopy positive for diverticulosis and hemorrhoids pathology negative for malignancy.  Will need outpatient GI evaluation. Recent Labs  Lab 08/16/20 0211 08/17/20 0224 08/17/20 1525 08/18/20 0600 08/19/20 1112  HGB 8.8* 8.6* 8.6* 7.7* 8.3*  HCT 30.1* 28.7* 28.4* 26.3* 78.5*   Metabolic acidosis with bicarb 19.  Monitor closely encourage hydration.  Continue gentle IV fluids cut down to 1 mL/hr. AKI creatinine peaked to 1.3 has improved to 0.6.  Monitor  Mixed hyperlipidemia: Continue statin.  Back pain with recent fall at home imaging negative for any acute fracture continue PT OT as tolerated.  GERD continue PPI.    Morbid obesity with BMI 38.4: will benefit with weight loss and healthy lifestyle.  Nutrition: Diet Order            Diet Heart Room service appropriate? Yes; Fluid consistency: Thin  Diet effective now                Body mass index is 38.43 kg/m.  DVT prophylaxis: Place and maintain sequential compression device Start: 08/13/20 0244 Code Status:   Code Status: DNR  Family Communication: plan of care discussed with patient at bedside.  Status is: Inpatient  Remains inpatient appropriate because:Inpatient level of care appropriate due to severity of illness and For ongoing management of right lower extremity wound   Dispo: The patient is from: Home by herself              Anticipated d/c is to: SNF  Anticipated d/c date is: > 3 days              Patient currently is not medically stable to d/c. Consultants:see note  Procedures:see note  Culture/Microbiology    Component Value Date/Time   SDES WOUND RIGHT LOWER LEG 08/13/2020 0243   SPECREQUEST NONE 08/13/2020 0243   CULT  08/13/2020 0243    MODERATE CORYNEBACTERIUM PSEUDODIPHTHERIAE Standardized susceptibility testing for this  organism is not available. RARE STAPHYLOCOCCUS EPIDERMIDIS    REPTSTATUS 08/16/2020 FINAL 08/13/2020 0243    Other culture-see note  Medications: Scheduled Meds: . atorvastatin  20 mg Oral Daily  . DULoxetine  40 mg Oral Daily  . ferrous sulfate  325 mg Oral BID WC  . gabapentin  200 mg Oral QHS  . insulin aspart  0-15 Units Subcutaneous TID WC  . insulin aspart  0-5 Units Subcutaneous QHS  . pantoprazole  40 mg Oral Daily  . Prednisol Ace-Moxiflox-Bromfen  1 drop Right Eye QID  . vitamin B-12  1,000 mcg Oral Daily   Continuous Infusions: . sodium chloride 150 mL/hr at 08/19/20 1223  . ceFEPime (MAXIPIME) IV 2 g (08/19/20 0901)  . vancomycin Stopped (08/19/20 0700)    Antimicrobials: Anti-infectives (From admission, onward)   Start     Dose/Rate Route Frequency Ordered Stop   08/18/20 0500  vancomycin (VANCOREADY) IVPB 1250 mg/250 mL        1,250 mg 166.7 mL/hr over 90 Minutes Intravenous Every 24 hours 08/17/20 2242     08/16/20 2200  ceFEPIme (MAXIPIME) 2 g in sodium chloride 0.9 % 100 mL IVPB        2 g 200 mL/hr over 30 Minutes Intravenous Every 12 hours 08/16/20 1822     08/15/20 1000  ceFEPIme (MAXIPIME) 2 g in sodium chloride 0.9 % 100 mL IVPB  Status:  Discontinued        2 g 200 mL/hr over 30 Minutes Intravenous Every 12 hours 08/15/20 0834 08/16/20 1558   08/13/20 2000  vancomycin (VANCOREADY) IVPB 750 mg/150 mL  Status:  Discontinued        750 mg 150 mL/hr over 60 Minutes Intravenous Every 12 hours 08/13/20 0336 08/17/20 2242   08/13/20 0245  vancomycin (VANCOCIN) IVPB 1000 mg/200 mL premix        1,000 mg 200 mL/hr over 60 Minutes Intravenous  Once 08/13/20 0242 08/13/20 0609   08/12/20 2030  cefTRIAXone (ROCEPHIN) 2 g in sodium chloride 0.9 % 100 mL IVPB  Status:  Discontinued        2 g 200 mL/hr over 30 Minutes Intravenous Every 24 hours 08/12/20 2016 08/13/20 0242     Objective: Vitals: Today's Vitals   08/18/20 2102 08/19/20 0714 08/19/20 0854  08/19/20 1228  BP: 123/67     Pulse: (!) 107     Resp: 16     Temp: 98 F (36.7 C)     TempSrc:      SpO2: 100%     Weight:      Height:      PainSc:  Asleep 0-No pain 0-No pain    Intake/Output Summary (Last 24 hours) at 08/19/2020 1408 Last data filed at 08/19/2020 0600 Gross per 24 hour  Intake 4212.03 ml  Output 850 ml  Net 3362.03 ml   Filed Weights   08/12/20 2156  Weight: 98.4 kg   Weight change:   Intake/Output from previous day: 10/31 0701 - 11/01 0700 In: 4212 [I.V.:3248.2; IV Piggyback:963.9] Out: 850 [Urine:850]  Intake/Output this shift: No intake/output data recorded.  Examination: General exam: AAOx3, obese ,NAD, weak appearing. HEENT:Oral mucosa moist, Ear/Nose WNL grossly,dentition normal. Respiratory system: bilaterally clear,no wheezing or crackles,no use of accessory muscle, non tender. Cardiovascular system: S1 & S2 +, regular, No JVD. Gastrointestinal system: Abdomen soft, NT,ND, BS+. Nervous System:Alert, awake, moving extremities and grossly nonfocal Extremities: No edema, distal peripheral pulses palpable.  Right lower extremity swollen erythematous but receding from the demarcated area, see picture with right medial thigh bullous lesion Skin: No rashes,no icterus. MSK: Normal muscle bulk,tone, power      Data Reviewed: I have personally reviewed following labs and imaging studies CBC: Recent Labs  Lab 08/12/20 2014 08/13/20 0435 08/16/20 0211 08/17/20 0224 08/17/20 1525 08/18/20 0600 08/19/20 1112  WBC 34.6*   < > 31.3* 28.7* 27.6* 21.3* 20.1*  NEUTROABS 31.9*  --   --   --   --   --   --   HGB 6.8*   < > 8.8* 8.6* 8.6* 7.7* 8.3*  HCT 24.1*   < > 30.1* 28.7* 28.4* 26.3* 28.8*  MCV 71.3*   < > 78.0* 76.7* 74.9* 76.9* 77.8*  PLT 542*   < > 373 405* 403* 365 444*   < > = values in this interval not displayed.   Basic Metabolic Panel: Recent Labs  Lab 08/13/20 0435 08/13/20 0435 08/14/20 0550 08/15/20 0359 08/16/20 0211  08/17/20 1525 08/19/20 1112  NA 135   < > 137 134* 135 137 140  K 3.7   < > 3.7 3.8 4.8 4.0 4.1  CL 101   < > 104 104 103 107 112*  CO2 22   < > 24 20* 19* 21* 19*  GLUCOSE 104*   < > 143* 141* 129* 128* 100*  BUN 16   < > 12 15 23  33* 10  CREATININE 1.01*   < > 1.03* 1.08* 1.35* 1.35* 0.68  CALCIUM 8.7*   < > 8.9 8.6* 8.6* 8.8* 8.9  MG 1.8  --  1.9  --   --   --   --    < > = values in this interval not displayed.   GFR: Estimated Creatinine Clearance: 70 mL/min (by C-G formula based on SCr of 0.68 mg/dL). Liver Function Tests: Recent Labs  Lab 08/12/20 2014 08/13/20 0435 08/14/20 0550 08/17/20 1525  AST 19 18 15  14*  ALT 15 15 15 16   ALKPHOS 86 82 85 95  BILITOT 1.0 1.3* 0.7 0.5  PROT 6.5 5.5* 5.6* 5.7*  ALBUMIN 3.5 2.8* 2.5* 2.2*   No results for input(s): LIPASE, AMYLASE in the last 168 hours. No results for input(s): AMMONIA in the last 168 hours. Coagulation Profile: Recent Labs  Lab 08/12/20 2014 08/13/20 0435  INR 1.1 1.2   Cardiac Enzymes: No results for input(s): CKTOTAL, CKMB, CKMBINDEX, TROPONINI in the last 168 hours. BNP (last 3 results) Recent Labs    12/11/19 1105  PROBNP 26.0   HbA1C: No results for input(s): HGBA1C in the last 72 hours. CBG: Recent Labs  Lab 08/18/20 1201 08/18/20 1623 08/18/20 2103 08/19/20 0739 08/19/20 1204  GLUCAP 108* 102* 100* 100* 112*   Lipid Profile: No results for input(s): CHOL, HDL, LDLCALC, TRIG, CHOLHDL, LDLDIRECT in the last 72 hours. Thyroid Function Tests: No results for input(s): TSH, T4TOTAL, FREET4, T3FREE, THYROIDAB in the last 72 hours. Anemia Panel: No results for input(s): VITAMINB12, FOLATE, FERRITIN, TIBC, IRON, RETICCTPCT in the last 72 hours. Sepsis Labs: Recent Labs  Lab 08/12/20 2014 08/12/20 2214 08/13/20 0435 08/17/20 1525  PROCALCITON  --   --  0.60  --   LATICACIDVEN 2.5* 1.9  --  1.5    Recent Results (from the past 240 hour(s))  Respiratory Panel by RT PCR (Flu A&B,  Covid) - Nasopharyngeal Swab     Status: None   Collection Time: 08/12/20  8:16 PM   Specimen: Nasopharyngeal Swab  Result Value Ref Range Status   SARS Coronavirus 2 by RT PCR NEGATIVE NEGATIVE Final    Comment: (NOTE) SARS-CoV-2 target nucleic acids are NOT DETECTED.  The SARS-CoV-2 RNA is generally detectable in upper respiratoy specimens during the acute phase of infection. The lowest concentration of SARS-CoV-2 viral copies this assay can detect is 131 copies/mL. A negative result does not preclude SARS-Cov-2 infection and should not be used as the sole basis for treatment or other patient management decisions. A negative result may occur with  improper specimen collection/handling, submission of specimen other than nasopharyngeal swab, presence of viral mutation(s) within the areas targeted by this assay, and inadequate number of viral copies (<131 copies/mL). A negative result must be combined with clinical observations, patient history, and epidemiological information. The expected result is Negative.  Fact Sheet for Patients:  PinkCheek.be  Fact Sheet for Healthcare Providers:  GravelBags.it  This test is no t yet approved or cleared by the [place] FDA and  has been authorized for detection and/or diagnosis of SARS-CoV-2 by FDA under an Emergency Use Authorization (EUA). This EUA will remain  in effect (meaning this test can be used) for the duration of the COVID-19 declaration under Section 564(b)(1) of the Act, 21 U.S.C. section 360bbb-3(b)(1), unless the authorization is terminated or revoked sooner.     Influenza A by PCR NEGATIVE NEGATIVE Final   Influenza B by PCR NEGATIVE NEGATIVE Final    Comment: (NOTE) The Xpert Xpress SARS-CoV-2/FLU/RSV assay is intended as an aid in  the diagnosis of influenza from Nasopharyngeal swab specimens and  should not be used as a sole basis for treatment. Nasal  washings and  aspirates are unacceptable for Xpert Xpress SARS-CoV-2/FLU/RSV  testing.  Fact Sheet for Patients: PinkCheek.be  Fact Sheet for Healthcare Providers: GravelBags.it  This test is not yet approved or cleared by the [place] FDA and  has been authorized for detection and/or diagnosis of SARS-CoV-2 by  FDA under an Emergency Use Authorization (EUA). This EUA will remain  in effect (meaning this test can be used) for the duration of the  Covid-19 declaration under Section 564(b)(1) of the Act, 21  U.S.C. section 360bbb-3(b)(1), unless the authorization is  terminated or revoked. Performed at [facility], [place] [address], [place], [place] [address]   Blood Culture (routine x 2)     Status: None   Collection Time: 08/12/20  8:18 PM   Specimen: BLOOD  Result Value Ref Range Status   Specimen Description BLOOD LEFT ANTECUBITAL  Final   Special Requests   Final    BOTTLES DRAWN AEROBIC AND ANAEROBIC Blood Culture adequate volume   Culture   Final    NO GROWTH 5 DAYS Performed at [facility], [place] [address], [place], [place] [address]    Report Status 08/17/2020 FINAL  Final  Blood Culture (routine x 2)     Status: None   Collection Time: 08/12/20  8:20 PM   Specimen: BLOOD  Result Value Ref Range Status   Specimen Description BLOOD RIGHT ANTECUBITAL  Final   Special Requests   Final    BOTTLES DRAWN AEROBIC AND ANAEROBIC Blood Culture results may not be optimal due to an excessive volume of blood received in culture bottles   Culture   Final    NO GROWTH 5 DAYS Performed at [facility], [place] [address] [place], [place] [address]    Report Status 08/17/2020 FINAL  Final  Urine culture     Status: Abnormal   Collection Time: 08/13/20 12:11 AM   Specimen: In/Out Cath Urine  Result Value Ref Range Status   Specimen Description IN/OUT CATH URINE  Final   Special Requests NONE  Final    Culture (A)  Final    <10,000 COLONIES/mL INSIGNIFICANT GROWTH Performed at [place] [address], [place], [place] [address]    Report Status 08/14/2020 FINAL  Final  Aerobic Culture (superficial specimen)     Status: None   Collection Time: 08/13/20  2:43 AM   Specimen: Wound  Result Value Ref Range Status   Specimen Description WOUND RIGHT LOWER LEG  Final   Special Requests NONE  Final   Gram Stain   Final    RARE WBC PRESENT, PREDOMINANTLY PMN NO ORGANISMS SEEN Performed at [facility], [place] [address] [place], [place] [address]    Culture   Final    MODERATE CORYNEBACTERIUM PSEUDODIPHTHERIAE Standardized susceptibility testing for this organism is not available. RARE STAPHYLOCOCCUS EPIDERMIDIS    Report Status 08/16/2020 FINAL  Final   Organism ID, Bacteria STAPHYLOCOCCUS EPIDERMIDIS  Final      Susceptibility   Staphylococcus epidermidis - MIC*    CIPROFLOXACIN >=8 RESISTANT Resistant     ERYTHROMYCIN >=8 RESISTANT Resistant     GENTAMICIN <=0.5 SENSITIVE Sensitive     OXACILLIN >=4 RESISTANT Resistant     TETRACYCLINE >=16 RESISTANT Resistant     VANCOMYCIN 1 SENSITIVE Sensitive     TRIMETH/SULFA <=10 SENSITIVE Sensitive     CLINDAMYCIN <=0.25 SENSITIVE Sensitive     RIFAMPIN <=0.5 SENSITIVE Sensitive     Inducible Clindamycin NEGATIVE Sensitive     * RARE STAPHYLOCOCCUS EPIDERMIDIS     Radiology Studies: CT FOOT RIGHT W CONTRAST  Result Date: 08/17/2020 CLINICAL DATA:  Cellulitis, epidermolysis of the right lower extremity EXAM: CT OF THE LOWER RIGHT EXTREMITY WITH CONTRAST CT OF THE RIGHT FOOT WITH CONTRAST CT OF THE RIGHT TIBIA AND FIBULA WITH CONTRAST TECHNIQUE: Multidetector CT imaging of the lower right extremity, right tibia and fibula, and right foot was performed according to the standard protocol following intravenous contrast administration. COMPARISON:  None. CONTRAST:  147m OMNIPAQUE IOHEXOL 350 MG/ML SOLN FINDINGS:  Bones/Joint/Cartilage Mild right hip degenerative arthritis. Status post right total knee arthroplasty with arthroplasty components in expected alignment. 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 No acute fracture or dislocation. No cortical erosion. No lytic or blastic bone lesion. Ligaments Suboptimally assessed by CT. Muscles and Tendons Mild relative fatty infiltration of the vastus lateralis. Otherwise no other significant findings. Soft tissues Moderate subcutaneous edema predominantly anteriorly and laterally within the proximal thigh and circumferentially within the mid right foreleg. No discrete drainable fluid collection identified. Postsurgical changes noted within the anterior distal thigh and proximal right foreleg related to total knee arthroplasty. Shotty adenopathy noted within the right inguinal region. Incidental note is made of multiple calcified involuted fibroids within the  uterus. IMPRESSION: Subcutaneous edema within the right thigh and foreleg. No discrete drainable fluid collection identified. No asymmetric muscular enlargement or infiltration suggest acute myositis. No cortical erosion to suggest osteomyelitis. Electronically Signed   By: Fidela Salisbury MD   On: 08/17/2020 20:22   CT FEMUR RIGHT W CONTRAST  Result Date: 08/17/2020 CLINICAL DATA:  Cellulitis, epidermolysis of the right lower extremity EXAM: CT OF THE LOWER RIGHT EXTREMITY WITH CONTRAST CT OF THE RIGHT FOOT WITH CONTRAST CT OF THE RIGHT TIBIA AND FIBULA WITH CONTRAST TECHNIQUE: Multidetector CT imaging of the lower right extremity, right tibia and fibula, and right foot was performed according to the standard protocol following intravenous contrast administration. COMPARISON:  None. CONTRAST:  141m OMNIPAQUE IOHEXOL 350 MG/ML SOLN FINDINGS:  Bones/Joint/Cartilage Mild right hip degenerative arthritis. Status post right total knee arthroplasty with arthroplasty components in expected alignment. 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 No acute fracture or dislocation. No cortical erosion. No lytic or blastic bone lesion. Ligaments Suboptimally assessed by CT. Muscles and Tendons Mild relative fatty infiltration of the vastus lateralis. Otherwise no other significant findings. Soft tissues Moderate subcutaneous edema predominantly anteriorly and laterally within the proximal thigh and circumferentially within the mid right foreleg. No discrete drainable fluid collection identified. Postsurgical changes noted within the anterior distal thigh and proximal right foreleg related to total knee arthroplasty. Shotty adenopathy noted within the right inguinal region. 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 IMPRESSION: Subcutaneous edema within the right thigh and foreleg. No discrete drainable fluid collection identified. No asymmetric muscular enlargement or infiltration suggest acute myositis. No cortical erosion to suggest osteomyelitis. Electronically Signed   By: AFidela SalisburyMD   On: 08/17/2020 20:22   CT TIBIA FIBULA RIGHT W CONTRAST  Result Date: 08/17/2020 CLINICAL DATA:  Cellulitis, epidermolysis of the right lower extremity EXAM: CT OF THE LOWER RIGHT EXTREMITY WITH CONTRAST CT OF THE RIGHT FOOT WITH CONTRAST CT OF THE RIGHT TIBIA AND FIBULA WITH CONTRAST TECHNIQUE: Multidetector CT imaging of the lower right extremity, right tibia and fibula, and right foot was performed according to the standard protocol following intravenous contrast administration. COMPARISON:  None. CONTRAST:  1025mOMNIPAQUE IOHEXOL 350 MG/ML SOLN FINDINGS:  Bones/Joint/Cartilage Mild right hip degenerative arthritis. Status post right total knee arthroplasty with arthroplasty components in expected alignment. 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 No acute fracture or dislocation. No cortical erosion. No lytic or blastic bone lesion. Ligaments Suboptimally assessed by CT. Muscles and Tendons Mild relative fatty infiltration of the vastus lateralis. Otherwise no other significant findings. Soft tissues Moderate subcutaneous edema predominantly anteriorly and laterally within the proximal thigh and circumferentially within the mid right foreleg. No discrete drainable fluid collection identified. Postsurgical changes noted within the anterior distal thigh and proximal right foreleg related to total knee arthroplasty. Shotty adenopathy noted within the right inguinal region. 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 IMPRESSION: Subcutaneous edema within the right thigh and foreleg. No discrete drainable fluid collection identified. No asymmetric muscular enlargement or infiltration suggest acute myositis. No cortical erosion to suggest osteomyelitis. Electronically Signed   By: AsFidela SalisburyD   On: 08/17/2020 20:22     LOS: 6 days  Antonieta Pert, MD [facility]  08/19/2020, 2:08 PM

## 2020-08-19 NOTE — Consult Note (Signed)
[place] for Infectious Disease       Reason for Consult: cellulitis    Referring Physician: Dr. Lupita Leash  Principal Problem:   Sepsis due to cellulitis [facility]) Active Problems:   Essential hypertension   Mixed hyperlipidemia   Cellulitis of right lower extremity   Injury of back due to fall   Microcytic anemia   Lactic acidosis   Sepsis ([place])   . atorvastatin  20 mg Oral Daily  . DULoxetine  40 mg Oral Daily  . ferrous sulfate  325 mg Oral BID WC  . gabapentin  200 mg Oral QHS  . insulin aspart  0-15 Units Subcutaneous TID WC  . insulin aspart  0-5 Units Subcutaneous QHS  . pantoprazole  40 mg Oral Daily  . Prednisol Ace-Moxiflox-Bromfen  1 drop Right Eye QID  . vitamin B-12  1,000 mcg Oral Daily    Recommendations: Cefazolin Stop vancomycin and cefepime   Assessment: She has had a recent new issue with bullous lesion on her right leg with itching and now with cellulitis.  I suspect she developed cellulitis from scratching her lesions. Her WBC on admission was 29 and did start to improve after addition of cefepime.  Typically these are Strep infections and ideal treatment with a cephalosporin and I will use cefazolin.  Will stop vancomycin.   She seems to have an underlying new bullous skin issue and should follow up with dermatology after hospitalization.   Antibiotics: Vancomycin, cefepime  HPI: [name] is a 74 y.o. female with chronic venous insufficiency PE now off Xarelto came in with headache and lightheadedness and labs concerning for sirs.  Right leg with concern for cellulitis.  Started initially on vancomycin and ceftriaxone and continued with vancomycin.  Her WBC remained up but with addition to cefepime started to improve, now down to 20.  No fever since cefepime was added.  She had a wound swab of her leg with Staph epi and Corynebacterium of unknown significance.  She feels she has slowly improved. Lactate was initially up but resolved  quickly. Did not require pressors or ICU stay.    Review of Systems:  Constitutional: negative for fevers and chills Gastrointestinal: negative for diarrhea All other systems reviewed and are negative    Past Medical History:  Diagnosis Date  . Allergic rhinitis, cause unspecified   . Allergy    SEASONAL  . Anemia   . Cataract    Bilateral  . Chronic headaches   . Constipation   . Diverticulosis   . DJD (degenerative joint disease), lumbar   . GERD (gastroesophageal reflux disease)   . Hemorrhoids   . Hiatal hernia   . HTN (hypertension)   . Hyperlipidemia   . Obesity   . Osteoarthritis   . Tubular adenoma of colon     Social History   Tobacco Use  . Smoking status: Never Smoker  . Smokeless tobacco: Never Used  Vaping Use  . Vaping Use: Never used  Substance Use Topics  . Alcohol use: No    Alcohol/week: 0.0 standard drinks  . Drug use: No    Family History  Problem Relation Age of Onset  . Heart disease Mother   . Hypertension Mother   . Diabetes Brother   . Breast cancer Sister   . Colon cancer Neg Hx   . Esophageal cancer Neg Hx   . Rectal cancer Neg Hx   . Stomach cancer Neg Hx     Allergies  Allergen Reactions  . 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 Sulfonamide Derivatives Itching and Rash    Physical Exam: Constitutional: in no apparent distress  Vitals:   08/18/20 2102 08/19/20 1430  BP: 123/67 (!) 121/51  Pulse: (!) 107 (!) 108  Resp: 16   Temp: 98 F (36.7 C) 97.9 F (36.6 C)  SpO2: 100%    EYES: anicteric Cardiovascular: Cor RRR Respiratory: clear; Musculoskeletal: right leg with edema, warmth, erythema Skin: no rash Neuro: non-focal  Lab Results  Component Value Date   WBC 20.1 (H) 08/19/2020   HGB 8.3 (L) 08/19/2020   HCT 28.8 (L) 08/19/2020   MCV 77.8 (L) 08/19/2020   PLT 444 (H) 08/19/2020    Lab Results  Component Value Date    CREATININE 0.68 08/19/2020   BUN 10 08/19/2020   NA 140 08/19/2020   K 4.1 08/19/2020   CL 112 (H) 08/19/2020   CO2 19 (L) 08/19/2020    Lab Results  Component Value Date   ALT 16 08/17/2020   AST 14 (L) 08/17/2020   ALKPHOS 95 08/17/2020     Microbiology: Recent Results (from the past 240 hour(s))  Respiratory Panel by RT PCR (Flu A&B, Covid) - Nasopharyngeal Swab     Status: None   Collection Time: 08/12/20  8:16 PM   Specimen: Nasopharyngeal Swab  Result Value Ref Range Status   SARS Coronavirus 2 by RT PCR NEGATIVE NEGATIVE Final    Comment: (NOTE) SARS-CoV-2 target nucleic acids are NOT DETECTED.  The SARS-CoV-2 RNA is generally detectable in upper respiratoy specimens during the acute phase of infection. The lowest concentration of SARS-CoV-2 viral copies this assay can detect is 131 copies/mL. A negative result does not preclude SARS-Cov-2 infection and should not be used as the sole basis for treatment or other patient management decisions. A negative result may occur with  improper specimen collection/handling, submission of specimen other than nasopharyngeal swab, presence of viral mutation(s) within the areas targeted by this assay, and inadequate number of viral copies (<131 copies/mL). A negative result must be combined with clinical observations, patient history, and epidemiological information. The expected result is Negative.  Fact Sheet for Patients:  PinkCheek.be  Fact Sheet for Healthcare Providers:  GravelBags.it  This test is no t yet approved or cleared by the [place] FDA and  has been authorized for detection and/or diagnosis of SARS-CoV-2 by FDA under an Emergency Use Authorization (EUA). This EUA will remain  in effect (meaning this test can be used) for the duration of the COVID-19 declaration under Section 564(b)(1) of the Act, 21 U.S.C. section 360bbb-3(b)(1), unless the  authorization is terminated or revoked sooner.     Influenza A by PCR NEGATIVE NEGATIVE Final   Influenza B by PCR NEGATIVE NEGATIVE Final    Comment: (NOTE) The Xpert Xpress SARS-CoV-2/FLU/RSV assay is intended as an aid in  the diagnosis of influenza from Nasopharyngeal swab specimens and  should not be used as a sole basis for treatment. Nasal washings and  aspirates are unacceptable for Xpert Xpress SARS-CoV-2/FLU/RSV  testing.  Fact Sheet for Patients: PinkCheek.be  Fact Sheet for Healthcare Providers: GravelBags.it  This test is not yet approved or cleared by the [place] FDA and  has been authorized for detection and/or diagnosis of SARS-CoV-2 by  FDA under an Emergency Use Authorization (EUA). This EUA will remain  in effect (meaning this test  can be used) for the duration of the  Covid-19 declaration under Section 564(b)(1) of the Act, 21  U.S.C. section 360bbb-3(b)(1), unless the authorization is  terminated or revoked. Performed at [facility], [place] [address] [place], [place] [address]   Blood Culture (routine x 2)     Status: None   Collection Time: 08/12/20  8:18 PM   Specimen: BLOOD  Result Value Ref Range Status   Specimen Description BLOOD LEFT ANTECUBITAL  Final   Special Requests   Final    BOTTLES DRAWN AEROBIC AND ANAEROBIC Blood Culture adequate volume   Culture   Final    NO GROWTH 5 DAYS Performed at [facility], [place] [address], [place], [place] [address]    Report Status 08/17/2020 FINAL  Final  Blood Culture (routine x 2)     Status: None   Collection Time: 08/12/20  8:20 PM   Specimen: BLOOD  Result Value Ref Range Status   Specimen Description BLOOD RIGHT ANTECUBITAL  Final   Special Requests   Final    BOTTLES DRAWN AEROBIC AND ANAEROBIC Blood Culture results may not be optimal due to an excessive volume of blood received in culture bottles   Culture   Final    NO  GROWTH 5 DAYS Performed at [facility], [place] [address], [place], [place] [address]    Report Status 08/17/2020 FINAL  Final  Urine culture     Status: Abnormal   Collection Time: 08/13/20 12:11 AM   Specimen: In/Out Cath Urine  Result Value Ref Range Status   Specimen Description IN/OUT CATH URINE  Final   Special Requests NONE  Final   Culture (A)  Final    <10,000 COLONIES/mL INSIGNIFICANT GROWTH Performed at [place] [address], [place], [place] [address]    Report Status 08/14/2020 FINAL  Final  Aerobic Culture (superficial specimen)     Status: None   Collection Time: 08/13/20  2:43 AM   Specimen: Wound  Result Value Ref Range Status   Specimen Description WOUND RIGHT LOWER LEG  Final   Special Requests NONE  Final   Gram Stain   Final    RARE WBC PRESENT, PREDOMINANTLY PMN NO ORGANISMS SEEN Performed at [facility], [place] [address], [place], [place] [address]    Culture   Final    MODERATE CORYNEBACTERIUM PSEUDODIPHTHERIAE Standardized susceptibility testing for this organism is not available. RARE STAPHYLOCOCCUS EPIDERMIDIS    Report Status 08/16/2020 FINAL  Final   Organism ID, Bacteria STAPHYLOCOCCUS EPIDERMIDIS  Final      Susceptibility   Staphylococcus epidermidis - MIC*    CIPROFLOXACIN >=8 RESISTANT Resistant     ERYTHROMYCIN >=8 RESISTANT Resistant     GENTAMICIN <=0.5 SENSITIVE Sensitive     OXACILLIN >=4 RESISTANT Resistant     TETRACYCLINE >=16 RESISTANT Resistant     VANCOMYCIN 1 SENSITIVE Sensitive     TRIMETH/SULFA <=10 SENSITIVE Sensitive     CLINDAMYCIN <=0.25 SENSITIVE Sensitive     RIFAMPIN <=0.5 SENSITIVE Sensitive     Inducible Clindamycin NEGATIVE Sensitive     * RARE STAPHYLOCOCCUS EPIDERMIDIS    Thayer Headings, [place] for Infectious Disease [place] [email or web address] 08/19/2020, 3:20 PM

## 2020-08-19 NOTE — Consult Note (Signed)
WOC Nurse Consult Note: Patient receiving care in [facility].  Consult completed remotely after review of record and images of thigh bulla area. Reason for Consult: "cellulitis with boullous skin on medial R thigh, consult to wound care for assistance managing this" Wound type: infectious Pressure Injury POA: Yes/No/NA Measurement: deferred Wound bed: yellowish in color with some areas with peeling skin Drainage (amount, consistency, odor) serosanginous Periwound: intact Dressing procedure/placement/frequency: Gently cleanse any intact or fluid filled bullae with soap and water. Pat dry. Place Xeroform gauzes Kellie Simmering [phone]) over the areas, secure with kerlex. Change daily and prn.  Moisten old dressings with saline prior to removing. Monitor the wound area(s) for worsening of condition such as: Signs/symptoms of infection,  Increase in size,  Development of or worsening of odor, Development of pain, or increased pain at the affected locations.  Notify the medical team if any of these develop.  Thank you for the consult.  [place] nurse will not follow at this time.  Please re-consult the [facility] team if needed.  Val Riles, RN, MSN, CWOCN, CNS-BC, pager [phone]

## 2020-08-20 DIAGNOSIS — A419 Sepsis, unspecified organism: Secondary | ICD-10-CM | POA: Diagnosis not present

## 2020-08-20 DIAGNOSIS — L0390 Cellulitis, unspecified: Secondary | ICD-10-CM | POA: Diagnosis not present

## 2020-08-20 LAB — CK: Total CK: 28 U/L — ABNORMAL LOW (ref 38–234)

## 2020-08-20 LAB — GLUCOSE, CAPILLARY
Glucose-Capillary: 91 mg/dL (ref 70–99)
Glucose-Capillary: 92 mg/dL (ref 70–99)
Glucose-Capillary: 87 mg/dL (ref 70–99)
Glucose-Capillary: 102 mg/dL — ABNORMAL HIGH (ref 70–99)
Glucose-Capillary: 94 mg/dL (ref 70–99)
Glucose-Capillary: 107 mg/dL — ABNORMAL HIGH (ref 70–99)
Glucose-Capillary: 96 mg/dL (ref 70–99)

## 2020-08-20 LAB — COMPREHENSIVE METABOLIC PANEL
ALT: 13 U/L (ref 0–44)
AST: 13 U/L — ABNORMAL LOW (ref 15–41)
Albumin: 2 g/dL — ABNORMAL LOW (ref 3.5–5.0)
Alkaline Phosphatase: 88 U/L (ref 38–126)
Anion gap: 10 (ref 5–15)
BUN: 8 mg/dL (ref 8–23)
CO2: 18 mmol/L — ABNORMAL LOW (ref 22–32)
Calcium: 8.9 mg/dL (ref 8.9–10.3)
Chloride: 112 mmol/L — ABNORMAL HIGH (ref 98–111)
Creatinine, Ser: 0.66 mg/dL (ref 0.44–1.00)
GFR, Estimated: >60 mL/min (ref >60)
Glucose, Bld: 94 mg/dL (ref 70–99)
Potassium: 3.9 mmol/L (ref 3.5–5.1)
Sodium: 140 mmol/L (ref 135–145)
Total Bilirubin: 0.7 mg/dL (ref 0.3–1.2)
Total Protein: 5.2 g/dL — ABNORMAL LOW (ref 6.5–8.1)

## 2020-08-20 LAB — CBC
HCT: 26.2 % — ABNORMAL LOW (ref 36.0–46.0)
Hemoglobin: 7.9 g/dL — ABNORMAL LOW (ref 12.0–15.0)
MCH: 23.3 pg — ABNORMAL LOW (ref 26.0–34.0)
MCHC: 30.2 g/dL (ref 30.0–36.0)
MCV: 77.3 fL — ABNORMAL LOW (ref 80.0–100.0)
Platelets: 413 10*3/uL — ABNORMAL HIGH (ref 150–400)
RBC: 3.39 MIL/uL — ABNORMAL LOW (ref 3.87–5.11)
RDW: 23.1 % — ABNORMAL HIGH (ref 11.5–15.5)
WBC: 16.3 10*3/uL — ABNORMAL HIGH (ref 4.0–10.5)
nRBC: 0.1 % (ref 0.0–0.2)

## 2020-08-20 MED ORDER — FUROSEMIDE 80 MG PO TABS
80.0000 mg | ORAL_TABLET | Freq: Two times a day (BID) | ORAL | Status: DC
  Administered 2020-08-20 – 2020-08-23 (×8): 80 mg via ORAL
  Filled 2020-08-20 (×8): qty 1

## 2020-08-20 NOTE — Progress Notes (Signed)
PROGRESS NOTE    [name]  DZH:[id number] DOB: [birth year] DOA: 08/12/2020 PCP: Biagio Borg, MD   Chief Complaint  Patient presents with  . Headache  . Dizziness   Brief Narrative: 74 year old female with GERD, HTN, HLD, osteoarthritis, chronic venous insufficiency, PE diagnosed in March 2021 was on Xarelto completed on 07/2020 presented to [name], ED with headache, lightheadedness and 1 to weeks history of blister on the anterior surface of her right leg that had burst and was having drainage in the area along with redness and swelling in the surrounding tissue. In the ED she was found to meet criteria for severe sepsis with fever, tachycardia, tachypnea, lactic acidosis along with right lower extremity cellulitis.  Placed on broad-spectrum antibiotics and was admitted.  Subjective:  WBC downtrending.  T-max 99.5 overnight. Feels right lower extremity is improving.  Assessment & Plan:  Severe Sepsis due to cellulitis on RLE: POA, met severe sepsis with significant leukocytosis 34k, tachycardia, lactic acidosis 2.5, fever of >100.4.  Currently afebrile, WBC downtrending.  Blood culture no growth so far.  Cefepime/vancomycin.  Right lower extremity redness improving but he still has area with blisters and swelling on the right medial thigh. Wound culture positive for staph epidermidis sensitive vancomycin, positive for  corynebacterium. slow to improve, right medial thigh with bullae.  CT on 10/30 showed subcutaneous edema within the right thigh and foreleg,no discrete drainable fluid collection identified.  Appreciate ID consult 11/1, antibiotics changed to cefazolin to cover for streptococcal infection as TYPICALLY  These are strep infection and ideal treatment is cephalosporin.Cont wound care.  Chronic diastolic CHF patient on Lasix at home, AKI has resolved so we will resume her Lasix 80 mg bid given in the setting of leg edema  Essential hypertension: had episode of  hypotension 08/17/20-blood pressures borderline 120s to 160s  Home Lasix and metoprolol on hold.  Severe anemia, microcytic as low as 6.8 g.  Received 2 units PRBC.  Hb has been stable in 8 g range.  No black or tarry stool, FOBT  Was negative 08/13/20.Previously lower GI bleed in 2016 and had endoscopy/capsule endoscopy and colonoscopy positive for diverticulosis and hemorrhoids pathology negative for malignancy.  Will need outpatient GI evaluation.  Monitor hemoglobin currently holding in 7 to 8 g range. Recent Labs  Lab 08/17/20 0224 08/17/20 1525 08/18/20 0600 08/19/20 1112 08/20/20 0439  HGB 8.6* 8.6* 7.7* 8.3* 7.9*  HCT 28.7* 28.4* 26.3* 41.9* 62.2*   Metabolic acidosis with bicarb 19>18.  With hyperkalemia, will cut down IV fluids.  Monitor  AKI creatinine peaked to 1.3 has improved to 0.6.  D/C IV fluids. Mixed hyperlipidemia: Continue statin. Back pain with recent fall at home imaging negative for any acute fracture continue PT OT as tolerated. GERD on PPI. Morbid obesity with BMI 38.4: will benefit with weight loss and healthy lifestyle.  Advise outpatient follow-up.  Nutrition: Diet Order            Diet Heart Room service appropriate? Yes; Fluid consistency: Thin  Diet effective now                Body mass index is 38.43 kg/m.  DVT prophylaxis: Place and maintain sequential compression device Start: 08/13/20 0244 Code Status:   Code Status: DNR  Family Communication: plan of care discussed with patient at bedside.  Status is: Inpatient  Remains inpatient appropriate because:Inpatient level of care appropriate due to severity of illness and For ongoing management of right lower extremity  wound   Dispo: The patient is from: Home by herself              Anticipated d/c is to: SNF, continue PT OT.              Anticipated d/c date is: 2 days              Patient currently is not medically stable to d/c. Consultants:see note  Procedures:see  note  Culture/Microbiology    Component Value Date/Time   SDES WOUND RIGHT LOWER LEG 08/13/2020 0243   SPECREQUEST NONE 08/13/2020 0243   CULT  08/13/2020 0243    MODERATE CORYNEBACTERIUM PSEUDODIPHTHERIAE Standardized susceptibility testing for this organism is not available. RARE STAPHYLOCOCCUS EPIDERMIDIS    REPTSTATUS 08/16/2020 FINAL 08/13/2020 0243    Other culture-see note  Medications: Scheduled Meds: . atorvastatin  20 mg Oral Daily  . DULoxetine  40 mg Oral Daily  . ferrous sulfate  325 mg Oral BID WC  . gabapentin  200 mg Oral QHS  . insulin aspart  0-15 Units Subcutaneous TID WC  . insulin aspart  0-5 Units Subcutaneous QHS  . pantoprazole  40 mg Oral Daily  . vitamin B-12  1,000 mcg Oral Daily   Continuous Infusions: . sodium chloride 100 mL/hr at 08/20/20 0553  .  ceFAZolin (ANCEF) IV 2 g (08/20/20 0210)    Antimicrobials: Anti-infectives (From admission, onward)   Start     Dose/Rate Route Frequency Ordered Stop   08/19/20 1630  ceFAZolin (ANCEF) IVPB 2g/100 mL premix        2 g 200 mL/hr over 30 Minutes Intravenous Every 8 hours 08/19/20 1520     08/18/20 0500  vancomycin (VANCOREADY) IVPB 1250 mg/250 mL  Status:  Discontinued        1,250 mg 166.7 mL/hr over 90 Minutes Intravenous Every 24 hours 08/17/20 2242 08/19/20 1520   08/16/20 2200  ceFEPIme (MAXIPIME) 2 g in sodium chloride 0.9 % 100 mL IVPB  Status:  Discontinued        2 g 200 mL/hr over 30 Minutes Intravenous Every 12 hours 08/16/20 1822 08/19/20 1520   08/15/20 1000  ceFEPIme (MAXIPIME) 2 g in sodium chloride 0.9 % 100 mL IVPB  Status:  Discontinued        2 g 200 mL/hr over 30 Minutes Intravenous Every 12 hours 08/15/20 0834 08/16/20 1558   08/13/20 2000  vancomycin (VANCOREADY) IVPB 750 mg/150 mL  Status:  Discontinued        750 mg 150 mL/hr over 60 Minutes Intravenous Every 12 hours 08/13/20 0336 08/17/20 2242   08/13/20 0245  vancomycin (VANCOCIN) IVPB 1000 mg/200 mL premix         1,000 mg 200 mL/hr over 60 Minutes Intravenous  Once 08/13/20 0242 08/13/20 0609   08/12/20 2030  cefTRIAXone (ROCEPHIN) 2 g in sodium chloride 0.9 % 100 mL IVPB  Status:  Discontinued        2 g 200 mL/hr over 30 Minutes Intravenous Every 24 hours 08/12/20 2016 08/13/20 0242     Objective: Vitals: Today's Vitals   08/20/20 0528 08/20/20 0652 08/20/20 0711 08/20/20 0822  BP: (!) 167/73   (!) 144/68  Pulse: (!) 106   (!) 212  Resp: 20   14  Temp: 99.4 F (37.4 C)   98.6 F (37 C)  TempSrc:      SpO2: 100%   (!) 65%  Weight:      Height:  PainSc:  2  Asleep     Intake/Output Summary (Last 24 hours) at 08/20/2020 0823 Last data filed at 08/20/2020 0600 Gross per 24 hour  Intake 100 ml  Output 1 ml  Net 99 ml   Filed Weights   08/12/20 2156  Weight: 98.4 kg   Weight change:   Intake/Output from previous day: 11/01 0701 - 11/02 0700 In: 100 [IV Piggyback:100] Out: 1 [Stool:1] Intake/Output this shift: No intake/output data recorded.  Examination: General exam: AAO x3, obese, NAD, weak appearing. HEENT:Oral mucosa moist, Ear/Nose WNL grossly, dentition normal. Respiratory system: bilaterally clear,no wheezing or crackles,no use of accessory muscle Cardiovascular system: S1 & S2 +, No JVD,. Gastrointestinal system: Abdomen soft, NT,ND, BS+ Nervous System:Alert, awake, moving extremities and grossly nonfocal Extremities: Right lower extremity cellulitis and bullous lesion in the medial thigh dressing intact, anterior and lateral redness swelling is improving.  Skin: No rashes,no icterus. MSK: Normal muscle bulk,tone, power   Data Reviewed: I have personally reviewed following labs and imaging studies CBC: Recent Labs  Lab 08/17/20 0224 08/17/20 1525 08/18/20 0600 08/19/20 1112 08/20/20 0439  WBC 28.7* 27.6* 21.3* 20.1* 16.3*  HGB 8.6* 8.6* 7.7* 8.3* 7.9*  HCT 28.7* 28.4* 26.3* 28.8* 26.2*  MCV 76.7* 74.9* 76.9* 77.8* 77.3*  PLT 405* 403* 365 444* 413*    Basic Metabolic Panel: Recent Labs  Lab 08/14/20 0550 08/14/20 0550 08/15/20 0359 08/16/20 0211 08/17/20 1525 08/19/20 1112 08/20/20 0439  NA 137   < > 134* 135 137 140 140  K 3.7   < > 3.8 4.8 4.0 4.1 3.9  CL 104   < > 104 103 107 112* 112*  CO2 24   < > 20* 19* 21* 19* 18*  GLUCOSE 143*   < > 141* 129* 128* 100* 94  BUN 12   < > 15 23 33* 10 8  CREATININE 1.03*   < > 1.08* 1.35* 1.35* 0.68 0.66  CALCIUM 8.9   < > 8.6* 8.6* 8.8* 8.9 8.9  MG 1.9  --   --   --   --   --   --    < > = values in this interval not displayed.   GFR: Estimated Creatinine Clearance: 70 mL/min (by C-G formula based on SCr of 0.66 mg/dL). Liver Function Tests: Recent Labs  Lab 08/14/20 0550 08/17/20 1525 08/20/20 0439  AST 15 14* 13*  ALT 15 16 13   ALKPHOS 85 95 88  BILITOT 0.7 0.5 0.7  PROT 5.6* 5.7* 5.2*  ALBUMIN 2.5* 2.2* 2.0*   No results for input(s): LIPASE, AMYLASE in the last 168 hours. No results for input(s): AMMONIA in the last 168 hours. Coagulation Profile: No results for input(s): INR, PROTIME in the last 168 hours. Cardiac Enzymes: Recent Labs  Lab 08/20/20 0439  CKTOTAL 28*   BNP (last 3 results) Recent Labs    12/11/19 1105  PROBNP 26.0   HbA1C: No results for input(s): HGBA1C in the last 72 hours. CBG: Recent Labs  Lab 08/19/20 1204 08/19/20 1559 08/19/20 2136 08/20/20 0727 08/20/20 0821  GLUCAP 112* 150* 83 91 92   Lipid Profile: No results for input(s): CHOL, HDL, LDLCALC, TRIG, CHOLHDL, LDLDIRECT in the last 72 hours. Thyroid Function Tests: No results for input(s): TSH, T4TOTAL, FREET4, T3FREE, THYROIDAB in the last 72 hours. Anemia Panel: No results for input(s): VITAMINB12, FOLATE, FERRITIN, TIBC, IRON, RETICCTPCT in the last 72 hours. Sepsis Labs: Recent Labs  Lab 08/17/20 1525  LATICACIDVEN 1.5  Recent Results (from the past 240 hour(s))  Respiratory Panel by RT PCR (Flu A&B, Covid) - Nasopharyngeal Swab     Status: None    Collection Time: 08/12/20  8:16 PM   Specimen: Nasopharyngeal Swab  Result Value Ref Range Status   SARS Coronavirus 2 by RT PCR NEGATIVE NEGATIVE Final    Comment: (NOTE) SARS-CoV-2 target nucleic acids are NOT DETECTED.  The SARS-CoV-2 RNA is generally detectable in upper respiratoy specimens during the acute phase of infection. The lowest concentration of SARS-CoV-2 viral copies this assay can detect is 131 copies/mL. A negative result does not preclude SARS-Cov-2 infection and should not be used as the sole basis for treatment or other patient management decisions. A negative result may occur with  improper specimen collection/handling, submission of specimen other than nasopharyngeal swab, presence of viral mutation(s) within the areas targeted by this assay, and inadequate number of viral copies (<131 copies/mL). A negative result must be combined with clinical observations, patient history, and epidemiological information. The expected result is Negative.  Fact Sheet for Patients:  PinkCheek.be  Fact Sheet for Healthcare Providers:  GravelBags.it  This test is no t yet approved or cleared by the [place] FDA and  has been authorized for detection and/or diagnosis of SARS-CoV-2 by FDA under an Emergency Use Authorization (EUA). This EUA will remain  in effect (meaning this test can be used) for the duration of the COVID-19 declaration under Section 564(b)(1) of the Act, 21 U.S.C. section 360bbb-3(b)(1), unless the authorization is terminated or revoked sooner.     Influenza A by PCR NEGATIVE NEGATIVE Final   Influenza B by PCR NEGATIVE NEGATIVE Final    Comment: (NOTE) The Xpert Xpress SARS-CoV-2/FLU/RSV assay is intended as an aid in  the diagnosis of influenza from Nasopharyngeal swab specimens and  should not be used as a sole basis for treatment. Nasal washings and  aspirates are unacceptable for Xpert  Xpress SARS-CoV-2/FLU/RSV  testing.  Fact Sheet for Patients: PinkCheek.be  Fact Sheet for Healthcare Providers: GravelBags.it  This test is not yet approved or cleared by the [place] FDA and  has been authorized for detection and/or diagnosis of SARS-CoV-2 by  FDA under an Emergency Use Authorization (EUA). This EUA will remain  in effect (meaning this test can be used) for the duration of the  Covid-19 declaration under Section 564(b)(1) of the Act, 21  U.S.C. section 360bbb-3(b)(1), unless the authorization is  terminated or revoked. Performed at [facility], [place] [address] [place], [place] [address]   Blood Culture (routine x 2)     Status: None   Collection Time: 08/12/20  8:18 PM   Specimen: BLOOD  Result Value Ref Range Status   Specimen Description BLOOD LEFT ANTECUBITAL  Final   Special Requests   Final    BOTTLES DRAWN AEROBIC AND ANAEROBIC Blood Culture adequate volume   Culture   Final    NO GROWTH 5 DAYS Performed at [facility], [place] [address], [place], [place] [address]    Report Status 08/17/2020 FINAL  Final  Blood Culture (routine x 2)     Status: None   Collection Time: 08/12/20  8:20 PM   Specimen: BLOOD  Result Value Ref Range Status   Specimen Description BLOOD RIGHT ANTECUBITAL  Final   Special Requests   Final    BOTTLES DRAWN AEROBIC AND ANAEROBIC Blood Culture results may not be optimal due to an excessive volume of blood received in  culture bottles   Culture   Final    NO GROWTH 5 DAYS Performed at [facility], [place] [address] [place], [place] [address]    Report Status 08/17/2020 FINAL  Final  Urine culture     Status: Abnormal   Collection Time: 08/13/20 12:11 AM   Specimen: In/Out Cath Urine  Result Value Ref Range Status   Specimen Description IN/OUT CATH URINE  Final   Special Requests NONE  Final   Culture (A)  Final    <10,000 COLONIES/mL  INSIGNIFICANT GROWTH Performed at [place] [address], [place], [place] [address]    Report Status 08/14/2020 FINAL  Final  Aerobic Culture (superficial specimen)     Status: None   Collection Time: 08/13/20  2:43 AM   Specimen: Wound  Result Value Ref Range Status   Specimen Description WOUND RIGHT LOWER LEG  Final   Special Requests NONE  Final   Gram Stain   Final    RARE WBC PRESENT, PREDOMINANTLY PMN NO ORGANISMS SEEN Performed at [facility], [place] [address], [place], [place] [address]    Culture   Final    MODERATE CORYNEBACTERIUM PSEUDODIPHTHERIAE Standardized susceptibility testing for this organism is not available. RARE STAPHYLOCOCCUS EPIDERMIDIS    Report Status 08/16/2020 FINAL  Final   Organism ID, Bacteria STAPHYLOCOCCUS EPIDERMIDIS  Final      Susceptibility   Staphylococcus epidermidis - MIC*    CIPROFLOXACIN >=8 RESISTANT Resistant     ERYTHROMYCIN >=8 RESISTANT Resistant     GENTAMICIN <=0.5 SENSITIVE Sensitive     OXACILLIN >=4 RESISTANT Resistant     TETRACYCLINE >=16 RESISTANT Resistant     VANCOMYCIN 1 SENSITIVE Sensitive     TRIMETH/SULFA <=10 SENSITIVE Sensitive     CLINDAMYCIN <=0.25 SENSITIVE Sensitive     RIFAMPIN <=0.5 SENSITIVE Sensitive     Inducible Clindamycin NEGATIVE Sensitive     * RARE STAPHYLOCOCCUS EPIDERMIDIS     Radiology Studies: No results found.   LOS: 7 days   Antonieta Pert, MD [facility]  08/20/2020, 8:23 AM

## 2020-08-20 NOTE — Progress Notes (Signed)
[facility] for Infect[facility]   Reason for visit: Follow up on cellulitis  Interval History: feels better, WBC down to 16.3, afebrile.    Physical Exam: Constitutional:  Vitals:   08/20/20 0528 08/20/20 0822  BP: (!) 167/73 (!) 144/68  Pulse: (!) 106 97  Resp: 20 14  Temp: 99.4 F (37.4 C) 98.6 F (37 C)  SpO2: 100% 100%   patient appears in NAD Respiratory: Normal respiratory effort; CTA B Cardiovascular: RRR MS: right leg with less erythema, less warmth, lesions on anterior leg dry  Review of Systems: Constitutional: negative for fevers and chills Gastrointestinal: negative for diarrhea  Lab Results  Component Value Date   WBC 16.3 (H) 08/20/2020   HGB 7.9 (L) 08/20/2020   HCT 26.2 (L) 08/20/2020   MCV 77.3 (L) 08/20/2020   PLT 413 (H) 08/20/2020    Lab Results  Component Value Date   CREATININE 0.66 08/20/2020   BUN 8 08/20/2020   NA 140 08/20/2020   K 3.9 08/20/2020   CL 112 (H) 08/20/2020   CO2 18 (L) 08/20/2020    Lab Results  Component Value Date   ALT 13 08/20/2020   AST 13 (L) 08/20/2020   ALKPHOS 88 08/20/2020     Microbiology: Recent Results (from the past 240 hour(s))  Respiratory Panel by RT PCR (Flu A&B, Covid) - Nasopharyngeal Swab     Status: None   Collection Time: 08/12/20  8:16 PM   Specimen: Nasopharyngeal Swab  Result Value Ref Range Status   SARS Coronavirus 2 by RT PCR NEGATIVE NEGATIVE Final    Comment: (NOTE) SARS-CoV-2 target nucleic acids are NOT DETECTED.  The SARS-CoV-2 RNA is generally detectable in upper respiratoy specimens during the acute phase of infection. The lowest concentration of SARS-CoV-2 viral copies this assay can detect is 131 copies/mL. A negative result does not preclude SARS-Cov-2 infection and should not be used as the sole basis for treatment or other patient management decisions. A negative result may occur with  improper specimen collection/handling, submission of specimen other than  nasopharyngeal swab, presence of viral mutation(s) within the areas targeted by this assay, and inadequate number of viral copies (<131 copies/mL). A negative result must be combined with clinical observations, patient history, and epidemiological information. The expected result is Negative.  Fact Sheet for Patients:  PinkCheek.be  Fact Sheet for Healthcare Providers:  GravelBags.it  This test is no t yet approved or cleared by the [place] FDA and  has been authorized for detection and/or diagnosis of SARS-CoV-2 by FDA under an Emergency Use Authorization (EUA). This EUA will remain  in effect (meaning this test can be used) for the duration of the COVID-19 declaration under Section 564(b)(1) of the Act, 21 U.S.C. section 360bbb-3(b)(1), unless the authorization is terminated or revoked sooner.     Influenza A by PCR NEGATIVE NEGATIVE Final   Influenza B by PCR NEGATIVE NEGATIVE Final    Comment: (NOTE) The Xpert Xpress SARS-CoV-2/FLU/RSV assay is intended as an aid in  the diagnosis of influenza from Nasopharyngeal swab specimens and  should not be used as a sole basis for treatment. Nasal washings and  aspirates are unacceptable for Xpert Xpress SARS-CoV-2/FLU/RSV  testing.  Fact Sheet for Patients: PinkCheek.be  Fact Sheet for Healthcare Providers: GravelBags.it  This test is not yet approved or cleared by the [place] FDA and  has been authorized for detection and/or diagnosis of SARS-CoV-2 by  FDA under an Emergency Use Authorization (EUA).  This EUA will remain  in effect (meaning this test can be used) for the duration of the  Covid-19 declaration under Section 564(b)(1) of the Act, 21  U.S.C. section 360bbb-3(b)(1), unless the authorization is  terminated or revoked. Performed at [facility], [place] [address], [place],  [place] [address]   Blood Culture (routine x 2)     Status: None   Collection Time: 08/12/20  8:18 PM   Specimen: BLOOD  Result Value Ref Range Status   Specimen Description BLOOD LEFT ANTECUBITAL  Final   Special Requests   Final    BOTTLES DRAWN AEROBIC AND ANAEROBIC Blood Culture adequate volume   Culture   Final    NO GROWTH 5 DAYS Performed at [facility], [place] [address], [place], [place] [address]    Report Status 08/17/2020 FINAL  Final  Blood Culture (routine x 2)     Status: None   Collection Time: 08/12/20  8:20 PM   Specimen: BLOOD  Result Value Ref Range Status   Specimen Description BLOOD RIGHT ANTECUBITAL  Final   Special Requests   Final    BOTTLES DRAWN AEROBIC AND ANAEROBIC Blood Culture results may not be optimal due to an excessive volume of blood received in culture bottles   Culture   Final    NO GROWTH 5 DAYS Performed at [facility], [place] [address] [place], [place] [address]    Report Status 08/17/2020 FINAL  Final  Urine culture     Status: Abnormal   Collection Time: 08/13/20 12:11 AM   Specimen: In/Out Cath Urine  Result Value Ref Range Status   Specimen Description IN/OUT CATH URINE  Final   Special Requests NONE  Final   Culture (A)  Final    <10,000 COLONIES/mL INSIGNIFICANT GROWTH Performed at [facility] [address], [place], [place] [address]    Report Status 08/14/2020 FINAL  Final  Aerobic Culture (superficial specimen)     Status: None   Collection Time: 08/13/20  2:43 AM   Specimen: Wound  Result Value Ref Range Status   Specimen Description WOUND RIGHT LOWER LEG  Final   Special Requests NONE  Final   Gram Stain   Final    RARE WBC PRESENT, PREDOMINANTLY PMN NO ORGANISMS SEEN Performed at [facility], [place] [address], [place], [place] [address]    Culture   Final    MODERATE CORYNEBACTERIUM PSEUDODIPHTHERIAE Standardized susceptibility testing for this organism is not available. RARE STAPHYLOCOCCUS  EPIDERMIDIS    Report Status 08/16/2020 FINAL  Final   Organism ID, Bacteria STAPHYLOCOCCUS EPIDERMIDIS  Final      Susceptibility   Staphylococcus epidermidis - MIC*    CIPROFLOXACIN >=8 RESISTANT Resistant     ERYTHROMYCIN >=8 RESISTANT Resistant     GENTAMICIN <=0.5 SENSITIVE Sensitive     OXACILLIN >=4 RESISTANT Resistant     TETRACYCLINE >=16 RESISTANT Resistant     VANCOMYCIN 1 SENSITIVE Sensitive     TRIMETH/SULFA <=10 SENSITIVE Sensitive     CLINDAMYCIN <=0.25 SENSITIVE Sensitive     RIFAMPIN <=0.5 SENSITIVE Sensitive     Inducible Clindamycin NEGATIVE Sensitive     * RARE STAPHYLOCOCCUS EPIDERMIDIS    Impression/Plan:  1. Cellulitis - improving on cefazolin.  Would treat another 7 days through 11/8 and can stop.  Can use Keflex 500 mg 4 times a day.    2.  Bullous skin lesions - unclear etiology and she should  follow up with dermatology.    3.  Leukocytosis - improving and due to #1.    I will sign off.  She can follow up with her PCP and dermatology.   Thanks for consultation.

## 2020-08-20 NOTE — Progress Notes (Signed)
Occupational Therapy Treatment Patient Details Name: [name] MRN: [id number] DOB: 1946/04/17 Today's Date: 08/20/2020    History of present illness Pt is a 74 y.o. female admitted 08/12/20 with c/o lethargy, weakness and poor appetite that started after RLE blister burst 1-2 wks prior; pt also with recent fall. Workup for sepsis secondary to cellulitis. (-) BLE DVT. PMH includes GERD, HTN, HLD, OA, PE, chronic venous insufficiency.   OT comments  Patient continues to make progress towards goals in skilled OT session. Patient's session encompassed functional mobility to complete ADLs and transfers. Pt able to complete bed mobility with significant extra time (assumed to be baseline) and transfer to [facility] with mod A (power up and steadying). Pt propped on forearms to ambulate the majority of the session, and unable to complete peri-care due to diminished balance. Discharge remains appropriate, will continue to follow acutely.    Follow Up Recommendations  Home health OT;Supervision/Assistance - 24 hour    Equipment Recommendations  3 in 1 bedside commode    Recommendations for Other Services      Precautions / Restrictions Precautions Precautions: Fall Precaution Comments: edema and blisters RLE Restrictions Weight Bearing Restrictions: No       Mobility Bed Mobility Overal bed mobility: Needs Assistance Bed Mobility: Supine to Sit     Supine to sit: Mod assist;HOB elevated     General bed mobility comments: Assist with RLE and scooting bottom to EOB, increased time and cues.  Transfers Overall transfer level: Needs assistance Equipment used: Rolling walker (2 wheeled) Transfers: Sit to/from [facility] Sit to Stand: Mod assist;Min assist Stand pivot transfers: Mod assist       General transfer comment: cues for sequence with assist to rise. Pt mod assist to rise from bed, min assist to rise from [facility] with cues for hands on surface    Balance  Overall balance assessment: Needs assistance   Sitting balance-Leahy Scale: Fair     Standing balance support: During functional activity Standing balance-Leahy Scale: Poor Standing balance comment: Pt leaning on forearms on RW handles and not able to stand upright today.                           ADL either performed or assessed with clinical judgement   ADL Overall ADL's : Needs assistance/impaired                     Lower Body Dressing: Total assistance Lower Body Dressing Details (indicate cue type and reason): to don socks Toilet Transfer: Min Press photographer Details (indicate cue type and reason): able to complete, but takes a significant amount of time Toileting- Clothing Manipulation and Hygiene: Total assistance;Sit to/from stand Toileting - Clothing Manipulation Details (indicate cue type and reason): unable to balance and complete peri care to date (forearms propped on RW)     Functional mobility during ADLs: Moderate assistance;Rolling walker General ADL Comments: Pt with increased need for assist with peri care to date, but is slow moving with all purposeful activity (believed to be baseline)     Vision       Perception     Praxis      Cognition Arousal/Alertness: Awake/alert Behavior During Therapy: WFL for tasks assessed/performed Overall Cognitive Status: Impaired/Different from baseline Area of Impairment: Safety/judgement  Safety/Judgement: Decreased awareness of deficits     General Comments: slow processing with decreased awareness        Exercises     Shoulder Instructions       General Comments      Pertinent Vitals/ Pain       Pain Assessment: Faces Faces Pain Scale: Hurts little more Pain Location: bil LE Pain Descriptors / Indicators: Sore;Tender;Aching Pain Intervention(s): Limited activity within patient's tolerance;Monitored during  session;Repositioned  Home Living                                          Prior Functioning/Environment              Frequency  Min 2X/week        Progress Toward Goals  OT Goals(current goals can now be found in the care plan section)  Progress towards OT goals: Progressing toward goals  Acute Rehab OT Goals Patient Stated Goal: Return home with son who can help OT Goal Formulation: With patient Time For Goal Achievement: 08/29/20 Potential to Achieve Goals: Good  Plan Discharge plan remains appropriate    Co-evaluation                 AM-PAC OT "6 Clicks" Daily Activity     Outcome Measure   Help from another person eating meals?: None Help from another person taking care of personal grooming?: A Little Help from another person toileting, which includes using toliet, bedpan, or urinal?: A Lot Help from another person bathing (including washing, rinsing, drying)?: A Little Help from another person to put on and taking off regular upper body clothing?: A Little Help from another person to put on and taking off regular lower body clothing?: A Lot 6 Click Score: 17    End of Session Equipment Utilized During Treatment: Gait belt;Rolling walker  OT Visit Diagnosis: Unsteadiness on feet (R26.81);Other abnormalities of gait and mobility (R26.89);Muscle weakness (generalized) (M62.81);Pain Pain - Right/Left: Right Pain - part of body: Leg   Activity Tolerance Patient tolerated treatment well;Patient limited by fatigue   Patient Left in chair;with call bell/phone within reach;with chair alarm set   Nurse Communication Mobility status;Other (comment) (needs new purewick)        Time: 6578-4696 OT Time Calculation (min): 30 min  Charges: OT General Charges $OT Visit: 1 Visit OT Treatments $Self Care/Home Management : 23-37 mins  Corinne [place], [place] [facility] [place] 08/20/2020, 1:14 PM

## 2020-08-21 DIAGNOSIS — A419 Sepsis, unspecified organism: Secondary | ICD-10-CM | POA: Diagnosis not present

## 2020-08-21 DIAGNOSIS — L0390 Cellulitis, unspecified: Secondary | ICD-10-CM | POA: Diagnosis not present

## 2020-08-21 LAB — COMPREHENSIVE METABOLIC PANEL
ALT: 12 U/L (ref 0–44)
AST: 15 U/L (ref 15–41)
Albumin: 2.1 g/dL — ABNORMAL LOW (ref 3.5–5.0)
Alkaline Phosphatase: 83 U/L (ref 38–126)
Anion gap: 11 (ref 5–15)
BUN: 6 mg/dL — ABNORMAL LOW (ref 8–23)
CO2: 25 mmol/L (ref 22–32)
Calcium: 9 mg/dL (ref 8.9–10.3)
Chloride: 104 mmol/L (ref 98–111)
Creatinine, Ser: 0.82 mg/dL (ref 0.44–1.00)
GFR, Estimated: >60 mL/min (ref >60)
Glucose, Bld: 101 mg/dL — ABNORMAL HIGH (ref 70–99)
Potassium: 3.4 mmol/L — ABNORMAL LOW (ref 3.5–5.1)
Sodium: 140 mmol/L (ref 135–145)
Total Bilirubin: 0.5 mg/dL (ref 0.3–1.2)
Total Protein: 5.3 g/dL — ABNORMAL LOW (ref 6.5–8.1)

## 2020-08-21 LAB — CBC
HCT: 26.8 % — ABNORMAL LOW (ref 36.0–46.0)
Hemoglobin: 8.2 g/dL — ABNORMAL LOW (ref 12.0–15.0)
MCH: 23.2 pg — ABNORMAL LOW (ref 26.0–34.0)
MCHC: 30.6 g/dL (ref 30.0–36.0)
MCV: 75.7 fL — ABNORMAL LOW (ref 80.0–100.0)
Platelets: 472 10*3/uL — ABNORMAL HIGH (ref 150–400)
RBC: 3.54 MIL/uL — ABNORMAL LOW (ref 3.87–5.11)
RDW: 23.6 % — ABNORMAL HIGH (ref 11.5–15.5)
WBC: 17.2 10*3/uL — ABNORMAL HIGH (ref 4.0–10.5)
nRBC: 0.3 % — ABNORMAL HIGH (ref 0.0–0.2)

## 2020-08-21 LAB — GLUCOSE, CAPILLARY
Glucose-Capillary: 98 mg/dL (ref 70–99)
Glucose-Capillary: 78 mg/dL (ref 70–99)
Glucose-Capillary: 106 mg/dL — ABNORMAL HIGH (ref 70–99)
Glucose-Capillary: 88 mg/dL (ref 70–99)
Glucose-Capillary: 90 mg/dL (ref 70–99)

## 2020-08-21 MED ORDER — METOPROLOL TARTRATE 50 MG PO TABS
50.0000 mg | ORAL_TABLET | Freq: Two times a day (BID) | ORAL | Status: DC
  Administered 2020-08-21 – 2020-08-23 (×6): 50 mg via ORAL
  Filled 2020-08-21 (×6): qty 1

## 2020-08-21 MED ORDER — POTASSIUM CHLORIDE CRYS ER 20 MEQ PO TBCR
40.0000 meq | EXTENDED_RELEASE_TABLET | Freq: Once | ORAL | Status: AC
  Administered 2020-08-21: 40 meq via ORAL
  Filled 2020-08-21: qty 2

## 2020-08-21 NOTE — Progress Notes (Signed)
Physical Therapy Treatment Patient Details Name: [name] MRN: [id number] DOB: 05-Jul-1946 Today's Date: 08/21/2020    History of Present Illness Pt is a 74 y.o. female admitted 08/12/20 with c/o lethargy, weakness and poor appetite that started after RLE blister burst 1-2 wks prior; pt also with recent fall. Workup for sepsis secondary to cellulitis. (-) BLE DVT. PMH includes GERD, HTN, HLD, OA, PE, chronic venous insufficiency.    PT Comments    Patient reports headache. Agrees to get up to recliner only this morning. No further ambulation. She is mod independent with bed mobility. Increased time and use of bed rails. She performed sit to stand with min guard. Able to ambulate 4 feet from bed to recliner with RW and min guard. She requires increased time with all mobility, takes very small, shuffle steps. Patient will continue to benefit from skilled PT while here to improve functional independence and safety with mobility.        Follow Up Recommendations  SNF;Supervision/Assistance - 24 hour     Equipment Recommendations  None recommended by PT    Recommendations for Other Services       Precautions / Restrictions Precautions Precautions: Fall Precaution Comments: edema and blisters RLE Restrictions Weight Bearing Restrictions: No    Mobility  Bed Mobility Overal bed mobility: Modified Independent Bed Mobility: Supine to Sit     Supine to sit: HOB elevated;Modified independent (Device/Increase time)     General bed mobility comments: patient with improved ability this session. No physical assist needed to get to edge of bed.  Transfers Overall transfer level: Needs assistance Equipment used: Rolling walker (2 wheeled) Transfers: Sit to/from [facility] Sit to Stand: Min guard Stand pivot transfers: Min guard       General transfer comment: patient requires cues to reach back to seated surface. Small steps with increased time  required.  Ambulation/Gait Ambulation/Gait assistance: Min guard Gait Distance (Feet): 3 Feet Assistive device: Rolling walker (2 wheeled) Gait Pattern/deviations: Trunk flexed;Decreased step length - right;Decreased step length - left;Shuffle Gait velocity: Decreased   General Gait Details: pt maintains flexed posture with forearms propped on RW, cues for pushing through bil UE to extend trunk. AMbulated from bed to recliner only due to reported headache this morning.   Stairs             Wheelchair Mobility    Modified Rankin (Stroke Patients Only)       Balance Overall balance assessment: Needs assistance Sitting-balance support: Feet supported Sitting balance-Leahy Scale: Good     Standing balance support: Bilateral upper extremity supported;During functional activity Standing balance-Leahy Scale: Fair Standing balance comment: Pt leaning on forearms on RW handles and not able to stand upright                            Cognition Arousal/Alertness: Awake/alert Behavior During Therapy: WFL for tasks assessed/performed Overall Cognitive Status: Within Functional Limits for tasks assessed                                        Exercises Other Exercises Other Exercises: B LE exercises: ap, laq, marching, hip abd/add x 10 reps each    General Comments        Pertinent Vitals/Pain Pain Assessment: No/denies pain    Home Living  Prior Function            PT Goals (current goals can now be found in the care plan section) Acute Rehab PT Goals Patient Stated Goal: possibly go to rehab for a week or so PT Goal Formulation: With patient Time For Goal Achievement: 08/28/20 Potential to Achieve Goals: Good Progress towards PT goals: Progressing toward goals    Frequency    Min 3X/week      PT Plan Current plan remains appropriate    Co-evaluation              AM-PAC PT "6 Clicks"  Mobility   Outcome Measure  Help needed turning from your back to your side while in a flat bed without using bedrails?: A Little Help needed moving from lying on your back to sitting on the side of a flat bed without using bedrails?: A Little Help needed moving to and from a bed to a chair (including a wheelchair)?: A Little Help needed standing up from a chair using your arms (e.g., wheelchair or bedside chair)?: A Little Help needed to walk in hospital room?: A Lot Help needed climbing 3-5 steps with a railing? : Total 6 Click Score: 15    End of Session Equipment Utilized During Treatment: Gait belt Activity Tolerance: Patient tolerated treatment well Patient left: in chair;with call bell/phone within reach;with chair alarm set Nurse Communication: Mobility status PT Visit Diagnosis: Other abnormalities of gait and mobility (R26.89);Muscle weakness (generalized) (M62.81);Difficulty in walking, not elsewhere classified (R26.2)     Time: 1840-3754 PT Time Calculation (min) (ACUTE ONLY): 16 min  Charges:  $Therapeutic Exercise: 8-22 mins                     Thorsten Climer, PT, GCS 08/21/20,10:09 AM

## 2020-08-21 NOTE — NC FL2 (Addendum)
[place] MEDICAID FL2 LEVEL OF CARE SCREENING TOOL     IDENTIFICATION  Patient Name: [name] Birthdate: Dec 01, 1945 Sex: female Admission Date (Current Location): 08/12/2020  [facility] and [place] Number:  Herbalist and Address:  [place]. [facility], [place] [address] [place], [place] [address]      Provider Number: [id number]  Attending Physician Name and Address:  Antonieta Pert, MD  Relative Name and Phone Number:  Lenell Antu [phone]    Current Level of Care: Hospital Recommended Level of Care: [place] Prior Approval Number:    Date Approved/Denied:   PASRR Number:  [id number]  Discharge Plan: SNF    Current Diagnoses: Patient Active Problem List   Diagnosis Date Noted  . Sepsis due to cellulitis ([place]) 08/13/2020  . Cellulitis of right lower extremity 08/13/2020  . Injury of back due to fall 08/13/2020  . Microcytic anemia 08/13/2020  . Lactic acidosis 08/13/2020  . Sepsis ([place]) 08/13/2020  . Aortic atherosclerosis ([place]) 08/07/2020  . Itching 08/07/2020  . Leg skin lesion, left 08/07/2020  . Cellulitis of left leg 01/11/2020  . Pulmonary embolism ([place]) 12/28/2019  . Leg wound, left 11/12/2019  . Insomnia 09/23/2019  . Peripheral edema 07/20/2019  . Primary osteoarthritis of right knee 04/14/2019  . Hyperglycemia 04/13/2018  . Acute bursitis of right shoulder 08/16/2017  . Abnormal CXR 07/26/2017  . Unexplained night sweats 07/26/2017  . Greater trochanteric bursitis of right hip 06/23/2017  . Sweating abnormality 03/09/2017  . Gingivitis 03/09/2017  . Herpes zoster without complication 92/08/9416  . Trapezoid ligament sprain, right, initial encounter 12/11/2016  . Fasciculation 12/11/2016  . Acute bronchitis 11/16/2016  . Degenerative joint disease of left shoulder 10/07/2016  . Degenerative arthritis of knee, bilateral 09/16/2016  . Spinal stenosis, lumbar region, with neurogenic  claudication 06/17/2016  . Compression fracture of L1 lumbar vertebra ([place]) 03/31/2016  . Pelvic mass in female 03/31/2016  . Urinary frequency 02/27/2016  . 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 Carpal tunnel syndrome 09/18/2014  . Sprain of ankle, unspecified site 07/04/2014  . Primary localized osteoarthrosis, lower leg 05/21/2014  . Left knee pain 03/15/2014  . Food allergy 10/05/2013  . Eustachian tube dysfunction, left 04/24/2013  . Degenerative arthritis of left knee 04/24/2013  . Hot flashes 04/24/2013  . External otitis of left ear 12/15/2012  . Right lumbar radiculitis 04/25/2012  . Seasonal and perennial allergic rhinitis 03/18/2012  . Hearing loss in right ear 03/18/2012  . Esophageal dysphagia 02/11/2012  . Dysphagia 01/20/2012  . Rash 01/20/2012  . Bilateral knee pain 01/20/2012  . Encounter for well adult exam with abnormal findings 01/14/2012  . GERD (gastroesophageal reflux disease) 01/14/2012  . Essential hypertension 01/14/2012  . Mixed hyperlipidemia 01/14/2012  . Obesity 01/14/2012  . Osteoarthritis 01/14/2012  . DJD (degenerative joint disease), lumbar 01/14/2012  . Palpitations 11/21/2010  . Dyspnea on exertion 11/21/2010    Orientation RESPIRATION BLADDER Height & Weight     Self, Time, Situation, Place  Normal Continent, External catheter Weight: 98.4 kg Height:  5\' 3"  (160 cm)  BEHAVIORAL SYMPTOMS/MOOD NEUROLOGICAL BOWEL NUTRITION STATUS   (n/a)  (n/a) Continent Diet (Heart Healthy)  AMBULATORY STATUS COMMUNICATION OF NEEDS Skin   Limited Assist Verbally Other (Comment) (blister with cellilitis noted to anterior right leg)                       Personal Care Assistance Level of Assistance  Bathing,  Feeding, Dressing, Total care Bathing Assistance: Limited assistance Feeding assistance: Independent (Assist with set up) Dressing Assistance: Maximum assistance Total Care Assistance: Limited assistance   Functional Limitations Info   Sight, Hearing, Speech Sight Info: Impaired Hearing Info: Adequate Speech Info: Adequate    SPECIAL CARE FACTORS FREQUENCY  PT (By licensed PT), OT (By licensed OT)     PT Frequency: 5X OT Frequency: 5X            Contractures Contractures Info: Not present    Additional Factors Info  Code Status, Allergies, Psychotropic, Insulin Sliding Scale, Isolation Precautions, Suctioning Needs Code Status Info: DNR Allergies Info: Soybean-containing Drug Products, Sulfonamide Derivatives Psychotropic Info: n/a Insulin Sliding Scale Info: see d/c summary for sliding scale info Isolation Precautions Info: none Suctioning Needs: n/a   Current Medications (08/21/2020):  This is the current hospital active medication list Current Facility-Administered Medications  Medication Dose Route Frequency Provider Last Rate Last Admin  . acetaminophen (TYLENOL) tablet 650 mg  650 mg Oral Q6H PRN Vernelle Emerald, MD   650 mg at 08/21/20 0916   Or  . acetaminophen (TYLENOL) suppository 650 mg  650 mg Rectal Q6H PRN Vernelle Emerald, MD      . alum & mag hydroxide-simeth (MAALOX/MYLANTA) 200-200-20 MG/5ML suspension 30 mL  30 mL Oral Q6H PRN Emeterio Reeve, DO   30 mL at 08/19/20 1223  . atorvastatin (LIPITOR) tablet 20 mg  20 mg Oral Daily Shalhoub, Sherryll Burger, MD   20 mg at 08/21/20 0912  . azelastine (ASTELIN) 0.1 % nasal spray 1 spray  1 spray Each Nare Daily PRN Shalhoub, Sherryll Burger, MD   1 spray at 08/19/20 2221  . ceFAZolin (ANCEF) IVPB 2g/100 mL premix  2 g Intravenous Q8H Thayer Headings, MD 200 mL/hr at 08/21/20 0913 2 g at 08/21/20 0913  . DULoxetine (CYMBALTA) DR capsule 40 mg  40 mg Oral Daily Shalhoub, Sherryll Burger, MD   40 mg at 08/21/20 0913  . ferrous sulfate tablet 325 mg  325 mg Oral BID WC Lorella Nimrod, MD   325 mg at 08/21/20 0914  . furosemide (LASIX) tablet 80 mg  80 mg Oral BID Antonieta Pert, MD   80 mg at 08/21/20 0914  . gabapentin (NEURONTIN) capsule 200 mg  200 mg Oral QHS  Shalhoub, Sherryll Burger, MD   200 mg at 08/20/20 2009  . guaiFENesin (ROBITUSSIN) 100 MG/5ML solution 100 mg  5 mL Oral Q4H PRN Emeterio Reeve, DO   100 mg at 08/16/20 1006  . hydrOXYzine (ATARAX/VISTARIL) tablet 10 mg  10 mg Oral Q8H PRN Vernelle Emerald, MD   10 mg at 08/20/20 0851  . insulin aspart (novoLOG) injection 0-15 Units  0-15 Units Subcutaneous TID WC Lorella Nimrod, MD   2 Units at 08/19/20 1645  . insulin aspart (novoLOG) injection 0-5 Units  0-5 Units Subcutaneous QHS Amin, Soundra Pilon, MD      . ipratropium-albuterol (DUONEB) 0.5-2.5 (3) MG/3ML nebulizer solution 3 mL  3 mL Nebulization Q2H PRN Emeterio Reeve, DO   3 mL at 08/16/20 1808  . loratadine (CLARITIN) tablet 10 mg  10 mg Oral Daily PRN Shalhoub, Sherryll Burger, MD      . metoprolol tartrate (LOPRESSOR) tablet 50 mg  50 mg Oral BID Antonieta Pert, MD   50 mg at 08/21/20 0913  . ondansetron (ZOFRAN) tablet 4 mg  4 mg Oral Q6H PRN Shalhoub, Sherryll Burger, MD       Or  . ondansetron (  ZOFRAN) injection 4 mg  4 mg Intravenous Q6H PRN Shalhoub, Sherryll Burger, MD      . pantoprazole (PROTONIX) EC tablet 40 mg  40 mg Oral Daily Lorella Nimrod, MD   40 mg at 08/21/20 0913  . polyethylene glycol (MIRALAX / GLYCOLAX) packet 17 g  17 g Oral Daily PRN Shalhoub, Sherryll Burger, MD      . polyvinyl alcohol (LIQUIFILM TEARS) 1.4 % ophthalmic solution   Both Eyes TID PRN Shalhoub, Sherryll Burger, MD      . tiZANidine (ZANAFLEX) tablet 2 mg  2 mg Oral Q6H PRN Shalhoub, Sherryll Burger, MD   2 mg at 08/16/20 0836  . traMADol (ULTRAM) tablet 50-100 mg  50-100 mg Oral Q6H PRN Vernelle Emerald, MD   100 mg at 08/21/20 0620  . vitamin B-12 (CYANOCOBALAMIN) tablet 1,000 mcg  1,000 mcg Oral Daily Lorella Nimrod, MD   1,000 mcg at 08/21/20 0383     Discharge Medications: Please see discharge summary for a list of discharge medications.  Relevant Imaging Results:  Relevant Lab Results:   Additional Information SS# [id number]  Angelita Ingles, RN

## 2020-08-21 NOTE — Progress Notes (Signed)
PROGRESS NOTE    [name]  EQA:[id number] DOB: October 06, 1946 DOA: 08/12/2020 PCP: Biagio Borg, MD   Chief Complaint  Patient presents with  . Headache  . Dizziness   Brief Narrative: 74 year old female with GERD, HTN, HLD, osteoarthritis, chronic venous insufficiency, PE diagnosed in March 2021 was on Xarelto completed on 07/2020 presented to [name] ED with headache, lightheadedness and 1 to weeks history of blister on the anterior surface of her right leg that had burst and was having drainage in the area along with redness and swelling in the surrounding tissue. In the ED she was found to meet criteria for severe sepsis with fever, tachycardia, tachypnea, lactic acidosis along with right lower extremity cellulitis.  Placed on broad-spectrum antibiotics and was admitted. Patient is on vancomycin after adding cefepime started to respond.  Seen by infectious disease-antibiotic and escalated to Ancef.  Subjective:  WBC remains up 17.2K, afebrile.  No acute events. Patient reports her right leg pain and swelling getting better.  Assessment & Plan:  Severe Sepsis due to cellulitis on RLE: POA, met severe sepsis with significant leukocytosis 34k, tachycardia, lactic acidosis 2.5, fever of >100.4.  Currently afebrile, WBC downtrending.  Blood culture no growth so far.  Cefepime/vancomycin.  Right lower extremity redness improving but he still has area with blisters and swelling on the right medial thigh. Wound culture positive for staph epidermidis sensitive vancomycin, positive for  corynebacterium. slow to improve, right medial thigh with bullae.  CT on 10/30 showed subcutaneous edema within the right thigh and foreleg,no discrete drainable fluid collection identified.  Appreciate ID consult 11/1, changed to cefazolin to cover for streptococcal infection which is the typical infection for skin. Continue antibiotics through 11/8.  Afebrile but still with significant leukocytosis.   Once medically stable can transition patient to oral Keflex.  Continue on wound care.  Bullous skin lesion on the leg predisposing to cellulitis.  Also complaining of itching present prior to admission. Patient has bullous skin lesion will need dermatology follow-up and was planned prior to admission.   Chronic diastolic CHF patient on Lasix at home, AKI has resolved so resumed her high dose Lasix  80 mg bid 11/2, given in the setting of leg edema, UOP is 1800 ml.Monitor renal function, intake output and daily weight.  Hypokalemia add oral Lasix.  Essential hypertension: had episode of hypotension 08/17/20-blood pressure is stable 130s to 160s.  Cont home Lasix.  Resume metoprolol.    Severe anemia, microcytic as low as 6.8 g.  Received 2 units PRBC.  Hb has been stable in 8 g range.  No black or tarry stool, FOBT  Was negative 08/13/20.Previously lower GI bleed in 2016 and had endoscopy/capsule endoscopy and colonoscopy positive for diverticulosis and hemorrhoids pathology negative for malignancy.  Will need outpatient GI evaluation.  Monitor hemoglobin currently holding in 7 to 8 g range. Recent Labs  Lab 08/17/20 1525 08/18/20 0600 08/19/20 1112 08/20/20 0439 08/21/20 0203  HGB 8.6* 7.7* 8.3* 7.9* 8.2*  HCT 28.4* 26.3* 28.8* 97.9* 89.2*   Metabolic acidosis resolved.  Off IV fluids.    AKI creatinine peaked to 1.3 has improved to 0.6 and is off ivf Mixed hyperlipidemia: Continue statin. Back pain with recent fall at home imaging negative for any acute fracture continue PT OT as tolerated. GERD on PPI. Morbid obesity with BMI 38.4: will benefit with weight loss and healthy lifestyle.  Advise outpatient follow-up with her primary care doctor.  Nutrition: Diet Order  Diet Heart Room service appropriate? Yes; Fluid consistency: Thin  Diet effective now                Body mass index is 38.43 kg/m.  DVT prophylaxis: Place and maintain sequential compression device  Start: 08/13/20 0244 Code Status:   Code Status: DNR  Family Communication: plan of care discussed with patient at bedside.  Status is: Inpatient  Remains inpatient appropriate because:Inpatient level of care appropriate due to severity of illness and For ongoing management of right lower extremity wound   Dispo: The patient is from: Home by herself              Anticipated d/c is to: SNF, she has declined SNF. If so will need [facility]              Anticipated d/c date is: 1-2 days              Patient currently is not medically stable to d/c. Consultants:see note  Procedures:see note  Culture/Microbiology    Component Value Date/Time   SDES WOUND RIGHT LOWER LEG 08/13/2020 0243   SPECREQUEST NONE 08/13/2020 0243   CULT  08/13/2020 0243    MODERATE CORYNEBACTERIUM PSEUDODIPHTHERIAE Standardized susceptibility testing for this organism is not available. RARE STAPHYLOCOCCUS EPIDERMIDIS    REPTSTATUS 08/16/2020 FINAL 08/13/2020 0243    Other culture-see note  Medications: Scheduled Meds: . atorvastatin  20 mg Oral Daily  . DULoxetine  40 mg Oral Daily  . ferrous sulfate  325 mg Oral BID WC  . furosemide  80 mg Oral BID  . gabapentin  200 mg Oral QHS  . insulin aspart  0-15 Units Subcutaneous TID WC  . insulin aspart  0-5 Units Subcutaneous QHS  . pantoprazole  40 mg Oral Daily  . potassium chloride  40 mEq Oral Once  . vitamin B-12  1,000 mcg Oral Daily   Continuous Infusions: .  ceFAZolin (ANCEF) IV 2 g (08/21/20 0115)    Antimicrobials: Anti-infectives (From admission, onward)   Start     Dose/Rate Route Frequency Ordered Stop   08/19/20 1630  ceFAZolin (ANCEF) IVPB 2g/100 mL premix        2 g 200 mL/hr over 30 Minutes Intravenous Every 8 hours 08/19/20 1520     08/18/20 0500  vancomycin (VANCOREADY) IVPB 1250 mg/250 mL  Status:  Discontinued        1,250 mg 166.7 mL/hr over 90 Minutes Intravenous Every 24 hours 08/17/20 2242 08/19/20 1520   08/16/20 2200  ceFEPIme  (MAXIPIME) 2 g in sodium chloride 0.9 % 100 mL IVPB  Status:  Discontinued        2 g 200 mL/hr over 30 Minutes Intravenous Every 12 hours 08/16/20 1822 08/19/20 1520   08/15/20 1000  ceFEPIme (MAXIPIME) 2 g in sodium chloride 0.9 % 100 mL IVPB  Status:  Discontinued        2 g 200 mL/hr over 30 Minutes Intravenous Every 12 hours 08/15/20 0834 08/16/20 1558   08/13/20 2000  vancomycin (VANCOREADY) IVPB 750 mg/150 mL  Status:  Discontinued        750 mg 150 mL/hr over 60 Minutes Intravenous Every 12 hours 08/13/20 0336 08/17/20 2242   08/13/20 0245  vancomycin (VANCOCIN) IVPB 1000 mg/200 mL premix        1,000 mg 200 mL/hr over 60 Minutes Intravenous  Once 08/13/20 0242 08/13/20 0609   08/12/20 2030  cefTRIAXone (ROCEPHIN) 2 g in sodium chloride 0.9 % 100  mL IVPB  Status:  Discontinued        2 g 200 mL/hr over 30 Minutes Intravenous Every 24 hours 08/12/20 2016 08/13/20 0242     Objective: Vitals: Today's Vitals   08/20/20 1704 08/20/20 2100 08/20/20 2159 08/21/20 0553  BP: 134/72  (!) 155/86 (!) 148/82  Pulse: (!) 105  (!) 103 (!) 105  Resp: 15     Temp: 99.5 F (37.5 C)  98.9 F (37.2 C) 99.6 F (37.6 C)  TempSrc:      SpO2: 100%  99% 92%  Weight:      Height:      PainSc:  0-No pain      Intake/Output Summary (Last 24 hours) at 08/21/2020 0806 Last data filed at 08/20/2020 1716 Gross per 24 hour  Intake --  Output 1800 ml  Net -1800 ml   Filed Weights   08/12/20 2156  Weight: 98.4 kg   Weight change:   Intake/Output from previous day: 11/02 0701 - 11/03 0700 In: -  Out: 1800 [Urine:1800] Intake/Output this shift: No intake/output data recorded.  Examination: General exam: AAOX3 , NAD, weak appearing. HEENT:Oral mucosa moist, Ear/Nose WNL grossly, dentition normal. Respiratory system: bilaterally clear,no wheezing or crackles,no use of accessory muscle Cardiovascular system: S1 & S2 +, No JVD,. Gastrointestinal system: Abdomen soft, NT,ND, BS+ Nervous  System:Alert, awake, moving extremities and grossly nonfocal Extremities: Bilateral edematous leg with right lower extremity erythema improving, medial area bullous lesions improving No edema, distal peripheral pulses palpable.  Skin: No rashes,no icterus. MSK: Normal muscle bulk,tone, power.    Data Reviewed: I have personally reviewed following labs and imaging studies CBC: Recent Labs  Lab 08/17/20 1525 08/18/20 0600 08/19/20 1112 08/20/20 0439 08/21/20 0203  WBC 27.6* 21.3* 20.1* 16.3* 17.2*  HGB 8.6* 7.7* 8.3* 7.9* 8.2*  HCT 28.4* 26.3* 28.8* 26.2* 26.8*  MCV 74.9* 76.9* 77.8* 77.3* 75.7*  PLT 403* 365 444* 413* 831*   Basic Metabolic Panel: Recent Labs  Lab 08/16/20 0211 08/17/20 1525 08/19/20 1112 08/20/20 0439 08/21/20 0203  NA 135 137 140 140 140  K 4.8 4.0 4.1 3.9 3.4*  CL 103 107 112* 112* 104  CO2 19* 21* 19* 18* 25  GLUCOSE 129* 128* 100* 94 101*  BUN 23 33* 10 8 6*  CREATININE 1.35* 1.35* 0.68 0.66 0.82  CALCIUM 8.6* 8.8* 8.9 8.9 9.0   GFR: Estimated Creatinine Clearance: 68.3 mL/min (by C-G formula based on SCr of 0.82 mg/dL). Liver Function Tests: Recent Labs  Lab 08/17/20 1525 08/20/20 0439 08/21/20 0203  AST 14* 13* 15  ALT 16 13 12   ALKPHOS 95 88 83  BILITOT 0.5 0.7 0.5  PROT 5.7* 5.2* 5.3*  ALBUMIN 2.2* 2.0* 2.1*   No results for input(s): LIPASE, AMYLASE in the last 168 hours. No results for input(s): AMMONIA in the last 168 hours. Coagulation Profile: No results for input(s): INR, PROTIME in the last 168 hours. Cardiac Enzymes: Recent Labs  Lab 08/20/20 0439  CKTOTAL 28*   BNP (last 3 results) Recent Labs    12/11/19 1105  PROBNP 26.0   HbA1C: No results for input(s): HGBA1C in the last 72 hours. CBG: Recent Labs  Lab 08/20/20 1705 08/20/20 1935 08/20/20 2321 08/21/20 0350 08/21/20 0743  GLUCAP 94 107* 96 98 78   Lipid Profile: No results for input(s): CHOL, HDL, LDLCALC, TRIG, CHOLHDL, LDLDIRECT in the last 72  hours. Thyroid Function Tests: No results for input(s): TSH, T4TOTAL, FREET4, T3FREE, THYROIDAB in the  last 72 hours. Anemia Panel: No results for input(s): VITAMINB12, FOLATE, FERRITIN, TIBC, IRON, RETICCTPCT in the last 72 hours. Sepsis Labs: Recent Labs  Lab 08/17/20 1525  LATICACIDVEN 1.5    Recent Results (from the past 240 hour(s))  Respiratory Panel by RT PCR (Flu A&B, Covid) - Nasopharyngeal Swab     Status: None   Collection Time: 08/12/20  8:16 PM   Specimen: Nasopharyngeal Swab  Result Value Ref Range Status   SARS Coronavirus 2 by RT PCR NEGATIVE NEGATIVE Final    Comment: (NOTE) SARS-CoV-2 target nucleic acids are NOT DETECTED.  The SARS-CoV-2 RNA is generally detectable in upper respiratoy specimens during the acute phase of infection. The lowest concentration of SARS-CoV-2 viral copies this assay can detect is 131 copies/mL. A negative result does not preclude SARS-Cov-2 infection and should not be used as the sole basis for treatment or other patient management decisions. A negative result may occur with  improper specimen collection/handling, submission of specimen other than nasopharyngeal swab, presence of viral mutation(s) within the areas targeted by this assay, and inadequate number of viral copies (<131 copies/mL). A negative result must be combined with clinical observations, patient history, and epidemiological information. The expected result is Negative.  Fact Sheet for Patients:  PinkCheek.be  Fact Sheet for Healthcare Providers:  GravelBags.it  This test is no t yet approved or cleared by the [place] FDA and  has been authorized for detection and/or diagnosis of SARS-CoV-2 by FDA under an Emergency Use Authorization (EUA). This EUA will remain  in effect (meaning this test can be used) for the duration of the COVID-19 declaration under Section 564(b)(1) of the Act, 21  U.S.C. section 360bbb-3(b)(1), unless the authorization is terminated or revoked sooner.     Influenza A by PCR NEGATIVE NEGATIVE Final   Influenza B by PCR NEGATIVE NEGATIVE Final    Comment: (NOTE) The Xpert Xpress SARS-CoV-2/FLU/RSV assay is intended as an aid in  the diagnosis of influenza from Nasopharyngeal swab specimens and  should not be used as a sole basis for treatment. Nasal washings and  aspirates are unacceptable for Xpert Xpress SARS-CoV-2/FLU/RSV  testing.  Fact Sheet for Patients: PinkCheek.be  Fact Sheet for Healthcare Providers: GravelBags.it  This test is not yet approved or cleared by the [place] FDA and  has been authorized for detection and/or diagnosis of SARS-CoV-2 by  FDA under an Emergency Use Authorization (EUA). This EUA will remain  in effect (meaning this test can be used) for the duration of the  Covid-19 declaration under Section 564(b)(1) of the Act, 21  U.S.C. section 360bbb-3(b)(1), unless the authorization is  terminated or revoked. Performed at [facility], [place] [address] [place], [place] [address]   Blood Culture (routine x 2)     Status: None   Collection Time: 08/12/20  8:18 PM   Specimen: BLOOD  Result Value Ref Range Status   Specimen Description BLOOD LEFT ANTECUBITAL  Final   Special Requests   Final    BOTTLES DRAWN AEROBIC AND ANAEROBIC Blood Culture adequate volume   Culture   Final    NO GROWTH 5 DAYS Performed at [facility], [place] [address] [place], [place] [address]    Report Status 08/17/2020 FINAL  Final  Blood Culture (routine x 2)     Status: None   Collection Time: 08/12/20  8:20 PM   Specimen: BLOOD  Result Value Ref Range Status   Specimen Description BLOOD RIGHT ANTECUBITAL  Final   Special Requests   Final    BOTTLES DRAWN AEROBIC AND ANAEROBIC Blood Culture results may not be optimal due to an excessive volume of blood received in  culture bottles   Culture   Final    NO GROWTH 5 DAYS Performed at [facility], [place] [address], [place], [place] [address]    Report Status 08/17/2020 FINAL  Final  Urine culture     Status: Abnormal   Collection Time: 08/13/20 12:11 AM   Specimen: In/Out Cath Urine  Result Value Ref Range Status   Specimen Description IN/OUT CATH URINE  Final   Special Requests NONE  Final   Culture (A)  Final    <10,000 COLONIES/mL INSIGNIFICANT GROWTH Performed at [place] [address] [place], [place] [address]    Report Status 08/14/2020 FINAL  Final  Aerobic Culture (superficial specimen)     Status: None   Collection Time: 08/13/20  2:43 AM   Specimen: Wound  Result Value Ref Range Status   Specimen Description WOUND RIGHT LOWER LEG  Final   Special Requests NONE  Final   Gram Stain   Final    RARE WBC PRESENT, PREDOMINANTLY PMN NO ORGANISMS SEEN Performed at [facility], [place] [address], [place],  [address]    Culture   Final    MODERATE CORYNEBACTERIUM PSEUDODIPHTHERIAE Standardized susceptibility testing for this organism is not available. RARE STAPHYLOCOCCUS EPIDERMIDIS    Report Status 08/16/2020 FINAL  Final   Organism ID, Bacteria STAPHYLOCOCCUS EPIDERMIDIS  Final      Susceptibility   Staphylococcus epidermidis - MIC*    CIPROFLOXACIN >=8 RESISTANT Resistant     ERYTHROMYCIN >=8 RESISTANT Resistant     GENTAMICIN <=0.5 SENSITIVE Sensitive     OXACILLIN >=4 RESISTANT Resistant     TETRACYCLINE >=16 RESISTANT Resistant     VANCOMYCIN 1 SENSITIVE Sensitive     TRIMETH/SULFA <=10 SENSITIVE Sensitive     CLINDAMYCIN <=0.25 SENSITIVE Sensitive     RIFAMPIN <=0.5 SENSITIVE Sensitive     Inducible Clindamycin NEGATIVE Sensitive     * RARE STAPHYLOCOCCUS EPIDERMIDIS     Radiology Studies: No results found.   LOS: 8 days   Antonieta Pert, MD [facility]  08/21/2020, 8:06 AM

## 2020-08-22 DIAGNOSIS — L0390 Cellulitis, unspecified: Secondary | ICD-10-CM | POA: Diagnosis not present

## 2020-08-22 DIAGNOSIS — A419 Sepsis, unspecified organism: Secondary | ICD-10-CM | POA: Diagnosis not present

## 2020-08-22 LAB — GLUCOSE, CAPILLARY
Glucose-Capillary: 86 mg/dL (ref 70–99)
Glucose-Capillary: 93 mg/dL (ref 70–99)
Glucose-Capillary: 115 mg/dL — ABNORMAL HIGH (ref 70–99)
Glucose-Capillary: 117 mg/dL — ABNORMAL HIGH (ref 70–99)

## 2020-08-22 LAB — CBC
HCT: 28.7 % — ABNORMAL LOW (ref 36.0–46.0)
Hemoglobin: 8.6 g/dL — ABNORMAL LOW (ref 12.0–15.0)
MCH: 22.9 pg — ABNORMAL LOW (ref 26.0–34.0)
MCHC: 30 g/dL (ref 30.0–36.0)
MCV: 76.5 fL — ABNORMAL LOW (ref 80.0–100.0)
Platelets: 419 10*3/uL — ABNORMAL HIGH (ref 150–400)
RBC: 3.75 MIL/uL — ABNORMAL LOW (ref 3.87–5.11)
RDW: 24 % — ABNORMAL HIGH (ref 11.5–15.5)
WBC: 18.2 10*3/uL — ABNORMAL HIGH (ref 4.0–10.5)
nRBC: 0.4 % — ABNORMAL HIGH (ref 0.0–0.2)

## 2020-08-22 LAB — COMPREHENSIVE METABOLIC PANEL
ALT: 14 U/L (ref 0–44)
AST: 24 U/L (ref 15–41)
Albumin: 2.4 g/dL — ABNORMAL LOW (ref 3.5–5.0)
Alkaline Phosphatase: 87 U/L (ref 38–126)
Anion gap: 13 (ref 5–15)
BUN: 9 mg/dL (ref 8–23)
CO2: 25 mmol/L (ref 22–32)
Calcium: 8.6 mg/dL — ABNORMAL LOW (ref 8.9–10.3)
Chloride: 102 mmol/L (ref 98–111)
Creatinine, Ser: 0.87 mg/dL (ref 0.44–1.00)
GFR, Estimated: >60 mL/min (ref >60)
Glucose, Bld: 93 mg/dL (ref 70–99)
Potassium: 3.8 mmol/L (ref 3.5–5.1)
Sodium: 140 mmol/L (ref 135–145)
Total Bilirubin: 0.5 mg/dL (ref 0.3–1.2)
Total Protein: 5.8 g/dL — ABNORMAL LOW (ref 6.5–8.1)

## 2020-08-22 MED ORDER — HYDROCORTISONE 1 % EX CREA
TOPICAL_CREAM | Freq: Once | CUTANEOUS | Status: AC
  Filled 2020-08-22: qty 28

## 2020-08-22 MED ORDER — HYDROCORTISONE 0.5 % EX CREA
TOPICAL_CREAM | Freq: Once | CUTANEOUS | Status: DC
  Filled 2020-08-22: qty 28.35

## 2020-08-22 NOTE — TOC Initial Note (Signed)
Transition of Care [facility]) - Initial/Assessment Note    Patient Details  Name: [name] MRN: [id number] Date of Birth: 1946/07/26  [facility] CM/SW Contact:    Angelita Ingles, RN Phone Number: [phone]  08/22/2020, 10:24 AM  Clinical Narrative:                 [facility] consulted for SNF placement for short term rehab. CM at bedside to speak with patient. Patient is agreeable to short term rehab. List of choices provided to the patient. The patient is agreeable to having info faxed out to facilities to obtain bed offers. FL2 completed PASRR # obtained and patient's info has been faxed out via the hub. Insurance authorization has been initiated. TOC will continue to follow for SNF placement.   Expected Discharge Plan: Skilled Nursing Facility Barriers to Discharge: Continued Medical Work up   Patient Goals and CMS Choice Patient states their goals for this hospitalization and ongoing recovery are:: Wants to gain strength so that she can go home. CMS Medicare.gov Compare Post Acute Care list provided to:: Patient Choice offered to / list presented to : Patient  Expected Discharge Plan and Services Expected Discharge Plan: [place] In-house Referral: NA Discharge Planning Services: CM Consult Post Acute Care Choice: [place] Living arrangements for the past 2 months: Single Family Home                 DME Arranged: N/A DME Agency: NA       HH Arranged: NA HH Agency: NA        Prior Living Arrangements/Services Living arrangements for the past 2 months: Single Family Home Lives with:: Self Patient language and need for interpreter reviewed:: Yes Do you feel safe going back to the place where you live?: Yes      Need for Family Participation in Patient Care: Yes (Comment) Care giver support system in place?: Yes (comment)   Criminal Activity/Legal Involvement Pertinent to Current Situation/Hospitalization: No - Comment  as needed  Activities of Daily Living   ADL Screening (condition at time of admission) Patient's cognitive ability adequate to safely complete daily activities?: Yes Is the patient deaf or have difficulty hearing?: No Does the patient have difficulty seeing, even when wearing glasses/contacts?: No Does the patient have difficulty concentrating, remembering, or making decisions?: No Patient able to express need for assistance with ADLs?: Yes Does the patient have difficulty dressing or bathing?: No Independently performs ADLs?: Yes (appropriate for developmental age) Does the patient have difficulty walking or climbing stairs?: No Weakness of Legs: Right Weakness of Arms/Hands: None  Permission Sought/Granted Permission sought to share information with : Family Supports Permission granted to share information with : No              Emotional Assessment Appearance:: Appears stated age Attitude/Demeanor/Rapport: Gracious Affect (typically observed): Accepting, Pleasant Orientation: : Oriented to Self, Oriented to Place, Oriented to  Time, Oriented to Situation Alcohol / Substance Use: Not Applicable Psych Involvement: No (comment)  Admission diagnosis:  Back pain [M54.9] Fall [W19.XXXA] Cellulitis of right leg [S50.539] Sepsis ([place]) [A41.9] Headache [R51.9] Sepsis due to cellulitis ([place]) [L03.90, A41.9] Anemia, unspecified type [D64.9] Sepsis, due to unspecified organism, unspecified whether acute organ dysfunction present [facility]) [A41.9] Patient Active Problem List   Diagnosis Date Noted  . Sepsis due to cellulitis ([place]) 08/13/2020  . Cellulitis of right lower extremity 08/13/2020  . Injury of back due to fall 08/13/2020  .  Microcytic anemia 08/13/2020  . Lactic acidosis 08/13/2020  . Sepsis ([place]) 08/13/2020  . Aortic atherosclerosis ([place]) 08/07/2020  . Itching 08/07/2020  . Leg skin lesion, left 08/07/2020  . Cellulitis of left leg 01/11/2020  . Pulmonary embolism  ([place]) 12/28/2019  . Leg wound, left 11/12/2019  . Insomnia 09/23/2019  . Peripheral edema 07/20/2019  . Primary osteoarthritis of right knee 04/14/2019  . Hyperglycemia 04/13/2018  . Acute bursitis of right shoulder 08/16/2017  . Abnormal CXR 07/26/2017  . Unexplained night sweats 07/26/2017  . Greater trochanteric bursitis of right hip 06/23/2017  . Sweating abnormality 03/09/2017  . Gingivitis 03/09/2017  . Herpes zoster without complication 17/40/8144  . Trapezoid ligament sprain, right, initial encounter 12/11/2016  . Fasciculation 12/11/2016  . Acute bronchitis 11/16/2016  . Degenerative joint disease of left shoulder 10/07/2016  . Degenerative arthritis of knee, bilateral 09/16/2016  . Spinal stenosis, lumbar region, with neurogenic claudication 06/17/2016  . Compression fracture of L1 lumbar vertebra ([place]) 03/31/2016  . Pelvic mass in female 03/31/2016  . Urinary frequency 02/27/2016  . 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 Carpal tunnel syndrome 09/18/2014  . Sprain of ankle, unspecified site 07/04/2014  . Primary localized osteoarthrosis, lower leg 05/21/2014  . Left knee pain 03/15/2014  . Food allergy 10/05/2013  . Eustachian tube dysfunction, left 04/24/2013  . Degenerative arthritis of left knee 04/24/2013  . Hot flashes 04/24/2013  . External otitis of left ear 12/15/2012  . Right lumbar radiculitis 04/25/2012  . Seasonal and perennial allergic rhinitis 03/18/2012  . Hearing loss in right ear 03/18/2012  . Esophageal dysphagia 02/11/2012  . Dysphagia 01/20/2012  . Rash 01/20/2012  . Bilateral knee pain 01/20/2012  . Encounter for well adult exam with abnormal findings 01/14/2012  . GERD (gastroesophageal reflux disease) 01/14/2012  . Essential hypertension 01/14/2012  . Mixed hyperlipidemia 01/14/2012  . Obesity 01/14/2012  . Osteoarthritis 01/14/2012  . DJD (degenerative joint disease), lumbar 01/14/2012  . Palpitations 11/21/2010  . Dyspnea on  exertion 11/21/2010   PCP:  Biagio Borg, MD Pharmacy:   [facility] [place], [place] [address] Phone: [phone] Fax: [phone]  [facility] [phone] - [place], [place] - [address] [place] [address] Phone: [phone] Fax: [phone]  [facility], [address], [address] [place], [place] [address] [place] [address] Phone: [phone] Fax: [phone]     Social Determinants of Health (SDOH) Interventions    Readmission Risk Interventions No flowsheet data found.

## 2020-08-22 NOTE — Progress Notes (Signed)
Physical Therapy Treatment Patient Details Name: [name] MRN: [id number] DOB: 05-09-1946 Today's Date: 08/22/2020    History of Present Illness Pt is a 74 y.o. female admitted 08/12/20 with c/o lethargy, weakness and poor appetite that started after RLE blister burst 1-2 wks prior; pt also with recent fall. Workup for sepsis secondary to cellulitis. (-) BLE DVT. PMH includes GERD, HTN, HLD, OA, PE, chronic venous insufficiency.    PT Comments    Patient received in bed, just had bath. She is agreeable to PT session. She reports she did not sleep very well last night. Leg is feeling better. She is mod independent with supine to sit, min assist for sit to supine. Transfers with supervision, and ambulated 12 feet with RW and min guard, chair to follow for safety. She ambulates at very slow pace with small, shuffle steps. She reports she feels good after walking and no significant fatigue. Distance is limited by left knee pain and occasional buckling. Patient will continue to benefit from skilled PT to improve functional independence, activity tolerance and safety.        Follow Up Recommendations  SNF;Supervision/Assistance - 24 hour     Equipment Recommendations  3in1 (PT)    Recommendations for Other Services       Precautions / Restrictions Precautions Precautions: Fall Precaution Comments: edema and blisters RLE Restrictions Weight Bearing Restrictions: No    Mobility  Bed Mobility Overal bed mobility: Needs Assistance Bed Mobility: Supine to Sit;Sit to Supine     Supine to sit: Modified independent (Device/Increase time) Sit to supine: Min assist   General bed mobility comments: no assist for supine to sit, min assist for sit to supine to bring legs up onto bed.  Transfers Overall transfer level: Needs assistance Equipment used: Rolling walker (2 wheeled) Transfers: Sit to/from [facility] Sit to Stand: Supervision Stand pivot transfers:  Supervision       General transfer comment: Cues to reach back, small steps with increased time required for mobility  Ambulation/Gait Ambulation/Gait assistance: Min guard Gait Distance (Feet): 12 Feet Assistive device: Rolling walker (2 wheeled) Gait Pattern/deviations: Step-to pattern;Decreased stride length;Decreased step length - right;Decreased step length - left;Shuffle;Trunk flexed Gait velocity: Decreased   General Gait Details: pt maintains flexed posture with forearms propped on RW, cues for pushing through bil UE to extend trunk.Cues to stay close to [facility]   Stairs             Wheelchair Mobility    Modified Rankin (Stroke Patients Only)       Balance Overall balance assessment: Needs assistance Sitting-balance support: Feet supported Sitting balance-Leahy Scale: Good     Standing balance support: Bilateral upper extremity supported;During functional activity   Standing balance comment: Pt leaning on forearms on RW handles and not able to stand upright                            Cognition Arousal/Alertness: Awake/alert Behavior During Therapy: WFL for tasks assessed/performed Overall Cognitive Status: Within Functional Limits for tasks assessed                           Safety/Judgement: Decreased awareness of deficits            Exercises      General Comments        Pertinent Vitals/Pain Pain Assessment: No/denies pain    Home Living  Prior Function            PT Goals (current goals can now be found in the care plan section) Acute Rehab PT Goals Patient Stated Goal: patient reports she wants to go home. Thinks her daughter can stay with her for a week. PT Goal Formulation: With patient Time For Goal Achievement: 08/28/20 Potential to Achieve Goals: Fair Progress towards PT goals: Progressing toward goals    Frequency    Min 3X/week      PT Plan Current plan remains  appropriate    Co-evaluation              AM-PAC PT "6 Clicks" Mobility   Outcome Measure  Help needed turning from your back to your side while in a flat bed without using bedrails?: None Help needed moving from lying on your back to sitting on the side of a flat bed without using bedrails?: A Little Help needed moving to and from a bed to a chair (including a wheelchair)?: A Little Help needed standing up from a chair using your arms (e.g., wheelchair or bedside chair)?: A Little Help needed to walk in hospital room?: A Lot Help needed climbing 3-5 steps with a railing? : Total 6 Click Score: 16    End of Session Equipment Utilized During Treatment: Gait belt Activity Tolerance: Patient tolerated treatment well Patient left: in bed;with bed alarm set;with call bell/phone within reach Nurse Communication: Mobility status PT Visit Diagnosis: Other abnormalities of gait and mobility (R26.89);Muscle weakness (generalized) (M62.81);Difficulty in walking, not elsewhere classified (R26.2)     Time: 4315-4008 PT Time Calculation (min) (ACUTE ONLY): 31 min  Charges:  $Gait Training: 23-37 mins                     [name], PT, GCS 08/22/20,11:36 AM

## 2020-08-22 NOTE — Progress Notes (Signed)
PROGRESS NOTE    [name]  QVZ:[id number] DOB: 1946-03-12 DOA: 08/12/2020 PCP: Biagio Borg, MD   Chief Complaint  Patient presents with  . Headache  . Dizziness   Brief Narrative: 74 year old female with GERD, HTN, HLD, osteoarthritis, chronic venous insufficiency, PE diagnosed in March 2021 was on Xarelto completed on 07/2020 presented to [name] ED with headache, lightheadedness and 1 to weeks history of blister on the anterior surface of her right leg that had burst and was having drainage in the area along with redness and swelling in the surrounding tissue. In the ED she was found to meet criteria for severe sepsis with fever, tachycardia, tachypnea, lactic acidosis along with right lower extremity cellulitis. CT LE on 10/30 showed subcutaneous edema within the right thigh and foreleg,no discrete drainable fluid collection identified. Placed on broad-spectrum antibiotics and was admitted. Patient is on vancomycin after adding cefepime started to respond.  Seen by infectious disease-antibiotic and escalated to Ancef.  Subjective:  wbc remains of slightly uptrending.  She is afebrile. Patient reports feeling much better on the right lower extremity. I removed the dressing and seems to be improving  Assessment & Plan:  Severe Sepsis due to cellulitis on RLE: POA, met severe sepsis with significant leukocytosis 34k, tachycardia, lactic acidosis 2.5, fever of >100.4.  Sepsis parameters resolving, antibiotics with Ancef as per infectious disease.  Superficial culture grew staph epidermidis, oxacillin resistant unclear significance given that She is still having persistent leukocytosis, wonder if adding clindamycin would be beneficial, will discuss with infectious disease.  Typically these are Streptococcus dental infection and plan was to continue Keflex on discharge through 11/8 per ID. RLE overall improving but has medial area bullous lesion, being followed by wound care,  continue daily dressing.    Bullous skin lesion with itching on the Rt leg predisposing to cellulitis.  Was present prior to admission was referred to dermatology by pcp and advised to follow-up  Chronic diastolic CHF: Back on home Lasix to help with lower leg edema.  Renal function is stable.  Monitor intake output Daily weight.   Hypokalemia repleted.  Essential hypertension: BP controlled.  Had episode of hypotension 08/17/20 needing IV fluid bolus.  Continue metoprolol and Lasix.  Severe anemia, microcytic as low as 6.8 g.  Received 2 units PRBC.  Hb has been stable in 8 g range.  No black or tarry stool, FOBT  Was negative 08/13/20.Previously lower GI bleed in 2016 and had endoscopy/capsule endoscopy and colonoscopy positive for diverticulosis and hemorrhoids pathology negative for malignancy.  Will need outpatient GI evaluation.  Hemoglobin improved to 8.6 g.  Recent Labs  Lab 08/18/20 0600 08/19/20 1112 08/20/20 0439 08/21/20 0203 08/22/20 0035  HGB 7.7* 8.3* 7.9* 8.2* 8.6*  HCT 26.3* 28.8* 26.2* 32.9* 51.8*   Metabolic acidosis resolved, off IV fluids.   AKI creatinine peaked to 1.3 has improved to 0.6 and is off ivf Mixed hyperlipidemia: Continue statin. Back pain with recent fall at home imaging negative for any acute fracture continue PT OT as tolerated. GERD on PPI. Morbid obesity with BMI 38.4: will benefit with weight loss and healthy lifestyle.  Advise outpatient follow-up with her primary care doctor.  Nutrition: Diet Order            Diet Heart Room service appropriate? Yes; Fluid consistency: Thin  Diet effective now                Body mass index is 38.43 kg/m.  DVT  prophylaxis: Place and maintain sequential compression device Start: 08/13/20 0244 Code Status:   Code Status: DNR  Family Communication: plan of care discussed with patient at bedside.  Status is: Inpatient  Remains inpatient appropriate because:Inpatient level of care appropriate due to  severity of illness and For ongoing management of right lower extremity wound   Dispo: The patient is from: Home by herself              Anticipated d/c is to: SNF, we discussed in detail and she agreed for a SNF, TOC consulted               Anticipated d/c date is: 1-2 days              Patient currently is not medically stable to d/c. Consultants:see note  Procedures:see note  Culture/Microbiology    Component Value Date/Time   SDES WOUND RIGHT LOWER LEG 08/13/2020 0243   SPECREQUEST NONE 08/13/2020 0243   CULT  08/13/2020 0243    MODERATE CORYNEBACTERIUM PSEUDODIPHTHERIAE Standardized susceptibility testing for this organism is not available. RARE STAPHYLOCOCCUS EPIDERMIDIS    REPTSTATUS 08/16/2020 FINAL 08/13/2020 0243    Other culture-see note  Medications: Scheduled Meds: . atorvastatin  20 mg Oral Daily  . DULoxetine  40 mg Oral Daily  . ferrous sulfate  325 mg Oral BID WC  . furosemide  80 mg Oral BID  . gabapentin  200 mg Oral QHS  . insulin aspart  0-15 Units Subcutaneous TID WC  . insulin aspart  0-5 Units Subcutaneous QHS  . metoprolol tartrate  50 mg Oral BID  . pantoprazole  40 mg Oral Daily  . vitamin B-12  1,000 mcg Oral Daily   Continuous Infusions: .  ceFAZolin (ANCEF) IV 2 g (08/21/20 2252)    Antimicrobials: Anti-infectives (From admission, onward)   Start     Dose/Rate Route Frequency Ordered Stop   08/19/20 1630  ceFAZolin (ANCEF) IVPB 2g/100 mL premix        2 g 200 mL/hr over 30 Minutes Intravenous Every 8 hours 08/19/20 1520     08/18/20 0500  vancomycin (VANCOREADY) IVPB 1250 mg/250 mL  Status:  Discontinued        1,250 mg 166.7 mL/hr over 90 Minutes Intravenous Every 24 hours 08/17/20 2242 08/19/20 1520   08/16/20 2200  ceFEPIme (MAXIPIME) 2 g in sodium chloride 0.9 % 100 mL IVPB  Status:  Discontinued        2 g 200 mL/hr over 30 Minutes Intravenous Every 12 hours 08/16/20 1822 08/19/20 1520   08/15/20 1000  ceFEPIme (MAXIPIME) 2 g in  sodium chloride 0.9 % 100 mL IVPB  Status:  Discontinued        2 g 200 mL/hr over 30 Minutes Intravenous Every 12 hours 08/15/20 0834 08/16/20 1558   08/13/20 2000  vancomycin (VANCOREADY) IVPB 750 mg/150 mL  Status:  Discontinued        750 mg 150 mL/hr over 60 Minutes Intravenous Every 12 hours 08/13/20 0336 08/17/20 2242   08/13/20 0245  vancomycin (VANCOCIN) IVPB 1000 mg/200 mL premix        1,000 mg 200 mL/hr over 60 Minutes Intravenous  Once 08/13/20 0242 08/13/20 0609   08/12/20 2030  cefTRIAXone (ROCEPHIN) 2 g in sodium chloride 0.9 % 100 mL IVPB  Status:  Discontinued        2 g 200 mL/hr over 30 Minutes Intravenous Every 24 hours 08/12/20 2016 08/13/20 0242  Objective: Vitals: Today's Vitals   08/21/20 2100 08/21/20 2112 08/22/20 0511 08/22/20 0549  BP:  134/61 124/67   Pulse:  83 79   Resp:  18 17   Temp:  98.5 F (36.9 C) 98.1 F (36.7 C)   TempSrc:  Oral Oral   SpO2:  100% 99%   Weight:      Height:      PainSc: 0-No pain   2     Intake/Output Summary (Last 24 hours) at 08/22/2020 0742 Last data filed at 08/21/2020 2100 Gross per 24 hour  Intake 1310.83 ml  Output 1800 ml  Net -489.17 ml   Filed Weights   08/12/20 2156  Weight: 98.4 kg   Weight change:   Intake/Output from previous day: 11/03 0701 - 11/04 0700 In: 1310.8 [P.O.:740; IV Piggyback:570.8] Out: 1800 [Urine:1800] Intake/Output this shift: No intake/output data recorded.  Examination: General exam: AAOx3, obese, NAD, weak appearing. HEENT:Oral mucosa moist, Ear/Nose WNL grossly, dentition normal. Respiratory system: bilaterally clear ,no wheezing or crackles,no use of accessory muscle Cardiovascular system: S1 & S2 +, No JVD. Gastrointestinal system: Abdomen soft, NT,ND, BS+ Nervous System:Alert, awake, moving extremities and grossly nonfocal Extremities: I removed the dressing on RLEand seems to be improving with bullous lesion decreasing, more dry on medial thigh No edema, distal  peripheral pulses palpable.  Skin: No rashes,no icterus. MSK: Normal muscle bulk,tone, power   Data Reviewed: I have personally reviewed following labs and imaging studies CBC: Recent Labs  Lab 08/18/20 0600 08/19/20 1112 08/20/20 0439 08/21/20 0203 08/22/20 0035  WBC 21.3* 20.1* 16.3* 17.2* 18.2*  HGB 7.7* 8.3* 7.9* 8.2* 8.6*  HCT 26.3* 28.8* 26.2* 26.8* 28.7*  MCV 76.9* 77.8* 77.3* 75.7* 76.5*  PLT 365 444* 413* 472* 964*   Basic Metabolic Panel: Recent Labs  Lab 08/17/20 1525 08/19/20 1112 08/20/20 0439 08/21/20 0203 08/22/20 0035  NA 137 140 140 140 140  K 4.0 4.1 3.9 3.4* 3.8  CL 107 112* 112* 104 102  CO2 21* 19* 18* 25 25  GLUCOSE 128* 100* 94 101* 93  BUN 33* 10 8 6* 9  CREATININE 1.35* 0.68 0.66 0.82 0.87  CALCIUM 8.8* 8.9 8.9 9.0 8.6*   GFR: Estimated Creatinine Clearance: 64.4 mL/min (by C-G formula based on SCr of 0.87 mg/dL). Liver Function Tests: Recent Labs  Lab 08/17/20 1525 08/20/20 0439 08/21/20 0203 08/22/20 0035  AST 14* 13* 15 24  ALT 16 13 12 14   ALKPHOS 95 88 83 87  BILITOT 0.5 0.7 0.5 0.5  PROT 5.7* 5.2* 5.3* 5.8*  ALBUMIN 2.2* 2.0* 2.1* 2.4*   No results for input(s): LIPASE, AMYLASE in the last 168 hours. No results for input(s): AMMONIA in the last 168 hours. Coagulation Profile: No results for input(s): INR, PROTIME in the last 168 hours. Cardiac Enzymes: Recent Labs  Lab 08/20/20 0439  CKTOTAL 28*   BNP (last 3 results) Recent Labs    12/11/19 1105  PROBNP 26.0   HbA1C: No results for input(s): HGBA1C in the last 72 hours. CBG: Recent Labs  Lab 08/21/20 0743 08/21/20 1350 08/21/20 1625 08/21/20 2104 08/22/20 0738  GLUCAP 78 106* 88 90 86   Lipid Profile: No results for input(s): CHOL, HDL, LDLCALC, TRIG, CHOLHDL, LDLDIRECT in the last 72 hours. Thyroid Function Tests: No results for input(s): TSH, T4TOTAL, FREET4, T3FREE, THYROIDAB in the last 72 hours. Anemia Panel: No results for input(s): VITAMINB12,  FOLATE, FERRITIN, TIBC, IRON, RETICCTPCT in the last 72 hours. Sepsis Labs: Recent  Labs  Lab 08/17/20 1525  LATICACIDVEN 1.5    Recent Results (from the past 240 hour(s))  Respiratory Panel by RT PCR (Flu A&B, Covid) - Nasopharyngeal Swab     Status: None   Collection Time: 08/12/20  8:16 PM   Specimen: Nasopharyngeal Swab  Result Value Ref Range Status   SARS Coronavirus 2 by RT PCR NEGATIVE NEGATIVE Final    Comment: (NOTE) SARS-CoV-2 target nucleic acids are NOT DETECTED.  The SARS-CoV-2 RNA is generally detectable in upper respiratoy specimens during the acute phase of infection. The lowest concentration of SARS-CoV-2 viral copies this assay can detect is 131 copies/mL. A negative result does not preclude SARS-Cov-2 infection and should not be used as the sole basis for treatment or other patient management decisions. A negative result may occur with  improper specimen collection/handling, submission of specimen other than nasopharyngeal swab, presence of viral mutation(s) within the areas targeted by this assay, and inadequate number of viral copies (<131 copies/mL). A negative result must be combined with clinical observations, patient history, and epidemiological information. The expected result is Negative.  Fact Sheet for Patients:  PinkCheek.be  Fact Sheet for Healthcare Providers:  GravelBags.it  This test is no t yet approved or cleared by the [place] FDA and  has been authorized for detection and/or diagnosis of SARS-CoV-2 by FDA under an Emergency Use Authorization (EUA). This EUA will remain  in effect (meaning this test can be used) for the duration of the COVID-19 declaration under Section 564(b)(1) of the Act, 21 U.S.C. section 360bbb-3(b)(1), unless the authorization is terminated or revoked sooner.     Influenza A by PCR NEGATIVE NEGATIVE Final   Influenza B by PCR NEGATIVE NEGATIVE Final     Comment: (NOTE) The Xpert Xpress SARS-CoV-2/FLU/RSV assay is intended as an aid in  the diagnosis of influenza from Nasopharyngeal swab specimens and  should not be used as a sole basis for treatment. Nasal washings and  aspirates are unacceptable for Xpert Xpress SARS-CoV-2/FLU/RSV  testing.  Fact Sheet for Patients: PinkCheek.be  Fact Sheet for Healthcare Providers: GravelBags.it  This test is not yet approved or cleared by the [place] FDA and  has been authorized for detection and/or diagnosis of SARS-CoV-2 by  FDA under an Emergency Use Authorization (EUA). This EUA will remain  in effect (meaning this test can be used) for the duration of the  Covid-19 declaration under Section 564(b)(1) of the Act, 21  U.S.C. section 360bbb-3(b)(1), unless the authorization is  terminated or revoked. Performed at [facility], [place] [address], [place], [place] [address]   Blood Culture (routine x 2)     Status: None   Collection Time: 08/12/20  8:18 PM   Specimen: BLOOD  Result Value Ref Range Status   Specimen Description BLOOD LEFT ANTECUBITAL  Final   Special Requests   Final    BOTTLES DRAWN AEROBIC AND ANAEROBIC Blood Culture adequate volume   Culture   Final    NO GROWTH 5 DAYS Performed at [facility], [place] [address] [place], [place] [address]    Report Status 08/17/2020 FINAL  Final  Blood Culture (routine x 2)     Status: None   Collection Time: 08/12/20  8:20 PM   Specimen: BLOOD  Result Value Ref Range Status   Specimen Description BLOOD RIGHT ANTECUBITAL  Final   Special Requests   Final    BOTTLES DRAWN AEROBIC AND ANAEROBIC Blood Culture results may not be  optimal due to an excessive volume of blood received in culture bottles   Culture   Final    NO GROWTH 5 DAYS Performed at [facility], [place] [address], [place], [place] [address]    Report Status 08/17/2020 FINAL  Final  Urine  culture     Status: Abnormal   Collection Time: 08/13/20 12:11 AM   Specimen: In/Out Cath Urine  Result Value Ref Range Status   Specimen Description IN/OUT CATH URINE  Final   Special Requests NONE  Final   Culture (A)  Final    <10,000 COLONIES/mL INSIGNIFICANT GROWTH Performed at [facility] [address] [place], [place] [address]    Report Status 08/14/2020 FINAL  Final  Aerobic Culture (superficial specimen)     Status: None   Collection Time: 08/13/20  2:43 AM   Specimen: Wound  Result Value Ref Range Status   Specimen Description WOUND RIGHT LOWER LEG  Final   Special Requests NONE  Final   Gram Stain   Final    RARE WBC PRESENT, PREDOMINANTLY PMN NO ORGANISMS SEEN Performed at [facility], [place] [address], [place], [place] [address]    Culture   Final    MODERATE CORYNEBACTERIUM PSEUDODIPHTHERIAE Standardized susceptibility testing for this organism is not available. RARE STAPHYLOCOCCUS EPIDERMIDIS    Report Status 08/16/2020 FINAL  Final   Organism ID, Bacteria STAPHYLOCOCCUS EPIDERMIDIS  Final      Susceptibility   Staphylococcus epidermidis - MIC*    CIPROFLOXACIN >=8 RESISTANT Resistant     ERYTHROMYCIN >=8 RESISTANT Resistant     GENTAMICIN <=0.5 SENSITIVE Sensitive     OXACILLIN >=4 RESISTANT Resistant     TETRACYCLINE >=16 RESISTANT Resistant     VANCOMYCIN 1 SENSITIVE Sensitive     TRIMETH/SULFA <=10 SENSITIVE Sensitive     CLINDAMYCIN <=0.25 SENSITIVE Sensitive     RIFAMPIN <=0.5 SENSITIVE Sensitive     Inducible Clindamycin NEGATIVE Sensitive     * RARE STAPHYLOCOCCUS EPIDERMIDIS     Radiology Studies: No results found.   LOS: 9 days   Antonieta Pert, MD [facility]  08/22/2020, 7:42 AM

## 2020-08-22 NOTE — Plan of Care (Signed)

## 2020-08-23 DIAGNOSIS — A419 Sepsis, unspecified organism: Secondary | ICD-10-CM | POA: Diagnosis not present

## 2020-08-23 DIAGNOSIS — L0390 Cellulitis, unspecified: Secondary | ICD-10-CM | POA: Diagnosis not present

## 2020-08-23 LAB — CBC
HCT: 26.8 % — ABNORMAL LOW (ref 36.0–46.0)
Hemoglobin: 8.1 g/dL — ABNORMAL LOW (ref 12.0–15.0)
MCH: 23.3 pg — ABNORMAL LOW (ref 26.0–34.0)
MCHC: 30.2 g/dL (ref 30.0–36.0)
MCV: 77 fL — ABNORMAL LOW (ref 80.0–100.0)
Platelets: 475 10*3/uL — ABNORMAL HIGH (ref 150–400)
RBC: 3.48 MIL/uL — ABNORMAL LOW (ref 3.87–5.11)
RDW: 24 % — ABNORMAL HIGH (ref 11.5–15.5)
WBC: 12.7 10*3/uL — ABNORMAL HIGH (ref 4.0–10.5)
nRBC: 0.2 % (ref 0.0–0.2)

## 2020-08-23 LAB — GLUCOSE, CAPILLARY
Glucose-Capillary: 96 mg/dL (ref 70–99)
Glucose-Capillary: 113 mg/dL — ABNORMAL HIGH (ref 70–99)
Glucose-Capillary: 92 mg/dL (ref 70–99)
Glucose-Capillary: 91 mg/dL (ref 70–99)

## 2020-08-23 LAB — RESPIRATORY PANEL BY RT PCR (FLU A&B, COVID)
Influenza A by PCR: NEGATIVE
Influenza B by PCR: NEGATIVE
SARS Coronavirus 2 by RT PCR: NEGATIVE

## 2020-08-23 MED ORDER — FERROUS SULFATE 325 (65 FE) MG PO TABS
325.0000 mg | ORAL_TABLET | Freq: Two times a day (BID) | ORAL | 3 refills | Status: DC
Start: 2020-08-23 — End: 2024-02-17

## 2020-08-23 MED ORDER — TRAMADOL HCL 50 MG PO TABS
50.0000 mg | ORAL_TABLET | Freq: Four times a day (QID) | ORAL | 0 refills | Status: DC | PRN
Start: 2020-08-23 — End: 2021-06-20

## 2020-08-23 MED ORDER — CEPHALEXIN 500 MG PO CAPS: 500.0000 mg | ORAL_CAPSULE | Freq: Four times a day (QID) | ORAL | 0 refills | Status: AC

## 2020-08-23 MED ORDER — CYANOCOBALAMIN 1000 MCG PO TABS: 1000.0000 ug | ORAL_TABLET | Freq: Every day | ORAL | 0 refills | Status: AC

## 2020-08-23 NOTE — TOC Progression Note (Signed)
Transition of Care [facility]) - Progression Note    Patient Details  Name: [name] MRN: [id number] Date of Birth: [birth year]  Transition of Care [facility]) CM/SW [place], RN Phone Number: [phone]  08/23/2020, 11:36 AM  Clinical Narrative:    CM called [employer] to acquire patients authorization number. [employer] has no record of clinicals faxed on 08/21/20. Information has been submitted via the phone. Reference #[id number]. Clinicals will be faxed again. Everlene Balls is unable to give an estimate of how long it will take for authorization. Authorization pending.   Expected Discharge Plan: [place] Barriers to Discharge: Continued Medical Work up  Expected Discharge Plan and Services Expected Discharge Plan: [place] In-house Referral: NA Discharge Planning Services: CM Consult Post Acute Care Choice: [place] Living arrangements for the past 2 months: Single Family Home Expected Discharge Date: 08/23/20               DME Arranged: N/A DME Agency: NA       HH Arranged: NA HH Agency: NA         Social Determinants of Health (SDOH) Interventions    Readmission Risk Interventions No flowsheet data found.

## 2020-08-23 NOTE — Discharge Summary (Signed)
Physician Discharge Summary  Tanveer Dobberstein IOE:[id number] DOB: 21-Feb-1946 DOA: 08/12/2020  PCP: Biagio Borg, MD  Admit date: 08/12/2020 Discharge date: 08/23/2020  Admitted From: home Disposition: Skilled nursing facility  Recommendations for Outpatient Follow-up:  1. Follow up with PCP in 1-2 weeks 2. Please obtain BMP/CBC in one week 3. Please follow up on the following pending results:  Home Health: Home Equipment/Devices: Skilled nursing facility  Discharge Condition: Stable Code Status:   Code Status: DNR Diet recommendation:  Diet Order            Diet - low sodium heart healthy           Diet regular Room service appropriate? Yes; Fluid consistency: Thin  Diet effective now                  Brief/Interim Summary: 74 year old female with GERD, HTN, HLD, osteoarthritis, chronic venous insufficiency, PE diagnosed in March 2021 was on Xarelto completed on 07/2020 presented to [name], ED with headache, lightheadedness and 1 to weeks history of blister on the anterior surface of her right leg that had burst and was having drainage in the area along with redness and swelling in the surrounding tissue. In the ED she was found to meet criteria for severe sepsis with fever, tachycardia, tachypnea, lactic acidosis along with right lower extremity cellulitis. CT LE on 10/30 showed subcutaneous edema within the right thigh and foreleg,no discrete drainable fluid collection identified. Placed on broad-spectrum antibiotics and was admitted. Patient was on vancomycin after adding cefepime started to respond.  Seen by infectious disease-antibiotic and escalated to Ancef. Patient was monitored for few more days. She has clinically improved, leukocytosis downtrending, afebrile, pain is improved.  She is being discharged to skilled nursing facility.  Discharge Diagnoses:   Severe Sepsis due to cellulitis on RLE: POA, met severe sepsis with significant leukocytosis 34k,  tachycardia, lactic acidosis 2.5, fever of >100.4.  Sepsis parameters resolving, antibiotics with Ancef as per infectious disease.  Superficial culture grew staph epidermidis, oxacillin resistant unclear significance-subsequently wbc downtrending on Ancef, not adding additional antibiotics. Leukocytosis downtrending 12,000.  At this time she is medically stable she will be discharged on Keflex through 11/7 as per ID recommendation continue wound care locally and follow-up with dermatology   Bullous skin lesion with itching on the Rt leg predisposing to cellulitis.  Was present prior to admission was referred to dermatology by pcp and advised to follow-up Chronic diastolic CHF: Back on home Lasix to help with lower leg edema.  Renal function is stable.  Monitor intake output Daily weight.  Recent Labs  Lab 08/17/20 1525 08/19/20 1112 08/20/20 0439 08/21/20 0203 08/22/20 0035  BUN 33* 10 8 6* 9  CREATININE 1.35* 0.68 0.66 0.82 0.87   Hypokalemia repleted. Essential hypertension: BP controlled.  Had episode of hypotension 08/17/20 needing IV fluid bolus.  Continue metoprolol and Lasix.  Severe anemia, microcytic as low as 6.8 g.  Received 2 units PRBC.  Hb has been stable in 8 g range.  No black or tarry stool, FOBT  Was negative 08/13/20.Previously lower GI bleed in 2016 and had endoscopy/capsule endoscopy and colonoscopy positive for diverticulosis and hemorrhoids pathology negative for malignancy.  Will need outpatient GI evaluation.  Hemoglobin holding stable. Metabolic acidosis resolved, off IV fluids.   AKI creatinine peaked to 1.3 has improved to 0.6 and is off ivf Mixed hyperlipidemia: Continue statin. Back pain with recent fall at home imaging negative for any acute fracture continue  PT OT as tolerated. GERD on PPI. Morbid obesity with BMI 38.4: will benefit with weight loss and healthy lifestyle.  Advise outpatient follow-up with her primary care doctor.  Consults: Infectious  disease  Subjective: Alert awake agreeable for discharge to skilled nursing facility today.  No new complaints  Discharge Exam: Vitals:   08/22/20 2009 08/23/20 0536  BP: (!) 130/58 (!) 123/51  Pulse: 84 84  Resp: 15 18  Temp: 98.4 F (36.9 C) 98.6 F (37 C)  SpO2: 99% 96%   General: Pt is alert, awake, not in acute distress Cardiovascular: RRR, S1/S2 +, no rubs, no gallops Respiratory: CTA bilaterally, no wheezing, no rhonchi Abdominal: Soft, NT, ND, bowel sounds + Extremities: Mild swelling in lower extremities, right medial leg with improving wound, dry,  Discharge Instructions  Discharge Instructions    Diet - low sodium heart healthy   Complete by: As directed    Discharge instructions   Complete by: As directed    Please call call MD or return to ER for similar or worsening recurring problem that brought you to hospital or if any fever,nausea/vomiting,abdominal pain, uncontrolled pain, chest pain,  shortness of breath or any other alarming symptoms.  Follow-up: Dermatology/skin doctor in 1 to 2 weeks  Please follow-up your doctor as instructed in a week time and call the office for appointment.  Please avoid alcohol, smoking, or any other illicit substance and maintain healthy habits including taking your regular medications as prescribed.  You were cared for by a hospitalist during your hospital stay. If you have any questions about your discharge medications or the care you received while you were in the hospital after you are discharged, you can call the unit and ask to speak with the hospitalist on call if the hospitalist that took care of you is not available.  Once you are discharged, your primary care physician will handle any further medical issues. Please note that NO REFILLS for any discharge medications will be authorized once you are discharged, as it is imperative that you return to your primary care physician (or establish a relationship with a primary care  physician if you do not have one) for your aftercare needs so that they can reassess your need for medications and monitor your lab values   Discharge wound care:   Complete by: As directed    Gently cleanse any intact or fluid filled bullae with soap and water. Pat dry. Place Xeroform gauzes  over the areas, secure with kerlex. Change daily and prn.  Moisten old dressings with saline prior to removing.  Wound care  Daily      Comments: Right anterior leg wound (full thickness):  Wash LE with soap and water, rinse and pat dry. Cover with xeroform gauze , top with dry gauze 4x4, secure with Kerlix/paper tape. Change dai   Increase activity slowly   Complete by: As directed      Allergies as of 08/23/2020      Reactions   Soybean-containing Drug Products Other (See Comments)   Allergy testing positive for soy beans but pt uses soy sauce   Sulfonamide Derivatives Itching, Rash      Medication List    TAKE these medications   atorvastatin 20 MG tablet Commonly known as: LIPITOR TAKE 1 TABLET BY MOUTH  DAILY What changed: Another medication with the same name was removed. Continue taking this medication, and follow the directions you see here.   azelastine 0.1 % nasal spray Commonly known as:  ASTELIN Place 1 spray into both nostrils 2 (two) times daily. Use in each nostril as directed What changed:   when to take this  reasons to take this   cephALEXin 500 MG capsule Commonly known as: KEFLEX Take 1 capsule (500 mg total) by mouth 4 (four) times daily for 3 days.   cetirizine 10 MG tablet Commonly known as: ZYRTEC Take 1 tablet (10 mg total) by mouth daily. As needed What changed:   when to take this  reasons to take this   cyanocobalamin 1000 MCG tablet Take 1 tablet (1,000 mcg total) by mouth daily. Start taking on: August 24, 2020   Dilt-XR 240 MG 24 hr capsule Generic drug: diltiazem TAKE 1 CAPSULE BY MOUTH  DAILY   DULoxetine 20 MG capsule Commonly known as:  CYMBALTA Take 2 capsules (40 mg total) by mouth daily.   esomeprazole 40 MG capsule Commonly known as: NEXIUM TAKE 1 CAPSULE BY MOUTH  DAILY   ferrous sulfate 325 (65 FE) MG tablet Take 1 tablet (325 mg total) by mouth 2 (two) times daily with a meal.   Fluzone High-Dose Quadrivalent 0.7 ML Susy Generic drug: Influenza Vac High-Dose Quad   furosemide 80 MG tablet Commonly known as: LASIX Take 1 tablet (80 mg total) by mouth 2 (two) times daily.   gabapentin 100 MG capsule Commonly known as: NEURONTIN TAKE 2 CAPSULES(200 MG) BY MOUTH AT BEDTIME   hydrOXYzine 10 MG tablet Commonly known as: ATARAX/VISTARIL Take 1 tablet (10 mg total) by mouth every 8 (eight) hours as needed for itching.   losartan 100 MG tablet Commonly known as: COZAAR TAKE 1 TABLET BY MOUTH  DAILY   metoprolol tartrate 50 MG tablet Commonly known as: LOPRESSOR TAKE 1 TABLET BY MOUTH  TWICE DAILY   MIRALAX PO Take 17 g by mouth daily as needed (constipation).   potassium chloride 10 MEQ tablet Commonly known as: KLOR-CON TAKE 1 TABLET(10 MEQ) BY MOUTH DAILY What changed: See the new instructions.   Prednisol Ace-Moxiflox-Bromfen 1-0.5-0.075 % Susp Place 1 drop into the right eye 4 (four) times daily.   SYSTANE OP Place 1 drop into both eyes 3 (three) times daily as needed (dry eyes).   tiZANidine 2 MG tablet Commonly known as: ZANAFLEX Take 2 mg by mouth every 6 (six) hours as needed for muscle spasms.   traMADol 50 MG tablet Commonly known as: ULTRAM Take 1-2 tablets (50-100 mg total) by mouth every 6 (six) hours as needed for up to 4 doses for moderate pain.   triamcinolone cream 0.1 % Commonly known as: KENALOG Apply 1 application topically 2 (two) times daily.            Discharge Care Instructions  (From admission, onward)         Start     Ordered   08/23/20 0000  Discharge wound care:       Comments: Gently cleanse any intact or fluid filled bullae with soap and water. Pat  dry. Place Xeroform gauzes  over the areas, secure with kerlex. Change daily and prn.  Moisten old dressings with saline prior to removing.  Wound care  Daily      Comments: Right anterior leg wound (full thickness):  Wash LE with soap and water, rinse and pat dry. Cover with xeroform gauze , top with dry gauze 4x4, secure with Kerlix/paper tape. Change dai   08/23/20 1032          Follow-up Information    John,  Hunt Oris, MD Follow up in 1 week(s).   Specialties: Internal Medicine, Radiology Contact information: [address] [place] [phone]              Allergies  Allergen Reactions  . 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 Sulfonamide Derivatives Itching and Rash    The results of significant diagnostics from this hospitalization (including imaging, microbiology, ancillary and laboratory) are listed below for reference.    Microbiology: No results found for this or any previous visit (from the past 240 hour(s)).  Procedures/Studies: DG Chest 1 View  Result Date: 08/12/2020 CLINICAL DATA:  Headache post fall EXAM: CHEST  1 VIEW COMPARISON:  04/12/2019 FINDINGS: The heart size and mediastinal contours are within normal limits. Aortic atherosclerosis. Both lungs are clear. The visualized skeletal structures are unremarkable. IMPRESSION: No active disease. Electronically Signed   By: Donavan Foil M.D.   On: 08/12/2020 21:12   DG Lumbar Spine 2-3 Views  Result Date: 08/13/2020 CLINICAL DATA:  Fall.  Dizziness. EXAM: LUMBAR SPINE - 2-3 VIEW COMPARISON:  Lumbar spine radiographs 11/10/2017 FINDINGS: Grade 1 anterolisthesis is present at L3-4 at L4-5 without significant interval change. Remote superior endplate fracture at L1 is noted. Vertebral body heights are otherwise normal. No acute fractures are present. Vascular calcifications are noted in the aorta. Calcified uterine  fibroid is stable. Visualized pelvis is unremarkable. IMPRESSION: 1. No acute abnormality or significant interval change. 2. Stable grade 1 anterolisthesis at L3-4 and L4-5. 3. Remote superior endplate fracture at L1. Electronically Signed   By: San Morelle M.D.   On: 08/13/2020 05:50   DG Tibia/Fibula Right  Result Date: 08/12/2020 CLINICAL DATA:  Right leg pain after fall EXAM: RIGHT TIBIA AND FIBULA - 2 VIEW COMPARISON:  05/22/2014 FINDINGS: Right knee replacement. No acute displaced fracture or malalignment. Diffuse subcutaneous edema IMPRESSION: No acute osseous abnormality. Electronically Signed   By: Donavan Foil M.D.   On: 08/12/2020 21:13   CT FOOT RIGHT W CONTRAST  Result Date: 08/17/2020 CLINICAL DATA:  Cellulitis, epidermolysis of the right lower extremity EXAM: CT OF THE LOWER RIGHT EXTREMITY WITH CONTRAST CT OF THE RIGHT FOOT WITH CONTRAST CT OF THE RIGHT TIBIA AND FIBULA WITH CONTRAST TECHNIQUE: Multidetector CT imaging of the lower right extremity, right tibia and fibula, and right foot was performed according to the standard protocol following intravenous contrast administration. COMPARISON:  None. CONTRAST:  163m OMNIPAQUE IOHEXOL 350 MG/ML SOLN FINDINGS: Bones/Joint/Cartilage Mild right hip degenerative arthritis. Status post right total knee arthroplasty with arthroplasty components in expected alignment. 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 No acute fracture or dislocation. No cortical erosion. No lytic or blastic bone lesion. Ligaments Suboptimally assessed by CT. Muscles and Tendons Mild relative fatty infiltration of the vastus lateralis. Otherwise no other significant findings. Soft tissues Moderate subcutaneous edema predominantly anteriorly and laterally within the proximal thigh and  circumferentially within the mid right foreleg. No discrete drainable fluid collection identified. Postsurgical changes noted within the anterior distal thigh and proximal right foreleg related to total knee arthroplasty. Shotty adenopathy noted within the right inguinal region. 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 IMPRESSION: Subcutaneous edema within the right thigh and foreleg. No discrete drainable fluid collection identified. No asymmetric muscular  enlargement or infiltration suggest acute myositis. No cortical erosion to suggest osteomyelitis. Electronically Signed   By: Fidela Salisbury MD   On: 08/17/2020 20:22   CT FEMUR RIGHT W CONTRAST  Result Date: 08/17/2020 CLINICAL DATA:  Cellulitis, epidermolysis of the right lower extremity EXAM: CT OF THE LOWER RIGHT EXTREMITY WITH CONTRAST CT OF THE RIGHT FOOT WITH CONTRAST CT OF THE RIGHT TIBIA AND FIBULA WITH CONTRAST TECHNIQUE: Multidetector CT imaging of the lower right extremity, right tibia and fibula, and right foot was performed according to the standard protocol following intravenous contrast administration. COMPARISON:  None. CONTRAST:  147m OMNIPAQUE IOHEXOL 350 MG/ML SOLN FINDINGS: Bones/Joint/Cartilage Mild right hip degenerative arthritis. Status post right total knee arthroplasty with arthroplasty components in expected alignment. 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 No acute fracture or dislocation. No cortical erosion. No lytic or blastic bone lesion. Ligaments Suboptimally assessed by CT. Muscles and Tendons Mild relative fatty infiltration of the vastus lateralis. Otherwise no other significant findings. Soft tissues Moderate subcutaneous edema predominantly anteriorly and laterally within the proximal thigh and  circumferentially within the mid right foreleg. No discrete drainable fluid collection identified. Postsurgical changes noted within the anterior distal thigh and proximal right foreleg related to total knee arthroplasty. Shotty adenopathy noted within the right inguinal region. 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 IMPRESSION: Subcutaneous edema within the right thigh and foreleg. No discrete drainable fluid collection identified. No asymmetric muscular enlargement or infiltration suggest acute myositis. No cortical erosion to suggest osteomyelitis. Electronically Signed   By: AFidela SalisburyMD   On: 08/17/2020 20:22   CT TIBIA FIBULA RIGHT W CONTRAST  Result Date: 08/17/2020 CLINICAL DATA:  Cellulitis, epidermolysis of the right lower extremity EXAM: CT OF THE LOWER RIGHT EXTREMITY WITH CONTRAST CT OF THE RIGHT FOOT WITH CONTRAST CT OF THE RIGHT TIBIA AND FIBULA WITH CONTRAST TECHNIQUE: Multidetector CT imaging of the lower right extremity, right tibia and fibula, and right foot was performed according to the standard protocol following intravenous contrast administration. COMPARISON:  None. CONTRAST:  1060mOMNIPAQUE IOHEXOL 350 MG/ML SOLN FINDINGS: Bones/Joint/Cartilage Mild right hip degenerative arthritis. Status post right total knee arthroplasty with arthroplasty components in expected alignment. 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 No acute fracture or dislocation. No cortical erosion. No lytic or blastic bone lesion. Ligaments Suboptimally assessed by CT. Muscles and Tendons Mild relative fatty infiltration of the vastus lateralis. Otherwise no other significant findings. Soft tissues Moderate subcutaneous edema predominantly anteriorly and laterally within the proximal thigh and  circumferentially within the mid right foreleg. No discrete drainable fluid collection identified. Postsurgical changes noted within the anterior distal thigh and proximal right foreleg related to total knee arthroplasty. Shotty adenopathy noted within the right inguinal region. 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 IMPRESSION: Subcutaneous edema within the right thigh and foreleg. No discrete drainable fluid collection identified. No asymmetric muscular enlargement or infiltration suggest acute myositis. No cortical erosion to suggest osteomyelitis. Electronically Signed   By: AsFidela SalisburyD   On: 08/17/2020 20:22   VAS [place]OWER EXTREMITY VENOUS (DVT)  Result Date: 08/15/2020  Lower Venous DVTStudy Indications: Pain, Swelling, Edema, and Erythema.  Risk Factors: Obesity. Performing  Technologist: Griffin Basil RCT RDMS  Examination Guidelines: A complete evaluation includes B-mode imaging, spectral Doppler, color Doppler, and power Doppler as needed of all accessible portions of each vessel. Bilateral testing is considered an integral part of a complete examination. Limited examinations for reoccurring indications may be performed as noted. The reflux portion of the exam is performed with the patient in reverse Trendelenburg.  +---------+---------------+---------+-----------+----------+-----------------+ RIGHT    CompressibilityPhasicitySpontaneityPropertiesThrombus Aging    +---------+---------------+---------+-----------+----------+-----------------+ CFV      Full           Yes      Yes                                    +---------+---------------+---------+-----------+----------+-----------------+ SFJ      Full                                                           +---------+---------------+---------+-----------+----------+-----------------+ FV Prox  Full                                                            +---------+---------------+---------+-----------+----------+-----------------+ FV Mid   Full                                                           +---------+---------------+---------+-----------+----------+-----------------+ FV Distal               Yes      Yes                                    +---------+---------------+---------+-----------+----------+-----------------+ PFV      Full                                                           +---------+---------------+---------+-----------+----------+-----------------+ POP                     Yes      Yes                                    +---------+---------------+---------+-----------+----------+-----------------+ PTV                                                   Color images only +---------+---------------+---------+-----------+----------+-----------------+ PERO  Color images only +---------+---------------+---------+-----------+----------+-----------------+ Limited distal leg evaluation due to pain and swelling . Color images only , No compression images due to pain.  +----+---------------+---------+-----------+----------+--------------+ LEFTCompressibilityPhasicitySpontaneityPropertiesThrombus Aging +----+---------------+---------+-----------+----------+--------------+ CFV                Yes      Yes                                 +----+---------------+---------+-----------+----------+--------------+     Summary: RIGHT: - There is no evidence of deep vein thrombosis in the lower extremity.  - No cystic structure found in the popliteal fossa.  LEFT: - No evidence of common femoral vein obstruction.  *See table(s) above for measurements and observations. Electronically signed by Monica Martinez MD on 08/15/2020 at 3:05:10 PM.    Final    VAS [place] LOWER EXTREMITY VENOUS (DVT)  Result Date: 08/13/2020  Lower Venous DVTStudy Indications: Pain,  Swelling, and Edema.  Risk Factors: Obesity. Performing Technologist: Griffin Basil RCT RDMS  Examination Guidelines: A complete evaluation includes B-mode imaging, spectral Doppler, color Doppler, and power Doppler as needed of all accessible portions of each vessel. Bilateral testing is considered an integral part of a complete examination. Limited examinations for reoccurring indications may be performed as noted. The reflux portion of the exam is performed with the patient in reverse Trendelenburg.  +---------+---------------+---------+-----------+----------+-------------------+ RIGHT    CompressibilityPhasicitySpontaneityPropertiesThrombus Aging      +---------+---------------+---------+-----------+----------+-------------------+ CFV      Full           Yes      Yes                                      +---------+---------------+---------+-----------+----------+-------------------+ SFJ      Full                                                             +---------+---------------+---------+-----------+----------+-------------------+ FV Prox  Full                                                             +---------+---------------+---------+-----------+----------+-------------------+ FV Mid   Full                                                             +---------+---------------+---------+-----------+----------+-------------------+ FV DistalFull                                                             +---------+---------------+---------+-----------+----------+-------------------+ PFV      Full                                                             +---------+---------------+---------+-----------+----------+-------------------+  POP      Full           Yes      Yes                                      +---------+---------------+---------+-----------+----------+-------------------+ PTV                                                    Non Visualized due                                                        to Edema - Obesity                                                        and inability to                                                          tolorate probe                                                            placement           +---------+---------------+---------+-----------+----------+-------------------+ PERO                                                  Non Visualized Due                                                        to Edema, Obesity                                                         and inability to                                                          tolorate probe  placement .         +---------+---------------+---------+-----------+----------+-------------------+     Summary: RIGHT: - There is no evidence of deep vein thrombosis in the lower extremity.  - No cystic structure found in the popliteal fossa.  LEFT: - No evidence of common femoral vein obstruction.  *See table(s) above for measurements and observations. Electronically signed by Deitra Mayo MD on 08/13/2020 at 5:40:00 PM.    Final     Labs: BNP (last 3 results) No results for input(s): BNP in the last 8760 hours. Basic Metabolic Panel: Recent Labs  Lab 08/17/20 1525 08/19/20 1112 08/20/20 0439 08/21/20 0203 08/22/20 0035  NA 137 140 140 140 140  K 4.0 4.1 3.9 3.4* 3.8  CL 107 112* 112* 104 102  CO2 21* 19* 18* 25 25  GLUCOSE 128* 100* 94 101* 93  BUN 33* 10 8 6* 9  CREATININE 1.35* 0.68 0.66 0.82 0.87  CALCIUM 8.8* 8.9 8.9 9.0 8.6*   Liver Function Tests: Recent Labs  Lab 08/17/20 1525 08/20/20 0439 08/21/20 0203 08/22/20 0035  AST 14* 13* 15 24  ALT 16 13 12 14   ALKPHOS 95 88 83 87  BILITOT 0.5 0.7 0.5 0.5  PROT 5.7* 5.2* 5.3* 5.8*  ALBUMIN 2.2* 2.0* 2.1* 2.4*   No results  for input(s): LIPASE, AMYLASE in the last 168 hours. No results for input(s): AMMONIA in the last 168 hours. CBC: Recent Labs  Lab 08/19/20 1112 08/20/20 0439 08/21/20 0203 08/22/20 0035 08/23/20 0817  WBC 20.1* 16.3* 17.2* 18.2* 12.7*  HGB 8.3* 7.9* 8.2* 8.6* 8.1*  HCT 28.8* 26.2* 26.8* 28.7* 26.8*  MCV 77.8* 77.3* 75.7* 76.5* 77.0*  PLT 444* 413* 472* 419* 475*   Cardiac Enzymes: Recent Labs  Lab 08/20/20 0439  CKTOTAL 28*   BNP: Invalid input(s): POCBNP CBG: Recent Labs  Lab 08/22/20 0738 08/22/20 1253 08/22/20 1602 08/22/20 2033 08/23/20 0743  GLUCAP 86 93 115* 117* 96   D-Dimer No results for input(s): DDIMER in the last 72 hours. Hgb A1c No results for input(s): HGBA1C in the last 72 hours. Lipid Profile No results for input(s): CHOL, HDL, LDLCALC, TRIG, CHOLHDL, LDLDIRECT in the last 72 hours. Thyroid function studies No results for input(s): TSH, T4TOTAL, T3FREE, THYROIDAB in the last 72 hours.  Invalid input(s): FREET3 Anemia work up No results for input(s): VITAMINB12, FOLATE, FERRITIN, TIBC, IRON, RETICCTPCT in the last 72 hours. Urinalysis    Component Value Date/Time   COLORURINE YELLOW 08/13/2020 0012   APPEARANCEUR CLEAR 08/13/2020 0012   LABSPEC 1.014 08/13/2020 0012   PHURINE 7.0 08/13/2020 0012   GLUCOSEU NEGATIVE 08/13/2020 0012   GLUCOSEU NEGATIVE 02/06/2020 1039   HGBUR NEGATIVE 08/13/2020 0012   BILIRUBINUR NEGATIVE 08/13/2020 0012   BILIRUBINUR neg 03/09/2017 1759   KETONESUR NEGATIVE 08/13/2020 0012   PROTEINUR NEGATIVE 08/13/2020 0012   UROBILINOGEN 0.2 02/06/2020 1039   NITRITE NEGATIVE 08/13/2020 0012   LEUKOCYTESUR NEGATIVE 08/13/2020 0012   Sepsis Labs Invalid input(s): PROCALCITONIN,  WBC,  LACTICIDVEN Microbiology No results found for this or any previous visit (from the past 240 hour(s)).   Time coordinating discharge: 35  minutes  SIGNED: Antonieta Pert, MD  [facility] 08/23/2020, 10:32 AM  If 7PM-7AM,  please contact night-coverage [email or web address]

## 2020-08-23 NOTE — TOC Transition Note (Addendum)
Transition of Care [facility]) - CM/SW Discharge Note   Patient Details  Name: [name] MRN: [id number] Date of Birth: [birth year]  [facility] CM/SW Contact:  Angelita Ingles, RN Phone Number: [phone]  08/23/2020, 3:01 PM   Clinical Narrative:    Patient to be discharged to [facility]. Insurance auth approved and d/c summary has been sent via the hub. Patient has been updated and she will update the family. Transportation has been set up via [place]. Bedside nurse updated. D/c packet at the nurses station.  Please call report to [facility] Room # 115P    Final next level of care: Skilled Nursing Facility Barriers to Discharge: No Barriers Identified   Patient Goals and CMS Choice Patient states their goals for this hospitalization and ongoing recovery are:: Wants to gain strength so that she can go home. CMS Medicare.gov Compare Post Acute Care list provided to:: Patient Choice offered to / list presented to : Patient  Discharge Placement              Patient chooses bed at: [facility] Patient to be transferred to facility by: [place] Name of family member notified: Patient states she will notify family    Discharge Plan and Services In-house Referral: NA Discharge Planning Services: CM Consult Post Acute Care Choice: [place]          DME Arranged: N/A DME Agency: NA       HH Arranged: NA HH Agency: NA        Social Determinants of Health (SDOH) Interventions     Readmission Risk Interventions No flowsheet data found.

## 2020-08-23 NOTE — Progress Notes (Signed)
Report called to [facility] to [place], LPN. Full report given, and all questions answered.

## 2020-08-23 NOTE — Progress Notes (Signed)
Occupational Therapy Treatment Patient Details Name: [name] MRN: [id number] DOB: 07-16-1946 Today's Date: 08/23/2020    History of present illness Pt is a 74 y.o. female admitted 08/12/20 with c/o lethargy, weakness and poor appetite that started after RLE blister burst 1-2 wks prior; pt also with recent fall. Workup for sepsis secondary to cellulitis. (-) BLE DVT. PMH includes GERD, HTN, HLD, OA, PE, chronic venous insufficiency.   OT comments  Patient seated in recliner upon entry, agreeable to OT session.  Completing transfers and in room mobility with supervision using RW, given cueing for hand placement and safety/RW mgmt.  Grooming at sink with supervision for task completion and sequencing.  Cognitive deficits noted in attention, memory, sequencing, and problem solving, recommend 24/7 support at dc and updated dc plan to SNF.  Will follow acutely.      Follow Up Recommendations  SNF;Supervision/Assistance - 24 hour    Equipment Recommendations  3 in 1 bedside commode    Recommendations for Other Services      Precautions / Restrictions Precautions Precautions: Fall Precaution Comments: edema and blisters RLE Restrictions Weight Bearing Restrictions: No       Mobility Bed Mobility               General bed mobility comments: in recliner upon entry   Transfers Overall transfer level: Needs assistance Equipment used: Rolling walker (2 wheeled) Transfers: Sit to/from Stand Sit to Stand: Supervision         General transfer comment: cueing for hand placement and safety    Balance Overall balance assessment: Needs assistance Sitting-balance support: Feet supported Sitting balance-Leahy Scale: Good     Standing balance support: Bilateral upper extremity supported;During functional activity;No upper extremity supported Standing balance-Leahy Scale: Fair Standing balance comment: preference to UE support, grooming at sink with 0 hand support  supervision                            ADL either performed or assessed with clinical judgement   ADL Overall ADL's : Needs assistance/impaired     Grooming: Supervision/safety;Standing;Wash/dry hands;Oral care Grooming Details (indicate cue type and reason): requires cueing to seqencing steps with completing oral care (rising tooth brush)                  Toilet Transfer: Supervision/safety;Ambulation;RW Toilet Transfer Details (indicate cue type and reason): simulated to/from recliner          Functional mobility during ADLs: Supervision/safety;Rolling walker;Cueing for safety General ADL Comments: pt limited by cognition, weakness and decreased activity tolerance      Vision       Perception     Praxis      Cognition Arousal/Alertness: Awake/alert Behavior During Therapy: WFL for tasks assessed/performed Overall Cognitive Status: Impaired/Different from baseline Area of Impairment: Following commands;Memory;Attention;Safety/judgement;Awareness;Problem solving                   Current Attention Level: Sustained Memory: Decreased short-term memory Following Commands: Follows one step commands consistently;Follows one step commands with increased time Safety/Judgement: Decreased awareness of deficits;Decreased awareness of safety Awareness: Emergent Problem Solving: Slow processing;Decreased initiation;Difficulty sequencing;Requires verbal cues General Comments: pt with decreased attention, following multiple step commands, sequencing, and STM on short blessed test, difficulty completing dual cognitive task (counting while walking)         Exercises     Shoulder Instructions       General Comments  Pertinent Vitals/ Pain       Pain Assessment: No/denies pain  Home Living                                          Prior Functioning/Environment              Frequency  Min 2X/week        Progress  Toward Goals  OT Goals(current goals can now be found in the care plan section)  Progress towards OT goals: Progressing toward goals  Acute Rehab OT Goals Patient Stated Goal: to go to rehab and then home OT Goal Formulation: With patient  Plan Discharge plan needs to be updated;Frequency remains appropriate    Co-evaluation                 AM-PAC OT "6 Clicks" Daily Activity     Outcome Measure   Help from another person eating meals?: None Help from another person taking care of personal grooming?: A Little Help from another person toileting, which includes using toliet, bedpan, or urinal?: A Little Help from another person bathing (including washing, rinsing, drying)?: A Little Help from another person to put on and taking off regular upper body clothing?: A Little Help from another person to put on and taking off regular lower body clothing?: A Little 6 Click Score: 19    End of Session Equipment Utilized During Treatment: Rolling walker  OT Visit Diagnosis: Unsteadiness on feet (R26.81);Other abnormalities of gait and mobility (R26.89);Muscle weakness (generalized) (M62.81)   Activity Tolerance Patient tolerated treatment well   Patient Left with call bell/phone within reach;Other (comment);with bed alarm set (seated EOB )   Nurse Communication Mobility status        Time: 0940-7680 OT Time Calculation (min): 15 min  Charges: OT General Charges $OT Visit: 1 Visit OT Treatments $Self Care/Home Management : 8-22 mins  Jolaine Artist, OT Acute Rehabilitation Services Pager [phone] Office [phone]    [name] 08/23/2020, 12:49 PM

## 2020-08-24 DIAGNOSIS — M25562 Pain in left knee: Secondary | ICD-10-CM | POA: Diagnosis not present

## 2020-08-24 DIAGNOSIS — G4489 Other headache syndrome: Secondary | ICD-10-CM | POA: Diagnosis not present

## 2020-08-24 DIAGNOSIS — M6259 Muscle wasting and atrophy, not elsewhere classified, multiple sites: Secondary | ICD-10-CM | POA: Diagnosis not present

## 2020-08-24 DIAGNOSIS — M2550 Pain in unspecified joint: Secondary | ICD-10-CM | POA: Diagnosis not present

## 2020-08-24 DIAGNOSIS — R262 Difficulty in walking, not elsewhere classified: Secondary | ICD-10-CM | POA: Diagnosis not present

## 2020-08-24 DIAGNOSIS — L03115 Cellulitis of right lower limb: Secondary | ICD-10-CM | POA: Diagnosis not present

## 2020-08-24 DIAGNOSIS — Z7401 Bed confinement status: Secondary | ICD-10-CM | POA: Diagnosis not present

## 2020-08-24 DIAGNOSIS — M47816 Spondylosis without myelopathy or radiculopathy, lumbar region: Secondary | ICD-10-CM | POA: Diagnosis not present

## 2020-08-24 DIAGNOSIS — W19XXXD Unspecified fall, subsequent encounter: Secondary | ICD-10-CM | POA: Diagnosis not present

## 2020-08-24 DIAGNOSIS — M6281 Muscle weakness (generalized): Secondary | ICD-10-CM | POA: Diagnosis not present

## 2020-08-24 DIAGNOSIS — I700 Atherosclerosis of aorta: Secondary | ICD-10-CM | POA: Diagnosis not present

## 2020-08-24 DIAGNOSIS — R2689 Other abnormalities of gait and mobility: Secondary | ICD-10-CM | POA: Diagnosis not present

## 2020-08-24 DIAGNOSIS — L0390 Cellulitis, unspecified: Secondary | ICD-10-CM | POA: Diagnosis not present

## 2020-08-24 DIAGNOSIS — E782 Mixed hyperlipidemia: Secondary | ICD-10-CM | POA: Diagnosis not present

## 2020-08-24 DIAGNOSIS — K219 Gastro-esophageal reflux disease without esophagitis: Secondary | ICD-10-CM | POA: Diagnosis not present

## 2020-08-24 DIAGNOSIS — J302 Other seasonal allergic rhinitis: Secondary | ICD-10-CM | POA: Diagnosis not present

## 2020-08-24 DIAGNOSIS — M1990 Unspecified osteoarthritis, unspecified site: Secondary | ICD-10-CM | POA: Diagnosis not present

## 2020-08-24 DIAGNOSIS — E785 Hyperlipidemia, unspecified: Secondary | ICD-10-CM | POA: Diagnosis not present

## 2020-08-24 DIAGNOSIS — I5032 Chronic diastolic (congestive) heart failure: Secondary | ICD-10-CM | POA: Diagnosis not present

## 2020-08-24 DIAGNOSIS — S3992XD Unspecified injury of lower back, subsequent encounter: Secondary | ICD-10-CM | POA: Diagnosis not present

## 2020-08-24 DIAGNOSIS — Z7409 Other reduced mobility: Secondary | ICD-10-CM | POA: Diagnosis not present

## 2020-08-24 DIAGNOSIS — A419 Sepsis, unspecified organism: Secondary | ICD-10-CM | POA: Diagnosis not present

## 2020-08-24 DIAGNOSIS — I10 Essential (primary) hypertension: Secondary | ICD-10-CM | POA: Diagnosis not present

## 2020-08-24 DIAGNOSIS — Z743 Need for continuous supervision: Secondary | ICD-10-CM | POA: Diagnosis not present

## 2020-08-24 DIAGNOSIS — J3089 Other allergic rhinitis: Secondary | ICD-10-CM | POA: Diagnosis not present

## 2020-08-24 DIAGNOSIS — D509 Iron deficiency anemia, unspecified: Secondary | ICD-10-CM | POA: Diagnosis not present

## 2020-08-24 DIAGNOSIS — H9191 Unspecified hearing loss, right ear: Secondary | ICD-10-CM | POA: Diagnosis not present

## 2020-08-24 DIAGNOSIS — L97119 Non-pressure chronic ulcer of right thigh with unspecified severity: Secondary | ICD-10-CM | POA: Diagnosis not present

## 2020-08-24 DIAGNOSIS — Z741 Need for assistance with personal care: Secondary | ICD-10-CM | POA: Diagnosis not present

## 2020-08-24 DIAGNOSIS — R5381 Other malaise: Secondary | ICD-10-CM | POA: Diagnosis not present

## 2020-08-24 NOTE — Progress Notes (Signed)
Patient discharged and left unit at this time via transport on cot. AVS and medication details reviewed. Patient denies and questions or concerns. Packet given to transport for receiving facility. IV catheter removed, catheter intact. Patient belongings collected and sent with patient.

## 2020-08-28 ENCOUNTER — Telehealth: Payer: Self-pay | Admitting: Internal Medicine

## 2020-08-28 NOTE — Telephone Encounter (Signed)
LVM for pt to rtn my call to schedule AWV with [facility]. If pt calls the office please schedule AWV.

## 2020-08-29 DIAGNOSIS — I10 Essential (primary) hypertension: Secondary | ICD-10-CM | POA: Diagnosis not present

## 2020-08-29 DIAGNOSIS — E785 Hyperlipidemia, unspecified: Secondary | ICD-10-CM | POA: Diagnosis not present

## 2020-08-29 DIAGNOSIS — L97119 Non-pressure chronic ulcer of right thigh with unspecified severity: Secondary | ICD-10-CM | POA: Diagnosis not present

## 2020-08-29 DIAGNOSIS — L03115 Cellulitis of right lower limb: Secondary | ICD-10-CM | POA: Diagnosis not present

## 2020-08-30 DIAGNOSIS — M25562 Pain in left knee: Secondary | ICD-10-CM | POA: Diagnosis not present

## 2020-08-30 DIAGNOSIS — M6281 Muscle weakness (generalized): Secondary | ICD-10-CM | POA: Diagnosis not present

## 2020-08-30 DIAGNOSIS — R262 Difficulty in walking, not elsewhere classified: Secondary | ICD-10-CM | POA: Diagnosis not present

## 2020-08-30 DIAGNOSIS — R5381 Other malaise: Secondary | ICD-10-CM | POA: Diagnosis not present

## 2020-09-03 ENCOUNTER — Ambulatory Visit: Payer: Medicare Other | Admitting: Internal Medicine

## 2020-09-03 DIAGNOSIS — M25562 Pain in left knee: Secondary | ICD-10-CM | POA: Diagnosis not present

## 2020-09-03 DIAGNOSIS — M6281 Muscle weakness (generalized): Secondary | ICD-10-CM | POA: Diagnosis not present

## 2020-09-03 DIAGNOSIS — R262 Difficulty in walking, not elsewhere classified: Secondary | ICD-10-CM | POA: Diagnosis not present

## 2020-09-03 DIAGNOSIS — R5381 Other malaise: Secondary | ICD-10-CM | POA: Diagnosis not present

## 2020-09-05 DIAGNOSIS — L97119 Non-pressure chronic ulcer of right thigh with unspecified severity: Secondary | ICD-10-CM | POA: Diagnosis not present

## 2020-09-06 DIAGNOSIS — L03115 Cellulitis of right lower limb: Secondary | ICD-10-CM | POA: Diagnosis not present

## 2020-09-06 DIAGNOSIS — E782 Mixed hyperlipidemia: Secondary | ICD-10-CM | POA: Diagnosis not present

## 2020-09-06 DIAGNOSIS — S3992XD Unspecified injury of lower back, subsequent encounter: Secondary | ICD-10-CM | POA: Diagnosis not present

## 2020-09-06 DIAGNOSIS — I5032 Chronic diastolic (congestive) heart failure: Secondary | ICD-10-CM | POA: Diagnosis not present

## 2020-09-06 DIAGNOSIS — I10 Essential (primary) hypertension: Secondary | ICD-10-CM | POA: Diagnosis not present

## 2020-09-09 ENCOUNTER — Ambulatory Visit (INDEPENDENT_AMBULATORY_CARE_PROVIDER_SITE_OTHER): Payer: Medicare Other

## 2020-09-09 DIAGNOSIS — Z0000 Encounter for general adult medical examination without abnormal findings: Secondary | ICD-10-CM | POA: Diagnosis not present

## 2020-09-09 DIAGNOSIS — R2689 Other abnormalities of gait and mobility: Secondary | ICD-10-CM | POA: Diagnosis not present

## 2020-09-09 DIAGNOSIS — M6281 Muscle weakness (generalized): Secondary | ICD-10-CM | POA: Diagnosis not present

## 2020-09-09 DIAGNOSIS — M1712 Unilateral primary osteoarthritis, left knee: Secondary | ICD-10-CM | POA: Diagnosis not present

## 2020-09-09 DIAGNOSIS — I110 Hypertensive heart disease with heart failure: Secondary | ICD-10-CM | POA: Diagnosis not present

## 2020-09-09 DIAGNOSIS — M47816 Spondylosis without myelopathy or radiculopathy, lumbar region: Secondary | ICD-10-CM | POA: Diagnosis not present

## 2020-09-09 DIAGNOSIS — I5032 Chronic diastolic (congestive) heart failure: Secondary | ICD-10-CM | POA: Diagnosis not present

## 2020-09-09 DIAGNOSIS — L03115 Cellulitis of right lower limb: Secondary | ICD-10-CM | POA: Diagnosis not present

## 2020-09-09 NOTE — Progress Notes (Signed)
I connected with [name] today by telephone and verified that I am speaking with the correct person using two identifiers. Location patient: home Location provider: work Persons participating in the virtual visit: Catharine Kettlewell and Lisette Abu, LPN.   I discussed the limitations, risks, security and privacy concerns of performing an evaluation and management service by telephone and the availability of in person appointments. I also discussed with the patient that there may be a patient responsible charge related to this service. The patient expressed understanding and verbally consented to this telephonic visit.    Interactive audio and video telecommunications were attempted between this provider and patient, however failed, due to patient having technical difficulties OR patient did not have access to video capability.  We continued and completed visit with audio only.  Some vital signs may be absent or patient reported.   Time Spent with patient on telephone encounter: 30 minutes  Subjective:   [name] is a 74 y.o. female who presents for Medicare Annual (Subsequent) preventive examination.  Review of Systems    No ROS. Medicare Wellness Visit. Additional risk factors are reflected in social history. Cardiac Risk Factors include: dyslipidemia;family history of premature cardiovascular disease;hypertension;obesity (BMI >30kg/m2);advanced age (>84men, >44 women)     Objective:    Today's Vitals   09/09/20 7253  PainSc: 10-Worst pain ever   There is no height or weight on file to calculate BMI.  Advanced Directives 09/09/2020 03/26/2020 04/14/2019 04/12/2019 05/24/2018 02/16/2017 12/20/2015  Does Patient Have a Medical Advance Directive? No No No No No No No  Would patient like information on creating a medical advance directive? No - Patient declined No - Patient declined No - Patient declined No - Patient declined Yes (ED - Information included in AVS) Yes (ED -  Information included in AVS) Yes - Educational materials given    Current Medications (verified) Outpatient Encounter Medications as of 09/09/2020  Medication Sig   atorvastatin (LIPITOR) 20 MG tablet TAKE 1 TABLET BY MOUTH  DAILY   azelastine (ASTELIN) 0.1 % nasal spray Place 1 spray into both nostrils 2 (two) times daily. Use in each nostril as directed (Patient taking differently: Place 1 spray into both nostrils daily as needed for allergies. Use in each nostril as directed )   cetirizine (ZYRTEC) 10 MG tablet Take 1 tablet (10 mg total) by mouth daily. As needed (Patient taking differently: Take 10 mg by mouth daily as needed for allergies. As needed)   DILT-XR 240 MG 24 hr capsule TAKE 1 CAPSULE BY MOUTH  DAILY   DULoxetine (CYMBALTA) 20 MG capsule Take 2 capsules (40 mg total) by mouth daily.   esomeprazole (NEXIUM) 40 MG capsule TAKE 1 CAPSULE BY MOUTH  DAILY   ferrous sulfate 325 (65 FE) MG tablet Take 1 tablet (325 mg total) by mouth 2 (two) times daily with a meal.   furosemide (LASIX) 80 MG tablet Take 1 tablet (80 mg total) by mouth 2 (two) times daily.   gabapentin (NEURONTIN) 100 MG capsule TAKE 2 CAPSULES(200 MG) BY MOUTH AT BEDTIME   hydrOXYzine (ATARAX/VISTARIL) 10 MG tablet Take 1 tablet (10 mg total) by mouth every 8 (eight) hours as needed for itching.   losartan (COZAAR) 100 MG tablet TAKE 1 TABLET BY MOUTH  DAILY   metoprolol tartrate (LOPRESSOR) 50 MG tablet TAKE 1 TABLET BY MOUTH  TWICE DAILY   Polyethyl Glycol-Propyl Glycol (SYSTANE OP) Place 1 drop into both eyes 3 (three) times daily as needed (  dry eyes).   Polyethylene Glycol 3350 (MIRALAX PO) Take 17 g by mouth daily as needed (constipation).    potassium chloride (KLOR-CON) 10 MEQ tablet TAKE 1 TABLET(10 MEQ) BY MOUTH DAILY (Patient taking differently: Take 10 mEq by mouth daily. )   Prednisol Ace-Moxiflox-Bromfen 1-0.5-0.075 % SUSP Place 1 drop into the right eye 4 (four) times daily.    tiZANidine (ZANAFLEX) 2 MG tablet Take 2 mg by mouth every 6 (six) hours as needed for muscle spasms.   traMADol (ULTRAM) 50 MG tablet Take 1-2 tablets (50-100 mg total) by mouth every 6 (six) hours as needed for up to 4 doses for moderate pain.   triamcinolone cream (KENALOG) 0.1 % Apply 1 application topically 2 (two) times daily. (Patient not taking: Reported on 08/12/2020)   vitamin B-12 1000 MCG tablet Take 1 tablet (1,000 mcg total) by mouth daily.   No facility-administered encounter medications on file as of 09/09/2020.    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   Procedure: TOTAL KNEE ARTHROPLASTY;  Surgeon: Dorna Leitz, MD;  Location: [facility];  Service: Orthopedics;  Laterality: Right;   TUBAL LIGATION     Family History  Problem Relation Age of Onset   Heart disease Mother    Hypertension Mother    Diabetes Brother    Breast cancer Sister    Colon cancer Neg Hx    Esophageal cancer Neg Hx    Rectal cancer Neg Hx    Stomach cancer Neg Hx    Social History   Socioeconomic History   Marital status: Widowed    Spouse name: Not on file   Number of children: 3   Years of education: 5   Highest education level: Not on file  Occupational History   Occupation: retired    Fish farm manager: UNEMPLOYED  Tobacco Use   Smoking status: Never Smoker    Smokeless tobacco: Never Used  Scientific laboratory technician Use: Never used  Substance and Sexual Activity   Alcohol use: No    Alcohol/week: 0.0 standard drinks   Drug use: No   Sexual activity: Not Currently    Birth control/protection: Post-menopausal  Other Topics Concern   Not on file  Social History Narrative   Not on file   Social Determinants of Health   Financial Resource Strain: Low Risk    Difficulty of Paying Living Expenses: Not hard at all  Food Insecurity: No Food Insecurity   Worried About Charity fundraiser in the Last Year: Never true   [place] in the Last Year: Never true  Transportation Needs: No Transportation Needs   Lack of Transportation (Medical): No   Lack of Transportation (Non-Medical): No  Physical Activity: Inactive   Days of Exercise per Week: 0 days  Minutes of Exercise per Session: 0 min  Stress: No Stress Concern Present   Feeling of Stress : Not at all  Social Connections: Socially Isolated   Frequency of Communication with Friends and Family: More than three times a week   Frequency of Social Gatherings with Friends and Family: Once a week   Attends Religious Services: Never   Marine scientist or Organizations: No   Attends Music therapist: Not on file   Marital Status: Never married    Tobacco Counseling Counseling given: Not Answered   Clinical Intake:  Pre-visit preparation completed: Yes  Pain : 0-10 Pain Score: 10-Worst pain ever Pain Type: Chronic pain Pain Location: Hip Pain Orientation: Left Pain Radiating Towards: left leg Pain Descriptors / Indicators: Throbbing, Discomfort, Aching Pain Onset: More than a month ago Pain Frequency: Constant Pain Relieving Factors: Tramadol Effect of Pain on Daily Activities: Pain produces disability and affects the quality of life.  Pain Relieving Factors: Tramadol  Nutritional Risks: None Diabetes: No  How often do you need to have  someone help you when you read instructions, pamphlets, or other written materials from your doctor or pharmacy?: 1 - Never What is the last grade level you completed in school?: HSG  Diabetic? no  Interpreter Needed?: No  Information entered by :: Lisette Abu, LPN   Activities of Daily Living In your present state of health, do you have any difficulty performing the following activities: 09/09/2020 08/14/2020  Hearing? N N  Vision? N N  Difficulty concentrating or making decisions? N N  Walking or climbing stairs? Y N  Dressing or bathing? N N  Doing errands, shopping? N -  Preparing Food and eating ? N -  Using the Toilet? N -  In the past six months, have you accidently leaked urine? N -  Do you have problems with loss of bowel control? N -  Managing your Medications? N -  Managing your Finances? N -  Housekeeping or managing your Housekeeping? N -  Some recent data might be hidden    Patient Care Team: Biagio Borg, MD as PCP - General (Internal Medicine)  Indicate any recent Medical Services you may have received from other than [facility] providers in the past year (date may be approximate).     Assessment:   This is a routine wellness examination for [place].  Hearing/Vision screen No exam data present  Dietary issues and exercise activities discussed: Current Exercise Habits: The patient does not participate in regular exercise at present, Exercise limited by: orthopedic condition(s)  Goals     lose 10 pounds     Begin to eat healthy, read food labels, do chair exercises and go to the [facility].     Patient Stated     Continue to eat healthy and do water aerobics at the [facility]. Enjoy life, church and family.     Weight < 185 lb (83.915 kg)     Plan is to eat less "white" rice; pasta;  Given information on an 1800 calorie      Depression Screen PHQ 2/9 Scores 09/09/2020 08/07/2020 02/06/2020 07/20/2019 04/19/2019 05/24/2018 04/13/2018  PHQ - 2 Score 1 1 0  1 0 0 1  PHQ- 9 Score - - - - - 0 -    Fall Risk Fall Risk  09/09/2020 08/07/2020 02/06/2020 07/20/2019 04/19/2019  Falls in the past year? 1 0 0 0 0  Number falls in past yr: 0 0 - - -  Injury with Fall? 1 0 - - -  Comment - - - - -  Risk Factor Category  - - - - -  Risk for fall due to : Impaired balance/gait - - - -  Follow up Falls evaluation completed - - - -    Any stairs in or around the home? No  If so, are there any without handrails? No  Home free of loose throw rugs in walkways, pet beds, electrical cords, etc? Yes  Adequate lighting in your home to reduce risk of falls? Yes   ASSISTIVE DEVICES UTILIZED TO PREVENT FALLS:  Life alert? No  Use of a cane, walker or w/c? Yes  Grab bars in the bathroom? Yes  Shower chair or bench in shower? Yes  Elevated toilet seat or a handicapped toilet? Yes   TIMED UP AND GO:  Was the test performed? No .  Length of time to ambulate 10 feet: 0 sec.   Gait steady and fast with assistive device  Cognitive Function: MMSE - Mini Mental State Exam 05/24/2018 02/16/2017  Orientation to time 5 5  Orientation to Place 5 5  Registration 3 3  Attention/ Calculation 3 3  Recall 2 0  Language- name 2 objects 2 2  Language- repeat 1 1  Language- follow 3 step command 3 3  Language- read & follow direction 1 1  Write a sentence 1 1  Copy design 1 1  Total score 27 25        Immunizations Immunization History  Administered Date(s) Administered   Fluad Quad(high Dose 65+) 07/12/2020   Influenza Split 09/30/2012   Influenza, High Dose Seasonal PF 08/01/2013, 08/11/2016, 07/26/2017, 06/24/2018, 07/11/2019   Influenza,inj,Quad PF,6+ Mos 07/26/2015   Moderna SARS-COVID-2 Vaccination 11/25/2019, 12/18/2019   Pneumococcal Conjugate-13 03/15/2014   Pneumococcal Polysaccharide-23 01/30/2013   Tdap 01/18/1999, 04/13/2018    TDAP status: Up to date Flu Vaccine status: Up to date Pneumococcal vaccine status: Up to date Covid-19  vaccine status: Completed vaccines  Qualifies for Shingles Vaccine? Yes   Zostavax completed No   Shingrix Completed?: No.    Education has been provided regarding the importance of this vaccine. Patient has been advised to call insurance company to determine out of pocket expense if they have not yet received this vaccine. Advised may also receive vaccine at local pharmacy or Health Dept. Verbalized acceptance and understanding.  Screening Tests Health Maintenance  Topic Date Due   COLONOSCOPY  02/05/2021 (Originally 12/11/2019)   MAMMOGRAM  01/08/2022   TETANUS/TDAP  04/13/2028   INFLUENZA VACCINE  Completed   DEXA SCAN  Completed   COVID-19 Vaccine  Completed   Hepatitis C Screening  Completed   PNA vac Low Risk Adult  Completed    Health Maintenance  There are no preventive care reminders to display for this patient.  Colorectal cancer screening: Completed 12/10/2014. Repeat every 5 years Mammogram status: Completed 01/09/2020. Repeat every year Bone Density status: Completed 01/23/2016. Results reflect: Bone density results: NORMAL. Repeat every 5 years.  Lung Cancer Screening: (Low Dose CT Chest recommended if Age 38-80 years, 30 pack-year currently smoking OR have quit w/in 15years.) does not qualify.   Lung Cancer Screening Referral: no  Additional Screening:  Hepatitis C Screening: does qualify; Completed yes  Vision Screening: Recommended annual ophthalmology exams for early detection of glaucoma and other disorders of the eye. Is the patient up to date with their annual eye exam?  Yes  Who is the provider or what  is the name of the office in which the patient attends annual eye exams? Warden Fillers, MD. If pt is not established with a provider, would they like to be referred to a provider to establish care? No .   Dental Screening: Recommended annual dental exams for proper oral hygiene  Community Resource Referral / Chronic Care Management: CRR required  this visit?  No   CCM required this visit?  No      Plan:     I have personally reviewed and noted the following in the patients chart:    Medical and social history  Use of alcohol, tobacco or illicit drugs   Current medications and supplements  Functional ability and status  Nutritional status  Physical activity  Advanced directives  List of other physicians  Hospitalizations, surgeries, and ER visits in previous 12 months  Vitals  Screenings to include cognitive, depression, and falls  Referrals and appointments  In addition, I have reviewed and discussed with patient certain preventive protocols, quality metrics, and best practice recommendations. A written personalized care plan for preventive services as well as general preventive health recommendations were provided to patient.     Sheral Flow, LPN   [id number]   Nurse Notes:  Patient is cogitatively intact. There were no vitals filed for this visit. There is no height or weight on file to calculate BMI.

## 2020-09-09 NOTE — Patient Instructions (Signed)
[name] , Thank you for taking time to come for your Medicare Wellness Visit. I appreciate your ongoing commitment to your health goals. Please review the following plan we discussed and let me know if I can assist you in the future.   Screening recommendations/referrals: Colonoscopy: 12/10/2014; due every 5 years Mammogram: 01/09/2020 Bone Density: 01/23/2016; due every 5 years Recommended yearly ophthalmology/optometry visit for glaucoma screening and checkup Recommended yearly dental visit for hygiene and checkup  Vaccinations: Influenza vaccine: 07/12/2020 Pneumococcal vaccine: 01/30/2013, 03/15/2014 Tdap vaccine: 04/13/2018 Shingles vaccine: declined   Covid-19: 11/25/2019, 12/18/2019  Advanced directives: Advance directive discussed with you today. Even though you declined this today please call our office should you change your mind and we can give you the proper paperwork for you to fill out.  Conditions/risks identified: Yes. Reviewed health maintenance screenings with patient today and relevant education, vaccines, and/or referrals were provided. Continue doing brain stimulating activities (puzzles, reading, adult coloring books, staying active) to keep memory sharp. Continue to eat heart healthy diet (full of fruits, vegetables, whole grains, lean protein, water--limit salt, fat, and sugar intake) and increase physical activity as tolerated.  Next appointment: Please schedule your next Medicare Wellness Visit with your Nurse Health Advisor in 1 year by calling [phone].  Preventive Care 73 Years and Older, Female Preventive care refers to lifestyle choices and visits with your health care provider that can promote health and wellness. What does preventive care include?  A yearly physical exam. This is also called an annual well check.  Dental exams once or twice a year.  Routine eye exams. Ask your health care provider how often you should have your eyes checked.  Personal  lifestyle choices, including:  Daily care of your teeth and gums.  Regular physical activity.  Eating a healthy diet.  Avoiding tobacco and drug use.  Limiting alcohol use.  Practicing safe sex.  Taking low-dose aspirin every day.  Taking vitamin and mineral supplements as recommended by your health care provider. What happens during an annual well check? The services and screenings done by your health care provider during your annual well check will depend on your age, overall health, lifestyle risk factors, and family history of disease. Counseling  Your health care provider may ask you questions about your:  Alcohol use.  Tobacco use.  Drug use.  Emotional well-being.  Home and relationship well-being.  Sexual activity.  Eating habits.  History of falls.  Memory and ability to understand (cognition).  Work and work Statistician.  Reproductive health. Screening  You may have the following tests or measurements:  Height, weight, and BMI.  Blood pressure.  Lipid and cholesterol levels. These may be checked every 5 years, or more frequently if you are over 2 years old.  Skin check.  Lung cancer screening. You may have this screening every year starting at age 28 if you have a 30-pack-year history of smoking and currently smoke or have quit within the past 15 years.  Fecal occult blood test (FOBT) of the stool. You may have this test every year starting at age 50.  Flexible sigmoidoscopy or colonoscopy. You may have a sigmoidoscopy every 5 years or a colonoscopy every 10 years starting at age 26.  Hepatitis C blood test.  Hepatitis B blood test.  Sexually transmitted disease (STD) testing.  Diabetes screening. This is done by checking your blood sugar (glucose) after you have not eaten for a while (fasting). You may have this done every 1-3 years.  Bone density scan. This is done to screen for osteoporosis. You may have this done starting at age  81.  Mammogram. This may be done every 1-2 years. Talk to your health care provider about how often you should have regular mammograms. Talk with your health care provider about your test results, treatment options, and if necessary, the need for more tests. Vaccines  Your health care provider may recommend certain vaccines, such as:  Influenza vaccine. This is recommended every year.  Tetanus, diphtheria, and acellular pertussis (Tdap, Td) vaccine. You may need a Td booster every 10 years.  Zoster vaccine. You may need this after age 73.  Pneumococcal 13-valent conjugate (PCV13) vaccine. One dose is recommended after age 66.  Pneumococcal polysaccharide (PPSV23) vaccine. One dose is recommended after age 45. Talk to your health care provider about which screenings and vaccines you need and how often you need them. This information is not intended to replace advice given to you by your health care provider. Make sure you discuss any questions you have with your health care provider. Document Released: 11/01/2015 Document Revised: 06/24/2016 Document Reviewed: 08/06/2015 Elsevier Interactive Patient Education  2017 [place] Prevention in the Home Falls can cause injuries. They can happen to people of all ages. There are many things you can do to make your home safe and to help prevent falls. What can I do on the outside of my home?  Regularly fix the edges of walkways and driveways and fix any cracks.  Remove anything that might make you trip as you walk through a door, such as a raised step or threshold.  Trim any bushes or trees on the path to your home.  Use bright outdoor lighting.  Clear any walking paths of anything that might make someone trip, such as rocks or tools.  Regularly check to see if handrails are loose or broken. Make sure that both sides of any steps have handrails.  Any raised decks and porches should have guardrails on the edges.  Have any  leaves, snow, or ice cleared regularly.  Use sand or salt on walking paths during winter.  Clean up any spills in your garage right away. This includes oil or grease spills. What can I do in the bathroom?  Use night lights.  Install grab bars by the toilet and in the tub and shower. Do not use towel bars as grab bars.  Use non-skid mats or decals in the tub or shower.  If you need to sit down in the shower, use a plastic, non-slip stool.  Keep the floor dry. Clean up any water that spills on the floor as soon as it happens.  Remove soap buildup in the tub or shower regularly.  Attach bath mats securely with double-sided non-slip rug tape.  Do not have throw rugs and other things on the floor that can make you trip. What can I do in the bedroom?  Use night lights.  Make sure that you have a light by your bed that is easy to reach.  Do not use any sheets or blankets that are too big for your bed. They should not hang down onto the floor.  Have a firm chair that has side arms. You can use this for support while you get dressed.  Do not have throw rugs and other things on the floor that can make you trip. What can I do in the kitchen?  Clean up any spills right away.  Avoid walking  on wet floors.  Keep items that you use a lot in easy-to-reach places.  If you need to reach something above you, use a strong step stool that has a grab bar.  Keep electrical cords out of the way.  Do not use floor polish or wax that makes floors slippery. If you must use wax, use non-skid floor wax.  Do not have throw rugs and other things on the floor that can make you trip. What can I do with my stairs?  Do not leave any items on the stairs.  Make sure that there are handrails on both sides of the stairs and use them. Fix handrails that are broken or loose. Make sure that handrails are as long as the stairways.  Check any carpeting to make sure that it is firmly attached to the stairs.  Fix any carpet that is loose or worn.  Avoid having throw rugs at the top or bottom of the stairs. If you do have throw rugs, attach them to the floor with carpet tape.  Make sure that you have a light switch at the top of the stairs and the bottom of the stairs. If you do not have them, ask someone to add them for you. What else can I do to help prevent falls?  Wear shoes that:  Do not have high heels.  Have rubber bottoms.  Are comfortable and fit you well.  Are closed at the toe. Do not wear sandals.  If you use a stepladder:  Make sure that it is fully opened. Do not climb a closed stepladder.  Make sure that both sides of the stepladder are locked into place.  Ask someone to hold it for you, if possible.  Clearly mark and make sure that you can see:  Any grab bars or handrails.  First and last steps.  Where the edge of each step is.  Use tools that help you move around (mobility aids) if they are needed. These include:  Canes.  Walkers.  Scooters.  Crutches.  Turn on the lights when you go into a dark area. Replace any light bulbs as soon as they burn out.  Set up your furniture so you have a clear path. Avoid moving your furniture around.  If any of your floors are uneven, fix them.  If there are any pets around you, be aware of where they are.  Review your medicines with your doctor. Some medicines can make you feel dizzy. This can increase your chance of falling. Ask your doctor what other things that you can do to help prevent falls. This information is not intended to replace advice given to you by your health care provider. Make sure you discuss any questions you have with your health care provider. Document Released: 08/01/2009 Document Revised: 03/12/2016 Document Reviewed: 11/09/2014 Elsevier Interactive Patient Education  2017 [employer].

## 2020-09-10 ENCOUNTER — Telehealth: Payer: Self-pay | Admitting: Internal Medicine

## 2020-09-10 DIAGNOSIS — I110 Hypertensive heart disease with heart failure: Secondary | ICD-10-CM | POA: Diagnosis not present

## 2020-09-10 DIAGNOSIS — M6281 Muscle weakness (generalized): Secondary | ICD-10-CM | POA: Diagnosis not present

## 2020-09-10 DIAGNOSIS — M1712 Unilateral primary osteoarthritis, left knee: Secondary | ICD-10-CM | POA: Diagnosis not present

## 2020-09-10 DIAGNOSIS — M47816 Spondylosis without myelopathy or radiculopathy, lumbar region: Secondary | ICD-10-CM | POA: Diagnosis not present

## 2020-09-10 DIAGNOSIS — R2689 Other abnormalities of gait and mobility: Secondary | ICD-10-CM | POA: Diagnosis not present

## 2020-09-10 DIAGNOSIS — L03115 Cellulitis of right lower limb: Secondary | ICD-10-CM | POA: Diagnosis not present

## 2020-09-10 DIAGNOSIS — I5032 Chronic diastolic (congestive) heart failure: Secondary | ICD-10-CM | POA: Diagnosis not present

## 2020-09-10 NOTE — Telephone Encounter (Signed)
   [place] Name: [place] Agency Name: [facility] Callback Phone #: [phone], OK TO LEAVE A MESSAGE Service Requested: OT Frequency of Visits: 2X WK FOR 3 WEEKS

## 2020-09-11 NOTE — Telephone Encounter (Signed)
Ok for verbal if this is ok 

## 2020-09-16 DIAGNOSIS — Z6837 Body mass index (BMI) 37.0-37.9, adult: Secondary | ICD-10-CM

## 2020-09-16 DIAGNOSIS — M47816 Spondylosis without myelopathy or radiculopathy, lumbar region: Secondary | ICD-10-CM | POA: Diagnosis not present

## 2020-09-16 DIAGNOSIS — L03115 Cellulitis of right lower limb: Secondary | ICD-10-CM | POA: Diagnosis not present

## 2020-09-16 DIAGNOSIS — I5032 Chronic diastolic (congestive) heart failure: Secondary | ICD-10-CM | POA: Diagnosis not present

## 2020-09-16 DIAGNOSIS — M1712 Unilateral primary osteoarthritis, left knee: Secondary | ICD-10-CM

## 2020-09-16 DIAGNOSIS — I110 Hypertensive heart disease with heart failure: Secondary | ICD-10-CM | POA: Diagnosis not present

## 2020-09-16 DIAGNOSIS — E669 Obesity, unspecified: Secondary | ICD-10-CM

## 2020-09-16 DIAGNOSIS — M6281 Muscle weakness (generalized): Secondary | ICD-10-CM

## 2020-09-16 DIAGNOSIS — R2689 Other abnormalities of gait and mobility: Secondary | ICD-10-CM

## 2020-09-16 DIAGNOSIS — R41841 Cognitive communication deficit: Secondary | ICD-10-CM

## 2020-09-16 NOTE — Telephone Encounter (Signed)
LDVM for Will of verbal OK for [place]

## 2020-09-17 DIAGNOSIS — M1712 Unilateral primary osteoarthritis, left knee: Secondary | ICD-10-CM | POA: Diagnosis not present

## 2020-09-17 DIAGNOSIS — I110 Hypertensive heart disease with heart failure: Secondary | ICD-10-CM | POA: Diagnosis not present

## 2020-09-17 DIAGNOSIS — L03115 Cellulitis of right lower limb: Secondary | ICD-10-CM | POA: Diagnosis not present

## 2020-09-17 DIAGNOSIS — M47816 Spondylosis without myelopathy or radiculopathy, lumbar region: Secondary | ICD-10-CM | POA: Diagnosis not present

## 2020-09-17 DIAGNOSIS — R2689 Other abnormalities of gait and mobility: Secondary | ICD-10-CM | POA: Diagnosis not present

## 2020-09-17 DIAGNOSIS — I5032 Chronic diastolic (congestive) heart failure: Secondary | ICD-10-CM | POA: Diagnosis not present

## 2020-09-17 DIAGNOSIS — M6281 Muscle weakness (generalized): Secondary | ICD-10-CM | POA: Diagnosis not present

## 2020-09-17 NOTE — Telephone Encounter (Signed)
Patient called to see if she needed to make a follow-up appointment since she left the hospital 2 weeks ago.  Patient states she is still receiving daily [place] care, and would prefer to make an appointment after that time if needed.  Patient  Is aware that she has an appointment in April 2022.

## 2020-09-18 ENCOUNTER — Telehealth: Payer: Self-pay | Admitting: Internal Medicine

## 2020-09-18 DIAGNOSIS — R2689 Other abnormalities of gait and mobility: Secondary | ICD-10-CM | POA: Diagnosis not present

## 2020-09-18 DIAGNOSIS — M6281 Muscle weakness (generalized): Secondary | ICD-10-CM | POA: Diagnosis not present

## 2020-09-18 DIAGNOSIS — L03115 Cellulitis of right lower limb: Secondary | ICD-10-CM | POA: Diagnosis not present

## 2020-09-18 DIAGNOSIS — M47816 Spondylosis without myelopathy or radiculopathy, lumbar region: Secondary | ICD-10-CM | POA: Diagnosis not present

## 2020-09-18 DIAGNOSIS — M1712 Unilateral primary osteoarthritis, left knee: Secondary | ICD-10-CM | POA: Diagnosis not present

## 2020-09-18 DIAGNOSIS — I110 Hypertensive heart disease with heart failure: Secondary | ICD-10-CM | POA: Diagnosis not present

## 2020-09-18 DIAGNOSIS — I5032 Chronic diastolic (congestive) heart failure: Secondary | ICD-10-CM | POA: Diagnosis not present

## 2020-09-18 NOTE — Telephone Encounter (Signed)
  [place] a speech therapist with [facility] calling to get verbal orders for speech therapy 1 time a week for 3 weeks [place]-  [phone] Ok to LVM

## 2020-09-18 NOTE — Telephone Encounter (Signed)
Ok for verbals 

## 2020-09-18 NOTE — Telephone Encounter (Signed)
Sent to Dr. John. 

## 2020-09-20 ENCOUNTER — Encounter: Payer: Self-pay | Admitting: Internal Medicine

## 2020-09-20 ENCOUNTER — Other Ambulatory Visit: Payer: Self-pay

## 2020-09-20 ENCOUNTER — Ambulatory Visit (INDEPENDENT_AMBULATORY_CARE_PROVIDER_SITE_OTHER): Payer: Medicare Other | Admitting: Internal Medicine

## 2020-09-20 VITALS — BP 130/86 | HR 70 | Temp 98.2°F | Ht 63.0 in

## 2020-09-20 DIAGNOSIS — W19XXXD Unspecified fall, subsequent encounter: Secondary | ICD-10-CM

## 2020-09-20 DIAGNOSIS — I10 Essential (primary) hypertension: Secondary | ICD-10-CM | POA: Diagnosis not present

## 2020-09-20 DIAGNOSIS — E782 Mixed hyperlipidemia: Secondary | ICD-10-CM

## 2020-09-20 DIAGNOSIS — M1712 Unilateral primary osteoarthritis, left knee: Secondary | ICD-10-CM | POA: Diagnosis not present

## 2020-09-20 DIAGNOSIS — I110 Hypertensive heart disease with heart failure: Secondary | ICD-10-CM | POA: Diagnosis not present

## 2020-09-20 DIAGNOSIS — I5032 Chronic diastolic (congestive) heart failure: Secondary | ICD-10-CM | POA: Diagnosis not present

## 2020-09-20 DIAGNOSIS — L03115 Cellulitis of right lower limb: Secondary | ICD-10-CM

## 2020-09-20 DIAGNOSIS — D509 Iron deficiency anemia, unspecified: Secondary | ICD-10-CM | POA: Diagnosis not present

## 2020-09-20 DIAGNOSIS — S3992XD Unspecified injury of lower back, subsequent encounter: Secondary | ICD-10-CM

## 2020-09-20 DIAGNOSIS — R739 Hyperglycemia, unspecified: Secondary | ICD-10-CM | POA: Diagnosis not present

## 2020-09-20 DIAGNOSIS — M47816 Spondylosis without myelopathy or radiculopathy, lumbar region: Secondary | ICD-10-CM | POA: Diagnosis not present

## 2020-09-20 DIAGNOSIS — R2689 Other abnormalities of gait and mobility: Secondary | ICD-10-CM | POA: Diagnosis not present

## 2020-09-20 DIAGNOSIS — M6281 Muscle weakness (generalized): Secondary | ICD-10-CM | POA: Diagnosis not present

## 2020-09-20 NOTE — Progress Notes (Signed)
Subjective:    Patient ID: [name], female    DOB: 06-Oct-1946, 74 y.o.   MRN: [id number]  HPI   Here to f/u [facility] oct 25 - nov 5 presented to [name] ED with headache, lightheadedness and 1 to weeks history of blister on the anterior surface of her right leg that had burst and was having drainage in the area along with redness and swelling in the surrounding tissue.  In the ED she was found to meet criteria for severe sepsis with fever, tachycardia, tachypnea, lactic acidosis along with right lower extremity cellulitis. CTLEon 10/30 showed subcutaneous edema within the right thigh and foreleg,no discrete drainable fluid collection identified. Placed on broad-spectrum antibiotics and was admitted.  Patient was on vancomycin after adding cefepime started to respond. Seen by infectious disease-antibiotic and escalated to Ancef.  Pt sepsis resolved, cellulitis resolved, and pt to continue f/u with dermatology with bullous skin lesion. Pt also noted severe anemia microcyticas low as 6.8 g. Received 2 units PRBC. Hb has been stable in 8 g range. No black or tarry stool, FOBT Was negative 08/13/20.Previously lower GI bleed in 2016 and had endoscopy/capsule endoscopy and colonoscopy positive for diverticulosis and hemorrhoids pathology negative for malignancy.  AKI resolved with IVF.  Had PT with back pain after recent fall at home.  CHF, GERD HLD remained stable.  Pt able for dc to skilled nursing for 2 wks, then home for last several days.  Denies worsening reflux, abd pain, dysphagia, n/v, bowel change or blood.   Pt denies fever, wt loss, night sweats, loss of appetite, or other constitutional symptoms  Pt denies chest pain, increased sob or doe, wheezing, orthopnea, PND, increased LE swelling, palpitations, dizziness or syncope.  No new complaints.  Code status DNR.  Recommended for f/u here with f/u labs.   Past Medical History:  Diagnosis Date  . Allergic rhinitis, cause  unspecified   . Allergy    SEASONAL  . Anemia   . Cataract    Bilateral  . Chronic headaches   . Constipation   . Diverticulosis   . DJD (degenerative joint disease), lumbar   . GERD (gastroesophageal reflux disease)   . Hemorrhoids   . Hiatal hernia   . HTN (hypertension)   . Hyperlipidemia   . Obesity   . Osteoarthritis   . Tubular adenoma of colon    Past Surgical History:  Procedure Laterality Date  . ABLATION    . COLONOSCOPY    . 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 TUBAL LIGATION      reports that she has never smoked. She has never used smokeless tobacco. She reports that she does not drink alcohol and does not use drugs. family history includes Breast cancer in her sister; Diabetes in her brother; Heart disease in her mother; Hypertension in her mother. Allergies  Allergen Reactions  . 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 Sulfonamide Derivatives Itching and Rash   Current Outpatient Medications on File Prior to Visit  Medication Sig Dispense Refill  . atorvastatin (LIPITOR) 20 MG tablet TAKE 1 TABLET BY MOUTH  DAILY 90 tablet 3  . azelastine (ASTELIN) 0.1 % nasal spray Place 1 spray into both nostrils 2 (two) times daily. Use in each nostril as  directed (Patient taking differently: Place 1 spray into both nostrils daily as needed for allergies. Use in each nostril as directed ) 90 mL 4  . cetirizine (ZYRTEC) 10 MG tablet Take 1 tablet (10 mg total) by mouth daily. As needed (Patient taking differently: Take 10 mg by mouth daily as needed for allergies. As needed) 90 tablet 3  . DILT-XR 240 MG 24 hr capsule TAKE 1 CAPSULE BY MOUTH  DAILY 90 capsule 3  . DULoxetine (CYMBALTA) 20 MG capsule Take 2 capsules (40 mg total) by mouth daily. 180 capsule 3  .  esomeprazole (NEXIUM) 40 MG capsule TAKE 1 CAPSULE BY MOUTH  DAILY 90 capsule 3  . ferrous sulfate 325 (65 FE) MG tablet Take 1 tablet (325 mg total) by mouth 2 (two) times daily with a meal.  3  . furosemide (LASIX) 80 MG tablet Take 1 tablet (80 mg total) by mouth 2 (two) times daily. 180 tablet 3  . gabapentin (NEURONTIN) 100 MG capsule TAKE 2 CAPSULES(200 MG) BY MOUTH AT BEDTIME 60 capsule 3  . hydrOXYzine (ATARAX/VISTARIL) 10 MG tablet Take 1 tablet (10 mg total) by mouth every 8 (eight) hours as needed for itching. 60 tablet 2  . losartan (COZAAR) 100 MG tablet TAKE 1 TABLET BY MOUTH  DAILY 90 tablet 2  . metoprolol tartrate (LOPRESSOR) 50 MG tablet TAKE 1 TABLET BY MOUTH  TWICE DAILY 180 tablet 3  . Polyethyl Glycol-Propyl Glycol (SYSTANE OP) Place 1 drop into both eyes 3 (three) times daily as needed (dry eyes).    . Polyethylene Glycol 3350 (MIRALAX PO) Take 17 g by mouth daily as needed (constipation).     . potassium chloride (KLOR-CON) 10 MEQ tablet TAKE 1 TABLET(10 MEQ) BY MOUTH DAILY (Patient taking differently: Take 10 mEq by mouth daily. ) 90 tablet 3  . Prednisol Ace-Moxiflox-Bromfen 1-0.5-0.075 % SUSP Place 1 drop into the right eye 4 (four) times daily.    Marland Kitchen tiZANidine (ZANAFLEX) 2 MG tablet Take 2 mg by mouth every 6 (six) hours as needed for muscle spasms.    . traMADol (ULTRAM) 50 MG tablet Take 1-2 tablets (50-100 mg total) by mouth every 6 (six) hours as needed for up to 4 doses for moderate pain. 4 tablet 0  . triamcinolone cream (KENALOG) 0.1 % Apply 1 application topically 2 (two) times daily. 453 g 0  . vitamin B-12 1000 MCG tablet Take 1 tablet (1,000 mcg total) by mouth daily. 30 tablet 0   No current facility-administered medications on file prior to visit.  Transitional Care Management elements noted today: 1)  Date of D/C: as above 2)  Medication reconciliation:  done today at end visit 3)  Review of D/C summary or other information:  done today 4)  Review of  need for f/u on pending diagnostic tests and treatments:  done today 5)  Review of need for Interaction with other providers who will assume or resume care of pt specific problems: done today 6)  Education of patient/family/guardian or caregiver: done today 7)  Assess for Establishment or Re-establishment of referrals and arranging for needed community resources:  done today 8)  Assess for Assistance in scheduling any required follow up with community providers and services:  done today Review of Systems All otherwise neg per pt     Objective:   Physical Exam BP 130/86 (BP Location: Left Arm, Patient Position: Sitting, Cuff Size: Large)   Pulse 70   Temp 98.2 F (36.8 C) (Oral)  Ht 5\' 3"  (1.6 m)   SpO2 91%   BMI 38.43 kg/m  VS noted,  Constitutional: Pt appears in NAD HENT: Head: NCAT.  Right Ear: External ear normal.  Left Ear: External ear normal.  Eyes: . Pupils are equal, round, and reactive to light. Conjunctivae and EOM are normal Nose: without d/c or deformity Neck: Neck supple. Gross normal ROM Cardiovascular: Normal rate and regular rhythm.   Pulmonary/Chest: Effort normal and breath sounds without rales or wheezing.  Abd:  Soft, NT, ND, + BS, no organomegaly Neurological: Pt is alert. At baseline orientation, motor grossly intact Skin: Skin is warm. No rashes, other new lesions, no LE edema Psychiatric: Pt behavior is normal without agitation  All otherwise neg per pt Lab Results  Component Value Date   WBC 12.3 (H) 09/20/2020   HGB 11.0 (L) 09/20/2020   HCT 34.9 (L) 09/20/2020   PLT 409 (H) 09/20/2020   GLUCOSE 90 09/20/2020   CHOL 145 08/07/2020   TRIG 102.0 08/07/2020   HDL 60.90 08/07/2020   LDLDIRECT 168.8 01/20/2012   LDLCALC 63 08/07/2020   ALT 10 09/20/2020   AST 15 09/20/2020   NA 139 09/20/2020   K 4.6 09/20/2020   CL 98 09/20/2020   CREATININE 1.04 (H) 09/20/2020   BUN 36 (H) 09/20/2020   CO2 31 09/20/2020   TSH 0.96 02/06/2020   INR 1.2  08/13/2020   HGBA1C 6.8 (H) 08/07/2020      Assessment & Plan:

## 2020-09-20 NOTE — Patient Instructions (Signed)

## 2020-09-21 ENCOUNTER — Encounter: Payer: Self-pay | Admitting: Internal Medicine

## 2020-09-21 LAB — HEPATIC FUNCTION PANEL
AG Ratio: 1.3 (calc) (ref 1.0–2.5)
ALT: 10 U/L (ref 6–29)
AST: 15 U/L (ref 10–35)
Albumin: 4.1 g/dL (ref 3.6–5.1)
Alkaline phosphatase (APISO): 116 U/L (ref 37–153)
Bilirubin, Direct: 0.1 mg/dL (ref 0.0–0.2)
Globulin: 3.1 g/dL (calc) (ref 1.9–3.7)
Indirect Bilirubin: 0.4 mg/dL (calc) (ref 0.2–1.2)
Total Bilirubin: 0.5 mg/dL (ref 0.2–1.2)
Total Protein: 7.2 g/dL (ref 6.1–8.1)

## 2020-09-21 LAB — CBC WITH DIFFERENTIAL/PLATELET
Absolute Monocytes: 1267 cells/uL — ABNORMAL HIGH (ref 200–950)
Basophils Absolute: 86 cells/uL (ref 0–200)
Basophils Relative: 0.7 %
Eosinophils Absolute: 800 cells/uL — ABNORMAL HIGH (ref 15–500)
Eosinophils Relative: 6.5 %
HCT: 34.9 % — ABNORMAL LOW (ref 35.0–45.0)
Hemoglobin: 11 g/dL — ABNORMAL LOW (ref 11.7–15.5)
Lymphs Abs: 2768 cells/uL (ref 850–3900)
MCH: 25.2 pg — ABNORMAL LOW (ref 27.0–33.0)
MCHC: 31.5 g/dL — ABNORMAL LOW (ref 32.0–36.0)
MCV: 80 fL (ref 80.0–100.0)
MPV: 9.7 fL (ref 7.5–12.5)
Monocytes Relative: 10.3 %
Neutro Abs: 7380 cells/uL (ref 1500–7800)
Neutrophils Relative %: 60 %
Platelets: 409 10*3/uL — ABNORMAL HIGH (ref 140–400)
RBC: 4.36 10*6/uL (ref 3.80–5.10)
RDW: 22.5 % — ABNORMAL HIGH (ref 11.0–15.0)
Total Lymphocyte: 22.5 %
WBC: 12.3 10*3/uL — ABNORMAL HIGH (ref 3.8–10.8)

## 2020-09-21 LAB — BASIC METABOLIC PANEL
BUN/Creatinine Ratio: 35 (calc) — ABNORMAL HIGH (ref 6–22)
BUN: 36 mg/dL — ABNORMAL HIGH (ref 7–25)
CO2: 31 mmol/L (ref 20–32)
Calcium: 10.2 mg/dL (ref 8.6–10.4)
Chloride: 98 mmol/L (ref 98–110)
Creat: 1.04 mg/dL — ABNORMAL HIGH (ref 0.60–0.93)
Glucose, Bld: 90 mg/dL (ref 65–99)
Potassium: 4.6 mmol/L (ref 3.5–5.3)
Sodium: 139 mmol/L (ref 135–146)

## 2020-09-21 NOTE — Assessment & Plan Note (Addendum)
For f/u lab, and f/u GI declined per pt

## 2020-09-21 NOTE — Assessment & Plan Note (Signed)
S/p PT, improved, cont to follow

## 2020-09-21 NOTE — Assessment & Plan Note (Signed)
stable overall by history and exam, recent data reviewed with pt, and pt to continue medical treatment as before,  to f/u any worsening symptoms or concerns  

## 2020-09-21 NOTE — Assessment & Plan Note (Signed)
Resolved, to f/u derm as planned with bullous lesions

## 2020-09-23 DIAGNOSIS — R2689 Other abnormalities of gait and mobility: Secondary | ICD-10-CM | POA: Diagnosis not present

## 2020-09-23 DIAGNOSIS — M47816 Spondylosis without myelopathy or radiculopathy, lumbar region: Secondary | ICD-10-CM | POA: Diagnosis not present

## 2020-09-23 DIAGNOSIS — L03115 Cellulitis of right lower limb: Secondary | ICD-10-CM | POA: Diagnosis not present

## 2020-09-23 DIAGNOSIS — M1712 Unilateral primary osteoarthritis, left knee: Secondary | ICD-10-CM | POA: Diagnosis not present

## 2020-09-23 DIAGNOSIS — I110 Hypertensive heart disease with heart failure: Secondary | ICD-10-CM | POA: Diagnosis not present

## 2020-09-23 DIAGNOSIS — I5032 Chronic diastolic (congestive) heart failure: Secondary | ICD-10-CM | POA: Diagnosis not present

## 2020-09-23 DIAGNOSIS — M6281 Muscle weakness (generalized): Secondary | ICD-10-CM | POA: Diagnosis not present

## 2020-09-23 NOTE — Telephone Encounter (Signed)
LDVM verbal ok for orders of speech therapy 1 time a week for 3 weeks.

## 2020-09-24 DIAGNOSIS — I5032 Chronic diastolic (congestive) heart failure: Secondary | ICD-10-CM | POA: Diagnosis not present

## 2020-09-24 DIAGNOSIS — M1712 Unilateral primary osteoarthritis, left knee: Secondary | ICD-10-CM | POA: Diagnosis not present

## 2020-09-24 DIAGNOSIS — L03115 Cellulitis of right lower limb: Secondary | ICD-10-CM | POA: Diagnosis not present

## 2020-09-24 DIAGNOSIS — M6281 Muscle weakness (generalized): Secondary | ICD-10-CM | POA: Diagnosis not present

## 2020-09-24 DIAGNOSIS — M47816 Spondylosis without myelopathy or radiculopathy, lumbar region: Secondary | ICD-10-CM | POA: Diagnosis not present

## 2020-09-24 DIAGNOSIS — I110 Hypertensive heart disease with heart failure: Secondary | ICD-10-CM | POA: Diagnosis not present

## 2020-09-24 DIAGNOSIS — R2689 Other abnormalities of gait and mobility: Secondary | ICD-10-CM | POA: Diagnosis not present

## 2020-09-25 ENCOUNTER — Telehealth: Payer: Self-pay | Admitting: Internal Medicine

## 2020-09-25 DIAGNOSIS — M1712 Unilateral primary osteoarthritis, left knee: Secondary | ICD-10-CM | POA: Diagnosis not present

## 2020-09-25 DIAGNOSIS — L989 Disorder of the skin and subcutaneous tissue, unspecified: Secondary | ICD-10-CM

## 2020-09-25 DIAGNOSIS — L03115 Cellulitis of right lower limb: Secondary | ICD-10-CM | POA: Diagnosis not present

## 2020-09-25 DIAGNOSIS — M47816 Spondylosis without myelopathy or radiculopathy, lumbar region: Secondary | ICD-10-CM | POA: Diagnosis not present

## 2020-09-25 DIAGNOSIS — M6281 Muscle weakness (generalized): Secondary | ICD-10-CM | POA: Diagnosis not present

## 2020-09-25 DIAGNOSIS — I5032 Chronic diastolic (congestive) heart failure: Secondary | ICD-10-CM | POA: Diagnosis not present

## 2020-09-25 DIAGNOSIS — I110 Hypertensive heart disease with heart failure: Secondary | ICD-10-CM | POA: Diagnosis not present

## 2020-09-25 DIAGNOSIS — R2689 Other abnormalities of gait and mobility: Secondary | ICD-10-CM | POA: Diagnosis not present

## 2020-09-25 NOTE — Telephone Encounter (Signed)
Patient called and was wondering if the referral for dermatology could be placed again. She can be reached at [phone]

## 2020-09-26 ENCOUNTER — Telehealth: Payer: Self-pay | Admitting: Internal Medicine

## 2020-09-26 ENCOUNTER — Inpatient Hospital Stay: Payer: Medicare Other | Admitting: Internal Medicine

## 2020-09-26 ENCOUNTER — Other Ambulatory Visit: Payer: Self-pay | Admitting: Internal Medicine

## 2020-09-26 DIAGNOSIS — M1712 Unilateral primary osteoarthritis, left knee: Secondary | ICD-10-CM | POA: Diagnosis not present

## 2020-09-26 DIAGNOSIS — M6281 Muscle weakness (generalized): Secondary | ICD-10-CM | POA: Diagnosis not present

## 2020-09-26 DIAGNOSIS — I5032 Chronic diastolic (congestive) heart failure: Secondary | ICD-10-CM | POA: Diagnosis not present

## 2020-09-26 DIAGNOSIS — I110 Hypertensive heart disease with heart failure: Secondary | ICD-10-CM | POA: Diagnosis not present

## 2020-09-26 DIAGNOSIS — R2689 Other abnormalities of gait and mobility: Secondary | ICD-10-CM | POA: Diagnosis not present

## 2020-09-26 DIAGNOSIS — M47816 Spondylosis without myelopathy or radiculopathy, lumbar region: Secondary | ICD-10-CM | POA: Diagnosis not present

## 2020-09-26 DIAGNOSIS — L03115 Cellulitis of right lower limb: Secondary | ICD-10-CM | POA: Diagnosis not present

## 2020-09-26 NOTE — Telephone Encounter (Signed)
Safari with [facility] called and wanted to let Dr. Jenny Reichmann know that the patients left lower leg is pitting edema plus 2, red, and warm to touch.

## 2020-09-26 NOTE — Telephone Encounter (Signed)
Sent to Dr. John. 

## 2020-09-26 NOTE — Telephone Encounter (Signed)
Ok referral done 

## 2020-09-26 NOTE — Telephone Encounter (Signed)
Needs OV any provider, or UC or ED

## 2020-09-27 NOTE — Telephone Encounter (Signed)
Spoke with pt and was able to inform her of Dr. Gwynn Burly advise along informing her that I was able to get her in with a provider on Monday. I was also able to inform the pt that she is to go to the UC or ED if she is getting worse or has any complication's before Monday's Appointment.

## 2020-09-30 ENCOUNTER — Other Ambulatory Visit: Payer: Self-pay

## 2020-09-30 ENCOUNTER — Ambulatory Visit (INDEPENDENT_AMBULATORY_CARE_PROVIDER_SITE_OTHER): Payer: Medicare Other | Admitting: Internal Medicine

## 2020-09-30 ENCOUNTER — Encounter: Payer: Self-pay | Admitting: Internal Medicine

## 2020-09-30 DIAGNOSIS — L03115 Cellulitis of right lower limb: Secondary | ICD-10-CM | POA: Diagnosis not present

## 2020-09-30 DIAGNOSIS — L03116 Cellulitis of left lower limb: Secondary | ICD-10-CM | POA: Diagnosis not present

## 2020-09-30 DIAGNOSIS — I5032 Chronic diastolic (congestive) heart failure: Secondary | ICD-10-CM | POA: Diagnosis not present

## 2020-09-30 DIAGNOSIS — M6281 Muscle weakness (generalized): Secondary | ICD-10-CM | POA: Diagnosis not present

## 2020-09-30 DIAGNOSIS — I110 Hypertensive heart disease with heart failure: Secondary | ICD-10-CM | POA: Diagnosis not present

## 2020-09-30 DIAGNOSIS — R2689 Other abnormalities of gait and mobility: Secondary | ICD-10-CM | POA: Diagnosis not present

## 2020-09-30 DIAGNOSIS — M47816 Spondylosis without myelopathy or radiculopathy, lumbar region: Secondary | ICD-10-CM | POA: Diagnosis not present

## 2020-09-30 DIAGNOSIS — M1712 Unilateral primary osteoarthritis, left knee: Secondary | ICD-10-CM | POA: Diagnosis not present

## 2020-09-30 MED ORDER — CEPHALEXIN 500 MG PO CAPS: 500.0000 mg | ORAL_CAPSULE | Freq: Three times a day (TID) | ORAL | 0 refills | Status: AC

## 2020-09-30 NOTE — Progress Notes (Signed)
   Subjective:   Patient ID: [name], female    DOB: Apr 22, 1946, 74 y.o.   MRN: [id number]  HPI The patient is a 74 YO female coming in for concerns about left cellulitis on her leg. She was in hospital end of October with cellulitis and IV antibiotics given. Discharged to rehab with keflex. She did have significant improvement. Leg started to get red and swollen again on Friday. Denies fevers or chills. Some pain in the leg. Denies injury or overuse. Some swelling was still present even before Friday. Denies trying anything for this.   Review of Systems  Constitutional: Negative.   HENT: Negative.   Eyes: Negative.   Respiratory: Negative for cough, chest tightness and shortness of breath.   Cardiovascular: Positive for leg swelling. Negative for chest pain and palpitations.  Gastrointestinal: Negative for abdominal distention, abdominal pain, constipation, diarrhea, nausea and vomiting.  Musculoskeletal: Negative.   Skin: Positive for color change and rash.  Neurological: Negative.   Psychiatric/Behavioral: Negative.     Objective:  Physical Exam Constitutional:      Appearance: She is well-developed and well-nourished.  HENT:     Head: Normocephalic and atraumatic.  Eyes:     Extraocular Movements: EOM normal.  Cardiovascular:     Rate and Rhythm: Normal rate and regular rhythm.  Pulmonary:     Effort: Pulmonary effort is normal. No respiratory distress.     Breath sounds: Normal breath sounds. No wheezing or rales.  Abdominal:     General: Bowel sounds are normal. There is no distension.     Palpations: Abdomen is soft.     Tenderness: There is no abdominal tenderness. There is no rebound.  Musculoskeletal:     Cervical back: Normal range of motion.     Left lower leg: Edema present.     Comments: There is tight edema to below the knee left leg with redness and heat. Right leg with some edema not tight and dry skin without the redness or heat  Skin:     General: Skin is warm and dry.  Neurological:     Mental Status: She is alert and oriented to person, place, and time.     Coordination: Coordination abnormal.     Comments: Wheeled walker for ambulation  Psychiatric:        Mood and Affect: Mood and affect normal.     Vitals:   09/30/20 1437  BP: 120/76  Pulse: 61  Temp: 98.2 F (36.8 C)  TempSrc: Oral  SpO2: 99%  Weight: 213 lb 12.8 oz (97 kg)  Height: 5\' 3"  (1.6 m)    This visit occurred during the SARS-CoV-2 public health emergency.  Safety protocols were in place, including screening questions prior to the visit, additional usage of staff PPE, and extensive cleaning of exam room while observing appropriate contact time as indicated for disinfecting solutions.   Assessment & Plan:

## 2020-09-30 NOTE — Patient Instructions (Signed)
We have sent in keflex to take for the leg infection to take 1 pill 3 times a day for 10 days.  Come back and see Dr. Jenny Reichmann in about 1 week to make sure it is improving.

## 2020-09-30 NOTE — Assessment & Plan Note (Signed)
Rx keflex 10 day course. Recommended follow up PCP 1 week to ensure healing. Call sooner if not improving or worsening.

## 2020-10-01 DIAGNOSIS — M6281 Muscle weakness (generalized): Secondary | ICD-10-CM | POA: Diagnosis not present

## 2020-10-01 DIAGNOSIS — I110 Hypertensive heart disease with heart failure: Secondary | ICD-10-CM | POA: Diagnosis not present

## 2020-10-01 DIAGNOSIS — R2689 Other abnormalities of gait and mobility: Secondary | ICD-10-CM | POA: Diagnosis not present

## 2020-10-01 DIAGNOSIS — M47816 Spondylosis without myelopathy or radiculopathy, lumbar region: Secondary | ICD-10-CM | POA: Diagnosis not present

## 2020-10-01 DIAGNOSIS — I5032 Chronic diastolic (congestive) heart failure: Secondary | ICD-10-CM | POA: Diagnosis not present

## 2020-10-01 DIAGNOSIS — M1712 Unilateral primary osteoarthritis, left knee: Secondary | ICD-10-CM | POA: Diagnosis not present

## 2020-10-01 DIAGNOSIS — L03115 Cellulitis of right lower limb: Secondary | ICD-10-CM | POA: Diagnosis not present

## 2020-10-02 DIAGNOSIS — M47816 Spondylosis without myelopathy or radiculopathy, lumbar region: Secondary | ICD-10-CM | POA: Diagnosis not present

## 2020-10-02 DIAGNOSIS — L03115 Cellulitis of right lower limb: Secondary | ICD-10-CM | POA: Diagnosis not present

## 2020-10-02 DIAGNOSIS — M6281 Muscle weakness (generalized): Secondary | ICD-10-CM | POA: Diagnosis not present

## 2020-10-02 DIAGNOSIS — I110 Hypertensive heart disease with heart failure: Secondary | ICD-10-CM | POA: Diagnosis not present

## 2020-10-02 DIAGNOSIS — I5032 Chronic diastolic (congestive) heart failure: Secondary | ICD-10-CM | POA: Diagnosis not present

## 2020-10-02 DIAGNOSIS — R2689 Other abnormalities of gait and mobility: Secondary | ICD-10-CM | POA: Diagnosis not present

## 2020-10-02 DIAGNOSIS — M1712 Unilateral primary osteoarthritis, left knee: Secondary | ICD-10-CM | POA: Diagnosis not present

## 2020-10-07 ENCOUNTER — Ambulatory Visit: Payer: Medicare Other | Admitting: Internal Medicine

## 2020-10-07 ENCOUNTER — Other Ambulatory Visit: Payer: Self-pay | Admitting: Internal Medicine

## 2020-10-08 DIAGNOSIS — R2689 Other abnormalities of gait and mobility: Secondary | ICD-10-CM | POA: Diagnosis not present

## 2020-10-08 DIAGNOSIS — M6281 Muscle weakness (generalized): Secondary | ICD-10-CM | POA: Diagnosis not present

## 2020-10-08 DIAGNOSIS — I5032 Chronic diastolic (congestive) heart failure: Secondary | ICD-10-CM | POA: Diagnosis not present

## 2020-10-08 DIAGNOSIS — L03115 Cellulitis of right lower limb: Secondary | ICD-10-CM | POA: Diagnosis not present

## 2020-10-08 DIAGNOSIS — M47816 Spondylosis without myelopathy or radiculopathy, lumbar region: Secondary | ICD-10-CM | POA: Diagnosis not present

## 2020-10-08 DIAGNOSIS — M1712 Unilateral primary osteoarthritis, left knee: Secondary | ICD-10-CM | POA: Diagnosis not present

## 2020-10-08 DIAGNOSIS — I110 Hypertensive heart disease with heart failure: Secondary | ICD-10-CM | POA: Diagnosis not present

## 2020-10-09 DIAGNOSIS — M1712 Unilateral primary osteoarthritis, left knee: Secondary | ICD-10-CM | POA: Diagnosis not present

## 2020-10-09 DIAGNOSIS — M6281 Muscle weakness (generalized): Secondary | ICD-10-CM | POA: Diagnosis not present

## 2020-10-09 DIAGNOSIS — I110 Hypertensive heart disease with heart failure: Secondary | ICD-10-CM | POA: Diagnosis not present

## 2020-10-09 DIAGNOSIS — I5032 Chronic diastolic (congestive) heart failure: Secondary | ICD-10-CM | POA: Diagnosis not present

## 2020-10-09 DIAGNOSIS — M47816 Spondylosis without myelopathy or radiculopathy, lumbar region: Secondary | ICD-10-CM | POA: Diagnosis not present

## 2020-10-09 DIAGNOSIS — L03115 Cellulitis of right lower limb: Secondary | ICD-10-CM | POA: Diagnosis not present

## 2020-10-09 DIAGNOSIS — R2689 Other abnormalities of gait and mobility: Secondary | ICD-10-CM | POA: Diagnosis not present

## 2020-10-16 DIAGNOSIS — I110 Hypertensive heart disease with heart failure: Secondary | ICD-10-CM | POA: Diagnosis not present

## 2020-10-16 DIAGNOSIS — M1712 Unilateral primary osteoarthritis, left knee: Secondary | ICD-10-CM | POA: Diagnosis not present

## 2020-10-16 DIAGNOSIS — R2689 Other abnormalities of gait and mobility: Secondary | ICD-10-CM | POA: Diagnosis not present

## 2020-10-16 DIAGNOSIS — L03115 Cellulitis of right lower limb: Secondary | ICD-10-CM | POA: Diagnosis not present

## 2020-10-16 DIAGNOSIS — M47816 Spondylosis without myelopathy or radiculopathy, lumbar region: Secondary | ICD-10-CM | POA: Diagnosis not present

## 2020-10-16 DIAGNOSIS — M6281 Muscle weakness (generalized): Secondary | ICD-10-CM | POA: Diagnosis not present

## 2020-10-16 DIAGNOSIS — I5032 Chronic diastolic (congestive) heart failure: Secondary | ICD-10-CM | POA: Diagnosis not present

## 2020-10-21 ENCOUNTER — Ambulatory Visit: Payer: Medicare Other | Admitting: Internal Medicine

## 2020-10-21 ENCOUNTER — Other Ambulatory Visit: Payer: Self-pay

## 2020-10-21 MED ORDER — POTASSIUM CHLORIDE ER 10 MEQ PO TBCR
EXTENDED_RELEASE_TABLET | ORAL | 3 refills | Status: DC
Start: 2020-10-21 — End: 2021-09-18

## 2020-10-21 MED ORDER — LOSARTAN POTASSIUM 100 MG PO TABS: 100.0000 mg | ORAL_TABLET | Freq: Every day | ORAL | 2 refills | Status: DC

## 2020-10-21 MED ORDER — ATORVASTATIN CALCIUM 20 MG PO TABS: 20.0000 mg | ORAL_TABLET | Freq: Every day | ORAL | 3 refills | Status: DC

## 2020-10-21 MED ORDER — FUROSEMIDE 80 MG PO TABS: 80.0000 mg | ORAL_TABLET | Freq: Two times a day (BID) | ORAL | 3 refills | Status: DC

## 2020-10-21 MED ORDER — GABAPENTIN 100 MG PO CAPS: ORAL_CAPSULE | ORAL | 2 refills | Status: DC

## 2020-10-21 MED ORDER — DILTIAZEM HCL ER 240 MG PO CP24
240.0000 mg | ORAL_CAPSULE | Freq: Every day | ORAL | 3 refills | Status: DC
Start: 2020-10-21 — End: 2021-09-18

## 2020-10-24 DIAGNOSIS — M48062 Spinal stenosis, lumbar region with neurogenic claudication: Secondary | ICD-10-CM | POA: Diagnosis not present

## 2020-10-29 DIAGNOSIS — H02423 Myogenic ptosis of bilateral eyelids: Secondary | ICD-10-CM | POA: Diagnosis not present

## 2020-10-29 DIAGNOSIS — H40053 Ocular hypertension, bilateral: Secondary | ICD-10-CM | POA: Diagnosis not present

## 2020-10-29 DIAGNOSIS — H04123 Dry eye syndrome of bilateral lacrimal glands: Secondary | ICD-10-CM | POA: Diagnosis not present

## 2020-10-29 DIAGNOSIS — H0102B Squamous blepharitis left eye, upper and lower eyelids: Secondary | ICD-10-CM | POA: Diagnosis not present

## 2020-10-29 DIAGNOSIS — H2511 Age-related nuclear cataract, right eye: Secondary | ICD-10-CM | POA: Diagnosis not present

## 2020-10-29 DIAGNOSIS — H0102A Squamous blepharitis right eye, upper and lower eyelids: Secondary | ICD-10-CM | POA: Diagnosis not present

## 2020-10-29 DIAGNOSIS — H1045 Other chronic allergic conjunctivitis: Secondary | ICD-10-CM | POA: Diagnosis not present

## 2020-10-29 DIAGNOSIS — Z961 Presence of intraocular lens: Secondary | ICD-10-CM | POA: Diagnosis not present

## 2020-11-14 DIAGNOSIS — M48062 Spinal stenosis, lumbar region with neurogenic claudication: Secondary | ICD-10-CM | POA: Diagnosis not present

## 2020-11-14 DIAGNOSIS — Z79899 Other long term (current) drug therapy: Secondary | ICD-10-CM | POA: Diagnosis not present

## 2020-12-02 DIAGNOSIS — H2511 Age-related nuclear cataract, right eye: Secondary | ICD-10-CM | POA: Diagnosis not present

## 2020-12-04 NOTE — Telephone Encounter (Signed)
Results seen

## 2020-12-05 DIAGNOSIS — H2511 Age-related nuclear cataract, right eye: Secondary | ICD-10-CM | POA: Diagnosis not present

## 2020-12-17 ENCOUNTER — Other Ambulatory Visit: Payer: Self-pay | Admitting: Internal Medicine

## 2020-12-17 DIAGNOSIS — Z1231 Encounter for screening mammogram for malignant neoplasm of breast: Secondary | ICD-10-CM

## 2020-12-24 NOTE — Progress Notes (Deleted)
HPI female never smoker followed for allergic rhinitis, recurrent sinusitis, food allergy Allergy Profile 09/18/2013-total IgE 48. Broadly positive for common allergens including aspergillus.  Food profile positive for peanut, soybean. She reports banana and melons make her throat itch.  -------------------------------------------------------------------------.   3/5/ 21- Virtual Visit via Telephone Note  History of Present Illness: 75 year old female never smoker followed for Allergic rhinitis, recurrent sinusitis, food allergy, GERD, Acute Bilateral lung PE 3/21/ Xarelto,  Zyrtec, Astelin nasal,  Had R TKR last July. Acute bilateral PE without DVT, dx'd 12/18/19 and now on Xarelto f/u PCP.  Residual SOB without new acute events, bleeding, chest or calf pain. Dr Jenny Reichmann managing Xarelto. Had 2 Covax. More than usual nasal congestion as pollen season begins. Not wheezing.  Observations/Objective: ED visit 12/18/19-  Acute R lung PE with hx TKR R in July, 2020. No DVT. Had had intermittent chest tightness and DOE. Outpatient w/u with Ddimer, CTa and leg dopplers, then referred to ER.  Low sPESI score. [place] home on Xarelto   CTa chest 12/18/19- IMPRESSION: 1. Acute bilateral pulmonary emboli, extending to the lobar level. No saddle pulmonary emboli. Top-normal caliber main pulmonary artery. 2. No active pulmonary disease. 3. One vessel coronary atherosclerosis. 4. Aortic Atherosclerosis (ICD10-I70.0).  Assessment and Plan: Allergic Rhinitis- early seasonal exacerb> flonase, antihistamine, azelastine nasal PE- Appropriately on Xarelto, managed by Dr Jenny Reichmann  Follow Up Instructions 1 year  12/24/20- 75 year old female never smoker followed for Allergic rhinitis, recurrent sinusitis, food allergy, GERD, Acute Bilateral lung PE 3/21/ Xarelto, hx Sepsis from leg wound,  -Astelin, Zyrtec,  Lab 112/3/21- EO 800 H Covid vax- Flu vax-   ROS-see HPI   + = positive Constitutional:   No-   weight  loss, night sweats, fevers, chills, fatigue, lassitude. HEENT:   + headaches, no-difficulty swallowing, tooth/dental problems, sore throat,       No-  sneezing, itching, +ear ache, +nasal congestion, post nasal drip,  CV:  No-   chest pain, orthopnea, PND, swelling in lower extremities, anasarca, dizziness, palpitations Resp: No-   shortness of breath with exertion or at rest.              No-productive cough, +non-productive cough,  No- coughing up of blood.              No-   change in color of mucus.  No- wheezing.   Skin: No-   rash or lesions. GI:  No-   heartburn, indigestion, abdominal pain, nausea, vomiting,  GU: . MS:  No-   joint pain or swelling.   Neuro-     nothing unusual Psych:  No- change in mood or affect. No depression or anxiety.  No memory loss.  OBJ- Physical Exam General- Alert, Oriented, Affect-appropriate, Distress- none acute. + Obese Skin- rash-none, lesions- none, excoriation- none Lymphadenopathy- none Head- atraumatic            Eyes- Gross vision intact, PERRLA, conjunctivae and secretions clear            Ears- Hearing, canals-normal, TMs are clear            Nose- clear, no-Septal dev, mucus, polyps, erosion, perforation, looks healthy            Throat- Mallampati III , mucosa clear , drainage- none, tonsils- atrophic, + missing teeth Neck- flexible , trachea midline, no stridor , thyroid nl, carotid no bruit Chest - symmetrical excursion , unlabored           Heart/CV-  RRR , no murmur , no gallop  , no rub, nl s1 s2                           - JVD- none , edema- none, stasis changes- none, varices- none           Lung- clear to P&A, wheeze- none, cough- none, dullness-none, rub- none           Chest wall-  Abd- Br/ Gen/ Rectal- Not done, not indicated Extrem- cyanosis- none, clubbing, none, atrophy- none, strength- nl Neuro- grossly intact to observation

## 2020-12-25 ENCOUNTER — Ambulatory Visit: Payer: Medicare Other | Admitting: Internal Medicine

## 2021-01-08 ENCOUNTER — Ambulatory Visit: Payer: Medicare HMO | Admitting: Internal Medicine

## 2021-01-13 ENCOUNTER — Other Ambulatory Visit: Payer: Self-pay

## 2021-01-14 ENCOUNTER — Ambulatory Visit (INDEPENDENT_AMBULATORY_CARE_PROVIDER_SITE_OTHER): Payer: Medicare HMO | Admitting: Internal Medicine

## 2021-01-14 ENCOUNTER — Encounter: Payer: Self-pay | Admitting: Internal Medicine

## 2021-01-14 VITALS — BP 122/78 | HR 63 | Temp 98.6°F | Ht 63.0 in | Wt 224.0 lb

## 2021-01-14 DIAGNOSIS — L03119 Cellulitis of unspecified part of limb: Secondary | ICD-10-CM

## 2021-01-14 DIAGNOSIS — R739 Hyperglycemia, unspecified: Secondary | ICD-10-CM

## 2021-01-14 DIAGNOSIS — Z0001 Encounter for general adult medical examination with abnormal findings: Secondary | ICD-10-CM

## 2021-01-14 DIAGNOSIS — E538 Deficiency of other specified B group vitamins: Secondary | ICD-10-CM

## 2021-01-14 DIAGNOSIS — I10 Essential (primary) hypertension: Secondary | ICD-10-CM | POA: Diagnosis not present

## 2021-01-14 DIAGNOSIS — I700 Atherosclerosis of aorta: Secondary | ICD-10-CM

## 2021-01-14 DIAGNOSIS — R609 Edema, unspecified: Secondary | ICD-10-CM | POA: Diagnosis not present

## 2021-01-14 DIAGNOSIS — R6889 Other general symptoms and signs: Secondary | ICD-10-CM | POA: Diagnosis not present

## 2021-01-14 DIAGNOSIS — E559 Vitamin D deficiency, unspecified: Secondary | ICD-10-CM

## 2021-01-14 DIAGNOSIS — E782 Mixed hyperlipidemia: Secondary | ICD-10-CM

## 2021-01-14 LAB — LIPID PANEL
Cholesterol: 210 mg/dL — ABNORMAL HIGH (ref 0–200)
HDL: 60.4 mg/dL (ref >39.00)
NonHDL: 149.15
Total CHOL/HDL Ratio: 3
Triglycerides: 228 mg/dL — ABNORMAL HIGH (ref 0.0–149.0)
VLDL: 45.6 mg/dL — ABNORMAL HIGH (ref 0.0–40.0)

## 2021-01-14 LAB — HEMOGLOBIN A1C: Hgb A1c MFr Bld: 7 % — ABNORMAL HIGH (ref 4.6–6.5)

## 2021-01-14 LAB — BASIC METABOLIC PANEL
BUN: 33 mg/dL — ABNORMAL HIGH (ref 6–23)
CO2: 32 mEq/L (ref 19–32)
Calcium: 9.9 mg/dL (ref 8.4–10.5)
Chloride: 99 mEq/L (ref 96–112)
Creatinine, Ser: 1.07 mg/dL (ref 0.40–1.20)
GFR: 51.37 mL/min — ABNORMAL LOW (ref >60.00)
Glucose, Bld: 109 mg/dL — ABNORMAL HIGH (ref 70–99)
Potassium: 4 mEq/L (ref 3.5–5.1)
Sodium: 140 mEq/L (ref 135–145)

## 2021-01-14 LAB — CBC WITH DIFFERENTIAL/PLATELET
Basophils Absolute: 0.1 10*3/uL (ref 0.0–0.1)
Basophils Relative: 0.9 % (ref 0.0–3.0)
Eosinophils Absolute: 0.5 10*3/uL (ref 0.0–0.7)
Eosinophils Relative: 4.9 % (ref 0.0–5.0)
HCT: 38.6 % (ref 36.0–46.0)
Hemoglobin: 12.7 g/dL (ref 12.0–15.0)
Lymphocytes Relative: 23 % (ref 12.0–46.0)
Lymphs Abs: 2.5 10*3/uL (ref 0.7–4.0)
MCHC: 32.9 g/dL (ref 30.0–36.0)
MCV: 85.9 fl (ref 78.0–100.0)
Monocytes Absolute: 0.9 10*3/uL (ref 0.1–1.0)
Monocytes Relative: 8.4 % (ref 3.0–12.0)
Neutro Abs: 6.7 10*3/uL (ref 1.4–7.7)
Neutrophils Relative %: 62.8 % (ref 43.0–77.0)
Platelets: 349 10*3/uL (ref 150.0–400.0)
RBC: 4.49 Mil/uL (ref 3.87–5.11)
RDW: 14.6 % (ref 11.5–15.5)
WBC: 10.7 10*3/uL — ABNORMAL HIGH (ref 4.0–10.5)

## 2021-01-14 LAB — HEPATIC FUNCTION PANEL
ALT: 13 U/L (ref 0–35)
AST: 14 U/L (ref 0–37)
Albumin: 4.2 g/dL (ref 3.5–5.2)
Alkaline Phosphatase: 123 U/L — ABNORMAL HIGH (ref 39–117)
Bilirubin, Direct: 0.1 mg/dL (ref 0.0–0.3)
Total Bilirubin: 0.4 mg/dL (ref 0.2–1.2)
Total Protein: 7.5 g/dL (ref 6.0–8.3)

## 2021-01-14 LAB — VITAMIN B12: Vitamin B-12: >1506 pg/mL — ABNORMAL HIGH (ref 211–911)

## 2021-01-14 LAB — VITAMIN D 25 HYDROXY (VIT D DEFICIENCY, FRACTURES): VITD: 33.98 ng/mL (ref 30.00–100.00)

## 2021-01-14 LAB — BRAIN NATRIURETIC PEPTIDE: Pro B Natriuretic peptide (BNP): 17 pg/mL (ref 0.0–100.0)

## 2021-01-14 LAB — TSH: TSH: 0.57 u[IU]/mL (ref 0.35–4.50)

## 2021-01-14 LAB — LDL CHOLESTEROL, DIRECT: Direct LDL: 93 mg/dL

## 2021-01-14 MED ORDER — HYDROXYZINE HCL 10 MG PO TABS: 10.0000 mg | ORAL_TABLET | Freq: Three times a day (TID) | ORAL | 2 refills | Status: DC | PRN

## 2021-01-14 MED ORDER — DOXYCYCLINE HYCLATE 100 MG PO TABS: 100.0000 mg | ORAL_TABLET | Freq: Two times a day (BID) | ORAL | 0 refills | Status: DC

## 2021-01-14 NOTE — Patient Instructions (Signed)
Please take all new medication as prescribed  - the antibiotic  Please continue all other medications as before, and refills have been done if requested.  Please have the pharmacy call with any other refills you may need.  Please continue your efforts at being more active, low cholesterol diet, and weight control.  You are otherwise up to date with prevention measures today.  Please keep your appointments with your specialists as you may have planned  Please go to the LAB at the blood drawing area for the tests to be done  You will be contacted by phone if any changes need to be made immediately.  Otherwise, you will receive a letter about your results with an explanation, but please check with MyChart first.  Please remember to sign up for MyChart if you have not done so, as this will be important to you in the future with finding out test results, communicating by private email, and scheduling acute appointments online when needed.  Please make an Appointment to return in 6 months, or sooner if needed 

## 2021-01-14 NOTE — Progress Notes (Signed)
Patient ID: [name], female   DOB: [birth year], 75 y.o.   MRN: [id number]         Chief Complaint:: wellness exam and Leg Swelling and Joint Swelling  with redness of the legs       HPI:  [name] is a 75 y.o. female here for wellness exam; has mammogram and colonoscopy for may 2022, o/w up to date with preventive referrals and immunizations                        Also c/o 3-4 days onset bilat leg swelling, redness, discomfort mild to mod but gradaully worsening, without fever, chills, red streaks or drainage, and left > right, unable to keep swelling down with compression stockings as cant get them on, and nothing else makes better or worse.  Pt denies chest pain, increased sob or doe, wheezing, orthopnea, PND, palpitations, dizziness or syncope.  Does have also left knee swelling and discomfort but this seems chronic to her, mild, without giveaways or falls.   Pt denies polydipsia, polyuria, Denies worsening focal neuro s/s.   Pt denies fever, wt loss, night sweats, loss of appetite, or other constitutional symptoms   Wt Readings from Last 3 Encounters:  01/14/21 224 lb (101.6 kg)  09/30/20 213 lb 12.8 oz (97 kg)  08/12/20 216 lb 14.9 oz (98.4 kg)   BP Readings from Last 3 Encounters:  01/14/21 122/78  09/30/20 120/76  09/20/20 130/86   Immunization History  Administered Date(s) Administered  . Fluad Quad(high Dose 65+) 07/12/2020  . Influenza Split 09/30/2012  . Influenza, High Dose Seasonal PF 08/01/2013, 08/11/2016, 07/26/2017, 06/24/2018, 07/11/2019  . Influenza,inj,Quad PF,6+ Mos 07/26/2015  . Moderna Sars-Covid-2 Vaccination 11/25/2019, 12/18/2019, 08/05/2020  . Pneumococcal Conjugate-13 03/15/2014  . Pneumococcal Polysaccharide-23 01/30/2013  . Tdap 01/18/1999, 04/13/2018  There are no preventive care reminders to display for this patient.    Past Medical History:  Diagnosis Date  . Allergic rhinitis, cause unspecified   . Allergy    SEASONAL  .  Anemia   . Cataract    Bilateral  . Chronic headaches   . Constipation   . Diverticulosis   . DJD (degenerative joint disease), lumbar   . GERD (gastroesophageal reflux disease)   . Hemorrhoids   . Hiatal hernia   . HTN (hypertension)   . Hyperlipidemia   . Obesity   . Osteoarthritis   . Tubular adenoma of colon    Past Surgical History:  Procedure Laterality Date  . ABLATION    . COLONOSCOPY    . 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 TUBAL LIGATION      reports that she has never smoked. She has never used smokeless tobacco. She reports that she does not drink alcohol and does not use drugs. family history includes Breast cancer in her sister; Diabetes in her brother; Heart disease in her mother; Hypertension in her mother. Allergies  Allergen Reactions  . Other   . Oxycodone   . 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 Sulfonamide Derivatives Itching and Rash   Current Outpatient Medications on File Prior to Visit  Medication Sig Dispense Refill  . atorvastatin (LIPITOR) 20 MG tablet Take 1 tablet (20 mg total) by mouth daily. [place]  tablet 3  . azelastine (ASTELIN) 0.1 % nasal spray Place 1 spray into both nostrils 2 (two) times daily. Use in each nostril as directed (Patient taking differently: Place 1 spray into both nostrils daily as needed for allergies. Use in each nostril as directed) 90 mL 4  . cetirizine (ZYRTEC) 10 MG tablet Take 1 tablet (10 mg total) by mouth daily. As needed (Patient taking differently: Take 10 mg by mouth daily as needed for allergies. As needed) 90 tablet 3  . diltiazem (DILT-XR) 240 MG 24 hr capsule Take 1 capsule (240 mg total) by mouth daily. 90 capsule 3  . DULoxetine (CYMBALTA) 20 MG capsule TAKE 2 CAPSULES(40 MG) BY MOUTH DAILY 180  capsule 3  . esomeprazole (NEXIUM) 40 MG capsule TAKE 1 CAPSULE BY MOUTH  DAILY 90 capsule 3  . ferrous sulfate 325 (65 FE) MG tablet Take 1 tablet (325 mg total) by mouth 2 (two) times daily with a meal.  3  . furosemide (LASIX) 80 MG tablet Take 1 tablet (80 mg total) by mouth 2 (two) times daily. 180 tablet 3  . gabapentin (NEURONTIN) 100 MG capsule TAKE 2 CAPSULES BY MOUTH AT BEDTIME 180 capsule 2  . losartan (COZAAR) 100 MG tablet Take 1 tablet (100 mg total) by mouth daily. 90 tablet 2  . metoprolol tartrate (LOPRESSOR) 50 MG tablet TAKE 1 TABLET BY MOUTH  TWICE DAILY 180 tablet 3  . Polyethyl Glycol-Propyl Glycol (SYSTANE OP) Place 1 drop into both eyes 3 (three) times daily as needed (dry eyes).    . Polyethylene Glycol 3350 (MIRALAX PO) Take 17 g by mouth daily as needed (constipation).     . potassium chloride (KLOR-CON) 10 MEQ tablet TAKE 1 TABLET(10 MEQ) BY MOUTH DAILY 90 tablet 3  . Prednisol Ace-Moxiflox-Bromfen 1-0.5-0.075 % SUSP Place 1 drop into the right eye 4 (four) times daily.    [place] tiZANidine (ZANAFLEX) 2 MG tablet Take 2 mg by mouth every 6 (six) hours as needed for muscle spasms.    . traMADol (ULTRAM) 50 MG tablet Take 1-2 tablets (50-100 mg total) by mouth every 6 (six) hours as needed for up to 4 doses for moderate pain. 4 tablet 0  . triamcinolone cream (KENALOG) 0.1 % Apply 1 application topically 2 (two) times daily. 453 g 0  . rivaroxaban (XARELTO) 2.5 MG TABS tablet Take by mouth.     No current facility-administered medications on file prior to visit.        ROS:  All others reviewed and negative.  Objective        PE:  BP 122/78 (BP Location: Left Arm, Patient Position: Sitting, Cuff Size: Large)   Pulse 63   Temp 98.6 F (37 C) (Oral)   Ht 5\' 3"  (1.6 m)   Wt 224 lb (101.6 kg)   SpO2 97%   BMI 39.68 kg/m                 Constitutional: Pt appears in NAD               HENT: Head: NCAT.                Right Ear: External ear normal.                  Left Ear: External ear normal.                Eyes: . Pupils are equal, round, and reactive to light. Conjunctivae and  EOM are normal               Nose: without d/c or deformity               Neck: Neck supple. Gross normal ROM               Cardiovascular: Normal rate and regular rhythm.                 Pulmonary/Chest: Effort normal and breath sounds without rales or wheezing.                Abd:  Soft, NT, ND, + BS, no organomegaly' left knee with trace effusion, NT with reduced ROM and bony degenerative changes               Neurological: Pt is alert. At baseline orientation, motor grossly intact               Skin: has bilat LE swelling left > right 1-2+ with distal warm tender erythema bilat near the ankles without red streaks abscess or drainage, o/w neurovasc intact               Psychiatric: Pt behavior is normal without agitation   Micro: none  Cardiac tracings I have personally interpreted today:  none  Pertinent Radiological findings (summarize): none   Lab Results  Component Value Date   WBC 10.7 (H) 01/14/2021   HGB 12.7 01/14/2021   HCT 38.6 01/14/2021   PLT 349.0 01/14/2021   GLUCOSE 109 (H) 01/14/2021   CHOL 210 (H) 01/14/2021   TRIG 228.0 (H) 01/14/2021   HDL 60.40 01/14/2021   LDLDIRECT 93.0 01/14/2021   LDLCALC 63 08/07/2020   ALT 13 01/14/2021   AST 14 01/14/2021   NA 140 01/14/2021   K 4.0 01/14/2021   CL 99 01/14/2021   CREATININE 1.07 01/14/2021   BUN 33 (H) 01/14/2021   CO2 32 01/14/2021   TSH 0.57 01/14/2021   INR 1.2 08/13/2020   HGBA1C 7.0 (H) 01/14/2021   Assessment/Plan:  [name] is a 75 y.o. Black or African American [2] female with  has a past medical history of Allergic rhinitis, cause unspecified, Allergy, Anemia, Cataract, Chronic headaches, Constipation, Diverticulosis, DJD (degenerative joint disease), lumbar, GERD (gastroesophageal reflux disease), Hemorrhoids, Hiatal hernia, HTN (hypertension), Hyperlipidemia, Obesity,  Osteoarthritis, and Tubular adenoma of colon.  Encounter for well adult exam with abnormal findings Age and sex appropriate education and counseling updated with regular exercise and diet Referrals for preventative services - none needed - already has scheduled mammogram and colonoscopy for may 2022 Immunizations addressed - none needed Smoking counseling  - none needed Evidence for depression or other mood disorder - none significant Most recent labs reviewed. I have personally reviewed and have noted: 1) the patient's medical and social history 2) The patient's current medications and supplements 3) The patient's height, weight, and BMI have been recorded in the chart   Mixed hyperlipidemia Lab Results  Component Value Date   [place] 63 08/07/2020   Stable, pt to continue current statin lipitor 20   Hyperglycemia Lab Results  Component Value Date   HGBA1C 7.0 (H) 01/14/2021   Stable, pt to continue current medical treatment  - diet, declines other tx for now   Essential (primary) hypertension BP Readings from Last 3 Encounters:  01/14/21 122/78  09/30/20 120/76  09/20/20 130/86   Stable, pt to continue medical treatment diltiazem, losartan   Cellulitis of lower leg  Mild to mod, for antibx course,  to f/u any worsening symptoms or concerns  Aortic atherosclerosis ([place]) Also to continue low chol diet, and lipitor 20  Peripheral edema Etiology unclear, for BNP, and tx for cellulitis as above  Followup: Return in about 6 months (around 07/17/2021).  [name], MD 01/21/2021 8:24 PM [facility]

## 2021-01-15 ENCOUNTER — Encounter: Payer: Self-pay | Admitting: Internal Medicine

## 2021-01-15 LAB — URINALYSIS, ROUTINE W REFLEX MICROSCOPIC
Bilirubin Urine: NEGATIVE
Hgb urine dipstick: NEGATIVE
Ketones, ur: NEGATIVE
Leukocytes,Ua: NEGATIVE
Nitrite: NEGATIVE
RBC / HPF: NONE SEEN (ref 0–?)
Specific Gravity, Urine: 1.015 (ref 1.000–1.030)
Total Protein, Urine: NEGATIVE
Urine Glucose: NEGATIVE
Urobilinogen, UA: 0.2 (ref 0.0–1.0)
pH: 6 (ref 5.0–8.0)

## 2021-01-21 ENCOUNTER — Encounter: Payer: Self-pay | Admitting: Internal Medicine

## 2021-01-21 NOTE — Assessment & Plan Note (Signed)
Mild to mod, for antibx course,  to f/u any worsening symptoms or concerns 

## 2021-01-21 NOTE — Assessment & Plan Note (Signed)
Lab Results  Component Value Date   LDLCALC 63 08/07/2020   Stable, pt to continue current statin lipitor 20

## 2021-01-21 NOTE — Assessment & Plan Note (Signed)
Age and sex appropriate education and counseling updated with regular exercise and diet Referrals for preventative services - none needed - already has scheduled mammogram and colonoscopy for may 2022 Immunizations addressed - none needed Smoking counseling  - none needed Evidence for depression or other mood disorder - none significant Most recent labs reviewed. I have personally reviewed and have noted: 1) the patient's medical and social history 2) The patient's current medications and supplements 3) The patient's height, weight, and BMI have been recorded in the chart

## 2021-01-21 NOTE — Assessment & Plan Note (Signed)
BP Readings from Last 3 Encounters:  01/14/21 122/78  09/30/20 120/76  09/20/20 130/86   Stable, pt to continue medical treatment diltiazem, losartan

## 2021-01-21 NOTE — Assessment & Plan Note (Signed)
Lab Results  Component Value Date   HGBA1C 7.0 (H) 01/14/2021   Stable, pt to continue current medical treatment  - diet, declines other tx for now

## 2021-01-21 NOTE — Assessment & Plan Note (Signed)
Etiology unclear, for BNP, and tx for cellulitis as above

## 2021-01-21 NOTE — Assessment & Plan Note (Signed)
Also to continue low chol diet, and lipitor 20

## 2021-01-23 ENCOUNTER — Telehealth: Payer: Self-pay | Admitting: Internal Medicine

## 2021-01-23 NOTE — Telephone Encounter (Signed)
   Patient calling to report since 3/29 ov she is still having pain, redness and swelling in legs. She feels the doxycycline (VIBRA-TABS) 100 MG tablet did not help. Does she need another appointment? Please advise

## 2021-01-23 NOTE — Telephone Encounter (Signed)
Patient has been advised and verbalized understanding. Will call Monday if symptoms persist or worsen. Closing encounter.

## 2021-01-23 NOTE — Telephone Encounter (Signed)
This is most likely redness from the swelling itself then.  Ok to finish the doxycycline, but work on taking the lasix 80 bid as prescribed, leg elevation and low salt diet.  We can see her back next wk if needs to be seen again,  thanks

## 2021-01-28 DIAGNOSIS — R6889 Other general symptoms and signs: Secondary | ICD-10-CM | POA: Diagnosis not present

## 2021-01-28 DIAGNOSIS — M48062 Spinal stenosis, lumbar region with neurogenic claudication: Secondary | ICD-10-CM | POA: Diagnosis not present

## 2021-01-30 ENCOUNTER — Ambulatory Visit: Payer: Medicare HMO | Admitting: Internal Medicine

## 2021-02-05 ENCOUNTER — Ambulatory Visit: Payer: Medicare Other | Admitting: Internal Medicine

## 2021-02-05 ENCOUNTER — Other Ambulatory Visit: Payer: Self-pay | Admitting: Internal Medicine

## 2021-02-05 MED ORDER — METOPROLOL TARTRATE 50 MG PO TABS: 50.0000 mg | ORAL_TABLET | Freq: Two times a day (BID) | ORAL | 3 refills | Status: DC

## 2021-02-05 MED ORDER — DULOXETINE HCL 20 MG PO CPEP: ORAL_CAPSULE | ORAL | 3 refills | Status: DC

## 2021-02-06 ENCOUNTER — Other Ambulatory Visit: Payer: Self-pay | Admitting: Internal Medicine

## 2021-02-07 ENCOUNTER — Ambulatory Visit: Payer: Medicare Other

## 2021-02-13 ENCOUNTER — Other Ambulatory Visit: Payer: Self-pay | Admitting: Internal Medicine

## 2021-02-13 MED ORDER — METOPROLOL TARTRATE 50 MG PO TABS: 50.0000 mg | ORAL_TABLET | Freq: Two times a day (BID) | ORAL | 3 refills | Status: DC

## 2021-02-13 MED ORDER — DULOXETINE HCL 20 MG PO CPEP: ORAL_CAPSULE | ORAL | 3 refills | Status: DC

## 2021-02-18 ENCOUNTER — Ambulatory Visit: Payer: Medicare HMO | Admitting: Internal Medicine

## 2021-02-18 ENCOUNTER — Encounter: Payer: Self-pay | Admitting: Internal Medicine

## 2021-02-18 ENCOUNTER — Other Ambulatory Visit: Payer: Self-pay

## 2021-02-18 VITALS — BP 122/76 | HR 61 | Ht 63.0 in | Wt 223.2 lb

## 2021-02-18 DIAGNOSIS — D509 Iron deficiency anemia, unspecified: Secondary | ICD-10-CM

## 2021-02-18 DIAGNOSIS — K219 Gastro-esophageal reflux disease without esophagitis: Secondary | ICD-10-CM

## 2021-02-18 DIAGNOSIS — K5909 Other constipation: Secondary | ICD-10-CM

## 2021-02-18 DIAGNOSIS — R1319 Other dysphagia: Secondary | ICD-10-CM

## 2021-02-18 DIAGNOSIS — Z86010 Personal history of colonic polyps: Secondary | ICD-10-CM

## 2021-02-18 DIAGNOSIS — R6889 Other general symptoms and signs: Secondary | ICD-10-CM | POA: Diagnosis not present

## 2021-02-18 MED ORDER — SUPREP BOWEL PREP KIT 17.5-3.13-1.6 GM/177ML PO SOLN: 1.0000 | ORAL | 0 refills | Status: DC

## 2021-02-18 MED ORDER — PANTOPRAZOLE SODIUM 40 MG PO TBEC: 40.0000 mg | DELAYED_RELEASE_TABLET | Freq: Every day | ORAL | 0 refills | Status: DC

## 2021-02-18 NOTE — Patient Instructions (Signed)
You have been scheduled for an endoscopy and colonoscopy. Please follow the written instructions given to you at your visit today. Please pick up your prep supplies at the pharmacy within the next 1-3 days. If you use inhalers (even only as needed), please bring them with you on the day of your procedure.  Please DISCONTINUE Iron 1 week prior to your endoscopy/colonoscopy procedure.  We have sent the following medications to your pharmacy for you to pick up at your convenience: Pantoprazole 40 mg once daily (in place of Nexium)  DISCONTINUE Nexium.  Continue Metamucil. If Metamucil is ineffective alone, You may add Miralax (over the counter) as needed.  If you are age 66 or older, your body mass index should be between 23-30. Your Body mass index is 39.55 kg/m. If this is out of the aforementioned range listed, please consider follow up with your Primary Care Provider.  Due to recent changes in healthcare laws, you may see the results of your imaging and laboratory studies on MyChart before your provider has had a chance to review them.  We understand that in some cases there may be results that are confusing or concerning to you. Not all laboratory results come back in the same time frame and the provider may be waiting for multiple results in order to interpret others.  Please give [place] 48 hours in order for your provider to thoroughly review all the results before contacting the office for clarification of your results.

## 2021-02-18 NOTE — Progress Notes (Signed)
   Subjective:    Patient ID: [name], female    DOB: 25-Aug-1946, 75 y.o.   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  She is here alone today and was last seen on 06/15/2019.  She reports that she has had ongoing issues with solid food dysphagia.  This is more noticeable in the last 6 to 12 months though is not a daily problem.  She has to push solid foods at times down with liquids.  No regurgitation.  Heartburn is controlled on her daily PPI, Nexium 40 mg.  No odynophagia.  If she eats greasy food she can have breakthrough heartburn or indigestion but this is quite rare.  She at times can have constipation though she is using Metamucil which is worked good for her.  She has a bowel movement approximately every day.  She will use MiraLAX if needed.  Rarely will she see red blood with wiping.  She is back on oral iron.  She had a PE and was treated with anticoagulation for 6 months and anticoagulation was stopped.   Review of Systems As per HPI, otherwise negative  Current Medications, Allergies, Past Medical History, Past Surgical History, Family History and Social History were reviewed in [employer] record.      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  1. GERD/esophageal dysphagia --she is not convinced that the Nexium is working as well as it should be and wonders if a new medication might be helpful.  I recommended upper endoscopy to evaluate dysphagia and consider esophageal dilation.  We reviewed the risk, benefits and alternatives and she is agreeable and wishes to proceed -- Discontinue Nexium -- Begin pantoprazole 40 mg once daily -- Upper endoscopy in the [place] with probable dilation  2.  Mild constipation --continue Metamucil on a daily basis, MiraLAX can be added as needed for refractory constipation symptoms  3.  History of adenomatous colon polyp --surveillance colonoscopy recommended at this time.  We discussed the risk, benefits and alternatives and she is agreeable and wishes to proceed  4. IDA --history of iron deficiency anemia with EGD, video capsule endoscopy and colonoscopy in 2016.  Her anemia was more prevalent in 2021 but has resolved with the reinitiation of oral iron -- Continue oral iron daily though she will hold this for 1 week prior to upper endoscopy and colonoscopy -- Primary care to continue monitoring hemoglobin and iron studies

## 2021-02-20 ENCOUNTER — Encounter: Payer: Self-pay | Admitting: *Deleted

## 2021-02-20 ENCOUNTER — Other Ambulatory Visit: Payer: Self-pay | Admitting: Internal Medicine

## 2021-02-24 ENCOUNTER — Encounter: Payer: Medicare HMO | Admitting: Internal Medicine

## 2021-02-25 ENCOUNTER — Ambulatory Visit: Payer: Medicare HMO | Admitting: Dermatology

## 2021-03-22 ENCOUNTER — Other Ambulatory Visit: Payer: Self-pay

## 2021-03-22 DIAGNOSIS — I872 Venous insufficiency (chronic) (peripheral): Secondary | ICD-10-CM

## 2021-03-24 ENCOUNTER — Ambulatory Visit
Admission: RE | Admit: 2021-03-24 | Discharge: 2021-03-24 | Disposition: A | Discharge status: Home / Self Care | Payer: Medicare HMO | Source: Ambulatory Visit | Attending: Internal Medicine | Admitting: Internal Medicine

## 2021-03-24 ENCOUNTER — Other Ambulatory Visit: Payer: Self-pay

## 2021-03-24 DIAGNOSIS — Z1231 Encounter for screening mammogram for malignant neoplasm of breast: Secondary | ICD-10-CM | POA: Diagnosis not present

## 2021-03-24 DIAGNOSIS — R6889 Other general symptoms and signs: Secondary | ICD-10-CM | POA: Diagnosis not present

## 2021-03-26 DIAGNOSIS — M48062 Spinal stenosis, lumbar region with neurogenic claudication: Secondary | ICD-10-CM | POA: Diagnosis not present

## 2021-03-26 DIAGNOSIS — R6889 Other general symptoms and signs: Secondary | ICD-10-CM | POA: Diagnosis not present

## 2021-04-18 DIAGNOSIS — M1712 Unilateral primary osteoarthritis, left knee: Secondary | ICD-10-CM | POA: Diagnosis not present

## 2021-05-08 ENCOUNTER — Ambulatory Visit: Payer: Medicare HMO | Admitting: Internal Medicine

## 2021-05-09 ENCOUNTER — Telehealth: Payer: Self-pay

## 2021-05-09 NOTE — Telephone Encounter (Signed)
pt has stated her lt leg is swollen, red and in a lot of pain. Pt states can some medication be sent in.  **Pt states due to her transportation never showing up on yesterday she was not able to come in for her apptmnt and has r/s it to Tuesday.  **Sent to [facility]

## 2021-05-13 ENCOUNTER — Ambulatory Visit (INDEPENDENT_AMBULATORY_CARE_PROVIDER_SITE_OTHER): Payer: Medicare HMO | Admitting: Internal Medicine

## 2021-05-13 ENCOUNTER — Ambulatory Visit (HOSPITAL_COMMUNITY)
Admission: RE | Admit: 2021-05-13 | Discharge: 2021-05-13 | Disposition: A | Discharge status: Home / Self Care | Payer: Medicare HMO | Source: Ambulatory Visit | Attending: Internal Medicine | Admitting: Internal Medicine

## 2021-05-13 ENCOUNTER — Encounter (HOSPITAL_COMMUNITY): Payer: Self-pay

## 2021-05-13 ENCOUNTER — Encounter: Payer: Self-pay | Admitting: Internal Medicine

## 2021-05-13 ENCOUNTER — Other Ambulatory Visit: Payer: Self-pay

## 2021-05-13 VITALS — BP 146/82 | HR 68 | Temp 98.1°F | Ht 63.0 in | Wt 230.0 lb

## 2021-05-13 DIAGNOSIS — R739 Hyperglycemia, unspecified: Secondary | ICD-10-CM | POA: Diagnosis not present

## 2021-05-13 DIAGNOSIS — M79662 Pain in left lower leg: Secondary | ICD-10-CM | POA: Diagnosis not present

## 2021-05-13 DIAGNOSIS — I10 Essential (primary) hypertension: Secondary | ICD-10-CM | POA: Diagnosis not present

## 2021-05-13 DIAGNOSIS — L03116 Cellulitis of left lower limb: Secondary | ICD-10-CM | POA: Diagnosis not present

## 2021-05-13 DIAGNOSIS — L03119 Cellulitis of unspecified part of limb: Secondary | ICD-10-CM

## 2021-05-13 DIAGNOSIS — M7989 Other specified soft tissue disorders: Secondary | ICD-10-CM | POA: Diagnosis not present

## 2021-05-13 MED ORDER — DOXYCYCLINE HYCLATE 100 MG PO TABS: 100.0000 mg | ORAL_TABLET | Freq: Two times a day (BID) | ORAL | 0 refills | Status: DC

## 2021-05-13 NOTE — Telephone Encounter (Signed)
Patient notified of Dr. Jenny Reichmann last note. She decline going to an UC or the ED and prefers to keep her 11:20 appt with Dr. Jenny Reichmann. She states she does not have time to wait all day at the ED.

## 2021-05-13 NOTE — Telephone Encounter (Signed)
Unfortunately, just calling in medicine is not a good idea, as this could be a blood clot or infection, both of which can be quite serious  Pt should go to UC or ED now, and may have to call 911 for non emergent transportation to ED

## 2021-05-13 NOTE — Progress Notes (Unsigned)
Left lower venous has been completed and is negative for DVT. Preliminary results can be found under CV proc through chart review.  Hadiya Spoerl RVT [facility]  

## 2021-05-13 NOTE — Patient Instructions (Addendum)
Please take all new medication as prescribed - the antibiotic  You will be contacted regarding the referral for: leg vein ultrasound testing - at 1245PM at the [facility] ([place]) at [address]   Please continue all other medications as before, and refills have been done if requested.  Please have the pharmacy call with any other refills you may need.  Please keep your appointments with your specialists as you may have planned

## 2021-05-13 NOTE — Progress Notes (Signed)
Chief Complaint: follow up left leg pain and swelling, htn, hyperglycemia       HPI:  [name] is a 75 y.o. female here with c/o 1 wk onset gradually worsening left leg swelling to the upper leg as well as worsening feeling fatigued with soreness and redness of the distal leg above the ankle, but without noticing any red streaks, high fever, chills or abscess drainage.  Pain seems worse to walk, better to sit.  Pt denies chest pain, increased sob or doe, wheezing, orthopnea, PND, palpitations, dizziness or syncope. No prior hx of DVT but has hx of PE.  Not on anticoagulant at this time.  No overt bleeding or other bruising.  No worsening pain or swelling to the right leg.    Pt denies polydipsia, polyuria, or new focal neuro s/s.         Wt Readings from Last 3 Encounters:  05/13/21 230 lb (104.3 kg)  02/18/21 223 lb 4 oz (101.3 kg)  01/14/21 224 lb (101.6 kg)   BP Readings from Last 3 Encounters:  05/13/21 (!) 146/82  02/18/21 122/76  01/14/21 122/78         Past Medical History:  Diagnosis Date   Allergic rhinitis, cause unspecified    Allergy    SEASONAL   Anemia    Cataract    Bilateral   Chronic headaches    Constipation    Diverticulosis    DJD (degenerative joint disease), lumbar    GERD (gastroesophageal reflux disease)    Hemorrhoids    Hiatal hernia    HTN (hypertension)    Hyperlipidemia    Obesity    Osteoarthritis    Tubular adenoma of colon    Past Surgical History:  Procedure Laterality Date   ABLATION     COLONOSCOPY     TOTAL KNEE ARTHROPLASTY Right 04/14/2019   Procedure: TOTAL KNEE ARTHROPLASTY;  Surgeon: Dorna Leitz, MD;  Location: [facility];  Service: Orthopedics;  Laterality: Right;   TUBAL LIGATION      reports that she has never smoked. She has never used smokeless tobacco. She reports that she does not drink alcohol and does not use drugs. family history includes Breast cancer in her sister; Diabetes in her brother; Heart disease  in her mother; Hypertension in her mother. Allergies  Allergen Reactions   Other    Oxycodone    Soybean-Containing Drug Products Other (See Comments)    Allergy testing positive for soy beans but pt uses soy sauce   Sulfonamide Derivatives Itching and Rash   Current Outpatient Medications on File Prior to Visit  Medication Sig Dispense Refill   atorvastatin (LIPITOR) 20 MG tablet Take 1 tablet (20 mg total) by mouth daily. 90 tablet 3   azelastine (ASTELIN) 0.1 % nasal spray Place 1 spray into both nostrils 2 (two) times daily. Use in each nostril as directed (Patient taking differently: Place 1 spray into both nostrils 2 (two) times daily. Use in each nostril as directed) 90 mL 4   cetirizine (ZYRTEC) 10 MG tablet Take 1 tablet (10 mg total) by mouth daily. As needed (Patient taking differently: Take 10 mg by mouth daily as needed for allergies. As needed) 90 tablet 3   diltiazem (DILT-XR) 240 MG 24 hr capsule Take 1 capsule (240 mg total) by mouth daily. 90 capsule 3   DULoxetine (CYMBALTA) 20 MG capsule TAKE 2 CAPSULES(40 MG) BY MOUTH DAILY 180 capsule 3   ferrous  sulfate 325 (65 FE) MG tablet Take 1 tablet (325 mg total) by mouth 2 (two) times daily with a meal.  3   furosemide (LASIX) 80 MG tablet Take 1 tablet (80 mg total) by mouth 2 (two) times daily. 180 tablet 3   gabapentin (NEURONTIN) 100 MG capsule TAKE 2 CAPSULES BY MOUTH AT BEDTIME 180 capsule 2   hydrOXYzine (ATARAX/VISTARIL) 10 MG tablet Take 1 tablet (10 mg total) by mouth every 8 (eight) hours as needed for itching. 60 tablet 2   losartan (COZAAR) 100 MG tablet Take 1 tablet (100 mg total) by mouth daily. 90 tablet 2   metoprolol tartrate (LOPRESSOR) 50 MG tablet Take 1 tablet (50 mg total) by mouth 2 (two) times daily. 180 tablet 3   pantoprazole (PROTONIX) 40 MG tablet Take 1 tablet (40 mg total) by mouth daily. 90 tablet 0   Polyethyl Glycol-Propyl Glycol (SYSTANE OP) Place 1 drop into both eyes 3 (three) times daily as  needed (dry eyes).     Polyethylene Glycol 3350 (MIRALAX PO) Take 17 g by mouth daily as needed (constipation).      potassium chloride (KLOR-CON) 10 MEQ tablet TAKE 1 TABLET(10 MEQ) BY MOUTH DAILY 90 tablet 3   Prednisol Ace-Moxiflox-Bromfen 1-0.5-0.075 % SUSP Place 1 drop into the right eye 4 (four) times daily.     SUPREP BOWEL PREP KIT 17.5-3.13-1.6 GM/177ML SOLN Take 1 kit by mouth as directed. For colonoscopy prep BIN: [id number] PCN: CN GROUP: [id number] ID: [id number] 354 mL 0   tiZANidine (ZANAFLEX) 2 MG tablet Take 2 mg by mouth every 6 (six) hours as needed for muscle spasms.     traMADol (ULTRAM) 50 MG tablet Take 1-2 tablets (50-100 mg total) by mouth every 6 (six) hours as needed for up to 4 doses for moderate pain. 4 tablet 0   triamcinolone cream (KENALOG) 0.1 % Apply 1 application topically 2 (two) times daily. 453 g 0   No current facility-administered medications on file prior to visit.        ROS:  All others reviewed and negative.  Objective        PE:  BP (!) 146/82   Pulse 68   Temp 98.1 F (36.7 C) (Oral)   Ht _0  (1.6 m)   Wt 230 lb (104.3 kg)   SpO2 97%   BMI 40.74 kg/m                 Constitutional: Pt appears in NAD               HENT: Head: NCAT.                Right Ear: External ear normal.                 Left Ear: External ear normal.                Eyes: . Pupils are equal, round, and reactive to light. Conjunctivae and EOM are normal               Nose: without d/c or deformity               Neck: Neck supple. Gross normal ROM               Cardiovascular: Normal rate and regular rhythm.                 Pulmonary/Chest: Effort normal and breath sounds without rales or  wheezing.                Abd:  Soft, NT, ND, + BS, no organomegaly               Neurological: Pt is alert. At baseline orientation, motor grossly intact               Skin: Skin is warm. No rashes, no other new lesions, LE edema - RLE with 2+ edema to above the knee with distal  lateral red, heat and tender without red streaks or absces drainage;  + tender post left calf as well - no cords palpable, LLE o/w neurovasc intact               Psychiatric: Pt behavior is normal without agitation   Micro: none  Cardiac tracings I have personally interpreted today:  none  Pertinent Radiological findings (summarize): none   Lab Results  Component Value Date   WBC 10.7 (H) 01/14/2021   HGB 12.7 01/14/2021   HCT 38.6 01/14/2021   PLT 349.0 01/14/2021   GLUCOSE 109 (H) 01/14/2021   CHOL 210 (H) 01/14/2021   TRIG 228.0 (H) 01/14/2021   HDL 60.40 01/14/2021   LDLDIRECT 93.0 01/14/2021   LDLCALC 63 08/07/2020   ALT 13 01/14/2021   AST 14 01/14/2021   NA 140 01/14/2021   K 4.0 01/14/2021   CL 99 01/14/2021   CREATININE 1.07 01/14/2021   BUN 33 (H) 01/14/2021   CO2 32 01/14/2021   TSH 0.57 01/14/2021   INR 1.2 08/13/2020   HGBA1C 7.0 (H) 01/14/2021   Assessment/Plan:  [name] is a 75 y.o. Black or African American [2] female with  has a past medical history of Allergic rhinitis, cause unspecified, Allergy, Anemia, Cataract, Chronic headaches, Constipation, Diverticulosis, DJD (degenerative joint disease), lumbar, GERD (gastroesophageal reflux disease), Hemorrhoids, Hiatal hernia, HTN (hypertension), Hyperlipidemia, Obesity, Osteoarthritis, and Tubular adenoma of colon.  Essential (primary) hypertension BP Readings from Last 3 Encounters:  05/13/21 (!) 146/82  02/18/21 122/76  01/14/21 122/78   Mild uncontroll, likely reactive, pt to continue medical treatment no change - diltiatzem, lopressor, losartan   Hyperglycemia Lab Results  Component Value Date   HGBA1C 7.0 (H) 01/14/2021   Mild uncontroled, pt to continue current medical treatment  - diet, declines add oha today   Pain and swelling of lower leg, left New onset, cant r/o dvt - for stat LLE venous doppler  Cellulitis of left leg Mild to mod, for antibx course,  to f/u any worsening  symptoms or concerns  Followup: Return if symptoms worsen or fail to improve.  [name], MD 05/15/2021 6:09 AM [facility]

## 2021-05-15 ENCOUNTER — Other Ambulatory Visit: Payer: Self-pay

## 2021-05-15 ENCOUNTER — Ambulatory Visit (AMBULATORY_SURGERY_CENTER): Payer: Medicare HMO | Admitting: Internal Medicine

## 2021-05-15 ENCOUNTER — Encounter: Payer: Self-pay | Admitting: Internal Medicine

## 2021-05-15 VITALS — BP 144/78 | HR 68 | Temp 97.7°F | Resp 19 | Ht 63.0 in | Wt 223.0 lb

## 2021-05-15 DIAGNOSIS — K2960 Other gastritis without bleeding: Secondary | ICD-10-CM | POA: Diagnosis not present

## 2021-05-15 DIAGNOSIS — R1310 Dysphagia, unspecified: Secondary | ICD-10-CM | POA: Diagnosis not present

## 2021-05-15 DIAGNOSIS — D122 Benign neoplasm of ascending colon: Secondary | ICD-10-CM | POA: Diagnosis not present

## 2021-05-15 DIAGNOSIS — D509 Iron deficiency anemia, unspecified: Secondary | ICD-10-CM | POA: Diagnosis not present

## 2021-05-15 DIAGNOSIS — Z86010 Personal history of colonic polyps: Secondary | ICD-10-CM | POA: Diagnosis not present

## 2021-05-15 DIAGNOSIS — R1319 Other dysphagia: Secondary | ICD-10-CM

## 2021-05-15 DIAGNOSIS — I509 Heart failure, unspecified: Secondary | ICD-10-CM | POA: Diagnosis not present

## 2021-05-15 DIAGNOSIS — K219 Gastro-esophageal reflux disease without esophagitis: Secondary | ICD-10-CM | POA: Diagnosis not present

## 2021-05-15 DIAGNOSIS — K5909 Other constipation: Secondary | ICD-10-CM

## 2021-05-15 DIAGNOSIS — I10 Essential (primary) hypertension: Secondary | ICD-10-CM | POA: Diagnosis not present

## 2021-05-15 DIAGNOSIS — M7989 Other specified soft tissue disorders: Secondary | ICD-10-CM | POA: Insufficient documentation

## 2021-05-15 DIAGNOSIS — M79662 Pain in left lower leg: Secondary | ICD-10-CM | POA: Insufficient documentation

## 2021-05-15 HISTORY — PX: COLONOSCOPY: SHX174

## 2021-05-15 MED ORDER — SODIUM CHLORIDE 0.9 % IV SOLN: 500.0000 mL | Freq: Once | INTRAVENOUS | Status: DC

## 2021-05-15 NOTE — Progress Notes (Signed)
Report given to PACU, vss 

## 2021-05-15 NOTE — Op Note (Signed)
[place] Patient Name: [name] Procedure Date: 05/15/2021 10:49 AM MRN: [id number] Endoscopist: Jerene Bears , MD Age: 75 Referring MD:  Date of Birth: 05-Dec-1945 Gender: Female Account #: [id number] Procedure:                Colonoscopy Indications:              Surveillance: Personal history of non-advanced                            adenomatous polyps (2013), last colonoscopy Feb                            2016; history of iron-responsive IDA Medicines:                Monitored Anesthesia Care Procedure:                Pre-Anesthesia Assessment:                           - Prior to the procedure, a History and Physical                            was performed, and patient medications and                            allergies were reviewed. The patient's tolerance of                            previous anesthesia was also reviewed. The risks                            and benefits of the procedure and the sedation                            options and risks were discussed with the patient.                            All questions were answered, and informed consent                            was obtained. Prior Anticoagulants: The patient has                            taken no previous anticoagulant or antiplatelet                            agents. ASA Grade Assessment: III - A patient with                            severe systemic disease. After reviewing the risks                            and benefits, the patient was deemed in  satisfactory condition to undergo the procedure.                           After obtaining informed consent, the colonoscope                            was passed under direct vision. Throughout the                            procedure, the patient's blood pressure, pulse, and                            oxygen saturations were monitored continuously. The                            [id number]-HQ190L Colonoscope  was introduced through the                            anus and advanced to the cecum, identified by                            appendiceal orifice and ileocecal valve. The                            colonoscopy was performed without difficulty. The                            patient tolerated the procedure well. The quality                            of the bowel preparation was good. The ileocecal                            valve, appendiceal orifice, and rectum were                            photographed. Scope In: 11:11:51 AM Scope Out: 11:25:22 AM Scope Withdrawal Time: 0 hours 11 minutes 25 seconds  Total Procedure Duration: 0 hours 13 minutes 31 seconds  Findings:                 The digital rectal exam was normal.                           A 3 mm polyp was found in the ascending colon. The                            polyp was sessile. The polyp was removed with a                            cold snare. Resection and retrieval were complete.                           Multiple small and large-mouthed diverticula were  found in the sigmoid colon, descending colon and                            ascending colon.                           The retroflexed view of the distal rectum and anal                            verge was normal and showed no anal or rectal                            abnormalities. Complications:            No immediate complications. Estimated Blood Loss:     Estimated blood loss: none. Impression:               - One 3 mm polyp in the ascending colon, removed                            with a cold snare. Resected and retrieved.                           - Diverticulosis in the sigmoid colon, in the                            descending colon and in the ascending colon.                           - The distal rectum and anal verge are normal on                            retroflexion view. Recommendation:           - Patient has a contact  number available for                            emergencies. The signs and symptoms of potential                            delayed complications were discussed with the                            patient. Return to normal activities tomorrow.                            Written discharge instructions were provided to the                            patient.                           - Resume previous diet.                           - Continue present medications.                           -  Await pathology results.                           - No repeat colonoscopy due to age. Jerene Bears, MD 05/15/2021 11:36:45 AM This report has been signed electronically.

## 2021-05-15 NOTE — Assessment & Plan Note (Signed)
Mild to mod, for antibx course,  to f/u any worsening symptoms or concerns 

## 2021-05-15 NOTE — Progress Notes (Signed)
Late entry:  Albuterol given due to pt continuously cough and de sating to 88 to 89% on RA.  Neb given with sat > 92% on RA.

## 2021-05-15 NOTE — Progress Notes (Signed)
1050 Robinul 0.1 mg IV given due large amount of secretions upon assessment.  MD made aware, vss  

## 2021-05-15 NOTE — Patient Instructions (Signed)
Handouts given for Gastritis, polyps and diverticulosis.  Await pathology results.  No repeat colonoscopy due to age.   YOU HAD AN ENDOSCOPIC PROCEDURE TODAY AT [facility]:   Refer to the procedure report that was given to you for any specific questions about what was found during the examination.  If the procedure report does not answer your questions, please call your gastroenterologist to clarify.  If you requested that your care partner not be given the details of your procedure findings, then the procedure report has been included in a sealed envelope for you to review at your convenience later.  YOU SHOULD EXPECT: Some feelings of bloating in the abdomen. Passage of more gas than usual.  Walking can help get rid of the air that was put into your GI tract during the procedure and reduce the bloating. If you had a lower endoscopy (such as a colonoscopy or flexible sigmoidoscopy) you may notice spotting of blood in your stool or on the toilet paper. If you underwent a bowel prep for your procedure, you may not have a normal bowel movement for a few days.  Please Note:  You might notice some irritation and congestion in your nose or some drainage.  This is from the oxygen used during your procedure.  There is no need for concern and it should clear up in a day or so.  SYMPTOMS TO REPORT IMMEDIATELY:  Following lower endoscopy (colonoscopy or flexible sigmoidoscopy):  Excessive amounts of blood in the stool  Significant tenderness or worsening of abdominal pains  Swelling of the abdomen that is new, acute  Fever of 100F or higher  Following upper endoscopy (EGD)  Vomiting of blood or coffee ground material  New chest pain or pain under the shoulder blades  Painful or persistently difficult swallowing  New shortness of breath  Fever of 100F or higher  Black, tarry-looking stools  For urgent or emergent issues, a gastroenterologist can be reached at any hour by calling  [phone]. Do not use MyChart messaging for urgent concerns.    DIET:  We do recommend a small meal at first, but then you may proceed to your regular diet.  Drink plenty of fluids but you should avoid alcoholic beverages for 24 hours.  ACTIVITY:  You should plan to take it easy for the rest of today and you should NOT DRIVE or use heavy machinery until tomorrow (because of the sedation medicines used during the test).    FOLLOW UP: Our staff will call the number listed on your records 48-72 hours following your procedure to check on you and address any questions or concerns that you may have regarding the information given to you following your procedure. If we do not reach you, we will leave a message.  We will attempt to reach you two times.  During this call, we will ask if you have developed any symptoms of COVID 19. If you develop any symptoms (ie: fever, flu-like symptoms, shortness of breath, cough etc.) before then, please call [phone].  If you test positive for Covid 19 in the 2 weeks post procedure, please call and report this information to [place].    If any biopsies were taken you will be contacted by phone or by letter within the next 1-3 weeks.  Please call us at [phone] if you have not heard about the biopsies in 3 weeks.    SIGNATURES/CONFIDENTIALITY: You and/or your care partner have signed paperwork which will be entered  into your electronic medical record.  These signatures attest to the fact that that the information above on your After Visit Summary has been reviewed and is understood.  Full responsibility of the confidentiality of this discharge information lies with you and/or your care-partner.

## 2021-05-15 NOTE — Progress Notes (Signed)
Called to room to assist during endoscopic procedure.  Patient ID and intended procedure confirmed with present staff. Received instructions for my participation in the procedure from the performing physician.  

## 2021-05-15 NOTE — Assessment & Plan Note (Addendum)
Lab Results  Component Value Date   HGBA1C 7.0 (H) 01/14/2021   Mild uncontroled, pt to continue current medical treatment  - diet, declines add oha today

## 2021-05-15 NOTE — Assessment & Plan Note (Signed)
BP Readings from Last 3 Encounters:  05/13/21 (!) 146/82  02/18/21 122/76  01/14/21 122/78   Mild uncontroll, likely reactive, pt to continue medical treatment no change - diltiatzem, lopressor, losartan

## 2021-05-15 NOTE — Op Note (Signed)
[place] Patient Name: [name] Procedure Date: 05/15/2021 10:50 AM MRN: [id number] Endoscopist: Jerene Bears , MD Age: 75 Referring MD:  Date of Birth: 1946-02-12 Gender: Female Account #: [id number] Procedure:                Upper GI endoscopy Indications:              Dysphagia, Gastro-esophageal reflux disease,                            history of IDA responsive to oral iron, history of                            gastritis Medicines:                Monitored Anesthesia Care Procedure:                Pre-Anesthesia Assessment:                           - Prior to the procedure, a History and Physical                            was performed, and patient medications and                            allergies were reviewed. The patient's tolerance of                            previous anesthesia was also reviewed. The risks                            and benefits of the procedure and the sedation                            options and risks were discussed with the patient.                            All questions were answered, and informed consent                            was obtained. Prior Anticoagulants: The patient has                            taken no previous anticoagulant or antiplatelet                            agents. ASA Grade Assessment: III - A patient with                            severe systemic disease. After reviewing the risks                            and benefits, the patient was deemed in  satisfactory condition to undergo the procedure.                           After obtaining informed consent, the endoscope was                            passed under direct vision. Throughout the                            procedure, the patient's blood pressure, pulse, and                            oxygen saturations were monitored continuously. The                            GIF [id number] #[id number] was introduced through the                             mouth, and advanced to the second part of duodenum.                            The upper GI endoscopy was accomplished without                            difficulty. The patient tolerated the procedure                            well. Scope In: Scope Out: Findings:                 Normal mucosa was found in the entire esophagus.                           No endoscopic abnormality was evident in the                            esophagus to explain the patient's complaint of                            dysphagia. It was decided, however, to proceed with                            dilation of the middle and lower third of the                            esophagus and at the gastroesophageal junction. A                            TTS dilator was passed through the scope. Dilation                            with a 16-17-18 mm balloon dilator was performed to  18 mm. The inflated balloon was carefully pulled                            into the proximal esophagus was dilation effect.                            The dilation site was examined and showed no change.                           Localized moderate inflammation characterized by                            congestion (edema), erosions and erythema was found                            in the gastric antrum and in the prepyloric region                            of the stomach. Biopsies were taken with a cold                            forceps for histology and Helicobacter pylori                            testing.                           The cardia, gastric fundus and gastric body were                            normal. Biopsies were taken with a cold forceps for                            histology and Helicobacter pylori testing.                           The examined duodenum was normal. Complications:            No immediate complications. Estimated Blood Loss:     Estimated blood  loss was minimal. Impression:               - Normal mucosa was found in the entire esophagus.                            No endoscopic esophageal abnormality to explain                            patient's dysphagia. Esophagus dilated with 18 mm                            balloon.                           - Normal cardia, gastric fundus and gastric body.  Biopsied.                           - Gastritis with scattered erosions in                            antrum/pre-pylorus. Biopsied.                           - Normal examined duodenum. Recommendation:           - Patient has a contact number available for                            emergencies. The signs and symptoms of potential                            delayed complications were discussed with the                            patient. Return to normal activities tomorrow.                            Written discharge instructions were provided to the                            patient.                           - Resume previous diet.                           - Continue present medications.                           - Await pathology results.                           - See the other procedure note for documentation of                            additional recommendations. Jerene Bears, MD 05/15/2021 11:32:52 AM This report has been signed electronically.

## 2021-05-15 NOTE — Assessment & Plan Note (Signed)
New onset, cant r/o dvt - for stat LLE venous doppler

## 2021-05-19 ENCOUNTER — Telehealth: Payer: Self-pay | Admitting: *Deleted

## 2021-05-19 NOTE — Telephone Encounter (Signed)
  Follow up Call-  Call back number 05/15/2021  Post procedure Call Back phone  # [phone]  Permission to leave phone message Yes  Some recent data might be hidden     Patient questions:  Do you have a fever, pain , or abdominal swelling? No. Pain Score  0 *  Have you tolerated food without any problems? Yes.    Have you been able to return to your normal activities? Yes.    Do you have any questions about your discharge instructions: Diet   No. Medications  No. Follow up visit  No.  Do you have questions or concerns about your Care? No.  Actions: * If pain score is 4 or above: No action needed, pain <4.  Have you developed a fever since your procedure? no  2.   Have you had an respiratory symptoms (SOB or cough) since your procedure? no  3.   Have you tested positive for COVID 19 since your procedure no  4.   Have you had any family members/close contacts diagnosed with the COVID 19 since your procedure?  no   If yes to any of these questions please route to Joylene John, RN and Joella Prince, RN

## 2021-05-22 DIAGNOSIS — M48062 Spinal stenosis, lumbar region with neurogenic claudication: Secondary | ICD-10-CM | POA: Diagnosis not present

## 2021-05-23 ENCOUNTER — Other Ambulatory Visit: Payer: Self-pay | Admitting: Internal Medicine

## 2021-05-25 ENCOUNTER — Encounter: Payer: Self-pay | Admitting: Internal Medicine

## 2021-05-27 DIAGNOSIS — L700 Acne vulgaris: Secondary | ICD-10-CM | POA: Diagnosis not present

## 2021-05-27 DIAGNOSIS — L309 Dermatitis, unspecified: Secondary | ICD-10-CM | POA: Diagnosis not present

## 2021-05-27 DIAGNOSIS — R6889 Other general symptoms and signs: Secondary | ICD-10-CM | POA: Diagnosis not present

## 2021-05-27 DIAGNOSIS — L218 Other seborrheic dermatitis: Secondary | ICD-10-CM | POA: Diagnosis not present

## 2021-05-28 ENCOUNTER — Other Ambulatory Visit: Payer: Self-pay | Admitting: Internal Medicine

## 2021-05-28 NOTE — Progress Notes (Signed)
HPI female never smoker followed for allergic rhinitis, recurrent sinusitis, food allergy Allergy Profile 09/18/2013-total IgE 48. Broadly positive for common allergens including aspergillus.  Food profile positive for peanut, soybean. She reports banana and melons make her throat itch.  -------------------------------------------------------------------------.   3/5/ 21- Virtual Visit via Telephone Note   History of Present Illness: 75 year old female never smoker followed for Allergic rhinitis, recurrent sinusitis, food allergy, GERD, Acute Bilateral lung PE 3/21/ Xarelto,  Zyrtec, Astelin nasal,  Had R TKR last July. Acute bilateral PE without DVT, dx'd 12/18/19 and now on Xarelto f/u PCP.  Residual SOB without new acute events, bleeding, chest or calf pain. Dr Jenny Reichmann managing Xarelto. Had 2 Covax. More than usual nasal congestion as pollen season begins. Not wheezing.  Observations/Objective: ED visit 12/18/19-  Acute R lung PE with hx TKR R in July, 2020. No DVT. Had had intermittent chest tightness and DOE. Outpatient w/u with Ddimer, CTa and leg dopplers, then referred to ER.  Low sPESI score. [place] home on Xarelto   CTa chest 12/18/19- IMPRESSION: 1. Acute bilateral pulmonary emboli, extending to the lobar level. No saddle pulmonary emboli. Top-normal caliber main pulmonary artery. 2. No active pulmonary disease. 3. One vessel coronary atherosclerosis. 4. Aortic Atherosclerosis (ICD10-I70.0).  Assessment and Plan: Allergic Rhinitis- early seasonal exacerb> flonase, antihistamine, azelastine nasal PE- Appropriately on Xarelto, managed by Dr Jenny Reichmann  Follow Up Instructions: 1 year    05/29/21- 75 year old female never smoker followed for Allergic rhinitis, recurrent Sinusitis, food allergy, GERD, Acute Bilateral lung PE 3/21/ Xarelto, Spinal Stenosis, Aortic atherosclerosis,  CHF,  HTN, Obesity,   -Zyrtec, Astelin nasal,  Covid vax- -----13yr f/u. States she has been doing well  since last visit. Has noticed an increase in nasal drainage. Mucus has been clear.  Runny nose at times. Asks refill Astelin nasal spray, but usually ok just blowing nose.  Denies headache, purulent, wheeze or cough.  Now off Xarelto- reminded to move and avoid stasis.   ROS-see HPI   + = positive Constitutional:   No-   weight loss, night sweats, fevers, chills, fatigue, lassitude. HEENT:   + headaches, no-difficulty swallowing, tooth/dental problems, sore throat,       No-  sneezing, itching, +ear ache, +nasal congestion, post nasal drip,  CV:  No-   chest pain, orthopnea, PND, swelling in lower extremities, anasarca, dizziness, palpitations Resp: No-   shortness of breath with exertion or at rest.              No-productive cough, +non-productive cough,  No- coughing up of blood.              No-   change in color of mucus.  No- wheezing.   Skin: No-   rash or lesions. GI:  No-   heartburn, indigestion, abdominal pain, nausea, vomiting,  GU: . MS:  No-   joint pain or swelling.   Neuro-     nothing unusual Psych:  No- change in mood or affect. No depression or anxiety.  No memory loss.  OBJ- Physical Exam General- Alert, Oriented, Affect-appropriate, Distress- none acute. + Obese Skin- rash-none, lesions- none, excoriation- none Lymphadenopathy- none Head- atraumatic            Eyes- Gross vision intact, PERRLA, conjunctivae and secretions clear            Ears- Hearing, canals-normal, TMs are clear            Nose- clear, no-Septal dev, mucus, polyps, erosion, perforation,  looks healthy            Throat- Mallampati III , mucosa clear , drainage- none, tonsils- atrophic, + missing teeth Neck- flexible , trachea midline, no stridor , thyroid nl, carotid no bruit Chest - symmetrical excursion , unlabored           Heart/CV- RRR , no murmur , no gallop  , no rub, nl s1 s2                           - JVD- none , edema- none, stasis changes- none, varices- none           Lung- clear  to P&A, wheeze- none, cough- none, dullness-none, rub- none           Chest wall-  Abd- Br/ Gen/ Rectal- Not done, not indicated Extrem- +rolling walker Neuro- grossly intact to observation

## 2021-05-28 NOTE — Telephone Encounter (Signed)
Please refill as per office routine med refill policy (all routine meds refilled for 3 mo or monthly per pt preference up to one year from last visit, then month to month grace period for 3 mo, then further med refills will have to be denied)  

## 2021-05-29 ENCOUNTER — Encounter: Payer: Self-pay | Admitting: Internal Medicine

## 2021-05-29 ENCOUNTER — Ambulatory Visit: Payer: Medicare HMO | Admitting: Internal Medicine

## 2021-05-29 ENCOUNTER — Other Ambulatory Visit: Payer: Self-pay

## 2021-05-29 DIAGNOSIS — Z6839 Body mass index (BMI) 39.0-39.9, adult: Secondary | ICD-10-CM | POA: Diagnosis not present

## 2021-05-29 DIAGNOSIS — J302 Other seasonal allergic rhinitis: Secondary | ICD-10-CM | POA: Diagnosis not present

## 2021-05-29 DIAGNOSIS — J3089 Other allergic rhinitis: Secondary | ICD-10-CM

## 2021-05-29 DIAGNOSIS — I2699 Other pulmonary embolism without acute cor pulmonale: Secondary | ICD-10-CM

## 2021-05-29 MED ORDER — AZELASTINE HCL 0.1 % NA SOLN: 1.0000 | Freq: Two times a day (BID) | NASAL | 4 refills | Status: DC

## 2021-05-29 NOTE — Assessment & Plan Note (Signed)
Emphasized importance of moving and shifting to avoid blood flow stasis

## 2021-05-29 NOTE — Patient Instructions (Signed)
Astelin nasal spray refill was sent to [facility]  Please call if we can help

## 2021-05-29 NOTE — Assessment & Plan Note (Signed)
No progress

## 2021-05-29 NOTE — Assessment & Plan Note (Signed)
Just rhinorrhea currently. Plan- refill astelin, ok saline rinse if needed

## 2021-06-05 ENCOUNTER — Telehealth: Payer: Self-pay | Admitting: Lab

## 2021-06-05 NOTE — Progress Notes (Signed)
  Chronic Care Management   Outreach Note  06/05/2021 Name: [name] MRN: [id number] DOB: 08-29-1946  Referred by: Biagio Borg, MD Reason for referral : Medication Management   An unsuccessful telephone outreach was attempted today. The patient was referred to the pharmacist for assistance with care management and care coordination.   Follow Up Plan:   [name]

## 2021-06-05 NOTE — Chronic Care Management (AMB) (Signed)
  Chronic Care Management   Note  06/05/2021 Name: [name] MRN: [id number] DOB: 09-Nov-1945  [name] is a 75 y.o. year old female who is a primary care patient of Biagio Borg, MD. I reached out to [name] by phone today in response to a referral sent by [name]'s PCP, Biagio Borg, MD.   [name] was given information about Chronic Care Management services today including:  CCM service includes personalized support from designated clinical staff supervised by her physician, including individualized plan of care and coordination with other care providers 24/7 contact phone numbers for assistance for urgent and routine care needs. Service will only be billed when office clinical staff spend 20 minutes or more in a month to coordinate care. Only one practitioner may furnish and bill the service in a calendar month. The patient may stop CCM services at any time (effective at the end of the month) by phone call to the office staff.   Patient agreed to services and verbal consent obtained.   Follow up plan: [place]

## 2021-06-11 ENCOUNTER — Telehealth: Payer: Self-pay

## 2021-06-17 NOTE — Telephone Encounter (Signed)
LVM instructions for pt to call in regards to medication refill request. Medication has not been filled since 2018.

## 2021-06-17 NOTE — Telephone Encounter (Signed)
Pt states she does not need medication b/c she has enough. Medication will not be refilled at this time.

## 2021-06-20 ENCOUNTER — Telehealth: Payer: Self-pay

## 2021-06-20 ENCOUNTER — Other Ambulatory Visit: Payer: Self-pay | Admitting: Internal Medicine

## 2021-06-20 MED ORDER — TRAMADOL HCL 50 MG PO TABS: 50.0000 mg | ORAL_TABLET | Freq: Four times a day (QID) | ORAL | 0 refills | Status: DC | PRN

## 2021-06-22 ENCOUNTER — Telehealth: Payer: Self-pay | Admitting: Internal Medicine

## 2021-06-24 DIAGNOSIS — R6889 Other general symptoms and signs: Secondary | ICD-10-CM | POA: Diagnosis not present

## 2021-06-24 DIAGNOSIS — M48062 Spinal stenosis, lumbar region with neurogenic claudication: Secondary | ICD-10-CM | POA: Diagnosis not present

## 2021-06-24 NOTE — Telephone Encounter (Signed)
Please advise; prescription refill for Tizanidine. Did you prescribe this for patient?

## 2021-06-25 MED ORDER — TIZANIDINE HCL 2 MG PO TABS: 2.0000 mg | ORAL_TABLET | Freq: Four times a day (QID) | ORAL | 3 refills | Status: DC | PRN

## 2021-07-08 ENCOUNTER — Telehealth: Payer: Self-pay

## 2021-07-08 NOTE — Chronic Care Management (AMB) (Signed)
Chronic Care Management Pharmacy Assistant   Name: Edra Riccardi  MRN: [id number] DOB: Jul 09, 1946  [name] is an 75 y.o. year old female who presents for her initial CCM visit with the clinical pharmacist.   Recent office visits:  05/13/21 [name] MD PCP- pt was seen for left leg pain and swelling.A DVT was ordered and Doxycycline Hyclate 100 mg BID.   01/14/21 [name] MD- pt was seen for well exam. Labs were ordered and pt started on Doxycycline 100 mg BID. Follow up 1 year.  Recent consult visits:  05/29/21 Baird Lyons MD Pulm. - pt was seen for Seasonal and perennial allergic rninitis. No labs or med changes. Follow in 1 year.  05/22/21 Lenord Carbo- pt was seen for Spinal stenosis. Unable to view documentation.  02/18/21 Lajuan Lines. Pyrtle MD Gertie Fey- pt was seen for GERD.  A referral was placed for Gastro. Pt disc. Doxy. 100 MG, Rivobaxin 2.5 mg,and esomeprazole  40 mg. Pt started Pantoprazole 40 mg daily, and SUPREP BOWEL PREP KIT 17.5-3.13-1.6 GM/177ML SOLN. Follow up not noted.  Hospital visits:  None in previous 6 months  Medications: Outpatient Encounter Medications as of 07/08/2021  Medication Sig   atorvastatin (LIPITOR) 20 MG tablet Take 1 tablet (20 mg total) by mouth daily.   azelastine (ASTELIN) 0.1 % nasal spray Place 1 spray into both nostrils 2 (two) times daily. Use in each nostril as directed   cetirizine (ZYRTEC) 10 MG tablet Take 1 tablet (10 mg total) by mouth daily. As needed (Patient taking differently: Take 10 mg by mouth daily as needed for allergies. As needed)   diltiazem (DILT-XR) 240 MG 24 hr capsule Take 1 capsule (240 mg total) by mouth daily.   doxycycline (VIBRA-TABS) 100 MG tablet Take 1 tablet (100 mg total) by mouth 2 (two) times daily.   DULoxetine (CYMBALTA) 20 MG capsule TAKE 2 CAPSULES(40 MG) BY MOUTH DAILY   ferrous sulfate 325 (65 FE) MG tablet Take 1 tablet (325 mg total) by mouth 2 (two) times daily with a meal.    furosemide (LASIX) 80 MG tablet Take 1 tablet (80 mg total) by mouth 2 (two) times daily.   gabapentin (NEURONTIN) 100 MG capsule TAKE 2 CAPSULES BY MOUTH AT BEDTIME   hydrOXYzine (ATARAX/VISTARIL) 10 MG tablet Take 1 tablet (10 mg total) by mouth every 8 (eight) hours as needed for itching.   losartan (COZAAR) 100 MG tablet TAKE 1 TABLET EVERY DAY   metoprolol tartrate (LOPRESSOR) 50 MG tablet Take 1 tablet (50 mg total) by mouth 2 (two) times daily.   pantoprazole (PROTONIX) 40 MG tablet TAKE 1 TABLET(40 MG) BY MOUTH DAILY   Polyethyl Glycol-Propyl Glycol (SYSTANE OP) Place 1 drop into both eyes 3 (three) times daily as needed (dry eyes).   Polyethylene Glycol 3350 (MIRALAX PO) Take 17 g by mouth daily as needed (constipation).    potassium chloride (KLOR-CON) 10 MEQ tablet TAKE 1 TABLET(10 MEQ) BY MOUTH DAILY   Prednisol Ace-Moxiflox-Bromfen 1-0.5-0.075 % SUSP Place 1 drop into the right eye 4 (four) times daily.   tiZANidine (ZANAFLEX) 2 MG tablet Take 1 tablet (2 mg total) by mouth every 6 (six) hours as needed for muscle spasms.   traMADol (ULTRAM) 50 MG tablet Take 1-2 tablets (50-100 mg total) by mouth every 6 (six) hours as needed for up to 30 doses for moderate pain.   triamcinolone cream (KENALOG) 0.1 % Apply 1 application topically 2 (two) times daily.  No facility-administered encounter medications on file as of 07/08/2021.    Current Medication List  atorvastatin (LIPITOR) 20 MG tablet last filled 05/27/21 90 DS  azelastine (ASTELIN) 0.1 % nasal spray last filled 05/30/21 90 DS  cetirizine (ZYRTEC) 10 MG tablet   diltiazem (DILT-XR) 240 MG 24 hr capsule last filled 05/27/21 90 DS doxycycline (VIBRA-TABS) 100 MG tablet last filled 05/13/21 10 DS  DULoxetine (CYMBALTA) 20 MG capsule last filled 04/15/21 90 DS  ferrous sulfate 325 (65 FE) MG tablet  furosemide (LASIX) 80 MG tablet last filled 05/27/21 90 DS  gabapentin (NEURONTIN) 100 MG capsule last filled 05/27/21 90 DS hydrOXYzine  (ATARAX/VISTARIL) 10 MG tablet last filled 05/27/21 20 DS  losartan (COZAAR) 100 MG tablet last filled 06/03/21 90 DS  metoprolol tartrate (LOPRESSOR) 50 MG tablet last filled 04/15/21 90 DS  pantoprazole (PROTONIX) 40 MG tablet last filled 05/23/21 90 DS Polyethyl Glycol-Propyl Glycol (SYSTANE OP) Polyethylene Glycol 3350 (MIRALAX PO) tiZANidine (ZANAFLEX) 2 MG tablet last filled 04/23/21 30 DS traMADol (ULTRAM) 50 MG tablet last filled 05/27/21 10 DS Triamcinolone cream (KENALOG) 0.1 %   [place] Pharmacist Assistant [phone]

## 2021-07-14 ENCOUNTER — Ambulatory Visit (INDEPENDENT_AMBULATORY_CARE_PROVIDER_SITE_OTHER): Payer: Medicare HMO | Admitting: Internal Medicine

## 2021-07-14 ENCOUNTER — Other Ambulatory Visit: Payer: Self-pay

## 2021-07-14 ENCOUNTER — Encounter: Payer: Self-pay | Admitting: Internal Medicine

## 2021-07-14 VITALS — BP 132/82 | HR 68 | Temp 98.3°F | Resp 18 | Ht 63.0 in | Wt 227.4 lb

## 2021-07-14 DIAGNOSIS — N183 Chronic kidney disease, stage 3 unspecified: Secondary | ICD-10-CM | POA: Insufficient documentation

## 2021-07-14 DIAGNOSIS — N1831 Chronic kidney disease, stage 3a: Secondary | ICD-10-CM | POA: Diagnosis not present

## 2021-07-14 DIAGNOSIS — Z23 Encounter for immunization: Secondary | ICD-10-CM

## 2021-07-14 DIAGNOSIS — E559 Vitamin D deficiency, unspecified: Secondary | ICD-10-CM | POA: Diagnosis not present

## 2021-07-14 DIAGNOSIS — R739 Hyperglycemia, unspecified: Secondary | ICD-10-CM | POA: Diagnosis not present

## 2021-07-14 DIAGNOSIS — R6889 Other general symptoms and signs: Secondary | ICD-10-CM | POA: Diagnosis not present

## 2021-07-14 DIAGNOSIS — I10 Essential (primary) hypertension: Secondary | ICD-10-CM

## 2021-07-14 DIAGNOSIS — E782 Mixed hyperlipidemia: Secondary | ICD-10-CM

## 2021-07-14 LAB — LIPID PANEL
Cholesterol: 198 mg/dL (ref 0–200)
HDL: 68.3 mg/dL (ref >39.00)
LDL Cholesterol: 107 mg/dL — ABNORMAL HIGH (ref 0–99)
NonHDL: 129.36
Total CHOL/HDL Ratio: 3
Triglycerides: 114 mg/dL (ref 0.0–149.0)
VLDL: 22.8 mg/dL (ref 0.0–40.0)

## 2021-07-14 LAB — HEPATIC FUNCTION PANEL
ALT: 19 U/L (ref 0–35)
AST: 15 U/L (ref 0–37)
Albumin: 4 g/dL (ref 3.5–5.2)
Alkaline Phosphatase: 119 U/L — ABNORMAL HIGH (ref 39–117)
Bilirubin, Direct: 0.1 mg/dL (ref 0.0–0.3)
Total Bilirubin: 0.3 mg/dL (ref 0.2–1.2)
Total Protein: 7.2 g/dL (ref 6.0–8.3)

## 2021-07-14 LAB — HEMOGLOBIN A1C: Hgb A1c MFr Bld: 6.6 % — ABNORMAL HIGH (ref 4.6–6.5)

## 2021-07-14 LAB — BASIC METABOLIC PANEL
BUN: 30 mg/dL — ABNORMAL HIGH (ref 6–23)
CO2: 30 mEq/L (ref 19–32)
Calcium: 9.6 mg/dL (ref 8.4–10.5)
Chloride: 100 mEq/L (ref 96–112)
Creatinine, Ser: 1.34 mg/dL — ABNORMAL HIGH (ref 0.40–1.20)
GFR: 39.08 mL/min — ABNORMAL LOW (ref >60.00)
Glucose, Bld: 91 mg/dL (ref 70–99)
Potassium: 3.9 mEq/L (ref 3.5–5.1)
Sodium: 140 mEq/L (ref 135–145)

## 2021-07-14 LAB — VITAMIN D 25 HYDROXY (VIT D DEFICIENCY, FRACTURES): VITD: 33.18 ng/mL (ref 30.00–100.00)

## 2021-07-14 MED ORDER — ATORVASTATIN CALCIUM 40 MG PO TABS: 40.0000 mg | ORAL_TABLET | Freq: Every day | ORAL | 3 refills | Status: DC

## 2021-07-14 MED ORDER — CHOLECALCIFEROL 50 MCG (2000 UT) PO TABS: ORAL_TABLET | ORAL | 99 refills | Status: DC

## 2021-07-14 NOTE — Patient Instructions (Addendum)
You had the flu shot today  Ok to increase the lipitor to 40 mg per day  Please take OTC Vitamin D3 at 2000 units per day, indefinitely  Please continue all other medications as before, and refills have been done if requested.  Please have the pharmacy call with any other refills you may need.  Please continue your efforts at being more active, low cholesterol diet, and weight control.  Please keep your appointments with your specialists as you may have planned  Please go to the LAB at the blood drawing area for the tests to be done  You will be contacted by phone if any changes need to be made immediately.  Otherwise, you will receive a letter about your results with an explanation, but please check with MyChart first.  Please remember to sign up for MyChart if you have not done so, as this will be important to you in the future with finding out test results, communicating by private email, and scheduling acute appointments online when needed.  Please make an Appointment to return in 6 months, or sooner if needed

## 2021-07-14 NOTE — Progress Notes (Signed)
Patient ID: [name], female   DOB: Feb 21, 1946, 75 y.o.   MRN: [id number]        Chief Complaint: follow up HTN, HLD and hyperglycemia, ckd, low vit d        HPI:  [name] is a 75 y.o. female here overall doing ok.  Pt denies chest pain, increased sob or doe, wheezing, orthopnea, PND, increased LE swelling, palpitations, dizziness or syncope.   Pt denies polydipsia, polyuria, or new focal neuro s/s.   Pt denies fever, wt loss, night sweats, loss of appetite, or other constitutional symptoms   Pt continues to have recurring LBP without change in severity, bowel or bladder change, fever, wt loss,  worsening LE pain/numbness/weakness, gait change or falls, seeing pain management, holding for ESI shots for now.   Not taking Vit D .  Trying to follow lower cho ldiet.         Wt Readings from Last 3 Encounters:  07/14/21 227 lb 6.4 oz (103.1 kg)  05/29/21 227 lb 9.6 oz (103.2 kg)  05/15/21 223 lb (101.2 kg)   BP Readings from Last 3 Encounters:  07/14/21 132/82  05/29/21 126/82  05/15/21 (!) 144/78         Past Medical History:  Diagnosis Date   Allergic rhinitis, cause unspecified    Allergy    SEASONAL   Anemia    Cataract    Bilateral   CHF (congestive heart failure) (HCC)    Chronic headaches    Constipation    Diverticulosis    DJD (degenerative joint disease), lumbar    GERD (gastroesophageal reflux disease)    Hemorrhoids    Hiatal hernia    HTN (hypertension)    Hyperlipidemia    Obesity    Osteoarthritis    Tubular adenoma of colon    Past Surgical History:  Procedure Laterality Date   ABLATION     uterus   COLONOSCOPY  05/15/2021   Pyrtle   TOTAL KNEE ARTHROPLASTY Right 04/14/2019   Procedure: TOTAL KNEE ARTHROPLASTY;  Surgeon: Dorna Leitz, MD;  Location: [facility];  Service: Orthopedics;  Laterality: Right;   TUBAL LIGATION      reports that she has never smoked. She has never been exposed to tobacco smoke. She has never used smokeless  tobacco. She reports that she does not drink alcohol and does not use drugs. family history includes Breast cancer in her sister; Diabetes in her brother; Heart disease in her mother; Hypertension in her mother. Allergies  Allergen Reactions   Oxycodone    Soybean-Containing Drug Products Other (See Comments)    Allergy testing positive for soy beans but pt uses soy sauce   Sulfonamide Derivatives Itching and Rash   Current Outpatient Medications on File Prior to Visit  Medication Sig Dispense Refill   azelastine (ASTELIN) 0.1 % nasal spray Place 1 spray into both nostrils 2 (two) times daily. Use in each nostril as directed 90 mL 4   cetirizine (ZYRTEC) 10 MG tablet Take 1 tablet (10 mg total) by mouth daily. As needed (Patient taking differently: Take 10 mg by mouth daily as needed for allergies. As needed) 90 tablet 3   diltiazem (DILT-XR) 240 MG 24 hr capsule Take 1 capsule (240 mg total) by mouth daily. 90 capsule 3   doxycycline (VIBRA-TABS) 100 MG tablet Take 1 tablet (100 mg total) by mouth 2 (two) times daily. 20 tablet 0   DULoxetine (CYMBALTA) 20 MG capsule TAKE 2 CAPSULES(40 MG)  BY MOUTH DAILY 180 capsule 3   ferrous sulfate 325 (65 FE) MG tablet Take 1 tablet (325 mg total) by mouth 2 (two) times daily with a meal.  3   furosemide (LASIX) 80 MG tablet Take 1 tablet (80 mg total) by mouth 2 (two) times daily. 180 tablet 3   gabapentin (NEURONTIN) 100 MG capsule TAKE 2 CAPSULES BY MOUTH AT BEDTIME 180 capsule 2   HYDROcodone-acetaminophen (NORCO/VICODIN) 5-325 MG tablet Take 1 tablet by mouth as needed.     hydrOXYzine (ATARAX/VISTARIL) 10 MG tablet Take 1 tablet (10 mg total) by mouth every 8 (eight) hours as needed for itching. 60 tablet 2   losartan (COZAAR) 100 MG tablet TAKE 1 TABLET EVERY DAY 90 tablet 2   metoprolol tartrate (LOPRESSOR) 50 MG tablet Take 1 tablet (50 mg total) by mouth 2 (two) times daily. 180 tablet 3   pantoprazole (PROTONIX) 40 MG tablet TAKE 1 TABLET(40  MG) BY MOUTH DAILY 90 tablet 0   Polyethyl Glycol-Propyl Glycol (SYSTANE OP) Place 1 drop into both eyes 3 (three) times daily as needed (dry eyes).     Polyethylene Glycol 3350 (MIRALAX PO) Take 17 g by mouth daily as needed (constipation).      potassium chloride (KLOR-CON) 10 MEQ tablet TAKE 1 TABLET(10 MEQ) BY MOUTH DAILY 90 tablet 3   Prednisol Ace-Moxiflox-Bromfen 1-0.5-0.075 % SUSP Place 1 drop into the right eye 4 (four) times daily.     tiZANidine (ZANAFLEX) 2 MG tablet Take 1 tablet (2 mg total) by mouth every 6 (six) hours as needed for muscle spasms. 40 tablet 3   traMADol (ULTRAM) 50 MG tablet Take 1-2 tablets (50-100 mg total) by mouth every 6 (six) hours as needed for up to 30 doses for moderate pain. 30 tablet 0   triamcinolone cream (KENALOG) 0.1 % Apply 1 application topically 2 (two) times daily. 453 g 0   No current facility-administered medications on file prior to visit.        ROS:  All others reviewed and negative.  Objective        PE:  BP 132/82   Pulse 68   Temp 98.3 F (36.8 C) (Oral)   Resp 18   Ht 5\' 3"  (1.6 m)   Wt 227 lb 6.4 oz (103.1 kg)   SpO2 98%   BMI 40.28 kg/m                 Constitutional: Pt appears in NAD               HENT: Head: NCAT.                Right Ear: External ear normal.                 Left Ear: External ear normal.                Eyes: . Pupils are equal, round, and reactive to light. Conjunctivae and EOM are normal               Nose: without d/c or deformity               Neck: Neck supple. Gross normal ROM               Cardiovascular: Normal rate and regular rhythm.                 Pulmonary/Chest: Effort normal and breath sounds without rales or wheezing.  Abd:  Soft, NT, ND, + BS, no organomegaly               Neurological: Pt is alert. At baseline orientation, motor grossly intact               Skin: Skin is warm. No rashes, no other new lesions, LE edema - none               Psychiatric: Pt behavior is  normal without agitation   Micro: none  Cardiac tracings I have personally interpreted today:  none  Pertinent Radiological findings (summarize): none   Lab Results  Component Value Date   WBC 10.7 (H) 01/14/2021   HGB 12.7 01/14/2021   HCT 38.6 01/14/2021   PLT 349.0 01/14/2021   GLUCOSE 91 07/14/2021   CHOL 198 07/14/2021   TRIG 114.0 07/14/2021   HDL 68.30 07/14/2021   LDLDIRECT 93.0 01/14/2021   LDLCALC 107 (H) 07/14/2021   ALT 19 07/14/2021   AST 15 07/14/2021   NA 140 07/14/2021   K 3.9 07/14/2021   CL 100 07/14/2021   CREATININE 1.34 (H) 07/14/2021   BUN 30 (H) 07/14/2021   CO2 30 07/14/2021   TSH 0.57 01/14/2021   INR 1.2 08/13/2020   HGBA1C 6.6 (H) 07/14/2021   Assessment/Plan:  [name] is a 75 y.o. Black or African American [2] female with  has a past medical history of Allergic rhinitis, cause unspecified, Allergy, Anemia, Cataract, CHF (congestive heart failure) ([place]), Chronic headaches, Constipation, Diverticulosis, DJD (degenerative joint disease), lumbar, GERD (gastroesophageal reflux disease), Hemorrhoids, Hiatal hernia, HTN (hypertension), Hyperlipidemia, Obesity, Osteoarthritis, and Tubular adenoma of colon.  Essential (primary) hypertension BP Readings from Last 3 Encounters:  07/14/21 132/82  05/29/21 126/82  05/15/21 (!) 144/78   Stable, pt to continue medical treatment dilt, lopressor, losartan   Mixed hyperlipidemia Lab Results  Component Value Date   LDLCALC 107 (H) 07/14/2021   Uncontrolled, goal ldl < 70,, pt to increase lipitor to 40 mg   Hyperglycemia Lab Results  Component Value Date   HGBA1C 6.6 (H) 07/14/2021   Stable, pt to continue current medical treatment  - diet   CKD (chronic kidney disease) stage 3, GFR 30-59 ml/min (HCC) Lab Results  Component Value Date   CREATININE 1.34 (H) 07/14/2021   Stable overall, cont to avoid nephrotoxins   Vitamin D deficiency Last vitamin D Lab Results  Component  Value Date   VD25OH 33.18 07/14/2021   Low, to start oral replacement  Followup: No follow-ups on file.  [name], MD 07/15/2021 9:14 PM [facility]

## 2021-07-15 NOTE — Assessment & Plan Note (Signed)
BP Readings from Last 3 Encounters:  07/14/21 132/82  05/29/21 126/82  05/15/21 (!) 144/78   Stable, pt to continue medical treatment dilt, lopressor, losartan

## 2021-07-15 NOTE — Assessment & Plan Note (Signed)
Last vitamin D Lab Results  Component Value Date   VD25OH 33.18 07/14/2021   Low, to start oral replacement

## 2021-07-15 NOTE — Assessment & Plan Note (Signed)
Lab Results  Component Value Date   HGBA1C 6.6 (H) 07/14/2021   Stable, pt to continue current medical treatment  - diet

## 2021-07-15 NOTE — Assessment & Plan Note (Addendum)
Lab Results  Component Value Date   LDLCALC 107 (H) 07/14/2021   Uncontrolled, goal ldl < 70,, pt to increase lipitor to 40 mg

## 2021-07-15 NOTE — Assessment & Plan Note (Signed)
Lab Results  Component Value Date   CREATININE 1.34 (H) 07/14/2021   Stable overall, cont to avoid nephrotoxins

## 2021-07-16 ENCOUNTER — Telehealth: Payer: Self-pay | Admitting: Internal Medicine

## 2021-07-16 MED ORDER — ATORVASTATIN CALCIUM 40 MG PO TABS
40.0000 mg | ORAL_TABLET | Freq: Every day | ORAL | 3 refills | Status: DC
Start: 2021-07-16 — End: 2022-08-28

## 2021-07-16 NOTE — Telephone Encounter (Signed)
Lipitor has been sent to [facility].

## 2021-07-16 NOTE — Telephone Encounter (Signed)
Patient requesting to send Lipitor to [facility] instead of [facility]

## 2021-07-17 ENCOUNTER — Ambulatory Visit: Payer: Medicare HMO

## 2021-07-17 ENCOUNTER — Ambulatory Visit: Payer: Medicare HMO | Admitting: Vascular Surgery

## 2021-07-17 ENCOUNTER — Encounter (HOSPITAL_COMMUNITY): Payer: Medicare HMO

## 2021-07-17 NOTE — Progress Notes (Unsigned)
Chronic Care Management Pharmacy Note  07/17/2021 Name:  [name] MRN:  [id number] DOB:  [birth year]  Summary: ***  Recommendations/Changes made from today's visit: ***  Plan: ***  Subjective: [name] is an 75 y.o. year old female who is a primary patient of Jenny Reichmann, Hunt Oris, MD.  The CCM team was consulted for assistance with disease management and care coordination needs.    Engaged with patient face to face for initial visit in response to provider referral for pharmacy case management and/or care coordination services.   Consent to Services:  The patient was given the following information about Chronic Care Management services today, agreed to services, and gave verbal consent: 1. CCM service includes personalized support from designated clinical staff supervised by the primary care provider, including individualized plan of care and coordination with other care providers 2. 24/7 contact phone numbers for assistance for urgent and routine care needs. 3. Service will only be billed when office clinical staff spend 20 minutes or more in a month to coordinate care. 4. Only one practitioner may furnish and bill the service in a calendar month. 5.The patient may stop CCM services at any time (effective at the end of the month) by phone call to the office staff. 6. The patient will be responsible for cost sharing (co-pay) of up to 20% of the service fee (after annual deductible is met). Patient agreed to services and consent obtained.  Patient Care Team: Biagio Borg, MD as PCP - General (Internal Medicine) Delice Bison Darnelle Maffucci, [facility] as Pharmacist (Pharmacist)  Recent office visits:  07/14/21 [name] MD PCP- pt seen for f/u (HTN, HLD, hyperglycemia, CKD, and low vit D - atorvastatin increased to 18m daily, start vitamin D supplementation, flu shot given   05/13/21 JCathlean CowerMD PCP- pt was seen for left leg pain and swelling. LLE venous doppler  was ordered and  Doxycycline Hyclate 100 mg BID for cellulitis.   01/14/21 JCathlean CowerMD- pt was seen for leg and joint swelling. Labs were ordered and pt started on Doxycycline 100 mg BID  for cellulitis. Follow up 6 months.   Recent consult visits:  05/29/21 CBaird LyonsMD Pulm. - pt was seen for f/u. No labs or med changes. Follow up in 1 year.   05/22/21 DLenord Carbo pt was seen for Spinal stenosis. Unable to view documentation.   02/18/21 JLajuan Lines Pyrtle MD GGertie Fey pt was seen for GERD.  A referral was placed for Gastro. Discontinued nexium, started pantoprazole, endoscopy and colonoscopy scheduled    Hospital visits:  None in previous 6 months  Objective:  Lab Results  Component Value Date   CREATININE 1.34 (H) 07/14/2021   BUN 30 (H) 07/14/2021   GFR 39.08 (L) 07/14/2021   GFRNONAA >60 08/22/2020   GFRAA >60 12/18/2019   NA 140 07/14/2021   K 3.9 07/14/2021   CALCIUM 9.6 07/14/2021   CO2 30 07/14/2021   GLUCOSE 91 07/14/2021    Lab Results  Component Value Date/Time   HGBA1C 6.6 (H) 07/14/2021 12:07 PM   HGBA1C 7.0 (H) 01/14/2021 02:50 PM   GFR 39.08 (L) 07/14/2021 12:07 PM   GFR 51.37 (L) 01/14/2021 02:50 PM    Last diabetic Eye exam:  No results found for: HMDIABEYEEXA  Last diabetic Foot exam:  No results found for: HMDIABFOOTEX   Lab Results  Component Value Date   CHOL 198 07/14/2021   HDL 68.30 07/14/2021   LDLCALC 107 (H) 07/14/2021  LDLDIRECT 93.0 01/14/2021   TRIG 114.0 07/14/2021   CHOLHDL 3 07/14/2021    Hepatic Function Latest Ref Rng & Units 07/14/2021 01/14/2021 09/20/2020  Total Protein 6.0 - 8.3 g/dL 7.2 7.5 7.2  Albumin 3.5 - 5.2 g/dL 4.0 4.2 -  AST 0 - 37 U/L 15 14 15   ALT 0 - 35 U/L 19 13 10   Alk Phosphatase 39 - 117 U/L 119(H) 123(H) -  Total Bilirubin 0.2 - 1.2 mg/dL 0.3 0.4 0.5  Bilirubin, Direct 0.0 - 0.3 mg/dL 0.1 0.1 0.1    Lab Results  Component Value Date/Time   TSH 0.57 01/14/2021 02:50 PM   TSH 0.96 02/06/2020 09:56 AM    CBC Latest Ref  Rng & Units 01/14/2021 09/20/2020 08/23/2020  WBC 4.0 - 10.5 K/uL 10.7(H) 12.3(H) 12.7(H)  Hemoglobin 12.0 - 15.0 g/dL 12.7 11.0(L) 8.1(L)  Hematocrit 36.0 - 46.0 % 38.6 34.9(L) 26.8(L)  Platelets 150.0 - 400.0 K/uL 349.0 409(H) 475(H)    Lab Results  Component Value Date/Time   VD25OH 33.18 07/14/2021 12:07 PM   VD25OH 33.98 01/14/2021 02:50 PM    Clinical ASCVD: {YES/NO:21197} The 10-year ASCVD risk score (Arnett DK, et al., 2019) is: 16.2%   Values used to calculate the score:     Age: 64 years     Sex: Female     Is Non-Hispanic African American: Yes     Diabetic: No     Tobacco smoker: No     Systolic Blood Pressure: 202 mmHg     Is BP treated: Yes     HDL Cholesterol: 68.3 mg/dL     Total Cholesterol: 198 mg/dL    Depression screen [facility] 2/9 05/13/2021 09/09/2020 08/07/2020  Decreased Interest 0 0 0  Down, Depressed, Hopeless 0 1 1  PHQ - 2 Score 0 1 1  Altered sleeping - - -  Tired, decreased energy - - -  Change in appetite - - -  Feeling bad or failure about yourself  - - -  Trouble concentrating - - -  Moving slowly or fidgety/restless - - -  Suicidal thoughts - - -  PHQ-9 Score - - -  Difficult doing work/chores - - -  Some recent data might be hidden     ***Other: (CHADS2VASc if Afib, MMRC or CAT for COPD, ACT, DEXA)  Social History   Tobacco Use  Smoking Status Never   Passive exposure: Never  Smokeless Tobacco Never   BP Readings from Last 3 Encounters:  07/14/21 132/82  05/29/21 126/82  05/15/21 (!) 144/78   Pulse Readings from Last 3 Encounters:  07/14/21 68  05/29/21 60  05/15/21 68   Wt Readings from Last 3 Encounters:  07/14/21 227 lb 6.4 oz (103.1 kg)  05/29/21 227 lb 9.6 oz (103.2 kg)  05/15/21 223 lb (101.2 kg)   BMI Readings from Last 3 Encounters:  07/14/21 40.28 kg/m  05/29/21 40.32 kg/m  05/15/21 39.50 kg/m    Assessment/Interventions: Review of patient past medical history, allergies, medications, health status, including  review of consultants reports, laboratory and other test data, was performed as part of comprehensive evaluation and provision of chronic care management services.   SDOH:  (Social Determinants of Health) assessments and interventions performed: {yes/no:20286}  SDOH Screenings   Alcohol Screen: Low Risk    Last Alcohol Screening Score (AUDIT): 0  Depression (PHQ2-9): Low Risk    PHQ-2 Score: 0  Financial Resource Strain: Low Risk    Difficulty of Paying Living Expenses: Not  hard at all  Food Insecurity: No Food Insecurity   Worried About Charity fundraiser in the Last Year: Never true   Ran Out of Food in the Last Year: Never true  Housing: Low Risk    Last Housing Risk Score: 0  Physical Activity: Inactive   Days of Exercise per Week: 0 days   Minutes of Exercise per Session: 0 min  Social Connections: Socially Isolated   Frequency of Communication with Friends and Family: More than three times a week   Frequency of Social Gatherings with Friends and Family: Once a week   Attends Religious Services: Never   Marine scientist or Organizations: No   Attends Music therapist: Not on file   Marital Status: Never married  Stress: No Stress Concern Present   Feeling of Stress : Not at all  Tobacco Use: Low Risk    Smoking Tobacco Use: Never   Smokeless Tobacco Use: Never  Transportation Needs: No Transportation Needs   Lack of Transportation (Medical): No   Lack of Transportation (Non-Medical): No    CCM Care Plan  Allergies  Allergen Reactions   Oxycodone    Soybean-Containing Drug Products Other (See Comments)    Allergy testing positive for soy beans but pt uses soy sauce   Sulfonamide Derivatives Itching and Rash    Medications Reviewed Today     Reviewed by Biagio Borg, MD (Physician) on 07/15/21 at 2113  Med List Status: <None>   Medication Order Taking? Sig Documenting Provider Last Dose Status Informant  Discontinued 07/14/21 1155    atorvastatin (LIPITOR) 40 MG tablet [id number]  Take 1 tablet (40 mg total) by mouth daily. Biagio Borg, MD  Active   azelastine (ASTELIN) 0.1 % nasal spray [id number] Yes Place 1 spray into both nostrils 2 (two) times daily. Use in each nostril as directed Baird Lyons D, MD Taking Active   cetirizine (ZYRTEC) 10 MG tablet [id number] Yes Take 1 tablet (10 mg total) by mouth daily. As needed  Patient taking differently: Take 10 mg by mouth daily as needed for allergies. As needed   Biagio Borg, MD Taking Active Self  Cholecalciferol 50 MCG ([phone]) TABS [id number] Yes 1 tab by mouth once daily Biagio Borg, MD  Active   diltiazem (DILT-XR) 240 MG 24 hr capsule [id number] Yes Take 1 capsule (240 mg total) by mouth daily. Biagio Borg, MD Taking Active   doxycycline (VIBRA-TABS) 100 MG tablet [id number] Yes Take 1 tablet (100 mg total) by mouth 2 (two) times daily. Biagio Borg, MD Taking Active   DULoxetine (CYMBALTA) 20 MG capsule [id number] Yes TAKE 2 CAPSULES(40 MG) BY MOUTH DAILY Biagio Borg, MD Taking Active   ferrous sulfate 325 (65 FE) MG tablet [id number] Yes Take 1 tablet (325 mg total) by mouth 2 (two) times daily with a meal. Kc, Ramesh, MD Taking Active   furosemide (LASIX) 80 MG tablet [id number] Yes Take 1 tablet (80 mg total) by mouth 2 (two) times daily. Biagio Borg, MD Taking Active   gabapentin (NEURONTIN) 100 MG capsule [id number] Yes TAKE 2 CAPSULES BY MOUTH AT BEDTIME Biagio Borg, MD Taking Active   HYDROcodone-acetaminophen (NORCO/VICODIN) 5-325 MG tablet [id number] Yes Take 1 tablet by mouth as needed. [provider] Taking Active   hydrOXYzine (ATARAX/VISTARIL) 10 MG tablet [id number] Yes Take 1 tablet (10 mg total) by mouth every 8 (eight) hours as needed for itching. Biagio Borg, MD  Taking Active   losartan (COZAAR) 100 MG tablet [id number] Yes TAKE 1 TABLET EVERY DAY Biagio Borg, MD Taking Active   metoprolol tartrate (LOPRESSOR) 50 MG tablet [id number] Yes  Take 1 tablet (50 mg total) by mouth 2 (two) times daily. Biagio Borg, MD Taking Active   pantoprazole (PROTONIX) 40 MG tablet [id number] Yes TAKE 1 TABLET(40 MG) BY MOUTH DAILY Danis, Kirke Corin, MD Taking Active   Polyethyl Glycol-Propyl Glycol (SYSTANE OP) [id number] Yes Place 1 drop into both eyes 3 (three) times daily as needed (dry eyes). [provider] Taking Active   Polyethylene Glycol 3350 (MIRALAX PO) [id number] Yes Take 17 g by mouth daily as needed (constipation).  [provider] Taking Active Self  potassium chloride (KLOR-CON) 10 MEQ tablet [id number] Yes TAKE 1 TABLET(10 MEQ) BY MOUTH DAILY Biagio Borg, MD Taking Active   Prednisol Ace-Moxiflox-Bromfen 1-0.5-0.075 % SUSP [id number] Yes Place 1 drop into the right eye 4 (four) times daily. [provider] Taking Active   tiZANidine (ZANAFLEX) 2 MG tablet [id number] Yes Take 1 tablet (2 mg total) by mouth every 6 (six) hours as needed for muscle spasms. Biagio Borg, MD Taking Active   traMADol Veatrice Bourbon) 50 MG tablet [id number] Yes Take 1-2 tablets (50-100 mg total) by mouth every 6 (six) hours as needed for up to 30 doses for moderate pain. Biagio Borg, MD Taking Active   triamcinolone cream (KENALOG) 0.1 % [id number] Yes Apply 1 application topically 2 (two) times daily. Marrian Salvage, FNP Taking Active Self            Patient Active Problem List   Diagnosis Date Noted   CKD (chronic kidney disease) stage 3, GFR 30-59 ml/min ([place]) 07/14/2021   Vitamin D deficiency 07/14/2021   Pain and swelling of lower leg, left 05/15/2021   Cellulitis of lower leg 01/14/2021   Sepsis due to cellulitis ([place]) 08/13/2020   Cellulitis of right lower extremity 08/13/2020   Injury of back due to fall 08/13/2020   Microcytic anemia 08/13/2020   Lactic acidosis 08/13/2020   Sepsis ([place]) 08/13/2020   Aortic atherosclerosis ([place]) 08/07/2020   Itching 08/07/2020   Leg skin lesion, left 08/07/2020   Cellulitis  of left leg 01/11/2020   Pulmonary embolism ([place]) 12/28/2019   Leg wound, left 11/12/2019   Insomnia 09/23/2019   Peripheral edema 07/20/2019   Primary osteoarthritis of right knee 04/14/2019   Hyperglycemia 04/13/2018   Acute bursitis of right shoulder 08/16/2017   Abnormal CXR 07/26/2017   Unexplained night sweats 07/26/2017   Greater trochanteric bursitis of right hip 06/23/2017   Sweating abnormality 03/09/2017   Gingivitis 03/09/2017   Herpes zoster without complication 02/54/2706   Trapezoid ligament sprain, right, initial encounter 12/11/2016   Fasciculation 12/11/2016   Acute bronchitis 11/16/2016   Degenerative joint disease of left shoulder 10/07/2016   Degenerative arthritis of knee, bilateral 09/16/2016   Lumbar stenosis with neurogenic claudication 06/17/2016   Compression fracture of L1 lumbar vertebra ([place]) 03/31/2016   Pelvic mass in female 03/31/2016   Urinary frequency 02/27/2016   Chronic maxillary sinusitis 06/06/2015   Carpal tunnel syndrome 09/18/2014   Sprain of ankle, unspecified site 07/04/2014   Primary localized osteoarthrosis, lower leg 05/21/2014   Left knee pain 03/15/2014   Food allergy 10/05/2013   Eustachian tube dysfunction, left 04/24/2013   Degenerative arthritis of left knee 04/24/2013   Hot flashes 04/24/2013   External otitis of left ear 12/15/2012   Spinal  stenosis of lumbar region 04/25/2012   Seasonal and perennial allergic rhinitis 03/18/2012   Hearing loss in right ear 03/18/2012   Esophageal dysphagia 02/11/2012   Dysphagia 01/20/2012   Rash 01/20/2012   Bilateral knee pain 01/20/2012   Encounter for well adult exam with abnormal findings 01/14/2012   GERD (gastroesophageal reflux disease) 01/14/2012   Essential (primary) hypertension 01/14/2012   Mixed hyperlipidemia 01/14/2012   Body mass index (BMI) 39.0-39.9, adult 01/14/2012   Osteoarthritis 01/14/2012   DJD (degenerative joint disease), lumbar 01/14/2012   Palpitations  11/21/2010   Dyspnea on exertion 11/21/2010    Immunization History  Administered Date(s) Administered   Fluad Quad(high Dose 65+) 07/12/2020, 07/14/2021   Influenza Split 09/30/2012   Influenza, High Dose Seasonal PF 08/01/2013, 08/11/2016, 07/26/2017, 06/24/2018, 07/11/2019   Influenza,inj,Quad PF,6+ Mos 07/26/2015   Moderna Sars-Covid-2 Vaccination 11/25/2019, 12/18/2019, 08/05/2020   Pneumococcal Conjugate-13 03/15/2014   Pneumococcal Polysaccharide-23 01/30/2013   Tdap 01/18/1999, 04/13/2018    Conditions to be addressed/monitored:  {USCCMDZASSESSMENTOPTIONS:23563}  There are no care plans that you recently modified to display for this patient.     Medication Assistance: {MEDASSISTANCEINFO:25044}  Compliance/Adherence/Medication fill history: Care Gaps: ***  Patient's preferred pharmacy is:  [facility] [phone] - [place], [address] [place] [address] Phone: [phone] Fax: [phone]  [facility] [phone] - [place], [place] - [address] [place] [address] Phone: [phone] Fax: [phone]  [place], [address] [place] [address] Phone: [phone] Fax: [phone]   Uses pill box? {Yes or If no, why not?:20788} Pt endorses ***% compliance  Care Plan and Follow Up Patient Decision:  {FOLLOWUP:24991}  Plan: {CM FOLLOW UP HYWV:37106}  ***  Current Barriers:  {pharmacybarriers:24917}  Pharmacist Clinical Goal(s):  Patient will {PHARMACYGOALCHOICES:24921} through collaboration with PharmD and provider.   Interventions: 1:1 collaboration with Biagio Borg, MD regarding development and update of comprehensive plan of care as evidenced by provider attestation and co-signature Inter-disciplinary care team  collaboration (see longitudinal plan of care) Comprehensive medication review performed; medication list updated in electronic medical record  Hypertension (BP goal <130/80) -Controlled -Current treatment: Losartan 182m - 1 tablet daily  Metoprolol Tartrate 598m- 1 tablet twice daily  Furosemide 8019m 1 tablet twice daily Potassium Chloride 62m56m 1 tablet daily  Lab Results  Component Value Date   K 3.9 07/14/2021  Diltiazem XR 240mg38m capsule daily  -Medications previously tried: hctz  -Current home readings: *** -Current dietary habits: *** -Current exercise habits: *** -{ACTIONS;DENIES/REPORTS:21021675::"Denies"} hypotensive/hypertensive symptoms -Educated on {CCM BP Counseling:25124} -Counseled to monitor BP at home ***, document, and provide log at future appointments -{CCMPHARMDINTERVENTION:25122}  Hyperlipidemia: (LDL goal < 100) -{US controlled/uncontrolled:25276} Lab Results  Component Value Date   LDLCALC 107 (H) 07/14/2021  -Current treatment: Atorvastatin 40mg 29mtablet daily (started with last PCP appointment - LDL has not been checked since increase) -Medications previously tried: atorvastatin 20mg d67m   -Current dietary patterns: *** -Current exercise habits: *** -Educated on {CCM HLD Counseling:25126} -{CCMPHARMDINTERVENTION:25122}  Diabetes (A1c goal <6.5%) -{US controlled/uncontrolled:25276} Lab Results  Component Value Date   HGBA1C 6.6 (H) 07/14/2021  -Current medications: N/a - diet controlled at this time  -Medications previously tried: n/a  -Current home glucose readings fasting glucose: *** post prandial glucose: *** -{ACTIONS;DENIES/REPORTS:21021675::"Denies"} hypoglycemic/hyperglycemic symptoms -Current meal  patterns:  breakfast: ***  lunch: ***  dinner: *** snacks: *** drinks: *** -Current exercise: *** -Educated on {CCM DM COUNSELING:25123} -Counseled to check feet daily and get yearly eye  exams -{CCMPHARMDINTERVENTION:25122}  Chronic Pain/ Spinal Stenosis / OA (Goal: Pain control) -Follows with Dr. Simeon Craft -{US controlled/uncontrolled:25276} -Current treatment  Hydrocodone/APAP 5/397m - 1 tablet every 8 hours as needed  Tizanidine 269m- 1 tablet every 6 hours as needed  Duloxetine 207m 2 capsules daily  Gabapentin 100m33m2 capsules at bedtime  -Medications previously tried: tramadol, diclofenac, meloxicam, naproxen, percocet,   -{CCMPHARMDINTERVENTION:25122}  GERD (Goal: Prevention/ control of acid reflux) -{US controlled/uncontrolled:25276} -Current treatment  Pantoprazole 40mg31m tablet daily  -Medications previously tried: esomeprazole, omeprazole, sucralfate, ranitidine  -{CCMPHARMDINTERVENTION:25122}  Allergic Rhinitis (Goal: Prevention/ control of allergies) -{US controlled/uncontrolled:25276} -Current treatment  Cetirizine 10mg 36mtablet daily as neded  Azelastine 0.1% nasal spray - 1 spray into each nostril 2 times daily as needed  -Medications previously tried: flonase, levocetirizine   -{CCMPHARMDINTERVENTION:25122}  Chronic Kidney Disease (Goal: Prevention of disease progression) -{US controlled/uncontrolled:25276} -Last eGFR - 39.08mL/m79mLast Scr - 1.34 mg/dL -Last CrCl - 42.3mL/min57mCurrent treatment  *** -Medications previously tried: ***  -{CCMPHARMDINTERVENTION:25122}   Health Maintenance -Vaccine gaps: *** -Current therapy:  Systane Eye drops  - 1 drop into each eye 3 times daily as needed  Triamcinolone 0.1% cream - applied 2 times daily  Ferrous Sulfate 325 (65 FE)mg - 1 tablet daily  Vitamin D 2000 units -1 tablet daily  Miralax - 17g daily as needed  -Educated on {ccm supplement counseling:25128} -{CCM Patient satisfied:25129} -{CCMPHARMDINTERVENTION:25122}  Patient Goals/Self-Care Activities Patient will:  - {pharmacypatientgoals:24919}  Follow Up Plan: {CM FOLLOW UP PLAN:222SAYT:01601}t Chart Prep = 25  minutes

## 2021-07-21 ENCOUNTER — Ambulatory Visit (INDEPENDENT_AMBULATORY_CARE_PROVIDER_SITE_OTHER): Payer: Medicare HMO

## 2021-07-21 ENCOUNTER — Other Ambulatory Visit: Payer: Self-pay

## 2021-07-21 DIAGNOSIS — N1831 Chronic kidney disease, stage 3a: Secondary | ICD-10-CM

## 2021-07-21 DIAGNOSIS — E782 Mixed hyperlipidemia: Secondary | ICD-10-CM

## 2021-07-21 DIAGNOSIS — I10 Essential (primary) hypertension: Secondary | ICD-10-CM

## 2021-07-21 DIAGNOSIS — R739 Hyperglycemia, unspecified: Secondary | ICD-10-CM

## 2021-07-21 NOTE — Progress Notes (Signed)
Chronic Care Management Pharmacy Note  07/21/2021 Name:  [name] MRN:  [id number] DOB:  02-Jul-1946  Summary: -Patient reports that she has been doing well since most recent increase in atorvastatin, denies and AE or concerns -BP has been well controlled at most recent office visits, patient does not have a blood pressure cuff at home to be able to monitor at home  -Notes to occasional orthostatic hypotension that will subside a few moments later after standing  Recommendations/Changes made from today's visit: -Recommending no changes to medications at this time, reviewed with patient prediabetes and concern about elevated A1c at 6.6% - advised for patient to moderate carbohydrate intake, increase protein and vegetable intake   Subjective: [name] is an 75 y.o. year old female who is a primary patient of [name], Hunt Oris, MD.  The CCM team was consulted for assistance with disease management and care coordination needs.    Engaged with patient face to face for initial visit in response to provider referral for pharmacy case management and/or care coordination services.   Consent to Services:  The patient was given the following information about Chronic Care Management services today, agreed to services, and gave verbal consent: 1. CCM service includes personalized support from designated clinical staff supervised by the primary care provider, including individualized plan of care and coordination with other care providers 2. 24/7 contact phone numbers for assistance for urgent and routine care needs. 3. Service will only be billed when office clinical staff spend 20 minutes or more in a month to coordinate care. 4. Only one practitioner may furnish and bill the service in a calendar month. 5.The patient may stop CCM services at any time (effective at the end of the month) by phone call to the office staff. 6. The patient will be responsible for cost sharing (co-pay) of up  to 20% of the service fee (after annual deductible is met). Patient agreed to services and consent obtained.  Patient Care Team: Biagio Borg, MD as PCP - General (Internal Medicine) Delice Bison Darnelle Maffucci, [facility] as Pharmacist (Pharmacist)  Recent office visits:  07/14/21 [name] MD PCP- pt seen for f/u (HTN, HLD, hyperglycemia, CKD, and low vit D - atorvastatin increased to 58m daily, start vitamin D supplementation, flu shot given   05/13/21 JCathlean CowerMD PCP- pt was seen for left leg pain and swelling. LLE venous doppler  was ordered and Doxycycline Hyclate 100 mg BID for cellulitis.   01/14/21 JCathlean CowerMD PCP- pt was seen for leg and joint swelling. Labs were ordered and pt started on Doxycycline 100 mg BID  for cellulitis. Follow up 6 months.   Recent consult visits:  05/29/21 CBaird LyonsMD Pulm. - pt was seen for f/u. No labs or med changes. Follow up in 1 year.   05/22/21 DLenord Carbo pt was seen for Spinal stenosis. Unable to view documentation.   02/18/21 JLajuan Lines Pyrtle MD GGertie Fey pt was seen for GERD.  A referral was placed for Gastro. Discontinued nexium, started pantoprazole, endoscopy and colonoscopy scheduled    Hospital visits:  None in previous 6 months  Objective:  Lab Results  Component Value Date   CREATININE 1.34 (H) 07/14/2021   BUN 30 (H) 07/14/2021   GFR 39.08 (L) 07/14/2021   GFRNONAA >60 08/22/2020   GFRAA >60 12/18/2019   NA 140 07/14/2021   K 3.9 07/14/2021   CALCIUM 9.6 07/14/2021   CO2 30 07/14/2021   GLUCOSE 91 07/14/2021  Lab Results  Component Value Date/Time   HGBA1C 6.6 (H) 07/14/2021 12:07 PM   HGBA1C 7.0 (H) 01/14/2021 02:50 PM   GFR 39.08 (L) 07/14/2021 12:07 PM   GFR 51.37 (L) 01/14/2021 02:50 PM    Last diabetic Eye exam:  No results found for: HMDIABEYEEXA  Last diabetic Foot exam:  No results found for: HMDIABFOOTEX   Lab Results  Component Value Date   CHOL 198 07/14/2021   HDL 68.30 07/14/2021   LDLCALC 107 (H) 07/14/2021    LDLDIRECT 93.0 01/14/2021   TRIG 114.0 07/14/2021   CHOLHDL 3 07/14/2021    Hepatic Function Latest Ref Rng & Units 07/14/2021 01/14/2021 09/20/2020  Total Protein 6.0 - 8.3 g/dL 7.2 7.5 7.2  Albumin 3.5 - 5.2 g/dL 4.0 4.2 -  AST 0 - 37 U/L 15 14 15   ALT 0 - 35 U/L 19 13 10   Alk Phosphatase 39 - 117 U/L 119(H) 123(H) -  Total Bilirubin 0.2 - 1.2 mg/dL 0.3 0.4 0.5  Bilirubin, Direct 0.0 - 0.3 mg/dL 0.1 0.1 0.1    Lab Results  Component Value Date/Time   TSH 0.57 01/14/2021 02:50 PM   TSH 0.96 02/06/2020 09:56 AM    CBC Latest Ref Rng & Units 01/14/2021 09/20/2020 08/23/2020  WBC 4.0 - 10.5 K/uL 10.7(H) 12.3(H) 12.7(H)  Hemoglobin 12.0 - 15.0 g/dL 12.7 11.0(L) 8.1(L)  Hematocrit 36.0 - 46.0 % 38.6 34.9(L) 26.8(L)  Platelets 150.0 - 400.0 K/uL 349.0 409(H) 475(H)    Lab Results  Component Value Date/Time   VD25OH 33.18 07/14/2021 12:07 PM   VD25OH 33.98 01/14/2021 02:50 PM    Clinical ASCVD: No  The 10-year ASCVD risk score (Arnett DK, et al., 2019) is: 16.2%   Values used to calculate the score:     Age: 75 years     Sex: Female     Is Non-Hispanic African American: Yes     Diabetic: No     Tobacco smoker: No     Systolic Blood Pressure: 453 mmHg     Is BP treated: Yes     HDL Cholesterol: 68.3 mg/dL     Total Cholesterol: 198 mg/dL    Depression screen [facility] 2/9 05/13/2021 09/09/2020 08/07/2020  Decreased Interest 0 0 0  Down, Depressed, Hopeless 0 1 1  PHQ - 2 Score 0 1 1  Altered sleeping - - -  Tired, decreased energy - - -  Change in appetite - - -  Feeling bad or failure about yourself  - - -  Trouble concentrating - - -  Moving slowly or fidgety/restless - - -  Suicidal thoughts - - -  PHQ-9 Score - - -  Difficult doing work/chores - - -  Some recent data might be hidden    Social History   Tobacco Use  Smoking Status Never   Passive exposure: Never  Smokeless Tobacco Never   BP Readings from Last 3 Encounters:  07/14/21 132/82  05/29/21 126/82   05/15/21 (!) 144/78   Pulse Readings from Last 3 Encounters:  07/14/21 68  05/29/21 60  05/15/21 68   Wt Readings from Last 3 Encounters:  07/14/21 227 lb 6.4 oz (103.1 kg)  05/29/21 227 lb 9.6 oz (103.2 kg)  05/15/21 223 lb (101.2 kg)   BMI Readings from Last 3 Encounters:  07/14/21 40.28 kg/m  05/29/21 40.32 kg/m  05/15/21 39.50 kg/m    Assessment/Interventions: Review of patient past medical history, allergies, medications, health status, including review of consultants reports,  laboratory and other test data, was performed as part of comprehensive evaluation and provision of chronic care management services.   SDOH:  (Social Determinants of Health) assessments and interventions performed: Yes  SDOH Screenings   Alcohol Screen: Low Risk    Last Alcohol Screening Score (AUDIT): 0  Depression (PHQ2-9): Low Risk    PHQ-2 Score: 0  Financial Resource Strain: Low Risk    Difficulty of Paying Living Expenses: Not hard at all  Food Insecurity: No Food Insecurity   Worried About Charity fundraiser in the Last Year: Never true   Ran Out of Food in the Last Year: Never true  Housing: Low Risk    Last Housing Risk Score: 0  Physical Activity: Inactive   Days of Exercise per Week: 0 days   Minutes of Exercise per Session: 0 min  Social Connections: Socially Isolated   Frequency of Communication with Friends and Family: More than three times a week   Frequency of Social Gatherings with Friends and Family: Once a week   Attends Religious Services: Never   Marine scientist or Organizations: No   Attends Music therapist: Not on file   Marital Status: Never married  Stress: No Stress Concern Present   Feeling of Stress : Not at all  Tobacco Use: Low Risk    Smoking Tobacco Use: Never   Smokeless Tobacco Use: Never  Transportation Needs: No Transportation Needs   Lack of Transportation (Medical): No   Lack of Transportation (Non-Medical): No    CCM  Care Plan  Allergies  Allergen Reactions   Oxycodone    Soybean-Containing Drug Products Other (See Comments)    Allergy testing positive for soy beans but pt uses soy sauce   Sulfonamide Derivatives Itching and Rash    Medications Reviewed Today     Reviewed by Tomasa Blase, [facility] (Pharmacist) on 07/21/21 at 1215  Med List Status: <None>   Medication Order Taking? Sig Documenting Provider Last Dose Status Informant  aspirin-acetaminophen-caffeine (EXCEDRIN MIGRAINE) 892-119-41 MG tablet [id number] Yes Take 1 tablet by mouth every 6 (six) hours as needed for headache. [provider] Taking Active   atorvastatin (LIPITOR) 40 MG tablet [id number] Yes Take 1 tablet (40 mg total) by mouth daily. Biagio Borg, MD Taking Active   azelastine (ASTELIN) 0.1 % nasal spray [id number] Yes Place 1 spray into both nostrils 2 (two) times daily. Use in each nostril as directed Baird Lyons D, MD Taking Active   cetirizine (ZYRTEC) 10 MG tablet [id number] Yes Take 1 tablet (10 mg total) by mouth daily. As needed  Patient taking differently: Take 10 mg by mouth daily as needed for allergies. As needed   Biagio Borg, MD Taking Active Self  Cholecalciferol 50 MCG [phone] UT) TABS [id number] No 1 tab by mouth once daily  Patient not taking: Reported on 07/21/2021   Biagio Borg, MD Not Taking Active   diltiazem (DILT-XR) 240 MG 24 hr capsule [id number] Yes Take 1 capsule (240 mg total) by mouth daily. Biagio Borg, MD Taking Active   DULoxetine (CYMBALTA) 20 MG capsule [id number] Yes TAKE 2 CAPSULES(40 MG) BY MOUTH DAILY Biagio Borg, MD Taking Active   ferrous sulfate 325 (65 FE) MG tablet [id number] Yes Take 1 tablet (325 mg total) by mouth 2 (two) times daily with a meal. Kc, Ramesh, MD Taking Active   furosemide (LASIX) 80 MG tablet [id number] Yes Take 1 tablet (80 mg total) by mouth  2 (two) times daily. Biagio Borg, MD Taking Active   gabapentin (NEURONTIN) 100 MG capsule [id number] Yes TAKE 2  CAPSULES BY MOUTH AT BEDTIME Biagio Borg, MD Taking Active   HYDROcodone-acetaminophen (NORCO/VICODIN) 5-325 MG tablet [id number] Yes Take 0.5 tablets by mouth daily as needed. [provider] Taking Active   hydrOXYzine (ATARAX/VISTARIL) 10 MG tablet [id number] Yes Take 1 tablet (10 mg total) by mouth every 8 (eight) hours as needed for itching. Biagio Borg, MD Taking Active   losartan (COZAAR) 100 MG tablet [id number] Yes TAKE 1 TABLET EVERY DAY Biagio Borg, MD Taking Active   metoprolol tartrate (LOPRESSOR) 50 MG tablet [id number] Yes Take 1 tablet (50 mg total) by mouth 2 (two) times daily. Biagio Borg, MD Taking Active   pantoprazole (PROTONIX) 40 MG tablet [id number] Yes TAKE 1 TABLET(40 MG) BY MOUTH DAILY Danis, Kirke Corin, MD Taking Active   Polyethyl Glycol-Propyl Glycol (SYSTANE OP) [id number] Yes Place 1 drop into both eyes 3 (three) times daily as needed (dry eyes). [provider] Taking Active   Polyethylene Glycol 3350 (MIRALAX PO) [id number] Yes Take 17 g by mouth daily as needed (constipation).  [provider] Taking Active Self  potassium chloride (KLOR-CON) 10 MEQ tablet [id number] Yes TAKE 1 TABLET(10 MEQ) BY MOUTH DAILY Biagio Borg, MD Taking Active   tiZANidine (ZANAFLEX) 2 MG tablet [id number] Yes Take 1 tablet (2 mg total) by mouth every 6 (six) hours as needed for muscle spasms. Biagio Borg, MD Taking Active   triamcinolone cream (KENALOG) 0.1 % [id number] Yes Apply 1 application topically 2 (two) times daily. Marrian Salvage, FNP Taking Active Self  vitamin B-12 (CYANOCOBALAMIN) 1000 MCG tablet [id number] Yes Take 1,000 mcg by mouth daily. [provider] Taking Active             Patient Active Problem List   Diagnosis Date Noted   CKD (chronic kidney disease) stage 3, GFR 30-59 ml/min ([place]) 07/14/2021   Vitamin D deficiency 07/14/2021   Pain and swelling of lower leg, left 05/15/2021   Cellulitis of lower leg 01/14/2021    Sepsis due to cellulitis ([place]) 08/13/2020   Cellulitis of right lower extremity 08/13/2020   Injury of back due to fall 08/13/2020   Microcytic anemia 08/13/2020   Lactic acidosis 08/13/2020   Sepsis ([place]) 08/13/2020   Aortic atherosclerosis ([place]) 08/07/2020   Itching 08/07/2020   Leg skin lesion, left 08/07/2020   Cellulitis of left leg 01/11/2020   Pulmonary embolism ([place]) 12/28/2019   Leg wound, left 11/12/2019   Insomnia 09/23/2019   Peripheral edema 07/20/2019   Primary osteoarthritis of right knee 04/14/2019   Hyperglycemia 04/13/2018   Acute bursitis of right shoulder 08/16/2017   Abnormal CXR 07/26/2017   Unexplained night sweats 07/26/2017   Greater trochanteric bursitis of right hip 06/23/2017   Sweating abnormality 03/09/2017   Gingivitis 03/09/2017   Herpes zoster without complication 33/61/2244   Trapezoid ligament sprain, right, initial encounter 12/11/2016   Fasciculation 12/11/2016   Acute bronchitis 11/16/2016   Degenerative joint disease of left shoulder 10/07/2016   Degenerative arthritis of knee, bilateral 09/16/2016   Lumbar stenosis with neurogenic claudication 06/17/2016   Compression fracture of L1 lumbar vertebra ([place]) 03/31/2016   Pelvic mass in female 03/31/2016   Urinary frequency 02/27/2016   Chronic maxillary sinusitis 06/06/2015   Carpal tunnel syndrome 09/18/2014   Sprain of ankle, unspecified site 07/04/2014   Primary localized osteoarthrosis, lower leg 05/21/2014  Left knee pain 03/15/2014   Food allergy 10/05/2013   Eustachian tube dysfunction, left 04/24/2013   Degenerative arthritis of left knee 04/24/2013   Hot flashes 04/24/2013   External otitis of left ear 12/15/2012   Spinal stenosis of lumbar region 04/25/2012   Seasonal and perennial allergic rhinitis 03/18/2012   Hearing loss in right ear 03/18/2012   Esophageal dysphagia 02/11/2012   Dysphagia 01/20/2012   Rash 01/20/2012   Bilateral knee pain 01/20/2012   Encounter  for well adult exam with abnormal findings 01/14/2012   GERD (gastroesophageal reflux disease) 01/14/2012   Essential (primary) hypertension 01/14/2012   Mixed hyperlipidemia 01/14/2012   Body mass index (BMI) 39.0-39.9, adult 01/14/2012   Osteoarthritis 01/14/2012   DJD (degenerative joint disease), lumbar 01/14/2012   Palpitations 11/21/2010   Dyspnea on exertion 11/21/2010    Immunization History  Administered Date(s) Administered   Fluad Quad(high Dose 65+) 07/12/2020, 07/14/2021   Influenza Split 09/30/2012   Influenza, High Dose Seasonal PF 08/01/2013, 08/11/2016, 07/26/2017, 06/24/2018, 07/11/2019   Influenza,inj,Quad PF,6+ Mos 07/26/2015   Moderna Sars-Covid-2 Vaccination 11/25/2019, 12/18/2019, 08/05/2020   Pneumococcal Conjugate-13 03/15/2014   Pneumococcal Polysaccharide-23 01/30/2013   Tdap 01/18/1999, 04/13/2018    Conditions to be addressed/monitored:  Hypertension, Hyperlipidemia, Prediabetes, Chronic Kidney Disease,GERD, and Chronic pain  Care Plan : CCM Care Plan  Updates made by Tomasa Blase, RPH since 07/21/2021 12:00 AM     Problem: Hypertension, Hyperlipidemia, Prediabetes, Chronic Kidney Disease,GERD, and Chronic pain   Priority: High  Onset Date: 07/21/2021     Long-Range Goal: Disease Management   Start Date: 07/21/2021  Expected End Date: 01/19/2022  This Visit's Progress: On track  Priority: High  Note:   Current Barriers:  Unable to independently monitor therapeutic efficacy  Pharmacist Clinical Goal(s):  Patient will achieve adherence to monitoring guidelines and medication adherence to achieve therapeutic efficacy through collaboration with PharmD and provider.   Interventions: 1:1 collaboration with Biagio Borg, MD regarding development and update of comprehensive plan of care as evidenced by provider attestation and co-signature Inter-disciplinary care team collaboration (see longitudinal plan of care) Comprehensive medication review  performed; medication list updated in electronic medical record  Hypertension (BP goal <130/80) -Controlled -Current treatment: Losartan 122m - 1 tablet daily  Metoprolol Tartrate 561m- 1 tablet twice daily  Furosemide 8042m 1 tablet twice daily -  Potassium Chloride 4m22m 1 tablet daily  Lab Results  Component Value Date   K 3.9 07/14/2021  Diltiazem XR 240mg14m capsule daily  -Medications previously tried: hctz  -Current home readings: n/a - does not have BP cuff at home  BP Readings from Last 3 Encounters:  07/14/21 132/82  05/29/21 126/82  05/15/21 (!) 144/78  -Current dietary habits: reports that she does not add salt to her food - notes that most of her meals are pre-made from meals on wheels  -Current exercise habits: none at this time  -Denies hypotensive/hypertensive symptoms -Educated on BP goals and benefits of medications for prevention of heart attack, stroke and kidney damage; Daily salt intake goal < 2300 mg; Exercise goal of 150 minutes per week; Importance of home blood pressure monitoring; Proper BP monitoring technique; Symptoms of hypotension and importance of maintaining adequate hydration; -Counseled to monitor BP at home weekly if able, document, and provide log at future appointments -Counseled on diet and exercise extensively Recommended to continue current medication  Hyperlipidemia: (LDL goal < 100) -Not ideally controlled Lab Results  Component Value Date  [place] 107 (H) 07/14/2021  -Current treatment: Atorvastatin 74m - 1 tablet daily (started with last PCP appointment - LDL has not been checked since increase) -Medications previously tried: atorvastatin 291mdaily   -Current dietary patterns: receives some of her meals from meals on wheel  -Current exercise habits: nothing at this time - [facility]  -Educated on Cholesterol goals;  Benefits of statin for ASCVD risk reduction; Importance  of limiting foods high in cholesterol; Exercise goal of 150 minutes per week; -Counseled on diet and exercise extensively  Prediabetes (A1c goal <6.5%) -Not ideally controlled Lab Results  Component Value Date   HGBA1C 6.6 (H) 07/14/2021  -Current medications: N/a - diet controlled at this time  -Medications previously tried: n/a  -Current home glucose readings fasting glucose: n/a - not currently checking  -Denies hypoglycemic/hyperglycemic symptoms -Current meal patterns:  breakfast: fruit, grits, cereal,   lunch: supplied by meals on wheels - sandwich, beans, vegetable  dinner: meals on wheels - spaghetti, chicken, broccoli, beans, salad snacks: "loves sweets" drinks: water, juice, ginger ale -Current exercise: none at this time, plans to restart going to [employer]/ using exercise bike in the near future  -Educated on A1c and blood sugar goals; Complications of diabetes including kidney damage, retinal damage, and cardiovascular disease; Exercise goal of 150 minutes per week; Benefits of weight loss; Carbohydrate counting and/or plate method -Counseled to check feet daily and get yearly eye exams -Counseled on diet and exercise extensively  Chronic Pain/ Spinal Stenosis / OA (Goal: Pain control) -Follows with Dr. SaSimeon CraftControlled -Current treatment  Hydrocodone/APAP 5/32597m 1 tablet every 8 hours as needed  Tizanidine 2mg39m1 tablet every 6 hours as needed  Duloxetine 20mg89m capsules daily  Gabapentin 100mg 32mcapsules at bedtime  -Medications previously tried: tramadol, diclofenac, meloxicam, naproxen, percocet,   -Recommended to continue current medication  GERD (Goal: Prevention/ control of acid reflux) -Controlled -Current treatment  Pantoprazole 40mg -2mablet daily  -Medications previously tried: esomeprazole, omeprazole, sucralfate, ranitidine  -Counseled on diet and exercise extensively Recommended to continue current medication  Allergic Rhinitis  (Goal: Prevention/ control of allergies) -Controlled -Current treatment  Cetirizine 10mg - 89mblet daily as neded  Azelastine 0.1% nasal spray - 1 spray into each nostril 2 times daily as needed  -Medications previously tried: flonase, levocetirizine   -Recommended to continue current medication  Chronic Kidney Disease (Goal: Prevention of disease progression) -Controlled/ stable -Last eGFR - 39.08mL/min60mst Scr - 1.34 mg/dL -Last CrCl - 42.3mL/min  61mrrent treatment  Avoidance of nephrotoxic agents / adequate BP and BG control to prevent kidney damage  -Medications previously tried: n/a  -Recommended to continue current medication   Health Maintenance -Vaccine gaps: up to date  -Current therapy:  Systane Eye drops  - 1 drop into each eye 3 times daily as needed  Vitamin B12 - 1000mcg dail54mxcedrin 250-250-65mg  - 1 t2mt every 6 hours as needed  Triamcinolone 0.1% cream - applied 2 times daily  Ferrous Sulfate 325 (65 FE)mg - 1 tablet daily  Vitamin D 2000 units -1 tablet daily  Miralax - 17g daily as needed  -Educated on Cost vs benefit of each product must be carefully weighed by individual consumer -Patient is satisfied with current therapy and denies issues -Recommended to continue current medication  Patient Goals/Self-Care Activities Patient will:  - take medications as prescribed check blood pressure once weekly if  possible, document, and provide at future appointments target a minimum of 150 minutes of moderate intensity exercise weekly engage in dietary modifications by reducing carbohydrate intake   Follow Up Plan: Telephone follow up appointment with care management team member scheduled for: 3 months The patient has been provided with contact information for the care management team and has been advised to call with any health related questions or concerns.         Medication Assistance: None required.  Patient affirms current coverage meets  needs.  Patient's preferred pharmacy is:  [facility] [phone] - [place], [place] - [address] [address] Phone: [phone] Fax: [phone]  [facility] [phone] - [place], [place] [address] [place] [address] Phone: [phone] Fax: [phone]  [place], [place] [address] Phone: [phone] Fax: [phone]   Uses pill box? Yes - managed by her daughter  Pt endorses 100% compliance  Care Plan and Follow Up Patient Decision:  Patient agrees to Care Plan and Follow-up.  Plan: Telephone follow up appointment with care management team member scheduled for:  3 months and The patient has been provided with contact information for the care management team and has been advised to call with any health related questions or concerns.   Tomasa Blase, PharmD Clinical Pharmacist, [place]

## 2021-07-21 NOTE — Patient Instructions (Signed)
[name],  It was great to talk to you today!  Please call me with any questions or concerns.  Visit Information   PATIENT GOALS:   Goals Addressed             This Visit's Progress    Manage My Medicine       Timeframe:  Long-Range Goal Priority:  High Start Date: 07/21/2021                            Expected End Date: 01/19/2022                      Follow Up Date 10/21/2021   - keep a list of all the medicines I take; vitamins and herbals too - learn to read medicine labels - use a pillbox to sort medicine    Why is this important?   These steps will help you keep on track with your medicines.        Consent to CCM Services: [name] was given information about Chronic Care Management services including:  CCM service includes personalized support from designated clinical staff supervised by her physician, including individualized plan of care and coordination with other care providers 24/7 contact phone numbers for assistance for urgent and routine care needs. Service will only be billed when office clinical staff spend 20 minutes or more in a month to coordinate care. Only one practitioner may furnish and bill the service in a calendar month. The patient may stop CCM services at any time (effective at the end of the month) by phone call to the office staff. The patient will be responsible for cost sharing (co-pay) of up to 20% of the service fee (after annual deductible is met).  Patient agreed to services and verbal consent obtained.   Patient verbalizes understanding of instructions provided today and agrees to view in [place].   Telephone follow up appointment with care management team member scheduled for: 3 months The patient has been provided with contact information for the care management team and has been advised to call with any health related questions or concerns.   Tomasa Blase, PharmD Clinical Pharmacist, [place]    CLINICAL CARE PLAN: Patient Care Plan: CCM Care Plan     Problem Identified: Hypertension, Hyperlipidemia, Prediabetes, Chronic Kidney Disease,GERD, and Chronic pain   Priority: High  Onset Date: 07/21/2021     Long-Range Goal: Disease Management   Start Date: 07/21/2021  Expected End Date: 01/19/2022  This Visit's Progress: On track  Priority: High  Note:   Current Barriers:  Unable to independently monitor therapeutic efficacy  Pharmacist Clinical Goal(s):  Patient will achieve adherence to monitoring guidelines and medication adherence to achieve therapeutic efficacy through collaboration with PharmD and provider.   Interventions: 1:1 collaboration with Biagio Borg, MD regarding development and update of comprehensive plan of care as evidenced by provider attestation and co-signature Inter-disciplinary care team collaboration (see longitudinal plan of care) Comprehensive medication review performed; medication list updated in electronic medical record  Hypertension (BP goal <130/80) -Controlled -Current treatment: Losartan 172m - 1 tablet daily  Metoprolol Tartrate 51m- 1 tablet twice daily  Furosemide 8013m 1 tablet twice daily -  Potassium Chloride 63m91m 1 tablet daily  Lab Results  Component Value Date   K 3.9 07/14/2021  Diltiazem XR 240mg67m capsule daily  -Medications previously tried: hctz  -Current  home readings: n/a - does not have BP cuff at home  BP Readings from Last 3 Encounters:  07/14/21 132/82  05/29/21 126/82  05/15/21 (!) 144/78  -Current dietary habits: reports that she does not add salt to her food - notes that most of her meals are pre-made from meals on wheels  -Current exercise habits: none at this time  -Denies hypotensive/hypertensive symptoms -Educated on BP goals and benefits of medications for prevention of heart attack, stroke and kidney damage; Daily salt intake goal < 2300 mg; Exercise goal of 150 minutes per week; Importance  of home blood pressure monitoring; Proper BP monitoring technique; Symptoms of hypotension and importance of maintaining adequate hydration; -Counseled to monitor BP at home weekly if able, document, and provide log at future appointments -Counseled on diet and exercise extensively Recommended to continue current medication  Hyperlipidemia: (LDL goal < 100) -Not ideally controlled Lab Results  Component Value Date   LDLCALC 107 (H) 07/14/2021  -Current treatment: Atorvastatin 10m - 1 tablet daily (started with last PCP appointment - LDL has not been checked since increase) -Medications previously tried: atorvastatin 29mdaily   -Current dietary patterns: receives some of her meals from meals on wheel  -Current exercise habits: nothing at this time - [facility]  -Educated on Cholesterol goals;  Benefits of statin for ASCVD risk reduction; Importance of limiting foods high in cholesterol; Exercise goal of 150 minutes per week; -Counseled on diet and exercise extensively  Prediabetes (A1c goal <6.5%) -Not ideally controlled Lab Results  Component Value Date   HGBA1C 6.6 (H) 07/14/2021  -Current medications: N/a - diet controlled at this time  -Medications previously tried: n/a  -Current home glucose readings fasting glucose: n/a - not currently checking  -Denies hypoglycemic/hyperglycemic symptoms -Current meal patterns:  breakfast: fruit, grits, cereal,   lunch: supplied by meals on wheels - sandwich, beans, vegetable  dinner: meals on wheels - spaghetti, chicken, broccoli, beans, salad snacks: "loves sweets" drinks: water, juice, ginger ale -Current exercise: none at this time, plans to restart going to [employer]/ using exercise bike in the near future  -Educated on A1c and blood sugar goals; Complications of diabetes including kidney damage, retinal damage, and cardiovascular disease; Exercise goal of 150 minutes per  week; Benefits of weight loss; Carbohydrate counting and/or plate method -Counseled to check feet daily and get yearly eye exams -Counseled on diet and exercise extensively  Chronic Pain/ Spinal Stenosis / OA (Goal: Pain control) -Follows with Dr. SaSimeon CraftControlled -Current treatment  Hydrocodone/APAP 5/32556m 1 tablet every 8 hours as needed  Tizanidine 2mg53m1 tablet every 6 hours as needed  Duloxetine 20mg59m capsules daily  Gabapentin 100mg 30mcapsules at bedtime  -Medications previously tried: tramadol, diclofenac, meloxicam, naproxen, percocet,   -Recommended to continue current medication  GERD (Goal: Prevention/ control of acid reflux) -Controlled -Current treatment  Pantoprazole 40mg -83mablet daily  -Medications previously tried: esomeprazole, omeprazole, sucralfate, ranitidine  -Counseled on diet and exercise extensively Recommended to continue current medication  Allergic Rhinitis (Goal: Prevention/ control of allergies) -Controlled -Current treatment  Cetirizine 10mg - 47mblet daily as neded  Azelastine 0.1% nasal spray - 1 spray into each nostril 2 times daily as needed  -Medications previously tried: flonase, levocetirizine   -Recommended to continue current medication  Chronic Kidney Disease (Goal: Prevention of disease progression) -Controlled/ stable -Last eGFR - 39.08mL/min28mst Scr - 1.34 mg/dL -  Last CrCl - 42.58m/min  -Current treatment  Avoidance of nephrotoxic agents / adequate BP and BG control to prevent kidney damage  -Medications previously tried: n/a  -Recommended to continue current medication   Health Maintenance -Vaccine gaps: up to date  -Current therapy:  Systane Eye drops  - 1 drop into each eye 3 times daily as needed  Vitamin B12 - 10053m daily  Excedrin 250*250-65101m- 1 tablet every 6 hours as needed  Triamcinolone 0.1% cream - applied 2 times daily  Ferrous Sulfate 325 (65 FE)mg - 1 tablet daily  Vitamin D 2000  units -1 tablet daily  Miralax - 17g daily as needed  -Educated on Cost vs benefit of each product must be carefully weighed by individual consumer -Patient is satisfied with current therapy and denies issues -Recommended to continue current medication  Patient Goals/Self-Care Activities Patient will:  - take medications as prescribed check blood pressure once weekly if possible, document, and provide at future appointments target a minimum of 150 minutes of moderate intensity exercise weekly engage in dietary modifications by reducing carbohydrate intake   Follow Up Plan: Telephone follow up appointment with care management team member scheduled for: 3 months The patient has been provided with contact information for the care management team and has been advised to call with any health related questions or concerns.

## 2021-08-14 ENCOUNTER — Encounter: Payer: Self-pay | Admitting: Vascular Surgery

## 2021-08-14 ENCOUNTER — Ambulatory Visit (HOSPITAL_COMMUNITY)
Admission: RE | Admit: 2021-08-14 | Discharge: 2021-08-14 | Disposition: A | Discharge status: Home / Self Care | Payer: Medicare HMO | Source: Ambulatory Visit | Attending: Vascular Surgery | Admitting: Vascular Surgery

## 2021-08-14 ENCOUNTER — Ambulatory Visit: Payer: Medicare HMO | Admitting: Vascular Surgery

## 2021-08-14 ENCOUNTER — Other Ambulatory Visit: Payer: Self-pay

## 2021-08-14 ENCOUNTER — Other Ambulatory Visit: Payer: Self-pay | Admitting: Internal Medicine

## 2021-08-14 VITALS — BP 133/70 | HR 64 | Temp 98.4°F | Resp 20 | Ht 63.0 in | Wt 228.0 lb

## 2021-08-14 DIAGNOSIS — I872 Venous insufficiency (chronic) (peripheral): Secondary | ICD-10-CM | POA: Insufficient documentation

## 2021-08-14 DIAGNOSIS — R6889 Other general symptoms and signs: Secondary | ICD-10-CM | POA: Diagnosis not present

## 2021-08-14 MED ORDER — TIZANIDINE HCL 2 MG PO TABS: 2.0000 mg | ORAL_TABLET | Freq: Four times a day (QID) | ORAL | 3 refills | Status: DC | PRN

## 2021-08-14 NOTE — Progress Notes (Signed)
REASON FOR VISIT:   Follow-up of chronic venous insufficiency  MEDICAL ISSUES:   CHRONIC VENOUS INSUFFICIENCY: This patient has CEAP C4a venous disease.  Her leg swelling in the left leg has improved some.  Her venous duplex scan today does not show any significant change.  She does have some superficial venous reflux in the left great saphenous vein however the vein is not especially dilated.  She also has deep venous reflux involving the common femoral vein.  We have again discussed the importance of intermittent leg elevation and the proper positioning for this.  She cannot wear compression stockings she simply cannot get these on.  She does walk some with her walker and is considering water aerobics which I think is an excellent option for people with venous disease and also with other limitations that can limit their ability to exercise.  We have also discussed importance of nutrition.  She would like [place] to see her back next year which I think is perfectly reasonable.  I will get a follow-up study at that time.  She knows to call sooner if she has problems.   HPI:   [name] is a pleasant 75 y.o. female who I had seen with chronic venous insufficiency on 07/11/2020.  She had CEAP C4b venous disease.  She had significant leg swelling bilaterally.  She did have some superficial venous reflux but I did not think that this was contributing significantly to her leg swelling.  She also had deep venous reflux bilaterally.  We discussed importance of intermittent leg elevation and compression therapy.  I encouraged her to exercise and avoid prolonged sitting and standing.  We also discussed importance of maintaining a healthy weight.  She comes in for a 1 year follow-up visit.  This patient continues to have persistent swelling in the left lower extremity although this has improved since I saw her last.  Of note she is also on a diuretic.  She does not have any significant symptoms  associated with her swelling.  She is ambulatory with a walker.  She is considering getting back into water aerobics which I think would be an excellent idea.  She has no previous history of DVT.  She has had no previous venous procedures.  She has been elevating her legs but cannot get compression stockings on.  Past Medical History:  Diagnosis Date   Allergic rhinitis, cause unspecified    Allergy    SEASONAL   Anemia    Cataract    Bilateral   CHF (congestive heart failure) (HCC)    Chronic headaches    Constipation    Diverticulosis    DJD (degenerative joint disease), lumbar    GERD (gastroesophageal reflux disease)    Hemorrhoids    Hiatal hernia    HTN (hypertension)    Hyperlipidemia    Obesity    Osteoarthritis    Tubular adenoma of colon     Family History  Problem Relation Age of Onset   Heart disease Mother    Hypertension Mother    Breast cancer Sister    Diabetes Brother    Colon cancer Neg Hx    Esophageal cancer Neg Hx    Rectal cancer Neg Hx    Stomach cancer Neg Hx    Colon polyps Neg Hx     SOCIAL HISTORY: Social History   Tobacco Use   Smoking status: Never    Passive exposure: Never   Smokeless tobacco: Never  Substance  Use Topics   Alcohol use: No    Alcohol/week: 0.0 standard drinks    Allergies  Allergen Reactions   Oxycodone    Soybean-Containing Drug Products Other (See Comments)    Allergy testing positive for soy beans but pt uses soy sauce   Sulfonamide Derivatives Itching and Rash    Current Outpatient Medications  Medication Sig Dispense Refill   aspirin-acetaminophen-caffeine (EXCEDRIN MIGRAINE) 250-250-65 MG tablet Take 1 tablet by mouth every 6 (six) hours as needed for headache.     atorvastatin (LIPITOR) 40 MG tablet Take 1 tablet (40 mg total) by mouth daily. 90 tablet 3   azelastine (ASTELIN) 0.1 % nasal spray Place 1 spray into both nostrils 2 (two) times daily. Use in each nostril as directed 90 mL 4   cetirizine  (ZYRTEC) 10 MG tablet Take 1 tablet (10 mg total) by mouth daily. As needed (Patient taking differently: Take 10 mg by mouth daily as needed for allergies. As needed) 90 tablet 3   diltiazem (DILT-XR) 240 MG 24 hr capsule Take 1 capsule (240 mg total) by mouth daily. 90 capsule 3   DULoxetine (CYMBALTA) 20 MG capsule TAKE 2 CAPSULES(40 MG) BY MOUTH DAILY 180 capsule 3   ferrous sulfate 325 (65 FE) MG tablet Take 1 tablet (325 mg total) by mouth 2 (two) times daily with a meal.  3   furosemide (LASIX) 80 MG tablet Take 1 tablet (80 mg total) by mouth 2 (two) times daily. 180 tablet 3   gabapentin (NEURONTIN) 100 MG capsule TAKE 2 CAPSULES BY MOUTH AT BEDTIME 180 capsule 2   HYDROcodone-acetaminophen (NORCO/VICODIN) 5-325 MG tablet Take 0.5 tablets by mouth daily as needed.     hydrOXYzine (ATARAX/VISTARIL) 10 MG tablet Take 1 tablet (10 mg total) by mouth every 8 (eight) hours as needed for itching. 60 tablet 2   losartan (COZAAR) 100 MG tablet TAKE 1 TABLET EVERY DAY 90 tablet 2   metoprolol tartrate (LOPRESSOR) 50 MG tablet Take 1 tablet (50 mg total) by mouth 2 (two) times daily. 180 tablet 3   pantoprazole (PROTONIX) 40 MG tablet TAKE 1 TABLET(40 MG) BY MOUTH DAILY 90 tablet 0   Polyethyl Glycol-Propyl Glycol (SYSTANE OP) Place 1 drop into both eyes 3 (three) times daily as needed (dry eyes).     Polyethylene Glycol 3350 (MIRALAX PO) Take 17 g by mouth daily as needed (constipation).      potassium chloride (KLOR-CON) 10 MEQ tablet TAKE 1 TABLET(10 MEQ) BY MOUTH DAILY 90 tablet 3   tiZANidine (ZANAFLEX) 2 MG tablet Take 1 tablet (2 mg total) by mouth every 6 (six) hours as needed for muscle spasms. 40 tablet 3   triamcinolone cream (KENALOG) 0.1 % Apply 1 application topically 2 (two) times daily. 453 g 0   vitamin B-12 (CYANOCOBALAMIN) 1000 MCG tablet Take 1,000 mcg by mouth daily.     Cholecalciferol 50 MCG (2000 UT) TABS 1 tab by mouth once daily (Patient not taking: No sig reported) 30 tablet  99   No current facility-administered medications for this visit.    REVIEW OF SYSTEMS:  [X]  denotes positive finding, [ ]  denotes negative finding Cardiac  Comments:  Chest pain or chest pressure:    Shortness of breath upon exertion:    Short of breath when lying flat:    Irregular heart rhythm:        Vascular    Pain in calf, thigh, or hip brought on by ambulation:    Pain in  feet at night that wakes you up from your sleep:     Blood clot in your veins:    Leg swelling:  x       Pulmonary    Oxygen at home:    Productive cough:     Wheezing:         Neurologic    Sudden weakness in arms or legs:     Sudden numbness in arms or legs:     Sudden onset of difficulty speaking or slurred speech:    Temporary loss of vision in one eye:     Problems with dizziness:         Gastrointestinal    Blood in stool:     Vomited blood:         Genitourinary    Burning when urinating:     Blood in urine:        Psychiatric    Major depression:         Hematologic    Bleeding problems:    Problems with blood clotting too easily:        Skin    Rashes or ulcers:        Constitutional    Fever or chills:     PHYSICAL EXAM:   Vitals:   08/14/21 0957  BP: 133/70  Pulse: 64  Resp: 20  Temp: 98.4 F (36.9 C)  SpO2: 97%  Weight: 228 lb (103.4 kg)  Height: 5\' 3"  (1.6 m)    GENERAL: The patient is a well-nourished female, in no acute distress. The vital signs are documented above. CARDIAC: There is a regular rate and rhythm.  VASCULAR: I do not detect carotid bruits. She has palpable dorsalis pedis pulses. She has bilateral lower extremity swelling which is more significant on the left side.  She does have some hyperpigmentation bilaterally. PULMONARY: There is good air exchange bilaterally without wheezing or rales. ABDOMEN: Soft and non-tender with normal pitched bowel sounds.  MUSCULOSKELETAL: There are no major deformities or cyanosis. NEUROLOGIC: No focal  weakness or paresthesias are detected. SKIN: There are no ulcers or rashes noted. PSYCHIATRIC: The patient has a normal affect.  DATA:    VENOUS DUPLEX: I have independently interpreted her venous duplex scan today.  This was of the left lower extremity only.  She has no evidence of DVT.  She has deep venous reflux involving the common femoral vein.  She has superficial venous reflux in the left great saphenous vein from the saphenofemoral junction of the proximal calf.  Diameters of the vein ranged from 3-to 4.4 mm.     Deitra Mayo [facility] [phone]

## 2021-08-15 ENCOUNTER — Other Ambulatory Visit: Payer: Self-pay | Admitting: Internal Medicine

## 2021-08-18 DIAGNOSIS — E782 Mixed hyperlipidemia: Secondary | ICD-10-CM

## 2021-08-18 DIAGNOSIS — I10 Essential (primary) hypertension: Secondary | ICD-10-CM | POA: Diagnosis not present

## 2021-08-18 DIAGNOSIS — N1831 Chronic kidney disease, stage 3a: Secondary | ICD-10-CM | POA: Diagnosis not present

## 2021-08-24 ENCOUNTER — Other Ambulatory Visit: Payer: Self-pay | Admitting: Gastroenterology

## 2021-08-27 DIAGNOSIS — Z0000 Encounter for general adult medical examination without abnormal findings: Secondary | ICD-10-CM | POA: Diagnosis not present

## 2021-08-27 DIAGNOSIS — Z6841 Body Mass Index (BMI) 40.0 and over, adult: Secondary | ICD-10-CM | POA: Diagnosis not present

## 2021-08-29 ENCOUNTER — Other Ambulatory Visit: Payer: Self-pay | Admitting: Internal Medicine

## 2021-08-29 NOTE — Telephone Encounter (Signed)
Please refill as per office routine med refill policy (all routine meds to be refilled for 3 mo or monthly (per pt preference) up to one year from last visit, then month to month grace period for 3 mo, then further med refills will have to be denied) ? ?

## 2021-09-17 DIAGNOSIS — Z6841 Body Mass Index (BMI) 40.0 and over, adult: Secondary | ICD-10-CM | POA: Diagnosis not present

## 2021-09-17 DIAGNOSIS — F1120 Opioid dependence, uncomplicated: Secondary | ICD-10-CM | POA: Diagnosis not present

## 2021-09-17 DIAGNOSIS — M48062 Spinal stenosis, lumbar region with neurogenic claudication: Secondary | ICD-10-CM | POA: Diagnosis not present

## 2021-09-18 ENCOUNTER — Other Ambulatory Visit: Payer: Self-pay | Admitting: Internal Medicine

## 2021-09-25 ENCOUNTER — Telehealth: Payer: Self-pay | Admitting: Internal Medicine

## 2021-09-25 NOTE — Telephone Encounter (Signed)
Type of form received (Home Health, FMLA, disability, handicapped placard, Surgical clearance) Accessible Collection Service Application  Form placed in (E-fax folder, Provider mailbox) Provider mailbox   Additional instructions from the patient (mail, fax, notify by phone when complete) Notify patient by phone and fax to [name] [phone]  Things to remember: [name] office: If form received in person, remind patient that forms take 7-10 business days CMA should attach charge sheet and put on Supervisor's desk

## 2021-09-26 ENCOUNTER — Telehealth: Payer: Self-pay | Admitting: Internal Medicine

## 2021-09-26 NOTE — Telephone Encounter (Signed)
Patient requesting form for handicapped placed card filled out  Patient is requesting form mailed to home address when completed.

## 2021-09-29 NOTE — Telephone Encounter (Signed)
Patient notified that paperwork is being worked on. Patient requests papers to be mailed to her. Advised patient will mail and notify her when completed.

## 2021-09-30 ENCOUNTER — Other Ambulatory Visit: Payer: Self-pay | Admitting: Internal Medicine

## 2021-10-07 NOTE — Telephone Encounter (Signed)
Left message for patient to call me back. 

## 2021-10-07 NOTE — Telephone Encounter (Signed)
Patient calling in to confirm if paperwork has been completed by provider & mailed  Please verify & contact patient

## 2021-10-09 NOTE — Telephone Encounter (Signed)
Notified patient that paperwork is in the mail

## 2021-10-15 NOTE — Telephone Encounter (Signed)
Patient calling in  Says she still has not received paperwork in the mail  Please call [phone]

## 2021-10-15 NOTE — Telephone Encounter (Signed)
Patient advised that paperwork was placed in the mail last week. Advised patient to wait a couple more days to see if she receives it since the holiday was over the weekend.

## 2021-10-16 ENCOUNTER — Ambulatory Visit: Payer: Medicare HMO

## 2021-10-21 ENCOUNTER — Telehealth: Payer: Medicare HMO

## 2021-10-21 NOTE — Progress Notes (Deleted)
Chronic Care Management Pharmacy Note  10/21/2021 Name:  [name] MRN:  [id number] DOB:  July 30, 1946  Summary: -Patient reports that she has been doing well since most recent increase in atorvastatin, denies and AE or concerns -BP has been well controlled at most recent office visits, patient does not have a blood pressure cuff at home to be able to monitor at home  -Notes to occasional orthostatic hypotension that will subside a few moments later after standing  Recommendations/Changes made from today's visit: -Recommending no changes to medications at this time, reviewed with patient prediabetes and concern about elevated A1c at 6.6% - advised for patient to moderate carbohydrate intake, increase protein and vegetable intake   Subjective: [name] is an 76 y.o. year old female who is a primary patient of Jenny Reichmann, Hunt Oris, MD.  The CCM team was consulted for assistance with disease management and care coordination needs.    Engaged with patient by telephone for follow up visit in response to provider referral for pharmacy case management and/or care coordination services.   Consent to Services:  The patient was given the following information about Chronic Care Management services today, agreed to services, and gave verbal consent: 1. CCM service includes personalized support from designated clinical staff supervised by the primary care provider, including individualized plan of care and coordination with other care providers 2. 24/7 contact phone numbers for assistance for urgent and routine care needs. 3. Service will only be billed when office clinical staff spend 20 minutes or more in a month to coordinate care. 4. Only one practitioner may furnish and bill the service in a calendar month. 5.The patient may stop CCM services at any time (effective at the end of the month) by phone call to the office staff. 6. The patient will be responsible for cost sharing (co-pay) of up  to 20% of the service fee (after annual deductible is met). Patient agreed to services and consent obtained.  Patient Care Team: Biagio Borg, MD as PCP - General (Internal Medicine) Delice Bison Darnelle Maffucci, [facility] as Pharmacist (Pharmacist)  Recent office visits:  07/14/21 [name] MD PCP- pt seen for f/u (HTN, HLD, hyperglycemia, CKD, and low vit D - atorvastatin increased to 50m daily, start vitamin D supplementation, flu shot given   05/13/21 JCathlean CowerMD PCP- pt was seen for left leg pain and swelling. LLE venous doppler  was ordered and Doxycycline Hyclate 100 mg BID for cellulitis.   01/14/21 JCathlean CowerMD PCP- pt was seen for leg and joint swelling. Labs were ordered and pt started on Doxycycline 100 mg BID  for cellulitis. Follow up 6 months.   Recent consult visits:  08/14/2021 - Dr. DScot Dock- Vascular Surgery - no changes to medications, encourage exercise to help with leg swelling - f/u in 1 year   Hospital visits:  None in previous 6 months  Objective:  Lab Results  Component Value Date   CREATININE 1.34 (H) 07/14/2021   BUN 30 (H) 07/14/2021   GFR 39.08 (L) 07/14/2021   GFRNONAA >60 08/22/2020   GFRAA >60 12/18/2019   NA 140 07/14/2021   K 3.9 07/14/2021   CALCIUM 9.6 07/14/2021   CO2 30 07/14/2021   GLUCOSE 91 07/14/2021    Lab Results  Component Value Date/Time   HGBA1C 6.6 (H) 07/14/2021 12:07 PM   HGBA1C 7.0 (H) 01/14/2021 02:50 PM   GFR 39.08 (L) 07/14/2021 12:07 PM   GFR 51.37 (L) 01/14/2021 02:50 PM  Last diabetic Eye exam:  No results found for: HMDIABEYEEXA  Last diabetic Foot exam:  No results found for: HMDIABFOOTEX   Lab Results  Component Value Date   CHOL 198 07/14/2021   HDL 68.30 07/14/2021   LDLCALC 107 (H) 07/14/2021   LDLDIRECT 93.0 01/14/2021   TRIG 114.0 07/14/2021   CHOLHDL 3 07/14/2021    Hepatic Function Latest Ref Rng & Units 07/14/2021 01/14/2021 09/20/2020  Total Protein 6.0 - 8.3 g/dL 7.2 7.5 7.2  Albumin 3.5 - 5.2 g/dL 4.0  4.2 -  AST 0 - 37 U/L _0 ALT 0 - 35 U/L _1 Alk Phosphatase 39 - 117 U/L 119(H) 123(H) -  Total Bilirubin 0.2 - 1.2 mg/dL 0.3 0.4 0.5  Bilirubin, Direct 0.0 - 0.3 mg/dL 0.1 0.1 0.1    Lab Results  Component Value Date/Time   TSH 0.57 01/14/2021 02:50 PM   TSH 0.96 02/06/2020 09:56 AM    CBC Latest Ref Rng & Units 01/14/2021 09/20/2020 08/23/2020  WBC 4.0 - 10.5 K/uL 10.7(H) 12.3(H) 12.7(H)  Hemoglobin 12.0 - 15.0 g/dL 12.7 11.0(L) 8.1(L)  Hematocrit 36.0 - 46.0 % 38.6 34.9(L) 26.8(L)  Platelets 150.0 - 400.0 K/uL 349.0 409(H) 475(H)    Lab Results  Component Value Date/Time   VD25OH 33.18 07/14/2021 12:07 PM   VD25OH 33.98 01/14/2021 02:50 PM    Clinical ASCVD: No  The 10-year ASCVD risk score (Arnett DK, et al., 2019) is: 14.1%   Values used to calculate the score:     Age: 76 years     Sex: Female     Is Non-Hispanic African American: Yes     Diabetic: No     Tobacco smoker: No     Systolic Blood Pressure: 360 mmHg     Is BP treated: Yes     HDL Cholesterol: 68.3 mg/dL     Total Cholesterol: 198 mg/dL    Depression screen [facility] 2/9 05/13/2021 09/09/2020 08/07/2020  Decreased Interest 0 0 0  Down, Depressed, Hopeless 0 1 1  PHQ - 2 Score 0 1 1  Altered sleeping - - -  Tired, decreased energy - - -  Change in appetite - - -  Feeling bad or failure about yourself  - - -  Trouble concentrating - - -  Moving slowly or fidgety/restless - - -  Suicidal thoughts - - -  PHQ-9 Score - - -  Difficult doing work/chores - - -  Some recent data might be hidden    Social History   Tobacco Use  Smoking Status Never   Passive exposure: Never  Smokeless Tobacco Never   BP Readings from Last 3 Encounters:  08/14/21 133/70  07/14/21 132/82  05/29/21 126/82   Pulse Readings from Last 3 Encounters:  08/14/21 64  07/14/21 68  05/29/21 60   Wt Readings from Last 3 Encounters:  08/14/21 228 lb (103.4 kg)  07/14/21 227 lb 6.4 oz (103.1 kg)  05/29/21 227 lb  9.6 oz (103.2 kg)   BMI Readings from Last 3 Encounters:  08/14/21 40.39 kg/m  07/14/21 40.28 kg/m  05/29/21 40.32 kg/m    Assessment/Interventions: Review of patient past medical history, allergies, medications, health status, including review of consultants reports, laboratory and other test data, was performed as part of comprehensive evaluation and provision of chronic care management services.   SDOH:  (Social Determinants of Health) assessments and interventions performed: Yes  SDOH Screenings   Alcohol Screen: Not on file  Depression (PHQ2-9): Low Risk    PHQ-2 Score: 0  Financial Resource Strain: Not on file  Food Insecurity: Not on file  Housing: Not on file  Physical Activity: Not on file  Social Connections: Not on file  Stress: Not on file  Tobacco Use: Low Risk    Smoking Tobacco Use: Never   Smokeless Tobacco Use: Never   Passive Exposure: Never  Transportation Needs: Not on file    [place]  Allergies  Allergen Reactions   Oxycodone    Soybean-Containing Drug Products Other (See Comments)    Allergy testing positive for soy beans but pt uses soy sauce   Sulfonamide Derivatives Itching and Rash    Medications Reviewed Today     Reviewed by Angelia Mould, MD (Physician) on 08/14/21 at 1014  Med List Status: <None>   Medication Order Taking? Sig Documenting Provider Last Dose Status Informant  aspirin-acetaminophen-caffeine (EXCEDRIN MIGRAINE) 202-542-70 MG tablet [id number] Yes Take 1 tablet by mouth every 6 (six) hours as needed for headache. [provider] Taking Active   atorvastatin (LIPITOR) 40 MG tablet [id number] Yes Take 1 tablet (40 mg total) by mouth daily. Biagio Borg, MD Taking Active   azelastine (ASTELIN) 0.1 % nasal spray [id number] Yes Place 1 spray into both nostrils 2 (two) times daily. Use in each nostril as directed Baird Lyons D, MD Taking Active   cetirizine (ZYRTEC) 10 MG tablet [id number] Yes Take 1 tablet  (10 mg total) by mouth daily. As needed  Patient taking differently: Take 10 mg by mouth daily as needed for allergies. As needed   Biagio Borg, MD Taking Active Self  Cholecalciferol 50 MCG (2000 UT) TABS [id number] No 1 tab by mouth once daily  Patient not taking: No sig reported   Biagio Borg, MD Not Taking Active   diltiazem (DILT-XR) 240 MG 24 hr capsule [id number] Yes Take 1 capsule (240 mg total) by mouth daily. Biagio Borg, MD Taking Active   DULoxetine (CYMBALTA) 20 MG capsule [id number] Yes TAKE 2 CAPSULES(40 MG) BY MOUTH DAILY Biagio Borg, MD Taking Active   ferrous sulfate 325 (65 FE) MG tablet [id number] Yes Take 1 tablet (325 mg total) by mouth 2 (two) times daily with a meal. Kc, Ramesh, MD Taking Active   furosemide (LASIX) 80 MG tablet [id number] Yes Take 1 tablet (80 mg total) by mouth 2 (two) times daily. Biagio Borg, MD Taking Active   gabapentin (NEURONTIN) 100 MG capsule [id number] Yes TAKE 2 CAPSULES BY MOUTH AT BEDTIME Biagio Borg, MD Taking Active   HYDROcodone-acetaminophen (NORCO/VICODIN) 5-325 MG tablet [id number] Yes Take 0.5 tablets by mouth daily as needed. [provider] Taking Active   hydrOXYzine (ATARAX/VISTARIL) 10 MG tablet [id number] Yes Take 1 tablet (10 mg total) by mouth every 8 (eight) hours as needed for itching. Biagio Borg, MD Taking Active   losartan (COZAAR) 100 MG tablet [id number] Yes TAKE 1 TABLET EVERY DAY Biagio Borg, MD Taking Active   metoprolol tartrate (LOPRESSOR) 50 MG tablet [id number] Yes Take 1 tablet (50 mg total) by mouth 2 (two) times daily. Biagio Borg, MD Taking Active   pantoprazole (PROTONIX) 40 MG tablet [id number] Yes TAKE 1 TABLET(40 MG) BY MOUTH DAILY Danis, Kirke Corin, MD Taking Active   Polyethyl Glycol-Propyl Glycol (SYSTANE OP) [id number] Yes Place 1 drop into both eyes 3 (three) times daily as needed (dry eyes). [provider] Taking Active   Polyethylene Glycol  3350 (MIRALAX PO) [id number] Yes Take  17 g by mouth daily as needed (constipation).  [provider] Taking Active Self  potassium chloride (KLOR-CON) 10 MEQ tablet [id number] Yes TAKE 1 TABLET(10 MEQ) BY MOUTH DAILY Biagio Borg, MD Taking Active   tiZANidine (ZANAFLEX) 2 MG tablet [id number] Yes Take 1 tablet (2 mg total) by mouth every 6 (six) hours as needed for muscle spasms. Biagio Borg, MD Taking Active   triamcinolone cream (KENALOG) 0.1 % [id number] Yes Apply 1 application topically 2 (two) times daily. Marrian Salvage, FNP Taking Active Self  vitamin B-12 (CYANOCOBALAMIN) 1000 MCG tablet [id number] Yes Take 1,000 mcg by mouth daily. [provider] Taking Active             Patient Active Problem List   Diagnosis Date Noted   CKD (chronic kidney disease) stage 3, GFR 30-59 ml/min (HCC) 07/14/2021   Vitamin D deficiency 07/14/2021   Pain and swelling of lower leg, left 05/15/2021   Cellulitis of lower leg 01/14/2021   Sepsis due to cellulitis ([place]) 08/13/2020   Cellulitis of right lower extremity 08/13/2020   Injury of back due to fall 08/13/2020   Microcytic anemia 08/13/2020   Lactic acidosis 08/13/2020   Sepsis ([place]) 08/13/2020   Aortic atherosclerosis ([place]) 08/07/2020   Itching 08/07/2020   Leg skin lesion, left 08/07/2020   Cellulitis of left leg 01/11/2020   Pulmonary embolism ([facility]) 12/28/2019   Leg wound, left 11/12/2019   Insomnia 09/23/2019   Peripheral edema 07/20/2019   Primary osteoarthritis of right knee 04/14/2019   Hyperglycemia 04/13/2018   Acute bursitis of right shoulder 08/16/2017   Abnormal CXR 07/26/2017   Unexplained night sweats 07/26/2017   Greater trochanteric bursitis of right hip 06/23/2017   Sweating abnormality 03/09/2017   Gingivitis 03/09/2017   Herpes zoster without complication 48/88/9169   Trapezoid ligament sprain, right, initial encounter 12/11/2016   Fasciculation 12/11/2016   Acute bronchitis 11/16/2016   Degenerative joint disease of left  shoulder 10/07/2016   Degenerative arthritis of knee, bilateral 09/16/2016   Lumbar stenosis with neurogenic claudication 06/17/2016   Compression fracture of L1 lumbar vertebra ([place]) 03/31/2016   Pelvic mass in female 03/31/2016   Urinary frequency 02/27/2016   Chronic maxillary sinusitis 06/06/2015   Carpal tunnel syndrome 09/18/2014   Sprain of ankle, unspecified site 07/04/2014   Primary localized osteoarthrosis, lower leg 05/21/2014   Left knee pain 03/15/2014   Food allergy 10/05/2013   Eustachian tube dysfunction, left 04/24/2013   Degenerative arthritis of left knee 04/24/2013   Hot flashes 04/24/2013   External otitis of left ear 12/15/2012   Spinal stenosis of lumbar region 04/25/2012   Seasonal and perennial allergic rhinitis 03/18/2012   Hearing loss in right ear 03/18/2012   Esophageal dysphagia 02/11/2012   Dysphagia 01/20/2012   Rash 01/20/2012   Bilateral knee pain 01/20/2012   Encounter for well adult exam with abnormal findings 01/14/2012   GERD (gastroesophageal reflux disease) 01/14/2012   Essential (primary) hypertension 01/14/2012   Mixed hyperlipidemia 01/14/2012   Body mass index (BMI) 39.0-39.9, adult 01/14/2012   Osteoarthritis 01/14/2012   DJD (degenerative joint disease), lumbar 01/14/2012   Palpitations 11/21/2010   Dyspnea on exertion 11/21/2010    Immunization History  Administered Date(s) Administered   Fluad Quad(high Dose 65+) 07/12/2020, 07/14/2021   Influenza Split 09/30/2012   Influenza, High Dose Seasonal PF 08/01/2013, 08/11/2016, 07/26/2017, 06/24/2018, 07/11/2019   Influenza,inj,Quad PF,6+ Mos 07/26/2015   Moderna Sars-Covid-2 Vaccination 11/25/2019,  12/18/2019, 08/05/2020   Pneumococcal Conjugate-13 03/15/2014   Pneumococcal Polysaccharide-23 01/30/2013   Tdap 01/18/1999, 04/13/2018    Conditions to be addressed/monitored:  Hypertension, Hyperlipidemia, Prediabetes, Chronic Kidney Disease,GERD, and Chronic pain  There are no  care plans that you recently modified to display for this patient.       Medication Assistance: None required.  Patient affirms current coverage meets needs.  Patient's preferred pharmacy is:  [facility] [phone] - [place], [place] - [address] [place] [address] Phone: [phone] Fax: [phone]  [facility] [phone] - [place], [place] - [address] [place] [address] Phone: [phone] Fax: [phone]  [place], [place] [address] Phone: [phone] Fax: [phone]    Uses pill box? Yes - managed by her daughter  Pt endorses 100% compliance  Care Plan and Follow Up Patient Decision:  Patient agrees to Care Plan and Follow-up.  Plan: Telephone follow up appointment with care management team member scheduled for:  3 months and The patient has been provided with contact information for the care management team and has been advised to call with any health related questions or concerns.   Tomasa Blase, PharmD Clinical Pharmacist, [place]

## 2021-10-22 ENCOUNTER — Ambulatory Visit (INDEPENDENT_AMBULATORY_CARE_PROVIDER_SITE_OTHER): Payer: Medicare HMO

## 2021-10-22 DIAGNOSIS — Z0000 Encounter for general adult medical examination without abnormal findings: Secondary | ICD-10-CM | POA: Diagnosis not present

## 2021-10-22 NOTE — Progress Notes (Signed)
I connected with [name] today by telephone and verified that I am speaking with the correct person using two identifiers. Location patient: home Location provider: work Persons participating in the virtual visit: patient, provider.   I discussed the limitations, risks, security and privacy concerns of performing an evaluation and management service by telephone and the availability of in person appointments. I also discussed with the patient that there may be a patient responsible charge related to this service. The patient expressed understanding and verbally consented to this telephonic visit.    Interactive audio and video telecommunications were attempted between this provider and patient, however failed, due to patient having technical difficulties OR patient did not have access to video capability.  We continued and completed visit with audio only.  Some vital signs may be absent or patient reported.   Time Spent with patient on telephone encounter: 35 minutes  Subjective:   [name] is a 76 y.o. female who presents for Medicare Annual (Subsequent) preventive examination.  Review of Systems     Cardiac Risk Factors include: advanced age (>6men, >58 women);dyslipidemia;family history of premature cardiovascular disease;hypertension;obesity (BMI >30kg/m2) (last BMI 40.40 done 08/14/2021)     Objective:    Today's Vitals   10/22/21 0852  PainSc: 10-Worst pain ever   There is no height or weight on file to calculate BMI.  Advanced Directives 10/22/2021 09/09/2020 03/26/2020 04/14/2019 04/12/2019 05/24/2018 02/16/2017  Does Patient Have a Medical Advance Directive? No No No No No No No  Would patient like information on creating a medical advance directive? No - Patient declined No - Patient declined No - Patient declined No - Patient declined No - Patient declined Yes (ED - Information included in AVS) Yes (ED - Information included in AVS)    Current Medications  (verified) Outpatient Encounter Medications as of 10/22/2021  Medication Sig   aspirin-acetaminophen-caffeine (EXCEDRIN MIGRAINE) 250-250-65 MG tablet Take 1 tablet by mouth every 6 (six) hours as needed for headache.   atorvastatin (LIPITOR) 40 MG tablet Take 1 tablet (40 mg total) by mouth daily.   azelastine (ASTELIN) 0.1 % nasal spray Place 1 spray into both nostrils 2 (two) times daily. Use in each nostril as directed   cetirizine (ZYRTEC) 10 MG tablet Take 1 tablet (10 mg total) by mouth daily. As needed (Patient taking differently: Take 10 mg by mouth daily as needed for allergies. As needed)   Cholecalciferol 50 MCG (2000 UT) TABS 1 tab by mouth once daily (Patient not taking: No sig reported)   diltiazem (DILACOR XR) 240 MG 24 hr capsule TAKE 1 CAPSULE EVERY DAY   DULoxetine (CYMBALTA) 20 MG capsule TAKE 2 CAPSULES(40 MG) BY MOUTH DAILY   ferrous sulfate 325 (65 FE) MG tablet Take 1 tablet (325 mg total) by mouth 2 (two) times daily with a meal.   furosemide (LASIX) 80 MG tablet TAKE 1 TABLET TWICE DAILY   gabapentin (NEURONTIN) 100 MG capsule TAKE 2 CAPSULES BY MOUTH AT BEDTIME   HYDROcodone-acetaminophen (NORCO/VICODIN) 5-325 MG tablet Take 0.5 tablets by mouth daily as needed.   hydrOXYzine (ATARAX) 10 MG tablet TAKE 1 TABLET(10 MG) BY MOUTH EVERY 8 HOURS AS NEEDED FOR ITCHING   losartan (COZAAR) 100 MG tablet TAKE 1 TABLET EVERY DAY   metoprolol tartrate (LOPRESSOR) 50 MG tablet Take 1 tablet (50 mg total) by mouth 2 (two) times daily.   pantoprazole (PROTONIX) 40 MG tablet TAKE 1 TABLET(40 MG) BY MOUTH DAILY   Polyethyl Glycol-Propyl Glycol (SYSTANE  OP) Place 1 drop into both eyes 3 (three) times daily as needed (dry eyes).   Polyethylene Glycol 3350 (MIRALAX PO) Take 17 g by mouth daily as needed (constipation).    potassium chloride (KLOR-CON M) 10 MEQ tablet TAKE 1 TABLET EVERY DAY   tiZANidine (ZANAFLEX) 2 MG tablet Take 1 tablet (2 mg total) by mouth every 6 (six) hours as  needed for muscle spasms.   triamcinolone cream (KENALOG) 0.1 % Apply 1 application topically 2 (two) times daily.   vitamin B-12 (CYANOCOBALAMIN) 1000 MCG tablet Take 1,000 mcg by mouth daily.   No facility-administered encounter medications on file as of 10/22/2021.    Allergies (verified) Oxycodone, Soybean-containing drug products, and Sulfonamide derivatives   History: Past Medical History:  Diagnosis Date   Allergic rhinitis, cause unspecified    Allergy    SEASONAL   Anemia    Cataract    Bilateral   CHF (congestive heart failure) (HCC)    Chronic headaches    Constipation    Diverticulosis    DJD (degenerative joint disease), lumbar    GERD (gastroesophageal reflux disease)    Hemorrhoids    Hiatal hernia    HTN (hypertension)    Hyperlipidemia    Obesity    Osteoarthritis    Tubular adenoma of colon    Past Surgical History:  Procedure Laterality Date   ABLATION     uterus   COLONOSCOPY  05/15/2021   [place]   TOTAL KNEE ARTHROPLASTY Right 04/14/2019   Procedure: TOTAL KNEE ARTHROPLASTY;  Surgeon: Dorna Leitz, MD;  Location: [facility];  Service: Orthopedics;  Laterality: Right;   TUBAL LIGATION     Family History  Problem Relation Age of Onset   Heart disease Mother    Hypertension Mother    Breast cancer Sister    Diabetes Brother    Colon cancer Neg Hx    Esophageal cancer Neg Hx    Rectal cancer Neg Hx    Stomach cancer Neg Hx    Colon polyps Neg Hx    Social History   Socioeconomic History   Marital status: Widowed    Spouse name: Not on file   Number of children: 3   Years of education: 34   Highest education level: Not on file  Occupational History   Occupation: retired    Fish farm manager: UNEMPLOYED  Tobacco Use   Smoking status: Never    Passive exposure: Never   Smokeless tobacco: Never  Vaping Use   Vaping Use: Never used  Substance and Sexual Activity   Alcohol use: No    Alcohol/week: 0.0 standard drinks   Drug use: No   Sexual  activity: Not Currently    Birth control/protection: Post-menopausal  Other Topics Concern   Not on file  Social History Narrative   Not on file   Social Determinants of Health   Financial Resource Strain: Low Risk    Difficulty of Paying Living Expenses: Not hard at all  Food Insecurity: No Food Insecurity   Worried About Charity fundraiser in the Last Year: Never true   [place] in the Last Year: Never true  Transportation Needs: No Transportation Needs   Lack of Transportation (Medical): No   Lack of Transportation (Non-Medical): No  Physical Activity: Inactive   Days of Exercise per Week: 0 days   Minutes of Exercise per Session: 0 min  Stress: No Stress Concern Present   Feeling of Stress : Not at all  Social Connections: Moderately Isolated   Frequency of Communication with Friends and Family: More than three times a week   Frequency of Social Gatherings with Friends and Family: Once a week   Attends Religious Services: Never   Marine scientist or Organizations: No   Attends Music therapist: 1 to 4 times per year   Marital Status: Never married    Tobacco Counseling Counseling given: Not Answered   Clinical Intake:  Pre-visit preparation completed: Yes  Pain : 0-10 Pain Score: 10-Worst pain ever Pain Type: Chronic pain Pain Location: Knee Pain Orientation: Left Pain Descriptors / Indicators: Discomfort, Constant Pain Onset: More than a month ago Pain Frequency: Constant Pain Relieving Factors: Pain medication Effect of Pain on Daily Activities: Pain can diminish job performance, lower motivation to exercise, and prevent you from completing daily tasks. Pain produces disability and affects the quality of life.  Pain Relieving Factors: Pain medication  Nutritional Risks: None Diabetes: No  How often do you need to have someone help you when you read instructions, pamphlets, or other written materials from your doctor or pharmacy?:  1 - Never What is the last grade level you completed in school?: High School Graduate  Diabetic? no  Interpreter Needed?: No  Information entered by :: Lisette Abu, LPN   Activities of Daily Living In your present state of health, do you have any difficulty performing the following activities: 10/22/2021  Hearing? N  Vision? N  Difficulty concentrating or making decisions? N  Walking or climbing stairs? Y  Dressing or bathing? N  Doing errands, shopping? N  Preparing Food and eating ? N  Using the Toilet? N  In the past six months, have you accidently leaked urine? Y  Do you have problems with loss of bowel control? N  Managing your Medications? N  Managing your Finances? N  Housekeeping or managing your Housekeeping? N  Some recent data might be hidden    Patient Care Team: Biagio Borg, MD as PCP - General (Internal Medicine) Delice Bison, Darnelle Maffucci, [facility] as Pharmacist (Pharmacist) Warden Fillers, MD as Consulting Physician (Ophthalmology)  Indicate any recent Medical Services you may have received from other than [facility] providers in the past year (date may be approximate).     Assessment:   This is a routine wellness examination for [place].  Hearing/Vision screen Hearing Screening - Comments:: Patient denied any hearing difficulty.   No hearing aids.  Vision Screening - Comments:: Patient wears corrective glasses/contacts.  Eye exam done annually by: Warden Fillers, MD.  Dietary issues and exercise activities discussed: Current Exercise Habits: The patient does not participate in regular exercise at present, Exercise limited by: orthopedic condition(s);cardiac condition(s)   Goals Addressed               This Visit's Progress     Patient Stated (pt-stated)        Patient declined health goal at this time.      Depression Screen PHQ 2/9 Scores 10/22/2021 05/13/2021 09/09/2020 08/07/2020 02/06/2020 07/20/2019 04/19/2019  PHQ - 2 Score 0 0 1 1 0 1 0  PHQ- 9  Score - - - - - - -    Fall Risk Fall Risk  10/22/2021 05/13/2021 09/09/2020 08/07/2020 02/06/2020  Falls in the past year? 0 0 1 0 0  Number falls in past yr: 0 0 0 0 -  Injury with Fall? 0 0 1 0 -  Comment - - - - -  Risk Factor  Category  - - - - -  Risk for fall due to : No Fall Risks - Impaired balance/gait - -  Follow up - - Falls evaluation completed - -    FALL RISK PREVENTION PERTAINING TO THE HOME:  Any stairs in or around the home? No  If so, are there any without handrails? No  Home free of loose throw rugs in walkways, pet beds, electrical cords, etc? Yes  Adequate lighting in your home to reduce risk of falls? Yes   ASSISTIVE DEVICES UTILIZED TO PREVENT FALLS:  Life alert? Yes  Use of a cane, walker or w/c? Yes  Grab bars in the bathroom? Yes  Shower chair or bench in shower? Yes  Elevated toilet seat or a handicapped toilet? No   TIMED UP AND GO:  Was the test performed? No .  Length of time to ambulate 10 feet: n/a sec.   Gait slow and steady with assistive device  Cognitive Function: Normal cognitive status assessed by direct observation by this Nurse Health Advisor. No abnormalities found.   MMSE - Mini Mental State Exam 05/24/2018 02/16/2017  Orientation to time 5 5  Orientation to Place 5 5  Registration 3 3  Attention/ Calculation 3 3  Recall 2 0  Language- name 2 objects 2 2  Language- repeat 1 1  Language- follow 3 step command 3 3  Language- read & follow direction 1 1  Write a sentence 1 1  Copy design 1 1  Total score 27 25        Immunizations Immunization History  Administered Date(s) Administered   Fluad Quad(high Dose 65+) 07/12/2020, 07/14/2021   Influenza Split 09/30/2012   Influenza, High Dose Seasonal PF 08/01/2013, 08/11/2016, 07/26/2017, 06/24/2018, 07/11/2019   Influenza,inj,Quad PF,6+ Mos 07/26/2015   Moderna Sars-Covid-2 Vaccination 11/25/2019, 12/18/2019, 08/05/2020   Pneumococcal Conjugate-13 03/15/2014   Pneumococcal  Polysaccharide-23 01/30/2013   Tdap 01/18/1999, 04/13/2018    TDAP status: Up to date  Flu Vaccine status: Up to date  Pneumococcal vaccine status: Up to date  Covid-19 vaccine status: Completed vaccines  Qualifies for Shingles Vaccine? Yes   Zostavax completed No   Shingrix Completed?: No.    Education has been provided regarding the importance of this vaccine. Patient has been advised to call insurance company to determine out of pocket expense if they have not yet received this vaccine. Advised may also receive vaccine at local pharmacy or Health Dept. Verbalized acceptance and understanding.  Screening Tests Health Maintenance  Topic Date Due   Zoster Vaccines- Shingrix (1 of 2) Never done   COVID-19 Vaccine (4 - Booster) 09/30/2020   MAMMOGRAM  03/25/2023   COLONOSCOPY (Pts 45-28yrs Insurance coverage will need to be confirmed)  05/15/2026   TETANUS/TDAP  04/13/2028   Pneumonia Vaccine 7+ Years old  Completed   INFLUENZA VACCINE  Completed   DEXA SCAN  Completed   Hepatitis C Screening  Completed   HPV VACCINES  Aged Out    Health Maintenance  Health Maintenance Due  Topic Date Due   Zoster Vaccines- Shingrix (1 of 2) Never done   COVID-19 Vaccine (4 - Booster) 09/30/2020    Colorectal cancer screening: Type of screening: Colonoscopy. Completed 05/15/2021. Repeat every 5 years  Mammogram status: Completed 03/24/2021. Repeat every year  Bone Density status: Completed 01/23/2016. Results reflect: Bone density results: NORMAL. Repeat every 5 years.  Lung Cancer Screening: (Low Dose CT Chest recommended if Age 15-80 years, 30 pack-year currently smoking OR have  quit w/in 15years.) does not qualify.   Lung Cancer Screening Referral: no  Additional Screening:  Hepatitis C Screening: does qualify; Completed: yes  Vision Screening: Recommended annual ophthalmology exams for early detection of glaucoma and other disorders of the eye. Is the patient up to date with their  annual eye exam?  Yes  Who is the provider or what is the name of the office in which the patient attends annual eye exams? Warden Fillers, MD. If pt is not established with a provider, would they like to be referred to a provider to establish care? No .   Dental Screening: Recommended annual dental exams for proper oral hygiene  Community Resource Referral / Chronic Care Management: CRR required this visit?  No   CCM required this visit?  No      Plan:     I have personally reviewed and noted the following in the patients chart:   Medical and social history Use of alcohol, tobacco or illicit drugs  Current medications and supplements including opioid prescriptions.  Functional ability and status Nutritional status Physical activity Advanced directives List of other physicians Hospitalizations, surgeries, and ER visits in previous 12 months Vitals Screenings to include cognitive, depression, and falls Referrals and appointments  In addition, I have reviewed and discussed with patient certain preventive protocols, quality metrics, and best practice recommendations. A written personalized care plan for preventive services as well as general preventive health recommendations were provided to patient.     Sheral Flow, LPN   5/0/0370   Nurse Notes:  Patient is cogitatively intact. There were no vitals filed for this visit. There is no height or weight on file to calculate BMI. Medications reviewed with patient; yes opioid use noted. Hearing Screening - Comments:: Patient denied any hearing difficulty.   No hearing aids.  Vision Screening - Comments:: Patient wears corrective glasses/contacts.  Eye exam done annually by: Warden Fillers, MD.

## 2021-10-23 ENCOUNTER — Ambulatory Visit (INDEPENDENT_AMBULATORY_CARE_PROVIDER_SITE_OTHER): Payer: Medicare HMO

## 2021-10-23 DIAGNOSIS — I10 Essential (primary) hypertension: Secondary | ICD-10-CM

## 2021-10-23 DIAGNOSIS — E782 Mixed hyperlipidemia: Secondary | ICD-10-CM

## 2021-10-23 NOTE — Progress Notes (Signed)
Chronic Care Management Pharmacy Note  10/23/2021 Name:  Tobey Lippard MRN:  [id number] DOB:  May 03, 1946  Summary: -Patient reports no issues since last appointment, no recent changes to medications -Confirms adherence to medications, has not been able to check blood pressures at home as she does not have a cuff, denies symptoms of hypo/hypertension -Reports that she has been watching her diet, increased her protein and vegetable intake  - has lost a few pounds since starting to watch diet   Recommendations/Changes made from today's visit: -Recommending no changes at this time, patient is scheduled for PCP visit 01/14/2021 - will have updated LDL since increasing atorvastatin - recommend adjusting dose based on results if needed    Subjective: Kenyana Husak is an 76 y.o. year old female who is a primary patient of Jenny Reichmann, Hunt Oris, MD.  The CCM team was consulted for assistance with disease management and care coordination needs.    Engaged with patient by telephone for follow up visit in response to provider referral for pharmacy case management and/or care coordination services.   Consent to Services:  The patient was given the following information about Chronic Care Management services today, agreed to services, and gave verbal consent: 1. CCM service includes personalized support from designated clinical staff supervised by the primary care provider, including individualized plan of care and coordination with other care providers 2. 24/7 contact phone numbers for assistance for urgent and routine care needs. 3. Service will only be billed when office clinical staff spend 20 minutes or more in a month to coordinate care. 4. Only one practitioner may furnish and bill the service in a calendar month. 5.The patient may stop CCM services at any time (effective at the end of the month) by phone call to the office staff. 6. The patient will be responsible for cost sharing (co-pay) of up  to 20% of the service fee (after annual deductible is met). Patient agreed to services and consent obtained.  Patient Care Team: Biagio Borg, MD as PCP - General (Internal Medicine) Delice Bison, Darnelle Maffucci, [facility] as Pharmacist (Pharmacist) Warden Fillers, MD as Consulting Physician (Ophthalmology)  Recent office visits:  None since last visit    Recent consult visits:  09/17/2021 - [facility]  08/14/2021 - Dr. Scot Dock - Vascular Surgery - no changes to medications, encourage exercise to help with leg swelling - f/u in 1 year   Hospital visits:  None in previous 6 months  Objective:  Lab Results  Component Value Date   CREATININE 1.34 (H) 07/14/2021   BUN 30 (H) 07/14/2021   GFR 39.08 (L) 07/14/2021   GFRNONAA >60 08/22/2020   GFRAA >60 12/18/2019   NA 140 07/14/2021   K 3.9 07/14/2021   CALCIUM 9.6 07/14/2021   CO2 30 07/14/2021   GLUCOSE 91 07/14/2021    Lab Results  Component Value Date/Time   HGBA1C 6.6 (H) 07/14/2021 12:07 PM   HGBA1C 7.0 (H) 01/14/2021 02:50 PM   GFR 39.08 (L) 07/14/2021 12:07 PM   GFR 51.37 (L) 01/14/2021 02:50 PM    Last diabetic Eye exam:  No results found for: HMDIABEYEEXA  Last diabetic Foot exam:  No results found for: HMDIABFOOTEX   Lab Results  Component Value Date   CHOL 198 07/14/2021   HDL 68.30 07/14/2021   LDLCALC 107 (H) 07/14/2021   LDLDIRECT 93.0 01/14/2021   TRIG 114.0 07/14/2021   CHOLHDL 3 07/14/2021    Hepatic Function Latest Ref Rng &  Units 07/14/2021 01/14/2021 09/20/2020  Total Protein 6.0 - 8.3 g/dL 7.2 7.5 7.2  Albumin 3.5 - 5.2 g/dL 4.0 4.2 -  AST 0 - 37 U/L 15 14 15   ALT 0 - 35 U/L 19 13 10   Alk Phosphatase 39 - 117 U/L 119(H) 123(H) -  Total Bilirubin 0.2 - 1.2 mg/dL 0.3 0.4 0.5  Bilirubin, Direct 0.0 - 0.3 mg/dL 0.1 0.1 0.1    Lab Results  Component Value Date/Time   TSH 0.57 01/14/2021 02:50 PM   TSH 0.96 02/06/2020 09:56 AM    CBC Latest Ref Rng & Units 01/14/2021  09/20/2020 08/23/2020  WBC 4.0 - 10.5 K/uL 10.7(H) 12.3(H) 12.7(H)  Hemoglobin 12.0 - 15.0 g/dL 12.7 11.0(L) 8.1(L)  Hematocrit 36.0 - 46.0 % 38.6 34.9(L) 26.8(L)  Platelets 150.0 - 400.0 K/uL 349.0 409(H) 475(H)    Lab Results  Component Value Date/Time   VD25OH 33.18 07/14/2021 12:07 PM   VD25OH 33.98 01/14/2021 02:50 PM    Clinical ASCVD: No  The 10-year ASCVD risk score (Arnett DK, et al., 2019) is: 14.1%   Values used to calculate the score:     Age: 76 years     Sex: Female     Is Non-Hispanic African American: Yes     Diabetic: No     Tobacco smoker: No     Systolic Blood Pressure: 497 mmHg     Is BP treated: Yes     HDL Cholesterol: 68.3 mg/dL     Total Cholesterol: 198 mg/dL    Depression screen [facility] 2/9 10/22/2021 05/13/2021 09/09/2020  Decreased Interest 0 0 0  Down, Depressed, Hopeless 0 0 1  PHQ - 2 Score 0 0 1  Altered sleeping - - -  Tired, decreased energy - - -  Change in appetite - - -  Feeling bad or failure about yourself  - - -  Trouble concentrating - - -  Moving slowly or fidgety/restless - - -  Suicidal thoughts - - -  PHQ-9 Score - - -  Difficult doing work/chores - - -  Some recent data might be hidden    Social History   Tobacco Use  Smoking Status Never   Passive exposure: Never  Smokeless Tobacco Never   BP Readings from Last 3 Encounters:  08/14/21 133/70  07/14/21 132/82  05/29/21 126/82   Pulse Readings from Last 3 Encounters:  08/14/21 64  07/14/21 68  05/29/21 60   Wt Readings from Last 3 Encounters:  08/14/21 228 lb (103.4 kg)  07/14/21 227 lb 6.4 oz (103.1 kg)  05/29/21 227 lb 9.6 oz (103.2 kg)   BMI Readings from Last 3 Encounters:  08/14/21 40.39 kg/m  07/14/21 40.28 kg/m  05/29/21 40.32 kg/m    Assessment/Interventions: Review of patient past medical history, allergies, medications, health status, including review of consultants reports, laboratory and other test data, was performed as part of comprehensive  evaluation and provision of chronic care management services.   SDOH:  (Social Determinants of Health) assessments and interventions performed: Yes  SDOH Screenings   Alcohol Screen: Low Risk    Last Alcohol Screening Score (AUDIT): 0  Depression (PHQ2-9): Low Risk    PHQ-2 Score: 0  Financial Resource Strain: Low Risk    Difficulty of Paying Living Expenses: Not hard at all  Food Insecurity: No Food Insecurity   Worried About Charity fundraiser in the Last Year: Never true   Ran Out of Food in the Last Year: Never true  Housing: Low Risk    Last Housing Risk Score: 0  Physical Activity: Inactive   Days of Exercise per Week: 0 days   Minutes of Exercise per Session: 0 min  Social Connections: Moderately Isolated   Frequency of Communication with Friends and Family: More than three times a week   Frequency of Social Gatherings with Friends and Family: Once a week   Attends Religious Services: Never   Marine scientist or Organizations: No   Attends Music therapist: 1 to 4 times per year   Marital Status: Never married  Stress: No Stress Concern Present   Feeling of Stress : Not at all  Tobacco Use: Low Risk    Smoking Tobacco Use: Never   Smokeless Tobacco Use: Never   Passive Exposure: Never  Transportation Needs: No Transportation Needs   Lack of Transportation (Medical): No   Lack of Transportation (Non-Medical): No    CCM Care Plan  Allergies  Allergen Reactions   Oxycodone    Soybean-Containing Drug Products Other (See Comments)    Allergy testing positive for soy beans but pt uses soy sauce   Sulfonamide Derivatives Itching and Rash    Medications Reviewed Today     Reviewed by Tomasa Blase, [facility] (Pharmacist) on 10/23/21 at [phone]  Med List Status: <None>   Medication Order Taking? Sig Documenting Provider Last Dose Status Informant  aspirin-acetaminophen-caffeine (EXCEDRIN MIGRAINE) 960-454-09 MG tablet [id number] Yes Take 1 tablet by  mouth every 6 (six) hours as needed for headache. [provider] Taking Active   atorvastatin (LIPITOR) 40 MG tablet [id number] Yes Take 1 tablet (40 mg total) by mouth daily. Biagio Borg, MD Taking Active   azelastine (ASTELIN) 0.1 % nasal spray [id number] Yes Place 1 spray into both nostrils 2 (two) times daily. Use in each nostril as directed Baird Lyons D, MD Taking Active   cetirizine (ZYRTEC) 10 MG tablet [id number] Yes Take 1 tablet (10 mg total) by mouth daily. As needed  Patient taking differently: Take 10 mg by mouth daily as needed for allergies. As needed   Biagio Borg, MD Taking Active Self  Cholecalciferol 50 MCG [phone] UT) TABS [id number] No 1 tab by mouth once daily  Patient not taking: Reported on 07/21/2021   Biagio Borg, MD Not Taking Active   diltiazem (DILACOR XR) 240 MG 24 hr capsule [id number] Yes TAKE 1 CAPSULE EVERY DAY Biagio Borg, MD Taking Active   DULoxetine (CYMBALTA) 20 MG capsule [id number] Yes TAKE 2 CAPSULES(40 MG) BY MOUTH DAILY Biagio Borg, MD Taking Active   ferrous sulfate 325 (65 FE) MG tablet [id number] Yes Take 1 tablet (325 mg total) by mouth 2 (two) times daily with a meal. Antonieta Pert, MD Taking Active   furosemide (LASIX) 80 MG tablet [id number] Yes TAKE 1 TABLET TWICE DAILY Biagio Borg, MD Taking Active   gabapentin (NEURONTIN) 100 MG capsule [id number] Yes TAKE 2 CAPSULES BY MOUTH AT BEDTIME Biagio Borg, MD Taking Active   HYDROcodone-acetaminophen (NORCO/VICODIN) 5-325 MG tablet [id number] Yes Take 0.5 tablets by mouth daily as needed. [provider] Taking Active   hydrOXYzine (ATARAX) 10 MG tablet [id number]  TAKE 1 TABLET(10 MG) BY MOUTH EVERY 8 HOURS AS NEEDED FOR Renaldo Harrison, MD  Active   losartan (COZAAR) 100 MG tablet [id number] Yes TAKE 1 TABLET EVERY DAY Biagio Borg, MD Taking Active   metoprolol tartrate (LOPRESSOR) 50 MG tablet [id number] Yes Take 1  tablet (50 mg total) by mouth 2 (two) times daily. Biagio Borg, MD Taking Active   pantoprazole (PROTONIX) 40 MG tablet [id number] Yes TAKE 1 TABLET(40 MG) BY MOUTH DAILY Pyrtle, Lajuan Lines, MD Taking Active   Polyethyl Glycol-Propyl Glycol (SYSTANE OP) [id number] Yes Place 1 drop into both eyes 3 (three) times daily as needed (dry eyes). [provider] Taking Active   Polyethylene Glycol 3350 (MIRALAX PO) [id number] Yes Take 17 g by mouth daily as needed (constipation).  [provider] Taking Active Self  potassium chloride (KLOR-CON M) 10 MEQ tablet [id number] Yes TAKE 1 TABLET EVERY DAY Biagio Borg, MD Taking Active   tiZANidine (ZANAFLEX) 2 MG tablet [id number] Yes Take 1 tablet (2 mg total) by mouth every 6 (six) hours as needed for muscle spasms. Biagio Borg, MD Taking Active   triamcinolone cream (KENALOG) 0.1 % [id number] Yes Apply 1 application topically 2 (two) times daily. Marrian Salvage, FNP Taking Active Self  vitamin B-12 (CYANOCOBALAMIN) 1000 MCG tablet [id number] Yes Take 1,000 mcg by mouth daily. [provider] Taking Active             Patient Active Problem List   Diagnosis Date Noted   CKD (chronic kidney disease) stage 3, GFR 30-59 ml/min ([place]) 07/14/2021   Vitamin D deficiency 07/14/2021   Pain and swelling of lower leg, left 05/15/2021   Cellulitis of lower leg 01/14/2021   Sepsis due to cellulitis ([place]) 08/13/2020   Cellulitis of right lower extremity 08/13/2020   Injury of back due to fall 08/13/2020   Microcytic anemia 08/13/2020   Lactic acidosis 08/13/2020   Sepsis ([place]) 08/13/2020   Aortic atherosclerosis ([place]) 08/07/2020   Itching 08/07/2020   Leg skin lesion, left 08/07/2020   Cellulitis of left leg 01/11/2020   Pulmonary embolism ([place]) 12/28/2019   Leg wound, left 11/12/2019   Insomnia 09/23/2019   Peripheral edema 07/20/2019   Primary osteoarthritis of right knee 04/14/2019   Hyperglycemia 04/13/2018   Acute bursitis of right shoulder 08/16/2017   Abnormal CXR 07/26/2017    Unexplained night sweats 07/26/2017   Greater trochanteric bursitis of right hip 06/23/2017   Sweating abnormality 03/09/2017   Gingivitis 03/09/2017   Herpes zoster without complication 82/64/1583   Trapezoid ligament sprain, right, initial encounter 12/11/2016   Fasciculation 12/11/2016   Acute bronchitis 11/16/2016   Degenerative joint disease of left shoulder 10/07/2016   Degenerative arthritis of knee, bilateral 09/16/2016   Lumbar stenosis with neurogenic claudication 06/17/2016   Compression fracture of L1 lumbar vertebra ([place]) 03/31/2016   Pelvic mass in female 03/31/2016   Urinary frequency 02/27/2016   Chronic maxillary sinusitis 06/06/2015   Carpal tunnel syndrome 09/18/2014   Sprain of ankle, unspecified site 07/04/2014   Primary localized osteoarthrosis, lower leg 05/21/2014   Left knee pain 03/15/2014   Food allergy 10/05/2013   Eustachian tube dysfunction, left 04/24/2013   Degenerative arthritis of left knee 04/24/2013   Hot flashes 04/24/2013   External otitis of left ear 12/15/2012   Spinal stenosis of lumbar region 04/25/2012   Seasonal and perennial allergic rhinitis 03/18/2012   Hearing loss in right ear 03/18/2012   Esophageal dysphagia 02/11/2012   Dysphagia 01/20/2012   Rash 01/20/2012   Bilateral knee pain 01/20/2012   Encounter for well adult exam with abnormal findings 01/14/2012   GERD (gastroesophageal reflux disease) 01/14/2012   Essential (primary) hypertension 01/14/2012   Mixed hyperlipidemia 01/14/2012   Body mass index (BMI) 39.0-39.9, adult  01/14/2012   Osteoarthritis 01/14/2012   DJD (degenerative joint disease), lumbar 01/14/2012   Palpitations 11/21/2010   Dyspnea on exertion 11/21/2010    Immunization History  Administered Date(s) Administered   Fluad Quad(high Dose 65+) 07/12/2020, 07/14/2021   Influenza Split 09/30/2012   Influenza, High Dose Seasonal PF 08/01/2013, 08/11/2016, 07/26/2017, 06/24/2018, 07/11/2019    Influenza,inj,Quad PF,6+ Mos 07/26/2015   Moderna Sars-Covid-2 Vaccination 11/25/2019, 12/18/2019, 08/05/2020   Pneumococcal Conjugate-13 03/15/2014   Pneumococcal Polysaccharide-23 01/30/2013   Tdap 01/18/1999, 04/13/2018    Conditions to be addressed/monitored:  Hypertension, Hyperlipidemia, Prediabetes, Chronic Kidney Disease,GERD, and Chronic pain  Care Plan : CCM Care Plan  Updates made by Tomasa Blase, RPH since 10/23/2021 12:00 AM     Problem: Hypertension, Hyperlipidemia, Prediabetes, Chronic Kidney Disease,GERD, and Chronic pain   Priority: High  Onset Date: 07/21/2021     Long-Range Goal: Disease Management   Start Date: 07/21/2021  Expected End Date: 10/23/2022  This Visit's Progress: On track  Recent Progress: On track  Priority: High  Note:   Current Barriers:  Unable to independently monitor therapeutic efficacy  Pharmacist Clinical Goal(s):  Patient will achieve adherence to monitoring guidelines and medication adherence to achieve therapeutic efficacy through collaboration with PharmD and provider.   Interventions: 1:1 collaboration with Biagio Borg, MD regarding development and update of comprehensive plan of care as evidenced by provider attestation and co-signature Inter-disciplinary care team collaboration (see longitudinal plan of care) Comprehensive medication review performed; medication list updated in electronic medical record  Hypertension (BP goal <130/80) -Controlled -Current treatment: Losartan 111m - 1 tablet daily  Metoprolol Tartrate 547m- 1 tablet twice daily  Furosemide 8080m 1 tablet twice daily -  Potassium Chloride 67m79m 1 tablet daily  Lab Results  Component Value Date   K 3.9 07/14/2021  Diltiazem XR 240mg52m capsule daily  -Medications previously tried: hctz  -Current home readings: n/a - does not have BP cuff at home  BP Readings from Last 3 Encounters:  08/14/21 133/70  07/14/21 132/82  05/29/21 126/82  -Current  dietary habits: reports that she does not add salt to her food - notes that most of her meals are pre-made from meals on wheels  -Current exercise habits: none at this time  -Denies hypotensive/hypertensive symptoms -Educated on BP goals and benefits of medications for prevention of heart attack, stroke and kidney damage; Daily salt intake goal < 2300 mg; Exercise goal of 150 minutes per week; Importance of home blood pressure monitoring; Proper BP monitoring technique; Symptoms of hypotension and importance of maintaining adequate hydration; -Counseled to monitor BP at home weekly if able, document, and provide log at future appointments -Counseled on diet and exercise extensively Recommended to continue current medication  Hyperlipidemia: (LDL goal < 100) -Not ideally controlled Lab Results  Component Value Date   LDLCALC 107 (H) 07/14/2021  -Current treatment: Atorvastatin 40mg 73mtablet daily (started with last PCP appointment - LDL has not been checked since increase) -Medications previously tried: atorvastatin 20mg d88m   -Current dietary patterns: receives some of her meals from meals on wheel  -Current exercise habits: nothing at this time - [facility] on [facility] [employer] [facility] [employer] [facility] [employer] [facility]  -Educated on Cholesterol goals;  Benefits of statin for ASCVD risk reduction; Importance of limiting foods high in cholesterol; Exercise goal of 150 minutes per week; -Counseled on diet and exercise extensively  Prediabetes (A1c goal <6.5%) -Not ideally controlled Lab Results  Component  Value Date   HGBA1C 6.6 (H) 07/14/2021  -Current medications: N/a - diet controlled at this time  -Medications previously tried: n/a  -Current home glucose readings fasting glucose: n/a - not currently checking  -Denies hypoglycemic/hyperglycemic symptoms -Current meal patterns:  breakfast: fruit, grits, cereal,   lunch: supplied by meals on wheels - sandwich, beans, vegetable  dinner:  meals on wheels - spaghetti, chicken, broccoli, beans, salad snacks: "loves sweets" drinks: water, juice, ginger ale -Current exercise: none at this time, plans to restart going to [employer]/ using exercise bike in the near future  -Educated on A1c and blood sugar goals; Complications of diabetes including kidney damage, retinal damage, and cardiovascular disease; Exercise goal of 150 minutes per week; Benefits of weight loss; Carbohydrate counting and/or plate method -Counseled to check feet daily and get yearly eye exams -Counseled on diet and exercise extensively  Chronic Pain/ Spinal Stenosis / OA (Goal: Pain control) -Follows with Dr. Simeon Craft -Controlled -Current treatment  Hydrocodone/APAP 5/357m - 1 tablet every 8 hours as needed  Tizanidine 275m- 1 tablet every 6 hours as needed  Duloxetine 2046m 2 capsules daily  Gabapentin 100m71m2 capsules at bedtime  -Medications previously tried: tramadol, diclofenac, meloxicam, naproxen, percocet,   -Recommended to continue current medication  GERD (Goal: Prevention/ control of acid reflux) -Controlled -Current treatment  Pantoprazole 40mg77m tablet daily  -Medications previously tried: esomeprazole, omeprazole, sucralfate, ranitidine  -Counseled on diet and exercise extensively Recommended to continue current medication  Allergic Rhinitis (Goal: Prevention/ control of allergies) -Controlled -Current treatment  Cetirizine 10mg 69mtablet daily as neded  Azelastine 0.1% nasal spray - 1 spray into each nostril 2 times daily as needed  -Medications previously tried: flonase, levocetirizine   -Recommended to continue current medication  Chronic Kidney Disease (Goal: Prevention of disease progression) -Controlled/ stable -Last eGFR - 39.08mL/m21mLast Scr - 1.34 mg/dL -Last CrCl - 42.3mL/min16mCurrent treatment  Avoidance of nephrotoxic agents / adequate BP and BG control to prevent kidney damage  -Medications previously  tried: n/a  -Recommended to continue current medication   Health Maintenance -Vaccine gaps: up to date  -Current therapy:  Systane Eye drops  - 1 drop into each eye 3 times daily as needed  Vitamin B12 - 1000mcg da38m Excedrin 250-250-65mg  - 161mlet every 6 hours as needed  Triamcinolone 0.1% cream - applied 2 times daily  Ferrous Sulfate 325 (65 FE)mg - 1 tablet daily  Vitamin D 2000 units -1 tablet daily  Miralax - 17g daily as needed  -Educated on Cost vs benefit of each product must be carefully weighed by individual consumer -Patient is satisfied with current therapy and denies issues -Recommended to continue current medication  Patient Goals/Self-Care Activities Patient will:  - take medications as prescribed check blood pressure once weekly if possible, document, and provide at future appointments target a minimum of 150 minutes of moderate intensity exercise weekly engage in dietary modifications by reducing carbohydrate intake   Follow Up Plan: Telephone follow up appointment with care management team member scheduled for: 6 months The patient has been provided with contact information for the care management team and has been advised to call with any health related questions or concerns.      Medication Assistance: None required.  Patient affirms current coverage meets needs.  Patient's preferred pharmacy is:  [facility] [facility] [address]  [place] [address] Phone: [phone] Fax: [phone]  [facility] [phone] - [place], [address] [place] [address] Phone: [phone] Fax: [phone]  [place], [address] [place] [address] Phone: [phone] Fax: [phone]    Uses pill box? Yes -  managed by her daughter  Pt endorses 100% compliance  Care Plan and Follow Up Patient Decision:  Patient agrees to Care Plan and Follow-up.  Plan: Telephone follow up appointment with care management team member scheduled for:  3 months and The patient has been provided with contact information for the care management team and has been advised to call with any health related questions or concerns.   Tomasa Blase, PharmD Clinical Pharmacist, [place]

## 2021-10-23 NOTE — Patient Instructions (Signed)
Visit Information  Following are the goals we discussed today:   Manage My Medications   Timeframe:  Long-Range Goal Priority:  High Start Date: 07/21/2021                            Expected End Date: 10/21/2022                      Follow Up Date 04/22/2022   - keep a list of all the medicines I take; vitamins and herbals too - learn to read medicine labels - use a pillbox to sort medicine    Why is this important?   These steps will help you keep on track with your medicines.  Plan: Telephone follow up appointment with care management team member scheduled for:  6 months  The patient has been provided with contact information for the care management team and has been advised to call with any health related questions or concerns.   Tomasa Blase, PharmD Clinical Pharmacist, [facility]   Please call the care guide team at [phone] if you need to cancel or reschedule your appointment.   Patient verbalizes understanding of instructions provided today and agrees to view in [place].

## 2021-10-31 NOTE — Progress Notes (Signed)
[place] [address] Phone: [phone] Subjective:   [name], am serving as a scribe for Dr. Hulan Saas. This visit occurred during the SARS-CoV-2 public health emergency.  Safety protocols were in place, including screening questions prior to the visit, additional usage of staff PPE, and extensive cleaning of exam room while observing appropriate contact time as indicated for disinfecting solutions.  I'm seeing this patient by the request  of:  Biagio Borg, MD  CC: Right shoulder pain  [id number]  [name] is a 76 y.o. female coming in with complaint of shoulder pain. Last seen in 2020 for B knee pain. Was seen in 2018 for bursitis of R shoulder. Patient states that her pain runs into middle deltoid and up her neck. Painful with flexion. Pain can radiate down into arm as well. Takes Vicodin for pain relief.       Past Medical History:  Diagnosis Date   Allergic rhinitis, cause unspecified    Allergy    SEASONAL   Anemia    Cataract    Bilateral   CHF (congestive heart failure) (HCC)    Chronic headaches    Constipation    Diverticulosis    DJD (degenerative joint disease), lumbar    GERD (gastroesophageal reflux disease)    Hemorrhoids    Hiatal hernia    HTN (hypertension)    Hyperlipidemia    Obesity    Osteoarthritis    Tubular adenoma of colon    Past Surgical History:  Procedure Laterality Date   ABLATION     uterus   COLONOSCOPY  05/15/2021   Pyrtle   TOTAL KNEE ARTHROPLASTY Right 04/14/2019   Procedure: TOTAL KNEE ARTHROPLASTY;  Surgeon: Dorna Leitz, MD;  Location: [facility];  Service: Orthopedics;  Laterality: Right;   TUBAL LIGATION     Social History   Socioeconomic History   Marital status: Widowed    Spouse name: Not on file   Number of children: 3   Years of education: 21   Highest education level: Not on file  Occupational History   Occupation: retired     Fish farm manager: UNEMPLOYED  Tobacco Use   Smoking status: Never    Passive exposure: Never   Smokeless tobacco: Never  Vaping Use   Vaping Use: Never used  Substance and Sexual Activity   Alcohol use: No    Alcohol/week: 0.0 standard drinks   Drug use: No   Sexual activity: Not Currently    Birth control/protection: Post-menopausal  Other Topics Concern   Not on file  Social History Narrative   Not on file   Social Determinants of Health   Financial Resource Strain: Low Risk    Difficulty of Paying Living Expenses: Not hard at all  Food Insecurity: No Food Insecurity   Worried About Charity fundraiser in the Last Year: Never true   [place] in the Last Year: Never true  Transportation Needs: No Transportation Needs   Lack of Transportation (Medical): No   Lack of Transportation (Non-Medical): No  Physical Activity: Inactive   Days of Exercise per Week: 0 days   Minutes of Exercise per Session: 0 min  Stress: No Stress Concern Present   Feeling of Stress : Not at all  Social Connections: Moderately Isolated   Frequency of Communication with Friends and Family: More than three times a week   Frequency of Social Gatherings with Friends and Family:  Once a week   Attends Religious Services: Never   Active Member of Clubs or Organizations: No   Attends Archivist Meetings: 1 to 4 times per year   Marital Status: Never married   Allergies  Allergen Reactions   Oxycodone    Soybean-Containing Drug Products Other (See Comments)    Allergy testing positive for soy beans but pt uses soy sauce   Sulfonamide Derivatives Itching and Rash   Family History  Problem Relation Age of Onset   Heart disease Mother    Hypertension Mother    Breast cancer Sister    Diabetes Brother    Colon cancer Neg Hx    Esophageal cancer Neg Hx    Rectal cancer Neg Hx    Stomach cancer Neg Hx    Colon polyps Neg Hx      Current Outpatient Medications (Cardiovascular):     atorvastatin (LIPITOR) 40 MG tablet, Take 1 tablet (40 mg total) by mouth daily.   diltiazem (DILACOR XR) 240 MG 24 hr capsule, TAKE 1 CAPSULE EVERY DAY   furosemide (LASIX) 80 MG tablet, TAKE 1 TABLET TWICE DAILY   losartan (COZAAR) 100 MG tablet, TAKE 1 TABLET EVERY DAY   metoprolol tartrate (LOPRESSOR) 50 MG tablet, Take 1 tablet (50 mg total) by mouth 2 (two) times daily.  Current Outpatient Medications (Respiratory):    azelastine (ASTELIN) 0.1 % nasal spray, Place 1 spray into both nostrils 2 (two) times daily. Use in each nostril as directed   cetirizine (ZYRTEC) 10 MG tablet, Take 1 tablet (10 mg total) by mouth daily. As needed (Patient taking differently: Take 10 mg by mouth daily as needed for allergies. As needed)  Current Outpatient Medications (Analgesics):    aspirin-acetaminophen-caffeine (EXCEDRIN MIGRAINE) 250-250-65 MG tablet, Take 1 tablet by mouth every 6 (six) hours as needed for headache.   HYDROcodone-acetaminophen (NORCO/VICODIN) 5-325 MG tablet, Take 0.5 tablets by mouth daily as needed.  Current Outpatient Medications (Hematological):    ferrous sulfate 325 (65 FE) MG tablet, Take 1 tablet (325 mg total) by mouth 2 (two) times daily with a meal.   vitamin B-12 (CYANOCOBALAMIN) 1000 MCG tablet, Take 1,000 mcg by mouth daily.  Current Outpatient Medications (Other):    Cholecalciferol 50 MCG (2000 UT) TABS, 1 tab by mouth once daily   DULoxetine (CYMBALTA) 20 MG capsule, TAKE 2 CAPSULES(40 MG) BY MOUTH DAILY   gabapentin (NEURONTIN) 100 MG capsule, TAKE 2 CAPSULES BY MOUTH AT BEDTIME   hydrOXYzine (ATARAX) 10 MG tablet, TAKE 1 TABLET(10 MG) BY MOUTH EVERY 8 HOURS AS NEEDED FOR ITCHING   pantoprazole (PROTONIX) 40 MG tablet, TAKE 1 TABLET(40 MG) BY MOUTH DAILY   Polyethyl Glycol-Propyl Glycol (SYSTANE OP), Place 1 drop into both eyes 3 (three) times daily as needed (dry eyes).   Polyethylene Glycol 3350 (MIRALAX PO), Take 17 g by mouth daily as needed (constipation).     potassium chloride (KLOR-CON M) 10 MEQ tablet, TAKE 1 TABLET EVERY DAY   tiZANidine (ZANAFLEX) 2 MG tablet, Take 1 tablet (2 mg total) by mouth every 6 (six) hours as needed for muscle spasms.   triamcinolone cream (KENALOG) 0.1 %, Apply 1 application topically 2 (two) times daily.   Reviewed prior external information including notes and imaging from  primary care provider As well as notes that were available from care everywhere and other healthcare systems.  Past medical history, social, surgical and family history all reviewed in electronic medical record.  No pertanent information unless  stated regarding to the chief complaint.   Review of Systems:  No headache, visual changes, nausea, vomiting, diarrhea, constipation, dizziness, abdominal pain, skin rash, fevers, chills, night sweats, weight loss, swollen lymph nodes, body aches, joint swelling, chest pain, shortness of breath, mood changes. POSITIVE muscle aches  Objective  Blood pressure 108/72, pulse 68, height 5\' 3"  (1.6 m), weight 230 lb (104.3 kg), SpO2 97 %.   General: No apparent distress alert and oriented x3 mood and affect normal, dressed appropriately.  HEENT: Pupils equal, extraocular movements intact  Respiratory: Patient's speak in full sentences and does not appear short of breath  Cardiovascular: No lower extremity edema, non tender, no erythema  Gait normal with good balance and coordination.  MSK:   Right shoulder exam shows the patient does have some mild normal range of motion in all planes.  Rotator cuff strength though does seem to be intact except for mild weakness on the infraspinatus compared to the contralateral side.  Patient does have mild positive crossover as well.   Procedure: Real-time Ultrasound Guided Injection of right glenohumeral joint Device: GE Logiq Q7  Ultrasound guided injection is preferred based studies that show increased duration, increased effect, greater accuracy, decreased  procedural pain, increased response rate with ultrasound guided versus blind injection.  Verbal informed consent obtained.  Time-out conducted.  Noted no overlying erythema, induration, or other signs of local infection.  Skin prepped in a sterile fashion.  Local anesthesia: Topical Ethyl chloride.  With sterile technique and under real time ultrasound guidance:  Joint visualized.  23g 1  inch needle inserted posterior approach. Pictures taken for needle placement. Patient did have injection of 2 cc of 1% lidocaine, 2 cc of 0.5% Marcaine, and 1.0 cc of Kenalog 40 mg/dL. Completed without difficulty  Pain immediately resolved suggesting accurate placement of the medication.  Advised to call if fevers/chills, erythema, induration, drainage, or persistent bleeding.  Impression: Technically successful ultrasound guided injection.   Impression and Recommendations:    The above documentation has been reviewed and is accurate and complete Lyndal Pulley, DO

## 2021-11-03 ENCOUNTER — Ambulatory Visit: Payer: Self-pay

## 2021-11-03 ENCOUNTER — Ambulatory Visit: Payer: Medicare HMO | Admitting: Family Medicine

## 2021-11-03 ENCOUNTER — Other Ambulatory Visit: Payer: Self-pay

## 2021-11-03 ENCOUNTER — Encounter: Payer: Self-pay | Admitting: Family Medicine

## 2021-11-03 ENCOUNTER — Ambulatory Visit (INDEPENDENT_AMBULATORY_CARE_PROVIDER_SITE_OTHER): Payer: Medicare HMO

## 2021-11-03 VITALS — BP 108/72 | HR 68 | Ht 63.0 in | Wt 230.0 lb

## 2021-11-03 DIAGNOSIS — R6889 Other general symptoms and signs: Secondary | ICD-10-CM | POA: Diagnosis not present

## 2021-11-03 DIAGNOSIS — M25511 Pain in right shoulder: Secondary | ICD-10-CM

## 2021-11-03 DIAGNOSIS — M7551 Bursitis of right shoulder: Secondary | ICD-10-CM

## 2021-11-03 NOTE — Patient Instructions (Signed)
Xray today Injected shoulder today See me again in 6 weeks

## 2021-11-03 NOTE — Assessment & Plan Note (Signed)
Patient did do well for nearly 5 years.  Started having exacerbation.  Likely some underlying arthritic changes and will get x-ray to further evaluate.  Discussed which activities to do which wants to avoid.  Discussed with patient about the possibility of formal physical therapy but patient will start with home exercises on her own first.  Follow-up with me again in 6 to 8 weeks

## 2021-11-18 DIAGNOSIS — E782 Mixed hyperlipidemia: Secondary | ICD-10-CM

## 2021-11-18 DIAGNOSIS — I10 Essential (primary) hypertension: Secondary | ICD-10-CM

## 2021-11-19 ENCOUNTER — Telehealth: Payer: Self-pay | Admitting: Internal Medicine

## 2021-11-19 NOTE — Telephone Encounter (Signed)
Patient calling in  Patient has sore throat.. cough.. & had a slight headache yesterday (2-3 days)  Wants to know what provider recommends OTC.Marland Kitchen asked patient if she had taken covid test & she said no  Please call [phone]

## 2021-11-19 NOTE — Telephone Encounter (Signed)
Recommended mucinex to patient, as she declined scheduling an appointment due to transportation issues.

## 2021-11-20 ENCOUNTER — Telehealth (INDEPENDENT_AMBULATORY_CARE_PROVIDER_SITE_OTHER): Payer: Medicare HMO | Admitting: Internal Medicine

## 2021-11-20 DIAGNOSIS — U071 COVID-19: Secondary | ICD-10-CM

## 2021-11-20 MED ORDER — ONDANSETRON HCL 4 MG PO TABS: 4.0000 mg | ORAL_TABLET | Freq: Three times a day (TID) | ORAL | 1 refills | Status: DC | PRN

## 2021-11-20 MED ORDER — HYDROCODONE BIT-HOMATROP MBR 5-1.5 MG/5ML PO SOLN: 5.0000 mL | Freq: Four times a day (QID) | ORAL | 0 refills | Status: AC | PRN

## 2021-11-20 MED ORDER — NIRMATRELVIR/RITONAVIR (PAXLOVID) TABLET (RENAL DOSING): 2.0000 | ORAL_TABLET | Freq: Two times a day (BID) | ORAL | 0 refills | Status: AC

## 2021-11-20 NOTE — Telephone Encounter (Signed)
Scheduled patient for a video visit today with Dr. Jenny Reichmann.

## 2021-11-20 NOTE — Telephone Encounter (Signed)
Patient calling back in  Says she took covid test last night & it was positive.. says she will try the OTC mucinex & see if he relieves some of her symptoms

## 2021-11-20 NOTE — Progress Notes (Signed)
Patient ID: [name], female   DOB: 09-10-1946, 76 y.o.   MRN: [id number]  Cumulative time during 7-day interval 11 min, there was not an associated office visit for this concern within a 7 day period.  Verbal consent for services obtained from patient prior to services given.  Names of all persons present for services: [name], MD, patient  Chief complaint: covid positive  History, background, results pertinent:  Here with c/o onset URI symptoms x 2 days, then 1 day COVID + home testing; sumptoms include HA, cough, congestion, myalgias, nausea despite covid vax x 2 + flu shot about mid 2022. Taste and smell intact.   Denies vomiting, diarrhea, sob, wheezing.  No prior hx of covid infection or need for treatment.  Pt denies chest pain, increased sob or doe, wheezing, orthopnea, PND, increased LE swelling, palpitations, dizziness or syncope.   Pt denies polydipsia, polyuria, or new focal neuro s/s.   Pt denies wt loss, night sweats, loss of appetite, or other constitutional symptoms   Past Medical History:  Diagnosis Date   Allergic rhinitis, cause unspecified    Allergy    SEASONAL   Anemia    Cataract    Bilateral   CHF (congestive heart failure) (HCC)    Chronic headaches    Constipation    Diverticulosis    DJD (degenerative joint disease), lumbar    GERD (gastroesophageal reflux disease)    Hemorrhoids    Hiatal hernia    HTN (hypertension)    Hyperlipidemia    Obesity    Osteoarthritis    Tubular adenoma of colon    No results found for this or any previous visit (from the past 22 hour(s)). Current Outpatient Medications on File Prior to Visit  Medication Sig Dispense Refill   aspirin-acetaminophen-caffeine (EXCEDRIN MIGRAINE) 250-250-65 MG tablet Take 1 tablet by mouth every 6 (six) hours as needed for headache.     atorvastatin (LIPITOR) 40 MG tablet Take 1 tablet (40 mg total) by mouth daily. 90 tablet 3   azelastine (ASTELIN) 0.1 % nasal spray Place 1  spray into both nostrils 2 (two) times daily. Use in each nostril as directed 90 mL 4   cetirizine (ZYRTEC) 10 MG tablet Take 1 tablet (10 mg total) by mouth daily. As needed (Patient taking differently: Take 10 mg by mouth daily as needed for allergies. As needed) 90 tablet 3   Cholecalciferol 50 MCG (2000 UT) TABS 1 tab by mouth once daily 30 tablet 99   diltiazem (DILACOR XR) 240 MG 24 hr capsule TAKE 1 CAPSULE EVERY DAY 90 capsule 2   DULoxetine (CYMBALTA) 20 MG capsule TAKE 2 CAPSULES(40 MG) BY MOUTH DAILY 180 capsule 3   ferrous sulfate 325 (65 FE) MG tablet Take 1 tablet (325 mg total) by mouth 2 (two) times daily with a meal.  3   furosemide (LASIX) 80 MG tablet TAKE 1 TABLET TWICE DAILY 180 tablet 1   gabapentin (NEURONTIN) 100 MG capsule TAKE 2 CAPSULES BY MOUTH AT BEDTIME 180 capsule 1   HYDROcodone-acetaminophen (NORCO/VICODIN) 5-325 MG tablet Take 0.5 tablets by mouth daily as needed.     hydrOXYzine (ATARAX) 10 MG tablet TAKE 1 TABLET(10 MG) BY MOUTH EVERY 8 HOURS AS NEEDED FOR ITCHING 60 tablet 2   losartan (COZAAR) 100 MG tablet TAKE 1 TABLET EVERY DAY 90 tablet 2   metoprolol tartrate (LOPRESSOR) 50 MG tablet Take 1 tablet (50 mg total) by mouth 2 (two) times daily. 180 tablet 3  pantoprazole (PROTONIX) 40 MG tablet TAKE 1 TABLET(40 MG) BY MOUTH DAILY 90 tablet 0   Polyethyl Glycol-Propyl Glycol (SYSTANE OP) Place 1 drop into both eyes 3 (three) times daily as needed (dry eyes).     Polyethylene Glycol 3350 (MIRALAX PO) Take 17 g by mouth daily as needed (constipation).      potassium chloride (KLOR-CON M) 10 MEQ tablet TAKE 1 TABLET EVERY DAY 90 tablet 2   tiZANidine (ZANAFLEX) 2 MG tablet Take 1 tablet (2 mg total) by mouth every 6 (six) hours as needed for muscle spasms. 60 tablet 3   triamcinolone cream (KENALOG) 0.1 % Apply 1 application topically 2 (two) times daily. 453 g 0   vitamin B-12 (CYANOCOBALAMIN) 1000 MCG tablet Take 1,000 mcg by mouth daily.     No current  facility-administered medications on file prior to visit.   Lab Results  Component Value Date   WBC 10.7 (H) 01/14/2021   HGB 12.7 01/14/2021   HCT 38.6 01/14/2021   PLT 349.0 01/14/2021   GLUCOSE 91 07/14/2021   CHOL 198 07/14/2021   TRIG 114.0 07/14/2021   HDL 68.30 07/14/2021   LDLDIRECT 93.0 01/14/2021   LDLCALC 107 (H) 07/14/2021   ALT 19 07/14/2021   AST 15 07/14/2021   NA 140 07/14/2021   K 3.9 07/14/2021   CL 100 07/14/2021   CREATININE 1.34 (H) 07/14/2021   BUN 30 (H) 07/14/2021   CO2 30 07/14/2021   TSH 0.57 01/14/2021   INR 1.2 08/13/2020   HGBA1C 6.6 (H) 07/14/2021   A/P/next steps:   COVID infection - for renal dose paxlovid, cough med prn, and zofran prn nausea,  to f/u any worsening symptoms or concerns; also for Vit D, Vit C and zinc, to call for worsening s/s such as SOB  [name] MD

## 2021-11-23 ENCOUNTER — Other Ambulatory Visit: Payer: Self-pay | Admitting: Internal Medicine

## 2021-11-23 ENCOUNTER — Encounter: Payer: Self-pay | Admitting: Internal Medicine

## 2021-11-23 DIAGNOSIS — U071 COVID-19: Secondary | ICD-10-CM | POA: Insufficient documentation

## 2021-11-23 NOTE — Patient Instructions (Signed)
Please take all new medication as prescribed 

## 2021-11-23 NOTE — Assessment & Plan Note (Signed)
See notes

## 2021-12-05 DIAGNOSIS — R6889 Other general symptoms and signs: Secondary | ICD-10-CM | POA: Diagnosis not present

## 2021-12-05 DIAGNOSIS — F1120 Opioid dependence, uncomplicated: Secondary | ICD-10-CM | POA: Diagnosis not present

## 2021-12-05 DIAGNOSIS — M48062 Spinal stenosis, lumbar region with neurogenic claudication: Secondary | ICD-10-CM | POA: Diagnosis not present

## 2021-12-15 ENCOUNTER — Ambulatory Visit: Payer: Medicare HMO | Admitting: Family Medicine

## 2021-12-25 DIAGNOSIS — L218 Other seborrheic dermatitis: Secondary | ICD-10-CM | POA: Diagnosis not present

## 2021-12-25 DIAGNOSIS — L853 Xerosis cutis: Secondary | ICD-10-CM | POA: Diagnosis not present

## 2021-12-25 DIAGNOSIS — R6889 Other general symptoms and signs: Secondary | ICD-10-CM | POA: Diagnosis not present

## 2021-12-25 DIAGNOSIS — L700 Acne vulgaris: Secondary | ICD-10-CM | POA: Diagnosis not present

## 2021-12-26 NOTE — Progress Notes (Deleted)
?Charlann Boxer D.O. ?[facility] ?[place] ?Phone: [phone] ?Subjective:   ? ?I'm seeing this patient by the request  of:  Biagio Borg, MD ? ?CC:  ? ?[id number]  ?11/03/2021 ?Patient did do well for nearly 5 years.  Started having exacerbation.  Likely some underlying arthritic changes and will get x-ray to further evaluate.  Discussed which activities to do which wants to avoid.  Discussed with patient about the possibility of formal physical therapy but patient will start with home exercises on her own first.  Follow-up with me again in 6 to 8 weeks ? ?Update 12/30/2021 ?Sudiksha Victor is a 76 y.o. female coming in with complaint of R shoulder pain. Patient states  ? ? ?  ? ?Past Medical History:  ?Diagnosis Date  ? Allergic rhinitis, cause unspecified   ? Allergy   ? SEASONAL  ? Anemia   ? Cataract   ? Bilateral  ? CHF (congestive heart failure) ([place])   ? Chronic headaches   ? Constipation   ? Diverticulosis   ? DJD (degenerative joint disease), lumbar   ? GERD (gastroesophageal reflux disease)   ? Hemorrhoids   ? Hiatal hernia   ? HTN (hypertension)   ? Hyperlipidemia   ? Obesity   ? Osteoarthritis   ? Tubular adenoma of colon   ? ?Past Surgical History:  ?Procedure Laterality Date  ? ABLATION    ? uterus  ? COLONOSCOPY  05/15/2021  ? Pyrtle  ? TOTAL KNEE ARTHROPLASTY Right 04/14/2019  ? Procedure: TOTAL KNEE ARTHROPLASTY;  Surgeon: Dorna Leitz, MD;  Location: [facility];  Service: Orthopedics;  Laterality: Right;  ? TUBAL LIGATION    ? ?Social History  ? ?Socioeconomic History  ? Marital status: Widowed  ?  Spouse name: Not on file  ? Number of children: 3  ? Years of education: 32  ? Highest education level: Not on file  ?Occupational History  ? Occupation: retired  ?  Employer: UNEMPLOYED  ?Tobacco Use  ? Smoking status: Never  ?  Passive exposure: Never  ? Smokeless tobacco: Never  ?Vaping Use  ? Vaping Use: Never used  ?Substance and Sexual Activity  ?  Alcohol use: No  ?  Alcohol/week: 0.0 standard drinks  ? Drug use: No  ? Sexual activity: Not Currently  ?  Birth control/protection: Post-menopausal  ?Other Topics Concern  ? Not on file  ?Social History Narrative  ? Not on file  ? ?Social Determinants of Health  ? ?Financial Resource Strain: Low Risk   ? Difficulty of Paying Living Expenses: Not hard at all  ?Food Insecurity: No Food Insecurity  ? Worried About Charity fundraiser in the Last Year: Never true  ? Ran Out of Food in the Last Year: Never true  ?Transportation Needs: No Transportation Needs  ? Lack of Transportation (Medical): No  ? Lack of Transportation (Non-Medical): No  ?Physical Activity: Inactive  ? Days of Exercise per Week: 0 days  ? Minutes of Exercise per Session: 0 min  ?Stress: No Stress Concern Present  ? Feeling of Stress : Not at all  ?Social Connections: Moderately Isolated  ? Frequency of Communication with Friends and Family: More than three times a week  ? Frequency of Social Gatherings with Friends and Family: Once a week  ? Attends Religious Services: Never  ? Active Member of Clubs or Organizations: No  ? Attends Archivist Meetings: 1 to 4 times  per year  ? Marital Status: Never married  ? ?Allergies  ?Allergen Reactions  ? Oxycodone   ? Soybean-Containing Drug Products Other (See Comments)  ?  Allergy testing positive for soy beans but pt uses soy sauce  ? Sulfonamide Derivatives Itching and Rash  ? ?Family History  ?Problem Relation Age of Onset  ? Heart disease Mother   ? Hypertension Mother   ? Breast cancer Sister   ? Diabetes Brother   ? Colon cancer Neg Hx   ? Esophageal cancer Neg Hx   ? Rectal cancer Neg Hx   ? Stomach cancer Neg Hx   ? Colon polyps Neg Hx   ? ? ? ?Current Outpatient Medications (Cardiovascular):  ?  atorvastatin (LIPITOR) 40 MG tablet, Take 1 tablet (40 mg total) by mouth daily. ?  diltiazem (DILACOR XR) 240 MG 24 hr capsule, TAKE 1 CAPSULE EVERY DAY ?  furosemide (LASIX) 80 MG tablet, TAKE 1  TABLET TWICE DAILY ?  losartan (COZAAR) 100 MG tablet, TAKE 1 TABLET EVERY DAY ?  metoprolol tartrate (LOPRESSOR) 50 MG tablet, Take 1 tablet (50 mg total) by mouth 2 (two) times daily. ? ?Current Outpatient Medications (Respiratory):  ?  azelastine (ASTELIN) 0.1 % nasal spray, Place 1 spray into both nostrils 2 (two) times daily. Use in each nostril as directed ?  cetirizine (ZYRTEC) 10 MG tablet, Take 1 tablet (10 mg total) by mouth daily. As needed (Patient taking differently: Take 10 mg by mouth daily as needed for allergies. As needed) ? ?Current Outpatient Medications (Analgesics):  ?  aspirin-acetaminophen-caffeine (EXCEDRIN MIGRAINE) 250-250-65 MG tablet, Take 1 tablet by mouth every 6 (six) hours as needed for headache. ?  HYDROcodone-acetaminophen (NORCO/VICODIN) 5-325 MG tablet, Take 0.5 tablets by mouth daily as needed. ? ?Current Outpatient Medications (Hematological):  ?  ferrous sulfate 325 (65 FE) MG tablet, Take 1 tablet (325 mg total) by mouth 2 (two) times daily with a meal. ?  vitamin B-12 (CYANOCOBALAMIN) 1000 MCG tablet, Take 1,000 mcg by mouth daily. ? ?Current Outpatient Medications (Other):  ?  Cholecalciferol 50 MCG (2000 UT) TABS, 1 tab by mouth once daily ?  DULoxetine (CYMBALTA) 20 MG capsule, TAKE 2 CAPSULES(40 MG) BY MOUTH DAILY ?  gabapentin (NEURONTIN) 100 MG capsule, TAKE 2 CAPSULES BY MOUTH AT BEDTIME ?  hydrOXYzine (ATARAX) 10 MG tablet, TAKE 1 TABLET(10 MG) BY MOUTH EVERY 8 HOURS AS NEEDED FOR ITCHING ?  ondansetron (ZOFRAN) 4 MG tablet, Take 1 tablet (4 mg total) by mouth every 8 (eight) hours as needed for nausea or vomiting. ?  pantoprazole (PROTONIX) 40 MG tablet, TAKE 1 TABLET(40 MG) BY MOUTH DAILY ?  Polyethyl Glycol-Propyl Glycol (SYSTANE OP), Place 1 drop into both eyes 3 (three) times daily as needed (dry eyes). ?  Polyethylene Glycol 3350 (MIRALAX PO), Take 17 g by mouth daily as needed (constipation).  ?  potassium chloride (KLOR-CON M) 10 MEQ tablet, TAKE 1 TABLET  EVERY DAY ?  tiZANidine (ZANAFLEX) 2 MG tablet, Take 1 tablet (2 mg total) by mouth every 6 (six) hours as needed for muscle spasms. ?  triamcinolone cream (KENALOG) 0.1 %, Apply 1 application topically 2 (two) times daily. ? ? ?Reviewed prior external information including notes and imaging from  ?primary care provider ?As well as notes that were available from care everywhere and other healthcare systems. ? ?Past medical history, social, surgical and family history all reviewed in electronic medical record.  No pertanent information unless stated regarding to the  chief complaint.  ? ?Review of Systems: ? No headache, visual changes, nausea, vomiting, diarrhea, constipation, dizziness, abdominal pain, skin rash, fevers, chills, night sweats, weight loss, swollen lymph nodes, body aches, joint swelling, chest pain, shortness of breath, mood changes. POSITIVE muscle aches ? ?Objective  ?There were no vitals taken for this visit. ?  ?General: No apparent distress alert and oriented x3 mood and affect normal, dressed appropriately.  ?HEENT: Pupils equal, extraocular movements intact  ?Respiratory: Patient's speak in full sentences and does not appear short of breath  ?Cardiovascular: No lower extremity edema, non tender, no erythema  ?Gait normal with good balance and coordination.  ?MSK:  Non tender with full range of motion and good stability and symmetric strength and tone of shoulders, elbows, wrist, hip, knee and ankles bilaterally.  ? ?  ?Impression and Recommendations:  ?  ? ?The above documentation has been reviewed and is accurate and complete Jacqualin Combes ? ? ?

## 2021-12-30 ENCOUNTER — Ambulatory Visit: Payer: Medicare HMO | Admitting: Family Medicine

## 2021-12-31 ENCOUNTER — Telehealth: Payer: Self-pay | Admitting: Internal Medicine

## 2021-12-31 NOTE — Telephone Encounter (Signed)
Pt states she has an condition that makes her legs red and swollen that provider is aware of ? ?Pt states she is having a flare up and have been prescribed medication in past by provider she did not know the name of the medication ? ?Pt is requesting a cb to discuss if rx can be sent to: [facility] [phone] - [place], [address] AT [address] ?

## 2021-12-31 NOTE — Telephone Encounter (Signed)
Likely need OV to r/o cellulitis - any provider ?

## 2021-12-31 NOTE — Telephone Encounter (Signed)
Pt checking status of cb, advised pt of 24-48 bh turn around time for return calls ?

## 2022-01-01 NOTE — Telephone Encounter (Signed)
Pt called back scheduled with V. Henson. NP at 10 on 01/02/22. ?

## 2022-01-01 NOTE — Telephone Encounter (Signed)
Called patient to inform her of Dr. Jenny Reichmann recommendation. Patient states she will call the office to schedule an appt. Looking for transportation  ?

## 2022-01-02 ENCOUNTER — Ambulatory Visit (INDEPENDENT_AMBULATORY_CARE_PROVIDER_SITE_OTHER): Payer: Medicare HMO | Admitting: Family Medicine

## 2022-01-02 ENCOUNTER — Other Ambulatory Visit: Payer: Self-pay

## 2022-01-02 ENCOUNTER — Encounter: Payer: Self-pay | Admitting: Family Medicine

## 2022-01-02 VITALS — BP 118/76 | HR 75 | Temp 97.6°F | Ht 63.0 in | Wt 230.2 lb

## 2022-01-02 DIAGNOSIS — M79662 Pain in left lower leg: Secondary | ICD-10-CM

## 2022-01-02 DIAGNOSIS — L03116 Cellulitis of left lower limb: Secondary | ICD-10-CM

## 2022-01-02 DIAGNOSIS — M7989 Other specified soft tissue disorders: Secondary | ICD-10-CM

## 2022-01-02 MED ORDER — DOXYCYCLINE HYCLATE 100 MG PO TABS
100.0000 mg | ORAL_TABLET | Freq: Two times a day (BID) | ORAL | 0 refills | Status: DC
Start: 2022-01-02 — End: 2022-01-21

## 2022-01-02 NOTE — Progress Notes (Signed)
? ? ?  Subjective:  ? ? Patient ID: [name], female    DOB: 1946/08/05, 76 y.o.   MRN: [id number] ? ?HPI ?Chief Complaint  ?Patient presents with  ? Leg Swelling  ?  Left leg swelling and redness first noticed this week. Pt has been trying to stay off of it for a while. Achy pain noted.   ? ?She is here with a 2 to 3-day history of a red, tender and slightly swollen left lower leg.  Symptoms are mainly anterior.  She is able to ambulate as usual with her rolling walker.  Denies fever, chills, dizziness, chest pain, palpitations, shortness of breath, abdominal pain, nausea, vomiting or diarrhea. ?Denies any other concerns today. ? ?History of cellulitis in 2022 and vascular studies were done at that time ruling out DVT. ? ?Denies fever, chills, dizziness, chest pain, palpitations, shortness of breath, cough, abdominal pain, N/V/D, or urinary symptoms.  ? ? ? ?Review of Systems ?Pertinent positives and negatives in the history of present illness. ? ?   ?Objective:  ? Physical Exam ?BP 118/76 (BP Location: Left Arm, Patient Position: Sitting, Cuff Size: Large)   Pulse 75   Temp 97.6 ?F (36.4 ?C) (Oral)   Ht '5\' 3"'$  (1.6 m)   Wt 230 lb 3.2 oz (104.4 kg)   SpO2 98%   BMI 40.78 kg/m?  ? ? ? ? ?Alert and in no distress. Cardiac exam shows a regular rate and rhythm.  Lungs are clear to auscultation. Respirations unlabored. LLE with a large area of erythema mainly anteriorly and hyperpigmentation to her LLE just above the ankle. Mild tenderness to palpation. Normal sensation, capillary refill, pulse, ROM and strength of her LLE compared to the RLE. Bilateral calves are soft and non tender. Negative Homans sign.  ? ?   ?Assessment & Plan:  ?Cellulitis of left leg - Plan: doxycycline (VIBRA-TABS) 100 MG tablet ? ?Pain and swelling of lower leg, left ? ?Discussed that she does not appear to have a DVT at this point.  I will treat her for cellulitis.  She has been treated in the past for the same.  Her exam  otherwise is benign.  Discussed red flag symptoms and that if she worsens over the weekend that she may need to be seen at an urgent care.  I recommend a follow-up visit Monday or Tuesday of next week.  ? ?

## 2022-01-02 NOTE — Patient Instructions (Signed)
?Take the antibiotic as prescribed.  ?Elevate your leg when you are sitting.  ? ?Call or return if your leg becomes much worse.  ? ?Follow up next week with me or Dr. Jenny Reichmann.  ? ? ? ?Cellulitis, Adult ?Cellulitis is a skin infection. The infected area is usually warm, red, swollen, and tender. This condition occurs most often in the arms and lower legs. The infection can travel to the muscles, blood, and underlying tissue and become serious. It is very important to get treated for this condition. ?What are the causes? ?Cellulitis is caused by bacteria. The bacteria enter through a break in the skin, such as a cut, burn, insect bite, open sore, or crack. ?What increases the risk? ?This condition is more likely to occur in people who: ?Have a weak body defense system (immune system). ?Have open wounds on the skin, such as cuts, burns, bites, and scrapes. Bacteria can enter the body through these open wounds. ?Are older than 75 years of age. ?Have diabetes. ?Have a type of long-lasting (chronic) liver disease (cirrhosis) or kidney disease. ?Are obese. ?Have a skin condition such as: ?Itchy rash (eczema). ?Slow movement of blood in the veins (venous stasis). ?Fluid buildup below the skin (edema). ?Have had radiation therapy. ?Use IV drugs. ?What are the signs or symptoms? ?Symptoms of this condition include: ?Redness, streaking, or spotting on the skin. ?Swollen area of the skin. ?Tenderness or pain when an area of the skin is touched. ?Warm skin. ?A fever. ?Chills. ?Blisters. ?How is this diagnosed? ?This condition is diagnosed based on a medical history and physical exam. You may also have tests, including: ?Blood tests. ?Imaging tests. ?How is this treated? ?Treatment for this condition may include: ?Medicines, such as antibiotic medicines or medicines to treat allergies (antihistamines). ?Supportive care, such as rest and application of cold or warm cloths (compresses) to the skin. ?Hospital care, if the condition is  severe. ?The infection usually starts to get better within 1-2 days of treatment. ?Follow these instructions at home: ?Medicines ?Take over-the-counter and prescription medicines only as told by your health care provider. ?If you were prescribed an antibiotic medicine, take it as told by your health care provider. Do not stop taking the antibiotic even if you start to feel better. ?General instructions ?Drink enough fluid to keep your urine pale yellow. ?Do not touch or rub the infected area. ?Raise (elevate) the infected area above the level of your heart while you are sitting or lying down. ?Apply warm or cold compresses to the affected area as told by your health care provider. ?Keep all follow-up visits as told by your health care provider. This is important. These visits let your health care provider make sure a more serious infection is not developing. ?Contact a health care provider if: ?You have a fever. ?Your symptoms do not begin to improve within 1-2 days of starting treatment. ?Your bone or joint underneath the infected area becomes painful after the skin has healed. ?Your infection returns in the same area or another area. ?You notice a swollen bump in the infected area. ?You develop new symptoms. ?You have a general ill feeling (malaise) with muscle aches and pains. ?Get help right away if: ?Your symptoms get worse. ?You feel very sleepy. ?You develop vomiting or diarrhea that persists. ?You notice red streaks coming from the infected area. ?Your red area gets larger or turns dark in color. ?These symptoms may represent a serious problem that is an emergency. Do not wait to see  if the symptoms will go away. Get medical help right away. Call your local emergency services (911 in the [place] Do not drive yourself to the hospital. ?Summary ?Cellulitis is a skin infection. This condition occurs most often in the arms and lower legs. ?Treatment for this condition may include medicines, such as antibiotic  medicines or antihistamines. ?Take over-the-counter and prescription medicines only as told by your health care provider. If you were prescribed an antibiotic medicine, do not stop taking the antibiotic even if you start to feel better. ?Contact a health care provider if your symptoms do not begin to improve within 1-2 days of starting treatment or your symptoms get worse. ?Keep all follow-up visits as told by your health care provider. This is important. These visits let your health care provider make sure that a more serious infection is not developing. ?This information is not intended to replace advice given to you by your health care provider. Make sure you discuss any questions you have with your health care provider. ?Document Revised: 10/16/2019 Document Reviewed: 02/24/2018 ?Elsevier Patient Education ? [place]. ? ?

## 2022-01-09 ENCOUNTER — Ambulatory Visit: Payer: Medicare HMO | Admitting: Family Medicine

## 2022-01-14 ENCOUNTER — Ambulatory Visit: Payer: Medicare HMO | Admitting: Internal Medicine

## 2022-01-14 DIAGNOSIS — H40053 Ocular hypertension, bilateral: Secondary | ICD-10-CM | POA: Diagnosis not present

## 2022-01-14 DIAGNOSIS — Z961 Presence of intraocular lens: Secondary | ICD-10-CM | POA: Diagnosis not present

## 2022-01-14 DIAGNOSIS — H02423 Myogenic ptosis of bilateral eyelids: Secondary | ICD-10-CM | POA: Diagnosis not present

## 2022-01-14 DIAGNOSIS — H04123 Dry eye syndrome of bilateral lacrimal glands: Secondary | ICD-10-CM | POA: Diagnosis not present

## 2022-01-14 DIAGNOSIS — R6889 Other general symptoms and signs: Secondary | ICD-10-CM | POA: Diagnosis not present

## 2022-01-21 ENCOUNTER — Encounter: Payer: Self-pay | Admitting: Internal Medicine

## 2022-01-21 ENCOUNTER — Ambulatory Visit (INDEPENDENT_AMBULATORY_CARE_PROVIDER_SITE_OTHER): Payer: Medicare HMO | Admitting: Internal Medicine

## 2022-01-21 VITALS — BP 128/80 | HR 69 | Resp 18 | Ht 63.0 in | Wt 231.6 lb

## 2022-01-21 DIAGNOSIS — E559 Vitamin D deficiency, unspecified: Secondary | ICD-10-CM

## 2022-01-21 DIAGNOSIS — J309 Allergic rhinitis, unspecified: Secondary | ICD-10-CM | POA: Diagnosis not present

## 2022-01-21 DIAGNOSIS — H6120 Impacted cerumen, unspecified ear: Secondary | ICD-10-CM | POA: Diagnosis not present

## 2022-01-21 DIAGNOSIS — L03116 Cellulitis of left lower limb: Secondary | ICD-10-CM | POA: Diagnosis not present

## 2022-01-21 MED ORDER — AZELASTINE HCL 0.05 % OP SOLN: 1.0000 [drp] | Freq: Two times a day (BID) | OPHTHALMIC | 12 refills | Status: DC

## 2022-01-21 MED ORDER — DOXYCYCLINE HYCLATE 100 MG PO TABS: 100.0000 mg | ORAL_TABLET | Freq: Two times a day (BID) | ORAL | 0 refills | Status: DC

## 2022-01-21 MED ORDER — METHYLPREDNISOLONE ACETATE 80 MG/ML IJ SUSP
80.0000 mg | Freq: Once | INTRAMUSCULAR | Status: AC
Start: 2022-01-21 — End: 2022-01-21
  Administered 2022-01-21: 80 mg via INTRAMUSCULAR

## 2022-01-21 MED ORDER — TRIAMCINOLONE ACETONIDE 55 MCG/ACT NA AERO: 2.0000 | INHALATION_SPRAY | Freq: Every day | NASAL | 12 refills | Status: DC

## 2022-01-21 NOTE — Patient Instructions (Addendum)
You had the steroid shot today for all the allergies ? ?Please take all new medication as prescribed  - the antibiotic for leg infection for 2 wks this time ? ?Please take all new medication as prescribed - the nasacort for the sinus allergies, and Optivar for the eye allergies ? ?You will be contacted regarding the referral for: ENT for the ear wax that wont come out ? ?Please continue all other medications as before, including the zyrtec ? ?Please have the pharmacy call with any other refills you may need. ? ?Please continue your efforts at being more active, low cholesterol diet, and weight control. ? ?Please keep your appointments with your specialists as you may have planned ? ?Please make an Appointment to return next week Jan 28, 2022 as you already have scheduled ?

## 2022-01-21 NOTE — Progress Notes (Signed)
Patient ID: [name], female   DOB: [birth year], 76 y.o.   MRN: [id number] ? ? ? ?    Chief Complaint: follow up left leg redness, left ear wax impaction, and allergies ? ?     HPI:  [name] is a 76 y.o. female here with known left ear wax impaction, prefers for ENT referral for removal as hearing somewhat reduced as well.  No HA tinnitus or vertigo.  Does have several wks ongoing nasal allergy symptoms with clearish congestion, itch and sneezing, without fever, pain, ST, cough, swelling or wheezing.  Pt denies chest pain, increased sob or doe, wheezing, orthopnea, PND, increased LE swelling, palpitations, dizziness or syncope.  Pt denies polydipsia, polyuria, or new focal neuro s/s. Does also present several days after finished 7 days doxy course for left leg pain redness and swelling but now worsening again.  No high fever, chills.  Not taking Vit D ?      ?Wt Readings from Last 3 Encounters:  ?01/21/22 231 lb 9.6 oz (105.1 kg)  ?01/02/22 230 lb 3.2 oz (104.4 kg)  ?11/03/21 230 lb (104.3 kg)  ? ?BP Readings from Last 3 Encounters:  ?01/21/22 128/80  ?01/02/22 118/76  ?11/03/21 108/72  ? ?      ?Past Medical History:  ?Diagnosis Date  ? Allergic rhinitis, cause unspecified   ? Allergy   ? SEASONAL  ? Anemia   ? Cataract   ? Bilateral  ? CHF (congestive heart failure) ([place])   ? Chronic headaches   ? Constipation   ? Diverticulosis   ? DJD (degenerative joint disease), lumbar   ? GERD (gastroesophageal reflux disease)   ? Hemorrhoids   ? Hiatal hernia   ? HTN (hypertension)   ? Hyperlipidemia   ? Obesity   ? Osteoarthritis   ? Tubular adenoma of colon   ? ?Past Surgical History:  ?Procedure Laterality Date  ? ABLATION    ? uterus  ? COLONOSCOPY  05/15/2021  ? Pyrtle  ? TOTAL KNEE ARTHROPLASTY Right 04/14/2019  ? Procedure: TOTAL KNEE ARTHROPLASTY;  Surgeon: Dorna Leitz, MD;  Location: [facility];  Service: Orthopedics;  Laterality: Right;  ? TUBAL LIGATION    ? ? reports that she has never smoked.  She has never been exposed to tobacco smoke. She has never used smokeless tobacco. She reports that she does not drink alcohol and does not use drugs. ?family history includes Breast cancer in her sister; Diabetes in her brother; Heart disease in her mother; Hypertension in her mother. ?Allergies  ?Allergen Reactions  ? Oxycodone   ? Soybean-Containing Drug Products Other (See Comments)  ?  Allergy testing positive for soy beans but pt uses soy sauce  ? Sulfonamide Derivatives Itching and Rash  ? ?Current Outpatient Medications on File Prior to Visit  ?Medication Sig Dispense Refill  ? aspirin-acetaminophen-caffeine (EXCEDRIN MIGRAINE) 250-250-65 MG tablet Take 1 tablet by mouth every 6 (six) hours as needed for headache.    ? atorvastatin (LIPITOR) 40 MG tablet Take 1 tablet (40 mg total) by mouth daily. 90 tablet 3  ? azelastine (ASTELIN) 0.1 % nasal spray Place 1 spray into both nostrils 2 (two) times daily. Use in each nostril as directed 90 mL 4  ? betamethasone, augmented, (DIPROLENE) 0.05 % lotion Apply topically daily as needed.    ? cetirizine (ZYRTEC) 10 MG tablet Take 1 tablet (10 mg total) by mouth daily. As needed (Patient taking differently: Take 10 mg by mouth daily  as needed for allergies. As needed) 90 tablet 3  ? Cholecalciferol 50 MCG (2000 UT) TABS 1 tab by mouth once daily 30 tablet 99  ? diltiazem (DILACOR XR) 240 MG 24 hr capsule TAKE 1 CAPSULE EVERY DAY 90 capsule 2  ? DULoxetine (CYMBALTA) 20 MG capsule TAKE 2 CAPSULES(40 MG) BY MOUTH DAILY 180 capsule 3  ? ferrous sulfate 325 (65 FE) MG tablet Take 1 tablet (325 mg total) by mouth 2 (two) times daily with a meal.  3  ? furosemide (LASIX) 80 MG tablet TAKE 1 TABLET TWICE DAILY 180 tablet 1  ? gabapentin (NEURONTIN) 100 MG capsule TAKE 2 CAPSULES BY MOUTH AT BEDTIME 180 capsule 1  ? HYDROcodone-acetaminophen (NORCO/VICODIN) 5-325 MG tablet Take 0.5 tablets by mouth daily as needed.    ? hydrOXYzine (ATARAX) 10 MG tablet TAKE 1 TABLET(10 MG)  BY MOUTH EVERY 8 HOURS AS NEEDED FOR ITCHING 60 tablet 2  ? losartan (COZAAR) 100 MG tablet TAKE 1 TABLET EVERY DAY 90 tablet 2  ? metoprolol tartrate (LOPRESSOR) 50 MG tablet Take 1 tablet (50 mg total) by mouth 2 (two) times daily. 180 tablet 3  ? ondansetron (ZOFRAN) 4 MG tablet Take 1 tablet (4 mg total) by mouth every 8 (eight) hours as needed for nausea or vomiting. 40 tablet 1  ? pantoprazole (PROTONIX) 40 MG tablet TAKE 1 TABLET(40 MG) BY MOUTH DAILY 90 tablet 0  ? Polyethyl Glycol-Propyl Glycol (SYSTANE OP) Place 1 drop into both eyes 3 (three) times daily as needed (dry eyes).    ? Polyethylene Glycol 3350 (MIRALAX PO) Take 17 g by mouth daily as needed (constipation).     ? potassium chloride (KLOR-CON M) 10 MEQ tablet TAKE 1 TABLET EVERY DAY 90 tablet 2  ? tiZANidine (ZANAFLEX) 2 MG tablet Take 1 tablet (2 mg total) by mouth every 6 (six) hours as needed for muscle spasms. 60 tablet 3  ? triamcinolone cream (KENALOG) 0.1 % Apply 1 application topically 2 (two) times daily. 453 g 0  ? vitamin B-12 (CYANOCOBALAMIN) 1000 MCG tablet Take 1,000 mcg by mouth daily.    ? ?No current facility-administered medications on file prior to visit.  ? ?     ROS:  All others reviewed and negative. ? ?Objective  ? ?     PE:  BP 128/80   Pulse 69   Resp 18   Ht '5\' 3"'$  (1.6 m)   Wt 231 lb 9.6 oz (105.1 kg)   SpO2 97%   BMI 41.03 kg/m?  ? ?              Constitutional: Pt appears in NAD ?              HENT: Head: NCAT.  ?              Right Ear: External ear normal.  Left cerumen impaction noted ?              Left Ear: External ear normal. Right tm's with mild erythema.  Max sinus areas non tender.  Pharynx with mild erythema, no exudate ?              Eyes: . Pupils are equal, round, and reactive to light. Conjunctivae and EOM are normal ?              Nose: without d/c or deformity ?              Neck: Neck supple. [name]  normal ROM ?              Cardiovascular: Normal rate and regular rhythm.   ?               Pulmonary/Chest: Effort normal and breath sounds without rales or wheezing.  ?              Abd:  Soft, NT, ND, + BS, no organomegaly ?              Neurological: Pt is alert. At baseline orientation, motor grossly intact ?              Skin: Skin is warm. No rashes, no other new lesions, LE edema - LLE 1+ diffuse with left lateral mid leg with 6 cm area heat, tender erythema on chronic diffuse  ?              Psychiatric: Pt behavior is normal without agitation  ? ?Micro: none ? ?Cardiac tracings I have personally interpreted today:  none ? ?Pertinent Radiological findings (summarize): none  ? ?Lab Results  ?Component Value Date  ? WBC 10.7 (H) 01/14/2021  ? HGB 12.7 01/14/2021  ? HCT 38.6 01/14/2021  ? PLT 349.0 01/14/2021  ? GLUCOSE 91 07/14/2021  ? CHOL 198 07/14/2021  ? TRIG 114.0 07/14/2021  ? HDL 68.30 07/14/2021  ? LDLDIRECT 93.0 01/14/2021  ? [place] 107 (H) 07/14/2021  ? ALT 19 07/14/2021  ? AST 15 07/14/2021  ? NA 140 07/14/2021  ? K 3.9 07/14/2021  ? CL 100 07/14/2021  ? CREATININE 1.34 (H) 07/14/2021  ? BUN 30 (H) 07/14/2021  ? CO2 30 07/14/2021  ? TSH 0.57 01/14/2021  ? INR 1.2 08/13/2020  ? HGBA1C 6.6 (H) 07/14/2021  ? ?Assessment/Plan:  ?Keryn Nessler is a 76 y.o. Black or African American [2] female with  has a past medical history of Allergic rhinitis, cause unspecified, Allergy, Anemia, Cataract, CHF (congestive heart failure) ([place]), Chronic headaches, Constipation, Diverticulosis, DJD (degenerative joint disease), lumbar, GERD (gastroesophageal reflux disease), Hemorrhoids, Hiatal hernia, HTN (hypertension), Hyperlipidemia, Obesity, Osteoarthritis, and Tubular adenoma of colon. ? ?Impacted cerumen ?Noted, for ENT referral per pt reqeust ? ?Allergic rhinitis ?With seasonal flare, for depomedrol im 80, also nasacort and zyrtec, optivar asd,  to f/u any worsening symptoms or concerns ? ?Cellulitis of left leg ?Mild to mod, for antibx course,  to f/u any worsening symptoms or  concerns ? ?Vitamin D deficiency ?Last vitamin D ?Lab Results  ?Component Value Date  ? VD25OH 33.18 07/14/2021  ? ?Low, reminded to start oral replacement ? ? ?Followup: Return in about 1 week (around 01/28/2022). ? ?[name], M

## 2022-01-24 ENCOUNTER — Encounter: Payer: Self-pay | Admitting: Internal Medicine

## 2022-01-24 DIAGNOSIS — H6120 Impacted cerumen, unspecified ear: Secondary | ICD-10-CM | POA: Insufficient documentation

## 2022-01-24 DIAGNOSIS — J309 Allergic rhinitis, unspecified: Secondary | ICD-10-CM | POA: Insufficient documentation

## 2022-01-24 NOTE — Assessment & Plan Note (Signed)
Mild to mod, for antibx course,  to f/u any worsening symptoms or concerns 

## 2022-01-24 NOTE — Assessment & Plan Note (Signed)
Last vitamin D ?Lab Results  ?Component Value Date  ? VD25OH 33.18 07/14/2021  ? ?Low, reminded to start oral replacement ? ? ?

## 2022-01-24 NOTE — Assessment & Plan Note (Signed)
Noted, for ENT referral per pt reqeust ?

## 2022-01-24 NOTE — Assessment & Plan Note (Signed)
With seasonal flare, for depomedrol im 80, also nasacort and zyrtec, optivar asd,  to f/u any worsening symptoms or concerns ?

## 2022-01-28 ENCOUNTER — Ambulatory Visit: Payer: Medicare HMO | Admitting: Internal Medicine

## 2022-02-09 DIAGNOSIS — M25562 Pain in left knee: Secondary | ICD-10-CM | POA: Diagnosis not present

## 2022-02-09 DIAGNOSIS — Z6841 Body Mass Index (BMI) 40.0 and over, adult: Secondary | ICD-10-CM | POA: Diagnosis not present

## 2022-02-09 DIAGNOSIS — M1712 Unilateral primary osteoarthritis, left knee: Secondary | ICD-10-CM | POA: Diagnosis not present

## 2022-02-17 DIAGNOSIS — M48062 Spinal stenosis, lumbar region with neurogenic claudication: Secondary | ICD-10-CM | POA: Diagnosis not present

## 2022-02-23 ENCOUNTER — Other Ambulatory Visit: Payer: Self-pay | Admitting: Internal Medicine

## 2022-02-24 ENCOUNTER — Other Ambulatory Visit: Payer: Self-pay | Admitting: Internal Medicine

## 2022-02-24 DIAGNOSIS — Z1231 Encounter for screening mammogram for malignant neoplasm of breast: Secondary | ICD-10-CM

## 2022-03-11 DIAGNOSIS — L218 Other seborrheic dermatitis: Secondary | ICD-10-CM | POA: Diagnosis not present

## 2022-03-18 IMAGING — CT CT TIBIA FIBULA *R* W/ CM
3 of 7 series · 9 of 33 positions shown, 10 images · IV contrast (agent unspecified)
Comparison: None.

CONTRAST:  100mL OMNIPAQUE IOHEXOL 350 MG/ML SOLN

CLINICAL DATA: Cellulitis, epidermolysis of the right lower
extremity

EXAM:
CT OF THE LOWER RIGHT EXTREMITY WITH CONTRAST
CT OF THE RIGHT FOOT WITH CONTRAST
CT OF THE RIGHT TIBIA AND FIBULA WITH CONTRAST
TECHNIQUE: Multidetector CT imaging of the lower right extremity, right tibia
and fibula, and right foot was performed according to the standard
protocol following intravenous contrast administration.

[Series 4: lfov ext 3.0 (person_name)30(person_name) (person_ · axial · 0.70mm/px · z∈[+474,+960]mm · 3 of 325 slices shown, 4 images]
[im 82/325  soft-tissue]
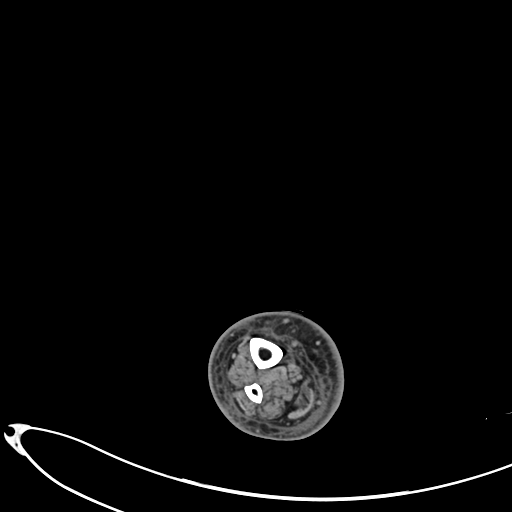
[im 82/325  bone]
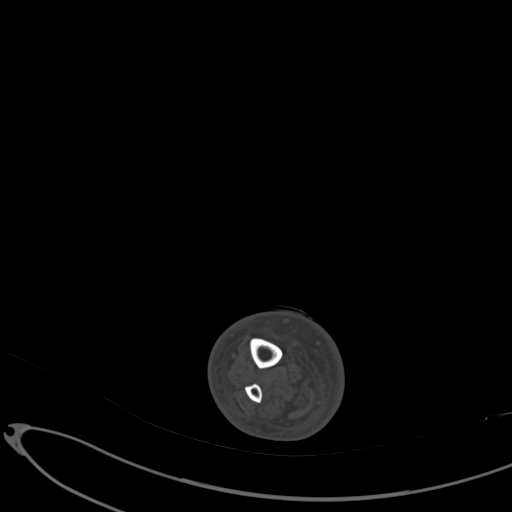
[im 163/325  bone]
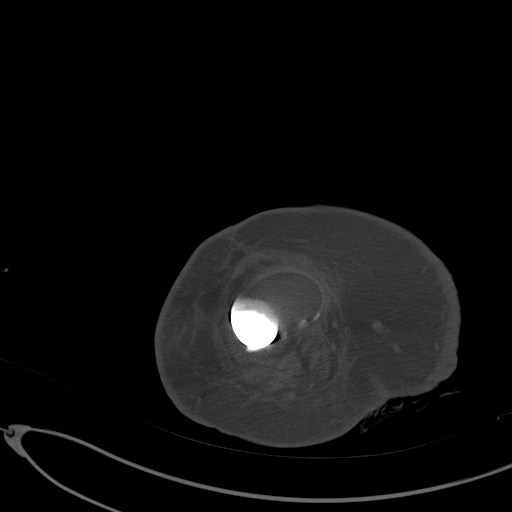
[im 244/325  bone]
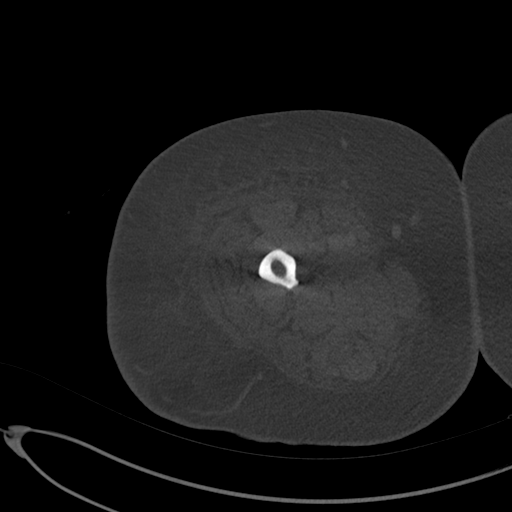

[Series 8: sagittalsoft tissue · sagittal · 0.77mm/px · 5 of 126 slices shown]
[im 42/126  bone]
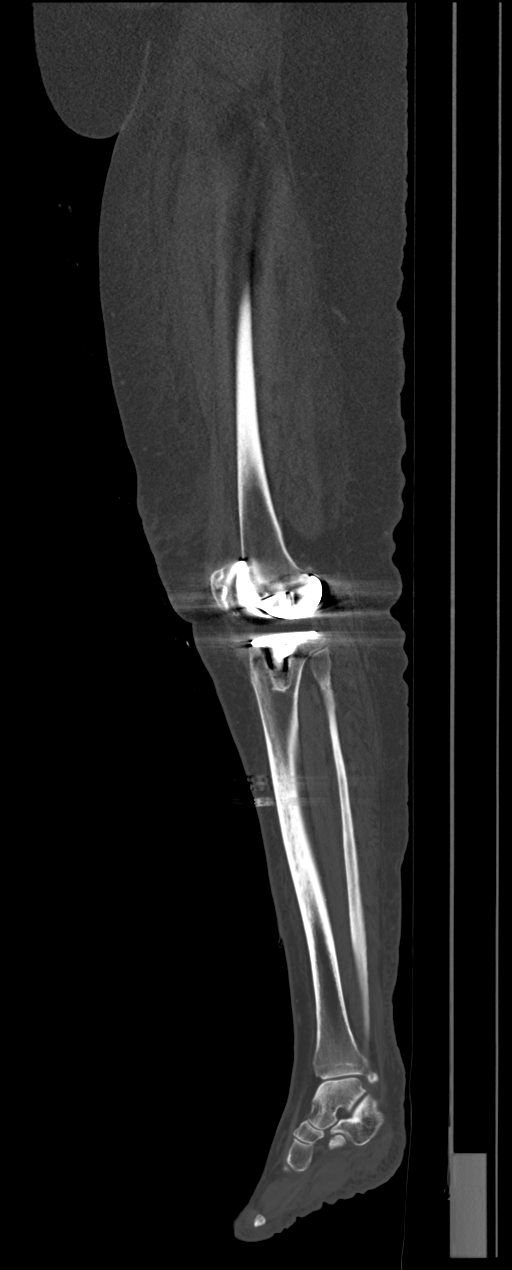
[im 53/126  bone]
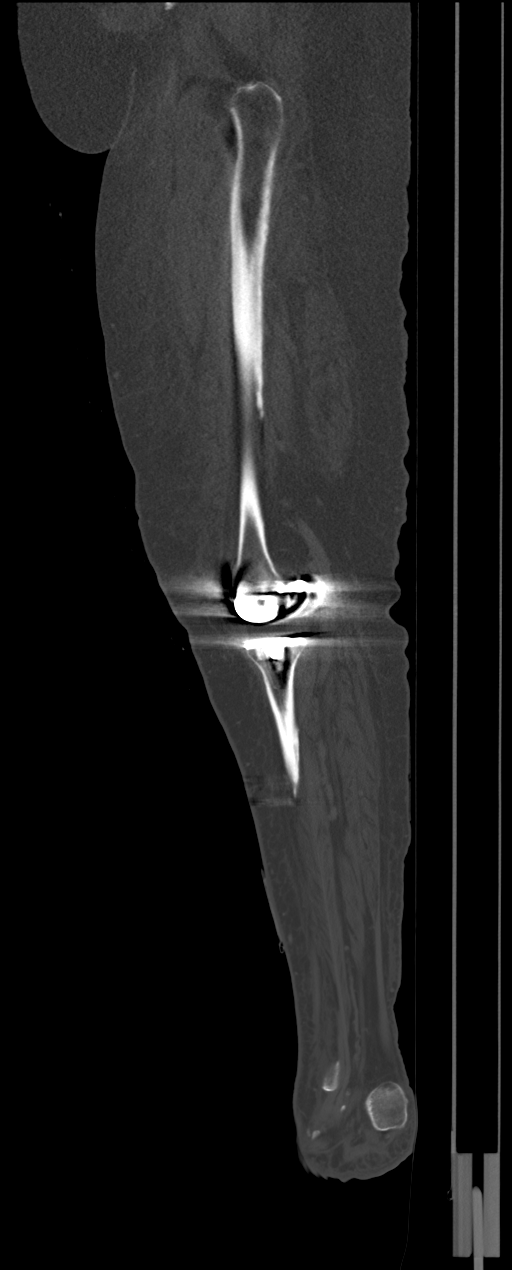
[im 63/126  bone]
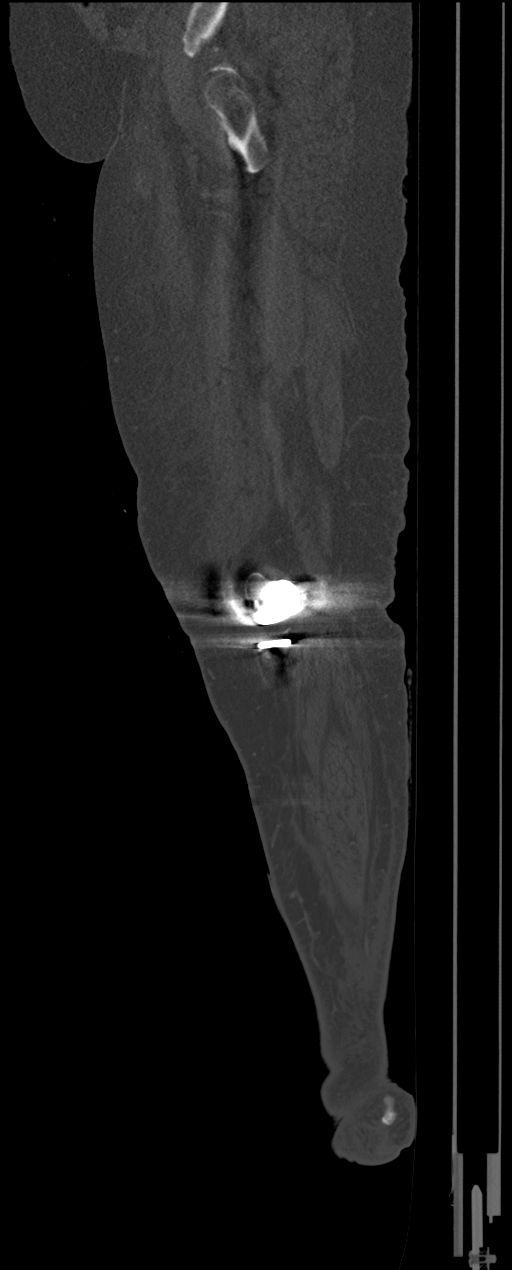
[im 73/126  bone]
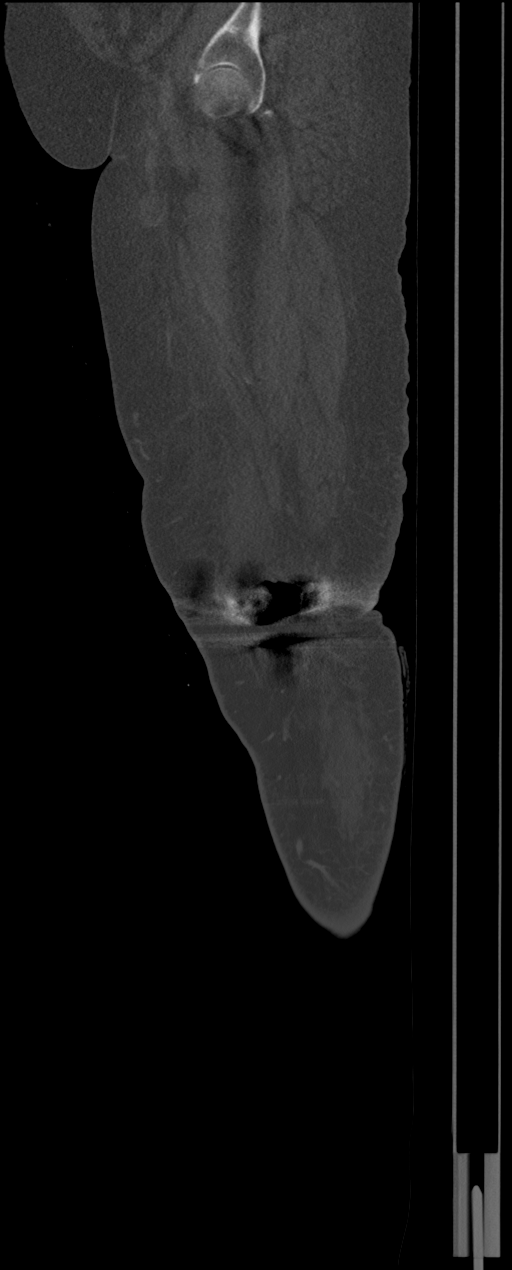
[im 84/126  bone]
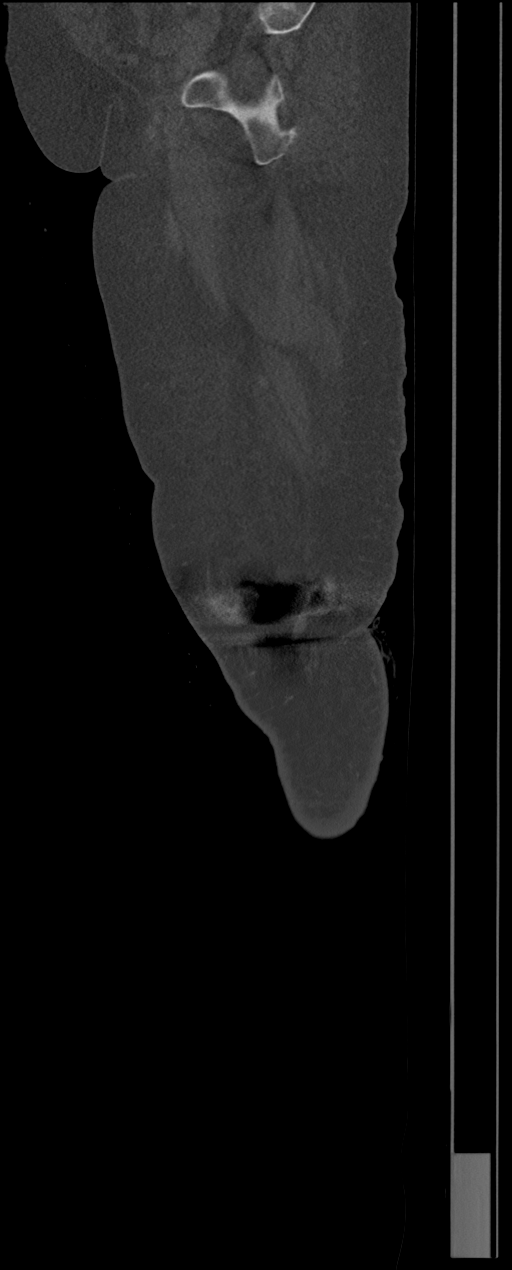

[Series 10: coronal bone · coronal · 0.57mm/px · 1 of 134 slices shown]
[im 67/134  bone]
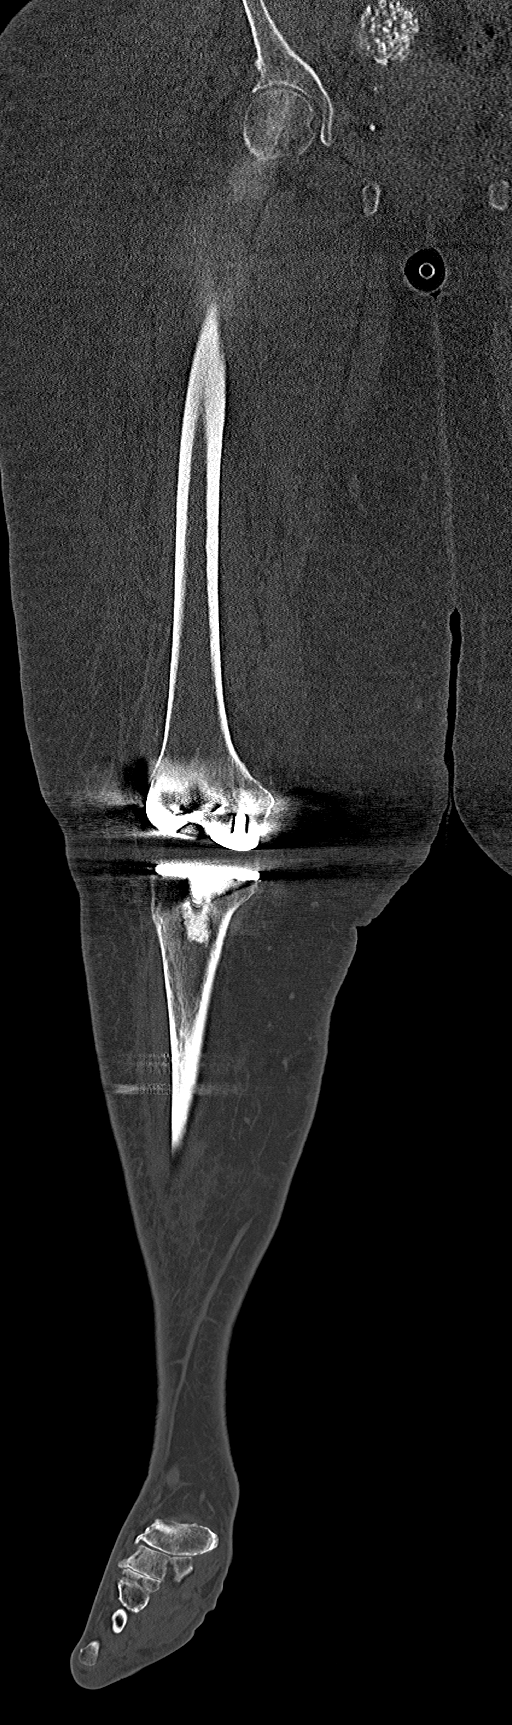

[9 of 33 positions shown; findings below may reference images not displayed]

FINDINGS: Bones/Joint/Cartilage

Mild right hip degenerative arthritis.

Status post right total knee arthroplasty with arthroplasty
components in expected alignment.

Moderate to severe degenerative arthritis of the midfoot
particularly involving the naviculocuneiform and second and third
tarsometatarsal joints with asymmetric joint space narrowing and
subchondral cyst formation.

No acute fracture or dislocation. No cortical erosion. No lytic or
blastic bone lesion.

Ligaments

Suboptimally assessed by CT.

Muscles and Tendons

Mild relative fatty infiltration of the vastus lateralis. Otherwise
no other significant findings.

Soft tissues

Moderate subcutaneous edema predominantly anteriorly and laterally
within the proximal thigh and circumferentially within the mid right
foreleg. No discrete drainable fluid collection identified.
Postsurgical changes noted within the anterior distal thigh and
proximal right foreleg related to total knee arthroplasty. Shotty
adenopathy noted within the right inguinal region. Incidental note
is made of multiple calcified involuted fibroids within the uterus.
IMPRESSION: Subcutaneous edema within the right thigh and foreleg. No discrete
drainable fluid collection identified. No asymmetric muscular
enlargement or infiltration suggest acute myositis. No cortical
erosion to suggest osteomyelitis.

## 2022-03-25 DIAGNOSIS — F1120 Opioid dependence, uncomplicated: Secondary | ICD-10-CM | POA: Diagnosis not present

## 2022-03-25 DIAGNOSIS — M48062 Spinal stenosis, lumbar region with neurogenic claudication: Secondary | ICD-10-CM | POA: Diagnosis not present

## 2022-03-30 ENCOUNTER — Other Ambulatory Visit: Payer: Self-pay | Admitting: Internal Medicine

## 2022-04-02 ENCOUNTER — Ambulatory Visit: Payer: Medicare HMO

## 2022-04-23 ENCOUNTER — Telehealth: Payer: Medicare HMO

## 2022-04-28 DIAGNOSIS — H52209 Unspecified astigmatism, unspecified eye: Secondary | ICD-10-CM | POA: Diagnosis not present

## 2022-04-28 DIAGNOSIS — H5213 Myopia, bilateral: Secondary | ICD-10-CM | POA: Diagnosis not present

## 2022-04-28 DIAGNOSIS — H524 Presbyopia: Secondary | ICD-10-CM | POA: Diagnosis not present

## 2022-04-30 ENCOUNTER — Ambulatory Visit (INDEPENDENT_AMBULATORY_CARE_PROVIDER_SITE_OTHER): Payer: Medicare HMO | Admitting: Nurse Practitioner

## 2022-04-30 VITALS — BP 124/68 | HR 69 | Temp 98.9°F | Ht 63.0 in | Wt 227.0 lb

## 2022-04-30 DIAGNOSIS — L299 Pruritus, unspecified: Secondary | ICD-10-CM | POA: Diagnosis not present

## 2022-04-30 DIAGNOSIS — N1832 Chronic kidney disease, stage 3b: Secondary | ICD-10-CM | POA: Diagnosis not present

## 2022-04-30 DIAGNOSIS — L03116 Cellulitis of left lower limb: Secondary | ICD-10-CM | POA: Diagnosis not present

## 2022-04-30 MED ORDER — HYDROXYZINE HCL 10 MG PO TABS: 10.0000 mg | ORAL_TABLET | Freq: Every evening | ORAL | 2 refills | Status: DC | PRN

## 2022-04-30 MED ORDER — CEPHALEXIN 500 MG PO CAPS: 500.0000 mg | ORAL_CAPSULE | Freq: Two times a day (BID) | ORAL | 0 refills | Status: DC

## 2022-04-30 MED ORDER — CEFTRIAXONE SODIUM 500 MG IJ SOLR
500.0000 mg | Freq: Once | INTRAMUSCULAR | Status: AC
  Administered 2022-04-30: 500 mg via INTRAMUSCULAR

## 2022-04-30 NOTE — Progress Notes (Signed)
   Established Patient Office Visit  Subjective   Patient ID: [name], female    DOB: 21-Jan-1946  Age: 76 y.o. MRN: [id number]  Chief Complaint  Patient presents with   Redness and irritation    Of left leg, no pain with a little swelling and warm to the tough   Medication Refill    Itching pills    Patient has today for the above.  She reports that 1 week ago her dog scratched her left leg and since then she has been experiencing progressively worsening left lower leg edema, redness, warmth, pain, and some purulent discharge from the wound.  She reports having been cleaning the wound daily as well as using petroleum impregnated gauze.  She is here for evaluation.  She has a history of generalized pruritus which she takes hydroxyzine as needed for.  She would like a refill on this.  According to last metabolic panel her creatinine clearance is approximately 42.    Review of Systems  Constitutional:  Negative for fever.  Skin:        (+) wound      Objective:     BP 124/68 (BP Location: Right Arm, Patient Position: Sitting, Cuff Size: Large)   Pulse 69   Temp 98.9 F (37.2 C)   Ht $R'5\' 3"'Sh$  (1.6 m)   Wt 227 lb (103 kg)   SpO2 96%   BMI 40.21 kg/m    Physical Exam Vitals reviewed.  Constitutional:      General: She is not in acute distress.    Appearance: Normal appearance.  HENT:     Head: Normocephalic and atraumatic.  Neck:     Vascular: No carotid bruit.  Cardiovascular:     Rate and Rhythm: Normal rate and regular rhythm.     Pulses: Normal pulses.     Heart sounds: Normal heart sounds.  Pulmonary:     Effort: Pulmonary effort is normal.     Breath sounds: Normal breath sounds.  Skin:    General: Skin is warm and dry.          Comments: Under red line is where cellulitis is located. It is erythematous, warm to touch, and edema noted.  Neurological:     General: No focal deficit present.     Mental Status: She is alert and oriented to  person, place, and time.  Psychiatric:        Mood and Affect: Mood normal.        Behavior: Behavior normal.        Judgment: Judgment normal.      No results found for any visits on 04/30/22.    The 10-year ASCVD risk score (Arnett DK, et al., 2019) is: 15.7%    Assessment & Plan:   Problem List Items Addressed This Visit       Genitourinary   CKD (chronic kidney disease) stage 3, GFR 30-59 ml/min (HCC) - Primary   Relevant Medications   hydrOXYzine (ATARAX) 10 MG tablet   Other Relevant Orders   Ambulatory referral to Nephrology   CBC   Comp Met (CMET)     Other   Cellulitis of left leg   Relevant Medications   hydrOXYzine (ATARAX) 10 MG tablet   cephALEXin (KEFLEX) 500 MG capsule   Other Relevant Orders   CBC   Comp Met (CMET)    Return in about 1 week (around 05/07/2022) for Follow-up with Judson Roch.    Ailene Ards, NP

## 2022-04-30 NOTE — Assessment & Plan Note (Signed)
Hydroxyzine refilled today per patient request.

## 2022-04-30 NOTE — Assessment & Plan Note (Signed)
Acute, secondary to dog scratch.  Consulted with Dr. Sharlet Salina regarding treatment plan.  Will administer ceftriaxone 500 mg IM x1.  We will start patient on Keflex 500 mg twice daily x1 week starting tomorrow.  Patient will follow-up in 1 week for close monitoring.  She was warned to look for signs of worsening swelling or redness, if these were to occur she was told to proceed to the emergency department.  She reports understanding.

## 2022-04-30 NOTE — Assessment & Plan Note (Signed)
Discussed recommendation for referral to nephrology to assist with managing CKD.  Patient would like this referral, order placed today.

## 2022-05-07 ENCOUNTER — Telehealth: Payer: Self-pay | Admitting: Nurse Practitioner

## 2022-05-07 ENCOUNTER — Ambulatory Visit (INDEPENDENT_AMBULATORY_CARE_PROVIDER_SITE_OTHER): Payer: Medicare HMO | Admitting: Nurse Practitioner

## 2022-05-07 VITALS — BP 130/84 | HR 80 | Temp 98.7°F | Ht 63.0 in | Wt 230.0 lb

## 2022-05-07 DIAGNOSIS — L03116 Cellulitis of left lower limb: Secondary | ICD-10-CM | POA: Diagnosis not present

## 2022-05-07 MED ORDER — NUZYRA 150 MG PO TABS: 2.0000 | ORAL_TABLET | Freq: Every day | ORAL | 0 refills | Status: DC

## 2022-05-07 NOTE — Progress Notes (Signed)
Established Patient Office Visit  Subjective   Patient ID: [name], female    DOB: Aug 27, 1946  Age: 76 y.o. MRN: [id number]  Chief Complaint  Patient presents with   1wk follow up    Reaction to new Rx, stopped taking due to itching    HPI Cellulitis - Here for follow-up for cellulitis.  She was seen approximately 1 week ago at which point she was given one-time dose of ceftriaxone and started on Keflex. Patient presents today with increasing swelling, itching, redness and is now on bilateral lower extremities instead of just the left leg that she presented with last week after her dog scratched her. Patient reports that she wasn't able to take all the medication (Keflex) and only took half of the medication.  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     Review of Systems  Constitutional:  Negative for fever.  Respiratory:  Negative for shortness of breath.   Cardiovascular:  Positive for leg swelling. Negative for chest pain.  Gastrointestinal:  Negative for constipation, diarrhea, nausea and vomiting.  Musculoskeletal:  Negative for joint pain and myalgias.  Skin:  Positive for itching and rash.  Neurological:  Negative for dizziness.      Objective:     BP 130/84 (BP Location: Left Arm, Patient Position: Sitting, Cuff Size: Large)   Pulse 80   Temp 98.7 F (37.1 C) (Oral)   Ht '5\' 3"'$  (1.6 m)   Wt 230 lb (104.3 kg)   SpO2 95%   BMI 40.74 kg/m  BP Readings from Last 3 Encounters:  05/07/22 130/84  04/30/22 124/68  01/21/22 128/80      Physical Exam Constitutional:      Appearance: Normal appearance.  HENT:     Head: Normocephalic.  Cardiovascular:     Rate and Rhythm: Normal rate and regular rhythm.     Heart sounds: Normal heart sounds.  Skin:    General: Skin is warm.          Comments: Red lines- under red lines is area of redness and  heat Blue line-location of wound. It is still open but not draining or bleeding  Neurological:     Mental Status: She is alert and oriented to person, place, and time.  Psychiatric:        Mood and Affect: Mood normal.        Behavior: Behavior normal.      No results found for any visits on 05/07/22.  Last CBC Lab Results  Component Value Date   WBC 10.7 (H) 01/14/2021   HGB 12.7 01/14/2021   HCT 38.6 01/14/2021   MCV 85.9 01/14/2021   MCH 25.2 (L) 09/20/2020   RDW 14.6 01/14/2021   PLT 349.0 01/14/2021      The 10-year ASCVD risk score (Arnett DK, et al., 2019) is: 16.8%    Assessment & Plan:   Problem List Items Addressed This Visit       Other   Cellulitis of left leg - Primary    Acute, secondary to dog scratch. Due to patient condition getting worse, consulted with Dr. Ronnald Ramp. We assessed that patient and recommended Nuzyra antibiotic. Culture obtained from left leg where the dog scratched. Patient was given samples of the antibiotic for 8 days. She was advised to take 3 tablets on an empty stomach today and tomorrow and 2 tablets on day 3  through day 8. Also advised patient to go to the ED if symptoms continue to worsen. FU in 1 week      Relevant Medications   Omadacycline Tosylate (NUZYRA) 150 MG TABS   Other Relevant Orders   WOUND CULTURE    Return in about 1 week (around 05/14/2022) for F/u 1 week with Judson Roch.    Glade Nurse, RN

## 2022-05-07 NOTE — Telephone Encounter (Signed)
Please call patient and notify her that I also decided to refer her to wound clinic to help manage wound to her left lower extremity as it does not appear to have healed any since last time I saw her.  They should be calling her within the next week or so to get her scheduled for evaluation.  Please let me know if she has any questions.  Thank you.

## 2022-05-07 NOTE — Assessment & Plan Note (Addendum)
Acute, secondary to dog scratch. Due to patient condition getting worse, consulted with back-up supervising physician Dr. Ronnald Ramp. He assessed patient as well and recommended Nuzyra antibiotic. VSS, no obvious signs of sepsis today. Wound culture obtained from left leg where the dog scratched. Patient was given samples of the antibiotic for 8 days. She was advised to take 3 tablets on an empty stomach today and tomorrow and 2 tablets on day 3 through day 8. Also advised patient to go to the ED if symptoms continue to worsen. Referral to wound care clinic for assistant with managing wound to left lower extremity.  FU in 1 week for close monitoring.

## 2022-05-08 NOTE — Telephone Encounter (Signed)
Spoke with pt and was able to get an update on her wound... Pt has stated the wound care center has contacted her and was able to sched her an apptmnt for 05/25/2022.

## 2022-05-10 LAB — WOUND CULTURE

## 2022-05-13 ENCOUNTER — Ambulatory Visit
Admission: RE | Admit: 2022-05-13 | Discharge: 2022-05-13 | Disposition: A | Discharge status: Home / Self Care | Payer: Medicare HMO | Source: Ambulatory Visit | Attending: Internal Medicine | Admitting: Internal Medicine

## 2022-05-13 ENCOUNTER — Other Ambulatory Visit: Payer: Self-pay | Admitting: Internal Medicine

## 2022-05-13 DIAGNOSIS — Z1231 Encounter for screening mammogram for malignant neoplasm of breast: Secondary | ICD-10-CM

## 2022-05-13 NOTE — Telephone Encounter (Signed)
Please refill as per office routine med refill policy (all routine meds to be refilled for 3 mo or monthly (per pt preference) up to one year from last visit, then month to month grace period for 3 mo, then further med refills will have to be denied) ? ?

## 2022-05-15 ENCOUNTER — Ambulatory Visit (INDEPENDENT_AMBULATORY_CARE_PROVIDER_SITE_OTHER): Payer: Medicare HMO | Admitting: Internal Medicine

## 2022-05-15 VITALS — BP 138/84 | HR 70 | Temp 97.6°F | Ht 63.0 in | Wt 231.4 lb

## 2022-05-15 DIAGNOSIS — L03116 Cellulitis of left lower limb: Secondary | ICD-10-CM | POA: Diagnosis not present

## 2022-05-15 DIAGNOSIS — R739 Hyperglycemia, unspecified: Secondary | ICD-10-CM | POA: Diagnosis not present

## 2022-05-15 DIAGNOSIS — E559 Vitamin D deficiency, unspecified: Secondary | ICD-10-CM | POA: Diagnosis not present

## 2022-05-15 DIAGNOSIS — E538 Deficiency of other specified B group vitamins: Secondary | ICD-10-CM | POA: Diagnosis not present

## 2022-05-15 DIAGNOSIS — Z0001 Encounter for general adult medical examination with abnormal findings: Secondary | ICD-10-CM | POA: Diagnosis not present

## 2022-05-15 DIAGNOSIS — E782 Mixed hyperlipidemia: Secondary | ICD-10-CM

## 2022-05-15 DIAGNOSIS — H6120 Impacted cerumen, unspecified ear: Secondary | ICD-10-CM

## 2022-05-15 LAB — HEPATIC FUNCTION PANEL
ALT: 15 U/L (ref 0–35)
AST: 15 U/L (ref 0–37)
Albumin: 4.1 g/dL (ref 3.5–5.2)
Alkaline Phosphatase: 115 U/L (ref 39–117)
Bilirubin, Direct: 0.1 mg/dL (ref 0.0–0.3)
Total Bilirubin: 0.4 mg/dL (ref 0.2–1.2)
Total Protein: 7.2 g/dL (ref 6.0–8.3)

## 2022-05-15 LAB — CBC WITH DIFFERENTIAL/PLATELET
Basophils Absolute: 0.1 10*3/uL (ref 0.0–0.1)
Basophils Relative: 1.4 % (ref 0.0–3.0)
Eosinophils Absolute: 0.6 10*3/uL (ref 0.0–0.7)
Eosinophils Relative: 5.4 % — ABNORMAL HIGH (ref 0.0–5.0)
HCT: 36.4 % (ref 36.0–46.0)
Hemoglobin: 12 g/dL (ref 12.0–15.0)
Lymphocytes Relative: 20.5 % (ref 12.0–46.0)
Lymphs Abs: 2.1 10*3/uL (ref 0.7–4.0)
MCHC: 32.9 g/dL (ref 30.0–36.0)
MCV: 89.3 fl (ref 78.0–100.0)
Monocytes Absolute: 0.9 10*3/uL (ref 0.1–1.0)
Monocytes Relative: 8.5 % (ref 3.0–12.0)
Neutro Abs: 6.7 10*3/uL (ref 1.4–7.7)
Neutrophils Relative %: 64.2 % (ref 43.0–77.0)
Platelets: 352 10*3/uL (ref 150.0–400.0)
RBC: 4.08 Mil/uL (ref 3.87–5.11)
RDW: 13.6 % (ref 11.5–15.5)
WBC: 10.4 10*3/uL (ref 4.0–10.5)

## 2022-05-15 LAB — LIPID PANEL
Cholesterol: 172 mg/dL (ref 0–200)
HDL: 56 mg/dL (ref >39.00)
LDL Cholesterol: 85 mg/dL (ref 0–99)
NonHDL: 116.47
Total CHOL/HDL Ratio: 3
Triglycerides: 155 mg/dL — ABNORMAL HIGH (ref 0.0–149.0)
VLDL: 31 mg/dL (ref 0.0–40.0)

## 2022-05-15 LAB — BASIC METABOLIC PANEL
BUN: 23 mg/dL (ref 6–23)
CO2: 32 mEq/L (ref 19–32)
Calcium: 9.5 mg/dL (ref 8.4–10.5)
Chloride: 102 mEq/L (ref 96–112)
Creatinine, Ser: 1.03 mg/dL (ref 0.40–1.20)
GFR: 53.28 mL/min — ABNORMAL LOW (ref >60.00)
Glucose, Bld: 104 mg/dL — ABNORMAL HIGH (ref 70–99)
Potassium: 3.7 mEq/L (ref 3.5–5.1)
Sodium: 142 mEq/L (ref 135–145)

## 2022-05-15 LAB — HEMOGLOBIN A1C: Hgb A1c MFr Bld: 6.6 % — ABNORMAL HIGH (ref 4.6–6.5)

## 2022-05-15 LAB — VITAMIN D 25 HYDROXY (VIT D DEFICIENCY, FRACTURES): VITD: 37.93 ng/mL (ref 30.00–100.00)

## 2022-05-15 LAB — TSH: TSH: 0.42 u[IU]/mL (ref 0.35–5.50)

## 2022-05-15 LAB — MICROALBUMIN / CREATININE URINE RATIO
Creatinine,U: 38.8 mg/dL
Microalb Creat Ratio: 1.8 mg/g (ref 0.0–30.0)
Microalb, Ur: <0.7 mg/dL (ref 0.0–1.9)

## 2022-05-15 LAB — URINALYSIS, ROUTINE W REFLEX MICROSCOPIC
Bilirubin Urine: NEGATIVE
Hgb urine dipstick: NEGATIVE
Ketones, ur: NEGATIVE
Leukocytes,Ua: NEGATIVE
Nitrite: NEGATIVE
Specific Gravity, Urine: 1.01 (ref 1.000–1.030)
Total Protein, Urine: NEGATIVE
Urine Glucose: NEGATIVE
Urobilinogen, UA: 0.2 (ref 0.0–1.0)
pH: 6 (ref 5.0–8.0)

## 2022-05-15 LAB — VITAMIN B12: Vitamin B-12: >1500 pg/mL — ABNORMAL HIGH (ref 211–911)

## 2022-05-15 MED ORDER — CLINDAMYCIN HCL 300 MG PO CAPS: 300.0000 mg | ORAL_CAPSULE | Freq: Three times a day (TID) | ORAL | 0 refills | Status: DC

## 2022-05-15 NOTE — Patient Instructions (Signed)
Please take all new medication as prescribed - the antibiotic  Please continue all other medications as before, and refills have been done if requested.  Please have the pharmacy call with any other refills you may need.  Please continue your efforts at being more active, low cholesterol diet, and weight control.  You are otherwise up to date with prevention measures today.  Please keep your appointments with your specialists as you may have planned - the wound clinic  Please go to the LAB at the blood drawing area for the tests to be done  You will be contacted by phone if any changes need to be made immediately.  Otherwise, you will receive a letter about your results with an explanation, but please check with MyChart first.  Please remember to sign up for MyChart if you have not done so, as this will be important to you in the future with finding out test results, communicating by private email, and scheduling acute appointments online when needed.  Please make an Appointment to return in 6 months, or sooner if needed

## 2022-05-15 NOTE — Progress Notes (Unsigned)
Patient ID: [name], female   DOB: August 20, 1946, 76 y.o.   MRN: [id number]         Chief Complaint:: wellness exam and Follow-up (1 week f/u appt )  With left leg wound and cellulitis       HPI:  [name] is a 76 y.o. female here for wellness exam; declines covid booster o/w up to date                        Also seen and tx 1 wk ago for left leg wound cellulitis with some initial improvement then worse again in the past 2-3 days.  No fever, chills but leg is still red, tender, swelling.  Pt denies chest pain, increased sob or doe, wheezing, orthopnea, PND, increased LE swelling, palpitations, dizziness or syncope.   Pt denies polydipsia, polyuria, or new focal neuro s/s.    Pt denies fever, wt loss, night sweats, loss of appetite, or other constitutional symptoms     Wt Readings from Last 3 Encounters:  05/15/22 231 lb 6 oz (105 kg)  05/07/22 230 lb (104.3 kg)  04/30/22 227 lb (103 kg)   BP Readings from Last 3 Encounters:  05/15/22 138/84  05/07/22 130/84  04/30/22 124/68   Immunization History  Administered Date(s) Administered   Fluad Quad(high Dose 65+) 07/12/2020, 07/14/2021   Influenza Split 09/30/2012   Influenza, High Dose Seasonal PF 08/01/2013, 08/11/2016, 07/26/2017, 06/24/2018, 07/11/2019   Influenza,inj,Quad PF,6+ Mos 07/26/2015   Moderna Sars-Covid-2 Vaccination 11/25/2019, 12/18/2019, 08/05/2020   Pneumococcal Conjugate-13 03/15/2014   Pneumococcal Polysaccharide-23 01/30/2013   Tdap 01/18/1999, 04/13/2018   There are no preventive care reminders to display for this patient.     Past Medical History:  Diagnosis Date   Allergic rhinitis, cause unspecified    Allergy    SEASONAL   Anemia    Cataract    Bilateral   CHF (congestive heart failure) (HCC)    Chronic headaches    Constipation    Diverticulosis    DJD (degenerative joint disease), lumbar    GERD (gastroesophageal reflux disease)    Hemorrhoids    Hiatal hernia    HTN  (hypertension)    Hyperlipidemia    Obesity    Osteoarthritis    Tubular adenoma of colon    Past Surgical History:  Procedure Laterality Date   ABLATION     uterus   COLONOSCOPY  05/15/2021   [place]   TOTAL KNEE ARTHROPLASTY Right 04/14/2019   Procedure: TOTAL KNEE ARTHROPLASTY;  Surgeon: Dorna Leitz, MD;  Location: [facility];  Service: Orthopedics;  Laterality: Right;   TUBAL LIGATION      reports that she has never smoked. She has never been exposed to tobacco smoke. She has never used smokeless tobacco. She reports that she does not drink alcohol and does not use drugs. family history includes Breast cancer in her sister; Diabetes in her brother; Heart disease in her mother; Hypertension in her mother. Allergies  Allergen Reactions   Doxycycline Itching    Rash, itching   Oxycodone    Soybean-Containing Drug Products Other (See Comments)    Allergy testing positive for soy beans but pt uses soy sauce   Sulfonamide Derivatives Itching and Rash   Current Outpatient Medications on File Prior to Visit  Medication Sig Dispense Refill   aspirin-acetaminophen-caffeine (EXCEDRIN MIGRAINE) 250-250-65 MG tablet Take 1 tablet by mouth every 6 (six) hours as needed for headache.  atorvastatin (LIPITOR) 40 MG tablet Take 1 tablet (40 mg total) by mouth daily. 90 tablet 3   azelastine (ASTELIN) 0.1 % nasal spray Place 1 spray into both nostrils 2 (two) times daily. Use in each nostril as directed 90 mL 4   azelastine (OPTIVAR) 0.05 % ophthalmic solution Place 1 drop into both eyes 2 (two) times daily. 6 mL 12   betamethasone, augmented, (DIPROLENE) 0.05 % lotion Apply topically daily as needed.     cetirizine (ZYRTEC) 10 MG tablet Take 1 tablet (10 mg total) by mouth daily. As needed (Patient taking differently: Take 10 mg by mouth daily as needed for allergies. As needed) 90 tablet 3   Cholecalciferol 50 MCG (2000 UT) TABS 1 tab by mouth once daily 30 tablet 99   diltiazem (DILACOR XR)  240 MG 24 hr capsule TAKE 1 CAPSULE EVERY DAY 90 capsule 2   DULoxetine (CYMBALTA) 20 MG capsule TAKE 2 CAPSULES EVERY DAY 180 capsule 3   ferrous sulfate 325 (65 FE) MG tablet Take 1 tablet (325 mg total) by mouth 2 (two) times daily with a meal.  3   furosemide (LASIX) 80 MG tablet TAKE 1 TABLET TWICE DAILY 180 tablet 1   gabapentin (NEURONTIN) 100 MG capsule TAKE 2 CAPSULES BY MOUTH AT BEDTIME 180 capsule 1   HYDROcodone-acetaminophen (NORCO/VICODIN) 5-325 MG tablet Take 0.5 tablets by mouth daily as needed.     hydrOXYzine (ATARAX) 10 MG tablet Take 1 tablet (10 mg total) by mouth at bedtime as needed for itching. 60 tablet 2   losartan (COZAAR) 100 MG tablet Take 1 tablet (100 mg total) by mouth daily. Annual appt due in Sept must see provider for future refills 90 tablet 0   metoprolol tartrate (LOPRESSOR) 50 MG tablet TAKE 1 TABLET TWICE DAILY 180 tablet 3   Omadacycline Tosylate (NUZYRA) 150 MG TABS Take 2 tablets by mouth daily. 12 tablet 0   ondansetron (ZOFRAN) 4 MG tablet Take 1 tablet (4 mg total) by mouth every 8 (eight) hours as needed for nausea or vomiting. 40 tablet 1   pantoprazole (PROTONIX) 40 MG tablet TAKE 1 TABLET(40 MG) BY MOUTH DAILY 90 tablet 0   Polyethyl Glycol-Propyl Glycol (SYSTANE OP) Place 1 drop into both eyes 3 (three) times daily as needed (dry eyes).     Polyethylene Glycol 3350 (MIRALAX PO) Take 17 g by mouth daily as needed (constipation).      potassium chloride (KLOR-CON M) 10 MEQ tablet TAKE 1 TABLET EVERY DAY 90 tablet 2   tiZANidine (ZANAFLEX) 2 MG tablet Take 1 tablet (2 mg total) by mouth every 6 (six) hours as needed for muscle spasms. 60 tablet 3   triamcinolone (NASACORT) 55 MCG/ACT AERO nasal inhaler Place 2 sprays into the nose daily. 1 each 12   triamcinolone cream (KENALOG) 0.1 % Apply 1 application topically 2 (two) times daily. 453 g 0   vitamin B-12 (CYANOCOBALAMIN) 1000 MCG tablet Take 1,000 mcg by mouth daily.     No current  facility-administered medications on file prior to visit.        ROS:  All others reviewed and negative.  Objective        PE:  BP 138/84   Pulse 70   Temp 97.6 F (36.4 C) (Oral)   Ht '5\' 3"'$  (1.6 m)   Wt 231 lb 6 oz (105 kg)   SpO2 92%   BMI 40.99 kg/m  Constitutional: Pt appears in NAD               HENT: Head: NCAT.                Right Ear: External ear normal.                 Left Ear: External ear normal.                Eyes: . Pupils are equal, round, and reactive to light. Conjunctivae and EOM are normal               Nose: without d/c or deformity               Neck: Neck supple. Gross normal ROM               Cardiovascular: Normal rate and regular rhythm.                 Pulmonary/Chest: Effort normal and breath sounds without rales or wheezing.                Abd:  Soft, NT, ND, + BS, no organomegaly               Neurological: Pt is alert. At baseline orientation, motor grossly intact               Skin: Skin is warm with left lateral leg wound without drainage but with 1-2+ surrounding erythema tender               Psychiatric: Pt behavior is normal without agitation   Micro: none  Cardiac tracings I have personally interpreted today:  none  Pertinent Radiological findings (summarize): none   Lab Results  Component Value Date   WBC 10.4 05/15/2022   HGB 12.0 05/15/2022   HCT 36.4 05/15/2022   PLT 352.0 05/15/2022   GLUCOSE 104 (H) 05/15/2022   CHOL 172 05/15/2022   TRIG 155.0 (H) 05/15/2022   HDL 56.00 05/15/2022   LDLDIRECT 93.0 01/14/2021   LDLCALC 85 05/15/2022   ALT 15 05/15/2022   AST 15 05/15/2022   NA 142 05/15/2022   K 3.7 05/15/2022   CL 102 05/15/2022   CREATININE 1.03 05/15/2022   BUN 23 05/15/2022   CO2 32 05/15/2022   TSH 0.42 05/15/2022   INR 1.2 08/13/2020   HGBA1C 6.6 (H) 05/15/2022   MICROALBUR <0.7 05/15/2022   Assessment/Plan:  [name] is a 76 y.o. Black or African American [2] female with  has a  past medical history of Allergic rhinitis, cause unspecified, Allergy, Anemia, Cataract, CHF (congestive heart failure) ([place]), Chronic headaches, Constipation, Diverticulosis, DJD (degenerative joint disease), lumbar, GERD (gastroesophageal reflux disease), Hemorrhoids, Hiatal hernia, HTN (hypertension), Hyperlipidemia, Obesity, Osteoarthritis, and Tubular adenoma of colon.  Encounter for well adult exam with abnormal findings Age and sex appropriate education and counseling updated with regular exercise and diet Referrals for preventative services - none needed Immunizations addressed - declines covid booster Smoking counseling  - none needed Evidence for depression or other mood disorder - none significant Most recent labs reviewed. I have personally reviewed and have noted: 1) the patient's medical and social history 2) The patient's current medications and supplements 3) The patient's height, weight, and BMI have been recorded in the chart   Vitamin D deficiency Last vitamin D Lab Results  Component Value Date   VD25OH 37.93 05/15/2022   Low, to start oral replacement  Mixed hyperlipidemia Lab Results  Component Value Date   LDLCALC 85 05/15/2022   Stable, pt to continue current statin lipitor 40 mg qd   Hyperglycemia Lab Results  Component Value Date   HGBA1C 6.6 (H) 05/15/2022   Stable, pt to continue current medical treatment  - diet, wt control   Cellulitis of left leg Persistent, for start clindamycin course,  to f/u any worsening symptoms or concerns  Followup: Return in about 6 months (around 11/15/2022).  [name], MD 05/16/2022 7:45 PM [facility]

## 2022-05-16 ENCOUNTER — Encounter: Payer: Self-pay | Admitting: Internal Medicine

## 2022-05-16 NOTE — Assessment & Plan Note (Signed)
Lab Results  Component Value Date   HGBA1C 6.6 (H) 05/15/2022   Stable, pt to continue current medical treatment  - diet, wt control

## 2022-05-16 NOTE — Assessment & Plan Note (Addendum)
Persistent, for start clindamycin course, f/u wound clinic as planned,  to f/u any worsening symptoms or concerns

## 2022-05-16 NOTE — Assessment & Plan Note (Signed)
Lab Results  Component Value Date   LDLCALC 85 05/15/2022   Stable, pt to continue current statin lipitor 40 mg qd

## 2022-05-16 NOTE — Assessment & Plan Note (Signed)

## 2022-05-16 NOTE — Assessment & Plan Note (Signed)
Last vitamin D Lab Results  Component Value Date   VD25OH 37.93 05/15/2022   Low, to start oral replacement

## 2022-05-20 ENCOUNTER — Telehealth: Payer: Self-pay | Admitting: Internal Medicine

## 2022-05-20 NOTE — Telephone Encounter (Signed)
Left message for pt to call back.  Pt states she is taking protonix every morning but still having epigastric burning. DIscussed with pt that she could add Pepcid '20mg'$  tab in am and pm if needed.

## 2022-05-20 NOTE — Telephone Encounter (Signed)
Inbound call from patient stating that she is having burning in her chest and is not able to eat. Patient is seeking advice on what she can take to help or if Dr. Hilarie Fredrickson can proscribe her something. Please advise.

## 2022-05-25 ENCOUNTER — Encounter (HOSPITAL_BASED_OUTPATIENT_CLINIC_OR_DEPARTMENT_OTHER): Payer: Medicare HMO | Attending: Internal Medicine | Admitting: Internal Medicine

## 2022-05-25 DIAGNOSIS — I130 Hypertensive heart and chronic kidney disease with heart failure and stage 1 through stage 4 chronic kidney disease, or unspecified chronic kidney disease: Secondary | ICD-10-CM | POA: Insufficient documentation

## 2022-05-25 DIAGNOSIS — L97822 Non-pressure chronic ulcer of other part of left lower leg with fat layer exposed: Secondary | ICD-10-CM | POA: Diagnosis not present

## 2022-05-25 DIAGNOSIS — E11622 Type 2 diabetes mellitus with other skin ulcer: Secondary | ICD-10-CM | POA: Diagnosis not present

## 2022-05-25 DIAGNOSIS — N183 Chronic kidney disease, stage 3 unspecified: Secondary | ICD-10-CM | POA: Insufficient documentation

## 2022-05-25 DIAGNOSIS — I87312 Chronic venous hypertension (idiopathic) with ulcer of left lower extremity: Secondary | ICD-10-CM | POA: Diagnosis not present

## 2022-05-25 DIAGNOSIS — I509 Heart failure, unspecified: Secondary | ICD-10-CM | POA: Insufficient documentation

## 2022-05-25 DIAGNOSIS — I872 Venous insufficiency (chronic) (peripheral): Secondary | ICD-10-CM | POA: Diagnosis not present

## 2022-05-25 DIAGNOSIS — E1122 Type 2 diabetes mellitus with diabetic chronic kidney disease: Secondary | ICD-10-CM | POA: Insufficient documentation

## 2022-05-25 DIAGNOSIS — W548XXA Other contact with dog, initial encounter: Secondary | ICD-10-CM | POA: Diagnosis not present

## 2022-05-25 NOTE — Progress Notes (Signed)
Brandy, LICHTENBERGER ([id number]) Visit Report for 05/25/2022 Chief Complaint Document Details Patient Name: Date of Service: [name] Brandy B. 05/25/2022 9:45 A M Medical Record Number: [id number] Patient Account Number: [id number] Date of Birth/Sex: Treating RN: 05-28-1946 (76 y.o. [name] Primary Care Provider: [name] Other Clinician: Referring Provider: Treating Provider/Extender: [name] in Treatment: 0 Information Obtained from: Patient Chief Complaint 05/25/2022; left lower extremity wound following a dog scratch Electronic Signature(s) Signed: 05/25/2022 10:37:15 AM By: Kalman Shan DO Entered By: Kalman Shan on 05/25/2022 10:23:42 -------------------------------------------------------------------------------- Debridement Details Patient Name: Date of Service: [name], CA RO Brandy B. 05/25/2022 9:45 A M Medical Record Number: [id number] Patient Account Number: [id number] Date of Birth/Sex: Treating RN: 1945-11-30 (76 y.o. [name], [name] Primary Care Provider: [name] Other Clinician: Referring Provider: Treating Provider/Extender: [name] in Treatment: 0 Debridement Performed for Assessment: Wound #1 Left,Lateral Lower Leg Performed By: Physician Kalman Shan, DO Debridement Type: Debridement Severity of Tissue Pre Debridement: Fat layer exposed Level of Consciousness (Pre-procedure): Awake and Alert Pre-procedure Verification/Time Out Yes - 10:10 Taken: Start Time: 10:11 Pain Control: Lidocaine 5% topical ointment T Area Debrided (L x W): otal 1 (cm) x 2.1 (cm) = 2.1 (cm) Tissue and other material debrided: Viable, Non-Viable, Slough, Subcutaneous, Slough Level: Skin/Subcutaneous Tissue Debridement Description: Excisional Instrument: Curette Bleeding: Minimum Hemostasis Achieved: Pressure End Time: 10:19 Procedural Pain: 0 Post Procedural Pain: 0 Response to Treatment: Procedure was  tolerated well Level of Consciousness (Post- Awake and Alert procedure): Post Debridement Measurements of Total Wound Length: (cm) 1 Width: (cm) 2.1 Depth: (cm) 0.3 Volume: (cm) 0.495 Character of Wound/Ulcer Post Debridement: Requires Further Debridement Severity of Tissue Post Debridement: Fat layer exposed Post Procedure Diagnosis Same as Pre-procedure Electronic Signature(s) Signed: 05/25/2022 10:37:15 AM By: Kalman Shan DO Signed: 05/25/2022 4:18:42 PM By: [name] RN, BSN Entered By: [name] on 05/25/2022 10:19:14 -------------------------------------------------------------------------------- HPI Details Patient Name: Date of Service: [name], CA RO Brandy B. 05/25/2022 9:45 A M Medical Record Number: [id number] Patient Account Number: [id number] Date of Birth/Sex: Treating RN: [birth year] (77 y.o. [name] Primary Care Provider: [name] Other Clinician: Referring Provider: Treating Provider/Extender: [name] in Treatment: 0 History of Present Illness HPI Description: Admission 05/25/2022 [name] is a 76 year old female with a past medical history of diet-controlled type 2 diabetes, chronic 3 stage kidney disease and venous insufficiency that presents to the clinic for a 1 month history of nonhealing ulcer to the left leg. She states that her dog scratched her causing a wound. She has developed cellulitis in this leg and has been treated with clindamycin by her primary care physician. She reports improvement in her symptoms. She currently denies signs of infection. She has been keeping the area covered. She does not wear compression stockings. She is on 80 mg of Lasix twice daily. She has had reflux studies to the left leg on 07/2021 that notes venous reflux throughout the left common femoral vein and greater saphenous vein. She has not had an ablation. She follows with vein and vascular for her venous  insufficiency. Electronic Signature(s) Signed: 05/25/2022 10:37:15 AM By: Kalman Shan DO Entered By: Kalman Shan on 05/25/2022 10:31:41 -------------------------------------------------------------------------------- Physical Exam Details Patient Name: Date of Service: [name] RO Brandy B. 05/25/2022 9:45 A M Medical Record Number: [id number] Patient Account Number: [id number] Date of Birth/Sex: Treating RN: 03-Aug-1946 (76 y.o. [name] Primary Care Provider:  [name] Other Clinician: Referring Provider: Treating Provider/Extender: [name] in Treatment: 0 Constitutional respirations regular, non-labored and within target range for patient.. Cardiovascular 2+ dorsalis pedis/posterior tibialis pulses. Psychiatric pleasant and cooperative. Notes Left lower extremity: T the lateral aspect there is an open wound with granulation tissue and nonviable tissue. No surrounding signs of infection including o increased warmth, erythema or purulent drainage. 2+ pitting edema to the knee. Electronic Signature(s) Signed: 05/25/2022 10:37:15 AM By: Kalman Shan DO Entered By: Kalman Shan on 05/25/2022 10:32:13 -------------------------------------------------------------------------------- Physician Orders Details Patient Name: Date of Service: [name], CA RO Brandy B. 05/25/2022 9:45 A M Medical Record Number: [id number] Patient Account Number: [id number] Date of Birth/Sex: Treating RN: 11-05-1945 (76 y.o. [name] Primary Care Provider: [name] Other Clinician: Referring Provider: Treating Provider/Extender: [name] in Treatment: 0 Verbal / Phone Orders: No Diagnosis Coding ICD-10 Coding Code Description [id number] Chronic venous hypertension (idiopathic) with ulcer of left lower extremity W54.8XXA Other contact with dog, initial encounter [phone] Non-pressure chronic ulcer of other part of left lower  leg with fat layer exposed E11.622 Type 2 diabetes mellitus with other skin ulcer Follow-up Appointments ppointment in 1 week. - Dr. Heber [place] and [place], Room 8 06/01/2022 0900 Monday Return A ppointment in 2 weeks. - Dr. Heber [place] and [place], Room 8 06/08/2022 0900 Monday Return A Other: - Purchase compression stockings. Information provided. Bathing/ Shower/ Hygiene May shower with protection but do not get wound dressing(s) wet. Edema Control - Lymphedema / SCD / Other Elevate legs to the level of the heart or above for 30 minutes daily and/or when sitting, a frequency of: - 2-3 times a day throughout the day. Avoid standing for long periods of time. Exercise regularly Moisturize legs daily. - apply to right leg every night before bed. Compression stocking or Garment 20-30 mm/Hg pressure to: - Patient to purchase compression stockings. Once you purchase apply in the morning and remove at night to right leg for now. Wound Treatment Wound #1 - Lower Leg Wound Laterality: Left, Lateral Topical: Gentamicin 1 x Per Week/30 Days Discharge Instructions: As directed by physician Prim Dressing: Hydrofera Blue Classic Foam, 2x2 in 1 x Per Week/30 Days ary Discharge Instructions: Moisten with saline prior to applying to wound bed Secondary Dressing: Woven Gauze Sponge, Non-Sterile 4x4 in 1 x Per Week/30 Days Discharge Instructions: Apply over primary dressing as directed. Secondary Dressing: Zetuvit Plus 4x8 in 1 x Per Week/30 Days Discharge Instructions: Apply over primary dressing as directed. Compression Wrap: ThreePress (3 layer compression wrap) 1 x Per Week/30 Days Discharge Instructions: Apply three layer compression as directed. Patient Medications llergies: doxycycline, oxycodone, soybean, Sulfa (Sulfonamide Antibiotics) A Notifications Medication Indication Start End lidocaine DOSE topical 5 % ointment - ointment topical applied only in clinic. Electronic Signature(s) Signed: 05/25/2022  10:37:15 AM By: Kalman Shan DO Signed: 05/25/2022 10:37:15 AM By: Kalman Shan DO Entered By: Kalman Shan on 05/25/2022 10:32:19 Prescription 05/25/2022 -------------------------------------------------------------------------------- Colin Ina B. Kalman Shan DO Patient Name: Provider: 1946/05/31 [id number] Date of Birth: NPI#: [id number] Sex: DEA #: [phone] Phone #: License #: [place] Patient Address: [facility] [address] [place], [place] [address] [place], [place] [address] [phone] Allergies doxycycline; oxycodone; soybean; Sulfa (Sulfonamide Antibiotics) Medication Medication: Route: Strength: Form: lidocaine 5 % topical ointment topical 5% ointment Class: TOPICAL LOCAL ANESTHETICS Dose: Frequency / Time: Indication: ointment topical applied only in clinic. Number  of Refills: Number of Units: 0 Generic Substitution: Start Date: End Date: One Time Use: Substitution Permitted No Note to Pharmacy: Hand Signature: Date(s): Electronic Signature(s) Signed: 05/25/2022 10:37:15 AM By: Kalman Shan DO Entered By: Kalman Shan on 05/25/2022 10:32:19 -------------------------------------------------------------------------------- Problem List Details Patient Name: Date of Service: [name], CA RO Brandy B. 05/25/2022 9:45 A M Medical Record Number: [id number] Patient Account Number: [id number] Date of Birth/Sex: Treating RN: [birth year] (76 y.o. [name] Primary Care Provider: [name] Other Clinician: Referring Provider: Treating Provider/Extender: [name] in Treatment: 0 Active Problems ICD-10 Encounter Code Description Active Date MDM Diagnosis I87.312 Chronic venous hypertension (idiopathic) with ulcer of left lower extremity 05/25/2022 No Yes W54.8XXA Other contact with dog, initial encounter 05/25/2022 No  Yes L97.822 Non-pressure chronic ulcer of other part of left lower leg with fat layer 05/25/2022 No Yes exposed E11.622 Type 2 diabetes mellitus with other skin ulcer 05/25/2022 No Yes Inactive Problems Resolved Problems Electronic Signature(s) Signed: 05/25/2022 10:37:15 AM By: Kalman Shan DO Entered By: Kalman Shan on 05/25/2022 10:05:50 -------------------------------------------------------------------------------- Progress Note Details Patient Name: Date of Service: [name], CA RO Brandy B. 05/25/2022 9:45 A M Medical Record Number: [id number] Patient Account Number: [id number] Date of Birth/Sex: Treating RN: 12-Jan-1946 (76 y.o. [name] Primary Care Provider: [name] Other Clinician: Referring Provider: Treating Provider/Extender: [name] in Treatment: 0 Subjective Chief Complaint Information obtained from Patient 05/25/2022; left lower extremity wound following a dog scratch History of Present Illness (HPI) Admission 05/25/2022 [name] is a 77 year old female with a past medical history of diet-controlled type 2 diabetes, chronic 3 stage kidney disease and venous insufficiency that presents to the clinic for a 1 month history of nonhealing ulcer to the left leg. She states that her dog scratched her causing a wound. She has developed cellulitis in this leg and has been treated with clindamycin by her primary care physician. She reports improvement in her symptoms. She currently denies signs of infection. She has been keeping the area covered. She does not wear compression stockings. She is on 80 mg of Lasix twice daily. She has had reflux studies to the left leg on 07/2021 that notes venous reflux throughout the left common femoral vein and greater saphenous vein. She has not had an ablation. She follows with vein and vascular for her venous insufficiency. Patient History Information obtained from Patient. Allergies doxycycline  (Reaction: rash itching), oxycodone, soybean, Sulfa (Sulfonamide Antibiotics) Family History Cancer - Siblings, Diabetes - Siblings, Heart Disease - Mother, Hypertension - Mother, No family history of Hereditary Spherocytosis, Kidney Disease, Lung Disease, Seizures, Stroke, Thyroid Problems, Tuberculosis. Social History Never smoker, Marital Status - Married, Alcohol Use - Never, Drug Use - No History, Caffeine Use - Moderate. Medical History Eyes Patient has history of Cataracts Denies history of Glaucoma Ear/Nose/Mouth/Throat Denies history of Chronic sinus problems/congestion, Middle ear problems Hematologic/Lymphatic Patient has history of Anemia Denies history of Hemophilia, Human Immunodeficiency Virus, Lymphedema, Sickle Cell Disease Respiratory Denies history of Aspiration, Asthma, Chronic Obstructive Pulmonary Disease (COPD), Pneumothorax, Sleep Apnea, Tuberculosis Cardiovascular Patient has history of Congestive Heart Failure, Hypertension Denies history of Angina, Arrhythmia, Coronary Artery Disease, Deep Vein Thrombosis, Hypotension, Myocardial Infarction, Peripheral Arterial Disease, Peripheral Venous Disease, Phlebitis, Vasculitis Gastrointestinal Denies history of Cirrhosis , Colitis, Crohnoos, Hepatitis A, Hepatitis B, Hepatitis C Endocrine Denies history of Type I Diabetes, Type II Diabetes Genitourinary Denies history of End Stage Renal Disease Immunological Denies history of Lupus Erythematosus,  Raynaudoos, Scleroderma Integumentary (Skin) Denies history of History of Burn Musculoskeletal Patient has history of Osteoarthritis Denies history of Gout, Rheumatoid Arthritis, Osteomyelitis Neurologic Denies history of Dementia, Neuropathy, Quadriplegia, Paraplegia, Seizure Disorder Oncologic Denies history of Received Chemotherapy Psychiatric Denies history of Anorexia/bulimia, Confinement Anxiety Hospitalization/Surgery History - right total knee  replacement. Medical A Surgical History Notes nd Gastrointestinal diverticulosis hiatal hernia hyperlipidemia Musculoskeletal DJD Review of Systems (ROS) Constitutional Symptoms (General Health) Denies complaints or symptoms of Fatigue, Fever, Chills, Marked Weight Change. Eyes Complains or has symptoms of Glasses / Contacts. Denies complaints or symptoms of Dry Eyes, Vision Changes. Ear/Nose/Mouth/Throat Denies complaints or symptoms of Chronic sinus problems or rhinitis. Respiratory Denies complaints or symptoms of Chronic or frequent coughs, Shortness of Breath. Gastrointestinal Denies complaints or symptoms of Frequent diarrhea, Nausea, Vomiting. Endocrine Denies complaints or symptoms of Heat/cold intolerance. Genitourinary Denies complaints or symptoms of Frequent urination. Integumentary (Skin) Complains or has symptoms of Wounds - left lower leg. Musculoskeletal Denies complaints or symptoms of Muscle Pain, Muscle Weakness. Neurologic Denies complaints or symptoms of Numbness/parasthesias. Psychiatric Denies complaints or symptoms of Claustrophobia, Suicidal. Objective Constitutional respirations regular, non-labored and within target range for patient.. Vitals Time Taken: 9:23 AM, Height: 63 in, Source: Stated, Temperature: 98.4 F, Pulse: 76 bpm, Respiratory Rate: 18 breaths/min, Blood Pressure: 148/81 mmHg. Cardiovascular 2+ dorsalis pedis/posterior tibialis pulses. Psychiatric pleasant and cooperative. General Notes: Left lower extremity: T the lateral aspect there is an open wound with granulation tissue and nonviable tissue. No surrounding signs of infection o including increased warmth, erythema or purulent drainage. 2+ pitting edema to the knee. Integumentary (Hair, Skin) Wound #1 status is Open. Original cause of wound was Laceration. The date acquired was: 04/24/2022. The wound is located on the Left,Lateral Lower Leg. The wound measures 1cm length x  2.1cm width x 0.3cm depth; 1.649cm^2 area and 0.495cm^3 volume. There is Fat Layer (Subcutaneous Tissue) exposed. There is no tunneling or undermining noted. There is a medium amount of serosanguineous drainage noted. The wound margin is distinct with the outline attached to the wound base. There is medium (34-66%) red granulation within the wound bed. There is a medium (34-66%) amount of necrotic tissue within the wound bed including Adherent Slough. Assessment Active Problems ICD-10 Chronic venous hypertension (idiopathic) with ulcer of left lower extremity Other contact with dog, initial encounter Non-pressure chronic ulcer of other part of left lower leg with fat layer exposed Type 2 diabetes mellitus with other skin ulcer Patient presents with a 1 month history of nonhealing ulcer to the left lower extremity caused by a dog scratch and complicated by venous insufficiency. I debrided nonviable tissue. I recommended compression therapy to help with wound healing. ABIs in office were 0.93 and she has strong pedal pulses suggesting adequate blood flow for healing. We will use gentamicin to address any bioburden and Hydrofera Blue to promote healing. All under 3 layer compression. She knows to not get the wrap wet or keep this on for more than 7 days.We also gave her information to obtain compression stockings. She will need to wear these daily once her wound heals. 46 minutes was spent on the encounter including face-to-face, EMR review and coordination of care. Procedures Wound #1 Pre-procedure diagnosis of Wound #1 is a Venous Leg Ulcer located on the Left,Lateral Lower Leg .Severity of Tissue Pre Debridement is: Fat layer exposed. There was a Excisional Skin/Subcutaneous Tissue Debridement with a total area of 2.1 sq cm performed by Kalman Shan, DO. With the following  instrument(s): Curette to remove Viable and Non-Viable tissue/material. Material removed includes Subcutaneous Tissue  and Slough and after achieving pain control using Lidocaine 5% topical ointment. A time out was conducted at 10:10, prior to the start of the procedure. A Minimum amount of bleeding was controlled with Pressure. The procedure was tolerated well with a pain level of 0 throughout and a pain level of 0 following the procedure. Post Debridement Measurements: 1cm length x 2.1cm width x 0.3cm depth; 0.495cm^3 volume. Character of Wound/Ulcer Post Debridement requires further debridement. Severity of Tissue Post Debridement is: Fat layer exposed. Post procedure Diagnosis Wound #1: Same as Pre-Procedure Pre-procedure diagnosis of Wound #1 is a Venous Leg Ulcer located on the Left,Lateral Lower Leg . There was a Three Layer Compression Therapy Procedure by [name], RN. Post procedure Diagnosis Wound #1: Same as Pre-Procedure Plan Follow-up Appointments: Return Appointment in 1 week. - Dr. Heber [place] and [place], Room 8 06/01/2022 0900 Monday Return Appointment in 2 weeks. - Dr. Heber [place] and [place], Room 8 06/08/2022 0900 Monday Other: - Purchase compression stockings. Information provided. Bathing/ Shower/ Hygiene: May shower with protection but do not get wound dressing(s) wet. Edema Control - Lymphedema / SCD / Other: Elevate legs to the level of the heart or above for 30 minutes daily and/or when sitting, a frequency of: - 2-3 times a day throughout the day. Avoid standing for long periods of time. Exercise regularly Moisturize legs daily. - apply to right leg every night before bed. Compression stocking or Garment 20-30 mm/Hg pressure to: - Patient to purchase compression stockings. Once you purchase apply in the morning and remove at night to right leg for now. The following medication(s) was prescribed: lidocaine topical 5 % ointment ointment topical applied only in clinic. was prescribed at facility WOUND #1: - Lower Leg Wound Laterality: Left, Lateral Topical: Gentamicin 1 x Per Week/30  Days Discharge Instructions: As directed by physician Prim Dressing: Hydrofera Blue Classic Foam, 2x2 in 1 x Per Week/30 Days ary Discharge Instructions: Moisten with saline prior to applying to wound bed Secondary Dressing: Woven Gauze Sponge, Non-Sterile 4x4 in 1 x Per Week/30 Days Discharge Instructions: Apply over primary dressing as directed. Secondary Dressing: Zetuvit Plus 4x8 in 1 x Per Week/30 Days Discharge Instructions: Apply over primary dressing as directed. Com pression Wrap: ThreePress (3 layer compression wrap) 1 x Per Week/30 Days Discharge Instructions: Apply three layer compression as directed. 1. In office sharp debridement 2. Gentamicin and Hydrofera Blue under 3 layer compression 3. Order compression stockings 4. Follow-up in 1 week Electronic Signature(s) Signed: 05/25/2022 10:37:15 AM By: Kalman Shan DO Signed: 05/25/2022 10:37:15 AM By: Kalman Shan DO Entered By: Kalman Shan on 05/25/2022 10:36:36 -------------------------------------------------------------------------------- HxROS Details Patient Name: Date of Service: [name], CA RO Brandy B. 05/25/2022 9:45 A M Medical Record Number: [id number] Patient Account Number: [id number] Date of Birth/Sex: Treating RN: October 21, 1945 (76 y.o. [name] Primary Care Provider: [name] Other Clinician: Referring Provider: Treating Provider/Extender: [name] in Treatment: 0 Information Obtained From Patient Constitutional Symptoms (General Health) Complaints and Symptoms: Negative for: Fatigue; Fever; Chills; Marked Weight Change Eyes Complaints and Symptoms: Positive for: Glasses / Contacts Negative for: Dry Eyes; Vision Changes Medical History: Positive for: Cataracts Negative for: Glaucoma Ear/Nose/Mouth/Throat Complaints and Symptoms: Negative for: Chronic sinus problems or rhinitis Medical History: Negative for: Chronic sinus problems/congestion; Middle ear  problems Respiratory Complaints and Symptoms: Negative for: Chronic or frequent coughs; Shortness of Breath Medical History: Negative for:  Aspiration; Asthma; Chronic Obstructive Pulmonary Disease (COPD); Pneumothorax; Sleep Apnea; Tuberculosis Gastrointestinal Complaints and Symptoms: Negative for: Frequent diarrhea; Nausea; Vomiting Medical History: Negative for: Cirrhosis ; Colitis; Crohns; Hepatitis A; Hepatitis B; Hepatitis C Past Medical History Notes: diverticulosis hiatal hernia hyperlipidemia Endocrine Complaints and Symptoms: Negative for: Heat/cold intolerance Medical History: Negative for: Type I Diabetes; Type II Diabetes Genitourinary Complaints and Symptoms: Negative for: Frequent urination Medical History: Negative for: End Stage Renal Disease Integumentary (Skin) Complaints and Symptoms: Positive for: Wounds - left lower leg Medical History: Negative for: History of Burn Musculoskeletal Complaints and Symptoms: Negative for: Muscle Pain; Muscle Weakness Medical History: Positive for: Osteoarthritis Negative for: Gout; Rheumatoid Arthritis; Osteomyelitis Past Medical History Notes: DJD Neurologic Complaints and Symptoms: Negative for: Numbness/parasthesias Medical History: Negative for: Dementia; Neuropathy; Quadriplegia; Paraplegia; Seizure Disorder Psychiatric Complaints and Symptoms: Negative for: Claustrophobia; Suicidal Medical History: Negative for: Anorexia/bulimia; Confinement Anxiety Hematologic/Lymphatic Medical History: Positive for: Anemia Negative for: Hemophilia; Human Immunodeficiency Virus; Lymphedema; Sickle Cell Disease Cardiovascular Medical History: Positive for: Congestive Heart Failure; Hypertension Negative for: Angina; Arrhythmia; Coronary Artery Disease; Deep Vein Thrombosis; Hypotension; Myocardial Infarction; Peripheral Arterial Disease; Peripheral Venous Disease; Phlebitis; Vasculitis Immunological Medical  History: Negative for: Lupus Erythematosus; Raynauds; Scleroderma Oncologic Medical History: Negative for: Received Chemotherapy HBO Extended History Items Eyes: Cataracts Immunizations Pneumococcal Vaccine: Received Pneumococcal Vaccination: Yes Received Pneumococcal Vaccination On or After 60th Birthday: Yes Implantable Devices None Hospitalization / Surgery History Type of Hospitalization/Surgery right total knee replacement Family and Social History Cancer: Yes - Siblings; Diabetes: Yes - Siblings; Heart Disease: Yes - Mother; Hereditary Spherocytosis: No; Hypertension: Yes - Mother; Kidney Disease: No; Lung Disease: No; Seizures: No; Stroke: No; Thyroid Problems: No; Tuberculosis: No; Never smoker; Marital Status - Married; Alcohol Use: Never; Drug Use: No History; Caffeine Use: Moderate; Financial Concerns: No; Food, Clothing or Shelter Needs: No; Support System Lacking: No; Transportation Concerns: No Electronic Signature(s) Signed: 05/25/2022 10:37:15 AM By: Kalman Shan DO Signed: 05/25/2022 12:02:49 PM By: [name] Signed: 05/25/2022 4:18:42 PM By: [name] RN, BSN Entered By: [name] on 05/25/2022 09:33:55 -------------------------------------------------------------------------------- SuperBill Details Patient Name: Date of Service: [name], [place] RO Brandy B. 05/25/2022 Medical Record Number: [id number] Patient Account Number: [id number] Date of Birth/Sex: Treating RN: 09-Sep-1946 (76 y.o. [name] Primary Care Provider: [name] Other Clinician: Referring Provider: Treating Provider/Extender: [name] in Treatment: 0 Diagnosis Coding ICD-10 Codes Code Description [id number] Chronic venous hypertension (idiopathic) with ulcer of left lower extremity W54.8XXA Other contact with dog, initial encounter [phone] Non-pressure chronic ulcer of other part of left lower leg with fat layer exposed E11.622 Type 2 diabetes  mellitus with other skin ulcer Facility Procedures CPT4 Code: 62831517 Description: 61607 - WOUND CARE VISIT-LEV 3 EST PT Modifier: Quantity: 1 CPT4 Code: 37106269 Description: 48546 - DEB SUBQ TISSUE 20 SQ CM/< ICD-10 Diagnosis Description L97.822 Non-pressure chronic ulcer of other part of left lower leg with fat layer expos Modifier: ed Quantity: 1 Physician Procedures : CPT4 Code Description Modifier [id number] 99204 - WC PHYS LEVEL 4 - NEW PT ICD-10 Diagnosis Description I87.312 Chronic venous hypertension (idiopathic) with ulcer of left lower extremity W54.8XXA Other contact with dog, initial encounter [phone]  Non-pressure chronic ulcer of other part of left lower leg with fat layer exposed E11.622 Type 2 diabetes mellitus with other skin ulcer Quantity: 1 : [id number] 11042 - WC PHYS SUBQ TISS 20 SQ CM ICD-10 Diagnosis Description L97.822 Non-pressure chronic ulcer of other part of left lower leg  with fat layer exposed Quantity: 1 Electronic Signature(s) Signed: 05/25/2022 10:37:15 AM By: Kalman Shan DO Entered By: Kalman Shan on 05/25/2022 10:36:53

## 2022-05-25 NOTE — Progress Notes (Signed)
JAZZMA, NEIDHARDT ([id number]) Visit Report for 05/25/2022 Allergy List Details Patient Name: Date of Service: [name] Brandy B. 05/25/2022 9:45 A M Medical Record Number: [id number] Patient Account Number: [id number] Date of Birth/Sex: Treating RN: Nov 17, 1945 (76 y.o. [name] Primary Care Koi Zangara: [name] Other Clinician: Referring Axle Parfait: Treating Aser Nylund/Extender: [name] in Treatment: 0 Allergies Active Allergies doxycycline Reaction: rash itching oxycodone soybean Sulfa (Sulfonamide Antibiotics) Allergy Notes Electronic Signature(s) Signed: 05/25/2022 12:02:49 PM By: [name] Entered By: [name] on 05/25/2022 09:27:09 -------------------------------------------------------------------------------- Arrival Information Details Patient Name: Date of Service: [name], Brandy RO Brandy B. 05/25/2022 9:45 A M Medical Record Number: [id number] Patient Account Number: [id number] Date of Birth/Sex: Treating RN: 1946-07-29 (76 y.o. [name], [name] Primary Care Estevan Kersh: [name] Other Clinician: Referring Kristel Durkee: Treating Jaydon Soroka/Extender: [name] in Treatment: 0 Visit Information Patient Arrived: Charlyn Minerva Time: 09:23 Accompanied By: son Transfer Assistance: None Patient Identification Verified: Yes Secondary Verification Process Completed: Yes Patient Requires Transmission-Based Precautions: No Patient Has Alerts: No Electronic Signature(s) Signed: 05/25/2022 12:02:49 PM By: [name] Entered By: [name] on 05/25/2022 09:23:54 -------------------------------------------------------------------------------- Clinic Level of Care Assessment Details Patient Name: Date of Service: [name] Brandy B. 05/25/2022 9:45 A M Medical Record Number: [id number] Patient Account Number: [id number] Date of Birth/Sex: Treating RN: 04/13/1946 (76 y.o. [name] Primary Care Ileane Sando:  [name] Other Clinician: Referring Tracker Mance: Treating Lavere Shinsky/Extender: [name] in Treatment: 0 Clinic Level of Care Assessment Items TOOL 1 Quantity Score X- 1 0 Use when EandM and Procedure is performed on INITIAL visit ASSESSMENTS - Nursing Assessment / Reassessment X- 1 20 General Physical Exam (combine w/ comprehensive assessment (listed just below) when performed on new pt. evals) X- 1 25 Comprehensive Assessment (HX, ROS, Risk Assessments, Wounds Hx, etc.) ASSESSMENTS - Wound and Skin Assessment / Reassessment X- 1 10 Dermatologic / Skin Assessment (not related to wound area) ASSESSMENTS - Ostomy and/or Continence Assessment and Care []  - 0 Incontinence Assessment and Management []  - 0 Ostomy Care Assessment and Management (repouching, etc.) PROCESS - Coordination of Care X - Simple Patient / Family Education for ongoing care 1 15 []  - 0 Complex (extensive) Patient / Family Education for ongoing care X- 1 10 Staff obtains Programmer, systems, Records, T Results / Process Orders est []  - 0 Staff telephones HHA, Nursing Homes / Clarify orders / etc []  - 0 Routine Transfer to another Facility (non-emergent condition) []  - 0 Routine Hospital Admission (non-emergent condition) X- 1 15 New Admissions / Biomedical engineer / Ordering NPWT Apligraf, etc. , []  - 0 Emergency Hospital Admission (emergent condition) PROCESS - Special Needs []  - 0 Pediatric / Minor Patient Management []  - 0 Isolation Patient Management []  - 0 Hearing / Language / Visual special needs []  - 0 Assessment of Community assistance (transportation, D/C planning, etc.) []  - 0 Additional assistance / Altered mentation []  - 0 Support Surface(s) Assessment (bed, cushion, seat, etc.) INTERVENTIONS - Miscellaneous []  - 0 External ear exam []  - 0 Patient Transfer (multiple staff / Civil Service fast streamer / Similar devices) []  - 0 Simple Staple / Suture removal (25 or less) []  -  0 Complex Staple / Suture removal (26 or more) []  - 0 Hypo/Hyperglycemic Management (do not check if billed separately) X- 1 15 Ankle / Brachial Index (ABI) - do not check if billed separately Has the patient been seen at the hospital within the last three  years: Yes Total Score: 110 Level Of Care: New/Established - Level 3 Electronic Signature(s) Signed: 05/25/2022 4:18:42 PM By: [name] RN, BSN Signed: 05/25/2022 4:18:42 PM By: [name] RN, BSN Entered By: [name] on 05/25/2022 10:19:58 -------------------------------------------------------------------------------- Compression Therapy Details Patient Name: Date of Service: [name], Brandy RO Brandy B. 05/25/2022 9:45 A M Medical Record Number: [id number] Patient Account Number: [id number] Date of Birth/Sex: Treating RN: July 01, 1946 (76 y.o. [name] Primary Care Pernella Ackerley: [name] Other Clinician: Referring Yusuke Beza: Treating Majorie Santee/Extender: [name] in Treatment: 0 Compression Therapy Performed for Wound Assessment: Wound #1 Left,Lateral Lower Leg Performed By: Clinician [name], RN Compression Type: Three Layer Post Procedure Diagnosis Same as Pre-procedure Electronic Signature(s) Signed: 05/25/2022 4:18:42 PM By: [name] RN, BSN Entered By: [name] on 05/25/2022 10:19:22 -------------------------------------------------------------------------------- Encounter Discharge Information Details Patient Name: Date of Service: [name], Brandy RO Brandy B. 05/25/2022 9:45 A M Medical Record Number: [id number] Patient Account Number: [id number] Date of Birth/Sex: Treating RN: June 07, 1946 (76 y.o. [name], [name] Primary Care Mahalie Kanner: [name] Other Clinician: Referring Mariya Mottley: Treating Adrielle Polakowski/Extender: [name] in Treatment: 0 Encounter Discharge Information Items Post Procedure Vitals Discharge Condition: Stable Temperature (F):  98.4 Ambulatory Status: Ambulatory Pulse (bpm): 76 Discharge Destination: Home Respiratory Rate (breaths/min): 18 Transportation: Private Auto Blood Pressure (mmHg): 148/81 Accompanied By: son Schedule Follow-up Appointment: Yes Clinical Summary of Care: Electronic Signature(s) Signed: 05/25/2022 4:18:42 PM By: [name] RN, BSN Entered By: [name] on 05/25/2022 10:20:35 -------------------------------------------------------------------------------- Lower Extremity Assessment Details Patient Name: Date of Service: [name] RO Brandy B. 05/25/2022 9:45 A M Medical Record Number: [id number] Patient Account Number: [id number] Date of Birth/Sex: Treating RN: 02/17/1946 (76 y.o. [name], [name] Primary Care Mykelle Cockerell: [name] Other Clinician: Referring Anwitha Mapes: Treating Avish Torry/Extender: [name] in Treatment: 0 Edema Assessment Assessed: [name]: Yes] Patrice Paradise: Yes] Edema: [Left: Ye] [Right: s] Calf Left: Right: Point of Measurement: 41 cm From Medial Instep 45.4 cm Ankle Left: Right: Point of Measurement: 8 cm From Medial Instep 25.4 cm Knee To Floor Left: Right: From Medial Instep 45 cm Vascular Assessment Pulses: Dorsalis Pedis Palpable: [Left:No] [Right:Yes] Doppler Audible: [Left:Yes] [Right:Yes] Posterior Tibial Palpable: [Left:Yes] [Right:Yes] Doppler Audible: [Left:Yes] [Right:Yes] Blood Pressure: Brachial: [Left:148] Ankle: [Left:Dorsalis Pedis: 138 0.93] [Right:Dorsalis Pedis: 138 0.93] Electronic Signature(s) Signed: 05/25/2022 4:18:42 PM By: [name] RN, BSN Entered By: [name] on 05/25/2022 10:34:42 -------------------------------------------------------------------------------- Multi Wound Chart Details Patient Name: Date of Service: [name], Brandy RO Brandy B. 05/25/2022 9:45 A M Medical Record Number: [id number] Patient Account Number: [id number] Date of Birth/Sex: Treating RN: November 01, 1945 (76 y.o. [name],  [name] Primary Care Kaspar Albornoz: [name] Other Clinician: Referring Tarrie Mcmichen: Treating Jamion Carter/Extender: [name] in Treatment: 0 Vital Signs Height(in): 58 Pulse(bpm): 38 Weight(lbs): Blood Pressure(mmHg): 148/81 Body Mass Index(BMI): Temperature(F): 98.4 Respiratory Rate(breaths/min): 18 Photos: [1:Left, Lateral Lower Leg] [N/A:N/A N/A N/A] Wound Location: [1:Laceration] [N/A:N/A N/A] Wounding Event: [1:Venous Leg Ulcer] [N/A:N/A N/A] Primary Etiology: [1:Lymphedema] [N/A:N/A N/A] Secondary Etiology: [1:Cataracts, Anemia, Congestive Heart N/A] [N/A:N/A] Comorbid History: [1:Failure, Hypertension, Osteoarthritis 04/24/2022] [N/A:N/A N/A] Date Acquired: [1:0] [N/A:N/A N/A] Weeks of Treatment: [1:Open] [N/A:N/A N/A] Wound Status: [1:No] [N/A:N/A N/A] Wound Recurrence: [1:1x2.1x0.3] [N/A:N/A N/A] Measurements L x W x D (cm) [1:1.649] [N/A:N/A N/A] A (cm) : rea [1:0.495] [N/A:N/A N/A] Volume (cm) : [1:0.00%] [N/A:N/A N/A] % Reduction in A [1:rea: 0.00%] [N/A:N/A N/A] % Reduction in Volume: [1:Full Thickness Without Exposed] [N/A:N/A N/A]  Classification: [1:Support Structures Medium] [N/A:N/A N/A] Exudate A mount: [1:Serosanguineous] [N/A:N/A N/A] Exudate Type: [1:red, brown] [N/A:N/A N/A] Exudate Color: [1:Distinct, outline attached] [N/A:N/A N/A] Wound Margin: [1:Medium (34-66%)] [N/A:N/A N/A] Granulation A mount: [1:Red] [N/A:N/A N/A] Granulation Quality: [1:Medium (34-66%)] [N/A:N/A N/A] Necrotic A mount: [1:Fat Layer (Subcutaneous Tissue): Yes N/A] [N/A:N/A] Exposed Structures: [1:Fascia: No Tendon: No Muscle: No Joint: No Bone: No Small (1-33%)] [N/A:N/A N/A] Epithelialization: [1:Debridement - Excisional] [N/A:N/A N/A] Debridement: Pre-procedure Verification/Time Out 10:10 [N/A:N/A N/A] Taken: [1:Lidocaine 5% topical ointment] [N/A:N/A N/A] Pain Control: [1:Subcutaneous, Slough] [N/A:N/A N/A] Tissue Debrided: [1:Skin/Subcutaneous Tissue]  [N/A:N/A N/A] Level: [1:2.1] [N/A:N/A N/A] Debridement A (sq cm): [1:rea Curette] [N/A:N/A N/A] Instrument: [1:Minimum] [N/A:N/A N/A] Bleeding: [1:Pressure] [N/A:N/A N/A] Hemostasis A chieved: [1:0] [N/A:N/A N/A] Procedural Pain: [1:0] [N/A:N/A N/A] Post Procedural Pain: [1:Procedure was tolerated well] [N/A:N/A N/A] Debridement Treatment Response: [1:1x2.1x0.3] [N/A:N/A N/A] Post Debridement Measurements L x W x D (cm) [1:0.495] [N/A:N/A N/A] Post Debridement Volume: (cm) [1:Compression Therapy] [N/A:N/A N/A] Procedures Performed: [1:Debridement] Treatment Notes Wound #1 (Lower Leg) Wound Laterality: Left, Lateral Cleanser Peri-Wound Care Topical Gentamicin Discharge Instruction: As directed by physician Primary Dressing Hydrofera Blue Classic Foam, 2x2 in Discharge Instruction: Moisten with saline prior to applying to wound bed Secondary Dressing Woven Gauze Sponge, Non-Sterile 4x4 in Discharge Instruction: Apply over primary dressing as directed. Zetuvit Plus 4x8 in Discharge Instruction: Apply over primary dressing as directed. Secured With Compression Wrap ThreePress (3 layer compression wrap) Discharge Instruction: Apply three layer compression as directed. Compression Stockings Add-Ons Electronic Signature(s) Signed: 05/25/2022 10:37:15 AM By: Kalman Shan DO Signed: 05/25/2022 4:18:42 PM By: [name] RN, BSN Entered By: Kalman Shan on 05/25/2022 10:23:22 -------------------------------------------------------------------------------- Multi-Disciplinary Care Plan Details Patient Name: Date of Service: [name], Brandy RO Brandy B. 05/25/2022 9:45 A M Medical Record Number: [id number] Patient Account Number: [id number] Date of Birth/Sex: Treating RN: Mar 04, 1946 (76 y.o. [name], [name] Primary Care Kylina Vultaggio: [name] Other Clinician: Referring Sophiagrace Benbrook: Treating Havard Radigan/Extender: [name] in Treatment: 0 Active  Inactive Orientation to the Wound Care Program Nursing Diagnoses: Knowledge deficit related to the wound healing center program Goals: Patient/caregiver will verbalize understanding of the [facility] Date Initiated: 05/25/2022 Target Resolution Date: 06/26/2022 Goal Status: Active Interventions: Provide education on orientation to the wound center Notes: Pain, Acute or Chronic Nursing Diagnoses: Pain, acute or chronic: actual or potential Potential alteration in comfort, pain Goals: Patient will verbalize adequate pain control and receive pain control interventions during procedures as needed Date Initiated: 05/25/2022 Target Resolution Date: 06/26/2022 Goal Status: Active Patient/caregiver will verbalize comfort level met Date Initiated: 05/25/2022 Target Resolution Date: 06/26/2022 Goal Status: Active Interventions: Complete pain assessment as per visit requirements Encourage patient to take pain medications as prescribed Provide education on pain management Treatment Activities: Administer pain control measures as ordered : 05/25/2022 Notes: Venous Leg Ulcer Nursing Diagnoses: Knowledge deficit related to disease process and management Goals: Non-invasive venous studies are completed as ordered Date Initiated: 05/25/2022 Target Resolution Date: 06/26/2022 Goal Status: Active Interventions: Assess peripheral edema status every visit. Provide education on venous insufficiency Treatment Activities: Non-invasive vascular studies : 05/25/2022 Notes: Wound/Skin Impairment Nursing Diagnoses: Knowledge deficit related to ulceration/compromised skin integrity Goals: Patient/caregiver will verbalize understanding of skin care regimen Date Initiated: 05/25/2022 Target Resolution Date: 06/26/2022 Goal Status: Active Ulcer/skin breakdown will heal within 14 weeks Date Initiated: 05/25/2022 Target Resolution Date: 08/21/2022 Goal Status: Active Interventions: Assess  patient/caregiver ability to obtain necessary supplies Assess patient/caregiver ability to perform ulcer/skin care  regimen upon admission and as needed Provide education on ulcer and skin care Treatment Activities: Skin care regimen initiated : 05/25/2022 Topical wound management initiated : 05/25/2022 Notes: Electronic Signature(s) Signed: 05/25/2022 4:18:42 PM By: [name] RN, BSN Entered By: [name] on 05/25/2022 10:10:53 -------------------------------------------------------------------------------- Pain Assessment Details Patient Name: Date of Service: [name], Brandy RO Brandy B. 05/25/2022 9:45 A M Medical Record Number: [id number] Patient Account Number: [id number] Date of Birth/Sex: Treating RN: January 28, 1946 (76 y.o. [name] Primary Care Soraiya Ahner: [name] Other Clinician: Referring Reniya Mcclees: Treating Leeon Makar/Extender: [name] in Treatment: 0 Active Problems Location of Pain Severity and Description of Pain Patient Has Paino No Site Locations Rate the pain. Current Pain Level: 0 Pain Management and Medication Current Pain Management: Medication: No Cold Application: No Rest: No Massage: No Activity: No T.E.N.S.: No Heat Application: No Leg drop or elevation: No Is the Current Pain Management Adequate: Adequate How does your wound impact your activities of daily livingo Sleep: No Bathing: No Appetite: No Relationship With Others: No Bladder Continence: No Emotions: No Bowel Continence: No Work: No Toileting: No Drive: No Dressing: No Hobbies: No Engineer, maintenance) Signed: 05/25/2022 4:18:42 PM By: [name] RN, BSN Entered By: [name] on 05/25/2022 10:11:46 -------------------------------------------------------------------------------- Patient/Caregiver Education Details Patient Name: Date of Service: [name] RO Brandy B. 8/7/2023andnbsp9:45 A M Medical Record Number: [id number] Patient Account  Number: [id number] Date of Birth/Gender: Treating RN: October 21, 1945 (76 y.o. [name] Primary Care Physician: [name] Other Clinician: Referring Physician: Treating Physician/Extender: [name] in Treatment: 0 Education Assessment Education Provided To: Patient Education Topics Provided Venous: Handouts: Controlling Swelling with Compression Stockings Methods: Explain/Verbal Responses: Reinforcements needed [place]: o Handouts: Welcome T [place] o Methods: Explain/Verbal Responses: Reinforcements needed Wound/Skin Impairment: Handouts: Caring for Your Ulcer, Skin Care Do's and Dont's Methods: Explain/Verbal Responses: Reinforcements needed Electronic Signature(s) Signed: 05/25/2022 4:18:42 PM By: [name] RN, BSN Entered By: [name] on 05/25/2022 10:11:16 -------------------------------------------------------------------------------- Wound Assessment Details Patient Name: Date of Service: [name], Brandy RO Brandy B. 05/25/2022 9:45 A M Medical Record Number: [id number] Patient Account Number: [id number] Date of Birth/Sex: Treating RN: [birth year] (76 y.o. [name], [name] Primary Care Kellie Murrill: [name] Other Clinician: Referring Hadi Dubin: Treating Khloie Hamada/Extender: [name] in Treatment: 0 Wound Status Wound Number: 1 Primary Venous Leg Ulcer Etiology: Wound Location: Left, Lateral Lower Leg Secondary Lymphedema Wounding Event: Laceration Etiology: Date Acquired: 04/24/2022 Wound Status: Open Weeks Of Treatment: 0 Comorbid Cataracts, Anemia, Congestive Heart Failure, Hypertension, Clustered Wound: No History: Osteoarthritis Photos Wound Measurements Length: (cm) 1 Width: (cm) 2.1 Depth: (cm) 0.3 Area: (cm) 1.649 Volume: (cm) 0.495 % Reduction in Area: 0% % Reduction in Volume: 0% Epithelialization: Small (1-33%) Tunneling: No Undermining: No Wound  Description Classification: Full Thickness Without Exposed Support Structures Wound Margin: Distinct, outline attached Exudate Amount: Medium Exudate Type: Serosanguineous Exudate Color: red, brown Foul Odor After Cleansing: No Slough/Fibrino Yes Wound Bed Granulation Amount: Medium (34-66%) Exposed Structure Granulation Quality: Red Fascia Exposed: No Necrotic Amount: Medium (34-66%) Fat Layer (Subcutaneous Tissue) Exposed: Yes Necrotic Quality: Adherent Slough Tendon Exposed: No Muscle Exposed: No Joint Exposed: No Bone Exposed: No Treatment Notes Wound #1 (Lower Leg) Wound Laterality: Left, Lateral Cleanser Peri-Wound Care Topical Gentamicin Discharge Instruction: As directed by physician Primary Dressing Hydrofera Blue Classic Foam, 2x2 in Discharge Instruction: Moisten with saline prior to applying to wound bed Secondary Dressing  Woven Gauze Sponge, Non-Sterile 4x4 in Discharge Instruction: Apply over primary dressing as directed. Zetuvit Plus 4x8 in Discharge Instruction: Apply over primary dressing as directed. Secured With Compression Wrap ThreePress (3 layer compression wrap) Discharge Instruction: Apply three layer compression as directed. Compression Stockings Add-Ons Electronic Signature(s) Signed: 05/25/2022 4:18:42 PM By: [name] RN, BSN Entered By: [name] on 05/25/2022 10:09:56 -------------------------------------------------------------------------------- Vitals Details Patient Name: Date of Service: [name], Brandy RO Brandy B. 05/25/2022 9:45 A M Medical Record Number: [id number] Patient Account Number: [id number] Date of Birth/Sex: Treating RN: [birth year] (76 y.o. [name] Primary Care Roma Bondar: [name] Other Clinician: Referring Damarien Nyman: Treating Maikayla Beggs/Extender: [name] in Treatment: 0 Vital Signs Time Taken: 09:23 Temperature (F): 98.4 Height (in): 63 Pulse (bpm): 76 Source:  Stated Respiratory Rate (breaths/min): 18 Blood Pressure (mmHg): 148/81 Reference Range: 80 - 120 mg / dl Electronic Signature(s) Signed: 05/25/2022 12:02:49 PM By: [name] Entered By: [name] on 05/25/2022 09:26:05

## 2022-05-25 NOTE — Progress Notes (Signed)
FLOIS, MCTAGUE ([id number]) Visit Report for 05/25/2022 Abuse Risk Screen Details Patient Name: Date of Service: [name]. 05/25/2022 9:45 A M Medical Record Number: [id number] Patient Account Number: [id number] Date of Birth/Sex: Treating RN: [name] (76 y.o. [name], [name] Primary Care [name]: [name] Other Clinician: Referring [name]: Treating [name]/Extender: [name] in Treatment: 0 Abuse Risk Screen Items Answer ABUSE RISK SCREEN: Has anyone close to you tried to hurt or harm you recentlyo No Do you feel uncomfortable with anyone in your familyo No Has anyone forced you do things that you didnt want to doo No Electronic Signature(s) Signed: 05/25/2022 12:02:49 PM By: [name] Signed: 05/25/2022 4:18:42 PM By: Brandy Pilling RN, BSN Entered By: [name] on 05/25/2022 09:34:14 -------------------------------------------------------------------------------- Activities of Daily Living Details Patient Name: Date of Service: [name]. 05/25/2022 9:45 A M Medical Record Number: [id number] Patient Account Number: [id number] Date of Birth/Sex: Treating RN: [birth year] [name] (76 y.o. [name], [name] Primary Care [name]: [name] Other Clinician: Referring [name]: Treating [name]/Extender: [name] in Treatment: 0 Activities of Daily Living Items Answer Activities of Daily Living (Please select one for each item) Drive Automobile Not Able T Medications ake Completely Able Use T elephone Completely Able Care for Appearance Completely Able Use T oilet Completely Able Bath / Shower Completely Able Dress Self Completely Able Feed Self Completely Able Walk Completely Able Get In / Out Bed Completely Able Housework Completely Able Prepare Meals Completely Able Handle Money Completely Able Shop for Self Need Assistance Electronic Signature(s) Signed: 05/25/2022 12:02:49 PM By: [name] Signed: 05/25/2022 4:18:42 PM By: Brandy Pilling RN, BSN Entered By: [name] on 05/25/2022 09:34:43 -------------------------------------------------------------------------------- Education Screening Details Patient Name: Date of Service: [name], [name]. 05/25/2022 9:45 A M Medical Record Number: [id number] Patient Account Number: [id number] Date of Birth/Sex: Treating RN: [name] (76 y.o. [name], [name] Primary Care [name]: [name] Other Clinician: Referring [name]: Treating [name]/Extender: [name] in Treatment: 0 Learning Preferences/Education Level/Primary Language Learning Preference: Explanation, Demonstration, Printed Material Highest Education Level: High School Preferred Language: English Cognitive Barrier Language Barrier: No Translator Needed: No Memory Deficit: No Emotional Barrier: No Cultural/Religious Beliefs Affecting Medical Care: No Physical Barrier Impaired Vision: Yes Glasses Impaired Hearing: No Decreased Hand dexterity: No Knowledge/Comprehension Knowledge Level: High Comprehension Level: High Ability to understand written instructions: High Ability to understand verbal instructions: High Motivation Anxiety Level: Calm Cooperation: Cooperative Education Importance: Acknowledges Need Interest in Health Problems: Asks Questions Perception: Coherent Willingness to Engage in Self-Management High Activities: Readiness to Engage in Self-Management High Activities: Electronic Signature(s) Signed: 05/25/2022 12:02:49 PM By: [name] Signed: 05/25/2022 4:18:42 PM By: Brandy Pilling RN, BSN Entered By: [name] on 05/25/2022 09:35:14 -------------------------------------------------------------------------------- Fall Risk Assessment Details Patient Name: Date of Service: [name], [name]. 05/25/2022 9:45 A M Medical Record Number: [id number] Patient Account Number: [id number] Date  of Birth/Sex: Treating RN: [birth year] (76 y.o. [name], [name] Primary Care [name]: [name] Other Clinician: Referring [name]: Treating [name]/Extender: [name] in Treatment: 0 Fall Risk Assessment Items Have you had 2 or more falls in the last 12 monthso 0 Yes Have you had any fall that resulted in injury in the last 12 monthso 0 Yes FALLS RISK SCREEN History of falling - immediate or within 3 months 0 No Secondary diagnosis (Do you have 2 or more medical diagnoseso) 0  No Ambulatory aid None/bed rest/wheelchair/nurse 0 No Crutches/cane/walker 15 Yes Furniture 0 No Intravenous therapy Access/Saline/Heparin Lock 0 No Gait/Transferring Normal/ bed rest/ wheelchair 0 No Weak (short steps with or without shuffle, stooped but able to lift head while walking, Brandy seek 10 Yes support from furniture) Impaired (short steps with shuffle, Brandy have difficulty arising from chair, head down, impaired 0 No balance) Mental Status Oriented to own ability 0 Yes Electronic Signature(s) Signed: 05/25/2022 12:02:49 PM By: [name] Signed: 05/25/2022 4:18:42 PM By: Brandy Pilling RN, BSN Entered By: [name] on 05/25/2022 09:35:58 -------------------------------------------------------------------------------- Foot Assessment Details Patient Name: Date of Service: [name], [name]. 05/25/2022 9:45 A M Medical Record Number: [id number] Patient Account Number: [id number] Date of Birth/Sex: Treating RN: [name] (76 y.o. [name] Primary Care [name]: [name] Other Clinician: Referring [name]: Treating [name]/Extender: [name] in Treatment: 0 Foot Assessment Items Site Locations + = Sensation present, - = Sensation absent, C = Callus, U = Ulcer R = Redness, W = Warmth, M = Maceration, PU = Pre-ulcerative lesion F = Fissure, S = Swelling, D = Dryness Assessment Right: Left: Other Deformity: No  No Prior Foot Ulcer: No No Prior Amputation: No No Charcot Joint: No No Ambulatory Status: Ambulatory With Help Assistance Device: Walker Gait: Steady Electronic Signature(s) Signed: 05/25/2022 12:02:49 PM By: [name] Signed: 05/25/2022 4:18:42 PM By: Brandy Pilling RN, BSN Entered By: [name] on 05/25/2022 09:40:53 -------------------------------------------------------------------------------- Nutrition Risk Screening Details Patient Name: Date of Service: [name]. 05/25/2022 9:45 A M Medical Record Number: [id number] Patient Account Number: [id number] Date of Birth/Sex: Treating RN: [birth year][name] (76 y.o. [name] Primary Care Franchon Ketterman: [name] Other Clinician: Referring Minoru Chap: Treating Murriel Holwerda/Extender: [name] in Treatment: 0 Height (in): 63 Weight (lbs): Body Mass Index (BMI): Nutrition Risk Screening Items Score Screening NUTRITION RISK SCREEN: I have an illness or condition that made me change the kind and/or amount of food I eat 2 Yes I eat fewer than two meals per day 0 No I eat few fruits and vegetables, or milk products 0 No I have three or more drinks of beer, liquor or wine almost every day 0 No I have tooth or mouth problems that make it hard for me to eat 0 No I don't always have enough money to buy the food I need 0 No I eat alone most of the time 0 No I take three or more different prescribed or over-the-counter drugs a day 1 Yes Without wanting to, I have lost or gained 10 pounds in the last six months 0 No I am not always physically able to shop, cook and/or feed myself 0 No Nutrition Protocols Good Risk Protocol Moderate Risk Protocol 0 Provide education on nutrition High Risk Proctocol Risk Level: Moderate Risk Score: 3 Electronic Signature(s) Signed: 05/25/2022 12:02:49 PM By: [name] Signed: 05/25/2022 4:18:42 PM By: Brandy Pilling RN, BSN Entered By: [name] on 05/25/2022  09:36:36

## 2022-05-29 ENCOUNTER — Other Ambulatory Visit: Payer: Self-pay | Admitting: Internal Medicine

## 2022-06-01 ENCOUNTER — Encounter (HOSPITAL_BASED_OUTPATIENT_CLINIC_OR_DEPARTMENT_OTHER): Payer: Medicare HMO | Admitting: Internal Medicine

## 2022-06-01 DIAGNOSIS — L97822 Non-pressure chronic ulcer of other part of left lower leg with fat layer exposed: Secondary | ICD-10-CM | POA: Diagnosis not present

## 2022-06-01 DIAGNOSIS — E1122 Type 2 diabetes mellitus with diabetic chronic kidney disease: Secondary | ICD-10-CM | POA: Diagnosis not present

## 2022-06-01 DIAGNOSIS — E11622 Type 2 diabetes mellitus with other skin ulcer: Secondary | ICD-10-CM | POA: Diagnosis not present

## 2022-06-01 DIAGNOSIS — N183 Chronic kidney disease, stage 3 unspecified: Secondary | ICD-10-CM | POA: Diagnosis not present

## 2022-06-01 DIAGNOSIS — I509 Heart failure, unspecified: Secondary | ICD-10-CM | POA: Diagnosis not present

## 2022-06-01 DIAGNOSIS — I872 Venous insufficiency (chronic) (peripheral): Secondary | ICD-10-CM | POA: Diagnosis not present

## 2022-06-01 DIAGNOSIS — I130 Hypertensive heart and chronic kidney disease with heart failure and stage 1 through stage 4 chronic kidney disease, or unspecified chronic kidney disease: Secondary | ICD-10-CM | POA: Diagnosis not present

## 2022-06-05 NOTE — Progress Notes (Signed)
CHANDEL, ZAUN ([id number]) Visit Report for [name] Chief Complaint Document Details Patient Name: Date of Service: [name], [place] [name] 9:00 A M Medical Record Number: [id number] Patient Account Number: [id number] Date of Birth/Sex: Treating RN: 13-Jul-1946 (76 y.o. F) Primary Care Provider: [name] Other Clinician: Referring Provider: Treating Provider/Extender: Hollie Beach in Treatment: 1 Information Obtained from: Patient Chief Complaint 05/25/2022; left lower extremity wound following a dog scratch Electronic Signature(s) Signed: 06/01/2022 10:32:19 AM By: Kalman Shan DO Entered By: Kalman Shan on 06/01/2022 10:06:12 -------------------------------------------------------------------------------- Debridement Details Patient Name: Date of Service: [name], [place] [name] 9:00 A M Medical Record Number: [id number] Patient Account Number: [id number] Date of Birth/Sex: Treating RN: [birth year] (76 y.o. [name] Primary Care Provider: [name] Other Clinician: Referring Provider: Treating Provider/Extender: Hollie Beach in Treatment: 1 Debridement Performed for Assessment: Wound #1 Left,Lateral Lower Leg Performed By: Physician Kalman Shan, DO Debridement Type: Debridement Severity of Tissue Pre Debridement: Fat layer exposed Level of Consciousness (Pre-procedure): Awake and Alert Pre-procedure Verification/Time Out Yes - 09:35 Taken: Start Time: 09:36 Pain Control: Lidocaine 5% topical ointment T Area Debrided (L x W): otal 0.8 (cm) x 2.1 (cm) = 1.68 (cm) Tissue and other material debrided: Viable, Non-Viable, Slough, Subcutaneous, Fibrin/Exudate, Slough Level: Skin/Subcutaneous Tissue Debridement Description: Excisional Instrument: Curette Bleeding: Minimum Hemostasis Achieved: Pressure End Time: 09:40 Procedural Pain: 0 Post Procedural Pain: 0 Response to Treatment:  Procedure was tolerated well Level of Consciousness (Post- Awake and Alert procedure): Post Debridement Measurements of Total Wound Length: (cm) 0.8 Width: (cm) 2.1 Depth: (cm) 0.3 Volume: (cm) 0.396 Character of Wound/Ulcer Post Debridement: Improved Severity of Tissue Post Debridement: Fat layer exposed Post Procedure Diagnosis Same as Pre-procedure Electronic Signature(s) Signed: 06/01/2022 10:32:19 AM By: Kalman Shan DO Signed: 06/01/2022 4:35:20 PM By: Brandy Pilling RN, BSN Entered By: [name] on 06/01/2022 09:40:32 -------------------------------------------------------------------------------- HPI Details Patient Name: Date of Service: [name], [place] [name] 9:00 A M Medical Record Number: [id number] Patient Account Number: [id number] Date of Birth/Sex: Treating RN: 03/26/1946 (76 y.o. F) Primary Care Provider: [name] Other Clinician: Referring Provider: Treating Provider/Extender: Hollie Beach in Treatment: 1 History of Present Illness HPI Description: Admission 05/25/2022 [name] is a 76 year old female with a past medical history of diet-controlled type 2 diabetes, chronic 3 stage kidney disease and venous insufficiency that presents to the clinic for a 1 month history of nonhealing ulcer to the left leg. She states that her dog scratched her causing a wound. She has developed cellulitis in this leg and has been treated with clindamycin by her primary care physician. She reports improvement in her symptoms. She currently denies signs of infection. She has been keeping the area covered. She does not wear compression stockings. She is on 80 mg of Lasix twice daily. She has had reflux studies to the left leg on 07/2021 that notes venous reflux throughout the left common femoral vein and greater saphenous vein. She has not had an ablation. She follows with vein and vascular for her venous insufficiency. 8/14; patient  presents for follow-up. She states that the wrap stayed on for 4 days and eventually slid down. She has been wearing her compression stocking and doing dressing changes with Hydrofera Blue since. She also states she has a hard time putting on her shoes with the 3 layer compression. She denies signs of infection. Electronic Signature(s) Signed: 06/01/2022 10:32:19 AM By:  Kalman Shan DO Entered By: Kalman Shan on 06/01/2022 10:09:55 -------------------------------------------------------------------------------- Physical Exam Details Patient Name: Date of Service: [name] [name] 9:00 A M Medical Record Number: [id number] Patient Account Number: [id number] Date of Birth/Sex: Treating RN: [birth year] (76 y.o. F) Primary Care Provider: [name] Other Clinician: Referring Provider: Treating Provider/Extender: Hollie Beach in Treatment: 1 Constitutional respirations regular, non-labored and within target range for patient.. Cardiovascular 2+ dorsalis pedis/posterior tibialis pulses. Psychiatric pleasant and cooperative. Notes Left lower extremity: T the lateral aspect there is an open wound with granulation tissue and nonviable tissue. No surrounding signs of infection including o increased warmth, erythema or purulent drainage. 2+ pitting edema to the knee. Electronic Signature(s) Signed: 06/01/2022 10:32:19 AM By: Kalman Shan DO Entered By: Kalman Shan on 06/01/2022 10:07:50 -------------------------------------------------------------------------------- Physician Orders Details Patient Name: Date of Service: [name], [place] [name] 9:00 A M Medical Record Number: [id number] Patient Account Number: [id number] Date of Birth/Sex: Treating RN: [birth year] (76 y.o. [name] Primary Care Provider: [name] Other Clinician: Referring Provider: Treating Provider/Extender: Hollie Beach in  Treatment: 1 Verbal / Phone Orders: No Diagnosis Coding ICD-10 Coding Code Description [phone] Chronic venous hypertension (idiopathic) with ulcer of left lower extremity W54.8XXA Other contact with dog, initial encounter [phone] Non-pressure chronic ulcer of other part of left lower leg with fat layer exposed E11.622 Type 2 diabetes mellitus with other skin ulcer Follow-up Appointments ppointment in 1 week. - Dr. Heber [place] and [place], Room 8 06/08/2022 0900 Monday Return A ppointment in 2 weeks. - Dr. Heber [place] and [place], Room 8 06/15/2022 0900 Monday Return A Other: - Purchase compression stockings. Information provided. Anesthetic (In clinic) Topical Lidocaine 5% applied to wound bed Bathing/ Shower/ Hygiene May shower with protection but do not get wound dressing(s) wet. Edema Control - Lymphedema / SCD / Other Elevate legs to the level of the heart or above for 30 minutes daily and/or when sitting, a frequency of: - 2-3 times a day throughout the day. Avoid standing for long periods of time. Exercise regularly Moisturize legs daily. - apply to right leg every night before bed. Compression stocking or Garment 20-30 mm/Hg pressure to: - Patient to purchase compression stockings. Once you purchase apply in the morning and remove at night to right leg for now. Wound Treatment Wound #1 - Lower Leg Wound Laterality: Left, Lateral Topical: Gentamicin 1 x Per Week/30 Days Discharge Instructions: As directed by physician Prim Dressing: Hydrofera Blue Classic Foam, 2x2 in 1 x Per Week/30 Days ary Discharge Instructions: Moisten with saline prior to applying to wound bed Secondary Dressing: Woven Gauze Sponge, Non-Sterile 4x4 in 1 x Per Week/30 Days Discharge Instructions: Apply over primary dressing as directed. Secondary Dressing: Zetuvit Plus 4x8 in 1 x Per Week/30 Days Discharge Instructions: Apply over primary dressing as directed. Secured With: unna boot first layer 1 x Per Week/30  Days Discharge Instructions: apply first layer of unna boot to upper portion of lower leg to secure compression wrap in place. Compression Wrap: CoFlex TLC XL 2-layer Compression System 4x7 (in/yd) 1 x Per Week/30 Days Discharge Instructions: Apply CoFlex 2-layer compression as directed. (alt for 4 layer) Electronic Signature(s) Signed: 06/01/2022 10:32:19 AM By: Kalman Shan DO Signed: 06/01/2022 10:32:19 AM By: Kalman Shan DO Entered By: Kalman Shan on 06/01/2022 10:08:07 -------------------------------------------------------------------------------- Problem List Details Patient Name: Date of Service: [name], [place] [name] 9:00 A M Medical Record Number: [id number] Patient  Account Number: [id number] Date of Birth/Sex: Treating RN: 05-09-1946 (76 y.o. [name] Primary Care Provider: [name] Other Clinician: Referring Provider: Treating Provider/Extender: Hollie Beach in Treatment: 1 Active Problems ICD-10 Encounter Code Description Active Date MDM Diagnosis I87.312 Chronic venous hypertension (idiopathic) with ulcer of left lower extremity 05/25/2022 No Yes W54.8XXA Other contact with dog, initial encounter 05/25/2022 No Yes L97.822 Non-pressure chronic ulcer of other part of left lower leg with fat layer 05/25/2022 No Yes exposed E11.622 Type 2 diabetes mellitus with other skin ulcer 05/25/2022 No Yes Inactive Problems Resolved Problems Electronic Signature(s) Signed: 06/01/2022 10:32:19 AM By: Kalman Shan DO Entered By: Kalman Shan on 06/01/2022 10:05:57 -------------------------------------------------------------------------------- Progress Note Details Patient Name: Date of Service: [name], [place] [name] 9:00 A M Medical Record Number: [id number] Patient Account Number: [id number] Date of Birth/Sex: Treating RN: 1946-07-20 (76 y.o. F) Primary Care Provider: [name] Other Clinician: Referring  Provider: Treating Provider/Extender: Hollie Beach in Treatment: 1 Subjective Chief Complaint Information obtained from Patient 05/25/2022; left lower extremity wound following a dog scratch History of Present Illness (HPI) Admission 05/25/2022 [name] is a 76 year old female with a past medical history of diet-controlled type 2 diabetes, chronic 3 stage kidney disease and venous insufficiency that presents to the clinic for a 1 month history of nonhealing ulcer to the left leg. She states that her dog scratched her causing a wound. She has developed cellulitis in this leg and has been treated with clindamycin by her primary care physician. She reports improvement in her symptoms. She currently denies signs of infection. She has been keeping the area covered. She does not wear compression stockings. She is on 80 mg of Lasix twice daily. She has had reflux studies to the left leg on 07/2021 that notes venous reflux throughout the left common femoral vein and greater saphenous vein. She has not had an ablation. She follows with vein and vascular for her venous insufficiency. 8/14; patient presents for follow-up. She states that the wrap stayed on for 4 days and eventually slid down. She has been wearing her compression stocking and doing dressing changes with Hydrofera Blue since. She also states she has a hard time putting on her shoes with the 3 layer compression. She denies signs of infection. Patient History Information obtained from Patient. Family History Cancer - Siblings, Diabetes - Siblings, Heart Disease - Mother, Hypertension - Mother, No family history of Hereditary Spherocytosis, Kidney Disease, Lung Disease, Seizures, Stroke, Thyroid Problems, Tuberculosis. Social History Never smoker, Marital Status - Married, Alcohol Use - Never, Drug Use - No History, Caffeine Use - Moderate. Medical History Eyes Patient has history of Cataracts Denies history  of Glaucoma Ear/Nose/Mouth/Throat Denies history of Chronic sinus problems/congestion, Middle ear problems Hematologic/Lymphatic Patient has history of Anemia Denies history of Hemophilia, Human Immunodeficiency Virus, Lymphedema, Sickle Cell Disease Respiratory Denies history of Aspiration, Asthma, Chronic Obstructive Pulmonary Disease (COPD), Pneumothorax, Sleep Apnea, Tuberculosis Cardiovascular Patient has history of Congestive Heart Failure, Hypertension Denies history of Angina, Arrhythmia, Coronary Artery Disease, Deep Vein Thrombosis, Hypotension, Myocardial Infarction, Peripheral Arterial Disease, Peripheral Venous Disease, Phlebitis, Vasculitis Gastrointestinal Denies history of Cirrhosis , Colitis, Crohnoos, Hepatitis A, Hepatitis B, Hepatitis C Endocrine Denies history of Type I Diabetes, Type II Diabetes Genitourinary Denies history of End Stage Renal Disease Immunological Denies history of Lupus Erythematosus, Raynaudoos, Scleroderma Integumentary (Skin) Denies history of History of Burn Musculoskeletal Patient has history of Osteoarthritis Denies history of Gout, Rheumatoid  Arthritis, Osteomyelitis Neurologic Denies history of Dementia, Neuropathy, Quadriplegia, Paraplegia, Seizure Disorder Oncologic Denies history of Received Chemotherapy Psychiatric Denies history of Anorexia/bulimia, Confinement Anxiety Hospitalization/Surgery History - right total knee replacement. Medical A Surgical History Notes nd Gastrointestinal diverticulosis hiatal hernia hyperlipidemia Musculoskeletal DJD Objective Constitutional respirations regular, non-labored and within target range for patient.. Vitals Time Taken: 9:08 AM, Height: 63 in, Temperature: 98.3 F, Pulse: 80 bpm, Respiratory Rate: 20 breaths/min, Blood Pressure: 144/82 mmHg. Cardiovascular 2+ dorsalis pedis/posterior tibialis pulses. Psychiatric pleasant and cooperative. General Notes: Left lower extremity: T  the lateral aspect there is an open wound with granulation tissue and nonviable tissue. No surrounding signs of infection o including increased warmth, erythema or purulent drainage. 2+ pitting edema to the knee. Integumentary (Hair, Skin) Wound #1 status is Open. Original cause of wound was Laceration. The date acquired was: 04/24/2022. The wound has been in treatment 1 weeks. The wound is located on the Left,Lateral Lower Leg. The wound measures 0.8cm length x 2.1cm width x 0.3cm depth; 1.319cm^2 area and 0.396cm^3 volume. There is Fat Layer (Subcutaneous Tissue) exposed. There is no tunneling or undermining noted. There is a medium amount of serosanguineous drainage noted. The wound margin is distinct with the outline attached to the wound base. There is medium (34-66%) red granulation within the wound bed. There is a medium (34-66%) amount of necrotic tissue within the wound bed including Adherent Slough. Assessment Active Problems ICD-10 Chronic venous hypertension (idiopathic) with ulcer of left lower extremity Other contact with dog, initial encounter Non-pressure chronic ulcer of other part of left lower leg with fat layer exposed Type 2 diabetes mellitus with other skin ulcer Patient's wound has shown improvement in size in appearance since last clinic visit. I debrided nonviable tissue. I recommended continuing the course with antibiotic ointment with Hydrofera Blue under Compression therapy. We will place an Unna boot layer at the very top Of the wrap to help this from not sliding. We will also switch to 2 layer Coflex to help her fit her shoes better. Follow-up in 1 week. Procedures Wound #1 Pre-procedure diagnosis of Wound #1 is a Venous Leg Ulcer located on the Left,Lateral Lower Leg .Severity of Tissue Pre Debridement is: Fat layer exposed. There was a Excisional Skin/Subcutaneous Tissue Debridement with a total area of 1.68 sq cm performed by Kalman Shan, DO. With the  following instrument(s): Curette to remove Viable and Non-Viable tissue/material. Material removed includes Subcutaneous Tissue, Slough, and Fibrin/Exudate after achieving pain control using Lidocaine 5% topical ointment. A time out was conducted at 09:35, prior to the start of the procedure. A Minimum amount of bleeding was controlled with Pressure. The procedure was tolerated well with a pain level of 0 throughout and a pain level of 0 following the procedure. Post Debridement Measurements: 0.8cm length x 2.1cm width x 0.3cm depth; 0.396cm^3 volume. Character of Wound/Ulcer Post Debridement is improved. Severity of Tissue Post Debridement is: Fat layer exposed. Post procedure Diagnosis Wound #1: Same as Pre-Procedure Pre-procedure diagnosis of Wound #1 is a Venous Leg Ulcer located on the Left,Lateral Lower Leg . There was a Double Layer Compression Therapy Procedure by Brandy Pilling, RN. Post procedure Diagnosis Wound #1: Same as Pre-Procedure Plan Follow-up Appointments: Return Appointment in 1 week. - Dr. Heber [place] and [place], Room 8 06/08/2022 0900 Monday Return Appointment in 2 weeks. - Dr. Heber [place] and [place], Room 8 06/15/2022 0900 Monday Other: - Purchase compression stockings. Information provided. Anesthetic: (In clinic) Topical Lidocaine 5% applied to wound bed Bathing/  Shower/ Hygiene: May shower with protection but do not get wound dressing(s) wet. Edema Control - Lymphedema / SCD / Other: Elevate legs to the level of the heart or above for 30 minutes daily and/or when sitting, a frequency of: - 2-3 times a day throughout the day. Avoid standing for long periods of time. Exercise regularly Moisturize legs daily. - apply to right leg every night before bed. Compression stocking or Garment 20-30 mm/Hg pressure to: - Patient to purchase compression stockings. Once you purchase apply in the morning and remove at night to right leg for now. WOUND #1: - Lower Leg Wound Laterality: Left,  Lateral Topical: Gentamicin 1 x Per Week/30 Days Discharge Instructions: As directed by physician Prim Dressing: Hydrofera Blue Classic Foam, 2x2 in 1 x Per Week/30 Days ary Discharge Instructions: Moisten with saline prior to applying to wound bed Secondary Dressing: Woven Gauze Sponge, Non-Sterile 4x4 in 1 x Per Week/30 Days Discharge Instructions: Apply over primary dressing as directed. Secondary Dressing: Zetuvit Plus 4x8 in 1 x Per Week/30 Days Discharge Instructions: Apply over primary dressing as directed. Secured With: unna boot first layer 1 x Per Week/30 Days Discharge Instructions: apply first layer of unna boot to upper portion of lower leg to secure compression wrap in place. Com pression Wrap: CoFlex TLC XL 2-layer Compression System 4x7 (in/yd) 1 x Per Week/30 Days Discharge Instructions: Apply CoFlex 2-layer compression as directed. (alt for 4 layer) 1. In office sharp debridement 2. Antibiotic ointment with Hydrofera Blue under 2 layer Coflex. Electronic Signature(s) Signed: 06/01/2022 10:32:19 AM By: Kalman Shan DO Entered By: Kalman Shan on 06/01/2022 10:10:22 -------------------------------------------------------------------------------- HxROS Details Patient Name: Date of Service: [name], [place] [name] 9:00 A M Medical Record Number: [id number] Patient Account Number: [id number] Date of Birth/Sex: Treating RN: [birth year] (76 y.o. F) Primary Care Provider: [name] Other Clinician: Referring Provider: Treating Provider/Extender: Hollie Beach in Treatment: 1 Information Obtained From Patient Eyes Medical History: Positive for: Cataracts Negative for: Glaucoma Ear/Nose/Mouth/Throat Medical History: Negative for: Chronic sinus problems/congestion; Middle ear problems Hematologic/Lymphatic Medical History: Positive for: Anemia Negative for: Hemophilia; Human Immunodeficiency Virus; Lymphedema; Sickle Cell  Disease Respiratory Medical History: Negative for: Aspiration; Asthma; Chronic Obstructive Pulmonary Disease (COPD); Pneumothorax; Sleep Apnea; Tuberculosis Cardiovascular Medical History: Positive for: Congestive Heart Failure; Hypertension Negative for: Angina; Arrhythmia; Coronary Artery Disease; Deep Vein Thrombosis; Hypotension; Myocardial Infarction; Peripheral Arterial Disease; Peripheral Venous Disease; Phlebitis; Vasculitis Gastrointestinal Medical History: Negative for: Cirrhosis ; Colitis; Crohns; Hepatitis A; Hepatitis B; Hepatitis C Past Medical History Notes: diverticulosis hiatal hernia hyperlipidemia Endocrine Medical History: Negative for: Type I Diabetes; Type II Diabetes Genitourinary Medical History: Negative for: End Stage Renal Disease Immunological Medical History: Negative for: Lupus Erythematosus; Raynauds; Scleroderma Integumentary (Skin) Medical History: Negative for: History of Burn Musculoskeletal Medical History: Positive for: Osteoarthritis Negative for: Gout; Rheumatoid Arthritis; Osteomyelitis Past Medical History Notes: DJD Neurologic Medical History: Negative for: Dementia; Neuropathy; Quadriplegia; Paraplegia; Seizure Disorder Oncologic Medical History: Negative for: Received Chemotherapy Psychiatric Medical History: Negative for: Anorexia/bulimia; Confinement Anxiety HBO Extended History Items Eyes: Cataracts Immunizations Pneumococcal Vaccine: Received Pneumococcal Vaccination: Yes Received Pneumococcal Vaccination On or After 60th Birthday: Yes Implantable Devices None Hospitalization / Surgery History Type of Hospitalization/Surgery right total knee replacement Family and Social History Cancer: Yes - Siblings; Diabetes: Yes - Siblings; Heart Disease: Yes - Mother; Hereditary Spherocytosis: No; Hypertension: Yes - Mother; Kidney Disease: No; Lung Disease: No; Seizures: No; Stroke: No; Thyroid Problems: No; Tuberculosis:  No;  Never smoker; Marital Status - Married; Alcohol Use: Never; Drug Use: No History; Caffeine Use: Moderate; Financial Concerns: No; Food, Clothing or Shelter Needs: No; Support System Lacking: No; Transportation Concerns: No Electronic Signature(s) Signed: 06/01/2022 10:32:19 AM By: Kalman Shan DO Entered By: Kalman Shan on 06/01/2022 10:07:30 -------------------------------------------------------------------------------- SuperBill Details Patient Name: Date of Service: [name], [place] [name] Medical Record Number: [id number] Patient Account Number: [id number] Date of Birth/Sex: Treating RN: [birth year] (76 y.o. [name] Primary Care Provider: [name] Other Clinician: Referring Provider: Treating Provider/Extender: Hollie Beach in Treatment: 1 Diagnosis Coding ICD-10 Codes Code Description [phone] Chronic venous hypertension (idiopathic) with ulcer of left lower extremity W54.8XXA Other contact with dog, initial encounter [phone] Non-pressure chronic ulcer of other part of left lower leg with fat layer exposed E11.622 Type 2 diabetes mellitus with other skin ulcer Facility Procedures CPT4 Code: [id number] Description: 53299 - DEB SUBQ TISSUE 20 SQ CM/< ICD-10 Diagnosis Description L97.822 Non-pressure chronic ulcer of other part of left lower leg with fat layer expo Modifier: sed Quantity: 1 Physician Procedures : CPT4 Code Description Modifier [id number] 11042 - WC PHYS SUBQ TISS 20 SQ CM ICD-10 Diagnosis Description L97.822 Non-pressure chronic ulcer of other part of left lower leg with fat layer exposed Quantity: 1 Electronic Signature(s) Signed: 06/01/2022 10:32:19 AM By: Kalman Shan DO Entered By: Kalman Shan on 06/01/2022 10:10:48

## 2022-06-05 NOTE — Progress Notes (Signed)
[name], [name] ([id number]) Visit Report for 06/01/2022 Arrival Information Details Patient Name: Date of Service: [name], [place] Brandy [name] 06/01/2022 9:00 A M Medical Record Number: [id number] Patient Account Number: [id number] Date of Birth/Sex: Treating RN: June 19, 1946 (76 y.o. [name], [name] Primary Care Berma Harts: [name] Other Clinician: Referring Darrow Barreiro: Treating Lessie Manigo/Extender: Hollie Beach in Treatment: 1 Visit Information History Since Last Visit Added or deleted any medications: No Patient Arrived: [name] Any new allergies or adverse reactions: No Arrival Time: 09:08 Had a fall or experienced change in No Accompanied By: son activities of daily living that may affect Transfer Assistance: None risk of falls: Patient Identification Verified: Yes Signs or symptoms of abuse/neglect since last visito No Secondary Verification Process Completed: Yes Hospitalized since last visit: No Patient Requires Transmission-Based Precautions: No Implantable device outside of the clinic excluding No Patient Has Alerts: No cellular tissue based products placed in the center since last visit: Has Dressing in Place as Prescribed: No Has Compression in Place as Prescribed: No Pain Present Now: No Notes per patient compression wrap slid down friday, patient removed and applied stocking. Electronic Signature(s) Signed: 06/01/2022 4:35:20 PM By: [name] RN, BSN Entered By: [name] on 06/01/2022 09:11:46 -------------------------------------------------------------------------------- Compression Therapy Details Patient Name: Date of Service: [name], [place] Brandy [name] 06/01/2022 9:00 A M Medical Record Number: [id number] Patient Account Number: [id number] Date of Birth/Sex: Treating RN: November 30, 1945 (76 y.o. [name] Primary Care Roxie Gueye: [name] Other Clinician: Referring Aviyana Sonntag: Treating Carolene Gitto/Extender: Hollie Beach in Treatment: 1 Compression Therapy Performed for Wound Assessment: Wound #1 Left,Lateral Lower Leg Performed By: Clinician [name], RN Compression Type: Double Layer Post Procedure Diagnosis Same as Pre-procedure Electronic Signature(s) Signed: 06/01/2022 4:35:20 PM By: [name] RN, BSN Entered By: [name] on 06/01/2022 09:40:41 -------------------------------------------------------------------------------- Encounter Discharge Information Details Patient Name: Date of Service: [name], [place] Brandy [name] 06/01/2022 9:00 A M Medical Record Number: [id number] Patient Account Number: [id number] Date of Birth/Sex: Treating RN: July 16, 1946 (76 y.o. [name], [name] Primary Care Suri Tafolla: [name] Other Clinician: Referring Nike Southers: Treating Nikhita Mentzel/Extender: Hollie Beach in Treatment: 1 Encounter Discharge Information Items Post Procedure Vitals Discharge Condition: Stable Temperature (F): 98.3 Ambulatory Status: Walker Pulse (bpm): 80 Discharge Destination: Home Respiratory Rate (breaths/min): 20 Transportation: Private Auto Blood Pressure (mmHg): 144/82 Accompanied By: son Schedule Follow-up Appointment: Yes Clinical Summary of Care: Electronic Signature(s) Signed: 06/01/2022 4:35:20 PM By: [name] RN, BSN Entered By: [name] on 06/01/2022 09:42:34 -------------------------------------------------------------------------------- Lower Extremity Assessment Details Patient Name: Date of Service: [name], [place] Brandy [name] 06/01/2022 9:00 A M Medical Record Number: [id number] Patient Account Number: [id number] Date of Birth/Sex: Treating RN: [birth year] (76 y.o. [name] Primary Care Rahmel Nedved: [name] Other Clinician: Referring Calix Heinbaugh: Treating Draiden Mirsky/Extender: Hollie Beach in Treatment: 1 Edema Assessment Assessed: [name]: Yes] [Right: No] Edema: [Left: Yes] [Right:  Yes] Calf Left: Right: Point of Measurement: 41 cm From Medial Instep 41.5 cm Ankle Left: Right: Point of Measurement: 8 cm From Medial Instep 23 cm Vascular Assessment Pulses: Dorsalis Pedis Palpable: [Left:Yes] Electronic Signature(s) Signed: 06/01/2022 4:35:20 PM By: [name] RN, BSN Entered By: [name] on 06/01/2022 09:13:28 -------------------------------------------------------------------------------- Multi Wound Chart Details Patient Name: Date of Service: [name], [place] Brandy [name] 06/01/2022 9:00 A M Medical Record Number: [id number] Patient Account Number: [id number] Date of Birth/Sex: Treating RN: 1946/04/03 (76 y.o. F) Primary Care Roshad Hack: [name]  Other Clinician: Referring Kourtnie Sachs: Treating Hermilo Dutter/Extender: Hollie Beach in Treatment: 1 Vital Signs Height(in): 63 Pulse(bpm): 80 Weight(lbs): Blood Pressure(mmHg): 144/82 Body Mass Index(BMI): Temperature(F): 98.3 Respiratory Rate(breaths/min): 20 Photos: [N/A:N/A] Left, Lateral Lower Leg N/A N/A Wound Location: Laceration N/A N/A Wounding Event: Venous Leg Ulcer N/A N/A Primary Etiology: Lymphedema N/A N/A Secondary Etiology: Cataracts, Anemia, Congestive Heart N/A N/A Comorbid History: Failure, Hypertension, Osteoarthritis 04/24/2022 N/A N/A Date Acquired: 1 N/A N/A Weeks of Treatment: Open N/A N/A Wound Status: No N/A N/A Wound Recurrence: 0.8x2.1x0.3 N/A N/A Measurements L x W x D (cm) 1.319 N/A N/A A (cm) : rea 0.396 N/A N/A Volume (cm) : 20.00% N/A N/A % Reduction in A rea: 20.00% N/A N/A % Reduction in Volume: Full Thickness Without Exposed N/A N/A Classification: Support Structures Medium N/A N/A Exudate A mount: Serosanguineous N/A N/A Exudate Type: red, brown N/A N/A Exudate Color: Distinct, outline attached N/A N/A Wound Margin: Medium (34-66%) N/A N/A Granulation A mount: Red N/A N/A Granulation Quality: Medium (34-66%) N/A  N/A Necrotic A mount: Fat Layer (Subcutaneous Tissue): Yes N/A N/A Exposed Structures: Fascia: No Tendon: No Muscle: No Joint: No Bone: No Small (1-33%) N/A N/A Epithelialization: Debridement - Excisional N/A N/A Debridement: Pre-procedure Verification/Time Out 09:35 N/A N/A Taken: Lidocaine 5% topical ointment N/A N/A Pain Control: Subcutaneous, Slough N/A N/A Tissue Debrided: Skin/Subcutaneous Tissue N/A N/A Level: 1.68 N/A N/A Debridement A (sq cm): rea Curette N/A N/A Instrument: Minimum N/A N/A Bleeding: Pressure N/A N/A Hemostasis A chieved: 0 N/A N/A Procedural Pain: 0 N/A N/A Post Procedural Pain: Procedure was tolerated well N/A N/A Debridement Treatment Response: 0.8x2.1x0.3 N/A N/A Post Debridement Measurements L x W x D (cm) 0.396 N/A N/A Post Debridement Volume: (cm) Compression Therapy N/A N/A Procedures Performed: Debridement Treatment Notes Wound #1 (Lower Leg) Wound Laterality: Left, Lateral Cleanser Peri-Wound Care Topical Gentamicin Discharge Instruction: As directed by physician Primary Dressing Hydrofera Blue Classic Foam, 2x2 in Discharge Instruction: Moisten with saline prior to applying to wound bed Secondary Dressing Woven Gauze Sponge, Non-Sterile 4x4 in Discharge Instruction: Apply over primary dressing as directed. Zetuvit Plus 4x8 in Discharge Instruction: Apply over primary dressing as directed. Secured With [employer] first layer Discharge Instruction: apply first layer of unna boot to upper portion of lower leg to secure compression wrap in place. Compression Wrap CoFlex TLC XL 2-layer Compression System 4x7 (in/yd) Discharge Instruction: Apply CoFlex 2-layer compression as directed. (alt for 4 layer) Compression Stockings Add-Ons Electronic Signature(s) Signed: 06/01/2022 10:32:19 AM By: Kalman Shan DO Entered By: Kalman Shan on 06/01/2022  10:06:06 -------------------------------------------------------------------------------- Multi-Disciplinary Care Plan Details Patient Name: Date of Service: [name], [place] Brandy [name] 06/01/2022 9:00 A M Medical Record Number: [id number] Patient Account Number: [id number] Date of Birth/Sex: Treating RN: 08-Oct-1946 (76 y.o. [name], [name] Primary Care Manna Gose: [name] Other Clinician: Referring Shamar Engelmann: Treating Angela Platner/Extender: Hollie Beach in Treatment: 1 Active Inactive Pain, Acute or Chronic Nursing Diagnoses: Pain, acute or chronic: actual or potential Potential alteration in comfort, pain Goals: Patient will verbalize adequate pain control and receive pain control interventions during procedures as needed Date Initiated: 05/25/2022 Target Resolution Date: 06/26/2022 Goal Status: Active Patient/caregiver will verbalize comfort level met Date Initiated: 05/25/2022 Target Resolution Date: 06/26/2022 Goal Status: Active Interventions: Complete pain assessment as per visit requirements Encourage patient to take pain medications as prescribed Provide education on pain management Treatment Activities: Administer pain control measures as ordered : 05/25/2022 Notes: Venous Leg Ulcer Nursing Diagnoses:  Knowledge deficit related to disease process and management Goals: Non-invasive venous studies are completed as ordered Date Initiated: 05/25/2022 Target Resolution Date: 06/26/2022 Goal Status: Active Interventions: Assess peripheral edema status every visit. Provide education on venous insufficiency Treatment Activities: Non-invasive vascular studies : 05/25/2022 Notes: Wound/Skin Impairment Nursing Diagnoses: Knowledge deficit related to ulceration/compromised skin integrity Goals: Patient/caregiver will verbalize understanding of skin care regimen Date Initiated: 05/25/2022 Target Resolution Date: 06/26/2022 Goal Status: Active Ulcer/skin breakdown will  heal within 14 weeks Date Initiated: 05/25/2022 Target Resolution Date: 08/21/2022 Goal Status: Active Interventions: Assess patient/caregiver ability to obtain necessary supplies Assess patient/caregiver ability to perform ulcer/skin care regimen upon admission and as needed Provide education on ulcer and skin care Treatment Activities: Skin care regimen initiated : 05/25/2022 Topical wound management initiated : 05/25/2022 Notes: Electronic Signature(s) Signed: 06/01/2022 4:35:20 PM By: [name] RN, BSN Entered By: [name] on 06/01/2022 09:29:10 -------------------------------------------------------------------------------- Pain Assessment Details Patient Name: Date of Service: [name], [place] Brandy [name] 06/01/2022 9:00 A M Medical Record Number: [id number] Patient Account Number: [id number] Date of Birth/Sex: Treating RN: 24-Apr-1946 (76 y.o. [name] Primary Care Haili Donofrio: [name] Other Clinician: Referring Chassity Ludke: Treating Eunique Balik/Extender: Hollie Beach in Treatment: 1 Active Problems Location of Pain Severity and Description of Pain Patient Has Paino No Site Locations Rate the pain. Current Pain Level: 0 Pain Management and Medication Current Pain Management: Medication: No Cold Application: No Rest: No Massage: No Activity: No T.E.N.S.: No Heat Application: No Leg drop or elevation: No Is the Current Pain Management Adequate: Adequate How does your wound impact your activities of daily livingo Sleep: No Bathing: No Appetite: No Relationship With Others: No Bladder Continence: No Emotions: No Bowel Continence: No Work: No Toileting: No Drive: No Dressing: No Hobbies: No Engineer, maintenance) Signed: 06/01/2022 4:35:20 PM By: [name] RN, BSN Entered By: [name] on 06/01/2022 09:12:16 -------------------------------------------------------------------------------- Patient/Caregiver Education  Details Patient Name: Date of Service: [name] Brandy [name] 8/14/2023andnbsp9:00 A M Medical Record Number: [id number] Patient Account Number: [id number] Date of Birth/Gender: Treating RN: 22-May-1946 (76 y.o. [name] Primary Care Physician: [name] Other Clinician: Referring Physician: Treating Physician/Extender: Hollie Beach in Treatment: 1 Education Assessment Education Provided To: Patient Education Topics Provided Wound/Skin Impairment: Handouts: Skin Care Do's and Dont's Methods: Explain/Verbal Responses: Reinforcements needed Electronic Signature(s) Signed: 06/01/2022 4:35:20 PM By: [name] RN, BSN Entered By: [name] on 06/01/2022 09:29:22 -------------------------------------------------------------------------------- Wound Assessment Details Patient Name: Date of Service: [name], [place] Brandy [name] 06/01/2022 9:00 A M Medical Record Number: [id number] Patient Account Number: [id number] Date of Birth/Sex: Treating RN: [birth year] (76 y.o. [name], [name] Primary Care Tracee Mccreery: [name] Other Clinician: Referring Delynn Pursley: Treating Emerlyn Mehlhoff/Extender: Hollie Beach in Treatment: 1 Wound Status Wound Number: 1 Primary Venous Leg Ulcer Etiology: Wound Location: Left, Lateral Lower Leg Secondary Lymphedema Wounding Event: Laceration Etiology: Date Acquired: 04/24/2022 Wound Status: Open Weeks Of Treatment: 1 Comorbid Cataracts, Anemia, Congestive Heart Failure, Hypertension, Clustered Wound: No History: Osteoarthritis Photos Wound Measurements Length: (cm) 0.8 Width: (cm) 2.1 Depth: (cm) 0.3 Area: (cm) 1.319 Volume: (cm) 0.396 % Reduction in Area: 20% % Reduction in Volume: 20% Epithelialization: Small (1-33%) Tunneling: No Undermining: No Wound Description Classification: Full Thickness Without Exposed Support Structures Wound Margin: Distinct, outline attached Exudate Amount:  Medium Exudate Type: Serosanguineous Exudate Color: red, brown Foul Odor After Cleansing: No Slough/Fibrino Yes Wound Bed Granulation Amount: Medium (34-66%) Exposed Structure Granulation  Quality: Red Fascia Exposed: No Necrotic Amount: Medium (34-66%) Fat Layer (Subcutaneous Tissue) Exposed: Yes Necrotic Quality: Adherent Slough Tendon Exposed: No Muscle Exposed: No Joint Exposed: No Bone Exposed: No Treatment Notes Wound #1 (Lower Leg) Wound Laterality: Left, Lateral Cleanser Peri-Wound Care Topical Gentamicin Discharge Instruction: As directed by physician Primary Dressing Hydrofera Blue Classic Foam, 2x2 in Discharge Instruction: Moisten with saline prior to applying to wound bed Secondary Dressing Woven Gauze Sponge, Non-Sterile 4x4 in Discharge Instruction: Apply over primary dressing as directed. Zetuvit Plus 4x8 in Discharge Instruction: Apply over primary dressing as directed. Secured With [employer] first layer Discharge Instruction: apply first layer of unna boot to upper portion of lower leg to secure compression wrap in place. Compression Wrap CoFlex TLC XL 2-layer Compression System 4x7 (in/yd) Discharge Instruction: Apply CoFlex 2-layer compression as directed. (alt for 4 layer) Compression Stockings Add-Ons Electronic Signature(s) Signed: 06/01/2022 4:24:40 PM By: Rhae Hammock RN Signed: 06/01/2022 4:35:20 PM By: [name] RN, BSN Entered By: Rhae Hammock on 06/01/2022 09:17:11 -------------------------------------------------------------------------------- Vitals Details Patient Name: Date of Service: [name], [place] Brandy [name] 06/01/2022 9:00 A M Medical Record Number: [id number] Patient Account Number: [id number] Date of Birth/Sex: Treating RN: 03-Jun-1946 (76 y.o. [name], [name] Primary Care Yan Okray: [name] Other Clinician: Referring Aryana Wonnacott: Treating Elfa Wooton/Extender: Hollie Beach in Treatment: 1 Vital  Signs Time Taken: 09:08 Temperature (F): 98.3 Height (in): 63 Pulse (bpm): 80 Respiratory Rate (breaths/min): 20 Blood Pressure (mmHg): 144/82 Reference Range: 80 - 120 mg / dl Electronic Signature(s) Signed: 06/01/2022 4:35:20 PM By: [name] RN, BSN Entered By: [name] on 06/01/2022 09:11:59

## 2022-06-08 ENCOUNTER — Encounter (HOSPITAL_BASED_OUTPATIENT_CLINIC_OR_DEPARTMENT_OTHER): Payer: Medicare HMO | Admitting: Internal Medicine

## 2022-06-08 DIAGNOSIS — I872 Venous insufficiency (chronic) (peripheral): Secondary | ICD-10-CM | POA: Diagnosis not present

## 2022-06-08 DIAGNOSIS — E1122 Type 2 diabetes mellitus with diabetic chronic kidney disease: Secondary | ICD-10-CM | POA: Diagnosis not present

## 2022-06-08 DIAGNOSIS — I509 Heart failure, unspecified: Secondary | ICD-10-CM | POA: Diagnosis not present

## 2022-06-08 DIAGNOSIS — E11622 Type 2 diabetes mellitus with other skin ulcer: Secondary | ICD-10-CM | POA: Diagnosis not present

## 2022-06-08 DIAGNOSIS — N183 Chronic kidney disease, stage 3 unspecified: Secondary | ICD-10-CM | POA: Diagnosis not present

## 2022-06-08 DIAGNOSIS — I130 Hypertensive heart and chronic kidney disease with heart failure and stage 1 through stage 4 chronic kidney disease, or unspecified chronic kidney disease: Secondary | ICD-10-CM | POA: Diagnosis not present

## 2022-06-08 DIAGNOSIS — L97822 Non-pressure chronic ulcer of other part of left lower leg with fat layer exposed: Secondary | ICD-10-CM | POA: Diagnosis not present

## 2022-06-08 NOTE — Progress Notes (Signed)
ANEL, CREIGHTON ([id number]) Visit Report for 06/08/2022 Arrival Information Details Patient Name: Date of Service: [name] Brandy B. 06/08/2022 9:00 A M Medical Record Number: [id number] Patient Account Number: [id number] Date of Birth/Sex: Treating RN: [birth year] (76 y.o. F) Primary Care Lutisha Knoche: [name] Other Clinician: Referring Joandry Slagter: Treating Joaquim Tolen/Extender: Hollie Beach in Treatment: 2 Visit Information History Since Last Visit Added or deleted any medications: No Patient Arrived: [name] Any new allergies or adverse reactions: No Arrival Time: 08:53 Had a fall or experienced change in No Accompanied By: son activities of daily living that may affect Transfer Assistance: None risk of falls: Patient Identification Verified: Yes Signs or symptoms of abuse/neglect since last visito No Secondary Verification Process Completed: Yes Hospitalized since last visit: No Patient Requires Transmission-Based Precautions: No Implantable device outside of the clinic excluding No Patient Has Alerts: No cellular tissue based products placed in the center since last visit: Has Compression in Place as Prescribed: Yes Pain Present Now: No Electronic Signature(s) Signed: 06/08/2022 4:55:34 PM By: [name] Entered By: [name] on 06/08/2022 08:54:24 -------------------------------------------------------------------------------- Compression Therapy Details Patient Name: Date of Service: [name], CA RO Brandy B. 06/08/2022 9:00 A M Medical Record Number: [id number] Patient Account Number: [id number] Date of Birth/Sex: Treating RN: 01-May-1946 (76 y.o. [name] Primary Care [name]: [name] Other Clinician: Referring Neizan Debruhl: Treating Senica Crall/Extender: Hollie Beach in Treatment: 2 Compression Therapy Performed for Wound Assessment: Wound #1 Left,Lateral Lower Leg Performed By: Clinician [name],  RN Compression Type: Double Layer Post Procedure Diagnosis Same as Pre-procedure Electronic Signature(s) Signed: 06/08/2022 4:49:28 PM By: [name] RN, BSN Entered By: [name] on 06/08/2022 09:27:07 -------------------------------------------------------------------------------- Encounter Discharge Information Details Patient Name: Date of Service: [name], CA RO Brandy B. 06/08/2022 9:00 A M Medical Record Number: [id number] Patient Account Number: [id number] Date of Birth/Sex: Treating RN: [birth year] (77 y.o. [name], [name] Primary Care [name]: [name] Other Clinician: Referring [name]: Treating [name]/Extender: Hollie Beach in Treatment: 2 Encounter Discharge Information Items Post Procedure Vitals Discharge Condition: Stable Temperature (F): 98.2 Ambulatory Status: Walker Pulse (bpm): 76 Discharge Destination: Home Respiratory Rate (breaths/min): 20 Transportation: Private Auto Blood Pressure (mmHg): 134/83 Accompanied By: son Schedule Follow-up Appointment: Yes Clinical Summary of Care: Electronic Signature(s) Signed: 06/08/2022 4:49:28 PM By: [name] RN, BSN Entered By: [name] on 06/08/2022 09:29:37 -------------------------------------------------------------------------------- Lower Extremity Assessment Details Patient Name: Date of Service: [name] RO Brandy B. 06/08/2022 9:00 A M Medical Record Number: [id number] Patient Account Number: [id number] Date of Birth/Sex: Treating RN: 1945-11-22 (76 y.o. F) Primary Care Cartrell Bentsen: [name] Other Clinician: Referring [name]: Treating [name]/Extender: Hollie Beach in Treatment: 2 Edema Assessment Assessed: [Left: No] [Right: No] Edema: [Left: Yes] [Right: Yes] Calf Left: Right: Point of Measurement: 41 cm From Medial Instep 47 cm Ankle Left: Right: Point of Measurement: 8 cm From Medial Instep 23 cm Electronic  Signature(s) Signed: 06/08/2022 4:55:34 PM By: [name] Entered By: [name] on 06/08/2022 09:02:26 -------------------------------------------------------------------------------- Multi Wound Chart Details Patient Name: Date of Service: [name], CA RO Brandy B. 06/08/2022 9:00 A M Medical Record Number: [id number] Patient Account Number: [id number] Date of Birth/Sex: Treating RN: [birth year] (76 y.o. F) Primary Care [name]: [name] Other Clinician: Referring Tariyah Pendry: Treating [name]/Extender: Hollie Beach in Treatment: 2 Vital Signs Height(in): 63 Pulse(bpm): 76 Weight(lbs): Blood Pressure(mmHg): 134/83 Body Mass Index(BMI): Temperature(F): 98.2 Respiratory Rate(breaths/min): 18 Photos: [N/A:N/A] Left,  Lateral Lower Leg N/A N/A Wound Location: Laceration N/A N/A Wounding Event: Venous Leg Ulcer N/A N/A Primary Etiology: Lymphedema N/A N/A Secondary Etiology: Cataracts, Anemia, Congestive Heart N/A N/A Comorbid History: Failure, Hypertension, Osteoarthritis 04/24/2022 N/A N/A Date Acquired: 2 N/A N/A Weeks of Treatment: Open N/A N/A Wound Status: No N/A N/A Wound Recurrence: 0.7x2.1x0.3 N/A N/A Measurements L x W x D (cm) 1.155 N/A N/A A (cm) : rea 0.346 N/A N/A Volume (cm) : 30.00% N/A N/A % Reduction in A rea: 30.10% N/A N/A % Reduction in Volume: Full Thickness Without Exposed N/A N/A Classification: Support Structures Medium N/A N/A Exudate A mount: Serosanguineous N/A N/A Exudate Type: red, brown N/A N/A Exudate Color: Distinct, outline attached N/A N/A Wound Margin: Medium (34-66%) N/A N/A Granulation A mount: Red N/A N/A Granulation Quality: Medium (34-66%) N/A N/A Necrotic A mount: Fat Layer (Subcutaneous Tissue): Yes N/A N/A Exposed Structures: Fascia: No Tendon: No Muscle: No Joint: No Bone: No Small (1-33%) N/A N/A Epithelialization: Debridement - Excisional N/A  N/A Debridement: Pre-procedure Verification/Time Out 09:20 N/A N/A Taken: Lidocaine 5% topical ointment N/A N/A Pain Control: Subcutaneous, Slough N/A N/A Tissue Debrided: Skin/Subcutaneous Tissue N/A N/A Level: 1.47 N/A N/A Debridement A (sq cm): rea Curette N/A N/A Instrument: Minimum N/A N/A Bleeding: Pressure N/A N/A Hemostasis A chieved: 0 N/A N/A Procedural Pain: 0 N/A N/A Post Procedural Pain: Procedure was tolerated well N/A N/A Debridement Treatment Response: 0.7x2.1x0.3 N/A N/A Post Debridement Measurements L x W x D (cm) 0.346 N/A N/A Post Debridement Volume: (cm) Compression Therapy N/A N/A Procedures Performed: Debridement Treatment Notes Wound #1 (Lower Leg) Wound Laterality: Left, Lateral Cleanser Peri-Wound Care Sween Lotion (Moisturizing lotion) Discharge Instruction: Apply moisturizing lotion as directed Topical Primary Dressing Hydrofera Blue Classic Foam, 2x2 in Discharge Instruction: Moisten with saline prior to applying to wound bed Santyl Ointment Discharge Instruction: Apply nickel thick amount to wound bed as instructed Secondary Dressing Woven Gauze Sponge, Non-Sterile 4x4 in Discharge Instruction: Apply over primary dressing as directed. Zetuvit Plus 4x8 in Discharge Instruction: Apply over primary dressing as directed. Secured With [employer] first layer Discharge Instruction: apply first layer of unna boot to upper portion of lower leg to secure compression wrap in place. Compression Wrap CoFlex TLC XL 2-layer Compression System 4x7 (in/yd) Discharge Instruction: Apply CoFlex 2-layer compression as directed. (alt for 4 layer) Compression Stockings Add-Ons Electronic Signature(s) Signed: 06/08/2022 2:26:36 PM By: Kalman Shan DO Entered By: Kalman Shan on 06/08/2022 12:54:42 -------------------------------------------------------------------------------- Multi-Disciplinary Care Plan Details Patient Name: Date of  Service: [name], CA RO Brandy B. 06/08/2022 9:00 A M Medical Record Number: [id number] Patient Account Number: [id number] Date of Birth/Sex: Treating RN: 07-Nov-1945 (76 y.o. [name], [name] Primary Care [place]: [name] Other Clinician: Referring Jarmon Javid: Treating Ameliya Nicotra/Extender: Hollie Beach in Treatment: 2 Active Inactive Pain, Acute or Chronic Nursing Diagnoses: Pain, acute or chronic: actual or potential Potential alteration in comfort, pain Goals: Patient will verbalize adequate pain control and receive pain control interventions during procedures as needed Date Initiated: 05/25/2022 Target Resolution Date: 06/26/2022 Goal Status: Active Patient/caregiver will verbalize comfort level met Date Initiated: 05/25/2022 Target Resolution Date: 06/26/2022 Goal Status: Active Interventions: Complete pain assessment as per visit requirements Encourage patient to take pain medications as prescribed Provide education on pain management Treatment Activities: Administer pain control measures as ordered : 05/25/2022 Notes: Venous Leg Ulcer Nursing Diagnoses: Knowledge deficit related to disease process and management Goals: Non-invasive venous studies are completed as ordered Date Initiated:  05/25/2022 Target Resolution Date: 06/26/2022 Goal Status: Active Interventions: Assess peripheral edema status every visit. Provide education on venous insufficiency Treatment Activities: Non-invasive vascular studies : 05/25/2022 Notes: Wound/Skin Impairment Nursing Diagnoses: Knowledge deficit related to ulceration/compromised skin integrity Goals: Patient/caregiver will verbalize understanding of skin care regimen Date Initiated: 05/25/2022 Target Resolution Date: 06/26/2022 Goal Status: Active Ulcer/skin breakdown will heal within 14 weeks Date Initiated: 05/25/2022 Target Resolution Date: 08/21/2022 Goal Status: Active Interventions: Assess patient/caregiver ability to  obtain necessary supplies Assess patient/caregiver ability to perform ulcer/skin care regimen upon admission and as needed Provide education on ulcer and skin care Treatment Activities: Skin care regimen initiated : 05/25/2022 Topical wound management initiated : 05/25/2022 Notes: Electronic Signature(s) Signed: 06/08/2022 4:49:28 PM By: [name] RN, BSN Entered By: [name] on 06/08/2022 09:00:17 -------------------------------------------------------------------------------- Pain Assessment Details Patient Name: Date of Service: [name], CA RO Brandy B. 06/08/2022 9:00 A M Medical Record Number: [id number] Patient Account Number: [id number] Date of Birth/Sex: Treating RN: Mar 30, 1946 (76 y.o. F) Primary Care Alayla Dethlefs: [name] Other Clinician: Referring Mena Simonis: Treating Soo Steelman/Extender: Hollie Beach in Treatment: 2 Active Problems Location of Pain Severity and Description of Pain Patient Has Paino No Site Locations Pain Management and Medication Current Pain Management: Electronic Signature(s) Signed: 06/08/2022 4:55:34 PM By: [name] Entered By: [name] on 06/08/2022 08:56:16 -------------------------------------------------------------------------------- Patient/Caregiver Education Details Patient Name: Date of Service: [name] RO Brandy B. 8/21/2023andnbsp9:00 A M Medical Record Number: [id number] Patient Account Number: [id number] Date of Birth/Gender: Treating RN: [birth year] (76 y.o. [name] Primary Care Physician: [name] Other Clinician: Referring Physician: Treating Physician/Extender: Hollie Beach in Treatment: 2 Education Assessment Education Provided To: Patient Education Topics Provided Wound/Skin Impairment: Handouts: Skin Care Do's and Dont's Methods: Explain/Verbal Responses: Reinforcements needed Electronic Signature(s) Signed: 06/08/2022 4:49:28 PM By: [name]  RN, BSN Entered By: [name] on 06/08/2022 09:00:26 -------------------------------------------------------------------------------- Wound Assessment Details Patient Name: Date of Service: [name], CA RO Brandy B. 06/08/2022 9:00 A M Medical Record Number: [id number] Patient Account Number: [id number] Date of Birth/Sex: Treating RN: 25-Apr-1946 (76 y.o. F) Primary Care Nikola Marone: [name] Other Clinician: Referring Caleyah Jr: Treating Junie Avilla/Extender: Hollie Beach in Treatment: 2 Wound Status Wound Number: 1 Primary Venous Leg Ulcer Etiology: Wound Location: Left, Lateral Lower Leg Secondary Lymphedema Wounding Event: Laceration Etiology: Date Acquired: 04/24/2022 Wound Status: Open Weeks Of Treatment: 2 Comorbid Cataracts, Anemia, Congestive Heart Failure, Hypertension, Clustered Wound: No History: Osteoarthritis Photos Wound Measurements Length: (cm) 0.7 Width: (cm) 2.1 Depth: (cm) 0.3 Area: (cm) 1.155 Volume: (cm) 0.346 % Reduction in Area: 30% % Reduction in Volume: 30.1% Epithelialization: Small (1-33%) Tunneling: No Undermining: No Wound Description Classification: Full Thickness Without Exposed Support Structures Wound Margin: Distinct, outline attached Exudate Amount: Medium Exudate Type: Serosanguineous Exudate Color: red, brown Foul Odor After Cleansing: No Slough/Fibrino Yes Wound Bed Granulation Amount: Medium (34-66%) Exposed Structure Granulation Quality: Red Fascia Exposed: No Necrotic Amount: Medium (34-66%) Fat Layer (Subcutaneous Tissue) Exposed: Yes Necrotic Quality: Adherent Slough Tendon Exposed: No Muscle Exposed: No Joint Exposed: No Bone Exposed: No Treatment Notes Wound #1 (Lower Leg) Wound Laterality: Left, Lateral Cleanser Peri-Wound Care Sween Lotion (Moisturizing lotion) Discharge Instruction: Apply moisturizing lotion as directed Topical Primary Dressing Hydrofera Blue Classic Foam, 2x2  in Discharge Instruction: Moisten with saline prior to applying to wound bed Santyl Ointment Discharge Instruction: Apply nickel thick amount to wound bed as instructed Secondary Dressing Woven Gauze Sponge, Non-Sterile 4x4 in Discharge  Instruction: Apply over primary dressing as directed. Zetuvit Plus 4x8 in Discharge Instruction: Apply over primary dressing as directed. Secured With [employer] first layer Discharge Instruction: apply first layer of unna boot to upper portion of lower leg to secure compression wrap in place. Compression Wrap CoFlex TLC XL 2-layer Compression System 4x7 (in/yd) Discharge Instruction: Apply CoFlex 2-layer compression as directed. (alt for 4 layer) Compression Stockings Add-Ons Electronic Signature(s) Signed: 06/08/2022 4:55:34 PM By: [name] Entered By: [name] on 06/08/2022 09:04:10 -------------------------------------------------------------------------------- Vitals Details Patient Name: Date of Service: [name], CA RO Brandy B. 06/08/2022 9:00 A M Medical Record Number: [id number] Patient Account Number: [id number] Date of Birth/Sex: Treating RN: 1946/09/09 (76 y.o. F) Primary Care Latona Krichbaum: [name] Other Clinician: Referring Aarin Sparkman: Treating Wynter Isaacs/Extender: Hollie Beach in Treatment: 2 Vital Signs Time Taken: 08:54 Temperature (F): 98.2 Height (in): 63 Pulse (bpm): 76 Respiratory Rate (breaths/min): 18 Blood Pressure (mmHg): 134/83 Reference Range: 80 - 120 mg / dl Electronic Signature(s) Signed: 06/08/2022 4:55:34 PM By: [name] Entered By: [name] on 06/08/2022 08:56:10

## 2022-06-08 NOTE — Progress Notes (Signed)
[name] ([id number]) Visit Report for 06/08/2022 Chief Complaint Document Details Patient Name: Date of Service: Brandy Corolla [name] 06/08/2022 9:00 A M Medical Record Number: [id number] Patient Account Number: [id number] Date of Birth/Sex: Treating RN: [birth year] (76 y.o. F) Primary Care Provider: [name] Other Clinician: Referring Provider: Treating Provider/Extender: Hollie Beach in Treatment: 2 Information Obtained from: Patient Chief Complaint 05/25/2022; left lower extremity wound following a dog scratch Electronic Signature(s) Signed: 06/08/2022 2:26:36 PM By: Kalman Shan DO Entered By: Kalman Shan on 06/08/2022 12:55:12 -------------------------------------------------------------------------------- Debridement Details Patient Name: Date of Service: [name], Brandy RO [name] 06/08/2022 9:00 A M Medical Record Number: [id number] Patient Account Number: [id number] Date of Birth/Sex: Treating RN: 15-Feb-1946 (76 y.o. [name], [name] Primary Care Provider: [name] Other Clinician: Referring Provider: Treating Provider/Extender: Hollie Beach in Treatment: 2 Debridement Performed for Assessment: Wound #1 Left,Lateral Lower Leg Performed By: Physician Kalman Shan, DO Debridement Type: Debridement Severity of Tissue Pre Debridement: Fat layer exposed Level of Consciousness (Pre-procedure): Awake and Alert Pre-procedure Verification/Time Out Yes - 09:20 Taken: Start Time: 09:21 Pain Control: Lidocaine 5% topical ointment T Area Debrided (L x W): otal 0.7 (cm) x 2.1 (cm) = 1.47 (cm) Tissue and other material debrided: Viable, Non-Viable, Slough, Subcutaneous, Skin: Dermis , Skin: Epidermis, Slough Level: Skin/Subcutaneous Tissue Debridement Description: Excisional Instrument: Curette Bleeding: Minimum Hemostasis Achieved: Pressure End Time: 09:26 Procedural Pain: 0 Post Procedural Pain: 0 Response to  Treatment: Procedure was tolerated well Level of Consciousness (Post- Awake and Alert procedure): Post Debridement Measurements of Total Wound Length: (cm) 0.7 Width: (cm) 2.1 Depth: (cm) 0.3 Volume: (cm) 0.346 Character of Wound/Ulcer Post Debridement: Improved Severity of Tissue Post Debridement: Fat layer exposed Post Procedure Diagnosis Same as Pre-procedure Electronic Signature(s) Signed: 06/08/2022 2:26:36 PM By: Kalman Shan DO Signed: 06/08/2022 4:49:28 PM By: [name] RN, BSN Entered By: [name] on 06/08/2022 09:26:29 -------------------------------------------------------------------------------- HPI Details Patient Name: Date of Service: [name], Brandy RO [name] 06/08/2022 9:00 A M Medical Record Number: [id number] Patient Account Number: [id number] Date of Birth/Sex: Treating RN: [birth year] (76 y.o. F) Primary Care Provider: [name] Other Clinician: Referring Provider: Treating Provider/Extender: Hollie Beach in Treatment: 2 History of Present Illness HPI Description: Admission 05/25/2022 [name] is a 76 year old female with a past medical history of diet-controlled type 2 diabetes, chronic 3 stage kidney disease and venous insufficiency that presents to the clinic for a 1 month history of nonhealing ulcer to the left leg. She states that her dog scratched her causing a wound. She has developed cellulitis in this leg and has been treated with clindamycin by her primary care physician. She reports improvement in her symptoms. She currently denies signs of infection. She has been keeping the area covered. She does not wear compression stockings. She is on 80 mg of Lasix twice daily. She has had reflux studies to the left leg on 07/2021 that notes venous reflux throughout the left common femoral vein and greater saphenous vein. She has not had an ablation. She follows with vein and vascular for her venous insufficiency. 8/14;  patient presents for follow-up. She states that the wrap stayed on for 4 days and eventually slid down. She has been wearing her compression stocking and doing dressing changes with Hydrofera Blue since. She also states she has a hard time putting on her shoes with the 3 layer compression. She denies signs of infection. 8/21; patient presents  for follow-up. She again had trouble with the wrap sliding down. We have been using Hydrofera Blue with gentamicin under 2 layer Coflex. She currently denies signs of infection. Electronic Signature(s) Signed: 06/08/2022 2:26:36 PM By: Kalman Shan DO Entered By: Kalman Shan on 06/08/2022 12:56:08 -------------------------------------------------------------------------------- Physical Exam Details Patient Name: Date of Service: [name] RO [name] 06/08/2022 9:00 A M Medical Record Number: [id number] Patient Account Number: [id number] Date of Birth/Sex: Treating RN: [birth year] (76 y.o. F) Primary Care Provider: [name] Other Clinician: Referring Provider: Treating Provider/Extender: Hollie Beach in Treatment: 2 Constitutional respirations regular, non-labored and within target range for patient.. Cardiovascular 2+ dorsalis pedis/posterior tibialis pulses. Psychiatric pleasant and cooperative. Notes Left lower extremity: T the lateral aspect there is an open wound with granulation tissue and nonviable tissue. No surrounding signs of infection including o increased warmth, erythema or purulent drainage. 2+ pitting edema to the knee. Electronic Signature(s) Signed: 06/08/2022 2:26:36 PM By: Kalman Shan DO Entered By: Kalman Shan on 06/08/2022 12:56:30 -------------------------------------------------------------------------------- Physician Orders Details Patient Name: Date of Service: [name], Brandy RO [name] 06/08/2022 9:00 A M Medical Record Number: [id number] Patient Account Number: [id number] Date of  Birth/Sex: Treating RN: May 27, 1946 (76 y.o. [name] Primary Care Provider: [name] Other Clinician: Referring Provider: Treating Provider/Extender: Hollie Beach in Treatment: 2 Verbal / Phone Orders: No Diagnosis Coding ICD-10 Coding Code Description [phone] Chronic venous hypertension (idiopathic) with ulcer of left lower extremity W54.8XXA Other contact with dog, initial encounter [phone] Non-pressure chronic ulcer of other part of left lower leg with fat layer exposed E11.622 Type 2 diabetes mellitus with other skin ulcer Follow-up Appointments ppointment in 1 week. - Dr. Heber [place] and [place], Room 8 06/15/2022 0900 Monday Return A ppointment in 2 weeks. - Dr. Heber [place] and [place], Room 8 06/23/2022 0900 Tuesday Return A Other: - bring in stockings weekly in case wound closure. Anesthetic (In clinic) Topical Lidocaine 5% applied to wound bed Bathing/ Shower/ Hygiene May shower with protection but do not get wound dressing(s) wet. Edema Control - Lymphedema / SCD / Other Elevate legs to the level of the heart or above for 30 minutes daily and/or when sitting, a frequency of: - 2-3 times a day throughout the day. Avoid standing for long periods of time. Exercise regularly Moisturize legs daily. - apply to right leg every night before bed. Compression stocking or Garment 20-30 mm/Hg pressure to: - Patient to purchase compression stockings. Once you purchase apply in the morning and remove at night to right leg for now. Wound Treatment Wound #1 - Lower Leg Wound Laterality: Left, Lateral Peri-Wound Care: Sween Lotion (Moisturizing lotion) 1 x Per Week/30 Days Discharge Instructions: Apply moisturizing lotion as directed Prim Dressing: Hydrofera Blue Classic Foam, 2x2 in 1 x Per Week/30 Days ary Discharge Instructions: Moisten with saline prior to applying to wound bed Prim Dressing: Santyl Ointment 1 x Per Week/30 Days ary Discharge Instructions: Apply  nickel thick amount to wound bed as instructed Secondary Dressing: Woven Gauze Sponge, Non-Sterile 4x4 in 1 x Per Week/30 Days Discharge Instructions: Apply over primary dressing as directed. Secondary Dressing: Zetuvit Plus 4x8 in 1 x Per Week/30 Days Discharge Instructions: Apply over primary dressing as directed. Secured With: unna boot first layer 1 x Per Week/30 Days Discharge Instructions: apply first layer of unna boot to upper portion of lower leg to secure compression wrap in place. Compression Wrap: CoFlex TLC XL 2-layer Compression System 4x7 (in/yd) 1  x Per Week/30 Days Discharge Instructions: Apply CoFlex 2-layer compression as directed. (alt for 4 layer) Electronic Signature(s) Signed: 06/08/2022 2:26:36 PM By: Kalman Shan DO Entered By: Kalman Shan on 06/08/2022 12:56:37 -------------------------------------------------------------------------------- Problem List Details Patient Name: Date of Service: [name], Brandy RO [name] 06/08/2022 9:00 A M Medical Record Number: [id number] Patient Account Number: [id number] Date of Birth/Sex: Treating RN: 1945/11/10 (76 y.o. [name] Primary Care Provider: [name] Other Clinician: Referring Provider: Treating Provider/Extender: Hollie Beach in Treatment: 2 Active Problems ICD-10 Encounter Code Description Active Date MDM Diagnosis I87.312 Chronic venous hypertension (idiopathic) with ulcer of left lower extremity 05/25/2022 No Yes W54.8XXA Other contact with dog, initial encounter 05/25/2022 No Yes L97.822 Non-pressure chronic ulcer of other part of left lower leg with fat layer 05/25/2022 No Yes exposed E11.622 Type 2 diabetes mellitus with other skin ulcer 05/25/2022 No Yes Inactive Problems Resolved Problems Electronic Signature(s) Signed: 06/08/2022 2:26:36 PM By: Kalman Shan DO Entered By: Kalman Shan on 06/08/2022  12:54:38 -------------------------------------------------------------------------------- Progress Note Details Patient Name: Date of Service: [name], Brandy RO [name] 06/08/2022 9:00 A M Medical Record Number: [id number] Patient Account Number: [id number] Date of Birth/Sex: Treating RN: 1946/01/25 (76 y.o. F) Primary Care Provider: [name] Other Clinician: Referring Provider: Treating Provider/Extender: Hollie Beach in Treatment: 2 Subjective Chief Complaint Information obtained from Patient 05/25/2022; left lower extremity wound following a dog scratch History of Present Illness (HPI) Admission 05/25/2022 [name] is a 76 year old female with a past medical history of diet-controlled type 2 diabetes, chronic 3 stage kidney disease and venous insufficiency that presents to the clinic for a 1 month history of nonhealing ulcer to the left leg. She states that her dog scratched her causing a wound. She has developed cellulitis in this leg and has been treated with clindamycin by her primary care physician. She reports improvement in her symptoms. She currently denies signs of infection. She has been keeping the area covered. She does not wear compression stockings. She is on 80 mg of Lasix twice daily. She has had reflux studies to the left leg on 07/2021 that notes venous reflux throughout the left common femoral vein and greater saphenous vein. She has not had an ablation. She follows with vein and vascular for her venous insufficiency. 8/14; patient presents for follow-up. She states that the wrap stayed on for 4 days and eventually slid down. She has been wearing her compression stocking and doing dressing changes with Hydrofera Blue since. She also states she has a hard time putting on her shoes with the 3 layer compression. She denies signs of infection. 8/21; patient presents for follow-up. She again had trouble with the wrap sliding down. We have been  using Hydrofera Blue with gentamicin under 2 layer Coflex. She currently denies signs of infection. Patient History Information obtained from Patient. Family History Cancer - Siblings, Diabetes - Siblings, Heart Disease - Mother, Hypertension - Mother, No family history of Hereditary Spherocytosis, Kidney Disease, Lung Disease, Seizures, Stroke, Thyroid Problems, Tuberculosis. Social History Never smoker, Marital Status - Married, Alcohol Use - Never, Drug Use - No History, Caffeine Use - Moderate. Medical History Eyes Patient has history of Cataracts Denies history of Glaucoma Ear/Nose/Mouth/Throat Denies history of Chronic sinus problems/congestion, Middle ear problems Hematologic/Lymphatic Patient has history of Anemia Denies history of Hemophilia, Human Immunodeficiency Virus, Lymphedema, Sickle Cell Disease Respiratory Denies history of Aspiration, Asthma, Chronic Obstructive Pulmonary Disease (COPD), Pneumothorax, Sleep Apnea, Tuberculosis Cardiovascular  Patient has history of Congestive Heart Failure, Hypertension Denies history of Angina, Arrhythmia, Coronary Artery Disease, Deep Vein Thrombosis, Hypotension, Myocardial Infarction, Peripheral Arterial Disease, Peripheral Venous Disease, Phlebitis, Vasculitis Gastrointestinal Denies history of Cirrhosis , Colitis, Crohnoos, Hepatitis A, Hepatitis B, Hepatitis C Endocrine Denies history of Type I Diabetes, Type II Diabetes Genitourinary Denies history of End Stage Renal Disease Immunological Denies history of Lupus Erythematosus, Raynaudoos, Scleroderma Integumentary (Skin) Denies history of History of Burn Musculoskeletal Patient has history of Osteoarthritis Denies history of Gout, Rheumatoid Arthritis, Osteomyelitis Neurologic Denies history of Dementia, Neuropathy, Quadriplegia, Paraplegia, Seizure Disorder Oncologic Denies history of Received Chemotherapy Psychiatric Denies history of Anorexia/bulimia,  Confinement Anxiety Hospitalization/Surgery History - right total knee replacement. Medical A Surgical History Notes nd Gastrointestinal diverticulosis hiatal hernia hyperlipidemia Musculoskeletal DJD Objective Constitutional respirations regular, non-labored and within target range for patient.. Vitals Time Taken: 8:54 AM, Height: 63 in, Temperature: 98.2 F, Pulse: 76 bpm, Respiratory Rate: 18 breaths/min, Blood Pressure: 134/83 mmHg. Cardiovascular 2+ dorsalis pedis/posterior tibialis pulses. Psychiatric pleasant and cooperative. General Notes: Left lower extremity: T the lateral aspect there is an open wound with granulation tissue and nonviable tissue. No surrounding signs of infection o including increased warmth, erythema or purulent drainage. 2+ pitting edema to the knee. Integumentary (Hair, Skin) Wound #1 status is Open. Original cause of wound was Laceration. The date acquired was: 04/24/2022. The wound has been in treatment 2 weeks. The wound is located on the Left,Lateral Lower Leg. The wound measures 0.7cm length x 2.1cm width x 0.3cm depth; 1.155cm^2 area and 0.346cm^3 volume. There is Fat Layer (Subcutaneous Tissue) exposed. There is no tunneling or undermining noted. There is a medium amount of serosanguineous drainage noted. The wound margin is distinct with the outline attached to the wound base. There is medium (34-66%) red granulation within the wound bed. There is a medium (34-66%) amount of necrotic tissue within the wound bed including Adherent Slough. Assessment Active Problems ICD-10 Chronic venous hypertension (idiopathic) with ulcer of left lower extremity Other contact with dog, initial encounter Non-pressure chronic ulcer of other part of left lower leg with fat layer exposed Type 2 diabetes mellitus with other skin ulcer Patient's wound has shown improvement in size and appearance since last clinic visit. I debrided nonviable tissue. At this time I  recommended continuing Hydrofera Blue and stopping the antibiotic ointment but adding Santyl under the wrap. We will try to reinforce again at the top with an Unna boot layer. Follow- up in 1 week. Procedures Wound #1 Pre-procedure diagnosis of Wound #1 is a Venous Leg Ulcer located on the Left,Lateral Lower Leg .Severity of Tissue Pre Debridement is: Fat layer exposed. There was a Excisional Skin/Subcutaneous Tissue Debridement with a total area of 1.47 sq cm performed by Kalman Shan, DO. With the following instrument(s): Curette to remove Viable and Non-Viable tissue/material. Material removed includes Subcutaneous Tissue, Slough, Skin: Dermis, and Skin: Epidermis after achieving pain control using Lidocaine 5% topical ointment. A time out was conducted at 09:20, prior to the start of the procedure. A Minimum amount of bleeding was controlled with Pressure. The procedure was tolerated well with a pain level of 0 throughout and a pain level of 0 following the procedure. Post Debridement Measurements: 0.7cm length x 2.1cm width x 0.3cm depth; 0.346cm^3 volume. Character of Wound/Ulcer Post Debridement is improved. Severity of Tissue Post Debridement is: Fat layer exposed. Post procedure Diagnosis Wound #1: Same as Pre-Procedure Pre-procedure diagnosis of Wound #1 is a Venous Leg Ulcer located  on the Left,Lateral Lower Leg . There was a Double Layer Compression Therapy Procedure by [name], RN. Post procedure Diagnosis Wound #1: Same as Pre-Procedure Plan Follow-up Appointments: Return Appointment in 1 week. - Dr. Heber [place] and [place], Room 8 06/15/2022 0900 Monday Return Appointment in 2 weeks. - Dr. Heber [place] and [place], Room 8 06/23/2022 0900 Tuesday Other: - bring in stockings weekly in case wound closure. Anesthetic: (In clinic) Topical Lidocaine 5% applied to wound bed Bathing/ Shower/ Hygiene: May shower with protection but do not get wound dressing(s) wet. Edema Control - Lymphedema  / SCD / Other: Elevate legs to the level of the heart or above for 30 minutes daily and/or when sitting, a frequency of: - 2-3 times a day throughout the day. Avoid standing for long periods of time. Exercise regularly Moisturize legs daily. - apply to right leg every night before bed. Compression stocking or Garment 20-30 mm/Hg pressure to: - Patient to purchase compression stockings. Once you purchase apply in the morning and remove at night to right leg for now. WOUND #1: - Lower Leg Wound Laterality: Left, Lateral Peri-Wound Care: Sween Lotion (Moisturizing lotion) 1 x Per Week/30 Days Discharge Instructions: Apply moisturizing lotion as directed Prim Dressing: Hydrofera Blue Classic Foam, 2x2 in 1 x Per Week/30 Days ary Discharge Instructions: Moisten with saline prior to applying to wound bed Prim Dressing: Santyl Ointment 1 x Per Week/30 Days ary Discharge Instructions: Apply nickel thick amount to wound bed as instructed Secondary Dressing: Woven Gauze Sponge, Non-Sterile 4x4 in 1 x Per Week/30 Days Discharge Instructions: Apply over primary dressing as directed. Secondary Dressing: Zetuvit Plus 4x8 in 1 x Per Week/30 Days Discharge Instructions: Apply over primary dressing as directed. Secured With: unna boot first layer 1 x Per Week/30 Days Discharge Instructions: apply first layer of unna boot to upper portion of lower leg to secure compression wrap in place. Compression Wrap: CoFlex TLC XL 2-layer Compression System 4x7 (in/yd) 1 x Per Week/30 Days Discharge Instructions: Apply CoFlex 2-layer compression as directed. (alt for 4 layer) 1. In office sharp debridement 2. Hydrofera Blue and Santyl under 2 layer Coflex 3. Follow-up in 1 week Electronic Signature(s) Signed: 06/08/2022 2:26:36 PM By: Kalman Shan DO Entered By: Kalman Shan on 06/08/2022 12:58:00 -------------------------------------------------------------------------------- HxROS Details Patient  Name: Date of Service: [name], Brandy RO [name] 06/08/2022 9:00 A M Medical Record Number: [id number] Patient Account Number: [id number] Date of Birth/Sex: Treating RN: Jun 11, 1946 (76 y.o. F) Primary Care Provider: [name] Other Clinician: Referring Provider: Treating Provider/Extender: Hollie Beach in Treatment: 2 Information Obtained From Patient Eyes Medical History: Positive for: Cataracts Negative for: Glaucoma Ear/Nose/Mouth/Throat Medical History: Negative for: Chronic sinus problems/congestion; Middle ear problems Hematologic/Lymphatic Medical History: Positive for: Anemia Negative for: Hemophilia; Human Immunodeficiency Virus; Lymphedema; Sickle Cell Disease Respiratory Medical History: Negative for: Aspiration; Asthma; Chronic Obstructive Pulmonary Disease (COPD); Pneumothorax; Sleep Apnea; Tuberculosis Cardiovascular Medical History: Positive for: Congestive Heart Failure; Hypertension Negative for: Angina; Arrhythmia; Coronary Artery Disease; Deep Vein Thrombosis; Hypotension; Myocardial Infarction; Peripheral Arterial Disease; Peripheral Venous Disease; Phlebitis; Vasculitis Gastrointestinal Medical History: Negative for: Cirrhosis ; Colitis; Crohns; Hepatitis A; Hepatitis B; Hepatitis C Past Medical History Notes: diverticulosis hiatal hernia hyperlipidemia Endocrine Medical History: Negative for: Type I Diabetes; Type II Diabetes Genitourinary Medical History: Negative for: End Stage Renal Disease Immunological Medical History: Negative for: Lupus Erythematosus; Raynauds; Scleroderma Integumentary (Skin) Medical History: Negative for: History of Burn Musculoskeletal Medical History: Positive for: Osteoarthritis Negative for: Gout; Rheumatoid  Arthritis; Osteomyelitis Past Medical History Notes: DJD Neurologic Medical History: Negative for: Dementia; Neuropathy; Quadriplegia; Paraplegia; Seizure Disorder Oncologic Medical  History: Negative for: Received Chemotherapy Psychiatric Medical History: Negative for: Anorexia/bulimia; Confinement Anxiety HBO Extended History Items Eyes: Cataracts Immunizations Pneumococcal Vaccine: Received Pneumococcal Vaccination: Yes Received Pneumococcal Vaccination On or After 60th Birthday: Yes Implantable Devices None Hospitalization / Surgery History Type of Hospitalization/Surgery right total knee replacement Family and Social History Cancer: Yes - Siblings; Diabetes: Yes - Siblings; Heart Disease: Yes - Mother; Hereditary Spherocytosis: No; Hypertension: Yes - Mother; Kidney Disease: No; Lung Disease: No; Seizures: No; Stroke: No; Thyroid Problems: No; Tuberculosis: No; Never smoker; Marital Status - Married; Alcohol Use: Never; Drug Use: No History; Caffeine Use: Moderate; Financial Concerns: No; Food, Clothing or Shelter Needs: No; Support System Lacking: No; Transportation Concerns: No Electronic Signature(s) Signed: 06/08/2022 2:26:36 PM By: Kalman Shan DO Entered By: Kalman Shan on 06/08/2022 12:56:13 -------------------------------------------------------------------------------- SuperBill Details Patient Name: Date of Service: [name], Brandy RO [name] 06/08/2022 Medical Record Number: [id number] Patient Account Number: [id number] Date of Birth/Sex: Treating RN: 01/03/1946 (76 y.o. [name] Primary Care Provider: [name] Other Clinician: Referring Provider: Treating Provider/Extender: Hollie Beach in Treatment: 2 Diagnosis Coding ICD-10 Codes Code Description [phone] Chronic venous hypertension (idiopathic) with ulcer of left lower extremity W54.8XXA Other contact with dog, initial encounter [phone] Non-pressure chronic ulcer of other part of left lower leg with fat layer exposed E11.622 Type 2 diabetes mellitus with other skin ulcer Facility Procedures CPT4 Code: [id number] Description: 76811 - DEB SUBQ TISSUE  20 SQ CM/< ICD-10 Diagnosis Description L97.822 Non-pressure chronic ulcer of other part of left lower leg with fat layer expo Modifier: sed Quantity: 1 Physician Procedures : CPT4 Code Description Modifier [id number] - WC PHYS SUBQ TISS 20 SQ CM ICD-10 Diagnosis Description L97.822 Non-pressure chronic ulcer of other part of left lower leg with fat layer exposed Quantity: 1 Electronic Signature(s) Signed: 06/08/2022 2:26:36 PM By: Kalman Shan DO Entered By: Kalman Shan on 06/08/2022 12:58:17

## 2022-06-15 ENCOUNTER — Encounter (HOSPITAL_BASED_OUTPATIENT_CLINIC_OR_DEPARTMENT_OTHER): Payer: Medicare HMO | Admitting: Internal Medicine

## 2022-06-15 DIAGNOSIS — E11622 Type 2 diabetes mellitus with other skin ulcer: Secondary | ICD-10-CM | POA: Diagnosis not present

## 2022-06-15 DIAGNOSIS — I130 Hypertensive heart and chronic kidney disease with heart failure and stage 1 through stage 4 chronic kidney disease, or unspecified chronic kidney disease: Secondary | ICD-10-CM | POA: Diagnosis not present

## 2022-06-15 DIAGNOSIS — E1122 Type 2 diabetes mellitus with diabetic chronic kidney disease: Secondary | ICD-10-CM | POA: Diagnosis not present

## 2022-06-15 DIAGNOSIS — N183 Chronic kidney disease, stage 3 unspecified: Secondary | ICD-10-CM | POA: Diagnosis not present

## 2022-06-15 DIAGNOSIS — I509 Heart failure, unspecified: Secondary | ICD-10-CM | POA: Diagnosis not present

## 2022-06-15 DIAGNOSIS — L97822 Non-pressure chronic ulcer of other part of left lower leg with fat layer exposed: Secondary | ICD-10-CM

## 2022-06-15 DIAGNOSIS — I872 Venous insufficiency (chronic) (peripheral): Secondary | ICD-10-CM | POA: Diagnosis not present

## 2022-06-15 NOTE — Progress Notes (Signed)
IDA, MILBRATH ([id number]) Visit Report for 06/15/2022 Chief Complaint Document Details Patient Name: Date of Service: [name] Brandy B. 06/15/2022 9:00 A M Medical Record Number: [id number] Patient Account Number: [id number] Date of Birth/Sex: Treating RN: 02-09-1946 (76 y.o. [name] Primary Care Provider: [name] Other Clinician: Referring Provider: Treating Provider/Extender: Hollie Beach in Treatment: 3 Information Obtained from: Patient Chief Complaint 05/25/2022; left lower extremity wound following a dog scratch Electronic Signature(s) Signed: 06/15/2022 10:13:31 AM By: Kalman Shan DO Entered By: Kalman Shan on 06/15/2022 09:36:32 -------------------------------------------------------------------------------- Debridement Details Patient Name: Date of Service: [name], Brandy Brandy Brandy B. 06/15/2022 9:00 A M Medical Record Number: [id number] Patient Account Number: [id number] Date of Birth/Sex: Treating RN: 13-Apr-1946 (76 y.o. [name], [name] Primary Care Provider: [name] Other Clinician: Referring Provider: Treating Provider/Extender: Hollie Beach in Treatment: 3 Debridement Performed for Assessment: Wound #1 Left,Lateral Lower Leg Performed By: Physician Kalman Shan, DO Debridement Type: Debridement Severity of Tissue Pre Debridement: Fat layer exposed Level of Consciousness (Pre-procedure): Awake and Alert Pre-procedure Verification/Time Out Yes - 09:10 Taken: Start Time: 09:11 Pain Control: Lidocaine 5% topical ointment T Area Debrided (L x W): otal 0.3 (cm) x 1.8 (cm) = 0.54 (cm) Tissue and other material debrided: Viable, Non-Viable, Slough, Subcutaneous, Slough Level: Skin/Subcutaneous Tissue Debridement Description: Excisional Instrument: Curette Bleeding: Minimum Hemostasis Achieved: Pressure End Time: 09:15 Procedural Pain: 0 Post Procedural Pain: 0 Response to Treatment:  Procedure was tolerated well Level of Consciousness (Post- Awake and Alert procedure): Post Debridement Measurements of Total Wound Length: (cm) 0.3 Width: (cm) 1.8 Depth: (cm) 0.2 Volume: (cm) 0.085 Character of Wound/Ulcer Post Debridement: Improved Severity of Tissue Post Debridement: Fat layer exposed Post Procedure Diagnosis Same as Pre-procedure Electronic Signature(s) Signed: 06/15/2022 10:13:31 AM By: Kalman Shan DO Signed: 06/15/2022 5:13:32 PM By: [name] RN, BSN Entered By: [name] on 06/15/2022 09:15:12 -------------------------------------------------------------------------------- HPI Details Patient Name: Date of Service: [name], Brandy Brandy Brandy B. 06/15/2022 9:00 A M Medical Record Number: [id number] Patient Account Number: [id number] Date of Birth/Sex: Treating RN: [birth year] (76 y.o. [name] Primary Care Provider: [name] Other Clinician: Referring Provider: Treating Provider/Extender: Hollie Beach in Treatment: 3 History of Present Illness HPI Description: Admission 05/25/2022 [name] is a 76 year old female with a past medical history of diet-controlled type 2 diabetes, chronic 3 stage kidney disease and venous insufficiency that presents to the clinic for a 1 month history of nonhealing ulcer to the left leg. She states that her dog scratched her causing a wound. She has developed cellulitis in this leg and has been treated with clindamycin by her primary care physician. She reports improvement in her symptoms. She currently denies signs of infection. She has been keeping the area covered. She does not wear compression stockings. She is on 80 mg of Lasix twice daily. She has had reflux studies to the left leg on 07/2021 that notes venous reflux throughout the left common femoral vein and greater saphenous vein. She has not had an ablation. She follows with vein and vascular for her venous insufficiency. 8/14;  patient presents for follow-up. She states that the wrap stayed on for 4 days and eventually slid down. She has been wearing her compression stocking and doing dressing changes with Hydrofera Blue since. She also states she has a hard time putting on her shoes with the 3 layer compression. She denies signs of infection. 8/21; patient presents for  follow-up. She again had trouble with the wrap sliding down. We have been using Hydrofera Blue with gentamicin under 2 layer Coflex. She currently denies signs of infection. 8/28; patient presents for follow-up. She reports taking the wrap off yesterday. We have been using Hydrofera Blue with gentamicin under 2 layer Coflex. She states that the wrap does slide down but remains over the wound. She is currently wearing her compression stocking. She would like to do this instead of the compression wrap. Electronic Signature(s) Signed: 06/15/2022 10:13:31 AM By: Kalman Shan DO Entered By: Kalman Shan on 06/15/2022 09:37:33 -------------------------------------------------------------------------------- Physical Exam Details Patient Name: Date of Service: [name] Brandy Brandy B. 06/15/2022 9:00 A M Medical Record Number: [id number] Patient Account Number: [id number] Date of Birth/Sex: Treating RN: Mar 14, 1946 (76 y.o. [name] Primary Care Provider: [name] Other Clinician: Referring Provider: Treating Provider/Extender: Hollie Beach in Treatment: 3 Constitutional respirations regular, non-labored and within target range for patient.. Cardiovascular 2+ dorsalis pedis/posterior tibialis pulses. Psychiatric pleasant and cooperative. Notes Left lower extremity: T the lateral aspect there is an open wound with granulation tissue and nonviable tissue. No surrounding signs of infection including o increased warmth, erythema or purulent drainage. 2+ pitting edema to the knee. Electronic Signature(s) Signed:  06/15/2022 10:13:31 AM By: Kalman Shan DO Entered By: Kalman Shan on 06/15/2022 09:37:59 -------------------------------------------------------------------------------- Physician Orders Details Patient Name: Date of Service: [name], Brandy Brandy Brandy B. 06/15/2022 9:00 A M Medical Record Number: [id number] Patient Account Number: [id number] Date of Birth/Sex: Treating RN: 1946/04/30 (76 y.o. [name] Primary Care Provider: [name] Other Clinician: Referring Provider: Treating Provider/Extender: Hollie Beach in Treatment: 3 Verbal / Phone Orders: No Diagnosis Coding ICD-10 Coding Code Description [id number] Chronic venous hypertension (idiopathic) with ulcer of left lower extremity W54.8XXA Other contact with dog, initial encounter [phone] Non-pressure chronic ulcer of other part of left lower leg with fat layer exposed E11.622 Type 2 diabetes mellitus with other skin ulcer Follow-up Appointments ppointment in 1 week. - Dr. Heber [place] [facility] [place], Room 8 06/23/2022 0900 Tuesday Return A ppointment in 2 weeks. - Dr. Heber [place] [facility] [place], Room 8 06/29/2022 0900 Tuesday Return A Anesthetic (In clinic) Topical Lidocaine 5% applied to wound bed Bathing/ Shower/ Hygiene May shower with protection but do not get wound dressing(s) wet. Edema Control - Lymphedema / SCD / Other Elevate legs to the level of the heart or above for 30 minutes daily and/or when sitting, a frequency of: - 2-3 times a day throughout the day. Avoid standing for long periods of time. Exercise regularly Moisturize legs daily. - lotion both legs every night before bed. Compression stocking or Garment 20-30 mm/Hg pressure to: - apply in the morning and remove at night. Wound Treatment Wound #1 - Lower Leg Wound Laterality: Left, Lateral Peri-Wound Care: Skin Prep Every Other Day/30 Days Discharge Instructions: Use skin prep as directed Peri-Wound Care: Sween Lotion (Moisturizing lotion) Every  Other Day/30 Days Discharge Instructions: Apply moisturizing lotion as directed Prim Dressing: MediHoney Gel, tube 1.5 (oz) Every Other Day/30 Days ary Discharge Instructions: Apply to wound bed as instructed Compression Wrap: 20-30 mmHg compression stocking Every Other Day/30 Days Discharge Instructions: apply in the morning and remove at night. Electronic Signature(s) Signed: 06/15/2022 10:13:31 AM By: Kalman Shan DO Entered By: Kalman Shan on 06/15/2022 09:38:07 -------------------------------------------------------------------------------- Problem List Details Patient Name: Date of Service: [name], Brandy Brandy Brandy B. 06/15/2022 9:00 A M Medical Record Number: [id number] Patient Account  Number: [id number] Date of Birth/Sex: Treating RN: [birth year] (76 y.o. [name] Primary Care Provider: [name] Other Clinician: Referring Provider: Treating Provider/Extender: Hollie Beach in Treatment: 3 Active Problems ICD-10 Encounter Code Description Active Date MDM Diagnosis I87.312 Chronic venous hypertension (idiopathic) with ulcer of left lower extremity 05/25/2022 No Yes W54.8XXA Other contact with dog, initial encounter 05/25/2022 No Yes L97.822 Non-pressure chronic ulcer of other part of left lower leg with fat layer 05/25/2022 No Yes exposed E11.622 Type 2 diabetes mellitus with other skin ulcer 05/25/2022 No Yes Inactive Problems Resolved Problems Electronic Signature(s) Signed: 06/15/2022 10:13:31 AM By: Kalman Shan DO Entered By: Kalman Shan on 06/15/2022 09:36:12 -------------------------------------------------------------------------------- Progress Note Details Patient Name: Date of Service: [name], Brandy Brandy Brandy B. 06/15/2022 9:00 A M Medical Record Number: [id number] Patient Account Number: [id number] Date of Birth/Sex: Treating RN: Jun 21, 1946 (76 y.o. [name] Primary Care Provider: [name] Other Clinician: Referring  Provider: Treating Provider/Extender: Hollie Beach in Treatment: 3 Subjective Chief Complaint Information obtained from Patient 05/25/2022; left lower extremity wound following a dog scratch History of Present Illness (HPI) Admission 05/25/2022 [name] is a 76 year old female with a past medical history of diet-controlled type 2 diabetes, chronic 3 stage kidney disease and venous insufficiency that presents to the clinic for a 1 month history of nonhealing ulcer to the left leg. She states that her dog scratched her causing a wound. She has developed cellulitis in this leg and has been treated with clindamycin by her primary care physician. She reports improvement in her symptoms. She currently denies signs of infection. She has been keeping the area covered. She does not wear compression stockings. She is on 80 mg of Lasix twice daily. She has had reflux studies to the left leg on 07/2021 that notes venous reflux throughout the left common femoral vein and greater saphenous vein. She has not had an ablation. She follows with vein and vascular for her venous insufficiency. 8/14; patient presents for follow-up. She states that the wrap stayed on for 4 days and eventually slid down. She has been wearing her compression stocking and doing dressing changes with Hydrofera Blue since. She also states she has a hard time putting on her shoes with the 3 layer compression. She denies signs of infection. 8/21; patient presents for follow-up. She again had trouble with the wrap sliding down. We have been using Hydrofera Blue with gentamicin under 2 layer Coflex. She currently denies signs of infection. 8/28; patient presents for follow-up. She reports taking the wrap off yesterday. We have been using Hydrofera Blue with gentamicin under 2 layer Coflex. She states that the wrap does slide down but remains over the wound. She is currently wearing her compression stocking. She  would like to do this instead of the compression wrap. Patient History Information obtained from Patient. Family History Cancer - Siblings, Diabetes - Siblings, Heart Disease - Mother, Hypertension - Mother, No family history of Hereditary Spherocytosis, Kidney Disease, Lung Disease, Seizures, Stroke, Thyroid Problems, Tuberculosis. Social History Never smoker, Marital Status - Married, Alcohol Use - Never, Drug Use - No History, Caffeine Use - Moderate. Medical History Eyes Patient has history of Cataracts Denies history of Glaucoma Ear/Nose/Mouth/Throat Denies history of Chronic sinus problems/congestion, Middle ear problems Hematologic/Lymphatic Patient has history of Anemia Denies history of Hemophilia, Human Immunodeficiency Virus, Lymphedema, Sickle Cell Disease Respiratory Denies history of Aspiration, Asthma, Chronic Obstructive Pulmonary Disease (COPD), Pneumothorax, Sleep Apnea, Tuberculosis Cardiovascular Patient  has history of Congestive Heart Failure, Hypertension Denies history of Angina, Arrhythmia, Coronary Artery Disease, Deep Vein Thrombosis, Hypotension, Myocardial Infarction, Peripheral Arterial Disease, Peripheral Venous Disease, Phlebitis, Vasculitis Gastrointestinal Denies history of Cirrhosis , Colitis, Crohnoos, Hepatitis A, Hepatitis B, Hepatitis C Endocrine Denies history of Type I Diabetes, Type II Diabetes Genitourinary Denies history of End Stage Renal Disease Immunological Denies history of Lupus Erythematosus, Raynaudoos, Scleroderma Integumentary (Skin) Denies history of History of Burn Musculoskeletal Patient has history of Osteoarthritis Denies history of Gout, Rheumatoid Arthritis, Osteomyelitis Neurologic Denies history of Dementia, Neuropathy, Quadriplegia, Paraplegia, Seizure Disorder Oncologic Denies history of Received Chemotherapy Psychiatric Denies history of Anorexia/bulimia, Confinement Anxiety Hospitalization/Surgery History -  right total knee replacement. Medical A Surgical History Notes nd Gastrointestinal diverticulosis hiatal hernia hyperlipidemia Musculoskeletal DJD Objective Constitutional respirations regular, non-labored and within target range for patient.. Vitals Time Taken: 8:58 AM, Height: 63 in, Temperature: 98 F, Pulse: 74 bpm, Respiratory Rate: 20 breaths/min, Blood Pressure: 131/71 mmHg. Cardiovascular 2+ dorsalis pedis/posterior tibialis pulses. Psychiatric pleasant and cooperative. General Notes: Left lower extremity: T the lateral aspect there is an open wound with granulation tissue and nonviable tissue. No surrounding signs of infection o including increased warmth, erythema or purulent drainage. 2+ pitting edema to the knee. Integumentary (Hair, Skin) Wound #1 status is Open. Original cause of wound was Laceration. The date acquired was: 04/24/2022. The wound has been in treatment 3 weeks. The wound is located on the Left,Lateral Lower Leg. The wound measures 0.3cm length x 1.8cm width x 0.2cm depth; 0.424cm^2 area and 0.085cm^3 volume. There is Fat Layer (Subcutaneous Tissue) exposed. There is no tunneling or undermining noted. There is a medium amount of serosanguineous drainage noted. The wound margin is distinct with the outline attached to the wound base. There is medium (34-66%) red granulation within the wound bed. There is a medium (34-66%) amount of necrotic tissue within the wound bed including Adherent Slough. Assessment Active Problems ICD-10 Chronic venous hypertension (idiopathic) with ulcer of left lower extremity Other contact with dog, initial encounter Non-pressure chronic ulcer of other part of left lower leg with fat layer exposed Type 2 diabetes mellitus with other skin ulcer Patient's wound has shown improvement in size and appearance since last clinic visit. I debrided nonviable tissue. Patient would like to do daily dressing changes and use her compression  stockings daily instead of the in office compression wrap. I recommended Medihoney. Follow-up in 1 week. Procedures Wound #1 Pre-procedure diagnosis of Wound #1 is a Venous Leg Ulcer located on the Left,Lateral Lower Leg .Severity of Tissue Pre Debridement is: Fat layer exposed. There was a Excisional Skin/Subcutaneous Tissue Debridement with a total area of 0.54 sq cm performed by Kalman Shan, DO. With the following instrument(s): Curette to remove Viable and Non-Viable tissue/material. Material removed includes Subcutaneous Tissue and Slough and after achieving pain control using Lidocaine 5% topical ointment. A time out was conducted at 09:10, prior to the start of the procedure. A Minimum amount of bleeding was controlled with Pressure. The procedure was tolerated well with a pain level of 0 throughout and a pain level of 0 following the procedure. Post Debridement Measurements: 0.3cm length x 1.8cm width x 0.2cm depth; 0.085cm^3 volume. Character of Wound/Ulcer Post Debridement is improved. Severity of Tissue Post Debridement is: Fat layer exposed. Post procedure Diagnosis Wound #1: Same as Pre-Procedure Plan Follow-up Appointments: Return Appointment in 1 week. - Dr. Heber [place] and [place], Room 8 06/23/2022 0900 Tuesday Return Appointment in 2 weeks. - Dr.  Heber [place] [facility] [place], Room 8 06/29/2022 0900 Tuesday Anesthetic: (In clinic) Topical Lidocaine 5% applied to wound bed Bathing/ Shower/ Hygiene: May shower with protection but do not get wound dressing(s) wet. Edema Control - Lymphedema / SCD / Other: Elevate legs to the level of the heart or above for 30 minutes daily and/or when sitting, a frequency of: - 2-3 times a day throughout the day. Avoid standing for long periods of time. Exercise regularly Moisturize legs daily. - lotion both legs every night before bed. Compression stocking or Garment 20-30 mm/Hg pressure to: - apply in the morning and remove at night. WOUND #1: - Lower Leg  Wound Laterality: Left, Lateral Peri-Wound Care: Skin Prep Every Other Day/30 Days Discharge Instructions: Use skin prep as directed Peri-Wound Care: Sween Lotion (Moisturizing lotion) Every Other Day/30 Days Discharge Instructions: Apply moisturizing lotion as directed Prim Dressing: MediHoney Gel, tube 1.5 (oz) Every Other Day/30 Days ary Discharge Instructions: Apply to wound bed as instructed Com pression Wrap: 20-30 mmHg compression stocking Every Other Day/30 Days Discharge Instructions: apply in the morning and remove at night. 1. In office sharp debridement 2. Medihoney 3. Daily compression stockings Electronic Signature(s) Signed: 06/15/2022 10:13:31 AM By: Kalman Shan DO Entered By: Kalman Shan on 06/15/2022 09:42:12 -------------------------------------------------------------------------------- HxROS Details Patient Name: Date of Service: [name], Brandy Brandy Brandy B. 06/15/2022 9:00 A M Medical Record Number: [id number] Patient Account Number: [id number] Date of Birth/Sex: Treating RN: 1946/06/29 (76 y.o. [name], [name] Primary Care Provider: [name] Other Clinician: Referring Provider: Treating Provider/Extender: Hollie Beach in Treatment: 3 Information Obtained From Patient Eyes Medical History: Positive for: Cataracts Negative for: Glaucoma Ear/Nose/Mouth/Throat Medical History: Negative for: Chronic sinus problems/congestion; Middle ear problems Hematologic/Lymphatic Medical History: Positive for: Anemia Negative for: Hemophilia; Human Immunodeficiency Virus; Lymphedema; Sickle Cell Disease Respiratory Medical History: Negative for: Aspiration; Asthma; Chronic Obstructive Pulmonary Disease (COPD); Pneumothorax; Sleep Apnea; Tuberculosis Cardiovascular Medical History: Positive for: Congestive Heart Failure; Hypertension Negative for: Angina; Arrhythmia; Coronary Artery Disease; Deep Vein Thrombosis; Hypotension; Myocardial  Infarction; Peripheral Arterial Disease; Peripheral Venous Disease; Phlebitis; Vasculitis Gastrointestinal Medical History: Negative for: Cirrhosis ; Colitis; Crohns; Hepatitis A; Hepatitis B; Hepatitis C Past Medical History Notes: diverticulosis hiatal hernia hyperlipidemia Endocrine Medical History: Negative for: Type I Diabetes; Type II Diabetes Genitourinary Medical History: Negative for: End Stage Renal Disease Immunological Medical History: Negative for: Lupus Erythematosus; Raynauds; Scleroderma Integumentary (Skin) Medical History: Negative for: History of Burn Musculoskeletal Medical History: Positive for: Osteoarthritis Negative for: Gout; Rheumatoid Arthritis; Osteomyelitis Past Medical History Notes: DJD Neurologic Medical History: Negative for: Dementia; Neuropathy; Quadriplegia; Paraplegia; Seizure Disorder Oncologic Medical History: Negative for: Received Chemotherapy Psychiatric Medical History: Negative for: Anorexia/bulimia; Confinement Anxiety HBO Extended History Items Eyes: Cataracts Immunizations Pneumococcal Vaccine: Received Pneumococcal Vaccination: Yes Received Pneumococcal Vaccination On or After 60th Birthday: Yes Implantable Devices None Hospitalization / Surgery History Type of Hospitalization/Surgery right total knee replacement Family and Social History Cancer: Yes - Siblings; Diabetes: Yes - Siblings; Heart Disease: Yes - Mother; Hereditary Spherocytosis: No; Hypertension: Yes - Mother; Kidney Disease: No; Lung Disease: No; Seizures: No; Stroke: No; Thyroid Problems: No; Tuberculosis: No; Never smoker; Marital Status - Married; Alcohol Use: Never; Drug Use: No History; Caffeine Use: Moderate; Financial Concerns: No; Food, Clothing or Shelter Needs: No; Support System Lacking: No; Transportation Concerns: No Electronic Signature(s) Signed: 06/15/2022 10:13:31 AM By: Kalman Shan DO Signed: 06/15/2022 5:13:32 PM By: [name] RN, BSN Entered By: Kalman Shan on 06/15/2022 09:37:39 -------------------------------------------------------------------------------- SuperBill Details  Patient Name: Date of Service: [name] 06/15/2022 Medical Record Number: [id number] Patient Account Number: [id number] Date of Birth/Sex: Treating RN: 1946/04/10 (76 y.o. [name] Primary Care Provider: [name] Other Clinician: Referring Provider: Treating Provider/Extender: Hollie Beach in Treatment: 3 Diagnosis Coding ICD-10 Codes Code Description [phone] Chronic venous hypertension (idiopathic) with ulcer of left lower extremity W54.8XXA Other contact with dog, initial encounter [phone] Non-pressure chronic ulcer of other part of left lower leg with fat layer exposed E11.622 Type 2 diabetes mellitus with other skin ulcer Facility Procedures CPT4 Code: [id number] Description: 07121 - DEB SUBQ TISSUE 20 SQ CM/< ICD-10 Diagnosis Description L97.822 Non-pressure chronic ulcer of other part of left lower leg with fat layer expo Modifier: sed Quantity: 1 Physician Procedures : CPT4 Code Description Modifier [id number] 11042 - WC PHYS SUBQ TISS 20 SQ CM ICD-10 Diagnosis Description L97.822 Non-pressure chronic ulcer of other part of left lower leg with fat layer exposed Quantity: 1 Electronic Signature(s) Signed: 06/15/2022 10:13:31 AM By: Kalman Shan DO Entered By: Kalman Shan on 06/15/2022 09:42:20

## 2022-06-19 NOTE — Progress Notes (Signed)
VI, BIDDINGER ([id number]) Visit Report for 06/15/2022 Arrival Information Details Patient Name: Date of Service: Brandy Corolla [name] 06/15/2022 9:00 A M Medical Record Number: [id number] Patient Account Number: [id number] Date of Birth/Sex: Treating RN: Dec 13, 1945 (76 y.o. [name], [name] Primary Care Destry Dauber: [name] Other Clinician: Referring Keiton Cosma: Treating Aariz Maish/Extender: Hollie Beach in Treatment: 3 Visit Information History Since Last Visit Added or deleted any medications: No Patient Arrived: [name] Any new allergies or adverse reactions: No Arrival Time: 08:58 Had a fall or experienced change in No Accompanied By: son activities of daily living that may affect Transfer Assistance: None risk of falls: Patient Identification Verified: Yes Signs or symptoms of abuse/neglect since last visito No Secondary Verification Process Completed: Yes Hospitalized since last visit: No Patient Requires Transmission-Based Precautions: No Implantable device outside of the clinic excluding No Patient Has Alerts: No cellular tissue based products placed in the center since last visit: Has Dressing in Place as Prescribed: Yes Has Compression in Place as Prescribed: No Pain Present Now: No Notes per patient removed compression wrap Sunday morning and applied new dressing with compression stockings. Electronic Signature(s) Signed: 06/15/2022 5:13:32 PM By: [name] RN, BSN Entered By: [name] on 06/15/2022 09:04:15 -------------------------------------------------------------------------------- Encounter Discharge Information Details Patient Name: Date of Service: [name], Brandy RO [name] 06/15/2022 9:00 A M Medical Record Number: [id number] Patient Account Number: [id number] Date of Birth/Sex: Treating RN: 27-Jan-1946 (76 y.o. [name], [name] Primary Care Tameaka Eichhorn: [name] Other Clinician: Referring Brennyn Ortlieb: Treating Drucella Karbowski/Extender:  Hollie Beach in Treatment: 3 Encounter Discharge Information Items Post Procedure Vitals Discharge Condition: Stable Temperature (F): 98 Ambulatory Status: Walker Pulse (bpm): 74 Discharge Destination: Home Respiratory Rate (breaths/min): 20 Transportation: Private Auto Blood Pressure (mmHg): 131/71 Accompanied By: son Schedule Follow-up Appointment: Yes Clinical Summary of Care: Electronic Signature(s) Signed: 06/15/2022 5:13:32 PM By: [name] RN, BSN Entered By: [name] on 06/15/2022 09:16:24 -------------------------------------------------------------------------------- Lower Extremity Assessment Details Patient Name: Date of Service: Brandy Busing RO [name] 06/15/2022 9:00 A M Medical Record Number: [id number] Patient Account Number: [id number] Date of Birth/Sex: Treating RN: 02-04-1946 (76 y.o. [name] Primary Care Skylar Priest: [name] Other Clinician: Referring Azure Budnick: Treating Desi Carby/Extender: Hollie Beach in Treatment: 3 Edema Assessment Assessed: [name]: Yes] [Right: No] Edema: [Left: Yes] [Right: Yes] Calf Left: Right: Point of Measurement: 41 cm From Medial Instep 43 cm Ankle Left: Right: Point of Measurement: 8 cm From Medial Instep 23 cm Vascular Assessment Pulses: Dorsalis Pedis Palpable: [Left:Yes] Electronic Signature(s) Signed: 06/15/2022 5:13:32 PM By: [name] RN, BSN Entered By: [name] on 06/15/2022 09:04:47 -------------------------------------------------------------------------------- Multi Wound Chart Details Patient Name: Date of Service: [name], Brandy RO [name] 06/15/2022 9:00 A M Medical Record Number: [id number] Patient Account Number: [id number] Date of Birth/Sex: Treating RN: [birth year] (76 y.o. [name], [name] Primary Care Aarthi Uyeno: [name] Other Clinician: Referring Kalle Bernath: Treating Zelene Barga/Extender: Hollie Beach in  Treatment: 3 Vital Signs Height(in): 57 Pulse(bpm): 57 Weight(lbs): Blood Pressure(mmHg): 131/71 Body Mass Index(BMI): Temperature(F): 98 Respiratory Rate(breaths/min): 20 Photos: [1:Left, Lateral Lower Leg] [N/A:N/A N/A] Wound Location: [1:Laceration] [N/A:N/A] Wounding Event: [1:Venous Leg Ulcer] [N/A:N/A] Primary Etiology: [1:Lymphedema] [N/A:N/A] Secondary Etiology: [1:Cataracts, Anemia, Congestive Heart N/A] Comorbid History: [1:Failure, Hypertension, Osteoarthritis 04/24/2022] [N/A:N/A] Date Acquired: [1:3] [N/A:N/A] Weeks of Treatment: [1:Open] [N/A:N/A] Wound Status: [1:No] [N/A:N/A] Wound Recurrence: [1:0.3x1.8x0.2] [N/A:N/A] Measurements L x W x D (cm) [1:0.424] [N/A:N/A] A (cm) : rea [1:0.085] [  N/A:N/A] Volume (cm) : [1:74.30%] [N/A:N/A] % Reduction in A [1:rea: 82.80%] [N/A:N/A] % Reduction in Volume: [1:Full Thickness Without Exposed] [N/A:N/A] Classification: [1:Support Structures Medium] [N/A:N/A] Exudate A mount: [1:Serosanguineous] [N/A:N/A] Exudate Type: [1:red, brown] [N/A:N/A] Exudate Color: [1:Distinct, outline attached] [N/A:N/A] Wound Margin: [1:Medium (34-66%)] [N/A:N/A] Granulation A mount: [1:Red] [N/A:N/A] Granulation Quality: [1:Medium (34-66%)] [N/A:N/A] Necrotic A mount: [1:Fat Layer (Subcutaneous Tissue): Yes N/A] Exposed Structures: [1:Fascia: No Tendon: No Muscle: No Joint: No Bone: No Medium (34-66%)] [N/A:N/A] Epithelialization: [1:Debridement - Excisional] [N/A:N/A] Debridement: Pre-procedure Verification/Time Out 09:10 [N/A:N/A] Taken: [1:Lidocaine 5% topical ointment] [N/A:N/A] Pain Control: [1:Subcutaneous, Slough] [N/A:N/A] Tissue Debrided: [1:Skin/Subcutaneous Tissue] [N/A:N/A] Level: [1:0.54] [N/A:N/A] Debridement A (sq cm): [1:rea Curette] [N/A:N/A] Instrument: [1:Minimum] [N/A:N/A] Bleeding: [1:Pressure] [N/A:N/A] Hemostasis A chieved: [1:0] [N/A:N/A] Procedural Pain: [1:0] [N/A:N/A] Post Procedural Pain: [1:Procedure was  tolerated well] [N/A:N/A] Debridement Treatment Response: [1:0.3x1.8x0.2] [N/A:N/A] Post Debridement Measurements L x W x D (cm) [1:0.085] [N/A:N/A] Post Debridement Volume: (cm) [1:Debridement] [N/A:N/A] Treatment Notes Wound #1 (Lower Leg) Wound Laterality: Left, Lateral Cleanser Peri-Wound Care Skin Prep Discharge Instruction: Use skin prep as directed Sween Lotion (Moisturizing lotion) Discharge Instruction: Apply moisturizing lotion as directed Topical Primary Dressing MediHoney Gel, tube 1.5 (oz) Discharge Instruction: Apply to wound bed as instructed Secondary Dressing Zetuvit Plus Silicone Border Dressing 4x4 (in/in) Discharge Instruction: Apply silicone border over primary dressing as directed. Secured With Compression Wrap 20-30 mmHg compression stocking Discharge Instruction: apply in the morning and remove at night. Compression Stockings Add-Ons Electronic Signature(s) Signed: 06/15/2022 10:13:31 AM By: Kalman Shan DO Signed: 06/15/2022 5:13:32 PM By: [name] RN, BSN Entered By: Kalman Shan on 06/15/2022 09:36:18 -------------------------------------------------------------------------------- Multi-Disciplinary Care Plan Details Patient Name: Date of Service: [name], Brandy RO [name] 06/15/2022 9:00 A M Medical Record Number: [id number] Patient Account Number: [id number] Date of Birth/Sex: Treating RN: Jul 21, 1946 (76 y.o. [name], [name] Primary Care Oneita Allmon: [name] Other Clinician: Referring Corey Laski: Treating Tomorrow Dehaas/Extender: Hollie Beach in Treatment: 3 Active Inactive Pain, Acute or Chronic Nursing Diagnoses: Pain, acute or chronic: actual or potential Potential alteration in comfort, pain Goals: Patient will verbalize adequate pain control and receive pain control interventions during procedures as needed Date Initiated: 05/25/2022 Target Resolution Date: 06/26/2022 Goal Status: Active Patient/caregiver will  verbalize comfort level met Date Initiated: 05/25/2022 Target Resolution Date: 06/26/2022 Goal Status: Active Interventions: Complete pain assessment as per visit requirements Encourage patient to take pain medications as prescribed Provide education on pain management Treatment Activities: Administer pain control measures as ordered : 05/25/2022 Notes: Venous Leg Ulcer Nursing Diagnoses: Knowledge deficit related to disease process and management Goals: Non-invasive venous studies are completed as ordered Date Initiated: 05/25/2022 Target Resolution Date: 06/26/2022 Goal Status: Active Interventions: Assess peripheral edema status every visit. Provide education on venous insufficiency Treatment Activities: Non-invasive vascular studies : 05/25/2022 Notes: Wound/Skin Impairment Nursing Diagnoses: Knowledge deficit related to ulceration/compromised skin integrity Goals: Patient/caregiver will verbalize understanding of skin care regimen Date Initiated: 05/25/2022 Target Resolution Date: 06/26/2022 Goal Status: Active Ulcer/skin breakdown will heal within 14 weeks Date Initiated: 05/25/2022 Target Resolution Date: 08/21/2022 Goal Status: Active Interventions: Assess patient/caregiver ability to obtain necessary supplies Assess patient/caregiver ability to perform ulcer/skin care regimen upon admission and as needed Provide education on ulcer and skin care Treatment Activities: Skin care regimen initiated : 05/25/2022 Topical wound management initiated : 05/25/2022 Notes: Electronic Signature(s) Signed: 06/15/2022 5:13:32 PM By: [name] RN, BSN Entered By: [name] on 06/15/2022 09:07:09 -------------------------------------------------------------------------------- Pain Assessment Details Patient Name: Date of  Service: [name], Brandy RO [name] 06/15/2022 9:00 A M Medical Record Number: [id number] Patient Account Number: [id number] Date of Birth/Sex: Treating RN: 09/16/1946 (76  y.o. [name] Primary Care Tashanda Fuhrer: [name] Other Clinician: Referring Mariavictoria Nottingham: Treating Blayne Garlick/Extender: Hollie Beach in Treatment: 3 Active Problems Location of Pain Severity and Description of Pain Patient Has Paino No Site Locations Rate the pain. Current Pain Level: 0 Pain Management and Medication Current Pain Management: Medication: No Cold Application: No Rest: No Massage: No Activity: No T.E.N.S.: No Heat Application: No Leg drop or elevation: No Is the Current Pain Management Adequate: Adequate How does your wound impact your activities of daily livingo Sleep: No Bathing: No Appetite: No Relationship With Others: No Bladder Continence: No Emotions: No Bowel Continence: No Work: No Toileting: No Drive: No Dressing: No Hobbies: No Engineer, maintenance) Signed: 06/15/2022 5:13:32 PM By: [name] RN, BSN Entered By: [name] on 06/15/2022 09:04:35 -------------------------------------------------------------------------------- Patient/Caregiver Education Details Patient Name: Date of Service: Brandy Busing RO [name] 8/28/2023andnbsp9:00 A M Medical Record Number: [id number] Patient Account Number: [id number] Date of Birth/Gender: Treating RN: Mar 31, 1946 (76 y.o. [name] Primary Care Physician: [name] Other Clinician: Referring Physician: Treating Physician/Extender: Hollie Beach in Treatment: 3 Education Assessment Education Provided To: Patient Education Topics Provided Wound/Skin Impairment: Handouts: Skin Care Do's and Dont's Methods: Explain/Verbal Responses: Reinforcements needed Electronic Signature(s) Signed: 06/15/2022 5:13:32 PM By: [name] RN, BSN Entered By: [name] on 06/15/2022 09:07:21 -------------------------------------------------------------------------------- Wound Assessment Details Patient Name: Date of Service: [name], Brandy RO  [name] 06/15/2022 9:00 A M Medical Record Number: [id number] Patient Account Number: [id number] Date of Birth/Sex: Treating RN: [birth year] (76 y.o. [name], [name] Primary Care Cloris Flippo: [name] Other Clinician: Referring Ahaana Rochette: Treating Alonni Heimsoth/Extender: Hollie Beach in Treatment: 3 Wound Status Wound Number: 1 Primary Venous Leg Ulcer Etiology: Wound Location: Left, Lateral Lower Leg Secondary Lymphedema Wounding Event: Laceration Etiology: Date Acquired: 04/24/2022 Wound Status: Open Weeks Of Treatment: 3 Comorbid Cataracts, Anemia, Congestive Heart Failure, Hypertension, Clustered Wound: No History: Osteoarthritis Photos Wound Measurements Length: (cm) 0.3 Width: (cm) 1.8 Depth: (cm) 0.2 Area: (cm) 0.424 Volume: (cm) 0.085 % Reduction in Area: 74.3% % Reduction in Volume: 82.8% Epithelialization: Medium (34-66%) Tunneling: No Undermining: No Wound Description Classification: Full Thickness Without Exposed Support Structures Wound Margin: Distinct, outline attached Exudate Amount: Medium Exudate Type: Serosanguineous Exudate Color: red, brown Foul Odor After Cleansing: No Slough/Fibrino Yes Wound Bed Granulation Amount: Medium (34-66%) Exposed Structure Granulation Quality: Red Fascia Exposed: No Necrotic Amount: Medium (34-66%) Fat Layer (Subcutaneous Tissue) Exposed: Yes Necrotic Quality: Adherent Slough Tendon Exposed: No Muscle Exposed: No Joint Exposed: No Bone Exposed: No Treatment Notes Wound #1 (Lower Leg) Wound Laterality: Left, Lateral Cleanser Peri-Wound Care Skin Prep Discharge Instruction: Use skin prep as directed Sween Lotion (Moisturizing lotion) Discharge Instruction: Apply moisturizing lotion as directed Topical Primary Dressing MediHoney Gel, tube 1.5 (oz) Discharge Instruction: Apply to wound bed as instructed Secondary Dressing Zetuvit Plus Silicone Border Dressing 4x4 (in/in) Discharge  Instruction: Apply silicone border over primary dressing as directed. Secured With Compression Wrap 20-30 mmHg compression stocking Discharge Instruction: apply in the morning and remove at night. Compression Stockings Add-Ons Electronic Signature(s) Signed: 06/15/2022 5:13:32 PM By: [name] RN, BSN Signed: 06/19/2022 1:10:23 PM By: Rhae Hammock RN Entered By: Rhae Hammock on 06/15/2022 09:05:27 -------------------------------------------------------------------------------- Vitals Details Patient Name: Date of Service: [name], Brandy RO [name] 06/15/2022 9:00  A M Medical Record Number: [id number] Patient Account Number: [id number] Date of Birth/Sex: Treating RN: 07-Jun-1946 (76 y.o. [name], [name] Primary Care Corey Laski: [name] Other Clinician: Referring Layia Walla: Treating Jacee Enerson/Extender: Hollie Beach in Treatment: 3 Vital Signs Time Taken: 08:58 Temperature (F): 98 Height (in): 63 Pulse (bpm): 74 Respiratory Rate (breaths/min): 20 Blood Pressure (mmHg): 131/71 Reference Range: 80 - 120 mg / dl Electronic Signature(s) Signed: 06/15/2022 5:13:32 PM By: [name] RN, BSN Entered By: [name] on 06/15/2022 09:04:27

## 2022-06-20 ENCOUNTER — Emergency Department (HOSPITAL_COMMUNITY): Payer: Medicare HMO

## 2022-06-20 ENCOUNTER — Other Ambulatory Visit: Payer: Self-pay

## 2022-06-20 ENCOUNTER — Encounter (HOSPITAL_COMMUNITY): Payer: Self-pay

## 2022-06-20 ENCOUNTER — Emergency Department (HOSPITAL_COMMUNITY)
Admission: EM | Admit: 2022-06-20 | Discharge: 2022-06-20 | Disposition: A | Discharge status: Home / Self Care | Payer: Medicare HMO | Attending: Emergency Medicine | Admitting: Emergency Medicine

## 2022-06-20 DIAGNOSIS — W19XXXA Unspecified fall, initial encounter: Secondary | ICD-10-CM | POA: Diagnosis not present

## 2022-06-20 DIAGNOSIS — Z79899 Other long term (current) drug therapy: Secondary | ICD-10-CM | POA: Diagnosis not present

## 2022-06-20 DIAGNOSIS — I10 Essential (primary) hypertension: Secondary | ICD-10-CM | POA: Diagnosis not present

## 2022-06-20 DIAGNOSIS — S8992XA Unspecified injury of left lower leg, initial encounter: Secondary | ICD-10-CM | POA: Diagnosis present

## 2022-06-20 DIAGNOSIS — I130 Hypertensive heart and chronic kidney disease with heart failure and stage 1 through stage 4 chronic kidney disease, or unspecified chronic kidney disease: Secondary | ICD-10-CM | POA: Insufficient documentation

## 2022-06-20 DIAGNOSIS — I6782 Cerebral ischemia: Secondary | ICD-10-CM | POA: Diagnosis not present

## 2022-06-20 DIAGNOSIS — E1122 Type 2 diabetes mellitus with diabetic chronic kidney disease: Secondary | ICD-10-CM | POA: Diagnosis not present

## 2022-06-20 DIAGNOSIS — Z23 Encounter for immunization: Secondary | ICD-10-CM | POA: Insufficient documentation

## 2022-06-20 DIAGNOSIS — I509 Heart failure, unspecified: Secondary | ICD-10-CM | POA: Insufficient documentation

## 2022-06-20 DIAGNOSIS — S81812A Laceration without foreign body, left lower leg, initial encounter: Secondary | ICD-10-CM | POA: Diagnosis not present

## 2022-06-20 DIAGNOSIS — R519 Headache, unspecified: Secondary | ICD-10-CM | POA: Insufficient documentation

## 2022-06-20 DIAGNOSIS — S81811A Laceration without foreign body, right lower leg, initial encounter: Secondary | ICD-10-CM | POA: Insufficient documentation

## 2022-06-20 DIAGNOSIS — Z7982 Long term (current) use of aspirin: Secondary | ICD-10-CM | POA: Insufficient documentation

## 2022-06-20 DIAGNOSIS — M47812 Spondylosis without myelopathy or radiculopathy, cervical region: Secondary | ICD-10-CM | POA: Diagnosis not present

## 2022-06-20 DIAGNOSIS — N189 Chronic kidney disease, unspecified: Secondary | ICD-10-CM | POA: Insufficient documentation

## 2022-06-20 DIAGNOSIS — Z043 Encounter for examination and observation following other accident: Secondary | ICD-10-CM | POA: Diagnosis not present

## 2022-06-20 DIAGNOSIS — S098XXA Other specified injuries of head, initial encounter: Secondary | ICD-10-CM | POA: Diagnosis not present

## 2022-06-20 DIAGNOSIS — S0990XA Unspecified injury of head, initial encounter: Secondary | ICD-10-CM | POA: Diagnosis not present

## 2022-06-20 DIAGNOSIS — W010XXA Fall on same level from slipping, tripping and stumbling without subsequent striking against object, initial encounter: Secondary | ICD-10-CM | POA: Insufficient documentation

## 2022-06-20 DIAGNOSIS — M4312 Spondylolisthesis, cervical region: Secondary | ICD-10-CM | POA: Diagnosis not present

## 2022-06-20 MED ORDER — LIDOCAINE-EPINEPHRINE (PF) 2 %-1:200000 IJ SOLN
20.0000 mL | Freq: Once | INTRAMUSCULAR | Status: AC
  Administered 2022-06-20: 20 mL
  Filled 2022-06-20: qty 20

## 2022-06-20 MED ORDER — ACETAMINOPHEN 500 MG PO TABS
1000.0000 mg | ORAL_TABLET | Freq: Once | ORAL | Status: AC
  Administered 2022-06-20: 1000 mg via ORAL
  Filled 2022-06-20: qty 2

## 2022-06-20 MED ORDER — TETANUS-DIPHTH-ACELL PERTUSSIS 5-2.5-18.5 LF-MCG/0.5 IM SUSY
0.5000 mL | PREFILLED_SYRINGE | Freq: Once | INTRAMUSCULAR | Status: AC
Start: 2022-06-20 — End: 2022-06-20
  Administered 2022-06-20: 0.5 mL via INTRAMUSCULAR
  Filled 2022-06-20: qty 0.5

## 2022-06-20 MED ORDER — TETANUS-DIPHTHERIA TOXOIDS TD 5-2 LFU IM INJ: 0.5000 mL | INJECTION | Freq: Once | INTRAMUSCULAR | Status: DC

## 2022-06-20 MED ORDER — CEPHALEXIN 500 MG PO CAPS
500.0000 mg | ORAL_CAPSULE | Freq: Once | ORAL | Status: AC
  Administered 2022-06-20: 500 mg via ORAL
  Filled 2022-06-20: qty 1

## 2022-06-20 NOTE — ED Triage Notes (Signed)
EMS reports fall from standing onto both knees. Visible abrasions/lac to both legs. No LOC, head strike or blood thinners.  BP 168/84 HR 920 RR 16 Sp02 94 RA

## 2022-06-20 NOTE — Discharge Instructions (Signed)
You have been seen in the Emergency Department (ED) today for a laceration (cut). Please keep the cut clean but do not submerge it in the water.  Have your sutures removed in:  10 days  Please follow up with your doctor as needed regarding today's emergent visit. Take Tylenol and/or Ibuprofen as needed for pain.  Return to the ED or call your doctor if you notice any signs of infection such as fever >100.30F, increased pain, increased redness, pus, or other symptoms that concern you.   Scarring precautions:  Always keep your cut, scrape or other skin injury clean. Gently wash the area with mild soap and water to keep out germs and remove debris.  To help the injured skin heal, use petroleum jelly to keep the wound moist. Petroleum jelly prevents the wound from drying out and forming a scab; wounds with scabs take longer to heal. This will also help prevent a scar from getting too large, deep or itchy. As long as the wound is cleaned daily, it is not necessary to use anti-bacterial ointments.  After cleaning the wound and applying petroleum jelly or a similar ointment, cover the skin with an adhesive bandage. For large scrapes, sores, burns or persistent redness, it may be helpful to use hydrogel or silicone gel sheets.  Change your bandage daily to keep the wound clean while it heals. If you have skin that is sensitive to adhesives, try a non-adhesive gauze pad with paper tape. If using silicone gel or hydrogel sheets, follow the instructions on the package for changing the sheets.  If your injury requires stitches, follow your doctor's advice on how to care for the wound and when to get the stitches removed. This may help minimize the appearance of a scar.  Apply sunscreen to the wound after it has healed. Sun protection may help reduce red or brown discoloration and help the scar fade faster. Always use a broad-spectrum sunscreen with an SPF of 30 or higher and reapply frequently.

## 2022-06-20 NOTE — ED Provider Notes (Signed)
[facility] DEPT Provider Note   CSN: [id number] Arrival date & time: 06/20/22  1538     History  Chief Complaint  Patient presents with   [name] is a 76 y.o. female.  With PMH of DM 2, HTN, HLD, CKD, CHF, venous insufficiency followed by wound care for nonhealing left lower extremity wound from previous dog scratch who is BIB EMS from home after mechanical fall from her walker getting caught on something leading to her falling to the ground and sustaining lacerations to bilateral lower extremities.  Patient thinks she may have hit her head as she has a mild headache but did not lose consciousness.  She has been ambulatory since the fall.  She has no numbness, tingling, loss of sensation.  She has some pain to her legs where she sustained lacerations.  She had a laceration to her left thigh and right shin.  She is unsure when her last tetanus was.  She has had no vomiting or altered mental status and denies any preceding symptoms leading to the fall, no chest pain shortness of breath or dizziness.  She walks with a walker normally and when she was going to the bathroom she felt like it got caught on something leading to her to fall backwards and onto her left side hitting a cabinet on the way down.  HPI     Home Medications Prior to Admission medications   Medication Sig Start Date End Date Taking? Authorizing Provider  aspirin-acetaminophen-caffeine (EXCEDRIN MIGRAINE) [phone] MG tablet Take 1 tablet by mouth every 6 (six) hours as needed for headache.    [provider]  atorvastatin (LIPITOR) 40 MG tablet Take 1 tablet (40 mg total) by mouth daily. 07/16/21   Biagio Borg, MD  azelastine (ASTELIN) 0.1 % nasal spray Place 1 spray into both nostrils 2 (two) times daily. Use in each nostril as directed 05/29/21   Baird Lyons D, MD  azelastine (OPTIVAR) 0.05 % ophthalmic solution Place 1 drop into both eyes 2 (two) times  daily. 01/21/22   Biagio Borg, MD  betamethasone, augmented, (DIPROLENE) 0.05 % lotion Apply topically daily as needed. 12/26/21   [provider]  cetirizine (ZYRTEC) 10 MG tablet Take 1 tablet (10 mg total) by mouth daily. As needed Patient taking differently: Take 10 mg by mouth daily as needed for allergies. As needed 04/24/13   Biagio Borg, MD  Cholecalciferol 50 MCG (2000 UT) TABS 1 tab by mouth once daily 07/14/21   Biagio Borg, MD  clindamycin (CLEOCIN) 300 MG capsule Take 1 capsule (300 mg total) by mouth 3 (three) times daily. 05/15/22   Biagio Borg, MD  diltiazem (DILACOR XR) 240 MG 24 hr capsule TAKE 1 CAPSULE EVERY DAY 09/18/21   Biagio Borg, MD  DULoxetine (CYMBALTA) 20 MG capsule TAKE 2 CAPSULES EVERY DAY 03/30/22   Biagio Borg, MD  ferrous sulfate 325 (65 FE) MG tablet Take 1 tablet (325 mg total) by mouth 2 (two) times daily with a meal. 08/23/20   Antonieta Pert, MD  furosemide (LASIX) 80 MG tablet TAKE 1 TABLET TWICE DAILY 08/29/21   Biagio Borg, MD  gabapentin (NEURONTIN) 100 MG capsule TAKE 2 CAPSULES BY MOUTH AT BEDTIME 09/18/21   Biagio Borg, MD  HYDROcodone-acetaminophen (NORCO/VICODIN) 5-325 MG tablet Take 0.5 tablets by mouth daily as needed. 06/24/21   [provider]  hydrOXYzine (ATARAX) 10 MG tablet Take 1  tablet (10 mg total) by mouth at bedtime as needed for itching. 04/30/22   Ailene Ards, NP  losartan (COZAAR) 100 MG tablet Take 1 tablet (100 mg total) by mouth daily. Annual appt due in Sept must see provider for future refills 05/13/22   Biagio Borg, MD  metoprolol tartrate (LOPRESSOR) 50 MG tablet TAKE 1 TABLET TWICE DAILY 03/30/22   Biagio Borg, MD  Omadacycline Tosylate (NUZYRA) 150 MG TABS Take 2 tablets by mouth daily. 05/07/22   Ailene Ards, NP  ondansetron (ZOFRAN) 4 MG tablet Take 1 tablet (4 mg total) by mouth every 8 (eight) hours as needed for nausea or vomiting. 11/20/21   Biagio Borg, MD  pantoprazole (PROTONIX) 40 MG tablet TAKE 1  TABLET(40 MG) BY MOUTH DAILY 05/29/22   Pyrtle, Lajuan Lines, MD  Polyethyl Glycol-Propyl Glycol (SYSTANE OP) Place 1 drop into both eyes 3 (three) times daily as needed (dry eyes).    [provider]  Polyethylene Glycol 3350 (MIRALAX PO) Take 17 g by mouth daily as needed (constipation).     [provider]  potassium chloride (KLOR-CON M) 10 MEQ tablet TAKE 1 TABLET EVERY DAY 09/18/21   Biagio Borg, MD  tiZANidine (ZANAFLEX) 2 MG tablet Take 1 tablet (2 mg total) by mouth every 6 (six) hours as needed for muscle spasms. 08/14/21   Biagio Borg, MD  triamcinolone (NASACORT) 55 MCG/ACT AERO nasal inhaler Place 2 sprays into the nose daily. 01/21/22   Biagio Borg, MD  triamcinolone cream (KENALOG) 0.1 % Apply 1 application topically 2 (two) times daily. 07/12/20   Marrian Salvage, FNP  vitamin B-12 (CYANOCOBALAMIN) 1000 MCG tablet Take 1,000 mcg by mouth daily.    [provider]      Allergies    Doxycycline, Oxycodone, Soybean-containing drug products, and Sulfonamide derivatives    Review of Systems   Review of Systems  Physical Exam Updated Vital Signs BP (!) 141/78   Pulse (!) 59   Temp 97.7 F (36.5 C)   Resp 18   SpO2 100%  Physical Exam Constitutional: Alert and oriented. Well appearing and in no distress. Eyes: Conjunctivae are normal. ENT      Head: Normocephalic and atraumatic.      Nose: No congestion.      Mouth/Throat: Mucous membranes are moist.      Neck: No stridor.  No midline tenderness step-offs or deformities Cardiovascular: S1, S2, capillary refill less than 2 seconds in extremities, warm and dry, regular rate Respiratory: Normal respiratory effort. Breath sounds are normal.  O2 sat 100 on RA. Gastrointestinal: Soft and nontender.  Musculoskeletal: Normal range of motion in all extremities.      Right lower leg: A irregular V-shaped laceration of the right anterior shin with surrounding ecchymoses ~ 6 cm with subcutaneous tissue  exposed, hemostatic      Left lower leg: Left distal anterior thigh laceration horizontal and linear ~ 10 cm hemostatic with subcutaneous tissue exposure Neurologic: Normal speech and language.  Moving all extremities equally.  Sensation grossly intact.  No facial droop.  No gross focal neurologic deficits are appreciated. Skin: Skin is warm.  See musculoskeletal exam of lower extremity Psychiatric: Mood and affect are normal. Speech and behavior are normal.  ED Results / Procedures / Treatments   Labs (all labs ordered are listed, but only abnormal results are displayed) Labs Reviewed - No data to display  EKG None  Radiology CT Head Wo  Contrast  Result Date: 06/20/2022 CLINICAL DATA:  Fall from standing.  Close head injury. EXAM: CT HEAD WITHOUT CONTRAST CT CERVICAL SPINE WITHOUT CONTRAST TECHNIQUE: Multidetector CT imaging of the head and cervical spine was performed following the standard protocol without intravenous contrast. Multiplanar CT image reconstructions of the cervical spine were also generated. RADIATION DOSE REDUCTION: This exam was performed according to the departmental dose-optimization program which includes automated exposure control, adjustment of the mA and/or kV according to patient size and/or use of iterative reconstruction technique. COMPARISON:  None Available. FINDINGS: CT HEAD FINDINGS Brain: There is no evidence for acute hemorrhage, hydrocephalus, mass lesion, or abnormal extra-axial fluid collection. No definite CT evidence for acute infarction. Diffuse loss of parenchymal volume is consistent with atrophy. Patchy low attenuation in the deep hemispheric and periventricular white matter is nonspecific, but likely reflects chronic microvascular ischemic demyelination. Vascular: No hyperdense vessel or unexpected calcification. Skull: No evidence for fracture. No worrisome lytic or sclerotic lesion. Sinuses/Orbits: The visualized paranasal sinuses and mastoid air cells  are clear. Visualized portions of the globes and intraorbital fat are unremarkable. Other: None. CT CERVICAL SPINE FINDINGS Alignment: Reversal of normal cervical lordosis. Trace anterolisthesis at C3 on 4 is compatible with the facet degeneration at the same level. Skull base and vertebrae: No acute fracture. No primary bone lesion or focal pathologic process. Soft tissues and spinal canal: No prevertebral fluid or swelling. No visible canal hematoma. Disc levels: Mild loss of disc height noted C5-6 and C6-7 with endplate spurring at both levels. Upper chest: Unremarkable. Other: Non. IMPRESSION: 1. No acute intracranial abnormality. 2. Atrophy with chronic small vessel ischemic disease. 3. No cervical spine fracture or subluxation. Reversal of cervical lordosis likely related to patient positioning although muscle spasm or soft tissue injury could produce this appearance. 4. Degenerative changes in the cervical spine as above. Electronically Signed   By: Misty Stanley M.D.   On: 06/20/2022 16:43   CT Cervical Spine Wo Contrast  Result Date: 06/20/2022 CLINICAL DATA:  Fall from standing.  Close head injury. EXAM: CT HEAD WITHOUT CONTRAST CT CERVICAL SPINE WITHOUT CONTRAST TECHNIQUE: Multidetector CT imaging of the head and cervical spine was performed following the standard protocol without intravenous contrast. Multiplanar CT image reconstructions of the cervical spine were also generated. RADIATION DOSE REDUCTION: This exam was performed according to the departmental dose-optimization program which includes automated exposure control, adjustment of the mA and/or kV according to patient size and/or use of iterative reconstruction technique. COMPARISON:  None Available. FINDINGS: CT HEAD FINDINGS Brain: There is no evidence for acute hemorrhage, hydrocephalus, mass lesion, or abnormal extra-axial fluid collection. No definite CT evidence for acute infarction. Diffuse loss of parenchymal volume is consistent with  atrophy. Patchy low attenuation in the deep hemispheric and periventricular white matter is nonspecific, but likely reflects chronic microvascular ischemic demyelination. Vascular: No hyperdense vessel or unexpected calcification. Skull: No evidence for fracture. No worrisome lytic or sclerotic lesion. Sinuses/Orbits: The visualized paranasal sinuses and mastoid air cells are clear. Visualized portions of the globes and intraorbital fat are unremarkable. Other: None. CT CERVICAL SPINE FINDINGS Alignment: Reversal of normal cervical lordosis. Trace anterolisthesis at C3 on 4 is compatible with the facet degeneration at the same level. Skull base and vertebrae: No acute fracture. No primary bone lesion or focal pathologic process. Soft tissues and spinal canal: No prevertebral fluid or swelling. No visible canal hematoma. Disc levels: Mild loss of disc height noted C5-6 and C6-7 with endplate spurring at both  levels. Upper chest: Unremarkable. Other: Non. IMPRESSION: 1. No acute intracranial abnormality. 2. Atrophy with chronic small vessel ischemic disease. 3. No cervical spine fracture or subluxation. Reversal of cervical lordosis likely related to patient positioning although muscle spasm or soft tissue injury could produce this appearance. 4. Degenerative changes in the cervical spine as above. Electronically Signed   By: Misty Stanley M.D.   On: 06/20/2022 16:43   DG FEMUR MIN 2 VIEWS LEFT  Result Date: 06/20/2022 CLINICAL DATA:  Fall EXAM: LEFT FEMUR 2 VIEWS COMPARISON:  None Available. FINDINGS: There is no evidence of fracture or other focal bone lesions. 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 Soft tissues are unremarkable. IMPRESSION: 1. No fracture or dislocation of the left femur. 2. 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 Electronically Signed   By: Delanna Ahmadi M.D.   On: 06/20/2022 16:40   DG Tibia/Fibula Right  Result Date:  06/20/2022 CLINICAL DATA:  Laceration to leg after fall. EXAM: RIGHT TIBIA AND FIBULA - 2 VIEW COMPARISON:  None Available. FINDINGS: Status post tricompartmental knee replacement. No evidence for an acute fracture in the tibia or fibula. Ankle is not well assessed on this study. IMPRESSION: 1. No acute bony abnormality in this patient status post tricompartmental knee replacement. 2. Ankle is not well evaluated on this exam. If there is clinical concern for ankle injury, dedicated x-ray exam recommended. Electronically Signed   By: Misty Stanley M.D.   On: 06/20/2022 16:39    Procedures .Marland KitchenLaceration Repair  Date/Time: 06/20/2022 6:20 PM  Performed by: Elgie Congo, MD Authorized by: Elgie Congo, MD   Consent:    Consent obtained:  Verbal   Consent given by:  Patient   Risks discussed:  Infection, pain, poor cosmetic result and need for additional repair   Alternatives discussed:  No treatment Laceration details:    Location:  Leg   Leg location: left thigh.   Length (cm):  10 Pre-procedure details:    Preparation:  Patient was prepped and draped in usual sterile fashion Exploration:    Imaging obtained: x-ray     Imaging outcome: foreign body noted     Wound extent: fascia violated     Contaminated: no   Treatment:    Area cleansed with:  Saline   Amount of cleaning:  Extensive   Irrigation solution:  Sterile saline   Irrigation volume:  1000   Visualized foreign bodies/material removed: no   Skin repair:    Repair method:  Sutures   Suture size:  3-0   Suture material:  Prolene   Suture technique:  Simple interrupted   Number of sutures:  10 Approximation:    Approximation:  Loose Post-procedure details:    Dressing:  Antibiotic ointment and non-adherent dressing   Procedure completion:  Tolerated well, no immediate complications .Marland KitchenLaceration Repair  Date/Time: 06/20/2022 6:21 PM  Performed by: Elgie Congo, MD Authorized by: Elgie Congo, MD    Consent:    Consent obtained:  Verbal   Consent given by:  Patient   Risks discussed:  Infection, pain, poor cosmetic result, poor wound healing and need for additional repair Universal protocol:    Patient identity confirmed:  Verbally with patient Anesthesia:    Anesthesia method:  Local infiltration   Local anesthetic:  Lidocaine 2% WITH epi Laceration details:    Location:  Leg   Leg location: right tib/fib.   Length (cm):  7 Pre-procedure details:  Preparation:  Patient was prepped and draped in usual sterile fashion Exploration:    Imaging obtained: x-ray     Imaging outcome: foreign body not noted     Wound extent: fascia violated     Contaminated: no   Treatment:    Area cleansed with:  Saline   Amount of cleaning:  Extensive   Irrigation solution:  Sterile saline   Irrigation volume:  500   Irrigation method:  Pressure wash   Visualized foreign bodies/material removed: no   Skin repair:    Repair method:  Sutures   Suture size:  3-0   Suture material:  Prolene   Suture technique:  Simple interrupted   Number of sutures:  7 Approximation:    Approximation:  Loose Post-procedure details:    Dressing:  Antibiotic ointment   Procedure completion:  Tolerated well, no immediate complications    Medications Ordered in ED Medications  cephALEXin (KEFLEX) capsule 500 mg (500 mg Oral Given 06/20/22 1641)  acetaminophen (TYLENOL) tablet 1,000 mg (1,000 mg Oral Given 06/20/22 1641)  lidocaine-EPINEPHrine (XYLOCAINE W/EPI) 2 %-1:200000 (PF) injection 20 mL (20 mLs Other Given 06/20/22 1643)  Tdap (BOOSTRIX) injection 0.5 mL (0.5 mLs Intramuscular Given 06/20/22 1642)    ED Course/ Medical Decision Making/ A&P Clinical Course as of 06/20/22 1823  Sat Jun 20, 2022  1703 Personal interpretation of CT head shows no ICH.  Agree with radiology interpretation, no acute traumatic injury.  No acute traumatic injuries noted on CT head, C-spine, left femur or right tib-fib plain  films. [VB]  [id number] Patient discharged home with prophylactic Keflex and wound care precautions and close follow-up with wound care and PCP.  Strict return precaution discussed.  Safe for discharge home.  Patient and patient is in agreement with plan. [VB]    Clinical Course User Index [VB] Elgie Congo, MD                           Medical Decision Making Genevieve Arbaugh Rivard is a 76 y.o. female.  With PMH of DM 2, HTN, HLD, CKD, CHF, venous insufficiency followed by wound care for nonhealing left lower extremity wound from previous dog scratch who is BIB EMS from home after mechanical fall from her walker getting caught on something leading to her falling to the ground and sustaining lacerations to bilateral lower extremities.  Patient thinks she may have hit her head as she has a mild headache but did not lose consciousness.  Not on anticoagulation.  Patient had mechanical fall with closed head injury, no loss of consciousness as well as sustaining bilateral lower extremity lacerations as detailed in physical exam.  She does not require blood work or EKG as this was purely a mechanical fall and denies any preceding red flag signs or symptoms.  She has been ambulatory since the fall.  No focal neurologic deficits.  Plan to obtain CT head, C-spine, left femur x-ray and right tib-fib x-ray to evaluate for any traumatic injury.  Will repair lacerations and update tetanus.  Will put on prophylactic Keflex due to increased risk of infection from pmh and extensive wounds.    Amount and/or Complexity of Data Reviewed Radiology: ordered and independent interpretation performed. Decision-making details documented in ED Course.  Risk OTC drugs. Prescription drug management.    Final Clinical Impression(s) / ED Diagnoses Final diagnoses:  Fall, initial encounter  Laceration of left lower extremity, initial encounter  Laceration of right  lower extremity, initial encounter    Rx / DC  Orders ED Discharge Orders     None         Elgie Congo, MD 06/20/22 [phone]

## 2022-06-23 ENCOUNTER — Encounter (HOSPITAL_BASED_OUTPATIENT_CLINIC_OR_DEPARTMENT_OTHER): Payer: Medicare HMO | Attending: Internal Medicine | Admitting: Internal Medicine

## 2022-06-23 DIAGNOSIS — W548XXA Other contact with dog, initial encounter: Secondary | ICD-10-CM | POA: Insufficient documentation

## 2022-06-23 DIAGNOSIS — S81802A Unspecified open wound, left lower leg, initial encounter: Secondary | ICD-10-CM

## 2022-06-23 DIAGNOSIS — I509 Heart failure, unspecified: Secondary | ICD-10-CM | POA: Diagnosis not present

## 2022-06-23 DIAGNOSIS — I87312 Chronic venous hypertension (idiopathic) with ulcer of left lower extremity: Secondary | ICD-10-CM | POA: Diagnosis not present

## 2022-06-23 DIAGNOSIS — T798XXA Other early complications of trauma, initial encounter: Secondary | ICD-10-CM | POA: Diagnosis not present

## 2022-06-23 DIAGNOSIS — M1990 Unspecified osteoarthritis, unspecified site: Secondary | ICD-10-CM | POA: Diagnosis not present

## 2022-06-23 DIAGNOSIS — E11622 Type 2 diabetes mellitus with other skin ulcer: Secondary | ICD-10-CM | POA: Insufficient documentation

## 2022-06-23 DIAGNOSIS — J449 Chronic obstructive pulmonary disease, unspecified: Secondary | ICD-10-CM | POA: Diagnosis not present

## 2022-06-23 DIAGNOSIS — L97822 Non-pressure chronic ulcer of other part of left lower leg with fat layer exposed: Secondary | ICD-10-CM | POA: Diagnosis not present

## 2022-06-23 DIAGNOSIS — N183 Chronic kidney disease, stage 3 unspecified: Secondary | ICD-10-CM | POA: Insufficient documentation

## 2022-06-23 DIAGNOSIS — L97812 Non-pressure chronic ulcer of other part of right lower leg with fat layer exposed: Secondary | ICD-10-CM | POA: Diagnosis not present

## 2022-06-23 DIAGNOSIS — E1122 Type 2 diabetes mellitus with diabetic chronic kidney disease: Secondary | ICD-10-CM | POA: Insufficient documentation

## 2022-06-23 DIAGNOSIS — I872 Venous insufficiency (chronic) (peripheral): Secondary | ICD-10-CM | POA: Insufficient documentation

## 2022-06-23 DIAGNOSIS — S81801A Unspecified open wound, right lower leg, initial encounter: Secondary | ICD-10-CM

## 2022-06-23 DIAGNOSIS — I130 Hypertensive heart and chronic kidney disease with heart failure and stage 1 through stage 4 chronic kidney disease, or unspecified chronic kidney disease: Secondary | ICD-10-CM | POA: Insufficient documentation

## 2022-06-23 NOTE — Progress Notes (Signed)
[name], [name] ([id number]) Visit Report for 06/23/2022 Fall Risk Assessment Details Patient Name: Date of Service: [name], [name] [name] 06/23/2022 9:00 A M Medical Record Number: [id number] Patient Account Number: [id number] Date of Birth/Sex: Treating RN: 1946-10-12 (76 y.o. [name], [name] Primary Care Stephaie Dardis: [name] Other Clinician: Referring [place] Lesure: Treating Dominika Losey/Extender: Hollie Beach in Treatment: 4 Fall Risk Assessment Items Have you had 2 or more falls in the last 12 monthso 0 No Have you had any fall that resulted in injury in the last 12 monthso 0 Yes FALLS RISK SCREEN History of falling - immediate or within 3 months 25 Yes Secondary diagnosis (Do you have 2 or more medical diagnoseso) 0 No Ambulatory aid None/bed rest/wheelchair/nurse 0 No Crutches/cane/walker 15 Yes Furniture 0 No Intravenous therapy Access/Saline/Heparin Lock 0 No Gait/Transferring Normal/ bed rest/ wheelchair 0 No Weak (short steps with or without shuffle, stooped but able to lift head while walking, may seek 0 No support from furniture) Impaired (short steps with shuffle, may have difficulty arising from chair, head down, impaired 0 No balance) Mental Status Oriented to own ability 0 No Electronic Signature(s) Signed: 06/23/2022 4:25:50 PM By: Rhae Hammock RN Entered By: Rhae Hammock on 06/23/2022 09:19:27

## 2022-06-23 NOTE — Progress Notes (Signed)
Brandy, [name] ([id number]) Visit Report for 06/23/2022 Arrival Information Details Patient Name: Date of Service: [name] Brandy B. 06/23/2022 9:00 A M Medical Record Number: [id number] Patient Account Number: [id number] Date of Birth/Sex: Treating RN: Jan 18, 1946 (76 y.o. [name], [name] Primary Care Amberlee Garvey: [name] Other Clinician: Referring Jax Abdelrahman: Treating Jamieon Lannen/Extender: Hollie Beach in Treatment: 4 Visit Information History Since Last Visit Added or deleted any medications: No Patient Arrived: [name] Any new allergies or adverse reactions: No Arrival Time: 09:11 Had a fall or experienced change in Yes Accompanied By: son activities of daily living that may affect Transfer Assistance: Manual risk of falls: Patient Identification Verified: Yes Signs or symptoms of abuse/neglect since last visito No Secondary Verification Process Completed: Yes Hospitalized since last visit: No Patient Requires Transmission-Based Precautions: No Implantable device outside of the clinic excluding No Patient Has Alerts: No cellular tissue based products placed in the center since last visit: Has Dressing in Place as Prescribed: Yes Pain Present Now: Yes Electronic Signature(s) Signed: 06/23/2022 4:25:50 PM By: Rhae Hammock RN Entered By: Rhae Hammock on 06/23/2022 09:18:41 -------------------------------------------------------------------------------- Clinic Level of Care Assessment Details Patient Name: Date of Service: [name], [place] RO Brandy B. 06/23/2022 9:00 A M Medical Record Number: [id number] Patient Account Number: [id number] Date of Birth/Sex: Treating RN: 1946-09-15 (76 y.o. [name], Brandy.[name] Primary Care Aune Adami: [name] Other Clinician: Referring Shyteria Lewis: Treating Alizae Bechtel/Extender: Hollie Beach in Treatment: 4 Clinic Level of Care Assessment Items TOOL 3 Quantity Score X- 1 0 Use when EandM and  Procedure is performed on FOLLOW-UP visit ASSESSMENTS - Nursing Assessment / Reassessment X- 1 10 Reassessment of Co-morbidities (includes updates in patient status) X- 1 5 Reassessment of Adherence to Treatment Plan ASSESSMENTS - Wound and Skin Assessment / Reassessment _0  - Points for Wound Assessment can only be taken for a new wound of unknown or different etiology and a procedure is 0 NOT performed to that wound _1  - 0 Simple Wound Assessment / Reassessment - one wound X- 2 5 Complex Wound Assessment / Reassessment - multiple wounds _2  - 0 Dermatologic / Skin Assessment (not related to wound area) ASSESSMENTS - Focused Assessment X- 2 5 Circumferential Edema Measurements - multi extremities _3  - 0 Nutritional Assessment / Counseling / Intervention _4  - 0 Lower Extremity Assessment (monofilament, tuning fork, pulses) _5  - 0 Peripheral Arterial Disease Assessment (using hand held doppler) ASSESSMENTS - Ostomy and/or Continence Assessment and Care _6  - 0 Incontinence Assessment and Management _7  - 0 Ostomy Care Assessment and Management (repouching, etc.) PROCESS - Coordination of Care _8  - Points for Discharge Coordination can only be taken for a new wound of unknown or different etiology and a procedure 0 is NOT performed to that wound _9  - 0 Simple Patient / Family Education for ongoing care X- 1 20 Complex (extensive) Patient / Family Education for ongoing care X- 1 10 Staff obtains Programmer, systems, Records, T Results / Process Orders est _10  - 0 Staff telephones HHA, Nursing Homes / Clarify orders / etc _11  - 0 Routine Transfer to another Facility (non-emergent condition) _12  - 0 Routine Hospital Admission (non-emergent condition) _13  - 0 New Admissions / Biomedical engineer / Ordering NPWT Apligraf, etc. , _14  - 0 Emergency Hospital Admission (emergent condition) _15  - 0 Simple Discharge Coordination X- 1 15 Complex (extensive) Discharge Coordination PROCESS -  Special Needs _16  - 0 Pediatric / Minor Patient Management _17  - 0 Isolation Patient Management _18  - 0 Hearing /  Language / Visual special needs _0  - 0 Assessment of Community assistance (transportation, D/C planning, etc.) _1  - 0 Additional assistance / Altered mentation _2  - 0 Support Surface(s) Assessment (bed, cushion, seat, etc.) INTERVENTIONS - Wound Cleansing / Measurement _3  - Points for Wound Cleaning / Measurement, Wound Dressing, Specimen Collection and Specimen taken to lab can only 0 be taken for a new wound of unknown or different etiology and a procedure is NOT performed to that wound _4  - 0 Simple Wound Cleansing - one wound X- 2 5 Complex Wound Cleansing - multiple wounds X- 1 5 Wound Imaging (photographs - any number of wounds) _5  - 0 Wound Tracing (instead of photographs) _6  - 0 Simple Wound Measurement - one wound X- 2 5 Complex Wound Measurement - multiple wounds INTERVENTIONS - Wound Dressings _7  - 0 Small Wound Dressing one or multiple wounds X- 2 15 Medium Wound Dressing one or multiple wounds _8  - 0 Large Wound Dressing one or multiple wounds INTERVENTIONS - Miscellaneous _9  - 0 External ear exam _10  - 0 Specimen Collection (cultures, biopsies, blood, body fluids, etc.) _11  - 0 Specimen(s) / Culture(s) sent or taken to Lab for analysis _12  - 0 Patient Transfer (multiple staff / Civil Service fast streamer / Similar devices) _13  - 0 Simple Staple / Suture removal (25 or less) _14  - 0 Complex Staple / Suture removal (26 or more) _15  - 0 Hypo / Hyperglycemic Management (close monitor of Blood Glucose) _16  - 0 Ankle / Brachial Index (ABI) - do not check if billed separately X- 1 5 Vital Signs Has the patient been seen at the hospital within the last three years: Yes Total Score: 140 Level Of Care: New/Established - Level 4 Electronic Signature(s) Signed: 06/23/2022 6:08:07 PM By: [name] RN, BSN Entered By: [name] on 06/23/2022  10:00:05 -------------------------------------------------------------------------------- Encounter Discharge Information Details Patient Name: Date of Service: [name], CA RO Brandy B. 06/23/2022 9:00 A M Medical Record Number: [id number] Patient Account Number: [id number] Date of Birth/Sex: Treating RN: 19-Oct-1946 (76 y.o. [name], Brandy.[name] Primary Care Keyshia Orwick: [name] Other Clinician: Referring Zamantha Strebel: Treating Cailen Texeira/Extender: Hollie Beach in Treatment: 4 Encounter Discharge Information Items Post Procedure Vitals Discharge Condition: Stable Temperature (F): 98.7 Ambulatory Status: Walker Pulse (bpm): 77 Discharge Destination: Home Respiratory Rate (breaths/min): 20 Transportation: Private Auto Blood Pressure (mmHg): 154/74 Accompanied By: family member Schedule Follow-up Appointment: Yes Clinical Summary of Care: Electronic Signature(s) Signed: 06/23/2022 6:08:07 PM By: [name] RN, BSN Entered By: [name] on 06/23/2022 10:01:27 -------------------------------------------------------------------------------- Lower Extremity Assessment Details Patient Name: Date of Service: [name], CA RO Brandy B. 06/23/2022 9:00 A M Medical Record Number: [id number] Patient Account Number: [id number] Date of Birth/Sex: Treating RN: 1946/02/10 (76 y.o. [name], [name] Primary Care Othmar Ringer: [name] Other Clinician: Referring Shaquile Lutze: Treating Latacha Texeira/Extender: Hollie Beach in Treatment: 4 Edema Assessment Assessed: [name]: Yes] Patrice Paradise: Yes] Edema: [Left: Yes] [Right: Yes] Calf Left: Right: Point of Measurement: 41 cm From Medial Instep 46 cm 45.5 cm Ankle Left: Right: Point of Measurement: 8 cm From Medial Instep 26 cm 25 cm Vascular Assessment Pulses: Dorsalis Pedis Palpable: [Left:Yes] [Right:Yes] Posterior Tibial Palpable: [Left:Yes] [Right:Yes] Electronic Signature(s) Signed: 06/23/2022 4:25:50 PM By: Rhae Hammock RN Entered By: Rhae Hammock on 06/23/2022 09:13:53 -------------------------------------------------------------------------------- Multi Wound Chart Details Patient Name: Date of Service: [name], CA RO Brandy B. 06/23/2022 9:00 A M Medical Record Number: [id number] Patient Account Number: [id number] Date of Birth/Sex: Treating RN: [birth year] (76 y.o.  [name], Brandy.[name] Primary Care Taviana Westergren: [name] Other Clinician: Referring Hudsyn Champine: Treating Adilene Areola/Extender: Hollie Beach in Treatment: 4 Vital Signs Height(in): 31 Pulse(bpm): 69 Weight(lbs): Blood Pressure(mmHg): 154/74 Body Mass Index(BMI): Temperature(F): 98.7 Respiratory Rate(breaths/min): 17 Photos: Left, Lateral Lower Leg Left, Lateral Upper Leg Right, Lateral Lower Leg Wound Location: Laceration Trauma Trauma Wounding Event: Venous Leg Ulcer Trauma, Other Trauma, Other Primary Etiology: Lymphedema N/A N/A Secondary Etiology: Cataracts, Anemia, Congestive Heart Cataracts, Anemia, Congestive Heart Cataracts, Anemia, Congestive Heart Comorbid History: Failure, Hypertension, Osteoarthritis Failure, Hypertension, Osteoarthritis Failure, Hypertension, Osteoarthritis 04/24/2022 06/20/2022 06/20/2022 Date Acquired: 4 0 0 Weeks of Treatment: Healed - Epithelialized Open Open Wound Status: No No No Wound Recurrence: 0x0x0 1.5x11x0.2 5x10x0.2 Measurements L x W x D (cm) 0 12.959 39.27 A (cm) : rea 0 2.592 7.854 Volume (cm) : 100.00% 90.00% 0.00% % Reduction in Area: 100.00% 90.00% 0.00% % Reduction in Volume: Full Thickness Without Exposed Full Thickness With Exposed Support Full Thickness With Exposed Support Classification: Support Structures Structures Structures Medium Medium Medium Exudate Amount: Serosanguineous Serosanguineous Serosanguineous Exudate Type: red, brown red, brown red, brown Exudate Color: Distinct, outline attached Distinct, outline attached Distinct,  outline attached Wound Margin: Medium (34-66%) Medium (34-66%) Large (67-100%) Granulation Amount: Red Red, Pink Red, Pink Granulation Quality: Medium (34-66%) Medium (34-66%) Small (1-33%) Necrotic Amount: Fat Layer (Subcutaneous Tissue): Yes Fat Layer (Subcutaneous Tissue): Yes Fat Layer (Subcutaneous Tissue): Yes Exposed Structures: Fascia: No Fascia: No Fascia: No Tendon: No Tendon: No Tendon: No Muscle: No Muscle: No Muscle: No Joint: No Joint: No Joint: No Bone: No Bone: No Bone: No Medium (34-66%) None None Epithelialization: N/A Chemical/Enzymatic/Mechanical Chemical/Enzymatic/Mechanical Debridement: N/A N/A N/A Instrument: N/A None None Bleeding: Debridement Treatment Response: N/A Procedure was tolerated well Procedure was tolerated well Post Debridement Measurements L x N/A 1.5x11x0.1 5x10x0.1 W x D (cm) N/A 1.296 3.927 Post Debridement Volume: (cm) N/A 8 sutures 7;sutures Assessment Notes: N/A Debridement Debridement Procedures Performed: Treatment Notes Wound #1 (Lower Leg) Wound Laterality: Left, Lateral Cleanser Peri-Wound Care Topical Primary Dressing Secondary Dressing Secured With Compression Wrap Compression Stockings Add-Ons Wound #2 (Upper Leg) Wound Laterality: Left, Lateral Cleanser Peri-Wound Care Topical bactracin ointment Discharge Instruction: apply over the suture sites. Primary Dressing Secondary Dressing Zetuvit Plus Silicone Border Dressing 7x7(in/in) Discharge Instruction: Apply silicone border over primary dressing as directed. Secured With Compression Wrap Compression Stockings Add-Ons Wound #3 (Lower Leg) Wound Laterality: Right, Lateral Cleanser Peri-Wound Care Topical bactracin ointment Discharge Instruction: apply over the suture sites. Primary Dressing Secondary Dressing Zetuvit Plus Silicone Border Dressing 7x7(in/in) Discharge Instruction: Apply silicone border over primary dressing as  directed. Secured With Compression Wrap Compression Stockings Environmental education officer) Signed: 06/23/2022 12:26:30 PM By: Kalman Shan DO Signed: 06/23/2022 6:08:07 PM By: [name] RN, BSN Entered By: Kalman Shan on 06/23/2022 10:05:45 -------------------------------------------------------------------------------- [place] Details Patient Name: Date of Service: [name], CA RO Brandy B. 06/23/2022 9:00 A M Medical Record Number: [id number] Patient Account Number: [id number] Date of Birth/Sex: Treating RN: [birth year] (76 y.o. [name], Brandy.[name] Primary Care Celia Friedland: [name] Other Clinician: Referring Frida Wahlstrom: Treating Ineta Sinning/Extender: Hollie Beach in Treatment: 4 Active Inactive Pain, Acute or Chronic Nursing Diagnoses: Pain, acute or chronic: actual or potential Potential alteration in comfort, pain Goals: Patient will verbalize adequate pain control and receive pain control interventions during procedures as needed Date Initiated: 05/25/2022 Target Resolution Date: 06/26/2022 Goal Status: Active Patient/caregiver will verbalize comfort level met Date Initiated: 05/25/2022 Target Resolution Date: 06/26/2022  Goal Status: Active Interventions: Complete pain assessment as per visit requirements Encourage patient to take pain medications as prescribed Provide education on pain management Treatment Activities: Administer pain control measures as ordered : 05/25/2022 Notes: Venous Leg Ulcer Nursing Diagnoses: Knowledge deficit related to disease process and management Goals: Non-invasive venous studies are completed as ordered Date Initiated: 05/25/2022 Target Resolution Date: 06/26/2022 Goal Status: Active Interventions: Assess peripheral edema status every visit. Provide education on venous insufficiency Treatment Activities: Non-invasive vascular studies : 05/25/2022 Notes: Wound/Skin Impairment Nursing  Diagnoses: Knowledge deficit related to ulceration/compromised skin integrity Goals: Patient/caregiver will verbalize understanding of skin care regimen Date Initiated: 05/25/2022 Target Resolution Date: 06/26/2022 Goal Status: Active Ulcer/skin breakdown will heal within 14 weeks Date Initiated: 05/25/2022 Target Resolution Date: 08/21/2022 Goal Status: Active Interventions: Assess patient/caregiver ability to obtain necessary supplies Assess patient/caregiver ability to perform ulcer/skin care regimen upon admission and as needed Provide education on ulcer and skin care Treatment Activities: Skin care regimen initiated : 05/25/2022 Topical wound management initiated : 05/25/2022 Notes: Electronic Signature(s) Signed: 06/23/2022 6:08:07 PM By: [name] RN, BSN Entered By: [name] on 06/23/2022 09:46:55 -------------------------------------------------------------------------------- Pain Assessment Details Patient Name: Date of Service: [name], CA RO Brandy B. 06/23/2022 9:00 A M Medical Record Number: [id number] Patient Account Number: [id number] Date of Birth/Sex: Treating RN: [birth year] (76 y.o. [name], [name] Primary Care Gadiel John: [name] Other Clinician: Referring Ketsia Linebaugh: Treating Daiton Cowles/Extender: Hollie Beach in Treatment: 4 Active Problems Location of Pain Severity and Description of Pain Patient Has Paino Yes Site Locations Pain Location: Generalized Pain, Pain in Ulcers With Dressing Change: Yes Duration of the Pain. Constant / Intermittento Constant Rate the pain. Current Pain Level: 6 Worst Pain Level: 10 Least Pain Level: 0 Tolerable Pain Level: 6 Character of Pain Describe the Pain: Aching Pain Management and Medication Current Pain Management: Medication: No Cold Application: No Rest: No Massage: No Activity: No T.E.N.S.: No Heat Application: No Leg drop or elevation: No Is the Current Pain Management Adequate:  Adequate How does your wound impact your activities of daily livingo Sleep: No Bathing: No Appetite: No Relationship With Others: No Bladder Continence: No Emotions: No Bowel Continence: No Work: No Toileting: No Drive: No Dressing: No Hobbies: No Electronic Signature(s) Signed: 06/23/2022 4:25:50 PM By: Rhae Hammock RN Entered By: Rhae Hammock on 06/23/2022 09:12:07 -------------------------------------------------------------------------------- Patient/Caregiver Education Details Patient Name: Date of Service: [name] RO Brandy B. 9/5/2023andnbsp9:00 A M Medical Record Number: [id number] Patient Account Number: [id number] Date of Birth/Gender: Treating RN: 12/16/1945 (76 y.o. [name] Primary Care Physician: [name] Other Clinician: Referring Physician: Treating Physician/Extender: Hollie Beach in Treatment: 4 Education Assessment Education Provided To: Patient Education Topics Provided Wound/Skin Impairment: Handouts: Skin Care Do's and Dont's Methods: Explain/Verbal Responses: Reinforcements needed Electronic Signature(s) Signed: 06/23/2022 6:08:07 PM By: [name] RN, BSN Entered By: [name] on 06/23/2022 09:47:07 -------------------------------------------------------------------------------- Wound Assessment Details Patient Name: Date of Service: [name], CA RO Brandy B. 06/23/2022 9:00 A M Medical Record Number: [id number] Patient Account Number: [id number] Date of Birth/Sex: Treating RN: [birth year] (76 y.o. [name] Primary Care Adriahna Shearman: [name] Other Clinician: Referring Haleemah Buckalew: Treating [name]/Extender: Hollie Beach in Treatment: 4 Wound Status Wound Number: 1 Primary Venous Leg Ulcer Etiology: Wound Location: Left, Lateral Lower Leg Secondary Lymphedema Wounding Event: Laceration Etiology: Date Acquired: 04/24/2022 Wound Status: Healed - Epithelialized Weeks Of  Treatment: 4 Comorbid Cataracts, Anemia, Congestive Heart Failure, Hypertension, Clustered  Wound: No History: Osteoarthritis Photos Wound Measurements Length: (cm) Width: (cm) Depth: (cm) Area: (cm) Volume: (cm) 0 % Reduction in Area: 100% 0 % Reduction in Volume: 100% 0 Epithelialization: Medium (34-66%) 0 Tunneling: No 0 Undermining: No Wound Description Classification: Full Thickness Without Exposed Support Structures Wound Margin: Distinct, outline attached Exudate Amount: Medium Exudate Type: Serosanguineous Exudate Color: red, brown Foul Odor After Cleansing: No Slough/Fibrino Yes Wound Bed Granulation Amount: Medium (34-66%) Exposed Structure Granulation Quality: Red Fascia Exposed: No Necrotic Amount: Medium (34-66%) Fat Layer (Subcutaneous Tissue) Exposed: Yes Tendon Exposed: No Muscle Exposed: No Joint Exposed: No Bone Exposed: No Treatment Notes Wound #1 (Lower Leg) Wound Laterality: Left, Lateral Cleanser Peri-Wound Care Topical Primary Dressing Secondary Dressing Secured With Compression Wrap Compression Stockings Add-Ons Electronic Signature(s) Signed: 06/23/2022 6:08:07 PM By: [name] RN, BSN Entered By: [name] on 06/23/2022 09:50:13 -------------------------------------------------------------------------------- Wound Assessment Details Patient Name: Date of Service: [name], CA RO Brandy B. 06/23/2022 9:00 A M Medical Record Number: [id number] Patient Account Number: [id number] Date of Birth/Sex: Treating RN: 1945-12-28 (76 y.o. [name], [name] Primary Care Bobak Oguinn: [name] Other Clinician: Referring Teretha Chalupa: Treating Skyla Champagne/Extender: Hollie Beach in Treatment: 4 Wound Status Wound Number: 2 Primary Trauma, Other Etiology: Wound Location: Left, Lateral Upper Leg Wound Status: Open Wounding Event: Trauma Comorbid Cataracts, Anemia, Congestive Heart Failure, Hypertension, Date Acquired:  06/20/2022 History: Osteoarthritis Weeks Of Treatment: 0 Clustered Wound: No Photos Wound Measurements Length: (cm) 1.5 Width: (cm) 11 Depth: (cm) 0.2 Area: (cm) 12.959 Volume: (cm) 2.592 % Reduction in Area: 90% % Reduction in Volume: 90% Epithelialization: None Tunneling: No Undermining: No Wound Description Classification: Full Thickness With Exposed Support Structures Wound Margin: Distinct, outline attached Exudate Amount: Medium Exudate Type: Serosanguineous Exudate Color: red, brown Foul Odor After Cleansing: No Slough/Fibrino Yes Wound Bed Granulation Amount: Medium (34-66%) Exposed Structure Granulation Quality: Red, Pink Fascia Exposed: No Necrotic Amount: Medium (34-66%) Fat Layer (Subcutaneous Tissue) Exposed: Yes Necrotic Quality: Adherent Slough Tendon Exposed: No Muscle Exposed: No Joint Exposed: No Bone Exposed: No Assessment Notes 8 sutures Treatment Notes Wound #2 (Upper Leg) Wound Laterality: Left, Lateral Cleanser Peri-Wound Care Topical bactracin ointment Discharge Instruction: apply over the suture sites. Primary Dressing Secondary Dressing Zetuvit Plus Silicone Border Dressing 7x7(in/in) Discharge Instruction: Apply silicone border over primary dressing as directed. Secured With Compression Wrap Compression Stockings Environmental education officer) Signed: 06/23/2022 4:25:50 PM By: Rhae Hammock RN Signed: 06/23/2022 6:08:07 PM By: [name] RN, BSN Entered By: [name] on 06/23/2022 09:21:59 -------------------------------------------------------------------------------- Wound Assessment Details Patient Name: Date of Service: [name], CA RO Brandy B. 06/23/2022 9:00 A M Medical Record Number: [id number] Patient Account Number: [id number] Date of Birth/Sex: Treating RN: July 24, 1946 (76 y.o. [name], [name] Primary Care Cella Cappello: [name] Other Clinician: Referring Kendrea Cerritos: Treating Tamala Manzer/Extender: Hollie Beach in Treatment: 4 Wound Status Wound Number: 3 Primary Trauma, Other Etiology: Wound Location: Right, Lateral Lower Leg Wound Status: Open Wounding Event: Trauma Comorbid Cataracts, Anemia, Congestive Heart Failure, Hypertension, Date Acquired: 06/20/2022 History: Osteoarthritis Weeks Of Treatment: 0 Clustered Wound: No Photos Wound Measurements Length: (cm) 5 Width: (cm) 10 Depth: (cm) 0.2 Area: (cm) 39.27 Volume: (cm) 7.854 % Reduction in Area: 0% % Reduction in Volume: 0% Epithelialization: None Tunneling: No Undermining: No Wound Description Classification: Full Thickness With Exposed Support Structures Wound Margin: Distinct, outline attached Exudate Amount: Medium Exudate Type: Serosanguineous Exudate Color: red, brown Foul Odor After Cleansing: No Slough/Fibrino Yes Wound Bed Granulation Amount:  Large (67-100%) Exposed Structure Granulation Quality: Red, Pink Fascia Exposed: No Necrotic Amount: Small (1-33%) Fat Layer (Subcutaneous Tissue) Exposed: Yes Necrotic Quality: Adherent Slough Tendon Exposed: No Muscle Exposed: No Joint Exposed: No Bone Exposed: No Assessment Notes 7;sutures Treatment Notes Wound #3 (Lower Leg) Wound Laterality: Right, Lateral Cleanser Peri-Wound Care Topical bactracin ointment Discharge Instruction: apply over the suture sites. Primary Dressing Secondary Dressing Zetuvit Plus Silicone Border Dressing 7x7(in/in) Discharge Instruction: Apply silicone border over primary dressing as directed. Secured With Compression Wrap Compression Stockings Environmental education officer) Signed: 06/23/2022 4:25:50 PM By: Rhae Hammock RN Signed: 06/23/2022 6:08:07 PM By: [name] RN, BSN Entered By: [name] on 06/23/2022 09:22:29 -------------------------------------------------------------------------------- Vitals Details Patient Name: Date of Service: [name], CA RO Brandy B. 06/23/2022 9:00 A  M Medical Record Number: [id number] Patient Account Number: [id number] Date of Birth/Sex: Treating RN: 1946/10/17 (76 y.o. [name], [name] Primary Care Angle Dirusso: [name] Other Clinician: Referring Wolfgang Finigan: Treating Blakely Gluth/Extender: Hollie Beach in Treatment: 4 Vital Signs Time Taken: 09:11 Temperature (F): 98.7 Height (in): 63 Pulse (bpm): 77 Respiratory Rate (breaths/min): 17 Blood Pressure (mmHg): 154/74 Reference Range: 80 - 120 mg / dl Electronic Signature(s) Signed: 06/23/2022 4:25:50 PM By: Rhae Hammock RN Entered By: Rhae Hammock on 06/23/2022 09:19:01

## 2022-06-23 NOTE — Progress Notes (Signed)
[name], [name] ([id number]) Visit Report for 06/23/2022 Chief Complaint Document Details Patient Name: Date of Service: [name], [name] [name] 06/23/2022 9:00 A M Medical Record Number: [id number] Patient Account Number: [id number] Date of Birth/Sex: Treating RN: 05-27-1946 (76 y.o. [name] Primary Care Provider: [name] Other Clinician: Referring Provider: Treating Provider/Extender: Hollie Beach in Treatment: 4 Information Obtained from: Patient Chief Complaint 05/25/2022; left lower extremity wound following a dog scratch 06/23/2022; lacerations to the right and left lower extremity status post fall Electronic Signature(s) Signed: 06/23/2022 12:26:30 PM By: Kalman Shan DO Entered By: Kalman Shan on 06/23/2022 10:06:45 -------------------------------------------------------------------------------- Debridement Details Patient Name: Date of Service: [name], [place] RO [name] 06/23/2022 9:00 A M Medical Record Number: [id number] Patient Account Number: [id number] Date of Birth/Sex: Treating RN: 04/22/1946 (76 y.o. [name], [name] Primary Care Provider: [name] Other Clinician: Referring Provider: Treating Provider/Extender: Hollie Beach in Treatment: 4 Debridement Performed for Assessment: Wound #2 Left,Lateral Upper Leg Performed By: Clinician [name], RN Debridement Type: Chemical/Enzymatic/Mechanical Agent Used: gauze and wound cleanser Level of Consciousness (Pre-procedure): Awake and Alert Pre-procedure Verification/Time Out No Taken: Bleeding: None Response to Treatment: Procedure was tolerated well Level of Consciousness (Post- Awake and Alert procedure): Post Debridement Measurements of Total Wound Length: (cm) 1.5 Width: (cm) 11 Depth: (cm) 0.1 Volume: (cm) 1.296 Character of Wound/Ulcer Post Debridement: Stable Post Procedure Diagnosis Same as Pre-procedure Electronic Signature(s) Signed:  06/23/2022 12:26:30 PM By: Kalman Shan DO Signed: 06/23/2022 6:08:07 PM By: [name] RN, BSN Entered By: [name] on 06/23/2022 09:53:58 -------------------------------------------------------------------------------- Debridement Details Patient Name: Date of Service: [name], [place] RO [name] 06/23/2022 9:00 A M Medical Record Number: [id number] Patient Account Number: [id number] Date of Birth/Sex: Treating RN: Jul 17, 1946 (76 y.o. [name] Primary Care Provider: [name] Other Clinician: Referring Provider: Treating Provider/Extender: Hollie Beach in Treatment: 4 Debridement Performed for Assessment: Wound #3 Right,Lateral Lower Leg Performed By: Clinician [name], RN Debridement Type: Chemical/Enzymatic/Mechanical Agent Used: gauze and wound cleanser Level of Consciousness (Pre-procedure): Awake and Alert Pre-procedure Verification/Time Out No Taken: Bleeding: None Response to Treatment: Procedure was tolerated well Level of Consciousness (Post- Awake and Alert procedure): Post Debridement Measurements of Total Wound Length: (cm) 5 Width: (cm) 10 Depth: (cm) 0.1 Volume: (cm) 3.927 Character of Wound/Ulcer Post Debridement: Stable Post Procedure Diagnosis Same as Pre-procedure Electronic Signature(s) Signed: 06/23/2022 12:26:30 PM By: Kalman Shan DO Signed: 06/23/2022 6:08:07 PM By: [name] RN, BSN Entered By: [name] on 06/23/2022 09:54:14 -------------------------------------------------------------------------------- HPI Details Patient Name: Date of Service: [name], [place] RO [name] 06/23/2022 9:00 A M Medical Record Number: [id number] Patient Account Number: [id number] Date of Birth/Sex: Treating RN: July 14, 1946 (76 y.o. [name] Primary Care Provider: [name] Other Clinician: Referring Provider: Treating Provider/Extender: Hollie Beach in Treatment: 4 History of Present  Illness HPI Description: Admission 05/25/2022 [name] is a 76 year old female with a past medical history of diet-controlled type 2 diabetes, chronic 3 stage kidney disease and venous insufficiency that presents to the clinic for a 1 month history of nonhealing ulcer to the left leg. She states that her dog scratched her causing a wound. She has developed cellulitis in this leg and has been treated with clindamycin by her primary care physician. She reports improvement in her symptoms. She currently denies signs of infection. She has been keeping the area covered. She does not wear compression stockings.  She is on 80 mg of Lasix twice daily. She has had reflux studies to the left leg on 07/2021 that notes venous reflux throughout the left common femoral vein and greater saphenous vein. She has not had an ablation. She follows with vein and vascular for her venous insufficiency. 8/14; patient presents for follow-up. She states that the wrap stayed on for 4 days and eventually slid down. She has been wearing her compression stocking and doing dressing changes with Hydrofera Blue since. She also states she has a hard time putting on her shoes with the 3 layer compression. She denies signs of infection. 8/21; patient presents for follow-up. She again had trouble with the wrap sliding down. We have been using Hydrofera Blue with gentamicin under 2 layer Coflex. She currently denies signs of infection. 8/28; patient presents for follow-up. She reports taking the wrap off yesterday. We have been using Hydrofera Blue with gentamicin under 2 layer Coflex. She states that the wrap does slide down but remains over the wound. She is currently wearing her compression stocking. She would like to do this instead of the compression wrap. 9/5; patient presents for follow-up. She has been using Medihoney to the left lower extremity remedy wound with her compression stockings. She reports no drainage from the  site. Unfortunately she fell over the weekend and had to go to the ED due to lacerations she experienced on her left and right lower extremity. On the left leg she had 10 sutures placed on the right leg she had 7. She was given Keflex. Today she reports no signs of infection. She has been using Xeroform to the suture sites. Electronic Signature(s) Signed: 06/23/2022 12:26:30 PM By: Kalman Shan DO Entered By: Kalman Shan on 06/23/2022 10:07:53 -------------------------------------------------------------------------------- Physical Exam Details Patient Name: Date of Service: [name], [place] RO [name] 06/23/2022 9:00 A M Medical Record Number: [id number] Patient Account Number: [id number] Date of Birth/Sex: Treating RN: 05/31/1946 (76 y.o. [name] Primary Care Provider: [name] Other Clinician: Referring Provider: Treating Provider/Extender: Hollie Beach in Treatment: 4 Constitutional respirations regular, non-labored and within target range for patient.. Cardiovascular 2+ dorsalis pedis/posterior tibialis pulses. Psychiatric pleasant and cooperative. Notes Left lower extremity: T the lateral aspect there is epithelization to the previous wound site. T the thigh there is a laceration site with sutures in place. This is o o starting to dehisce. No increased warmth, erythema or purulent drainage. Right lower extremity: T the lateral aspect there is a laceration in the shape of the V with sutures in place. Again no increased warmth, erythema or purulent o drainage noted. 2+ pitting edema to the knee to both lower extremities Electronic Signature(s) Signed: 06/23/2022 12:26:30 PM By: Kalman Shan DO Entered By: Kalman Shan on 06/23/2022 10:09:12 -------------------------------------------------------------------------------- Physician Orders Details Patient Name: Date of Service: [name], [place] RO [name] 06/23/2022 9:00 A M Medical Record Number:  [id number] Patient Account Number: [id number] Date of Birth/Sex: Treating RN: [birth year] (76 y.o. [name] Primary Care Provider: [name] Other Clinician: Referring Provider: Treating Provider/Extender: Hollie Beach in Treatment: 4 Verbal / Phone Orders: No Diagnosis Coding ICD-10 Coding Code Description [phone] Chronic venous hypertension (idiopathic) with ulcer of left lower extremity W54.8XXA Other contact with dog, initial encounter [phone] Non-pressure chronic ulcer of other part of left lower leg with fat layer exposed E11.622 Type 2 diabetes mellitus with other skin ulcer Follow-up Appointments ppointment in 1 week. - Dr. Heber [place] and [name], Room  8 06/29/2022 0900 Tuesday Return A ppointment in 2 weeks. - Dr. Heber [place] and [place], Room 8 Tuesday Return A Other: - Leave sutures in place. purchase bactracin ointment apply over the suture sites. apply daily. Anesthetic (In clinic) Topical Lidocaine 5% applied to wound bed Bathing/ Shower/ Hygiene May shower with protection but do not get wound dressing(s) wet. Edema Control - Lymphedema / SCD / Other Elevate legs to the level of the heart or above for 30 minutes daily and/or when sitting, a frequency of: - 2-3 times a day throughout the day. Avoid standing for long periods of time. Exercise regularly Moisturize legs daily. - lotion both legs every night before bed. Compression stocking or Garment 20-30 mm/Hg pressure to: - apply in the morning and remove at night. Wound Treatment Wound #2 - Upper Leg Wound Laterality: Left, Lateral Topical: bactracin ointment 1 x Per Day Discharge Instructions: apply over the suture sites. Secondary Dressing: Zetuvit Plus Silicone Border Dressing 7x7(in/in) 1 x Per Day Discharge Instructions: Apply silicone border over primary dressing as directed. Wound #3 - Lower Leg Wound Laterality: Right, Lateral Topical: bactracin ointment 1 x Per Day Discharge  Instructions: apply over the suture sites. Secondary Dressing: Zetuvit Plus Silicone Border Dressing 7x7(in/in) 1 x Per Day Discharge Instructions: Apply silicone border over primary dressing as directed. Electronic Signature(s) Signed: 06/23/2022 12:26:30 PM By: Kalman Shan DO Entered By: Kalman Shan on 06/23/2022 10:09:18 -------------------------------------------------------------------------------- Problem List Details Patient Name: Date of Service: [name], [place] RO [name] 06/23/2022 9:00 A M Medical Record Number: [id number] Patient Account Number: [id number] Date of Birth/Sex: Treating RN: 05-Nov-1945 (76 y.o. [name] Primary Care Provider: [name] Other Clinician: Referring Provider: Treating Provider/Extender: Hollie Beach in Treatment: 4 Active Problems ICD-10 Encounter Code Description Active Date MDM Diagnosis I87.312 Chronic venous hypertension (idiopathic) with ulcer of left lower extremity 05/25/2022 No Yes W54.8XXA Other contact with dog, initial encounter 05/25/2022 No Yes L97.822 Non-pressure chronic ulcer of other part of left lower leg with fat layer 05/25/2022 No Yes exposed E11.622 Type 2 diabetes mellitus with other skin ulcer 05/25/2022 No Yes T79.8XXA Other early complications of trauma, initial encounter 06/23/2022 No Yes S81.801A Unspecified open wound, right lower leg, initial encounter 06/23/2022 No Yes S81.802A Unspecified open wound, left lower leg, initial encounter 06/23/2022 No Yes Inactive Problems Resolved Problems Electronic Signature(s) Signed: 06/23/2022 12:26:30 PM By: Kalman Shan DO Entered By: Kalman Shan on 06/23/2022 10:05:39 -------------------------------------------------------------------------------- Progress Note Details Patient Name: Date of Service: [name], [place] RO [name] 06/23/2022 9:00 A M Medical Record Number: [id number] Patient Account Number: [id number] Date of Birth/Sex: Treating  RN: 1946-03-03 (76 y.o. [name] Primary Care Provider: [name] Other Clinician: Referring Provider: Treating Provider/Extender: Hollie Beach in Treatment: 4 Subjective Chief Complaint Information obtained from Patient 05/25/2022; left lower extremity wound following a dog scratch 06/23/2022; lacerations to the right and left lower extremity status post fall History of Present Illness (HPI) Admission 05/25/2022 Ms. Jacqualynn Parco is a 76 year old female with a past medical history of diet-controlled type 2 diabetes, chronic 3 stage kidney disease and venous insufficiency that presents to the clinic for a 1 month history of nonhealing ulcer to the left leg. She states that her dog scratched her causing a wound. She has developed cellulitis in this leg and has been treated with clindamycin by her primary care physician. She reports improvement in her symptoms. She currently denies signs of infection. She has been keeping the  area covered. She does not wear compression stockings. She is on 80 mg of Lasix twice daily. She has had reflux studies to the left leg on 07/2021 that notes venous reflux throughout the left common femoral vein and greater saphenous vein. She has not had an ablation. She follows with vein and vascular for her venous insufficiency. 8/14; patient presents for follow-up. She states that the wrap stayed on for 4 days and eventually slid down. She has been wearing her compression stocking and doing dressing changes with Hydrofera Blue since. She also states she has a hard time putting on her shoes with the 3 layer compression. She denies signs of infection. 8/21; patient presents for follow-up. She again had trouble with the wrap sliding down. We have been using Hydrofera Blue with gentamicin under 2 layer Coflex. She currently denies signs of infection. 8/28; patient presents for follow-up. She reports taking the wrap off yesterday. We have been  using Hydrofera Blue with gentamicin under 2 layer Coflex. She states that the wrap does slide down but remains over the wound. She is currently wearing her compression stocking. She would like to do this instead of the compression wrap. 9/5; patient presents for follow-up. She has been using Medihoney to the left lower extremity remedy wound with her compression stockings. She reports no drainage from the site. Unfortunately she fell over the weekend and had to go to the ED due to lacerations she experienced on her left and right lower extremity. On the left leg she had 10 sutures placed on the right leg she had 7. She was given Keflex. Today she reports no signs of infection. She has been using Xeroform to the suture sites. Patient History Information obtained from Patient. Family History Cancer - Siblings, Diabetes - Siblings, Heart Disease - Mother, Hypertension - Mother, No family history of Hereditary Spherocytosis, Kidney Disease, Lung Disease, Seizures, Stroke, Thyroid Problems, Tuberculosis. Social History Never smoker, Marital Status - Married, Alcohol Use - Never, Drug Use - No History, Caffeine Use - Moderate. Medical History Eyes Patient has history of Cataracts Denies history of Glaucoma Ear/Nose/Mouth/Throat Denies history of Chronic sinus problems/congestion, Middle ear problems Hematologic/Lymphatic Patient has history of Anemia Denies history of Hemophilia, Human Immunodeficiency Virus, Lymphedema, Sickle Cell Disease Respiratory Denies history of Aspiration, Asthma, Chronic Obstructive Pulmonary Disease (COPD), Pneumothorax, Sleep Apnea, Tuberculosis Cardiovascular Patient has history of Congestive Heart Failure, Hypertension Denies history of Angina, Arrhythmia, Coronary Artery Disease, Deep Vein Thrombosis, Hypotension, Myocardial Infarction, Peripheral Arterial Disease, Peripheral Venous Disease, Phlebitis, Vasculitis Gastrointestinal Denies history of Cirrhosis ,  Colitis, Crohnoos, Hepatitis A, Hepatitis B, Hepatitis C Endocrine Denies history of Type I Diabetes, Type II Diabetes Genitourinary Denies history of End Stage Renal Disease Immunological Denies history of Lupus Erythematosus, Raynaudoos, Scleroderma Integumentary (Skin) Denies history of History of Burn Musculoskeletal Patient has history of Osteoarthritis Denies history of Gout, Rheumatoid Arthritis, Osteomyelitis Neurologic Denies history of Dementia, Neuropathy, Quadriplegia, Paraplegia, Seizure Disorder Oncologic Denies history of Received Chemotherapy Psychiatric Denies history of Anorexia/bulimia, Confinement Anxiety Hospitalization/Surgery History - right total knee replacement. Medical A Surgical History Notes nd Gastrointestinal diverticulosis hiatal hernia hyperlipidemia Musculoskeletal DJD Objective Constitutional respirations regular, non-labored and within target range for patient.. Vitals Time Taken: 9:11 AM, Height: 63 in, Temperature: 98.7 F, Pulse: 77 bpm, Respiratory Rate: 17 breaths/min, Blood Pressure: 154/74 mmHg. Cardiovascular 2+ dorsalis pedis/posterior tibialis pulses. Psychiatric pleasant and cooperative. General Notes: Left lower extremity: T the lateral aspect there is epithelization to the previous wound site. T the  thigh there is a laceration site with sutures in o o place. This is starting to dehisce. No increased warmth, erythema or purulent drainage. Right lower extremity: T the lateral aspect there is a laceration in the o shape of the V with sutures in place. Again no increased warmth, erythema or purulent drainage noted. 2+ pitting edema to the knee to both lower extremities Integumentary (Hair, Skin) Wound #1 status is Healed - Epithelialized. Original cause of wound was Laceration. The date acquired was: 04/24/2022. The wound has been in treatment 4 weeks. The wound is located on the Left,Lateral Lower Leg. The wound measures 0cm  length x 0cm width x 0cm depth; 0cm^2 area and 0cm^3 volume. There is Fat Layer (Subcutaneous Tissue) exposed. There is no tunneling or undermining noted. There is a medium amount of serosanguineous drainage noted. The wound margin is distinct with the outline attached to the wound base. There is medium (34-66%) red granulation within the wound bed. There is a medium (34- 66%) amount of necrotic tissue within the wound bed. Wound #2 status is Open. Original cause of wound was Trauma. The date acquired was: 06/20/2022. The wound is located on the Left,Lateral Upper Leg. The wound measures 1.5cm length x 11cm width x 0.2cm depth; 12.959cm^2 area and 2.592cm^3 volume. There is Fat Layer (Subcutaneous Tissue) exposed. There is no tunneling or undermining noted. There is a medium amount of serosanguineous drainage noted. The wound margin is distinct with the outline attached to the wound base. There is medium (34-66%) red, pink granulation within the wound bed. There is a medium (34-66%) amount of necrotic tissue within the wound bed including Adherent Slough. General Notes: 8 sutures Wound #3 status is Open. Original cause of wound was Trauma. The date acquired was: 06/20/2022. The wound is located on the Right,Lateral Lower Leg. The wound measures 5cm length x 10cm width x 0.2cm depth; 39.27cm^2 area and 7.854cm^3 volume. There is Fat Layer (Subcutaneous Tissue) exposed. There is no tunneling or undermining noted. There is a medium amount of serosanguineous drainage noted. The wound margin is distinct with the outline attached to the wound base. There is large (67-100%) red, pink granulation within the wound bed. There is a small (1-33%) amount of necrotic tissue within the wound bed including Adherent Slough. General Notes: 7;sutures Assessment Active Problems ICD-10 Chronic venous hypertension (idiopathic) with ulcer of left lower extremity Other contact with dog, initial encounter Non-pressure  chronic ulcer of other part of left lower leg with fat layer exposed Type 2 diabetes mellitus with other skin ulcer Other early complications of trauma, initial encounter Unspecified open wound, right lower leg, initial encounter Unspecified open wound, left lower leg, initial encounter Patient's original wound has healed. Unfortunately she has developed 2 lacerations 1 to each lower extremity after a fall. She had sutures placed by the ED on 9/2. These are not ready to come out today. For now recommended using bacitracin to the suture line and wearing daily compression stockings. I will likely take the sutures out at next clinic visit. Follow-up in 1 week. Procedures Wound #2 Pre-procedure diagnosis of Wound #2 is a Trauma, Other located on the Left,Lateral Upper Leg . There was a Chemical/Enzymatic/Mechanical debridement performed by [name], RN.. Other agent used was gauze and wound cleanser. There was no bleeding. The procedure was tolerated well. Post Debridement Measurements: 1.5cm length x 11cm width x 0.1cm depth; 1.296cm^3 volume. Character of Wound/Ulcer Post Debridement is stable. Post procedure Diagnosis Wound #2: Same as Pre-Procedure  Wound #3 Pre-procedure diagnosis of Wound #3 is a Trauma, Other located on the Right,Lateral Lower Leg . There was a Chemical/Enzymatic/Mechanical debridement performed by [name], RN.. Other agent used was gauze and wound cleanser. There was no bleeding. The procedure was tolerated well. Post Debridement Measurements: 5cm length x 10cm width x 0.1cm depth; 3.927cm^3 volume. Character of Wound/Ulcer Post Debridement is stable. Post procedure Diagnosis Wound #3: Same as Pre-Procedure Plan Follow-up Appointments: Return Appointment in 1 week. - Dr. Heber [place] and [place], Room 8 06/29/2022 0900 Tuesday Return Appointment in 2 weeks. - Dr. Heber [place] and [place], Room 8 Tuesday Other: - Leave sutures in place. purchase bactracin ointment apply over  the suture sites. apply daily. Anesthetic: (In clinic) Topical Lidocaine 5% applied to wound bed Bathing/ Shower/ Hygiene: May shower with protection but do not get wound dressing(s) wet. Edema Control - Lymphedema / SCD / Other: Elevate legs to the level of the heart or above for 30 minutes daily and/or when sitting, a frequency of: - 2-3 times a day throughout the day. Avoid standing for long periods of time. Exercise regularly Moisturize legs daily. - lotion both legs every night before bed. Compression stocking or Garment 20-30 mm/Hg pressure to: - apply in the morning and remove at night. WOUND #2: - Upper Leg Wound Laterality: Left, Lateral Topical: bactracin ointment 1 x Per Day/ Discharge Instructions: apply over the suture sites. Secondary Dressing: Zetuvit Plus Silicone Border Dressing 7x7(in/in) 1 x Per Day/ Discharge Instructions: Apply silicone border over primary dressing as directed. WOUND #3: - Lower Leg Wound Laterality: Right, Lateral Topical: bactracin ointment 1 x Per Day/ Discharge Instructions: apply over the suture sites. Secondary Dressing: Zetuvit Plus Silicone Border Dressing 7x7(in/in) 1 x Per Day/ Discharge Instructions: Apply silicone border over primary dressing as directed. 1. Bacitracin 2. Daily compression stockings 3. Follow-up in 1 week Electronic Signature(s) Signed: 06/23/2022 12:26:30 PM By: Kalman Shan DO Entered By: Kalman Shan on 06/23/2022 10:12:15 -------------------------------------------------------------------------------- HxROS Details Patient Name: Date of Service: [name], [place] RO [name] 06/23/2022 9:00 A M Medical Record Number: [id number] Patient Account Number: [id number] Date of Birth/Sex: Treating RN: 02-09-1946 (76 y.o. [name], [name] Primary Care Provider: [name] Other Clinician: Referring Provider: Treating Provider/Extender: Hollie Beach in Treatment: 4 Information Obtained  From Patient Eyes Medical History: Positive for: Cataracts Negative for: Glaucoma Ear/Nose/Mouth/Throat Medical History: Negative for: Chronic sinus problems/congestion; Middle ear problems Hematologic/Lymphatic Medical History: Positive for: Anemia Negative for: Hemophilia; Human Immunodeficiency Virus; Lymphedema; Sickle Cell Disease Respiratory Medical History: Negative for: Aspiration; Asthma; Chronic Obstructive Pulmonary Disease (COPD); Pneumothorax; Sleep Apnea; Tuberculosis Cardiovascular Medical History: Positive for: Congestive Heart Failure; Hypertension Negative for: Angina; Arrhythmia; Coronary Artery Disease; Deep Vein Thrombosis; Hypotension; Myocardial Infarction; Peripheral Arterial Disease; Peripheral Venous Disease; Phlebitis; Vasculitis Gastrointestinal Medical History: Negative for: Cirrhosis ; Colitis; Crohns; Hepatitis A; Hepatitis B; Hepatitis C Past Medical History Notes: diverticulosis hiatal hernia hyperlipidemia Endocrine Medical History: Negative for: Type I Diabetes; Type II Diabetes Genitourinary Medical History: Negative for: End Stage Renal Disease Immunological Medical History: Negative for: Lupus Erythematosus; Raynauds; Scleroderma Integumentary (Skin) Medical History: Negative for: History of Burn Musculoskeletal Medical History: Positive for: Osteoarthritis Negative for: Gout; Rheumatoid Arthritis; Osteomyelitis Past Medical History Notes: DJD Neurologic Medical History: Negative for: Dementia; Neuropathy; Quadriplegia; Paraplegia; Seizure Disorder Oncologic Medical History: Negative for: Received Chemotherapy Psychiatric Medical History: Negative for: Anorexia/bulimia; Confinement Anxiety HBO Extended History Items Eyes: Cataracts Immunizations Pneumococcal Vaccine: Received Pneumococcal Vaccination: Yes Received Pneumococcal Vaccination On  or After 60th Birthday: Yes Implantable Devices None Hospitalization /  Surgery History Type of Hospitalization/Surgery right total knee replacement Family and Social History Cancer: Yes - Siblings; Diabetes: Yes - Siblings; Heart Disease: Yes - Mother; Hereditary Spherocytosis: No; Hypertension: Yes - Mother; Kidney Disease: No; Lung Disease: No; Seizures: No; Stroke: No; Thyroid Problems: No; Tuberculosis: No; Never smoker; Marital Status - Married; Alcohol Use: Never; Drug Use: No History; Caffeine Use: Moderate; Financial Concerns: No; Food, Clothing or Shelter Needs: No; Support System Lacking: No; Transportation Concerns: No Electronic Signature(s) Signed: 06/23/2022 12:26:30 PM By: Kalman Shan DO Signed: 06/23/2022 6:08:07 PM By: [name] RN, BSN Entered By: Kalman Shan on 06/23/2022 10:07:59 -------------------------------------------------------------------------------- SuperBill Details Patient Name: Date of Service: [name], [place] RO [name] 06/23/2022 Medical Record Number: [id number] Patient Account Number: [id number] Date of Birth/Sex: Treating RN: 10/25/1945 (76 y.o. 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.622 Type 2 diabetes mellitus with other skin ulcer T79.8XXA Other early complications of trauma, initial encounter S81.801A Unspecified open wound, right lower leg, initial encounter S81.802A Unspecified open wound, left lower leg, initial encounter Facility Procedures CPT4 Code: [id number] Description: 99214 - WOUND CARE VISIT-LEV 4 EST PT Modifier: Quantity: 1 CPT4 Code: [id number] Description: 75916 - DEBRIDE W/O ANES NON  SELECT Modifier: Quantity: 1 Physician Procedures : CPT4 Code Description Modifier [id number] - WC PHYS LEVEL 3 - EST PT ICD-10 Diagnosis Description T79.8XXA Other early complications of trauma, initial encounter S81.801A Unspecified open wound, right lower leg, initial encounter S81.802A  Unspecified open wound, left lower leg, initial encounter L97.822 Non-pressure chronic ulcer of other part of left lower leg with fat layer exposed Quantity: 1 Electronic Signature(s) Signed: 06/23/2022 12:26:30 PM By: Kalman Shan DO Entered By: Kalman Shan on 06/23/2022 10:12:54

## 2022-06-29 ENCOUNTER — Encounter (HOSPITAL_BASED_OUTPATIENT_CLINIC_OR_DEPARTMENT_OTHER): Payer: Medicare HMO | Admitting: Internal Medicine

## 2022-06-29 DIAGNOSIS — S81802A Unspecified open wound, left lower leg, initial encounter: Secondary | ICD-10-CM | POA: Diagnosis not present

## 2022-06-29 DIAGNOSIS — L97812 Non-pressure chronic ulcer of other part of right lower leg with fat layer exposed: Secondary | ICD-10-CM | POA: Diagnosis not present

## 2022-06-29 DIAGNOSIS — N183 Chronic kidney disease, stage 3 unspecified: Secondary | ICD-10-CM | POA: Diagnosis not present

## 2022-06-29 DIAGNOSIS — S81801A Unspecified open wound, right lower leg, initial encounter: Secondary | ICD-10-CM | POA: Diagnosis not present

## 2022-06-29 DIAGNOSIS — M1990 Unspecified osteoarthritis, unspecified site: Secondary | ICD-10-CM | POA: Diagnosis not present

## 2022-06-29 DIAGNOSIS — I872 Venous insufficiency (chronic) (peripheral): Secondary | ICD-10-CM | POA: Diagnosis not present

## 2022-06-29 DIAGNOSIS — I130 Hypertensive heart and chronic kidney disease with heart failure and stage 1 through stage 4 chronic kidney disease, or unspecified chronic kidney disease: Secondary | ICD-10-CM | POA: Diagnosis not present

## 2022-06-29 DIAGNOSIS — E11622 Type 2 diabetes mellitus with other skin ulcer: Secondary | ICD-10-CM | POA: Diagnosis not present

## 2022-06-29 DIAGNOSIS — E1122 Type 2 diabetes mellitus with diabetic chronic kidney disease: Secondary | ICD-10-CM | POA: Diagnosis not present

## 2022-06-29 DIAGNOSIS — I509 Heart failure, unspecified: Secondary | ICD-10-CM | POA: Diagnosis not present

## 2022-06-29 DIAGNOSIS — L97822 Non-pressure chronic ulcer of other part of left lower leg with fat layer exposed: Secondary | ICD-10-CM | POA: Diagnosis not present

## 2022-06-29 NOTE — Progress Notes (Signed)
MAEVYN, RIORDAN ([id number]) Visit Report for 06/29/2022 Chief Complaint Document Details Patient Name: Date of Service: [name], [name] [name] 06/29/2022 9:00 A M Medical Record Number: [id number] Patient Account Number: [id number] Date of Birth/Sex: Treating RN: 07-Oct-1946 (76 y.o. F) Primary Care Provider: [name] Other Clinician: Referring Provider: Treating Provider/Extender: Hollie Beach in Treatment: 5 Information Obtained from: Patient Chief Complaint 05/25/2022; left lower extremity wound following a dog scratch 06/23/2022; lacerations to the right and left lower extremity status post fall Electronic Signature(s) Signed: 06/29/2022 10:59:00 AM By: Kalman Shan DO Entered By: Kalman Shan on 06/29/2022 09:18:26 -------------------------------------------------------------------------------- Debridement Details Patient Name: Date of Service: [name], [place] RO [name] 06/29/2022 9:00 A M Medical Record Number: [id number] Patient Account Number: [id number] Date of Birth/Sex: Treating RN: 1946-08-17 (76 y.o. [name], [name] Primary Care Provider: [name] Other Clinician: Referring Provider: Treating Provider/Extender: Hollie Beach in Treatment: 5 Debridement Performed for Assessment: Wound #3 Right,Lateral Lower Leg Performed By: Physician Kalman Shan, DO Debridement Type: Debridement Level of Consciousness (Pre-procedure): Awake and Alert Pre-procedure Verification/Time Out Yes - 09:00 Taken: Start Time: 09:01 Pain Control: Lidocaine 5% topical ointment T Area Debrided (L x W): otal 1 (cm) x 5.7 (cm) = 5.7 (cm) Tissue and other material debrided: Viable, Non-Viable, Eschar, Slough, Subcutaneous, Skin: Dermis , Skin: Epidermis, Slough Level: Skin/Subcutaneous Tissue Debridement Description: Excisional Instrument: Curette, Forceps, Scissors Bleeding: Minimum Hemostasis Achieved: Pressure End Time:  09:08 Procedural Pain: 0 Post Procedural Pain: 0 Response to Treatment: Procedure was tolerated well Level of Consciousness (Post- Awake and Alert procedure): Post Debridement Measurements of Total Wound Length: (cm) 3.5 Width: (cm) 5.7 Depth: (cm) 0.1 Volume: (cm) 1.567 Character of Wound/Ulcer Post Debridement: Improved Post Procedure Diagnosis Same as Pre-procedure Notes x7 sutures removed. Electronic Signature(s) Signed: 06/29/2022 10:59:00 AM By: Kalman Shan DO Signed: 06/29/2022 5:26:50 PM By: [name] RN, BSN Entered By: [name] on 06/29/2022 09:08:57 -------------------------------------------------------------------------------- Debridement Details Patient Name: Date of Service: [name] RO [name] 06/29/2022 9:00 A M Medical Record Number: [id number] Patient Account Number: [id number] Date of Birth/Sex: Treating RN: 13-Jan-1946 (76 y.o. [name], [name] Primary Care Provider: [name] Other Clinician: Referring Provider: Treating Provider/Extender: Hollie Beach in Treatment: 5 Debridement Performed for Assessment: Wound #2 Left,Lateral Upper Leg Performed By: Physician Kalman Shan, DO Debridement Type: Debridement Level of Consciousness (Pre-procedure): Awake and Alert Pre-procedure Verification/Time Out Yes - 09:00 Taken: Start Time: 09:01 Pain Control: Lidocaine 5% topical ointment T Area Debrided (L x W): otal 0.5 (cm) x 10 (cm) = 5 (cm) Tissue and other material debrided: Viable, Non-Viable, Eschar, Slough, Subcutaneous, Skin: Dermis , Skin: Epidermis, Slough Level: Skin/Subcutaneous Tissue Debridement Description: Excisional Instrument: Curette, Forceps, Scissors Bleeding: Minimum Hemostasis Achieved: Pressure End Time: 09:08 Procedural Pain: 0 Post Procedural Pain: 0 Response to Treatment: Procedure was tolerated well Level of Consciousness (Post- Awake and Alert procedure): Post Debridement  Measurements of Total Wound Length: (cm) 0.5 Width: (cm) 10 Depth: (cm) 0.1 Volume: (cm) 0.393 Character of Wound/Ulcer Post Debridement: Improved Post Procedure Diagnosis Same as Pre-procedure Notes x11 sutures removed. Electronic Signature(s) Signed: 06/29/2022 10:59:00 AM By: Kalman Shan DO Signed: 06/29/2022 5:26:50 PM By: [name] RN, BSN Entered By: [name] on 06/29/2022 09:09:26 -------------------------------------------------------------------------------- HPI Details Patient Name: Date of Service: [name] [name] 06/29/2022 9:00 A M Medical Record Number: [id number] Patient Account Number: [id number] Date of Birth/Sex: Treating RN: 12-03-1945 (76 y.o. F)  Primary Care Provider: [name] Other Clinician: Referring Provider: Treating Provider/Extender: Hollie Beach in Treatment: 5 History of Present Illness HPI Description: Admission 05/25/2022 Ms. Silva Aamodt is a 76 year old female with a past medical history of diet-controlled type 2 diabetes, chronic 3 stage kidney disease and venous insufficiency that presents to the clinic for a 1 month history of nonhealing ulcer to the left leg. She states that her dog scratched her causing a wound. She has developed cellulitis in this leg and has been treated with clindamycin by her primary care physician. She reports improvement in her symptoms. She currently denies signs of infection. She has been keeping the area covered. She does not wear compression stockings. She is on 80 mg of Lasix twice daily. She has had reflux studies to the left leg on 07/2021 that notes venous reflux throughout the left common femoral vein and greater saphenous vein. She has not had an ablation. She follows with vein and vascular for her venous insufficiency. 8/14; patient presents for follow-up. She states that the wrap stayed on for 4 days and eventually slid down. She has been wearing her compression  stocking and doing dressing changes with Hydrofera Blue since. She also states she has a hard time putting on her shoes with the 3 layer compression. She denies signs of infection. 8/21; patient presents for follow-up. She again had trouble with the wrap sliding down. We have been using Hydrofera Blue with gentamicin under 2 layer Coflex. She currently denies signs of infection. 8/28; patient presents for follow-up. She reports taking the wrap off yesterday. We have been using Hydrofera Blue with gentamicin under 2 layer Coflex. She states that the wrap does slide down but remains over the wound. She is currently wearing her compression stocking. She would like to do this instead of the compression wrap. 9/5; patient presents for follow-up. She has been using Medihoney to the left lower extremity remedy wound with her compression stockings. She reports no drainage from the site. Unfortunately she fell over the weekend and had to go to the ED due to lacerations she experienced on her left and right lower extremity. On the left leg she had 10 sutures placed on the right leg she had 7. She was given Keflex. Today she reports no signs of infection. She has been using Xeroform to the suture sites. 9/11; patient presents for follow-up. She has been using bacitracin ointment to the suture line. She currently denies signs of infection. Electronic Signature(s) Signed: 06/29/2022 10:59:00 AM By: Kalman Shan DO Entered By: Kalman Shan on 06/29/2022 09:24:15 -------------------------------------------------------------------------------- Physical Exam Details Patient Name: Date of Service: [name], [place] RO [name] 06/29/2022 9:00 A M Medical Record Number: [id number] Patient Account Number: [id number] Date of Birth/Sex: Treating RN: 17-Jan-1946 (76 y.o. F) Primary Care Provider: [name] Other Clinician: Referring Provider: Treating Provider/Extender: Hollie Beach in  Treatment: 5 Constitutional respirations regular, non-labored and within target range for patient.. Cardiovascular 2+ dorsalis pedis/posterior tibialis pulses. Psychiatric pleasant and cooperative. Notes Left lower extremity: T the thigh there is a laceration with sutures in place with dehiscence of the wound. I debrided granulation tissue and nonviable tissue o here. No signs of surrounding infection. Right lower extremity: The lateral aspect there is a laceration with sutures in place with wound dehiscence. Post removal there was granulation tissue and nonviable tissue. Again no surrounding signs of infection. 2+ pitting edema to the knee to both lower extremities. Electronic Signature(s) Signed: 06/29/2022 10:59:00  AM By: Kalman Shan DO Entered By: Kalman Shan on 06/29/2022 09:25:47 -------------------------------------------------------------------------------- Physician Orders Details Patient Name: Date of Service: [name], [name] [name] 06/29/2022 9:00 A M Medical Record Number: [id number] Patient Account Number: [id number] Date of Birth/Sex: Treating RN: [birth year] (76 y.o. [name] Primary Care Provider: [name] Other Clinician: Referring Provider: Treating Provider/Extender: Hollie Beach in Treatment: 5 Verbal / Phone Orders: No Diagnosis Coding ICD-10 Coding Code Description [phone] Chronic venous hypertension (idiopathic) with ulcer of left lower extremity W54.8XXA Other contact with dog, initial encounter [phone] Non-pressure chronic ulcer of other part of left lower leg with fat layer exposed E11.622 Type 2 diabetes mellitus with other skin ulcer T79.8XXA Other early complications of trauma, initial encounter S81.801A Unspecified open wound, right lower leg, initial encounter S81.802A Unspecified open wound, left lower leg, initial encounter Follow-up Appointments ppointment in 1 week. - Dr. Heber [place] and [place], Room 8  Tuesday Return A ppointment in 2 weeks. - Dr. Heber [place] and [place], Room 8 Tuesday Return A Anesthetic (In clinic) Topical Lidocaine 5% applied to wound bed Bathing/ Shower/ Hygiene May shower with protection but do not get wound dressing(s) wet. Edema Control - Lymphedema / SCD / Other Elevate legs to the level of the heart or above for 30 minutes daily and/or when sitting, a frequency of: - 2-3 times a day throughout the day. Avoid standing for long periods of time. Exercise regularly Moisturize legs daily. - lotion both legs every night before bed. Compression stocking or Garment 20-30 mm/Hg pressure to: - apply in the morning and remove at night. Wound Treatment Wound #2 - Upper Leg Wound Laterality: Left, Lateral Cleanser: Soap and Water 1 x Per Day/30 Days Discharge Instructions: May shower and wash wound with dial antibacterial soap and water prior to dressing change. Prim Dressing: MediHoney Gel, tube 1.5 (oz) 1 x Per Day/30 Days ary Discharge Instructions: Apply to wound bed as instructed Secondary Dressing: Zetuvit Plus Silicone Border Dressing 7x7(in/in) 1 x Per Day/30 Days Discharge Instructions: Apply silicone border over primary dressing as directed. Wound #3 - Lower Leg Wound Laterality: Right, Lateral Cleanser: Soap and Water 1 x Per Day/30 Days Discharge Instructions: May shower and wash wound with dial antibacterial soap and water prior to dressing change. Prim Dressing: MediHoney Gel, tube 1.5 (oz) 1 x Per Day/30 Days ary Discharge Instructions: Apply to wound bed as instructed Secondary Dressing: Zetuvit Plus Silicone Border Dressing 7x7(in/in) 1 x Per Day/30 Days Discharge Instructions: Apply silicone border over primary dressing as directed. Electronic Signature(s) Signed: 06/29/2022 10:59:00 AM By: Kalman Shan DO Entered By: Kalman Shan on 06/29/2022 09:26:12 -------------------------------------------------------------------------------- Problem List  Details Patient Name: Date of Service: [name], [place] RO [name] 06/29/2022 9:00 A M Medical Record Number: [id number] Patient Account Number: [id number] Date of Birth/Sex: Treating RN: 1946-08-25 (76 y.o. [name] Primary Care Provider: [name] Other Clinician: Referring Provider: Treating Provider/Extender: Hollie Beach in Treatment: 5 Active Problems ICD-10 Encounter Code Description Active Date MDM Diagnosis I87.312 Chronic venous hypertension (idiopathic) with ulcer of left lower extremity 05/25/2022 No Yes W54.8XXA Other contact with dog, initial encounter 05/25/2022 No Yes L97.822 Non-pressure chronic ulcer of other part of left lower leg with fat layer 05/25/2022 No Yes exposed E11.622 Type 2 diabetes mellitus with other skin ulcer 05/25/2022 No Yes T79.8XXA Other early complications of trauma, initial encounter 06/23/2022 No Yes S81.801A Unspecified open wound, right lower leg, initial encounter 06/23/2022 No Yes S81.802A Unspecified  open wound, left lower leg, initial encounter 06/23/2022 No Yes Inactive Problems Resolved Problems Electronic Signature(s) Signed: 06/29/2022 10:59:00 AM By: Kalman Shan DO Entered By: Kalman Shan on 06/29/2022 09:18:13 -------------------------------------------------------------------------------- Progress Note Details Patient Name: Date of Service: [name] RO [name] 06/29/2022 9:00 A M Medical Record Number: [id number] Patient Account Number: [id number] Date of Birth/Sex: Treating RN: [birth year] (76 y.o. F) Primary Care Provider: [name] Other Clinician: Referring Provider: Treating Provider/Extender: Hollie Beach in Treatment: 5 Subjective Chief Complaint Information obtained from Patient 05/25/2022; left lower extremity wound following a dog scratch 06/23/2022; lacerations to the right and left lower extremity status post fall History of Present Illness (HPI) Admission  05/25/2022 Ms. Angellica Maddison is a 76 year old female with a past medical history of diet-controlled type 2 diabetes, chronic 3 stage kidney disease and venous insufficiency that presents to the clinic for a 1 month history of nonhealing ulcer to the left leg. She states that her dog scratched her causing a wound. She has developed cellulitis in this leg and has been treated with clindamycin by her primary care physician. She reports improvement in her symptoms. She currently denies signs of infection. She has been keeping the area covered. She does not wear compression stockings. She is on 80 mg of Lasix twice daily. She has had reflux studies to the left leg on 07/2021 that notes venous reflux throughout the left common femoral vein and greater saphenous vein. She has not had an ablation. She follows with vein and vascular for her venous insufficiency. 8/14; patient presents for follow-up. She states that the wrap stayed on for 4 days and eventually slid down. She has been wearing her compression stocking and doing dressing changes with Hydrofera Blue since. She also states she has a hard time putting on her shoes with the 3 layer compression. She denies signs of infection. 8/21; patient presents for follow-up. She again had trouble with the wrap sliding down. We have been using Hydrofera Blue with gentamicin under 2 layer Coflex. She currently denies signs of infection. 8/28; patient presents for follow-up. She reports taking the wrap off yesterday. We have been using Hydrofera Blue with gentamicin under 2 layer Coflex. She states that the wrap does slide down but remains over the wound. She is currently wearing her compression stocking. She would like to do this instead of the compression wrap. 9/5; patient presents for follow-up. She has been using Medihoney to the left lower extremity remedy wound with her compression stockings. She reports no drainage from the site. Unfortunately she fell over  the weekend and had to go to the ED due to lacerations she experienced on her left and right lower extremity. On the left leg she had 10 sutures placed on the right leg she had 7. She was given Keflex. Today she reports no signs of infection. She has been using Xeroform to the suture sites. 9/11; patient presents for follow-up. She has been using bacitracin ointment to the suture line. She currently denies signs of infection. Patient History Information obtained from Patient. Family History Cancer - Siblings, Diabetes - Siblings, Heart Disease - Mother, Hypertension - Mother, No family history of Hereditary Spherocytosis, Kidney Disease, Lung Disease, Seizures, Stroke, Thyroid Problems, Tuberculosis. Social History Never smoker, Marital Status - Married, Alcohol Use - Never, Drug Use - No History, Caffeine Use - Moderate. Medical History Eyes Patient has history of Cataracts Denies history of Glaucoma Ear/Nose/Mouth/Throat Denies history of Chronic sinus problems/congestion, Middle ear problems  Hematologic/Lymphatic Patient has history of Anemia Denies history of Hemophilia, Human Immunodeficiency Virus, Lymphedema, Sickle Cell Disease Respiratory Denies history of Aspiration, Asthma, Chronic Obstructive Pulmonary Disease (COPD), Pneumothorax, Sleep Apnea, Tuberculosis Cardiovascular Patient has history of Congestive Heart Failure, Hypertension Denies history of Angina, Arrhythmia, Coronary Artery Disease, Deep Vein Thrombosis, Hypotension, Myocardial Infarction, Peripheral Arterial Disease, Peripheral Venous Disease, Phlebitis, Vasculitis Gastrointestinal Denies history of Cirrhosis , Colitis, Crohnoos, Hepatitis A, Hepatitis B, Hepatitis C Endocrine Denies history of Type I Diabetes, Type II Diabetes Genitourinary Denies history of End Stage Renal Disease Immunological Denies history of Lupus Erythematosus, Raynaudoos, Scleroderma Integumentary (Skin) Denies history of History  of Burn Musculoskeletal Patient has history of Osteoarthritis Denies history of Gout, Rheumatoid Arthritis, Osteomyelitis Neurologic Denies history of Dementia, Neuropathy, Quadriplegia, Paraplegia, Seizure Disorder Oncologic Denies history of Received Chemotherapy Psychiatric Denies history of Anorexia/bulimia, Confinement Anxiety Hospitalization/Surgery History - right total knee replacement. Medical A Surgical History Notes nd Gastrointestinal diverticulosis hiatal hernia hyperlipidemia Musculoskeletal DJD Objective Constitutional respirations regular, non-labored and within target range for patient.. Vitals Time Taken: 8:50 AM, Height: 63 in, Temperature: 98.3 F, Pulse: 76 bpm, Respiratory Rate: 18 breaths/min, Blood Pressure: 136/83 mmHg. Cardiovascular 2+ dorsalis pedis/posterior tibialis pulses. Psychiatric pleasant and cooperative. General Notes: Left lower extremity: T the thigh there is a laceration with sutures in place with dehiscence of the wound. I debrided granulation tissue and o nonviable tissue here. No signs of surrounding infection. Right lower extremity: The lateral aspect there is a laceration with sutures in place with wound dehiscence. Post removal there was granulation tissue and nonviable tissue. Again no surrounding signs of infection. 2+ pitting edema to the knee to both lower extremities. Integumentary (Hair, Skin) Wound #2 status is Open. Original cause of wound was Trauma. The date acquired was: 06/20/2022. The wound is located on the Left,Lateral Upper Leg. The wound measures 0.5cm length x 10cm width x 0.1cm depth; 3.927cm^2 area and 0.393cm^3 volume. There is Fat Layer (Subcutaneous Tissue) exposed. There is no tunneling or undermining noted. There is a medium amount of serosanguineous drainage noted. The wound margin is distinct with the outline attached to the wound base. There is small (1-33%) red, pink granulation within the wound bed. There is  a large (67-100%) amount of necrotic tissue within the wound bed including Eschar and Adherent Slough. General Notes: 10 sutures Wound #3 status is Open. Original cause of wound was Trauma. The date acquired was: 06/20/2022. The wound is located on the Right,Lateral Lower Leg. The wound measures 3.5cm length x 5.7cm width x 0.1cm depth; 15.669cm^2 area and 1.567cm^3 volume. There is Fat Layer (Subcutaneous Tissue) exposed. There is no tunneling or undermining noted. There is a medium amount of serosanguineous drainage noted. The wound margin is distinct with the outline attached to the wound base. There is small (1-33%) red, pink granulation within the wound bed. There is a large (67-100%) amount of necrotic tissue within the wound bed including Eschar and Adherent Slough. General Notes: seven sutures, draining Assessment Active Problems ICD-10 Chronic venous hypertension (idiopathic) with ulcer of left lower extremity Other contact with dog, initial encounter Non-pressure chronic ulcer of other part of left lower leg with fat layer exposed Type 2 diabetes mellitus with other skin ulcer Other early complications of trauma, initial encounter Unspecified open wound, right lower leg, initial encounter Unspecified open wound, left lower leg, initial encounter Sutures were removed today. They were originally placed on 06/20/22 by the ED. 11 from the left and 7 from the right  were removed (origianl amount). The areas were already dehiscing prior to suture removal. I debrided nonviable tissue. No signs of surrounding infection. I recommended Medihoney. I advised to keep mobility to a minimum this week as her wounds could get larger due to the location. She expressed understanding. Follow-up in 1 week. Procedures Wound #2 Pre-procedure diagnosis of Wound #2 is a Trauma, Other located on the Left,Lateral Upper Leg . There was a Excisional Skin/Subcutaneous Tissue Debridement with a total area of 5 sq cm  performed by Kalman Shan, DO. With the following instrument(s): Curette, Forceps, and Scissors to remove Viable and Non-Viable tissue/material. Material removed includes Eschar, Subcutaneous Tissue, Slough, Skin: Dermis, and Skin: Epidermis after achieving pain control using Lidocaine 5% topical ointment. A time out was conducted at 09:00, prior to the start of the procedure. A Minimum amount of bleeding was controlled with Pressure. The procedure was tolerated well with a pain level of 0 throughout and a pain level of 0 following the procedure. Post Debridement Measurements: 0.5cm length x 10cm width x 0.1cm depth; 0.393cm^3 volume. Character of Wound/Ulcer Post Debridement is improved. Post procedure Diagnosis Wound #2: Same as Pre-Procedure General Notes: x11 sutures removed.. Wound #3 Pre-procedure diagnosis of Wound #3 is a Trauma, Other located on the Right,Lateral Lower Leg . There was a Excisional Skin/Subcutaneous Tissue Debridement with a total area of 5.7 sq cm performed by Kalman Shan, DO. With the following instrument(s): Curette, Forceps, and Scissors to remove Viable and Non-Viable tissue/material. Material removed includes Eschar, Subcutaneous Tissue, Slough, Skin: Dermis, and Skin: Epidermis after achieving pain control using Lidocaine 5% topical ointment. A time out was conducted at 09:00, prior to the start of the procedure. A Minimum amount of bleeding was controlled with Pressure. The procedure was tolerated well with a pain level of 0 throughout and a pain level of 0 following the procedure. Post Debridement Measurements: 3.5cm length x 5.7cm width x 0.1cm depth; 1.567cm^3 volume. Character of Wound/Ulcer Post Debridement is improved. Post procedure Diagnosis Wound #3: Same as Pre-Procedure General Notes: x7 sutures removed.. Plan Follow-up Appointments: Return Appointment in 1 week. - Dr. Heber [place] and [name], Room 8 Tuesday Return Appointment in 2 weeks. - Dr.  Heber [place] and [place], Room 8 Tuesday Anesthetic: (In clinic) Topical Lidocaine 5% applied to wound bed Bathing/ Shower/ Hygiene: May shower with protection but do not get wound dressing(s) wet. Edema Control - Lymphedema / SCD / Other: Elevate legs to the level of the heart or above for 30 minutes daily and/or when sitting, a frequency of: - 2-3 times a day throughout the day. Avoid standing for long periods of time. Exercise regularly Moisturize legs daily. - lotion both legs every night before bed. Compression stocking or Garment 20-30 mm/Hg pressure to: - apply in the morning and remove at night. WOUND #2: - Upper Leg Wound Laterality: Left, Lateral Cleanser: Soap and Water 1 x Per Day/30 Days Discharge Instructions: May shower and wash wound with dial antibacterial soap and water prior to dressing change. Prim Dressing: MediHoney Gel, tube 1.5 (oz) 1 x Per Day/30 Days ary Discharge Instructions: Apply to wound bed as instructed Secondary Dressing: Zetuvit Plus Silicone Border Dressing 7x7(in/in) 1 x Per Day/30 Days Discharge Instructions: Apply silicone border over primary dressing as directed. WOUND #3: - Lower Leg Wound Laterality: Right, Lateral Cleanser: Soap and Water 1 x Per Day/30 Days Discharge Instructions: May shower and wash wound with dial antibacterial soap and water prior to dressing change. Prim Dressing: MediHoney Gel, tube  1.5 (oz) 1 x Per Day/30 Days ary Discharge Instructions: Apply to wound bed as instructed Secondary Dressing: Zetuvit Plus Silicone Border Dressing 7x7(in/in) 1 x Per Day/30 Days Discharge Instructions: Apply silicone border over primary dressing as directed. 1. Suture removal 2. In office sharp debridement 3. Medihoney 4. Follow-up in 1 week Electronic Signature(s) Signed: 06/29/2022 10:59:00 AM By: Kalman Shan DO Entered By: Kalman Shan on 06/29/2022  09:30:49 -------------------------------------------------------------------------------- HxROS Details Patient Name: Date of Service: [name], [place] RO [name] 06/29/2022 9:00 A M Medical Record Number: [id number] Patient Account Number: [id number] Date of Birth/Sex: Treating RN: [birth year] (76 y.o. F) Primary Care Provider: [name] Other Clinician: Referring Provider: Treating Provider/Extender: Hollie Beach in Treatment: 5 Information Obtained From Patient Eyes Medical History: Positive for: Cataracts Negative for: Glaucoma Ear/Nose/Mouth/Throat Medical History: Negative for: Chronic sinus problems/congestion; Middle ear problems Hematologic/Lymphatic Medical History: Positive for: Anemia Negative for: Hemophilia; Human Immunodeficiency Virus; Lymphedema; Sickle Cell Disease Respiratory Medical History: Negative for: Aspiration; Asthma; Chronic Obstructive Pulmonary Disease (COPD); Pneumothorax; Sleep Apnea; Tuberculosis Cardiovascular Medical History: Positive for: Congestive Heart Failure; Hypertension Negative for: Angina; Arrhythmia; Coronary Artery Disease; Deep Vein Thrombosis; Hypotension; Myocardial Infarction; Peripheral Arterial Disease; Peripheral Venous Disease; Phlebitis; Vasculitis Gastrointestinal Medical History: Negative for: Cirrhosis ; Colitis; Crohns; Hepatitis A; Hepatitis B; Hepatitis C Past Medical History Notes: diverticulosis hiatal hernia hyperlipidemia Endocrine Medical History: Negative for: Type I Diabetes; Type II Diabetes Genitourinary Medical History: Negative for: End Stage Renal Disease Immunological Medical History: Negative for: Lupus Erythematosus; Raynauds; Scleroderma Integumentary (Skin) Medical History: Negative for: History of Burn Musculoskeletal Medical History: Positive for: Osteoarthritis Negative for: Gout; Rheumatoid Arthritis; Osteomyelitis Past Medical History  Notes: DJD Neurologic Medical History: Negative for: Dementia; Neuropathy; Quadriplegia; Paraplegia; Seizure Disorder Oncologic Medical History: Negative for: Received Chemotherapy Psychiatric Medical History: Negative for: Anorexia/bulimia; Confinement Anxiety HBO Extended History Items Eyes: Cataracts Immunizations Pneumococcal Vaccine: Received Pneumococcal Vaccination: Yes Received Pneumococcal Vaccination On or After 60th Birthday: Yes Implantable Devices None Hospitalization / Surgery History Type of Hospitalization/Surgery right total knee replacement Family and Social History Cancer: Yes - Siblings; Diabetes: Yes - Siblings; Heart Disease: Yes - Mother; Hereditary Spherocytosis: No; Hypertension: Yes - Mother; Kidney Disease: No; Lung Disease: No; Seizures: No; Stroke: No; Thyroid Problems: No; Tuberculosis: No; Never smoker; Marital Status - Married; Alcohol Use: Never; Drug Use: No History; Caffeine Use: Moderate; Financial Concerns: No; Food, Clothing or Shelter Needs: No; Support System Lacking: No; Transportation Concerns: No Electronic Signature(s) Signed: 06/29/2022 10:59:00 AM By: Kalman Shan DO Entered By: Kalman Shan on 06/29/2022 09:24:24 -------------------------------------------------------------------------------- SuperBill Details Patient Name: Date of Service: [name], [place] RO [name] 06/29/2022 Medical Record Number: [id number] Patient Account Number: [id number] Date of Birth/Sex: Treating RN: [birth year] (76 y.o. [name] Primary Care Provider: [name] Other Clinician: Referring Provider: Treating Provider/Extender: Hollie Beach in Treatment: 5 Diagnosis Coding ICD-10 Codes Code Description [phone] Chronic venous hypertension (idiopathic) with ulcer of left lower extremity W54.8XXA Other contact with dog, initial encounter [phone] Non-pressure chronic ulcer of other part of left lower leg with fat layer  exposed E11.622 Type 2 diabetes mellitus with other skin ulcer T79.8XXA Other early complications of trauma, initial encounter S81.801A Unspecified open wound, right lower leg, initial encounter S81.802A Unspecified open wound, left lower leg, initial encounter Facility Procedures CPT4 Code: [id number] Description: 22297 - DEB SUBQ TISSUE 20 SQ CM/< ICD-10 Diagnosis Description S81.802A Unspecified open wound, left lower leg, initial encounter S81.801A Unspecified open wound, right  lower leg, initial encounter Modifier: Quantity: 1 Physician Procedures Electronic Signature(s) Signed: 06/29/2022 10:59:00 AM By: Kalman Shan DO Entered By: Kalman Shan on 06/29/2022 09:30:59

## 2022-06-29 NOTE — Progress Notes (Signed)
CHIZUKO, TRINE ([id number]) Visit Report for 06/29/2022 Arrival Information Details Patient Name: Date of Service: [name], [name] [name] 06/29/2022 9:00 A M Medical Record Number: [id number] Patient Account Number: [id number] Date of Birth/Sex: Treating RN: 20-Jul-1946 (76 y.o. F) Primary Care Norberto Wishon: [name] Other Clinician: Referring Brydan Downard: Treating Fabrizio Filip/Extender: Hollie Beach in Treatment: 5 Visit Information History Since Last Visit Added or deleted any medications: No Patient Arrived: [name] Any new allergies or adverse reactions: No Arrival Time: 08:48 Had a fall or experienced change in No Accompanied By: son activities of daily living that may affect Transfer Assistance: None risk of falls: Patient Identification Verified: Yes Signs or symptoms of abuse/neglect since last visito No Secondary Verification Process Completed: Yes Hospitalized since last visit: No Patient Requires Transmission-Based Precautions: No Implantable device outside of the clinic excluding No Patient Has Alerts: No cellular tissue based products placed in the center since last visit: Has Dressing in Place as Prescribed: Yes Pain Present Now: Yes Electronic Signature(s) Signed: 06/29/2022 5:12:20 PM By: [name] Entered By: [name] on 06/29/2022 08:50:23 -------------------------------------------------------------------------------- Encounter Discharge Information Details Patient Name: Date of Service: [name] [name] 06/29/2022 9:00 A M Medical Record Number: [id number] Patient Account Number: [id number] Date of Birth/Sex: Treating RN: 06/09/1946 (76 y.o. [name], [name] Primary Care Kinze Labo: [name] Other Clinician: Referring Luciana Cammarata: Treating Adreanne Yono/Extender: Hollie Beach in Treatment: 5 Encounter Discharge Information Items Post Procedure Vitals Discharge Condition: Stable Temperature (F):  98.3 Ambulatory Status: Walker Pulse (bpm): 76 Discharge Destination: Home Respiratory Rate (breaths/min): 20 Transportation: Private Auto Blood Pressure (mmHg): 136/83 Accompanied By: son Schedule Follow-up Appointment: Yes Clinical Summary of Care: Electronic Signature(s) Signed: 06/29/2022 5:26:50 PM By: [name] RN, BSN Entered By: [name] on 06/29/2022 09:10:19 -------------------------------------------------------------------------------- Lower Extremity Assessment Details Patient Name: Date of Service: [name] RO [name] 06/29/2022 9:00 A M Medical Record Number: [id number] Patient Account Number: [id number] Date of Birth/Sex: Treating RN: 1946-05-14 (76 y.o. F) Primary Care Shakim Faith: [name] Other Clinician: Referring Senon Nixon: Treating Jannely Henthorn/Extender: Hollie Beach in Treatment: 5 Edema Assessment Assessed: [Left: No] [Right: No] Edema: [Left: Yes] [Right: Yes] Calf Left: Right: Point of Measurement: 41 cm From Medial Instep 40.5 cm 42 cm Ankle Left: Right: Point of Measurement: 8 cm From Medial Instep 26.5 cm 26 cm Vascular Assessment Pulses: Dorsalis Pedis Palpable: [Left:Yes] [Right:Yes] Electronic Signature(s) Signed: 06/29/2022 5:12:20 PM By: [name] Entered By: [name] on 06/29/2022 08:53:34 -------------------------------------------------------------------------------- Multi Wound Chart Details Patient Name: Date of Service: [name], [place] RO [name] 06/29/2022 9:00 A M Medical Record Number: [id number] Patient Account Number: [id number] Date of Birth/Sex: Treating RN: 11-18-1945 (76 y.o. F) Primary Care Isadore Palecek: [name] Other Clinician: Referring Arlenis Blaydes: Treating Ledford Goodson/Extender: Hollie Beach in Treatment: 5 Vital Signs Height(in): 37 Pulse(bpm): 93 Weight(lbs): Blood Pressure(mmHg): 136/83 Body Mass Index(BMI): Temperature(F): 98.3 Respiratory  Rate(breaths/min): 18 Photos: [2:Left, Lateral Upper Leg] [3:Right, Lateral Lower Leg] [N/A:N/A N/A] Wound Location: [2:Trauma] [3:Trauma] [N/A:N/A] Wounding Event: [2:Trauma, Other] [3:Trauma, Other] [N/A:N/A] Primary Etiology: [2:Cataracts, Anemia, Congestive Heart Cataracts, Anemia, Congestive Heart N/A] Comorbid History: [2:Failure, Hypertension, Osteoarthritis Failure, Hypertension, Osteoarthritis 06/20/2022] [3:06/20/2022] [N/A:N/A] Date Acquired: [2:0] [3:0] [N/A:N/A] Weeks of Treatment: [2:Open] [3:Open] [N/A:N/A] Wound Status: [2:No] [3:No] [N/A:N/A] Wound Recurrence: [2:0.5x10x0.1] [3:3.5x5.7x0.1] [N/A:N/A] Measurements L x W x D (cm) [2:3.927] [3:15.669] [N/A:N/A] A (cm) : rea [2:0.393] [3:1.567] [N/A:N/A] Volume (cm) : [2:69.70%] [3:60.10%] [N/A:N/A] % Reduction in A [  2:rea: 84.80%] [3:80.00%] [N/A:N/A] % Reduction in Volume: [2:Full Thickness With Exposed Support Full Thickness With Exposed Support N/A] Classification: [2:Structures Medium] [3:Structures Medium] [N/A:N/A] Exudate A mount: [2:Serosanguineous] [3:Serosanguineous] [N/A:N/A] Exudate Type: [2:red, brown] [3:red, brown] [N/A:N/A] Exudate Color: [2:Distinct, outline attached] [3:Distinct, outline attached] [N/A:N/A] Wound Margin: [2:Small (1-33%)] [3:Small (1-33%)] [N/A:N/A] Granulation A mount: [2:Red, Pink] [3:Red, Pink] [N/A:N/A] Granulation Quality: [2:Large (67-100%)] [3:Large (67-100%)] [N/A:N/A] Necrotic A mount: [2:Eschar, Adherent Slough] [3:Eschar, Adherent Slough] [N/A:N/A] Necrotic Tissue: [2:Fat Layer (Subcutaneous Tissue): Yes Fat Layer (Subcutaneous Tissue): Yes N/A] Exposed Structures: [2:Fascia: No Tendon: No Muscle: No Joint: No Bone: No None] [3:Fascia: No Tendon: No Muscle: No Joint: No Bone: No None] [N/A:N/A] Epithelialization: [2:Debridement - Excisional] [3:Debridement - Excisional] [N/A:N/A] Debridement: Pre-procedure Verification/Time Out 09:00 [3:09:00] [N/A:N/A] Taken: [2:Lidocaine 5%  topical ointment] [3:Lidocaine 5% topical ointment] [N/A:N/A] Pain Control: [2:Necrotic/Eschar, Subcutaneous,] [3:Necrotic/Eschar, Subcutaneous,] [N/A:N/A] Tissue Debrided: [2:Slough Skin/Subcutaneous Tissue] [3:Slough Skin/Subcutaneous Tissue] [N/A:N/A] Level: [2:5] [3:5.7] [N/A:N/A] Debridement A (sq cm): [2:rea Curette, Forceps, Scissors] [3:Curette, Forceps, Scissors] [N/A:N/A] Instrument: [2:Minimum] [3:Minimum] [N/A:N/A] Bleeding: [2:Pressure] [3:Pressure] [N/A:N/A] Hemostasis Achieved: [2:0] [3:0] [N/A:N/A] Procedural Pain: [2:0] [3:0] [N/A:N/A] Post Procedural Pain: Debridement Treatment Response: Procedure was tolerated well [3:Procedure was tolerated well] [N/A:N/A] Post Debridement Measurements L x 0.5x10x0.1 [3:3.5x5.7x0.1] [N/A:N/A] W x D (cm) [2:0.393] [3:1.567] [N/A:N/A] Post Debridement Volume: (cm) [2:10 sutures] [3:seven sutures, draining] [N/A:N/A] Assessment Notes: [2:Debridement] [3:Debridement] [N/A:N/A] Treatment Notes Wound #2 (Upper Leg) Wound Laterality: Left, Lateral Cleanser Soap and Water Discharge Instruction: May shower and wash wound with dial antibacterial soap and water prior to dressing change. Peri-Wound Care Topical Primary Dressing MediHoney Gel, tube 1.5 (oz) Discharge Instruction: Apply to wound bed as instructed Secondary Dressing Zetuvit Plus Silicone Border Dressing 7x7(in/in) Discharge Instruction: Apply silicone border over primary dressing as directed. Secured With Compression Wrap Compression Stockings Add-Ons Wound #3 (Lower Leg) Wound Laterality: Right, Lateral Cleanser Soap and Water Discharge Instruction: May shower and wash wound with dial antibacterial soap and water prior to dressing change. Peri-Wound Care Topical Primary Dressing MediHoney Gel, tube 1.5 (oz) Discharge Instruction: Apply to wound bed as instructed Secondary Dressing Zetuvit Plus Silicone Border Dressing 7x7(in/in) Discharge Instruction: Apply silicone  border over primary dressing as directed. Secured With Compression Wrap Compression Stockings Add-Ons Electronic Signature(s) Signed: 06/29/2022 10:59:00 AM By: Kalman Shan DO Entered By: Kalman Shan on 06/29/2022 09:18:18 -------------------------------------------------------------------------------- Multi-Disciplinary Care Plan Details Patient Name: Date of Service: [name], [place] RO [name] 06/29/2022 9:00 A M Medical Record Number: [id number] Patient Account Number: [id number] Date of Birth/Sex: Treating RN: April 03, 1946 (76 y.o. [name], [name] Primary Care Trella Thurmond: [name] Other Clinician: Referring Winson Eichorn: Treating Dessie Tatem/Extender: Hollie Beach in Treatment: 5 Active Inactive Pain, Acute or Chronic Nursing Diagnoses: Pain, acute or chronic: actual or potential Potential alteration in comfort, pain Goals: Patient will verbalize adequate pain control and receive pain control interventions during procedures as needed Date Initiated: 05/25/2022 Target Resolution Date: 07/17/2022 Goal Status: Active Patient/caregiver will verbalize comfort level met Date Initiated: 05/25/2022 Target Resolution Date: 07/17/2022 Goal Status: Active Interventions: Complete pain assessment as per visit requirements Encourage patient to take pain medications as prescribed Provide education on pain management Treatment Activities: Administer pain control measures as ordered : 05/25/2022 Notes: Venous Leg Ulcer Nursing Diagnoses: Knowledge deficit related to disease process and management Goals: Non-invasive venous studies are completed as ordered Date Initiated: 05/25/2022 Target Resolution Date: 07/17/2022 Goal Status: Active Interventions: Assess peripheral edema status every visit. Provide education on venous insufficiency Treatment Activities: Non-invasive  vascular studies : 05/25/2022 Notes: Wound/Skin Impairment Nursing Diagnoses: Knowledge deficit  related to ulceration/compromised skin integrity Goals: Patient/caregiver will verbalize understanding of skin care regimen Date Initiated: 05/25/2022 Target Resolution Date: 07/17/2022 Goal Status: Active Ulcer/skin breakdown will heal within 14 weeks Date Initiated: 05/25/2022 Target Resolution Date: 08/21/2022 Goal Status: Active Interventions: Assess patient/caregiver ability to obtain necessary supplies Assess patient/caregiver ability to perform ulcer/skin care regimen upon admission and as needed Provide education on ulcer and skin care Treatment Activities: Skin care regimen initiated : 05/25/2022 Topical wound management initiated : 05/25/2022 Notes: Electronic Signature(s) Signed: 06/29/2022 5:26:50 PM By: [name] RN, BSN Entered By: [name] on 06/29/2022 08:59:12 -------------------------------------------------------------------------------- Pain Assessment Details Patient Name: Date of Service: [name], [place] RO [name] 06/29/2022 9:00 A M Medical Record Number: [id number] Patient Account Number: [id number] Date of Birth/Sex: Treating RN: 1946-04-16 (76 y.o. F) Primary Care Caliph Borowiak: [name] Other Clinician: Referring Jerrye Seebeck: Treating Gillian Kluever/Extender: Hollie Beach in Treatment: 5 Active Problems Location of Pain Severity and Description of Pain Patient Has Paino Yes Site Locations Pain Location: Pain Location: Generalized Pain Rate the pain. Current Pain Level: 4 Pain Management and Medication Current Pain Management: Electronic Signature(s) Signed: 06/29/2022 5:12:20 PM By: [name] Entered By: [name] on 06/29/2022 08:51:00 -------------------------------------------------------------------------------- Patient/Caregiver Education Details Patient Name: Date of Service: [name], [place] RO [name] 9/11/2023andnbsp9:00 A M Medical Record Number: [id number] Patient Account Number: [id number] Date of  Birth/Gender: Treating RN: 1946-06-02 (76 y.o. [name] Primary Care Physician: [name] Other Clinician: Referring Physician: Treating Physician/Extender: Hollie Beach in Treatment: 5 Education Assessment Education Provided To: Patient Education Topics Provided Wound/Skin Impairment: Handouts: Skin Care Do's and Dont's Methods: Explain/Verbal Responses: Reinforcements needed Electronic Signature(s) Signed: 06/29/2022 5:26:50 PM By: [name] RN, BSN Entered By: [name] on 06/29/2022 08:59:23 -------------------------------------------------------------------------------- Wound Assessment Details Patient Name: Date of Service: [name], [place] RO [name] 06/29/2022 9:00 A M Medical Record Number: [id number] Patient Account Number: [id number] Date of Birth/Sex: Treating RN: 1946/08/02 (76 y.o. F) Primary Care Michaeljoseph Revolorio: [name] Other Clinician: Referring Detric Scalisi: Treating Leston Schueller/Extender: Hollie Beach in Treatment: 5 Wound Status Wound Number: 2 Primary Trauma, Other Etiology: Wound Location: Left, Lateral Upper Leg Wound Status: Open Wounding Event: Trauma Comorbid Cataracts, Anemia, Congestive Heart Failure, Hypertension, Date Acquired: 06/20/2022 History: Osteoarthritis Weeks Of Treatment: 0 Clustered Wound: No Photos Wound Measurements Length: (cm) 0.5 Width: (cm) 10 Depth: (cm) 0.1 Area: (cm) 3.927 Volume: (cm) 0.393 % Reduction in Area: 69.7% % Reduction in Volume: 84.8% Epithelialization: None Tunneling: No Undermining: No Wound Description Classification: Full Thickness With Exposed Support Structures Wound Margin: Distinct, outline attached Exudate Amount: Medium Exudate Type: Serosanguineous Exudate Color: red, brown Foul Odor After Cleansing: No Slough/Fibrino Yes Wound Bed Granulation Amount: Small (1-33%) Exposed Structure Granulation Quality: Red, Pink Fascia Exposed:  No Necrotic Amount: Large (67-100%) Fat Layer (Subcutaneous Tissue) Exposed: Yes Necrotic Quality: Eschar, Adherent Slough Tendon Exposed: No Muscle Exposed: No Joint Exposed: No Bone Exposed: No Assessment Notes 10 sutures Treatment Notes Wound #2 (Upper Leg) Wound Laterality: Left, Lateral Cleanser Soap and Water Discharge Instruction: May shower and wash wound with dial antibacterial soap and water prior to dressing change. Peri-Wound Care Topical Primary Dressing MediHoney Gel, tube 1.5 (oz) Discharge Instruction: Apply to wound bed as instructed Secondary Dressing Zetuvit Plus Silicone Border Dressing 7x7(in/in) Discharge Instruction: Apply silicone border over primary dressing as directed. Secured With Compression Wrap Compression Stockings Add-Ons Electronic  Signature(s) Signed: 06/29/2022 5:12:20 PM By: [name] Entered By: [name] on 06/29/2022 08:56:29 -------------------------------------------------------------------------------- Wound Assessment Details Patient Name: Date of Service: [name], [place] RO [name] 06/29/2022 9:00 A M Medical Record Number: [id number] Patient Account Number: [id number] Date of Birth/Sex: Treating RN: Mar 01, 1946 (76 y.o. F) Primary Care Jaleia Hanke: [name] Other Clinician: Referring Camrin Gearheart: Treating Lacreasha Hinds/Extender: Hollie Beach in Treatment: 5 Wound Status Wound Number: 3 Primary Trauma, Other Etiology: Wound Location: Right, Lateral Lower Leg Wound Status: Open Wounding Event: Trauma Comorbid Cataracts, Anemia, Congestive Heart Failure, Hypertension, Date Acquired: 06/20/2022 History: Osteoarthritis Weeks Of Treatment: 0 Clustered Wound: No Photos Wound Measurements Length: (cm) 3.5 Width: (cm) 5.7 Depth: (cm) 0.1 Area: (cm) 15.669 Volume: (cm) 1.567 % Reduction in Area: 60.1% % Reduction in Volume: 80% Epithelialization: None Tunneling: No Undermining: No Wound  Description Classification: Full Thickness With Exposed Support Structures Wound Margin: Distinct, outline attached Exudate Amount: Medium Exudate Type: Serosanguineous Exudate Color: red, brown Foul Odor After Cleansing: No Slough/Fibrino Yes Wound Bed Granulation Amount: Small (1-33%) Exposed Structure Granulation Quality: Red, Pink Fascia Exposed: No Necrotic Amount: Large (67-100%) Fat Layer (Subcutaneous Tissue) Exposed: Yes Necrotic Quality: Eschar, Adherent Slough Tendon Exposed: No Muscle Exposed: No Joint Exposed: No Bone Exposed: No Assessment Notes seven sutures, draining Treatment Notes Wound #3 (Lower Leg) Wound Laterality: Right, Lateral Cleanser Soap and Water Discharge Instruction: May shower and wash wound with dial antibacterial soap and water prior to dressing change. Peri-Wound Care Topical Primary Dressing MediHoney Gel, tube 1.5 (oz) Discharge Instruction: Apply to wound bed as instructed Secondary Dressing Zetuvit Plus Silicone Border Dressing 7x7(in/in) Discharge Instruction: Apply silicone border over primary dressing as directed. Secured With Compression Wrap Compression Stockings Environmental education officer) Signed: 06/29/2022 5:12:20 PM By: [name] Entered By: [name] on 06/29/2022 08:56:10 -------------------------------------------------------------------------------- Vitals Details Patient Name: Date of Service: [name], [place] RO [name] 06/29/2022 9:00 A M Medical Record Number: [id number] Patient Account Number: [id number] Date of Birth/Sex: Treating RN: 1945-12-26 (76 y.o. F) Primary Care Shea Kapur: [name] Other Clinician: Referring Shahir Karen: Treating Sharlyn Odonnel/Extender: Hollie Beach in Treatment: 5 Vital Signs Time Taken: 08:50 Temperature (F): 98.3 Height (in): 63 Pulse (bpm): 76 Respiratory Rate (breaths/min): 18 Blood Pressure (mmHg): 136/83 Reference Range: 80 - 120 mg /  dl Electronic Signature(s) Signed: 06/29/2022 5:12:20 PM By: [name] Entered By: [name] on 06/29/2022 08:50:46

## 2022-06-30 DIAGNOSIS — M48062 Spinal stenosis, lumbar region with neurogenic claudication: Secondary | ICD-10-CM | POA: Diagnosis not present

## 2022-07-04 ENCOUNTER — Other Ambulatory Visit: Payer: Self-pay | Admitting: Internal Medicine

## 2022-07-07 ENCOUNTER — Encounter (HOSPITAL_BASED_OUTPATIENT_CLINIC_OR_DEPARTMENT_OTHER): Payer: Medicare HMO | Admitting: Internal Medicine

## 2022-07-07 DIAGNOSIS — E1122 Type 2 diabetes mellitus with diabetic chronic kidney disease: Secondary | ICD-10-CM | POA: Diagnosis not present

## 2022-07-07 DIAGNOSIS — I87312 Chronic venous hypertension (idiopathic) with ulcer of left lower extremity: Secondary | ICD-10-CM | POA: Diagnosis not present

## 2022-07-07 DIAGNOSIS — S81801A Unspecified open wound, right lower leg, initial encounter: Secondary | ICD-10-CM

## 2022-07-07 DIAGNOSIS — I130 Hypertensive heart and chronic kidney disease with heart failure and stage 1 through stage 4 chronic kidney disease, or unspecified chronic kidney disease: Secondary | ICD-10-CM | POA: Diagnosis not present

## 2022-07-07 DIAGNOSIS — I509 Heart failure, unspecified: Secondary | ICD-10-CM | POA: Diagnosis not present

## 2022-07-07 DIAGNOSIS — E11622 Type 2 diabetes mellitus with other skin ulcer: Secondary | ICD-10-CM | POA: Diagnosis not present

## 2022-07-07 DIAGNOSIS — L97822 Non-pressure chronic ulcer of other part of left lower leg with fat layer exposed: Secondary | ICD-10-CM | POA: Diagnosis not present

## 2022-07-07 DIAGNOSIS — N183 Chronic kidney disease, stage 3 unspecified: Secondary | ICD-10-CM | POA: Diagnosis not present

## 2022-07-07 DIAGNOSIS — L97812 Non-pressure chronic ulcer of other part of right lower leg with fat layer exposed: Secondary | ICD-10-CM | POA: Diagnosis not present

## 2022-07-07 DIAGNOSIS — M1990 Unspecified osteoarthritis, unspecified site: Secondary | ICD-10-CM | POA: Diagnosis not present

## 2022-07-07 DIAGNOSIS — I872 Venous insufficiency (chronic) (peripheral): Secondary | ICD-10-CM | POA: Diagnosis not present

## 2022-07-07 NOTE — Progress Notes (Signed)
[name], Brandy ([id number]) Visit Report for 07/07/2022 Chief Complaint Document Details Patient Name: Date of Service: [name] Brandy B. 07/07/2022 8:15 A M Medical Record Number: [id number] Patient Account Number: [id number] Date of Birth/Sex: Treating RN: 03-06-1946 (76 y.o. [name], [name] Primary Care Provider: [name] Other Clinician: Referring Provider: Treating Provider/Extender: Hollie Beach in Treatment: 6 Information Obtained from: Patient Chief Complaint 05/25/2022; left lower extremity wound following a dog scratch 06/23/2022; lacerations to the right and left lower extremity status post fall Electronic Signature(s) Signed: 07/07/2022 9:59:24 AM By: Kalman Shan DO Entered By: Kalman Shan on 07/07/2022 08:56:12 -------------------------------------------------------------------------------- Debridement Details Patient Name: Date of Service: [name] Brandy B. 07/07/2022 8:15 A M Medical Record Number: [id number] Patient Account Number: [id number] Date of Birth/Sex: Treating RN: 1945/11/11 (76 y.o. [name], [name] Primary Care Provider: [name] Other Clinician: Referring Provider: Treating Provider/Extender: Hollie Beach in Treatment: 6 Debridement Performed for Assessment: Wound #2 Left,Lateral Upper Leg Performed By: Physician Kalman Shan, DO Debridement Type: Debridement Level of Consciousness (Pre-procedure): Awake and Alert Pre-procedure Verification/Time Out Yes - 08:40 Taken: Start Time: 08:41 Pain Control: Lidocaine 5% topical ointment T Area Debrided (L x W): otal 0.6 (cm) x 8.8 (cm) = 5.28 (cm) Tissue and other material debrided: Viable, Non-Viable, Eschar, Slough, Subcutaneous, Skin: Dermis , Skin: Epidermis, Slough Level: Skin/Subcutaneous Tissue Debridement Description: Excisional Instrument: Curette Bleeding: Minimum Hemostasis Achieved: Pressure End Time: 08:48 Procedural  Pain: 0 Post Procedural Pain: 0 Response to Treatment: Procedure was tolerated well Level of Consciousness (Post- Awake and Alert procedure): Post Debridement Measurements of Total Wound Length: (cm) 0.6 Width: (cm) 8.8 Depth: (cm) 0.5 Volume: (cm) 2.073 Character of Wound/Ulcer Post Debridement: Requires Further Debridement Post Procedure Diagnosis Same as Pre-procedure Electronic Signature(s) Signed: 07/07/2022 9:59:24 AM By: Kalman Shan DO Signed: 07/07/2022 5:29:48 PM By: [name] RN, BSN Entered By: [name] on 07/07/2022 08:48:30 -------------------------------------------------------------------------------- Debridement Details Patient Name: Date of Service: [name] Brandy B. 07/07/2022 8:15 A M Medical Record Number: [id number] Patient Account Number: [id number] Date of Birth/Sex: Treating RN: 01-Nov-1945 (76 y.o. [name], [name] Primary Care Provider: [name] Other Clinician: Referring Provider: Treating Provider/Extender: Hollie Beach in Treatment: 6 Debridement Performed for Assessment: Wound #3 Right,Lateral Lower Leg Performed By: Physician Kalman Shan, DO Debridement Type: Debridement Level of Consciousness (Pre-procedure): Awake and Alert Pre-procedure Verification/Time Out Yes - 08:40 Taken: Start Time: 08:41 Pain Control: Lidocaine 5% topical ointment T Area Debrided (L x W): otal 3.6 (cm) x 4.5 (cm) = 16.2 (cm) Tissue and other material debrided: Viable, Non-Viable, Slough, Subcutaneous, Skin: Dermis , Skin: Epidermis, Slough Level: Skin/Subcutaneous Tissue Debridement Description: Excisional Instrument: Curette Bleeding: Minimum Hemostasis Achieved: Pressure End Time: 08:48 Procedural Pain: 0 Post Procedural Pain: 0 Response to Treatment: Procedure was tolerated well Level of Consciousness (Post- Awake and Alert procedure): Post Debridement Measurements of Total Wound Length: (cm) 3.6 Width:  (cm) 4.5 Depth: (cm) 0.5 Volume: (cm) 6.362 Character of Wound/Ulcer Post Debridement: Requires Further Debridement Post Procedure Diagnosis Same as Pre-procedure Electronic Signature(s) Signed: 07/07/2022 9:59:24 AM By: Kalman Shan DO Signed: 07/07/2022 5:29:48 PM By: [name] RN, BSN Entered By: [name] on 07/07/2022 08:48:53 -------------------------------------------------------------------------------- HPI Details Patient Name: Date of Service: [name] Brandy B. 07/07/2022 8:15 A M Medical Record Number: [id number] Patient Account Number: [id number] Date of Birth/Sex: Treating RN: 1945-12-16 (76 y.o. [name] Primary Care Provider: Other Clinician:  [name] Referring Provider: Treating Provider/Extender: Hollie Beach in Treatment: 6 History of Present Illness HPI Description: Admission 05/25/2022 [name] is a 76 year old female with a past medical history of diet-controlled type 2 diabetes, chronic 3 stage kidney disease and venous insufficiency that presents to the clinic for a 1 month history of nonhealing ulcer to the left leg. She states that her dog scratched her causing a wound. She has developed cellulitis in this leg and has been treated with clindamycin by her primary care physician. She reports improvement in her symptoms. She currently denies signs of infection. She has been keeping the area covered. She does not wear compression stockings. She is on 80 mg of Lasix twice daily. She has had reflux studies to the left leg on 07/2021 that notes venous reflux throughout the left common femoral vein and greater saphenous vein. She has not had an ablation. She follows with vein and vascular for her venous insufficiency. 8/14; patient presents for follow-up. She states that the wrap stayed on for 4 days and eventually slid down. She has been wearing her compression stocking and doing dressing changes with Hydrofera  Blue since. She also states she has a hard time putting on her shoes with the 3 layer compression. She denies signs of infection. 8/21; patient presents for follow-up. She again had trouble with the wrap sliding down. We have been using Hydrofera Blue with gentamicin under 2 layer Coflex. She currently denies signs of infection. 8/28; patient presents for follow-up. She reports taking the wrap off yesterday. We have been using Hydrofera Blue with gentamicin under 2 layer Coflex. She states that the wrap does slide down but remains over the wound. She is currently wearing her compression stocking. She would like to do this instead of the compression wrap. 9/5; patient presents for follow-up. She has been using Medihoney to the left lower extremity remedy wound with her compression stockings. She reports no drainage from the site. Unfortunately she fell over the weekend and had to go to the ED due to lacerations she experienced on her left and right lower extremity. On the left leg she had 10 sutures placed on the right leg she had 7. She was given Keflex. Today she reports no signs of infection. She has been using Xeroform to the suture sites. 9/11; patient presents for follow-up. She has been using bacitracin ointment to the suture line. She currently denies signs of infection. 9/19; patient presents for follow-up. She has been using Medihoney to the wound beds. She denies signs of infection. Electronic Signature(s) Signed: 07/07/2022 9:59:24 AM By: Kalman Shan DO Entered By: Kalman Shan on 07/07/2022 08:56:33 -------------------------------------------------------------------------------- Physical Exam Details Patient Name: Date of Service: [name] [name] Brandy B. 07/07/2022 8:15 A M Medical Record Number: [id number] Patient Account Number: [id number] Date of Birth/Sex: Treating RN: 25-Sep-1946 (76 y.o. [name] Primary Care Provider: [name] Other Clinician: Referring  Provider: Treating Provider/Extender: Hollie Beach in Treatment: 6 Constitutional respirations regular, non-labored and within target range for patient.. Cardiovascular 2+ dorsalis pedis/posterior tibialis pulses. Psychiatric pleasant and cooperative. Notes Dehisced wounds to both lower extremities with granulation tissue, nonviable tissue and subcutaneous fat. 2+ pitting edema to the knees. No signs of surrounding soft tissue infection. Electronic Signature(s) Signed: 07/07/2022 9:59:24 AM By: Kalman Shan DO Entered By: Kalman Shan on 07/07/2022 08:57:57 -------------------------------------------------------------------------------- Physician Orders Details Patient Name: Date of Service: [name] Brandy B. 07/07/2022 8:15 A M Medical  Record Number: [id number] Patient Account Number: [id number] Date of Birth/Sex: Treating RN: 1946/05/22 (76 y.o. [name] Primary Care Provider: [name] Other Clinician: Referring Provider: Treating Provider/Extender: Hollie Beach in Treatment: 6 Verbal / Phone Orders: No Diagnosis Coding ICD-10 Coding Code Description [phone] Chronic venous hypertension (idiopathic) with ulcer of left lower extremity W54.8XXA Other contact with dog, initial encounter [phone] Non-pressure chronic ulcer of other part of left lower leg with fat layer exposed E11.622 Type 2 diabetes mellitus with other skin ulcer T79.8XXA Other early complications of trauma, initial encounter S81.801A Unspecified open wound, right lower leg, initial encounter S81.802A Unspecified open wound, left lower leg, initial encounter Follow-up Appointments ppointment in 1 week. - Dr. Heber [place] and [place], Room 8 Tuesday Return A ppointment in 2 weeks. - Dr. Heber [place] and [place], Room 8 Tuesday Return A Other: - pick up dakin's solution at Your pharmacy. If your pharmacy does not have go to [facility]. Anesthetic (In clinic) Topical Lidocaine 5% applied to wound bed Bathing/ Shower/ Hygiene May shower with protection but do not get wound dressing(s) wet. Edema Control - Lymphedema / SCD / Other Elevate legs to the level of the heart or above for 30 minutes daily and/or when sitting, a frequency of: - 2-3 times a day throughout the day. Avoid standing for long periods of time. Exercise regularly Moisturize legs daily. - lotion both legs every night before bed. Compression stocking or Garment 20-30 mm/Hg pressure to: - apply in the morning and remove at night. Wound Treatment Wound #2 - Upper Leg Wound Laterality: Left, Lateral Cleanser: Soap and Water 1 x Per Day/30 Days Discharge Instructions: May shower and wash wound with dial antibacterial soap and water prior to dressing change. Prim Dressing: Dakin's Solution 0.25%, 16 (oz) 1 x Per Day/30 Days ary Discharge Instructions: Moisten gauze with Dakin's solution Secondary Dressing: Woven Gauze Sponges 2x2 in 1 x Per Day/30 Days Discharge Instructions: Apply over primary dressing as directed. Secondary Dressing: Zetuvit Plus Silicone Border Dressing 7x7(in/in) 1 x Per Day/30 Days Discharge Instructions: Apply silicone border over primary dressing as directed. Wound #3 - Lower Leg Wound Laterality: Right, Lateral Cleanser: Soap and Water 1 x Per Day/30 Days Discharge Instructions: May shower and wash wound with dial antibacterial soap and water prior to dressing change. Prim Dressing: Dakin's Solution 0.25%, 16 (oz) 1 x Per Day/30 Days ary Discharge Instructions: Moisten gauze with Dakin's solution Secondary Dressing: Woven Gauze Sponges 2x2 in 1 x Per Day/30 Days Discharge Instructions: Apply over primary dressing as directed. Secondary Dressing: Zetuvit Plus Silicone Border Dressing 7x7(in/in) 1 x Per Day/30 Days Discharge Instructions: Apply silicone border over primary dressing as directed. Patient Medications llergies:  doxycycline, oxycodone, soybean, Sulfa (Sulfonamide Antibiotics) A Notifications Medication Indication Start End 07/07/2022 Dakin's Solution DOSE 1 - miscellaneous 0.125 % solution - moisten gauze for wet to dry dressings Electronic Signature(s) Signed: 07/07/2022 9:59:24 AM By: Kalman Shan DO Previous Signature: 07/07/2022 9:07:16 AM Version By: Kalman Shan DO Entered By: Kalman Shan on 07/07/2022 09:08:09 -------------------------------------------------------------------------------- Problem List Details Patient Name: Date of Service: [name] Brandy B. 07/07/2022 8:15 A M Medical Record Number: [id number] Patient Account Number: [id number] Date of Birth/Sex: Treating RN: [birth year] (76 y.o. [name] Primary Care Provider: [name] Other Clinician: Referring Provider: Treating Provider/Extender: Hollie Beach in Treatment: 6 Active Problems ICD-10 Encounter Code Description Active Date MDM Diagnosis I87.312 Chronic venous hypertension (idiopathic) with ulcer of left lower  extremity 05/25/2022 No Yes W54.8XXA Other contact with dog, initial encounter 05/25/2022 No Yes L97.822 Non-pressure chronic ulcer of other part of left lower leg with fat layer 05/25/2022 No Yes exposed E11.622 Type 2 diabetes mellitus with other skin ulcer 05/25/2022 No Yes T79.8XXA Other early complications of trauma, initial encounter 06/23/2022 No Yes S81.801A Unspecified open wound, right lower leg, initial encounter 06/23/2022 No Yes S81.802A Unspecified open wound, left lower leg, initial encounter 06/23/2022 No Yes Inactive Problems Resolved Problems Electronic Signature(s) Signed: 07/07/2022 9:59:24 AM By: Kalman Shan DO Entered By: Kalman Shan on 07/07/2022 08:55:49 -------------------------------------------------------------------------------- Progress Note Details Patient Name: Date of Service: [name] Brandy B. 07/07/2022 8:15 A M Medical  Record Number: [id number] Patient Account Number: [id number] Date of Birth/Sex: Treating RN: 11-Mar-1946 (76 y.o. [name] Primary Care Provider: [name] Other Clinician: Referring Provider: Treating Provider/Extender: Hollie Beach in Treatment: 6 Subjective Chief Complaint Information obtained from Patient 05/25/2022; left lower extremity wound following a dog scratch 06/23/2022; lacerations to the right and left lower extremity status post fall History of Present Illness (HPI) Admission 05/25/2022 Ms. Jaina Morin is a 76 year old female with a past medical history of diet-controlled type 2 diabetes, chronic 3 stage kidney disease and venous insufficiency that presents to the clinic for a 1 month history of nonhealing ulcer to the left leg. She states that her dog scratched her causing a wound. She has developed cellulitis in this leg and has been treated with clindamycin by her primary care physician. She reports improvement in her symptoms. She currently denies signs of infection. She has been keeping the area covered. She does not wear compression stockings. She is on 80 mg of Lasix twice daily. She has had reflux studies to the left leg on 07/2021 that notes venous reflux throughout the left common femoral vein and greater saphenous vein. She has not had an ablation. She follows with vein and vascular for her venous insufficiency. 8/14; patient presents for follow-up. She states that the wrap stayed on for 4 days and eventually slid down. She has been wearing her compression stocking and doing dressing changes with Hydrofera Blue since. She also states she has a hard time putting on her shoes with the 3 layer compression. She denies signs of infection. 8/21; patient presents for follow-up. She again had trouble with the wrap sliding down. We have been using Hydrofera Blue with gentamicin under 2 layer Coflex. She currently denies signs of infection. 8/28;  patient presents for follow-up. She reports taking the wrap off yesterday. We have been using Hydrofera Blue with gentamicin under 2 layer Coflex. She states that the wrap does slide down but remains over the wound. She is currently wearing her compression stocking. She would like to do this instead of the compression wrap. 9/5; patient presents for follow-up. She has been using Medihoney to the left lower extremity remedy wound with her compression stockings. She reports no drainage from the site. Unfortunately she fell over the weekend and had to go to the ED due to lacerations she experienced on her left and right lower extremity. On the left leg she had 10 sutures placed on the right leg she had 7. She was given Keflex. Today she reports no signs of infection. She has been using Xeroform to the suture sites. 9/11; patient presents for follow-up. She has been using bacitracin ointment to the suture line. She currently denies signs of infection. 9/19; patient presents for follow-up. She has been  using Medihoney to the wound beds. She denies signs of infection. Patient History Information obtained from Patient. Family History Cancer - Siblings, Diabetes - Siblings, Heart Disease - Mother, Hypertension - Mother, No family history of Hereditary Spherocytosis, Kidney Disease, Lung Disease, Seizures, Stroke, Thyroid Problems, Tuberculosis. Social History Never smoker, Marital Status - Married, Alcohol Use - Never, Drug Use - No History, Caffeine Use - Moderate. Medical History Eyes Patient has history of Cataracts Denies history of Glaucoma Ear/Nose/Mouth/Throat Denies history of Chronic sinus problems/congestion, Middle ear problems Hematologic/Lymphatic Patient has history of Anemia Denies history of Hemophilia, Human Immunodeficiency Virus, Lymphedema, Sickle Cell Disease Respiratory Denies history of Aspiration, Asthma, Chronic Obstructive Pulmonary Disease (COPD), Pneumothorax, Sleep  Apnea, Tuberculosis Cardiovascular Patient has history of Congestive Heart Failure, Hypertension Denies history of Angina, Arrhythmia, Coronary Artery Disease, Deep Vein Thrombosis, Hypotension, Myocardial Infarction, Peripheral Arterial Disease, Peripheral Venous Disease, Phlebitis, Vasculitis Gastrointestinal Denies history of Cirrhosis , Colitis, Crohnoos, Hepatitis A, Hepatitis B, Hepatitis C Endocrine Denies history of Type I Diabetes, Type II Diabetes Genitourinary Denies history of End Stage Renal Disease Immunological Denies history of Lupus Erythematosus, Raynaudoos, Scleroderma Integumentary (Skin) Denies history of History of Burn Musculoskeletal Patient has history of Osteoarthritis Denies history of Gout, Rheumatoid Arthritis, Osteomyelitis Neurologic Denies history of Dementia, Neuropathy, Quadriplegia, Paraplegia, Seizure Disorder Oncologic Denies history of Received Chemotherapy Psychiatric Denies history of Anorexia/bulimia, Confinement Anxiety Hospitalization/Surgery History - right total knee replacement. Medical A Surgical History Notes nd Gastrointestinal diverticulosis hiatal hernia hyperlipidemia Musculoskeletal DJD Objective Constitutional respirations regular, non-labored and within target range for patient.. Vitals Time Taken: 8:11 AM, Height: 63 in, Temperature: 98.7 F, Pulse: 81 bpm, Respiratory Rate: 20 breaths/min, Blood Pressure: 160/103 mmHg. Cardiovascular 2+ dorsalis pedis/posterior tibialis pulses. Psychiatric pleasant and cooperative. General Notes: Dehisced wounds to both lower extremities with granulation tissue, nonviable tissue and subcutaneous fat. 2+ pitting edema to the knees. No signs of surrounding soft tissue infection. Integumentary (Hair, Skin) Wound #2 status is Open. Original cause of wound was Trauma. The date acquired was: 06/20/2022. The wound has been in treatment 2 weeks. The wound is located on the Left,Lateral Upper  Leg. The wound measures 0.6cm length x 8.8cm width x 0.5cm depth; 4.147cm^2 area and 2.073cm^3 volume. There is Fat Layer (Subcutaneous Tissue) exposed. There is no tunneling or undermining noted. There is a medium amount of serosanguineous drainage noted. The wound margin is distinct with the outline attached to the wound base. There is small (1-33%) red, pink granulation within the wound bed. There is a large (67-100%) amount of necrotic tissue within the wound bed including Eschar and Adherent Slough. Wound #3 status is Open. Original cause of wound was Trauma. The date acquired was: 06/20/2022. The wound has been in treatment 2 weeks. The wound is located on the Right,Lateral Lower Leg. The wound measures 3.6cm length x 4.5cm width x 0.5cm depth; 12.723cm^2 area and 6.362cm^3 volume. There is Fat Layer (Subcutaneous Tissue) exposed. There is no undermining noted, however, there is tunneling at 11:00 with a maximum distance of 1.4cm. There is a medium amount of serosanguineous drainage noted. The wound margin is distinct with the outline attached to the wound base. There is small (1-33%) red, pink granulation within the wound bed. There is a large (67-100%) amount of necrotic tissue within the wound bed including Adherent Slough. Assessment Active Problems ICD-10 Chronic venous hypertension (idiopathic) with ulcer of left lower extremity Other contact with dog, initial encounter Non-pressure chronic ulcer of other part of left lower leg  with fat layer exposed Type 2 diabetes mellitus with other skin ulcer Other early complications of trauma, initial encounter Unspecified open wound, right lower leg, initial encounter Unspecified open wound, left lower leg, initial encounter Patient's wounds are stable. No signs of infection on exam. There is more nonviable tissue today. I debrided this and recommended switching the dressing to Dakin's wet-to-dry. Next week she would likely benefit from a  compression wrap to the right lower extremity. She can continue her daily compression stockings. Procedures Wound #2 Pre-procedure diagnosis of Wound #2 is a Trauma, Other located on the Left,Lateral Upper Leg . There was a Excisional Skin/Subcutaneous Tissue Debridement with a total area of 5.28 sq cm performed by Kalman Shan, DO. With the following instrument(s): Curette to remove Viable and Non-Viable tissue/material. Material removed includes Eschar, Subcutaneous Tissue, Slough, Skin: Dermis, and Skin: Epidermis after achieving pain control using Lidocaine 5% topical ointment. A time out was conducted at 08:40, prior to the start of the procedure. A Minimum amount of bleeding was controlled with Pressure. The procedure was tolerated well with a pain level of 0 throughout and a pain level of 0 following the procedure. Post Debridement Measurements: 0.6cm length x 8.8cm width x 0.5cm depth; 2.073cm^3 volume. Character of Wound/Ulcer Post Debridement requires further debridement. Post procedure Diagnosis Wound #2: Same as Pre-Procedure Wound #3 Pre-procedure diagnosis of Wound #3 is a Trauma, Other located on the Right,Lateral Lower Leg . There was a Excisional Skin/Subcutaneous Tissue Debridement with a total area of 16.2 sq cm performed by Kalman Shan, DO. With the following instrument(s): Curette to remove Viable and Non-Viable tissue/material. Material removed includes Subcutaneous Tissue, Slough, Skin: Dermis, and Skin: Epidermis after achieving pain control using Lidocaine 5% topical ointment. A time out was conducted at 08:40, prior to the start of the procedure. A Minimum amount of bleeding was controlled with Pressure. The procedure was tolerated well with a pain level of 0 throughout and a pain level of 0 following the procedure. Post Debridement Measurements: 3.6cm length x 4.5cm width x 0.5cm depth; 6.362cm^3 volume. Character of Wound/Ulcer Post Debridement requires further  debridement. Post procedure Diagnosis Wound #3: Same as Pre-Procedure Plan Follow-up Appointments: Return Appointment in 1 week. - Dr. Heber [place] and [name], Room 8 Tuesday Return Appointment in 2 weeks. - Dr. Heber [place] and [place], Room 8 Tuesday Other: - pick up dakin's solution at Your pharmacy. If your pharmacy does not have go to [facility] Anesthetic: (In clinic) Topical Lidocaine 5% applied to wound bed Bathing/ Shower/ Hygiene: May shower with protection but do not get wound dressing(s) wet. Edema Control - Lymphedema / SCD / Other: Elevate legs to the level of the heart or above for 30 minutes daily and/or when sitting, a frequency of: - 2-3 times a day throughout the day. Avoid standing for long periods of time. Exercise regularly Moisturize legs daily. - lotion both legs every night before bed. Compression stocking or Garment 20-30 mm/Hg pressure to: - apply in the morning and remove at night. The following medication(s) was prescribed: Dakin's Solution miscellaneous 0.125 % solution 1 moisten gauze for wet to dry dressings starting 07/07/2022 WOUND #2: - Upper Leg Wound Laterality: Left, Lateral Cleanser: Soap and Water 1 x Per Day/30 Days Discharge Instructions: May shower and wash wound with dial antibacterial soap and water prior to dressing change. Prim Dressing: Dakin's Solution 0.25%, 16 (oz) 1 x Per Day/30 Days ary Discharge Instructions: Moisten gauze with Dakin's solution Secondary Dressing: Woven Gauze Sponges 2x2  in 1 x Per Day/30 Days Discharge Instructions: Apply over primary dressing as directed. Secondary Dressing: Zetuvit Plus Silicone Border Dressing 7x7(in/in) 1 x Per Day/30 Days Discharge Instructions: Apply silicone border over primary dressing as directed. WOUND #3: - Lower Leg Wound Laterality: Right, Lateral Cleanser: Soap and Water 1 x Per Day/30 Days Discharge Instructions: May shower and wash wound with dial antibacterial soap and water prior to  dressing change. Prim Dressing: Dakin's Solution 0.25%, 16 (oz) 1 x Per Day/30 Days ary Discharge Instructions: Moisten gauze with Dakin's solution Secondary Dressing: Woven Gauze Sponges 2x2 in 1 x Per Day/30 Days Discharge Instructions: Apply over primary dressing as directed. Secondary Dressing: Zetuvit Plus Silicone Border Dressing 7x7(in/in) 1 x Per Day/30 Days Discharge Instructions: Apply silicone border over primary dressing as directed. 1. In office sharp debridement 2. Dakin's wet-to-dry dressings 3. Continue daily compression stockings 4. Follow-up in 1 week Electronic Signature(s) Signed: 07/07/2022 9:59:24 AM By: Kalman Shan DO Entered By: Kalman Shan on 07/07/2022 09:08:17 -------------------------------------------------------------------------------- HxROS Details Patient Name: Date of Service: [name] Brandy B. 07/07/2022 8:15 A M Medical Record Number: [id number] Patient Account Number: [id number] Date of Birth/Sex: Treating RN: [birth year] (76 y.o. [name] Primary Care Provider: Other Clinician: [name] Referring Provider: Treating Provider/Extender: Hollie Beach in Treatment: 6 Information Obtained From Patient Eyes Medical History: Positive for: Cataracts Negative for: Glaucoma Ear/Nose/Mouth/Throat Medical History: Negative for: Chronic sinus problems/congestion; Middle ear problems Hematologic/Lymphatic Medical History: Positive for: Anemia Negative for: Hemophilia; Human Immunodeficiency Virus; Lymphedema; Sickle Cell Disease Respiratory Medical History: Negative for: Aspiration; Asthma; Chronic Obstructive Pulmonary Disease (COPD); Pneumothorax; Sleep Apnea; Tuberculosis Cardiovascular Medical History: Positive for: Congestive Heart Failure; Hypertension Negative for: Angina; Arrhythmia; Coronary Artery Disease; Deep Vein Thrombosis; Hypotension; Myocardial Infarction; Peripheral Arterial  Disease; Peripheral Venous Disease; Phlebitis; Vasculitis Gastrointestinal Medical History: Negative for: Cirrhosis ; Colitis; Crohns; Hepatitis A; Hepatitis B; Hepatitis C Past Medical History Notes: diverticulosis hiatal hernia hyperlipidemia Endocrine Medical History: Negative for: Type I Diabetes; Type II Diabetes Genitourinary Medical History: Negative for: End Stage Renal Disease Immunological Medical History: Negative for: Lupus Erythematosus; Raynauds; Scleroderma Integumentary (Skin) Medical History: Negative for: History of Burn Musculoskeletal Medical History: Positive for: Osteoarthritis Negative for: Gout; Rheumatoid Arthritis; Osteomyelitis Past Medical History Notes: DJD Neurologic Medical History: Negative for: Dementia; Neuropathy; Quadriplegia; Paraplegia; Seizure Disorder Oncologic Medical History: Negative for: Received Chemotherapy Psychiatric Medical History: Negative for: Anorexia/bulimia; Confinement Anxiety HBO Extended History Items Eyes: Cataracts Immunizations Pneumococcal Vaccine: Received Pneumococcal Vaccination: Yes Received Pneumococcal Vaccination On or After 60th Birthday: Yes Implantable Devices None Hospitalization / Surgery History Type of Hospitalization/Surgery right total knee replacement Family and Social History Cancer: Yes - Siblings; Diabetes: Yes - Siblings; Heart Disease: Yes - Mother; Hereditary Spherocytosis: No; Hypertension: Yes - Mother; Kidney Disease: No; Lung Disease: No; Seizures: No; Stroke: No; Thyroid Problems: No; Tuberculosis: No; Never smoker; Marital Status - Married; Alcohol Use: Never; Drug Use: No History; Caffeine Use: Moderate; Financial Concerns: No; Food, Clothing or Shelter Needs: No; Support System Lacking: No; Transportation Concerns: No Electronic Signature(s) Signed: 07/07/2022 9:59:24 AM By: Kalman Shan DO Signed: 07/07/2022 5:29:48 PM By: [name] RN, BSN Entered By: Kalman Shan on 07/07/2022 08:56:40 -------------------------------------------------------------------------------- SuperBill Details Patient Name: Date of Service: [name] Brandy B. 07/07/2022 Medical Record Number: [id number] Patient Account Number: [id number] Date of Birth/Sex: Treating RN: 1946-08-30 (75 y.o. [name], [name] Primary Care Provider: [name] Other Clinician: Referring Provider: Treating Provider/Extender: Kalman Shan  Rogene [place] in Treatment: 6 Diagnosis Coding ICD-10 Codes Code Description [phone] Chronic venous hypertension (idiopathic) with ulcer of left lower extremity W54.8XXA Other contact with dog, initial encounter [phone] Non-pressure chronic ulcer of other part of left lower leg with fat layer exposed E11.622 Type 2 diabetes mellitus with other skin ulcer T79.8XXA Other early complications of trauma, initial encounter S81.801A Unspecified open wound, right lower leg, initial encounter S81.802A Unspecified open wound, left lower leg, initial encounter Facility Procedures CPT4 Code: [id number] IC Description: 11042 - DEB SUBQ TISSUE 20 SQ CM/< ICD-10 Diagnosis Description L97.822 Non-pressure chronic ulcer of other part of left lower leg with fat layer expo 045 - DEB SUBQ TISS EA ADDL 20CM D-10 Diagnosis Description S81.801A Unspecified open  wound, right lower leg, initial encounter Modifier: sed 1 Quantity: 1 Physician Procedures : CPT4 Code Description Modifier [id number] 11042 - WC PHYS SUBQ TISS 20 SQ CM ICD-10 Diagnosis Description L97.822 Non-pressure chronic ulcer of other part of left lower leg with fat layer exposed Quantity: 1 : [id number] 11045 - WC PHYS SUBQ TISS EA ADDL 20 CM ICD-10 Diagnosis Description S81.801A Unspecified open wound, right lower leg, initial encounter Quantity: 1 Electronic Signature(s) Signed: 07/07/2022 9:59:24 AM By: Kalman Shan DO Entered By: Kalman Shan on 07/07/2022 59:16:38

## 2022-07-07 NOTE — Progress Notes (Signed)
YARETHZI, BRANAN ([id number]) Visit Report for 07/07/2022 Arrival Information Details Patient Name: Date of Service: Brandy Corolla Brandy B. 07/07/2022 8:15 A M Medical Record Number: [id number] Patient Account Number: [id number] Date of Birth/Sex: Treating RN: 25-Oct-1945 (76 y.o. [name], [name] Primary Care Provider: [name] Other Clinician: Referring Provider: Treating Provider/Extender: Hollie Beach in Treatment: 6 Visit Information History Since Last Visit Added or deleted any medications: No Patient Arrived: [name] Any new allergies or adverse reactions: No Arrival Time: 08:11 Had a fall or experienced change in No Accompanied By: son activities of daily living that may affect Transfer Assistance: None risk of falls: Patient Identification Verified: Yes Signs or symptoms of abuse/neglect since last visito No Secondary Verification Process Completed: Yes Hospitalized since last visit: No Patient Requires Transmission-Based Precautions: No Implantable device outside of the clinic excluding No Patient Has Alerts: No cellular tissue based products placed in the center since last visit: Has Dressing in Place as Prescribed: Yes Pain Present Now: No Electronic Signature(s) Signed: 07/07/2022 5:29:48 PM By: [name] RN, BSN Entered By: [name] on 07/07/2022 08:11:32 -------------------------------------------------------------------------------- Encounter Discharge Information Details Patient Name: Date of Service: [name], Brandy RO Brandy B. 07/07/2022 8:15 A M Medical Record Number: [id number] Patient Account Number: [id number] Date of Birth/Sex: Treating RN: 09-16-1946 (76 y.o. [name], [name] Primary Care Provider: [name] Other Clinician: Referring Provider: Treating Provider/Extender: Hollie Beach in Treatment: 6 Encounter Discharge Information Items Post Procedure Vitals Discharge Condition: Stable Temperature  (F): 98.7 Ambulatory Status: Walker Pulse (bpm): 81 Discharge Destination: Home Respiratory Rate (breaths/min): 20 Transportation: Private Auto Blood Pressure (mmHg): 160/103 Accompanied By: son Schedule Follow-up Appointment: Yes Clinical Summary of Care: Electronic Signature(s) Signed: 07/07/2022 5:29:48 PM By: [name] RN, BSN Entered By: [name] on 07/07/2022 08:49:51 -------------------------------------------------------------------------------- Lower Extremity Assessment Details Patient Name: Date of Service: [name], Brandy RO Brandy B. 07/07/2022 8:15 A M Medical Record Number: [id number] Patient Account Number: [id number] Date of Birth/Sex: Treating RN: 1946/09/13 (76 y.o. [name] Primary Care Provider: [name] Other Clinician: Referring Provider: Treating Provider/Extender: Hollie Beach in Treatment: 6 Edema Assessment Assessed: [Left: No] [Right: Yes] Edema: [Left: Yes] [Right: Yes] Calf Left: Right: Point of Measurement: 41 cm From Medial Instep 41 cm Ankle Left: Right: Point of Measurement: 8 cm From Medial Instep 26 cm Vascular Assessment Pulses: Dorsalis Pedis Palpable: [Left:Yes] [Right:Yes] Electronic Signature(s) Signed: 07/07/2022 5:29:48 PM By: [name] RN, BSN Entered By: [name] on 07/07/2022 08:13:52 -------------------------------------------------------------------------------- Multi Wound Chart Details Patient Name: Date of Service: [name], Brandy RO Brandy B. 07/07/2022 8:15 A M Medical Record Number: [id number] Patient Account Number: [id number] Date of Birth/Sex: Treating RN: 1946/08/01 (76 y.o. [name] Primary Care Provider: [name] Other Clinician: Referring Provider: Treating Provider/Extender: Hollie Beach in Treatment: 6 Vital Signs Height(in): 36 Pulse(bpm): 74 Weight(lbs): Blood Pressure(mmHg): 160/103 Body Mass Index(BMI): Temperature(F):  98.7 Respiratory Rate(breaths/min): 20 Photos: [2:Left, Lateral Upper Leg] [3:Right, Lateral Lower Leg] [N/A:N/A N/A] Wound Location: [2:Trauma] [3:Trauma] [N/A:N/A] Wounding Event: [2:Trauma, Other] [3:Trauma, Other] [N/A:N/A] Primary Etiology: [2:Cataracts, Anemia, Congestive Heart] [N/A:N/A] Comorbid History: [2:Failure, Hypertension, Osteoarthritis 06/20/2022] [3:06/20/2022] [N/A:N/A] Date Acquired: [2:2] [3:2] [N/A:N/A] Weeks of Treatment: [2:Open] [3:Open] [N/A:N/A] Wound Status: [2:No] [3:No] [N/A:N/A] Wound Recurrence: [2:0.6x8.8x0.5] [3:3.6x4.5x0.5] [N/A:N/A] Measurements L x W x D (cm) [2:4.147] [3:12.723] [N/A:N/A] A (cm) : rea [2:2.073] [3:6.362] [N/A:N/A] Volume (cm) : [2:68.00%] [3:67.60%] [N/A:N/A] % Reduction in A [2:rea:  20.00%] [3:19.00%] [N/A:N/A] % Reduction in Volume: [3:11] Position 1 (o'clock): [3:1.4] Maximum Distance 1 (cm): [2:No] [3:Yes] [N/A:N/A] Tunneling: [2:Full Thickness With Exposed Support Full Thickness With Exposed Support N/A] Classification: [2:Structures Medium] [3:Structures Medium] [N/A:N/A] Exudate A mount: [2:Serosanguineous] [3:Serosanguineous] [N/A:N/A] Exudate Type: [2:red, brown] [3:red, brown] [N/A:N/A] Exudate Color: [2:Distinct, outline attached] [3:Distinct, outline attached] [N/A:N/A] Wound Margin: [2:Small (1-33%)] [3:Small (1-33%)] [N/A:N/A] Granulation A mount: [2:Red, Pink] [3:Red, Pink] [N/A:N/A] Granulation Quality: [2:Large (67-100%)] [3:Large (67-100%)] [N/A:N/A] Necrotic A mount: [2:Eschar, Adherent Slough] [3:Adherent Slough] [N/A:N/A] Necrotic Tissue: [2:Fat Layer (Subcutaneous Tissue): Yes Fat Layer (Subcutaneous Tissue): Yes N/A] Exposed Structures: [2:Fascia: No Tendon: No Muscle: No Joint: No Bone: No None] [3:Fascia: No Tendon: No Muscle: No Joint: No Bone: No None] [N/A:N/A] Epithelialization: [2:Debridement - Excisional] [3:Debridement - Excisional] [N/A:N/A] Debridement: Pre-procedure Verification/Time Out 08:40  [3:08:40] [N/A:N/A] Taken: [2:Lidocaine 5% topical ointment] [3:Lidocaine 5% topical ointment] [N/A:N/A] Pain Control: [2:Necrotic/Eschar, Subcutaneous,] [3:Subcutaneous, Slough] [N/A:N/A] Tissue Debrided: [2:Slough Skin/Subcutaneous Tissue] [3:Skin/Subcutaneous Tissue] [N/A:N/A] Level: [2:5.28] [3:16.2] [N/A:N/A] Debridement A (sq cm): [2:rea Curette] [3:Curette] [N/A:N/A] Instrument: [2:Minimum] [3:Minimum] [N/A:N/A] Bleeding: [2:Pressure] [3:Pressure] [N/A:N/A] Hemostasis Achieved: [2:0] [3:0] [N/A:N/A] Procedural Pain: [2:0] [3:0] [N/A:N/A] Post Procedural Pain: Debridement Treatment Response: Procedure was tolerated well [3:Procedure was tolerated well] [N/A:N/A] Post Debridement Measurements L x 0.6x8.8x0.5 [3:3.6x4.5x0.5] [N/A:N/A] W x D (cm) [2:2.073] [3:6.362] [N/A:N/A] Post Debridement Volume: (cm) [2:Debridement] [3:Debridement] [N/A:N/A] Treatment Notes Wound #2 (Upper Leg) Wound Laterality: Left, Lateral Cleanser Soap and Water Discharge Instruction: May shower and wash wound with dial antibacterial soap and water prior to dressing change. Peri-Wound Care Topical Primary Dressing Dakin's Solution 0.25%, 16 (oz) Discharge Instruction: Moisten gauze with Dakin's solution Secondary Dressing Zetuvit Plus Silicone Border Dressing 7x7(in/in) Discharge Instruction: Apply silicone border over primary dressing as directed. Secured With Compression Wrap Compression Stockings Add-Ons Wound #3 (Lower Leg) Wound Laterality: Right, Lateral Cleanser Soap and Water Discharge Instruction: May shower and wash wound with dial antibacterial soap and water prior to dressing change. Peri-Wound Care Topical Primary Dressing Dakin's Solution 0.25%, 16 (oz) Discharge Instruction: Moisten gauze with Dakin's solution Secondary Dressing Zetuvit Plus Silicone Border Dressing 7x7(in/in) Discharge Instruction: Apply silicone border over primary dressing as directed. Secured  With Compression Wrap Compression Stockings Add-Ons Electronic Signature(s) Signed: 07/07/2022 9:59:24 AM By: Kalman Shan DO Signed: 07/07/2022 5:29:48 PM By: [name] RN, BSN Entered By: Kalman Shan on 07/07/2022 08:56:05 -------------------------------------------------------------------------------- Multi-Disciplinary Care Plan Details Patient Name: Date of Service: [name], Brandy RO Brandy B. 07/07/2022 8:15 A M Medical Record Number: [id number] Patient Account Number: [id number] Date of Birth/Sex: Treating RN: 03/19/1946 (76 y.o. [name], [name] Primary Care Provider: [name] Other Clinician: Referring Provider: Treating Provider/Extender: Hollie Beach in Treatment: 6 Active Inactive Pain, Acute or Chronic Nursing Diagnoses: Pain, acute or chronic: actual or potential Potential alteration in comfort, pain Goals: Patient will verbalize adequate pain control and receive pain control interventions during procedures as needed Date Initiated: 05/25/2022 Target Resolution Date: 07/17/2022 Goal Status: Active Patient/caregiver will verbalize comfort level met Date Initiated: 05/25/2022 Target Resolution Date: 07/17/2022 Goal Status: Active Interventions: Complete pain assessment as per visit requirements Encourage patient to take pain medications as prescribed Provide education on pain management Treatment Activities: Administer pain control measures as ordered : 05/25/2022 Notes: Venous Leg Ulcer Nursing Diagnoses: Knowledge deficit related to disease process and management Goals: Non-invasive venous studies are completed as ordered Date Initiated: 05/25/2022 Target Resolution Date: 07/17/2022 Goal Status: Active Interventions: Assess peripheral edema status every visit. Provide education on  venous insufficiency Treatment Activities: Non-invasive vascular studies : 05/25/2022 Notes: Wound/Skin Impairment Nursing Diagnoses: Knowledge deficit  related to ulceration/compromised skin integrity Goals: Patient/caregiver will verbalize understanding of skin care regimen Date Initiated: 05/25/2022 Target Resolution Date: 07/17/2022 Goal Status: Active Ulcer/skin breakdown will heal within 14 weeks Date Initiated: 05/25/2022 Target Resolution Date: 08/21/2022 Goal Status: Active Interventions: Assess patient/caregiver ability to obtain necessary supplies Assess patient/caregiver ability to perform ulcer/skin care regimen upon admission and as needed Provide education on ulcer and skin care Treatment Activities: Skin care regimen initiated : 05/25/2022 Topical wound management initiated : 05/25/2022 Notes: Electronic Signature(s) Signed: 07/07/2022 5:29:48 PM By: [name] RN, BSN Entered By: [name] on 07/07/2022 08:22:28 -------------------------------------------------------------------------------- Pain Assessment Details Patient Name: Date of Service: [name], Brandy RO Brandy B. 07/07/2022 8:15 A M Medical Record Number: [id number] Patient Account Number: [id number] Date of Birth/Sex: Treating RN: 07-Jan-1946 (76 y.o. [name] Primary Care Provider: [name] Other Clinician: Referring Provider: Treating Provider/Extender: Hollie Beach in Treatment: 6 Active Problems Location of Pain Severity and Description of Pain Patient Has Paino No Site Locations Rate the pain. Rate the pain. Current Pain Level: 0 Pain Management and Medication Current Pain Management: Medication: No Cold Application: No Rest: No Massage: No Activity: No T.E.N.S.: No Heat Application: No Leg drop or elevation: No Is the Current Pain Management Adequate: Adequate How does your wound impact your activities of daily livingo Sleep: No Bathing: No Appetite: No Relationship With Others: No Bladder Continence: No Emotions: No Bowel Continence: No Work: No Toileting: No Drive: No Dressing: No Hobbies:  No Engineer, maintenance) Signed: 07/07/2022 5:29:48 PM By: [name] RN, BSN Entered By: [name] on 07/07/2022 08:11:53 -------------------------------------------------------------------------------- Patient/Caregiver Education Details Patient Name: Date of Service: [name] RO Brandy B. 9/19/2023andnbsp8:15 A M Medical Record Number: [id number] Patient Account Number: [id number] Date of Birth/Gender: Treating RN: 03/08/1946 (76 y.o. [name] Primary Care Physician: [name] Other Clinician: Referring Physician: Treating Physician/Extender: Hollie Beach in Treatment: 6 Education Assessment Education Provided To: Patient Education Topics Provided Wound/Skin Impairment: Handouts: Skin Care Do's and Dont's Methods: Explain/Verbal Responses: Reinforcements needed Electronic Signature(s) Signed: 07/07/2022 5:29:48 PM By: [name] RN, BSN Entered By: [name] on 07/07/2022 08:22:38 -------------------------------------------------------------------------------- Wound Assessment Details Patient Name: Date of Service: [name], Brandy RO Brandy B. 07/07/2022 8:15 A M Medical Record Number: [id number] Patient Account Number: [id number] Date of Birth/Sex: Treating RN: Aug 05, 1946 (76 y.o. [name], [name] Primary Care Provider: [name] Other Clinician: Referring Provider: Treating Provider/Extender: Hollie Beach in Treatment: 6 Wound Status Wound Number: 2 Primary Trauma, Other Etiology: Wound Location: Left, Lateral Upper Leg Wound Status: Open Wounding Event: Trauma Comorbid Cataracts, Anemia, Congestive Heart Failure, Hypertension, Date Acquired: 06/20/2022 History: Osteoarthritis Weeks Of Treatment: 2 Clustered Wound: No Photos Wound Measurements Length: (cm) 0.6 Width: (cm) 8.8 Depth: (cm) 0.5 Area: (cm) 4.147 Volume: (cm) 2.073 % Reduction in Area: 68% % Reduction in Volume:  20% Epithelialization: None Tunneling: No Undermining: No Wound Description Classification: Full Thickness With Exposed Support Structures Wound Margin: Distinct, outline attached Exudate Amount: Medium Exudate Type: Serosanguineous Exudate Color: red, brown Foul Odor After Cleansing: No Slough/Fibrino Yes Wound Bed Granulation Amount: Small (1-33%) Exposed Structure Granulation Quality: Red, Pink Fascia Exposed: No Necrotic Amount: Large (67-100%) Fat Layer (Subcutaneous Tissue) Exposed: Yes Necrotic Quality: Eschar, Adherent Slough Tendon Exposed: No Muscle Exposed: No Joint Exposed: No Bone Exposed: No Treatment Notes Wound #2 (Upper  Leg) Wound Laterality: Left, Lateral Cleanser Soap and Water Discharge Instruction: May shower and wash wound with dial antibacterial soap and water prior to dressing change. Peri-Wound Care Topical Primary Dressing Dakin's Solution 0.25%, 16 (oz) Discharge Instruction: Moisten gauze with Dakin's solution Secondary Dressing Zetuvit Plus Silicone Border Dressing 7x7(in/in) Discharge Instruction: Apply silicone border over primary dressing as directed. Secured With Compression Wrap Compression Stockings Environmental education officer) Signed: 07/07/2022 5:29:48 PM By: [name] RN, BSN Entered By: [name] on 07/07/2022 08:20:34 -------------------------------------------------------------------------------- Wound Assessment Details Patient Name: Date of Service: [name], Brandy RO Brandy B. 07/07/2022 8:15 A M Medical Record Number: [id number] Patient Account Number: [id number] Date of Birth/Sex: Treating RN: [birth year] (76 y.o. [name], [name] Primary Care Provider: [name] Other Clinician: Referring Provider: Treating Provider/Extender: Hollie Beach in Treatment: 6 Wound Status Wound Number: 3 Primary Trauma, Other Etiology: Wound Location: Right, Lateral Lower Leg Wound Status: Open Wounding  Event: Trauma Comorbid Cataracts, Anemia, Congestive Heart Failure, Hypertension, Date Acquired: 06/20/2022 History: Osteoarthritis Weeks Of Treatment: 2 Clustered Wound: No Photos Wound Measurements Length: (cm) 3.6 Width: (cm) 4.5 Depth: (cm) 0.5 Area: (cm) 12.723 Volume: (cm) 6.362 % Reduction in Area: 67.6% % Reduction in Volume: 19% Epithelialization: None Tunneling: Yes Position (o'clock): 11 Maximum Distance: (cm) 1.4 Undermining: No Wound Description Classification: Full Thickness With Exposed Support Structures Wound Margin: Distinct, outline attached Exudate Amount: Medium Exudate Type: Serosanguineous Exudate Color: red, brown Foul Odor After Cleansing: No Slough/Fibrino Yes Wound Bed Granulation Amount: Small (1-33%) Exposed Structure Granulation Quality: Red, Pink Fascia Exposed: No Necrotic Amount: Large (67-100%) Fat Layer (Subcutaneous Tissue) Exposed: Yes Necrotic Quality: Adherent Slough Tendon Exposed: No Muscle Exposed: No Joint Exposed: No Bone Exposed: No Treatment Notes Wound #3 (Lower Leg) Wound Laterality: Right, Lateral Cleanser Soap and Water Discharge Instruction: May shower and wash wound with dial antibacterial soap and water prior to dressing change. Peri-Wound Care Topical Primary Dressing Dakin's Solution 0.25%, 16 (oz) Discharge Instruction: Moisten gauze with Dakin's solution Secondary Dressing Zetuvit Plus Silicone Border Dressing 7x7(in/in) Discharge Instruction: Apply silicone border over primary dressing as directed. Secured With Compression Wrap Compression Stockings Environmental education officer) Signed: 07/07/2022 5:29:48 PM By: [name] RN, BSN Entered By: [name] on 07/07/2022 08:20:53 -------------------------------------------------------------------------------- Vitals Details Patient Name: Date of Service: [name], Brandy RO Brandy B. 07/07/2022 8:15 A M Medical Record Number: [id number] Patient Account  Number: [id number] Date of Birth/Sex: Treating RN: [birth year] (76 y.o. [name], [name] Primary Care Provider: [name] Other Clinician: Referring Provider: Treating Provider/Extender: Hollie Beach in Treatment: 6 Vital Signs Time Taken: 08:11 Temperature (F): 98.7 Height (in): 63 Pulse (bpm): 81 Respiratory Rate (breaths/min): 20 Blood Pressure (mmHg): 160/103 Reference Range: 80 - 120 mg / dl Electronic Signature(s) Signed: 07/07/2022 5:29:48 PM By: [name] RN, BSN Entered By: [name] on 07/07/2022 08:11:45

## 2022-07-13 ENCOUNTER — Encounter (HOSPITAL_BASED_OUTPATIENT_CLINIC_OR_DEPARTMENT_OTHER): Payer: Medicare HMO | Admitting: Internal Medicine

## 2022-07-14 ENCOUNTER — Encounter (HOSPITAL_BASED_OUTPATIENT_CLINIC_OR_DEPARTMENT_OTHER): Payer: Medicare HMO | Admitting: Internal Medicine

## 2022-07-14 DIAGNOSIS — E1122 Type 2 diabetes mellitus with diabetic chronic kidney disease: Secondary | ICD-10-CM | POA: Diagnosis not present

## 2022-07-14 DIAGNOSIS — I509 Heart failure, unspecified: Secondary | ICD-10-CM | POA: Diagnosis not present

## 2022-07-14 DIAGNOSIS — L97822 Non-pressure chronic ulcer of other part of left lower leg with fat layer exposed: Secondary | ICD-10-CM

## 2022-07-14 DIAGNOSIS — I87313 Chronic venous hypertension (idiopathic) with ulcer of bilateral lower extremity: Secondary | ICD-10-CM

## 2022-07-14 DIAGNOSIS — L97812 Non-pressure chronic ulcer of other part of right lower leg with fat layer exposed: Secondary | ICD-10-CM | POA: Diagnosis not present

## 2022-07-14 DIAGNOSIS — E11622 Type 2 diabetes mellitus with other skin ulcer: Secondary | ICD-10-CM | POA: Diagnosis not present

## 2022-07-14 DIAGNOSIS — M1990 Unspecified osteoarthritis, unspecified site: Secondary | ICD-10-CM | POA: Diagnosis not present

## 2022-07-14 DIAGNOSIS — I872 Venous insufficiency (chronic) (peripheral): Secondary | ICD-10-CM | POA: Diagnosis not present

## 2022-07-14 DIAGNOSIS — I130 Hypertensive heart and chronic kidney disease with heart failure and stage 1 through stage 4 chronic kidney disease, or unspecified chronic kidney disease: Secondary | ICD-10-CM | POA: Diagnosis not present

## 2022-07-14 DIAGNOSIS — N183 Chronic kidney disease, stage 3 unspecified: Secondary | ICD-10-CM | POA: Diagnosis not present

## 2022-07-14 NOTE — Progress Notes (Signed)
KARNE, OZGA ([id number]) Visit Report for 07/14/2022 Arrival Information Details Patient Name: Date of Service: [name] 07/14/2022 8:15 A M Medical Record Number: [id number] Patient Account Number: [id number] Date of Birth/Sex: Treating RN: 23-Dec-1945 (76 y.o. Female) [name] Primary Care Oddie Bottger: [name] Other Clinician: Referring Charitie Hinote: Treating Yifan Auker/Extender: Hollie Beach in Treatment: 7 Visit Information History Since Last Visit Added or deleted any medications: No Patient Arrived: [name] Any new allergies or adverse reactions: No Arrival Time: 08:03 Had a fall or experienced change in No Accompanied By: son activities of daily living that may affect Transfer Assistance: None risk of falls: Patient Identification Verified: Yes Signs or symptoms of abuse/neglect since last visito No Secondary Verification Process Completed: Yes Hospitalized since last visit: No Patient Requires Transmission-Based Precautions: No Implantable device outside of the clinic excluding No Patient Has Alerts: No cellular tissue based products placed in the center since last visit: Has Dressing in Place as Prescribed: Yes Pain Present Now: No Electronic Signature(s) Signed: 07/14/2022 5:27:41 PM By: [name] RN, BSN Entered By: [name] on 07/14/2022 08:07:17 -------------------------------------------------------------------------------- Compression Therapy Details Patient Name: Date of Service: [name], Brandy RO [name] 07/14/2022 8:15 A M Medical Record Number: [id number] Patient Account Number: [id number] Date of Birth/Sex: Treating RN: [birth year] (76 y.o. Female) [name] Primary Care Jatia Musa: [name] Other Clinician: Referring Cyle Kenyon: Treating Kynnadi Dicenso/Extender: Hollie Beach in Treatment: 7 Compression Therapy Performed for Wound Assessment: Wound #3 Right,Lateral Lower Leg Performed By:  Clinician [name], RN Compression Type: Double Layer Post Procedure Diagnosis Same as Pre-procedure Electronic Signature(s) Signed: 07/14/2022 5:27:41 PM By: [name] RN, BSN Entered By: [name] on 07/14/2022 08:44:38 -------------------------------------------------------------------------------- Encounter Discharge Information Details Patient Name: Date of Service: [name], Brandy RO [name] 07/14/2022 8:15 A M Medical Record Number: [id number] Patient Account Number: [id number] Date of Birth/Sex: Treating RN: [birth year] (76 y.o. Female) [name] Primary Care Peyton Spengler: [name] Other Clinician: Referring Chukwudi Ewen: Treating Kristine Chahal/Extender: Hollie Beach in Treatment: 7 Encounter Discharge Information Items Post Procedure Vitals Discharge Condition: Stable Temperature (F): 98.6 Ambulatory Status: Walker Pulse (bpm): 84 Discharge Destination: Home Respiratory Rate (breaths/min): 20 Transportation: Private Auto Blood Pressure (mmHg): 131/79 Accompanied By: son Schedule Follow-up Appointment: Yes Clinical Summary of Care: Electronic Signature(s) Signed: 07/14/2022 5:27:41 PM By: [name] RN, BSN Entered By: [name] on 07/14/2022 08:46:25 -------------------------------------------------------------------------------- Lower Extremity Assessment Details Patient Name: Date of Service: Brandy Busing RO [name] 07/14/2022 8:15 A M Medical Record Number: [id number] Patient Account Number: [id number] Date of Birth/Sex: Treating RN: [birth year] (76 y.o. Female) [name] Primary Care Channin Agustin: [name] Other Clinician: Referring Shaunte Weissinger: Treating Volney Reierson/Extender: Hollie Beach in Treatment: 7 Edema Assessment Assessed: [Left: No] [Right: Yes] Edema: [Left: Yes] [Right: Yes] Calf Left: Right: Point of Measurement: 41 cm From Medial Instep 42 cm Ankle Left: Right: Point of Measurement: 8 cm From  Medial Instep 25.5 cm Vascular Assessment Pulses: Dorsalis Pedis Palpable: [Left:Yes] [Right:Yes] Electronic Signature(s) Signed: 07/14/2022 5:27:41 PM By: [name] RN, BSN Entered By: [name] on 07/14/2022 08:13:13 -------------------------------------------------------------------------------- Multi Wound Chart Details Patient Name: Date of Service: [name], Brandy RO [name] 07/14/2022 8:15 A M Medical Record Number: [id number] Patient Account Number: [id number] Date of Birth/Sex: Treating RN: 06-17-1946 (76 y.o. Female) Primary Care Nashay Brickley: [name] Other Clinician: Referring Shakendra Griffeth: Treating Rhayne Chatwin/Extender: Hollie Beach in Treatment: 7 Vital Signs Height(in): 63 Pulse(bpm):  84 Weight(lbs): Blood Pressure(mmHg): 131/79 Body Mass Index(BMI): Temperature(F): 98.6 Respiratory Rate(breaths/min): 20 Photos: [N/A:N/A] Left, Lateral Upper Leg Right, Lateral Lower Leg N/A Wound Location: Trauma Trauma N/A Wounding Event: Trauma, Other Trauma, Other N/A Primary Etiology: Cataracts, Anemia, Congestive Heart Cataracts, Anemia, Congestive Heart N/A Comorbid History: Failure, Hypertension, Osteoarthritis Failure, Hypertension, Osteoarthritis 06/20/2022 06/20/2022 N/A Date Acquired: 3 3 N/A Weeks of Treatment: Open Open N/A Wound Status: No No N/A Wound Recurrence: 0.7x4x0.5 3.2x4.5x0.9 N/A Measurements L x W x D (cm) 2.199 11.31 N/A A (cm) : rea 1.1 10.179 N/A Volume (cm) : 83.00% 71.20% N/A % Reduction in A rea: 57.60% -29.60% N/A % Reduction in Volume: 12 Position 1 (o'clock): 1.4 Maximum Distance 1 (cm): No Yes N/A Tunneling: Full Thickness With Exposed Support Full Thickness With Exposed Support N/A Classification: Structures Structures Medium Medium N/A Exudate A mount: Serosanguineous Serosanguineous N/A Exudate Type: red, brown red, brown N/A Exudate Color: Distinct, outline attached Distinct, outline  attached N/A Wound Margin: Large (67-100%) Small (1-33%) N/A Granulation A mount: Red, Pink Red, Pink N/A Granulation Quality: Small (1-33%) Large (67-100%) N/A Necrotic A mount: Fat Layer (Subcutaneous Tissue): Yes Fat Layer (Subcutaneous Tissue): Yes N/A Exposed Structures: Fascia: No Fascia: No Tendon: No Tendon: No Muscle: No Muscle: No Joint: No Joint: No Bone: No Bone: No Medium (34-66%) None N/A Epithelialization: Debridement - Excisional Debridement - Excisional N/A Debridement: Pre-procedure Verification/Time Out 08:32 08:32 N/A Taken: Lidocaine 5% topical ointment Lidocaine 5% topical ointment N/A Pain Control: Subcutaneous, Slough Subcutaneous, Slough N/A Tissue Debrided: Skin/Subcutaneous Tissue Skin/Subcutaneous Tissue N/A Level: 2.8 14.4 N/A Debridement A (sq cm): rea Curette Curette N/A Instrument: Minimum Minimum N/A Bleeding: Pressure Pressure N/A Hemostasis A chieved: 0 0 N/A Procedural Pain: 0 0 N/A Post Procedural Pain: Procedure was tolerated well Procedure was tolerated well N/A Debridement Treatment Response: 0.7x4x1.4 3.2x4.5x0.9 N/A Post Debridement Measurements L x W x D (cm) 3.079 10.179 N/A Post Debridement Volume: (cm) Debridement Compression Therapy N/A Procedures Performed: Debridement Treatment Notes Wound #2 (Upper Leg) Wound Laterality: Left, Lateral Cleanser dakin's solution Discharge Instruction: clean with dakins Peri-Wound Care Topical Primary Dressing MediHoney Gel, tube 1.5 (oz) Discharge Instruction: Apply to wound bed as instructed Secondary Dressing Woven Gauze Sponges 2x2 in Discharge Instruction: Apply over primary dressing as directed. Zetuvit Plus Silicone Border Dressing 7x7(in/in) Discharge Instruction: Apply silicone border over primary dressing as directed. Secured With Compression Wrap Compression Stockings Add-Ons Wound #3 (Lower Leg) Wound Laterality: Right, Lateral Cleanser Soap and  Water Discharge Instruction: May shower and wash wound with dial antibacterial soap and water prior to dressing change. Peri-Wound Care Sween Lotion (Moisturizing lotion) Discharge Instruction: Apply moisturizing lotion as directed Topical Gentamicin Discharge Instruction: As directed by physician Mupirocin Ointment Discharge Instruction: Apply Mupirocin (Bactroban) as instructed Primary Dressing Hydrofera Blue Classic Foam, 4x4 in Discharge Instruction: Moisten with saline prior to applying to wound bed Secondary Dressing Woven Gauze Sponges 2x2 in Discharge Instruction: Apply over primary dressing as directed. Zetuvit Plus 4x8 in Discharge Instruction: Apply over primary dressing as directed. Secured With Compression Wrap CoFlex TLC XL 2-layer Compression System 4x7 (in/yd) Discharge Instruction: Apply CoFlex 2-layer compression as directed. (alt for 4 layer) Compression Stockings Add-Ons Electronic Signature(s) Signed: 07/14/2022 11:12:17 AM By: Kalman Shan DO Entered By: Kalman Shan on 07/14/2022 09:29:22 -------------------------------------------------------------------------------- Multi-Disciplinary Care Plan Details Patient Name: Date of Service: [name], Brandy RO [name] 07/14/2022 8:15 A M Medical Record Number: [id number] Patient Account Number: [id number] Date of Birth/Sex: Treating RN: [birth year] (76 y.o.  Female) [name] Primary Care Florentino Laabs: [name] Other Clinician: Referring Verenice Westrich: Treating [place] Leap/Extender: Hollie Beach in Treatment: 7 Active Inactive Pain, Acute or Chronic Nursing Diagnoses: Pain, acute or chronic: actual or potential Potential alteration in comfort, pain Goals: Patient will verbalize adequate pain control and receive pain control interventions during procedures as needed Date Initiated: 05/25/2022 Target Resolution Date: 08/04/2022 Goal Status: Active Patient/caregiver will verbalize comfort  level met Date Initiated: 05/25/2022 Target Resolution Date: 08/05/2022 Goal Status: Active Interventions: Complete pain assessment as per visit requirements Encourage patient to take pain medications as prescribed Provide education on pain management Treatment Activities: Administer pain control measures as ordered : 05/25/2022 Notes: Venous Leg Ulcer Nursing Diagnoses: Knowledge deficit related to disease process and management Goals: Non-invasive venous studies are completed as ordered Date Initiated: 05/25/2022 Target Resolution Date: 07/17/2022 Goal Status: Active Interventions: Assess peripheral edema status every visit. Provide education on venous insufficiency Treatment Activities: Non-invasive vascular studies : 05/25/2022 Notes: Wound/Skin Impairment Nursing Diagnoses: Knowledge deficit related to ulceration/compromised skin integrity Goals: Patient/caregiver will verbalize understanding of skin care regimen Date Initiated: 05/25/2022 Target Resolution Date: 07/29/2022 Goal Status: Active Ulcer/skin breakdown will heal within 14 weeks Date Initiated: 05/25/2022 Target Resolution Date: 08/21/2022 Goal Status: Active Interventions: Assess patient/caregiver ability to obtain necessary supplies Assess patient/caregiver ability to perform ulcer/skin care regimen upon admission and as needed Provide education on ulcer and skin care Treatment Activities: Skin care regimen initiated : 05/25/2022 Topical wound management initiated : 05/25/2022 Notes: Electronic Signature(s) Signed: 07/14/2022 5:27:41 PM By: [name] RN, BSN Entered By: [name] on 07/14/2022 08:15:01 -------------------------------------------------------------------------------- Pain Assessment Details Patient Name: Date of Service: [name], Brandy RO [name] 07/14/2022 8:15 A M Medical Record Number: [id number] Patient Account Number: [id number] Date of Birth/Sex: Treating RN: 07-09-1946 (76 y.o. Female)  [name] Primary Care Rohaan Durnil: [name] Other Clinician: Referring Yaire Kreher: Treating Kyrus Hyde/Extender: Hollie Beach in Treatment: 7 Active Problems Location of Pain Severity and Description of Pain Patient Has Paino No Site Locations Pain Management and Medication Current Pain Management: Electronic Signature(s) Signed: 07/14/2022 5:27:41 PM By: [name] RN, BSN Entered By: [name] on 07/14/2022 08:07:40 -------------------------------------------------------------------------------- Patient/Caregiver Education Details Patient Name: Date of Service: Brandy Busing RO [name] 9/26/2023andnbsp8:15 A M Medical Record Number: [id number] Patient Account Number: [id number] Date of Birth/Gender: Treating RN: [birth year] (76 y.o. Female) [name] Primary Care Physician: [name] Other Clinician: Referring Physician: Treating Physician/Extender: Hollie Beach in Treatment: 7 Education Assessment Education Provided To: Patient Education Topics Provided Pain: Handouts: A Guide to Pain Control Methods: Explain/Verbal Responses: Reinforcements needed Wound Debridement: Handouts: Wound Debridement Methods: Explain/Verbal Responses: Reinforcements needed Electronic Signature(s) Signed: 07/14/2022 5:27:41 PM By: [name] RN, BSN Entered By: [name] on 07/14/2022 08:15:43 -------------------------------------------------------------------------------- Wound Assessment Details Patient Name: Date of Service: [name], Brandy RO [name] 07/14/2022 8:15 A M Medical Record Number: [id number] Patient Account Number: [id number] Date of Birth/Sex: Treating RN: 1946-09-27 (76 y.o. Female) [name] Primary Care Alias Villagran: [name] Other Clinician: Referring Reginna Sermeno: Treating Nyiesha Beever/Extender: Hollie Beach in Treatment: 7 Wound Status Wound Number: 2 Primary Trauma,  Other Etiology: Wound Location: Left, Lateral Upper Leg Wound Status: Open Wounding Event: Trauma Comorbid Cataracts, Anemia, Congestive Heart Failure, Hypertension, Date Acquired: 06/20/2022 History: Osteoarthritis Weeks Of Treatment: 3 Clustered Wound: No Photos Wound Measurements Length: (cm) 0.7 Width: (cm) 4 Depth: (cm) 0.5 Area: (cm) 2.199 Volume: (cm) 1.1 % Reduction in Area:  83% % Reduction in Volume: 57.6% Epithelialization: Medium (34-66%) Tunneling: No Undermining: No Wound Description Classification: Full Thickness With Exposed Support Structures Wound Margin: Distinct, outline attached Exudate Amount: Medium Exudate Type: Serosanguineous Exudate Color: red, brown Wound Bed Granulation Amount: Large (67-100%) Granulation Quality: Red, Pink Necrotic Amount: Small (1-33%) Necrotic Quality: Adherent Slough Foul Odor After Cleansing: No Slough/Fibrino Yes Exposed Structure Fascia Exposed: No Fat Layer (Subcutaneous Tissue) Exposed: Yes Tendon Exposed: No Muscle Exposed: No Joint Exposed: No Bone Exposed: No Treatment Notes Wound #2 (Upper Leg) Wound Laterality: Left, Lateral Cleanser dakin's solution Discharge Instruction: clean with dakins Peri-Wound Care Topical Primary Dressing MediHoney Gel, tube 1.5 (oz) Discharge Instruction: Apply to wound bed as instructed Secondary Dressing Woven Gauze Sponges 2x2 in Discharge Instruction: Apply over primary dressing as directed. Zetuvit Plus Silicone Border Dressing 7x7(in/in) Discharge Instruction: Apply silicone border over primary dressing as directed. Secured With Compression Wrap Compression Stockings Environmental education officer) Signed: 07/14/2022 5:27:41 PM By: [name] RN, BSN Entered By: [name] on 07/14/2022 08:14:08 -------------------------------------------------------------------------------- Wound Assessment Details Patient Name: Date of Service: Brandy Busing RO [name]  07/14/2022 8:15 A M Medical Record Number: [id number] Patient Account Number: [id number] Date of Birth/Sex: Treating RN: July 13, 1946 (76 y.o. Female) [name] Primary Care Quentina Fronek: [name] Other Clinician: Referring Vardaan Depascale: Treating Keyia Moretto/Extender: Hollie Beach in Treatment: 7 Wound Status Wound Number: 3 Primary Trauma, Other Etiology: Wound Location: Right, Lateral Lower Leg Wound Status: Open Wounding Event: Trauma Comorbid Cataracts, Anemia, Congestive Heart Failure, Hypertension, Date Acquired: 06/20/2022 History: Osteoarthritis Weeks Of Treatment: 3 Clustered Wound: No Photos Wound Measurements Length: (cm) 3.2 Width: (cm) 4.5 Depth: (cm) 0.9 Area: (cm) 11.31 Volume: (cm) 10.179 % Reduction in Area: 71.2% % Reduction in Volume: -29.6% Epithelialization: None Tunneling: Yes Position (o'clock): 12 Maximum Distance: (cm) 1.4 Undermining: No Wound Description Classification: Full Thickness With Exposed Support Struct Wound Margin: Distinct, outline attached Exudate Amount: Medium Exudate Type: Serosanguineous Exudate Color: red, brown ures Foul Odor After Cleansing: No Slough/Fibrino Yes Wound Bed Granulation Amount: Small (1-33%) Exposed Structure Granulation Quality: Red, Pink Fascia Exposed: No Necrotic Amount: Large (67-100%) Fat Layer (Subcutaneous Tissue) Exposed: Yes Necrotic Quality: Adherent Slough Tendon Exposed: No Muscle Exposed: No Joint Exposed: No Bone Exposed: No Treatment Notes Wound #3 (Lower Leg) Wound Laterality: Right, Lateral Cleanser Soap and Water Discharge Instruction: May shower and wash wound with dial antibacterial soap and water prior to dressing change. Peri-Wound Care Sween Lotion (Moisturizing lotion) Discharge Instruction: Apply moisturizing lotion as directed Topical Gentamicin Discharge Instruction: As directed by physician Mupirocin Ointment Discharge Instruction: Apply Mupirocin  (Bactroban) as instructed Primary Dressing Hydrofera Blue Classic Foam, 4x4 in Discharge Instruction: Moisten with saline prior to applying to wound bed Secondary Dressing Woven Gauze Sponges 2x2 in Discharge Instruction: Apply over primary dressing as directed. Zetuvit Plus 4x8 in Discharge Instruction: Apply over primary dressing as directed. Secured With Compression Wrap CoFlex TLC XL 2-layer Compression System 4x7 (in/yd) Discharge Instruction: Apply CoFlex 2-layer compression as directed. (alt for 4 layer) Compression Stockings Add-Ons Electronic Signature(s) Signed: 07/14/2022 5:27:41 PM By: [name] RN, BSN Entered By: [name] on 07/14/2022 08:14:25 -------------------------------------------------------------------------------- Vitals Details Patient Name: Date of Service: [name], Brandy RO [name] 07/14/2022 8:15 A M Medical Record Number: [id number] Patient Account Number: [id number] Date of Birth/Sex: Treating RN: 15-Sep-1946 (76 y.o. Female) [name] Primary Care Cashae Weich: [name] Other Clinician: Referring Jessica Seidman: Treating Mabell Esguerra/Extender: Hollie Beach in Treatment: 7 Vital Signs  Time Taken: 08:06 Temperature (F): 98.6 Height (in): 63 Pulse (bpm): 84 Respiratory Rate (breaths/min): 20 Blood Pressure (mmHg): 131/79 Reference Range: 80 - 120 mg / dl Electronic Signature(s) Signed: 07/14/2022 5:27:41 PM By: [name] RN, BSN Entered By: [name] on 07/14/2022 08:07:35

## 2022-07-14 NOTE — Progress Notes (Signed)
GLORIANN, RIEDE ([id number]) Visit Report for 07/14/2022 Chief Complaint Document Details Patient Name: Date of Service: [name] 07/14/2022 8:15 A M Medical Record Number: [id number] Patient Account Number: [id number] Date of Birth/Sex: Treating RN: Oct 25, 1945 (76 y.o. Female) Primary Care Provider: [name] Other Clinician: Referring Provider: Treating Provider/Extender: Hollie Beach in Treatment: 7 Information Obtained from: Patient Chief Complaint 05/25/2022; left lower extremity wound following a dog scratch 06/23/2022; lacerations to the right and left lower extremity status post fall Electronic Signature(s) Signed: 07/14/2022 11:12:17 AM By: Kalman Shan DO Entered By: Kalman Shan on 07/14/2022 09:30:27 -------------------------------------------------------------------------------- Debridement Details Patient Name: Date of Service: [name], Brandy RO [name] 07/14/2022 8:15 A M Medical Record Number: [id number] Patient Account Number: [id number] Date of Birth/Sex: Treating RN: [birth year] (76 y.o. Female) Rolin Barry, [place] Primary Care Provider: [name] Other Clinician: Referring Provider: Treating Provider/Extender: Hollie Beach in Treatment: 7 Debridement Performed for Assessment: Wound #2 Left,Lateral Upper Leg Performed By: Physician Kalman Shan, DO Debridement Type: Debridement Level of Consciousness (Pre-procedure): Awake and Alert Pre-procedure Verification/Time Out Yes - 08:32 Taken: Start Time: 08:33 Pain Control: Lidocaine 5% topical ointment T Area Debrided (L x W): otal 0.7 (cm) x 4 (cm) = 2.8 (cm) Tissue and other material debrided: Viable, Non-Viable, Slough, Subcutaneous, Skin: Dermis , Skin: Epidermis, Slough Level: Skin/Subcutaneous Tissue Debridement Description: Excisional Instrument: Curette Bleeding: Minimum Hemostasis Achieved: Pressure End Time: 08:38 Procedural Pain: 0 Post  Procedural Pain: 0 Response to Treatment: Procedure was tolerated well Level of Consciousness (Post- Awake and Alert procedure): Post Debridement Measurements of Total Wound Length: (cm) 0.7 Width: (cm) 4 Depth: (cm) 1.4 Volume: (cm) 3.079 Character of Wound/Ulcer Post Debridement: Improved Post Procedure Diagnosis Same as Pre-procedure Electronic Signature(s) Signed: 07/14/2022 11:12:17 AM By: Kalman Shan DO Signed: 07/14/2022 5:27:41 PM By: Brandy Pilling RN, BSN Entered By: [name] on 07/14/2022 08:40:05 -------------------------------------------------------------------------------- Debridement Details Patient Name: Date of Service: [name], Brandy RO [name] 07/14/2022 8:15 A M Medical Record Number: [id number] Patient Account Number: [id number] Date of Birth/Sex: Treating RN: Mar 14, 1946 (76 y.o. Female) [name] Primary Care Provider: [name] Other Clinician: Referring Provider: Treating Provider/Extender: Hollie Beach in Treatment: 7 Debridement Performed for Assessment: Wound #3 Right,Lateral Lower Leg Performed By: Physician Kalman Shan, DO Debridement Type: Debridement Level of Consciousness (Pre-procedure): Awake and Alert Pre-procedure Verification/Time Out Yes - 08:32 Taken: Start Time: 08:33 Pain Control: Lidocaine 5% topical ointment T Area Debrided (L x W): otal 3.2 (cm) x 4.5 (cm) = 14.4 (cm) Tissue and other material debrided: Viable, Non-Viable, Slough, Subcutaneous, Skin: Dermis , Skin: Epidermis, Slough Level: Skin/Subcutaneous Tissue Debridement Description: Excisional Instrument: Curette Bleeding: Minimum Hemostasis Achieved: Pressure End Time: 08:38 Procedural Pain: 0 Post Procedural Pain: 0 Response to Treatment: Procedure was tolerated well Level of Consciousness (Post- Awake and Alert procedure): Post Debridement Measurements of Total Wound Length: (cm) 3.2 Width: (cm) 4.5 Depth: (cm)  0.9 Volume: (cm) 10.179 Character of Wound/Ulcer Post Debridement: Improved Post Procedure Diagnosis Same as Pre-procedure Electronic Signature(s) Signed: 07/14/2022 11:12:17 AM By: Kalman Shan DO Signed: 07/14/2022 5:27:41 PM By: Brandy Pilling RN, BSN Entered By: [name] on 07/14/2022 08:44:21 -------------------------------------------------------------------------------- HPI Details Patient Name: Date of Service: [name], Brandy RO [name] 07/14/2022 8:15 A M Medical Record Number: [id number] Patient Account Number: [id number] Date of Birth/Sex: Treating RN: [birth year] (76 y.o. Female) Primary Care Provider: Other Clinician: [name] Referring Provider: Treating Provider/Extender: Ihor Dow,  Casper Harrison in Treatment: 7 History of Present Illness HPI Description: Admission 05/25/2022 [name] is a 76 year old female with a past medical history of diet-controlled type 2 diabetes, chronic 3 stage kidney disease and venous insufficiency that presents to the clinic for a 1 month history of nonhealing ulcer to the left leg. She states that her dog scratched her causing a wound. She has developed cellulitis in this leg and has been treated with clindamycin by her primary care physician. She reports improvement in her symptoms. She currently denies signs of infection. She has been keeping the area covered. She does not wear compression stockings. She is on 80 mg of Lasix twice daily. She has had reflux studies to the left leg on 07/2021 that notes venous reflux throughout the left common femoral vein and greater saphenous vein. She has not had an ablation. She follows with vein and vascular for her venous insufficiency. 8/14; patient presents for follow-up. She states that the wrap stayed on for 4 days and eventually slid down. She has been wearing her compression stocking and doing dressing changes with Hydrofera Blue since. She also states she has a hard time  putting on her shoes with the 3 layer compression. She denies signs of infection. 8/21; patient presents for follow-up. She again had trouble with the wrap sliding down. We have been using Hydrofera Blue with gentamicin under 2 layer Coflex. She currently denies signs of infection. 8/28; patient presents for follow-up. She reports taking the wrap off yesterday. We have been using Hydrofera Blue with gentamicin under 2 layer Coflex. She states that the wrap does slide down but remains over the wound. She is currently wearing her compression stocking. She would like to do this instead of the compression wrap. 9/5; patient presents for follow-up. She has been using Medihoney to the left lower extremity remedy wound with her compression stockings. She reports no drainage from the site. Unfortunately she fell over the weekend and had to go to the ED due to lacerations she experienced on her left and right lower extremity. On the left leg she had 10 sutures placed on the right leg she had 7. She was given Keflex. Today she reports no signs of infection. She has been using Xeroform to the suture sites. 9/11; patient presents for follow-up. She has been using bacitracin ointment to the suture line. She currently denies signs of infection. 9/19; patient presents for follow-up. She has been using Medihoney to the wound beds. She denies signs of infection. 9/26; patient presents for follow-up. She has been using Dakin's wet-to-dry dressings to the wound beds. She reports improvement in wound healing. She has no issues or complaints today. Electronic Signature(s) Signed: 07/14/2022 11:12:17 AM By: Kalman Shan DO Entered By: Kalman Shan on 07/14/2022 09:34:47 -------------------------------------------------------------------------------- Physical Exam Details Patient Name: Date of Service: [name] RO [name] 07/14/2022 8:15 A M Medical Record Number: [id number] Patient Account Number:  [id number] Date of Birth/Sex: Treating RN: 01/16/1946 (76 y.o. Female) Primary Care Provider: [name] Other Clinician: Referring Provider: Treating Provider/Extender: Hollie Beach in Treatment: 7 Constitutional respirations regular, non-labored and within target range for patient.. Cardiovascular 2+ dorsalis pedis/posterior tibialis pulses. Psychiatric pleasant and cooperative. Notes Dehisced wounds to both lower extremities with granulation tissue, nonviable tissue and subcutaneous fat. 2+ pitting edema to the knees. No signs of surrounding soft tissue infection. Electronic Signature(s) Signed: 07/14/2022 11:12:17 AM By: Kalman Shan DO Entered By: Kalman Shan on 07/14/2022 09:35:07 -------------------------------------------------------------------------------- Physician  Orders Details Patient Name: Date of Service: Brandy Corolla [name] 07/14/2022 8:15 A M Medical Record Number: [id number] Patient Account Number: [id number] Date of Birth/Sex: Treating RN: 1946-09-02 (76 y.o. Female) [name] Primary Care Provider: [name] Other Clinician: Referring Provider: Treating Provider/Extender: Hollie Beach in Treatment: 7 Verbal / Phone Orders: No Diagnosis Coding ICD-10 Coding Code Description [phone] Chronic venous hypertension (idiopathic) with ulcer of left lower extremity W54.8XXA Other contact with dog, initial encounter [phone] Non-pressure chronic ulcer of other part of left lower leg with fat layer exposed E11.622 Type 2 diabetes mellitus with other skin ulcer T79.8XXA Other early complications of trauma, initial encounter S81.801A Unspecified open wound, right lower leg, initial encounter S81.802A Unspecified open wound, left lower leg, initial encounter Follow-up Appointments ppointment in 1 week. - Dr. Heber [place] and [place], Room 8 Tuesday Return A ppointment in 2 weeks. - Dr. Heber [place] and [place], Room 8  Tuesday Return A Anesthetic (In clinic) Topical Lidocaine 5% applied to wound bed Bathing/ Shower/ Hygiene May shower with protection but do not get wound dressing(s) wet. Edema Control - Lymphedema / SCD / Other Elevate legs to the level of the heart or above for 30 minutes daily and/or when sitting, a frequency of: - 2-3 times a day throughout the day. Avoid standing for long periods of time. Exercise regularly Moisturize legs daily. - lotion both legs every night before bed. Compression stocking or Garment 20-30 mm/Hg pressure to: - apply in the morning and remove at night. Wound Treatment Wound #2 - Upper Leg Wound Laterality: Left, Lateral Cleanser: dakin's solution 1 x Per Day/30 Days Discharge Instructions: clean with dakins Prim Dressing: MediHoney Gel, tube 1.5 (oz) 1 x Per Day/30 Days ary Discharge Instructions: Apply to wound bed as instructed Secondary Dressing: Woven Gauze Sponges 2x2 in 1 x Per Day/30 Days Discharge Instructions: Apply over primary dressing as directed. Secondary Dressing: Zetuvit Plus Silicone Border Dressing 7x7(in/in) 1 x Per Day/30 Days Discharge Instructions: Apply silicone border over primary dressing as directed. Wound #3 - Lower Leg Wound Laterality: Right, Lateral Cleanser: Soap and Water 1 x Per Week/30 Days Discharge Instructions: May shower and wash wound with dial antibacterial soap and water prior to dressing change. Peri-Wound Care: Sween Lotion (Moisturizing lotion) 1 x Per Week/30 Days Discharge Instructions: Apply moisturizing lotion as directed Topical: Gentamicin 1 x Per Week/30 Days Discharge Instructions: As directed by physician Topical: Mupirocin Ointment 1 x Per Week/30 Days Discharge Instructions: Apply Mupirocin (Bactroban) as instructed Prim Dressing: Hydrofera Blue Classic Foam, 4x4 in 1 x Per Week/30 Days ary Discharge Instructions: Moisten with saline prior to applying to wound bed Secondary Dressing: Woven Gauze Sponges  2x2 in 1 x Per Week/30 Days Discharge Instructions: Apply over primary dressing as directed. Secondary Dressing: Zetuvit Plus 4x8 in 1 x Per Week/30 Days Discharge Instructions: Apply over primary dressing as directed. Compression Wrap: CoFlex TLC XL 2-layer Compression System 4x7 (in/yd) 1 x Per Week/30 Days Discharge Instructions: Apply CoFlex 2-layer compression as directed. (alt for 4 layer) Electronic Signature(s) Signed: 07/14/2022 11:12:17 AM By: Kalman Shan DO Entered By: Kalman Shan on 07/14/2022 09:35:14 -------------------------------------------------------------------------------- Problem List Details Patient Name: Date of Service: [name], Brandy RO [name] 07/14/2022 8:15 A M Medical Record Number: [id number] Patient Account Number: [id number] Date of Birth/Sex: Treating RN: July 16, 1946 (76 y.o. Female) [name] Primary Care Provider: [name] Other Clinician: Referring Provider: Treating Provider/Extender: Hollie Beach in Treatment: 7 Active Problems ICD-10  Encounter Code Description Active Date MDM Diagnosis L97.822 Non-pressure chronic ulcer of other part of left lower leg with fat layer exposed8/04/2022 No Yes L97.812 Non-pressure chronic ulcer of other part of right lower leg with fat layer 07/14/2022 No Yes exposed I87.313 Chronic venous hypertension (idiopathic) with ulcer of bilateral lower extremity 07/14/2022 No Yes E11.622 Type 2 diabetes mellitus with other skin ulcer 05/25/2022 No Yes T79.8XXA Other early complications of trauma, initial encounter 06/23/2022 No Yes S81.801A Unspecified open wound, right lower leg, initial encounter 06/23/2022 No Yes S81.802A Unspecified open wound, left lower leg, initial encounter 06/23/2022 No Yes Inactive Problems Resolved Problems ICD-10 Code Description Active Date Resolved Date W54.8XXA Other contact with dog, initial encounter 05/25/2022 05/25/2022 Electronic Signature(s) Signed: 07/14/2022  11:12:17 AM By: Kalman Shan DO Entered By: Kalman Shan on 07/14/2022 09:29:15 -------------------------------------------------------------------------------- Progress Note Details Patient Name: Date of Service: [name], Brandy RO [name] 07/14/2022 8:15 A M Medical Record Number: [id number] Patient Account Number: [id number] Date of Birth/Sex: Treating RN: 12/11/1945 (76 y.o. Female) Primary Care Provider: [name] Other Clinician: Referring Provider: Treating Provider/Extender: Hollie Beach in Treatment: 7 Subjective Chief Complaint Information obtained from Patient 05/25/2022; left lower extremity wound following a dog scratch 06/23/2022; lacerations to the right and left lower extremity status post fall History of Present Illness (HPI) Admission 05/25/2022 Ms. Sacoya Mcgourty is a 76 year old female with a past medical history of diet-controlled type 2 diabetes, chronic 3 stage kidney disease and venous insufficiency that presents to the clinic for a 1 month history of nonhealing ulcer to the left leg. She states that her dog scratched her causing a wound. She has developed cellulitis in this leg and has been treated with clindamycin by her primary care physician. She reports improvement in her symptoms. She currently denies signs of infection. She has been keeping the area covered. She does not wear compression stockings. She is on 80 mg of Lasix twice daily. She has had reflux studies to the left leg on 07/2021 that notes venous reflux throughout the left common femoral vein and greater saphenous vein. She has not had an ablation. She follows with vein and vascular for her venous insufficiency. 8/14; patient presents for follow-up. She states that the wrap stayed on for 4 days and eventually slid down. She has been wearing her compression stocking and doing dressing changes with Hydrofera Blue since. She also states she has a hard time putting on her shoes with  the 3 layer compression. She denies signs of infection. 8/21; patient presents for follow-up. She again had trouble with the wrap sliding down. We have been using Hydrofera Blue with gentamicin under 2 layer Coflex. She currently denies signs of infection. 8/28; patient presents for follow-up. She reports taking the wrap off yesterday. We have been using Hydrofera Blue with gentamicin under 2 layer Coflex. She states that the wrap does slide down but remains over the wound. She is currently wearing her compression stocking. She would like to do this instead of the compression wrap. 9/5; patient presents for follow-up. She has been using Medihoney to the left lower extremity remedy wound with her compression stockings. She reports no drainage from the site. Unfortunately she fell over the weekend and had to go to the ED due to lacerations she experienced on her left and right lower extremity. On the left leg she had 10 sutures placed on the right leg she had 7. She was given Keflex. Today she reports no signs of infection.  She has been using Xeroform to the suture sites. 9/11; patient presents for follow-up. She has been using bacitracin ointment to the suture line. She currently denies signs of infection. 9/19; patient presents for follow-up. She has been using Medihoney to the wound beds. She denies signs of infection. 9/26; patient presents for follow-up. She has been using Dakin's wet-to-dry dressings to the wound beds. She reports improvement in wound healing. She has no issues or complaints today. Patient History Information obtained from Patient. Family History Cancer - Siblings, Diabetes - Siblings, Heart Disease - Mother, Hypertension - Mother, No family history of Hereditary Spherocytosis, Kidney Disease, Lung Disease, Seizures, Stroke, Thyroid Problems, Tuberculosis. Social History Never smoker, Marital Status - Married, Alcohol Use - Never, Drug Use - No History, Caffeine Use -  Moderate. Medical History Eyes Patient has history of Cataracts Denies history of Glaucoma Ear/Nose/Mouth/Throat Denies history of Chronic sinus problems/congestion, Middle ear problems Hematologic/Lymphatic Patient has history of Anemia Denies history of Hemophilia, Human Immunodeficiency Virus, Lymphedema, Sickle Cell Disease Respiratory Denies history of Aspiration, Asthma, Chronic Obstructive Pulmonary Disease (COPD), Pneumothorax, Sleep Apnea, Tuberculosis Cardiovascular Patient has history of Congestive Heart Failure, Hypertension Denies history of Angina, Arrhythmia, Coronary Artery Disease, Deep Vein Thrombosis, Hypotension, Myocardial Infarction, Peripheral Arterial Disease, Peripheral Venous Disease, Phlebitis, Vasculitis Gastrointestinal Denies history of Cirrhosis , Colitis, Crohnoos, Hepatitis A, Hepatitis B, Hepatitis C Endocrine Denies history of Type I Diabetes, Type II Diabetes Genitourinary Denies history of End Stage Renal Disease Immunological Denies history of Lupus Erythematosus, Raynaudoos, Scleroderma Integumentary (Skin) Denies history of History of Burn Musculoskeletal Patient has history of Osteoarthritis Denies history of Gout, Rheumatoid Arthritis, Osteomyelitis Neurologic Denies history of Dementia, Neuropathy, Quadriplegia, Paraplegia, Seizure Disorder Oncologic Denies history of Received Chemotherapy Psychiatric Denies history of Anorexia/bulimia, Confinement Anxiety Hospitalization/Surgery History - right total knee replacement. Medical A Surgical History Notes nd Gastrointestinal diverticulosis hiatal hernia hyperlipidemia Musculoskeletal DJD Objective Constitutional respirations regular, non-labored and within target range for patient.. Vitals Time Taken: 8:06 AM, Height: 63 in, Temperature: 98.6 F, Pulse: 84 bpm, Respiratory Rate: 20 breaths/min, Blood Pressure: 131/79 mmHg. Cardiovascular 2+ dorsalis pedis/posterior tibialis  pulses. Psychiatric pleasant and cooperative. General Notes: Dehisced wounds to both lower extremities with granulation tissue, nonviable tissue and subcutaneous fat. 2+ pitting edema to the knees. No signs of surrounding soft tissue infection. Integumentary (Hair, Skin) Wound #2 status is Open. Original cause of wound was Trauma. The date acquired was: 06/20/2022. The wound has been in treatment 3 weeks. The wound is located on the Left,Lateral Upper Leg. The wound measures 0.7cm length x 4cm width x 0.5cm depth; 2.199cm^2 area and 1.1cm^3 volume. There is Fat Layer (Subcutaneous Tissue) exposed. There is no tunneling or undermining noted. There is a medium amount of serosanguineous drainage noted. The wound margin is distinct with the outline attached to the wound base. There is large (67-100%) red, pink granulation within the wound bed. There is a small (1-33%) amount of necrotic tissue within the wound bed including Adherent Slough. Wound #3 status is Open. Original cause of wound was Trauma. The date acquired was: 06/20/2022. The wound has been in treatment 3 weeks. The wound is located on the Right,Lateral Lower Leg. The wound measures 3.2cm length x 4.5cm width x 0.9cm depth; 11.31cm^2 area and 10.179cm^3 volume. There is Fat Layer (Subcutaneous Tissue) exposed. There is no undermining noted, however, there is tunneling at 12:00 with a maximum distance of 1.4cm. There is a medium amount of serosanguineous drainage noted. The wound margin is distinct  with the outline attached to the wound base. There is small (1-33%) red, pink granulation within the wound bed. There is a large (67-100%) amount of necrotic tissue within the wound bed including Adherent Slough. Assessment Active Problems ICD-10 Non-pressure chronic ulcer of other part of left lower leg with fat layer exposed Non-pressure chronic ulcer of other part of right lower leg with fat layer exposed Chronic venous hypertension  (idiopathic) with ulcer of bilateral lower extremity Type 2 diabetes mellitus with other skin ulcer Other early complications of trauma, initial encounter Unspecified open wound, right lower leg, initial encounter Unspecified open wound, left lower leg, initial encounter Patient's wounds have shown improvement in size and appearance since last clinic visit. No signs of infection. I debrided nonviable tissue. At this time I recommended Medihoney to the left lower extremity wound and Hydrofera Blue with antibiotic ointment under compression to the right lower extremity. Procedures Wound #2 Pre-procedure diagnosis of Wound #2 is a Trauma, Other located on the Left,Lateral Upper Leg . There was a Excisional Skin/Subcutaneous Tissue Debridement with a total area of 2.8 sq cm performed by Kalman Shan, DO. With the following instrument(s): Curette to remove Viable and Non-Viable tissue/material. Material removed includes Subcutaneous Tissue, Slough, Skin: Dermis, and Skin: Epidermis after achieving pain control using Lidocaine 5% topical ointment. A time out was conducted at 08:32, prior to the start of the procedure. A Minimum amount of bleeding was controlled with Pressure. The procedure was tolerated well with a pain level of 0 throughout and a pain level of 0 following the procedure. Post Debridement Measurements: 0.7cm length x 4cm width x 1.4cm depth; 3.079cm^3 volume. Character of Wound/Ulcer Post Debridement is improved. Post procedure Diagnosis Wound #2: Same as Pre-Procedure Wound #3 Pre-procedure diagnosis of Wound #3 is a Trauma, Other located on the Right,Lateral Lower Leg . There was a Excisional Skin/Subcutaneous Tissue Debridement with a total area of 14.4 sq cm performed by Kalman Shan, DO. With the following instrument(s): Curette to remove Viable and Non-Viable tissue/material. Material removed includes Subcutaneous Tissue, Slough, Skin: Dermis, and Skin: Epidermis after  achieving pain control using Lidocaine 5% topical ointment. A time out was conducted at 08:32, prior to the start of the procedure. A Minimum amount of bleeding was controlled with Pressure. The procedure was tolerated well with a pain level of 0 throughout and a pain level of 0 following the procedure. Post Debridement Measurements: 3.2cm length x 4.5cm width x 0.9cm depth; 10.179cm^3 volume. Character of Wound/Ulcer Post Debridement is improved. Post procedure Diagnosis Wound #3: Same as Pre-Procedure Pre-procedure diagnosis of Wound #3 is a Trauma, Other located on the Right,Lateral Lower Leg . There was a Double Layer Compression Therapy Procedure by Brandy Pilling, RN. Post procedure Diagnosis Wound #3: Same as Pre-Procedure Plan Follow-up Appointments: Return Appointment in 1 week. - Dr. Heber [place] and [place], Room 8 Tuesday Return Appointment in 2 weeks. - Dr. Heber [place] and [place], Room 8 Tuesday Anesthetic: (In clinic) Topical Lidocaine 5% applied to wound bed Bathing/ Shower/ Hygiene: May shower with protection but do not get wound dressing(s) wet. Edema Control - Lymphedema / SCD / Other: Elevate legs to the level of the heart or above for 30 minutes daily and/or when sitting, a frequency of: - 2-3 times a day throughout the day. Avoid standing for long periods of time. Exercise regularly Moisturize legs daily. - lotion both legs every night before bed. Compression stocking or Garment 20-30 mm/Hg pressure to: - apply in the morning and remove at  night. WOUND #2: - Upper Leg Wound Laterality: Left, Lateral Cleanser: dakin's solution 1 x Per Day/30 Days Discharge Instructions: clean with dakins Prim Dressing: MediHoney Gel, tube 1.5 (oz) 1 x Per Day/30 Days ary Discharge Instructions: Apply to wound bed as instructed Secondary Dressing: Woven Gauze Sponges 2x2 in 1 x Per Day/30 Days Discharge Instructions: Apply over primary dressing as directed. Secondary Dressing: Zetuvit Plus  Silicone Border Dressing 7x7(in/in) 1 x Per Day/30 Days Discharge Instructions: Apply silicone border over primary dressing as directed. WOUND #3: - Lower Leg Wound Laterality: Right, Lateral Cleanser: Soap and Water 1 x Per Week/30 Days Discharge Instructions: May shower and wash wound with dial antibacterial soap and water prior to dressing change. Peri-Wound Care: Sween Lotion (Moisturizing lotion) 1 x Per Week/30 Days Discharge Instructions: Apply moisturizing lotion as directed Topical: Gentamicin 1 x Per Week/30 Days Discharge Instructions: As directed by physician Topical: Mupirocin Ointment 1 x Per Week/30 Days Discharge Instructions: Apply Mupirocin (Bactroban) as instructed Prim Dressing: Hydrofera Blue Classic Foam, 4x4 in 1 x Per Week/30 Days ary Discharge Instructions: Moisten with saline prior to applying to wound bed Secondary Dressing: Woven Gauze Sponges 2x2 in 1 x Per Week/30 Days Discharge Instructions: Apply over primary dressing as directed. Secondary Dressing: Zetuvit Plus 4x8 in 1 x Per Week/30 Days Discharge Instructions: Apply over primary dressing as directed. Com pression Wrap: CoFlex TLC XL 2-layer Compression System 4x7 (in/yd) 1 x Per Week/30 Days Discharge Instructions: Apply CoFlex 2-layer compression as directed. (alt for 4 layer) 1. In office sharp debridement 2. [place] lower extremity 3. Hydrofera Blue with gentamicin/mupirocin ointment under 3 layer compressionooright lower extremity 4. Follow-up in 1 week Electronic Signature(s) Signed: 07/14/2022 11:12:17 AM By: Kalman Shan DO Entered By: Kalman Shan on 07/14/2022 09:37:00 -------------------------------------------------------------------------------- HxROS Details Patient Name: Date of Service: [name], Brandy RO [name] 07/14/2022 8:15 A M Medical Record Number: [id number] Patient Account Number: [id number] Date of Birth/Sex: Treating RN: 08/01/1946 (76 y.o. Female) Primary Care  Provider: [name] Other Clinician: Referring Provider: Treating Provider/Extender: Hollie Beach in Treatment: 7 Information Obtained From Patient Eyes Medical History: Positive for: Cataracts Negative for: Glaucoma Ear/Nose/Mouth/Throat Medical History: Negative for: Chronic sinus problems/congestion; Middle ear problems Hematologic/Lymphatic Medical History: Positive for: Anemia Negative for: Hemophilia; Human Immunodeficiency Virus; Lymphedema; Sickle Cell Disease Respiratory Medical History: Negative for: Aspiration; Asthma; Chronic Obstructive Pulmonary Disease (COPD); Pneumothorax; Sleep Apnea; Tuberculosis Cardiovascular Medical History: Positive for: Congestive Heart Failure; Hypertension Negative for: Angina; Arrhythmia; Coronary Artery Disease; Deep Vein Thrombosis; Hypotension; Myocardial Infarction; Peripheral Arterial Disease; Peripheral Venous Disease; Phlebitis; Vasculitis Gastrointestinal Medical History: Negative for: Cirrhosis ; Colitis; Crohns; Hepatitis A; Hepatitis B; Hepatitis C Past Medical History Notes: diverticulosis hiatal hernia hyperlipidemia Endocrine Medical History: Negative for: Type I Diabetes; Type II Diabetes Genitourinary Medical History: Negative for: End Stage Renal Disease Immunological Medical History: Negative for: Lupus Erythematosus; Raynauds; Scleroderma Integumentary (Skin) Medical History: Negative for: History of Burn Musculoskeletal Medical History: Positive for: Osteoarthritis Negative for: Gout; Rheumatoid Arthritis; Osteomyelitis Past Medical History Notes: DJD Neurologic Medical History: Negative for: Dementia; Neuropathy; Quadriplegia; Paraplegia; Seizure Disorder Oncologic Medical History: Negative for: Received Chemotherapy Psychiatric Medical History: Negative for: Anorexia/bulimia; Confinement Anxiety HBO Extended History Items Eyes: Cataracts Immunizations Pneumococcal  Vaccine: Received Pneumococcal Vaccination: Yes Received Pneumococcal Vaccination On or After 60th Birthday: Yes Implantable Devices None Hospitalization / Surgery History Type of Hospitalization/Surgery right total knee replacement Family and Social History Cancer: Yes - Siblings; Diabetes: Yes - Siblings; Heart Disease:  Yes - Mother; Hereditary Spherocytosis: No; Hypertension: Yes - Mother; Kidney Disease: No; Lung Disease: No; Seizures: No; Stroke: No; Thyroid Problems: No; Tuberculosis: No; Never smoker; Marital Status - Married; Alcohol Use: Never; Drug Use: No History; Caffeine Use: Moderate; Financial Concerns: No; Food, Clothing or Shelter Needs: No; Support System Lacking: No; Transportation Concerns: No Electronic Signature(s) Signed: 07/14/2022 11:12:17 AM By: Kalman Shan DO Entered By: Kalman Shan on 07/14/2022 09:34:52 -------------------------------------------------------------------------------- SuperBill Details Patient Name: Date of Service: [name], Brandy RO [name] 07/14/2022 Medical Record Number: [id number] Patient Account Number: [id number] Date of Birth/Sex: Treating RN: 1946/02/11 (76 y.o. 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.812 Non-pressure chronic ulcer of other part of right lower leg with fat layer exposed I87.313 Chronic venous hypertension (idiopathic) with ulcer of bilateral lower extremity E11.622 Type 2 diabetes mellitus with other skin ulcer T79.8XXA Other early complications of trauma, initial encounter S81.801A Unspecified open wound, right lower leg, initial encounter S81.802A Unspecified open wound, left lower leg, initial encounter Facility Procedures CPT4  Code: 45038882 Description: 80034 - DEB SUBQ TISSUE 20 SQ CM/< ICD-10 Diagnosis Description L97.822 Non-pressure chronic ulcer of other part of left lower leg with fat layer expo L97.812 Non-pressure chronic ulcer of other part of right lower leg with fat layer exp  I87.313 Chronic venous hypertension (idiopathic) with ulcer of bilateral lower extremi Modifier: sed osed ty Quantity: 1 Physician Procedures : CPT4 Code Description Modifier [id number] - WC PHYS SUBQ TISS 20 SQ CM ICD-10 Diagnosis Description L97.822 Non-pressure chronic ulcer of other part of left lower leg with fat layer exposed L97.812 Non-pressure chronic ulcer of other part of right  lower leg with fat layer exposed I87.313 Chronic venous hypertension (idiopathic) with ulcer of bilateral lower extremity Quantity: 1 Electronic Signature(s) Signed: 07/14/2022 11:12:17 AM By: Kalman Shan DO Entered By: Kalman Shan on 07/14/2022 09:37:26

## 2022-07-21 ENCOUNTER — Encounter (HOSPITAL_BASED_OUTPATIENT_CLINIC_OR_DEPARTMENT_OTHER): Payer: Medicare HMO | Attending: Internal Medicine | Admitting: Internal Medicine

## 2022-07-21 DIAGNOSIS — I872 Venous insufficiency (chronic) (peripheral): Secondary | ICD-10-CM | POA: Insufficient documentation

## 2022-07-21 DIAGNOSIS — W548XXA Other contact with dog, initial encounter: Secondary | ICD-10-CM | POA: Diagnosis not present

## 2022-07-21 DIAGNOSIS — N183 Chronic kidney disease, stage 3 unspecified: Secondary | ICD-10-CM | POA: Insufficient documentation

## 2022-07-21 DIAGNOSIS — Z79899 Other long term (current) drug therapy: Secondary | ICD-10-CM | POA: Diagnosis not present

## 2022-07-21 DIAGNOSIS — E1122 Type 2 diabetes mellitus with diabetic chronic kidney disease: Secondary | ICD-10-CM | POA: Insufficient documentation

## 2022-07-21 DIAGNOSIS — I87313 Chronic venous hypertension (idiopathic) with ulcer of bilateral lower extremity: Secondary | ICD-10-CM | POA: Diagnosis not present

## 2022-07-21 DIAGNOSIS — E11622 Type 2 diabetes mellitus with other skin ulcer: Secondary | ICD-10-CM | POA: Insufficient documentation

## 2022-07-21 DIAGNOSIS — L97812 Non-pressure chronic ulcer of other part of right lower leg with fat layer exposed: Secondary | ICD-10-CM | POA: Insufficient documentation

## 2022-07-21 DIAGNOSIS — L97822 Non-pressure chronic ulcer of other part of left lower leg with fat layer exposed: Secondary | ICD-10-CM | POA: Insufficient documentation

## 2022-07-23 DIAGNOSIS — I87312 Chronic venous hypertension (idiopathic) with ulcer of left lower extremity: Secondary | ICD-10-CM | POA: Diagnosis not present

## 2022-07-23 NOTE — Progress Notes (Signed)
[name], [name] ([id number]) Visit Report for 07/21/2022 Chief Complaint Document Details Patient Name: Date of Service: [name] 07/21/2022 8:15 A M Medical Record Number: [id number] Patient Account Number: [id number] Date of Birth/Sex: Treating RN: [birth year] (76 y.o. F) Primary Care Provider: [name] Other Clinician: Referring Provider: Treating Provider/Extender: Hollie Beach in Treatment: 8 Information Obtained from: Patient Chief Complaint 05/25/2022; left lower extremity wound following a dog scratch 06/23/2022; lacerations to the right and left lower extremity status post fall Electronic Signature(s) Signed: 07/21/2022 4:41:45 PM By: Kalman Shan DO Entered By: Kalman Shan on 07/21/2022 11:59:10 -------------------------------------------------------------------------------- Debridement Details Patient Name: Date of Service: [name], CA RO [name] 07/21/2022 8:15 A M Medical Record Number: [id number] Patient Account Number: [id number] Date of Birth/Sex: Treating RN: 1946/05/09 (76 y.o. [name], [name] Primary Care Provider: [name] Other Clinician: Referring Provider: Treating Provider/Extender: Hollie Beach in Treatment: 8 Debridement Performed for Assessment: Wound #2 Left,Lateral Upper Leg Performed By: Physician Kalman Shan, DO Debridement Type: Debridement Level of Consciousness (Pre-procedure): Awake and Alert Pre-procedure Verification/Time Out Yes - 09:00 Taken: Start Time: 09:01 Pain Control: Lidocaine 5% topical ointment T Area Debrided (L x W): otal 0.3 (cm) x 2 (cm) = 0.6 (cm) Tissue and other material debrided: Viable, Non-Viable, Slough, Subcutaneous, Skin: Dermis , Slough Level: Skin/Subcutaneous Tissue Debridement Description: Excisional Instrument: Curette Bleeding: Minimum Hemostasis Achieved: Pressure End Time: 09:05 Procedural Pain: 0 Post Procedural Pain: 0 Response  to Treatment: Procedure was tolerated well Level of Consciousness (Post- Awake and Alert procedure): Post Debridement Measurements of Total Wound Length: (cm) 0.3 Width: (cm) 2.3 Depth: (cm) 1 Volume: (cm) 0.542 Character of Wound/Ulcer Post Debridement: Improved Post Procedure Diagnosis Same as Pre-procedure Electronic Signature(s) Signed: 07/21/2022 4:41:45 PM By: Kalman Shan DO Signed: 07/23/2022 5:16:03 PM By: [name] RN, BSN Entered By: [name] on 07/21/2022 09:05:36 -------------------------------------------------------------------------------- Debridement Details Patient Name: Date of Service: [name], CA RO [name] 07/21/2022 8:15 A M Medical Record Number: [id number] Patient Account Number: [id number] Date of Birth/Sex: Treating RN: July 27, 1946 (76 y.o. [name], [name] Primary Care Provider: [name] Other Clinician: Referring Provider: Treating Provider/Extender: Hollie Beach in Treatment: 8 Debridement Performed for Assessment: Wound #3 Right,Lateral Lower Leg Performed By: Physician Kalman Shan, DO Debridement Type: Debridement Level of Consciousness (Pre-procedure): Awake and Alert Pre-procedure Verification/Time Out Yes - 09:00 Taken: Start Time: 09:01 Pain Control: Lidocaine 5% topical ointment T Area Debrided (L x W): otal 3.5 (cm) x 4.5 (cm) = 15.75 (cm) Tissue and other material debrided: Viable, Non-Viable, Slough, Subcutaneous, Skin: Dermis , Slough Level: Skin/Subcutaneous Tissue Debridement Description: Excisional Instrument: Curette Bleeding: Minimum Hemostasis Achieved: Pressure End Time: 09:05 Procedural Pain: 0 Post Procedural Pain: 0 Response to Treatment: Procedure was tolerated well Level of Consciousness (Post- Awake and Alert procedure): Post Debridement Measurements of Total Wound Length: (cm) 3.5 Width: (cm) 4.5 Depth: (cm) 0.7 Volume: (cm) 8.659 Character of Wound/Ulcer Post  Debridement: Improved Post Procedure Diagnosis Same as Pre-procedure Electronic Signature(s) Signed: 07/21/2022 4:41:45 PM By: Kalman Shan DO Signed: 07/23/2022 5:16:03 PM By: [name] RN, BSN Entered By: [name] on 07/21/2022 09:11:32 -------------------------------------------------------------------------------- HPI Details Patient Name: Date of Service: [name], CA RO [name] 07/21/2022 8:15 A M Medical Record Number: [id number] Patient Account Number: [id number] Date of Birth/Sex: Treating RN: Oct 29, 1945 (76 y.o. F) Primary Care Provider: Other Clinician: [name] Referring Provider: Treating Provider/Extender: Hollie Beach in Treatment:  8 History of Present Illness HPI Description: Admission 05/25/2022 [name] is a 76 year old female with a past medical history of diet-controlled type 2 diabetes, chronic 3 stage kidney disease and venous insufficiency that presents to the clinic for a 1 month history of nonhealing ulcer to the left leg. She states that her dog scratched her causing a wound. She has developed cellulitis in this leg and has been treated with clindamycin by her primary care physician. She reports improvement in her symptoms. She currently denies signs of infection. She has been keeping the area covered. She does not wear compression stockings. She is on 80 mg of Lasix twice daily. She has had reflux studies to the left leg on 07/2021 that notes venous reflux throughout the left common femoral vein and greater saphenous vein. She has not had an ablation. She follows with vein and vascular for her venous insufficiency. 8/14; patient presents for follow-up. She states that the wrap stayed on for 4 days and eventually slid down. She has been wearing her compression stocking and doing dressing changes with Hydrofera Blue since. She also states she has a hard time putting on her shoes with the 3 layer compression. She  denies signs of infection. 8/21; patient presents for follow-up. She again had trouble with the wrap sliding down. We have been using Hydrofera Blue with gentamicin under 2 layer Coflex. She currently denies signs of infection. 8/28; patient presents for follow-up. She reports taking the wrap off yesterday. We have been using Hydrofera Blue with gentamicin under 2 layer Coflex. She states that the wrap does slide down but remains over the wound. She is currently wearing her compression stocking. She would like to do this instead of the compression wrap. 9/5; patient presents for follow-up. She has been using Medihoney to the left lower extremity remedy wound with her compression stockings. She reports no drainage from the site. Unfortunately she fell over the weekend and had to go to the ED due to lacerations she experienced on her left and right lower extremity. On the left leg she had 10 sutures placed on the right leg she had 7. She was given Keflex. Today she reports no signs of infection. She has been using Xeroform to the suture sites. 9/11; patient presents for follow-up. She has been using bacitracin ointment to the suture line. She currently denies signs of infection. 9/19; patient presents for follow-up. She has been using Medihoney to the wound beds. She denies signs of infection. 9/26; patient presents for follow-up. She has been using Dakin's wet-to-dry dressings to the wound beds. She reports improvement in wound healing. She has no issues or complaints today. 10/3; patient presents for follow-up. We had used compression therapy along with Hydrofera Blue to the right lower extremity. She states she felt uncomfortable with the wrap and took it off 3 days ago. She has been using Medihoney without issues to the left lower extremity wound. She denies signs of infection. Electronic Signature(s) Signed: 07/21/2022 4:41:45 PM By: Kalman Shan DO Entered By: Kalman Shan on  07/21/2022 11:59:46 -------------------------------------------------------------------------------- Physical Exam Details Patient Name: Date of Service: [name] RO [name] 07/21/2022 8:15 A M Medical Record Number: [id number] Patient Account Number: [id number] Date of Birth/Sex: Treating RN: 1946-05-10 (76 y.o. F) Primary Care Provider: [name] Other Clinician: Referring Provider: Treating Provider/Extender: Hollie Beach in Treatment: 8 Constitutional respirations regular, non-labored and within target range for patient.. Cardiovascular 2+ dorsalis pedis/posterior tibialis pulses. Psychiatric pleasant and  cooperative. Notes Dehisced wounds to both lower extremities with granulation tissue, nonviable tissue and subcutaneous fat. 2+ pitting edema to the knees. No signs of surrounding soft tissue infection. Electronic Signature(s) Signed: 07/21/2022 4:41:45 PM By: Kalman Shan DO Entered By: Kalman Shan on 07/21/2022 12:00:15 -------------------------------------------------------------------------------- Physician Orders Details Patient Name: Date of Service: [name], CA RO [name] 07/21/2022 8:15 A M Medical Record Number: [id number] Patient Account Number: [id number] Date of Birth/Sex: Treating RN: 15-May-1946 (76 y.o. [name], [name] Primary Care Provider: [name] Other Clinician: Referring Provider: Treating Provider/Extender: Hollie Beach in Treatment: 8 Verbal / Phone Orders: No Diagnosis Coding ICD-10 Coding Code Description [phone] Non-pressure chronic ulcer of other part of left lower leg with fat layer exposed L97.812 Non-pressure chronic ulcer of other part of right lower leg with fat layer exposed I87.313 Chronic venous hypertension (idiopathic) with ulcer of bilateral lower extremity E11.622 Type 2 diabetes mellitus with other skin ulcer T79.8XXA Other early complications of trauma, initial  encounter S81.801A Unspecified open wound, right lower leg, initial encounter S81.802A Unspecified open wound, left lower leg, initial encounter Follow-up Appointments ppointment in 1 week. - Dr. Heber [place] and [place], Room 8 Tuesday Return A ppointment in 2 weeks. - Dr. Heber [place] and [place], Room 8 Tuesday Return A Anesthetic (In clinic) Topical Lidocaine 5% applied to wound bed Bathing/ Shower/ Hygiene May shower with protection but do not get wound dressing(s) wet. Edema Control - Lymphedema / SCD / Other Elevate legs to the level of the heart or above for 30 minutes daily and/or when sitting, a frequency of: - 2-3 times a day throughout the day. Avoid standing for long periods of time. Exercise regularly Moisturize legs daily. - lotion both legs every night before bed. Compression stocking or Garment 20-30 mm/Hg pressure to: - apply in the morning and remove at night. Wound Treatment Wound #2 - Upper Leg Wound Laterality: Left, Lateral Cleanser: dakin's solution Every Other Day/30 Days Discharge Instructions: clean with dakins Prim Dressing: Hydrofera Blue Ready Foam, 2.5 x2.5 in Every Other Day/30 Days ary Discharge Instructions: Apply to wound bed as instructed Prim Dressing: MediHoney Gel, tube 1.5 (oz) Every Other Day/30 Days ary Discharge Instructions: Apply to wound bed as instructed Secondary Dressing: Woven Gauze Sponges 2x2 in Every Other Day/30 Days Discharge Instructions: Apply over primary dressing as directed. Secondary Dressing: Zetuvit Plus Silicone Border Dressing 7x7(in/in) Every Other Day/30 Days Discharge Instructions: Apply silicone border over primary dressing as directed. Wound #3 - Lower Leg Wound Laterality: Right, Lateral Cleanser: Soap and Water 1 x Per Week/30 Days Discharge Instructions: May shower and wash wound with dial antibacterial soap and water prior to dressing change. Peri-Wound Care: Sween Lotion (Moisturizing lotion) 1 x Per Week/30  Days Discharge Instructions: Apply moisturizing lotion as directed Prim Dressing: Hydrofera Blue Classic Foam, 4x4 in 1 x Per Week/30 Days ary Discharge Instructions: Moisten with saline prior to applying to wound bed Prim Dressing: Santyl Ointment 1 x Per Week/30 Days ary Discharge Instructions: Apply nickel thick amount to wound bed as instructed Secondary Dressing: Woven Gauze Sponges 2x2 in 1 x Per Week/30 Days Discharge Instructions: Apply over primary dressing as directed. Secondary Dressing: Zetuvit Plus 4x8 in 1 x Per Week/30 Days Discharge Instructions: Apply over primary dressing as directed. Compression Wrap: Kerlix Roll 4.5x3.1 (in/yd) 1 x Per Week/30 Days Discharge Instructions: Apply Kerlix and Coban compression as directed. Compression Wrap: Coban Self-Adherent Wrap 4x5 (in/yd) 1 x Per Week/30 Days Discharge Instructions: Apply over Kerlix  as directed. Electronic Signature(s) Signed: 07/21/2022 4:41:45 PM By: Kalman Shan DO Entered By: Kalman Shan on 07/21/2022 12:00:28 -------------------------------------------------------------------------------- Problem List Details Patient Name: Date of Service: [name], CA RO [name] 07/21/2022 8:15 A M Medical Record Number: [id number] Patient Account Number: [id number] Date of Birth/Sex: Treating RN: Aug 01, 1946 (76 y.o. [name], [name] Primary Care Provider: [name] Other Clinician: Referring Provider: Treating Provider/Extender: Hollie Beach in Treatment: 8 Active Problems ICD-10 Encounter Code Description Active Date MDM Diagnosis [phone] Non-pressure chronic ulcer of other part of left lower leg with fat layer exposed8/04/2022 No Yes L97.812 Non-pressure chronic ulcer of other part of right lower leg with fat layer 07/14/2022 No Yes exposed I87.313 Chronic venous hypertension (idiopathic) with ulcer of bilateral lower extremity 07/14/2022 No Yes E11.622 Type 2 diabetes mellitus with  other skin ulcer 05/25/2022 No Yes T79.8XXA Other early complications of trauma, initial encounter 06/23/2022 No Yes S81.801A Unspecified open wound, right lower leg, initial encounter 06/23/2022 No Yes S81.802A Unspecified open wound, left lower leg, initial encounter 06/23/2022 No Yes Inactive Problems Resolved Problems ICD-10 Code Description Active Date Resolved Date W54.8XXA Other contact with dog, initial encounter 05/25/2022 05/25/2022 Electronic Signature(s) Signed: 07/21/2022 4:41:45 PM By: Kalman Shan DO Entered By: Kalman Shan on 07/21/2022 11:58:57 -------------------------------------------------------------------------------- Progress Note Details Patient Name: Date of Service: [name], CA RO [name] 07/21/2022 8:15 A M Medical Record Number: [id number] Patient Account Number: [id number] Date of Birth/Sex: Treating RN: Feb 14, 1946 (76 y.o. F) Primary Care Provider: [name] Other Clinician: Referring Provider: Treating Provider/Extender: Hollie Beach in Treatment: 8 Subjective Chief Complaint Information obtained from Patient 05/25/2022; left lower extremity wound following a dog scratch 06/23/2022; lacerations to the right and left lower extremity status post fall History of Present Illness (HPI) Admission 05/25/2022 Ms. Sophy Mesler is a 76 year old female with a past medical history of diet-controlled type 2 diabetes, chronic 3 stage kidney disease and venous insufficiency that presents to the clinic for a 1 month history of nonhealing ulcer to the left leg. She states that her dog scratched her causing a wound. She has developed cellulitis in this leg and has been treated with clindamycin by her primary care physician. She reports improvement in her symptoms. She currently denies signs of infection. She has been keeping the area covered. She does not wear compression stockings. She is on 80 mg of Lasix twice daily. She has had reflux studies to the  left leg on 07/2021 that notes venous reflux throughout the left common femoral vein and greater saphenous vein. She has not had an ablation. She follows with vein and vascular for her venous insufficiency. 8/14; patient presents for follow-up. She states that the wrap stayed on for 4 days and eventually slid down. She has been wearing her compression stocking and doing dressing changes with Hydrofera Blue since. She also states she has a hard time putting on her shoes with the 3 layer compression. She denies signs of infection. 8/21; patient presents for follow-up. She again had trouble with the wrap sliding down. We have been using Hydrofera Blue with gentamicin under 2 layer Coflex. She currently denies signs of infection. 8/28; patient presents for follow-up. She reports taking the wrap off yesterday. We have been using Hydrofera Blue with gentamicin under 2 layer Coflex. She states that the wrap does slide down but remains over the wound. She is currently wearing her compression stocking. She would like to do this instead of the compression wrap. 9/5;  patient presents for follow-up. She has been using Medihoney to the left lower extremity remedy wound with her compression stockings. She reports no drainage from the site. Unfortunately she fell over the weekend and had to go to the ED due to lacerations she experienced on her left and right lower extremity. On the left leg she had 10 sutures placed on the right leg she had 7. She was given Keflex. Today she reports no signs of infection. She has been using Xeroform to the suture sites. 9/11; patient presents for follow-up. She has been using bacitracin ointment to the suture line. She currently denies signs of infection. 9/19; patient presents for follow-up. She has been using Medihoney to the wound beds. She denies signs of infection. 9/26; patient presents for follow-up. She has been using Dakin's wet-to-dry dressings to the wound beds. She  reports improvement in wound healing. She has no issues or complaints today. 10/3; patient presents for follow-up. We had used compression therapy along with Hydrofera Blue to the right lower extremity. She states she felt uncomfortable with the wrap and took it off 3 days ago. She has been using Medihoney without issues to the left lower extremity wound. She denies signs of infection. Patient History Information obtained from Patient. Family History Cancer - Siblings, Diabetes - Siblings, Heart Disease - Mother, Hypertension - Mother, No family history of Hereditary Spherocytosis, Kidney Disease, Lung Disease, Seizures, Stroke, Thyroid Problems, Tuberculosis. Social History Never smoker, Marital Status - Married, Alcohol Use - Never, Drug Use - No History, Caffeine Use - Moderate. Medical History Eyes Patient has history of Cataracts Denies history of Glaucoma Ear/Nose/Mouth/Throat Denies history of Chronic sinus problems/congestion, Middle ear problems Hematologic/Lymphatic Patient has history of Anemia Denies history of Hemophilia, Human Immunodeficiency Virus, Lymphedema, Sickle Cell Disease Respiratory Denies history of Aspiration, Asthma, Chronic Obstructive Pulmonary Disease (COPD), Pneumothorax, Sleep Apnea, Tuberculosis Cardiovascular Patient has history of Congestive Heart Failure, Hypertension Denies history of Angina, Arrhythmia, Coronary Artery Disease, Deep Vein Thrombosis, Hypotension, Myocardial Infarction, Peripheral Arterial Disease, Peripheral Venous Disease, Phlebitis, Vasculitis Gastrointestinal Denies history of Cirrhosis , Colitis, Crohnoos, Hepatitis A, Hepatitis B, Hepatitis C Endocrine Denies history of Type I Diabetes, Type II Diabetes Genitourinary Denies history of End Stage Renal Disease Immunological Denies history of Lupus Erythematosus, Raynaudoos, Scleroderma Integumentary (Skin) Denies history of History of Burn Musculoskeletal Patient has  history of Osteoarthritis Denies history of Gout, Rheumatoid Arthritis, Osteomyelitis Neurologic Denies history of Dementia, Neuropathy, Quadriplegia, Paraplegia, Seizure Disorder Oncologic Denies history of Received Chemotherapy Psychiatric Denies history of Anorexia/bulimia, Confinement Anxiety Hospitalization/Surgery History - right total knee replacement. Medical A Surgical History Notes nd Gastrointestinal diverticulosis hiatal hernia hyperlipidemia Musculoskeletal DJD Objective Constitutional respirations regular, non-labored and within target range for patient.. Vitals Time Taken: 8:20 AM, Height: 63 in, Temperature: 98.2 F, Pulse: 72 bpm, Respiratory Rate: 18 breaths/min, Blood Pressure: 132/82 mmHg. Cardiovascular 2+ dorsalis pedis/posterior tibialis pulses. Psychiatric pleasant and cooperative. General Notes: Dehisced wounds to both lower extremities with granulation tissue, nonviable tissue and subcutaneous fat. 2+ pitting edema to the knees. No signs of surrounding soft tissue infection. Integumentary (Hair, Skin) Wound #2 status is Open. Original cause of wound was Trauma. The date acquired was: 06/20/2022. The wound has been in treatment 4 weeks. The wound is located on the Left,Lateral Upper Leg. The wound measures 0.3cm length x 2.3cm width x 1cm depth; 0.542cm^2 area and 0.542cm^3 volume. There is Fat Layer (Subcutaneous Tissue) exposed. There is no tunneling or undermining noted. There is a medium amount  of serosanguineous drainage noted. The wound margin is distinct with the outline attached to the wound base. There is medium (34-66%) red, pink granulation within the wound bed. There is a medium (34-66%) amount of necrotic tissue within the wound bed including Adherent Slough. The periwound skin appearance exhibited: Hemosiderin Staining. The periwound skin appearance did not exhibit: Callus, Crepitus, Excoriation, Induration, Rash, Scarring, Dry/Scaly, Maceration,  Atrophie Blanche, Cyanosis, Ecchymosis, Mottled, Pallor, Rubor, Erythema. Periwound temperature was noted as No Abnormality. Wound #3 status is Open. Original cause of wound was Trauma. The date acquired was: 06/20/2022. The wound has been in treatment 4 weeks. The wound is located on the Right,Lateral Lower Leg. The wound measures 3.5cm length x 4.5cm width x 0.7cm depth; 12.37cm^2 area and 8.659cm^3 volume. There is Fat Layer (Subcutaneous Tissue) exposed. There is no tunneling noted, however, there is undermining starting at 11:00 and ending at 12:00 with a maximum distance of 1cm. There is a medium amount of serosanguineous drainage noted. The wound margin is distinct with the outline attached to the wound base. There is small (1-33%) red, pink granulation within the wound bed. There is a large (67-100%) amount of necrotic tissue within the wound bed including Adherent Slough. The periwound skin appearance had no abnormalities noted for texture. The periwound skin appearance had no abnormalities noted for moisture. The periwound skin appearance exhibited: Hemosiderin Staining. The periwound skin appearance did not exhibit: Atrophie Blanche, Cyanosis, Ecchymosis, Mottled, Pallor, Rubor, Erythema. Periwound temperature was noted as No Abnormality. Assessment Active Problems ICD-10 Non-pressure chronic ulcer of other part of left lower leg with fat layer exposed Non-pressure chronic ulcer of other part of right lower leg with fat layer exposed Chronic venous hypertension (idiopathic) with ulcer of bilateral lower extremity Type 2 diabetes mellitus with other skin ulcer Other early complications of trauma, initial encounter Unspecified open wound, right lower leg, initial encounter Unspecified open wound, left lower leg, initial encounter Patient's wounds appear well-healing. No signs of infection. I debrided nonviable tissue. Patient does have areas of increased depth on the left lower extremity  wound beds. I recommended packing this with Hydrofera Blue but also continuing Medihoney. T the right lower extremity we have been using a 2 o layer Coflex and will go down to Kerlix/Coban to see if this is more comfortable for the patient. I recommended continuing Hydrofera Blue and adding Santyl. Procedures Wound #2 Pre-procedure diagnosis of Wound #2 is an Abrasion located on the Left,Lateral Upper Leg . There was a Excisional Skin/Subcutaneous Tissue Debridement with a total area of 0.6 sq cm performed by Kalman Shan, DO. With the following instrument(s): Curette to remove Viable and Non-Viable tissue/material. Material removed includes Subcutaneous Tissue, Slough, and Skin: Dermis after achieving pain control using Lidocaine 5% topical ointment. A time out was conducted at 09:00, prior to the start of the procedure. A Minimum amount of bleeding was controlled with Pressure. The procedure was tolerated well with a pain level of 0 throughout and a pain level of 0 following the procedure. Post Debridement Measurements: 0.3cm length x 2.3cm width x 1cm depth; 0.542cm^3 volume. Character of Wound/Ulcer Post Debridement is improved. Post procedure Diagnosis Wound #2: Same as Pre-Procedure Wound #3 Pre-procedure diagnosis of Wound #3 is a Trauma, Other located on the Right,Lateral Lower Leg . There was a Excisional Skin/Subcutaneous Tissue Debridement with a total area of 15.75 sq cm performed by Kalman Shan, DO. With the following instrument(s): Curette to remove Viable and Non-Viable tissue/material. Material removed includes Subcutaneous Tissue,  Slough, and Skin: Dermis after achieving pain control using Lidocaine 5% topical ointment. A time out was conducted at 09:00, prior to the start of the procedure. A Minimum amount of bleeding was controlled with Pressure. The procedure was tolerated well with a pain level of 0 throughout and a pain level of 0 following the procedure. Post  Debridement Measurements: 3.5cm length x 4.5cm width x 0.7cm depth; 8.659cm^3 volume. Character of Wound/Ulcer Post Debridement is improved. Post procedure Diagnosis Wound #3: Same as Pre-Procedure Plan Follow-up Appointments: Return Appointment in 1 week. - Dr. Heber [place] and [place], Room 8 Tuesday Return Appointment in 2 weeks. - Dr. Heber [place] and [place], Room 8 Tuesday Anesthetic: (In clinic) Topical Lidocaine 5% applied to wound bed Bathing/ Shower/ Hygiene: May shower with protection but do not get wound dressing(s) wet. Edema Control - Lymphedema / SCD / Other: Elevate legs to the level of the heart or above for 30 minutes daily and/or when sitting, a frequency of: - 2-3 times a day throughout the day. Avoid standing for long periods of time. Exercise regularly Moisturize legs daily. - lotion both legs every night before bed. Compression stocking or Garment 20-30 mm/Hg pressure to: - apply in the morning and remove at night. WOUND #2: - Upper Leg Wound Laterality: Left, Lateral Cleanser: dakin's solution Every Other Day/30 Days Discharge Instructions: clean with dakins Prim Dressing: Hydrofera Blue Ready Foam, 2.5 x2.5 in Every Other Day/30 Days ary Discharge Instructions: Apply to wound bed as instructed Prim Dressing: MediHoney Gel, tube 1.5 (oz) Every Other Day/30 Days ary Discharge Instructions: Apply to wound bed as instructed Secondary Dressing: Woven Gauze Sponges 2x2 in Every Other Day/30 Days Discharge Instructions: Apply over primary dressing as directed. Secondary Dressing: Zetuvit Plus Silicone Border Dressing 7x7(in/in) Every Other Day/30 Days Discharge Instructions: Apply silicone border over primary dressing as directed. WOUND #3: - Lower Leg Wound Laterality: Right, Lateral Cleanser: Soap and Water 1 x Per Week/30 Days Discharge Instructions: May shower and wash wound with dial antibacterial soap and water prior to dressing change. Peri-Wound Care: Sween Lotion  (Moisturizing lotion) 1 x Per Week/30 Days Discharge Instructions: Apply moisturizing lotion as directed Prim Dressing: Hydrofera Blue Classic Foam, 4x4 in 1 x Per Week/30 Days ary Discharge Instructions: Moisten with saline prior to applying to wound bed Prim Dressing: Santyl Ointment 1 x Per Week/30 Days ary Discharge Instructions: Apply nickel thick amount to wound bed as instructed Secondary Dressing: Woven Gauze Sponges 2x2 in 1 x Per Week/30 Days Discharge Instructions: Apply over primary dressing as directed. Secondary Dressing: Zetuvit Plus 4x8 in 1 x Per Week/30 Days Discharge Instructions: Apply over primary dressing as directed. Compression Wrap: Kerlix Roll 4.5x3.1 (in/yd) 1 x Per Week/30 Days Discharge Instructions: Apply Kerlix and Coban compression as directed. Compression Wrap: Coban Self-Adherent Wrap 4x5 (in/yd) 1 x Per Week/30 Days Discharge Instructions: Apply over Kerlix as directed. 1. In office sharp debridement 2. Hydrofera Blue 3. Medihoney 4. Santyl 5. Kerlix/Coban 6. Follow-up in 1 week Electronic Signature(s) Signed: 07/21/2022 4:41:45 PM By: Kalman Shan DO Entered By: Kalman Shan on 07/21/2022 12:02:28 -------------------------------------------------------------------------------- HxROS Details Patient Name: Date of Service: [name], [place] RO [name] 07/21/2022 8:15 A M Medical Record Number: [id number] Patient Account Number: [id number] Date of Birth/Sex: Treating RN: 05-22-1946 (76 y.o. F) Primary Care Provider: [name] Other Clinician: Referring Provider: Treating Provider/Extender: Hollie Beach in Treatment: 8 Information Obtained From Patient Eyes Medical History: Positive for: Cataracts Negative for: Glaucoma Ear/Nose/Mouth/Throat Medical History:  Negative for: Chronic sinus problems/congestion; Middle ear problems Hematologic/Lymphatic Medical History: Positive for: Anemia Negative for: Hemophilia;  Human Immunodeficiency Virus; Lymphedema; Sickle Cell Disease Respiratory Medical History: Negative for: Aspiration; Asthma; Chronic Obstructive Pulmonary Disease (COPD); Pneumothorax; Sleep Apnea; Tuberculosis Cardiovascular Medical History: Positive for: Congestive Heart Failure; Hypertension Negative for: Angina; Arrhythmia; Coronary Artery Disease; Deep Vein Thrombosis; Hypotension; Myocardial Infarction; Peripheral Arterial Disease; Peripheral Venous Disease; Phlebitis; Vasculitis Gastrointestinal Medical History: Negative for: Cirrhosis ; Colitis; Crohns; Hepatitis A; Hepatitis B; Hepatitis C Past Medical History Notes: diverticulosis hiatal hernia hyperlipidemia Endocrine Medical History: Negative for: Type I Diabetes; Type II Diabetes Genitourinary Medical History: Negative for: End Stage Renal Disease Immunological Medical History: Negative for: Lupus Erythematosus; Raynauds; Scleroderma Integumentary (Skin) Medical History: Negative for: History of Burn Musculoskeletal Medical History: Positive for: Osteoarthritis Negative for: Gout; Rheumatoid Arthritis; Osteomyelitis Past Medical History Notes: DJD Neurologic Medical History: Negative for: Dementia; Neuropathy; Quadriplegia; Paraplegia; Seizure Disorder Oncologic Medical History: Negative for: Received Chemotherapy Psychiatric Medical History: Negative for: Anorexia/bulimia; Confinement Anxiety HBO Extended History Items Eyes: Cataracts Immunizations Pneumococcal Vaccine: Received Pneumococcal Vaccination: Yes Received Pneumococcal Vaccination On or After 60th Birthday: Yes Implantable Devices None Hospitalization / Surgery History Type of Hospitalization/Surgery right total knee replacement Family and Social History Cancer: Yes - Siblings; Diabetes: Yes - Siblings; Heart Disease: Yes - Mother; Hereditary Spherocytosis: No; Hypertension: Yes - Mother; Kidney Disease: No; Lung Disease: No; Seizures:  No; Stroke: No; Thyroid Problems: No; Tuberculosis: No; Never smoker; Marital Status - Married; Alcohol Use: Never; Drug Use: No History; Caffeine Use: Moderate; Financial Concerns: No; Food, Clothing or Shelter Needs: No; Support System Lacking: No; Transportation Concerns: No Electronic Signature(s) Signed: 07/21/2022 4:41:45 PM By: Kalman Shan DO Entered By: Kalman Shan on 07/21/2022 11:59:53 -------------------------------------------------------------------------------- SuperBill Details Patient Name: Date of Service: [name], [place] RO [name] 07/21/2022 Medical Record Number: [id number] Patient Account Number: [id number] Date of Birth/Sex: Treating RN: 23-May-1946 (76 y.o. [name], [name] Primary Care Provider: [name] Other Clinician: Referring Provider: Treating Provider/Extender: Hollie Beach in Treatment: 8 Diagnosis Coding ICD-10 Codes Code Description [id number] Non-pressure chronic ulcer of other part of left lower leg with fat layer exposed L97.812 Non-pressure chronic ulcer of other part of right lower leg with fat layer exposed I87.313 Chronic venous hypertension (idiopathic) with ulcer of bilateral lower extremity E11.622 Type 2 diabetes mellitus with other skin ulcer T79.8XXA Other early complications of trauma, initial encounter S81.801A Unspecified open wound, right lower leg, initial encounter S81.802A Unspecified open wound, left lower leg, initial encounter Facility Procedures CPT4 Code: [id number] Description: 42595 - DEB SUBQ TISSUE 20 SQ CM/< ICD-10 Diagnosis Description L97.822 Non-pressure chronic ulcer of other part of left lower leg with fat layer expo L97.812 Non-pressure chronic ulcer of other part of right lower leg with fat layer exp Modifier: sed osed Quantity: 1 Physician Procedures : CPT4 Code Description Modifier [id number] 11042 - WC PHYS SUBQ TISS 20 SQ CM ICD-10 Diagnosis Description L97.822 Non-pressure chronic ulcer  of other part of left lower leg with fat layer exposed L97.812 Non-pressure chronic ulcer of other part of right  lower leg with fat layer exposed Quantity: 1 Electronic Signature(s) Signed: 07/21/2022 4:41:45 PM By: Kalman Shan DO Entered By: Kalman Shan on 07/21/2022 12:02:50

## 2022-07-23 NOTE — Progress Notes (Signed)
LILLAR, BIANCA ([id number]) Visit Report for 07/21/2022 Arrival Information Details Patient Name: Date of Service: [name] 07/21/2022 8:15 A M Medical Record Number: [id number] Patient Account Number: [id number] Date of Birth/Sex: Treating RN: 18-Dec-1945 (76 y.o. [name], [name] Primary Care Yvonne Petite: [name] Other Clinician: Referring Ciela Mahajan: Treating Benecio Kluger/Extender: Hollie Beach in Treatment: 8 Visit Information History Since Last Visit Added or deleted any medications: No Patient Arrived: [name] Any new allergies or adverse reactions: No Arrival Time: 08:15 Had a fall or experienced change in No Accompanied By: son activities of daily living that may affect Transfer Assistance: None risk of falls: Patient Identification Verified: Yes Signs or symptoms of abuse/neglect since last visito No Secondary Verification Process Completed: Yes Hospitalized since last visit: No Patient Requires Transmission-Based Precautions: No Implantable device outside of the clinic excluding No Patient Has Alerts: No cellular tissue based products placed in the center since last visit: Has Dressing in Place as Prescribed: Yes Has Compression in Place as Prescribed: No Pain Present Now: No Notes per patient had itching on saturday remove compression wrap and has been dressing wound with hydrofera blue, bordered, foam and compression stockings. Electronic Signature(s) Signed: 07/23/2022 5:16:03 PM By: [name] RN, BSN Entered By: [name] on 07/21/2022 08:24:40 -------------------------------------------------------------------------------- Encounter Discharge Information Details Patient Name: Date of Service: [name], [place] RO [name] 07/21/2022 8:15 A M Medical Record Number: [id number] Patient Account Number: [id number] Date of Birth/Sex: Treating RN: [birth year] (76 y.o. [name], [name] Primary Care Niza Soderholm: [name] Other  Clinician: Referring Benjamim Harnish: Treating Charis Juliana/Extender: Hollie Beach in Treatment: 8 Encounter Discharge Information Items Post Procedure Vitals Discharge Condition: Stable Temperature (F): 98.2 Ambulatory Status: Walker Pulse (bpm): 72 Discharge Destination: Home Respiratory Rate (breaths/min): 18 Transportation: Private Auto Blood Pressure (mmHg): 132/82 Accompanied By: son Schedule Follow-up Appointment: Yes Clinical Summary of Care: Electronic Signature(s) Signed: 07/23/2022 5:16:03 PM By: [name] RN, BSN Entered By: [name] on 07/21/2022 09:08:01 -------------------------------------------------------------------------------- Lower Extremity Assessment Details Patient Name: Date of Service: [name] RO [name] 07/21/2022 8:15 A M Medical Record Number: [id number] Patient Account Number: [id number] Date of Birth/Sex: Treating RN: [birth year] (76 y.o. [name] Primary Care Chanse Kagel: [name] Other Clinician: Referring Shontia Gillooly: Treating Annamaria Salah/Extender: Hollie Beach in Treatment: 8 Edema Assessment Assessed: [Left: No] [Right: Yes] Edema: [Left: Yes] [Right: Yes] Calf Left: Right: Point of Measurement: 41 cm From Medial Instep 43 cm Ankle Left: Right: Point of Measurement: 8 cm From Medial Instep 25 cm Vascular Assessment Pulses: Dorsalis Pedis Palpable: [Right:Yes] Electronic Signature(s) Signed: 07/23/2022 5:16:03 PM By: [name] RN, BSN Entered By: [name] on 07/21/2022 08:25:17 -------------------------------------------------------------------------------- Multi Wound Chart Details Patient Name: Date of Service: [name], [place] RO [name] 07/21/2022 8:15 A M Medical Record Number: [id number] Patient Account Number: [id number] Date of Birth/Sex: Treating RN: 05/07/1946 (76 y.o. F) Primary Care Shenice Dolder: [name] Other Clinician: Referring Tishara Pizano: Treating  Tomisha Reppucci/Extender: Hollie Beach in Treatment: 8 Vital Signs Height(in): 38 Pulse(bpm): 102 Weight(lbs): Blood Pressure(mmHg): 132/82 Body Mass Index(BMI): Temperature(F): 98.2 Respiratory Rate(breaths/min): 18 Photos: [2:Left, Lateral Upper Leg] [3:Right, Lateral Lower Leg] [N/A:N/A N/A] Wound Location: [2:Trauma] [3:Trauma] [N/A:N/A] Wounding Event: [2:Abrasion] [3:Trauma, Other] [N/A:N/A] Primary Etiology: [2:Lymphedema] [3:N/A] [N/A:N/A] Secondary Etiology: [2:Cataracts, Anemia, Congestive Heart] [N/A:N/A] Comorbid History: [2:Failure, Hypertension, Osteoarthritis 06/20/2022] [3:06/20/2022] [N/A:N/A] Date Acquired: [2:4] [3:4] [N/A:N/A] Weeks of Treatment: [2:Open] [3:Open] [N/A:N/A] Wound Status: [2:No] [3:No] [N/A:N/A] Wound Recurrence: [  2:0.3x2.3x1] [3:3.5x4.5x0.7] [N/A:N/A] Measurements L x W x D (cm) [2:0.542] [3:12.37] [N/A:N/A] A (cm) : rea [2:0.542] [3:8.659] [N/A:N/A] Volume (cm) : [2:95.80%] [3:68.50%] [N/A:N/A] % Reduction in A [2:rea: 79.10%] [3:-10.20%] [N/A:N/A] % Reduction in Volume: [3:11] Starting Position 1 (o'clock): [3:12] Ending Position 1 (o'clock): [3:1] Maximum Distance 1 (cm): [2:No] [3:Yes] [N/A:N/A] Undermining: [2:Full Thickness With Exposed Support Full Thickness With Exposed Support N/A] Classification: [2:Structures Medium] [3:Structures Medium] [N/A:N/A] Exudate A mount: [2:Serosanguineous] [3:Serosanguineous] [N/A:N/A] Exudate Type: [2:red, brown] [3:red, brown] [N/A:N/A] Exudate Color: [2:Distinct, outline attached] [3:Distinct, outline attached] [N/A:N/A] Wound Margin: [2:Medium (34-66%)] [3:Small (1-33%)] [N/A:N/A] Granulation A mount: [2:Red, Pink] [3:Red, Pink] [N/A:N/A] Granulation Quality: [2:Medium (34-66%)] [3:Large (67-100%)] [N/A:N/A] Necrotic A mount: [2:Fat Layer (Subcutaneous Tissue): Yes Fat Layer (Subcutaneous Tissue): Yes N/A] Exposed Structures: [2:Fascia: No Tendon: No Muscle: No Joint: No Bone: No  Large (67-100%)] [3:Fascia: No Tendon: No Muscle: No Joint: No Bone: No Small (1-33%)] [N/A:N/A] Epithelialization: [2:Debridement - Excisional] [3:Debridement - Excisional] [N/A:N/A] Debridement: Pre-procedure Verification/Time Out 09:00 [3:09:00] [N/A:N/A] Taken: [2:Lidocaine 5% topical ointment] [3:Lidocaine 5% topical ointment] [N/A:N/A] Pain Control: [2:Subcutaneous, Slough] [3:Subcutaneous, Slough] [N/A:N/A] Tissue Debrided: [2:Skin/Subcutaneous Tissue] [3:Skin/Subcutaneous Tissue] [N/A:N/A] Level: [2:0.6] [3:15.75] [N/A:N/A] Debridement A (sq cm): [2:rea Curette] [3:Curette] [N/A:N/A] Instrument: [2:Minimum] [3:Minimum] [N/A:N/A] Bleeding: [2:Pressure] [3:Pressure] [N/A:N/A] Hemostasis A chieved: [2:0] [3:0] [N/A:N/A] Procedural Pain: [2:0] [3:0] [N/A:N/A] Post Procedural Pain: [2:Procedure was tolerated well] [3:Procedure was tolerated well] [N/A:N/A] Debridement Treatment Response: [2:0.3x2.3x1] [3:3.5x4.5x0.7] [N/A:N/A] Post Debridement Measurements L x W x D (cm) [2:0.542] [3:8.659] [N/A:N/A] Post Debridement Volume: (cm) [2:Excoriation: No] [3:Excoriation: No] [N/A:N/A] Periwound Skin Texture: [2:Induration: No Callus: No Crepitus: No Rash: No Scarring: No Maceration: No] [3:Induration: No Callus: No Crepitus: No Rash: No Scarring: No Maceration: No] [N/A:N/A] Periwound Skin Moisture: [2:Dry/Scaly: No Hemosiderin Staining: Yes] [3:Dry/Scaly: No Hemosiderin Staining: Yes] [N/A:N/A] Periwound Skin Color: [2:Atrophie Blanche: No Cyanosis: No Ecchymosis: No Erythema: No Mottled: No Pallor: No Rubor: No No Abnormality] [3:Atrophie Blanche: No Cyanosis: No Ecchymosis: No Erythema: No Mottled: No Pallor: No Rubor: No No Abnormality] [N/A:N/A] Temperature: [2:Debridement] [3:Debridement] [N/A:N/A] Treatment Notes Wound #2 (Upper Leg) Wound Laterality: Left, Lateral Cleanser dakin's solution Discharge Instruction: clean with dakins Peri-Wound Care Topical Primary  Dressing Hydrofera Blue Ready Foam, 2.5 x2.5 in Discharge Instruction: Apply to wound bed as instructed MediHoney Gel, tube 1.5 (oz) Discharge Instruction: Apply to wound bed as instructed Secondary Dressing Woven Gauze Sponges 2x2 in Discharge Instruction: Apply over primary dressing as directed. Zetuvit Plus Silicone Border Dressing 7x7(in/in) Discharge Instruction: Apply silicone border over primary dressing as directed. Secured With Compression Wrap Compression Stockings Add-Ons Wound #3 (Lower Leg) Wound Laterality: Right, Lateral Cleanser Soap and Water Discharge Instruction: May shower and wash wound with dial antibacterial soap and water prior to dressing change. Peri-Wound Care Sween Lotion (Moisturizing lotion) Discharge Instruction: Apply moisturizing lotion as directed Topical Gentamicin Discharge Instruction: As directed by physician Mupirocin Ointment Discharge Instruction: Apply Mupirocin (Bactroban) as instructed Primary Dressing Hydrofera Blue Classic Foam, 4x4 in Discharge Instruction: Moisten with saline prior to applying to wound bed Secondary Dressing Woven Gauze Sponges 2x2 in Discharge Instruction: Apply over primary dressing as directed. Zetuvit Plus 4x8 in Discharge Instruction: Apply over primary dressing as directed. Secured With Compression Wrap Kerlix Roll 4.5x3.1 (in/yd) Discharge Instruction: Apply Kerlix and Coban compression as directed. Coban Self-Adherent Wrap 4x5 (in/yd) Discharge Instruction: Apply over Kerlix as directed. Compression Stockings Add-Ons Electronic Signature(s) Signed: 07/21/2022 4:41:45 PM By: Kalman Shan DO Entered By: Kalman Shan on 07/21/2022 11:59:03 -------------------------------------------------------------------------------- Multi-Disciplinary Care  Plan Details Patient Name: Date of Service: [name] 07/21/2022 8:15 A M Medical Record Number: [id number] Patient Account Number:  [id number] Date of Birth/Sex: Treating RN: June 26, 1946 (76 y.o. [name] Primary Care Karlisha Mathena: Other Clinician: [name] Referring Breezie Micucci: Treating Lennon Boutwell/Extender: Hollie Beach in Treatment: 8 Active Inactive Pain, Acute or Chronic Nursing Diagnoses: Pain, acute or chronic: actual or potential Potential alteration in comfort, pain Goals: Patient will verbalize adequate pain control and receive pain control interventions during procedures as needed Date Initiated: 05/25/2022 Target Resolution Date: 08/04/2022 Goal Status: Active Patient/caregiver will verbalize comfort level met Date Initiated: 05/25/2022 Target Resolution Date: 08/05/2022 Goal Status: Active Interventions: Complete pain assessment as per visit requirements Encourage patient to take pain medications as prescribed Provide education on pain management Treatment Activities: Administer pain control measures as ordered : 05/25/2022 Notes: Venous Leg Ulcer Nursing Diagnoses: Knowledge deficit related to disease process and management Goals: Non-invasive venous studies are completed as ordered Date Initiated: 05/25/2022 Target Resolution Date: 07/17/2022 Goal Status: Active Interventions: Assess peripheral edema status every visit. Provide education on venous insufficiency Treatment Activities: Non-invasive vascular studies : 05/25/2022 Notes: Wound/Skin Impairment Nursing Diagnoses: Knowledge deficit related to ulceration/compromised skin integrity Goals: Patient/caregiver will verbalize understanding of skin care regimen Date Initiated: 05/25/2022 Target Resolution Date: 07/29/2022 Goal Status: Active Ulcer/skin breakdown will heal within 14 weeks Date Initiated: 05/25/2022 Target Resolution Date: 08/21/2022 Goal Status: Active Interventions: Assess patient/caregiver ability to obtain necessary supplies Assess patient/caregiver ability to perform ulcer/skin care regimen upon  admission and as needed Provide education on ulcer and skin care Treatment Activities: Skin care regimen initiated : 05/25/2022 Topical wound management initiated : 05/25/2022 Notes: Electronic Signature(s) Signed: 07/23/2022 5:16:03 PM By: [name] RN, BSN Entered By: [name] on 07/21/2022 08:27:40 -------------------------------------------------------------------------------- Pain Assessment Details Patient Name: Date of Service: [name], [place] RO [name] 07/21/2022 8:15 A M Medical Record Number: [id number] Patient Account Number: [id number] Date of Birth/Sex: Treating RN: 07/25/1946 (76 y.o. [name] Primary Care Kirsty Monjaraz: [name] Other Clinician: Referring Manju Kulkarni: Treating Morissa Obeirne/Extender: Hollie Beach in Treatment: 8 Active Problems Location of Pain Severity and Description of Pain Patient Has Paino No Site Locations Rate the pain. Current Pain Level: 0 Pain Management and Medication Current Pain Management: Medication: No Cold Application: No Rest: No Massage: No Activity: No T.E.N.S.: No Heat Application: No Leg drop or elevation: No Is the Current Pain Management Adequate: Adequate How does your wound impact your activities of daily livingo Sleep: No Bathing: No Appetite: No Relationship With Others: No Bladder Continence: No Emotions: No Bowel Continence: No Work: No Toileting: No Drive: No Dressing: No Hobbies: No Engineer, maintenance) Signed: 07/23/2022 5:16:03 PM By: [name] RN, BSN Entered By: [name] on 07/21/2022 08:25:04 -------------------------------------------------------------------------------- Patient/Caregiver Education Details Patient Name: Date of Service: [name] RO [name] 10/3/2023andnbsp8:15 A M Medical Record Number: [id number] Patient Account Number: [id number] Date of Birth/Gender: Treating RN: Feb 19, 1946 (76 y.o. [name] Primary Care Physician: [name] Other Clinician: Referring Physician: Treating Physician/Extender: Hollie Beach in Treatment: 8 Education Assessment Education Provided To: Patient and Caregiver Education Topics Provided Wound/Skin Impairment: Handouts: Skin Care Do's and Dont's Methods: Explain/Verbal Responses: Reinforcements needed Electronic Signature(s) Signed: 07/23/2022 5:16:03 PM By: [name] RN, BSN Entered By: [name] on 07/21/2022 08:27:52 -------------------------------------------------------------------------------- Wound Assessment Details Patient Name: Date of Service: [name], [place] RO [name] 07/21/2022 8:15 A M Medical  Record Number: [id number] Patient Account Number: [id number] Date of Birth/Sex: Treating RN: 1946-10-06 (76 y.o. [name], [name] Primary Care Indiana Pechacek: [name] Other Clinician: Referring Dorri Ozturk: Treating Marven Veley/Extender: Hollie Beach in Treatment: 8 Wound Status Wound Number: 2 Primary Abrasion Etiology: Wound Location: Left, Lateral Upper Leg Secondary Lymphedema Wounding Event: Trauma Etiology: Date Acquired: 06/20/2022 Wound Status: Open Weeks Of Treatment: 4 Comorbid Cataracts, Anemia, Congestive Heart Failure, Hypertension, Clustered Wound: No History: Osteoarthritis Photos Wound Measurements Length: (cm) 0.3 Width: (cm) 2.3 Depth: (cm) 1 Area: (cm) 0.542 Volume: (cm) 0.542 % Reduction in Area: 95.8% % Reduction in Volume: 79.1% Epithelialization: Large (67-100%) Tunneling: No Undermining: No Wound Description Classification: Full Thickness With Exposed Support Structures Wound Margin: Distinct, outline attached Exudate Amount: Medium Exudate Type: Serosanguineous Exudate Color: red, brown Foul Odor After Cleansing: No Slough/Fibrino Yes Wound Bed Granulation Amount: Medium (34-66%) Exposed Structure Granulation Quality: Red, Pink Fascia Exposed: No Necrotic Amount: Medium  (34-66%) Fat Layer (Subcutaneous Tissue) Exposed: Yes Necrotic Quality: Adherent Slough Tendon Exposed: No Muscle Exposed: No Joint Exposed: No Bone Exposed: No Periwound Skin Texture Texture Color No Abnormalities Noted: No No Abnormalities Noted: No Callus: No Atrophie Blanche: No Crepitus: No Cyanosis: No Excoriation: No Ecchymosis: No Induration: No Erythema: No Rash: No Hemosiderin Staining: Yes Scarring: No Mottled: No Pallor: No Moisture Rubor: No No Abnormalities Noted: No Dry / Scaly: No Temperature / Pain Maceration: No Temperature: No Abnormality Treatment Notes Wound #2 (Upper Leg) Wound Laterality: Left, Lateral Cleanser dakin's solution Discharge Instruction: clean with dakins Peri-Wound Care Topical Primary Dressing Hydrofera Blue Ready Foam, 2.5 x2.5 in Discharge Instruction: Apply to wound bed as instructed MediHoney Gel, tube 1.5 (oz) Discharge Instruction: Apply to wound bed as instructed Secondary Dressing Woven Gauze Sponges 2x2 in Discharge Instruction: Apply over primary dressing as directed. Zetuvit Plus Silicone Border Dressing 7x7(in/in) Discharge Instruction: Apply silicone border over primary dressing as directed. Secured With Compression Wrap Compression Stockings Environmental education officer) Signed: 07/23/2022 5:16:03 PM By: [name] RN, BSN Entered By: [name] on 07/21/2022 08:29:53 -------------------------------------------------------------------------------- Wound Assessment Details Patient Name: Date of Service: [name] RO [name] 07/21/2022 8:15 A M Medical Record Number: [id number] Patient Account Number: [id number] Date of Birth/Sex: Treating RN: 1946-03-10 (76 y.o. [name], [name] Primary Care Alera Quevedo: Other Clinician: [name] Referring Yzabella Crunk: Treating Kache Mcclurg/Extender: Hollie Beach in Treatment: 8 Wound Status Wound Number: 3 Primary Trauma,  Other Etiology: Wound Location: Right, Lateral Lower Leg Wound Status: Open Wounding Event: Trauma Comorbid Cataracts, Anemia, Congestive Heart Failure, Hypertension, Date Acquired: 06/20/2022 History: Osteoarthritis Weeks Of Treatment: 4 Clustered Wound: No Photos Wound Measurements Length: (cm) 3.5 Width: (cm) 4.5 Depth: (cm) 0.7 Area: (cm) 12.37 Volume: (cm) 8.659 % Reduction in Area: 68.5% % Reduction in Volume: -10.2% Epithelialization: Small (1-33%) Tunneling: No Undermining: Yes Starting Position (o'clock): 11 Ending Position (o'clock): 12 Maximum Distance: (cm) 1 Wound Description Classification: Full Thickness With Exposed Support Struct Wound Margin: Distinct, outline attached Exudate Amount: Medium Exudate Type: Serosanguineous Exudate Color: red, brown ures Foul Odor After Cleansing: No Slough/Fibrino Yes Wound Bed Granulation Amount: Small (1-33%) Exposed Structure Granulation Quality: Red, Pink Fascia Exposed: No Necrotic Amount: Large (67-100%) Fat Layer (Subcutaneous Tissue) Exposed: Yes Necrotic Quality: Adherent Slough Tendon Exposed: No Muscle Exposed: No Joint Exposed: No Bone Exposed: No Periwound Skin Texture Texture Color No Abnormalities Noted: Yes No Abnormalities Noted: No Atrophie Blanche: No Moisture Cyanosis: No No Abnormalities Noted: Yes Ecchymosis: No Erythema: No Hemosiderin  Staining: Yes Mottled: No Pallor: No Rubor: No Temperature / Pain Temperature: No Abnormality Treatment Notes Wound #3 (Lower Leg) Wound Laterality: Right, Lateral Cleanser Soap and Water Discharge Instruction: May shower and wash wound with dial antibacterial soap and water prior to dressing change. Peri-Wound Care Sween Lotion (Moisturizing lotion) Discharge Instruction: Apply moisturizing lotion as directed Topical Gentamicin Discharge Instruction: As directed by physician Mupirocin Ointment Discharge Instruction: Apply Mupirocin  (Bactroban) as instructed Primary Dressing Hydrofera Blue Classic Foam, 4x4 in Discharge Instruction: Moisten with saline prior to applying to wound bed Secondary Dressing Woven Gauze Sponges 2x2 in Discharge Instruction: Apply over primary dressing as directed. Zetuvit Plus 4x8 in Discharge Instruction: Apply over primary dressing as directed. Secured With Compression Wrap Kerlix Roll 4.5x3.1 (in/yd) Discharge Instruction: Apply Kerlix and Coban compression as directed. Coban Self-Adherent Wrap 4x5 (in/yd) Discharge Instruction: Apply over Kerlix as directed. Compression Stockings Add-Ons Electronic Signature(s) Signed: 07/23/2022 5:16:03 PM By: [name] RN, BSN Entered By: [name] on 07/21/2022 08:26:54 -------------------------------------------------------------------------------- Vitals Details Patient Name: Date of Service: [name], [place] RO [name] 07/21/2022 8:15 A M Medical Record Number: [id number] Patient Account Number: [id number] Date of Birth/Sex: Treating RN: Jan 21, 1946 (76 y.o. [name], [name] Primary Care Provider: [name] Other Clinician: Referring Provider: Treating Provider/Extender: Hollie Beach in Treatment: 8 Vital Signs Time Taken: 08:20 Temperature (F): 98.2 Height (in): 63 Pulse (bpm): 72 Respiratory Rate (breaths/min): 18 Blood Pressure (mmHg): 132/82 Reference Range: 80 - 120 mg / dl Electronic Signature(s) Signed: 07/23/2022 5:16:03 PM By: [name] RN, BSN Entered By: [name] on 07/21/2022 08:24:56

## 2022-07-28 ENCOUNTER — Encounter (HOSPITAL_BASED_OUTPATIENT_CLINIC_OR_DEPARTMENT_OTHER): Payer: Medicare HMO | Admitting: Internal Medicine

## 2022-07-28 DIAGNOSIS — E1122 Type 2 diabetes mellitus with diabetic chronic kidney disease: Secondary | ICD-10-CM | POA: Diagnosis not present

## 2022-07-28 DIAGNOSIS — L97822 Non-pressure chronic ulcer of other part of left lower leg with fat layer exposed: Secondary | ICD-10-CM | POA: Diagnosis not present

## 2022-07-28 DIAGNOSIS — Z79899 Other long term (current) drug therapy: Secondary | ICD-10-CM | POA: Diagnosis not present

## 2022-07-28 DIAGNOSIS — N183 Chronic kidney disease, stage 3 unspecified: Secondary | ICD-10-CM | POA: Diagnosis not present

## 2022-07-28 DIAGNOSIS — L97812 Non-pressure chronic ulcer of other part of right lower leg with fat layer exposed: Secondary | ICD-10-CM

## 2022-07-28 DIAGNOSIS — E11622 Type 2 diabetes mellitus with other skin ulcer: Secondary | ICD-10-CM | POA: Diagnosis not present

## 2022-07-28 DIAGNOSIS — I872 Venous insufficiency (chronic) (peripheral): Secondary | ICD-10-CM | POA: Diagnosis not present

## 2022-07-28 DIAGNOSIS — I87313 Chronic venous hypertension (idiopathic) with ulcer of bilateral lower extremity: Secondary | ICD-10-CM | POA: Diagnosis not present

## 2022-07-28 NOTE — Progress Notes (Signed)
DONNABELLE, BLANCHARD ([id number]) [id number] Page 1 of 10 Visit Report for 07/28/2022 Chief Complaint Document Details Patient Name: Date of Service: [name] 07/28/2022 8:15 A M Medical Record Number: [id number] Patient Account Number: [id number] Date of Birth/Sex: Treating RN: [birth year], [name] (76 y.o. F) Primary Care Provider: [name] Other Clinician: Referring Provider: Treating Provider/Extender: Hollie Beach in Treatment: 9 Information Obtained from: Patient Chief Complaint 05/25/2022; left lower extremity wound following a dog scratch 06/23/2022; lacerations to the right and left lower extremity status post fall Electronic Signature(s) Signed: 07/28/2022 10:03:43 AM By: Brandy Shan DO Entered By: [name] on 07/28/2022 08:43:53 -------------------------------------------------------------------------------- Debridement Details Patient Name: Date of Service: [name], [name]. 07/28/2022 8:15 A M Medical Record Number: [id number] Patient Account Number: [id number] Date of Birth/Sex: Treating RN: [name][birth year] (76 y.o. [name], [name] Primary Care Provider: [name] Other Clinician: Referring Provider: Treating Provider/Extender: Hollie Beach in Treatment: 9 Debridement Performed for Assessment: Wound #3 Right,Lateral Lower Leg Performed By: Physician Brandy Shan, DO Debridement Type: Debridement Level of Consciousness (Pre-procedure): Awake and Alert Pre-procedure Verification/Time Out Yes - 08:35 Taken: Start Time: 08:36 Pain Control: Lidocaine 5% topical ointment T Area Debrided (L x W): otal 3 (cm) x 4 (cm) = 12 (cm) Tissue and other material debrided: Viable, Non-Viable, Slough, Subcutaneous, Slough Level: Skin/Subcutaneous Tissue Debridement Description: Excisional Instrument: Curette Bleeding: Minimum Hemostasis Achieved: Pressure End Time: 08:40 Procedural Pain:  0 Post Procedural Pain: 1 Response to Treatment: Procedure was tolerated well Level of Consciousness (Post- Awake and Alert procedure): Post Debridement Measurements of Total Wound Length: (cm) 3 Width: (cm) 4 Depth: (cm) 0.5 Volume: (cm) 4.712 Character of Wound/Ulcer Post Debridement: Improved [name], [name] ([id number]) [id number] Page 2 of 10 Post Procedure Diagnosis Same as Pre-procedure Electronic Signature(s) Signed: 07/28/2022 10:03:43 AM By: Brandy Shan DO Signed: 07/28/2022 6:12:04 PM By: [name] RN, BSN Entered By: [name] on 07/28/2022 08:40:19 -------------------------------------------------------------------------------- HPI Details Patient Name: Date of Service: [name], [name]. 07/28/2022 8:15 A M Medical Record Number: [id number] Patient Account Number: [id number] Date of Birth/Sex: Treating RN: [name], [name] (76 y.o. F) Primary Care Provider: [name] Other Clinician: Referring Provider: Treating Provider/Extender: Hollie Beach in Treatment: 9 History of Present Illness HPI Description: Admission 05/25/2022 [name] is a 76 year old female with a past medical history of diet-controlled type 2 diabetes, chronic 3 stage kidney disease and venous insufficiency that presents to the clinic for a 1 month history of nonhealing ulcer to the left leg. She states that her dog scratched her causing a wound. She has developed cellulitis in this leg and has been treated with clindamycin by her primary care physician. She reports improvement in her symptoms. She currently denies signs of infection. She has been keeping the area covered. She does not wear compression stockings. She is on 80 mg of Lasix twice daily. She has had reflux studies to the left leg on 07/2021 that notes venous reflux throughout the left common femoral vein and greater saphenous vein. She has not had an ablation. She  follows with vein and vascular for her venous insufficiency. 8/14; patient presents for follow-up. She states that the wrap stayed on for 4 days and eventually slid down. She has been wearing her compression stocking and doing dressing changes with Hydrofera Blue since. She also states she has a hard time putting on her shoes with the 3 layer compression. She denies signs  of infection. 8/[name]; patient presents for follow-up. She again had trouble with the wrap sliding down. We have been using Hydrofera Blue with gentamicin under 2 layer Coflex. She currently denies signs of infection. 8/28; patient presents for follow-up. She reports taking the wrap off yesterday. We have been using Hydrofera Blue with gentamicin under 2 layer Coflex. She states that the wrap does slide down but remains over the wound. She is currently wearing her compression stocking. She would like to do this instead of the compression wrap. 9/5; patient presents for follow-up. She has been using Medihoney to the left lower extremity remedy wound with her compression stockings. She reports no drainage from the site. Unfortunately she fell over the weekend and had to go to the ED due to lacerations she experienced on her left and right lower extremity. On the left leg she had 10 sutures placed on the right leg she had 7. She was given Keflex. Today she reports no signs of infection. She has been using Xeroform to the suture sites. 9/11; patient presents for follow-up. She has been using bacitracin ointment to the suture line. She currently denies signs of infection. 9/19; patient presents for follow-up. She has been using Medihoney to the wound beds. She denies signs of infection. 9/26; patient presents for follow-up. She has been using Dakin's wet-to-dry dressings to the wound beds. She reports improvement in wound healing. She has no issues or complaints today. 10/3; patient presents for follow-up. We had used compression therapy  along with Hydrofera Blue to the right lower extremity. She states she felt uncomfortable with the wrap and took it off 3 days ago. She has been using Medihoney without issues to the left lower extremity wound. She denies signs of infection. 10/10; patient presents for follow-up. She has been using Hydrofera Blue and Medihoney to the left lower extremity wound. We have been using Hydrofera Blue and Santyl under compression therapy to the right lower extremity. She states that the wrap slid down 2 days ago and she took it off. She has no issues or complaints today. She denies signs of infection. Electronic Signature(s) Signed: 07/28/2022 10:03:43 AM By: Brandy Shan DO Entered By: [name] on 07/28/2022 08:45:22 [name] ([id number]) [id number] Page 3 of 10 -------------------------------------------------------------------------------- Physical Exam Details Patient Name: Date of Service: Brandy Corolla [name]. 07/28/2022 8:15 A M Medical Record Number: [id number] Patient Account Number: [id number] Date of Birth/Sex: Treating RN: [birth year][name] (76 y.o. F) Primary Care Provider: [name] Other Clinician: Referring Provider: Treating Provider/Extender: Hollie Beach in Treatment: 9 Constitutional respirations regular, non-labored and within target range for patient.. Cardiovascular 2+ dorsalis pedis/posterior tibialis pulses. Psychiatric pleasant and cooperative. Notes Right lower extremity: Nonviable tissue and granulation tissue in the wound bed. 2+ pitting edema to the knee. Left lower extremity: 3 small narrow openings with increased depth of about 1 to 2 cm. No signs of infection. Electronic Signature(s) Signed: 07/28/2022 10:03:43 AM By: Brandy Shan DO Entered By: [name] on 07/28/2022 08:50:05 -------------------------------------------------------------------------------- Physician Orders  Details Patient Name: Date of Service: [name], [name]. 07/28/2022 8:15 A M Medical Record Number: [id number] Patient Account Number: [id number] Date of Birth/Sex: Treating RN: [birth year][name] (76 y.o. [name] Primary Care Provider: [name] Other Clinician: Referring Provider: Treating Provider/Extender: Hollie Beach in Treatment: 9 Verbal / Phone Orders: No Diagnosis Coding ICD-10 Coding Code Description [phone] Non-pressure chronic ulcer of other part of left lower leg with  fat layer exposed L97.812 Non-pressure chronic ulcer of other part of right lower leg with fat layer exposed I87.313 Chronic venous hypertension (idiopathic) with ulcer of bilateral lower extremity E11.622 Type 2 diabetes mellitus with other skin ulcer T79.8XXA Other early complications of trauma, initial encounter S81.801A Unspecified open wound, right lower leg, initial encounter S81.802A Unspecified open wound, left lower leg, initial encounter Follow-up Appointments ppointment in 1 week. - Dr. Heber [place] and [place], Room 8 Tuesday Return A ppointment in 2 weeks. - Dr. Heber [place] and [place], Room 8 Tuesday Return A Anesthetic (In clinic) Topical Lidocaine 5% applied to wound bed Bathing/ Shower/ Hygiene Brandy shower with protection but do not get wound dressing(s) wet. Edema Control - Lymphedema / SCD / Other Elevate legs to the level of the heart or above for 30 minutes daily and/or when sitting, a frequency of: - 2-3 times a day throughout the day. Avoid standing for long periods of time. Exercise regularly Moisturize legs daily. - lotion both legs every night before bed. Compression stocking or Garment 20-30 mm/Hg pressure to: - apply in the morning and remove at night. [name], [name] ([id number]) [id number] Page 4 of 10 Wound Treatment Wound #2 - Upper Leg Wound Laterality: Left, Lateral Cleanser: dakin's solution Every Other Day/30  Days Discharge Instructions: clean with dakins Prim Dressing: Hydrofera Blue Ready Foam, 2.5 x2.5 in Every Other Day/30 Days ary Discharge Instructions: Pack into the tunneling/open wound beds. Secondary Dressing: Woven Gauze Sponges 2x2 in Every Other Day/30 Days Discharge Instructions: Apply over primary dressing as directed. Secondary Dressing: Zetuvit Plus Silicone Border Dressing 7x7(in/in) Every Other Day/30 Days Discharge Instructions: Apply silicone border over primary dressing as directed. Wound #3 - Lower Leg Wound Laterality: Right, Lateral Cleanser: Soap and Water 1 x Per Week/30 Days Discharge Instructions: Brandy shower and wash wound with dial antibacterial soap and water prior to dressing change. Peri-Wound Care: Sween Lotion (Moisturizing lotion) 1 x Per Week/30 Days Discharge Instructions: Apply moisturizing lotion as directed Prim Dressing: Hydrofera Blue Classic Foam, 4x4 in 1 x Per Week/30 Days ary Discharge Instructions: Moisten with saline prior to applying to wound bed Prim Dressing: Santyl Ointment 1 x Per Week/30 Days ary Discharge Instructions: Apply nickel thick amount to wound bed as instructed Secondary Dressing: Woven Gauze Sponges 2x2 in 1 x Per Week/30 Days Discharge Instructions: Apply over primary dressing as directed. Secondary Dressing: Zetuvit Plus 4x8 in 1 x Per Week/30 Days Discharge Instructions: Apply over primary dressing as directed. Compression Wrap: Kerlix Roll 4.5x3.1 (in/yd) 1 x Per Week/30 Days Discharge Instructions: Apply Kerlix and Coban compression as directed. Compression Wrap: Coban Self-Adherent Wrap 4x5 (in/yd) 1 x Per Week/30 Days Discharge Instructions: Apply over Kerlix as directed. Electronic Signature(s) Signed: 07/28/2022 10:03:43 AM By: Brandy Shan DO Entered By: [name] on 07/28/2022 08:50:13 -------------------------------------------------------------------------------- Problem List Details Patient Name:  Date of Service: [name], [name]. 07/28/2022 8:15 A M Medical Record Number: [id number] Patient Account Number: [id number] Date of Birth/Sex: Treating RN: [name][birth year] (76 y.o. [name] Primary Care Provider: [name] Other Clinician: Referring Provider: Treating Provider/Extender: Hollie Beach in Treatment: 9 Active Problems ICD-10 Encounter Code Description Active Date MDM Diagnosis [phone] Non-pressure chronic ulcer of other part of left lower leg with fat layer exposed8/04/2022 No Yes L97.812 Non-pressure chronic ulcer of other part of right lower leg with fat layer 07/14/2022 No Yes exposed [name], [name] ([id number]) [id number] Page 5 of 10 I87.313 Chronic venous hypertension (idiopathic) with ulcer  of bilateral lower extremity 07/14/2022 No Yes E11.622 Type 2 diabetes mellitus with other skin ulcer 05/25/2022 No Yes T79.8XXA Other early complications of trauma, initial encounter 06/23/2022 No Yes S81.801A Unspecified open wound, right lower leg, initial encounter 06/23/2022 No Yes S81.802A Unspecified open wound, left lower leg, initial encounter 06/23/2022 No Yes Inactive Problems Resolved Problems ICD-10 Code Description Active Date Resolved Date W54.8XXA Other contact with dog, initial encounter 05/25/2022 05/25/2022 Electronic Signature(s) Signed: 07/28/2022 10:03:43 AM By: Brandy Shan DO Entered By: [name] on 07/28/2022 08:42:28 -------------------------------------------------------------------------------- Progress Note Details Patient Name: Date of Service: [name], [name]. 07/28/2022 8:15 A M Medical Record Number: [id number] Patient Account Number: [id number] Date of Birth/Sex: Treating RN: [name], [name] (76 y.o. F) Primary Care Provider: [name] Other Clinician: Referring Provider: Treating Provider/Extender: Hollie Beach in Treatment: 9 Subjective Chief  Complaint Information obtained from Patient 05/25/2022; left lower extremity wound following a dog scratch 06/23/2022; lacerations to the right and left lower extremity status post fall History of Present Illness (HPI) Admission 05/25/2022 Ms. Nalee Lightle is a 76 year old female with a past medical history of diet-controlled type 2 diabetes, chronic 3 stage kidney disease and venous insufficiency that presents to the clinic for a 1 month history of nonhealing ulcer to the left leg. She states that her dog scratched her causing a wound. She has developed cellulitis in this leg and has been treated with clindamycin by her primary care physician. She reports improvement in her symptoms. She currently denies signs of infection. She has been keeping the area covered. She does not wear compression stockings. She is on 80 mg of Lasix twice daily. She has had reflux studies to the left leg on 07/2021 that notes venous reflux throughout the left common femoral vein and greater saphenous vein. She has not had an ablation. She follows with vein and vascular for her venous insufficiency. 8/14; patient presents for follow-up. She states that the wrap stayed on for 4 days and eventually slid down. She has been wearing her compression stocking and doing dressing changes with Hydrofera Blue since. She also states she has a hard time putting on her shoes with the 3 layer compression. She denies signs of infection. 8/[name]; patient presents for follow-up. She again had trouble with the wrap sliding down. We have been using Hydrofera Blue with gentamicin under 2 layer Coflex. She currently denies signs of infection. 8/28; patient presents for follow-up. She reports taking the wrap off yesterday. We have been using Hydrofera Blue with gentamicin under 2 layer Coflex. She states that the wrap does slide down but remains over the wound. She is currently wearing her compression stocking. She would like to do this instead of  the [name], [name] ([id number]) [id number] Page 6 of 10 compression wrap. 9/5; patient presents for follow-up. She has been using Medihoney to the left lower extremity remedy wound with her compression stockings. She reports no drainage from the site. Unfortunately she fell over the weekend and had to go to the ED due to lacerations she experienced on her left and right lower extremity. On the left leg she had 10 sutures placed on the right leg she had 7. She was given Keflex. Today she reports no signs of infection. She has been using Xeroform to the suture sites. 9/11; patient presents for follow-up. She has been using bacitracin ointment to the suture line. She currently denies signs of infection. 9/19; patient presents for follow-up. She has been using Medihoney  to the wound beds. She denies signs of infection. 9/26; patient presents for follow-up. She has been using Dakin's wet-to-dry dressings to the wound beds. She reports improvement in wound healing. She has no issues or complaints today. 10/3; patient presents for follow-up. We had used compression therapy along with Hydrofera Blue to the right lower extremity. She states she felt uncomfortable with the wrap and took it off 3 days ago. She has been using Medihoney without issues to the left lower extremity wound. She denies signs of infection. 10/10; patient presents for follow-up. She has been using Hydrofera Blue and Medihoney to the left lower extremity wound. We have been using Hydrofera Blue and Santyl under compression therapy to the right lower extremity. She states that the wrap slid down 2 days ago and she took it off. She has no issues or complaints today. She denies signs of infection. Patient History Information obtained from Patient. Family History Cancer - Siblings, Diabetes - Siblings, Heart Disease - Mother, Hypertension - Mother, No family history of Hereditary Spherocytosis, Kidney  Disease, Lung Disease, Seizures, Stroke, Thyroid Problems, Tuberculosis. Social History Never smoker, Marital Status - Married, Alcohol Use - Never, Drug Use - No History, Caffeine Use - Moderate. Medical History Eyes Patient has history of Cataracts Denies history of Glaucoma Ear/Nose/Mouth/Throat Denies history of Chronic sinus problems/congestion, Middle ear problems Hematologic/Lymphatic Patient has history of Anemia Denies history of Hemophilia, Human Immunodeficiency Virus, Lymphedema, Sickle Cell Disease Respiratory Denies history of Aspiration, Asthma, Chronic Obstructive Pulmonary Disease (COPD), Pneumothorax, Sleep Apnea, Tuberculosis Cardiovascular Patient has history of Congestive Heart Failure, Hypertension Denies history of Angina, Arrhythmia, Coronary Artery Disease, Deep Vein Thrombosis, Hypotension, Myocardial Infarction, Peripheral Arterial Disease, Peripheral Venous Disease, Phlebitis, Vasculitis Gastrointestinal Denies history of Cirrhosis , Colitis, Crohnoos, Hepatitis A, Hepatitis [name], Hepatitis C Endocrine Denies history of Type I Diabetes, Type II Diabetes Genitourinary Denies history of End Stage Renal Disease Immunological Denies history of Lupus Erythematosus, Raynaudoos, Scleroderma Integumentary (Skin) Denies history of History of Burn Musculoskeletal Patient has history of Osteoarthritis Denies history of Gout, Rheumatoid Arthritis, Osteomyelitis Neurologic Denies history of Dementia, Neuropathy, Quadriplegia, Paraplegia, Seizure Disorder Oncologic Denies history of Received Chemotherapy Psychiatric Denies history of Anorexia/bulimia, Confinement Anxiety Hospitalization/Surgery History - right total knee replacement. Medical A Surgical History Notes nd Gastrointestinal diverticulosis hiatal hernia hyperlipidemia Musculoskeletal DJD Objective Constitutional respirations regular, non-labored and within target range for patient.[name] ([id number]) [id number] Page 7 of 10 Vitals Time Taken: 8:15 AM, Height: 63 in, Temperature: 98.2 F, Pulse: 76 bpm, Respiratory Rate: 20 breaths/min, Blood Pressure: 143/87 mmHg. Cardiovascular 2+ dorsalis pedis/posterior tibialis pulses. Psychiatric pleasant and cooperative. General Notes: Right lower extremity: Nonviable tissue and granulation tissue in the wound bed. 2+ pitting edema to the knee. Left lower extremity: 3 small narrow openings with increased depth of about 1 to 2 cm. No signs of infection. Integumentary (Hair, Skin) Wound #2 status is Open. Original cause of wound was Trauma. The date acquired was: 06/20/2022. The wound has been in treatment 5 weeks. The wound is located on the Left,Lateral Upper Leg. The wound measures 0.4cm length x 3.6cm width x 1.7cm depth; 1.131cm^2 area and 1.923cm^3 volume. There is Fat Layer (Subcutaneous Tissue) exposed. There is no tunneling or undermining noted. There is a medium amount of serosanguineous drainage noted. The wound margin is distinct with the outline attached to the wound base. There is medium (34-66%) red, pink granulation within the wound bed. There is a medium (34-66%) amount of necrotic tissue  within the wound bed including Adherent Slough. The periwound skin appearance exhibited: Hemosiderin Staining. The periwound skin appearance did not exhibit: Callus, Crepitus, Excoriation, Induration, Rash, Scarring, Dry/Scaly, Maceration, Atrophie Blanche, Cyanosis, Ecchymosis, Mottled, Pallor, Rubor, Erythema. Periwound temperature was noted as No Abnormality. Wound #3 status is Open. Original cause of wound was Trauma. The date acquired was: 06/20/2022. The wound has been in treatment 5 weeks. The wound is located on the Right,Lateral Lower Leg. The wound measures 3cm length x 4cm width x 0.5cm depth; 9.425cm^2 area and 4.712cm^3 volume. There is Fat Layer (Subcutaneous Tissue) exposed. There is no  tunneling noted, however, there is undermining starting at 10:00 and ending at 1:00 with a maximum distance of 0.4cm. There is a medium amount of serosanguineous drainage noted. The wound margin is distinct with the outline attached to the wound base. There is medium (34-66%) red, pink, hyper - granulation within the wound bed. There is a medium (34-66%) amount of necrotic tissue within the wound bed including Adherent Slough. The periwound skin appearance had no abnormalities noted for texture. The periwound skin appearance had no abnormalities noted for moisture. The periwound skin appearance exhibited: Hemosiderin Staining. The periwound skin appearance did not exhibit: Atrophie Blanche, Cyanosis, Ecchymosis, Mottled, Pallor, Rubor, Erythema. Periwound temperature was noted as No Abnormality. Assessment Active Problems ICD-10 Non-pressure chronic ulcer of other part of left lower leg with fat layer exposed Non-pressure chronic ulcer of other part of right lower leg with fat layer exposed Chronic venous hypertension (idiopathic) with ulcer of bilateral lower extremity Type 2 diabetes mellitus with other skin ulcer Other early complications of trauma, initial encounter Unspecified open wound, right lower leg, initial encounter Unspecified open wound, left lower leg, initial encounter Patient's wounds appear well-healing. I recommended continuing the course with Hydrofera Blue and stopping Medihoney to the left lower extremity. I debrided nonviable tissue to the right lower extremity. I recommended Santyl and Hydrofera Blue under compression therapy here. Follow-up in 1 week. Procedures Wound #3 Pre-procedure diagnosis of Wound #3 is a Trauma, Other located on the Right,Lateral Lower Leg . There was a Excisional Skin/Subcutaneous Tissue Debridement with a total area of 12 sq cm performed by Brandy Shan, DO. With the following instrument(s): Curette to remove Viable and  Non-Viable tissue/material. Material removed includes Subcutaneous Tissue and Slough and after achieving pain control using Lidocaine 5% topical ointment. A time out was conducted at 08:35, prior to the start of the procedure. A Minimum amount of bleeding was controlled with Pressure. The procedure was tolerated well with a pain level of 0 throughout and a pain level of 1 following the procedure. Post Debridement Measurements: 3cm length x 4cm width x 0.5cm depth; 4.712cm^3 volume. Character of Wound/Ulcer Post Debridement is improved. Post procedure Diagnosis Wound #3: Same as Pre-Procedure Plan Follow-up Appointments: Return Appointment in 1 week. - Dr. Heber [place] and [place], Room 8 Tuesday Return Appointment in 2 weeks. - Dr. Heber [place] and [place], Room 8 Tuesday Anesthetic: (In clinic) Topical Lidocaine 5% applied to wound bed Bathing/ Shower/ Hygiene: Brandy shower with protection but do not get wound dressing(s) wet. Edema Control - Lymphedema / SCD / Other: Elevate legs to the level of the heart or above for 30 minutes daily and/or when sitting, a frequency of: - 2-3 times a day throughout the day. Avoid standing for long periods of time. Exercise regularly Moisturize legs daily. - lotion both legs every night before bed. Compression stocking or Garment 20-30 mm/Hg pressure to: - apply in the  morning and remove at night. AVON, MOLOCK ([id number]) [id number] Page 8 of 10 WOUND #2: - Upper Leg Wound Laterality: Left, Lateral Cleanser: dakin's solution Every Other Day/30 Days Discharge Instructions: clean with dakins Prim Dressing: Hydrofera Blue Ready Foam, 2.5 x2.5 in Every Other Day/30 Days ary Discharge Instructions: Pack into the tunneling/open wound beds. Secondary Dressing: Woven Gauze Sponges 2x2 in Every Other Day/30 Days Discharge Instructions: Apply over primary dressing as directed. Secondary Dressing: Zetuvit Plus Silicone Border Dressing  7x7(in/in) Every Other Day/30 Days Discharge Instructions: Apply silicone border over primary dressing as directed. WOUND #3: - Lower Leg Wound Laterality: Right, Lateral Cleanser: Soap and Water 1 x Per Week/30 Days Discharge Instructions: Brandy shower and wash wound with dial antibacterial soap and water prior to dressing change. Peri-Wound Care: Sween Lotion (Moisturizing lotion) 1 x Per Week/30 Days Discharge Instructions: Apply moisturizing lotion as directed Prim Dressing: Hydrofera Blue Classic Foam, 4x4 in 1 x Per Week/30 Days ary Discharge Instructions: Moisten with saline prior to applying to wound bed Prim Dressing: Santyl Ointment 1 x Per Week/30 Days ary Discharge Instructions: Apply nickel thick amount to wound bed as instructed Secondary Dressing: Woven Gauze Sponges 2x2 in 1 x Per Week/30 Days Discharge Instructions: Apply over primary dressing as directed. Secondary Dressing: Zetuvit Plus 4x8 in 1 x Per Week/30 Days Discharge Instructions: Apply over primary dressing as directed. Com pression Wrap: Kerlix Roll 4.5x3.1 (in/yd) 1 x Per Week/30 Days Discharge Instructions: Apply Kerlix and Coban compression as directed. Com pression Wrap: Coban Self-Adherent Wrap 4x5 (in/yd) 1 x Per Week/30 Days Discharge Instructions: Apply over Kerlix as directed. 1. In office sharp debridement 2. Hydrofera Blue 3. Santyl 4. Kerlix/Coban 5. Follow-up in 1 week Electronic Signature(s) Signed: 07/28/2022 10:03:43 AM By: Brandy Shan DO Entered By: [name] on 07/28/2022 08:51:12 -------------------------------------------------------------------------------- HxROS Details Patient Name: Date of Service: [name], [name]. 07/28/2022 8:15 A M Medical Record Number: [id number] Patient Account Number: [id number] Date of Birth/Sex: Treating RN: [birth year] (76 y.o. F) Primary Care Provider: [name] Other Clinician: Referring Provider: Treating Provider/Extender: Hollie Beach in Treatment: 9 Information Obtained From Patient Eyes Medical History: Positive for: Cataracts Negative for: Glaucoma Ear/Nose/Mouth/Throat Medical History: Negative for: Chronic sinus problems/congestion; Middle ear problems Hematologic/Lymphatic Medical History: Positive for: Anemia Negative for: Hemophilia; Human Immunodeficiency Virus; Lymphedema; Sickle Cell Disease Respiratory Medical History: Negative for: Aspiration; Asthma; Chronic Obstructive Pulmonary Disease (COPD); Pneumothorax; Sleep Apnea; Tuberculosis LAURAN, ROMANSKI ([id number]) [id number] Page 9 of 10 Cardiovascular Medical History: Positive for: Congestive Heart Failure; Hypertension Negative for: Angina; Arrhythmia; Coronary Artery Disease; Deep Vein Thrombosis; Hypotension; Myocardial Infarction; Peripheral Arterial Disease; Peripheral Venous Disease; Phlebitis; Vasculitis Gastrointestinal Medical History: Negative for: Cirrhosis ; Colitis; Crohns; Hepatitis A; Hepatitis [name]; Hepatitis C Past Medical History Notes: diverticulosis hiatal hernia hyperlipidemia Endocrine Medical History: Negative for: Type I Diabetes; Type II Diabetes Genitourinary Medical History: Negative for: End Stage Renal Disease Immunological Medical History: Negative for: Lupus Erythematosus; Raynauds; Scleroderma Integumentary (Skin) Medical History: Negative for: History of Burn Musculoskeletal Medical History: Positive for: Osteoarthritis Negative for: Gout; Rheumatoid Arthritis; Osteomyelitis Past Medical History Notes: DJD Neurologic Medical History: Negative for: Dementia; Neuropathy; Quadriplegia; Paraplegia; Seizure Disorder Oncologic Medical History: Negative for: Received Chemotherapy Psychiatric Medical History: Negative for: Anorexia/bulimia; Confinement Anxiety HBO Extended History Items Eyes: Cataracts Immunizations Pneumococcal  Vaccine: Received Pneumococcal Vaccination: Yes Received Pneumococcal Vaccination On or After 60th Birthday: Yes Implantable Devices None Hospitalization / Surgery History Type of Hospitalization/Surgery right  total knee replacement Family and Social History Cancer: Yes - Siblings; Diabetes: Yes - Siblings; Heart Disease: Yes - Mother; Hereditary Spherocytosis: No; Hypertension: Yes - Mother; Kidney DiseaseELASHA, TESS ([id number]) [id number] Page 10 of 10 No; Lung Disease: No; Seizures: No; Stroke: No; Thyroid Problems: No; Tuberculosis: No; Never smoker; Marital Status - Married; Alcohol Use: Never; Drug Use: No History; Caffeine Use: Moderate; Financial Concerns: No; Food, Clothing or Shelter Needs: No; Support System Lacking: No; Transportation Concerns: No Electronic Signature(s) Signed: 07/28/2022 10:03:43 AM By: Brandy Shan DO Entered By: [name] on 07/28/2022 08:45:29 -------------------------------------------------------------------------------- SuperBill Details Patient Name: Date of Service: [name], [name]. 07/28/2022 Medical Record Number: [id number] Patient Account Number: [id number] Date of Birth/Sex: Treating RN: [name], [name] (76 y.o. [name], [name] Primary Care Provider: [name] Other Clinician: Referring Provider: Treating Provider/Extender: Hollie Beach in Treatment: 9 Diagnosis Coding ICD-10 Codes Code Description [id number] Non-pressure chronic ulcer of other part of left lower leg with fat layer exposed L97.812 Non-pressure chronic ulcer of other part of right lower leg with fat layer exposed I87.313 Chronic venous hypertension (idiopathic) with ulcer of bilateral lower extremity E11.622 Type 2 diabetes mellitus with other skin ulcer T79.8XXA Other early complications of trauma, initial encounter S81.801A Unspecified open wound, right lower leg, initial encounter S81.802A  Unspecified open wound, left lower leg, initial encounter Facility Procedures : CPT4 Code: [id number] Description: [place] TISSUE 20 SQ CM/< ICD-10 Diagnosis Description L97.812 Non-pressure chronic ulcer of other part of right lower leg with fat layer exp Modifier: osed Quantity: 1 Physician Procedures : CPT4 Code Description Modifier [id number] - WC PHYS SUBQ TISS 20 SQ CM ICD-10 Diagnosis Description R51.884 Non-pressure chronic ulcer of other part of right lower leg with fat layer exposed Quantity: 1 Electronic Signature(s) Signed: 07/28/2022 10:03:43 AM By: Brandy Shan DO Entered By: [name] on 07/28/2022 08:51:20

## 2022-07-28 NOTE — Progress Notes (Signed)
SOLARA, GOODCHILD ([id number]) [id number] Page 1 of 10 Visit Report for 07/28/2022 Arrival Information Details Patient Name: Date of Service: [name] 07/28/2022 8:15 A M Medical Record Number: [id number] Patient Account Number: [id number] Date of Birth/Sex: Treating [name]: 05/08/1946 (76 y.o. [name], [name] Primary Care [name]: [name] Other Clinician: Referring [name]: Treating [name]/Extender: [name] in Treatment: 9 Visit Information History Since Last Visit Added or deleted any medications: No Patient Arrived: [name] Any new allergies or adverse reactions: No Arrival Time: 08:15 Had a fall or experienced change in No Accompanied By: son activities of daily living that may affect Transfer Assistance: None risk of falls: Patient Identification Verified: Yes Signs or symptoms of abuse/neglect since last visito No Secondary Verification Process Completed: Yes Hospitalized since last visit: No Patient Requires Transmission-Based Precautions: No Implantable device outside of the clinic excluding No Patient Has Alerts: No cellular tissue based products placed in the center since last visit: Has Dressing in Place as Prescribed: No Pain Present Now: No Notes per patient compression wrap on right leg fell down yesterday remove, applied hydrofera blue and a bandage with compression stocking and came into appt today. Electronic Signature(s) Signed: 07/28/2022 6:12:04 PM By: [name], BSN Entered By: [name] on 07/28/2022 08:27:04 -------------------------------------------------------------------------------- Encounter Discharge Information Details Patient Name: Date of Service: [name], [name] [name]. 07/28/2022 8:15 A M Medical Record Number: [id number] Patient Account Number: [id number] Date of Birth/Sex: Treating [name]: [birth year] (76 y.o. [name], [name] Primary Care [name]: [name]  Other Clinician: Referring [name]: Treating [name]/Extender: [name] in Treatment: 9 Encounter Discharge Information Items Post Procedure Vitals Discharge Condition: Stable Temperature (F): 98.2 Ambulatory Status: Walker Pulse (bpm): 76 Discharge Destination: Home Respiratory Rate (breaths/min): 16 Transportation: Private Auto Blood Pressure (mmHg): 143/87 Accompanied By: son Schedule Follow-up Appointment: Yes Clinical Summary of Care: Electronic Signature(s) Signed: 07/28/2022 6:12:04 PM By: [name], BSN Entered By: [name] on 07/28/2022 08:41:00 [name] ([id number]) [id number] Page 2 of 10 -------------------------------------------------------------------------------- Lower Extremity Assessment Details Patient Name: Date of Service: Brandy Corolla [name] [name]. 07/28/2022 8:15 A M Medical Record Number: [id number] Patient Account Number: [id number] Date of Birth/Sex: Treating [name]: Oct 25, 1945 (76 y.o. [name] Primary Care [name]: [name] Other Clinician: Referring [name]: Treating [name]/Extender: [name] in Treatment: 9 Edema Assessment Assessed: [name]: No] [name]: Yes] Edema: [Left: Yes] [Right: Yes] Calf Left: Right: Point of Measurement: 41 cm From Medial Instep 44 cm Ankle Left: Right: Point of Measurement: 8 cm From Medial Instep 27 cm Vascular Assessment Pulses: Dorsalis Pedis Palpable: [Right:Yes] Electronic Signature(s) Signed: 07/28/2022 6:12:04 PM By: [name], BSN Entered By: [name] on 07/28/2022 08:27:50 -------------------------------------------------------------------------------- Multi Wound Chart Details Patient Name: Date of Service: [name], [name] [name]. 07/28/2022 8:15 A M Medical Record Number: [id number] Patient Account Number: [id number] Date of Birth/Sex: Treating [name]: 11-27-1945 (76 y.o. F) Primary Care  [name]: [name] Other Clinician: Referring [name]: Treating [name]/Extender: [name] in Treatment: 9 Vital Signs Height(in): 63 Pulse(bpm): 76 Weight(lbs): Blood Pressure(mmHg): 143/87 Body Mass Index(BMI): Temperature(F): 98.2 Respiratory Rate(breaths/min): 20 [2:Photos:] [[name]:[name] [id number] Page 3 of 10] Left, Lateral Upper Leg Right, Lateral Lower Leg [name] Wound Location: Trauma Trauma [name] Wounding Event: Abrasion Trauma, Other [name] Primary Etiology: Lymphedema [name] [name] Secondary Etiology: Cataracts, Anemia, Congestive Heart Cataracts, Anemia, Congestive Heart [name] Comorbid History: Failure, Hypertension, Osteoarthritis Failure, Hypertension,  Osteoarthritis 06/20/2022 06/20/2022 [name] Date Acquired: 5 5 [name] Weeks of Treatment: Open Open [name] Wound Status: No No [name] Wound Recurrence: 0.4x3.6x1.7 3x4x0.5 [name] Measurements L x W x D (cm) 1.131 9.425 [name] A (cm) : rea 1.923 4.712 [name] Volume (cm) : 91.30% 76.00% [name] % Reduction in A rea: 25.80% 40.00% [name] % Reduction in Volume: 10 Starting Position 1 (o'clock): 1 Ending Position 1 (o'clock): 0.4 Maximum Distance 1 (cm): No Yes [name] Undermining: Full Thickness With Exposed Support Full Thickness With Exposed Support [name] Classification: Structures Structures Medium Medium [name] Exudate A mount: Serosanguineous Serosanguineous [name] Exudate Type: red, brown red, brown [name] Exudate Color: Distinct, outline attached Distinct, outline attached [name] Wound Margin: Medium (34-66%) Medium (34-66%) [name] Granulation A mount: Red, Pink Red, Pink, Hyper-granulation [name] Granulation Quality: Medium (34-66%) Medium (34-66%) [name] Necrotic A mount: Fat Layer (Subcutaneous Tissue): Yes Fat Layer (Subcutaneous Tissue): Yes [name] Exposed Structures: Fascia: No Fascia: No Tendon: No Tendon: No Muscle: No Muscle: No Joint: No Joint: No Bone: No Bone: No Medium  (34-66%) Small (1-33%) [name] Epithelialization: [name] Debridement - Excisional [name] Debridement: Pre-procedure Verification/Time Out [name] 08:35 [name] Taken: [name] Lidocaine 5% topical ointment [name] Pain Control: [name] Subcutaneous, Slough [name] Tissue Debrided: [name] Skin/Subcutaneous Tissue [name] Level: [name] 12 [name] Debridement A (sq cm): rea [name] Curette [name] Instrument: [name] Minimum [name] Bleeding: [name] Pressure [name] Hemostasis A chieved: [name] 0 [name] Procedural Pain: [name] 1 [name] Post Procedural Pain: [name] Procedure was tolerated well [name] Debridement Treatment Response: [name] 3x4x0.5 [name] Post Debridement Measurements L x W x D (cm) [name] 4.712 [name] Post Debridement Volume: (cm) Excoriation: No Excoriation: No [name] Periwound Skin Texture: Induration: No Induration: No Callus: No Callus: No Crepitus: No Crepitus: No Rash: No Rash: No Scarring: No Scarring: No Maceration: No Maceration: No [name] Periwound Skin Moisture: Dry/Scaly: No Dry/Scaly: No Hemosiderin Staining: Yes Hemosiderin Staining: Yes [name] Periwound Skin Color: Atrophie Blanche: No Atrophie Blanche: No Cyanosis: No Cyanosis: No Ecchymosis: No Ecchymosis: No Erythema: No Erythema: No Mottled: No Mottled: No Pallor: No Pallor: No Rubor: No Rubor: No No Abnormality No Abnormality [name] Temperature: [name] Debridement [name] Procedures Performed: Treatment Notes Wound #2 (Upper Leg) Wound Laterality: Left, Lateral Cleanser dakin's solution Discharge Instruction: clean with dakins Peri-Wound Care Topical Primary Dressing Hydrofera Blue Ready Foam, 2.5 x2.5 in Discharge Instruction: Pack into the tunneling/open wound beds. SHARAH, FINNELL ([id number]) [id number] Page 4 of 10 Secondary Dressing Woven Gauze Sponges 2x2 in Discharge Instruction: Apply over primary dressing as directed. Zetuvit Plus Silicone Border Dressing 7x7(in/in) Discharge Instruction: Apply silicone border over primary  dressing as directed. Secured With Compression Wrap Compression Stockings Add-Ons Wound #3 (Lower Leg) Wound Laterality: Right, Lateral Cleanser Soap and Water Discharge Instruction: May shower and wash wound with dial antibacterial soap and water prior to dressing change. Peri-Wound Care Sween Lotion (Moisturizing lotion) Discharge Instruction: Apply moisturizing lotion as directed Topical Primary Dressing Hydrofera Blue Classic Foam, 4x4 in Discharge Instruction: Moisten with saline prior to applying to wound bed Santyl Ointment Discharge Instruction: Apply nickel thick amount to wound bed as instructed Secondary Dressing Woven Gauze Sponges 2x2 in Discharge Instruction: Apply over primary dressing as directed. Zetuvit Plus 4x8 in Discharge Instruction: Apply over primary dressing as directed. Secured With Compression Wrap Kerlix Roll 4.5x3.1 (in/yd) Discharge Instruction: Apply Kerlix and Coban compression as directed. Coban Self-Adherent Wrap 4x5 (in/yd) Discharge Instruction: Apply over Kerlix as directed. Compression Stockings Add-Ons Electronic Signature(s) Signed: 07/28/2022 10:03:43 AM By: Kalman Shan DO Entered By:  Hoffman, Jessica on 07/28/2022 08:42:34 -------------------------------------------------------------------------------- Multi-Disciplinary Care Plan Details Patient Name: Date of Service: [name], [name] [name]. 07/28/2022 8:15 A M Medical Record Number: [id number] Patient Account Number: [id number] Date of Birth/Sex: Treating [name]: 02/26/1946 (76 y.o. F) Deaton, Bobbi Primary Care Provider: John, James Other Clinician: Referring Provider: Treating Provider/Extender: Hoffman, Jessica John, James Weeks in Treatment: 9 Moudy, Gearline [name] ([id number]) [id number] Page 5 of 10 Active Inactive Pain, Acute or Chronic Nursing Diagnoses: Pain, acute or chronic: actual or potential Potential alteration in comfort,  pain Goals: Patient will verbalize adequate pain control and receive pain control interventions during procedures as needed Date Initiated: 05/25/2022 Target Resolution Date: 08/14/2022 Goal Status: Active Patient/caregiver will verbalize comfort level met Date Initiated: 05/25/2022 Target Resolution Date: 08/14/2022 Goal Status: Active Interventions: Complete pain assessment as per visit requirements Encourage patient to take pain medications as prescribed Provide education on pain management Treatment Activities: Administer pain control measures as ordered : 05/25/2022 Notes: Venous Leg Ulcer Nursing Diagnoses: Knowledge deficit related to disease process and management Goals: Non-invasive venous studies are completed as ordered Date Initiated: 05/25/2022 Target Resolution Date: 08/14/2022 Goal Status: Active Interventions: Assess peripheral edema status every visit. Provide education on venous insufficiency Treatment Activities: Non-invasive vascular studies : 05/25/2022 Notes: Wound/Skin Impairment Nursing Diagnoses: Knowledge deficit related to ulceration/compromised skin integrity Goals: Patient/caregiver will verbalize understanding of skin care regimen Date Initiated: 05/25/2022 Target Resolution Date: 08/14/2022 Goal Status: Active Ulcer/skin breakdown will heal within 14 weeks Date Initiated: 05/25/2022 Target Resolution Date: 08/21/2022 Goal Status: Active Interventions: Assess patient/caregiver ability to obtain necessary supplies Assess patient/caregiver ability to perform ulcer/skin care regimen upon admission and as needed Provide education on ulcer and skin care Treatment Activities: Skin care regimen initiated : 05/25/2022 Topical wound management initiated : 05/25/2022 Notes: Electronic Signature(s) Signed: 07/28/2022 6:12:04 PM By: Deaton, Bobbi [name], BSN Entered By: Deaton, Bobbi on 07/28/2022 08:29:47 Craddock, Kimberely [name] ([id number])  [id number] Page 6 of 10 -------------------------------------------------------------------------------- Pain Assessment Details Patient Name: Date of Service: [name], [name] [name]. 07/28/2022 8:15 A M Medical Record Number: [id number] Patient Account Number: [id number] Date of Birth/Sex: Treating [name]: 02/26/1946 (76 y.o. F) Deaton, Bobbi Primary Care Provider: John, James Other Clinician: Referring Provider: Treating Provider/Extender: Hoffman, Jessica John, James Weeks in Treatment: 9 Active Problems Location of Pain Severity and Description of Pain Patient Has Paino No Site Locations Rate the pain. Current Pain Level: 0 Pain Management and Medication Current Pain Management: Medication: No Cold Application: No Rest: No Massage: No Activity: No T.E.[name].S.: No Heat Application: No Leg drop or elevation: No Is the Current Pain Management Adequate: Adequate How does your wound impact your activities of daily livingo Sleep: No Bathing: No Appetite: No Relationship With Others: No Bladder Continence: No Emotions: No Bowel Continence: No Work: No Toileting: No Drive: No Dressing: No Hobbies: No Electronic Signature(s) Signed: 07/28/2022 6:12:04 PM By: Deaton, Bobbi [name], BSN Entered By: Deaton, Bobbi on 07/28/2022 08:27:26 -------------------------------------------------------------------------------- Patient/Caregiver Education Details Patient Name: Date of Service: [name], [name] [name]. 10/10/2023andnbsp8:15 A M Medical Record Number: [id number] Patient Account Number: [id number] Date of Birth/Gender: Treating [name]: 09/29/1946 (76 y.o. F) Deaton, Bobbi Primary Care Physician: John, James Other Clinician: Referring Physician: Treating Physician/Extender: Hoffman, Jessica John, James Weeks in Treatment: 9 [name], [name] ([id number]) [id number] Page 7 of 10 Education Assessment Education Provided  To: Patient Education Topics Provided Wound/Skin Impairment: Handouts: Skin Care Do's and Dont's Methods: Explain/Verbal Responses: Reinforcements needed Electronic Signature(s) Signed: 07/28/2022 6:12:04   PM By: Deaton, Bobbi [name], BSN Entered By: Deaton, Bobbi on 07/28/2022 08:29:59 -------------------------------------------------------------------------------- Wound Assessment Details Patient Name: Date of Service: [name], [name] [name]. 07/28/2022 8:15 A M Medical Record Number: [id number] Patient Account Number: [id number] Date of Birth/Sex: Treating [name]: 09/27/1946 (76 y.o. F) Deaton, Bobbi Primary Care Provider: John, James Other Clinician: Referring Provider: Treating Provider/Extender: Hoffman, Jessica John, James Weeks in Treatment: 9 Wound Status Wound Number: 2 Primary Abrasion Etiology: Wound Location: Left, Lateral Upper Leg Secondary Lymphedema Wounding Event: Trauma Etiology: Date Acquired: 06/20/2022 Wound Status: Open Weeks Of Treatment: 5 Comorbid Cataracts, Anemia, Congestive Heart Failure, Hypertension, Clustered Wound: No History: Osteoarthritis Photos Wound Measurements Length: (cm) 0.4 Width: (cm) 3.6 Depth: (cm) 1.7 Area: (cm) 1.131 Volume: (cm) 1.923 % Reduction in Area: 91.3% % Reduction in Volume: 25.8% Epithelialization: Medium (34-66%) Tunneling: No Undermining: No Wound Description Classification: Full Thickness With Exposed Support Structures Wound Margin: Distinct, outline attached Exudate Amount: Medium Exudate Type: Serosanguineous Exudate Color: red, brown Foul Odor After Cleansing: No Slough/Fibrino Yes Wound Bed [name], [name] ([id number]) [id number] Page 8 of 10 Granulation Amount: Medium (34-66%) Exposed Structure Granulation Quality: Red, Pink Fascia Exposed: No Necrotic Amount: Medium (34-66%) Fat Layer (Subcutaneous Tissue) Exposed: Yes Necrotic Quality: Adherent Slough Tendon Exposed:  No Muscle Exposed: No Joint Exposed: No Bone Exposed: No Periwound Skin Texture Texture Color No Abnormalities Noted: No No Abnormalities Noted: No Callus: No Atrophie Blanche: No Crepitus: No Cyanosis: No Excoriation: No Ecchymosis: No Induration: No Erythema: No Rash: No Hemosiderin Staining: Yes Scarring: No Mottled: No Pallor: No Moisture Rubor: No No Abnormalities Noted: No Dry / Scaly: No Temperature / Pain Maceration: No Temperature: No Abnormality Treatment Notes Wound #2 (Upper Leg) Wound Laterality: Left, Lateral Cleanser dakin's solution Discharge Instruction: clean with dakins Peri-Wound Care Topical Primary Dressing Hydrofera Blue Ready Foam, 2.5 x2.5 in Discharge Instruction: Pack into the tunneling/open wound beds. Secondary Dressing Woven Gauze Sponges 2x2 in Discharge Instruction: Apply over primary dressing as directed. Zetuvit Plus Silicone Border Dressing 7x7(in/in) Discharge Instruction: Apply silicone border over primary dressing as directed. Secured With Compression Wrap Compression Stockings Add-Ons Electronic Signature(s) Signed: 07/28/2022 6:12:04 PM By: Deaton, Bobbi [name], BSN Entered By: Deaton, Bobbi on 07/28/2022 08:28:56 -------------------------------------------------------------------------------- Wound Assessment Details Patient Name: Date of Service: [name], [name] [name]. 07/28/2022 8:15 A M Medical Record Number: [id number] Patient Account Number: [id number] Date of Birth/Sex: Treating [name]: 04/05/1946 (76 y.o. F) Deaton, Bobbi Primary Care Provider: John, James Other Clinician: Referring Provider: Treating Provider/Extender: Hoffman, Jessica John, James Weeks in Treatment: 9 Wound Status Wound Number: 3 Primary Trauma, Other Etiology: Wound Location: Right, Lateral Lower Leg [name], [name] ([id number]) [id number] Page 9 of 10 Wound Status: Open Wounding Event: Trauma Comorbid Cataracts,  Anemia, Congestive Heart Failure, Hypertension, Date Acquired: 06/20/2022 History: Osteoarthritis Weeks Of Treatment: 5 Clustered Wound: No Photos Wound Measurements Length: (cm) 3 Width: (cm) 4 Depth: (cm) 0.5 Area: (cm) 9.425 Volume: (cm) 4.712 % Reduction in Area: 76% % Reduction in Volume: 40% Epithelialization: Small (1-33%) Tunneling: No Undermining: Yes Starting Position (o'clock): 10 Ending Position (o'clock): 1 Maximum Distance: (cm) 0.4 Wound Description Classification: Full Thickness With Exposed Support Structures Wound Margin: Distinct, outline attached Exudate Amount: Medium Exudate Type: Serosanguineous Exudate Color: red, brown Foul Odor After Cleansing: No Slough/Fibrino Yes Wound Bed Granulation Amount: Medium (34-66%) Exposed Structure Granulation Quality: Red, Pink, Hyper-granulation Fascia Exposed: No Necrotic Amount: Medium (34-66%) Fat Layer (Subcutaneous Tissue) Exposed: Yes Necrotic Quality: Adherent Slough Tendon Exposed:   No Muscle Exposed: No Joint Exposed: No Bone Exposed: No Periwound Skin Texture Texture Color No Abnormalities Noted: Yes No Abnormalities Noted: No Atrophie Blanche: No Moisture Cyanosis: No No Abnormalities Noted: Yes Ecchymosis: No Erythema: No Hemosiderin Staining: Yes Mottled: No Pallor: No Rubor: No Temperature / Pain Temperature: No Abnormality Treatment Notes Wound #3 (Lower Leg) Wound Laterality: Right, Lateral Cleanser Soap and Water Discharge Instruction: May shower and wash wound with dial antibacterial soap and water prior to dressing change. Peri-Wound Care Sween Lotion (Moisturizing lotion) Discharge Instruction: Apply moisturizing lotion as directed Topical [name], [name] ([id number]) [id number] Page 10 of 10 Primary Dressing Hydrofera Blue Classic Foam, 4x4 in Discharge Instruction: Moisten with saline prior to applying to wound bed Santyl Ointment Discharge  Instruction: Apply nickel thick amount to wound bed as instructed Secondary Dressing Woven Gauze Sponges 2x2 in Discharge Instruction: Apply over primary dressing as directed. Zetuvit Plus 4x8 in Discharge Instruction: Apply over primary dressing as directed. Secured With Compression Wrap Kerlix Roll 4.5x3.1 (in/yd) Discharge Instruction: Apply Kerlix and Coban compression as directed. Coban Self-Adherent Wrap 4x5 (in/yd) Discharge Instruction: Apply over Kerlix as directed. Compression Stockings Add-Ons Electronic Signature(s) Signed: 07/28/2022 6:12:04 PM By: Deaton, Bobbi [name], BSN Entered By: Deaton, Bobbi on 07/28/2022 08:29:34 -------------------------------------------------------------------------------- Vitals Details Patient Name: Date of Service: [name], [name] [name]. 07/28/2022 8:15 A M Medical Record Number: [id number] Patient Account Number: [id number] Date of Birth/Sex: Treating [name]: 12/21/1945 (76 y.o. F) Deaton, Bobbi Primary Care Provider: John, James Other Clinician: Referring Provider: Treating Provider/Extender: Hoffman, Jessica John, James Weeks in Treatment: 9 Vital Signs Time Taken: 08:15 Temperature (°F): 98.2 Height (in): 63 Pulse (bpm): 76 Respiratory Rate (breaths/min): 20 Blood Pressure (mmHg): 143/87 Reference Range: 80 - 120 mg / dl Electronic Signature(s) Signed: 07/28/2022 6:12:04 PM By: Deaton, Bobbi [name], BSN Entered By: Deaton, Bobbi on 07/28/2022 08:27:18 

## 2022-08-04 ENCOUNTER — Encounter (HOSPITAL_BASED_OUTPATIENT_CLINIC_OR_DEPARTMENT_OTHER): Payer: Medicare HMO | Admitting: Internal Medicine

## 2022-08-04 DIAGNOSIS — I87313 Chronic venous hypertension (idiopathic) with ulcer of bilateral lower extremity: Secondary | ICD-10-CM | POA: Diagnosis not present

## 2022-08-04 DIAGNOSIS — L97822 Non-pressure chronic ulcer of other part of left lower leg with fat layer exposed: Secondary | ICD-10-CM | POA: Diagnosis not present

## 2022-08-04 DIAGNOSIS — L97812 Non-pressure chronic ulcer of other part of right lower leg with fat layer exposed: Secondary | ICD-10-CM | POA: Diagnosis not present

## 2022-08-04 DIAGNOSIS — I872 Venous insufficiency (chronic) (peripheral): Secondary | ICD-10-CM | POA: Diagnosis not present

## 2022-08-04 DIAGNOSIS — E11622 Type 2 diabetes mellitus with other skin ulcer: Secondary | ICD-10-CM | POA: Diagnosis not present

## 2022-08-04 DIAGNOSIS — N183 Chronic kidney disease, stage 3 unspecified: Secondary | ICD-10-CM | POA: Diagnosis not present

## 2022-08-04 DIAGNOSIS — Z79899 Other long term (current) drug therapy: Secondary | ICD-10-CM | POA: Diagnosis not present

## 2022-08-04 DIAGNOSIS — E1122 Type 2 diabetes mellitus with diabetic chronic kidney disease: Secondary | ICD-10-CM | POA: Diagnosis not present

## 2022-08-04 NOTE — Progress Notes (Signed)
LAURIEANNE, GALLOWAY ([id number]) [id number] Page 1 of 11 Visit Report for 08/04/2022 Chief Complaint Document Details Patient Name: Date of Service: [name] [name] 08/04/2022 8:15 A M Medical Record Number: [id number] Patient Account Number: [id number] Date of Birth/Sex: Treating RN: October 07, 1946 (76 y.o. F) Primary Care Provider: [name] Other Clinician: Referring Provider: Treating Provider/Extender: Hollie Beach in Treatment: 10 Information Obtained from: Patient Chief Complaint 05/25/2022; left lower extremity wound following a dog scratch 06/23/2022; lacerations to the right and left lower extremity status post fall Electronic Signature(s) Signed: 08/04/2022 11:13:24 AM By: Kalman Shan DO Entered By: Kalman Shan on 08/04/2022 09:06:22 -------------------------------------------------------------------------------- Debridement Details Patient Name: Date of Service: [name], [place] RO [name] 08/04/2022 8:15 A M Medical Record Number: [id number] Patient Account Number: [id number] Date of Birth/Sex: Treating RN: 01-22-1946 (76 y.o. Donalda Ewings Primary Care Provider: [name] Other Clinician: Referring Provider: Treating Provider/Extender: Hollie Beach in Treatment: 10 Debridement Performed for Assessment: Wound #3 Right,Lateral Lower Leg Performed By: Physician Kalman Shan, DO Debridement Type: Debridement Level of Consciousness (Pre-procedure): Awake and Alert Pre-procedure Verification/Time Out Yes - 08:43 Taken: Start Time: 08:43 Pain Control: Lidocaine 4% T opical Solution T Area Debrided (L x W): otal 2.3 (cm) x 3.3 (cm) = 7.59 (cm) Tissue and other material debrided: Non-Viable, Slough, Subcutaneous, Slough Level: Skin/Subcutaneous Tissue Debridement Description: Excisional Instrument: Curette Bleeding: Minimum Hemostasis Achieved: Pressure Procedural Pain: 0 Post  Procedural Pain: 0 Response to Treatment: Procedure was tolerated well Level of Consciousness (Post- Awake and Alert procedure): Post Debridement Measurements of Total Wound Length: (cm) 2.3 Width: (cm) 3.3 Depth: (cm) 0.7 Volume: (cm) 4.173 Character of Wound/Ulcer Post Debridement: Improved Post Procedure Diagnosis ARCHIE, ATILANO B ([id number]) [id number] Page 2 of 11 Same as Pre-procedure Notes scribed for Dr Heber Zay[place] by Lucky Rathke, RN Electronic Signature(s) Signed: 08/04/2022 11:13:24 AM By: Kalman Shan DO Signed: 08/04/2022 5:18:48 PM By: Sharyn Creamer RN, BSN Entered By: Sharyn Creamer on 08/04/2022 08:45:44 -------------------------------------------------------------------------------- Debridement Details Patient Name: Date of Service: [name], [place] [name] 08/04/2022 8:15 A M Medical Record Number: [id number] Patient Account Number: [id number] Date of Birth/Sex: Treating RN: 06-18-1946 (76 y.o. Donalda Ewings Primary Care Provider: [name] Other Clinician: Referring Provider: Treating Provider/Extender: Hollie Beach in Treatment: 10 Debridement Performed for Assessment: Wound #2 Left,Lateral Upper Leg Performed By: Physician Kalman Shan, DO Debridement Type: Debridement Level of Consciousness (Pre-procedure): Awake and Alert Pre-procedure Verification/Time Out Yes - 08:43 Taken: Start Time: 08:43 Pain Control: Lidocaine 4% T opical Solution T Area Debrided (L x W): otal 0.4 (cm) x 3.5 (cm) = 1.4 (cm) Tissue and other material debrided: Non-Viable, Slough, Subcutaneous, Slough Level: Skin/Subcutaneous Tissue Debridement Description: Excisional Instrument: Curette Bleeding: Minimum Hemostasis Achieved: Pressure Procedural Pain: 0 Post Procedural Pain: 0 Response to Treatment: Procedure was tolerated well Level of Consciousness (Post- Awake and Alert procedure): Post Debridement Measurements  of Total Wound Length: (cm) 0.4 Width: (cm) 3.5 Depth: (cm) 1.1 Volume: (cm) 1.21 Character of Wound/Ulcer Post Debridement: Improved Post Procedure Diagnosis Same as Pre-procedure Notes scribed for Dr Heber [place] by Lucky Rathke, RN Electronic Signature(s) Signed: 08/04/2022 11:13:24 AM By: Kalman Shan DO Signed: 08/04/2022 5:18:48 PM By: Sharyn Creamer RN, BSN Entered By: Sharyn Creamer on 08/04/2022 08:46:34 HPI Details -------------------------------------------------------------------------------- Thomasene Mohair ([id number]) [id number] Page 3 of 11 Patient Name: Date of Service: [name], [name] [name] 08/04/2022 8:15 A M Medical Record Number: [id number] Patient Account  Number: [id number] Date of Birth/Sex: Treating RN: 1946-06-10 (76 y.o. F) Primary Care Provider: [name] Other Clinician: Referring Provider: Treating Provider/Extender: Hollie Beach in Treatment: 10 History of Present Illness HPI Description: Admission 05/25/2022 [name] is a 76 year old female with a past medical history of diet-controlled type 2 diabetes, chronic 3 stage kidney disease and venous insufficiency that presents to the clinic for a 1 month history of nonhealing ulcer to the left leg. She states that her dog scratched her causing a wound. She has developed cellulitis in this leg and has been treated with clindamycin by her primary care physician. She reports improvement in her symptoms. She currently denies signs of infection. She has been keeping the area covered. She does not wear compression stockings. She is on 80 mg of Lasix twice daily. She has had reflux studies to the left leg on 07/2021 that notes venous reflux throughout the left common femoral vein and greater saphenous vein. She has not had an ablation. She follows with vein and vascular for her venous insufficiency. 8/14; patient presents for follow-up. She states that the  wrap stayed on for 4 days and eventually slid down. She has been wearing her compression stocking and doing dressing changes with Hydrofera Blue since. She also states she has a hard time putting on her shoes with the 3 layer compression. She denies signs of infection. 8/21; patient presents for follow-up. She again had trouble with the wrap sliding down. We have been using Hydrofera Blue with gentamicin under 2 layer Coflex. She currently denies signs of infection. 8/28; patient presents for follow-up. She reports taking the wrap off yesterday. We have been using Hydrofera Blue with gentamicin under 2 layer Coflex. She states that the wrap does slide down but remains over the wound. She is currently wearing her compression stocking. She would like to do this instead of the compression wrap. 9/5; patient presents for follow-up. She has been using Medihoney to the left lower extremity remedy wound with her compression stockings. She reports no drainage from the site. Unfortunately she fell over the weekend and had to go to the ED due to lacerations she experienced on her left and right lower extremity. On the left leg she had 10 sutures placed on the right leg she had 7. She was given Keflex. Today she reports no signs of infection. She has been using Xeroform to the suture sites. 9/11; patient presents for follow-up. She has been using bacitracin ointment to the suture line. She currently denies signs of infection. 9/19; patient presents for follow-up. She has been using Medihoney to the wound beds. She denies signs of infection. 9/26; patient presents for follow-up. She has been using Dakin's wet-to-dry dressings to the wound beds. She reports improvement in wound healing. She has no issues or complaints today. 10/3; patient presents for follow-up. We had used compression therapy along with Hydrofera Blue to the right lower extremity. She states she felt uncomfortable with the wrap and took it off  3 days ago. She has been using Medihoney without issues to the left lower extremity wound. She denies signs of infection. 10/10; patient presents for follow-up. She has been using Hydrofera Blue and Medihoney to the left lower extremity wound. We have been using Hydrofera Blue and Santyl under compression therapy to the right lower extremity. She states that the wrap slid down 2 days ago and she took it off. She has no issues or complaints today. She denies signs of infection.  10/17; patient presents for follow-up. We have been using Hydrofera Blue to the left lower extremity wounds and Hydrofera Blue and Santyl under compression therapy to the right lower extremity. She has no issues or complaints today. She denies signs of infection. Electronic Signature(s) Signed: 08/04/2022 11:13:24 AM By: Kalman Shan DO Entered By: Kalman Shan on 08/04/2022 09:07:10 -------------------------------------------------------------------------------- Physical Exam Details Patient Name: Date of Service: [name] RO [name] 08/04/2022 8:15 A M Medical Record Number: [id number] Patient Account Number: [id number] Date of Birth/Sex: Treating RN: May 24, 1946 (76 y.o. F) Primary Care Provider: [name] Other Clinician: Referring Provider: Treating Provider/Extender: Hollie Beach in Treatment: 10 Constitutional respirations regular, non-labored and within target range for patient.. Cardiovascular 2+ dorsalis pedis/posterior tibialis pulses. Psychiatric pleasant and cooperative. ZONNIQUE, NORKUS ([id number]) [id number] Page 4 of 11 Notes Right lower extremity: Nonviable tissue and granulation tissue in the wound bed. 2+ pitting edema to the knee. Left lower extremity: 3 small narrow openings with increased depth of about 1 to 2 cm. No signs of infection. Electronic Signature(s) Signed: 08/04/2022 11:13:24 AM By: Kalman Shan DO Entered By:  Kalman Shan on 08/04/2022 09:07:40 -------------------------------------------------------------------------------- Physician Orders Details Patient Name: Date of Service: [name], [place] RO [name] 08/04/2022 8:15 A M Medical Record Number: [id number] Patient Account Number: [id number] Date of Birth/Sex: Treating RN: 22-May-1946 (76 y.o. Donalda Ewings Primary Care Provider: [name] Other Clinician: Referring Provider: Treating Provider/Extender: Hollie Beach in Treatment: 10 Verbal / Phone Orders: No Diagnosis Coding Follow-up Appointments ppointment in 1 week. - Dr. Heber [place] and [place], Room 8 Tuesday Return A ppointment in 2 weeks. - Dr. Heber [place] and [place], Room 8 Tuesday Return A Anesthetic (In clinic) Topical Lidocaine 5% applied to wound bed Bathing/ Shower/ Hygiene May shower with protection but do not get wound dressing(s) wet. Edema Control - Lymphedema / SCD / Other Elevate legs to the level of the heart or above for 30 minutes daily and/or when sitting, a frequency of: - 2-3 times a day throughout the day. Avoid standing for long periods of time. Exercise regularly Moisturize legs daily. - lotion both legs every night before bed. Compression stocking or Garment 20-30 mm/Hg pressure to: - apply in the morning and remove at night. Wound Treatment Wound #2 - Upper Leg Wound Laterality: Left, Lateral Cleanser: dakin's solution Every Other Day/30 Days Discharge Instructions: clean with dakins Prim Dressing: KerraCel Ag Gelling Fiber Dressing, 2x2 in (silver alginate) (DME) (Generic) Every Other Day/30 Days ary Discharge Instructions: Apply silver alginate to wound bed as instructed Secondary Dressing: Woven Gauze Sponges 2x2 in Every Other Day/30 Days Discharge Instructions: Apply over primary dressing as directed. Secondary Dressing: Zetuvit Plus Silicone Border Dressing 7x7(in/in) Every Other Day/30 Days Discharge Instructions: Apply silicone  border over primary dressing as directed. Wound #3 - Lower Leg Wound Laterality: Right, Lateral Cleanser: Soap and Water 1 x Per Week/30 Days Discharge Instructions: May shower and wash wound with dial antibacterial soap and water prior to dressing change. Peri-Wound Care: Sween Lotion (Moisturizing lotion) 1 x Per Week/30 Days Discharge Instructions: Apply moisturizing lotion as directed Prim Dressing: Hydrofera Blue Classic Foam, 4x4 in 1 x Per Week/30 Days ary Discharge Instructions: Moisten with saline prior to applying to wound bed Prim Dressing: Santyl Ointment 1 x Per Week/30 Days ary Discharge Instructions: Apply nickel thick amount to wound bed as instructed Secondary Dressing: Woven Gauze Sponges 2x2 in 1 x Per Week/30 Days Discharge Instructions: Apply over primary  dressing as directed. JOLENE, GUYETT ([id number]) [id number] Page 5 of 11 Secondary Dressing: Zetuvit Plus 4x8 in 1 x Per Week/30 Days Discharge Instructions: Apply over primary dressing as directed. Compression Wrap: Kerlix Roll 4.5x3.1 (in/yd) 1 x Per Week/30 Days Discharge Instructions: Apply Kerlix and Coban compression as directed. Compression Wrap: Coban Self-Adherent Wrap 4x5 (in/yd) 1 x Per Week/30 Days Discharge Instructions: Apply over Kerlix as directed. Patient Medications llergies: doxycycline, oxycodone, soybean, Sulfa (Sulfonamide Antibiotics) A Notifications Medication Indication Start End prior to debridement 08/04/2022 lidocaine DOSE topical 4 % cream - cream topical Electronic Signature(s) Signed: 08/04/2022 11:25:44 AM By: Kalman Shan DO Signed: 08/04/2022 5:18:48 PM By: Sharyn Creamer RN, BSN Previous Signature: 08/04/2022 11:13:24 AM Version By: Kalman Shan DO Entered By: Sharyn Creamer on 08/04/2022 11:16:37 -------------------------------------------------------------------------------- Problem List Details Patient Name: Date of Service: [name], [place] RO [name] 08/04/2022 8:15 A M Medical Record Number: [id number] Patient Account Number: [id number] Date of Birth/Sex: Treating RN: May 10, 1946 (76 y.o. 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/04/2022 No Yes L97.812 Non-pressure chronic ulcer of other part of right lower leg with fat layer 07/14/2022 No Yes exposed I87.313 Chronic venous hypertension (idiopathic) with ulcer of bilateral lower extremity 07/14/2022 No Yes E11.622 Type 2 diabetes mellitus with other skin ulcer 05/25/2022 No Yes T79.8XXA Other early complications of trauma, initial encounter 06/23/2022 No Yes S81.801A Unspecified open wound, right lower leg, initial encounter 06/23/2022 No Yes S81.802A Unspecified open wound, left lower leg, initial encounter 06/23/2022 No Yes DEHLIA, KILNER B ([id number]) [id number] Page 6 of 11 Inactive Problems Resolved Problems ICD-10 Code Description Active Date Resolved Date W54.8XXA Other contact with dog, initial encounter 05/25/2022 05/25/2022 Electronic Signature(s) Signed: 08/04/2022 11:13:24 AM By: Kalman Shan DO Entered By: Kalman Shan on 08/04/2022 09:06:07 -------------------------------------------------------------------------------- Progress Note Details Patient Name: Date of Service: [name], [place] RO [name] 08/04/2022 8:15 A M Medical Record Number: [id number] Patient Account Number: [id number] Date of Birth/Sex: Treating RN: [birth year] (77 y.o. F) Primary Care Provider: [name] Other Clinician: Referring Provider: Treating Provider/Extender: Hollie Beach in Treatment: 10 Subjective Chief Complaint Information obtained from  Patient 05/25/2022; left lower extremity wound following a dog scratch 06/23/2022; lacerations to the right and left lower extremity status post fall History of Present Illness (HPI) Admission 05/25/2022 Ms. Alaysia Lightle is a 76 year old female with a past medical history of diet-controlled type 2 diabetes, chronic 3 stage kidney disease and venous insufficiency that presents to the clinic for a 1 month history of nonhealing ulcer to the left leg. She states that her dog scratched her causing a wound. She has developed cellulitis in this leg and has been treated with clindamycin by her primary care physician. She reports improvement in her symptoms. She currently denies signs of infection. She has been keeping the area covered. She does not wear compression stockings. She is on 80 mg of Lasix twice daily. She has had reflux studies to the left leg on 07/2021 that notes venous reflux throughout the left common femoral vein and greater saphenous vein. She has not had an ablation. She follows with vein and vascular for her venous insufficiency. 8/14; patient presents for follow-up. She states that the wrap stayed on for 4 days and eventually slid down. She has been  wearing her compression stocking and doing dressing changes with Hydrofera Blue since. She also states she has a hard time putting on her shoes with the 3 layer compression. She denies signs of infection. 8/21; patient presents for follow-up. She again had trouble with the wrap sliding down. We have been using Hydrofera Blue with gentamicin under 2 layer Coflex. She currently denies signs of infection. 8/28; patient presents for follow-up. She reports taking the wrap off yesterday. We have been using Hydrofera Blue with gentamicin under 2 layer Coflex. She states that the wrap does slide down but remains over the wound. She is currently wearing her compression stocking. She would like to do this instead of the compression wrap. 9/5; patient  presents for follow-up. She has been using Medihoney to the left lower extremity remedy wound with her compression stockings. She reports no drainage from the site. Unfortunately she fell over the weekend and had to go to the ED due to lacerations she experienced on her left and right lower extremity. On the left leg she had 10 sutures placed on the right leg she had 7. She was given Keflex. Today she reports no signs of infection. She has been using Xeroform to the suture sites. 9/11; patient presents for follow-up. She has been using bacitracin ointment to the suture line. She currently denies signs of infection. 9/19; patient presents for follow-up. She has been using Medihoney to the wound beds. She denies signs of infection. 9/26; patient presents for follow-up. She has been using Dakin's wet-to-dry dressings to the wound beds. She reports improvement in wound healing. She has no issues or complaints today. 10/3; patient presents for follow-up. We had used compression therapy along with Hydrofera Blue to the right lower extremity. She states she felt uncomfortable with the wrap and took it off 3 days ago. She has been using Medihoney without issues to the left lower extremity wound. She denies signs of infection. 10/10; patient presents for follow-up. She has been using Hydrofera Blue and Medihoney to the left lower extremity wound. We have been using Hydrofera Blue and Santyl under compression therapy to the right lower extremity. She states that the wrap slid down 2 days ago and she took it off. She has no issues or complaints today. She denies signs of infection. 10/17; patient presents for follow-up. We have been using Hydrofera Blue to the left lower extremity wounds and Hydrofera Blue and Santyl under compression therapy to the right lower extremity. She has no issues or complaints today. She denies signs of infection. Brandy, [name] ([id number]) [id number]  Page 7 of 11 Patient History Information obtained from Patient. Family History Cancer - Siblings, Diabetes - Siblings, Heart Disease - Mother, Hypertension - Mother, No family history of Hereditary Spherocytosis, Kidney Disease, Lung Disease, Seizures, Stroke, Thyroid Problems, Tuberculosis. Social History Never smoker, Marital Status - Married, Alcohol Use - Never, Drug Use - No History, Caffeine Use - Moderate. Medical History Eyes Patient has history of Cataracts Denies history of Glaucoma Ear/Nose/Mouth/Throat Denies history of Chronic sinus problems/congestion, Middle ear problems Hematologic/Lymphatic Patient has history of Anemia Denies history of Hemophilia, Human Immunodeficiency Virus, Lymphedema, Sickle Cell Disease Respiratory Denies history of Aspiration, Asthma, Chronic Obstructive Pulmonary Disease (COPD), Pneumothorax, Sleep Apnea, Tuberculosis Cardiovascular Patient has history of Congestive Heart Failure, Hypertension Denies history of Angina, Arrhythmia, Coronary Artery Disease, Deep Vein Thrombosis, Hypotension, Myocardial Infarction, Peripheral Arterial Disease, Peripheral Venous Disease, Phlebitis, Vasculitis Gastrointestinal Denies history of Cirrhosis , Colitis, Crohnoos, Hepatitis A, Hepatitis B,  Hepatitis C Endocrine Denies history of Type I Diabetes, Type II Diabetes Genitourinary Denies history of End Stage Renal Disease Immunological Denies history of Lupus Erythematosus, Raynaudoos, Scleroderma Integumentary (Skin) Denies history of History of Burn Musculoskeletal Patient has history of Osteoarthritis Denies history of Gout, Rheumatoid Arthritis, Osteomyelitis Neurologic Denies history of Dementia, Neuropathy, Quadriplegia, Paraplegia, Seizure Disorder Oncologic Denies history of Received Chemotherapy Psychiatric Denies history of Anorexia/bulimia, Confinement Anxiety Hospitalization/Surgery History - right total knee replacement. Medical A  Surgical History Notes nd Gastrointestinal diverticulosis hiatal hernia hyperlipidemia Musculoskeletal DJD Objective Constitutional respirations regular, non-labored and within target range for patient.. Vitals Time Taken: 8:24 AM, Height: 63 in, Temperature: 98.5 F, Pulse: 75 bpm, Respiratory Rate: 18 breaths/min, Blood Pressure: 162/78 mmHg. Cardiovascular 2+ dorsalis pedis/posterior tibialis pulses. Psychiatric pleasant and cooperative. General Notes: Right lower extremity: Nonviable tissue and granulation tissue in the wound bed. 2+ pitting edema to the knee. Left lower extremity: 3 small narrow openings with increased depth of about 1 to 2 cm. No signs of infection. Integumentary (Hair, Skin) Wound #2 status is Open. Original cause of wound was Trauma. The date acquired was: 06/20/2022. The wound has been in treatment 6 weeks. The wound is located on the Left,Lateral Upper Leg. The wound measures 0.4cm length x 3.5cm width x 1.2cm depth; 1.1cm^2 area and 1.319cm^3 volume. There is Fat Layer (Subcutaneous Tissue) exposed. There is no tunneling or undermining noted. There is a medium amount of serosanguineous drainage noted. The wound margin is distinct with the outline attached to the wound base. There is large (67-100%) red, pink granulation within the wound bed. There is a small (1-33%) amount of necrotic tissue within the wound bed including Adherent Slough. The periwound skin appearance had no abnormalities noted for texture. The periwound skin appearance had no abnormalities noted for moisture. The periwound skin appearance had no abnormalities noted for color. Periwound temperature was noted as No Abnormality. CHELLE, CAYTON ([id number]) [id number] Page 8 of 11 Wound #3 status is Open. Original cause of wound was Trauma. The date acquired was: 06/20/2022. The wound has been in treatment 6 weeks. The wound is located on the Right,Lateral Lower Leg. The  wound measures 2.3cm length x 3.3cm width x 0.7cm depth; 5.961cm^2 area and 4.173cm^3 volume. There is Fat Layer (Subcutaneous Tissue) exposed. There is no tunneling or undermining noted. There is a medium amount of serosanguineous drainage noted. The wound margin is distinct with the outline attached to the wound base. There is large (67-100%) red, pink, hyper - granulation within the wound bed. There is a small (1- 33%) amount of necrotic tissue within the wound bed including Adherent Slough. The periwound skin appearance had no abnormalities noted for texture. The periwound skin appearance had no abnormalities noted for moisture. The periwound skin appearance exhibited: Hemosiderin Staining. The periwound skin appearance did not exhibit: Atrophie Blanche, Cyanosis, Ecchymosis, Mottled, Pallor, Rubor, Erythema. Periwound temperature was noted as No Abnormality. Assessment Active Problems ICD-10 Non-pressure chronic ulcer of other part of left lower leg with fat layer exposed Non-pressure chronic ulcer of other part of right lower leg with fat layer exposed Chronic venous hypertension (idiopathic) with ulcer of bilateral lower extremity Type 2 diabetes mellitus with other skin ulcer Other early complications of trauma, initial encounter Unspecified open wound, right lower leg, initial encounter Unspecified open wound, left lower leg, initial encounter Patient's wounds have shown improvement in size and appearance since last clinic visit. I debrided nonviable tissue. I recommended continuing the course with [facility]  and Santyl under Kerlix/Coban to the right lower extremity. It is getting harder to pack the Hydrofera Blue to the left lower extremity size recommend switching to silver alginate. Follow-up in 1 week. Procedures Wound #2 Pre-procedure diagnosis of Wound #2 is an Abrasion located on the Left,Lateral Upper Leg . There was a Excisional Skin/Subcutaneous Tissue Debridement with a  total area of 1.4 sq cm performed by Kalman Shan, DO. With the following instrument(s): Curette to remove Non-Viable tissue/material. Material removed includes Subcutaneous Tissue and Slough and after achieving pain control using Lidocaine 4% T opical Solution. No specimens were taken. A time out was conducted at 08:43, prior to the start of the procedure. A Minimum amount of bleeding was controlled with Pressure. The procedure was tolerated well with a pain level of 0 throughout and a pain level of 0 following the procedure. Post Debridement Measurements: 0.4cm length x 3.5cm width x 1.1cm depth; 1.21cm^3 volume. Character of Wound/Ulcer Post Debridement is improved. Post procedure Diagnosis Wound #2: Same as Pre-Procedure General Notes: scribed for Dr Heber [place] by Lucky Rathke, RN. Wound #3 Pre-procedure diagnosis of Wound #3 is a Trauma, Other located on the Right,Lateral Lower Leg . There was a Excisional Skin/Subcutaneous Tissue Debridement with a total area of 7.59 sq cm performed by Kalman Shan, DO. With the following instrument(s): Curette to remove Non-Viable tissue/material. Material removed includes Subcutaneous Tissue and Slough and after achieving pain control using Lidocaine 4% T opical Solution. No specimens were taken. A time out was conducted at 08:43, prior to the start of the procedure. A Minimum amount of bleeding was controlled with Pressure. The procedure was tolerated well with a pain level of 0 throughout and a pain level of 0 following the procedure. Post Debridement Measurements: 2.3cm length x 3.3cm width x 0.7cm depth; 4.173cm^3 volume. Character of Wound/Ulcer Post Debridement is improved. Post procedure Diagnosis Wound #3: Same as Pre-Procedure General Notes: scribed for Dr Heber [place] by Lucky Rathke, RN. Plan Follow-up Appointments: Return Appointment in 1 week. - Dr. Heber [place] and [place], Room 8 Tuesday Return Appointment in 2 weeks. - Dr. Heber [place], Room 8  Tuesday Anesthetic: (In clinic) Topical Lidocaine 5% applied to wound bed Bathing/ Shower/ Hygiene: May shower with protection but do not get wound dressing(s) wet. Edema Control - Lymphedema / SCD / Other: Elevate legs to the level of the heart or above for 30 minutes daily and/or when sitting, a frequency of: - 2-3 times a day throughout the day. Avoid standing for long periods of time. Exercise regularly Moisturize legs daily. - lotion both legs every night before bed. Compression stocking or Garment 20-30 mm/Hg pressure to: - apply in the morning and remove at night. The following medication(s) was prescribed: lidocaine topical 4 % cream cream topical for prior to debridement was prescribed at facility WOUND #2: - Upper Leg Wound Laterality: Left, Lateral Cleanser: dakin's solution Every Other Day/30 Days Discharge Instructions: clean with dakins Prim Dressing: KerraCel Ag Gelling Fiber Dressing, 2x2 in (silver alginate) Every Other Day/30 Days ary Discharge Instructions: Apply silver alginate to wound bed as instructed Secondary Dressing: Woven Gauze Sponges 2x2 in Every Other Day/30 Days Discharge Instructions: Apply over primary dressing as directed. Secondary Dressing: Zetuvit Plus Silicone Border Dressing 7x7(in/in) Every Other Day/30 Days Brandy, [name] ([id number]) [id number] Page 9 of 11 Discharge Instructions: Apply silicone border over primary dressing as directed. WOUND #3: - Lower Leg Wound Laterality: Right, Lateral Cleanser: Soap and Water 1 x Per Week/30 Days Discharge Instructions:  May shower and wash wound with dial antibacterial soap and water prior to dressing change. Peri-Wound Care: Sween Lotion (Moisturizing lotion) 1 x Per Week/30 Days Discharge Instructions: Apply moisturizing lotion as directed Prim Dressing: Hydrofera Blue Classic Foam, 4x4 in 1 x Per Week/30 Days ary Discharge Instructions: Moisten with saline prior to  applying to wound bed Prim Dressing: Santyl Ointment 1 x Per Week/30 Days ary Discharge Instructions: Apply nickel thick amount to wound bed as instructed Secondary Dressing: Woven Gauze Sponges 2x2 in 1 x Per Week/30 Days Discharge Instructions: Apply over primary dressing as directed. Secondary Dressing: Zetuvit Plus 4x8 in 1 x Per Week/30 Days Discharge Instructions: Apply over primary dressing as directed. Com pression Wrap: Kerlix Roll 4.5x3.1 (in/yd) 1 x Per Week/30 Days Discharge Instructions: Apply Kerlix and Coban compression as directed. Com pression Wrap: Coban Self-Adherent Wrap 4x5 (in/yd) 1 x Per Week/30 Days Discharge Instructions: Apply over Kerlix as directed. 1. In office sharp debridement 2. Hydrofera Blue and Santyl under Kerlix/cobanooright lower extremity 3. Silver alginateooleft lower extremity 4. Follow-up in 1 week Electronic Signature(s) Signed: 08/04/2022 11:13:24 AM By: Kalman Shan DO Entered By: Kalman Shan on 08/04/2022 09:09:48 -------------------------------------------------------------------------------- HxROS Details Patient Name: Date of Service: [name], [place] RO [name] 08/04/2022 8:15 A M Medical Record Number: [id number] Patient Account Number: [id number] Date of Birth/Sex: Treating RN: Jun 16, 1946 (76 y.o. F) Primary Care Provider: [name] Other Clinician: Referring Provider: Treating Provider/Extender: Hollie Beach in Treatment: 10 Information Obtained From Patient Eyes Medical History: Positive for: Cataracts Negative for: Glaucoma Ear/Nose/Mouth/Throat Medical History: Negative for: Chronic sinus problems/congestion; Middle ear problems Hematologic/Lymphatic Medical History: Positive for: Anemia Negative for: Hemophilia; Human Immunodeficiency Virus; Lymphedema; Sickle Cell Disease Respiratory Medical History: Negative for: Aspiration; Asthma; Chronic Obstructive Pulmonary Disease (COPD);  Pneumothorax; Sleep Apnea; Tuberculosis Cardiovascular Medical History: Positive for: Congestive Heart Failure; Hypertension Negative for: Angina; Arrhythmia; Coronary Artery Disease; Deep Vein Thrombosis; Hypotension; Myocardial Infarction; Peripheral Arterial Disease; Peripheral Venous Disease; Phlebitis; Vasculitis Brandy, [name] ([id number]) [id number] Page 10 of 11 Gastrointestinal Medical History: Negative for: Cirrhosis ; Colitis; Crohns; Hepatitis A; Hepatitis B; Hepatitis C Past Medical History Notes: diverticulosis hiatal hernia hyperlipidemia Endocrine Medical History: Negative for: Type I Diabetes; Type II Diabetes Genitourinary Medical History: Negative for: End Stage Renal Disease Immunological Medical History: Negative for: Lupus Erythematosus; Raynauds; Scleroderma Integumentary (Skin) Medical History: Negative for: History of Burn Musculoskeletal Medical History: Positive for: Osteoarthritis Negative for: Gout; Rheumatoid Arthritis; Osteomyelitis Past Medical History Notes: DJD Neurologic Medical History: Negative for: Dementia; Neuropathy; Quadriplegia; Paraplegia; Seizure Disorder Oncologic Medical History: Negative for: Received Chemotherapy Psychiatric Medical History: Negative for: Anorexia/bulimia; Confinement Anxiety HBO Extended History Items Eyes: Cataracts Immunizations Pneumococcal Vaccine: Received Pneumococcal Vaccination: Yes Received Pneumococcal Vaccination On or After 60th Birthday: Yes Implantable Devices None Hospitalization / Surgery History Type of Hospitalization/Surgery right total knee replacement Family and Social History Cancer: Yes - Siblings; Diabetes: Yes - Siblings; Heart Disease: Yes - Mother; Hereditary Spherocytosis: No; Hypertension: Yes - Mother; Kidney Disease: No; Lung Disease: No; Seizures: No; Stroke: No; Thyroid Problems: No; Tuberculosis: No; Never smoker; Marital Status -  Married; Alcohol Use: Never; Drug Use: No History; Caffeine Use: Moderate; Financial Concerns: No; Food, Clothing or Shelter Needs: No; Support System Lacking: No; Transportation Concerns: No Electronic Signature(s) Signed: 08/04/2022 11:13:24 AM By: Nat Christen ([id number]) [id number] Page 11 of 11 Entered By: Kalman Shan on 08/04/2022 09:07:16 -------------------------------------------------------------------------------- SuperBill Details Patient Name: Date of Service: [name], [place]  RO [name] 08/04/2022 Medical Record Number: [id number] Patient Account Number: [id number] Date of Birth/Sex: Treating RN: December 21, 1945 (76 y.o. F) Primary Care Provider: [name] Other Clinician: Referring Provider: Treating Provider/Extender: Hollie Beach in Treatment: 10 Diagnosis Coding ICD-10 Codes Code Description [phone] Non-pressure chronic ulcer of other part of left lower leg with fat layer exposed L97.812 Non-pressure chronic ulcer of other part of right lower leg with fat layer exposed I87.313 Chronic venous hypertension (idiopathic) with ulcer of bilateral lower extremity E11.622 Type 2 diabetes mellitus with other skin ulcer T79.8XXA Other early complications of trauma, initial encounter S81.801A Unspecified open wound, right lower leg, initial encounter S81.802A Unspecified open wound, left lower leg, initial encounter Facility Procedures : CPT4 Code: [id number] Description: [place] TISSUE 20 SQ CM/< ICD-10 Diagnosis Description L97.822 Non-pressure chronic ulcer of other part of left lower leg with fat layer expo L97.812 Non-pressure chronic ulcer of other part of right lower leg with fat layer exp Modifier: sed osed Quantity: 1 Physician Procedures : CPT4 Code Description Modifier [id number] - WC PHYS SUBQ TISS 20 SQ CM ICD-10 Diagnosis Description L97.822 Non-pressure chronic ulcer of other part  of left lower leg with fat layer exposed L97.812 Non-pressure chronic ulcer of other part of right  lower leg with fat layer exposed Quantity: 1 Electronic Signature(s) Signed: 08/04/2022 11:13:24 AM By: Kalman Shan DO Entered By: Kalman Shan on 08/04/2022 09:10:15

## 2022-08-05 DIAGNOSIS — M48062 Spinal stenosis, lumbar region with neurogenic claudication: Secondary | ICD-10-CM | POA: Diagnosis not present

## 2022-08-05 DIAGNOSIS — Z6839 Body mass index (BMI) 39.0-39.9, adult: Secondary | ICD-10-CM | POA: Diagnosis not present

## 2022-08-05 DIAGNOSIS — F1120 Opioid dependence, uncomplicated: Secondary | ICD-10-CM | POA: Diagnosis not present

## 2022-08-06 NOTE — Progress Notes (Signed)
MARGEE, TRENTHAM ([id number]) [id number] Page 1 of 9 Visit Report for 08/04/2022 Arrival Information Details Patient Name: Date of Service: [name] [name]. 08/04/2022 8:15 A M Medical Record Number: [id number] Patient Account Number: [id number] Date of Birth/Sex: Treating RN: December 01, 1945 (76 y.o. [name], [name] Primary Care Daunte Oestreich: [name] Other Clinician: Referring Amanuel Sinkfield: Treating Harbor Paster/Extender: Hollie Beach in Treatment: 10 Visit Information History Since Last Visit Added or deleted any medications: No Patient Arrived: [name] Any new allergies or adverse reactions: No Arrival Time: 08:23 Had a fall or experienced change in No Accompanied By: son activities of daily living that may affect Transfer Assistance: Manual risk of falls: Patient Identification Verified: Yes Signs or symptoms of abuse/neglect since last visito No Secondary Verification Process Completed: Yes Hospitalized since last visit: No Patient Requires Transmission-Based Precautions: No Implantable device outside of the clinic excluding No Patient Has Alerts: No cellular tissue based products placed in the center since last visit: Has Dressing in Place as Prescribed: Yes Has Compression in Place as Prescribed: Yes Pain Present Now: No Electronic Signature(s) Signed: 08/06/2022 4:05:37 PM By: Rhae Hammock RN Entered By: Rhae Hammock on 08/04/2022 08:24:23 -------------------------------------------------------------------------------- Encounter Discharge Information Details Patient Name: Date of Service: [name], Brandy RO [name]. 08/04/2022 8:15 A M Medical Record Number: [id number] Patient Account Number: [id number] Date of Birth/Sex: Treating RN: Sep 07, 1946 (76 y.o. Donalda Ewings Primary Care Farrin Shadle: [name] Other Clinician: Referring Donel Osowski: Treating Finlay Mills/Extender: Hollie Beach in  Treatment: 10 Encounter Discharge Information Items Post Procedure Vitals Discharge Condition: Stable Temperature (F): 98.5 Ambulatory Status: Walker Pulse (bpm): 75 Discharge Destination: Home Respiratory Rate (breaths/min): 18 Transportation: Private Auto Blood Pressure (mmHg): 162/78 Accompanied By: son Schedule Follow-up Appointment: Yes Clinical Summary of Care: Patient Declined Electronic Signature(s) Signed: 08/04/2022 5:18:48 PM By: Sharyn Creamer RN, BSN Entered By: Sharyn Creamer on 08/04/2022 09:08:20 [name] ([id number]) [id number] Page 2 of 9 -------------------------------------------------------------------------------- Lower Extremity Assessment Details Patient Name: Date of Service: [name] [name]. 08/04/2022 8:15 A M Medical Record Number: [id number] Patient Account Number: [id number] Date of Birth/Sex: Treating RN: 08-25-1946 (76 y.o. [name], [name] Primary Care Sache Sane: [name] Other Clinician: Referring Livi Mcgann: Treating Sayda Grable/Extender: Hollie Beach in Treatment: 10 Edema Assessment Assessed: [name]: No] Patrice Paradise: Yes] Edema: [Left: Yes] [Right: Yes] Calf Left: Right: Point of Measurement: 41 cm From Medial Instep 44 cm Ankle Left: Right: Point of Measurement: 8 cm From Medial Instep 27 cm Vascular Assessment Pulses: Dorsalis Pedis Palpable: [Right:Yes] Posterior Tibial Palpable: [Right:Yes] Electronic Signature(s) Signed: 08/06/2022 4:05:37 PM By: Rhae Hammock RN Entered By: Rhae Hammock on 08/04/2022 08:24:48 -------------------------------------------------------------------------------- Multi Wound Chart Details Patient Name: Date of Service: [name], Brandy RO [name]. 08/04/2022 8:15 A M Medical Record Number: [id number] Patient Account Number: [id number] Date of Birth/Sex: Treating RN: 09-26-1946 (76 y.o. F) Primary Care Gilbert Manolis: [name] Other  Clinician: Referring Ashia Dehner: Treating Rayshard Schirtzinger/Extender: Hollie Beach in Treatment: 10 Vital Signs Height(in): 63 Pulse(bpm): Weight(lbs): Blood Pressure(mmHg): Body Mass Index(BMI): Temperature(F): Respiratory Rate(breaths/min): 17 [2:Photos:] [N/A:N/A [id number] Page 3 of 9] Left, Lateral Upper Leg Right, Lateral Lower Leg N/A Wound Location: Trauma Trauma N/A Wounding Event: Abrasion Trauma, Other N/A Primary Etiology: Lymphedema N/A N/A Secondary Etiology: Cataracts, Anemia, Congestive Heart Cataracts, Anemia, Congestive Heart N/A Comorbid History: Failure, Hypertension, Osteoarthritis Failure, Hypertension, Osteoarthritis 06/20/2022 06/20/2022 N/A Date Acquired: 6 6 N/A Weeks of Treatment: Open Open N/A Wound Status: No  No N/A Wound Recurrence: 0.4x3.5x1.2 2.3x3.3x0.7 N/A Measurements L x W x D (cm) 1.1 5.961 N/A A (cm) : rea 1.319 4.173 N/A Volume (cm) : 91.50% 84.80% N/A % Reduction in A rea: 49.10% 46.90% N/A % Reduction in Volume: Full Thickness With Exposed Support Full Thickness With Exposed Support N/A Classification: Structures Structures Medium Medium N/A Exudate A mount: Serosanguineous Serosanguineous N/A Exudate Type: red, brown red, brown N/A Exudate Color: Distinct, outline attached Distinct, outline attached N/A Wound Margin: Large (67-100%) Large (67-100%) N/A Granulation A mount: Red, Pink Red, Pink, Hyper-granulation N/A Granulation Quality: Small (1-33%) Small (1-33%) N/A Necrotic A mount: Fat Layer (Subcutaneous Tissue): Yes Fat Layer (Subcutaneous Tissue): Yes N/A Exposed Structures: Fascia: No Fascia: No Tendon: No Tendon: No Muscle: No Muscle: No Joint: No Joint: No Bone: No Bone: No Medium (34-66%) Small (1-33%) N/A Epithelialization: Debridement - Excisional Debridement - Excisional N/A Debridement: Pre-procedure Verification/Time Out 08:43 08:43  N/A Taken: Lidocaine 4% Topical Solution Lidocaine 4% Topical Solution N/A Pain Control: Subcutaneous, Slough Subcutaneous, Slough N/A Tissue Debrided: Skin/Subcutaneous Tissue Skin/Subcutaneous Tissue N/A Level: 1.4 7.59 N/A Debridement A (sq cm): rea Curette Curette N/A Instrument: Minimum Minimum N/A Bleeding: Pressure Pressure N/A Hemostasis A chieved: 0 0 N/A Procedural Pain: 0 0 N/A Post Procedural Pain: Procedure was tolerated well Procedure was tolerated well N/A Debridement Treatment Response: 0.4x3.5x1.1 2.3x3.3x0.7 N/A Post Debridement Measurements L x W x D (cm) 1.21 4.173 N/A Post Debridement Volume: (cm) Excoriation: No Excoriation: No N/A Periwound Skin Texture: Induration: No Induration: No Callus: No Callus: No Crepitus: No Crepitus: No Rash: No Rash: No Scarring: No Scarring: No Maceration: No Maceration: No N/A Periwound Skin Moisture: Dry/Scaly: No Dry/Scaly: No Hemosiderin Staining: Yes Hemosiderin Staining: Yes N/A Periwound Skin Color: Atrophie Blanche: No Atrophie Blanche: No Cyanosis: No Cyanosis: No Ecchymosis: No Ecchymosis: No Erythema: No Erythema: No Mottled: No Mottled: No Pallor: No Pallor: No Rubor: No Rubor: No No Abnormality No Abnormality N/A Temperature: Debridement Debridement N/A Procedures Performed: Treatment Notes Electronic Signature(s) Signed: 08/04/2022 11:13:24 AM By: Kalman Shan DO Entered By: Kalman Shan on 08/04/2022 09:06:12 [name] ([id number]) [id number] Page 4 of 9 -------------------------------------------------------------------------------- Multi-Disciplinary Care Plan Details Patient Name: Date of Service: [name] [name]. 08/04/2022 8:15 A M Medical Record Number: [id number] Patient Account Number: [id number] Date of Birth/Sex: Treating RN: Feb 26, 1946 (76 y.o. Donalda Ewings Primary Care Jamond Neels: [name] Other  Clinician: Referring Kellon Chalk: Treating Tayo Maute/Extender: Hollie Beach in Treatment: 10 Active Inactive Pain, Acute or Chronic Nursing Diagnoses: Pain, acute or chronic: actual or potential Potential alteration in comfort, pain Goals: Patient will verbalize adequate pain control and receive pain control interventions during procedures as needed Date Initiated: 05/25/2022 Target Resolution Date: 08/14/2022 Goal Status: Active Patient/caregiver will verbalize comfort level met Date Initiated: 05/25/2022 Target Resolution Date: 08/14/2022 Goal Status: Active Interventions: Complete pain assessment as per visit requirements Encourage patient to take pain medications as prescribed Provide education on pain management Treatment Activities: Administer pain control measures as ordered : 05/25/2022 Notes: Venous Leg Ulcer Nursing Diagnoses: Knowledge deficit related to disease process and management Goals: Non-invasive venous studies are completed as ordered Date Initiated: 05/25/2022 Target Resolution Date: 08/14/2022 Goal Status: Active Interventions: Assess peripheral edema status every visit. Provide education on venous insufficiency Treatment Activities: Non-invasive vascular studies : 05/25/2022 Notes: Wound/Skin Impairment Nursing Diagnoses: Knowledge deficit related to ulceration/compromised skin integrity Goals: Patient/caregiver will verbalize understanding of skin care regimen Date Initiated: 05/25/2022 Target Resolution Date: 08/14/2022  Goal Status: Active Ulcer/skin breakdown will heal within 14 weeks Date Initiated: 05/25/2022 Target Resolution Date: 08/21/2022 Goal Status: Active Interventions: Assess patient/caregiver ability to obtain necessary supplies Assess patient/caregiver ability to perform ulcer/skin care regimen upon admission and as needed Provide education on ulcer and skin care Treatment Activities: Skin care regimen initiated :  05/25/2022 Topical wound management initiated : 05/25/2022 [name] ([id number]) [id number] Page 5 of 9 Notes: Electronic Signature(s) Signed: 08/04/2022 5:18:48 PM By: Sharyn Creamer RN, BSN Entered By: Sharyn Creamer on 08/04/2022 09:06:03 -------------------------------------------------------------------------------- Pain Assessment Details Patient Name: Date of Service: [name], Brandy RO [name]. 08/04/2022 8:15 A M Medical Record Number: [id number] Patient Account Number: [id number] Date of Birth/Sex: Treating RN: Jan 23, 1946 (76 y.o. [name], [name] Primary Care Javae Braaten: [name] Other Clinician: Referring Masae Lukacs: Treating Saraiyah Hemminger/Extender: Hollie Beach in Treatment: 10 Active Problems Location of Pain Severity and Description of Pain Patient Has Paino No Site Locations Pain Management and Medication Current Pain Management: Electronic Signature(s) Signed: 08/06/2022 4:05:37 PM By: Rhae Hammock RN Entered By: Rhae Hammock on 08/04/2022 08:24:38 -------------------------------------------------------------------------------- Patient/Caregiver Education Details Patient Name: Date of Service: [name] RO [name]. 10/17/2023andnbsp8:15 A M Medical Record Number: [id number] Patient Account Number: [id number] Date of Birth/Gender: Treating RN: 1946/01/29 (76 y.o. Donalda Ewings Primary Care Physician: [name] Other Clinician: Referring Physician: Treating Physician/Extender: Hollie Beach in Treatment: [address] SALVATRICE, MORANDI ([id number]) [id number] Page 6 of 9 Education Provided To: Patient Education Topics Provided Venous: Methods: Explain/Verbal Responses: State content correctly Wound/Skin Impairment: Methods: Explain/Verbal Responses: State content correctly Electronic Signature(s) Signed: 08/04/2022 5:18:48 PM By: Sharyn Creamer RN, BSN Entered By: Sharyn Creamer on 08/04/2022 09:06:23 -------------------------------------------------------------------------------- Wound Assessment Details Patient Name: Date of Service: [name], Brandy RO [name]. 08/04/2022 8:15 A M Medical Record Number: [id number] Patient Account Number: [id number] Date of Birth/Sex: Treating RN: 11/09/1945 (76 y.o. [name], [name] Primary Care Shanicqua Coldren: [name] Other Clinician: Referring Bayler Nehring: Treating Dallen Bunte/Extender: Hollie Beach in Treatment: 10 Wound Status Wound Number: 2 Primary Abrasion Etiology: Wound Location: Left, Lateral Upper Leg Secondary Lymphedema Wounding Event: Trauma Etiology: Date Acquired: 06/20/2022 Wound Status: Open Weeks Of Treatment: 6 Comorbid Cataracts, Anemia, Congestive Heart Failure, Hypertension, Clustered Wound: No History: Osteoarthritis Photos Wound Measurements Length: (cm) 0.4 Width: (cm) 3.5 Depth: (cm) 1.2 Area: (cm) 1.1 Volume: (cm) 1.319 % Reduction in Area: 91.5% % Reduction in Volume: 49.1% Epithelialization: Medium (34-66%) Tunneling: No Undermining: No Wound Description Classification: Full Thickness With Exposed Support Structures Wound Margin: Distinct, outline attached Exudate Amount: Medium Exudate Type: Serosanguineous Exudate Color: red, brown Foul Odor After Cleansing: No Slough/Fibrino Yes Wound Bed [name], [name] ([id number]) [id number] Page 7 of 9 Granulation Amount: Large (67-100%) Exposed Structure Granulation Quality: Red, Pink Fascia Exposed: No Necrotic Amount: Small (1-33%) Fat Layer (Subcutaneous Tissue) Exposed: Yes Necrotic Quality: Adherent Slough Tendon Exposed: No Muscle Exposed: No Joint Exposed: No Bone Exposed: No Periwound Skin Texture Texture Color No Abnormalities Noted: Yes No Abnormalities Noted: Yes Moisture Temperature / Pain No Abnormalities Noted: Yes Temperature:  No Abnormality Treatment Notes Wound #2 (Upper Leg) Wound Laterality: Left, Lateral Cleanser dakin's solution Discharge Instruction: clean with dakins Peri-Wound Care Topical Primary Dressing KerraCel Ag Gelling Fiber Dressing, 2x2 in (silver alginate) Discharge Instruction: Apply silver alginate to wound bed as instructed Secondary Dressing Woven Gauze Sponges 2x2 in Discharge Instruction: Apply over primary dressing as directed. Zetuvit Plus Silicone Border Dressing 7x7(in/in) Discharge  Instruction: Apply silicone border over primary dressing as directed. Secured With Compression Wrap Compression Stockings Environmental education officer) Signed: 08/04/2022 5:18:48 PM By: Sharyn Creamer RN, BSN Signed: 08/06/2022 4:05:37 PM By: Rhae Hammock RN Entered By: Sharyn Creamer on 08/04/2022 08:36:46 -------------------------------------------------------------------------------- Wound Assessment Details Patient Name: Date of Service: [name], Brandy RO [name]. 08/04/2022 8:15 A M Medical Record Number: [id number] Patient Account Number: [id number] Date of Birth/Sex: Treating RN: 21-Jul-1946 (76 y.o. [name], [name] Primary Care Dura Mccormack: [name] Other Clinician: Referring Halli Equihua: Treating Casha Estupinan/Extender: Hollie Beach in Treatment: 10 Wound Status Wound Number: 3 Primary Trauma, Other Etiology: Wound Location: Right, Lateral Lower Leg Wound Status: Open Wounding Event: Trauma Comorbid Cataracts, Anemia, Congestive Heart Failure, Hypertension, Date Acquired: 06/20/2022 History: Osteoarthritis Weeks Of Treatment: 6 Clustered Wound: No Photos [name], [name] ([id number]) [id number] Page 8 of 9 Wound Measurements Length: (cm) 2.3 Width: (cm) 3.3 Depth: (cm) 0.7 Area: (cm) 5.961 Volume: (cm) 4.173 % Reduction in Area: 84.8% % Reduction in Volume: 46.9% Epithelialization: Small (1-33%) Tunneling:  No Undermining: No Wound Description Classification: Full Thickness With Exposed Support Structures Wound Margin: Distinct, outline attached Exudate Amount: Medium Exudate Type: Serosanguineous Exudate Color: red, brown Foul Odor After Cleansing: No Slough/Fibrino Yes Wound Bed Granulation Amount: Large (67-100%) Exposed Structure Granulation Quality: Red, Pink, Hyper-granulation Fascia Exposed: No Necrotic Amount: Small (1-33%) Fat Layer (Subcutaneous Tissue) Exposed: Yes Necrotic Quality: Adherent Slough Tendon Exposed: No Muscle Exposed: No Joint Exposed: No Bone Exposed: No Periwound Skin Texture Texture Color No Abnormalities Noted: Yes No Abnormalities Noted: No Atrophie Blanche: No Moisture Cyanosis: No No Abnormalities Noted: Yes Ecchymosis: No Erythema: No Hemosiderin Staining: Yes Mottled: No Pallor: No Rubor: No Temperature / Pain Temperature: No Abnormality Treatment Notes Wound #3 (Lower Leg) Wound Laterality: Right, Lateral Cleanser Soap and Water Discharge Instruction: May shower and wash wound with dial antibacterial soap and water prior to dressing change. Peri-Wound Care Sween Lotion (Moisturizing lotion) Discharge Instruction: Apply moisturizing lotion as directed Topical Primary Dressing Hydrofera Blue Classic Foam, 4x4 in Discharge Instruction: Moisten with saline prior to applying to wound bed Santyl Ointment Discharge Instruction: Apply nickel thick amount to wound bed as instructed Secondary Dressing Woven Gauze Sponges 2x2 in Discharge Instruction: Apply over primary dressing as directed. [name], [name] ([id number]) [id number] Page 9 of 9 Zetuvit Plus 4x8 in Discharge Instruction: Apply over primary dressing as directed. Secured With Compression Wrap Kerlix Roll 4.5x3.1 (in/yd) Discharge Instruction: Apply Kerlix and Coban compression as directed. Coban Self-Adherent Wrap 4x5 (in/yd) Discharge  Instruction: Apply over Kerlix as directed. Compression Stockings Add-Ons Electronic Signature(s) Signed: 08/04/2022 5:18:48 PM By: Sharyn Creamer RN, BSN Signed: 08/06/2022 4:05:37 PM By: Rhae Hammock RN Entered By: Sharyn Creamer on 08/04/2022 08:37:47 -------------------------------------------------------------------------------- Vitals Details Patient Name: Date of Service: [name], Brandy RO [name]. 08/04/2022 8:15 A M Medical Record Number: [id number] Patient Account Number: [id number] Date of Birth/Sex: Treating RN: [birth year] (76 y.o. [name], [name] Primary Care Carloyn Lahue: [name] Other Clinician: Referring Hao Dion: Treating Laterrance Nauta/Extender: Hollie Beach in Treatment: 10 Vital Signs Time Taken: 08:24 Temperature (F): 98.5 Height (in): 63 Pulse (bpm): 75 Respiratory Rate (breaths/min): 18 Blood Pressure (mmHg): 162/78 Reference Range: 80 - 120 mg / dl Electronic Signature(s) Signed: 08/04/2022 5:18:48 PM By: Sharyn Creamer RN, BSN Entered By: Sharyn Creamer on 08/04/2022 09:08:02

## 2022-08-10 ENCOUNTER — Encounter (HOSPITAL_BASED_OUTPATIENT_CLINIC_OR_DEPARTMENT_OTHER): Payer: Medicare HMO | Admitting: Internal Medicine

## 2022-08-10 DIAGNOSIS — Z79899 Other long term (current) drug therapy: Secondary | ICD-10-CM | POA: Diagnosis not present

## 2022-08-10 DIAGNOSIS — E11622 Type 2 diabetes mellitus with other skin ulcer: Secondary | ICD-10-CM | POA: Diagnosis not present

## 2022-08-10 DIAGNOSIS — L97812 Non-pressure chronic ulcer of other part of right lower leg with fat layer exposed: Secondary | ICD-10-CM | POA: Diagnosis not present

## 2022-08-10 DIAGNOSIS — N183 Chronic kidney disease, stage 3 unspecified: Secondary | ICD-10-CM | POA: Diagnosis not present

## 2022-08-10 DIAGNOSIS — E1122 Type 2 diabetes mellitus with diabetic chronic kidney disease: Secondary | ICD-10-CM | POA: Diagnosis not present

## 2022-08-10 DIAGNOSIS — I87313 Chronic venous hypertension (idiopathic) with ulcer of bilateral lower extremity: Secondary | ICD-10-CM | POA: Diagnosis not present

## 2022-08-10 DIAGNOSIS — I872 Venous insufficiency (chronic) (peripheral): Secondary | ICD-10-CM | POA: Diagnosis not present

## 2022-08-10 DIAGNOSIS — L97822 Non-pressure chronic ulcer of other part of left lower leg with fat layer exposed: Secondary | ICD-10-CM | POA: Diagnosis not present

## 2022-08-13 NOTE — Progress Notes (Signed)
JANANI, CHAMBER ([id number]) [id number] Page 1 of 12 Visit Report for 08/10/2022 Arrival Information Details Patient Name: Date of Service: [name] 08/10/2022 8:15 A M Medical Record Number: [id number] Patient Account Number: [id number] Date of Birth/Sex: Treating RN: 1946-10-10 (76 y.o. [name], [name] Primary Care Nicey Krah: [name] Other Clinician: Referring Sean Macwilliams: Treating Kimi Bordeau/Extender: Hollie Beach in Treatment: 11 Visit Information History Since Last Visit Added or deleted any medications: No Patient Arrived: [name] Any new allergies or adverse reactions: No Arrival Time: 08:25 Had a fall or experienced change in No Accompanied By: son activities of daily living that may affect Transfer Assistance: None risk of falls: Patient Identification Verified: Yes Signs or symptoms of abuse/neglect since last visito No Secondary Verification Process Completed: Yes Hospitalized since last visit: No Patient Requires Transmission-Based Precautions: No Implantable device outside of the clinic excluding No Patient Has Alerts: No cellular tissue based products placed in the center since last visit: Has Dressing in Place as Prescribed: Yes Has Compression in Place as Prescribed: No Pain Present Now: No Notes per patient compression wrap fell down. Electronic Signature(s) Signed: 08/10/2022 5:18:37 PM By: [name] RN, BSN Entered By: [name] on 08/10/2022 08:35:01 -------------------------------------------------------------------------------- Clinic Level of Care Assessment Details Patient Name: Date of Service: [name] [name] 08/10/2022 8:15 A M Medical Record Number: [id number] Patient Account Number: [id number] Date of Birth/Sex: Treating RN: Jan 26, 1946 (76 y.o. [name], [name] Primary Care Justen Fonda: [name] Other Clinician: Referring Joany Khatib: Treating Claris Guymon/Extender:  Hollie Beach in Treatment: 11 Clinic Level of Care Assessment Items TOOL 4 Quantity Score X- 1 0 Use when only an EandM is performed on FOLLOW-UP visit ASSESSMENTS - Nursing Assessment / Reassessment X- 1 10 Reassessment of Co-morbidities (includes updates in patient status) X- 1 5 Reassessment of Adherence to Treatment Plan ASSESSMENTS - Wound and Skin A ssessment / Reassessment X - Simple Wound Assessment / Reassessment - one wound 1 5 _0  - 0 Complex Wound Assessment / Reassessment - multiple wounds _1  - 0 Dermatologic / Skin Assessment (not related to wound area) ASSESSMENTS - Focused Assessment DEBRAH, GRANDERSON B ([id number]) [id number].pdf Page 2 of 12 _2  - 0 Circumferential Edema Measurements - multi extremities _3  - 0 Nutritional Assessment / Counseling / Intervention _4  - 0 Lower Extremity Assessment (monofilament, tuning fork, pulses) _5  - 0 Peripheral Arterial Disease Assessment (using hand held doppler) ASSESSMENTS - Ostomy and/or Continence Assessment and Care _6  - 0 Incontinence Assessment and Management _7  - 0 Ostomy Care Assessment and Management (repouching, etc.) PROCESS - Coordination of Care X - Simple Patient / Family Education for ongoing care 1 15 _8  - 0 Complex (extensive) Patient / Family Education for ongoing care X- 1 10 Staff obtains Programmer, systems, Records, T Results / Process Orders est X- 1 10 Staff telephones HHA, Nursing Homes / Clarify orders / etc _9  - 0 Routine Transfer to another Facility (non-emergent condition) _10  - 0 Routine Hospital Admission (non-emergent condition) _11  - 0 New Admissions / Biomedical engineer / Ordering NPWT Apligraf, etc. , _12  - 0 Emergency Hospital Admission (emergent condition) X- 1 10 Simple Discharge Coordination _13  - 0 Complex (extensive) Discharge Coordination PROCESS - Special Needs _14  - 0 Pediatric / Minor Patient Management _15  - 0 Isolation Patient  Management _16  - 0 Hearing / Language / Visual special needs _17  - 0 Assessment of Community assistance (transportation, D/C planning, etc.) _18  - 0 Additional assistance / Altered mentation _19  -  0 Support Surface(s) Assessment (bed, cushion, seat, etc.) INTERVENTIONS - Wound Cleansing / Measurement X - Simple Wound Cleansing - one wound 1 5 _0  - 0 Complex Wound Cleansing - multiple wounds X- 1 5 Wound Imaging (photographs - any number of wounds) _1  - 0 Wound Tracing (instead of photographs) X- 1 5 Simple Wound Measurement - one wound _2  - 0 Complex Wound Measurement - multiple wounds INTERVENTIONS - Wound Dressings X - Small Wound Dressing one or multiple wounds 1 10 _3  - 0 Medium Wound Dressing one or multiple wounds _4  - 0 Large Wound Dressing one or multiple wounds X- 1 5 Application of Medications - topical <URKYHCWCBJSEGBTD>_1<\/VOHYWVPXTGGYIRSW>_5  - 0 Application of Medications - injection INTERVENTIONS - Miscellaneous _6  - 0 External ear exam _7  - 0 Specimen Collection (cultures, biopsies, blood, body fluids, etc.) _8  - 0 Specimen(s) / Culture(s) sent or taken to Lab for analysis _9  - 0 Patient Transfer (multiple staff / Harrel Lemon Lift / Similar devices) _10  - 0 Simple Staple / Suture removal (25 or less) VALENCIA, KASSA B ([id number]) [phone].pdf Page 3 of 12 _11  - 0 Complex Staple / Suture removal (26 or more) _12  - 0 Hypo / Hyperglycemic Management (close monitor of Blood Glucose) _13  - 0 Ankle / Brachial Index (ABI) - do not check if billed separately X- 1 5 Vital Signs Has the patient been seen at the hospital within the last three years: Yes Total Score: 100 Level Of Care: New/Established - Level 3 Electronic Signature(s) Signed: 08/13/2022 4:44:37 PM By: Rhae Hammock RN Entered By: Rhae Hammock on 08/10/2022 09:29:02 -------------------------------------------------------------------------------- Encounter Discharge Information Details Patient Name: Date of  Service: [name], [place] RO [name] 08/10/2022 8:15 A M Medical Record Number: [id number] Patient Account Number: [id number] Date of Birth/Sex: Treating RN: 07-01-1946 (76 y.o. [name], [name] Primary Care Deshaun Schou: [name] Other Clinician: Referring Soyla Bainter: Treating Christain Mcraney/Extender: Hollie Beach in Treatment: 11 Encounter Discharge Information Items Post Procedure Vitals Discharge Condition: Stable Temperature (F): 98.2 Ambulatory Status: Wheelchair Pulse (bpm): 76 Discharge Destination: Home Respiratory Rate (breaths/min): 17 Transportation: Private Auto Blood Pressure (mmHg): 139/84 Accompanied By: self Schedule Follow-up Appointment: Yes Clinical Summary of Care: Patient Declined Electronic Signature(s) Signed: 08/13/2022 4:44:37 PM By: Rhae Hammock RN Entered By: Rhae Hammock on 08/10/2022 09:18:10 -------------------------------------------------------------------------------- Lower Extremity Assessment Details Patient Name: Date of Service: [name] RO [name] 08/10/2022 8:15 A M Medical Record Number: [id number] Patient Account Number: [id number] Date of Birth/Sex: Treating RN: June 24, 1946 (76 y.o. [name], [name] Primary Care Alvie Speltz: [name] Other Clinician: Referring Delos Klich: Treating Kristion Holifield/Extender: Hollie Beach in Treatment: 11 Edema Assessment Assessed: [name]: Yes] Patrice Paradise: Yes] Edema: [Left: Yes] [Right: Yes] Calf Left: Right: Point of Measurement: 41 cm From Medial Instep 44 cm Ankle Left: Right: Point of Measurement: 8 cm From Medial Instep 27 cm SHAMBRIA, CAMERER B ([id number]) [phone].pdf Page 4 of 12 Vascular Assessment Pulses: Dorsalis Pedis Palpable: [Right:Yes] Electronic Signature(s) Signed: 08/10/2022 5:18:37 PM By: [name] RN, BSN Entered By: [name] on 08/10/2022  08:35:33 -------------------------------------------------------------------------------- Multi Wound Chart Details Patient Name: Date of Service: [name], [place] RO [name] 08/10/2022 8:15 A M Medical Record Number: [id number] Patient Account Number: [id number] Date of Birth/Sex: Treating RN: 1945-12-01 (76 y.o. F) Primary Care Zimere Dunlevy: [name] Other Clinician: Referring Nayquan Evinger: Treating Tzipora Mcinroy/Extender: Hollie Beach in Treatment: 11 Vital Signs Height(in): 63 Pulse(bpm): 83 Weight(lbs): Blood Pressure(mmHg): 133/88 Body Mass Index(BMI): Temperature(F): 98.8 Respiratory Rate(breaths/min): 20 [2:Photos:] [N/A:N/A]  Left, Lateral Upper Leg Right, Lateral Lower Leg N/A Wound Location: Trauma Trauma N/A Wounding Event: Abrasion Trauma, Other N/A Primary Etiology: Lymphedema N/A N/A Secondary Etiology: Cataracts, Anemia, Congestive Heart Cataracts, Anemia, Congestive Heart N/A Comorbid History: Failure, Hypertension, Osteoarthritis Failure, Hypertension, Osteoarthritis 06/20/2022 06/20/2022 N/A Date Acquired: 6 6 N/A Weeks of Treatment: Open Open N/A Wound Status: No No N/A Wound Recurrence: 0.3x3.4x1.3 2.4x3x0.5 N/A Measurements L x W x D (cm) 0.801 5.655 N/A A (cm) : rea 1.041 2.827 N/A Volume (cm) : 93.80% 85.60% N/A % Reduction in Area: 59.80% 64.00% N/A % Reduction in Volume: Full Thickness With Exposed Support Full Thickness With Exposed Support N/A Classification: Structures Structures Medium Medium N/A Exudate Amount: Serosanguineous Serosanguineous N/A Exudate Type: red, brown red, brown N/A Exudate Color: Distinct, outline attached Distinct, outline attached N/A Wound Margin: Large (67-100%) Large (67-100%) N/A Granulation Amount: Red, Pink Red, Pink, Hyper-granulation N/A Granulation Quality: None Present (0%) Small (1-33%) N/A Necrotic Amount: Fat Layer (Subcutaneous Tissue): Yes Fat Layer (Subcutaneous Tissue): Yes  N/A Exposed Structures: Fascia: No Fascia: No Tendon: No Tendon: No Muscle: No Muscle: No Joint: No Joint: No Bone: No Bone: No Medium (34-66%) Small (1-33%) N/A EpithelializationTASHYRA, ADDUCI B ([id number]) [phone].pdf Page 5 of 12 N/A Debridement - Excisional N/A Debridement: N/A 09:15 N/A Pre-procedure Verification/Time Out Taken: N/A Lidocaine N/A Pain Control: N/A Subcutaneous, Slough N/A Tissue Debrided: N/A Skin/Subcutaneous Tissue N/A Level: N/A 7.2 N/A Debridement A (sq cm): rea N/A Curette N/A Instrument: N/A Minimum N/A Bleeding: N/A Pressure N/A Hemostasis A chieved: N/A 0 N/A Procedural Pain: N/A 0 N/A Post Procedural Pain: N/A Procedure was tolerated well N/A Debridement Treatment Response: N/A 2.4x3x0.5 N/A Post Debridement Measurements L x W x D (cm) N/A 2.827 N/A Post Debridement Volume: (cm) Excoriation: No Excoriation: No N/A Periwound Skin Texture: Induration: No Induration: No Callus: No Callus: No Crepitus: No Crepitus: No Rash: No Rash: No Scarring: No Scarring: No Maceration: No Maceration: No N/A Periwound Skin Moisture: Dry/Scaly: No Dry/Scaly: No Hemosiderin Staining: Yes Hemosiderin Staining: Yes N/A Periwound Skin Color: Atrophie Blanche: No Atrophie Blanche: No Cyanosis: No Cyanosis: No Ecchymosis: No Ecchymosis: No Erythema: No Erythema: No Mottled: No Mottled: No Pallor: No Pallor: No Rubor: No Rubor: No No Abnormality No Abnormality N/A Temperature: N/A Debridement N/A Procedures Performed: Treatment Notes Wound #2 (Upper Leg) Wound Laterality: Left, Lateral Cleanser dakin's solution Discharge Instruction: clean with dakins Peri-Wound Care Topical Primary Dressing KerraCel Ag Gelling Fiber Dressing, 2x2 in (silver alginate) Discharge Instruction: Apply silver alginate to wound bed as instructed MediHoney Gel, tube 1.5 (oz) Discharge Instruction: Apply to wound  bed as instructed Secondary Dressing Woven Gauze Sponges 2x2 in Discharge Instruction: Apply over primary dressing as directed. Zetuvit Plus Silicone Border Dressing 7x7(in/in) Discharge Instruction: Apply silicone border over primary dressing as directed. Secured With Compression Wrap Compression Stockings Add-Ons Wound #3 (Lower Leg) Wound Laterality: Right, Lateral Cleanser Soap and Water Discharge Instruction: May shower and wash wound with dial antibacterial soap and water prior to dressing change. Peri-Wound Care Sween Lotion (Moisturizing lotion) Discharge Instruction: Apply moisturizing lotion as directed Topical Primary Dressing RUFINA, KIMERY ([id number]) [phone].pdf Page 6 of 12 Hydrofera Blue Classic Foam, 4x4 in Discharge Instruction: Moisten with saline prior to applying to wound bed Santyl Ointment Discharge Instruction: Apply nickel thick amount to wound bed as instructed Secondary Dressing Woven Gauze Sponges 2x2 in Discharge Instruction: Apply over primary dressing as directed. Zetuvit Plus 4x8 in Discharge Instruction: Apply over primary dressing as directed.  Secured With Compression Wrap Kerlix Roll 4.5x3.1 (in/yd) Discharge Instruction: Apply Kerlix and Coban compression as directed. Coban Self-Adherent Wrap 4x5 (in/yd) Discharge Instruction: Apply over Kerlix as directed. Compression Stockings Add-Ons Electronic Signature(s) Signed: 08/10/2022 11:26:43 AM By: Kalman Shan DO Entered By: Kalman Shan on 08/10/2022 09:43:52 -------------------------------------------------------------------------------- Multi-Disciplinary Care Plan Details Patient Name: Date of Service: [name], [place] RO [name] 08/10/2022 8:15 A M Medical Record Number: [id number] Patient Account Number: [id number] Date of Birth/Sex: Treating RN: 14-May-1946 (76 y.o. [name], [name] Primary Care Karlita Lichtman: [name] Other Clinician: Referring  Louden Houseworth: Treating Anzley Dibbern/Extender: Hollie Beach in Treatment: 11 Active Inactive Pain, Acute or Chronic Nursing Diagnoses: Pain, acute or chronic: actual or potential Potential alteration in comfort, pain Goals: Patient will verbalize adequate pain control and receive pain control interventions during procedures as needed Date Initiated: 05/25/2022 Target Resolution Date: 08/14/2022 Goal Status: Active Patient/caregiver will verbalize comfort level met Date Initiated: 05/25/2022 Target Resolution Date: 08/14/2022 Goal Status: Active Interventions: Complete pain assessment as per visit requirements Encourage patient to take pain medications as prescribed Provide education on pain management Treatment Activities: Administer pain control measures as ordered : 05/25/2022 Notes: Venous Leg Ulcer OYINKANSOLA, TRUAX B ([id number]) [phone].pdf Page 7 of 12 Nursing Diagnoses: Knowledge deficit related to disease process and management Goals: Non-invasive venous studies are completed as ordered Date Initiated: 05/25/2022 Target Resolution Date: 08/14/2022 Goal Status: Active Interventions: Assess peripheral edema status every visit. Provide education on venous insufficiency Treatment Activities: Non-invasive vascular studies : 05/25/2022 Notes: Wound/Skin Impairment Nursing Diagnoses: Knowledge deficit related to ulceration/compromised skin integrity Goals: Patient/caregiver will verbalize understanding of skin care regimen Date Initiated: 05/25/2022 Target Resolution Date: 08/14/2022 Goal Status: Active Ulcer/skin breakdown will heal within 14 weeks Date Initiated: 05/25/2022 Target Resolution Date: 08/21/2022 Goal Status: Active Interventions: Assess patient/caregiver ability to obtain necessary supplies Assess patient/caregiver ability to perform ulcer/skin care regimen upon admission and as needed Provide education on ulcer and skin  care Treatment Activities: Skin care regimen initiated : 05/25/2022 Topical wound management initiated : 05/25/2022 Notes: Electronic Signature(s) Signed: 08/10/2022 5:18:37 PM By: [name] RN, BSN Entered By: [name] on 08/10/2022 09:06:16 -------------------------------------------------------------------------------- Pain Assessment Details Patient Name: Date of Service: [name], [place] RO [name] 08/10/2022 8:15 A M Medical Record Number: [id number] Patient Account Number: [id number] Date of Birth/Sex: Treating RN: 1946/04/24 (76 y.o. [name] Primary Care Albeiro Trompeter: [name] Other Clinician: Referring Jamiria Langill: Treating Wyley Hack/Extender: Hollie Beach in Treatment: 11 Active Problems Location of Pain Severity and Description of Pain Patient Has Paino No Site Locations Rate the pain. MICAIAH, REMILLARD ([id number]) [id number] Page 8 of 12 Rate the pain. Current Pain Level: 0 Pain Management and Medication Current Pain Management: Medication: No Cold Application: No Rest: No Massage: No Activity: No T.E.N.S.: No Heat Application: No Leg drop or elevation: No Is the Current Pain Management Adequate: Adequate How does your wound impact your activities of daily livingo Sleep: No Bathing: No Appetite: No Relationship With Others: No Bladder Continence: No Emotions: No Bowel Continence: No Work: No Toileting: No Drive: No Dressing: No Hobbies: No Engineer, maintenance) Signed: 08/10/2022 5:18:37 PM By: [name] RN, BSN Entered By: [name] on 08/10/2022 08:35:22 -------------------------------------------------------------------------------- Patient/Caregiver Education Details Patient Name: Date of Service: [name] RO [name] 10/23/2023andnbsp8:15 A M Medical Record Number: [id number] Patient Account Number: [id number] Date of Birth/Gender: Treating RN: [birth year] (76 y.o. [name] Primary Care Physician: [name] Other Clinician: Referring  Physician: Treating Physician/Extender: Hollie Beach in Treatment: 11 Education Assessment Education Provided To: Patient Education Topics Provided Wound/Skin Impairment: Handouts: Skin Care Do's and Dont's Methods: Explain/Verbal Responses: Reinforcements needed Electronic Signature(s) Signed: 08/10/2022 5:18:37 PM By: [name] RN, BSN Entered By: [name] on 08/10/2022 09:06:27 Thomasene Mohair ([id number]) [id number] Page 9 of 12 -------------------------------------------------------------------------------- Wound Assessment Details Patient Name: Date of Service: [name] [name] 08/10/2022 8:15 A M Medical Record Number: [id number] Patient Account Number: [id number] Date of Birth/Sex: Treating RN: Mar 04, 1946 (76 y.o. [name] Primary Care Brenisha Tsui: [name] Other Clinician: Referring Jeena Arnett: Treating Caileen Veracruz/Extender: Hollie Beach in Treatment: 11 Wound Status Wound Number: 2 Primary Abrasion Etiology: Wound Location: Left, Lateral Upper Leg Secondary Lymphedema Wounding Event: Trauma Etiology: Date Acquired: 06/20/2022 Wound Status: Open Weeks Of Treatment: 6 Comorbid Cataracts, Anemia, Congestive Heart Failure, Hypertension, Clustered Wound: No History: Osteoarthritis Photos Wound Measurements Length: (cm) 0 Width: (cm) 3 Depth: (cm) 1 Area: (cm) Volume: (cm) .3 % Reduction in Area: 93.8% .4 % Reduction in Volume: 59.8% .3 Epithelialization: Medium (34-66%) 0.801 Tunneling: No 1.041 Undermining: No Wound Description Classification: Full Thickness With Exposed Suppor Wound Margin: Distinct, outline attached Exudate Amount: Medium Exudate Type: Serosanguineous Exudate Color: red, brown t Structures Foul Odor After Cleansing: No Slough/Fibrino No Wound Bed Granulation Amount: Large  (67-100%) Exposed Structure Granulation Quality: Red, Pink Fascia Exposed: No Necrotic Amount: None Present (0%) Fat Layer (Subcutaneous Tissue) Exposed: Yes Tendon Exposed: No Muscle Exposed: No Joint Exposed: No Bone Exposed: No Periwound Skin Texture Texture Color No Abnormalities Noted: Yes No Abnormalities Noted: Yes Moisture Temperature / Pain No Abnormalities Noted: Yes Temperature: No Abnormality Treatment Notes Wound #2 (Upper Leg) Wound Laterality: Left, Lateral Cleanser dakin's solution SEELEY, SOUTHGATE B ([id number]) [phone].pdf Page 10 of 12 Discharge Instruction: clean with dakins Peri-Wound Care Topical Primary Dressing KerraCel Ag Gelling Fiber Dressing, 2x2 in (silver alginate) Discharge Instruction: Apply silver alginate to wound bed as instructed MediHoney Gel, tube 1.5 (oz) Discharge Instruction: Apply to wound bed as instructed Secondary Dressing Woven Gauze Sponges 2x2 in Discharge Instruction: Apply over primary dressing as directed. Zetuvit Plus Silicone Border Dressing 7x7(in/in) Discharge Instruction: Apply silicone border over primary dressing as directed. Secured With Compression Wrap Compression Stockings Environmental education officer) Signed: 08/10/2022 5:18:37 PM By: [name] RN, BSN Entered By: [name] on 08/10/2022 08:36:24 -------------------------------------------------------------------------------- Wound Assessment Details Patient Name: Date of Service: [name] RO [name] 08/10/2022 8:15 A M Medical Record Number: [id number] Patient Account Number: [id number] Date of Birth/Sex: Treating RN: Oct 25, 1945 (76 y.o. [name], [name] Primary Care Phelan Goers: [name] Other Clinician: Referring Livy Ross: Treating Griffith Santilli/Extender: Hollie Beach in Treatment: 11 Wound Status Wound Number: 3 Primary Trauma, Other Etiology: Wound Location: Right, Lateral Lower Leg Wound  Status: Open Wounding Event: Trauma Comorbid Cataracts, Anemia, Congestive Heart Failure, Hypertension, Date Acquired: 06/20/2022 History: Osteoarthritis Weeks Of Treatment: 6 Clustered Wound: No Photos Wound Measurements Length: (cm) 2.4 Width: (cm) 3 Depth: (cm) 0.5 Area: (cm) 5.655 Volume: (cm) 2.827 Kichline, Khloei B ([id number]) % Reduction in Area: 85.6% % Reduction in Volume: 64% Epithelialization: Small (1-33%) Tunneling: No Undermining: No [phone].pdf Page 11 of 12 Wound Description Classification: Full Thickness With Exposed Suppor Wound Margin: Distinct, outline attached Exudate Amount: Medium Exudate Type: Serosanguineous Exudate Color: red, brown t Structures Foul Odor After Cleansing: No Slough/Fibrino Yes Wound Bed Granulation Amount: Large (67-100%) Exposed Structure Granulation Quality: Red, Pink, Hyper-granulation Fascia  Exposed: No Necrotic Amount: Small (1-33%) Fat Layer (Subcutaneous Tissue) Exposed: Yes Necrotic Quality: Adherent Slough Tendon Exposed: No Muscle Exposed: No Joint Exposed: No Bone Exposed: No Periwound Skin Texture Texture Color No Abnormalities Noted: Yes No Abnormalities Noted: No Atrophie Blanche: No Moisture Cyanosis: No No Abnormalities Noted: Yes Ecchymosis: No Erythema: No Hemosiderin Staining: Yes Mottled: No Pallor: No Rubor: No Temperature / Pain Temperature: No Abnormality Treatment Notes Wound #3 (Lower Leg) Wound Laterality: Right, Lateral Cleanser Soap and Water Discharge Instruction: May shower and wash wound with dial antibacterial soap and water prior to dressing change. Peri-Wound Care Sween Lotion (Moisturizing lotion) Discharge Instruction: Apply moisturizing lotion as directed Topical Primary Dressing Hydrofera Blue Classic Foam, 4x4 in Discharge Instruction: Moisten with saline prior to applying to wound bed Santyl Ointment Discharge Instruction: Apply nickel thick  amount to wound bed as instructed Secondary Dressing Woven Gauze Sponges 2x2 in Discharge Instruction: Apply over primary dressing as directed. Zetuvit Plus 4x8 in Discharge Instruction: Apply over primary dressing as directed. Secured With Compression Wrap Kerlix Roll 4.5x3.1 (in/yd) Discharge Instruction: Apply Kerlix and Coban compression as directed. Coban Self-Adherent Wrap 4x5 (in/yd) Discharge Instruction: Apply over Kerlix as directed. Compression Stockings Add-Ons Electronic Signature(s) Signed: 08/10/2022 5:18:37 PM By: [name] RN, BSN Entered By: [name] on 08/10/2022 08:36:40 Thomasene Mohair ([id number]) [id number] Page 12 of 12 -------------------------------------------------------------------------------- Vitals Details Patient Name: Date of Service: [name] [name] 08/10/2022 8:15 A M Medical Record Number: [id number] Patient Account Number: [id number] Date of Birth/Sex: Treating RN: 1946/02/21 (76 y.o. [name], [name] Primary Care Marine Lezotte: [name] Other Clinician: Referring Mercedies Ganesh: Treating Eliseo Withers/Extender: Hollie Beach in Treatment: 11 Vital Signs Time Taken: 08:25 Temperature (F): 98.8 Height (in): 63 Pulse (bpm): 83 Respiratory Rate (breaths/min): 20 Blood Pressure (mmHg): 133/88 Reference Range: 80 - 120 mg / dl Electronic Signature(s) Signed: 08/10/2022 5:18:37 PM By: [name] RN, BSN Entered By: [name] on 08/10/2022 08:35:12

## 2022-08-13 NOTE — Progress Notes (Signed)
SIANNI, CLONINGER ([id number]) [id number] Page 1 of 10 Visit Report for 08/10/2022 Chief Complaint Document Details Patient Name: Date of Service: [name] [name] 08/10/2022 8:15 A M Medical Record Number: [id number] Patient Account Number: [id number] Date of Birth/Sex: Treating RN: 1946/02/27 (76 y.o. F) Primary Care Provider: [name] Other Clinician: Referring Provider: Treating Provider/Extender: Hollie Beach in Treatment: 11 Information Obtained from: Patient Chief Complaint 05/25/2022; left lower extremity wound following a dog scratch 06/23/2022; lacerations to the right and left lower extremity status post fall Electronic Signature(s) Signed: 08/10/2022 11:26:43 AM By: Kalman Shan DO Entered By: Kalman Shan on 08/10/2022 09:44:03 -------------------------------------------------------------------------------- Debridement Details Patient Name: Date of Service: [name], [place] RO [name] 08/10/2022 8:15 A M Medical Record Number: [id number] Patient Account Number: [id number] Date of Birth/Sex: Treating RN: 06-13-1946 (76 y.o. [name], [name] Primary Care Provider: [name] Other Clinician: Referring Provider: Treating Provider/Extender: Hollie Beach in Treatment: 11 Debridement Performed for Assessment: Wound #3 Right,Lateral Lower Leg Performed By: Physician Kalman Shan, DO Debridement Type: Debridement Level of Consciousness (Pre-procedure): Awake and Alert Pre-procedure Verification/Time Out Yes - 09:15 Taken: Start Time: 09:15 Pain Control: Lidocaine T Area Debrided (L x W): otal 2.4 (cm) x 3 (cm) = 7.2 (cm) Tissue and other material debrided: Viable, Non-Viable, Slough, Subcutaneous, Slough Level: Skin/Subcutaneous Tissue Debridement Description: Excisional Instrument: Curette Bleeding: Minimum Hemostasis Achieved: Pressure End Time: 09:15 Procedural Pain: 0 Post  Procedural Pain: 0 Response to Treatment: Procedure was tolerated well Level of Consciousness (Post- Awake and Alert procedure): Post Debridement Measurements of Total Wound Length: (cm) 2.4 Width: (cm) 3 Depth: (cm) 0.5 Volume: (cm) 2.827 Character of Wound/Ulcer Post Debridement: Improved VEONA, BITTMAN B ([id number]) [id number].pdf Page 2 of 10 Post Procedure Diagnosis Same as Pre-procedure Electronic Signature(s) Signed: 08/10/2022 11:26:43 AM By: Kalman Shan DO Signed: 08/13/2022 4:44:37 PM By: Rhae Hammock RN Entered By: Rhae Hammock on 08/10/2022 09:17:19 -------------------------------------------------------------------------------- HPI Details Patient Name: Date of Service: [name], [place] RO [name] 08/10/2022 8:15 A M Medical Record Number: [id number] Patient Account Number: [id number] Date of Birth/Sex: Treating RN: 05-08-1946 (76 y.o. F) Primary Care Provider: [name] Other Clinician: Referring Provider: Treating Provider/Extender: Hollie Beach in Treatment: 11 History of Present Illness HPI Description: Admission 05/25/2022 Ms. Laquenta Whitsell is a 76 year old female with a past medical history of diet-controlled type 2 diabetes, chronic 3 stage kidney disease and venous insufficiency that presents to the clinic for a 1 month history of nonhealing ulcer to the left leg. She states that her dog scratched her causing a wound. She has developed cellulitis in this leg and has been treated with clindamycin by her primary care physician. She reports improvement in her symptoms. She currently denies signs of infection. She has been keeping the area covered. She does not wear compression stockings. She is on 80 mg of Lasix twice daily. She has had reflux studies to the left leg on 07/2021 that notes venous reflux throughout the left common femoral vein and greater saphenous vein. She has not had an ablation. She  follows with vein and vascular for her venous insufficiency. 8/14; patient presents for follow-up. She states that the wrap stayed on for 4 days and eventually slid down. She has been wearing her compression stocking and doing dressing changes with Hydrofera Blue since. She also states she has a hard time putting on her shoes with the 3 layer compression. She denies signs of infection. 8/21; patient  presents for follow-up. She again had trouble with the wrap sliding down. We have been using Hydrofera Blue with gentamicin under 2 layer Coflex. She currently denies signs of infection. 8/28; patient presents for follow-up. She reports taking the wrap off yesterday. We have been using Hydrofera Blue with gentamicin under 2 layer Coflex. She states that the wrap does slide down but remains over the wound. She is currently wearing her compression stocking. She would like to do this instead of the compression wrap. 9/5; patient presents for follow-up. She has been using Medihoney to the left lower extremity remedy wound with her compression stockings. She reports no drainage from the site. Unfortunately she fell over the weekend and had to go to the ED due to lacerations she experienced on her left and right lower extremity. On the left leg she had 10 sutures placed on the right leg she had 7. She was given Keflex. Today she reports no signs of infection. She has been using Xeroform to the suture sites. 9/11; patient presents for follow-up. She has been using bacitracin ointment to the suture line. She currently denies signs of infection. 9/19; patient presents for follow-up. She has been using Medihoney to the wound beds. She denies signs of infection. 9/26; patient presents for follow-up. She has been using Dakin's wet-to-dry dressings to the wound beds. She reports improvement in wound healing. She has no issues or complaints today. 10/3; patient presents for follow-up. We had used compression therapy  along with Hydrofera Blue to the right lower extremity. She states she felt uncomfortable with the wrap and took it off 3 days ago. She has been using Medihoney without issues to the left lower extremity wound. She denies signs of infection. 10/10; patient presents for follow-up. She has been using Hydrofera Blue and Medihoney to the left lower extremity wound. We have been using Hydrofera Blue and Santyl under compression therapy to the right lower extremity. She states that the wrap slid down 2 days ago and she took it off. She has no issues or complaints today. She denies signs of infection. 10/17; patient presents for follow-up. We have been using Hydrofera Blue to the left lower extremity wounds and Hydrofera Blue and Santyl under compression therapy to the right lower extremity. She has no issues or complaints today. She denies signs of infection. 10/23; patient presents for follow-up. We have been using silver alginate to the left lower extremity and Hydrofera Blue and Santyl under Kerlix/Coban to the right lower extremity. It is unclear which she is using to the left leg. She denies signs of infection. She has no issues or complaints today. Electronic Signature(s) Signed: 08/10/2022 11:26:43 AM By: Kalman Shan DO Entered By: Kalman Shan on 08/10/2022 09:44:47 Thomasene Mohair ([id number]) [id number] Page 3 of 10 -------------------------------------------------------------------------------- Physical Exam Details Patient Name: Date of Service: [name] [name] 08/10/2022 8:15 A M Medical Record Number: [id number] Patient Account Number: [id number] Date of Birth/Sex: Treating RN: 09-18-1946 (76 y.o. F) Primary Care Provider: [name] Other Clinician: Referring Provider: Treating Provider/Extender: Hollie Beach in Treatment: 11 Constitutional respirations regular, non-labored and within target range for  patient.. Cardiovascular 2+ dorsalis pedis/posterior tibialis pulses. Psychiatric pleasant and cooperative. Notes : Right lower extremity: Nonviable tissue and granulation tissue in the wound bed. 2+ pitting edema to the knee. Left lower extremity: 3 small narrow openings with increased depth of about 1 to 2 cm. No signs of infection. Electronic Signature(s) Signed: 08/10/2022 11:26:43 AM By:  Kalman Shan DO Entered By: Kalman Shan on 08/10/2022 09:45:39 -------------------------------------------------------------------------------- Physician Orders Details Patient Name: Date of Service: [name] [name] 08/10/2022 8:15 A M Medical Record Number: [id number] Patient Account Number: [id number] Date of Birth/Sex: Treating RN: [birth year] (76 y.o. [name], [name] Primary Care Provider: [name] Other Clinician: Referring Provider: Treating Provider/Extender: Hollie Beach in Treatment: 11 Verbal / Phone Orders: No Diagnosis Coding ICD-10 Coding Code Description [phone] Non-pressure chronic ulcer of other part of left lower leg with fat layer exposed L97.812 Non-pressure chronic ulcer of other part of right lower leg with fat layer exposed I87.313 Chronic venous hypertension (idiopathic) with ulcer of bilateral lower extremity E11.622 Type 2 diabetes mellitus with other skin ulcer T79.8XXA Other early complications of trauma, initial encounter S81.801A Unspecified open wound, right lower leg, initial encounter S81.802A Unspecified open wound, left lower leg, initial encounter Follow-up Appointments ppointment in 1 week. - Dr. Heber [place] and [place], Room 8 Tuesday Return A ppointment in 2 weeks. - Dr. Heber [place] and [place], Room 8 Tuesday Return A Anesthetic (In clinic) Topical Lidocaine 5% applied to wound bed Bathing/ Shower/ Hygiene May shower with protection but do not get wound dressing(s) wet. Edema Control - Lymphedema / SCD /  Other JERICHO, CIESLIK B ([id number]) [id number] Page 4 of 10 Elevate legs to the level of the heart or above for 30 minutes daily and/or when sitting, a frequency of: - 2-3 times a day throughout the day. Avoid standing for long periods of time. Exercise regularly Moisturize legs daily. - lotion both legs every night before bed. Compression stocking or Garment 20-30 mm/Hg pressure to: - apply in the morning and remove at night. Wound Treatment Wound #2 - Upper Leg Wound Laterality: Left, Lateral Cleanser: dakin's solution Every Other Day/30 Days Discharge Instructions: clean with dakins Prim Dressing: KerraCel Ag Gelling Fiber Dressing, 2x2 in (silver alginate) (Generic) Every Other Day/30 Days ary Discharge Instructions: Apply silver alginate to wound bed as instructed Prim Dressing: MediHoney Gel, tube 1.5 (oz) Every Other Day/30 Days ary Discharge Instructions: Apply to wound bed as instructed Secondary Dressing: Woven Gauze Sponges 2x2 in Every Other Day/30 Days Discharge Instructions: Apply over primary dressing as directed. Secondary Dressing: Zetuvit Plus Silicone Border Dressing 7x7(in/in) Every Other Day/30 Days Discharge Instructions: Apply silicone border over primary dressing as directed. Wound #3 - Lower Leg Wound Laterality: Right, Lateral Cleanser: Soap and Water 1 x Per Week/30 Days Discharge Instructions: May shower and wash wound with dial antibacterial soap and water prior to dressing change. Peri-Wound Care: Sween Lotion (Moisturizing lotion) 1 x Per Week/30 Days Discharge Instructions: Apply moisturizing lotion as directed Prim Dressing: Hydrofera Blue Classic Foam, 4x4 in 1 x Per Week/30 Days ary Discharge Instructions: Moisten with saline prior to applying to wound bed Prim Dressing: Santyl Ointment 1 x Per Week/30 Days ary Discharge Instructions: Apply nickel thick amount to wound bed as instructed Secondary Dressing: Woven Gauze  Sponges 2x2 in 1 x Per Week/30 Days Discharge Instructions: Apply over primary dressing as directed. Secondary Dressing: Zetuvit Plus 4x8 in 1 x Per Week/30 Days Discharge Instructions: Apply over primary dressing as directed. Compression Wrap: Kerlix Roll 4.5x3.1 (in/yd) 1 x Per Week/30 Days Discharge Instructions: Apply Kerlix and Coban compression as directed. Compression Wrap: Coban Self-Adherent Wrap 4x5 (in/yd) 1 x Per Week/30 Days Discharge Instructions: Apply over Kerlix as directed. Electronic Signature(s) Signed: 08/10/2022 11:26:43 AM By: Kalman Shan DO Entered By: Kalman Shan on 08/10/2022 09:45:48 -------------------------------------------------------------------------------- Problem List  Details Patient Name: Date of Service: [name] 08/10/2022 8:15 A M Medical Record Number: [id number] Patient Account Number: [id number] Date of Birth/Sex: Treating RN: 1946/04/28 (76 y.o. 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/04/2022 No Yes L97.812 Non-pressure chronic ulcer of other part of right lower leg with fat layer 07/14/2022 No Yes exposed I87.313 Chronic venous hypertension (idiopathic) with ulcer of bilateral lower extremity 07/14/2022 No Yes E11.622 Type 2 diabetes mellitus with other skin ulcer 05/25/2022 No Yes T79.8XXA Other early complications of trauma, initial encounter 06/23/2022 No Yes S81.801A Unspecified open wound, right lower leg, initial encounter 06/23/2022 No Yes S81.802A Unspecified open wound, left lower leg, initial encounter 06/23/2022 No Yes Inactive  Problems Resolved Problems ICD-10 Code Description Active Date Resolved Date W54.8XXA Other contact with dog, initial encounter 05/25/2022 05/25/2022 Electronic Signature(s) Signed: 08/10/2022 11:26:43 AM By: Kalman Shan DO Entered By: Kalman Shan on 08/10/2022 09:43:46 -------------------------------------------------------------------------------- Progress Note Details Patient Name: Date of Service: [name], [place] RO [name] 08/10/2022 8:15 A M Medical Record Number: [id number] Patient Account Number: [id number] Date of Birth/Sex: Treating RN: 10-Oct-1946 (76 y.o. F) Primary Care Provider: [name] Other Clinician: Referring Provider: Treating Provider/Extender: Hollie Beach in Treatment: 11 Subjective Chief Complaint Information obtained from Patient 05/25/2022; left lower extremity wound following a dog scratch 06/23/2022; lacerations to the right and left lower extremity status post fall History of Present Illness (HPI) Admission 05/25/2022 Ms. Ellizabeth Dacruz is a 76 year old female with a past medical history of diet-controlled type 2 diabetes, chronic 3 stage kidney disease and venous insufficiency that presents to the clinic for a 1 month history of nonhealing ulcer to the left leg. She states that her dog scratched her causing a wound. She has developed cellulitis in this leg and has been treated with clindamycin by her primary care physician. She reports improvement in her symptoms. She currently denies signs of infection. She has been keeping the area covered. She does not wear compression stockings. She is on 80 mg of Lasix twice daily. She has had reflux studies to the left leg on 07/2021 that notes venous reflux throughout the left common femoral vein and greater saphenous vein. She has not had an ablation. She follows with vein and vascular for her venous insufficiency. VIBHA, FERDIG ([id number]) [id number] Page 6 of  10 8/14; patient presents for follow-up. She states that the wrap stayed on for 4 days and eventually slid down. She has been wearing her compression stocking and doing dressing changes with Hydrofera Blue since. She also states she has a hard time putting on her shoes with the 3 layer compression. She denies signs of infection. 8/21; patient presents for follow-up. She again had trouble with the wrap sliding down. We have been using Hydrofera Blue with gentamicin under 2 layer Coflex. She currently denies signs of infection. 8/28; patient presents for follow-up. She reports taking the wrap off yesterday. We have been using Hydrofera Blue with gentamicin under 2 layer Coflex. She states that the wrap does slide down but remains over the wound. She is currently wearing her compression stocking. She would like to do this instead of the compression wrap. 9/5; patient presents for follow-up.  She has been using Medihoney to the left lower extremity remedy wound with her compression stockings. She reports no drainage from the site. Unfortunately she fell over the weekend and had to go to the ED due to lacerations she experienced on her left and right lower extremity. On the left leg she had 10 sutures placed on the right leg she had 7. She was given Keflex. Today she reports no signs of infection. She has been using Xeroform to the suture sites. 9/11; patient presents for follow-up. She has been using bacitracin ointment to the suture line. She currently denies signs of infection. 9/19; patient presents for follow-up. She has been using Medihoney to the wound beds. She denies signs of infection. 9/26; patient presents for follow-up. She has been using Dakin's wet-to-dry dressings to the wound beds. She reports improvement in wound healing. She has no issues or complaints today. 10/3; patient presents for follow-up. We had used compression therapy along with Hydrofera Blue to the right lower extremity. She  states she felt uncomfortable with the wrap and took it off 3 days ago. She has been using Medihoney without issues to the left lower extremity wound. She denies signs of infection. 10/10; patient presents for follow-up. She has been using Hydrofera Blue and Medihoney to the left lower extremity wound. We have been using Hydrofera Blue and Santyl under compression therapy to the right lower extremity. She states that the wrap slid down 2 days ago and she took it off. She has no issues or complaints today. She denies signs of infection. 10/17; patient presents for follow-up. We have been using Hydrofera Blue to the left lower extremity wounds and Hydrofera Blue and Santyl under compression therapy to the right lower extremity. She has no issues or complaints today. She denies signs of infection. 10/23; patient presents for follow-up. We have been using silver alginate to the left lower extremity and Hydrofera Blue and Santyl under Kerlix/Coban to the right lower extremity. It is unclear which she is using to the left leg. She denies signs of infection. She has no issues or complaints today. Patient History Information obtained from Patient. Family History Cancer - Siblings, Diabetes - Siblings, Heart Disease - Mother, Hypertension - Mother, No family history of Hereditary Spherocytosis, Kidney Disease, Lung Disease, Seizures, Stroke, Thyroid Problems, Tuberculosis. Social History Never smoker, Marital Status - Married, Alcohol Use - Never, Drug Use - No History, Caffeine Use - Moderate. Medical History Eyes Patient has history of Cataracts Denies history of Glaucoma Ear/Nose/Mouth/Throat Denies history of Chronic sinus problems/congestion, Middle ear problems Hematologic/Lymphatic Patient has history of Anemia Denies history of Hemophilia, Human Immunodeficiency Virus, Lymphedema, Sickle Cell Disease Respiratory Denies history of Aspiration, Asthma, Chronic Obstructive Pulmonary Disease  (COPD), Pneumothorax, Sleep Apnea, Tuberculosis Cardiovascular Patient has history of Congestive Heart Failure, Hypertension Denies history of Angina, Arrhythmia, Coronary Artery Disease, Deep Vein Thrombosis, Hypotension, Myocardial Infarction, Peripheral Arterial Disease, Peripheral Venous Disease, Phlebitis, Vasculitis Gastrointestinal Denies history of Cirrhosis , Colitis, Crohnoos, Hepatitis A, Hepatitis B, Hepatitis C Endocrine Denies history of Type I Diabetes, Type II Diabetes Genitourinary Denies history of End Stage Renal Disease Immunological Denies history of Lupus Erythematosus, Raynaudoos, Scleroderma Integumentary (Skin) Denies history of History of Burn Musculoskeletal Patient has history of Osteoarthritis Denies history of Gout, Rheumatoid Arthritis, Osteomyelitis Neurologic Denies history of Dementia, Neuropathy, Quadriplegia, Paraplegia, Seizure Disorder Oncologic Denies history of Received Chemotherapy Psychiatric Denies history of Anorexia/bulimia, Confinement Anxiety Hospitalization/Surgery History - right total knee replacement. Medical A Surgical History Notes  nd Gastrointestinal diverticulosis hiatal hernia hyperlipidemia Musculoskeletal DJD HONESTII, MARTON ([id number]) [id number] Page 7 of 10 Objective Constitutional respirations regular, non-labored and within target range for patient.. Vitals Time Taken: 8:25 AM, Height: 63 in, Temperature: 98.8 F, Pulse: 83 bpm, Respiratory Rate: 20 breaths/min, Blood Pressure: 133/88 mmHg. Cardiovascular 2+ dorsalis pedis/posterior tibialis pulses. Psychiatric pleasant and cooperative. General Notes: : Right lower extremity: Nonviable tissue and granulation tissue in the wound bed. 2+ pitting edema to the knee. Left lower extremity: 3 small narrow openings with increased depth of about 1 to 2 cm. No signs of infection. Integumentary (Hair, Skin) Wound #2 status is Open.  Original cause of wound was Trauma. The date acquired was: 06/20/2022. The wound has been in treatment 6 weeks. The wound is located on the Left,Lateral Upper Leg. The wound measures 0.3cm length x 3.4cm width x 1.3cm depth; 0.801cm^2 area and 1.041cm^3 volume. There is Fat Layer (Subcutaneous Tissue) exposed. There is no tunneling or undermining noted. There is a medium amount of serosanguineous drainage noted. The wound margin is distinct with the outline attached to the wound base. There is large (67-100%) red, pink granulation within the wound bed. There is no necrotic tissue within the wound bed. The periwound skin appearance had no abnormalities noted for texture. The periwound skin appearance had no abnormalities noted for moisture. The periwound skin appearance had no abnormalities noted for color. Periwound temperature was noted as No Abnormality. Wound #3 status is Open. Original cause of wound was Trauma. The date acquired was: 06/20/2022. The wound has been in treatment 6 weeks. The wound is located on the Right,Lateral Lower Leg. The wound measures 2.4cm length x 3cm width x 0.5cm depth; 5.655cm^2 area and 2.827cm^3 volume. There is Fat Layer (Subcutaneous Tissue) exposed. There is no tunneling or undermining noted. There is a medium amount of serosanguineous drainage noted. The wound margin is distinct with the outline attached to the wound base. There is large (67-100%) red, pink, hyper - granulation within the wound bed. There is a small (1- 33%) amount of necrotic tissue within the wound bed including Adherent Slough. The periwound skin appearance had no abnormalities noted for texture. The periwound skin appearance had no abnormalities noted for moisture. The periwound skin appearance exhibited: Hemosiderin Staining. The periwound skin appearance did not exhibit: Atrophie Blanche, Cyanosis, Ecchymosis, Mottled, Pallor, Rubor, Erythema. Periwound temperature was noted as No  Abnormality. Assessment Active Problems ICD-10 Non-pressure chronic ulcer of other part of left lower leg with fat layer exposed Non-pressure chronic ulcer of other part of right lower leg with fat layer exposed Chronic venous hypertension (idiopathic) with ulcer of bilateral lower extremity Type 2 diabetes mellitus with other skin ulcer Other early complications of trauma, initial encounter Unspecified open wound, right lower leg, initial encounter Unspecified open wound, left lower leg, initial encounter Patient's wounds appear well-healing. I debrided nonviable tissue to the right lower extremity and recommended continuing the course with Santyl and Hydrofera Blue under Kerlix/Coban here. T the 3 tunneled areas I recommended continuing with silver alginate and adding Medihoney. No signs of infection o to any of the wound beds. Follow-up in 1 week Procedures Wound #3 Pre-procedure diagnosis of Wound #3 is a Trauma, Other located on the Right,Lateral Lower Leg . There was a Excisional Skin/Subcutaneous Tissue Debridement with a total area of 7.2 sq cm performed by Kalman Shan, DO. With the following instrument(s): Curette to remove Viable and Non-Viable tissue/material. Material removed includes Subcutaneous Tissue and Slough and after achieving  pain control using Lidocaine. No specimens were taken. A time out was conducted at 09:15, prior to the start of the procedure. A Minimum amount of bleeding was controlled with Pressure. The procedure was tolerated well with a pain level of 0 throughout and a pain level of 0 following the procedure. Post Debridement Measurements: 2.4cm length x 3cm width x 0.5cm depth; 2.827cm^3 volume. Character of Wound/Ulcer Post Debridement is improved. Post procedure Diagnosis Wound #3: Same as Pre-Procedure Plan ANJOLI, DIEMER ([id number]) [phone].pdf Page 8 of 10 Follow-up Appointments: Return Appointment in 1 week. - Dr.  Heber [place] and [place], Room 8 Tuesday Return Appointment in 2 weeks. - Dr. Heber [place] and [place], Room 8 Tuesday Anesthetic: (In clinic) Topical Lidocaine 5% applied to wound bed Bathing/ Shower/ Hygiene: May shower with protection but do not get wound dressing(s) wet. Edema Control - Lymphedema / SCD / Other: Elevate legs to the level of the heart or above for 30 minutes daily and/or when sitting, a frequency of: - 2-3 times a day throughout the day. Avoid standing for long periods of time. Exercise regularly Moisturize legs daily. - lotion both legs every night before bed. Compression stocking or Garment 20-30 mm/Hg pressure to: - apply in the morning and remove at night. WOUND #2: - Upper Leg Wound Laterality: Left, Lateral Cleanser: dakin's solution Every Other Day/30 Days Discharge Instructions: clean with dakins Prim Dressing: KerraCel Ag Gelling Fiber Dressing, 2x2 in (silver alginate) (Generic) Every Other Day/30 Days ary Discharge Instructions: Apply silver alginate to wound bed as instructed Prim Dressing: MediHoney Gel, tube 1.5 (oz) Every Other Day/30 Days ary Discharge Instructions: Apply to wound bed as instructed Secondary Dressing: Woven Gauze Sponges 2x2 in Every Other Day/30 Days Discharge Instructions: Apply over primary dressing as directed. Secondary Dressing: Zetuvit Plus Silicone Border Dressing 7x7(in/in) Every Other Day/30 Days Discharge Instructions: Apply silicone border over primary dressing as directed. WOUND #3: - Lower Leg Wound Laterality: Right, Lateral Cleanser: Soap and Water 1 x Per Week/30 Days Discharge Instructions: May shower and wash wound with dial antibacterial soap and water prior to dressing change. Peri-Wound Care: Sween Lotion (Moisturizing lotion) 1 x Per Week/30 Days Discharge Instructions: Apply moisturizing lotion as directed Prim Dressing: Hydrofera Blue Classic Foam, 4x4 in 1 x Per Week/30 Days ary Discharge Instructions: Moisten with  saline prior to applying to wound bed Prim Dressing: Santyl Ointment 1 x Per Week/30 Days ary Discharge Instructions: Apply nickel thick amount to wound bed as instructed Secondary Dressing: Woven Gauze Sponges 2x2 in 1 x Per Week/30 Days Discharge Instructions: Apply over primary dressing as directed. Secondary Dressing: Zetuvit Plus 4x8 in 1 x Per Week/30 Days Discharge Instructions: Apply over primary dressing as directed. Com pression Wrap: Kerlix Roll 4.5x3.1 (in/yd) 1 x Per Week/30 Days Discharge Instructions: Apply Kerlix and Coban compression as directed. Com pression Wrap: Coban Self-Adherent Wrap 4x5 (in/yd) 1 x Per Week/30 Days Discharge Instructions: Apply over Kerlix as directed. 1. In office sharp debridement 2. Hydrofera Blue and Santyl under Kerlix/Cobanooright lower extremity 3. Medihoney and silver alginateooleft lower extremity 4. Follow-up in 1 week Electronic Signature(s) Signed: 08/10/2022 11:26:43 AM By: Kalman Shan DO Entered By: Kalman Shan on 08/10/2022 09:47:40 -------------------------------------------------------------------------------- HxROS Details Patient Name: Date of Service: [name], [place] RO [name] 08/10/2022 8:15 A M Medical Record Number: [id number] Patient Account Number: [id number] Date of Birth/Sex: Treating RN: 1946/07/15 (76 y.o. F) Primary Care Provider: [name] Other Clinician: Referring Provider: Treating Provider/Extender: Hollie Beach in  Treatment: 11 Information Obtained From Patient Eyes Medical History: Positive for: Cataracts Negative for: Glaucoma Ear/Nose/Mouth/Throat Medical HistoryLOREL, LEMBO ([id number]) [id number] Page 9 of 10 Negative for: Chronic sinus problems/congestion; Middle ear problems Hematologic/Lymphatic Medical History: Positive for: Anemia Negative for: Hemophilia; Human Immunodeficiency Virus; Lymphedema; Sickle Cell  Disease Respiratory Medical History: Negative for: Aspiration; Asthma; Chronic Obstructive Pulmonary Disease (COPD); Pneumothorax; Sleep Apnea; Tuberculosis Cardiovascular Medical History: Positive for: Congestive Heart Failure; Hypertension Negative for: Angina; Arrhythmia; Coronary Artery Disease; Deep Vein Thrombosis; Hypotension; Myocardial Infarction; Peripheral Arterial Disease; Peripheral Venous Disease; Phlebitis; Vasculitis Gastrointestinal Medical History: Negative for: Cirrhosis ; Colitis; Crohns; Hepatitis A; Hepatitis B; Hepatitis C Past Medical History Notes: diverticulosis hiatal hernia hyperlipidemia Endocrine Medical History: Negative for: Type I Diabetes; Type II Diabetes Genitourinary Medical History: Negative for: End Stage Renal Disease Immunological Medical History: Negative for: Lupus Erythematosus; Raynauds; Scleroderma Integumentary (Skin) Medical History: Negative for: History of Burn Musculoskeletal Medical History: Positive for: Osteoarthritis Negative for: Gout; Rheumatoid Arthritis; Osteomyelitis Past Medical History Notes: DJD Neurologic Medical History: Negative for: Dementia; Neuropathy; Quadriplegia; Paraplegia; Seizure Disorder Oncologic Medical History: Negative for: Received Chemotherapy Psychiatric Medical History: Negative for: Anorexia/bulimia; Confinement Anxiety HBO Extended History Items Eyes: Cataracts Immunizations Pneumococcal VaccineRODA, LAUTURE ([id number]) [id number] Page 10 of 10 Received Pneumococcal Vaccination: Yes Received Pneumococcal Vaccination On or After 60th Birthday: Yes Implantable Devices None Hospitalization / Surgery History Type of Hospitalization/Surgery right total knee replacement Family and Social History Cancer: Yes - Siblings; Diabetes: Yes - Siblings; Heart Disease: Yes - Mother; Hereditary Spherocytosis: No; Hypertension: Yes - Mother; Kidney  Disease: No; Lung Disease: No; Seizures: No; Stroke: No; Thyroid Problems: No; Tuberculosis: No; Never smoker; Marital Status - Married; Alcohol Use: Never; Drug Use: No History; Caffeine Use: Moderate; Financial Concerns: No; Food, Clothing or Shelter Needs: No; Support System Lacking: No; Transportation Concerns: No Electronic Signature(s) Signed: 08/10/2022 11:26:43 AM By: Kalman Shan DO Entered By: Kalman Shan on 08/10/2022 09:45:20 -------------------------------------------------------------------------------- SuperBill Details Patient Name: Date of Service: [name], [place] RO [name] 08/10/2022 Medical Record Number: [id number] Patient Account Number: [id number] Date of Birth/Sex: Treating RN: 1945/12/07 (76 y.o. [name], [name] Primary Care Provider: [name] Other Clinician: Referring Provider: Treating Provider/Extender: Hollie Beach in Treatment: 11 Diagnosis Coding ICD-10 Codes Code Description [phone] Non-pressure chronic ulcer of other part of left lower leg with fat layer exposed L97.812 Non-pressure chronic ulcer of other part of right lower leg with fat layer exposed I87.313 Chronic venous hypertension (idiopathic) with ulcer of bilateral lower extremity E11.622 Type 2 diabetes mellitus with other skin ulcer T79.8XXA Other early complications of trauma, initial encounter S81.801A Unspecified open wound, right lower leg, initial encounter S81.802A Unspecified open wound, left lower leg, initial encounter Facility Procedures : CPT4 Code: [id number] Description: [place] VISIT-LEV 3 EST PT Modifier: Quantity: 1 : CPT4 Code: [id number] Description: 29518 - DEB SUBQ TISSUE 20 SQ CM/< ICD-10 Diagnosis Description A41.660 Non-pressure chronic ulcer of other part of right lower leg with fat layer expos Modifier: ed Quantity: 1 Physician Procedures : CPT4 Code Description Modifier [id number] 11042 - WC PHYS SUBQ TISS 20 SQ CM ICD-10  Diagnosis Description U93.235 Non-pressure chronic ulcer of other part of right lower leg with fat layer exposed Quantity: 1 Electronic Signature(s) Signed: 08/10/2022 11:26:43 AM By: Kalman Shan DO Entered By: Kalman Shan on 08/10/2022 09:47:48

## 2022-08-17 ENCOUNTER — Encounter (HOSPITAL_BASED_OUTPATIENT_CLINIC_OR_DEPARTMENT_OTHER): Payer: Medicare HMO | Admitting: Internal Medicine

## 2022-08-17 DIAGNOSIS — L97822 Non-pressure chronic ulcer of other part of left lower leg with fat layer exposed: Secondary | ICD-10-CM | POA: Diagnosis not present

## 2022-08-17 DIAGNOSIS — E1122 Type 2 diabetes mellitus with diabetic chronic kidney disease: Secondary | ICD-10-CM | POA: Diagnosis not present

## 2022-08-17 DIAGNOSIS — L97812 Non-pressure chronic ulcer of other part of right lower leg with fat layer exposed: Secondary | ICD-10-CM | POA: Diagnosis not present

## 2022-08-17 DIAGNOSIS — I87313 Chronic venous hypertension (idiopathic) with ulcer of bilateral lower extremity: Secondary | ICD-10-CM | POA: Diagnosis not present

## 2022-08-17 DIAGNOSIS — E11622 Type 2 diabetes mellitus with other skin ulcer: Secondary | ICD-10-CM

## 2022-08-17 DIAGNOSIS — S81802A Unspecified open wound, left lower leg, initial encounter: Secondary | ICD-10-CM | POA: Diagnosis not present

## 2022-08-17 DIAGNOSIS — Z79899 Other long term (current) drug therapy: Secondary | ICD-10-CM | POA: Diagnosis not present

## 2022-08-17 DIAGNOSIS — N183 Chronic kidney disease, stage 3 unspecified: Secondary | ICD-10-CM | POA: Diagnosis not present

## 2022-08-17 DIAGNOSIS — I872 Venous insufficiency (chronic) (peripheral): Secondary | ICD-10-CM | POA: Diagnosis not present

## 2022-08-18 ENCOUNTER — Encounter: Payer: Self-pay | Admitting: Internal Medicine

## 2022-08-18 ENCOUNTER — Ambulatory Visit (INDEPENDENT_AMBULATORY_CARE_PROVIDER_SITE_OTHER): Payer: Medicare HMO | Admitting: Internal Medicine

## 2022-08-18 ENCOUNTER — Ambulatory Visit (INDEPENDENT_AMBULATORY_CARE_PROVIDER_SITE_OTHER): Payer: Medicare HMO

## 2022-08-18 VITALS — BP 132/76 | HR 77 | Temp 98.2°F | Ht 63.0 in | Wt 238.0 lb

## 2022-08-18 DIAGNOSIS — M48062 Spinal stenosis, lumbar region with neurogenic claudication: Secondary | ICD-10-CM | POA: Diagnosis not present

## 2022-08-18 DIAGNOSIS — J22 Unspecified acute lower respiratory infection: Secondary | ICD-10-CM

## 2022-08-18 DIAGNOSIS — M4326 Fusion of spine, lumbar region: Secondary | ICD-10-CM | POA: Diagnosis not present

## 2022-08-18 DIAGNOSIS — R058 Other specified cough: Secondary | ICD-10-CM | POA: Diagnosis not present

## 2022-08-18 DIAGNOSIS — R059 Cough, unspecified: Secondary | ICD-10-CM | POA: Diagnosis not present

## 2022-08-18 DIAGNOSIS — I10 Essential (primary) hypertension: Secondary | ICD-10-CM | POA: Diagnosis not present

## 2022-08-18 MED ORDER — AMOXICILLIN-POT CLAVULANATE 875-125 MG PO TABS: 1.0000 | ORAL_TABLET | Freq: Two times a day (BID) | ORAL | 0 refills | Status: AC

## 2022-08-18 MED ORDER — PROMETHAZINE-DM 6.25-15 MG/5ML PO SYRP: 5.0000 mL | ORAL_SOLUTION | Freq: Four times a day (QID) | ORAL | 0 refills | Status: DC | PRN

## 2022-08-18 NOTE — Progress Notes (Unsigned)
Subjective:  Patient ID: [name], female    DOB: 1946-05-31  Age: 76 y.o. MRN: [id number]  CC: Cough   HPI Kristain Filo presents for an acute visit.  She complains of a 2-week history of cough productive of thick yellow phlegm with chills.  She denies shortness of breath, night sweats, fever, chills, chest pain, wheezing, or hemoptysis.  Outpatient Medications Prior to Visit  Medication Sig Dispense Refill   aspirin-acetaminophen-caffeine (EXCEDRIN MIGRAINE) 250-250-65 MG tablet Take 1 tablet by mouth every 6 (six) hours as needed for headache.     atorvastatin (LIPITOR) 40 MG tablet Take 1 tablet (40 mg total) by mouth daily. 90 tablet 3   azelastine (ASTELIN) 0.1 % nasal spray Place 1 spray into both nostrils 2 (two) times daily. Use in each nostril as directed 90 mL 4   azelastine (OPTIVAR) 0.05 % ophthalmic solution Place 1 drop into both eyes 2 (two) times daily. 6 mL 12   betamethasone, augmented, (DIPROLENE) 0.05 % lotion Apply topically daily as needed.     cetirizine (ZYRTEC) 10 MG tablet Take 1 tablet (10 mg total) by mouth daily. As needed (Patient taking differently: Take 10 mg by mouth daily as needed for allergies. As needed) 90 tablet 3   Cholecalciferol 50 MCG (2000 UT) TABS 1 tab by mouth once daily 30 tablet 99   diltiazem (DILACOR XR) 240 MG 24 hr capsule TAKE 1 CAPSULE EVERY DAY 90 capsule 2   DULoxetine (CYMBALTA) 20 MG capsule TAKE 2 CAPSULES EVERY DAY 180 capsule 3   ferrous sulfate 325 (65 FE) MG tablet Take 1 tablet (325 mg total) by mouth 2 (two) times daily with a meal.  3   furosemide (LASIX) 80 MG tablet TAKE 1 TABLET TWICE DAILY 180 tablet 1   gabapentin (NEURONTIN) 100 MG capsule TAKE 2 CAPSULES AT BEDTIME 180 capsule 1   HYDROcodone-acetaminophen (NORCO/VICODIN) 5-325 MG tablet Take 0.5 tablets by mouth daily as needed.     hydrOXYzine (ATARAX) 10 MG tablet Take 1 tablet (10 mg total) by mouth at bedtime as needed for itching. 60 tablet  2   losartan (COZAAR) 100 MG tablet Take 1 tablet (100 mg total) by mouth daily. Annual appt due in Sept must see provider for future refills 90 tablet 0   metoprolol tartrate (LOPRESSOR) 50 MG tablet TAKE 1 TABLET TWICE DAILY 180 tablet 3   ondansetron (ZOFRAN) 4 MG tablet Take 1 tablet (4 mg total) by mouth every 8 (eight) hours as needed for nausea or vomiting. 40 tablet 1   pantoprazole (PROTONIX) 40 MG tablet TAKE 1 TABLET(40 MG) BY MOUTH DAILY 90 tablet 0   Polyethyl Glycol-Propyl Glycol (SYSTANE OP) Place 1 drop into both eyes 3 (three) times daily as needed (dry eyes).     Polyethylene Glycol 3350 (MIRALAX PO) Take 17 g by mouth daily as needed (constipation).      potassium chloride (KLOR-CON M) 10 MEQ tablet TAKE 1 TABLET EVERY DAY 90 tablet 2   tiZANidine (ZANAFLEX) 2 MG tablet Take 1 tablet (2 mg total) by mouth every 6 (six) hours as needed for muscle spasms. 60 tablet 3   triamcinolone (NASACORT) 55 MCG/ACT AERO nasal inhaler Place 2 sprays into the nose daily. 1 each 12   triamcinolone cream (KENALOG) 0.1 % Apply 1 application topically 2 (two) times daily. 453 g 0   vitamin B-12 (CYANOCOBALAMIN) 1000 MCG tablet Take 1,000 mcg by mouth daily.     clindamycin (CLEOCIN) 300  MG capsule Take 1 capsule (300 mg total) by mouth 3 (three) times daily. 30 capsule 0   Omadacycline Tosylate (NUZYRA) 150 MG TABS Take 2 tablets by mouth daily. 12 tablet 0   No facility-administered medications prior to visit.    ROS Review of Systems  Constitutional:  Positive for chills. Negative for diaphoresis, fatigue and fever.  HENT: Negative.    Eyes: Negative.   Respiratory:  Positive for cough. Negative for chest tightness, shortness of breath and wheezing.   Cardiovascular:  Negative for chest pain, palpitations and leg swelling.  Gastrointestinal:  Negative for abdominal pain, constipation, diarrhea and vomiting.  Genitourinary: Negative.  Negative for difficulty urinating.  Musculoskeletal:  Negative.   Skin: Negative.   Neurological:  Negative for dizziness, weakness, light-headedness and headaches.  Hematological:  Negative for adenopathy. Does not bruise/bleed easily.  Psychiatric/Behavioral: Negative.      Objective:  BP 132/76 (BP Location: Right Arm, Patient Position: Sitting, Cuff Size: Large)   Pulse 77   Temp 98.2 F (36.8 C) (Oral)   Ht '5\' 3"'$  (1.6 m)   Wt 238 lb (108 kg)   SpO2 95%   BMI 42.16 kg/m   BP Readings from Last 3 Encounters:  08/18/22 132/76  06/20/22 (!) 141/78  05/15/22 138/84    Wt Readings from Last 3 Encounters:  08/18/22 238 lb (108 kg)  05/15/22 231 lb 6 oz (105 kg)  05/07/22 230 lb (104.3 kg)    Physical Exam Vitals reviewed.  Constitutional:      Appearance: She is not ill-appearing.  HENT:     Nose: Nose normal.     Mouth/Throat:     Mouth: Mucous membranes are moist.  Eyes:     General: No scleral icterus.    Conjunctiva/sclera: Conjunctivae normal.  Cardiovascular:     Rate and Rhythm: Normal rate and regular rhythm.     Heart sounds: No murmur heard. Pulmonary:     Effort: Pulmonary effort is normal.     Breath sounds: No stridor. No wheezing, rhonchi or rales.  Abdominal:     General: Abdomen is flat and protuberant. There is no distension.     Palpations: There is no mass.     Tenderness: There is no abdominal tenderness. There is no guarding.     Hernia: No hernia is present.  Musculoskeletal:        General: Normal range of motion.     Cervical back: Neck supple.     Right lower leg: No edema.     Left lower leg: No edema.  Lymphadenopathy:     Cervical: No cervical adenopathy.  Skin:    General: Skin is warm and dry.  Neurological:     General: No focal deficit present.     Mental Status: She is alert.     Lab Results  Component Value Date   WBC 10.4 05/15/2022   HGB 12.0 05/15/2022   HCT 36.4 05/15/2022   PLT 352.0 05/15/2022   GLUCOSE 104 (H) 05/15/2022   CHOL 172 05/15/2022   TRIG 155.0  (H) 05/15/2022   HDL 56.00 05/15/2022   LDLDIRECT 93.0 01/14/2021   LDLCALC 85 05/15/2022   ALT 15 05/15/2022   AST 15 05/15/2022   NA 142 05/15/2022   K 3.7 05/15/2022   CL 102 05/15/2022   CREATININE 1.03 05/15/2022   BUN 23 05/15/2022   CO2 32 05/15/2022   TSH 0.42 05/15/2022   INR 1.2 08/13/2020   HGBA1C 6.6 (H)  05/15/2022   MICROALBUR <0.7 05/15/2022    CT Head Wo Contrast  Result Date: 06/20/2022 CLINICAL DATA:  Fall from standing.  Close head injury. EXAM: CT HEAD WITHOUT CONTRAST CT CERVICAL SPINE WITHOUT CONTRAST TECHNIQUE: Multidetector CT imaging of the head and cervical spine was performed following the standard protocol without intravenous contrast. Multiplanar CT image reconstructions of the cervical spine were also generated. RADIATION DOSE REDUCTION: This exam was performed according to the departmental dose-optimization program which includes automated exposure control, adjustment of the mA and/or kV according to patient size and/or use of iterative reconstruction technique. COMPARISON:  None Available. FINDINGS: CT HEAD FINDINGS Brain: There is no evidence for acute hemorrhage, hydrocephalus, mass lesion, or abnormal extra-axial fluid collection. No definite CT evidence for acute infarction. Diffuse loss of parenchymal volume is consistent with atrophy. Patchy low attenuation in the deep hemispheric and periventricular white matter is nonspecific, but likely reflects chronic microvascular ischemic demyelination. Vascular: No hyperdense vessel or unexpected calcification. Skull: No evidence for fracture. No worrisome lytic or sclerotic lesion. Sinuses/Orbits: The visualized paranasal sinuses and mastoid air cells are clear. Visualized portions of the globes and intraorbital fat are unremarkable. Other: None. CT CERVICAL SPINE FINDINGS Alignment: Reversal of normal cervical lordosis. Trace anterolisthesis at C3 on 4 is compatible with the facet degeneration at the same level. Skull  base and vertebrae: No acute fracture. No primary bone lesion or focal pathologic process. Soft tissues and spinal canal: No prevertebral fluid or swelling. No visible canal hematoma. Disc levels: Mild loss of disc height noted C5-6 and C6-7 with endplate spurring at both levels. Upper chest: Unremarkable. Other: Non. IMPRESSION: 1. No acute intracranial abnormality. 2. Atrophy with chronic small vessel ischemic disease. 3. No cervical spine fracture or subluxation. Reversal of cervical lordosis likely related to patient positioning although muscle spasm or soft tissue injury could produce this appearance. 4. Degenerative changes in the cervical spine as above. Electronically Signed   By: Misty Stanley M.D.   On: 06/20/2022 16:43   CT Cervical Spine Wo Contrast  Result Date: 06/20/2022 CLINICAL DATA:  Fall from standing.  Close head injury. EXAM: CT HEAD WITHOUT CONTRAST CT CERVICAL SPINE WITHOUT CONTRAST TECHNIQUE: Multidetector CT imaging of the head and cervical spine was performed following the standard protocol without intravenous contrast. Multiplanar CT image reconstructions of the cervical spine were also generated. RADIATION DOSE REDUCTION: This exam was performed according to the departmental dose-optimization program which includes automated exposure control, adjustment of the mA and/or kV according to patient size and/or use of iterative reconstruction technique. COMPARISON:  None Available. FINDINGS: CT HEAD FINDINGS Brain: There is no evidence for acute hemorrhage, hydrocephalus, mass lesion, or abnormal extra-axial fluid collection. No definite CT evidence for acute infarction. Diffuse loss of parenchymal volume is consistent with atrophy. Patchy low attenuation in the deep hemispheric and periventricular white matter is nonspecific, but likely reflects chronic microvascular ischemic demyelination. Vascular: No hyperdense vessel or unexpected calcification. Skull: No evidence for fracture. No  worrisome lytic or sclerotic lesion. Sinuses/Orbits: The visualized paranasal sinuses and mastoid air cells are clear. Visualized portions of the globes and intraorbital fat are unremarkable. Other: None. CT CERVICAL SPINE FINDINGS Alignment: Reversal of normal cervical lordosis. Trace anterolisthesis at C3 on 4 is compatible with the facet degeneration at the same level. Skull base and vertebrae: No acute fracture. No primary bone lesion or focal pathologic process. Soft tissues and spinal canal: No prevertebral fluid or swelling. No visible canal hematoma. Disc levels: Mild loss  of disc height noted C5-6 and C6-7 with endplate spurring at both levels. Upper chest: Unremarkable. Other: Non. IMPRESSION: 1. No acute intracranial abnormality. 2. Atrophy with chronic small vessel ischemic disease. 3. No cervical spine fracture or subluxation. Reversal of cervical lordosis likely related to patient positioning although muscle spasm or soft tissue injury could produce this appearance. 4. Degenerative changes in the cervical spine as above. Electronically Signed   By: Misty Stanley M.D.   On: 06/20/2022 16:43   DG FEMUR MIN 2 VIEWS LEFT  Result Date: 06/20/2022 CLINICAL DATA:  Fall EXAM: LEFT FEMUR 2 VIEWS COMPARISON:  None Available. FINDINGS: There is no evidence of fracture or other focal bone lesions. 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 Soft tissues are unremarkable. IMPRESSION: 1. No fracture or dislocation of the left femur. 2. 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 Electronically Signed   By: Delanna Ahmadi M.D.   On: 06/20/2022 16:40   DG Tibia/Fibula Right  Result Date: 06/20/2022 CLINICAL DATA:  Laceration to leg after fall. EXAM: RIGHT TIBIA AND FIBULA - 2 VIEW COMPARISON:  None Available. FINDINGS: Status post tricompartmental knee replacement. No evidence for an acute fracture in the tibia or fibula. Ankle is not well assessed on this  study. IMPRESSION: 1. No acute bony abnormality in this patient status post tricompartmental knee replacement. 2. Ankle is not well evaluated on this exam. If there is clinical concern for ankle injury, dedicated x-ray exam recommended. Electronically Signed   By: Misty Stanley M.D.   On: 06/20/2022 16:39    DG Chest 2 View  Result Date: 08/18/2022 CLINICAL DATA:  Productive cough x 2 weeks. Hx of CHF, HTN. Nonsmoker.Cough, yellow phlegm, 2 weeks EXAM: CHEST - 2 VIEW COMPARISON:  08/12/2020 FINDINGS: Normal cardiac silhouette. Coarsened central bronchovascular markings compared to comparison exam 2021. No focal consolidation. No pulmonary edema. No pneumothorax. IMPRESSION: Coarsened bronchitic markings centrally could represent bronchitis. No pneumonia identified. Electronically Signed   By: Suzy Bouchard M.D.   On: 08/18/2022 16:31     Assessment & Plan:   Perrie was seen today for cough.  Diagnoses and all orders for this visit:  Cough productive of yellow sputum- Shest x-ray is negative for mass or infiltrate. -     DG Chest 2 View; Future -     promethazine-dextromethorphan (PROMETHAZINE-DM) 6.25-15 MG/5ML syrup; Take 5 mLs by mouth 4 (four) times daily as needed for cough.  LRTI (lower respiratory tract infection) -     amoxicillin-clavulanate (AUGMENTIN) 875-125 MG tablet; Take 1 tablet by mouth 2 (two) times daily for 7 days. -     promethazine-dextromethorphan (PROMETHAZINE-DM) 6.25-15 MG/5ML syrup; Take 5 mLs by mouth 4 (four) times daily as needed for cough.   I have discontinued Hoyle Sauer B. Wyatt's Nuzyra and clindamycin. I am also having her start on amoxicillin-clavulanate and promethazine-dextromethorphan. Additionally, I am having her maintain her cetirizine, Polyethyl Glycol-Propyl Glycol (SYSTANE OP), Polyethylene Glycol 3350 (MIRALAX PO), triamcinolone cream, ferrous sulfate, azelastine, HYDROcodone-acetaminophen, Cholecalciferol, atorvastatin, cyanocobalamin,  aspirin-acetaminophen-caffeine, tiZANidine, furosemide, diltiazem, potassium chloride, ondansetron, betamethasone (augmented), triamcinolone, azelastine, DULoxetine, metoprolol tartrate, hydrOXYzine, losartan, pantoprazole, and gabapentin.  Meds ordered this encounter  Medications   amoxicillin-clavulanate (AUGMENTIN) 875-125 MG tablet    Sig: Take 1 tablet by mouth 2 (two) times daily for 7 days.    Dispense:  14 tablet    Refill:  0   promethazine-dextromethorphan (PROMETHAZINE-DM) 6.25-15 MG/5ML syrup    Sig: Take 5 mLs by mouth  4 (four) times daily as needed for cough.    Dispense:  118 mL    Refill:  0     Follow-up: Return in about 3 weeks (around 09/08/2022).  Scarlette Calico, MD

## 2022-08-18 NOTE — Patient Instructions (Signed)

## 2022-08-20 ENCOUNTER — Other Ambulatory Visit: Payer: Self-pay | Admitting: Internal Medicine

## 2022-08-20 ENCOUNTER — Ambulatory Visit (HOSPITAL_BASED_OUTPATIENT_CLINIC_OR_DEPARTMENT_OTHER): Payer: Medicare HMO | Admitting: Internal Medicine

## 2022-08-20 NOTE — Telephone Encounter (Signed)
Please refill as per office routine med refill policy (all routine meds to be refilled for 3 mo or monthly (per pt preference) up to one year from last visit, then month to month grace period for 3 mo, then further med refills will have to be denied) ? ?

## 2022-08-24 ENCOUNTER — Other Ambulatory Visit: Payer: Self-pay | Admitting: Internal Medicine

## 2022-08-24 ENCOUNTER — Encounter (HOSPITAL_BASED_OUTPATIENT_CLINIC_OR_DEPARTMENT_OTHER): Payer: Medicare HMO | Admitting: General Surgery

## 2022-08-27 ENCOUNTER — Other Ambulatory Visit: Payer: Self-pay | Admitting: Internal Medicine

## 2022-08-27 NOTE — Telephone Encounter (Signed)
Please refill as per office routine med refill policy (all routine meds to be refilled for 3 mo or monthly (per pt preference) up to one year from last visit, then month to month grace period for 3 mo, then further med refills will have to be denied) ? ?

## 2022-08-31 ENCOUNTER — Encounter (HOSPITAL_BASED_OUTPATIENT_CLINIC_OR_DEPARTMENT_OTHER): Payer: Medicare HMO | Attending: Internal Medicine | Admitting: Internal Medicine

## 2022-08-31 DIAGNOSIS — L97822 Non-pressure chronic ulcer of other part of left lower leg with fat layer exposed: Secondary | ICD-10-CM | POA: Diagnosis not present

## 2022-08-31 DIAGNOSIS — E1122 Type 2 diabetes mellitus with diabetic chronic kidney disease: Secondary | ICD-10-CM | POA: Diagnosis not present

## 2022-08-31 DIAGNOSIS — I509 Heart failure, unspecified: Secondary | ICD-10-CM | POA: Diagnosis not present

## 2022-08-31 DIAGNOSIS — I130 Hypertensive heart and chronic kidney disease with heart failure and stage 1 through stage 4 chronic kidney disease, or unspecified chronic kidney disease: Secondary | ICD-10-CM | POA: Diagnosis not present

## 2022-08-31 DIAGNOSIS — E785 Hyperlipidemia, unspecified: Secondary | ICD-10-CM | POA: Diagnosis not present

## 2022-08-31 DIAGNOSIS — I872 Venous insufficiency (chronic) (peripheral): Secondary | ICD-10-CM | POA: Insufficient documentation

## 2022-08-31 DIAGNOSIS — N189 Chronic kidney disease, unspecified: Secondary | ICD-10-CM | POA: Diagnosis not present

## 2022-08-31 DIAGNOSIS — L97812 Non-pressure chronic ulcer of other part of right lower leg with fat layer exposed: Secondary | ICD-10-CM | POA: Diagnosis not present

## 2022-08-31 DIAGNOSIS — E11622 Type 2 diabetes mellitus with other skin ulcer: Secondary | ICD-10-CM | POA: Insufficient documentation

## 2022-08-31 DIAGNOSIS — M1990 Unspecified osteoarthritis, unspecified site: Secondary | ICD-10-CM | POA: Diagnosis not present

## 2022-08-31 NOTE — Progress Notes (Signed)
JEANNA, GIUFFRE ([id number]) [id number] Page 1 of 10 Visit Report for 08/31/2022 Chief Complaint Document Details Patient Name: Date of Service: Brandy Corolla [name]. 08/31/2022 9:15 A M Medical Record Number: [id number] Patient Account Number: [id number] Date of Birth/Sex: Treating RN: Nov 21, 1945 (76 y.o. F) Primary Care Provider: [name] Other Clinician: Referring Provider: Treating Provider/Extender: Hollie Beach in Treatment: 14 Information Obtained from: Patient Chief Complaint 05/25/2022; left lower extremity wound following a dog scratch 06/23/2022; lacerations to the right and left lower extremity status post fall Electronic Signature(s) Signed: 08/31/2022 1:13:56 PM By: Kalman Shan DO Entered By: Kalman Shan on 08/31/2022 12:30:46 -------------------------------------------------------------------------------- Debridement Details Patient Name: Date of Service: [name], Brandy [name] 08/31/2022 9:15 A M Medical Record Number: [id number] Patient Account Number: [id number] Date of Birth/Sex: Treating RN: Mar 15, 1946 (76 y.o. [name], [name] Primary Care Provider: [name] Other Clinician: Referring Provider: Treating Provider/Extender: Hollie Beach in Treatment: 14 Debridement Performed for Assessment: Wound #3 Right,Lateral Lower Leg Performed By: Physician Kalman Shan, DO Debridement Type: Debridement Level of Consciousness (Pre-procedure): Awake and Alert Pre-procedure Verification/Time Out Yes - 10:29 Taken: Start Time: 10:29 Pain Control: Lidocaine T Area Debrided (L x W): otal 1.6 (cm) x 2.4 (cm) = 3.84 (cm) Tissue and other material debrided: Viable, Non-Viable, Slough, Subcutaneous, Slough Level: Skin/Subcutaneous Tissue Debridement Description: Excisional Instrument: Curette Bleeding: Minimum Hemostasis Achieved: Pressure End Time: 10:29 Procedural Pain: 0 Post  Procedural Pain: 0 Response to Treatment: Procedure was tolerated well Level of Consciousness (Post- Awake and Alert procedure): Post Debridement Measurements of Total Wound Length: (cm) 1.6 Width: (cm) 2.4 Depth: (cm) 0.4 Volume: (cm) 1.206 Character of Wound/Ulcer Post Debridement: Improved [name], [name] ([id number]) [id number] Page 2 of 10 Post Procedure Diagnosis Same as Pre-procedure Electronic Signature(s) Signed: 08/31/2022 1:13:56 PM By: Kalman Shan DO Signed: 08/31/2022 4:10:29 PM By: Rhae Hammock RN Entered By: Rhae Hammock on 08/31/2022 10:30:38 -------------------------------------------------------------------------------- HPI Details Patient Name: Date of Service: [name], Brandy [name] 08/31/2022 9:15 A M Medical Record Number: [id number] Patient Account Number: [id number] Date of Birth/Sex: Treating RN: 04-Nov-1945 (76 y.o. F) Primary Care Provider: [name] Other Clinician: Referring Provider: Treating Provider/Extender: Hollie Beach in Treatment: 14 History of Present Illness HPI Description: Admission 05/25/2022 [name] is a 76 year old female with a past medical history of diet-controlled type 2 diabetes, chronic 3 stage kidney disease and venous insufficiency that presents to the clinic for a 1 month history of nonhealing ulcer to the left leg. She states that her dog scratched her causing a wound. She has developed cellulitis in this leg and has been treated with clindamycin by her primary care physician. She reports improvement in her symptoms. She currently denies signs of infection. She has been keeping the area covered. She does not wear compression stockings. She is on 80 mg of Lasix twice daily. She has had reflux studies to the left leg on 07/2021 that notes venous reflux throughout the left common femoral vein and greater saphenous vein. She has not had an ablation. She  follows with vein and vascular for her venous insufficiency. 8/14; patient presents for follow-up. She states that the wrap stayed on for 4 days and eventually slid down. She has been wearing her compression stocking and doing dressing changes with Hydrofera Blue since. She also states she has a hard time putting on her shoes with the 3 layer compression. She denies signs of infection. 8/21; patient  presents for follow-up. She again had trouble with the wrap sliding down. We have been using Hydrofera Blue with gentamicin under 2 layer Coflex. She currently denies signs of infection. 8/28; patient presents for follow-up. She reports taking the wrap off yesterday. We have been using Hydrofera Blue with gentamicin under 2 layer Coflex. She states that the wrap does slide down but remains over the wound. She is currently wearing her compression stocking. She would like to do this instead of the compression wrap. 9/5; patient presents for follow-up. She has been using Medihoney to the left lower extremity remedy wound with her compression stockings. She reports no drainage from the site. Unfortunately she fell over the weekend and had to go to the ED due to lacerations she experienced on her left and right lower extremity. On the left leg she had 10 sutures placed on the right leg she had 7. She was given Keflex. Today she reports no signs of infection. She has been using Xeroform to the suture sites. 9/11; patient presents for follow-up. She has been using bacitracin ointment to the suture line. She currently denies signs of infection. 9/19; patient presents for follow-up. She has been using Medihoney to the wound beds. She denies signs of infection. 9/26; patient presents for follow-up. She has been using Dakin's wet-to-dry dressings to the wound beds. She reports improvement in wound healing. She has no issues or complaints today. 10/3; patient presents for follow-up. We had used compression therapy  along with Hydrofera Blue to the right lower extremity. She states she felt uncomfortable with the wrap and took it off 3 days ago. She has been using Medihoney without issues to the left lower extremity wound. She denies signs of infection. 10/10; patient presents for follow-up. She has been using Hydrofera Blue and Medihoney to the left lower extremity wound. We have been using Hydrofera Blue and Santyl under compression therapy to the right lower extremity. She states that the wrap slid down 2 days ago and she took it off. She has no issues or complaints today. She denies signs of infection. 10/17; patient presents for follow-up. We have been using Hydrofera Blue to the left lower extremity wounds and Hydrofera Blue and Santyl under compression therapy to the right lower extremity. She has no issues or complaints today. She denies signs of infection. 10/23; patient presents for follow-up. We have been using silver alginate to the left lower extremity and Hydrofera Blue and Santyl under Kerlix/Coban to the right lower extremity. It is unclear which she is using to the left leg. She denies signs of infection. She has no issues or complaints today. 10/30; patient presents for follow-up. We have been using Hydrofera Blue and Santyl under Kerlix/Coban to the right lower extremity and silver alginate with Medihoney to the left lower extremity under compression stocking. She has no issues or complaints today. 11/13; patient presents for follow-up. We have been using Hydrofera Blue and Santyl under Kerlix/Coban to the right lower extremity. We have been using Medihoney and silver alginate to the left lower extremity wounds. This area is healed. She has no issues or complaints today. Electronic Signature(s) Signed: 08/31/2022 1:13:56 PM By: Kalman Shan DO Entered By: Kalman Shan on 08/31/2022 12:31:30 [name] ([id number]) [id number] Page 3 of  10 -------------------------------------------------------------------------------- Physical Exam Details Patient Name: Date of Service: Brandy Corolla [name]. 08/31/2022 9:15 A M Medical Record Number: [id number] Patient Account Number: [id number] Date of Birth/Sex: Treating RN: [birth year] (76 y.o. F) Primary  Care Provider: [name] Other Clinician: Referring Provider: Treating Provider/Extender: Hollie Beach in Treatment: 14 Constitutional respirations regular, non-labored and within target range for patient.. Cardiovascular 2+ dorsalis pedis/posterior tibialis pulses. Psychiatric pleasant and cooperative. Notes Right lower extremity: Nonviable tissue and granulation tissue in the wound bed. 2+ pitting edema to the knee. Left lower extremity: Epithelization to the previous wound site Electronic Signature(s) Signed: 08/31/2022 1:13:56 PM By: Kalman Shan DO Entered By: Kalman Shan on 08/31/2022 12:34:25 -------------------------------------------------------------------------------- Physician Orders Details Patient Name: Date of Service: [name], Brandy [name] 08/31/2022 9:15 A M Medical Record Number: [id number] Patient Account Number: [id number] Date of Birth/Sex: Treating RN: 1946/05/25 (76 y.o. [name], [name] Primary Care Provider: [name] Other Clinician: Referring Provider: Treating Provider/Extender: Hollie Beach in Treatment: 14 Verbal / Phone Orders: No Diagnosis Coding Follow-up Appointments ppointment in 1 week. - w/ Dr. Heber [place] Return A Anesthetic (In clinic) Topical Lidocaine 5% applied to wound bed Bathing/ Shower/ Hygiene May shower with protection but do not get wound dressing(s) wet. Edema Control - Lymphedema / SCD / Other Elevate legs to the level of the heart or above for 30 minutes daily and/or when sitting, a frequency of: - 2-3 times a day throughout the day. Avoid standing for long  periods of time. Exercise regularly Moisturize legs daily. - lotion both legs every night before bed. Compression stocking or Garment 20-30 mm/Hg pressure to: - apply in the morning and remove at night. Wound Treatment Wound #3 - Lower Leg Wound Laterality: Right, Lateral Cleanser: Soap and Water 1 x Per Week/30 Days Discharge Instructions: May shower and wash wound with dial antibacterial soap and water prior to dressing change. [name], [name] ([id number]) [id number] Page 4 of 10 Peri-Wound Care: Sween Lotion (Moisturizing lotion) 1 x Per Week/30 Days Discharge Instructions: Apply moisturizing lotion as directed Prim Dressing: Hydrofera Blue Classic Foam, 4x4 in 1 x Per Week/30 Days ary Discharge Instructions: Moisten with saline prior to applying to wound bed Prim Dressing: Santyl Ointment 1 x Per Week/30 Days ary Discharge Instructions: Apply nickel thick amount to wound bed as instructed Secondary Dressing: Woven Gauze Sponges 2x2 in 1 x Per Week/30 Days Discharge Instructions: Apply over primary dressing as directed. Secondary Dressing: Zetuvit Plus 4x8 in 1 x Per Week/30 Days Discharge Instructions: Apply over primary dressing as directed. Compression Wrap: Kerlix Roll 4.5x3.1 (in/yd) 1 x Per Week/30 Days Discharge Instructions: Apply Kerlix and Coban compression as directed. Compression Wrap: Coban Self-Adherent Wrap 4x5 (in/yd) 1 x Per Week/30 Days Discharge Instructions: Apply over Kerlix as directed. Electronic Signature(s) Signed: 08/31/2022 1:13:56 PM By: Kalman Shan DO Entered By: Kalman Shan on 08/31/2022 12:37:44 -------------------------------------------------------------------------------- Problem List Details Patient Name: Date of Service: [name], Brandy [name] 08/31/2022 9:15 A M Medical Record Number: [id number] Patient Account Number: [id number] Date of Birth/Sex: Treating RN: Apr 18, 1946 (76 y.o. 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/04/2022 No Yes L97.812 Non-pressure chronic ulcer of other part of right lower leg with fat layer 07/14/2022 No Yes exposed I87.313 Chronic venous hypertension (idiopathic) with ulcer of bilateral lower extremity 07/14/2022 No Yes E11.622 Type 2 diabetes mellitus with other skin ulcer 05/25/2022 No Yes T79.8XXA Other early complications of trauma, initial encounter 06/23/2022 No Yes  X32.440N Unspecified open wound, right lower leg, initial encounter 06/23/2022 No Yes S81.802A Unspecified open wound, left lower leg, initial encounter 06/23/2022 No Yes [name], [name] ([id number]) [id number] Page 5 of 10 Inactive Problems Resolved Problems ICD-10 Code Description Active Date Resolved Date W54.8XXA Other contact with dog, initial encounter 05/25/2022 05/25/2022 Electronic Signature(s) Signed: 08/31/2022 1:13:56 PM By: Kalman Shan DO Entered By: Kalman Shan on 08/31/2022 12:30:06 -------------------------------------------------------------------------------- Progress Note Details Patient Name: Date of Service: [name], Brandy [name] 08/31/2022 9:15 A M Medical Record Number: [id number] Patient Account Number: [id number] Date of Birth/Sex: Treating RN: Aug 12, 1946 (76 y.o. F) Primary Care Provider: [name] Other Clinician: Referring Provider: Treating Provider/Extender: Hollie Beach in Treatment: 14 Subjective Chief Complaint Information obtained from Patient 05/25/2022; left lower extremity wound following a dog scratch 06/23/2022; lacerations to the right and left lower extremity status post fall History of Present Illness (HPI) Admission  05/25/2022 Ms. Stephan Draughn is a 76 year old female with a past medical history of diet-controlled type 2 diabetes, chronic 3 stage kidney disease and venous insufficiency that presents to the clinic for a 1 month history of nonhealing ulcer to the left leg. She states that her dog scratched her causing a wound. She has developed cellulitis in this leg and has been treated with clindamycin by her primary care physician. She reports improvement in her symptoms. She currently denies signs of infection. She has been keeping the area covered. She does not wear compression stockings. She is on 80 mg of Lasix twice daily. She has had reflux studies to the left leg on 07/2021 that notes venous reflux throughout the left common femoral vein and greater saphenous vein. She has not had an ablation. She follows with vein and vascular for her venous insufficiency. 8/14; patient presents for follow-up. She states that the wrap stayed on for 4 days and eventually slid down. She has been wearing her compression stocking and doing dressing changes with Hydrofera Blue since. She also states she has a hard time putting on her shoes with the 3 layer compression. She denies signs of infection. 8/21; patient presents for follow-up. She again had trouble with the wrap sliding down. We have been using Hydrofera Blue with gentamicin under 2 layer Coflex. She currently denies signs of infection. 8/28; patient presents for follow-up. She reports taking the wrap off yesterday. We have been using Hydrofera Blue with gentamicin under 2 layer Coflex. She states that the wrap does slide down but remains over the wound. She is currently wearing her compression stocking. She would like to do this instead of the compression wrap. 9/5; patient presents for follow-up. She has been using Medihoney to the left lower extremity remedy wound with her compression stockings. She reports no drainage from the site. Unfortunately she fell over  the weekend and had to go to the ED due to lacerations she experienced on her left and right lower extremity. On the left leg she had 10 sutures placed on the right leg she had 7. She was given Keflex. Today she reports no signs of infection. She has been using Xeroform to the suture sites. 9/11; patient presents for follow-up. She has been using bacitracin ointment to the suture line. She currently denies signs of infection. 9/19; patient presents for follow-up. She has been using Medihoney to the wound beds. She denies signs of infection. 9/26; patient presents for follow-up. She has been using Dakin's wet-to-dry dressings to the wound beds. She reports improvement in wound  healing. She has no issues or complaints today. 10/3; patient presents for follow-up. We had used compression therapy along with Hydrofera Blue to the right lower extremity. She states she felt uncomfortable with the wrap and took it off 3 days ago. She has been using Medihoney without issues to the left lower extremity wound. She denies signs of infection. 10/10; patient presents for follow-up. She has been using Hydrofera Blue and Medihoney to the left lower extremity wound. We have been using Hydrofera Blue and Santyl under compression therapy to the right lower extremity. She states that the wrap slid down 2 days ago and she took it off. She has no issues or complaints today. She denies signs of infection. 10/17; patient presents for follow-up. We have been using Hydrofera Blue to the left lower extremity wounds and Hydrofera Blue and Santyl under [name], [name] ([id number]) [id number] Page 6 of 10 compression therapy to the right lower extremity. She has no issues or complaints today. She denies signs of infection. 10/23; patient presents for follow-up. We have been using silver alginate to the left lower extremity and Hydrofera Blue and Santyl under Kerlix/Coban to the right lower extremity.  It is unclear which she is using to the left leg. She denies signs of infection. She has no issues or complaints today. 10/30; patient presents for follow-up. We have been using Hydrofera Blue and Santyl under Kerlix/Coban to the right lower extremity and silver alginate with Medihoney to the left lower extremity under compression stocking. She has no issues or complaints today. 11/13; patient presents for follow-up. We have been using Hydrofera Blue and Santyl under Kerlix/Coban to the right lower extremity. We have been using Medihoney and silver alginate to the left lower extremity wounds. This area is healed. She has no issues or complaints today. Patient History Information obtained from Patient. Family History Cancer - Siblings, Diabetes - Siblings, Heart Disease - Mother, Hypertension - Mother, No family history of Hereditary Spherocytosis, Kidney Disease, Lung Disease, Seizures, Stroke, Thyroid Problems, Tuberculosis. Social History Never smoker, Marital Status - Married, Alcohol Use - Never, Drug Use - No History, Caffeine Use - Moderate. Medical History Eyes Patient has history of Cataracts Denies history of Glaucoma Ear/Nose/Mouth/Throat Denies history of Chronic sinus problems/congestion, Middle ear problems Hematologic/Lymphatic Patient has history of Anemia Denies history of Hemophilia, Human Immunodeficiency Virus, Lymphedema, Sickle Cell Disease Respiratory Denies history of Aspiration, Asthma, Chronic Obstructive Pulmonary Disease (COPD), Pneumothorax, Sleep Apnea, Tuberculosis Cardiovascular Patient has history of Congestive Heart Failure, Hypertension Denies history of Angina, Arrhythmia, Coronary Artery Disease, Deep Vein Thrombosis, Hypotension, Myocardial Infarction, Peripheral Arterial Disease, Peripheral Venous Disease, Phlebitis, Vasculitis Gastrointestinal Denies history of Cirrhosis , Colitis, Crohnoos, Hepatitis A, Hepatitis [name], Hepatitis C Endocrine Denies  history of Type I Diabetes, Type II Diabetes Genitourinary Denies history of End Stage Renal Disease Immunological Denies history of Lupus Erythematosus, Raynaudoos, Scleroderma Integumentary (Skin) Denies history of History of Burn Musculoskeletal Patient has history of Osteoarthritis Denies history of Gout, Rheumatoid Arthritis, Osteomyelitis Neurologic Denies history of Dementia, Neuropathy, Quadriplegia, Paraplegia, Seizure Disorder Oncologic Denies history of Received Chemotherapy Psychiatric Denies history of Anorexia/bulimia, Confinement Anxiety Hospitalization/Surgery History - right total knee replacement. Medical A Surgical History Notes nd Gastrointestinal diverticulosis hiatal hernia hyperlipidemia Musculoskeletal DJD Objective Constitutional respirations regular, non-labored and within target range for patient.. Vitals Time Taken: 9:49 AM, Height: 63 in, Temperature: 97.9 F, Pulse: 71 bpm, Respiratory Rate: 18 breaths/min, Blood Pressure: 142/84 mmHg. Cardiovascular 2+ dorsalis pedis/posterior tibialis pulses. Psychiatric pleasant and cooperative.  General Notes: Right lower extremity: Nonviable tissue and granulation tissue in the wound bed. 2+ pitting edema to the knee. Left lower extremity: [name], [name] ([id number]) [id number] Page 7 of 10 Epithelization to the previous wound site Integumentary (Hair, Skin) Wound #2 status is Healed - Epithelialized. Original cause of wound was Trauma. The date acquired was: 06/20/2022. The wound has been in treatment 9 weeks. The wound is located on the Left,Lateral Upper Leg. The wound measures 0cm length x 0cm width x 0cm depth; 0cm^2 area and 0cm^3 volume. There is no tunneling or undermining noted. There is a none present amount of drainage noted. The wound margin is distinct with the outline attached to the wound base. There is no granulation within the wound bed. There is no necrotic tissue  within the wound bed. The periwound skin appearance had no abnormalities noted for texture. The periwound skin appearance had no abnormalities noted for moisture. The periwound skin appearance had no abnormalities noted for color. Periwound temperature was noted as No Abnormality. Wound #3 status is Open. Original cause of wound was Trauma. The date acquired was: 06/20/2022. The wound has been in treatment 9 weeks. The wound is located on the Right,Lateral Lower Leg. The wound measures 1.6cm length x 2.4cm width x 0.4cm depth; 3.016cm^2 area and 1.206cm^3 volume. There is Fat Layer (Subcutaneous Tissue) exposed. There is no tunneling or undermining noted. There is a medium amount of serosanguineous drainage noted. The wound margin is distinct with the outline attached to the wound base. There is large (67-100%) red, pink granulation within the wound bed. There is a small (1-33%) amount of necrotic tissue within the wound bed including Adherent Slough. The periwound skin appearance had no abnormalities noted for texture. The periwound skin appearance had no abnormalities noted for moisture. The periwound skin appearance exhibited: Hemosiderin Staining. The periwound skin appearance did not exhibit: Atrophie Blanche, Cyanosis, Ecchymosis, Mottled, Pallor, Rubor, Erythema. Periwound temperature was noted as No Abnormality. General Notes: Weeping around [place] noted. Assessment Active Problems ICD-10 Non-pressure chronic ulcer of other part of left lower leg with fat layer exposed Non-pressure chronic ulcer of other part of right lower leg with fat layer exposed Chronic venous hypertension (idiopathic) with ulcer of bilateral lower extremity Type 2 diabetes mellitus with other skin ulcer Other early complications of trauma, initial encounter Unspecified open wound, right lower leg, initial encounter Unspecified open wound, left lower leg, initial encounter Patient has done well with silver alginate  and Medihoney to the left lower extremity. Her wound has healed. I recommended her compression stocking daily to this leg. The right lower extremity wound appears well-healing. I debrided nonviable tissue and recommended continuing the course with Santyl and Hydrofera Blue under compression therapy. Follow-up in 1 week. Procedures Wound #3 Pre-procedure diagnosis of Wound #3 is a Trauma, Other located on the Right,Lateral Lower Leg . There was a Excisional Skin/Subcutaneous Tissue Debridement with a total area of 3.84 sq cm performed by Kalman Shan, DO. With the following instrument(s): Curette to remove Viable and Non-Viable tissue/material. Material removed includes Subcutaneous Tissue and Slough and after achieving pain control using Lidocaine. No specimens were taken. A time out was conducted at 10:29, prior to the start of the procedure. A Minimum amount of bleeding was controlled with Pressure. The procedure was tolerated well with a pain level of 0 throughout and a pain level of 0 following the procedure. Post Debridement Measurements: 1.6cm length x 2.4cm width x 0.4cm depth; 1.206cm^3 volume. Character of Wound/Ulcer  Post Debridement is improved. Post procedure Diagnosis Wound #3: Same as Pre-Procedure Plan Follow-up Appointments: Return Appointment in 1 week. - w/ Dr. Heber Tonsina Anesthetic: (In clinic) Topical Lidocaine 5% applied to wound bed Bathing/ Shower/ Hygiene: May shower with protection but do not get wound dressing(s) wet. Edema Control - Lymphedema / SCD / Other: Elevate legs to the level of the heart or above for 30 minutes daily and/or when sitting, a frequency of: - 2-3 times a day throughout the day. Avoid standing for long periods of time. Exercise regularly Moisturize legs daily. - lotion both legs every night before bed. Compression stocking or Garment 20-30 mm/Hg pressure to: - apply in the morning and remove at night. WOUND #3: - Lower Leg Wound Laterality:  Right, Lateral Cleanser: Soap and Water 1 x Per Week/30 Days Discharge Instructions: May shower and wash wound with dial antibacterial soap and water prior to dressing change. Peri-Wound Care: Sween Lotion (Moisturizing lotion) 1 x Per Week/30 Days Discharge Instructions: Apply moisturizing lotion as directed Prim Dressing: Hydrofera Blue Classic Foam, 4x4 in 1 x Per Week/30 Days ary Discharge Instructions: Moisten with saline prior to applying to wound bed Prim Dressing: Santyl Ointment 1 x Per Week/30 Days ary Discharge Instructions: Apply nickel thick amount to wound bed as instructed Secondary Dressing: Woven Gauze Sponges 2x2 in 1 x Per Week/30 Days Discharge Instructions: Apply over primary dressing as directed. Secondary Dressing: Zetuvit Plus 4x8 in 1 x Per Week/30 Days [name], [name] ([id number]) [id number] Page 8 of 10 Discharge Instructions: Apply over primary dressing as directed. Compression Wrap: Kerlix Roll 4.5x3.1 (in/yd) 1 x Per Week/30 Days Discharge Instructions: Apply Kerlix and Coban compression as directed. Compression Wrap: Coban Self-Adherent Wrap 4x5 (in/yd) 1 x Per Week/30 Days Discharge Instructions: Apply over Kerlix as directed. 1. In office sharp debridement 2. Hydrofera Blue and Santyl under Kerlix/Cobanooright lower extremity 3. Compression stocking to the left lower extremity Electronic Signature(s) Signed: 08/31/2022 1:13:56 PM By: Kalman Shan DO Entered By: Kalman Shan on 08/31/2022 12:39:08 -------------------------------------------------------------------------------- HxROS Details Patient Name: Date of Service: [name], Brandy [name] 08/31/2022 9:15 A M Medical Record Number: [id number] Patient Account Number: [id number] Date of Birth/Sex: Treating RN: 12-29-1945 (76 y.o. F) Primary Care Provider: [name] Other Clinician: Referring Provider: Treating Provider/Extender: Hollie Beach in Treatment: 14 Information Obtained From Patient Eyes Medical History: Positive for: Cataracts Negative for: Glaucoma Ear/Nose/Mouth/Throat Medical History: Negative for: Chronic sinus problems/congestion; Middle ear problems Hematologic/Lymphatic Medical History: Positive for: Anemia Negative for: Hemophilia; Human Immunodeficiency Virus; Lymphedema; Sickle Cell Disease Respiratory Medical History: Negative for: Aspiration; Asthma; Chronic Obstructive Pulmonary Disease (COPD); Pneumothorax; Sleep Apnea; Tuberculosis Cardiovascular Medical History: Positive for: Congestive Heart Failure; Hypertension Negative for: Angina; Arrhythmia; Coronary Artery Disease; Deep Vein Thrombosis; Hypotension; Myocardial Infarction; Peripheral Arterial Disease; Peripheral Venous Disease; Phlebitis; Vasculitis Gastrointestinal Medical History: Negative for: Cirrhosis ; Colitis; Crohns; Hepatitis A; Hepatitis [name]; Hepatitis C Past Medical History Notes: diverticulosis hiatal hernia hyperlipidemia Endocrine Medical History: Negative for: Type I Diabetes; Type II Diabetes [name], SAWDEY [name] ([id number]) [id number] Page 9 of 10 Genitourinary Medical History: Negative for: End Stage Renal Disease Immunological Medical History: Negative for: Lupus Erythematosus; Raynauds; Scleroderma Integumentary (Skin) Medical History: Negative for: History of Burn Musculoskeletal Medical History: Positive for: Osteoarthritis Negative for: Gout; Rheumatoid Arthritis; Osteomyelitis Past Medical History Notes: DJD Neurologic Medical History: Negative for: Dementia; Neuropathy; Quadriplegia; Paraplegia; Seizure Disorder Oncologic Medical History: Negative for: Received Chemotherapy Psychiatric Medical History: Negative for: Anorexia/bulimia; Confinement  Anxiety HBO Extended History Items Eyes: Cataracts Immunizations Pneumococcal Vaccine: Received  Pneumococcal Vaccination: Yes Received Pneumococcal Vaccination On or After 60th Birthday: Yes Implantable Devices None Hospitalization / Surgery History Type of Hospitalization/Surgery right total knee replacement Family and Social History Cancer: Yes - Siblings; Diabetes: Yes - Siblings; Heart Disease: Yes - Mother; Hereditary Spherocytosis: No; Hypertension: Yes - Mother; Kidney Disease: No; Lung Disease: No; Seizures: No; Stroke: No; Thyroid Problems: No; Tuberculosis: No; Never smoker; Marital Status - Married; Alcohol Use: Never; Drug Use: No History; Caffeine Use: Moderate; Financial Concerns: No; Food, Clothing or Shelter Needs: No; Support System Lacking: No; Transportation Concerns: No Electronic Signature(s) Signed: 08/31/2022 1:13:56 PM By: Kalman Shan DO Entered By: Kalman Shan on 08/31/2022 12:31:37 SuperBill Details -------------------------------------------------------------------------------- [name] ([id number]) [id number] Page 10 of 10 Patient Name: Date of Service: [name] 08/31/2022 Medical Record Number: [id number] Patient Account Number: [id number] Date of Birth/Sex: Treating RN: 06/21/1946 (76 y.o. [name], [name] Primary Care Provider: [name] Other Clinician: Referring Provider: Treating Provider/Extender: Hollie Beach in Treatment: 14 Diagnosis Coding ICD-10 Codes Code Description [phone] Non-pressure chronic ulcer of other part of left lower leg with fat layer exposed L97.812 Non-pressure chronic ulcer of other part of right lower leg with fat layer exposed I87.313 Chronic venous hypertension (idiopathic) with ulcer of bilateral lower extremity E11.622 Type 2 diabetes mellitus with other skin ulcer T79.8XXA Other early complications of trauma, initial encounter S81.801A Unspecified open wound, right lower leg, initial encounter S81.802A Unspecified open wound,  left lower leg, initial encounter Facility Procedures CPT4 Code Description Modifier Quantity [id number] - DEB SUBQ TISSUE 20 SQ CM/< 1 ICD-10 Diagnosis Description L97.812 Non-pressure chronic ulcer of other part of right lower leg with fat layer exposed Physician Procedures Quantity CPT4 Code Description Modifier [id number] - WC PHYS SUBQ TISS 20 SQ CM 1 ICD-10 Diagnosis Description U72.536 Non-pressure chronic ulcer of other part of right lower leg with fat layer exposed Electronic Signature(s) Signed: 08/31/2022 1:13:56 PM By: Kalman Shan DO Entered By: Kalman Shan on 08/31/2022 12:39:42

## 2022-09-01 NOTE — Progress Notes (Signed)
RICKELL, WIEHE ([id number]) [id number] Page 1 of 8 Visit Report for 08/31/2022 Arrival Information Details Patient Name: Date of Service: [name] Brandy B. 08/31/2022 9:15 A M Medical Record Number: [id number] Patient Account Number: [id number] Date of Birth/Sex: Treating RN: 1946-09-26 (76 y.o. F) Primary Care Nefertiti Mohamad: [name] Other Clinician: Referring Beatriz Quintela: Treating Chevis Weisensel/Extender: Hollie Beach in Treatment: 14 Visit Information History Since Last Visit Added or deleted any medications: No Patient Arrived: Walker Any new allergies or adverse reactions: No Arrival Time: 09:46 Had a fall or experienced change in No Accompanied By: son activities of daily living that may affect Transfer Assistance: None risk of falls: Patient Identification Verified: Yes Signs or symptoms of abuse/neglect since last visito No Secondary Verification Process Completed: Yes Hospitalized since last visit: No Patient Requires Transmission-Based Precautions: No Implantable device outside of the clinic excluding No Patient Has Alerts: No cellular tissue based products placed in the center since last visit: Has Compression in Place as Prescribed: Yes Pain Present Now: No Electronic Signature(s) Signed: 09/01/2022 3:56:34 PM By: [name] Entered By: [name] on 08/31/2022 09:47:34 -------------------------------------------------------------------------------- Lower Extremity Assessment Details Patient Name: Date of Service: [name] RO Brandy B. 08/31/2022 9:15 A M Medical Record Number: [id number] Patient Account Number: [id number] Date of Birth/Sex: Treating RN: September 22, 1946 (76 y.o. F) Primary Care Grayton Lobo: [name] Other Clinician: Referring Leilynn Pilat: Treating Ladan Vanderzanden/Extender: Hollie Beach in Treatment: 14 Edema Assessment Assessed: [name]: Yes] Patrice Paradise: Yes] Edema: [Left: Yes] [Right:  Yes] Calf Left: Right: Point of Measurement: 41 cm From Medial Instep 45 cm 45.6 cm Ankle Left: Right: Point of Measurement: 8 cm From Medial Instep 26 cm 26 cm Vascular Assessment Pulses: Dorsalis Pedis Palpable: [Left:Yes] [Right:Yes [id number] Page 2 of 8] Electronic Signature(s) Signed: 09/01/2022 3:56:34 PM By: [name] Entered By: [name] on 08/31/2022 09:55:07 -------------------------------------------------------------------------------- Multi Wound Chart Details Patient Name: Date of Service: [name], [place] RO Brandy B. 08/31/2022 9:15 A M Medical Record Number: [id number] Patient Account Number: [id number] Date of Birth/Sex: Treating RN: 1946/08/16 (75 y.o. F) Primary Care Robi Mitter: [name] Other Clinician: Referring Hillard Goodwine: Treating Morganna Styles/Extender: Hollie Beach in Treatment: 14 Vital Signs Height(in): 72 Pulse(bpm): 54 Weight(lbs): Blood Pressure(mmHg): 142/84 Body Mass Index(BMI): Temperature(F): 97.9 Respiratory Rate(breaths/min): 18 [2:Photos:] [N/A:N/A] Left, Lateral Upper Leg Right, Lateral Lower Leg N/A Wound Location: Trauma Trauma N/A Wounding Event: Abrasion Trauma, Other N/A Primary Etiology: Lymphedema N/A N/A Secondary Etiology: Cataracts, Anemia, Congestive Heart Cataracts, Anemia, Congestive Heart N/A Comorbid History: Failure, Hypertension, Osteoarthritis Failure, Hypertension, Osteoarthritis 06/20/2022 06/20/2022 N/A Date Acquired: 9 9 N/A Weeks of Treatment: Healed - Epithelialized Open N/A Wound Status: No No N/A Wound Recurrence: 0x0x0 1.6x2.4x0.4 N/A Measurements L x W x D (cm) 0 3.016 N/A A (cm) : rea 0 1.206 N/A Volume (cm) : 100.00% 92.30% N/A % Reduction in A rea: 100.00% 84.60% N/A % Reduction in Volume: Full Thickness With Exposed Support Full Thickness With Exposed Support N/A Classification: Structures Structures None Present Medium N/A Exudate A  mount: N/A Serosanguineous N/A Exudate Type: N/A red, brown N/A Exudate Color: Distinct, outline attached Distinct, outline attached N/A Wound Margin: None Present (0%) Large (67-100%) N/A Granulation A mount: N/A Red, Pink N/A Granulation Quality: None Present (0%) Small (1-33%) N/A Necrotic A mount: Fascia: No Fat Layer (Subcutaneous Tissue): Yes N/A Exposed Structures: Fat Layer (Subcutaneous Tissue): No Fascia: No Tendon: No Tendon: No Muscle: No Muscle: No Joint: No Joint: No Bone: No  Bone: No Large (67-100%) Medium (34-66%) N/A Epithelialization: N/A Debridement - Excisional N/A Debridement: Pre-procedure Verification/Time Out N/A 10:29 N/A Taken: N/A Lidocaine N/A Pain Control: N/A Subcutaneous, Slough N/A Tissue Debrided: N/A Skin/Subcutaneous Tissue N/A Level: N/A 3.84 N/A Debridement A (sq cm): rea N/A Curette N/A Instrument: N/A Minimum N/A Bleeding: ERIKKA, FOLLMER ([id number]) [id number] Page 3 of 8 N/A Pressure N/A Hemostasis Achieved: N/A 0 N/A Procedural Pain: N/A 0 N/A Post Procedural Pain: N/A Procedure was tolerated well N/A Debridement Treatment Response: N/A 1.6x2.4x0.4 N/A Post Debridement Measurements L x W x D (cm) N/A 1.206 N/A Post Debridement Volume: (cm) Excoriation: No Excoriation: No N/A Periwound Skin Texture: Induration: No Induration: No Callus: No Callus: No Crepitus: No Crepitus: No Rash: No Rash: No Scarring: No Scarring: No Maceration: No Maceration: No N/A Periwound Skin Moisture: Dry/Scaly: No Dry/Scaly: No Hemosiderin Staining: Yes Hemosiderin Staining: Yes N/A Periwound Skin Color: Atrophie Blanche: No Atrophie Blanche: No Cyanosis: No Cyanosis: No Ecchymosis: No Ecchymosis: No Erythema: No Erythema: No Mottled: No Mottled: No Pallor: No Pallor: No Rubor: No Rubor: No No Abnormality No Abnormality N/A Temperature: N/A Weeping around Periwound noted.  N/A Assessment Notes: N/A Debridement N/A Procedures Performed: Treatment Notes Electronic Signature(s) Signed: 08/31/2022 1:13:56 PM By: Kalman Shan DO Entered By: Kalman Shan on 08/31/2022 12:30:12 -------------------------------------------------------------------------------- Multi-Disciplinary Care Plan Details Patient Name: Date of Service: [name], CA RO Brandy B. 08/31/2022 9:15 A M Medical Record Number: [id number] Patient Account Number: [id number] Date of Birth/Sex: Treating RN: [birth year] (76 y.o. [name], [name] Primary Care Cabela Pacifico: [name] Other Clinician: Referring Adaleigh Warf: Treating Mliss Wedin/Extender: Hollie Beach in Treatment: 14 Active Inactive Pain, Acute or Chronic Nursing Diagnoses: Pain, acute or chronic: actual or potential Potential alteration in comfort, pain Goals: Patient will verbalize adequate pain control and receive pain control interventions during procedures as needed Date Initiated: 05/25/2022 Target Resolution Date: 09/19/2022 Goal Status: Active Patient/caregiver will verbalize comfort level met Date Initiated: 05/25/2022 Target Resolution Date: 09/19/2022 Goal Status: Active Interventions: Complete pain assessment as per visit requirements Encourage patient to take pain medications as prescribed Provide education on pain management Treatment Activities: Administer pain control measures as ordered : 05/25/2022 Notes: COBI, ALDAPE ([id number]) [id number] Page 4 of 8 Venous Leg Ulcer Nursing Diagnoses: Knowledge deficit related to disease process and management Goals: Non-invasive venous studies are completed as ordered Date Initiated: 05/25/2022 Target Resolution Date: 09/19/2022 Goal Status: Active Interventions: Assess peripheral edema status every visit. Provide education on venous insufficiency Treatment Activities: Non-invasive vascular studies :  05/25/2022 Notes: Wound/Skin Impairment Nursing Diagnoses: Knowledge deficit related to ulceration/compromised skin integrity Goals: Patient/caregiver will verbalize understanding of skin care regimen Date Initiated: 05/25/2022 Target Resolution Date: 09/19/2022 Goal Status: Active Ulcer/skin breakdown will heal within 14 weeks Date Initiated: 05/25/2022 Target Resolution Date: 10/17/2022 Goal Status: Active Interventions: Assess patient/caregiver ability to obtain necessary supplies Assess patient/caregiver ability to perform ulcer/skin care regimen upon admission and as needed Provide education on ulcer and skin care Treatment Activities: Skin care regimen initiated : 05/25/2022 Topical wound management initiated : 05/25/2022 Notes: Electronic Signature(s) Signed: 08/31/2022 4:10:29 PM By: Rhae Hammock RN Entered By: Rhae Hammock on 08/31/2022 10:17:02 -------------------------------------------------------------------------------- Pain Assessment Details Patient Name: Date of Service: [name], [place] RO Brandy B. 08/31/2022 9:15 A M Medical Record Number: [id number] Patient Account Number: [id number] Date of Birth/Sex: Treating RN: January 20, 1946 (76 y.o. F) Primary Care Nuh Lipton: [name] Other Clinician: Referring Ellyson Rarick: Treating Ashyla Luth/Extender: Hollie Beach in Treatment: [phone]  Active Problems Location of Pain Severity and Description of Pain Patient Has Paino No Site Locations BETSEY, SOSSAMON B ([id number]) [id number] Page 5 of 8 Pain Management and Medication Current Pain Management: Electronic Signature(s) Signed: 09/01/2022 3:56:34 PM By: [name] Entered By: [name] on 08/31/2022 09:47:45 -------------------------------------------------------------------------------- Patient/Caregiver Education Details Patient Name: Date of Service: [name], [place] RO Brandy B. 11/13/2023andnbsp9:15 A M Medical Record Number:  [id number] Patient Account Number: [id number] Date of Birth/Gender: Treating RN: [birth year] (76 y.o. [name], [name] Primary Care Physician: [name] Other Clinician: Referring Physician: Treating Physician/Extender: Hollie Beach in Treatment: 14 Education Assessment Education Provided To: Patient Education Topics Provided Wound/Skin Impairment: Methods: Explain/Verbal Responses: Reinforcements needed, State content correctly [employer]) Signed: 08/31/2022 4:10:29 PM By: Rhae Hammock RN Entered By: Rhae Hammock on 08/31/2022 10:17:23 -------------------------------------------------------------------------------- Wound Assessment Details Patient Name: Date of Service: [name], [place] RO Brandy B. 08/31/2022 9:15 A M Medical Record Number: [id number] Patient Account Number: [id number] Date of Birth/Sex: Treating RN: 12-Sep-1946 (76 y.o. [name], [name] Primary Care Jorden Minchey: [name] Other Clinician: Referring Jabes Primo: Treating Yunus Stoklosa/Extender: Devlyn, Parish, Lanora Manis ([id number]) [id number] Page 6 of 8 Weeks in Treatment: 14 Wound Status Wound Number: 2 Primary Abrasion Etiology: Wound Location: Left, Lateral Upper Leg Secondary Lymphedema Wounding Event: Trauma Etiology: Date Acquired: 06/20/2022 Wound Status: Healed - Epithelialized Weeks Of Treatment: 9 Comorbid Cataracts, Anemia, Congestive Heart Failure, Hypertension, Clustered Wound: No History: Osteoarthritis Photos Wound Measurements Length: (cm) Width: (cm) Depth: (cm) Area: (cm) Volume: (cm) 0 % Reduction in Area: 100% 0 % Reduction in Volume: 100% 0 Epithelialization: Large (67-100%) 0 Tunneling: No 0 Undermining: No Wound Description Classification: Full Thickness With Exposed Support Structures Wound Margin: Distinct, outline attached Exudate Amount: None Present Foul Odor After Cleansing:  No Slough/Fibrino No Wound Bed Granulation Amount: None Present (0%) Exposed Structure Necrotic Amount: None Present (0%) Fascia Exposed: No Fat Layer (Subcutaneous Tissue) Exposed: No Tendon Exposed: No Muscle Exposed: No Joint Exposed: No Bone Exposed: No Periwound Skin Texture Texture Color No Abnormalities Noted: Yes No Abnormalities Noted: Yes Moisture Temperature / Pain No Abnormalities Noted: Yes Temperature: No Abnormality Electronic Signature(s) Signed: 08/31/2022 4:10:29 PM By: Rhae Hammock RN Entered By: Rhae Hammock on 08/31/2022 10:28:19 -------------------------------------------------------------------------------- Wound Assessment Details Patient Name: Date of Service: [name], [place] RO Brandy B. 08/31/2022 9:15 A M Medical Record Number: [id number] Patient Account Number: [id number] Date of Birth/Sex: Treating RN: [birth year] (76 y.o. F) Primary Care Carmine Carrozza: [name] Other Clinician: Referring Obbie Lewallen: Treating Sedalia Greeson/Extender: Hollie Beach in Treatment: [address] [place] B ([id number]) [id number] Page 7 of 8 Wound Status Wound Number: 3 Primary Trauma, Other Etiology: Wound Location: Right, Lateral Lower Leg Wound Status: Open Wounding Event: Trauma Comorbid Cataracts, Anemia, Congestive Heart Failure, Hypertension, Date Acquired: 06/20/2022 History: Osteoarthritis Weeks Of Treatment: 9 Clustered Wound: No Photos Wound Measurements Length: (cm) 1.6 Width: (cm) 2.4 Depth: (cm) 0.4 Area: (cm) 3.016 Volume: (cm) 1.206 % Reduction in Area: 92.3% % Reduction in Volume: 84.6% Epithelialization: Medium (34-66%) Tunneling: No Undermining: No Wound Description Classification: Full Thickness With Exposed Support Structures Wound Margin: Distinct, outline attached Exudate Amount: Medium Exudate Type: Serosanguineous Exudate Color: red, brown Foul Odor After Cleansing: No Slough/Fibrino  Yes Wound Bed Granulation Amount: Large (67-100%) Exposed Structure Granulation Quality: Red, Pink Fascia Exposed: No Necrotic Amount: Small (1-33%) Fat Layer (Subcutaneous Tissue) Exposed: Yes Necrotic Quality: Adherent Slough Tendon Exposed: No Muscle Exposed: No Joint Exposed: No Bone Exposed: No  Periwound Skin Texture Texture Color No Abnormalities Noted: Yes No Abnormalities Noted: No Atrophie Blanche: No Moisture Cyanosis: No No Abnormalities Noted: Yes Ecchymosis: No Erythema: No Hemosiderin Staining: Yes Mottled: No Pallor: No Rubor: No Temperature / Pain Temperature: No Abnormality Assessment Notes Weeping around Periwound noted. Electronic Signature(s) Signed: 09/01/2022 3:56:34 PM By: [name] Entered By: [name] on 08/31/2022 09:56:33 Thomasene Mohair ([id number]) [id number] Page 8 of 8 -------------------------------------------------------------------------------- Vitals Details Patient Name: Date of Service: [name], [name] Brandy B. 08/31/2022 9:15 A M Medical Record Number: [id number] Patient Account Number: [id number] Date of Birth/Sex: Treating RN: 1946-03-28 (76 y.o. F) Primary Care Yosgart Pavey: [name] Other Clinician: Referring Jaylaa Gallion: Treating Dreana Britz/Extender: Hollie Beach in Treatment: 14 Vital Signs Time Taken: 09:49 Temperature (F): 97.9 Height (in): 63 Pulse (bpm): 71 Respiratory Rate (breaths/min): 18 Blood Pressure (mmHg): 142/84 Reference Range: 80 - 120 mg / dl Electronic Signature(s) Signed: 09/01/2022 3:56:34 PM By: [name] Entered By: [name] on 08/31/2022 09:49:53

## 2022-09-07 ENCOUNTER — Encounter (HOSPITAL_BASED_OUTPATIENT_CLINIC_OR_DEPARTMENT_OTHER): Payer: Medicare HMO | Admitting: Internal Medicine

## 2022-09-07 DIAGNOSIS — L97812 Non-pressure chronic ulcer of other part of right lower leg with fat layer exposed: Secondary | ICD-10-CM

## 2022-09-07 DIAGNOSIS — N189 Chronic kidney disease, unspecified: Secondary | ICD-10-CM | POA: Diagnosis not present

## 2022-09-07 DIAGNOSIS — E11622 Type 2 diabetes mellitus with other skin ulcer: Secondary | ICD-10-CM | POA: Diagnosis not present

## 2022-09-07 DIAGNOSIS — I509 Heart failure, unspecified: Secondary | ICD-10-CM | POA: Diagnosis not present

## 2022-09-07 DIAGNOSIS — I872 Venous insufficiency (chronic) (peripheral): Secondary | ICD-10-CM | POA: Diagnosis not present

## 2022-09-07 DIAGNOSIS — L97822 Non-pressure chronic ulcer of other part of left lower leg with fat layer exposed: Secondary | ICD-10-CM | POA: Diagnosis not present

## 2022-09-07 DIAGNOSIS — I130 Hypertensive heart and chronic kidney disease with heart failure and stage 1 through stage 4 chronic kidney disease, or unspecified chronic kidney disease: Secondary | ICD-10-CM | POA: Diagnosis not present

## 2022-09-07 DIAGNOSIS — E1122 Type 2 diabetes mellitus with diabetic chronic kidney disease: Secondary | ICD-10-CM | POA: Diagnosis not present

## 2022-09-07 DIAGNOSIS — M1990 Unspecified osteoarthritis, unspecified site: Secondary | ICD-10-CM | POA: Diagnosis not present

## 2022-09-07 NOTE — Progress Notes (Signed)
HATSUMI, STEINHART ([id number]) [id number] Page 1 of 10 Visit Report for 09/07/2022 Chief Complaint Document Details Patient Name: Date of Service: [name] [name]. 09/07/2022 8:45 A M Medical Record Number: [id number] Patient Account Number: [id number] Date of Birth/Sex: Treating RN: 1945/11/11 (76 y.o. F) Primary Care Provider: [name] Other Clinician: Referring Provider: Treating Provider/Extender: Hollie Beach in Treatment: 15 Information Obtained from: Patient Chief Complaint 05/25/2022; left lower extremity wound following a dog scratch 06/23/2022; lacerations to the right and left lower extremity status post fall Electronic Signature(s) Signed: 09/07/2022 11:34:26 AM By: Kalman Shan DO Entered By: Kalman Shan on 09/07/2022 10:10:41 -------------------------------------------------------------------------------- Debridement Details Patient Name: Date of Service: [name], Brandy RO [name]. 09/07/2022 8:45 A M Medical Record Number: [id number] Patient Account Number: [id number] Date of Birth/Sex: Treating RN: 1946-03-18 (76 y.o. [name], [name] Primary Care Provider: [name] Other Clinician: Referring Provider: Treating Provider/Extender: Hollie Beach in Treatment: 15 Debridement Performed for Assessment: Wound #3 Right,Lateral Lower Leg Performed By: Physician Kalman Shan, DO Debridement Type: Debridement Level of Consciousness (Pre-procedure): Awake and Alert Pre-procedure Verification/Time Out Yes - 10:05 Taken: Start Time: 10:05 Pain Control: Lidocaine T Area Debrided (L x W): otal 1.6 (cm) x 2 (cm) = 3.2 (cm) Tissue and other material debrided: Viable, Non-Viable, Slough, Subcutaneous, Slough Level: Skin/Subcutaneous Tissue Debridement Description: Excisional Instrument: Curette Bleeding: Minimum Hemostasis Achieved: Pressure End Time: 10:05 Procedural Pain: 0 Post  Procedural Pain: 0 Response to Treatment: Procedure was tolerated well Level of Consciousness (Post- Awake and Alert procedure): Post Debridement Measurements of Total Wound Length: (cm) 1.6 Width: (cm) 2 Depth: (cm) 0.4 Volume: (cm) 1.005 Character of Wound/Ulcer Post Debridement: Improved [name], [name] ([id number]) [id number] Page 2 of 10 Post Procedure Diagnosis Same as Pre-procedure Electronic Signature(s) Signed: 09/07/2022 11:34:26 AM By: Kalman Shan DO Signed: 09/07/2022 4:45:06 PM By: Rhae Hammock RN Entered By: Rhae Hammock on 09/07/2022 10:10:05 -------------------------------------------------------------------------------- HPI Details Patient Name: Date of Service: [name], Brandy RO [name]. 09/07/2022 8:45 A M Medical Record Number: [id number] Patient Account Number: [id number] Date of Birth/Sex: Treating RN: 1946/10/16 (76 y.o. F) Primary Care Provider: [name] Other Clinician: Referring Provider: Treating Provider/Extender: Hollie Beach in Treatment: 15 History of Present Illness HPI Description: Admission 05/25/2022 [name] is a 76 year old female with a past medical history of diet-controlled type 2 diabetes, chronic 3 stage kidney disease and venous insufficiency that presents to the clinic for a 1 month history of nonhealing ulcer to the left leg. She states that her dog scratched her causing a wound. She has developed cellulitis in this leg and has been treated with clindamycin by her primary care physician. She reports improvement in her symptoms. She currently denies signs of infection. She has been keeping the area covered. She does not wear compression stockings. She is on 80 mg of Lasix twice daily. She has had reflux studies to the left leg on 07/2021 that notes venous reflux throughout the left common femoral vein and greater saphenous vein. She has not had an ablation. She  follows with vein and vascular for her venous insufficiency. 8/14; patient presents for follow-up. She states that the wrap stayed on for 4 days and eventually slid down. She has been wearing her compression stocking and doing dressing changes with Hydrofera Blue since. She also states she has a hard time putting on her shoes with the 3 layer compression. She denies signs of infection. 8/21; patient  presents for follow-up. She again had trouble with the wrap sliding down. We have been using Hydrofera Blue with gentamicin under 2 layer Coflex. She currently denies signs of infection. 8/28; patient presents for follow-up. She reports taking the wrap off yesterday. We have been using Hydrofera Blue with gentamicin under 2 layer Coflex. She states that the wrap does slide down but remains over the wound. She is currently wearing her compression stocking. She would like to do this instead of the compression wrap. 9/5; patient presents for follow-up. She has been using Medihoney to the left lower extremity remedy wound with her compression stockings. She reports no drainage from the site. Unfortunately she fell over the weekend and had to go to the ED due to lacerations she experienced on her left and right lower extremity. On the left leg she had 10 sutures placed on the right leg she had 7. She was given Keflex. Today she reports no signs of infection. She has been using Xeroform to the suture sites. 9/11; patient presents for follow-up. She has been using bacitracin ointment to the suture line. She currently denies signs of infection. 9/19; patient presents for follow-up. She has been using Medihoney to the wound beds. She denies signs of infection. 9/26; patient presents for follow-up. She has been using Dakin's wet-to-dry dressings to the wound beds. She reports improvement in wound healing. She has no issues or complaints today. 10/3; patient presents for follow-up. We had used compression therapy  along with Hydrofera Blue to the right lower extremity. She states she felt uncomfortable with the wrap and took it off 3 days ago. She has been using Medihoney without issues to the left lower extremity wound. She denies signs of infection. 10/10; patient presents for follow-up. She has been using Hydrofera Blue and Medihoney to the left lower extremity wound. We have been using Hydrofera Blue and Santyl under compression therapy to the right lower extremity. She states that the wrap slid down 2 days ago and she took it off. She has no issues or complaints today. She denies signs of infection. 10/17; patient presents for follow-up. We have been using Hydrofera Blue to the left lower extremity wounds and Hydrofera Blue and Santyl under compression therapy to the right lower extremity. She has no issues or complaints today. She denies signs of infection. 10/23; patient presents for follow-up. We have been using silver alginate to the left lower extremity and Hydrofera Blue and Santyl under Kerlix/Coban to the right lower extremity. It is unclear which she is using to the left leg. She denies signs of infection. She has no issues or complaints today. 10/30; patient presents for follow-up. We have been using Hydrofera Blue and Santyl under Kerlix/Coban to the right lower extremity and silver alginate with Medihoney to the left lower extremity under compression stocking. She has no issues or complaints today. 11/13; patient presents for follow-up. We have been using Hydrofera Blue and Santyl under Kerlix/Coban to the right lower extremity. We have been using Medihoney and silver alginate to the left lower extremity wounds. This area is healed. She has no issues or complaints today. 11/20; patient presents for follow-up. We have been using Hydrofera Blue and Santyl under Kerlix/Coban to the right lower extremity. She states that she took the wrap off yesterday Due to the drainage coming through from the  wound bed. Electronic Signature(s) [name], [name] ([id number]) [id number] Page 3 of 10 Signed: 09/07/2022 11:34:26 AM By: Kalman Shan DO Entered By: Kalman Shan on  09/07/2022 10:11:35 -------------------------------------------------------------------------------- Physical Exam Details Patient Name: Date of Service: [name] [name]. 09/07/2022 8:45 A M Medical Record Number: [id number] Patient Account Number: [id number] Date of Birth/Sex: Treating RN: 25-Mar-1946 (76 y.o. F) Primary Care Provider: [name] Other Clinician: Referring Provider: Treating Provider/Extender: Hollie Beach in Treatment: 15 Constitutional respirations regular, non-labored and within target range for patient.. Cardiovascular 2+ dorsalis pedis/posterior tibialis pulses. Psychiatric pleasant and cooperative. Notes Right lower extremity: Nonviable tissue and granulation tissue in the wound bed. 2+ pitting edema to the knee. Electronic Signature(s) Signed: 09/07/2022 11:34:26 AM By: Kalman Shan DO Entered By: Kalman Shan on 09/07/2022 10:12:06 -------------------------------------------------------------------------------- Physician Orders Details Patient Name: Date of Service: [name], Brandy RO [name]. 09/07/2022 8:45 A M Medical Record Number: [id number] Patient Account Number: [id number] Date of Birth/Sex: Treating RN: 13-Jul-1946 (76 y.o. [name], [name] Primary Care Provider: [name] Other Clinician: Referring Provider: Treating Provider/Extender: Hollie Beach in Treatment: 15 Verbal / Phone Orders: No Diagnosis Coding Follow-up Appointments ppointment in 1 week. - w/ Dr. Heber [place] Return A Anesthetic (In clinic) Topical Lidocaine 5% applied to wound bed Bathing/ Shower/ Hygiene May shower with protection but do not get wound dressing(s) wet. Edema Control - Lymphedema / SCD / Other Elevate  legs to the level of the heart or above for 30 minutes daily and/or when sitting, a frequency of: - 2-3 times a day throughout the day. Avoid standing for long periods of time. Exercise regularly Moisturize legs daily. - lotion both legs every night before bed. Compression stocking or Garment 20-30 mm/Hg pressure to: - apply in the morning and remove at night. Wound Treatment Wound #3 - Lower Leg Wound Laterality: Right, Lateral Cleanser: Soap and Water 1 x Per Week/30 Days NOLIE, BIGNELL ([id number]) [id number] Page 4 of 10 Discharge Instructions: May shower and wash wound with dial antibacterial soap and water prior to dressing change. Peri-Wound Care: Sween Lotion (Moisturizing lotion) 1 x Per Week/30 Days Discharge Instructions: Apply moisturizing lotion as directed Topical: Gentamicin 1 x Per Week/30 Days Discharge Instructions: As directed by physician Topical: Mupirocin Ointment 1 x Per Week/30 Days Discharge Instructions: Apply Mupirocin (Bactroban) as instructed Prim Dressing: KerraCel Ag Gelling Fiber Dressing, 4x5 in (silver alginate) 1 x Per Week/30 Days ary Discharge Instructions: Apply silver alginate to wound bed as instructed Secondary Dressing: Woven Gauze Sponges 2x2 in 1 x Per Week/30 Days Discharge Instructions: Apply over primary dressing as directed. Secondary Dressing: Zetuvit Plus 4x8 in 1 x Per Week/30 Days Discharge Instructions: Apply over primary dressing as directed. Compression Wrap: Kerlix Roll 4.5x3.1 (in/yd) 1 x Per Week/30 Days Discharge Instructions: Apply Kerlix and Coban compression as directed. Compression Wrap: Coban Self-Adherent Wrap 4x5 (in/yd) 1 x Per Week/30 Days Discharge Instructions: Apply over Kerlix as directed. Electronic Signature(s) Signed: 09/07/2022 11:34:26 AM By: Kalman Shan DO Entered By: Kalman Shan on 09/07/2022  10:12:17 -------------------------------------------------------------------------------- Problem List Details Patient Name: Date of Service: [name], Brandy RO [name]. 09/07/2022 8:45 A M Medical Record Number: [id number] Patient Account Number: [id number] Date of Birth/Sex: Treating RN: 1946/10/15 (76 y.o. 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/04/2022 No Yes L97.812 Non-pressure chronic ulcer of other part of right lower leg with fat layer 07/14/2022 No  Yes exposed I87.313 Chronic venous hypertension (idiopathic) with ulcer of bilateral lower extremity 07/14/2022 No Yes E11.622 Type 2 diabetes mellitus with other skin ulcer 05/25/2022 No Yes T79.8XXA Other early complications of trauma, initial encounter 06/23/2022 No Yes S81.801A Unspecified open wound, right lower leg, initial encounter 06/23/2022 No Yes KHADEEJA, ELDEN [name] ([id number]) [id number] Page 5 of 10 [phone] Unspecified open wound, left lower leg, initial encounter 06/23/2022 No Yes Inactive Problems Resolved Problems ICD-10 Code Description Active Date Resolved Date W54.8XXA Other contact with dog, initial encounter 05/25/2022 05/25/2022 Electronic Signature(s) Signed: 09/07/2022 11:34:26 AM By: Kalman Shan DO Entered By: Kalman Shan on 09/07/2022 10:10:29 -------------------------------------------------------------------------------- Progress Note Details Patient Name: Date of Service: [name], Brandy RO [name]. 09/07/2022 8:45 A M Medical Record Number: [id number] Patient Account Number: [id number] Date of Birth/Sex: Treating RN: 1946/04/23 (76 y.o. F) Primary Care Provider: [name] Other Clinician: Referring  Provider: Treating Provider/Extender: Hollie Beach in Treatment: 15 Subjective Chief Complaint Information obtained from Patient 05/25/2022; left lower extremity wound following a dog scratch 06/23/2022; lacerations to the right and left lower extremity status post fall History of Present Illness (HPI) Admission 05/25/2022 [name] is a 76 year old female with a past medical history of diet-controlled type 2 diabetes, chronic 3 stage kidney disease and venous insufficiency that presents to the clinic for a 1 month history of nonhealing ulcer to the left leg. She states that her dog scratched her causing a wound. She has developed cellulitis in this leg and has been treated with clindamycin by her primary care physician. She reports improvement in her symptoms. She currently denies signs of infection. She has been keeping the area covered. She does not wear compression stockings. She is on 80 mg of Lasix twice daily. She has had reflux studies to the left leg on 07/2021 that notes venous reflux throughout the left common femoral vein and greater saphenous vein. She has not had an ablation. She follows with vein and vascular for her venous insufficiency. 8/14; patient presents for follow-up. She states that the wrap stayed on for 4 days and eventually slid down. She has been wearing her compression stocking and doing dressing changes with Hydrofera Blue since. She also states she has a hard time putting on her shoes with the 3 layer compression. She denies signs of infection. 8/21; patient presents for follow-up. She again had trouble with the wrap sliding down. We have been using Hydrofera Blue with gentamicin under 2 layer Coflex. She currently denies signs of infection. 8/28; patient presents for follow-up. She reports taking the wrap off yesterday. We have been using Hydrofera Blue with gentamicin under 2 layer Coflex. She states that the wrap does slide down but  remains over the wound. She is currently wearing her compression stocking. She would like to do this instead of the compression wrap. 9/5; patient presents for follow-up. She has been using Medihoney to the left lower extremity remedy wound with her compression stockings. She reports no drainage from the site. Unfortunately she fell over the weekend and had to go to the ED due to lacerations she experienced on her left and right lower extremity. On the left leg she had 10 sutures placed on the right leg she had 7. She was given Keflex. Today she reports no signs of infection. She has been using Xeroform to the suture sites. 9/11; patient presents for follow-up. She has been using bacitracin ointment to the suture line. She currently denies signs of infection. 9/19;  patient presents for follow-up. She has been using Medihoney to the wound beds. She denies signs of infection. 9/26; patient presents for follow-up. She has been using Dakin's wet-to-dry dressings to the wound beds. She reports improvement in wound healing. She has no issues or complaints today. 10/3; patient presents for follow-up. We had used compression therapy along with Hydrofera Blue to the right lower extremity. She states she felt uncomfortable with the wrap and took it off 3 days ago. She has been using Medihoney without issues to the left lower extremity wound. She denies signs of infection. [name], [name] ([id number]) [id number] Page 6 of 10 10/10; patient presents for follow-up. She has been using Hydrofera Blue and Medihoney to the left lower extremity wound. We have been using Hydrofera Blue and Santyl under compression therapy to the right lower extremity. She states that the wrap slid down 2 days ago and she took it off. She has no issues or complaints today. She denies signs of infection. 10/17; patient presents for follow-up. We have been using Hydrofera Blue to the left lower extremity  wounds and Hydrofera Blue and Santyl under compression therapy to the right lower extremity. She has no issues or complaints today. She denies signs of infection. 10/23; patient presents for follow-up. We have been using silver alginate to the left lower extremity and Hydrofera Blue and Santyl under Kerlix/Coban to the right lower extremity. It is unclear which she is using to the left leg. She denies signs of infection. She has no issues or complaints today. 10/30; patient presents for follow-up. We have been using Hydrofera Blue and Santyl under Kerlix/Coban to the right lower extremity and silver alginate with Medihoney to the left lower extremity under compression stocking. She has no issues or complaints today. 11/13; patient presents for follow-up. We have been using Hydrofera Blue and Santyl under Kerlix/Coban to the right lower extremity. We have been using Medihoney and silver alginate to the left lower extremity wounds. This area is healed. She has no issues or complaints today. 11/20; patient presents for follow-up. We have been using Hydrofera Blue and Santyl under Kerlix/Coban to the right lower extremity. She states that she took the wrap off yesterday Due to the drainage coming through from the wound bed. Patient History Information obtained from Patient. Family History Cancer - Siblings, Diabetes - Siblings, Heart Disease - Mother, Hypertension - Mother, No family history of Hereditary Spherocytosis, Kidney Disease, Lung Disease, Seizures, Stroke, Thyroid Problems, Tuberculosis. Social History Never smoker, Marital Status - Married, Alcohol Use - Never, Drug Use - No History, Caffeine Use - Moderate. Medical History Eyes Patient has history of Cataracts Denies history of Glaucoma Ear/Nose/Mouth/Throat Denies history of Chronic sinus problems/congestion, Middle ear problems Hematologic/Lymphatic Patient has history of Anemia Denies history of Hemophilia, Human Immunodeficiency  Virus, Lymphedema, Sickle Cell Disease Respiratory Denies history of Aspiration, Asthma, Chronic Obstructive Pulmonary Disease (COPD), Pneumothorax, Sleep Apnea, Tuberculosis Cardiovascular Patient has history of Congestive Heart Failure, Hypertension Denies history of Angina, Arrhythmia, Coronary Artery Disease, Deep Vein Thrombosis, Hypotension, Myocardial Infarction, Peripheral Arterial Disease, Peripheral Venous Disease, Phlebitis, Vasculitis Gastrointestinal Denies history of Cirrhosis , Colitis, Crohnoos, Hepatitis A, Hepatitis [name], Hepatitis C Endocrine Denies history of Type I Diabetes, Type II Diabetes Genitourinary Denies history of End Stage Renal Disease Immunological Denies history of Lupus Erythematosus, Raynaudoos, Scleroderma Integumentary (Skin) Denies history of History of Burn Musculoskeletal Patient has history of Osteoarthritis Denies history of Gout, Rheumatoid Arthritis, Osteomyelitis Neurologic Denies history of Dementia, Neuropathy, Quadriplegia, Paraplegia,  Seizure Disorder Oncologic Denies history of Received Chemotherapy Psychiatric Denies history of Anorexia/bulimia, Confinement Anxiety Hospitalization/Surgery History - right total knee replacement. Medical A Surgical History Notes nd Gastrointestinal diverticulosis hiatal hernia hyperlipidemia Musculoskeletal DJD Objective Constitutional respirations regular, non-labored and within target range for patient.. Vitals Time Taken: 9:21 AM, Height: 63 in, Temperature: 98.0 F, Pulse: 80 bpm, Respiratory Rate: 20 breaths/min, Blood Pressure: 139/76 mmHg. [name], [name] ([id number]) [id number] Page 7 of 10 Cardiovascular 2+ dorsalis pedis/posterior tibialis pulses. Psychiatric pleasant and cooperative. General Notes: Right lower extremity: Nonviable tissue and granulation tissue in the wound bed. 2+ pitting edema to the knee. Integumentary (Hair, Skin) Wound #3 status  is Open. Original cause of wound was Trauma. The date acquired was: 06/20/2022. The wound has been in treatment 10 weeks. The wound is located on the Right,Lateral Lower Leg. The wound measures 1.6cm length x 2cm width x 0.4cm depth; 2.513cm^2 area and 1.005cm^3 volume. There is Fat Layer (Subcutaneous Tissue) exposed. There is no tunneling or undermining noted. There is a medium amount of serosanguineous drainage noted. The wound margin is distinct with the outline attached to the wound base. There is large (67-100%) red, pink granulation within the wound bed. There is a small (1-33%) amount of necrotic tissue within the wound bed including Adherent Slough. The periwound skin appearance had no abnormalities noted for texture. The periwound skin appearance had no abnormalities noted for moisture. The periwound skin appearance exhibited: Hemosiderin Staining. The periwound skin appearance did not exhibit: Atrophie Blanche, Cyanosis, Ecchymosis, Mottled, Pallor, Rubor, Erythema. Periwound temperature was noted as No Abnormality. Assessment Active Problems ICD-10 Non-pressure chronic ulcer of other part of left lower leg with fat layer exposed Non-pressure chronic ulcer of other part of right lower leg with fat layer exposed Chronic venous hypertension (idiopathic) with ulcer of bilateral lower extremity Type 2 diabetes mellitus with other skin ulcer Other early complications of trauma, initial encounter Unspecified open wound, right lower leg, initial encounter Unspecified open wound, left lower leg, initial encounter Patient's wound has shown improvement in size and appearance since last clinic visit. I debrided nonviable tissue. No signs of surrounding infection or increased pain. She reports more drainage than usual. Recommended switching the dressing to silver alginate and using antibiotic ointment to address any bioburden. Continue compression therapy. Follow-up in 1 week. Procedures Wound  #3 Pre-procedure diagnosis of Wound #3 is a Trauma, Other located on the Right,Lateral Lower Leg . There was a Excisional Skin/Subcutaneous Tissue Debridement with a total area of 3.2 sq cm performed by Kalman Shan, DO. With the following instrument(s): Curette to remove Viable and Non-Viable tissue/material. Material removed includes Subcutaneous Tissue and Slough and after achieving pain control using Lidocaine. No specimens were taken. A time out was conducted at 10:05, prior to the start of the procedure. A Minimum amount of bleeding was controlled with Pressure. The procedure was tolerated well with a pain level of 0 throughout and a pain level of 0 following the procedure. Post Debridement Measurements: 1.6cm length x 2cm width x 0.4cm depth; 1.005cm^3 volume. Character of Wound/Ulcer Post Debridement is improved. Post procedure Diagnosis Wound #3: Same as Pre-Procedure Plan Follow-up Appointments: Return Appointment in 1 week. - w/ Dr. Heber [place] Anesthetic: (In clinic) Topical Lidocaine 5% applied to wound bed Bathing/ Shower/ Hygiene: May shower with protection but do not get wound dressing(s) wet. Edema Control - Lymphedema / SCD / Other: Elevate legs to the level of the heart or above for 30 minutes daily and/or when sitting,  a frequency of: - 2-3 times a day throughout the day. Avoid standing for long periods of time. Exercise regularly Moisturize legs daily. - lotion both legs every night before bed. Compression stocking or Garment 20-30 mm/Hg pressure to: - apply in the morning and remove at night. WOUND #3: - Lower Leg Wound Laterality: Right, Lateral Cleanser: Soap and Water 1 x Per Week/30 Days Discharge Instructions: May shower and wash wound with dial antibacterial soap and water prior to dressing change. Peri-Wound Care: Sween Lotion (Moisturizing lotion) 1 x Per Week/30 Days Discharge Instructions: Apply moisturizing lotion as directed Topical: Gentamicin 1 x Per  Week/30 Days Discharge Instructions: As directed by physician Topical: Mupirocin Ointment 1 x Per Week/30 Days Discharge Instructions: Apply Mupirocin (Bactroban) as instructed Prim Dressing: KerraCel Ag Gelling Fiber Dressing, 4x5 in (silver alginate) 1 x Per Week/30 Days ary Discharge Instructions: Apply silver alginate to wound bed as instructed Secondary Dressing: Woven Gauze Sponges 2x2 in 1 x Per Week/30 Days [name], [name] ([id number]) [id number] Page 8 of 10 Discharge Instructions: Apply over primary dressing as directed. Secondary Dressing: Zetuvit Plus 4x8 in 1 x Per Week/30 Days Discharge Instructions: Apply over primary dressing as directed. Compression Wrap: Kerlix Roll 4.5x3.1 (in/yd) 1 x Per Week/30 Days Discharge Instructions: Apply Kerlix and Coban compression as directed. Compression Wrap: Coban Self-Adherent Wrap 4x5 (in/yd) 1 x Per Week/30 Days Discharge Instructions: Apply over Kerlix as directed. 1. In office sharp debridement 2. Silver alginate with gentamicin and mupirocin under Kerlix/Coban 3. Follow-up in 1 week Electronic Signature(s) Signed: 09/07/2022 11:34:26 AM By: Kalman Shan DO Entered By: Kalman Shan on 09/07/2022 10:14:23 -------------------------------------------------------------------------------- HxROS Details Patient Name: Date of Service: [name], Brandy RO [name]. 09/07/2022 8:45 A M Medical Record Number: [id number] Patient Account Number: [id number] Date of Birth/Sex: Treating RN: 31-Dec-1945 (76 y.o. F) Primary Care Provider: [name] Other Clinician: Referring Provider: Treating Provider/Extender: Hollie Beach in Treatment: 15 Information Obtained From Patient Eyes Medical History: Positive for: Cataracts Negative for: Glaucoma Ear/Nose/Mouth/Throat Medical History: Negative for: Chronic sinus problems/congestion; Middle ear problems Hematologic/Lymphatic Medical  History: Positive for: Anemia Negative for: Hemophilia; Human Immunodeficiency Virus; Lymphedema; Sickle Cell Disease Respiratory Medical History: Negative for: Aspiration; Asthma; Chronic Obstructive Pulmonary Disease (COPD); Pneumothorax; Sleep Apnea; Tuberculosis Cardiovascular Medical History: Positive for: Congestive Heart Failure; Hypertension Negative for: Angina; Arrhythmia; Coronary Artery Disease; Deep Vein Thrombosis; Hypotension; Myocardial Infarction; Peripheral Arterial Disease; Peripheral Venous Disease; Phlebitis; Vasculitis Gastrointestinal Medical History: Negative for: Cirrhosis ; Colitis; Crohns; Hepatitis A; Hepatitis [name]; Hepatitis C Past Medical History Notes: diverticulosis hiatal hernia hyperlipidemia Endocrine [name], [name] ([id number]) [id number] Page 9 of 10 Medical History: Negative for: Type I Diabetes; Type II Diabetes Genitourinary Medical History: Negative for: End Stage Renal Disease Immunological Medical History: Negative for: Lupus Erythematosus; Raynauds; Scleroderma Integumentary (Skin) Medical History: Negative for: History of Burn Musculoskeletal Medical History: Positive for: Osteoarthritis Negative for: Gout; Rheumatoid Arthritis; Osteomyelitis Past Medical History Notes: DJD Neurologic Medical History: Negative for: Dementia; Neuropathy; Quadriplegia; Paraplegia; Seizure Disorder Oncologic Medical History: Negative for: Received Chemotherapy Psychiatric Medical History: Negative for: Anorexia/bulimia; Confinement Anxiety HBO Extended History Items Eyes: Cataracts Immunizations Pneumococcal Vaccine: Received Pneumococcal Vaccination: Yes Received Pneumococcal Vaccination On or After 60th Birthday: Yes Implantable Devices None Hospitalization / Surgery History Type of Hospitalization/Surgery right total knee replacement Family and Social History Cancer: Yes - Siblings; Diabetes: Yes -  Siblings; Heart Disease: Yes - Mother; Hereditary Spherocytosis: No; Hypertension: Yes - Mother; Kidney Disease: No; Lung Disease:  No; Seizures: No; Stroke: No; Thyroid Problems: No; Tuberculosis: No; Never smoker; Marital Status - Married; Alcohol Use: Never; Drug Use: No History; Caffeine Use: Moderate; Financial Concerns: No; Food, Clothing or Shelter Needs: No; Support System Lacking: No; Transportation Concerns: No Electronic Signature(s) Signed: 09/07/2022 11:34:26 AM By: Kalman Shan DO Entered By: Kalman Shan on 09/07/2022 10:11:44 [name] ([id number]) [id number] Page 10 of 10 -------------------------------------------------------------------------------- SuperBill Details Patient Name: Date of Service: [name] 09/07/2022 Medical Record Number: [id number] Patient Account Number: [id number] Date of Birth/Sex: Treating RN: [birth year] (76 y.o. F) Primary Care Provider: [name] Other Clinician: Referring Provider: Treating Provider/Extender: Hollie Beach in Treatment: 15 Diagnosis Coding ICD-10 Codes Code Description [phone] Non-pressure chronic ulcer of other part of left lower leg with fat layer exposed L97.812 Non-pressure chronic ulcer of other part of right lower leg with fat layer exposed I87.313 Chronic venous hypertension (idiopathic) with ulcer of bilateral lower extremity E11.622 Type 2 diabetes mellitus with other skin ulcer T79.8XXA Other early complications of trauma, initial encounter S81.801A Unspecified open wound, right lower leg, initial encounter S81.802A Unspecified open wound, left lower leg, initial encounter Facility Procedures : CPT4 Code: [id number] Description: [place] TISSUE 20 SQ CM/< ICD-10 Diagnosis Description L97.812 Non-pressure chronic ulcer of other part of right lower leg with fat layer exp Modifier: osed Quantity: 1 Physician Procedures : CPT4 Code  Description Modifier [id number] - WC PHYS SUBQ TISS 20 SQ CM ICD-10 Diagnosis Description O16.073 Non-pressure chronic ulcer of other part of right lower leg with fat layer exposed Quantity: 1 Electronic Signature(s) Signed: 09/07/2022 11:34:26 AM By: Kalman Shan DO Entered By: Kalman Shan on 09/07/2022 10:14:42

## 2022-09-07 NOTE — Progress Notes (Signed)
[name], [name] ([id number]) [id number] Page 1 of 6 Visit Report for 09/07/2022 Arrival Information Details Patient Name: Date of Service: [name] Brandy B. 09/07/2022 8:45 A M Medical Record Number: [id number] Patient Account Number: [id number] Date of Birth/Sex: Treating RN: 12/17/1945 (76 y.o. F) Primary Care Fareed Fung: [name] Other Clinician: Referring Markus Casten: Treating Dillan Lunden/Extender: Hollie Beach in Treatment: 15 Visit Information History Since Last Visit Added or deleted any medications: No Patient Arrived: [name] Any new allergies or adverse reactions: No Arrival Time: 09:21 Had a fall or experienced change in No Accompanied By: son activities of daily living that may affect Transfer Assistance: None risk of falls: Patient Identification Verified: Yes Signs or symptoms of abuse/neglect since last visito No Secondary Verification Process Completed: Yes Hospitalized since last visit: No Patient Requires Transmission-Based Precautions: No Implantable device outside of the clinic excluding No Patient Has Alerts: No cellular tissue based products placed in the center since last visit: Has Dressing in Place as Prescribed: Yes Pain Present Now: No Electronic Signature(s) Signed: 09/07/2022 4:39:11 PM By: [name] Entered By: [name] on 09/07/2022 09:21:48 -------------------------------------------------------------------------------- Lower Extremity Assessment Details Patient Name: Date of Service: [name] RO Brandy B. 09/07/2022 8:45 A M Medical Record Number: [id number] Patient Account Number: [id number] Date of Birth/Sex: Treating RN: 1946-08-03 (76 y.o. F) Primary Care Jurline Folger: [name] Other Clinician: Referring Givanni Staron: Treating Erva Koke/Extender: Hollie Beach in Treatment: 15 Edema Assessment Assessed: [name]: No] [Right: No] Edema: [Left: Ye] [Right:  s] Calf Left: Right: Point of Measurement: 41 cm From Medial Instep 47.3 cm Ankle Left: Right: Point of Measurement: 8 cm From Medial Instep 25.6 cm Vascular Assessment Pulses: Dorsalis Pedis Palpable: [Right:Yes [id number] Page 2 of 6] Electronic Signature(s) Signed: 09/07/2022 4:39:11 PM By: [name] Entered By: [name] on 09/07/2022 09:27:06 -------------------------------------------------------------------------------- Multi Wound Chart Details Patient Name: Date of Service: [name], [place] RO Brandy B. 09/07/2022 8:45 A M Medical Record Number: [id number] Patient Account Number: [id number] Date of Birth/Sex: Treating RN: [birth year] (77 y.o. F) Primary Care [name]: [name] Other Clinician: Referring [name]: Treating [name]/Extender: Hollie Beach in Treatment: 15 Vital Signs Height(in): 63 Pulse(bpm): 80 Weight(lbs): Blood Pressure(mmHg): 139/76 Body Mass Index(BMI): Temperature(F): 98.0 Respiratory Rate(breaths/min): 20 [3:Photos:] [N/A:N/A] Right, Lateral Lower Leg N/A N/A Wound Location: Trauma N/A N/A Wounding Event: Trauma, Other N/A N/A Primary Etiology: Cataracts, Anemia, Congestive Heart N/A N/A Comorbid History: Failure, Hypertension, Osteoarthritis 06/20/2022 N/A N/A Date Acquired: 10 N/A N/A Weeks of Treatment: Open N/A N/A Wound Status: No N/A N/A Wound Recurrence: 1.6x2x0.4 N/A N/A Measurements L x W x D (cm) 2.513 N/A N/A A (cm) : rea 1.005 N/A N/A Volume (cm) : 93.60% N/A N/A % Reduction in A rea: 87.20% N/A N/A % Reduction in Volume: Full Thickness With Exposed Support N/A N/A Classification: Structures Medium N/A N/A Exudate A mount: Serosanguineous N/A N/A Exudate Type: red, brown N/A N/A Exudate Color: Distinct, outline attached N/A N/A Wound Margin: Large (67-100%) N/A N/A Granulation A mount: Red, Pink N/A N/A Granulation Quality: Small (1-33%) N/A  N/A Necrotic A mount: Fat Layer (Subcutaneous Tissue): Yes N/A N/A Exposed Structures: Fascia: No Tendon: No Muscle: No Joint: No Bone: No Medium (34-66%) N/A N/A Epithelialization: Debridement - Excisional N/A N/A Debridement: Pre-procedure Verification/Time Out 10:05 N/A N/A Taken: Lidocaine N/A N/A Pain Control: Subcutaneous, Slough N/A N/A Tissue Debrided: Skin/Subcutaneous Tissue N/A N/A Level: 3.2 N/A N/A Debridement A (sq cm): rea Curette N/A N/A  Instrument: Minimum N/A N/A Bleeding: Pressure N/A N/A Hemostasis A chievedANALIS, DISTLER ([id number]) [id number] Page 3 of 6 0 N/A N/A Procedural Pain: 0 N/A N/A Post Procedural Pain: Procedure was tolerated well N/A N/A Debridement Treatment Response: 1.6x2x0.4 N/A N/A Post Debridement Measurements L x W x D (cm) 1.005 N/A N/A Post Debridement Volume: (cm) Excoriation: No N/A N/A Periwound Skin Texture: Induration: No Callus: No Crepitus: No Rash: No Scarring: No Maceration: No N/A N/A Periwound Skin Moisture: Dry/Scaly: No Hemosiderin Staining: Yes N/A N/A Periwound Skin Color: Atrophie Blanche: No Cyanosis: No Ecchymosis: No Erythema: No Mottled: No Pallor: No Rubor: No No Abnormality N/A N/A Temperature: Debridement N/A N/A Procedures Performed: Treatment Notes Electronic Signature(s) Signed: 09/07/2022 11:34:26 AM By: Kalman Shan DO Entered By: Kalman Shan on 09/07/2022 10:10:33 -------------------------------------------------------------------------------- Multi-Disciplinary Care Plan Details Patient Name: Date of Service: [name], CA RO Brandy B. 09/07/2022 8:45 A M Medical Record Number: [id number] Patient Account Number: [id number] Date of Birth/Sex: Treating RN: [birth year] (77 y.o. [name], [name]: [name] Other Clinician: Referring Celestine Prim: Treating Raechel Marcos/Extender: Hollie Beach in  Treatment: 15 Active Inactive Pain, Acute or Chronic Nursing Diagnoses: Pain, acute or chronic: actual or potential Potential alteration in comfort, pain Goals: Patient will verbalize adequate pain control and receive pain control interventions during procedures as needed Date Initiated: 05/25/2022 Target Resolution Date: 09/19/2022 Goal Status: Active Patient/caregiver will verbalize comfort level met Date Initiated: 05/25/2022 Target Resolution Date: 09/19/2022 Goal Status: Active Interventions: Complete pain assessment as per visit requirements Encourage patient to take pain medications as prescribed Provide education on pain management Treatment Activities: Administer pain control measures as ordered : 05/25/2022 Notes: Venous Leg Ulcer SHANTIKA, BERMEA B ([id number]) [id number] Page 4 of 6 Nursing Diagnoses: Knowledge deficit related to disease process and management Goals: Non-invasive venous studies are completed as ordered Date Initiated: 05/25/2022 Target Resolution Date: 09/19/2022 Goal Status: Active Interventions: Assess peripheral edema status every visit. Provide education on venous insufficiency Treatment Activities: Non-invasive vascular studies : 05/25/2022 Notes: Wound/Skin Impairment Nursing Diagnoses: Knowledge deficit related to ulceration/compromised skin integrity Goals: Patient/caregiver will verbalize understanding of skin care regimen Date Initiated: 05/25/2022 Target Resolution Date: 09/19/2022 Goal Status: Active Ulcer/skin breakdown will heal within 14 weeks Date Initiated: 05/25/2022 Target Resolution Date: 10/17/2022 Goal Status: Active Interventions: Assess patient/caregiver ability to obtain necessary supplies Assess patient/caregiver ability to perform ulcer/skin care regimen upon admission and as needed Provide education on ulcer and skin care Treatment Activities: Skin care regimen initiated : 05/25/2022 Topical wound  management initiated : 05/25/2022 Notes: Electronic Signature(s) Signed: 09/07/2022 4:45:06 PM By: Rhae Hammock RN Entered By: Rhae Hammock on 09/07/2022 10:10:22 -------------------------------------------------------------------------------- Pain Assessment Details Patient Name: Date of Service: [name], CA RO Brandy B. 09/07/2022 8:45 A M Medical Record Number: [id number] Patient Account Number: [id number] Date of Birth/Sex: Treating RN: September 09, 1946 (76 y.o. F) Primary Care Lovelee Forner: [name] Other Clinician: Referring Treylin Burtch: Treating Deardra Hinkley/Extender: Hollie Beach in Treatment: 15 Active Problems Location of Pain Severity and Description of Pain Patient Has Paino No Site Locations [place], Gantt B ([id number]) [id number] Page 5 of 6 Pain Management and Medication Current Pain Management: Electronic Signature(s) Signed: 09/07/2022 4:39:11 PM By: [name] Entered By: [name] on 09/07/2022 09:22:17 -------------------------------------------------------------------------------- Wound Assessment Details Patient Name: Date of Service: [name], CA RO Brandy B. 09/07/2022 8:45 A M Medical Record Number: [id number] Patient Account Number: [id number] Date of Birth/Sex: Treating RN: 1946/10/14 (76 y.o. F) Primary Care Smera Guyette:  [name] Other Clinician: Referring Jalyah Weinheimer: Treating Argel Pablo/Extender: Hollie Beach in Treatment: 15 Wound Status Wound Number: 3 Primary Trauma, Other Etiology: Wound Location: Right, Lateral Lower Leg Wound Status: Open Wounding Event: Trauma Comorbid Cataracts, Anemia, Congestive Heart Failure, Hypertension, Date Acquired: 06/20/2022 History: Osteoarthritis Weeks Of Treatment: 10 Clustered Wound: No Photos Wound Measurements Length: (cm) 1.6 Width: (cm) 2 Depth: (cm) 0.4 Area: (cm) 2.513 Volume: (cm) 1.005 % Reduction in Area: 93.6% % Reduction  in Volume: 87.2% Epithelialization: Medium (34-66%) Tunneling: No Undermining: No Wound Description Classification: Full Thickness With Exposed Support Structures QUINA, WILBOURNE B ([id number]) Wound Margin: Distinct, outline attached Exudate Amount: Medium Exudate Type: Serosanguineous Exudate Color: red, brown Foul Odor After Cleansing: No [id number] Page 6 of 6 Slough/Fibrino Yes Wound Bed Granulation Amount: Large (67-100%) Exposed Structure Granulation Quality: Red, Pink Fascia Exposed: No Necrotic Amount: Small (1-33%) Fat Layer (Subcutaneous Tissue) Exposed: Yes Necrotic Quality: Adherent Slough Tendon Exposed: No Muscle Exposed: No Joint Exposed: No Bone Exposed: No Periwound Skin Texture Texture Color No Abnormalities Noted: Yes No Abnormalities Noted: No Atrophie Blanche: No Moisture Cyanosis: No No Abnormalities Noted: Yes Ecchymosis: No Erythema: No Hemosiderin Staining: Yes Mottled: No Pallor: No Rubor: No Temperature / Pain Temperature: No Abnormality Electronic Signature(s) Signed: 09/07/2022 4:39:11 PM By: [name] Entered By: [name] on 09/07/2022 09:27:48 -------------------------------------------------------------------------------- Vitals Details Patient Name: Date of Service: [name], CA RO Brandy B. 09/07/2022 8:45 A M Medical Record Number: [id number] Patient Account Number: [id number] Date of Birth/Sex: Treating RN: Jul 22, 1946 (76 y.o. F) Primary Care Anterio Scheel: [name] Other Clinician: Referring Bryler Dibble: Treating Desare Duddy/Extender: Hollie Beach in Treatment: 15 Vital Signs Time Taken: 09:21 Temperature (F): 98.0 Height (in): 63 Pulse (bpm): 80 Respiratory Rate (breaths/min): 20 Blood Pressure (mmHg): 139/76 Reference Range: 80 - 120 mg / dl Electronic Signature(s) Signed: 09/07/2022 4:39:11 PM By: [name] Entered By: [name] on 09/07/2022 09:22:08

## 2022-09-15 ENCOUNTER — Encounter (HOSPITAL_BASED_OUTPATIENT_CLINIC_OR_DEPARTMENT_OTHER): Payer: Medicare HMO | Admitting: Internal Medicine

## 2022-09-15 DIAGNOSIS — E11622 Type 2 diabetes mellitus with other skin ulcer: Secondary | ICD-10-CM | POA: Diagnosis not present

## 2022-09-15 DIAGNOSIS — I509 Heart failure, unspecified: Secondary | ICD-10-CM | POA: Diagnosis not present

## 2022-09-15 DIAGNOSIS — M1990 Unspecified osteoarthritis, unspecified site: Secondary | ICD-10-CM | POA: Diagnosis not present

## 2022-09-15 DIAGNOSIS — I872 Venous insufficiency (chronic) (peripheral): Secondary | ICD-10-CM | POA: Diagnosis not present

## 2022-09-15 DIAGNOSIS — L97822 Non-pressure chronic ulcer of other part of left lower leg with fat layer exposed: Secondary | ICD-10-CM | POA: Diagnosis not present

## 2022-09-15 DIAGNOSIS — L97812 Non-pressure chronic ulcer of other part of right lower leg with fat layer exposed: Secondary | ICD-10-CM

## 2022-09-15 DIAGNOSIS — I130 Hypertensive heart and chronic kidney disease with heart failure and stage 1 through stage 4 chronic kidney disease, or unspecified chronic kidney disease: Secondary | ICD-10-CM | POA: Diagnosis not present

## 2022-09-15 DIAGNOSIS — N189 Chronic kidney disease, unspecified: Secondary | ICD-10-CM | POA: Diagnosis not present

## 2022-09-15 DIAGNOSIS — E1122 Type 2 diabetes mellitus with diabetic chronic kidney disease: Secondary | ICD-10-CM | POA: Diagnosis not present

## 2022-09-15 NOTE — Progress Notes (Signed)
SABRIA, FLORIDO ([id number]) [id number] Page 1 of 10 Visit Report for 09/15/2022 Chief Complaint Document Details Patient Name: Date of Service: [name], [place] [name]. 09/15/2022 9:00 A M Medical Record Number: [id number] Patient Account Number: [id number] Date of Birth/Sex: Treating RN: 1946-06-03 (76 y.o. F) Primary Care Provider: [name] Other Clinician: Referring Provider: Treating Provider/Extender: Hollie Beach in Treatment: 16 Information Obtained from: Patient Chief Complaint 05/25/2022; left lower extremity wound following a dog scratch 06/23/2022; lacerations to the right and left lower extremity status post fall Electronic Signature(s) Signed: 09/15/2022 11:39:14 AM By: Kalman Shan DO Entered By: Kalman Shan on 09/15/2022 09:46:52 -------------------------------------------------------------------------------- Debridement Details Patient Name: Date of Service: [name], [place] [name]. 09/15/2022 9:00 A M Medical Record Number: [id number] Patient Account Number: [id number] Date of Birth/Sex: Treating RN: 05/03/1946 (75 y.o. [name], [name] Primary Care Provider: [name] Other Clinician: Referring Provider: Treating Provider/Extender: Hollie Beach in Treatment: 16 Debridement Performed for Assessment: Wound #3 Right,Lateral Lower Leg Performed By: Physician Kalman Shan, DO Debridement Type: Debridement Level of Consciousness (Pre-procedure): Awake and Alert Pre-procedure Verification/Time Out Yes - 09:30 Taken: Start Time: 09:31 Pain Control: Lidocaine 5% topical ointment T Area Debrided (L x W): otal 1.3 (cm) x 1.9 (cm) = 2.47 (cm) Tissue and other material debrided: Viable, Non-Viable, Slough, Subcutaneous, Skin: Dermis , Skin: Epidermis, Slough Level: Skin/Subcutaneous Tissue Debridement Description: Excisional Instrument: Curette Bleeding: Minimum Hemostasis Achieved:  Pressure End Time: 09:39 Procedural Pain: 0 Post Procedural Pain: 0 Response to Treatment: Procedure was tolerated well Level of Consciousness (Post- Awake and Alert procedure): Post Debridement Measurements of Total Wound Length: (cm) 1.3 Width: (cm) 1.9 Depth: (cm) 0.3 Volume: (cm) 0.582 Character of Wound/Ulcer Post Debridement: Improved [name], [name] ([id number]) [id number] Page 2 of 10 Post Procedure Diagnosis Same as Pre-procedure Electronic Signature(s) Signed: 09/15/2022 11:39:14 AM By: Kalman Shan DO Signed: 09/15/2022 4:47:08 PM By: [name] RN, BSN Entered By: [name] on 09/15/2022 09:39:20 -------------------------------------------------------------------------------- HPI Details Patient Name: Date of Service: [name], [place] [name]. 09/15/2022 9:00 A M Medical Record Number: [id number] Patient Account Number: [id number] Date of Birth/Sex: Treating RN: 1945-11-23 (76 y.o. F) Primary Care Provider: [name] Other Clinician: Referring Provider: Treating Provider/Extender: Hollie Beach in Treatment: 16 History of Present Illness HPI Description: Admission 05/25/2022 [name] is a 76 year old female with a past medical history of diet-controlled type 2 diabetes, chronic 3 stage kidney disease and venous insufficiency that presents to the clinic for a 1 month history of nonhealing ulcer to the left leg. She states that her dog scratched her causing a wound. She has developed cellulitis in this leg and has been treated with clindamycin by her primary care physician. She reports improvement in her symptoms. She currently denies signs of infection. She has been keeping the area covered. She does not wear compression stockings. She is on 80 mg of Lasix twice daily. She has had reflux studies to the left leg on 07/2021 that notes venous reflux throughout the left common femoral vein and greater  saphenous vein. She has not had an ablation. She follows with vein and vascular for her venous insufficiency. 8/14; patient presents for follow-up. She states that the wrap stayed on for 4 days and eventually slid down. She has been wearing her compression stocking and doing dressing changes with Hydrofera Blue since. She also states she has a hard time putting on her shoes with the 3  layer compression. She denies signs of infection. 8/21; patient presents for follow-up. She again had trouble with the wrap sliding down. We have been using Hydrofera Blue with gentamicin under 2 layer Coflex. She currently denies signs of infection. 8/28; patient presents for follow-up. She reports taking the wrap off yesterday. We have been using Hydrofera Blue with gentamicin under 2 layer Coflex. She states that the wrap does slide down but remains over the wound. She is currently wearing her compression stocking. She would like to do this instead of the compression wrap. 9/5; patient presents for follow-up. She has been using Medihoney to the left lower extremity remedy wound with her compression stockings. She reports no drainage from the site. Unfortunately she fell over the weekend and had to go to the ED due to lacerations she experienced on her left and right lower extremity. On the left leg she had 10 sutures placed on the right leg she had 7. She was given Keflex. Today she reports no signs of infection. She has been using Xeroform to the suture sites. 9/11; patient presents for follow-up. She has been using bacitracin ointment to the suture line. She currently denies signs of infection. 9/19; patient presents for follow-up. She has been using Medihoney to the wound beds. She denies signs of infection. 9/26; patient presents for follow-up. She has been using Dakin's wet-to-dry dressings to the wound beds. She reports improvement in wound healing. She has no issues or complaints today. 10/3; patient  presents for follow-up. We had used compression therapy along with Hydrofera Blue to the right lower extremity. She states she felt uncomfortable with the wrap and took it off 3 days ago. She has been using Medihoney without issues to the left lower extremity wound. She denies signs of infection. 10/10; patient presents for follow-up. She has been using Hydrofera Blue and Medihoney to the left lower extremity wound. We have been using Hydrofera Blue and Santyl under compression therapy to the right lower extremity. She states that the wrap slid down 2 days ago and she took it off. She has no issues or complaints today. She denies signs of infection. 10/17; patient presents for follow-up. We have been using Hydrofera Blue to the left lower extremity wounds and Hydrofera Blue and Santyl under compression therapy to the right lower extremity. She has no issues or complaints today. She denies signs of infection. 10/23; patient presents for follow-up. We have been using silver alginate to the left lower extremity and Hydrofera Blue and Santyl under Kerlix/Coban to the right lower extremity. It is unclear which she is using to the left leg. She denies signs of infection. She has no issues or complaints today. 10/30; patient presents for follow-up. We have been using Hydrofera Blue and Santyl under Kerlix/Coban to the right lower extremity and silver alginate with Medihoney to the left lower extremity under compression stocking. She has no issues or complaints today. 11/13; patient presents for follow-up. We have been using Hydrofera Blue and Santyl under Kerlix/Coban to the right lower extremity. We have been using Medihoney and silver alginate to the left lower extremity wounds. This area is healed. She has no issues or complaints today. 11/20; patient presents for follow-up. We have been using Hydrofera Blue and Santyl under Kerlix/Coban to the right lower extremity. She states that she took the wrap off  yesterday Due to the drainage coming through from the wound bed. 11/28; patient presents for follow-up. She took the wrap off over the weekend. She does  this often. He has no issues or complaints today. We have been using silver alginate with antibiotic ointment under compression therapy. [name], [name] ([id number]) [id number] Page 3 of 10 Electronic Signature(s) Signed: 09/15/2022 11:39:14 AM By: Kalman Shan DO Entered By: Kalman Shan on 09/15/2022 09:47:32 -------------------------------------------------------------------------------- Physical Exam Details Patient Name: Date of Service: [name], [place] [name]. 09/15/2022 9:00 A M Medical Record Number: [id number] Patient Account Number: [id number] Date of Birth/Sex: Treating RN: 13-Feb-1946 (76 y.o. F) Primary Care Provider: [name] Other Clinician: Referring Provider: Treating Provider/Extender: Hollie Beach in Treatment: 16 Constitutional respirations regular, non-labored and within target range for patient.. Cardiovascular 2+ dorsalis pedis/posterior tibialis pulses. Psychiatric pleasant and cooperative. Notes Right lower extremity: Nonviable tissue and granulation tissue in the wound bed. 2+ pitting edema to the knee. Electronic Signature(s) Signed: 09/15/2022 11:39:14 AM By: Kalman Shan DO Entered By: Kalman Shan on 09/15/2022 09:48:19 -------------------------------------------------------------------------------- Physician Orders Details Patient Name: Date of Service: [name], [place] [name]. 09/15/2022 9:00 A M Medical Record Number: [id number] Patient Account Number: [id number] Date of Birth/Sex: Treating RN: May 11, 1946 (76 y.o. [name], [name] Primary Care Provider: [name] Other Clinician: Referring Provider: Treating Provider/Extender: Hollie Beach in Treatment: 16 Verbal / Phone Orders: No Diagnosis Coding ICD-10  Coding Code Description [phone] Non-pressure chronic ulcer of other part of left lower leg with fat layer exposed L97.812 Non-pressure chronic ulcer of other part of right lower leg with fat layer exposed I87.313 Chronic venous hypertension (idiopathic) with ulcer of bilateral lower extremity E11.622 Type 2 diabetes mellitus with other skin ulcer T79.8XXA Other early complications of trauma, initial encounter S81.801A Unspecified open wound, right lower leg, initial encounter S81.802A Unspecified open wound, left lower leg, initial encounter Follow-up Appointments ppointment in 1 week. - w/ Dr. Heber [place] Return A ppointment in 2 weeks. - Dr. Heber [place] Return A [name], [name] ([id number]) [phone].pdf Page 4 of 10 Anesthetic (In clinic) Topical Lidocaine 5% applied to wound bed Bathing/ Shower/ Hygiene May shower with protection but do not get wound dressing(s) wet. Edema Control - Lymphedema / SCD / Other Elevate legs to the level of the heart or above for 30 minutes daily and/or when sitting, a frequency of: - 2-3 times a day throughout the day. Avoid standing for long periods of time. Exercise regularly Moisturize legs daily. - lotion both legs every night before bed. Compression stocking or Garment 20-30 mm/Hg pressure to: - apply in the morning and remove at night. Wound Treatment Wound #3 - Lower Leg Wound Laterality: Right, Lateral Cleanser: Soap and Water 1 x Per Week/30 Days Discharge Instructions: May shower and wash wound with dial antibacterial soap and water prior to dressing change. Peri-Wound Care: Sween Lotion (Moisturizing lotion) 1 x Per Week/30 Days Discharge Instructions: Apply moisturizing lotion as directed Topical: Gentamicin 1 x Per Week/30 Days Discharge Instructions: As directed by physician Topical: Mupirocin Ointment 1 x Per Week/30 Days Discharge Instructions: Apply Mupirocin (Bactroban) as instructed Prim Dressing: KerraCel Ag  Gelling Fiber Dressing, 4x5 in (silver alginate) 1 x Per Week/30 Days ary Discharge Instructions: Apply silver alginate to wound bed as instructed Secondary Dressing: Woven Gauze Sponges 2x2 in 1 x Per Week/30 Days Discharge Instructions: Apply over primary dressing as directed. Secondary Dressing: Zetuvit Plus 4x8 in 1 x Per Week/30 Days Discharge Instructions: Apply over primary dressing as directed. Compression Wrap: Kerlix Roll 4.5x3.1 (in/yd) 1 x Per Week/30 Days Discharge Instructions: Apply Kerlix and Coban compression as directed.  Compression Wrap: Coban Self-Adherent Wrap 4x5 (in/yd) 1 x Per Week/30 Days Discharge Instructions: Apply over Kerlix as directed. Electronic Signature(s) Signed: 09/15/2022 11:39:14 AM By: Kalman Shan DO Entered By: Kalman Shan on 09/15/2022 09:48:26 -------------------------------------------------------------------------------- Problem List Details Patient Name: Date of Service: [name], [place] [name]. 09/15/2022 9:00 A M Medical Record Number: [id number] Patient Account Number: [id number] Date of Birth/Sex: Treating RN: 1946-10-17 (76 y.o. 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/04/2022 No Yes L97.812 Non-pressure chronic ulcer of other part of right lower leg with fat layer 07/14/2022 No Yes exposed LEIYAH, MAULTSBY [name] ([id number]) [id number] Page 5 of 10 I87.313 Chronic venous hypertension (idiopathic) with ulcer of bilateral lower extremity 07/14/2022 No Yes E11.622 Type 2 diabetes mellitus with other skin ulcer 05/25/2022 No Yes T79.8XXA Other early complications of trauma, initial encounter 06/23/2022 No Yes S81.801A  Unspecified open wound, right lower leg, initial encounter 06/23/2022 No Yes S81.802A Unspecified open wound, left lower leg, initial encounter 06/23/2022 No Yes Inactive Problems Resolved Problems ICD-10 Code Description Active Date Resolved Date W54.8XXA Other contact with dog, initial encounter 05/25/2022 05/25/2022 Electronic Signature(s) Signed: 09/15/2022 11:39:14 AM By: Kalman Shan DO Entered By: Kalman Shan on 09/15/2022 09:46:37 -------------------------------------------------------------------------------- Progress Note Details Patient Name: Date of Service: [name], [place] [name]. 09/15/2022 9:00 A M Medical Record Number: [id number] Patient Account Number: [id number] Date of Birth/Sex: Treating RN: [birth year] (76 y.o. F) Primary Care Provider: [name] Other Clinician: Referring Provider: Treating Provider/Extender: Hollie Beach in Treatment: 16 Subjective Chief Complaint Information obtained from Patient 05/25/2022; left lower extremity wound following a dog scratch 06/23/2022; lacerations to the right and left lower extremity status post fall History of Present Illness (HPI) Admission 05/25/2022 [name] is a 76 year old female with a past medical history of diet-controlled type 2 diabetes, chronic 3 stage kidney disease and venous insufficiency that presents to the clinic for a 1 month history of nonhealing ulcer to the left leg. She states that her dog scratched her causing a wound. She has developed cellulitis in this leg and has been treated with clindamycin by her primary care physician. She reports improvement in her symptoms. She currently denies signs of infection. She has been keeping the area covered. She does not wear compression stockings. She is on 80 mg of Lasix twice daily. She has had reflux studies to the left leg on 07/2021 that notes venous reflux throughout the left common femoral vein and greater saphenous vein. She  has not had an ablation. She follows with vein and vascular for her venous insufficiency. 8/14; patient presents for follow-up. She states that the wrap stayed on for 4 days and eventually slid down. She has been wearing her compression stocking and doing dressing changes with Hydrofera Blue since. She also states she has a hard time putting on her shoes with the 3 layer compression. She denies signs of infection. 8/21; patient presents for follow-up. She again had trouble with the wrap sliding down. We have been using Hydrofera Blue with gentamicin under 2 layer Coflex. She currently denies signs of infection. MAKIYA, JEUNE ([id number]) [id number] Page 6 of 10 8/28; patient presents for follow-up. She reports taking the wrap off yesterday. We have been using Hydrofera Blue with gentamicin under  2 layer Coflex. She states that the wrap does slide down but remains over the wound. She is currently wearing her compression stocking. She would like to do this instead of the compression wrap. 9/5; patient presents for follow-up. She has been using Medihoney to the left lower extremity remedy wound with her compression stockings. She reports no drainage from the site. Unfortunately she fell over the weekend and had to go to the ED due to lacerations she experienced on her left and right lower extremity. On the left leg she had 10 sutures placed on the right leg she had 7. She was given Keflex. Today she reports no signs of infection. She has been using Xeroform to the suture sites. 9/11; patient presents for follow-up. She has been using bacitracin ointment to the suture line. She currently denies signs of infection. 9/19; patient presents for follow-up. She has been using Medihoney to the wound beds. She denies signs of infection. 9/26; patient presents for follow-up. She has been using Dakin's wet-to-dry dressings to the wound beds. She reports improvement in wound  healing. She has no issues or complaints today. 10/3; patient presents for follow-up. We had used compression therapy along with Hydrofera Blue to the right lower extremity. She states she felt uncomfortable with the wrap and took it off 3 days ago. She has been using Medihoney without issues to the left lower extremity wound. She denies signs of infection. 10/10; patient presents for follow-up. She has been using Hydrofera Blue and Medihoney to the left lower extremity wound. We have been using Hydrofera Blue and Santyl under compression therapy to the right lower extremity. She states that the wrap slid down 2 days ago and she took it off. She has no issues or complaints today. She denies signs of infection. 10/17; patient presents for follow-up. We have been using Hydrofera Blue to the left lower extremity wounds and Hydrofera Blue and Santyl under compression therapy to the right lower extremity. She has no issues or complaints today. She denies signs of infection. 10/23; patient presents for follow-up. We have been using silver alginate to the left lower extremity and Hydrofera Blue and Santyl under Kerlix/Coban to the right lower extremity. It is unclear which she is using to the left leg. She denies signs of infection. She has no issues or complaints today. 10/30; patient presents for follow-up. We have been using Hydrofera Blue and Santyl under Kerlix/Coban to the right lower extremity and silver alginate with Medihoney to the left lower extremity under compression stocking. She has no issues or complaints today. 11/13; patient presents for follow-up. We have been using Hydrofera Blue and Santyl under Kerlix/Coban to the right lower extremity. We have been using Medihoney and silver alginate to the left lower extremity wounds. This area is healed. She has no issues or complaints today. 11/20; patient presents for follow-up. We have been using Hydrofera Blue and Santyl under Kerlix/Coban to  the right lower extremity. She states that she took the wrap off yesterday Due to the drainage coming through from the wound bed. 11/28; patient presents for follow-up. She took the wrap off over the weekend. She does this often. He has no issues or complaints today. We have been using silver alginate with antibiotic ointment under compression therapy. Patient History Information obtained from Patient. Family History Cancer - Siblings, Diabetes - Siblings, Heart Disease - Mother, Hypertension - Mother, No family history of Hereditary Spherocytosis, Kidney Disease, Lung Disease, Seizures, Stroke, Thyroid Problems, Tuberculosis. Social History Never  smoker, Marital Status - Married, Alcohol Use - Never, Drug Use - No History, Caffeine Use - Moderate. Medical History Eyes Patient has history of Cataracts Denies history of Glaucoma Ear/Nose/Mouth/Throat Denies history of Chronic sinus problems/congestion, Middle ear problems Hematologic/Lymphatic Patient has history of Anemia Denies history of Hemophilia, Human Immunodeficiency Virus, Lymphedema, Sickle Cell Disease Respiratory Denies history of Aspiration, Asthma, Chronic Obstructive Pulmonary Disease (COPD), Pneumothorax, Sleep Apnea, Tuberculosis Cardiovascular Patient has history of Congestive Heart Failure, Hypertension Denies history of Angina, Arrhythmia, Coronary Artery Disease, Deep Vein Thrombosis, Hypotension, Myocardial Infarction, Peripheral Arterial Disease, Peripheral Venous Disease, Phlebitis, Vasculitis Gastrointestinal Denies history of Cirrhosis , Colitis, Crohnoos, Hepatitis A, Hepatitis [name], Hepatitis C Endocrine Denies history of Type I Diabetes, Type II Diabetes Genitourinary Denies history of End Stage Renal Disease Immunological Denies history of Lupus Erythematosus, Raynaudoos, Scleroderma Integumentary (Skin) Denies history of History of Burn Musculoskeletal Patient has history of Osteoarthritis Denies  history of Gout, Rheumatoid Arthritis, Osteomyelitis Neurologic Denies history of Dementia, Neuropathy, Quadriplegia, Paraplegia, Seizure Disorder Oncologic Denies history of Received Chemotherapy Psychiatric Denies history of Anorexia/bulimia, Confinement Anxiety Hospitalization/Surgery History - right total knee replacement. Medical A Surgical History Notes nd Gastrointestinal NAYZETH, ALTMAN ([id number]) [id number] Page 7 of 10 diverticulosis hiatal hernia hyperlipidemia Musculoskeletal DJD Objective Constitutional respirations regular, non-labored and within target range for patient.. Vitals Time Taken: 9:15 AM, Height: 63 in, Temperature: 97.9 F, Pulse: 67 bpm, Respiratory Rate: 20 breaths/min, Blood Pressure: 135/74 mmHg. Cardiovascular 2+ dorsalis pedis/posterior tibialis pulses. Psychiatric pleasant and cooperative. General Notes: Right lower extremity: Nonviable tissue and granulation tissue in the wound bed. 2+ pitting edema to the knee. Integumentary (Hair, Skin) Wound #3 status is Open. Original cause of wound was Trauma. The date acquired was: 06/20/2022. The wound has been in treatment 12 weeks. The wound is located on the Right,Lateral Lower Leg. The wound measures 1.3cm length x 1.9cm width x 0.3cm depth; 1.94cm^2 area and 0.582cm^3 volume. There is Fat Layer (Subcutaneous Tissue) exposed. There is no tunneling or undermining noted. There is a medium amount of serosanguineous drainage noted. The wound margin is distinct with the outline attached to the wound base. There is large (67-100%) red, pink granulation within the wound bed. There is a small (1-33%) amount of necrotic tissue within the wound bed including Adherent Slough. The periwound skin appearance had no abnormalities noted for texture. The periwound skin appearance had no abnormalities noted for moisture. The periwound skin appearance exhibited: Hemosiderin Staining. The  periwound skin appearance did not exhibit: Atrophie Blanche, Cyanosis, Ecchymosis, Mottled, Pallor, Rubor, Erythema. Periwound temperature was noted as No Abnormality. Assessment Active Problems ICD-10 Non-pressure chronic ulcer of other part of left lower leg with fat layer exposed Non-pressure chronic ulcer of other part of right lower leg with fat layer exposed Chronic venous hypertension (idiopathic) with ulcer of bilateral lower extremity Type 2 diabetes mellitus with other skin ulcer Other early complications of trauma, initial encounter Unspecified open wound, right lower leg, initial encounter Unspecified open wound, left lower leg, initial encounter Patient's wound has improved in size and appearance since last clinic visit. I debrided nonviable tissue. I recommended continuing the course with silver alginate and antibiotic ointment under compression therapy. She has significant edema on exam and I discussed the importance of swelling control for her wound healing. I recommended she keep the wrap on as long as she can. She expressed understanding. Follow-up in 1 week. Procedures Wound #3 Pre-procedure diagnosis of Wound #3 is a Trauma, Other located on the  Right,Lateral Lower Leg . There was a Excisional Skin/Subcutaneous Tissue Debridement with a total area of 2.47 sq cm performed by Kalman Shan, DO. With the following instrument(s): Curette to remove Viable and Non-Viable tissue/material. Material removed includes Subcutaneous Tissue, Slough, Skin: Dermis, and Skin: Epidermis after achieving pain control using Lidocaine 5% topical ointment. A time out was conducted at 09:30, prior to the start of the procedure. A Minimum amount of bleeding was controlled with Pressure. The procedure was tolerated well with a pain level of 0 throughout and a pain level of 0 following the procedure. Post Debridement Measurements: 1.3cm length x 1.9cm width x 0.3cm depth; 0.582cm^3  volume. Character of Wound/Ulcer Post Debridement is improved. Post procedure Diagnosis Wound #3: Same as Pre-Procedure Plan Follow-up Appointments: Return Appointment in 1 week. - w/ Dr. Terrace Arabia, [name] ([id number]) [id number] Page 8 of 10 Return Appointment in 2 weeks. - Dr. Heber Grady Anesthetic: (In clinic) Topical Lidocaine 5% applied to wound bed Bathing/ Shower/ Hygiene: May shower with protection but do not get wound dressing(s) wet. Edema Control - Lymphedema / SCD / Other: Elevate legs to the level of the heart or above for 30 minutes daily and/or when sitting, a frequency of: - 2-3 times a day throughout the day. Avoid standing for long periods of time. Exercise regularly Moisturize legs daily. - lotion both legs every night before bed. Compression stocking or Garment 20-30 mm/Hg pressure to: - apply in the morning and remove at night. WOUND #3: - Lower Leg Wound Laterality: Right, Lateral Cleanser: Soap and Water 1 x Per Week/30 Days Discharge Instructions: May shower and wash wound with dial antibacterial soap and water prior to dressing change. Peri-Wound Care: Sween Lotion (Moisturizing lotion) 1 x Per Week/30 Days Discharge Instructions: Apply moisturizing lotion as directed Topical: Gentamicin 1 x Per Week/30 Days Discharge Instructions: As directed by physician Topical: Mupirocin Ointment 1 x Per Week/30 Days Discharge Instructions: Apply Mupirocin (Bactroban) as instructed Prim Dressing: KerraCel Ag Gelling Fiber Dressing, 4x5 in (silver alginate) 1 x Per Week/30 Days ary Discharge Instructions: Apply silver alginate to wound bed as instructed Secondary Dressing: Woven Gauze Sponges 2x2 in 1 x Per Week/30 Days Discharge Instructions: Apply over primary dressing as directed. Secondary Dressing: Zetuvit Plus 4x8 in 1 x Per Week/30 Days Discharge Instructions: Apply over primary dressing as directed. Com pression Wrap: Kerlix Roll  4.5x3.1 (in/yd) 1 x Per Week/30 Days Discharge Instructions: Apply Kerlix and Coban compression as directed. Com pression Wrap: Coban Self-Adherent Wrap 4x5 (in/yd) 1 x Per Week/30 Days Discharge Instructions: Apply over Kerlix as directed. 1. In office sharp debridement 2. Silver alginate with antibiotic ointment under Kerlix/Coban 3. Follow-up in 1 week` Electronic Signature(s) Signed: 09/15/2022 11:39:14 AM By: Kalman Shan DO Entered By: Kalman Shan on 09/15/2022 09:50:22 -------------------------------------------------------------------------------- HxROS Details Patient Name: Date of Service: [name], [place] [name]. 09/15/2022 9:00 A M Medical Record Number: [id number] Patient Account Number: [id number] Date of Birth/Sex: Treating RN: Jan 11, 1946 (76 y.o. F) Primary Care Provider: [name] Other Clinician: Referring Provider: Treating Provider/Extender: Hollie Beach in Treatment: 16 Information Obtained From Patient Eyes Medical History: Positive for: Cataracts Negative for: Glaucoma Ear/Nose/Mouth/Throat Medical History: Negative for: Chronic sinus problems/congestion; Middle ear problems Hematologic/Lymphatic Medical History: Positive for: Anemia Negative for: Hemophilia; Human Immunodeficiency Virus; Lymphedema; Sickle Cell Disease Respiratory Medical HistoryDELLIA, [name] ([id number]) [id number] Page 9 of 10 Negative for: Aspiration; Asthma; Chronic Obstructive Pulmonary Disease (COPD); Pneumothorax; Sleep Apnea; Tuberculosis Cardiovascular Medical History:  Positive for: Congestive Heart Failure; Hypertension Negative for: Angina; Arrhythmia; Coronary Artery Disease; Deep Vein Thrombosis; Hypotension; Myocardial Infarction; Peripheral Arterial Disease; Peripheral Venous Disease; Phlebitis; Vasculitis Gastrointestinal Medical History: Negative for: Cirrhosis ; Colitis; Crohns; Hepatitis A; Hepatitis  [name]; Hepatitis C Past Medical History Notes: diverticulosis hiatal hernia hyperlipidemia Endocrine Medical History: Negative for: Type I Diabetes; Type II Diabetes Genitourinary Medical History: Negative for: End Stage Renal Disease Immunological Medical History: Negative for: Lupus Erythematosus; Raynauds; Scleroderma Integumentary (Skin) Medical History: Negative for: History of Burn Musculoskeletal Medical History: Positive for: Osteoarthritis Negative for: Gout; Rheumatoid Arthritis; Osteomyelitis Past Medical History Notes: DJD Neurologic Medical History: Negative for: Dementia; Neuropathy; Quadriplegia; Paraplegia; Seizure Disorder Oncologic Medical History: Negative for: Received Chemotherapy Psychiatric Medical History: Negative for: Anorexia/bulimia; Confinement Anxiety HBO Extended History Items Eyes: Cataracts Immunizations Pneumococcal Vaccine: Received Pneumococcal Vaccination: Yes Received Pneumococcal Vaccination On or After 60th Birthday: Yes Implantable Devices None Hospitalization / Surgery History Type of Hospitalization/Surgery right total knee replacement [name], [name] ([id number]) [id number] Page 10 of 10 Family and Social History Cancer: Yes - Siblings; Diabetes: Yes - Siblings; Heart Disease: Yes - Mother; Hereditary Spherocytosis: No; Hypertension: Yes - Mother; Kidney Disease: No; Lung Disease: No; Seizures: No; Stroke: No; Thyroid Problems: No; Tuberculosis: No; Never smoker; Marital Status - Married; Alcohol Use: Never; Drug Use: No History; Caffeine Use: Moderate; Financial Concerns: No; Food, Clothing or Shelter Needs: No; Support System Lacking: No; Transportation Concerns: No Electronic Signature(s) Signed: 09/15/2022 11:39:14 AM By: Kalman Shan DO Entered By: Kalman Shan on 09/15/2022 09:47:37 -------------------------------------------------------------------------------- SuperBill  Details Patient Name: Date of Service: [name], [place] [name]. 09/15/2022 Medical Record Number: [id number] Patient Account Number: [id number] Date of Birth/Sex: Treating RN: [birth year] (76 y.o. [name], [name] Primary Care Provider: [name] Other Clinician: Referring Provider: Treating Provider/Extender: Hollie Beach in Treatment: 16 Diagnosis Coding ICD-10 Codes Code Description [phone] Non-pressure chronic ulcer of other part of left lower leg with fat layer exposed L97.812 Non-pressure chronic ulcer of other part of right lower leg with fat layer exposed I87.313 Chronic venous hypertension (idiopathic) with ulcer of bilateral lower extremity E11.622 Type 2 diabetes mellitus with other skin ulcer T79.8XXA Other early complications of trauma, initial encounter S81.801A Unspecified open wound, right lower leg, initial encounter S81.802A Unspecified open wound, left lower leg, initial encounter Facility Procedures : CPT4 Code: [id number] Description: [place] TISSUE 20 SQ CM/< ICD-10 Diagnosis Description L97.812 Non-pressure chronic ulcer of other part of right lower leg with fat layer exp Modifier: osed Quantity: 1 Physician Procedures : CPT4 Code Description Modifier [id number] - WC PHYS SUBQ TISS 20 SQ CM ICD-10 Diagnosis Description F81.829 Non-pressure chronic ulcer of other part of right lower leg with fat layer exposed Quantity: 1 Electronic Signature(s) Signed: 09/15/2022 11:39:14 AM By: Kalman Shan DO Entered By: Kalman Shan on 09/15/2022 09:50:30

## 2022-09-15 NOTE — Progress Notes (Signed)
GLORIANN, RIEDE ([id number]) [id number] Page 1 of 8 Visit Report for 09/15/2022 Arrival Information Details Patient Name: Date of Service: [name], [name] 09/15/2022 9:00 A M Medical Record Number: [id number] Patient Account Number: [id number] Date of Birth/Sex: Treating RN: [birth year] (76 y.o. F) Primary Care Shaleen Talamantez: [name] Other Clinician: Referring Dwanna Goshert: Treating Tonjua Rossetti/Extender: Hollie Beach in Treatment: 20 Visit Information History Since Last Visit All ordered tests and consults were completed: No Patient Arrived: Ambulatory Added or deleted any medications: No Arrival Time: 09:16 Any new allergies or adverse reactions: No Accompanied By: son Had a fall or experienced change in No Transfer Assistance: None activities of daily living that may affect Patient Identification Verified: Yes risk of falls: Secondary Verification Process Completed: Yes Signs or symptoms of abuse/neglect since last visito No Patient Requires Transmission-Based Precautions: No Hospitalized since last visit: No Patient Has Alerts: No Implantable device outside of the clinic excluding No cellular tissue based products placed in the center since last visit: Pain Present Now: No Electronic Signature(s) Signed: 09/15/2022 9:55:20 AM By: Worthy Rancher Entered By: Worthy Rancher on 09/15/2022 09:16:52 -------------------------------------------------------------------------------- Encounter Discharge Information Details Patient Name: Date of Service: [name], [place] [name] 09/15/2022 9:00 A M Medical Record Number: [id number] Patient Account Number: [id number] Date of Birth/Sex: Treating RN: 11/24/1945 (76 y.o. [name], [name] Primary Care Kathie Posa: [name] Other Clinician: Referring Tinisha Etzkorn: Treating Masae Lukacs/Extender: Hollie Beach in Treatment: 16 Encounter Discharge Information Items Post Procedure  Vitals Discharge Condition: Stable Temperature (F): 97.9 Ambulatory Status: Walker Pulse (bpm): 67 Discharge Destination: Home Respiratory Rate (breaths/min): 20 Transportation: Private Auto Blood Pressure (mmHg): 135/74 Accompanied By: son Schedule Follow-up Appointment: Yes Clinical Summary of Care: Electronic Signature(s) Signed: 09/15/2022 4:47:08 PM By: [name] RN, BSN Entered By: [name] on 09/15/2022 09:40:49 [name] ([id number]) [id number] Page 2 of 8 -------------------------------------------------------------------------------- Lower Extremity Assessment Details Patient Name: Date of Service: [name] [name] 09/15/2022 9:00 A M Medical Record Number: [id number] Patient Account Number: [id number] Date of Birth/Sex: Treating RN: 1946-10-06 (76 y.o. [name] Primary Care Ayoub Arey: [name] Other Clinician: Referring Gwendolynn Merkey: Treating Krystiana Fornes/Extender: Hollie Beach in Treatment: 16 Edema Assessment Assessed: [name]: No] Patrice Paradise: Yes] Edema: [Left: Ye] [Right: s] Calf Left: Right: Point of Measurement: 41 cm From Medial Instep 39 cm Ankle Left: Right: Point of Measurement: 8 cm From Medial Instep 24 cm Vascular Assessment Pulses: Dorsalis Pedis Palpable: [Right:Yes] Electronic Signature(s) Signed: 09/15/2022 4:47:08 PM By: [name] RN, BSN Entered By: [name] on 09/15/2022 09:22:32 -------------------------------------------------------------------------------- Multi Wound Chart Details Patient Name: Date of Service: [name], [place] [name] 09/15/2022 9:00 A M Medical Record Number: [id number] Patient Account Number: [id number] Date of Birth/Sex: Treating RN: 24-Jan-1946 (76 y.o. F) Primary Care Lakethia Coppess: [name] Other Clinician: Referring Natosha Bou: Treating Shameika Speelman/Extender: Hollie Beach in Treatment: 16 Vital Signs Height(in):  28 Pulse(bpm): 53 Weight(lbs): Blood Pressure(mmHg): 135/74 Body Mass Index(BMI): Temperature(F): 97.9 Respiratory Rate(breaths/min): 20 [3:Photos:] [N/A:N/A] Right, Lateral Lower Leg N/A N/A Wound Location: Trauma N/A N/A Wounding Event: Trauma, Other N/A N/A Primary Etiology: Cataracts, Anemia, Congestive Heart N/A N/A Comorbid History: Failure, Hypertension, Osteoarthritis 06/20/2022 N/A N/A Date Acquired: 12 N/A N/A Weeks of Treatment: Open N/A N/A Wound Status: No N/A N/A Wound Recurrence: 1.3x1.9x0.3 N/A N/A Measurements L x W x D (cm) 1.94 N/A N/A A (cm) : rea 0.582 N/A N/A Volume (cm) : 95.10% N/A N/A %  Reduction in A rea: 92.60% N/A N/A % Reduction in Volume: Full Thickness With Exposed Support N/A N/A Classification: Structures Medium N/A N/A Exudate A mount: Serosanguineous N/A N/A Exudate Type: red, brown N/A N/A Exudate Color: Distinct, outline attached N/A N/A Wound Margin: Large (67-100%) N/A N/A Granulation A mount: Red, Pink N/A N/A Granulation Quality: Small (1-33%) N/A N/A Necrotic A mount: Fat Layer (Subcutaneous Tissue): Yes N/A N/A Exposed Structures: Fascia: No Tendon: No Muscle: No Joint: No Bone: No Medium (34-66%) N/A N/A Epithelialization: Debridement - Excisional N/A N/A Debridement: Pre-procedure Verification/Time Out 09:30 N/A N/A Taken: Lidocaine 5% topical ointment N/A N/A Pain Control: Subcutaneous, Slough N/A N/A Tissue Debrided: Skin/Subcutaneous Tissue N/A N/A Level: 2.47 N/A N/A Debridement A (sq cm): rea Curette N/A N/A Instrument: Minimum N/A N/A Bleeding: Pressure N/A N/A Hemostasis A chieved: 0 N/A N/A Procedural Pain: 0 N/A N/A Post Procedural Pain: Procedure was tolerated well N/A N/A Debridement Treatment Response: 1.3x1.9x0.3 N/A N/A Post Debridement Measurements L x W x D (cm) 0.582 N/A N/A Post Debridement Volume: (cm) Excoriation: No N/A N/A Periwound Skin  Texture: Induration: No Callus: No Crepitus: No Rash: No Scarring: No Maceration: No N/A N/A Periwound Skin Moisture: Dry/Scaly: No Hemosiderin Staining: Yes N/A N/A Periwound Skin Color: Atrophie Blanche: No Cyanosis: No Ecchymosis: No Erythema: No Mottled: No Pallor: No Rubor: No No Abnormality N/A N/A Temperature: Debridement N/A N/A Procedures Performed: Treatment Notes Wound #3 (Lower Leg) Wound Laterality: Right, Lateral Cleanser Soap and Water Discharge Instruction: May shower and wash wound with dial antibacterial soap and water prior to dressing change. Peri-Wound Care Sween Lotion (Moisturizing lotion) Discharge Instruction: Apply moisturizing lotion as directed Topical Gentamicin Discharge Instruction: As directed by physician Mupirocin Ointment Discharge Instruction: Apply Mupirocin (Bactroban) as instructed [name], [name] ([id number]) [id number] Page 4 of 8 Primary Dressing KerraCel Ag Gelling Fiber Dressing, 4x5 in (silver alginate) Discharge Instruction: Apply silver alginate to wound bed as instructed Secondary Dressing Woven Gauze Sponges 2x2 in Discharge Instruction: Apply over primary dressing as directed. Zetuvit Plus 4x8 in Discharge Instruction: Apply over primary dressing as directed. Secured With Compression Wrap Kerlix Roll 4.5x3.1 (in/yd) Discharge Instruction: Apply Kerlix and Coban compression as directed. Coban Self-Adherent Wrap 4x5 (in/yd) Discharge Instruction: Apply over Kerlix as directed. Compression Stockings Add-Ons Electronic Signature(s) Signed: 09/15/2022 11:39:14 AM By: Kalman Shan DO Entered By: Kalman Shan on 09/15/2022 09:46:42 -------------------------------------------------------------------------------- Multi-Disciplinary Care Plan Details Patient Name: Date of Service: [name], [place] [name] 09/15/2022 9:00 A M Medical Record Number: [id number] Patient Account Number:  [id number] Date of Birth/Sex: Treating RN: 1945-10-30 (76 y.o. [name], [name] Primary Care Provider: [name] Other Clinician: Referring Provider: Treating Provider/Extender: Hollie Beach in Treatment: 16 Active Inactive Pain, Acute or Chronic Nursing Diagnoses: Pain, acute or chronic: actual or potential Potential alteration in comfort, pain Goals: Patient will verbalize adequate pain control and receive pain control interventions during procedures as needed Date Initiated: 05/25/2022 Target Resolution Date: 09/19/2022 Goal Status: Active Patient/caregiver will verbalize comfort level met Date Initiated: 05/25/2022 Target Resolution Date: 09/19/2022 Goal Status: Active Interventions: Complete pain assessment as per visit requirements Encourage patient to take pain medications as prescribed Provide education on pain management Treatment Activities: Administer pain control measures as ordered : 05/25/2022 Notes: Venous Leg Ulcer Nursing Diagnoses: AAMIYAH, DERRICK ([id number]) [id number] Page 5 of 8 Knowledge deficit related to disease process and management Goals: Non-invasive venous studies are completed as ordered Date Initiated: 05/25/2022 Target Resolution Date: 09/19/2022 Goal Status:  Active Interventions: Assess peripheral edema status every visit. Provide education on venous insufficiency Treatment Activities: Non-invasive vascular studies : 05/25/2022 Notes: Wound/Skin Impairment Nursing Diagnoses: Knowledge deficit related to ulceration/compromised skin integrity Goals: Patient/caregiver will verbalize understanding of skin care regimen Date Initiated: 05/25/2022 Target Resolution Date: 09/19/2022 Goal Status: Active Ulcer/skin breakdown will heal within 14 weeks Date Initiated: 05/25/2022 Target Resolution Date: 10/17/2022 Goal Status: Active Interventions: Assess patient/caregiver ability to obtain necessary  supplies Assess patient/caregiver ability to perform ulcer/skin care regimen upon admission and as needed Provide education on ulcer and skin care Treatment Activities: Skin care regimen initiated : 05/25/2022 Topical wound management initiated : 05/25/2022 Notes: Electronic Signature(s) Signed: 09/15/2022 4:47:08 PM By: [name] RN, BSN Entered By: [name] on 09/15/2022 09:23:12 -------------------------------------------------------------------------------- Pain Assessment Details Patient Name: Date of Service: [name], [place] [name] 09/15/2022 9:00 A M Medical Record Number: [id number] Patient Account Number: [id number] Date of Birth/Sex: Treating RN: 09/26/1946 (76 y.o. F) Primary Care Provider: [name] Other Clinician: Referring Provider: Treating Provider/Extender: Hollie Beach in Treatment: 16 Active Problems Location of Pain Severity and Description of Pain Patient Has Paino No Site Locations [name], [name] ([id number]) [id number] Page 6 of 8 Pain Management and Medication Current Pain Management: Electronic Signature(s) Signed: 09/15/2022 9:55:20 AM By: Worthy Rancher Entered By: Worthy Rancher on 09/15/2022 09:17:23 -------------------------------------------------------------------------------- Patient/Caregiver Education Details Patient Name: Date of Service: [name], [place] [name] 11/28/2023andnbsp9:00 A M Medical Record Number: [id number] Patient Account Number: [id number] Date of Birth/Gender: Treating RN: 1945/12/14 (76 y.o. [name] Primary Care Physician: [name] Other Clinician: Referring Physician: Treating Physician/Extender: Hollie Beach in Treatment: 16 Education Assessment Education Provided To: Patient Education Topics Provided Wound/Skin Impairment: Handouts: Skin Care Do's and Dont's Methods: Explain/Verbal Responses: Reinforcements needed Electronic  Signature(s) Signed: 09/15/2022 4:47:08 PM By: [name] RN, BSN Entered By: [name] on 09/15/2022 09:23:22 -------------------------------------------------------------------------------- Wound Assessment Details Patient Name: Date of Service: [name], [place] [name] 09/15/2022 9:00 A M Medical Record Number: [id number] Patient Account Number: [id number] Date of Birth/Sex: Treating RN: 1946-02-06 (76 y.o. F) Primary Care Provider: [name] Other Clinician: Thomasene [name] ([id number]) [id number] Page 7 of 8 Referring Provider: Treating Provider/Extender: Hollie Beach in Treatment: 16 Wound Status Wound Number: 3 Primary Trauma, Other Etiology: Wound Location: Right, Lateral Lower Leg Wound Status: Open Wounding Event: Trauma Comorbid Cataracts, Anemia, Congestive Heart Failure, Hypertension, Date Acquired: 06/20/2022 History: Osteoarthritis Weeks Of Treatment: 12 Clustered Wound: No Photos Wound Measurements Length: (cm) 1.3 Width: (cm) 1.9 Depth: (cm) 0.3 Area: (cm) 1.94 Volume: (cm) 0.582 % Reduction in Area: 95.1% % Reduction in Volume: 92.6% Epithelialization: Medium (34-66%) Tunneling: No Undermining: No Wound Description Classification: Full Thickness With Exposed Suppo Wound Margin: Distinct, outline attached Exudate Amount: Medium Exudate Type: Serosanguineous Exudate Color: red, brown rt Structures Foul Odor After Cleansing: No Slough/Fibrino Yes Wound Bed Granulation Amount: Large (67-100%) Exposed Structure Granulation Quality: Red, Pink Fascia Exposed: No Necrotic Amount: Small (1-33%) Fat Layer (Subcutaneous Tissue) Exposed: Yes Necrotic Quality: Adherent Slough Tendon Exposed: No Muscle Exposed: No Joint Exposed: No Bone Exposed: No Periwound Skin Texture Texture Color No Abnormalities Noted: Yes No Abnormalities Noted: No Atrophie Blanche: No Moisture Cyanosis: No No  Abnormalities Noted: Yes Ecchymosis: No Erythema: No Hemosiderin Staining: Yes Mottled: No Pallor: No Rubor: No Temperature / Pain Temperature: No Abnormality Treatment Notes Wound #3 (Lower Leg) Wound Laterality: Right, Lateral Cleanser Soap and Water Discharge  Instruction: May shower and wash wound with dial antibacterial soap and water prior to dressing change. Peri-Wound Care Sween Lotion (Moisturizing lotion) Discharge Instruction: Apply moisturizing lotion as directed [name], [name] ([id number]) [id number] Page 8 of 8 Topical Gentamicin Discharge Instruction: As directed by physician Mupirocin Ointment Discharge Instruction: Apply Mupirocin (Bactroban) as instructed Primary Dressing KerraCel Ag Gelling Fiber Dressing, 4x5 in (silver alginate) Discharge Instruction: Apply silver alginate to wound bed as instructed Secondary Dressing Woven Gauze Sponges 2x2 in Discharge Instruction: Apply over primary dressing as directed. Zetuvit Plus 4x8 in Discharge Instruction: Apply over primary dressing as directed. Secured With Compression Wrap Kerlix Roll 4.5x3.1 (in/yd) Discharge Instruction: Apply Kerlix and Coban compression as directed. Coban Self-Adherent Wrap 4x5 (in/yd) Discharge Instruction: Apply over Kerlix as directed. Compression Stockings Add-Ons Electronic Signature(s) Signed: 09/15/2022 4:47:08 PM By: [name] RN, BSN Entered By: [name] on 09/15/2022 09:22:41 -------------------------------------------------------------------------------- Vitals Details Patient Name: Date of Service: [name], [place] [name] 09/15/2022 9:00 A M Medical Record Number: [id number] Patient Account Number: [id number] Date of Birth/Sex: Treating RN: 01/05/1946 (76 y.o. F) Primary Care Provider: [name] Other Clinician: Referring Provider: Treating Provider/Extender: Hollie Beach in Treatment: 16 Vital Signs Time  Taken: 09:15 Temperature (F): 97.9 Height (in): 63 Pulse (bpm): 67 Respiratory Rate (breaths/min): 20 Blood Pressure (mmHg): 135/74 Reference Range: 80 - 120 mg / dl Electronic Signature(s) Signed: 09/15/2022 9:55:20 AM By: Worthy Rancher Entered By: Worthy Rancher on 09/15/2022 09:17:14

## 2022-09-16 DIAGNOSIS — Z6838 Body mass index (BMI) 38.0-38.9, adult: Secondary | ICD-10-CM | POA: Diagnosis not present

## 2022-09-16 DIAGNOSIS — M48062 Spinal stenosis, lumbar region with neurogenic claudication: Secondary | ICD-10-CM | POA: Diagnosis not present

## 2022-09-16 DIAGNOSIS — F1120 Opioid dependence, uncomplicated: Secondary | ICD-10-CM | POA: Diagnosis not present

## 2022-09-17 ENCOUNTER — Other Ambulatory Visit: Payer: Self-pay | Admitting: Internal Medicine

## 2022-09-17 NOTE — Telephone Encounter (Signed)
Please refill as per office routine med refill policy (all routine meds to be refilled for 3 mo or monthly (per pt preference) up to one year from last visit, then month to month grace period for 3 mo, then further med refills will have to be denied) ? ?

## 2022-09-21 ENCOUNTER — Encounter (HOSPITAL_BASED_OUTPATIENT_CLINIC_OR_DEPARTMENT_OTHER): Payer: Medicare HMO | Attending: Internal Medicine | Admitting: Internal Medicine

## 2022-09-21 DIAGNOSIS — E1122 Type 2 diabetes mellitus with diabetic chronic kidney disease: Secondary | ICD-10-CM | POA: Diagnosis not present

## 2022-09-21 DIAGNOSIS — I87313 Chronic venous hypertension (idiopathic) with ulcer of bilateral lower extremity: Secondary | ICD-10-CM | POA: Diagnosis not present

## 2022-09-21 DIAGNOSIS — X58XXXA Exposure to other specified factors, initial encounter: Secondary | ICD-10-CM | POA: Diagnosis not present

## 2022-09-21 DIAGNOSIS — I87311 Chronic venous hypertension (idiopathic) with ulcer of right lower extremity: Secondary | ICD-10-CM | POA: Diagnosis not present

## 2022-09-21 DIAGNOSIS — T798XXA Other early complications of trauma, initial encounter: Secondary | ICD-10-CM | POA: Insufficient documentation

## 2022-09-21 DIAGNOSIS — E11622 Type 2 diabetes mellitus with other skin ulcer: Secondary | ICD-10-CM | POA: Diagnosis not present

## 2022-09-21 DIAGNOSIS — L97812 Non-pressure chronic ulcer of other part of right lower leg with fat layer exposed: Secondary | ICD-10-CM | POA: Diagnosis not present

## 2022-09-21 DIAGNOSIS — N183 Chronic kidney disease, stage 3 unspecified: Secondary | ICD-10-CM | POA: Diagnosis not present

## 2022-09-21 NOTE — Progress Notes (Addendum)
NAKINA, SPATZ ([id number]) [id number] Page 1 of 8 Visit Report for 09/21/2022 Arrival Information Details Patient Name: Date of Service: [name] [name]. 09/21/2022 9:15 A M Medical Record Number: [id number] Patient Account Number: [id number] Date of Birth/Sex: Treating RN: 08-Nov-1945 (76 y.o. [name], [name] Primary Care Rosaly Labarbera: [name] Other Clinician: Referring Ameera Tigue: Treating Adiel Mcnamara/Extender: Hollie Beach in Treatment: 90 Visit Information History Since Last Visit Added or deleted any medications: No Patient Arrived: [name] Any new allergies or adverse reactions: No Arrival Time: 09:10 Had a fall or experienced change in No Accompanied By: son activities of daily living that may affect Transfer Assistance: None risk of falls: Patient Identification Verified: Yes Signs or symptoms of abuse/neglect since last visito No Secondary Verification Process Completed: Yes Hospitalized since last visit: No Patient Requires Transmission-Based Precautions: No Implantable device outside of the clinic excluding No Patient Has Alerts: No cellular tissue based products placed in the center since last visit: Has Dressing in Place as Prescribed: Yes Has Compression in Place as Prescribed: No Pain Present Now: No Notes removed wrap over weekend and replaced with compression stockings. Electronic Signature(s) Signed: 09/21/2022 4:59:17 PM By: [name] RN, BSN Entered By: [name] on 09/21/2022 09:18:20 -------------------------------------------------------------------------------- Clinic Level of Care Assessment Details Patient Name: Date of Service: [name]. 09/21/2022 9:15 A M Medical Record Number: [id number] Patient Account Number: [id number] Date of Birth/Sex: Treating RN: 05-30-1946 (76 y.o. [name], [name] Primary Care Aylyn Wenzler: [name] Other Clinician: Referring Adryel Wortmann: Treating  Jhordan Mckibben/Extender: Hollie Beach in Treatment: 17 Clinic Level of Care Assessment Items TOOL 4 Quantity Score X- 1 0 Use when only an EandM is performed on FOLLOW-UP visit ASSESSMENTS - Nursing Assessment / Reassessment X- 1 10 Reassessment of Co-morbidities (includes updates in patient status) X- 1 5 Reassessment of Adherence to Treatment Plan ASSESSMENTS - Wound and Skin A ssessment / Reassessment X - Simple Wound Assessment / Reassessment - one wound 1 5 _0  - 0 Complex Wound Assessment / Reassessment - multiple wounds _1  - 0 Dermatologic / Skin Assessment (not related to wound area) ASSESSMENTS - Focused Assessment [name] ([id number]) [id number] Page 2 of 8 X- 1 5 Circumferential Edema Measurements - multi extremities _2  - 0 Nutritional Assessment / Counseling / Intervention _3  - 0 Lower Extremity Assessment (monofilament, tuning fork, pulses) _4  - 0 Peripheral Arterial Disease Assessment (using hand held doppler) ASSESSMENTS - Ostomy and/or Continence Assessment and Care _5  - 0 Incontinence Assessment and Management _6  - 0 Ostomy Care Assessment and Management (repouching, etc.) PROCESS - Coordination of Care X - Simple Patient / Family Education for ongoing care 1 15 _7  - 0 Complex (extensive) Patient / Family Education for ongoing care X- 1 10 Staff obtains Programmer, systems, Records, T Results / Process Orders est _8  - 0 Staff telephones HHA, Nursing Homes / Clarify orders / etc _9  - 0 Routine Transfer to another Facility (non-emergent condition) _10  - 0 Routine Hospital Admission (non-emergent condition) _11  - 0 New Admissions / Biomedical engineer / Ordering NPWT Apligraf, etc. , _12  - 0 Emergency Hospital Admission (emergent condition) X- 1 10 Simple Discharge Coordination _13  - 0 Complex (extensive) Discharge Coordination PROCESS - Special Needs _14  - 0 Pediatric / Minor Patient Management _15  -  0 Isolation Patient Management _16  - 0 Hearing / Language / Visual special needs _17  - 0 Assessment of Community assistance (transportation, D/C planning, etc.) _18  - 0 Additional assistance /  Altered mentation _0  - 0 Support Surface(s) Assessment (bed, cushion, seat, etc.) INTERVENTIONS - Wound Cleansing / Measurement X - Simple Wound Cleansing - one wound 1 5 _1  - 0 Complex Wound Cleansing - multiple wounds X- 1 5 Wound Imaging (photographs - any number of wounds) _2  - 0 Wound Tracing (instead of photographs) X- 1 5 Simple Wound Measurement - one wound _3  - 0 Complex Wound Measurement - multiple wounds INTERVENTIONS - Wound Dressings _4  - 0 Small Wound Dressing one or multiple wounds X- 1 15 Medium Wound Dressing one or multiple wounds _5  - 0 Large Wound Dressing one or multiple wounds <ZOXWRUEAVWUJWJXB>_1<\/YNWGNFAOZHYQMVHQ>_4  - 0 Application of Medications - topical <ONGEXBMWUXLKGMWN>_0<\/UVOZDGUYQIHKVQQV>_9  - 0 Application of Medications - injection INTERVENTIONS - Miscellaneous _8  - 0 External ear exam _9  - 0 Specimen Collection (cultures, biopsies, blood, body fluids, etc.) _10  - 0 Specimen(s) / Culture(s) sent or taken to Lab for analysis _11  - 0 Patient Transfer (multiple staff / Harrel Lemon Lift / Similar devices) _12  - 0 Simple Staple / Suture removal (25 or less) AMERY, VANDENBOS [name] ([id number]) [id number] Page 3 of 8 _13  - 0 Complex Staple / Suture removal (26 or more) _14  - 0 Hypo / Hyperglycemic Management (close monitor of Blood Glucose) _15  - 0 Ankle / Brachial Index (ABI) - do not check if billed separately X- 1 5 Vital Signs Has the patient been seen at the hospital within the last three years: Yes Total Score: 95 Level Of Care: New/Established - Level 3 Electronic Signature(s) Signed: 10/21/2022 5:35:40 PM By: Rhae Hammock RN Entered By: Rhae Hammock on 10/02/2022 11:24:06 -------------------------------------------------------------------------------- Lower Extremity Assessment Details Patient  Name: Date of Service: [name]. 09/21/2022 9:15 A M Medical Record Number: [id number] Patient Account Number: [id number] Date of Birth/Sex: Treating RN: 11/15/1945 (76 y.o. [name] Primary Care Meilani Edmundson: [name] Other Clinician: Referring Eartha Vonbehren: Treating Lonald Troiani/Extender: Hollie Beach in Treatment: 17 Edema Assessment Assessed: [name]: No] Patrice Paradise: Yes] Edema: [Left: Ye] [Right: s] Calf Left: Right: Point of Measurement: 41 cm From Medial Instep 44 cm Ankle Left: Right: Point of Measurement: 8 cm From Medial Instep 24 cm Vascular Assessment Pulses: Dorsalis Pedis Palpable: [Right:Yes] Electronic Signature(s) Signed: 09/21/2022 4:59:17 PM By: [name] RN, BSN Entered By: [name] on 09/21/2022 09:17:32 -------------------------------------------------------------------------------- Multi Wound Chart Details Patient Name: Date of Service: [name], Brandy RO [name]. 09/21/2022 9:15 A M Medical Record Number: [id number] Patient Account Number: [id number] Date of Birth/Sex: Treating RN: August 22, 1946 (76 y.o. F) Primary Care Kunio Cummiskey: [name] Other Clinician: Referring Sharniece Gibbon: Treating Hadyn Azer/Extender: Hollie Beach in Treatment: [address] AMBREA, HEGLER [name] ([id number]) [id number] Page 4 of 8 Height(in): 63 Pulse(bpm): 67 Weight(lbs): Blood Pressure(mmHg): 155/82 Body Mass Index(BMI): Temperature(F): 97.8 Respiratory Rate(breaths/min): 20 [3:Photos:] [N/A:N/A] Right, Lateral Lower Leg N/A N/A Wound Location: Trauma N/A N/A Wounding Event: Trauma, Other N/A N/A Primary Etiology: Cataracts, Anemia, Congestive Heart N/A N/A Comorbid History: Failure, Hypertension, Osteoarthritis 06/20/2022 N/A N/A Date Acquired: 12 N/A N/A Weeks of Treatment: Open N/A N/A Wound Status: No N/A N/A Wound Recurrence: 1.3x1.5x0.2 N/A N/A Measurements L x W x D (cm) 1.532 N/A  N/A A (cm) : rea 0.306 N/A N/A Volume (cm) : 96.10% N/A N/A % Reduction in Area: 96.10% N/A N/A % Reduction in Volume: Full Thickness With Exposed Support N/A N/A Classification: Structures Medium N/A N/A Exudate Amount: Serosanguineous N/A N/A Exudate Type: red, brown N/A N/A Exudate Color: Distinct, outline attached N/A N/A  Wound Margin: Large (67-100%) N/A N/A Granulation Amount: Red, Pink N/A N/A Granulation Quality: Small (1-33%) N/A N/A Necrotic Amount: Fat Layer (Subcutaneous Tissue): Yes N/A N/A Exposed Structures: Fascia: No Tendon: No Muscle: No Joint: No Bone: No Medium (34-66%) N/A N/A Epithelialization: Excoriation: No N/A N/A Periwound Skin Texture: Induration: No Callus: No Crepitus: No Rash: No Scarring: No Maceration: No N/A N/A Periwound Skin Moisture: Dry/Scaly: No Hemosiderin Staining: Yes N/A N/A Periwound Skin Color: Atrophie Blanche: No Cyanosis: No Ecchymosis: No Erythema: No Mottled: No Pallor: No Rubor: No No Abnormality N/A N/A Temperature: Treatment Notes Electronic Signature(s) Signed: 09/21/2022 11:10:27 AM By: Kalman Shan DO Entered By: Kalman Shan on 09/21/2022 10:03:52 Thomasene Mohair ([id number]) [id number] Page 5 of 8 -------------------------------------------------------------------------------- Multi-Disciplinary Care Plan Details Patient Name: Date of Service: [name] [name]. 09/21/2022 9:15 A M Medical Record Number: [id number] Patient Account Number: [id number] Date of Birth/Sex: Treating RN: January 04, 1946 (76 y.o. [name], [name] Primary Care Latitia Housewright: [name] Other Clinician: Referring Daleah Coulson: Treating Lorelie Biermann/Extender: Hollie Beach in Treatment: 17 Active Inactive Pain, Acute or Chronic Nursing Diagnoses: Pain, acute or chronic: actual or potential Potential alteration in comfort, pain Goals: Patient will verbalize adequate  pain control and receive pain control interventions during procedures as needed Date Initiated: 05/25/2022 Target Resolution Date: 09/19/2022 Goal Status: Active Patient/caregiver will verbalize comfort level met Date Initiated: 05/25/2022 Target Resolution Date: 09/19/2022 Goal Status: Active Interventions: Complete pain assessment as per visit requirements Encourage patient to take pain medications as prescribed Provide education on pain management Treatment Activities: Administer pain control measures as ordered : 05/25/2022 Notes: Venous Leg Ulcer Nursing Diagnoses: Knowledge deficit related to disease process and management Goals: Non-invasive venous studies are completed as ordered Date Initiated: 05/25/2022 Target Resolution Date: 09/19/2022 Goal Status: Active Interventions: Assess peripheral edema status every visit. Provide education on venous insufficiency Treatment Activities: Non-invasive vascular studies : 05/25/2022 Notes: Wound/Skin Impairment Nursing Diagnoses: Knowledge deficit related to ulceration/compromised skin integrity Goals: Patient/caregiver will verbalize understanding of skin care regimen Date Initiated: 05/25/2022 Target Resolution Date: 09/19/2022 Goal Status: Active Ulcer/skin breakdown will heal within 14 weeks Date Initiated: 05/25/2022 Target Resolution Date: 10/17/2022 Goal Status: Active Interventions: Assess patient/caregiver ability to obtain necessary supplies Assess patient/caregiver ability to perform ulcer/skin care regimen upon admission and as needed Provide education on ulcer and skin care Treatment Activities: Skin care regimen initiated : 05/25/2022 Topical wound management initiated : 05/25/2022 Thomasene Mohair ([id number]) [phone].pdf Page 6 of 8 Notes: Electronic Signature(s) Signed: 09/21/2022 3:46:46 PM By: Rhae Hammock RN Entered By: Rhae Hammock on 09/21/2022  09:37:02 -------------------------------------------------------------------------------- Pain Assessment Details Patient Name: Date of Service: [name], Brandy RO [name]. 09/21/2022 9:15 A M Medical Record Number: [id number] Patient Account Number: [id number] Date of Birth/Sex: Treating RN: Aug 19, 1946 (76 y.o. [name] Primary Care Masen Luallen: [name] Other Clinician: Referring Lenda Baratta: Treating Keliah Harned/Extender: Hollie Beach in Treatment: 17 Active Problems Location of Pain Severity and Description of Pain Patient Has Paino No Site Locations Rate the pain. Current Pain Level: 0 Pain Management and Medication Current Pain Management: Medication: No Cold Application: No Rest: No Massage: No Activity: No T.E.N.S.: No Heat Application: No Leg drop or elevation: No Is the Current Pain Management Adequate: Adequate How does your wound impact your activities of daily livingo Sleep: No Bathing: No Appetite: No Relationship With Others: No Bladder Continence: No Emotions: No Bowel Continence: No Work: No Toileting: No Drive: No Dressing: No Hobbies: No  Electronic Signature(s) Signed: 09/21/2022 4:59:17 PM By: [name] RN, BSN Entered By: [name] on 09/21/2022 09:17:46 Thomasene Mohair ([id number]) [id number] Page 7 of 8 -------------------------------------------------------------------------------- Patient/Caregiver Education Details Patient Name: Date of Service: [name] [facility] [name]. 12/4/2023andnbsp9:15 A M Medical Record Number: [id number] Patient Account Number: [id number] Date of Birth/Gender: Treating RN: 11-12-1945 (76 y.o. [name], [name] Primary Care Physician: [name] Other Clinician: Referring Physician: Treating Physician/Extender: Hollie Beach in Treatment: 72 Education Assessment Education Provided To: Patient Education Topics Provided Pain: Methods:  Explain/Verbal Responses: Reinforcements needed, State content correctly [employer]) Signed: 10/21/2022 5:35:40 PM By: Rhae Hammock RN Entered By: Rhae Hammock on 10/02/2022 11:25:32 -------------------------------------------------------------------------------- Wound Assessment Details Patient Name: Date of Service: [name]. 09/21/2022 9:15 A M Medical Record Number: [id number] Patient Account Number: [id number] Date of Birth/Sex: Treating RN: Dec 03, 1945 (76 y.o. [name], [name] Primary Care Alano Blasco: [name] Other Clinician: Referring Hanh Kertesz: Treating Haya Hemler/Extender: Hollie Beach in Treatment: 17 Wound Status Wound Number: 3 Primary Trauma, Other Etiology: Wound Location: Right, Lateral Lower Leg Wound Status: Open Wounding Event: Trauma Comorbid Cataracts, Anemia, Congestive Heart Failure, Hypertension, Date Acquired: 06/20/2022 History: Osteoarthritis Weeks Of Treatment: 12 Clustered Wound: No Photos Wound Measurements Length: (cm) 1.3 Width: (cm) 1.5 [name], [name] ([id number]) Depth: (cm) 0.2 Area: (cm) 1.532 Volume: (cm) 0.306 % Reduction in Area: 96.1% % Reduction in Volume: 96.1% [id number] Page 8 of 8 Epithelialization: Medium (34-66%) Tunneling: No Undermining: No Wound Description Classification: Full Thickness With Exposed Support Structures Wound Margin: Distinct, outline attached Exudate Amount: Medium Exudate Type: Serosanguineous Exudate Color: red, brown Foul Odor After Cleansing: No Slough/Fibrino Yes Wound Bed Granulation Amount: Large (67-100%) Exposed Structure Granulation Quality: Red, Pink Fascia Exposed: No Necrotic Amount: Small (1-33%) Fat Layer (Subcutaneous Tissue) Exposed: Yes Necrotic Quality: Adherent Slough Tendon Exposed: No Muscle Exposed: No Joint Exposed: No Bone Exposed: No Periwound Skin Texture Texture Color No Abnormalities  Noted: Yes No Abnormalities Noted: No Atrophie Blanche: No Moisture Cyanosis: No No Abnormalities Noted: Yes Ecchymosis: No Erythema: No Hemosiderin Staining: Yes Mottled: No Pallor: No Rubor: No Temperature / Pain Temperature: No Abnormality Electronic Signature(s) Signed: 09/21/2022 4:59:17 PM By: [name] RN, BSN Entered By: [name] on 09/21/2022 09:18:42 -------------------------------------------------------------------------------- Vitals Details Patient Name: Date of Service: [name], Brandy RO [name]. 09/21/2022 9:15 A M Medical Record Number: [id number] Patient Account Number: [id number] Date of Birth/Sex: Treating RN: October 21, 1945 (76 y.o. [name], [name] Primary Care Mikka Kissner: [name] Other Clinician: Referring Ally Knodel: Treating Matea Stanard/Extender: Hollie Beach in Treatment: 17 Vital Signs Time Taken: 09:19 Temperature (F): 97.8 Height (in): 63 Pulse (bpm): 67 Respiratory Rate (breaths/min): 20 Blood Pressure (mmHg): 155/82 Reference Range: 80 - 120 mg / dl Notes per patient has not taken her oral BP medications. Electronic Signature(s) Signed: 09/21/2022 4:59:17 PM By: [name] RN, BSN Entered By: [name] on 09/21/2022 09:19:08

## 2022-09-21 NOTE — Progress Notes (Addendum)
FLORIE, CARICO ([id number]) [id number] Page 1 of 9 Visit Report for 09/21/2022 Chief Complaint Document Details Patient Name: Date of Service: [name] [name] 09/21/2022 9:15 A M Medical Record Number: [id number] Patient Account Number: [id number] Date of Birth/Sex: Treating RN: 1946-06-15 (76 y.o. F) Primary Care Provider: [name] Other Clinician: Referring Provider: Treating Provider/Extender: Hollie Beach in Treatment: 17 Information Obtained from: Patient Chief Complaint 05/25/2022; left lower extremity wound following a dog scratch 06/23/2022; lacerations to the right and left lower extremity status post fall Electronic Signature(s) Signed: 09/21/2022 11:10:27 AM By: Kalman Shan DO Entered By: Kalman Shan on 09/21/2022 10:04:02 -------------------------------------------------------------------------------- HPI Details Patient Name: Date of Service: [name], [place] RO [name] 09/21/2022 9:15 A M Medical Record Number: [id number] Patient Account Number: [id number] Date of Birth/Sex: Treating RN: 1945-12-28 (76 y.o. F) Primary Care Provider: [name] Other Clinician: Referring Provider: Treating Provider/Extender: Hollie Beach in Treatment: 17 History of Present Illness HPI Description: Admission 05/25/2022 Ms. Margaretha Mahan is a 76 year old female with a past medical history of diet-controlled type 2 diabetes, chronic 3 stage kidney disease and venous insufficiency that presents to the clinic for a 1 month history of nonhealing ulcer to the left leg. She states that her dog scratched her causing a wound. She has developed cellulitis in this leg and has been treated with clindamycin by her primary care physician. She reports improvement in her symptoms. She currently denies signs of infection. She has been keeping the area covered. She does not wear compression stockings. She is on 80 mg of Lasix  twice daily. She has had reflux studies to the left leg on 07/2021 that notes venous reflux throughout the left common femoral vein and greater saphenous vein. She has not had an ablation. She follows with vein and vascular for her venous insufficiency. 8/14; patient presents for follow-up. She states that the wrap stayed on for 4 days and eventually slid down. She has been wearing her compression stocking and doing dressing changes with Hydrofera Blue since. She also states she has a hard time putting on her shoes with the 3 layer compression. She denies signs of infection. 8/21; patient presents for follow-up. She again had trouble with the wrap sliding down. We have been using Hydrofera Blue with gentamicin under 2 layer Coflex. She currently denies signs of infection. 8/28; patient presents for follow-up. She reports taking the wrap off yesterday. We have been using Hydrofera Blue with gentamicin under 2 layer Coflex. She states that the wrap does slide down but remains over the wound. She is currently wearing her compression stocking. She would like to do this instead of the compression wrap. 9/5; patient presents for follow-up. She has been using Medihoney to the left lower extremity remedy wound with her compression stockings. She reports no drainage from the site. Unfortunately she fell over the weekend and had to go to the ED due to lacerations she experienced on her left and right lower extremity. On the left leg she had 10 sutures placed on the right leg she had 7. She was given Keflex. Today she reports no signs of infection. She has been using Xeroform to the suture sites. 9/11; patient presents for follow-up. She has been using bacitracin ointment to the suture line. She currently denies signs of infection. 9/19; patient presents for follow-up. She has been using Medihoney to the wound beds. She denies signs of infection. 9/26; patient presents for follow-up. She has been  using Dakin's  wet-to-dry dressings to the wound beds. She reports improvement in wound healing. She has no issues or complaints today. LOIE, JAHR ([id number]) [id number] Page 2 of 9 10/3; patient presents for follow-up. We had used compression therapy along with Hydrofera Blue to the right lower extremity. She states she felt uncomfortable with the wrap and took it off 3 days ago. She has been using Medihoney without issues to the left lower extremity wound. She denies signs of infection. 10/10; patient presents for follow-up. She has been using Hydrofera Blue and Medihoney to the left lower extremity wound. We have been using Hydrofera Blue and Santyl under compression therapy to the right lower extremity. She states that the wrap slid down 2 days ago and she took it off. She has no issues or complaints today. She denies signs of infection. 10/17; patient presents for follow-up. We have been using Hydrofera Blue to the left lower extremity wounds and Hydrofera Blue and Santyl under compression therapy to the right lower extremity. She has no issues or complaints today. She denies signs of infection. 10/23; patient presents for follow-up. We have been using silver alginate to the left lower extremity and Hydrofera Blue and Santyl under Kerlix/Coban to the right lower extremity. It is unclear which she is using to the left leg. She denies signs of infection. She has no issues or complaints today. 10/30; patient presents for follow-up. We have been using Hydrofera Blue and Santyl under Kerlix/Coban to the right lower extremity and silver alginate with Medihoney to the left lower extremity under compression stocking. She has no issues or complaints today. 11/13; patient presents for follow-up. We have been using Hydrofera Blue and Santyl under Kerlix/Coban to the right lower extremity. We have been using Medihoney and silver alginate to the left lower extremity wounds. This area is  healed. She has no issues or complaints today. 11/20; patient presents for follow-up. We have been using Hydrofera Blue and Santyl under Kerlix/Coban to the right lower extremity. She states that she took the wrap off yesterday Due to the drainage coming through from the wound bed. 11/28; patient presents for follow-up. She took the wrap off over the weekend. She does this often. He has no issues or complaints today. We have been using silver alginate with antibiotic ointment under compression therapy. 12/4; patient presents for follow-up. She took the wrap off over the weekend. We have been using silver alginate with antibiotic ointment under compression therapy. Electronic Signature(s) Signed: 09/21/2022 11:10:27 AM By: Kalman Shan DO Entered By: Kalman Shan on 09/21/2022 10:04:25 -------------------------------------------------------------------------------- Physical Exam Details Patient Name: Date of Service: [name] RO [name] 09/21/2022 9:15 A M Medical Record Number: [id number] Patient Account Number: [id number] Date of Birth/Sex: Treating RN: 22-Jun-1946 (76 y.o. F) Primary Care Provider: [name] Other Clinician: Referring Provider: Treating Provider/Extender: Hollie Beach in Treatment: 17 Constitutional respirations regular, non-labored and within target range for patient.. Cardiovascular 2+ dorsalis pedis/posterior tibialis pulses. Psychiatric pleasant and cooperative. Notes Right lower extremity: Granulation tissue with scant tightly adhered nonviable tissue. 2+ pitting edema to the knee. No signs of infection including increased warmth, erythema or purulent drainage. Electronic Signature(s) Signed: 09/21/2022 11:10:27 AM By: Kalman Shan DO Entered By: Kalman Shan on 09/21/2022 10:05:00 -------------------------------------------------------------------------------- Physician Orders Details Patient Name: Date of  Service: [name], [place] RO [name] 09/21/2022 9:15 A Richardson Chiquito ([id number]) [id number] Page 3 of 9 Medical Record Number: [id number] Patient Account Number: [id number] Date  of Birth/Sex: Treating RN: May 23, 1946 (76 y.o. [name], [name] Primary Care Provider: [name] Other Clinician: Referring Provider: Treating Provider/Extender: Hollie Beach in Treatment: [phone] Verbal / Phone Orders: No Diagnosis Coding Follow-up Appointments ppointment in 1 week. - w/ Dr. Heber [place] Return A ppointment in 2 weeks. - Dr. Heber [place] Return A Anesthetic (In clinic) Topical Lidocaine 5% applied to wound bed Bathing/ Shower/ Hygiene May shower with protection but do not get wound dressing(s) wet. Edema Control - Lymphedema / SCD / Other Elevate legs to the level of the heart or above for 30 minutes daily and/or when sitting, a frequency of: - 2-3 times a day throughout the day. Avoid standing for long periods of time. Exercise regularly Moisturize legs daily. - lotion both legs every night before bed. Compression stocking or Garment 20-30 mm/Hg pressure to: - apply in the morning and remove at night. Wound Treatment Wound #3 - Lower Leg Wound Laterality: Right, Lateral Cleanser: Soap and Water 1 x Per Week/30 Days Discharge Instructions: May shower and wash wound with dial antibacterial soap and water prior to dressing change. Peri-Wound Care: Sween Lotion (Moisturizing lotion) 1 x Per Week/30 Days Discharge Instructions: Apply moisturizing lotion as directed Topical: Gentamicin 1 x Per Week/30 Days Discharge Instructions: As directed by physician Topical: Mupirocin Ointment 1 x Per Week/30 Days Discharge Instructions: Apply Mupirocin (Bactroban) as instructed Prim Dressing: KerraCel Ag Gelling Fiber Dressing, 4x5 in (silver alginate) 1 x Per Week/30 Days ary Discharge Instructions: Apply silver alginate to wound bed as instructed Secondary  Dressing: Woven Gauze Sponges 2x2 in 1 x Per Week/30 Days Discharge Instructions: Apply over primary dressing as directed. Secondary Dressing: Zetuvit Plus 4x8 in 1 x Per Week/30 Days Discharge Instructions: Apply over primary dressing as directed. Compression Wrap: Kerlix Roll 4.5x3.1 (in/yd) 1 x Per Week/30 Days Discharge Instructions: Apply Kerlix and Coban compression as directed. Compression Wrap: Coban Self-Adherent Wrap 4x5 (in/yd) 1 x Per Week/30 Days Discharge Instructions: Apply over Kerlix as directed. Electronic Signature(s) Signed: 09/21/2022 11:10:27 AM By: Kalman Shan DO Entered By: Kalman Shan on 09/21/2022 10:05:13 -------------------------------------------------------------------------------- Problem List Details Patient Name: Date of Service: [name], [place] RO [name] 09/21/2022 9:15 A M Medical Record Number: [id number] Patient Account Number: [id number] Date of Birth/Sex: Treating RN: 02-02-1946 (76 y.o. 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/04/2022 No Yes L97.812 Non-pressure chronic ulcer of other part of right lower leg with fat layer 07/14/2022 No Yes exposed I87.313 Chronic venous hypertension (idiopathic) with ulcer of bilateral lower extremity 07/14/2022 No Yes E11.622 Type 2 diabetes mellitus with other skin ulcer 05/25/2022 No Yes T79.8XXA Other early complications of trauma, initial encounter 06/23/2022 No Yes S81.801A Unspecified open wound, right lower leg, initial encounter 06/23/2022 No Yes S81.802A Unspecified open wound, left lower leg, initial encounter 06/23/2022 No Yes Inactive  Problems Resolved Problems ICD-10 Code Description Active Date Resolved Date W54.8XXA Other contact with dog, initial encounter 05/25/2022 05/25/2022 Electronic Signature(s) Signed: 09/21/2022 11:10:27 AM By: Kalman Shan DO Entered By: Kalman Shan on 09/21/2022 10:03:47 -------------------------------------------------------------------------------- Progress Note Details Patient Name: Date of Service: [name], [place] RO [name] 09/21/2022 9:15 A M Medical Record Number: [id number] Patient Account Number: [id number] Date of Birth/Sex: Treating RN:  [birth year] (76 y.o. F) Primary Care Provider: [name] Other Clinician: Referring Provider: Treating Provider/Extender: Hollie Beach in Treatment: 64 Subjective Chief Complaint Information obtained from Patient 05/25/2022; left lower extremity wound following a dog scratch 06/23/2022; lacerations to the right and left lower extremity status post fall History of Present Illness (HPI) SHERLE, MELLO ([id number]) [id number] Page 5 of 9 Admission 05/25/2022 Ms. Feven Alderfer is a 76 year old female with a past medical history of diet-controlled type 2 diabetes, chronic 3 stage kidney disease and venous insufficiency that presents to the clinic for a 1 month history of nonhealing ulcer to the left leg. She states that her dog scratched her causing a wound. She has developed cellulitis in this leg and has been treated with clindamycin by her primary care physician. She reports improvement in her symptoms. She currently denies signs of infection. She has been keeping the area covered. She does not wear compression stockings. She is on 80 mg of Lasix twice daily. She has had reflux studies to the left leg on 07/2021 that notes venous reflux throughout the left common femoral vein and greater saphenous vein. She has not had an ablation. She follows with vein and vascular for her venous  insufficiency. 8/14; patient presents for follow-up. She states that the wrap stayed on for 4 days and eventually slid down. She has been wearing her compression stocking and doing dressing changes with Hydrofera Blue since. She also states she has a hard time putting on her shoes with the 3 layer compression. She denies signs of infection. 8/21; patient presents for follow-up. She again had trouble with the wrap sliding down. We have been using Hydrofera Blue with gentamicin under 2 layer Coflex. She currently denies signs of infection. 8/28; patient presents for follow-up. She reports taking the wrap off yesterday. We have been using Hydrofera Blue with gentamicin under 2 layer Coflex. She states that the wrap does slide down but remains over the wound. She is currently wearing her compression stocking. She would like to do this instead of the compression wrap. 9/5; patient presents for follow-up. She has been using Medihoney to the left lower extremity remedy wound with her compression stockings. She reports no drainage from the site. Unfortunately she fell over the weekend and had to go to the ED due to lacerations she experienced on her left and right lower extremity. On the left leg she had 10 sutures placed on the right leg she had 7. She was given Keflex. Today she reports no signs of infection. She has been using Xeroform to the suture sites. 9/11; patient presents for follow-up. She has been using bacitracin ointment to the suture line. She currently denies signs of infection. 9/19; patient presents for follow-up. She has been using Medihoney to the wound beds. She denies signs of infection. 9/26; patient presents for follow-up. She has been using Dakin's wet-to-dry dressings to the wound beds. She reports improvement in wound healing. She has no issues or complaints today. 10/3; patient presents for follow-up. We had used compression therapy along with Hydrofera Blue to the right lower  extremity. She states she felt uncomfortable with the wrap and took it off 3 days ago. She has been using Medihoney without issues to the left lower extremity wound. She denies signs of infection. 10/10; patient presents for follow-up. She has been using Hydrofera Blue and Medihoney to the left lower extremity wound. We have been using Hydrofera Blue and Santyl under compression therapy to the right  lower extremity. She states that the wrap slid down 2 days ago and she took it off. She has no issues or complaints today. She denies signs of infection. 10/17; patient presents for follow-up. We have been using Hydrofera Blue to the left lower extremity wounds and Hydrofera Blue and Santyl under compression therapy to the right lower extremity. She has no issues or complaints today. She denies signs of infection. 10/23; patient presents for follow-up. We have been using silver alginate to the left lower extremity and Hydrofera Blue and Santyl under Kerlix/Coban to the right lower extremity. It is unclear which she is using to the left leg. She denies signs of infection. She has no issues or complaints today. 10/30; patient presents for follow-up. We have been using Hydrofera Blue and Santyl under Kerlix/Coban to the right lower extremity and silver alginate with Medihoney to the left lower extremity under compression stocking. She has no issues or complaints today. 11/13; patient presents for follow-up. We have been using Hydrofera Blue and Santyl under Kerlix/Coban to the right lower extremity. We have been using Medihoney and silver alginate to the left lower extremity wounds. This area is healed. She has no issues or complaints today. 11/20; patient presents for follow-up. We have been using Hydrofera Blue and Santyl under Kerlix/Coban to the right lower extremity. She states that she took the wrap off yesterday Due to the drainage coming through from the wound bed. 11/28; patient presents for  follow-up. She took the wrap off over the weekend. She does this often. He has no issues or complaints today. We have been using silver alginate with antibiotic ointment under compression therapy. 12/4; patient presents for follow-up. She took the wrap off over the weekend. We have been using silver alginate with antibiotic ointment under compression therapy. Patient History Information obtained from Patient. Family History Cancer - Siblings, Diabetes - Siblings, Heart Disease - Mother, Hypertension - Mother, No family history of Hereditary Spherocytosis, Kidney Disease, Lung Disease, Seizures, Stroke, Thyroid Problems, Tuberculosis. Social History Never smoker, Marital Status - Married, Alcohol Use - Never, Drug Use - No History, Caffeine Use - Moderate. Medical History Eyes Patient has history of Cataracts Denies history of Glaucoma Ear/Nose/Mouth/Throat Denies history of Chronic sinus problems/congestion, Middle ear problems Hematologic/Lymphatic Patient has history of Anemia Denies history of Hemophilia, Human Immunodeficiency Virus, Lymphedema, Sickle Cell Disease Respiratory Denies history of Aspiration, Asthma, Chronic Obstructive Pulmonary Disease (COPD), Pneumothorax, Sleep Apnea, Tuberculosis Cardiovascular Patient has history of Congestive Heart Failure, Hypertension Denies history of Angina, Arrhythmia, Coronary Artery Disease, Deep Vein Thrombosis, Hypotension, Myocardial Infarction, Peripheral Arterial Disease, Peripheral Venous Disease, Phlebitis, Vasculitis Gastrointestinal Denies history of Cirrhosis , Colitis, Crohnoos, Hepatitis A, Hepatitis B, Hepatitis C Endocrine Denies history of Type I Diabetes, Type II Diabetes Genitourinary Denies history of End Stage Renal Disease PAETYN, PIETRZAK B ([id number]) [id number] Page 6 of 9 Immunological Denies history of Lupus Erythematosus, Raynaudoos, Scleroderma Integumentary (Skin) Denies  history of History of Burn Musculoskeletal Patient has history of Osteoarthritis Denies history of Gout, Rheumatoid Arthritis, Osteomyelitis Neurologic Denies history of Dementia, Neuropathy, Quadriplegia, Paraplegia, Seizure Disorder Oncologic Denies history of Received Chemotherapy Psychiatric Denies history of Anorexia/bulimia, Confinement Anxiety Hospitalization/Surgery History - right total knee replacement. Medical A Surgical History Notes nd Gastrointestinal diverticulosis hiatal hernia hyperlipidemia Musculoskeletal DJD Objective Constitutional respirations regular, non-labored and within target range for patient.. Vitals Time Taken: 9:19 AM, Height: 63 in, Temperature: 97.8 F, Pulse: 67 bpm, Respiratory Rate: 20 breaths/min, Blood Pressure: 155/82 mmHg. General Notes: per  patient has not taken her oral BP medications. Cardiovascular 2+ dorsalis pedis/posterior tibialis pulses. Psychiatric pleasant and cooperative. General Notes: Right lower extremity: Granulation tissue with scant tightly adhered nonviable tissue. 2+ pitting edema to the knee. No signs of infection including increased warmth, erythema or purulent drainage. Integumentary (Hair, Skin) Wound #3 status is Open. Original cause of wound was Trauma. The date acquired was: 06/20/2022. The wound has been in treatment 12 weeks. The wound is located on the Right,Lateral Lower Leg. The wound measures 1.3cm length x 1.5cm width x 0.2cm depth; 1.532cm^2 area and 0.306cm^3 volume. There is Fat Layer (Subcutaneous Tissue) exposed. There is no tunneling or undermining noted. There is a medium amount of serosanguineous drainage noted. The wound margin is distinct with the outline attached to the wound base. There is large (67-100%) red, pink granulation within the wound bed. There is a small (1-33%) amount of necrotic tissue within the wound bed including Adherent Slough. The periwound skin appearance had no abnormalities  noted for texture. The periwound skin appearance had no abnormalities noted for moisture. The periwound skin appearance exhibited: Hemosiderin Staining. The periwound skin appearance did not exhibit: Atrophie Blanche, Cyanosis, Ecchymosis, Mottled, Pallor, Rubor, Erythema. Periwound temperature was noted as No Abnormality. Assessment Active Problems ICD-10 Non-pressure chronic ulcer of other part of left lower leg with fat layer exposed Non-pressure chronic ulcer of other part of right lower leg with fat layer exposed Chronic venous hypertension (idiopathic) with ulcer of bilateral lower extremity Type 2 diabetes mellitus with other skin ulcer Other early complications of trauma, initial encounter Unspecified open wound, right lower leg, initial encounter Unspecified open wound, left lower leg, initial encounter Patient's wound has shown improvement in size appearance since last clinic visit. I recommended continuing the course with antibiotic ointment and silver alginate under compression therapy. Follow-up in 1 week. Plan Follow-up Appointments: ICIS, BUDREAU ([id number]) [id number] Page 7 of 9 Return Appointment in 1 week. - w/ Dr. Heber [place] Return Appointment in 2 weeks. - Dr. Heber [place] Anesthetic: (In clinic) Topical Lidocaine 5% applied to wound bed Bathing/ Shower/ Hygiene: May shower with protection but do not get wound dressing(s) wet. Edema Control - Lymphedema / SCD / Other: Elevate legs to the level of the heart or above for 30 minutes daily and/or when sitting, a frequency of: - 2-3 times a day throughout the day. Avoid standing for long periods of time. Exercise regularly Moisturize legs daily. - lotion both legs every night before bed. Compression stocking or Garment 20-30 mm/Hg pressure to: - apply in the morning and remove at night. WOUND #3: - Lower Leg Wound Laterality: Right, Lateral Cleanser: Soap and Water 1 x Per Week/30  Days Discharge Instructions: May shower and wash wound with dial antibacterial soap and water prior to dressing change. Peri-Wound Care: Sween Lotion (Moisturizing lotion) 1 x Per Week/30 Days Discharge Instructions: Apply moisturizing lotion as directed Topical: Gentamicin 1 x Per Week/30 Days Discharge Instructions: As directed by physician Topical: Mupirocin Ointment 1 x Per Week/30 Days Discharge Instructions: Apply Mupirocin (Bactroban) as instructed Prim Dressing: KerraCel Ag Gelling Fiber Dressing, 4x5 in (silver alginate) 1 x Per Week/30 Days ary Discharge Instructions: Apply silver alginate to wound bed as instructed Secondary Dressing: Woven Gauze Sponges 2x2 in 1 x Per Week/30 Days Discharge Instructions: Apply over primary dressing as directed. Secondary Dressing: Zetuvit Plus 4x8 in 1 x Per Week/30 Days Discharge Instructions: Apply over primary dressing as directed. Com pression Wrap: Kerlix Roll 4.5x3.1 (in/yd) 1 x Per  Week/30 Days Discharge Instructions: Apply Kerlix and Coban compression as directed. Com pression Wrap: Coban Self-Adherent Wrap 4x5 (in/yd) 1 x Per Week/30 Days Discharge Instructions: Apply over Kerlix as directed. 1. Silver alginate with antibiotic ointment under Kerlix/Coban 2. Follow-up in 1 week Electronic Signature(s) Signed: 09/21/2022 11:10:27 AM By: Kalman Shan DO Entered By: Kalman Shan on 09/21/2022 10:05:48 -------------------------------------------------------------------------------- HxROS Details Patient Name: Date of Service: [name], [place] RO [name] 09/21/2022 9:15 A M Medical Record Number: [id number] Patient Account Number: [id number] Date of Birth/Sex: Treating RN: 25-Mar-1946 (76 y.o. F) Primary Care Provider: [name] Other Clinician: Referring Provider: Treating Provider/Extender: Hollie Beach in Treatment: 17 Information Obtained From Patient Eyes Medical History: Positive for:  Cataracts Negative for: Glaucoma Ear/Nose/Mouth/Throat Medical History: Negative for: Chronic sinus problems/congestion; Middle ear problems Hematologic/Lymphatic Medical History: Positive for: Anemia Negative for: Hemophilia; Human Immunodeficiency Virus; Lymphedema; Sickle Cell Disease Respiratory Medical HistoryLILYANAH, CELESTIN ([id number]) [id number] Page 8 of 9 Negative for: Aspiration; Asthma; Chronic Obstructive Pulmonary Disease (COPD); Pneumothorax; Sleep Apnea; Tuberculosis Cardiovascular Medical History: Positive for: Congestive Heart Failure; Hypertension Negative for: Angina; Arrhythmia; Coronary Artery Disease; Deep Vein Thrombosis; Hypotension; Myocardial Infarction; Peripheral Arterial Disease; Peripheral Venous Disease; Phlebitis; Vasculitis Gastrointestinal Medical History: Negative for: Cirrhosis ; Colitis; Crohns; Hepatitis A; Hepatitis B; Hepatitis C Past Medical History Notes: diverticulosis hiatal hernia hyperlipidemia Endocrine Medical History: Negative for: Type I Diabetes; Type II Diabetes Genitourinary Medical History: Negative for: End Stage Renal Disease Immunological Medical History: Negative for: Lupus Erythematosus; Raynauds; Scleroderma Integumentary (Skin) Medical History: Negative for: History of Burn Musculoskeletal Medical History: Positive for: Osteoarthritis Negative for: Gout; Rheumatoid Arthritis; Osteomyelitis Past Medical History Notes: DJD Neurologic Medical History: Negative for: Dementia; Neuropathy; Quadriplegia; Paraplegia; Seizure Disorder Oncologic Medical History: Negative for: Received Chemotherapy Psychiatric Medical History: Negative for: Anorexia/bulimia; Confinement Anxiety HBO Extended History Items Eyes: Cataracts Immunizations Pneumococcal Vaccine: Received Pneumococcal Vaccination: Yes Received Pneumococcal Vaccination On or After 60th Birthday: Yes Implantable  Devices None Hospitalization / Surgery History Type of Hospitalization/Surgery right total knee replacement MALIEA, GRANDMAISON B ([id number]) [id number] Page 9 of 9 Family and Social History Cancer: Yes - Siblings; Diabetes: Yes - Siblings; Heart Disease: Yes - Mother; Hereditary Spherocytosis: No; Hypertension: Yes - Mother; Kidney Disease: No; Lung Disease: No; Seizures: No; Stroke: No; Thyroid Problems: No; Tuberculosis: No; Never smoker; Marital Status - Married; Alcohol Use: Never; Drug Use: No History; Caffeine Use: Moderate; Financial Concerns: No; Food, Clothing or Shelter Needs: No; Support System Lacking: No; Transportation Concerns: No Electronic Signature(s) Signed: 09/21/2022 11:10:27 AM By: Kalman Shan DO Entered By: Kalman Shan on 09/21/2022 10:04:30 -------------------------------------------------------------------------------- SuperBill Details Patient Name: Date of Service: [name], [place] RO [name] 09/21/2022 Medical Record Number: [id number] Patient Account Number: [id number] Date of Birth/Sex: Treating RN: 10/04/1946 (76 y.o. F) Primary Care Provider: [name] Other Clinician: Referring Provider: Treating Provider/Extender: Hollie Beach in Treatment: 17 Diagnosis Coding ICD-10 Codes Code Description [id number] Non-pressure chronic ulcer of other part of left lower leg with fat layer exposed L97.812 Non-pressure chronic ulcer of other part of right lower leg with fat layer exposed I87.313 Chronic venous hypertension (idiopathic) with ulcer of bilateral lower extremity E11.622 Type 2 diabetes mellitus with other skin ulcer T79.8XXA Other early complications of trauma, initial encounter S81.801A Unspecified open wound, right lower leg, initial encounter S81.802A Unspecified open wound, left lower leg, initial encounter Facility Procedures : CPT4 Code: [id number] Description: 99213 - WOUND CARE VISIT-LEV 3 EST  PT Modifier: Quantity:  1 Physician Procedures : CPT4 Code Description Modifier [id number] - WC PHYS LEVEL 3 - EST PT ICD-10 Diagnosis Description S82.707 Non-pressure chronic ulcer of other part of right lower leg with fat layer exposed I87.313 Chronic venous hypertension (idiopathic) with ulcer  of bilateral lower extremity E11.622 Type 2 diabetes mellitus with other skin ulcer Quantity: 1 Electronic Signature(s) Signed: 10/13/2022 3:20:18 PM By: Kalman Shan DO Signed: 10/21/2022 5:35:40 PM By: Rhae Hammock RN Previous Signature: 09/21/2022 11:10:27 AM Version By: Kalman Shan DO Entered By: Rhae Hammock on 10/02/2022 11:34:25

## 2022-09-22 ENCOUNTER — Other Ambulatory Visit: Payer: Self-pay | Admitting: Internal Medicine

## 2022-09-22 NOTE — Telephone Encounter (Signed)
Please refill as per office routine med refill policy (all routine meds to be refilled for 3 mo or monthly (per pt preference) up to one year from last visit, then month to month grace period for 3 mo, then further med refills will have to be denied) ? ?

## 2022-09-23 ENCOUNTER — Other Ambulatory Visit: Payer: Self-pay | Admitting: Internal Medicine

## 2022-09-23 NOTE — Telephone Encounter (Signed)
Patient needs a follow up with Dr Hilarie Fredrickson

## 2022-09-29 ENCOUNTER — Encounter (HOSPITAL_BASED_OUTPATIENT_CLINIC_OR_DEPARTMENT_OTHER): Payer: Medicare HMO | Admitting: Internal Medicine

## 2022-09-29 DIAGNOSIS — E11622 Type 2 diabetes mellitus with other skin ulcer: Secondary | ICD-10-CM

## 2022-09-29 DIAGNOSIS — I87313 Chronic venous hypertension (idiopathic) with ulcer of bilateral lower extremity: Secondary | ICD-10-CM

## 2022-09-29 DIAGNOSIS — N183 Chronic kidney disease, stage 3 unspecified: Secondary | ICD-10-CM | POA: Diagnosis not present

## 2022-09-29 DIAGNOSIS — L97812 Non-pressure chronic ulcer of other part of right lower leg with fat layer exposed: Secondary | ICD-10-CM

## 2022-09-29 DIAGNOSIS — I87311 Chronic venous hypertension (idiopathic) with ulcer of right lower extremity: Secondary | ICD-10-CM | POA: Diagnosis not present

## 2022-09-29 DIAGNOSIS — T798XXA Other early complications of trauma, initial encounter: Secondary | ICD-10-CM | POA: Diagnosis not present

## 2022-09-29 DIAGNOSIS — E1122 Type 2 diabetes mellitus with diabetic chronic kidney disease: Secondary | ICD-10-CM | POA: Diagnosis not present

## 2022-09-29 NOTE — Progress Notes (Signed)
[name] ([id number]) [id number] Page 1 of 10 Visit Report for 09/29/2022 Chief Complaint Document Details Patient Name: Date of Service: [name] [name] 09/29/2022 9:30 A M Medical Record Number: [id number] Patient Account Number: [id number] Date of Birth/Sex: Treating RN: 03-04-1946 (76 y.o. F) Primary Care Provider: [name] Other Clinician: Referring Provider: Treating Provider/Extender: Hollie Beach in Treatment: 18 Information Obtained from: Patient Chief Complaint 05/25/2022; left lower extremity wound following a dog scratch 06/23/2022; lacerations to the right and left lower extremity status post fall Electronic Signature(s) Signed: 09/29/2022 12:07:10 PM By: Kalman Shan DO Entered By: Kalman Shan on 09/29/2022 10:44:48 -------------------------------------------------------------------------------- HPI Details Patient Name: Date of Service: [name], Brandy RO [name] 09/29/2022 9:30 A M Medical Record Number: [id number] Patient Account Number: [id number] Date of Birth/Sex: Treating RN: 1946/10/06 (76 y.o. F) Primary Care Provider: [name] Other Clinician: Referring Provider: Treating Provider/Extender: Hollie Beach in Treatment: 18 History of Present Illness HPI Description: Admission 05/25/2022 [name] is a 76 year old female with a past medical history of diet-controlled type 2 diabetes, chronic 3 stage kidney disease and venous insufficiency that presents to the clinic for a 1 month history of nonhealing ulcer to the left leg. She states that her dog scratched her causing a wound. She has developed cellulitis in this leg and has been treated with clindamycin by her primary care physician. She reports improvement in her symptoms. She currently denies signs of infection. She has been keeping the area covered. She does not wear compression stockings. She is on 80 mg of  Lasix twice daily. She has had reflux studies to the left leg on 07/2021 that notes venous reflux throughout the left common femoral vein and greater saphenous vein. She has not had an ablation. She follows with vein and vascular for her venous insufficiency. 8/14; patient presents for follow-up. She states that the wrap stayed on for 4 days and eventually slid down. She has been wearing her compression stocking and doing dressing changes with Hydrofera Blue since. She also states she has a hard time putting on her shoes with the 3 layer compression. She denies signs of infection. 8/21; patient presents for follow-up. She again had trouble with the wrap sliding down. We have been using Hydrofera Blue with gentamicin under 2 layer Coflex. She currently denies signs of infection. 8/28; patient presents for follow-up. She reports taking the wrap off yesterday. We have been using Hydrofera Blue with gentamicin under 2 layer Coflex. She states that the wrap does slide down but remains over the wound. She is currently wearing her compression stocking. She would like to do this instead of the compression wrap. 9/5; patient presents for follow-up. She has been using Medihoney to the left lower extremity remedy wound with her compression stockings. She reports no drainage from the site. Unfortunately she fell over the weekend and had to go to the ED due to lacerations she experienced on her left and right lower extremity. On the left leg she had 10 sutures placed on the right leg she had 7. She was given Keflex. Today she reports no signs of infection. She has been using Xeroform to the suture sites. 9/11; patient presents for follow-up. She has been using bacitracin ointment to the suture line. She currently denies signs of infection. 9/19; patient presents for follow-up. She has been using Medihoney to the wound beds. She denies signs of infection. 9/26; patient presents for follow-up. She has been  using  Dakin's wet-to-dry dressings to the wound beds. She reports improvement in wound healing. She has no issues or complaints today. [name] ([id number]) [id number] Page 2 of 10 10/3; patient presents for follow-up. We had used compression therapy along with Hydrofera Blue to the right lower extremity. She states she felt uncomfortable with the wrap and took it off 3 days ago. She has been using Medihoney without issues to the left lower extremity wound. She denies signs of infection. 10/10; patient presents for follow-up. She has been using Hydrofera Blue and Medihoney to the left lower extremity wound. We have been using Hydrofera Blue and Santyl under compression therapy to the right lower extremity. She states that the wrap slid down 2 days ago and she took it off. She has no issues or complaints today. She denies signs of infection. 10/17; patient presents for follow-up. We have been using Hydrofera Blue to the left lower extremity wounds and Hydrofera Blue and Santyl under compression therapy to the right lower extremity. She has no issues or complaints today. She denies signs of infection. 10/23; patient presents for follow-up. We have been using silver alginate to the left lower extremity and Hydrofera Blue and Santyl under Kerlix/Coban to the right lower extremity. It is unclear which she is using to the left leg. She denies signs of infection. She has no issues or complaints today. 10/30; patient presents for follow-up. We have been using Hydrofera Blue and Santyl under Kerlix/Coban to the right lower extremity and silver alginate with Medihoney to the left lower extremity under compression stocking. She has no issues or complaints today. 11/13; patient presents for follow-up. We have been using Hydrofera Blue and Santyl under Kerlix/Coban to the right lower extremity. We have been using Medihoney and silver alginate to the left lower extremity wounds. This  area is healed. She has no issues or complaints today. 11/20; patient presents for follow-up. We have been using Hydrofera Blue and Santyl under Kerlix/Coban to the right lower extremity. She states that she took the wrap off yesterday Due to the drainage coming through from the wound bed. 11/28; patient presents for follow-up. She took the wrap off over the weekend. She does this often. He has no issues or complaints today. We have been using silver alginate with antibiotic ointment under compression therapy. 12/4; patient presents for follow-up. She took the wrap off over the weekend. We have been using silver alginate with antibiotic ointment under compression therapy. 12/12; patient presents for follow-up. She again took the wrap off over the weekend. She reports minimal drainage. We have been using silver alginate with antibiotic ointment under compression therapy. it is unclear if she is using a dressing when she takes the wrap off. She is using bandages to keep the area covered. Electronic Signature(s) Signed: 09/29/2022 12:07:10 PM By: Kalman Shan DO Entered By: Kalman Shan on 09/29/2022 10:48:22 -------------------------------------------------------------------------------- Physical Exam Details Patient Name: Date of Service: Brandy Busing RO [name] 09/29/2022 9:30 A M Medical Record Number: [id number] Patient Account Number: [id number] Date of Birth/Sex: Treating RN: [birth year] (76 y.o. F) Primary Care Provider: [name] Other Clinician: Referring Provider: Treating Provider/Extender: Hollie Beach in Treatment: 18 Constitutional respirations regular, non-labored and within target range for patient.. Cardiovascular 2+ dorsalis pedis/posterior tibialis pulses. Psychiatric pleasant and cooperative. Notes Right lower extremity: Granulation tissue with scant tightly adhered nonviable tissue. 2+ pitting edema to the knee. No signs of infection  including increased warmth, erythema or purulent drainage.  Electronic Signature(s) Signed: 09/29/2022 12:07:10 PM By: Kalman Shan DO Entered By: Kalman Shan on 09/29/2022 10:51:34 [name] ([id number]) [id number] Page 3 of 10 -------------------------------------------------------------------------------- Physician Orders Details Patient Name: Date of Service: [name] [name] 09/29/2022 9:30 A M Medical Record Number: [id number] Patient Account Number: [id number] Date of Birth/Sex: Treating RN: 1946-04-28 (76 y.o. [name], [name] Primary Care Provider: [name] Other Clinician: Referring Provider: Treating Provider/Extender: Hollie Beach in Treatment: 18 Verbal / Phone Orders: No Diagnosis Coding ICD-10 Coding Code Description [phone] Non-pressure chronic ulcer of other part of left lower leg with fat layer exposed L97.812 Non-pressure chronic ulcer of other part of right lower leg with fat layer exposed I87.313 Chronic venous hypertension (idiopathic) with ulcer of bilateral lower extremity E11.622 Type 2 diabetes mellitus with other skin ulcer T79.8XXA Other early complications of trauma, initial encounter S81.801A Unspecified open wound, right lower leg, initial encounter S81.802A Unspecified open wound, left lower leg, initial encounter Follow-up Appointments ppointment in 1 week. - w/ Dr. Heber [place] Return A ppointment in 2 weeks. - Dr. Heber [place] Return A Anesthetic (In clinic) Topical Lidocaine 5% applied to wound bed Bathing/ Shower/ Hygiene May shower with protection but do not get wound dressing(s) wet. Edema Control - Lymphedema / SCD / Other Elevate legs to the level of the heart or above for 30 minutes daily and/or when sitting, a frequency of: - 2-3 times a day throughout the day. Avoid standing for long periods of time. Exercise regularly Moisturize legs daily. - lotion both legs every  night before bed. Compression stocking or Garment 20-30 mm/Hg pressure to: - apply in the morning and remove at night. Wound Treatment Wound #3 - Lower Leg Wound Laterality: Right, Lateral Cleanser: Soap and Water 1 x Per Week/30 Days Discharge Instructions: May shower and wash wound with dial antibacterial soap and water prior to dressing change. Peri-Wound Care: Sween Lotion (Moisturizing lotion) 1 x Per Week/30 Days Discharge Instructions: Apply moisturizing lotion as directed Topical: Gentamicin 1 x Per Week/30 Days Discharge Instructions: As directed by physician Topical: Mupirocin Ointment 1 x Per Week/30 Days Discharge Instructions: Apply Mupirocin (Bactroban) as instructed Prim Dressing: Hydrofera Blue Ready Transfer Foam, 2.5x2.5 (in/in) 1 x Per Week/30 Days ary Discharge Instructions: Apply directly to wound bed as directed Secondary Dressing: Woven Gauze Sponges 2x2 in 1 x Per Week/30 Days Discharge Instructions: Apply over primary dressing as directed. Compression Wrap: Kerlix Roll 4.5x3.1 (in/yd) 1 x Per Week/30 Days Discharge Instructions: Apply Kerlix and Coban compression as directed. Compression Wrap: Coban Self-Adherent Wrap 4x5 (in/yd) 1 x Per Week/30 Days Discharge Instructions: Apply over Kerlix as directed. Electronic Signature(s) Signed: 09/29/2022 12:07:10 PM By: Kalman Shan DO Entered By: Kalman Shan on 09/29/2022 10:51:47 [name] ([id number]) [id number] Page 4 of 10 -------------------------------------------------------------------------------- Problem List Details Patient Name: Date of Service: [name] [name] 09/29/2022 9:30 A M Medical Record Number: [id number] Patient Account Number: [id number] Date of Birth/Sex: Treating RN: [birth year] (76 y.o. [name], [name] Primary Care Provider: [name] Other Clinician: Referring Provider: Treating Provider/Extender: Hollie Beach in  Treatment: 18 Active Problems ICD-10 Encounter Code Description Active Date MDM Diagnosis [phone] Non-pressure chronic ulcer of other part of left lower leg with fat layer exposed8/04/2022 No Yes L97.812 Non-pressure chronic ulcer of other part of right lower leg with fat layer 07/14/2022 No Yes exposed I87.313 Chronic venous hypertension (idiopathic) with ulcer of bilateral lower extremity 07/14/2022 No Yes E11.622 Type  2 diabetes mellitus with other skin ulcer 05/25/2022 No Yes T79.8XXA Other early complications of trauma, initial encounter 06/23/2022 No Yes S81.801A Unspecified open wound, right lower leg, initial encounter 06/23/2022 No Yes S81.802A Unspecified open wound, left lower leg, initial encounter 06/23/2022 No Yes Inactive Problems Resolved Problems ICD-10 Code Description Active Date Resolved Date W54.8XXA Other contact with dog, initial encounter 05/25/2022 05/25/2022 Electronic Signature(s) Signed: 09/29/2022 12:07:10 PM By: Kalman Shan DO Entered By: Kalman Shan on 09/29/2022 10:44:02 Progress Note Details -------------------------------------------------------------------------------- [name] ([id number]) [id number] Page 5 of 10 Patient Name: Date of Service: [name], [name] [name] 09/29/2022 9:30 A M Medical Record Number: [id number] Patient Account Number: [id number] Date of Birth/Sex: Treating RN: [birth year] (76 y.o. F) Primary Care Provider: [name] Other Clinician: Referring Provider: Treating Provider/Extender: Hollie Beach in Treatment: 18 Subjective Chief Complaint Information obtained from Patient 05/25/2022; left lower extremity wound following a dog scratch 06/23/2022; lacerations to the right and left lower extremity status post fall History of Present Illness (HPI) Admission 05/25/2022 Ms. Liannah Yarbough is a 76 year old female with a past medical history of diet-controlled type 2 diabetes,  chronic 3 stage kidney disease and venous insufficiency that presents to the clinic for a 1 month history of nonhealing ulcer to the left leg. She states that her dog scratched her causing a wound. She has developed cellulitis in this leg and has been treated with clindamycin by her primary care physician. She reports improvement in her symptoms. She currently denies signs of infection. She has been keeping the area covered. She does not wear compression stockings. She is on 80 mg of Lasix twice daily. She has had reflux studies to the left leg on 07/2021 that notes venous reflux throughout the left common femoral vein and greater saphenous vein. She has not had an ablation. She follows with vein and vascular for her venous insufficiency. 8/14; patient presents for follow-up. She states that the wrap stayed on for 4 days and eventually slid down. She has been wearing her compression stocking and doing dressing changes with Hydrofera Blue since. She also states she has a hard time putting on her shoes with the 3 layer compression. She denies signs of infection. 8/21; patient presents for follow-up. She again had trouble with the wrap sliding down. We have been using Hydrofera Blue with gentamicin under 2 layer Coflex. She currently denies signs of infection. 8/28; patient presents for follow-up. She reports taking the wrap off yesterday. We have been using Hydrofera Blue with gentamicin under 2 layer Coflex. She states that the wrap does slide down but remains over the wound. She is currently wearing her compression stocking. She would like to do this instead of the compression wrap. 9/5; patient presents for follow-up. She has been using Medihoney to the left lower extremity remedy wound with her compression stockings. She reports no drainage from the site. Unfortunately she fell over the weekend and had to go to the ED due to lacerations she experienced on her left and right lower extremity. On the  left leg she had 10 sutures placed on the right leg she had 7. She was given Keflex. Today she reports no signs of infection. She has been using Xeroform to the suture sites. 9/11; patient presents for follow-up. She has been using bacitracin ointment to the suture line. She currently denies signs of infection. 9/19; patient presents for follow-up. She has been using Medihoney to the wound beds. She denies signs of infection.  9/26; patient presents for follow-up. She has been using [employer]'s wet-to-dry dressings to the wound beds. She reports improvement in wound healing. She has no issues or complaints today. 10/3; patient presents for follow-up. We had used compression therapy along with Hydrofera Blue to the right lower extremity. She states she felt uncomfortable with the wrap and took it off 3 days ago. She has been using Medihoney without issues to the left lower extremity wound. She denies signs of infection. 10/10; patient presents for follow-up. She has been using Hydrofera Blue and Medihoney to the left lower extremity wound. We have been using Hydrofera Blue and Santyl under compression therapy to the right lower extremity. She states that the wrap slid down 2 days ago and she took it off. She has no issues or complaints today. She denies signs of infection. 10/17; patient presents for follow-up. We have been using Hydrofera Blue to the left lower extremity wounds and Hydrofera Blue and Santyl under compression therapy to the right lower extremity. She has no issues or complaints today. She denies signs of infection. 10/23; patient presents for follow-up. We have been using silver alginate to the left lower extremity and Hydrofera Blue and Santyl under Kerlix/Coban to the right lower extremity. It is unclear which she is using to the left leg. She denies signs of infection. She has no issues or complaints today. 10/30; patient presents for follow-up. We have been using Hydrofera Blue and  Santyl under Kerlix/Coban to the right lower extremity and silver alginate with Medihoney to the left lower extremity under compression stocking. She has no issues or complaints today. 11/13; patient presents for follow-up. We have been using Hydrofera Blue and Santyl under Kerlix/Coban to the right lower extremity. We have been using Medihoney and silver alginate to the left lower extremity wounds. This area is healed. She has no issues or complaints today. 11/20; patient presents for follow-up. We have been using Hydrofera Blue and Santyl under Kerlix/Coban to the right lower extremity. She states that she took the wrap off yesterday Due to the drainage coming through from the wound bed. 11/28; patient presents for follow-up. She took the wrap off over the weekend. She does this often. He has no issues or complaints today. We have been using silver alginate with antibiotic ointment under compression therapy. 12/4; patient presents for follow-up. She took the wrap off over the weekend. We have been using silver alginate with antibiotic ointment under compression therapy. 12/12; patient presents for follow-up. She again took the wrap off over the weekend. She reports minimal drainage. We have been using silver alginate with antibiotic ointment under compression therapy. it is unclear if she is using a dressing when she takes the wrap off. She is using bandages to keep the area covered. Patient History Information obtained from Patient. Family History Cancer - Siblings, Diabetes - Siblings, Heart Disease - Mother, Hypertension - Mother, No family history of Hereditary Spherocytosis, Kidney Disease, Lung Disease, Seizures, Stroke, Thyroid Problems, Tuberculosis. Social History Never smoker, Marital Status - Married, Alcohol Use - Never, Drug Use - No History, Caffeine Use - Moderate. [name], [name] ([id number]) [id number] Page 6 of 10 Medical History Eyes Patient  has history of Cataracts Denies history of Glaucoma Ear/Nose/Mouth/Throat Denies history of Chronic sinus problems/congestion, Middle ear problems Hematologic/Lymphatic Patient has history of Anemia Denies history of Hemophilia, Human Immunodeficiency Virus, Lymphedema, Sickle Cell Disease Respiratory Denies history of Aspiration, Asthma, Chronic Obstructive Pulmonary Disease (COPD), Pneumothorax, Sleep Apnea, Tuberculosis Cardiovascular Patient has  history of Congestive Heart Failure, Hypertension Denies history of Angina, Arrhythmia, Coronary Artery Disease, Deep Vein Thrombosis, Hypotension, Myocardial Infarction, Peripheral Arterial Disease, Peripheral Venous Disease, Phlebitis, Vasculitis Gastrointestinal Denies history of Cirrhosis , Colitis, Crohnoos, Hepatitis A, Hepatitis B, Hepatitis C Endocrine Denies history of Type I Diabetes, Type II Diabetes Genitourinary Denies history of End Stage Renal Disease Immunological Denies history of Lupus Erythematosus, Raynaudoos, Scleroderma Integumentary (Skin) Denies history of History of Burn Musculoskeletal Patient has history of Osteoarthritis Denies history of Gout, Rheumatoid Arthritis, Osteomyelitis Neurologic Denies history of Dementia, Neuropathy, Quadriplegia, Paraplegia, Seizure Disorder Oncologic Denies history of Received Chemotherapy Psychiatric Denies history of Anorexia/bulimia, Confinement Anxiety Hospitalization/Surgery History - right total knee replacement. Medical A Surgical History Notes nd Gastrointestinal diverticulosis hiatal hernia hyperlipidemia Musculoskeletal DJD Objective Constitutional respirations regular, non-labored and within target range for patient.. Vitals Time Taken: 9:36 AM, Height: 63 in, Temperature: 98 F, Pulse: 90 bpm, Respiratory Rate: 20 breaths/min, Blood Pressure: 150/85 mmHg. Cardiovascular 2+ dorsalis pedis/posterior tibialis pulses. Psychiatric pleasant and  cooperative. General Notes: Right lower extremity: Granulation tissue with scant tightly adhered nonviable tissue. 2+ pitting edema to the knee. No signs of infection including increased warmth, erythema or purulent drainage. Integumentary (Hair, Skin) Wound #3 status is Open. Original cause of wound was Trauma. The date acquired was: 06/20/2022. The wound has been in treatment 14 weeks. The wound is located on the Right,Lateral Lower Leg. The wound measures 0.5cm length x 1.4cm width x 0.1cm depth; 0.55cm^2 area and 0.055cm^3 volume. There is Fat Layer (Subcutaneous Tissue) exposed. There is no tunneling or undermining noted. There is a medium amount of serosanguineous drainage noted. The wound margin is distinct with the outline attached to the wound base. There is large (67-100%) red, pink granulation within the wound bed. There is a small (1-33%) amount of necrotic tissue within the wound bed including Adherent Slough. The periwound skin appearance had no abnormalities noted for texture. The periwound skin appearance had no abnormalities noted for moisture. The periwound skin appearance exhibited: Hemosiderin Staining. The periwound skin appearance did not exhibit: Atrophie Blanche, Cyanosis, Ecchymosis, Mottled, Pallor, Rubor, Erythema. Periwound temperature was noted as No Abnormality. Assessment Active Problems ICD-10 Non-pressure chronic ulcer of other part of left lower leg with fat layer exposed Non-pressure chronic ulcer of other part of right lower leg with fat layer exposed [name], Brandy B ([id number]) [id number] Page 7 of 10 Chronic venous hypertension (idiopathic) with ulcer of bilateral lower extremity Type 2 diabetes mellitus with other skin ulcer Other early complications of trauma, initial encounter Unspecified open wound, right lower leg, initial encounter Unspecified open wound, left lower leg, initial encounter Patient's wound has shown  improvement in size and appearance since last clinic visit. I recommended continuing antibiotic ointment and switching the dressing to Hydrofera Blue under compression therapy. We discussed the importance of swelling control for her wound healing. I recommended she try and keep the wrap off if she can. Follow-up in 1 week. Plan Follow-up Appointments: Return Appointment in 1 week. - w/ Dr. Heber [place] Return Appointment in 2 weeks. - Dr. Heber [place] Anesthetic: (In clinic) Topical Lidocaine 5% applied to wound bed Bathing/ Shower/ Hygiene: May shower with protection but do not get wound dressing(s) wet. Edema Control - Lymphedema / SCD / Other: Elevate legs to the level of the heart or above for 30 minutes daily and/or when sitting, a frequency of: - 2-3 times a day throughout the day. Avoid standing for long periods of time. Exercise regularly Moisturize legs daily. -  lotion both legs every night before bed. Compression stocking or Garment 20-30 mm/Hg pressure to: - apply in the morning and remove at night. WOUND #3: - Lower Leg Wound Laterality: Right, Lateral Cleanser: Soap and Water 1 x Per Week/30 Days Discharge Instructions: May shower and wash wound with dial antibacterial soap and water prior to dressing change. Peri-Wound Care: Sween Lotion (Moisturizing lotion) 1 x Per Week/30 Days Discharge Instructions: Apply moisturizing lotion as directed Topical: Gentamicin 1 x Per Week/30 Days Discharge Instructions: As directed by physician Topical: Mupirocin Ointment 1 x Per Week/30 Days Discharge Instructions: Apply Mupirocin (Bactroban) as instructed Prim Dressing: Hydrofera Blue Ready Transfer Foam, 2.5x2.5 (in/in) 1 x Per Week/30 Days ary Discharge Instructions: Apply directly to wound bed as directed Secondary Dressing: Woven Gauze Sponges 2x2 in 1 x Per Week/30 Days Discharge Instructions: Apply over primary dressing as directed. Com pression Wrap: Kerlix Roll 4.5x3.1 (in/yd) 1 x Per  Week/30 Days Discharge Instructions: Apply Kerlix and Coban compression as directed. Com pression Wrap: Coban Self-Adherent Wrap 4x5 (in/yd) 1 x Per Week/30 Days Discharge Instructions: Apply over Kerlix as directed. 1. Hydrofera Blue and antibiotic ointment under Kerlix/Coban 2. Follow-up in 1 week Electronic Signature(s) Signed: 09/29/2022 12:07:10 PM By: Kalman Shan DO Entered By: Kalman Shan on 09/29/2022 10:54:30 -------------------------------------------------------------------------------- HxROS Details Patient Name: Date of Service: [name], Brandy RO [name] 09/29/2022 9:30 A M Medical Record Number: [id number] Patient Account Number: [id number] Date of Birth/Sex: Treating RN: [birth year] (76 y.o. F) Primary Care Provider: [name] Other Clinician: Referring Provider: Treating Provider/Extender: Hollie Beach in Treatment: 18 Information Obtained From Patient Eyes Medical History: Positive for: Cataracts Negative for: Glaucoma [name], [name] ([id number]) [id number] Page 8 of 10 Ear/Nose/Mouth/Throat Medical History: Negative for: Chronic sinus problems/congestion; Middle ear problems Hematologic/Lymphatic Medical History: Positive for: Anemia Negative for: Hemophilia; Human Immunodeficiency Virus; Lymphedema; Sickle Cell Disease Respiratory Medical History: Negative for: Aspiration; Asthma; Chronic Obstructive Pulmonary Disease (COPD); Pneumothorax; Sleep Apnea; Tuberculosis Cardiovascular Medical History: Positive for: Congestive Heart Failure; Hypertension Negative for: Angina; Arrhythmia; Coronary Artery Disease; Deep Vein Thrombosis; Hypotension; Myocardial Infarction; Peripheral Arterial Disease; Peripheral Venous Disease; Phlebitis; Vasculitis Gastrointestinal Medical History: Negative for: Cirrhosis ; Colitis; Crohns; Hepatitis A; Hepatitis B; Hepatitis C Past Medical History  Notes: diverticulosis hiatal hernia hyperlipidemia Endocrine Medical History: Negative for: Type I Diabetes; Type II Diabetes Genitourinary Medical History: Negative for: End Stage Renal Disease Immunological Medical History: Negative for: Lupus Erythematosus; Raynauds; Scleroderma Integumentary (Skin) Medical History: Negative for: History of Burn Musculoskeletal Medical History: Positive for: Osteoarthritis Negative for: Gout; Rheumatoid Arthritis; Osteomyelitis Past Medical History Notes: DJD Neurologic Medical History: Negative for: Dementia; Neuropathy; Quadriplegia; Paraplegia; Seizure Disorder Oncologic Medical History: Negative for: Received Chemotherapy Psychiatric Medical History: Negative for: Anorexia/bulimia; Confinement Anxiety HBO Extended History Items Eyes: [name], [name] ([id number]) [id number] Page 9 of 10 Cataracts Immunizations Pneumococcal Vaccine: Received Pneumococcal Vaccination: Yes Received Pneumococcal Vaccination On or After 60th Birthday: Yes Implantable Devices None Hospitalization / Surgery History Type of Hospitalization/Surgery right total knee replacement Family and Social History Cancer: Yes - Siblings; Diabetes: Yes - Siblings; Heart Disease: Yes - Mother; Hereditary Spherocytosis: No; Hypertension: Yes - Mother; Kidney Disease: No; Lung Disease: No; Seizures: No; Stroke: No; Thyroid Problems: No; Tuberculosis: No; Never smoker; Marital Status - Married; Alcohol Use: Never; Drug Use: No History; Caffeine Use: Moderate; Financial Concerns: No; Food, Clothing or Shelter Needs: No; Support System Lacking: No; Transportation Concerns: No Electronic Signature(s) Signed: 09/29/2022 12:07:10 PM By: Kalman Shan  DO Entered By: Kalman Shan on 09/29/2022 10:48:28 -------------------------------------------------------------------------------- SuperBill Details Patient Name: Date of Service: [name], [name] [name] 09/29/2022 Medical Record Number: [id number] Patient Account Number: [id number] Date of Birth/Sex: Treating RN: 1946-02-25 (76 y.o. [name], [name] Primary Care Provider: [name] Other Clinician: Referring Provider: Treating Provider/Extender: Hollie Beach in Treatment: 18 Diagnosis Coding ICD-10 Codes Code Description [id number] Non-pressure chronic ulcer of other part of left lower leg with fat layer exposed L97.812 Non-pressure chronic ulcer of other part of right lower leg with fat layer exposed I87.313 Chronic venous hypertension (idiopathic) with ulcer of bilateral lower extremity E11.622 Type 2 diabetes mellitus with other skin ulcer T79.8XXA Other early complications of trauma, initial encounter S81.801A Unspecified open wound, right lower leg, initial encounter S81.802A Unspecified open wound, left lower leg, initial encounter Facility Procedures : CPT4 Code: [id number] Description: 99213 - WOUND CARE VISIT-LEV 3 EST PT Modifier: Quantity: 1 Physician Procedures : CPT4 Code Description Modifier [id number] - WC PHYS LEVEL 3 - EST PT ICD-10 Diagnosis Description B09.628 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

## 2022-09-30 NOTE — Progress Notes (Signed)
[name], WAHID ([id number]) [id number] Page 1 of 10 Visit Report for 09/29/2022 Arrival Information Details Patient Name: Date of Service: [name] [name] 09/29/2022 9:30 A M Medical Record Number: [id number] Patient Account Number: [id number] Date of Birth/Sex: Treating RN: [birth year] (76 y.o. [name], [name] Primary Care Provider: [name] Other Clinician: Referring Provider: Treating Provider/Extender: Hollie Beach in Treatment: 18 Visit Information History Since Last Visit Added or deleted any medications: No Patient Arrived: [name] Any new allergies or adverse reactions: No Arrival Time: 09:28 Had a fall or experienced change in No Accompanied By: walker activities of daily living that may affect Transfer Assistance: None risk of falls: Patient Identification Verified: Yes Signs or symptoms of abuse/neglect since last visito No Secondary Verification Process Completed: Yes Hospitalized since last visit: No Patient Requires Transmission-Based Precautions: No Implantable device outside of the clinic excluding No Patient Has Alerts: No cellular tissue based products placed in the center since last visit: Has Dressing in Place as Prescribed: Yes Has Compression in Place as Prescribed: No Pain Present Now: No Notes removed wrap saturday. Wearing compression stocking. Electronic Signature(s) Signed: 09/29/2022 4:43:40 PM By: [name] RN, BSN Entered By: [name] on 09/29/2022 09:31:30 -------------------------------------------------------------------------------- Clinic Level of Care Assessment Details Patient Name: Date of Service: [name] [name] 09/29/2022 9:30 A M Medical Record Number: [id number] Patient Account Number: [id number] Date of Birth/Sex: Treating RN: [birth year] (76 y.o. [name] Primary Care Provider: [name] Other Clinician: Referring Provider: Treating  Provider/Extender: Hollie Beach in Treatment: 18 Clinic Level of Care Assessment Items TOOL 4 Quantity Score X- 1 0 Use when only an EandM is performed on FOLLOW-UP visit ASSESSMENTS - Nursing Assessment / Reassessment X- 1 10 Reassessment of Co-morbidities (includes updates in patient status) X- 1 5 Reassessment of Adherence to Treatment Plan ASSESSMENTS - Wound and Skin A ssessment / Reassessment X - Simple Wound Assessment / Reassessment - one wound 1 5 [] - 0 Complex Wound Assessment / Reassessment - multiple wounds X- 1 10 Dermatologic / Skin Assessment (not related to wound area) ASSESSMENTS - Focused Assessment ROOPA, GRAVER B ([id number]) [id number] Page 2 of 10 X- 1 5 Circumferential Edema Measurements - multi extremities [] - 0 Nutritional Assessment / Counseling / Intervention [] - 0 Lower Extremity Assessment (monofilament, tuning fork, pulses) [] - 0 Peripheral Arterial Disease Assessment (using hand held doppler) ASSESSMENTS - Ostomy and/or Continence Assessment and Care [] - 0 Incontinence Assessment and Management [] - 0 Ostomy Care Assessment and Management (repouching, etc.) PROCESS - Coordination of Care X - Simple Patient / Family Education for ongoing care 1 15 [] - 0 Complex (extensive) Patient / Family Education for ongoing care X- 1 10 Staff obtains Programmer, systems, Records, T Results / Process Orders est [] - 0 Staff telephones HHA, Nursing Homes / Clarify orders / etc [] - 0 Routine Transfer to another Facility (non-emergent condition) [] - 0 Routine Hospital Admission (non-emergent condition) [] - 0 New Admissions / Biomedical engineer / Ordering NPWT Apligraf, etc. , [] - 0 Emergency Hospital Admission (emergent condition) X- 1 10 Simple Discharge Coordination [] - 0 Complex (extensive) Discharge Coordination PROCESS - Special Needs [] - 0 Pediatric / Minor Patient Management [] -  0 Isolation Patient Management [] - 0 Hearing / Language / Visual special needs [] - 0 Assessment of Community assistance (transportation, D/C planning, etc.) [] - 0 Additional assistance / Altered mentation [] -  0 Support Surface(s) Assessment (bed, cushion, seat, etc.) INTERVENTIONS - Wound Cleansing / Measurement X - Simple Wound Cleansing - one wound 1 5 [] - 0 Complex Wound Cleansing - multiple wounds X- 1 5 Wound Imaging (photographs - any number of wounds) [] - 0 Wound Tracing (instead of photographs) X- 1 5 Simple Wound Measurement - one wound [] - 0 Complex Wound Measurement - multiple wounds INTERVENTIONS - Wound Dressings [] - 0 Small Wound Dressing one or multiple wounds [] - 0 Medium Wound Dressing one or multiple wounds X- 1 20 Large Wound Dressing one or multiple wounds [] - 0 Application of Medications - topical [] - 0 Application of Medications - injection INTERVENTIONS - Miscellaneous [] - 0 External ear exam [] - 0 Specimen Collection (cultures, biopsies, blood, body fluids, etc.) [] - 0 Specimen(s) / Culture(s) sent or taken to Lab for analysis [] - 0 Patient Transfer (multiple staff / Harrel Lemon Lift / Similar devices) [] - 0 Simple Staple / Suture removal (25 or less) FANTASHIA, SHUPERT B ([id number]) [id number] Page 3 of 10 [] - 0 Complex Staple / Suture removal (26 or more) [] - 0 Hypo / Hyperglycemic Management (close monitor of Blood Glucose) [] - 0 Ankle / Brachial Index (ABI) - do not check if billed separately X- 1 5 Vital Signs Has the patient been seen at the hospital within the last three years: Yes Total Score: 110 Level Of Care: New/Established - Level 3 Electronic Signature(s) Signed: 09/29/2022 4:43:40 PM By: [name] RN, BSN Entered By: [name] on 09/29/2022 09:56:37 -------------------------------------------------------------------------------- Encounter Discharge Information  Details Patient Name: Date of Service: [name], [place] Brandy [name] 09/29/2022 9:30 A M Medical Record Number: [id number] Patient Account Number: [id number] Date of Birth/Sex: Treating RN: 24-Dec-1945 (76 y.o. [name], [name] Primary Care Provider: [name] Other Clinician: Referring Provider: Treating Provider/Extender: Hollie Beach in Treatment: 18 Encounter Discharge Information Items Discharge Condition: Stable Ambulatory Status: Walker Discharge Destination: Home Transportation: Private Auto Accompanied By: son Schedule Follow-up Appointment: Yes Clinical Summary of Care: Electronic Signature(s) Signed: 09/29/2022 4:43:40 PM By: [name] RN, BSN Entered By: [name] on 09/29/2022 09:57:04 -------------------------------------------------------------------------------- Lower Extremity Assessment Details Patient Name: Date of Service: [name] Brandy [name] 09/29/2022 9:30 A M Medical Record Number: [id number] Patient Account Number: [id number] Date of Birth/Sex: Treating RN: 1946-09-17 (76 y.o. [name], [name] Primary Care Provider: [name] Other Clinician: Referring Provider: Treating Provider/Extender: Hollie Beach in Treatment: 18 Edema Assessment Assessed: [name]: No] Patrice Paradise: Yes] Edema: [Left: Ye] [Right: s] Calf Left: Right: Point of Measurement: 41 cm From Medial Instep 41 cm Ankle Left: Right: Point of Measurement: 8 cm From Medial Instep 25 cm WYNDI, NORTHRUP B ([id number]) [id number] Page 4 of 10 Vascular Assessment Pulses: Dorsalis Pedis Palpable: [Right:Yes] Electronic Signature(s) Signed: 09/29/2022 4:43:40 PM By: [name] RN, BSN Entered By: [name] on 09/29/2022 09:32:27 -------------------------------------------------------------------------------- Multi Wound Chart Details Patient Name: Date of Service: [name], [place] Brandy [name] 09/29/2022 9:30 A M Medical  Record Number: [id number] Patient Account Number: [id number] Date of Birth/Sex: Treating RN: 1946/10/08 (76 y.o. F) Primary Care Provider: [name] Other Clinician: Referring Provider: Treating Provider/Extender: Hollie Beach in Treatment: 18 Vital Signs Height(in): 63 Pulse(bpm): 90 Weight(lbs): Blood Pressure(mmHg): 150/85 Body Mass Index(BMI): Temperature(F): 98 Respiratory Rate(breaths/min): 20 [3:Photos:] [N/A:N/A] Right, Lateral Lower Leg N/A N/A Wound Location: Trauma N/A N/A Wounding Event: Trauma, Other N/A N/A Primary  Etiology: Cataracts, Anemia, Congestive Heart N/A N/A Comorbid History: Failure, Hypertension, Osteoarthritis 06/20/2022 N/A N/A Date Acquired: 14 N/A N/A Weeks of Treatment: Open N/A N/A Wound Status: No N/A N/A Wound Recurrence: 0.5x1.4x0.1 N/A N/A Measurements L x W x D (cm) 0.55 N/A N/A A (cm) : rea 0.055 N/A N/A Volume (cm) : 98.60% N/A N/A % Reduction in Area: 99.30% N/A N/A % Reduction in Volume: Full Thickness With Exposed Support N/A N/A Classification: Structures Medium N/A N/A Exudate Amount: Serosanguineous N/A N/A Exudate Type: red, brown N/A N/A Exudate Color: Distinct, outline attached N/A N/A Wound Margin: Large (67-100%) N/A N/A Granulation Amount: Red, Pink N/A N/A Granulation Quality: Small (1-33%) N/A N/A Necrotic Amount: Fat Layer (Subcutaneous Tissue): Yes N/A N/A Exposed Structures: Fascia: No Tendon: No Muscle: No Joint: No Bone: No Large (67-100%) N/A N/A Epithelialization: Excoriation: No N/A N/A Periwound Skin TextureSCHAE, CANDO B ([id number]) [id number] Page 5 of 10 Induration: No Callus: No Crepitus: No Rash: No Scarring: No Maceration: No N/A N/A Periwound Skin Moisture: Dry/Scaly: No Hemosiderin Staining: Yes N/A N/A Periwound Skin Color: Atrophie Blanche: No Cyanosis: No Ecchymosis: No Erythema: No Mottled: No Pallor:  No Rubor: No No Abnormality N/A N/A Temperature: Treatment Notes Wound #3 (Lower Leg) Wound Laterality: Right, Lateral Cleanser Soap and Water Discharge Instruction: May shower and wash wound with dial antibacterial soap and water prior to dressing change. Peri-Wound Care Sween Lotion (Moisturizing lotion) Discharge Instruction: Apply moisturizing lotion as directed Topical Gentamicin Discharge Instruction: As directed by physician Mupirocin Ointment Discharge Instruction: Apply Mupirocin (Bactroban) as instructed Primary Dressing Hydrofera Blue Ready Transfer Foam, 2.5x2.5 (in/in) Discharge Instruction: Apply directly to wound bed as directed Secondary Dressing Woven Gauze Sponges 2x2 in Discharge Instruction: Apply over primary dressing as directed. Secured With Compression Wrap Kerlix Roll 4.5x3.1 (in/yd) Discharge Instruction: Apply Kerlix and Coban compression as directed. Coban Self-Adherent Wrap 4x5 (in/yd) Discharge Instruction: Apply over Kerlix as directed. Compression Stockings Add-Ons Electronic Signature(s) Signed: 09/29/2022 12:07:10 PM By: Kalman Shan DO Entered By: Kalman Shan on 09/29/2022 10:44:08 -------------------------------------------------------------------------------- Multi-Disciplinary Care Plan Details Patient Name: Date of Service: [name], [place] Brandy [name] 09/29/2022 9:30 A M Medical Record Number: [id number] Patient Account Number: [id number] Date of Birth/Sex: Treating RN: Mar 17, 1946 (76 y.o. [name], [name] Primary Care Provider: [name] Other Clinician: Referring Provider: Treating Provider/Extender: Hollie Beach in Treatment: [address] [place] B ([id number]) [id number] Page 6 of 10 Active Inactive Pain, Acute or Chronic Nursing Diagnoses: Pain, acute or chronic: actual or potential Potential alteration in comfort, pain Goals: Patient will verbalize adequate pain control  and receive pain control interventions during procedures as needed Date Initiated: 05/25/2022 Date Inactivated: 09/29/2022 Target Resolution Date: 09/29/2022 Goal Status: Met Patient/caregiver will verbalize comfort level met Date Initiated: 05/25/2022 Target Resolution Date: 10/09/2022 Goal Status: Active Interventions: Complete pain assessment as per visit requirements Encourage patient to take pain medications as prescribed Provide education on pain management Treatment Activities: Administer pain control measures as ordered : 05/25/2022 Notes: Venous Leg Ulcer Nursing Diagnoses: Knowledge deficit related to disease process and management Goals: Non-invasive venous studies are completed as ordered Date Initiated: 05/25/2022 Target Resolution Date: 10/16/2022 Goal Status: Active Interventions: Assess peripheral edema status every visit. Provide education on venous insufficiency Treatment Activities: Non-invasive vascular studies : 05/25/2022 Notes: Wound/Skin Impairment Nursing Diagnoses: Knowledge deficit related to ulceration/compromised skin integrity Goals: Patient/caregiver will verbalize understanding of skin care regimen Date Initiated: 05/25/2022 Date Inactivated: 09/29/2022 Target Resolution Date: 09/29/2022 Goal Status: Met  Ulcer/skin breakdown will heal within 14 weeks Date Initiated: 05/25/2022 Target Resolution Date: 10/16/2022 Goal Status: Active Interventions: Assess patient/caregiver ability to obtain necessary supplies Assess patient/caregiver ability to perform ulcer/skin care regimen upon admission and as needed Provide education on ulcer and skin care Treatment Activities: Skin care regimen initiated : 05/25/2022 Topical wound management initiated : 05/25/2022 Notes: Electronic Signature(s) Signed: 09/29/2022 4:43:40 PM By: [name] RN, BSN Entered By: [name] on 09/29/2022 09:55:53 Thomasene Mohair ([id number])  [id number] Page 7 of 10 -------------------------------------------------------------------------------- Pain Assessment Details Patient Name: Date of Service: [name] [name] 09/29/2022 9:30 A M Medical Record Number: [id number] Patient Account Number: [id number] Date of Birth/Sex: Treating RN: Jul 09, 1946 (77 y.o. [name] Primary Care Provider: [name] Other Clinician: Referring Provider: Treating Provider/Extender: Hollie Beach in Treatment: 18 Active Problems Location of Pain Severity and Description of Pain Patient Has Paino No Site Locations Pain Management and Medication Current Pain Management: Electronic Signature(s) Signed: 09/29/2022 4:43:40 PM By: [name] RN, BSN Entered By: [name] on 09/29/2022 09:32:17 -------------------------------------------------------------------------------- Patient/Caregiver Education Details Patient Name: Date of Service: [name] Brandy [name] 12/12/2023andnbsp9:30 A M Medical Record Number: [id number] Patient Account Number: [id number] Date of Birth/Gender: Treating RN: 31-Mar-1946 (76 y.o. [name] Primary Care Physician: [name] Other Clinician: Referring Physician: Treating Physician/Extender: Hollie Beach in Treatment: 18 Education Assessment Education Provided To: Patient Education Topics Provided Wound/Skin Impairment: Handouts: Skin Care Do's and Dont's Methods: Explain/Verbal Responses: Reinforcements needed MURRY, KHIEV ([id number]) [id number] Page 8 of 10 Electronic Signature(s) Signed: 09/29/2022 4:43:40 PM By: [name] RN, BSN Entered By: [name] on 09/29/2022 09:56:05 -------------------------------------------------------------------------------- Wound Assessment Details Patient Name: Date of Service: [name] Brandy [name] 09/29/2022 9:30 A M Medical Record Number:  [id number] Patient Account Number: [id number] Date of Birth/Sex: Treating RN: 16-Dec-1945 (76 y.o. [name], [name] Primary Care Provider: [name] Other Clinician: Referring Provider: Treating Provider/Extender: Hollie Beach in Treatment: 18 Wound Status Wound Number: 3 Primary Trauma, Other Etiology: Wound Location: Right, Lateral Lower Leg Wound Status: Open Wounding Event: Trauma Comorbid Cataracts, Anemia, Congestive Heart Failure, Hypertension, Date Acquired: 06/20/2022 History: Osteoarthritis Weeks Of Treatment: 14 Clustered Wound: No Photos Wound Measurements Length: (cm) 0.5 Width: (cm) 1.4 Depth: (cm) 0.1 Area: (cm) 0.55 Volume: (cm) 0.055 % Reduction in Area: 98.6% % Reduction in Volume: 99.3% Epithelialization: Large (67-100%) Tunneling: No Undermining: No Wound Description Classification: Full Thickness With Exposed Suppor Wound Margin: Distinct, outline attached Exudate Amount: Medium Exudate Type: Serosanguineous Exudate Color: red, brown t Structures Foul Odor After Cleansing: No Slough/Fibrino Yes Wound Bed Granulation Amount: Large (67-100%) Exposed Structure Granulation Quality: Red, Pink Fascia Exposed: No Necrotic Amount: Small (1-33%) Fat Layer (Subcutaneous Tissue) Exposed: Yes Necrotic Quality: Adherent Slough Tendon Exposed: No Muscle Exposed: No Joint Exposed: No Bone Exposed: No Periwound Skin Texture Texture Color No Abnormalities Noted: Yes No Abnormalities Noted: No Atrophie Blanche: No Moisture Cyanosis: No No Abnormalities NotedSHAWNDREA, RUTKOWSKI B ([id number]) [id number] Page 9 of 10 Ecchymosis: No Erythema: No Hemosiderin Staining: Yes Mottled: No Pallor: No Rubor: No Temperature / Pain Temperature: No Abnormality Treatment Notes Wound #3 (Lower Leg) Wound Laterality: Right, Lateral Cleanser Soap and Water Discharge Instruction: May shower and wash wound with  dial antibacterial soap and water prior to dressing change. Peri-Wound Care Sween Lotion (Moisturizing lotion) Discharge Instruction: Apply moisturizing lotion as directed Topical Gentamicin Discharge Instruction: As directed by  physician Mupirocin Ointment Discharge Instruction: Apply Mupirocin (Bactroban) as instructed Primary Dressing Hydrofera Blue Ready Transfer Foam, 2.5x2.5 (in/in) Discharge Instruction: Apply directly to wound bed as directed Secondary Dressing Woven Gauze Sponges 2x2 in Discharge Instruction: Apply over primary dressing as directed. Secured With Compression Wrap Kerlix Roll 4.5x3.1 (in/yd) Discharge Instruction: Apply Kerlix and Coban compression as directed. Coban Self-Adherent Wrap 4x5 (in/yd) Discharge Instruction: Apply over Kerlix as directed. Compression Stockings Add-Ons Electronic Signature(s) Signed: 09/29/2022 4:43:40 PM By: [name] RN, BSN Entered By: [name] on 09/29/2022 09:34:44 -------------------------------------------------------------------------------- Vitals Details Patient Name: Date of Service: [name] Brandy [name] 09/29/2022 9:30 A M Medical Record Number: [id number] Patient Account Number: [id number] Date of Birth/Sex: Treating RN: 1946-08-20 (76 y.o. [name] Primary Care Eurika Sandy: [name] Other Clinician: Referring Jordann Grime: Treating Caswell Alvillar/Extender: Hollie Beach in Treatment: 18 Vital Signs Time Taken: 09:36 Temperature (F): 98 Height (in): 63 Pulse (bpm): 90 Respiratory Rate (breaths/min): 20 Blood Pressure (mmHg): 150/85 JAMEILA, KEENY B ([id number]) [id number] Page 10 of 10 Reference Range: 80 - 120 mg / dl Electronic Signature(s) Signed: 09/29/2022 4:43:40 PM By: [name] RN, BSN Entered By: [name] on 09/29/2022 09:36:11

## 2022-10-05 ENCOUNTER — Encounter (HOSPITAL_BASED_OUTPATIENT_CLINIC_OR_DEPARTMENT_OTHER): Payer: Medicare HMO | Admitting: Internal Medicine

## 2022-10-05 DIAGNOSIS — E11622 Type 2 diabetes mellitus with other skin ulcer: Secondary | ICD-10-CM | POA: Diagnosis not present

## 2022-10-05 DIAGNOSIS — L97812 Non-pressure chronic ulcer of other part of right lower leg with fat layer exposed: Secondary | ICD-10-CM | POA: Diagnosis not present

## 2022-10-05 DIAGNOSIS — I87311 Chronic venous hypertension (idiopathic) with ulcer of right lower extremity: Secondary | ICD-10-CM | POA: Diagnosis not present

## 2022-10-05 DIAGNOSIS — T798XXA Other early complications of trauma, initial encounter: Secondary | ICD-10-CM | POA: Diagnosis not present

## 2022-10-05 DIAGNOSIS — N183 Chronic kidney disease, stage 3 unspecified: Secondary | ICD-10-CM | POA: Diagnosis not present

## 2022-10-05 DIAGNOSIS — E1122 Type 2 diabetes mellitus with diabetic chronic kidney disease: Secondary | ICD-10-CM | POA: Diagnosis not present

## 2022-10-05 DIAGNOSIS — I87313 Chronic venous hypertension (idiopathic) with ulcer of bilateral lower extremity: Secondary | ICD-10-CM | POA: Diagnosis not present

## 2022-10-05 NOTE — Progress Notes (Signed)
[name], [name] ([id number]) [id number] Page 1 of 9 Visit Report for 10/05/2022 Chief Complaint Document Details Patient Name: Date of Service: [name] [name] 10/05/2022 9:15 A M Medical Record Number: [id number] Patient Account Number: [id number] Date of Birth/Sex: Treating RN: 1946/05/23 (76 y.o. F) Primary Care Provider: [name] Other Clinician: Referring Provider: Treating Provider/Extender: Hollie Beach in Treatment: 19 Information Obtained from: Patient Chief Complaint 05/25/2022; left lower extremity wound following a dog scratch 06/23/2022; lacerations to the right and left lower extremity status post fall Electronic Signature(s) Signed: 10/05/2022 11:20:58 AM By: Kalman Shan DO Entered By: Kalman Shan on 10/05/2022 09:38:28 -------------------------------------------------------------------------------- HPI Details Patient Name: Date of Service: [name], [place] RO [name] 10/05/2022 9:15 A M Medical Record Number: [id number] Patient Account Number: [id number] Date of Birth/Sex: Treating RN: [birth year] (76 y.o. F) Primary Care Provider: [name] Other Clinician: Referring Provider: Treating Provider/Extender: Hollie Beach in Treatment: 19 History of Present Illness HPI Description: Admission 05/25/2022 [name] is a 76 year old female with a past medical history of diet-controlled type 2 diabetes, chronic 3 stage kidney disease and venous insufficiency that presents to the clinic for a 1 month history of nonhealing ulcer to the left leg. She states that her dog scratched her causing a wound. She has developed cellulitis in this leg and has been treated with clindamycin by her primary care physician. She reports improvement in her symptoms. She currently denies signs of infection. She has been keeping the area covered. She does not wear compression stockings. She is on 80 mg of  Lasix twice daily. She has had reflux studies to the left leg on 07/2021 that notes venous reflux throughout the left common femoral vein and greater saphenous vein. She has not had an ablation. She follows with vein and vascular for her venous insufficiency. 8/14; patient presents for follow-up. She states that the wrap stayed on for 4 days and eventually slid down. She has been wearing her compression stocking and doing dressing changes with Hydrofera Blue since. She also states she has a hard time putting on her shoes with the 3 layer compression. She denies signs of infection. 8/21; patient presents for follow-up. She again had trouble with the wrap sliding down. We have been using Hydrofera Blue with gentamicin under 2 layer Coflex. She currently denies signs of infection. 8/28; patient presents for follow-up. She reports taking the wrap off yesterday. We have been using Hydrofera Blue with gentamicin under 2 layer Coflex. She states that the wrap does slide down but remains over the wound. She is currently wearing her compression stocking. She would like to do this instead of the compression wrap. 9/5; patient presents for follow-up. She has been using Medihoney to the left lower extremity remedy wound with her compression stockings. She reports no drainage from the site. Unfortunately she fell over the weekend and had to go to the ED due to lacerations she experienced on her left and right lower extremity. On the left leg she had 10 sutures placed on the right leg she had 7. She was given Keflex. Today she reports no signs of infection. She has been using Xeroform to the suture sites. 9/11; patient presents for follow-up. She has been using bacitracin ointment to the suture line. She currently denies signs of infection. 9/19; patient presents for follow-up. She has been using Medihoney to the wound beds. She denies signs of infection. 9/26; patient presents for follow-up. She has been  using  [employer]akin's wet-to-dry dressings to the wound beds. She reports improvement in wound healing. She has no issues or complaints today. Brandy, [name] ([id number]) [id number] Page 2 of 9 10/3; patient presents for follow-up. We had used compression therapy along with Hydrofera Blue to the right lower extremity. She states she felt uncomfortable with the wrap and took it off 3 days ago. She has been using Medihoney without issues to the left lower extremity wound. She denies signs of infection. 10/10; patient presents for follow-up. She has been using Hydrofera Blue and Medihoney to the left lower extremity wound. We have been using Hydrofera Blue and Santyl under compression therapy to the right lower extremity. She states that the wrap slid down 2 days ago and she took it off. She has no issues or complaints today. She denies signs of infection. 10/17; patient presents for follow-up. We have been using Hydrofera Blue to the left lower extremity wounds and Hydrofera Blue and Santyl under compression therapy to the right lower extremity. She has no issues or complaints today. She denies signs of infection. 10/23; patient presents for follow-up. We have been using silver alginate to the left lower extremity and Hydrofera Blue and Santyl under Kerlix/Coban to the right lower extremity. It is unclear which she is using to the left leg. She denies signs of infection. She has no issues or complaints today. 10/30; patient presents for follow-up. We have been using Hydrofera Blue and Santyl under Kerlix/Coban to the right lower extremity and silver alginate with Medihoney to the left lower extremity under compression stocking. She has no issues or complaints today. 11/13; patient presents for follow-up. We have been using Hydrofera Blue and Santyl under Kerlix/Coban to the right lower extremity. We have been using Medihoney and silver alginate to the left lower extremity wounds. This  area is healed. She has no issues or complaints today. 11/20; patient presents for follow-up. We have been using Hydrofera Blue and Santyl under Kerlix/Coban to the right lower extremity. She states that she took the wrap off yesterday Due to the drainage coming through from the wound bed. 11/28; patient presents for follow-up. She took the wrap off over the weekend. She does this often. He has no issues or complaints today. We have been using silver alginate with antibiotic ointment under compression therapy. 12/4; patient presents for follow-up. She took the wrap off over the weekend. We have been using silver alginate with antibiotic ointment under compression therapy. 12/12; patient presents for follow-up. She again took the wrap off over the weekend. She reports minimal drainage. We have been using silver alginate with antibiotic ointment under compression therapy. it is unclear if she is using a dressing when she takes the wrap off. She is using bandages to keep the area covered. 12/18; patient presents for follow-up. She again took the wrap off over the weekend. Despite this there is still is improvement in wound healing. She has no issues or complaints today. Electronic Signature(s) Signed: 10/05/2022 11:20:58 AM By: Kalman Shan DO Entered By: Kalman Shan on 10/05/2022 09:39:06 -------------------------------------------------------------------------------- Physical Exam Details Patient Name: Date of Service: [name] RO [name] 10/05/2022 9:15 A M Medical Record Number: [id number] Patient Account Number: [id number] Date of Birth/Sex: Treating RN: 04/29/1946 (76 y.o. F) Primary Care Provider: [name] Other Clinician: Referring Provider: Treating Provider/Extender: Hollie Beach in Treatment: 19 Constitutional respirations regular, non-labored and within target range for patient.. Cardiovascular 2+ dorsalis pedis/posterior tibialis  pulses. Psychiatric pleasant  and cooperative. Notes Right lower extremity: Granulation tissue with scant tightly adhered nonviable tissue. 2+ pitting edema to the knee. No signs of infection including increased warmth, erythema or purulent drainage. Electronic Signature(s) Signed: 10/05/2022 11:20:58 AM By: Kalman Shan DO Entered By: Kalman Shan on 10/05/2022 09:39:42 Thomasene Mohair ([id number]) [id number] Page 3 of 9 -------------------------------------------------------------------------------- Physician Orders Details Patient Name: Date of Service: [name] [name] 10/05/2022 9:15 A M Medical Record Number: [id number] Patient Account Number: [id number] Date of Birth/Sex: Treating RN: [birth year] (76 y.o. [name], [name] Primary Care Provider: [name] Other Clinician: Referring Provider: Treating Provider/Extender: Hollie Beach in Treatment: 64 Verbal / Phone Orders: No Diagnosis Coding Follow-up Appointments ppointment in 1 week. - w/ Dr. Heber [place] Return A ppointment in 2 weeks. - Dr. Heber [place] Return A Anesthetic (In clinic) Topical Lidocaine 5% applied to wound bed Bathing/ Shower/ Hygiene May shower with protection but do not get wound dressing(s) wet. Edema Control - Lymphedema / SCD / Other Elevate legs to the level of the heart or above for 30 minutes daily and/or when sitting, a frequency of: - 2-3 times a day throughout the day. Avoid standing for long periods of time. Exercise regularly Moisturize legs daily. - lotion both legs every night before bed. Compression stocking or Garment 20-30 mm/Hg pressure to: - apply in the morning and remove at night. Wound Treatment Wound #3 - Lower Leg Wound Laterality: Right, Lateral Cleanser: Soap and Water 1 x Per Week/30 Days Discharge Instructions: May shower and wash wound with dial antibacterial soap and water prior to dressing change. Peri-Wound  Care: Sween Lotion (Moisturizing lotion) 1 x Per Week/30 Days Discharge Instructions: Apply moisturizing lotion as directed Topical: Gentamicin 1 x Per Week/30 Days Discharge Instructions: As directed by physician Topical: Mupirocin Ointment 1 x Per Week/30 Days Discharge Instructions: Apply Mupirocin (Bactroban) as instructed Prim Dressing: Hydrofera Blue Ready Transfer Foam, 2.5x2.5 (in/in) 1 x Per Week/30 Days ary Discharge Instructions: Apply directly to wound bed as directed Secondary Dressing: Woven Gauze Sponges 2x2 in 1 x Per Week/30 Days Discharge Instructions: Apply over primary dressing as directed. Compression Wrap: Kerlix Roll 4.5x3.1 (in/yd) 1 x Per Week/30 Days Discharge Instructions: Apply Kerlix and Coban compression as directed. Compression Wrap: Coban Self-Adherent Wrap 4x5 (in/yd) 1 x Per Week/30 Days Discharge Instructions: Apply over Kerlix as directed. Electronic Signature(s) Signed: 10/05/2022 11:20:58 AM By: Kalman Shan DO Entered By: Kalman Shan on 10/05/2022 09:39:53 Problem List Details -------------------------------------------------------------------------------- Thomasene Mohair ([id number]) [id number] Page 4 of 9 Patient Name: Date of Service: [name], [name] [name] 10/05/2022 9:15 A M Medical Record Number: [id number] Patient Account Number: [id number] Date of Birth/Sex: Treating RN: Aug 16, 1946 (76 y.o. 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/04/2022 No Yes L97.812 Non-pressure chronic ulcer of other part of right lower leg with fat layer 07/14/2022 No Yes exposed I87.313 Chronic venous hypertension (idiopathic) with ulcer of  bilateral lower extremity 07/14/2022 No Yes E11.622 Type 2 diabetes mellitus with other skin ulcer 05/25/2022 No Yes T79.8XXA Other early complications of trauma, initial encounter 06/23/2022 No Yes S81.801A Unspecified open wound, right lower leg, initial encounter 06/23/2022 No Yes S81.802A Unspecified open wound, left lower leg, initial encounter 06/23/2022 No Yes Inactive Problems Resolved Problems ICD-10 Code  Description Active Date Resolved Date W57.8XXA Other contact with dog, initial encounter 05/25/2022 05/25/2022 Electronic Signature(s) Signed: 10/05/2022 11:20:58 AM By: Kalman Shan DO Entered By: Kalman Shan on 10/05/2022 09:38:06 -------------------------------------------------------------------------------- Progress Note Details Patient Name: Date of Service: [name], [place] RO [name] 10/05/2022 9:15 A M Medical Record Number: [id number] Patient Account Number: [id number] Date of Birth/Sex: Treating RN: 1946/08/21 (76 y.o. F) Primary Care Provider: [name] Other Clinician: Referring Provider: Treating Provider/Extender: Hollie Beach in Treatment: [address], Lanora Manis ([id number]) [id number] Page 5 of 9 Chief Complaint Information obtained from Patient 05/25/2022; left lower extremity wound following a dog scratch 06/23/2022; lacerations to the right and left lower extremity status post fall History of Present Illness (HPI) Admission 05/25/2022 Ms. Mairead Schwarzkopf is a 76 year old female with a past medical history of diet-controlled type 2 diabetes, chronic 3 stage kidney disease and venous insufficiency that presents to the clinic for a 1 month history of nonhealing ulcer to the left leg. She states that her dog scratched her causing a wound. She has developed cellulitis in this leg and has been treated with clindamycin by her primary care physician. She reports improvement in her symptoms. She currently denies signs  of infection. She has been keeping the area covered. She does not wear compression stockings. She is on 80 mg of Lasix twice daily. She has had reflux studies to the left leg on 07/2021 that notes venous reflux throughout the left common femoral vein and greater saphenous vein. She has not had an ablation. She follows with vein and vascular for her venous insufficiency. 8/14; patient presents for follow-up. She states that the wrap stayed on for 4 days and eventually slid down. She has been wearing her compression stocking and doing dressing changes with Hydrofera Blue since. She also states she has a hard time putting on her shoes with the 3 layer compression. She denies signs of infection. 8/21; patient presents for follow-up. She again had trouble with the wrap sliding down. We have been using Hydrofera Blue with gentamicin under 2 layer Coflex. She currently denies signs of infection. 8/28; patient presents for follow-up. She reports taking the wrap off yesterday. We have been using Hydrofera Blue with gentamicin under 2 layer Coflex. She states that the wrap does slide down but remains over the wound. She is currently wearing her compression stocking. She would like to do this instead of the compression wrap. 9/5; patient presents for follow-up. She has been using Medihoney to the left lower extremity remedy wound with her compression stockings. She reports no drainage from the site. Unfortunately she fell over the weekend and had to go to the ED due to lacerations she experienced on her left and right lower extremity. On the left leg she had 10 sutures placed on the right leg she had 7. She was given Keflex. Today she reports no signs of infection. She has been using Xeroform to the suture sites. 9/11; patient presents for follow-up. She has been using bacitracin ointment to the suture line. She currently denies signs of infection. 9/19; patient presents for follow-up. She has been using  Medihoney to the wound beds. She denies signs of infection. 9/26; patient presents for follow-up. She has been using [employer]'s wet-to-dry dressings to the wound beds. She reports improvement in wound healing. She has no issues or complaints today. 10/3; patient presents for follow-up. We had used compression therapy along with Hydrofera Blue to the right lower extremity. She states she  felt uncomfortable with the wrap and took it off 3 days ago. She has been using Medihoney without issues to the left lower extremity wound. She denies signs of infection. 10/10; patient presents for follow-up. She has been using Hydrofera Blue and Medihoney to the left lower extremity wound. We have been using Hydrofera Blue and Santyl under compression therapy to the right lower extremity. She states that the wrap slid down 2 days ago and she took it off. She has no issues or complaints today. She denies signs of infection. 10/17; patient presents for follow-up. We have been using Hydrofera Blue to the left lower extremity wounds and Hydrofera Blue and Santyl under compression therapy to the right lower extremity. She has no issues or complaints today. She denies signs of infection. 10/23; patient presents for follow-up. We have been using silver alginate to the left lower extremity and Hydrofera Blue and Santyl under Kerlix/Coban to the right lower extremity. It is unclear which she is using to the left leg. She denies signs of infection. She has no issues or complaints today. 10/30; patient presents for follow-up. We have been using Hydrofera Blue and Santyl under Kerlix/Coban to the right lower extremity and silver alginate with Medihoney to the left lower extremity under compression stocking. She has no issues or complaints today. 11/13; patient presents for follow-up. We have been using Hydrofera Blue and Santyl under Kerlix/Coban to the right lower extremity. We have been using Medihoney and silver alginate to the  left lower extremity wounds. This area is healed. She has no issues or complaints today. 11/20; patient presents for follow-up. We have been using Hydrofera Blue and Santyl under Kerlix/Coban to the right lower extremity. She states that she took the wrap off yesterday Due to the drainage coming through from the wound bed. 11/28; patient presents for follow-up. She took the wrap off over the weekend. She does this often. He has no issues or complaints today. We have been using silver alginate with antibiotic ointment under compression therapy. 12/4; patient presents for follow-up. She took the wrap off over the weekend. We have been using silver alginate with antibiotic ointment under compression therapy. 12/12; patient presents for follow-up. She again took the wrap off over the weekend. She reports minimal drainage. We have been using silver alginate with antibiotic ointment under compression therapy. it is unclear if she is using a dressing when she takes the wrap off. She is using bandages to keep the area covered. 12/18; patient presents for follow-up. She again took the wrap off over the weekend. Despite this there is still is improvement in wound healing. She has no issues or complaints today. Patient History Information obtained from Patient. Family History Cancer - Siblings, Diabetes - Siblings, Heart Disease - Mother, Hypertension - Mother, No family history of Hereditary Spherocytosis, Kidney Disease, Lung Disease, Seizures, Stroke, Thyroid Problems, Tuberculosis. Social History Never smoker, Marital Status - Married, Alcohol Use - Never, Drug Use - No History, Caffeine Use - Moderate. Medical History Eyes Patient has history of Cataracts Denies history of Glaucoma Ear/Nose/Mouth/Throat Denies history of Chronic sinus problems/congestion, Middle ear problems Hematologic/Lymphatic [name], [name] ([id number]) [id number] Page 6 of 9 Patient has  history of Anemia Denies history of Hemophilia, Human Immunodeficiency Virus, Lymphedema, Sickle Cell Disease Respiratory Denies history of Aspiration, Asthma, Chronic Obstructive Pulmonary Disease (COPD), Pneumothorax, Sleep Apnea, Tuberculosis Cardiovascular Patient has history of Congestive Heart Failure, Hypertension Denies history of Angina, Arrhythmia, Coronary Artery Disease, Deep Vein Thrombosis, Hypotension, Myocardial Infarction,  Peripheral Arterial Disease, Peripheral Venous Disease, Phlebitis, Vasculitis Gastrointestinal Denies history of Cirrhosis , Colitis, Crohnoos, Hepatitis A, Hepatitis B, Hepatitis C Endocrine Denies history of Type I Diabetes, Type II Diabetes Genitourinary Denies history of End Stage Renal Disease Immunological Denies history of Lupus Erythematosus, Raynaudoos, Scleroderma Integumentary (Skin) Denies history of History of Burn Musculoskeletal Patient has history of Osteoarthritis Denies history of Gout, Rheumatoid Arthritis, Osteomyelitis Neurologic Denies history of Dementia, Neuropathy, Quadriplegia, Paraplegia, Seizure Disorder Oncologic Denies history of Received Chemotherapy Psychiatric Denies history of Anorexia/bulimia, Confinement Anxiety Hospitalization/Surgery History - right total knee replacement. Medical A Surgical History Notes nd Gastrointestinal diverticulosis hiatal hernia hyperlipidemia Musculoskeletal DJD Objective Constitutional respirations regular, non-labored and within target range for patient.. Vitals Time Taken: 9:25 AM, Height: 63 in, Temperature: 97.5 F, Pulse: 73 bpm, Respiratory Rate: 18 breaths/min, Blood Pressure: 135/79 mmHg. Cardiovascular 2+ dorsalis pedis/posterior tibialis pulses. Psychiatric pleasant and cooperative. General Notes: Right lower extremity: Granulation tissue with scant tightly adhered nonviable tissue. 2+ pitting edema to the knee. No signs of infection including increased warmth,  erythema or purulent drainage. Integumentary (Hair, Skin) Wound #3 status is Open. Original cause of wound was Trauma. The date acquired was: 06/20/2022. The wound has been in treatment 14 weeks. The wound is located on the Right,Lateral Lower Leg. The wound measures 0.9cm length x 1.2cm width x 0.1cm depth; 0.848cm^2 area and 0.085cm^3 volume. There is Fat Layer (Subcutaneous Tissue) exposed. There is no tunneling or undermining noted. There is a medium amount of serosanguineous drainage noted. The wound margin is distinct with the outline attached to the wound base. There is large (67-100%) red, pink granulation within the wound bed. There is a small (1-33%) amount of necrotic tissue within the wound bed including Adherent Slough. The periwound skin appearance had no abnormalities noted for texture. The periwound skin appearance had no abnormalities noted for moisture. The periwound skin appearance exhibited: Hemosiderin Staining. The periwound skin appearance did not exhibit: Atrophie Blanche, Cyanosis, Ecchymosis, Mottled, Pallor, Rubor, Erythema. Periwound temperature was noted as No Abnormality. Assessment Active Problems ICD-10 Non-pressure chronic ulcer of other part of left lower leg with fat layer exposed Non-pressure chronic ulcer of other part of right lower leg with fat layer exposed Chronic venous hypertension (idiopathic) with ulcer of bilateral lower extremity Type 2 diabetes mellitus with other skin ulcer Other early complications of trauma, initial encounter Unspecified open wound, right lower leg, initial encounter Unspecified open wound, left lower leg, initial encounter MCCAYLA, SHIMADA ([id number]) [id number] Page 7 of 9 Patient's wound appears well-healing. I recommended continuing the course with antibiotic ointment and Hydrofera Blue under Kerlix/Coban. Follow-up in 1 week. Plan Follow-up Appointments: Return Appointment in 1 week. - w/ Dr.  Heber [place] Return Appointment in 2 weeks. - Dr. Heber [place] Anesthetic: (In clinic) Topical Lidocaine 5% applied to wound bed Bathing/ Shower/ Hygiene: May shower with protection but do not get wound dressing(s) wet. Edema Control - Lymphedema / SCD / Other: Elevate legs to the level of the heart or above for 30 minutes daily and/or when sitting, a frequency of: - 2-3 times a day throughout the day. Avoid standing for long periods of time. Exercise regularly Moisturize legs daily. - lotion both legs every night before bed. Compression stocking or Garment 20-30 mm/Hg pressure to: - apply in the morning and remove at night. WOUND #3: - Lower Leg Wound Laterality: Right, Lateral Cleanser: Soap and Water 1 x Per Week/30 Days Discharge Instructions: May shower and wash wound with dial antibacterial soap  and water prior to dressing change. Peri-Wound Care: Sween Lotion (Moisturizing lotion) 1 x Per Week/30 Days Discharge Instructions: Apply moisturizing lotion as directed Topical: Gentamicin 1 x Per Week/30 Days Discharge Instructions: As directed by physician Topical: Mupirocin Ointment 1 x Per Week/30 Days Discharge Instructions: Apply Mupirocin (Bactroban) as instructed Prim Dressing: Hydrofera Blue Ready Transfer Foam, 2.5x2.5 (in/in) 1 x Per Week/30 Days ary Discharge Instructions: Apply directly to wound bed as directed Secondary Dressing: Woven Gauze Sponges 2x2 in 1 x Per Week/30 Days Discharge Instructions: Apply over primary dressing as directed. Com pression Wrap: Kerlix Roll 4.5x3.1 (in/yd) 1 x Per Week/30 Days Discharge Instructions: Apply Kerlix and Coban compression as directed. Com pression Wrap: Coban Self-Adherent Wrap 4x5 (in/yd) 1 x Per Week/30 Days Discharge Instructions: Apply over Kerlix as directed. 1. Hydrofera Blue and Keystone antibiotic ointment under Kerlix/Cobanooright lower extremity 2. Follow-up in 1 week Electronic Signature(s) Signed: 10/05/2022 11:20:58 AM By:  Kalman Shan DO Entered By: Kalman Shan on 10/05/2022 09:40:54 -------------------------------------------------------------------------------- HxROS Details Patient Name: Date of Service: [name], [place] RO [name] 10/05/2022 9:15 A M Medical Record Number: [id number] Patient Account Number: [id number] Date of Birth/Sex: Treating RN: 12-Dec-1945 (76 y.o. F) Primary Care Provider: [name] Other Clinician: Referring Provider: Treating Provider/Extender: Hollie Beach in Treatment: 19 Information Obtained From Patient Eyes Medical History: Positive for: Cataracts Negative for: Glaucoma Ear/Nose/Mouth/Throat Medical History: Negative for: Chronic sinus problems/congestion; Middle ear problems ERCILIA, BETTINGER ([id number]) [id number] Page 8 of 9 Hematologic/Lymphatic Medical History: Positive for: Anemia Negative for: Hemophilia; Human Immunodeficiency Virus; Lymphedema; Sickle Cell Disease Respiratory Medical History: Negative for: Aspiration; Asthma; Chronic Obstructive Pulmonary Disease (COPD); Pneumothorax; Sleep Apnea; Tuberculosis Cardiovascular Medical History: Positive for: Congestive Heart Failure; Hypertension Negative for: Angina; Arrhythmia; Coronary Artery Disease; Deep Vein Thrombosis; Hypotension; Myocardial Infarction; Peripheral Arterial Disease; Peripheral Venous Disease; Phlebitis; Vasculitis Gastrointestinal Medical History: Negative for: Cirrhosis ; Colitis; Crohns; Hepatitis A; Hepatitis B; Hepatitis C Past Medical History Notes: diverticulosis hiatal hernia hyperlipidemia Endocrine Medical History: Negative for: Type I Diabetes; Type II Diabetes Genitourinary Medical History: Negative for: End Stage Renal Disease Immunological Medical History: Negative for: Lupus Erythematosus; Raynauds; Scleroderma Integumentary (Skin) Medical History: Negative for: History of  Burn Musculoskeletal Medical History: Positive for: Osteoarthritis Negative for: Gout; Rheumatoid Arthritis; Osteomyelitis Past Medical History Notes: DJD Neurologic Medical History: Negative for: Dementia; Neuropathy; Quadriplegia; Paraplegia; Seizure Disorder Oncologic Medical History: Negative for: Received Chemotherapy Psychiatric Medical History: Negative for: Anorexia/bulimia; Confinement Anxiety HBO Extended History Items Eyes: Cataracts Immunizations Pneumococcal Vaccine: Received Pneumococcal Vaccination: Yes Received Pneumococcal Vaccination On or After [address], [place] ([id number]) [id number] Page 9 of 9 Implantable Devices None Hospitalization / Surgery History Type of Hospitalization/Surgery right total knee replacement Family and Social History Cancer: Yes - Siblings; Diabetes: Yes - Siblings; Heart Disease: Yes - Mother; Hereditary Spherocytosis: No; Hypertension: Yes - Mother; Kidney Disease: No; Lung Disease: No; Seizures: No; Stroke: No; Thyroid Problems: No; Tuberculosis: No; Never smoker; Marital Status - Married; Alcohol Use: Never; Drug Use: No History; Caffeine Use: Moderate; Financial Concerns: No; Food, Clothing or Shelter Needs: No; Support System Lacking: No; Transportation Concerns: No Electronic Signature(s) Signed: 10/05/2022 11:20:58 AM By: Kalman Shan DO Entered By: Kalman Shan on 10/05/2022 09:39:12 -------------------------------------------------------------------------------- SuperBill Details Patient Name: Date of Service: [name], [place] RO [name] 10/05/2022 Medical Record Number: [id number] Patient Account Number: [id number] Date of Birth/Sex: Treating RN: August 14, 1946 (76 y.o. 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 T79.8XXA Other early complications of trauma, initial encounter S81.801A Unspecified open wound, right lower leg, initial encounter S81.802A Unspecified open wound, left lower leg, initial encounter Facility Procedures : CPT4 Code: [id number] Description: 99213 - WOUND CARE VISIT-LEV 3 EST PT Modifier: Quantity: 1 Physician Procedures : CPT4 Code Description Modifier [id number] - WC PHYS LEVEL 3 - EST PT ICD-10 Diagnosis Description L97.812 Non-pressure chronic ulcer of other part of right lower leg with fat layer exposed I87.313 Chronic venous hypertension (idiopathic) with ulcer  of bilateral lower extremity E11.622 Type 2 diabetes mellitus with other skin ulcer T79.8XXA Other early complications of trauma, initial encounter Quantity: 1 Electronic Signature(s) Signed: 10/05/2022 11:20:58 AM By: Kalman Shan DO Entered By: Kalman Shan on 10/05/2022 09:41:20

## 2022-10-08 NOTE — Progress Notes (Signed)
LANIESHA, DAS ([id number]) [id number] Page 1 of 10 Visit Report for 10/05/2022 Arrival Information Details Patient Name: Date of Service: [name] [name] 10/05/2022 9:15 A M Medical Record Number: [id number] Patient Account Number: [id number] Date of Birth/Sex: Treating RN: 1946-03-11 (76 y.o. [name] Primary Care Yisrael Obryan: [name] Other Clinician: Referring Valeree Leidy: Treating Gerlean Cid/Extender: Hollie Beach in Treatment: 19 Visit Information History Since Last Visit Added or deleted any medications: No Patient Arrived: [name] Any new allergies or adverse reactions: No Arrival Time: 09:26 Had a fall or experienced change in No Accompanied By: Son activities of daily living that may affect Transfer Assistance: None risk of falls: Patient Identification Verified: Yes Signs or symptoms of abuse/neglect since last visito No Secondary Verification Process Completed: Yes Hospitalized since last visit: No Patient Requires Transmission-Based Precautions: No Implantable device outside of the clinic excluding No Patient Has Alerts: No cellular tissue based products placed in the center since last visit: Has Dressing in Place as Prescribed: Yes Has Compression in Place as Prescribed: No Pain Present Now: Yes Electronic Signature(s) Signed: 10/05/2022 3:55:49 PM By: Sharyn Creamer RN, BSN Entered By: Sharyn Creamer on 10/05/2022 09:27:18 -------------------------------------------------------------------------------- Clinic Level of Care Assessment Details Patient Name: Date of Service: [name], [name] [name] 10/05/2022 9:15 A M Medical Record Number: [id number] Patient Account Number: [id number] Date of Birth/Sex: Treating RN: [birth year] (76 y.o. [name], [name] Primary Care Altariq Goodall: [name] Other Clinician: Referring Copeland Neisen: Treating Marcine Gadway/Extender: Hollie Beach in Treatment:  19 Clinic Level of Care Assessment Items TOOL 4 Quantity Score X- 1 0 Use when only an EandM is performed on FOLLOW-UP visit ASSESSMENTS - Nursing Assessment / Reassessment X- 1 10 Reassessment of Co-morbidities (includes updates in patient status) X- 1 5 Reassessment of Adherence to Treatment Plan ASSESSMENTS - Wound and Skin A ssessment / Reassessment X - Simple Wound Assessment / Reassessment - one wound 1 5 _0  - 0 Complex Wound Assessment / Reassessment - multiple wounds _1  - 0 Dermatologic / Skin Assessment (not related to wound area) ASSESSMENTS - Focused Assessment X- 1 5 Circumferential Edema Measurements - multi extremities _2  - 0 Nutritional Assessment / Counseling / Intervention Brandy, [name] ([id number]) [id number] Page 2 of 10 _3  - 0 Lower Extremity Assessment (monofilament, tuning fork, pulses) _4  - 0 Peripheral Arterial Disease Assessment (using hand held doppler) ASSESSMENTS - Ostomy and/or Continence Assessment and Care _5  - 0 Incontinence Assessment and Management _6  - 0 Ostomy Care Assessment and Management (repouching, etc.) PROCESS - Coordination of Care X - Simple Patient / Family Education for ongoing care 1 15 _7  - 0 Complex (extensive) Patient / Family Education for ongoing care X- 1 10 Staff obtains Programmer, systems, Records, T Results / Process Orders est _8  - 0 Staff telephones HHA, Nursing Homes / Clarify orders / etc _9  - 0 Routine Transfer to another Facility (non-emergent condition) _10  - 0 Routine Hospital Admission (non-emergent condition) _11  - 0 New Admissions / Biomedical engineer / Ordering NPWT Apligraf, etc. , _12  - 0 Emergency Hospital Admission (emergent condition) X- 1 10 Simple Discharge Coordination _13  - 0 Complex (extensive) Discharge Coordination PROCESS - Special Needs _14  - 0 Pediatric / Minor Patient Management _15  - 0 Isolation Patient Management _16  - 0 Hearing / Language / Visual  special needs _17  - 0 Assessment of Community assistance (transportation, D/C planning, etc.) _18  - 0 Additional assistance / Altered mentation _19  - 0 Support Surface(s) Assessment (bed, cushion,  seat, etc.) INTERVENTIONS - Wound Cleansing / Measurement X - Simple Wound Cleansing - one wound 1 5 _0  - 0 Complex Wound Cleansing - multiple wounds X- 1 5 Wound Imaging (photographs - any number of wounds) _1  - 0 Wound Tracing (instead of photographs) X- 1 5 Simple Wound Measurement - one wound _2  - 0 Complex Wound Measurement - multiple wounds INTERVENTIONS - Wound Dressings _3  - 0 Small Wound Dressing one or multiple wounds X- 1 15 Medium Wound Dressing one or multiple wounds _4  - 0 Large Wound Dressing one or multiple wounds X- 1 5 Application of Medications - topical <[id number]>_9<\/GXQJJHERDEYCXKGY>_1  - 0 Application of Medications - injection INTERVENTIONS - Miscellaneous _6  - 0 External ear exam _7  - 0 Specimen Collection (cultures, biopsies, blood, body fluids, etc.) _8  - 0 Specimen(s) / Culture(s) sent or taken to Lab for analysis _9  - 0 Patient Transfer (multiple staff / Civil Service fast streamer / Similar devices) _10  - 0 Simple Staple / Suture removal (25 or less) _11  - 0 Complex Staple / Suture removal (26 or more) _12  - 0 Hypo / Hyperglycemic Management (close monitor of Blood Glucose) RICKITA, FORSTNER B ([id number]) [id number] Page 3 of 10 _13  - 0 Ankle / Brachial Index (ABI) - do not check if billed separately X- 1 5 Vital Signs Has the patient been seen at the hospital within the last three years: Yes Total Score: 100 Level Of Care: New/Established - Level 3 Electronic Signature(s) Signed: 10/07/2022 5:39:23 PM By: Rhae Hammock RN Entered By: Rhae Hammock on 10/05/2022 09:37:20 -------------------------------------------------------------------------------- Encounter Discharge Information Details Patient Name: Date of Service: [name], [place] RO [name] 10/05/2022 9:15 A  M Medical Record Number: [id number] Patient Account Number: [id number] Date of Birth/Sex: Treating RN: 1945-12-15 (76 y.o. [name], [name] Primary Care Danija Gosa: [name] Other Clinician: Referring Dajia Gunnels: Treating Lonnell Chaput/Extender: Hollie Beach in Treatment: 19 Encounter Discharge Information Items Discharge Condition: Stable Ambulatory Status: Ambulatory Discharge Destination: Home Transportation: Private Auto Accompanied By: son Schedule Follow-up Appointment: Yes Clinical Summary of Care: Patient Declined Electronic Signature(s) Signed: 10/07/2022 5:39:23 PM By: Rhae Hammock RN Entered By: Rhae Hammock on 10/05/2022 09:37:50 -------------------------------------------------------------------------------- Lower Extremity Assessment Details Patient Name: Date of Service: [name] RO [name] 10/05/2022 9:15 A M Medical Record Number: [id number] Patient Account Number: [id number] Date of Birth/Sex: Treating RN: Jan 01, 1946 (76 y.o. [name] Primary Care Krystine Pabst: [name] Other Clinician: Referring Magenta Schmiesing: Treating Emmersyn Kratzke/Extender: Hollie Beach in Treatment: 19 Edema Assessment Assessed: [name]: No] [Right: No] Edema: [Left: Ye] [Right: s] Calf Left: Right: Point of Measurement: 41 cm From Medial Instep 42.5 cm Ankle Left: Right: Point of Measurement: 8 cm From Medial Instep 25.5 cm Vascular Assessment Left: [[id number] Page 4 of 10Right:] Pulses: Dorsalis Pedis Palpable: [[id number] Page 4 of 10Yes] Electronic Signature(s) Signed: 10/05/2022 3:55:49 PM By: Sharyn Creamer RN, BSN Entered By: Sharyn Creamer on 10/05/2022 09:28:43 -------------------------------------------------------------------------------- Multi Wound Chart Details Patient Name: Date of Service: [name], [place] [name] 10/05/2022 9:15 A M Medical Record Number:  [id number] Patient Account Number: [id number] Date of Birth/Sex: Treating RN: [birth year] (76 y.o. F) Primary Care Silver Parkey: [name] Other Clinician: Referring Daneesha Quinteros: Treating Milicent Acheampong/Extender: Hollie Beach in Treatment: 19 Vital Signs Height(in): 53 Pulse(bpm): 40 Weight(lbs): Blood Pressure(mmHg): 135/79 Body Mass Index(BMI): Temperature(F): 97.5 Respiratory Rate(breaths/min): 18 [3:Photos:] [N/A:N/A] Right, Lateral Lower Leg N/A N/A Wound Location: Trauma N/A N/A Wounding Event: Trauma, Other N/A N/A Primary Etiology: Cataracts, Anemia, Congestive Heart  N/A N/A Comorbid History: Failure, Hypertension, Osteoarthritis 06/20/2022 N/A N/A Date Acquired: 14 N/A N/A Weeks of Treatment: Open N/A N/A Wound Status: No N/A N/A Wound Recurrence: 0.9x1.2x0.1 N/A N/A Measurements L x W x D (cm) 0.848 N/A N/A A (cm) : rea 0.085 N/A N/A Volume (cm) : 97.80% N/A N/A % Reduction in Area: 98.90% N/A N/A % Reduction in Volume: Full Thickness With Exposed Support N/A N/A Classification: Structures Medium N/A N/A Exudate Amount: Serosanguineous N/A N/A Exudate Type: red, brown N/A N/A Exudate Color: Distinct, outline attached N/A N/A Wound Margin: Large (67-100%) N/A N/A Granulation Amount: Red, Pink N/A N/A Granulation Quality: Small (1-33%) N/A N/A Necrotic Amount: Fat Layer (Subcutaneous Tissue): Yes N/A N/A Exposed Structures: Fascia: No Tendon: No Muscle: No Joint: No Bone: No Large (67-100%) N/A N/A Epithelialization: Excoriation: No N/A N/A Periwound Skin Texture: Induration: No Callus: No Crepitus: No SHERYN, ALDAZ B ([id number]) [phone].pdf Page 5 of 10 Rash: No Scarring: No Maceration: No N/A N/A Periwound Skin Moisture: Dry/Scaly: No Hemosiderin Staining: Yes N/A N/A Periwound Skin Color: Atrophie Blanche: No Cyanosis: No Ecchymosis: No Erythema: No Mottled: No Pallor: No Rubor:  No No Abnormality N/A N/A Temperature: Treatment Notes Wound #3 (Lower Leg) Wound Laterality: Right, Lateral Cleanser Soap and Water Discharge Instruction: May shower and wash wound with dial antibacterial soap and water prior to dressing change. Peri-Wound Care Sween Lotion (Moisturizing lotion) Discharge Instruction: Apply moisturizing lotion as directed Topical Gentamicin Discharge Instruction: As directed by physician Mupirocin Ointment Discharge Instruction: Apply Mupirocin (Bactroban) as instructed Primary Dressing Hydrofera Blue Ready Transfer Foam, 2.5x2.5 (in/in) Discharge Instruction: Apply directly to wound bed as directed Secondary Dressing Woven Gauze Sponges 2x2 in Discharge Instruction: Apply over primary dressing as directed. Secured With Compression Wrap Kerlix Roll 4.5x3.1 (in/yd) Discharge Instruction: Apply Kerlix and Coban compression as directed. Coban Self-Adherent Wrap 4x5 (in/yd) Discharge Instruction: Apply over Kerlix as directed. Compression Stockings Add-Ons Electronic Signature(s) Signed: 10/05/2022 11:20:58 AM By: Kalman Shan DO Entered By: Kalman Shan on 10/05/2022 09:38:13 -------------------------------------------------------------------------------- Multi-Disciplinary Care Plan Details Patient Name: Date of Service: [name], [place] [name] 10/05/2022 9:15 A M Medical Record Number: [id number] Patient Account Number: [id number] Date of Birth/Sex: Treating RN: 1946-05-23 (76 y.o. [name], [name] Primary Care Niasha Devins: [name] Other Clinician: Referring Mory Herrman: Treating Zendayah Hardgrave/Extender: Hollie Beach in Treatment: [address] [place] B ([id number]) [id number] Page 6 of 10 Active Inactive Pain, Acute or Chronic Nursing Diagnoses: Pain, acute or chronic: actual or potential Potential alteration in comfort, pain Goals: Patient will verbalize adequate pain control and  receive pain control interventions during procedures as needed Date Initiated: 05/25/2022 Date Inactivated: 09/29/2022 Target Resolution Date: 09/29/2022 Goal Status: Met Patient/caregiver will verbalize comfort level met Date Initiated: 05/25/2022 Target Resolution Date: 10/09/2022 Goal Status: Active Interventions: Complete pain assessment as per visit requirements Encourage patient to take pain medications as prescribed Provide education on pain management Treatment Activities: Administer pain control measures as ordered : 05/25/2022 Notes: Venous Leg Ulcer Nursing Diagnoses: Knowledge deficit related to disease process and management Goals: Non-invasive venous studies are completed as ordered Date Initiated: 05/25/2022 Target Resolution Date: 10/16/2022 Goal Status: Active Interventions: Assess peripheral edema status every visit. Provide education on venous insufficiency Treatment Activities: Non-invasive vascular studies : 05/25/2022 Notes: Wound/Skin Impairment Nursing Diagnoses: Knowledge deficit related to ulceration/compromised skin integrity Goals: Patient/caregiver will verbalize understanding of skin care regimen Date Initiated: 05/25/2022 Date Inactivated: 09/29/2022 Target Resolution Date: 09/29/2022 Goal Status: Met Ulcer/skin breakdown will heal within  14 weeks Date Initiated: 05/25/2022 Target Resolution Date: 10/16/2022 Goal Status: Active Interventions: Assess patient/caregiver ability to obtain necessary supplies Assess patient/caregiver ability to perform ulcer/skin care regimen upon admission and as needed Provide education on ulcer and skin care Treatment Activities: Skin care regimen initiated : 05/25/2022 Topical wound management initiated : 05/25/2022 Notes: Electronic Signature(s) Signed: 10/07/2022 5:39:23 PM By: Rhae Hammock RN Entered By: Rhae Hammock on 10/05/2022 09:31:54 [name] ([id number]) [id number]  Page 7 of 10 -------------------------------------------------------------------------------- Pain Assessment Details Patient Name: Date of Service: [name] [name] 10/05/2022 9:15 A M Medical Record Number: [id number] Patient Account Number: [id number] Date of Birth/Sex: Treating RN: 08/14/1946 (76 y.o. [name] Primary Care Tylicia Sherman: [name] Other Clinician: Referring Lynley Killilea: Treating Ariba Lehnen/Extender: Hollie Beach in Treatment: 19 Active Problems Location of Pain Severity and Description of Pain Patient Has Paino Yes Site Locations Rate the pain. Current Pain Level: 6 Pain Management and Medication Current Pain Management: Electronic Signature(s) Signed: 10/05/2022 3:55:49 PM By: Sharyn Creamer RN, BSN Entered By: Sharyn Creamer on 10/05/2022 09:27:53 -------------------------------------------------------------------------------- Patient/Caregiver Education Details Patient Name: Date of Service: [name], [place] [name] 12/18/2023andnbsp9:15 A M Medical Record Number: [id number] Patient Account Number: [id number] Date of Birth/Gender: Treating RN: 1946/03/22 (75 y.o. [name], [name] Primary Care Physician: [name] Other Clinician: Referring Physician: Treating Physician/Extender: Hollie Beach in Treatment: 19 Education Assessment Education Provided To: Patient Education Topics Provided Wound/Skin Impairment: Methods: Explain/Verbal Responses: Reinforcements needed, State content correctly KAIRY, FOLSOM ([id number]) [phone].pdf Page 8 of 10 Electronic Signature(s) Signed: 10/07/2022 5:39:23 PM By: Rhae Hammock RN Entered By: Rhae Hammock on 10/05/2022 09:32:08 -------------------------------------------------------------------------------- Wound Assessment Details Patient Name: Date of Service: [name], [place] RO [name] 10/05/2022 9:15 A M Medical Record Number:  [id number] Patient Account Number: [id number] Date of Birth/Sex: Treating RN: 1946/04/08 (76 y.o. [name] Primary Care Halo Laski: [name] Other Clinician: Referring Sarahjane Matherly: Treating Cobe Viney/Extender: Hollie Beach in Treatment: 19 Wound Status Wound Number: 3 Primary Trauma, Other Etiology: Wound Location: Right, Lateral Lower Leg Wound Status: Open Wounding Event: Trauma Comorbid Cataracts, Anemia, Congestive Heart Failure, Hypertension, Date Acquired: 06/20/2022 History: Osteoarthritis Weeks Of Treatment: 14 Clustered Wound: No Photos Wound Measurements Length: (cm) 0.9 Width: (cm) 1.2 Depth: (cm) 0.1 Area: (cm) 0.848 Volume: (cm) 0.085 % Reduction in Area: 97.8% % Reduction in Volume: 98.9% Epithelialization: Large (67-100%) Tunneling: No Undermining: No Wound Description Classification: Full Thickness With Exposed Support Structures Wound Margin: Distinct, outline attached Exudate Amount: Medium Exudate Type: Serosanguineous Exudate Color: red, brown Foul Odor After Cleansing: No Slough/Fibrino Yes Wound Bed Granulation Amount: Large (67-100%) Exposed Structure Granulation Quality: Red, Pink Fascia Exposed: No Necrotic Amount: Small (1-33%) Fat Layer (Subcutaneous Tissue) Exposed: Yes Necrotic Quality: Adherent Slough Tendon Exposed: No Muscle Exposed: No Joint Exposed: No Bone Exposed: No Periwound Skin Texture Texture Color No Abnormalities Noted: Yes No Abnormalities Noted: No Atrophie Blanche: No Moisture Cyanosis: No No Abnormalities Noted: Yes Ecchymosis: No Brandy, [name] B ([id number]) [id number] Page 9 of 10 Erythema: No Hemosiderin Staining: Yes Mottled: No Pallor: No Rubor: No Temperature / Pain Temperature: No Abnormality Treatment Notes Wound #3 (Lower Leg) Wound Laterality: Right, Lateral Cleanser Soap and Water Discharge Instruction: May shower and wash wound with  dial antibacterial soap and water prior to dressing change. Peri-Wound Care Sween Lotion (Moisturizing lotion) Discharge Instruction: Apply moisturizing lotion as directed Topical Gentamicin Discharge Instruction: As directed by physician Mupirocin Ointment Discharge  Instruction: Apply Mupirocin (Bactroban) as instructed Primary Dressing Hydrofera Blue Ready Transfer Foam, 2.5x2.5 (in/in) Discharge Instruction: Apply directly to wound bed as directed Secondary Dressing Woven Gauze Sponges 2x2 in Discharge Instruction: Apply over primary dressing as directed. Secured With Compression Wrap Kerlix Roll 4.5x3.1 (in/yd) Discharge Instruction: Apply Kerlix and Coban compression as directed. Coban Self-Adherent Wrap 4x5 (in/yd) Discharge Instruction: Apply over Kerlix as directed. Compression Stockings Add-Ons Electronic Signature(s) Signed: 10/05/2022 3:55:49 PM By: Sharyn Creamer RN, BSN Entered By: Sharyn Creamer on 10/05/2022 09:29:51 -------------------------------------------------------------------------------- [name] Details Patient Name: Date of Service: [name], [place] RO [name] 10/05/2022 9:15 A M Medical Record Number: [id number] Patient Account Number: [id number] Date of Birth/Sex: Treating RN: 06-May-1946 (76 y.o. [name] Primary Care Nik Gorrell: [name] Other Clinician: Referring Avie Checo: Treating Markeya Mincy/Extender: Hollie Beach in Treatment: 19 Vital Signs Time Taken: 09:25 Temperature (F): 97.5 Height (in): 63 Pulse (bpm): 73 Respiratory Rate (breaths/min): 18 Blood Pressure (mmHg): 135/79 Reference Range: 80 - 120 mg / dl BRITANEE, VANBLARCOM B ([id number]) [id number] Page 10 of 10 Electronic Signature(s) Signed: 10/05/2022 3:55:49 PM By: Sharyn Creamer RN, BSN Entered By: Sharyn Creamer on 10/05/2022 09:27:43

## 2022-10-13 ENCOUNTER — Encounter (HOSPITAL_BASED_OUTPATIENT_CLINIC_OR_DEPARTMENT_OTHER): Payer: Medicare HMO | Admitting: Internal Medicine

## 2022-10-13 DIAGNOSIS — L97812 Non-pressure chronic ulcer of other part of right lower leg with fat layer exposed: Secondary | ICD-10-CM

## 2022-10-13 DIAGNOSIS — T798XXA Other early complications of trauma, initial encounter: Secondary | ICD-10-CM | POA: Diagnosis not present

## 2022-10-13 DIAGNOSIS — E11622 Type 2 diabetes mellitus with other skin ulcer: Secondary | ICD-10-CM

## 2022-10-13 DIAGNOSIS — N183 Chronic kidney disease, stage 3 unspecified: Secondary | ICD-10-CM | POA: Diagnosis not present

## 2022-10-13 DIAGNOSIS — E1122 Type 2 diabetes mellitus with diabetic chronic kidney disease: Secondary | ICD-10-CM | POA: Diagnosis not present

## 2022-10-13 DIAGNOSIS — L97822 Non-pressure chronic ulcer of other part of left lower leg with fat layer exposed: Secondary | ICD-10-CM | POA: Diagnosis not present

## 2022-10-13 DIAGNOSIS — I87313 Chronic venous hypertension (idiopathic) with ulcer of bilateral lower extremity: Secondary | ICD-10-CM

## 2022-10-13 DIAGNOSIS — I87311 Chronic venous hypertension (idiopathic) with ulcer of right lower extremity: Secondary | ICD-10-CM | POA: Diagnosis not present

## 2022-10-14 NOTE — Progress Notes (Signed)
Brandy, [name] ([id number]) [id number] Brandy 1 of 9 Visit Report for 10/13/2022 Arrival Information Details Patient Name: Date of Service: [name] [name]. 10/13/2022 10:00 A M Medical Record Number: [id number] Patient Account Number: [id number] Date of Birth/Sex: Treating RN: [birth year] (76 y.o. F) Primary Care [name]: [name] Other Clinician: Referring Eliot Popper: Treating Keishaun Hazel/Extender: Hollie Beach in Treatment: 40 Visit Information History Since Last Visit All ordered tests and consults were completed: No Patient Arrived: [name] Added or deleted any medications: No Arrival Time: 10:20 Any new allergies or adverse reactions: No Accompanied By: family Had a fall or experienced change in No Transfer Assistance: None activities of daily living that may affect Patient Identification Verified: Yes risk of falls: Secondary Verification Process Completed: Yes Signs or symptoms of abuse/neglect since last visito No Patient Requires Transmission-Based Precautions: No Hospitalized since last visit: No Patient Has Alerts: No Implantable device outside of the clinic excluding No cellular tissue based products placed in the center since last visit: Pain Present Now: No Electronic Signature(s) Signed: 10/13/2022 1:45:12 PM By: Worthy Rancher Entered By: Worthy Rancher on 10/13/2022 10:21:00 -------------------------------------------------------------------------------- Clinic Level of Care Assessment Details Patient Name: Date of Service: [name] [name]. 10/13/2022 10:00 A M Medical Record Number: [id number] Patient Account Number: [id number] Date of Birth/Sex: Treating RN: [birth year] (76 y.o. [name] Primary Care Benjamine Strout: [name] Other Clinician: Referring Kia Stavros: Treating Dmarco Baldus/Extender: Hollie Beach in Treatment: 20 Clinic Level of Care Assessment Items TOOL 4 Quantity  Score X- 1 0 Use when only an EandM is performed on FOLLOW-UP visit ASSESSMENTS - Nursing Assessment / Reassessment X- 1 10 Reassessment of Co-morbidities (includes updates in patient status) X- 1 5 Reassessment of Adherence to Treatment Plan ASSESSMENTS - Wound and Skin A ssessment / Reassessment X - Simple Wound Assessment / Reassessment - one wound 1 5 _0  - 0 Complex Wound Assessment / Reassessment - multiple wounds _1  - 0 Dermatologic / Skin Assessment (not related to wound area) ASSESSMENTS - Focused Assessment X- 1 5 Circumferential Edema Measurements - multi extremities _2  - 0 Nutritional Assessment / Counseling / Intervention [name], [name] ([id number]) [id number] Brandy 2 of 9 _3  - 0 Lower Extremity Assessment (monofilament, tuning fork, pulses) _4  - 0 Peripheral Arterial Disease Assessment (using hand held doppler) ASSESSMENTS - Ostomy and/or Continence Assessment and Care _5  - 0 Incontinence Assessment and Management _6  - 0 Ostomy Care Assessment and Management (repouching, etc.) PROCESS - Coordination of Care X - Simple Patient / Family Education for ongoing care 1 15 _7  - 0 Complex (extensive) Patient / Family Education for ongoing care X- 1 10 Staff obtains Programmer, systems, Records, T Results / Process Orders est _8  - 0 Staff telephones HHA, Nursing Homes / Clarify orders / etc _9  - 0 Routine Transfer to another Facility (non-emergent condition) _10  - 0 Routine Hospital Admission (non-emergent condition) _11  - 0 New Admissions / Biomedical engineer / Ordering NPWT Apligraf, etc. , _12  - 0 Emergency Hospital Admission (emergent condition) X- 1 10 Simple Discharge Coordination _13  - 0 Complex (extensive) Discharge Coordination PROCESS - Special Needs _14  - 0 Pediatric / Minor Patient Management _15  - 0 Isolation Patient Management _16  - 0 Hearing / Language / Visual special needs _17  - 0 Assessment of Community assistance  (transportation, D/C planning, etc.) _18  - 0 Additional assistance / Altered mentation _19  - 0 Support Surface(s) Assessment (bed, cushion, seat, etc.) INTERVENTIONS - Wound Cleansing / Measurement X -  Simple Wound Cleansing - one wound 1 5 _0  - 0 Complex Wound Cleansing - multiple wounds X- 1 5 Wound Imaging (photographs - any number of wounds) _1  - 0 Wound Tracing (instead of photographs) X- 1 5 Simple Wound Measurement - one wound _2  - 0 Complex Wound Measurement - multiple wounds INTERVENTIONS - Wound Dressings _3  - 0 Small Wound Dressing one or multiple wounds _4  - 0 Medium Wound Dressing one or multiple wounds X- 1 20 Large Wound Dressing one or multiple wounds <OZDGUYQIHKVQQVZD>_6<\/LOVFIEPPIRJJOACZ>_6  - 0 Application of Medications - topical <SAYTKZSWFUXNATFT>_7<\/DUKGURKYHCWCBJSE>_8  - 0 Application of Medications - injection INTERVENTIONS - Miscellaneous _7  - 0 External ear exam _8  - 0 Specimen Collection (cultures, biopsies, blood, body fluids, etc.) _9  - 0 Specimen(s) / Culture(s) sent or taken to Lab for analysis _10  - 0 Patient Transfer (multiple staff / Civil Service fast streamer / Similar devices) _11  - 0 Simple Staple / Suture removal (25 or less) _12  - 0 Complex Staple / Suture removal (26 or more) _13  - 0 Hypo / Hyperglycemic Management (close monitor of Blood Glucose) [name], [name] ([id number]) [id number] Brandy 3 of 9 _14  - 0 Ankle / Brachial Index (ABI) - do not check if billed separately X- 1 5 Vital Signs Has the patient been seen at the hospital within the last three years: Yes Total Score: 100 Level Of Care: New/Established - Level 3 Electronic Signature(s) Signed: 10/13/2022 5:26:14 PM By: [name] RN, BSN Entered By: [name] on 10/13/2022 14:11:20 -------------------------------------------------------------------------------- Encounter Discharge Information Details Patient Name: Date of Service: [name], Brandy RO [name]. 10/13/2022 10:00 A M Medical Record Number: [id number] Patient Account Number:  [id number] Date of Birth/Sex: Treating RN: 26-Feb-1946 (76 y.o. [name], [name] Primary Care Charlies Rayburn: [name] Other Clinician: Referring Tamelia Michalowski: Treating Kyng Matlock/Extender: Hollie Beach in Treatment: 20 Encounter Discharge Information Items Discharge Condition: Stable Ambulatory Status: Walker Discharge Destination: Home Transportation: Private Auto Accompanied By: son Schedule Follow-up Appointment: Yes Clinical Summary of Care: Electronic Signature(s) Signed: 10/13/2022 5:26:14 PM By: [name] RN, BSN Entered By: [name] on 10/13/2022 14:11:52 -------------------------------------------------------------------------------- Lower Extremity Assessment Details Patient Name: Date of Service: Brandy Busing RO [name]. 10/13/2022 10:00 A M Medical Record Number: [id number] Patient Account Number: [id number] Date of Birth/Sex: Treating RN: 05-Aug-1946 (76 y.o. F) Primary Care Lujuana Kapler: [name] Other Clinician: Referring Celester Lech: Treating Isiaih Hollenbach/Extender: Hollie Beach in Treatment: 20 Edema Assessment Assessed: [Left: No] [Right: No] Edema: [Left: Ye] [Right: s] Calf Left: Right: Point of Measurement: 41 cm From Medial Instep 42 cm Ankle Left: Right: Point of Measurement: 8 cm From Medial Instep 25.8 cm Vascular Assessment [name], [name] ([id number]) [Right:[id number] Brandy 4 of 9] Pulses: Dorsalis Pedis Palpable: [Right:No] Electronic Signature(s) Signed: 10/13/2022 1:45:12 PM By: Worthy Rancher Entered By: Worthy Rancher on 10/13/2022 10:24:39 -------------------------------------------------------------------------------- Multi Wound Chart Details Patient Name: Date of Service: [name], Brandy RO [name]. 10/13/2022 10:00 A M Medical Record Number: [id number] Patient Account Number: [id number] Date of Birth/Sex: Treating RN: 03/31/1946 (76 y.o. F) Primary Care Aleiah Mohammed: [name] Other  Clinician: Referring Tareka Jhaveri: Treating Tashaun Obey/Extender: Hollie Beach in Treatment: 20 Vital Signs Height(in): 88 Pulse(bpm): 71 Weight(lbs): Blood Pressure(mmHg): 128/80 Body Mass Index(BMI): Temperature(F): 97.5 Respiratory Rate(breaths/min): 20 [3:Photos:] [N/A:N/A] Right, Lateral Lower Leg N/A N/A Wound Location: Trauma N/A N/A Wounding Event: Trauma, Other N/A N/A Primary Etiology: Cataracts, Anemia, Congestive Heart N/A N/A Comorbid History: Failure, Hypertension, Osteoarthritis 06/20/2022 N/A N/A Date Acquired: 16 N/A N/A  Weeks of Treatment: Open N/A N/A Wound Status: No N/A N/A Wound Recurrence: 0.7x0.7x0.1 N/A N/A Measurements L x W x D (cm) 0.385 N/A N/A A (cm) : rea 0.038 N/A N/A Volume (cm) : 99.00% N/A N/A % Reduction in Area: 99.50% N/A N/A % Reduction in Volume: Full Thickness With Exposed Support N/A N/A Classification: Structures Medium N/A N/A Exudate Amount: Serosanguineous N/A N/A Exudate Type: red, brown N/A N/A Exudate Color: Distinct, outline attached N/A N/A Wound Margin: Large (67-100%) N/A N/A Granulation Amount: Red, Pink N/A N/A Granulation Quality: Small (1-33%) N/A N/A Necrotic Amount: Fat Layer (Subcutaneous Tissue): Yes N/A N/A Exposed Structures: Fascia: No Tendon: No Muscle: No Joint: No Bone: No Large (67-100%) N/A N/A Epithelialization: Excoriation: No N/A N/A Periwound Skin Texture: Induration: No Callus: No Crepitus: No Rash: No [name], [name] ([id number]) [id number] Brandy 5 of 9 Scarring: No Maceration: No N/A N/A Periwound Skin Moisture: Dry/Scaly: No Hemosiderin Staining: Yes N/A N/A Periwound Skin Color: Atrophie Blanche: No Cyanosis: No Ecchymosis: No Erythema: No Mottled: No Pallor: No Rubor: No No Abnormality N/A N/A Temperature: Treatment Notes Electronic Signature(s) Signed: 10/13/2022 3:18:07 PM By: Kalman Shan  DO Entered By: Kalman Shan on 10/13/2022 11:18:52 -------------------------------------------------------------------------------- Multi-Disciplinary Care Plan Details Patient Name: Date of Service: [name], Brandy RO [name]. 10/13/2022 10:00 A M Medical Record Number: [id number] Patient Account Number: [id number] Date of Birth/Sex: Treating RN: [birth year] (76 y.o. [name], [name] Primary Care Kiannah Grunow: [name] Other Clinician: Referring Tenishia Ekman: Treating Shaun Runyon/Extender: Hollie Beach in Treatment: 20 Active Inactive Pain, Acute or Chronic Nursing Diagnoses: Pain, acute or chronic: actual or potential Potential alteration in comfort, pain Goals: Patient will verbalize adequate pain control and receive pain control interventions during procedures as needed Date Initiated: 05/25/2022 Date Inactivated: 09/29/2022 Target Resolution Date: 09/29/2022 Goal Status: Met Patient/caregiver will verbalize comfort level met Date Initiated: 05/25/2022 Target Resolution Date: 11/20/2022 Goal Status: Active Interventions: Complete pain assessment as per visit requirements Encourage patient to take pain medications as prescribed Provide education on pain management Treatment Activities: Administer pain control measures as ordered : 05/25/2022 Notes: Venous Leg Ulcer Nursing Diagnoses: Knowledge deficit related to disease process and management Goals: Non-invasive venous studies are completed as ordered Date Initiated: 05/25/2022 Target Resolution Date: 11/19/2022 Goal Status: Active Interventions: Assess peripheral edema status every visit. Provide education on venous insufficiency [name], [name] ([id number]) [id number] Brandy 6 of 9 Treatment Activities: Non-invasive vascular studies : 05/25/2022 Notes: Wound/Skin Impairment Nursing Diagnoses: Knowledge deficit related to ulceration/compromised skin integrity Goals: Patient/caregiver  will verbalize understanding of skin care regimen Date Initiated: 05/25/2022 Date Inactivated: 09/29/2022 Target Resolution Date: 09/29/2022 Goal Status: Met Ulcer/skin breakdown will heal within 14 weeks Date Initiated: 05/25/2022 Target Resolution Date: 11/20/2022 Goal Status: Active Interventions: Assess patient/caregiver ability to obtain necessary supplies Assess patient/caregiver ability to perform ulcer/skin care regimen upon admission and as needed Provide education on ulcer and skin care Treatment Activities: Skin care regimen initiated : 05/25/2022 Topical wound management initiated : 05/25/2022 Notes: Electronic Signature(s) Signed: 10/13/2022 1:12:16 PM By: [name] RN, BSN Entered By: [name] on 10/13/2022 10:59:03 -------------------------------------------------------------------------------- Pain Assessment Details Patient Name: Date of Service: [name], Brandy RO [name]. 10/13/2022 10:00 A M Medical Record Number: [id number] Patient Account Number: [id number] Date of Birth/Sex: Treating RN: August 21, 1946 (76 y.o. F) Primary Care Reece Fehnel: [name] Other Clinician: Referring Special Ranes: Treating Aidyn Kellis/Extender: Hollie Beach in Treatment: 20 Active Problems Location of Pain Severity and Description of Pain Patient  Has Paino No Site Locations Pain Management and Medication Current Pain Management: CAELEIGH, PROHASKA ([id number]) [id number] Brandy 7 of 9 Electronic Signature(s) Signed: 10/13/2022 1:45:12 PM By: Worthy Rancher Entered By: Worthy Rancher on 10/13/2022 10:22:47 -------------------------------------------------------------------------------- Patient/Caregiver Education Details Patient Name: Date of Service: [name], Brandy RO [name]. 12/26/2023andnbsp10:00 A M Medical Record Number: [id number] Patient Account Number: [id number] Date of Birth/Gender: Treating RN: 1946-05-30 (76 y.o. [name] Primary Care  Physician: [name] Other Clinician: Referring Physician: Treating Physician/Extender: Hollie Beach in Treatment: 20 Education Assessment Education Provided To: Patient Education Topics Provided Wound/Skin Impairment: Handouts: Caring for Your Ulcer Methods: Explain/Verbal Responses: Reinforcements needed Electronic Signature(s) Signed: 10/13/2022 1:12:16 PM By: [name] RN, BSN Entered By: [name] on 10/13/2022 10:59:15 -------------------------------------------------------------------------------- Wound Assessment Details Patient Name: Date of Service: [name], Brandy RO [name]. 10/13/2022 10:00 A M Medical Record Number: [id number] Patient Account Number: [id number] Date of Birth/Sex: Treating RN: 1946/06/14 (76 y.o. F) Primary Care Chevon Fomby: [name] Other Clinician: Referring Titianna Loomis: Treating Lewis Keats/Extender: Hollie Beach in Treatment: 20 Wound Status Wound Number: 3 Primary Trauma, Other Etiology: Wound Location: Right, Lateral Lower Leg Wound Status: Open Wounding Event: Trauma Comorbid Cataracts, Anemia, Congestive Heart Failure, Hypertension, Date Acquired: 06/20/2022 History: Osteoarthritis Weeks Of Treatment: 16 Clustered Wound: No Photos [name], [name] ([id number]) [id number] Brandy 8 of 9 Wound Measurements Length: (cm) 0.7 Width: (cm) 0.7 Depth: (cm) 0.1 Area: (cm) 0.385 Volume: (cm) 0.038 % Reduction in Area: 99% % Reduction in Volume: 99.5% Epithelialization: Large (67-100%) Tunneling: No Undermining: No Wound Description Classification: Full Thickness With Exposed Support Structures Wound Margin: Distinct, outline attached Exudate Amount: Medium Exudate Type: Serosanguineous Exudate Color: red, brown Foul Odor After Cleansing: No Slough/Fibrino Yes Wound Bed Granulation Amount: Large (67-100%) Exposed Structure Granulation Quality: Red, Pink Fascia  Exposed: No Necrotic Amount: Small (1-33%) Fat Layer (Subcutaneous Tissue) Exposed: Yes Necrotic Quality: Adherent Slough Tendon Exposed: No Muscle Exposed: No Joint Exposed: No Bone Exposed: No Periwound Skin Texture Texture Color No Abnormalities Noted: Yes No Abnormalities Noted: No Atrophie Blanche: No Moisture Cyanosis: No No Abnormalities Noted: Yes Ecchymosis: No Erythema: No Hemosiderin Staining: Yes Mottled: No Pallor: No Rubor: No Temperature / Pain Temperature: No Abnormality Treatment Notes Wound #3 (Lower Leg) Wound Laterality: Right, Lateral Cleanser Soap and Water Discharge Instruction: May shower and wash wound with dial antibacterial soap and water prior to dressing change. Peri-Wound Care Sween Lotion (Moisturizing lotion) Discharge Instruction: Apply moisturizing lotion as directed Topical Gentamicin Discharge Instruction: As directed by physician Mupirocin Ointment Discharge Instruction: Apply Mupirocin (Bactroban) as instructed Primary Dressing Hydrofera Blue Ready Transfer Foam, 2.5x2.5 (in/in) Discharge Instruction: Apply directly to wound bed as directed Secondary Dressing [name], [name] ([id number]) [id number].pdf Brandy 9 of 9 Woven Gauze Sponges 2x2 in Discharge Instruction: Apply over primary dressing as directed. Secured With Compression Wrap Kerlix Roll 4.5x3.1 (in/yd) Discharge Instruction: Apply Kerlix and Coban compression as directed. Coban Self-Adherent Wrap 4x5 (in/yd) Discharge Instruction: Apply over Kerlix as directed. Compression Stockings Add-Ons Electronic Signature(s) Signed: 10/14/2022 12:22:10 PM By: Brandy Creamer RN, BSN Entered By: [name] on 10/13/2022 10:26:00 -------------------------------------------------------------------------------- Vitals Details Patient Name: Date of Service: [name], Brandy RO [name]. 10/13/2022 10:00 A M Medical Record Number: [id number] Patient Account  Number: [id number] Date of Birth/Sex: Treating RN: [birth year] (76 y.o. F) Primary Care Daniella Dewberry: [name] Other Clinician: Referring Adjoa Althouse: Treating Keaton Beichner/Extender: Hollie Beach in Treatment: 20 Vital Signs Time Taken: 10:21  Temperature (F): 97.5 Height (in): 63 Pulse (bpm): 71 Respiratory Rate (breaths/min): 20 Blood Pressure (mmHg): 128/80 Reference Range: 80 - 120 mg / dl Electronic Signature(s) Signed: 10/13/2022 1:45:12 PM By: Worthy Rancher Entered By: Worthy Rancher on 10/13/2022 10:21:31

## 2022-10-14 NOTE — Progress Notes (Signed)
ASMI, FUGERE ([id number]) [id number] Page 1 of 9 Visit Report for 10/13/2022 Chief Complaint Document Details Patient Name: Date of Service: [name] [name]. 10/13/2022 10:00 A M Medical Record Number: [id number] Patient Account Number: [id number] Date of Birth/Sex: Treating RN: 1946-08-17 (76 y.o. F) Primary Care Provider: [name] Other Clinician: Referring Provider: Treating Provider/Extender: Hollie Beach in Treatment: 20 Information Obtained from: Patient Chief Complaint 05/25/2022; left lower extremity wound following a dog scratch 06/23/2022; lacerations to the right and left lower extremity status post fall Electronic Signature(s) Signed: 10/13/2022 3:18:07 PM By: Kalman Shan DO Entered By: Kalman Shan on 10/13/2022 11:19:00 -------------------------------------------------------------------------------- HPI Details Patient Name: Date of Service: [name], [place] RO [name]. 10/13/2022 10:00 A M Medical Record Number: [id number] Patient Account Number: [id number] Date of Birth/Sex: Treating RN: 08-23-1946 (76 y.o. F) Primary Care Provider: [name] Other Clinician: Referring Provider: Treating Provider/Extender: Hollie Beach in Treatment: 20 History of Present Illness HPI Description: Admission 05/25/2022 [name] is a 76 year old female with a past medical history of diet-controlled type 2 diabetes, chronic 3 stage kidney disease and venous insufficiency that presents to the clinic for a 1 month history of nonhealing ulcer to the left leg. She states that her dog scratched her causing a wound. She has developed cellulitis in this leg and has been treated with clindamycin by her primary care physician. She reports improvement in her symptoms. She currently denies signs of infection. She has been keeping the area covered. She does not wear compression stockings. She is on 80 mg of  Lasix twice daily. She has had reflux studies to the left leg on 07/2021 that notes venous reflux throughout the left common femoral vein and greater saphenous vein. She has not had an ablation. She follows with vein and vascular for her venous insufficiency. 8/14; patient presents for follow-up. She states that the wrap stayed on for 4 days and eventually slid down. She has been wearing her compression stocking and doing dressing changes with Hydrofera Blue since. She also states she has a hard time putting on her shoes with the 3 layer compression. She denies signs of infection. 8/21; patient presents for follow-up. She again had trouble with the wrap sliding down. We have been using Hydrofera Blue with gentamicin under 2 layer Coflex. She currently denies signs of infection. 8/28; patient presents for follow-up. She reports taking the wrap off yesterday. We have been using Hydrofera Blue with gentamicin under 2 layer Coflex. She states that the wrap does slide down but remains over the wound. She is currently wearing her compression stocking. She would like to do this instead of the compression wrap. 9/5; patient presents for follow-up. She has been using Medihoney to the left lower extremity remedy wound with her compression stockings. She reports no drainage from the site. Unfortunately she fell over the weekend and had to go to the ED due to lacerations she experienced on her left and right lower extremity. On the left leg she had 10 sutures placed on the right leg she had 7. She was given Keflex. Today she reports no signs of infection. She has been using Xeroform to the suture sites. 9/11; patient presents for follow-up. She has been using bacitracin ointment to the suture line. She currently denies signs of infection. 9/19; patient presents for follow-up. She has been using Medihoney to the wound beds. She denies signs of infection. 9/26; patient presents for follow-up. She has been  using  Dakin's wet-to-dry dressings to the wound beds. She reports improvement in wound healing. She has no issues or complaints today. [name], [name] ([id number]) [id number] Page 2 of 9 10/3; patient presents for follow-up. We had used compression therapy along with Hydrofera Blue to the right lower extremity. She states she felt uncomfortable with the wrap and took it off 3 days ago. She has been using Medihoney without issues to the left lower extremity wound. She denies signs of infection. 10/10; patient presents for follow-up. She has been using Hydrofera Blue and Medihoney to the left lower extremity wound. We have been using Hydrofera Blue and Santyl under compression therapy to the right lower extremity. She states that the wrap slid down 2 days ago and she took it off. She has no issues or complaints today. She denies signs of infection. 10/17; patient presents for follow-up. We have been using Hydrofera Blue to the left lower extremity wounds and Hydrofera Blue and Santyl under compression therapy to the right lower extremity. She has no issues or complaints today. She denies signs of infection. 10/23; patient presents for follow-up. We have been using silver alginate to the left lower extremity and Hydrofera Blue and Santyl under Kerlix/Coban to the right lower extremity. It is unclear which she is using to the left leg. She denies signs of infection. She has no issues or complaints today. 10/30; patient presents for follow-up. We have been using Hydrofera Blue and Santyl under Kerlix/Coban to the right lower extremity and silver alginate with Medihoney to the left lower extremity under compression stocking. She has no issues or complaints today. 11/13; patient presents for follow-up. We have been using Hydrofera Blue and Santyl under Kerlix/Coban to the right lower extremity. We have been using Medihoney and silver alginate to the left lower extremity wounds. This  area is healed. She has no issues or complaints today. 11/20; patient presents for follow-up. We have been using Hydrofera Blue and Santyl under Kerlix/Coban to the right lower extremity. She states that she took the wrap off yesterday Due to the drainage coming through from the wound bed. 11/28; patient presents for follow-up. She took the wrap off over the weekend. She does this often. He has no issues or complaints today. We have been using silver alginate with antibiotic ointment under compression therapy. 12/4; patient presents for follow-up. She took the wrap off over the weekend. We have been using silver alginate with antibiotic ointment under compression therapy. 12/12; patient presents for follow-up. She again took the wrap off over the weekend. She reports minimal drainage. We have been using silver alginate with antibiotic ointment under compression therapy. it is unclear if she is using a dressing when she takes the wrap off. She is using bandages to keep the area covered. 12/18; patient presents for follow-up. She again took the wrap off over the weekend. Despite this there is still is improvement in wound healing. She has no issues or complaints today. 12/26; patient presents for follow-up. She took the wrap off over the weekend. We have been using Hydrofera Blue with antibiotic ointment under the wrap. She has no issues or complaints today. There continues to be improvement in wound healing. Electronic Signature(s) Signed: 10/13/2022 3:18:07 PM By: Kalman Shan DO Entered By: Kalman Shan on 10/13/2022 11:19:50 -------------------------------------------------------------------------------- Physical Exam Details Patient Name: Date of Service: [name] RO [name]. 10/13/2022 10:00 A M Medical Record Number: [id number] Patient Account Number: [id number] Date of Birth/Sex: Treating RN: [birth year] (76  y.o. F) Primary Care Provider: [name] Other Clinician: Referring  Provider: Treating Provider/Extender: Hollie Beach in Treatment: 20 Constitutional respirations regular, non-labored and within target range for patient.. Cardiovascular 2+ dorsalis pedis/posterior tibialis pulses. Psychiatric pleasant and cooperative. Notes Right lower extremity: Granulation tissue with easily removed slough. 2+ pitting edema to the knee. No signs of infection including increased warmth, erythema or purulent drainage. Electronic Signature(s) Signed: 10/13/2022 3:18:07 PM By: Kalman Shan DO Entered By: Kalman Shan on 10/13/2022 11:20:48 [name] ([id number]) [id number] Page 3 of 9 -------------------------------------------------------------------------------- Physician Orders Details Patient Name: Date of Service: [name] RO [name]. 10/13/2022 10:00 A M Medical Record Number: [id number] Patient Account Number: [id number] Date of Birth/Sex: Treating RN: April 04, 1946 (76 y.o. [name] Primary Care Provider: [name] Other Clinician: Referring Provider: Treating Provider/Extender: Hollie Beach in Treatment: 20 Verbal / Phone Orders: No Diagnosis Coding Follow-up Appointments ppointment in 1 week. - w/ Dr. Heber [place] Return A ppointment in 2 weeks. - Dr. Heber [place] Return A Anesthetic (In clinic) Topical Lidocaine 5% applied to wound bed Edema Control - Lymphedema / SCD / Other Avoid standing for long periods of time. Exercise regularly Moisturize legs daily. - lotion both legs every night before bed. Compression stocking or Garment 20-30 mm/Hg pressure to: - apply in the morning and remove at night. Wound Treatment Wound #3 - Lower Leg Wound Laterality: Right, Lateral Cleanser: Soap and Water 1 x Per Week/30 Days Discharge Instructions: May shower and wash wound with dial antibacterial soap and water prior to dressing change. Peri-Wound Care: Sween Lotion  (Moisturizing lotion) 1 x Per Week/30 Days Discharge Instructions: Apply moisturizing lotion as directed Topical: Gentamicin 1 x Per Week/30 Days Discharge Instructions: As directed by physician Topical: Mupirocin Ointment 1 x Per Week/30 Days Discharge Instructions: Apply Mupirocin (Bactroban) as instructed Prim Dressing: Hydrofera Blue Ready Transfer Foam, 2.5x2.5 (in/in) 1 x Per Week/30 Days ary Discharge Instructions: Apply directly to wound bed as directed Secondary Dressing: Woven Gauze Sponges 2x2 in 1 x Per Week/30 Days Discharge Instructions: Apply over primary dressing as directed. Compression Wrap: Kerlix Roll 4.5x3.1 (in/yd) 1 x Per Week/30 Days Discharge Instructions: Apply Kerlix and Coban compression as directed. Compression Wrap: Coban Self-Adherent Wrap 4x5 (in/yd) 1 x Per Week/30 Days Discharge Instructions: Apply over Kerlix as directed. Electronic Signature(s) Signed: 10/13/2022 3:18:07 PM By: Kalman Shan DO Entered By: Kalman Shan on 10/13/2022 11:20:56 -------------------------------------------------------------------------------- Problem List Details Patient Name: Date of Service: [name], [place] RO [name]. 10/13/2022 10:00 A M Medical Record Number: [id number] Patient Account Number: [id number] Date of Birth/Sex: Treating RN: 10-01-1946 (76 y.o. 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/04/2022 No Yes L97.812 Non-pressure chronic ulcer of other part of right lower leg with fat layer 07/14/2022 No Yes exposed I87.313 Chronic venous hypertension (idiopathic) with ulcer of bilateral lower extremity  07/14/2022 No Yes E11.622 Type 2 diabetes mellitus with other skin ulcer 05/25/2022 No Yes T79.8XXA Other early complications of trauma, initial encounter 06/23/2022 No Yes S81.801A Unspecified open wound, right lower leg, initial encounter 06/23/2022 No Yes S81.802A Unspecified open wound, left lower leg, initial encounter 06/23/2022 No Yes Inactive Problems Resolved Problems ICD-10 Code Description Active Date Resolved  Date W54.8XXA Other contact with dog, initial encounter 05/25/2022 05/25/2022 Electronic Signature(s) Signed: 10/13/2022 3:18:07 PM By: Kalman Shan DO Entered By: Kalman Shan on 10/13/2022 11:18:40 -------------------------------------------------------------------------------- Progress Note Details Patient Name: Date of Service: [name], [place] RO [name]. 10/13/2022 10:00 A M Medical Record Number: [id number] Patient Account Number: [id number] Date of Birth/Sex: Treating RN: 09/22/1946 (76 y.o. F) Primary Care Provider: [name] Other Clinician: Referring Provider: Treating Provider/Extender: Hollie Beach in Treatment: 20 Subjective Chief Complaint Information obtained from Patient 05/25/2022; left lower extremity wound following a dog scratch 06/23/2022; lacerations to the right and left lower extremity status post fall [name], [name] ([id number]) [id number] Page 5 of 9 History of Present Illness (HPI) Admission 05/25/2022 Ms. Shivaun Bilello is a 76 year old female with a past medical history of diet-controlled type 2 diabetes, chronic 3 stage kidney disease and venous insufficiency that presents to the clinic for a 1 month history of nonhealing ulcer to the left leg. She states that her dog scratched her causing a wound. She has developed cellulitis in this leg and has been treated with clindamycin by her primary care physician. She reports improvement in her symptoms. She currently denies signs of infection. She has been  keeping the area covered. She does not wear compression stockings. She is on 80 mg of Lasix twice daily. She has had reflux studies to the left leg on 07/2021 that notes venous reflux throughout the left common femoral vein and greater saphenous vein. She has not had an ablation. She follows with vein and vascular for her venous insufficiency. 8/14; patient presents for follow-up. She states that the wrap stayed on for 4 days and eventually slid down. She has been wearing her compression stocking and doing dressing changes with Hydrofera Blue since. She also states she has a hard time putting on her shoes with the 3 layer compression. She denies signs of infection. 8/21; patient presents for follow-up. She again had trouble with the wrap sliding down. We have been using Hydrofera Blue with gentamicin under 2 layer Coflex. She currently denies signs of infection. 8/28; patient presents for follow-up. She reports taking the wrap off yesterday. We have been using Hydrofera Blue with gentamicin under 2 layer Coflex. She states that the wrap does slide down but remains over the wound. She is currently wearing her compression stocking. She would like to do this instead of the compression wrap. 9/5; patient presents for follow-up. She has been using Medihoney to the left lower extremity remedy wound with her compression stockings. She reports no drainage from the site. Unfortunately she fell over the weekend and had to go to the ED due to lacerations she experienced on her left and right lower extremity. On the left leg she had 10 sutures placed on the right leg she had 7. She was given Keflex. Today she reports no signs of infection. She has been using Xeroform to the suture sites. 9/11; patient presents for follow-up. She has been using bacitracin ointment to the suture line. She currently denies signs of infection. 9/19; patient presents for follow-up. She has been using Medihoney to the wound beds. She  denies signs of infection. 9/26; patient presents for follow-up. She has been using [employer]'s wet-to-dry dressings to the wound beds. She reports improvement in wound healing. She has no issues or complaints today. 10/3; patient presents for follow-up. We had used compression therapy along with Hydrofera Blue to the right lower extremity. She states she felt uncomfortable with the  wrap and took it off 3 days ago. She has been using Medihoney without issues to the left lower extremity wound. She denies signs of infection. 10/10; patient presents for follow-up. She has been using Hydrofera Blue and Medihoney to the left lower extremity wound. We have been using Hydrofera Blue and Santyl under compression therapy to the right lower extremity. She states that the wrap slid down 2 days ago and she took it off. She has no issues or complaints today. She denies signs of infection. 10/17; patient presents for follow-up. We have been using Hydrofera Blue to the left lower extremity wounds and Hydrofera Blue and Santyl under compression therapy to the right lower extremity. She has no issues or complaints today. She denies signs of infection. 10/23; patient presents for follow-up. We have been using silver alginate to the left lower extremity and Hydrofera Blue and Santyl under Kerlix/Coban to the right lower extremity. It is unclear which she is using to the left leg. She denies signs of infection. She has no issues or complaints today. 10/30; patient presents for follow-up. We have been using Hydrofera Blue and Santyl under Kerlix/Coban to the right lower extremity and silver alginate with Medihoney to the left lower extremity under compression stocking. She has no issues or complaints today. 11/13; patient presents for follow-up. We have been using Hydrofera Blue and Santyl under Kerlix/Coban to the right lower extremity. We have been using Medihoney and silver alginate to the left lower extremity wounds.  This area is healed. She has no issues or complaints today. 11/20; patient presents for follow-up. We have been using Hydrofera Blue and Santyl under Kerlix/Coban to the right lower extremity. She states that she took the wrap off yesterday Due to the drainage coming through from the wound bed. 11/28; patient presents for follow-up. She took the wrap off over the weekend. She does this often. He has no issues or complaints today. We have been using silver alginate with antibiotic ointment under compression therapy. 12/4; patient presents for follow-up. She took the wrap off over the weekend. We have been using silver alginate with antibiotic ointment under compression therapy. 12/12; patient presents for follow-up. She again took the wrap off over the weekend. She reports minimal drainage. We have been using silver alginate with antibiotic ointment under compression therapy. it is unclear if she is using a dressing when she takes the wrap off. She is using bandages to keep the area covered. 12/18; patient presents for follow-up. She again took the wrap off over the weekend. Despite this there is still is improvement in wound healing. She has no issues or complaints today. 12/26; patient presents for follow-up. She took the wrap off over the weekend. We have been using Hydrofera Blue with antibiotic ointment under the wrap. She has no issues or complaints today. There continues to be improvement in wound healing. Patient History Information obtained from Patient. Family History Cancer - Siblings, Diabetes - Siblings, Heart Disease - Mother, Hypertension - Mother, No family history of Hereditary Spherocytosis, Kidney Disease, Lung Disease, Seizures, Stroke, Thyroid Problems, Tuberculosis. Social History Never smoker, Marital Status - Married, Alcohol Use - Never, Drug Use - No History, Caffeine Use - Moderate. Medical History Eyes Patient has history of Cataracts Denies history of  Glaucoma Ear/Nose/Mouth/Throat Denies history of Chronic sinus problems/congestion, Middle ear problems Hematologic/Lymphatic Patient has history of Anemia [name], [name] ([id number]) [id number] Page 6 of 9 Denies history of Hemophilia, Human Immunodeficiency Virus, Lymphedema, Sickle Cell Disease Respiratory Denies  history of Aspiration, Asthma, Chronic Obstructive Pulmonary Disease (COPD), Pneumothorax, Sleep Apnea, Tuberculosis Cardiovascular Patient has history of Congestive Heart Failure, Hypertension Denies history of Angina, Arrhythmia, Coronary Artery Disease, Deep Vein Thrombosis, Hypotension, Myocardial Infarction, Peripheral Arterial Disease, Peripheral Venous Disease, Phlebitis, Vasculitis Gastrointestinal Denies history of Cirrhosis , Colitis, Crohnoos, Hepatitis A, Hepatitis [name], Hepatitis C Endocrine Denies history of Type I Diabetes, Type II Diabetes Genitourinary Denies history of End Stage Renal Disease Immunological Denies history of Lupus Erythematosus, Raynaudoos, Scleroderma Integumentary (Skin) Denies history of History of Burn Musculoskeletal Patient has history of Osteoarthritis Denies history of Gout, Rheumatoid Arthritis, Osteomyelitis Neurologic Denies history of Dementia, Neuropathy, Quadriplegia, Paraplegia, Seizure Disorder Oncologic Denies history of Received Chemotherapy Psychiatric Denies history of Anorexia/bulimia, Confinement Anxiety Hospitalization/Surgery History - right total knee replacement. Medical A Surgical History Notes nd Gastrointestinal diverticulosis hiatal hernia hyperlipidemia Musculoskeletal DJD Objective Constitutional respirations regular, non-labored and within target range for patient.. Vitals Time Taken: 10:21 AM, Height: 63 in, Temperature: 97.5 F, Pulse: 71 bpm, Respiratory Rate: 20 breaths/min, Blood Pressure: 128/80 mmHg. Cardiovascular 2+ dorsalis pedis/posterior tibialis  pulses. Psychiatric pleasant and cooperative. General Notes: Right lower extremity: Granulation tissue with easily removed slough. 2+ pitting edema to the knee. No signs of infection including increased warmth, erythema or purulent drainage. Integumentary (Hair, Skin) Wound #3 status is Open. Original cause of wound was Trauma. The date acquired was: 06/20/2022. The wound has been in treatment 16 weeks. The wound is located on the Right,Lateral Lower Leg. The wound measures 0.7cm length x 0.7cm width x 0.1cm depth; 0.385cm^2 area and 0.038cm^3 volume. There is Fat Layer (Subcutaneous Tissue) exposed. There is no tunneling or undermining noted. There is a medium amount of serosanguineous drainage noted. The wound margin is distinct with the outline attached to the wound base. There is large (67-100%) red, pink granulation within the wound bed. There is a small (1-33%) amount of necrotic tissue within the wound bed including Adherent Slough. The periwound skin appearance had no abnormalities noted for texture. The periwound skin appearance had no abnormalities noted for moisture. The periwound skin appearance exhibited: Hemosiderin Staining. The periwound skin appearance did not exhibit: Atrophie Blanche, Cyanosis, Ecchymosis, Mottled, Pallor, Rubor, Erythema. Periwound temperature was noted as No Abnormality. Assessment Active Problems ICD-10 Non-pressure chronic ulcer of other part of left lower leg with fat layer exposed Non-pressure chronic ulcer of other part of right lower leg with fat layer exposed Chronic venous hypertension (idiopathic) with ulcer of bilateral lower extremity Type 2 diabetes mellitus with other skin ulcer Other early complications of trauma, initial encounter Unspecified open wound, right lower leg, initial encounter Unspecified open wound, left lower leg, initial encounter [name], [name] ([id number]) [phone].pdf Page 7 of 9 Patient's wound  has shown improvement in size and appearance since last clinic visit. Mechanical debridement with gauze easily removed the slough present. Recommended continuing the course with Hydrofera Blue and antibiotic ointment under compression therapy. Follow-up in 1 week. Plan Follow-up Appointments: Return Appointment in 1 week. - w/ Dr. Heber [place] Return Appointment in 2 weeks. - Dr. Heber [place] Anesthetic: (In clinic) Topical Lidocaine 5% applied to wound bed Edema Control - Lymphedema / SCD / Other: Avoid standing for long periods of time. Exercise regularly Moisturize legs daily. - lotion both legs every night before bed. Compression stocking or Garment 20-30 mm/Hg pressure to: - apply in the morning and remove at night. WOUND #3: - Lower Leg Wound Laterality: Right, Lateral Cleanser: Soap and Water 1 x Per Week/30 Days Discharge  Instructions: May shower and wash wound with dial antibacterial soap and water prior to dressing change. Peri-Wound Care: Sween Lotion (Moisturizing lotion) 1 x Per Week/30 Days Discharge Instructions: Apply moisturizing lotion as directed Topical: Gentamicin 1 x Per Week/30 Days Discharge Instructions: As directed by physician Topical: Mupirocin Ointment 1 x Per Week/30 Days Discharge Instructions: Apply Mupirocin (Bactroban) as instructed Prim Dressing: Hydrofera Blue Ready Transfer Foam, 2.5x2.5 (in/in) 1 x Per Week/30 Days ary Discharge Instructions: Apply directly to wound bed as directed Secondary Dressing: Woven Gauze Sponges 2x2 in 1 x Per Week/30 Days Discharge Instructions: Apply over primary dressing as directed. Com pression Wrap: Kerlix Roll 4.5x3.1 (in/yd) 1 x Per Week/30 Days Discharge Instructions: Apply Kerlix and Coban compression as directed. Com pression Wrap: Coban Self-Adherent Wrap 4x5 (in/yd) 1 x Per Week/30 Days Discharge Instructions: Apply over Kerlix as directed. 1. Hydrofera Blue with antibiotic ointment under Kerlix/Cobanooright lower  extremity 2. Follow-up in 1 week Electronic Signature(s) Signed: 10/13/2022 3:18:07 PM By: Kalman Shan DO Entered By: Kalman Shan on 10/13/2022 11:22:23 -------------------------------------------------------------------------------- HxROS Details Patient Name: Date of Service: [name], [place] RO [name]. 10/13/2022 10:00 A M Medical Record Number: [id number] Patient Account Number: [id number] Date of Birth/Sex: Treating RN: 03/22/1946 (76 y.o. F) Primary Care Provider: [name] Other Clinician: Referring Provider: Treating Provider/Extender: Hollie Beach in Treatment: 20 Information Obtained From Patient Eyes Medical History: Positive for: Cataracts Negative for: Glaucoma Ear/Nose/Mouth/Throat Medical History: Negative for: Chronic sinus problems/congestion; Middle ear problems Hematologic/Lymphatic Medical History: Positive for: Anemia [name], [name] ([id number]) [phone].pdf Page 8 of 9 Negative for: Hemophilia; Human Immunodeficiency Virus; Lymphedema; Sickle Cell Disease Respiratory Medical History: Negative for: Aspiration; Asthma; Chronic Obstructive Pulmonary Disease (COPD); Pneumothorax; Sleep Apnea; Tuberculosis Cardiovascular Medical History: Positive for: Congestive Heart Failure; Hypertension Negative for: Angina; Arrhythmia; Coronary Artery Disease; Deep Vein Thrombosis; Hypotension; Myocardial Infarction; Peripheral Arterial Disease; Peripheral Venous Disease; Phlebitis; Vasculitis Gastrointestinal Medical History: Negative for: Cirrhosis ; Colitis; Crohns; Hepatitis A; Hepatitis [name]; Hepatitis C Past Medical History Notes: diverticulosis hiatal hernia hyperlipidemia Endocrine Medical History: Negative for: Type I Diabetes; Type II Diabetes Genitourinary Medical History: Negative for: End Stage Renal Disease Immunological Medical History: Negative for: Lupus Erythematosus; Raynauds;  Scleroderma Integumentary (Skin) Medical History: Negative for: History of Burn Musculoskeletal Medical History: Positive for: Osteoarthritis Negative for: Gout; Rheumatoid Arthritis; Osteomyelitis Past Medical History Notes: DJD Neurologic Medical History: Negative for: Dementia; Neuropathy; Quadriplegia; Paraplegia; Seizure Disorder Oncologic Medical History: Negative for: Received Chemotherapy Psychiatric Medical History: Negative for: Anorexia/bulimia; Confinement Anxiety HBO Extended History Items Eyes: Cataracts Immunizations Pneumococcal Vaccine: Received Pneumococcal Vaccination: Yes Received Pneumococcal Vaccination On or After 60th Birthday: Yes Implantable Devices None [name], [name] ([id number]) [id number] Page 9 of 9 Hospitalization / Surgery History Type of Hospitalization/Surgery right total knee replacement Family and Social History Cancer: Yes - Siblings; Diabetes: Yes - Siblings; Heart Disease: Yes - Mother; Hereditary Spherocytosis: No; Hypertension: Yes - Mother; Kidney Disease: No; Lung Disease: No; Seizures: No; Stroke: No; Thyroid Problems: No; Tuberculosis: No; Never smoker; Marital Status - Married; Alcohol Use: Never; Drug Use: No History; Caffeine Use: Moderate; Financial Concerns: No; Food, Clothing or Shelter Needs: No; Support System Lacking: No; Transportation Concerns: No Electronic Signature(s) Signed: 10/13/2022 3:18:07 PM By: Kalman Shan DO Entered By: Kalman Shan on 10/13/2022 11:19:57 -------------------------------------------------------------------------------- SuperBill Details Patient Name: Date of Service: [name], [place] RO [name]. 10/13/2022 Medical Record Number: [id number] Patient Account Number: [id number] Date of Birth/Sex: Treating RN: 09/22/1946 (76 y.o. 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.812 Non-pressure chronic ulcer of other part of right lower leg with fat layer exposed I87.313 Chronic venous hypertension (idiopathic) with ulcer of bilateral lower extremity E11.622 Type 2 diabetes mellitus with other skin ulcer T79.8XXA Other early complications of trauma, initial encounter S81.801A Unspecified open wound, right lower leg, initial encounter S81.802A Unspecified open wound, left lower leg, initial encounter Facility Procedures : CPT4 Code: 06004599 Description: [facility] VISIT-LEV 3 EST PT Modifier: Quantity: 1 Physician Procedures : CPT4 Code Description Modifier [id number] - WC PHYS LEVEL 3 - EST PT ICD-10 Diagnosis Description L97.822 Non-pressure chronic ulcer of other part of left lower leg with fat layer exposed L97.812 Non-pressure chronic ulcer of other part of right  lower leg with fat layer exposed I87.313 Chronic venous hypertension (idiopathic) with ulcer of bilateral lower extremity E11.622 Type 2 diabetes mellitus with other skin ulcer Quantity: 1 Electronic Signature(s) Signed: 10/13/2022 3:18:07 PM By: Kalman Shan DO Signed: 10/13/2022 5:26:14 PM By: [name] RN, BSN Entered By: [name] on 10/13/2022 14:11:26

## 2022-10-20 ENCOUNTER — Encounter (HOSPITAL_BASED_OUTPATIENT_CLINIC_OR_DEPARTMENT_OTHER): Payer: Medicare Other | Attending: Internal Medicine | Admitting: Internal Medicine

## 2022-10-20 DIAGNOSIS — X58XXXA Exposure to other specified factors, initial encounter: Secondary | ICD-10-CM | POA: Diagnosis not present

## 2022-10-20 DIAGNOSIS — S81801A Unspecified open wound, right lower leg, initial encounter: Secondary | ICD-10-CM | POA: Diagnosis not present

## 2022-10-20 DIAGNOSIS — E1122 Type 2 diabetes mellitus with diabetic chronic kidney disease: Secondary | ICD-10-CM | POA: Insufficient documentation

## 2022-10-20 DIAGNOSIS — L97812 Non-pressure chronic ulcer of other part of right lower leg with fat layer exposed: Secondary | ICD-10-CM

## 2022-10-20 DIAGNOSIS — I87311 Chronic venous hypertension (idiopathic) with ulcer of right lower extremity: Secondary | ICD-10-CM | POA: Diagnosis not present

## 2022-10-20 DIAGNOSIS — T798XXA Other early complications of trauma, initial encounter: Secondary | ICD-10-CM | POA: Insufficient documentation

## 2022-10-20 DIAGNOSIS — L97822 Non-pressure chronic ulcer of other part of left lower leg with fat layer exposed: Secondary | ICD-10-CM | POA: Insufficient documentation

## 2022-10-20 DIAGNOSIS — I87313 Chronic venous hypertension (idiopathic) with ulcer of bilateral lower extremity: Secondary | ICD-10-CM | POA: Diagnosis not present

## 2022-10-20 DIAGNOSIS — Z79899 Other long term (current) drug therapy: Secondary | ICD-10-CM | POA: Insufficient documentation

## 2022-10-20 DIAGNOSIS — N183 Chronic kidney disease, stage 3 unspecified: Secondary | ICD-10-CM | POA: Diagnosis not present

## 2022-10-20 DIAGNOSIS — E11622 Type 2 diabetes mellitus with other skin ulcer: Secondary | ICD-10-CM | POA: Diagnosis not present

## 2022-10-20 DIAGNOSIS — S81802A Unspecified open wound, left lower leg, initial encounter: Secondary | ICD-10-CM | POA: Diagnosis not present

## 2022-10-20 NOTE — Progress Notes (Signed)
MEGGIE, LASETER ([id number]) [id number] Page 1 of 10 Visit Report for 10/20/2022 Chief Complaint Document Details Patient Name: Date of Service: [name] [name]. 10/20/2022 9:30 A M Medical Record Number: [id number] Patient Account Number: [id number] Date of Birth/Sex: Treating RN: [birth year] (77 y.o. F) Primary Care Provider: [name] Other Clinician: Referring Provider: Treating Provider/Extender: Hollie Beach in Treatment: 21 Information Obtained from: Patient Chief Complaint 05/25/2022; left lower extremity wound following a dog scratch 06/23/2022; lacerations to the right and left lower extremity status post fall Electronic Signature(s) Signed: 10/20/2022 12:20:08 PM By: Kalman Shan DO Entered By: Kalman Shan on 10/20/2022 10:49:52 -------------------------------------------------------------------------------- HPI Details Patient Name: Date of Service: [name], Brandy RO [name]. 10/20/2022 9:30 A M Medical Record Number: [id number] Patient Account Number: [id number] Date of Birth/Sex: Treating RN: Jan 19, 1946 (77 y.o. F) Primary Care Provider: [name] Other Clinician: Referring Provider: Treating Provider/Extender: Hollie Beach in Treatment: 21 History of Present Illness HPI Description: Admission 05/25/2022 [name] is a 77 year old female with a past medical history of diet-controlled type 2 diabetes, chronic 3 stage kidney disease and venous insufficiency that presents to the clinic for a 1 month history of nonhealing ulcer to the left leg. She states that her dog scratched her causing a wound. She has developed cellulitis in this leg and has been treated with clindamycin by her primary care physician. She reports improvement in her symptoms. She currently denies signs of infection. She has been keeping the area covered. She does not wear compression stockings. She is on 80 mg of Lasix  twice daily. She has had reflux studies to the left leg on 07/2021 that notes venous reflux throughout the left common femoral vein and greater saphenous vein. She has not had an ablation. She follows with vein and vascular for her venous insufficiency. 8/14; patient presents for follow-up. She states that the wrap stayed on for 4 days and eventually slid down. She has been wearing her compression stocking and doing dressing changes with Hydrofera Blue since. She also states she has a hard time putting on her shoes with the 3 layer compression. She denies signs of infection. 8/21; patient presents for follow-up. She again had trouble with the wrap sliding down. We have been using Hydrofera Blue with gentamicin under 2 layer Coflex. She currently denies signs of infection. 8/28; patient presents for follow-up. She reports taking the wrap off yesterday. We have been using Hydrofera Blue with gentamicin under 2 layer Coflex. She states that the wrap does slide down but remains over the wound. She is currently wearing her compression stocking. She would like to do this instead of the compression wrap. 9/5; patient presents for follow-up. She has been using Medihoney to the left lower extremity remedy wound with her compression stockings. She reports no drainage from the site. Unfortunately she fell over the weekend and had to go to the ED due to lacerations she experienced on her left and right lower extremity. On the left leg she had 10 sutures placed on the right leg she had 7. She was given Keflex. Today she reports no signs of infection. She has been using Xeroform to the suture sites. 9/11; patient presents for follow-up. She has been using bacitracin ointment to the suture line. She currently denies signs of infection. 9/19; patient presents for follow-up. She has been using Medihoney to the wound beds. She denies signs of infection. 9/26; patient presents for follow-up. She has been  using Dakin's  wet-to-dry dressings to the wound beds. She reports improvement in wound healing. She has no issues or complaints today. [name], [name] ([id number]) [id number] Page 2 of 10 10/3; patient presents for follow-up. We had used compression therapy along with Hydrofera Blue to the right lower extremity. She states she felt uncomfortable with the wrap and took it off 3 days ago. She has been using Medihoney without issues to the left lower extremity wound. She denies signs of infection. 10/10; patient presents for follow-up. She has been using Hydrofera Blue and Medihoney to the left lower extremity wound. We have been using Hydrofera Blue and Santyl under compression therapy to the right lower extremity. She states that the wrap slid down 2 days ago and she took it off. She has no issues or complaints today. She denies signs of infection. 10/17; patient presents for follow-up. We have been using Hydrofera Blue to the left lower extremity wounds and Hydrofera Blue and Santyl under compression therapy to the right lower extremity. She has no issues or complaints today. She denies signs of infection. 10/23; patient presents for follow-up. We have been using silver alginate to the left lower extremity and Hydrofera Blue and Santyl under Kerlix/Coban to the right lower extremity. It is unclear which she is using to the left leg. She denies signs of infection. She has no issues or complaints today. 10/30; patient presents for follow-up. We have been using Hydrofera Blue and Santyl under Kerlix/Coban to the right lower extremity and silver alginate with Medihoney to the left lower extremity under compression stocking. She has no issues or complaints today. 11/13; patient presents for follow-up. We have been using Hydrofera Blue and Santyl under Kerlix/Coban to the right lower extremity. We have been using Medihoney and silver alginate to the left lower extremity wounds. This area is  healed. She has no issues or complaints today. 11/20; patient presents for follow-up. We have been using Hydrofera Blue and Santyl under Kerlix/Coban to the right lower extremity. She states that she took the wrap off yesterday Due to the drainage coming through from the wound bed. 11/28; patient presents for follow-up. She took the wrap off over the weekend. She does this often. He has no issues or complaints today. We have been using silver alginate with antibiotic ointment under compression therapy. 12/4; patient presents for follow-up. She took the wrap off over the weekend. We have been using silver alginate with antibiotic ointment under compression therapy. 12/12; patient presents for follow-up. She again took the wrap off over the weekend. She reports minimal drainage. We have been using silver alginate with antibiotic ointment under compression therapy. it is unclear if she is using a dressing when she takes the wrap off. She is using bandages to keep the area covered. 12/18; patient presents for follow-up. She again took the wrap off over the weekend. Despite this there is still is improvement in wound healing. She has no issues or complaints today. 12/26; patient presents for follow-up. She took the wrap off over the weekend. We have been using Hydrofera Blue with antibiotic ointment under the wrap. She has no issues or complaints today. There continues to be improvement in wound healing. 1/2; patient presents for follow-up. She took the wrap off over the weekend. She states she has been using Hydrofera Blue. We have been using Hydrofera Blue with antibiotic ointment under Kerlix/Coban. She continues to improve and her wound healing. She has no issues or complaints today. Electronic Signature(s) Signed: 10/20/2022  12:20:08 PM By: Kalman Shan DO Entered By: Kalman Shan on 10/20/2022  10:50:28 -------------------------------------------------------------------------------- Physical Exam Details Patient Name: Date of Service: [name] RO [name]. 10/20/2022 9:30 A M Medical Record Number: [id number] Patient Account Number: [id number] Date of Birth/Sex: Treating RN: 14-Aug-1946 (77 y.o. F) Primary Care Provider: [name] Other Clinician: Referring Provider: Treating Provider/Extender: Hollie Beach in Treatment: 21 Constitutional respirations regular, non-labored and within target range for patient.. Cardiovascular 2+ dorsalis pedis/posterior tibialis pulses. Psychiatric pleasant and cooperative. Notes Right lower extremity: Open wound with granulation tissue present. 2+ pitting edema to the knee. Electronic Signature(s) Signed: 10/20/2022 12:20:08 PM By: Kalman Shan DO Entered By: Kalman Shan on 10/20/2022 10:51:11 Thomasene Mohair ([id number]) [id number] Page 3 of 10 -------------------------------------------------------------------------------- Physician Orders Details Patient Name: Date of Service: [name] [name]. 10/20/2022 9:30 A M Medical Record Number: [id number] Patient Account Number: [id number] Date of Birth/Sex: Treating RN: April 04, 1946 (77 y.o. [name], [name] Primary Care Provider: [name] Other Clinician: Referring Provider: Treating Provider/Extender: Hollie Beach in Treatment: 21 Verbal / Phone Orders: No Diagnosis Coding ICD-10 Coding Code Description [phone] Non-pressure chronic ulcer of other part of left lower leg with fat layer exposed L97.812 Non-pressure chronic ulcer of other part of right lower leg with fat layer exposed I87.313 Chronic venous hypertension (idiopathic) with ulcer of bilateral lower extremity E11.622 Type 2 diabetes mellitus with other skin ulcer T79.8XXA Other early complications of trauma, initial encounter S81.801A  Unspecified open wound, right lower leg, initial encounter S81.802A Unspecified open wound, left lower leg, initial encounter Follow-up Appointments ppointment in 1 week. - w/ Dr. Heber [place] Return A ppointment in 2 weeks. - Dr. Heber [place] Return A Anesthetic (In clinic) Topical Lidocaine 5% applied to wound bed Bathing/ Shower/ Hygiene May shower and wash wound with soap and water. Edema Control - Lymphedema / SCD / Other Elevate legs to the level of the heart or above for 30 minutes daily and/or when sitting for 3-4 times a day throughout the day. Avoid standing for long periods of time. Exercise regularly Moisturize legs daily. - lotion both legs every night before bed. Compression stocking or Garment 20-30 mm/Hg pressure to: - apply in the morning and remove at night. Wound Treatment Wound #3 - Lower Leg Wound Laterality: Right, Lateral Cleanser: Soap and Water 1 x Per Day/30 Days Discharge Instructions: May shower and wash wound with dial antibacterial soap and water prior to dressing change. Peri-Wound Care: Sween Lotion (Moisturizing lotion) 1 x Per Day/30 Days Discharge Instructions: Apply moisturizing lotion as directed Prim Dressing: Hydrofera Blue Ready Transfer Foam, 2.5x2.5 (in/in) 1 x Per Day/30 Days ary Discharge Instructions: Apply directly to wound bed as directed Prim Dressing: MediHoney Gel, tube 1.5 (oz) 1 x Per Day/30 Days ary Discharge Instructions: Apply to wound bed as instructed Secondary Dressing: Zetuvit Plus Silicone Border Dressing 4x4 (in/in) 1 x Per Day/30 Days Discharge Instructions: Apply silicone border over primary dressing as directed. Compression Wrap: 20-55mHg compression stocking 1 x Per Day/30 Days Discharge Instructions: apply in the morning and remove at night. Electronic Signature(s) Signed: 10/20/2022 12:20:08 PM By: HKalman ShanDO Entered By: HKalman Shanon 10/20/2022 10:54:06 WThomasene Mohair([id number]  [id number] Page 4 of 10 -------------------------------------------------------------------------------- Problem List Details Patient Name: Date of Service: WNicholaus CorollaLYN [name]. 10/20/2022 9:30 A M Medical Record Number: [id number] Account Number: [id number] of Birth/Sex: Treating RN: [birth year](77y.o. FDebby BudPrimary Care  Provider: [name] Other Clinician: Referring Provider: Treating Provider/Extender: Hollie Beach in Treatment: 21 Active Problems ICD-10 Encounter Code Description Active Date MDM Diagnosis [phone] Non-pressure chronic ulcer of other part of right lower leg with fat layer 07/14/2022 No Yes exposed I87.311 Chronic venous hypertension (idiopathic) with ulcer of right lower extremity 10/20/2022 No Yes E11.622 Type 2 diabetes mellitus with other skin ulcer 05/25/2022 No Yes T79.8XXA Other early complications of trauma, initial encounter 06/23/2022 No Yes S81.801A Unspecified open wound, right lower leg, initial encounter 06/23/2022 No Yes S81.802A Unspecified open wound, left lower leg, initial encounter 06/23/2022 No Yes Inactive Problems Resolved Problems ICD-10 Code Description Active Date Resolved Date W54.8XXA Other contact with dog, initial encounter 05/25/2022 05/25/2022 I87.313 Chronic venous hypertension (idiopathic) with ulcer of bilateral lower extremity 07/14/2022 07/14/2022 E83.151 Non-pressure chronic ulcer of other part of left lower leg with fat layer exposed 05/25/2022 05/25/2022 Electronic Signature(s) Signed: 10/20/2022 12:20:08 PM By: Kalman Shan DO Entered By: Kalman Shan on 10/20/2022 11:12:51 Thomasene Mohair ([id number]) [id number] Page 5 of 10 -------------------------------------------------------------------------------- Progress Note Details Patient Name: Date of Service: [name] [name]. 10/20/2022 9:30 A M Medical Record Number:  [id number] Patient Account Number: [id number] Date of Birth/Sex: Treating RN: [birth year] (77 y.o. F) Primary Care Provider: [name] Other Clinician: Referring Provider: Treating Provider/Extender: Hollie Beach in Treatment: 21 Subjective Chief Complaint Information obtained from Patient 05/25/2022; left lower extremity wound following a dog scratch 06/23/2022; lacerations to the right and left lower extremity status post fall History of Present Illness (HPI) Admission 05/25/2022 [name] is a 77 year old female with a past medical history of diet-controlled type 2 diabetes, chronic 3 stage kidney disease and venous insufficiency that presents to the clinic for a 1 month history of nonhealing ulcer to the left leg. She states that her dog scratched her causing a wound. She has developed cellulitis in this leg and has been treated with clindamycin by her primary care physician. She reports improvement in her symptoms. She currently denies signs of infection. She has been keeping the area covered. She does not wear compression stockings. She is on 80 mg of Lasix twice daily. She has had reflux studies to the left leg on 07/2021 that notes venous reflux throughout the left common femoral vein and greater saphenous vein. She has not had an ablation. She follows with vein and vascular for her venous insufficiency. 8/14; patient presents for follow-up. She states that the wrap stayed on for 4 days and eventually slid down. She has been wearing her compression stocking and doing dressing changes with Hydrofera Blue since. She also states she has a hard time putting on her shoes with the 3 layer compression. She denies signs of infection. 8/21; patient presents for follow-up. She again had trouble with the wrap sliding down. We have been using Hydrofera Blue with gentamicin under 2 layer Coflex. She currently denies signs of infection. 8/28; patient presents for follow-up.  She reports taking the wrap off yesterday. We have been using Hydrofera Blue with gentamicin under 2 layer Coflex. She states that the wrap does slide down but remains over the wound. She is currently wearing her compression stocking. She would like to do this instead of the compression wrap. 9/5; patient presents for follow-up. She has been using Medihoney to the left lower extremity remedy wound with her compression stockings. She reports no drainage from the site. Unfortunately she fell over the weekend and had to go  to the ED due to lacerations she experienced on her left and right lower extremity. On the left leg she had 10 sutures placed on the right leg she had 7. She was given Keflex. Today she reports no signs of infection. She has been using Xeroform to the suture sites. 9/11; patient presents for follow-up. She has been using bacitracin ointment to the suture line. She currently denies signs of infection. 9/19; patient presents for follow-up. She has been using Medihoney to the wound beds. She denies signs of infection. 9/26; patient presents for follow-up. She has been using Dakin's wet-to-dry dressings to the wound beds. She reports improvement in wound healing. She has no issues or complaints today. 10/3; patient presents for follow-up. We had used compression therapy along with Hydrofera Blue to the right lower extremity. She states she felt uncomfortable with the wrap and took it off 3 days ago. She has been using Medihoney without issues to the left lower extremity wound. She denies signs of infection. 10/10; patient presents for follow-up. She has been using Hydrofera Blue and Medihoney to the left lower extremity wound. We have been using Hydrofera Blue and Santyl under compression therapy to the right lower extremity. She states that the wrap slid down 2 days ago and she took it off. She has no issues or complaints today. She denies signs of infection. 10/17; patient presents for  follow-up. We have been using Hydrofera Blue to the left lower extremity wounds and Hydrofera Blue and Santyl under compression therapy to the right lower extremity. She has no issues or complaints today. She denies signs of infection. 10/23; patient presents for follow-up. We have been using silver alginate to the left lower extremity and Hydrofera Blue and Santyl under Kerlix/Coban to the right lower extremity. It is unclear which she is using to the left leg. She denies signs of infection. She has no issues or complaints today. 10/30; patient presents for follow-up. We have been using Hydrofera Blue and Santyl under Kerlix/Coban to the right lower extremity and silver alginate with Medihoney to the left lower extremity under compression stocking. She has no issues or complaints today. 11/13; patient presents for follow-up. We have been using Hydrofera Blue and Santyl under Kerlix/Coban to the right lower extremity. We have been using Medihoney and silver alginate to the left lower extremity wounds. This area is healed. She has no issues or complaints today. 11/20; patient presents for follow-up. We have been using Hydrofera Blue and Santyl under Kerlix/Coban to the right lower extremity. She states that she took the wrap off yesterday Due to the drainage coming through from the wound bed. 11/28; patient presents for follow-up. She took the wrap off over the weekend. She does this often. He has no issues or complaints today. We have been using silver alginate with antibiotic ointment under compression therapy. 12/4; patient presents for follow-up. She took the wrap off over the weekend. We have been using silver alginate with antibiotic ointment under compression therapy. 12/12; patient presents for follow-up. She again took the wrap off over the weekend. She reports minimal drainage. We have been using silver alginate with antibiotic ointment under compression therapy. it is unclear if she is using  a dressing when she takes the wrap off. She is using bandages to keep the area covered. 12/18; patient presents for follow-up. She again took the wrap off over the weekend. Despite this there is still is improvement in wound healing. She has no issues or complaints today. 12/26;  patient presents for follow-up. She took the wrap off over the weekend. We have been using Hydrofera Blue with antibiotic ointment under the wrap. She has no issues or complaints today. There continues to be improvement in wound healing. 1/2; patient presents for follow-up. She took the wrap off over the weekend. She states she has been using Hydrofera Blue. We have been using Hydrofera Blue with antibiotic ointment under Kerlix/Coban. She continues to improve and her wound healing. She has no issues or complaints today. [name], [name] ([id number]) [id number] Page 6 of 10 Patient History Information obtained from Patient. Family History Cancer - Siblings, Diabetes - Siblings, Heart Disease - Mother, Hypertension - Mother, No family history of Hereditary Spherocytosis, Kidney Disease, Lung Disease, Seizures, Stroke, Thyroid Problems, Tuberculosis. Social History Never smoker, Marital Status - Married, Alcohol Use - Never, Drug Use - No History, Caffeine Use - Moderate. Medical History Eyes Patient has history of Cataracts Denies history of Glaucoma Ear/Nose/Mouth/Throat Denies history of Chronic sinus problems/congestion, Middle ear problems Hematologic/Lymphatic Patient has history of Anemia Denies history of Hemophilia, Human Immunodeficiency Virus, Lymphedema, Sickle Cell Disease Respiratory Denies history of Aspiration, Asthma, Chronic Obstructive Pulmonary Disease (COPD), Pneumothorax, Sleep Apnea, Tuberculosis Cardiovascular Patient has history of Congestive Heart Failure, Hypertension Denies history of Angina, Arrhythmia, Coronary Artery Disease, Deep Vein Thrombosis,  Hypotension, Myocardial Infarction, Peripheral Arterial Disease, Peripheral Venous Disease, Phlebitis, Vasculitis Gastrointestinal Denies history of Cirrhosis , Colitis, Crohnoos, Hepatitis A, Hepatitis [name], Hepatitis C Endocrine Denies history of Type I Diabetes, Type II Diabetes Genitourinary Denies history of End Stage Renal Disease Immunological Denies history of Lupus Erythematosus, Raynaudoos, Scleroderma Integumentary (Skin) Denies history of History of Burn Musculoskeletal Patient has history of Osteoarthritis Denies history of Gout, Rheumatoid Arthritis, Osteomyelitis Neurologic Denies history of Dementia, Neuropathy, Quadriplegia, Paraplegia, Seizure Disorder Oncologic Denies history of Received Chemotherapy Psychiatric Denies history of Anorexia/bulimia, Confinement Anxiety Hospitalization/Surgery History - right total knee replacement. Medical A Surgical History Notes nd Gastrointestinal diverticulosis hiatal hernia hyperlipidemia Musculoskeletal DJD Objective Constitutional respirations regular, non-labored and within target range for patient.. Vitals Time Taken: 9:43 AM, Height: 63 in, Temperature: 98.1 F, Pulse: 75 bpm, Respiratory Rate: 18 breaths/min, Blood Pressure: 130/75 mmHg. Cardiovascular 2+ dorsalis pedis/posterior tibialis pulses. Psychiatric pleasant and cooperative. General Notes: Right lower extremity: Open wound with granulation tissue present. 2+ pitting edema to the knee. Integumentary (Hair, Skin) Wound #3 status is Open. Original cause of wound was Trauma. The date acquired was: 06/20/2022. The wound has been in treatment 17 weeks. The wound is located on the Right,Lateral Lower Leg. The wound measures 0.5cm length x 1cm width x 0.1cm depth; 0.393cm^2 area and 0.039cm^3 volume. There is Fat Layer (Subcutaneous Tissue) exposed. There is no tunneling or undermining noted. There is a medium amount of serosanguineous drainage noted. The  wound margin is distinct with the outline attached to the wound base. There is large (67-100%) red, pink granulation within the wound bed. There is a small (1-33%) amount of necrotic tissue within the wound bed including Adherent Slough. The periwound skin appearance had no abnormalities noted for texture. The periwound skin appearance had no abnormalities noted for moisture. The periwound skin appearance exhibited: Hemosiderin Staining. The periwound skin appearance did not exhibit: Atrophie Blanche, Cyanosis, Ecchymosis, Mottled, Pallor, Rubor, Erythema. Periwound temperature was noted as No Abnormality. [name], [name] ([id number]) [id number] Page 7 of 10 Assessment Active Problems ICD-10 Non-pressure chronic ulcer of other part of right lower leg with fat layer exposed Chronic venous hypertension (idiopathic) with ulcer  of right lower extremity Type 2 diabetes mellitus with other skin ulcer Other early complications of trauma, initial encounter Unspecified open wound, right lower leg, initial encounter Unspecified open wound, left lower leg, initial encounter Patient's wound has improved in size and appearance since last clinic visit. She does not like to keep the wrap on for more than a few days. At this point I think she can do daily dressing changes. Continue Hydrofera Blue and add Medihoney. Also recommended daily compression stockings. If wound were to get larger I did recommend going back to the in office wraps. Plan Follow-up Appointments: Return Appointment in 1 week. - w/ Dr. Heber [place] Return Appointment in 2 weeks. - Dr. Heber [place] Anesthetic: (In clinic) Topical Lidocaine 5% applied to wound bed Bathing/ Shower/ Hygiene: May shower and wash wound with soap and water. Edema Control - Lymphedema / SCD / Other: Elevate legs to the level of the heart or above for 30 minutes daily and/or when sitting for 3-4 times a day throughout the day. Avoid standing  for long periods of time. Exercise regularly Moisturize legs daily. - lotion both legs every night before bed. Compression stocking or Garment 20-30 mm/Hg pressure to: - apply in the morning and remove at night. WOUND #3: - Lower Leg Wound Laterality: Right, Lateral Cleanser: Soap and Water 1 x Per Day/30 Days Discharge Instructions: May shower and wash wound with dial antibacterial soap and water prior to dressing change. Peri-Wound Care: Sween Lotion (Moisturizing lotion) 1 x Per Day/30 Days Discharge Instructions: Apply moisturizing lotion as directed Prim Dressing: Hydrofera Blue Ready Transfer Foam, 2.5x2.5 (in/in) 1 x Per Day/30 Days ary Discharge Instructions: Apply directly to wound bed as directed Prim Dressing: MediHoney Gel, tube 1.5 (oz) 1 x Per Day/30 Days ary Discharge Instructions: Apply to wound bed as instructed Secondary Dressing: Zetuvit Plus Silicone Border Dressing 4x4 (in/in) 1 x Per Day/30 Days Discharge Instructions: Apply silicone border over primary dressing as directed. Com pression Wrap: 20-59mHg compression stocking 1 x Per Day/30 Days Discharge Instructions: apply in the morning and remove at night. 1. Hydrofera Blue with Medihoney under compression stocking daily 2. Follow-up in 1 week Electronic Signature(s) Signed: 10/20/2022 12:20:08 PM By: HKalman ShanDO Entered By: HKalman Shanon 10/20/2022 11:13:12 -------------------------------------------------------------------------------- HxROS Details Patient Name: Date of Service: WRedmond [name] Brandy RO [name]. 10/20/2022 9:30 A M Medical Record Number: [id number] Account Number: [id number] of Birth/Sex: Treating RN: [id number](77y.o. F) Primary Care Provider: JCathlean CowerOther Clinician: Referring Provider: Treating Provider/Extender: HHollie Beachin Treatment: 21 Information Obtained From Patient [name], [name]([id number]  Page 8 of 10 Eyes Medical History: Positive for: Cataracts Negative for: Glaucoma Ear/Nose/Mouth/Throat Medical History: Negative for: Chronic sinus problems/congestion; Middle ear problems Hematologic/Lymphatic Medical History: Positive for: Anemia Negative for: Hemophilia; Human Immunodeficiency Virus; Lymphedema; Sickle Cell Disease Respiratory Medical History: Negative for: Aspiration; Asthma; Chronic Obstructive Pulmonary Disease (COPD); Pneumothorax; Sleep Apnea; Tuberculosis Cardiovascular Medical History: Positive for: Congestive Heart Failure; Hypertension Negative for: Angina; Arrhythmia; Coronary Artery Disease; Deep Vein Thrombosis; Hypotension; Myocardial Infarction; Peripheral Arterial Disease; Peripheral Venous Disease; Phlebitis; Vasculitis Gastrointestinal Medical History: Negative for: Cirrhosis ; Colitis; Crohns; Hepatitis A; Hepatitis [name]; Hepatitis C Past Medical History Notes: diverticulosis hiatal hernia hyperlipidemia Endocrine Medical History: Negative for: Type I Diabetes; Type II Diabetes Genitourinary Medical History: Negative for: End Stage Renal Disease Immunological Medical History: Negative for: Lupus Erythematosus; Raynauds; Scleroderma Integumentary (Skin) Medical History: Negative for: History of Burn Musculoskeletal Medical  History: Positive for: Osteoarthritis Negative for: Gout; Rheumatoid Arthritis; Osteomyelitis Past Medical History Notes: DJD Neurologic Medical History: Negative for: Dementia; Neuropathy; Quadriplegia; Paraplegia; Seizure Disorder Oncologic Medical History: Negative for: Received Chemotherapy Psychiatric Medical History: Negative for: [name] Anxiety [name], [name] ([id number]) [id number] Page 9 of 10 HBO Extended History Items Eyes: Cataracts Immunizations Pneumococcal Vaccine: Received Pneumococcal Vaccination: Yes Received Pneumococcal  Vaccination On or After 60th Birthday: Yes Implantable Devices None Hospitalization / Surgery History Type of Hospitalization/Surgery right total knee replacement Family and Social History Cancer: Yes - Siblings; Diabetes: Yes - Siblings; Heart Disease: Yes - Mother; Hereditary Spherocytosis: No; Hypertension: Yes - Mother; Kidney Disease: No; Lung Disease: No; Seizures: No; Stroke: No; Thyroid Problems: No; Tuberculosis: No; Never smoker; Marital Status - Married; Alcohol Use: Never; Drug Use: No History; Caffeine Use: Moderate; Financial Concerns: No; Food, Clothing or Shelter Needs: No; Support System Lacking: No; Transportation Concerns: No Electronic Signature(s) Signed: 10/20/2022 12:20:08 PM By: Kalman Shan DO Entered By: Kalman Shan on 10/20/2022 10:50:37 -------------------------------------------------------------------------------- SuperBill Details Patient Name: Date of Service: [name], Brandy RO [name]. 10/20/2022 Medical Record Number: [id number] Patient Account Number: [id number] Date of Birth/Sex: Treating RN: Jun 29, 1946 (77 y.o. [name], [name] Primary Care Provider: [name] Other Clinician: Referring Provider: Treating Provider/Extender: Hollie Beach in Treatment: 21 Diagnosis Coding ICD-10 Codes Code Description [phone] Non-pressure chronic ulcer of other part of right lower leg with fat layer exposed I87.311 Chronic venous hypertension (idiopathic) with ulcer of right lower extremity E11.622 Type 2 diabetes mellitus with other skin ulcer T79.8XXA Other early complications of trauma, initial encounter S81.801A Unspecified open wound, right lower leg, initial encounter S81.802A Unspecified open wound, left lower leg, initial encounter Facility Procedures : CPT4 Code: [id number] Description: [facility] VISIT-LEV 3 EST PT Modifier: Quantity: 1 Physician Procedures Electronic Signature(s) Signed: 10/20/2022 12:20:08 PM By:  Kalman Shan DO Entered By: Kalman Shan on 10/20/2022 11:13:35

## 2022-10-21 NOTE — Progress Notes (Signed)
PASTY, MANNINEN ([id number]) [id number] Page 1 of 9 Visit Report for 10/20/2022 Arrival Information Details Patient Name: Date of Service: [name] [name]. 10/20/2022 9:30 A M Medical Record Number: [id number] Patient Account Number: [id number] Date of Birth/Sex: Treating RN: Jan 22, 1946 (77 y.o. F) Primary Care Chloris Marcoux: [name] Other Clinician: Referring Nathanyel Defenbaugh: Treating Myson Levi/Extender: Hollie Beach in Treatment: 21 Visit Information History Since Last Visit Added or deleted any medications: No Patient Arrived: Walker Any new allergies or adverse reactions: No Arrival Time: 09:42 Had a fall or experienced change in No Accompanied By: son activities of daily living that may affect Transfer Assistance: None risk of falls: Patient Identification Verified: Yes Signs or symptoms of abuse/neglect since last visito No Secondary Verification Process Completed: Yes Hospitalized since last visit: No Patient Requires Transmission-Based Precautions: No Implantable device outside of the clinic excluding No Patient Has Alerts: No cellular tissue based products placed in the center since last visit: Has Dressing in Place as Prescribed: Yes Pain Present Now: No Electronic Signature(s) Signed: 10/20/2022 5:25:57 PM By: [name] Entered By: [name] on 10/20/2022 09:43:18 -------------------------------------------------------------------------------- Clinic Level of Care Assessment Details Patient Name: Date of Service: [name] [name]. 10/20/2022 9:30 A M Medical Record Number: [id number] Patient Account Number: [id number] Date of Birth/Sex: Treating RN: 1945-12-17 (77 y.o. [name] Primary Care Montrez Marietta: [name] Other Clinician: Referring Ulah Olmo: Treating Frankye Schwegel/Extender: Hollie Beach in Treatment: 21 Clinic Level of Care Assessment Items TOOL 4 Quantity Score X- 1 0 Use  when only an EandM is performed on FOLLOW-UP visit ASSESSMENTS - Nursing Assessment / Reassessment X- 1 10 Reassessment of Co-morbidities (includes updates in patient status) X- 1 5 Reassessment of Adherence to Treatment Plan ASSESSMENTS - Wound and Skin A ssessment / Reassessment X - Simple Wound Assessment / Reassessment - one wound 1 5 _0  - 0 Complex Wound Assessment / Reassessment - multiple wounds X- 1 10 Dermatologic / Skin Assessment (not related to wound area) ASSESSMENTS - Focused Assessment X- 1 5 Circumferential Edema Measurements - multi extremities _1  - 0 Nutritional Assessment / Counseling / Intervention [name], [name] ([id number]) [id number] Page 2 of 9 _2  - 0 Lower Extremity Assessment (monofilament, tuning fork, pulses) _3  - 0 Peripheral Arterial Disease Assessment (using hand held doppler) ASSESSMENTS - Ostomy and/or Continence Assessment and Care _4  - 0 Incontinence Assessment and Management _5  - 0 Ostomy Care Assessment and Management (repouching, etc.) PROCESS - Coordination of Care X - Simple Patient / Family Education for ongoing care 1 15 _6  - 0 Complex (extensive) Patient / Family Education for ongoing care X- 1 10 Staff obtains Programmer, systems, Records, T Results / Process Orders est _7  - 0 Staff telephones HHA, Nursing Homes / Clarify orders / etc _8  - 0 Routine Transfer to another Facility (non-emergent condition) _9  - 0 Routine Hospital Admission (non-emergent condition) _10  - 0 New Admissions / Biomedical engineer / Ordering NPWT Apligraf, etc. , _11  - 0 Emergency Hospital Admission (emergent condition) X- 1 10 Simple Discharge Coordination _12  - 0 Complex (extensive) Discharge Coordination PROCESS - Special Needs _13  - 0 Pediatric / Minor Patient Management _14  - 0 Isolation Patient Management _15  - 0 Hearing / Language / Visual special needs _16  - 0 Assessment of Community assistance (transportation, D/C  planning, etc.) _17  - 0 Additional assistance / Altered mentation _18  - 0 Support Surface(s) Assessment (bed, cushion, seat, etc.) INTERVENTIONS - Wound Cleansing / Measurement X - Simple  Wound Cleansing - one wound 1 5 _0  - 0 Complex Wound Cleansing - multiple wounds X- 1 5 Wound Imaging (photographs - any number of wounds) _1  - 0 Wound Tracing (instead of photographs) X- 1 5 Simple Wound Measurement - one wound _2  - 0 Complex Wound Measurement - multiple wounds INTERVENTIONS - Wound Dressings X - Small Wound Dressing one or multiple wounds 1 10 _3  - 0 Medium Wound Dressing one or multiple wounds _4  - 0 Large Wound Dressing one or multiple wounds <INOMVEHMCNOBSJGG>_8<\/ZMOQHUTMLYYTKPTW>_6  - 0 Application of Medications - topical <FKCLEXNTZGYFVCBS>_4<\/HQPRFFMBWGYKZLDJ>_5  - 0 Application of Medications - injection INTERVENTIONS - Miscellaneous _7  - 0 External ear exam _8  - 0 Specimen Collection (cultures, biopsies, blood, body fluids, etc.) _9  - 0 Specimen(s) / Culture(s) sent or taken to Lab for analysis _10  - 0 Patient Transfer (multiple staff / Civil Service fast streamer / Similar devices) _11  - 0 Simple Staple / Suture removal (25 or less) _12  - 0 Complex Staple / Suture removal (26 or more) _13  - 0 Hypo / Hyperglycemic Management (close monitor of Blood Glucose) [name], [name] ([id number]) [id number] Page 3 of 9 _14  - 0 Ankle / Brachial Index (ABI) - do not check if billed separately X- 1 5 Vital Signs Has the patient been seen at the hospital within the last three years: Yes Total Score: 100 Level Of Care: New/Established - Level 3 Electronic Signature(s) Signed: 10/20/2022 5:53:54 PM By: [name] RN, BSN Entered By: [name] on 10/20/2022 10:23:14 -------------------------------------------------------------------------------- Encounter Discharge Information Details Patient Name: Date of Service: [name], Brandy RO [name]. 10/20/2022 9:30 A M Medical Record Number: [id number] Patient Account Number: [id number] Date of  Birth/Sex: Treating RN: 1946-05-09 (77 y.o. [name], [name] Primary Care Zakee Deerman: [name] Other Clinician: Referring Caris Cerveny: Treating Nareg Breighner/Extender: Hollie Beach in Treatment: 21 Encounter Discharge Information Items Discharge Condition: Stable Ambulatory Status: Walker Discharge Destination: Home Transportation: Private Auto Accompanied By: son Schedule Follow-up Appointment: Yes Clinical Summary of Care: Electronic Signature(s) Signed: 10/20/2022 5:53:54 PM By: [name] RN, BSN Entered By: [name] on 10/20/2022 10:23:44 -------------------------------------------------------------------------------- Lower Extremity Assessment Details Patient Name: Date of Service: Brandy Busing RO [name]. 10/20/2022 9:30 A M Medical Record Number: [id number] Patient Account Number: [id number] Date of Birth/Sex: Treating RN: 08/05/1946 (77 y.o. F) Primary Care Chlora Mcbain: [name] Other Clinician: Referring Janki Dike: Treating Keiton Cosma/Extender: Hollie Beach in Treatment: 21 Edema Assessment Assessed: [Left: No] [Right: No] Edema: [Left: Ye] [Right: s] Calf Left: Right: Point of Measurement: 41 cm From Medial Instep 44 cm Ankle Left: Right: Point of Measurement: 8 cm From Medial Instep 24.3 cm Electronic Signature(s) Signed: 10/20/2022 5:25:57 PM By: Renford Dills: 10/20/2022 5:25:57 PM By: Janifer Adie ([id number]) [id number] Page 4 of 9 Entered By: [name] on 10/20/2022 09:46:40 -------------------------------------------------------------------------------- Multi Wound Chart Details Patient Name: Date of Service: [name] [name]. 10/20/2022 9:30 A M Medical Record Number: [id number] Patient Account Number: [id number] Date of Birth/Sex: Treating RN: [birth year] (77 y.o. F) Primary Care Emmilia Sowder: [name] Other Clinician: Referring Taleisha Kaczynski: Treating  Lilou Kneip/Extender: Hollie Beach in Treatment: 21 Vital Signs Height(in): 72 Pulse(bpm): 8 Weight(lbs): Blood Pressure(mmHg): 130/75 Body Mass Index(BMI): Temperature(F): 98.1 Respiratory Rate(breaths/min): 18 [3:Photos:] [N/A:N/A] Right, Lateral Lower Leg N/A N/A Wound Location: Trauma N/A N/A Wounding Event: Trauma, Other N/A N/A Primary Etiology: Cataracts, Anemia, Congestive Heart N/A N/A Comorbid History: Failure, Hypertension, Osteoarthritis 06/20/2022 N/A N/A Date Acquired: 17 N/A N/A Weeks  of Treatment: Open N/A N/A Wound Status: No N/A N/A Wound Recurrence: 0.5x1x0.1 N/A N/A Measurements L x W x D (cm) 0.393 N/A N/A A (cm) : rea 0.039 N/A N/A Volume (cm) : 99.00% N/A N/A % Reduction in Area: 99.50% N/A N/A % Reduction in Volume: Full Thickness With Exposed Support N/A N/A Classification: Structures Medium N/A N/A Exudate Amount: Serosanguineous N/A N/A Exudate Type: red, brown N/A N/A Exudate Color: Distinct, outline attached N/A N/A Wound Margin: Large (67-100%) N/A N/A Granulation Amount: Red, Pink N/A N/A Granulation Quality: Small (1-33%) N/A N/A Necrotic Amount: Fat Layer (Subcutaneous Tissue): Yes N/A N/A Exposed Structures: Fascia: No Tendon: No Muscle: No Joint: No Bone: No Large (67-100%) N/A N/A Epithelialization: Excoriation: No N/A N/A Periwound Skin Texture: Induration: No Callus: No Crepitus: No Rash: No Scarring: No Maceration: No N/A N/A Periwound Skin Moisture: Dry/Scaly: No Hemosiderin Staining: Yes N/A N/A Periwound Skin Color: Atrophie Blanche: No Cyanosis: No Ecchymosis: No Erythema: No [name], [name] ([id number]) [id number] Page 5 of 9 Mottled: No Pallor: No Rubor: No No Abnormality N/A N/A Temperature: Treatment Notes Wound #3 (Lower Leg) Wound Laterality: Right, Lateral Cleanser Soap and Water Discharge Instruction: May shower and wash wound  with dial antibacterial soap and water prior to dressing change. Peri-Wound Care Sween Lotion (Moisturizing lotion) Discharge Instruction: Apply moisturizing lotion as directed Topical Primary Dressing Hydrofera Blue Ready Transfer Foam, 2.5x2.5 (in/in) Discharge Instruction: Apply directly to wound bed as directed MediHoney Gel, tube 1.5 (oz) Discharge Instruction: Apply to wound bed as instructed Secondary Dressing Zetuvit Plus Silicone Border Dressing 4x4 (in/in) Discharge Instruction: Apply silicone border over primary dressing as directed. Secured With Compression Wrap 20-3mHg compression stocking Discharge Instruction: apply in the morning and remove at night. Compression Stockings Add-Ons Electronic Signature(s) Signed: 10/20/2022 12:20:08 PM By: HKalman ShanDO Entered By: HKalman Shanon 10/20/2022 11:12:56 -------------------------------------------------------------------------------- Multi-Disciplinary Care Plan Details Patient Name: Date of Service: WRedmond [name] Brandy RO [name]. 10/20/2022 9:30 A M Medical Record Number: [id number] Account Number: [id number] of Birth/Sex: Treating RN: 410-30-1947(77y.o. FHelene Shoe BMeta.RedingPrimary Care Deantre Bourdon: JCathlean CowerOther Clinician: Referring Ezri Fanguy: Treating Timberlee Roblero/Extender: HHollie Beachin Treatment: 21 Active Inactive Pain, Acute or Chronic Nursing Diagnoses: Pain, acute or chronic: actual or potential Potential alteration in comfort, pain Goals: Patient will verbalize adequate pain control and receive pain control interventions during procedures as needed Date Initiated: 05/25/2022 Date Inactivated: 09/29/2022 Target Resolution Date: 09/29/2022 Goal Status: Met WZHAVIA, CUNANAN([id number] Page 6 of 9 Patient/caregiver will verbalize comfort level met Date Initiated: 05/25/2022 Target Resolution Date: 11/20/2022 Goal Status:  Active Interventions: Complete pain assessment as per visit requirements Encourage patient to take pain medications as prescribed Provide education on pain management Treatment Activities: Administer pain control measures as ordered : 05/25/2022 Notes: Venous Leg Ulcer Nursing Diagnoses: Knowledge deficit related to disease process and management Goals: Non-invasive venous studies are completed as ordered Date Initiated: 05/25/2022 Target Resolution Date: 11/19/2022 Goal Status: Active Interventions: Assess peripheral edema status every visit. Provide education on venous insufficiency Treatment Activities: Non-invasive vascular studies : 05/25/2022 Notes: Wound/Skin Impairment Nursing Diagnoses: Knowledge deficit related to ulceration/compromised skin integrity Goals: Patient/caregiver will verbalize understanding of skin care regimen Date Initiated: 05/25/2022 Date Inactivated: 09/29/2022 Target Resolution Date: 09/29/2022 Goal Status: Met Ulcer/skin breakdown will heal within 14 weeks Date Initiated: 05/25/2022 Target Resolution Date: 11/20/2022 Goal Status: Active Interventions: Assess patient/caregiver ability to obtain necessary supplies Assess patient/caregiver ability to perform ulcer/skin care  regimen upon admission and as needed Provide education on ulcer and skin care Treatment Activities: Skin care regimen initiated : 05/25/2022 Topical wound management initiated : 05/25/2022 Notes: Electronic Signature(s) Signed: 10/20/2022 5:53:54 PM By: [name] RN, BSN Entered By: [name] on 10/20/2022 10:22:27 -------------------------------------------------------------------------------- Pain Assessment Details Patient Name: Date of Service: [name], Brandy RO [name]. 10/20/2022 9:30 A M Medical Record Number: [id number] Patient Account Number: [id number] Date of Birth/Sex: Treating RN: [birth year] (77 y.o. F) Primary Care Oluwateniola Leitch: [name] Other Clinician: Referring  Carline Dura: Treating Antuane Eastridge/Extender: Hollie Beach in Treatment: [address], [place] [name] ([id number]) [id number] Page 7 of 9 Active Problems Location of Pain Severity and Description of Pain Patient Has Paino No Site Locations Pain Management and Medication Current Pain Management: Electronic Signature(s) Signed: 10/20/2022 5:25:57 PM By: [name] Entered By: [name] on 10/20/2022 09:43:43 -------------------------------------------------------------------------------- Patient/Caregiver Education Details Patient Name: Date of Service: [name], Brandy RO [name]. 1/2/2024andnbsp9:30 A M Medical Record Number: [id number] Patient Account Number: [id number] Date of Birth/Gender: Treating RN: 09-05-1946 (77 y.o. [name] Primary Care Physician: [name] Other Clinician: Referring Physician: Treating Physician/Extender: Hollie Beach in Treatment: 21 Education Assessment Education Provided To: Patient Education Topics Provided Wound/Skin Impairment: Handouts: Caring for Your Ulcer Methods: Explain/Verbal Responses: Reinforcements needed Electronic Signature(s) Signed: 10/20/2022 5:53:54 PM By: [name] RN, BSN Entered By: [name] on 10/20/2022 10:22:48 [name] ([id number]) [id number] Page 8 of 9 -------------------------------------------------------------------------------- Wound Assessment Details Patient Name: Date of Service: [name] [name]. 10/20/2022 9:30 A M Medical Record Number: [id number] Patient Account Number: [id number] Date of Birth/Sex: Treating RN: [birth year] (77 y.o. F) Primary Care Amaya Blakeman: [name] Other Clinician: Referring Ratasha Fabre: Treating Giulia Hickey/Extender: Hollie Beach in Treatment: 21 Wound Status Wound Number: 3 Primary Trauma, Other Etiology: Wound Location: Right, Lateral Lower Leg Wound  Status: Open Wounding Event: Trauma Comorbid Cataracts, Anemia, Congestive Heart Failure, Hypertension, Date Acquired: 06/20/2022 History: Osteoarthritis Weeks Of Treatment: 17 Clustered Wound: No Photos Wound Measurements Length: (cm) Width: (cm) Depth: (cm) Area: (cm) Volume: (cm) 0.5 % Reduction in Area: 99% 1 % Reduction in Volume: 99.5% 0.1 Epithelialization: Large (67-100%) 0.393 Tunneling: No 0.039 Undermining: No Wound Description Classification: Full Thickness With Exposed Suppor Wound Margin: Distinct, outline attached Exudate Amount: Medium Exudate Type: Serosanguineous Exudate Color: red, brown t Structures Foul Odor After Cleansing: No Slough/Fibrino Yes Wound Bed Granulation Amount: Large (67-100%) Exposed Structure Granulation Quality: Red, Pink Fascia Exposed: No Necrotic Amount: Small (1-33%) Fat Layer (Subcutaneous Tissue) Exposed: Yes Necrotic Quality: Adherent Slough Tendon Exposed: No Muscle Exposed: No Joint Exposed: No Bone Exposed: No Periwound Skin Texture Texture Color No Abnormalities Noted: Yes No Abnormalities Noted: No Atrophie Blanche: No Moisture Cyanosis: No No Abnormalities Noted: Yes Ecchymosis: No Erythema: No Hemosiderin Staining: Yes Mottled: No Pallor: No Rubor: No Temperature / Pain Temperature: No Abnormality Treatment Notes Wound #3 (Lower Leg) Wound Laterality: Right, Lateral Cleanser [name], [name] ([id number]) [id number] Page 9 of 9 Soap and Water Discharge Instruction: May shower and wash wound with dial antibacterial soap and water prior to dressing change. Peri-Wound Care Sween Lotion (Moisturizing lotion) Discharge Instruction: Apply moisturizing lotion as directed Topical Primary Dressing Hydrofera Blue Ready Transfer Foam, 2.5x2.5 (in/in) Discharge Instruction: Apply directly to wound bed as directed MediHoney Gel, tube 1.5 (oz) Discharge Instruction: Apply to wound bed  as instructed Secondary Dressing Zetuvit Plus Silicone Border Dressing 4x4 (in/in) Discharge Instruction: Apply silicone border  over primary dressing as directed. Secured With Compression Wrap 20-45mHg compression stocking Discharge Instruction: apply in the morning and remove at night. Compression Stockings Add-Ons Electronic Signature(s) Signed: 10/20/2022 5:25:57 PM By: DErenest BlankEntered By: DErenest Blankon 10/20/2022 09:47:48 -------------------------------------------------------------------------------- Vitals Details Patient Name: Date of Service: WRedmond [name] Brandy RO [name]. 10/20/2022 9:30 A M Medical Record Number: [id number] Account Number: [id number] of Birth/Sex: Treating RN: 402-18-1947(77y.o. F) Primary Care Roshaun Pound: JCathlean CowerOther Clinician: Referring Michalla Ringer: Treating Jonel Olver/Extender: HHollie Beachin Treatment: 21 Vital Signs Time Taken: 09:43 Temperature (F): 98.1 Height (in): 63 Pulse (bpm): 75 Respiratory Rate (breaths/min): 18 Blood Pressure (mmHg): 130/75 Reference Range: 80 - 120 mg / dl Electronic Signature(s) Signed: 10/20/2022 5:25:57 PM By: DErenest BlankEntered By: DErenest Blankon 10/20/2022 09:43:37

## 2022-10-22 NOTE — Progress Notes (Signed)
ROGINA, SCHIANO ([id number]) [id number] Page 1 of 10 Visit Report for 08/17/2022 Chief Complaint Document Details Patient Name: Date of Service: [name] 08/17/2022 9:15 A M Medical Record Number: [id number] Patient Account Number: [id number] Date of Birth/Sex: Treating RN: 09-Jan-1946 (77 y.o. [name], [name] Primary Care Provider: [name] Other Clinician: Referring Provider: Treating Provider/Extender: Hollie Beach in Treatment: 12 Information Obtained from: Patient Chief Complaint 05/25/2022; left lower extremity wound following a dog scratch 06/23/2022; lacerations to the right and left lower extremity status post fall Electronic Signature(s) Signed: 08/17/2022 2:03:41 PM By: Kalman Shan DO Entered By: Kalman Shan on 08/17/2022 09:45:26 -------------------------------------------------------------------------------- Debridement Details Patient Name: Date of Service: [name], CA RO [name] 08/17/2022 9:15 A M Medical Record Number: [id number] Patient Account Number: [id number] Date of Birth/Sex: Treating RN: 10/11/1946 (77 y.o. [name], [name] Primary Care Provider: [name] Other Clinician: Referring Provider: Treating Provider/Extender: Hollie Beach in Treatment: 12 Debridement Performed for Assessment: Wound #3 Right,Lateral Lower Leg Performed By: Physician Kalman Shan, DO Debridement Type: Debridement Level of Consciousness (Pre-procedure): Awake and Alert Pre-procedure Verification/Time Out Yes - 09:35 Taken: Start Time: 09:35 Pain Control: Lidocaine T Area Debrided (L x W): otal 2 (cm) x 2.4 (cm) = 4.8 (cm) Tissue and other material debrided: Viable, Non-Viable, Slough, Subcutaneous, Slough Level: Skin/Subcutaneous Tissue Debridement Description: Excisional Instrument: Curette Bleeding: Minimum Hemostasis Achieved: Pressure End Time: 09:35 Procedural  Pain: 0 Post Procedural Pain: 0 Response to Treatment: Procedure was tolerated well Level of Consciousness (Post- Awake and Alert procedure): Post Debridement Measurements of Total Wound Length: (cm) 2 Width: (cm) 2.4 Depth: (cm) 0.4 Volume: (cm) 1.508 Character of Wound/Ulcer Post Debridement: Improved ILA, LANDOWSKI B ([id number]) [id number] Page 2 of 10 Post Procedure Diagnosis Same as Pre-procedure Electronic Signature(s) Signed: 08/17/2022 2:03:41 PM By: Kalman Shan DO Signed: 10/21/2022 5:36:43 PM By: Rhae Hammock RN Entered By: Rhae Hammock on 08/17/2022 09:35:36 -------------------------------------------------------------------------------- HPI Details Patient Name: Date of Service: [name], CA RO [name] 08/17/2022 9:15 A M Medical Record Number: [id number] Patient Account Number: [id number] Date of Birth/Sex: Treating RN: 11-07-1945 (77 y.o. [name], [name] Primary Care Provider: [name] Other Clinician: Referring Provider: Treating Provider/Extender: Hollie Beach in Treatment: 12 History of Present Illness HPI Description: Admission 05/25/2022 [name] is a 77 year old female with a past medical history of diet-controlled type 2 diabetes, chronic 3 stage kidney disease and venous insufficiency that presents to the clinic for a 1 month history of nonhealing ulcer to the left leg. She states that her dog scratched her causing a wound. She has developed cellulitis in this leg and has been treated with clindamycin by her primary care physician. She reports improvement in her symptoms. She currently denies signs of infection. She has been keeping the area covered. She does not wear compression stockings. She is on 80 mg of Lasix twice daily. She has had reflux studies to the left leg on 07/2021 that notes venous reflux throughout the left common femoral vein and greater saphenous vein. She  has not had an ablation. She follows with vein and vascular for her venous insufficiency. 8/14; patient presents for follow-up. She states that the wrap stayed on for 4 days and eventually slid down. She has been wearing her compression stocking and doing dressing changes with Hydrofera Blue since. She also states she has a hard time putting on her shoes with the 3 layer compression. She denies signs  of infection. 8/21; patient presents for follow-up. She again had trouble with the wrap sliding down. We have been using Hydrofera Blue with gentamicin under 2 layer Coflex. She currently denies signs of infection. 8/28; patient presents for follow-up. She reports taking the wrap off yesterday. We have been using Hydrofera Blue with gentamicin under 2 layer Coflex. She states that the wrap does slide down but remains over the wound. She is currently wearing her compression stocking. She would like to do this instead of the compression wrap. 9/5; patient presents for follow-up. She has been using Medihoney to the left lower extremity remedy wound with her compression stockings. She reports no drainage from the site. Unfortunately she fell over the weekend and had to go to the ED due to lacerations she experienced on her left and right lower extremity. On the left leg she had 10 sutures placed on the right leg she had 7. She was given Keflex. Today she reports no signs of infection. She has been using Xeroform to the suture sites. 9/11; patient presents for follow-up. She has been using bacitracin ointment to the suture line. She currently denies signs of infection. 9/19; patient presents for follow-up. She has been using Medihoney to the wound beds. She denies signs of infection. 9/26; patient presents for follow-up. She has been using Dakin's wet-to-dry dressings to the wound beds. She reports improvement in wound healing. She has no issues or complaints today. 10/3; patient presents for follow-up. We  had used compression therapy along with Hydrofera Blue to the right lower extremity. She states she felt uncomfortable with the wrap and took it off 3 days ago. She has been using Medihoney without issues to the left lower extremity wound. She denies signs of infection. 10/10; patient presents for follow-up. She has been using Hydrofera Blue and Medihoney to the left lower extremity wound. We have been using Hydrofera Blue and Santyl under compression therapy to the right lower extremity. She states that the wrap slid down 2 days ago and she took it off. She has no issues or complaints today. She denies signs of infection. 10/17; patient presents for follow-up. We have been using Hydrofera Blue to the left lower extremity wounds and Hydrofera Blue and Santyl under compression therapy to the right lower extremity. She has no issues or complaints today. She denies signs of infection. 10/23; patient presents for follow-up. We have been using silver alginate to the left lower extremity and Hydrofera Blue and Santyl under Kerlix/Coban to the right lower extremity. It is unclear which she is using to the left leg. She denies signs of infection. She has no issues or complaints today. 10/30; patient presents for follow-up. We have been using Hydrofera Blue and Santyl under Kerlix/Coban to the right lower extremity and silver alginate with Medihoney to the left lower extremity under compression stocking. She has no issues or complaints today. Electronic Signature(s) Signed: 08/17/2022 2:03:41 PM By: Kalman Shan DO Entered By: Kalman Shan on 08/17/2022 09:45:57 Thomasene Mohair ([id number]) [id number] Page 3 of 10 -------------------------------------------------------------------------------- Physical Exam Details Patient Name: Date of Service: [name] [name] 08/17/2022 9:15 A M Medical Record Number: [id number] Patient Account Number: [id number] Date of  Birth/Sex: Treating RN: 04-Feb-1946 (77 y.o. [name], [name] Primary Care Provider: [name] Other Clinician: Referring Provider: Treating Provider/Extender: Hollie Beach in Treatment: 12 Constitutional respirations regular, non-labored and within target range for patient.. Cardiovascular 2+ dorsalis pedis/posterior tibialis pulses. Psychiatric pleasant and cooperative. Notes  Right lower extremity: Nonviable tissue and granulation tissue in the wound bed. 2+ pitting edema to the knee. Left lower extremity: 1 small narrow opening with increased depth of about 1 to 2 cm. No signs of infection. The other 2 narrow tunnels Adjacent to the remaining 1 have closed. Electronic Signature(s) Signed: 08/17/2022 2:03:41 PM By: Kalman Shan DO Entered By: Kalman Shan on 08/17/2022 09:46:52 -------------------------------------------------------------------------------- Physician Orders Details Patient Name: Date of Service: [name], CA RO [name] 08/17/2022 9:15 A M Medical Record Number: [id number] Patient Account Number: [id number] Date of Birth/Sex: Treating RN: 03/06/1946 (77 y.o. [name], [name] Primary Care Provider: [name] Other Clinician: Referring Provider: Treating Provider/Extender: Hollie Beach in Treatment: 12 Verbal / Phone Orders: No Diagnosis Coding Follow-up Appointments ppointment in 2 weeks. - w/ Dr. Heber [place] Return A Nurse Visit: - 1 week on Monday Anesthetic (In clinic) Topical Lidocaine 5% applied to wound bed Bathing/ Shower/ Hygiene May shower with protection but do not get wound dressing(s) wet. Edema Control - Lymphedema / SCD / Other Elevate legs to the level of the heart or above for 30 minutes daily and/or when sitting, a frequency of: - 2-3 times a day throughout the day. Avoid standing for long periods of time. Exercise regularly Moisturize legs daily. - lotion both legs every night before  bed. Compression stocking or Garment 20-30 mm/Hg pressure to: - apply in the morning and remove at night. Wound Treatment Wound #2 - Upper Leg Wound Laterality: Left, Lateral Cleanser: dakin's solution Every Other Day/30 Days Discharge Instructions: clean with dakins Prim Dressing: KerraCel Ag Gelling Fiber Dressing, 2x2 in (silver alginate) (Generic) Every Other Day/30 Days ary DAVANEE, KLINKNER ([id number]) [id number] Page 4 of 10 Discharge Instructions: Apply silver alginate to wound bed as instructed Prim Dressing: MediHoney Gel, tube 1.5 (oz) Every Other Day/30 Days ary Discharge Instructions: Apply to wound bed as instructed Secondary Dressing: Woven Gauze Sponges 2x2 in Every Other Day/30 Days Discharge Instructions: Apply over primary dressing as directed. Secondary Dressing: Zetuvit Plus Silicone Border Dressing 7x7(in/in) Every Other Day/30 Days Discharge Instructions: Apply silicone border over primary dressing as directed. Wound #3 - Lower Leg Wound Laterality: Right, Lateral Cleanser: Soap and Water 1 x Per Week/30 Days Discharge Instructions: May shower and wash wound with dial antibacterial soap and water prior to dressing change. Peri-Wound Care: Sween Lotion (Moisturizing lotion) 1 x Per Week/30 Days Discharge Instructions: Apply moisturizing lotion as directed Prim Dressing: Hydrofera Blue Classic Foam, 4x4 in 1 x Per Week/30 Days ary Discharge Instructions: Moisten with saline prior to applying to wound bed Prim Dressing: Santyl Ointment 1 x Per Week/30 Days ary Discharge Instructions: Apply nickel thick amount to wound bed as instructed Secondary Dressing: Woven Gauze Sponges 2x2 in 1 x Per Week/30 Days Discharge Instructions: Apply over primary dressing as directed. Secondary Dressing: Zetuvit Plus 4x8 in 1 x Per Week/30 Days Discharge Instructions: Apply over primary dressing as directed. Compression Wrap: Kerlix Roll 4.5x3.1 (in/yd) 1  x Per Week/30 Days Discharge Instructions: Apply Kerlix and Coban compression as directed. Compression Wrap: Coban Self-Adherent Wrap 4x5 (in/yd) 1 x Per Week/30 Days Discharge Instructions: Apply over Kerlix as directed. Electronic Signature(s) Signed: 08/17/2022 2:03:41 PM By: Kalman Shan DO Entered By: Kalman Shan on 08/17/2022 09:47:00 -------------------------------------------------------------------------------- Problem List Details Patient Name: Date of Service: [name], CA RO [name] 08/17/2022 9:15 A M Medical Record Number: [id number] Patient Account Number: [id number] Date of Birth/Sex: Treating RN: [birth year] (77 y.o. 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/04/2022 No Yes L97.812 Non-pressure chronic ulcer of other part of right lower leg with fat layer 07/14/2022 No Yes exposed I87.313 Chronic venous hypertension (idiopathic) with ulcer of bilateral lower extremity 07/14/2022 No Yes LOGYN, DEDOMINICIS B ([id number]) [id number] Page 5 of 10 E11.622 Type 2 diabetes mellitus with other skin ulcer 05/25/2022 No Yes T79.8XXA Other early complications of trauma, initial encounter 06/23/2022 No Yes S81.801A Unspecified open wound, right lower leg, initial encounter 06/23/2022 No Yes S81.802A Unspecified open wound, left lower leg, initial encounter 06/23/2022 No Yes Inactive Problems Resolved Problems ICD-10 Code Description Active Date Resolved Date W54.8XXA Other contact with dog, initial encounter 05/25/2022 05/25/2022 Electronic Signature(s) Signed: 08/17/2022 2:03:41 PM By: Kalman Shan DO Entered By: Kalman Shan on 08/17/2022  09:45:13 -------------------------------------------------------------------------------- Progress Note Details Patient Name: Date of Service: [name], CA RO [name] 08/17/2022 9:15 A M Medical Record Number: [id number] Patient Account Number: [id number] Date of Birth/Sex: Treating RN: [birth year] (77 y.o. [name], [name] Primary Care Provider: [name] Other Clinician: Referring Provider: Treating Provider/Extender: Hollie Beach in Treatment: 12 Subjective Chief Complaint Information obtained from Patient 05/25/2022; left lower extremity wound following a dog scratch 06/23/2022; lacerations to the right and left lower extremity status post fall History of Present Illness (HPI) Admission 05/25/2022 Ms. Saira Kramme is a 77 year old female with a past medical history of diet-controlled type 2 diabetes, chronic 3 stage kidney disease and venous insufficiency that presents to the clinic for a 1 month history of nonhealing ulcer to the left leg. She states that her dog scratched her causing a wound. She has developed cellulitis in this leg and has been treated with clindamycin by her primary care physician. She reports improvement in her symptoms. She currently denies signs of infection. She has been keeping the area covered. She does not wear compression stockings. She is on 80 mg of Lasix twice daily. She has had reflux studies to the left leg on 07/2021 that notes venous reflux throughout the left common femoral vein and greater saphenous vein. She has not had an ablation. She follows with vein and vascular for her venous insufficiency. 8/14; patient presents for follow-up. She states that the wrap stayed on for 4 days and eventually slid down. She has been wearing her compression stocking and doing dressing changes with Hydrofera Blue since. She also states she has a hard time putting on her shoes with the 3 layer compression. She denies signs of infection. 8/21;  patient presents for follow-up. She again had trouble with the wrap sliding down. We have been using Hydrofera Blue with gentamicin under 2 layer Coflex. She currently denies signs of infection. 8/28; patient presents for follow-up. She reports taking the wrap off yesterday. We have been using Hydrofera Blue with gentamicin under 2 layer Coflex. She states that the wrap does slide down but remains over the wound. She is currently wearing her compression stocking. She would like to do this instead of the compression wrap. 9/5; patient presents for follow-up. She has been using Medihoney to the left lower extremity remedy wound with her compression stockings. She reports no drainage from the site. Unfortunately she fell over the weekend and had to go to the ED due to lacerations she experienced  on her left and right lower ADAEZE, BETTER B ([id number]) [id number] Page 6 of 10 extremity. On the left leg she had 10 sutures placed on the right leg she had 7. She was given Keflex. Today she reports no signs of infection. She has been using Xeroform to the suture sites. 9/11; patient presents for follow-up. She has been using bacitracin ointment to the suture line. She currently denies signs of infection. 9/19; patient presents for follow-up. She has been using Medihoney to the wound beds. She denies signs of infection. 9/26; patient presents for follow-up. She has been using Dakin's wet-to-dry dressings to the wound beds. She reports improvement in wound healing. She has no issues or complaints today. 10/3; patient presents for follow-up. We had used compression therapy along with Hydrofera Blue to the right lower extremity. She states she felt uncomfortable with the wrap and took it off 3 days ago. She has been using Medihoney without issues to the left lower extremity wound. She denies signs of infection. 10/10; patient presents for follow-up. She has been using Hydrofera  Blue and Medihoney to the left lower extremity wound. We have been using Hydrofera Blue and Santyl under compression therapy to the right lower extremity. She states that the wrap slid down 2 days ago and she took it off. She has no issues or complaints today. She denies signs of infection. 10/17; patient presents for follow-up. We have been using Hydrofera Blue to the left lower extremity wounds and Hydrofera Blue and Santyl under compression therapy to the right lower extremity. She has no issues or complaints today. She denies signs of infection. 10/23; patient presents for follow-up. We have been using silver alginate to the left lower extremity and Hydrofera Blue and Santyl under Kerlix/Coban to the right lower extremity. It is unclear which she is using to the left leg. She denies signs of infection. She has no issues or complaints today. 10/30; patient presents for follow-up. We have been using Hydrofera Blue and Santyl under Kerlix/Coban to the right lower extremity and silver alginate with Medihoney to the left lower extremity under compression stocking. She has no issues or complaints today. Patient History Information obtained from Patient. Family History Cancer - Siblings, Diabetes - Siblings, Heart Disease - Mother, Hypertension - Mother, No family history of Hereditary Spherocytosis, Kidney Disease, Lung Disease, Seizures, Stroke, Thyroid Problems, Tuberculosis. Social History Never smoker, Marital Status - Married, Alcohol Use - Never, Drug Use - No History, Caffeine Use - Moderate. Medical History Eyes Patient has history of Cataracts Denies history of Glaucoma Ear/Nose/Mouth/Throat Denies history of Chronic sinus problems/congestion, Middle ear problems Hematologic/Lymphatic Patient has history of Anemia Denies history of Hemophilia, Human Immunodeficiency Virus, Lymphedema, Sickle Cell Disease Respiratory Denies history of Aspiration, Asthma, Chronic Obstructive Pulmonary  Disease (COPD), Pneumothorax, Sleep Apnea, Tuberculosis Cardiovascular Patient has history of Congestive Heart Failure, Hypertension Denies history of Angina, Arrhythmia, Coronary Artery Disease, Deep Vein Thrombosis, Hypotension, Myocardial Infarction, Peripheral Arterial Disease, Peripheral Venous Disease, Phlebitis, Vasculitis Gastrointestinal Denies history of Cirrhosis , Colitis, Crohnoos, Hepatitis A, Hepatitis B, Hepatitis C Endocrine Denies history of Type I Diabetes, Type II Diabetes Genitourinary Denies history of End Stage Renal Disease Immunological Denies history of Lupus Erythematosus, Raynaudoos, Scleroderma Integumentary (Skin) Denies history of History of Burn Musculoskeletal Patient has history of Osteoarthritis Denies history of Gout, Rheumatoid Arthritis, Osteomyelitis Neurologic Denies history of Dementia, Neuropathy, Quadriplegia, Paraplegia, Seizure Disorder Oncologic Denies history of Received Chemotherapy Psychiatric Denies history of Anorexia/bulimia, Confinement Anxiety Hospitalization/Surgery History - right  total knee replacement. Medical A Surgical History Notes nd Gastrointestinal diverticulosis hiatal hernia hyperlipidemia Musculoskeletal DJD Objective KEMYRA, AUGUST ([id number]) [id number] Page 7 of 10 Constitutional respirations regular, non-labored and within target range for patient.. Vitals Time Taken: 9:09 AM, Height: 63 in, Temperature: 98.2 F, Pulse: 78 bpm, Respiratory Rate: 18 breaths/min, Blood Pressure: 146/79 mmHg. Cardiovascular 2+ dorsalis pedis/posterior tibialis pulses. Psychiatric pleasant and cooperative. General Notes: Right lower extremity: Nonviable tissue and granulation tissue in the wound bed. 2+ pitting edema to the knee. Left lower extremity: 1 small narrow opening with increased depth of about 1 to 2 cm. No signs of infection. The other 2 narrow tunnels Adjacent to the remaining 1  have closed. Integumentary (Hair, Skin) Wound #2 status is Open. Original cause of wound was Trauma. The date acquired was: 06/20/2022. The wound has been in treatment 7 weeks. The wound is located on the Left,Lateral Upper Leg. The wound measures 0.2cm length x 0.6cm width x 0.1cm depth; 0.094cm^2 area and 0.009cm^3 volume. There is Fat Layer (Subcutaneous Tissue) exposed. There is a medium amount of serosanguineous drainage noted. The wound margin is distinct with the outline attached to the wound base. There is large (67-100%) red, pink granulation within the wound bed. There is no necrotic tissue within the wound bed. The periwound skin appearance had no abnormalities noted for texture. The periwound skin appearance had no abnormalities noted for moisture. The periwound skin appearance had no abnormalities noted for color. Periwound temperature was noted as No Abnormality. Wound #3 status is Open. Original cause of wound was Trauma. The date acquired was: 06/20/2022. The wound has been in treatment 7 weeks. The wound is located on the Right,Lateral Lower Leg. The wound measures 2cm length x 2.4cm width x 0.4cm depth; 3.77cm^2 area and 1.508cm^3 volume. There is Fat Layer (Subcutaneous Tissue) exposed. There is no tunneling or undermining noted. There is a medium amount of serosanguineous drainage noted. The wound margin is distinct with the outline attached to the wound base. There is large (67-100%) red, pink, hyper - granulation within the wound bed. There is a small (1- 33%) amount of necrotic tissue within the wound bed including Adherent Slough. The periwound skin appearance had no abnormalities noted for texture. The periwound skin appearance had no abnormalities noted for moisture. The periwound skin appearance exhibited: Hemosiderin Staining. The periwound skin appearance did not exhibit: Atrophie Blanche, Cyanosis, Ecchymosis, Mottled, Pallor, Rubor, Erythema. Periwound temperature was noted  as No Abnormality. Assessment Active Problems ICD-10 Non-pressure chronic ulcer of other part of left lower leg with fat layer exposed Non-pressure chronic ulcer of other part of right lower leg with fat layer exposed Chronic venous hypertension (idiopathic) with ulcer of bilateral lower extremity Type 2 diabetes mellitus with other skin ulcer Other early complications of trauma, initial encounter Unspecified open wound, right lower leg, initial encounter Unspecified open wound, left lower leg, initial encounter Patient's wounds have shown improvement in size and appearance since last clinic visit. I debrided nonviable tissue to the right lower extremity wound. I recommended continue Hydrofera Blue and Santyl here under compression therapy. T the left lower extremity wound 2 of the 3 narrow tunnels have healed. o I recommended continuing silver alginate with Medihoney to the remaining tunnel. Follow-up in 1 week. Procedures Wound #3 Pre-procedure diagnosis of Wound #3 is a Trauma, Other located on the Right,Lateral Lower Leg . There was a Excisional Skin/Subcutaneous Tissue Debridement with a total area of 4.8 sq cm performed by Kalman Shan, DO.  With the following instrument(s): Curette to remove Viable and Non-Viable tissue/material. Material removed includes Subcutaneous Tissue and Slough and after achieving pain control using Lidocaine. No specimens were taken. A time out was conducted at 09:35, prior to the start of the procedure. A Minimum amount of bleeding was controlled with Pressure. The procedure was tolerated well with a pain level of 0 throughout and a pain level of 0 following the procedure. Post Debridement Measurements: 2cm length x 2.4cm width x 0.4cm depth; 1.508cm^3 volume. Character of Wound/Ulcer Post Debridement is improved. Post procedure Diagnosis Wound #3: Same as Pre-Procedure Plan Follow-up Appointments: Return Appointment in 2 weeks. - w/ Dr. Heber Ochelata Nurse  Visit: - 1 week on Monday Anesthetic: (In clinic) Topical Lidocaine 5% applied to wound bed Bathing/ Shower/ Hygiene: May shower with protection but do not get wound dressing(s) wet. Edema Control - Lymphedema / SCD / Other: Elevate legs to the level of the heart or above for 30 minutes daily and/or when sitting, a frequency of: - 2-3 times a day throughout the day. Avoid standing for long periods of time. JAYLISSA, FELTY ([id number]) [id number] Page 8 of 10 Exercise regularly Moisturize legs daily. - lotion both legs every night before bed. Compression stocking or Garment 20-30 mm/Hg pressure to: - apply in the morning and remove at night. WOUND #2: - Upper Leg Wound Laterality: Left, Lateral Cleanser: dakin's solution Every Other Day/30 Days Discharge Instructions: clean with dakins Prim Dressing: KerraCel Ag Gelling Fiber Dressing, 2x2 in (silver alginate) (Generic) Every Other Day/30 Days ary Discharge Instructions: Apply silver alginate to wound bed as instructed Prim Dressing: MediHoney Gel, tube 1.5 (oz) Every Other Day/30 Days ary Discharge Instructions: Apply to wound bed as instructed Secondary Dressing: Woven Gauze Sponges 2x2 in Every Other Day/30 Days Discharge Instructions: Apply over primary dressing as directed. Secondary Dressing: Zetuvit Plus Silicone Border Dressing 7x7(in/in) Every Other Day/30 Days Discharge Instructions: Apply silicone border over primary dressing as directed. WOUND #3: - Lower Leg Wound Laterality: Right, Lateral Cleanser: Soap and Water 1 x Per Week/30 Days Discharge Instructions: May shower and wash wound with dial antibacterial soap and water prior to dressing change. Peri-Wound Care: Sween Lotion (Moisturizing lotion) 1 x Per Week/30 Days Discharge Instructions: Apply moisturizing lotion as directed Prim Dressing: Hydrofera Blue Classic Foam, 4x4 in 1 x Per Week/30 Days ary Discharge Instructions: Moisten with  saline prior to applying to wound bed Prim Dressing: Santyl Ointment 1 x Per Week/30 Days ary Discharge Instructions: Apply nickel thick amount to wound bed as instructed Secondary Dressing: Woven Gauze Sponges 2x2 in 1 x Per Week/30 Days Discharge Instructions: Apply over primary dressing as directed. Secondary Dressing: Zetuvit Plus 4x8 in 1 x Per Week/30 Days Discharge Instructions: Apply over primary dressing as directed. Com pression Wrap: Kerlix Roll 4.5x3.1 (in/yd) 1 x Per Week/30 Days Discharge Instructions: Apply Kerlix and Coban compression as directed. Com pression Wrap: Coban Self-Adherent Wrap 4x5 (in/yd) 1 x Per Week/30 Days Discharge Instructions: Apply over Kerlix as directed. 1. In office sharp debridement 2. Hydrofera Blue and Santyl under Kerlix/Cobanooright lower extremity 3. Medihoney and silver alginate under compression stockingooleft lower extremity 4. Follow-up next week for nurse visit 5. Follow-up in 2 weeks for physician visit Electronic Signature(s) Signed: 08/17/2022 2:03:41 PM By: Kalman Shan DO Entered By: Kalman Shan on 08/17/2022 09:48:59 -------------------------------------------------------------------------------- HxROS Details Patient Name: Date of Service: [name], [place] RO [name] 08/17/2022 9:15 A M Medical Record Number: [id number] Patient Account Number: [id number] Date of  Birth/Sex: Treating RN: [birth year] (77 y.o. [name], [name] Primary Care Provider: [name] Other Clinician: Referring Provider: Treating Provider/Extender: Hollie Beach in Treatment: 12 Information Obtained From Patient Eyes Medical History: Positive for: Cataracts Negative for: Glaucoma Ear/Nose/Mouth/Throat Medical History: Negative for: Chronic sinus problems/congestion; Middle ear problems Hematologic/Lymphatic Medical History: Positive for: Anemia Negative for: Hemophilia; Human Immunodeficiency Virus; Lymphedema;  Sickle Cell Disease JADEA, SHIFFER ([id number]) [id number] Page 9 of 10 Respiratory Medical History: Negative for: Aspiration; Asthma; Chronic Obstructive Pulmonary Disease (COPD); Pneumothorax; Sleep Apnea; Tuberculosis Cardiovascular Medical History: Positive for: Congestive Heart Failure; Hypertension Negative for: Angina; Arrhythmia; Coronary Artery Disease; Deep Vein Thrombosis; Hypotension; Myocardial Infarction; Peripheral Arterial Disease; Peripheral Venous Disease; Phlebitis; Vasculitis Gastrointestinal Medical History: Negative for: Cirrhosis ; Colitis; Crohns; Hepatitis A; Hepatitis B; Hepatitis C Past Medical History Notes: diverticulosis hiatal hernia hyperlipidemia Endocrine Medical History: Negative for: Type I Diabetes; Type II Diabetes Genitourinary Medical History: Negative for: End Stage Renal Disease Immunological Medical History: Negative for: Lupus Erythematosus; Raynauds; Scleroderma Integumentary (Skin) Medical History: Negative for: History of Burn Musculoskeletal Medical History: Positive for: Osteoarthritis Negative for: Gout; Rheumatoid Arthritis; Osteomyelitis Past Medical History Notes: DJD Neurologic Medical History: Negative for: Dementia; Neuropathy; Quadriplegia; Paraplegia; Seizure Disorder Oncologic Medical History: Negative for: Received Chemotherapy Psychiatric Medical History: Negative for: Anorexia/bulimia; Confinement Anxiety HBO Extended History Items Eyes: Cataracts Immunizations Pneumococcal Vaccine: Received Pneumococcal Vaccination: Yes Received Pneumococcal Vaccination On or After 60th Birthday: Yes Implantable Devices None Hospitalization / Surgery History Type of Hospitalization/Surgery BREON, DISS ([id number]) [id number] Page 10 of 10 right total knee replacement Family and Social History Cancer: Yes - Siblings; Diabetes: Yes - Siblings; Heart  Disease: Yes - Mother; Hereditary Spherocytosis: No; Hypertension: Yes - Mother; Kidney Disease: No; Lung Disease: No; Seizures: No; Stroke: No; Thyroid Problems: No; Tuberculosis: No; Never smoker; Marital Status - Married; Alcohol Use: Never; Drug Use: No History; Caffeine Use: Moderate; Financial Concerns: No; Food, Clothing or Shelter Needs: No; Support System Lacking: No; Transportation Concerns: No Electronic Signature(s) Signed: 08/17/2022 2:03:41 PM By: Kalman Shan DO Signed: 10/21/2022 5:36:43 PM By: Rhae Hammock RN Entered By: Kalman Shan on 08/17/2022 09:46:04 -------------------------------------------------------------------------------- SuperBill Details Patient Name: Date of Service: [name], [place] RO [name] 08/17/2022 Medical Record Number: [id number] Patient Account Number: [id number] Date of Birth/Sex: Treating RN: Aug 07, 1946 (77 y.o. 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 I87.313 Chronic venous hypertension (idiopathic) with ulcer of bilateral lower extremity E11.622 Type 2 diabetes mellitus with other skin ulcer T79.8XXA Other early complications of trauma, initial encounter S81.801A Unspecified open wound, right lower leg, initial encounter S81.802A Unspecified open wound, left lower leg, initial encounter Facility Procedures : CPT4 Code: [id number] Description: 99213 - WOUND CARE VISIT-LEV 3 EST PT Modifier: Quantity: 1 : CPT4 Code: [id number] Description: 10272 - DEB SUBQ TISSUE 20 SQ CM/< ICD-10 Diagnosis Description L97.812 Non-pressure chronic ulcer of other part of right lower leg with fat  layer expos Modifier: ed Quantity: 1 Physician Procedures : CPT4 Code Description Modifier [id number] 99213 - WC PHYS LEVEL 3 - EST PT ICD-10 Diagnosis Description L97.822 Non-pressure chronic ulcer of other part of left lower leg with fat layer exposed I87.313 Chronic venous hypertension (idiopathic) with  ulcer  of bilateral lower extremity E11.622 Type 2 diabetes mellitus with other skin ulcer S81.802A Unspecified open wound, left lower leg, initial encounter Quantity: 1 : [id number] 45409 - WC PHYS SUBQ TISS 20 SQ CM ICD-10 Diagnosis Description L97.812 Non-pressure chronic ulcer of other part of right lower leg with fat layer exposed Quantity: 1 Electronic Signature(s) Signed: 08/17/2022 2:03:41 PM By: Kalman Shan DO Entered By: Kalman Shan on 08/17/2022 09:49:47

## 2022-10-22 NOTE — Progress Notes (Signed)
Brandy, [name] ([id number]) [id number] Page 1 of 10 Visit Report for 08/17/2022 Arrival Information Details Patient Name: Date of Service: [name] 08/17/2022 9:15 A M Medical Record Number: [id number] Patient Account Number: [id number] Date of Birth/Sex: Treating RN: [birth year] (77 y.o. [name], [name] Primary Care Deagen Krass: [name] Other Clinician: Referring Metta Koranda: Treating Kadeen Sroka/Extender: Hollie Beach in Treatment: 12 Visit Information History Since Last Visit Added or deleted any medications: No Patient Arrived: [name] Any new allergies or adverse reactions: No Arrival Time: 09:08 Had a fall or experienced change in No Accompanied By: son activities of daily living that may affect Transfer Assistance: None risk of falls: Patient Identification Verified: Yes Signs or symptoms of abuse/neglect since last visito No Secondary Verification Process Completed: Yes Hospitalized since last visit: No Patient Requires Transmission-Based Precautions: No Implantable device outside of the clinic excluding No Patient Has Alerts: No cellular tissue based products placed in the center since last visit: Has Compression in Place as Prescribed: No Pain Present Now: No Electronic Signature(s) Signed: 08/17/2022 4:23:13 PM By: [name] Entered By: [name] on 08/17/2022 09:09:00 -------------------------------------------------------------------------------- Clinic Level of Care Assessment Details Patient Name: Date of Service: [name] [name] 08/17/2022 9:15 A M Medical Record Number: [id number] Patient Account Number: [id number] Date of Birth/Sex: Treating RN: 12-Jul-1946 (77 y.o. [name], [name] Primary Care Randolf Sansoucie: [name] Other Clinician: Referring Emili Mcloughlin: Treating Burleigh Brockmann/Extender: Hollie Beach in Treatment: 12 Clinic Level of Care Assessment Items TOOL  4 Quantity Score X- 1 0 Use when only an EandM is performed on FOLLOW-UP visit ASSESSMENTS - Nursing Assessment / Reassessment X- 1 10 Reassessment of Co-morbidities (includes updates in patient status) X- 1 5 Reassessment of Adherence to Treatment Plan ASSESSMENTS - Wound and Skin A ssessment / Reassessment X - Simple Wound Assessment / Reassessment - one wound 1 5 _0  - 0 Complex Wound Assessment / Reassessment - multiple wounds _1  - 0 Dermatologic / Skin Assessment (not related to wound area) ASSESSMENTS - Focused Assessment X- 1 5 Circumferential Edema Measurements - multi extremities _2  - 0 Nutritional Assessment / Counseling / Intervention Brandy, [name] ([id number]) [id number] Page 2 of 10 _3  - 0 Lower Extremity Assessment (monofilament, tuning fork, pulses) _4  - 0 Peripheral Arterial Disease Assessment (using hand held doppler) ASSESSMENTS - Ostomy and/or Continence Assessment and Care _5  - 0 Incontinence Assessment and Management _6  - 0 Ostomy Care Assessment and Management (repouching, etc.) PROCESS - Coordination of Care X - Simple Patient / Family Education for ongoing care 1 15 _7  - 0 Complex (extensive) Patient / Family Education for ongoing care X- 1 10 Staff obtains Programmer, systems, Records, T Results / Process Orders est _8  - 0 Staff telephones HHA, Nursing Homes / Clarify orders / etc _9  - 0 Routine Transfer to another Facility (non-emergent condition) _10  - 0 Routine Hospital Admission (non-emergent condition) _11  - 0 New Admissions / Biomedical engineer / Ordering NPWT Apligraf, etc. , _12  - 0 Emergency Hospital Admission (emergent condition) X- 1 10 Simple Discharge Coordination _13  - 0 Complex (extensive) Discharge Coordination PROCESS - Special Needs _14  - 0 Pediatric / Minor Patient Management _15  - 0 Isolation Patient Management _16  - 0 Hearing / Language / Visual special needs _17  - 0 Assessment of Community  assistance (transportation, D/C planning, etc.) _18  - 0 Additional assistance / Altered mentation _19  - 0 Support Surface(s) Assessment (bed, cushion, seat, etc.) INTERVENTIONS - Wound Cleansing / Measurement X -  Simple Wound Cleansing - one wound 1 5 _0  - 0 Complex Wound Cleansing - multiple wounds X- 1 5 Wound Imaging (photographs - any number of wounds) _1  - 0 Wound Tracing (instead of photographs) X- 1 5 Simple Wound Measurement - one wound _2  - 0 Complex Wound Measurement - multiple wounds INTERVENTIONS - Wound Dressings X - Small Wound Dressing one or multiple wounds 1 10 _3  - 0 Medium Wound Dressing one or multiple wounds _4  - 0 Large Wound Dressing one or multiple wounds X- 1 5 Application of Medications - topical <GEZMOQHUTMLYYTKP>_5<\/WSFKCLEXNTZGYFVC>_9  - 0 Application of Medications - injection INTERVENTIONS - Miscellaneous _6  - 0 External ear exam _7  - 0 Specimen Collection (cultures, biopsies, blood, body fluids, etc.) _8  - 0 Specimen(s) / Culture(s) sent or taken to Lab for analysis _9  - 0 Patient Transfer (multiple staff / Civil Service fast streamer / Similar devices) _10  - 0 Simple Staple / Suture removal (25 or less) _11  - 0 Complex Staple / Suture removal (26 or more) _12  - 0 Hypo / Hyperglycemic Management (close monitor of Blood Glucose) QUINTASHA, GREN B ([id number]) [id number] Page 3 of 10 _13  - 0 Ankle / Brachial Index (ABI) - do not check if billed separately X- 1 5 Vital Signs Has the patient been seen at the hospital within the last three years: Yes Total Score: 95 Level Of Care: New/Established - Level 3 Electronic Signature(s) Signed: 10/21/2022 5:36:43 PM By: Rhae Hammock RN Entered By: Rhae Hammock on 08/17/2022 09:37:04 -------------------------------------------------------------------------------- Encounter Discharge Information Details Patient Name: Date of Service: [name], [place] RO [name] 08/17/2022 9:15 A M Medical Record Number: [id number] Patient  Account Number: [id number] Date of Birth/Sex: Treating RN: 1946/03/15 (77 y.o. [name], [name] Primary Care Dewaun Kinzler: [name] Other Clinician: Referring Kyler Lerette: Treating Johncarlo Maalouf/Extender: Hollie Beach in Treatment: 12 Encounter Discharge Information Items Post Procedure Vitals Discharge Condition: Stable Temperature (F): 98.7 Ambulatory Status: Wheelchair Pulse (bpm): 74 Discharge Destination: Home Respiratory Rate (breaths/min): 17 Transportation: Private Auto Blood Pressure (mmHg): 120/80 Accompanied By: self Schedule Follow-up Appointment: Yes Clinical Summary of Care: Patient Declined Electronic Signature(s) Signed: 10/21/2022 5:36:43 PM By: Rhae Hammock RN Entered By: Rhae Hammock on 08/17/2022 11:04:08 -------------------------------------------------------------------------------- Lower Extremity Assessment Details Patient Name: Date of Service: [name] RO [name] 08/17/2022 9:15 A M Medical Record Number: [id number] Patient Account Number: [id number] Date of Birth/Sex: Treating RN: 1946/10/12 (77 y.o. [name], [name] Primary Care Arlynn Stare: [name] Other Clinician: Referring Alexiya Franqui: Treating Mahum Betten/Extender: Hollie Beach in Treatment: 12 Edema Assessment Assessed: [name]: No] Patrice Paradise: No] Edema: [Left: Yes] [Right: Yes] Calf Left: Right: Point of Measurement: 41 cm From Medial Instep 47.3 cm 46.5 cm Ankle Left: Right: Point of Measurement: 8 cm From Medial Instep 25.5 cm 26 cm Vascular Assessment SHAQUAYLA, KLIMAS B ([id number]) [[id number] Page 4 of 10] Pulses: Dorsalis Pedis Palpable: [Left:Yes] [Right:Yes] Electronic Signature(s) Signed: 08/17/2022 4:23:13 PM By: [name] Signed: 10/21/2022 5:36:43 PM By: Rhae Hammock RN Entered By: [name] on 08/17/2022  09:22:23 -------------------------------------------------------------------------------- Multi Wound Chart Details Patient Name: Date of Service: [name], [place] RO [name] 08/17/2022 9:15 A M Medical Record Number: [id number] Patient Account Number: [id number] Date of Birth/Sex: Treating RN: 02-08-1946 (77 y.o. [name], [name] Primary Care Twanisha Foulk: [name] Other Clinician: Referring Kaikoa Magro: Treating Breasia Karges/Extender: Hollie Beach in Treatment: 12 Vital Signs Height(in): 63 Pulse(bpm): 78 Weight(lbs): Blood Pressure(mmHg): 146/79 Body Mass Index(BMI): Temperature(F): 98.2 Respiratory Rate(breaths/min): 18 [2:Photos:] [N/A:N/A] Left, Lateral Upper  Leg Right, Lateral Lower Leg N/A Wound Location: Trauma Trauma N/A Wounding Event: Abrasion Trauma, Other N/A Primary Etiology: Lymphedema N/A N/A Secondary Etiology: Cataracts, Anemia, Congestive Heart Cataracts, Anemia, Congestive Heart N/A Comorbid History: Failure, Hypertension, Osteoarthritis Failure, Hypertension, Osteoarthritis 06/20/2022 06/20/2022 N/A Date Acquired: 7 7 N/A Weeks of Treatment: Open Open N/A Wound Status: No No N/A Wound Recurrence: 0.2x0.6x0.1 2x2.4x0.4 N/A Measurements L x W x D (cm) 0.094 3.77 N/A A (cm) : rea 0.009 1.508 N/A Volume (cm) : 99.30% 90.40% N/A % Reduction in A rea: 99.70% 80.80% N/A % Reduction in Volume: Full Thickness With Exposed Support Full Thickness With Exposed Support N/A Classification: Structures Structures Medium Medium N/A Exudate A mount: Serosanguineous Serosanguineous N/A Exudate Type: red, brown red, brown N/A Exudate Color: Distinct, outline attached Distinct, outline attached N/A Wound Margin: Large (67-100%) Large (67-100%) N/A Granulation A mount: Red, Pink Red, Pink, Hyper-granulation N/A Granulation Quality: None Present (0%) Small (1-33%) N/A Necrotic A mount: Fat Layer (Subcutaneous Tissue): Yes Fat Layer  (Subcutaneous Tissue): Yes N/A Exposed Structures: Fascia: No Fascia: No Tendon: No Tendon: No Muscle: No Muscle: No Joint: No Joint: No Bone: No Bone: No Medium (34-66%) Small (1-33%) N/A Epithelialization: N/A Debridement - Excisional N/A Debridement: Pre-procedure Verification/Time Out N/A 09:35 N/A Taken: Thomasene Mohair ([id number]) [id number] Page 5 of 10 N/A Lidocaine N/A Pain Control: N/A Subcutaneous, Slough N/A Tissue Debrided: N/A Skin/Subcutaneous Tissue N/A Level: N/A 4.8 N/A Debridement A (sq cm): rea N/A Curette N/A Instrument: N/A Minimum N/A Bleeding: N/A Pressure N/A Hemostasis A chieved: N/A 0 N/A Procedural Pain: N/A 0 N/A Post Procedural Pain: N/A Procedure was tolerated well N/A Debridement Treatment Response: N/A 2x2.4x0.4 N/A Post Debridement Measurements L x W x D (cm) N/A 1.508 N/A Post Debridement Volume: (cm) Excoriation: No Excoriation: No N/A Periwound Skin Texture: Induration: No Induration: No Callus: No Callus: No Crepitus: No Crepitus: No Rash: No Rash: No Scarring: No Scarring: No Maceration: No Maceration: No N/A Periwound Skin Moisture: Dry/Scaly: No Dry/Scaly: No Hemosiderin Staining: Yes Hemosiderin Staining: Yes N/A Periwound Skin Color: Atrophie Blanche: No Atrophie Blanche: No Cyanosis: No Cyanosis: No Ecchymosis: No Ecchymosis: No Erythema: No Erythema: No Mottled: No Mottled: No Pallor: No Pallor: No Rubor: No Rubor: No No Abnormality No Abnormality N/A Temperature: N/A Debridement N/A Procedures Performed: Treatment Notes Electronic Signature(s) Signed: 08/17/2022 2:03:41 PM By: Kalman Shan DO Signed: 10/21/2022 5:36:43 PM By: Rhae Hammock RN Entered By: Kalman Shan on 08/17/2022 09:45:19 -------------------------------------------------------------------------------- Multi-Disciplinary Care Plan Details Patient Name: Date of Service: [name], [place] RO [name] 08/17/2022 9:15 A M Medical Record Number: [id number] Patient Account Number: [id number] Date of Birth/Sex: Treating RN: [birth year] (77 y.o. [name], [name] Primary Care Chassity Ludke: [name] Other Clinician: Referring Talen Poser: Treating Clydette Privitera/Extender: Hollie Beach in Treatment: 12 Active Inactive Pain, Acute or Chronic Nursing Diagnoses: Pain, acute or chronic: actual or potential Potential alteration in comfort, pain Goals: Patient will verbalize adequate pain control and receive pain control interventions during procedures as needed Date Initiated: 05/25/2022 Target Resolution Date: 09/19/2022 Goal Status: Active Patient/caregiver will verbalize comfort level met Date Initiated: 05/25/2022 Target Resolution Date: 09/19/2022 Goal Status: Active Interventions: Complete pain assessment as per visit requirements Encourage patient to take pain medications as prescribed Provide education on pain management JILLIENNE, EGNER ([id number]) [id number] Page 6 of 10 Treatment Activities: Administer pain control measures as ordered : 05/25/2022 Notes: Venous Leg Ulcer Nursing Diagnoses: Knowledge deficit related to disease process and management Goals: Non-invasive  venous studies are completed as ordered Date Initiated: 05/25/2022 Target Resolution Date: 09/19/2022 Goal Status: Active Interventions: Assess peripheral edema status every visit. Provide education on venous insufficiency Treatment Activities: Non-invasive vascular studies : 05/25/2022 Notes: Wound/Skin Impairment Nursing Diagnoses: Knowledge deficit related to ulceration/compromised skin integrity Goals: Patient/caregiver will verbalize understanding of skin care regimen Date Initiated: 05/25/2022 Target Resolution Date: 09/19/2022 Goal Status: Active Ulcer/skin breakdown will heal within 14 weeks Date Initiated: 05/25/2022 Target Resolution Date:  10/17/2022 Goal Status: Active Interventions: Assess patient/caregiver ability to obtain necessary supplies Assess patient/caregiver ability to perform ulcer/skin care regimen upon admission and as needed Provide education on ulcer and skin care Treatment Activities: Skin care regimen initiated : 05/25/2022 Topical wound management initiated : 05/25/2022 Notes: Electronic Signature(s) Signed: 10/21/2022 5:36:43 PM By: Rhae Hammock RN Entered By: Rhae Hammock on 08/17/2022 09:11:18 -------------------------------------------------------------------------------- Pain Assessment Details Patient Name: Date of Service: [name] RO [name] 08/17/2022 9:15 A M Medical Record Number: [id number] Patient Account Number: [id number] Date of Birth/Sex: Treating RN: 03/31/1946 (77 y.o. [name], [name] Primary Care Montarius Kitagawa: [name] Other Clinician: Referring Shalisa Mcquade: Treating Jaryn Hocutt/Extender: Hollie Beach in Treatment: 12 Active Problems Location of Pain Severity and Description of Pain Patient Has Paino No Site Locations SHALANDA, BROGDEN B ([id number]) [id number] Page 7 of 10 Pain Management and Medication Current Pain Management: Electronic Signature(s) Signed: 08/17/2022 4:23:13 PM By: [name] Signed: 10/21/2022 5:36:43 PM By: Rhae Hammock RN Entered By: [name] on 08/17/2022 09:10:40 -------------------------------------------------------------------------------- Patient/Caregiver Education Details Patient Name: Date of Service: [name] RO [name] 10/30/2023andnbsp9:15 A M Medical Record Number: [id number] Patient Account Number: [id number] Date of Birth/Gender: Treating RN: April 29, 1946 (77 y.o. [name], [name] Primary Care Physician: [name] Other Clinician: Referring Physician: Treating Physician/Extender: Hollie Beach in Treatment: 12 Education Assessment Education  Provided To: Patient Education Topics Provided Pain: Methods: Explain/Verbal Responses: State content correctly [employer]) Signed: 10/21/2022 5:36:43 PM By: Rhae Hammock RN Entered By: Rhae Hammock on 08/17/2022 09:11:41 -------------------------------------------------------------------------------- Wound Assessment Details Patient Name: Date of Service: [name] RO [name] 08/17/2022 9:15 A M Medical Record Number: [id number] Patient Account Number: [id number] Date of Birth/Sex: Treating RN: 01/21/1946 (77 y.o. Benjaman Lobe Primary Care Ferrah Panagopoulos: [name] Other Clinician: Thomasene Mohair ([id number]) [id number] Page 8 of 10 Referring Meliton Samad: Treating Travers Goodley/Extender: Hollie Beach in Treatment: 12 Wound Status Wound Number: 2 Primary Abrasion Etiology: Wound Location: Left, Lateral Upper Leg Secondary Lymphedema Wounding Event: Trauma Etiology: Date Acquired: 06/20/2022 Wound Status: Open Weeks Of Treatment: 7 Comorbid Cataracts, Anemia, Congestive Heart Failure, Hypertension, Clustered Wound: No History: Osteoarthritis Photos Wound Measurements Length: (cm) 0.2 Width: (cm) 0.6 Depth: (cm) 0.1 Area: (cm) 0.094 Volume: (cm) 0.009 % Reduction in Area: 99.3% % Reduction in Volume: 99.7% Epithelialization: Medium (34-66%) Wound Description Classification: Full Thickness With Exposed Suppo Wound Margin: Distinct, outline attached Exudate Amount: Medium Exudate Type: Serosanguineous Exudate Color: red, brown rt Structures Foul Odor After Cleansing: No Slough/Fibrino No Wound Bed Granulation Amount: Large (67-100%) Exposed Structure Granulation Quality: Red, Pink Fascia Exposed: No Necrotic Amount: None Present (0%) Fat Layer (Subcutaneous Tissue) Exposed: Yes Tendon Exposed: No Muscle Exposed: No Joint Exposed: No Bone Exposed: No Periwound Skin Texture Texture Color No  Abnormalities Noted: Yes No Abnormalities Noted: Yes Moisture Temperature / Pain No Abnormalities Noted: Yes Temperature: No Abnormality Electronic Signature(s) Signed: 08/17/2022 4:23:13 PM By: [name] Signed: 10/21/2022 5:36:43 PM By: Rhae Hammock RN Entered By:  [name] on 08/17/2022 09:20:26 -------------------------------------------------------------------------------- Wound Assessment Details Patient Name: Date of Service: [name] 08/17/2022 9:15 A M Medical Record Number: [id number] Patient Account Number: [id number] Date of Birth/Sex: Treating RN: Dec 14, 1945 (77 y.o. [name], Lajuana Ripple, Lanora Manis ([id number]) [id number] Page 9 of 10 Primary Care Caleb Prigmore: [name] Other Clinician: Referring Jaylyn Iyer: Treating Deavion Strider/Extender: Hollie Beach in Treatment: 12 Wound Status Wound Number: 3 Primary Trauma, Other Etiology: Wound Location: Right, Lateral Lower Leg Wound Status: Open Wounding Event: Trauma Comorbid Cataracts, Anemia, Congestive Heart Failure, Hypertension, Date Acquired: 06/20/2022 History: Osteoarthritis Weeks Of Treatment: 7 Clustered Wound: No Photos Wound Measurements Length: (cm) 2 Width: (cm) 2.4 Depth: (cm) 0.4 Area: (cm) 3.77 Volume: (cm) 1.508 % Reduction in Area: 90.4% % Reduction in Volume: 80.8% Epithelialization: Small (1-33%) Tunneling: No Undermining: No Wound Description Classification: Full Thickness With Exposed Suppo Wound Margin: Distinct, outline attached Exudate Amount: Medium Exudate Type: Serosanguineous Exudate Color: red, brown rt Structures Foul Odor After Cleansing: No Slough/Fibrino Yes Wound Bed Granulation Amount: Large (67-100%) Exposed Structure Granulation Quality: Red, Pink, Hyper-granulation Fascia Exposed: No Necrotic Amount: Small (1-33%) Fat Layer (Subcutaneous Tissue) Exposed: Yes Necrotic Quality: Adherent  Slough Tendon Exposed: No Muscle Exposed: No Joint Exposed: No Bone Exposed: No Periwound Skin Texture Texture Color No Abnormalities Noted: Yes No Abnormalities Noted: No Atrophie Blanche: No Moisture Cyanosis: No No Abnormalities Noted: Yes Ecchymosis: No Erythema: No Hemosiderin Staining: Yes Mottled: No Pallor: No Rubor: No Temperature / Pain Temperature: No Abnormality Electronic Signature(s) Signed: 08/17/2022 4:23:13 PM By: [name] Signed: 10/21/2022 5:36:43 PM By: Rhae Hammock RN Entered By: [name] on 08/17/2022 09:21:47 Thomasene Mohair ([id number]) [id number] Page 10 of 10 -------------------------------------------------------------------------------- Vitals Details Patient Name: Date of Service: [name] [name] 08/17/2022 9:15 A M Medical Record Number: [id number] Patient Account Number: [id number] Date of Birth/Sex: Treating RN: [birth year] (77 y.o. [name], [name] Primary Care Audrina Marten: [name] Other Clinician: Referring Nixon Sparr: Treating Calvyn Kurtzman/Extender: Hollie Beach in Treatment: 12 Vital Signs Time Taken: 09:09 Temperature (F): 98.2 Height (in): 63 Pulse (bpm): 78 Respiratory Rate (breaths/min): 18 Blood Pressure (mmHg): 146/79 Reference Range: 80 - 120 mg / dl Electronic Signature(s) Signed: 08/17/2022 4:23:13 PM By: [name] Entered By: [name] on 08/17/2022 09:10:34

## 2022-10-23 ENCOUNTER — Ambulatory Visit (INDEPENDENT_AMBULATORY_CARE_PROVIDER_SITE_OTHER): Payer: Medicare Other

## 2022-10-23 VITALS — Ht 63.0 in | Wt 219.0 lb

## 2022-10-23 DIAGNOSIS — Z0000 Encounter for general adult medical examination without abnormal findings: Secondary | ICD-10-CM

## 2022-10-23 NOTE — Progress Notes (Signed)
Virtual Visit via Telephone Note  I connected with  [name] on 10/23/22 at  8:45 AM EST by telephone and verified that I am speaking with the correct person using two identifiers.  Location: Patient: Home Provider: [place] Persons participating in the virtual visit: [place]   I discussed the limitations, risks, security and privacy concerns of performing an evaluation and management service by telephone and the availability of in person appointments. The patient expressed understanding and agreed to proceed.  Interactive audio and video telecommunications were attempted between this nurse and patient, however failed, due to patient having technical difficulties OR patient did not have access to video capability.  We continued and completed visit with audio only.  Some vital signs may be absent or patient reported.   Sheral Flow, LPN  Subjective:   [name] is a 77 y.o. female who presents for Medicare Annual (Subsequent) preventive examination.  Review of Systems     Cardiac Risk Factors include: advanced age (>33mn, >>48women);dyslipidemia;family history of premature cardiovascular disease;hypertension;obesity (BMI >30kg/m2);sedentary lifestyle     Objective:    Today's Vitals   10/23/22 0846  Weight: 219 lb (99.3 kg)  Height: '5\' 3"'$  (1.6 m)  PainSc: 0-No pain   Body mass index is 38.79 kg/m.     10/23/2022    8:48 AM 06/20/2022    3:44 PM 10/22/2021    8:44 AM 09/09/2020    9:06 AM 03/26/2020    1:36 PM 04/14/2019    4:15 PM 04/12/2019    9:39 AM  Advanced Directives  Does Patient Have a Medical Advance Directive? No No No No No No No  Would patient like information on creating a medical advance directive? No - Patient declined No - Patient declined No - Patient declined No - Patient declined No - Patient declined No - Patient declined No - Patient declined    Current Medications (verified) Outpatient  Encounter Medications as of 10/23/2022  Medication Sig   aspirin-acetaminophen-caffeine (EXCEDRIN MIGRAINE) 250-250-65 MG tablet Take 1 tablet by mouth every 6 (six) hours as needed for headache.   atorvastatin (LIPITOR) 40 MG tablet TAKE 1 TABLET EVERY DAY   azelastine (ASTELIN) 0.1 % nasal spray Place 1 spray into both nostrils 2 (two) times daily. Use in each nostril as directed   azelastine (OPTIVAR) 0.05 % ophthalmic solution Place 1 drop into both eyes 2 (two) times daily.   betamethasone, augmented, (DIPROLENE) 0.05 % lotion Apply topically daily as needed.   cetirizine (ZYRTEC) 10 MG tablet Take 1 tablet (10 mg total) by mouth daily. As needed (Patient taking differently: Take 10 mg by mouth daily as needed for allergies. As needed)   Cholecalciferol 50 MCG (2000 UT) TABS 1 tab by mouth once daily   diltiazem (DILACOR XR) 240 MG 24 hr capsule TAKE 1 CAPSULE EVERY DAY   DULoxetine (CYMBALTA) 20 MG capsule TAKE 2 CAPSULES EVERY DAY   ferrous sulfate 325 (65 FE) MG tablet Take 1 tablet (325 mg total) by mouth 2 (two) times daily with a meal.   furosemide (LASIX) 80 MG tablet TAKE 1 TABLET TWICE DAILY   gabapentin (NEURONTIN) 100 MG capsule TAKE 2 CAPSULES AT BEDTIME   HYDROcodone-acetaminophen (NORCO/VICODIN) 5-325 MG tablet Take 0.5 tablets by mouth daily as needed.   hydrOXYzine (ATARAX) 10 MG tablet Take 1 tablet (10 mg total) by mouth at bedtime as needed for itching.   losartan (COZAAR) 100 MG tablet Take 1 tablet (100  mg total) by mouth daily. Annual appt due in Sept must see provider for future refills   metoprolol tartrate (LOPRESSOR) 50 MG tablet TAKE 1 TABLET TWICE DAILY   ondansetron (ZOFRAN) 4 MG tablet Take 1 tablet (4 mg total) by mouth every 8 (eight) hours as needed for nausea or vomiting.   pantoprazole (PROTONIX) 40 MG tablet TAKE 1 TABLET(40 MG) BY MOUTH DAILY   Polyethyl Glycol-Propyl Glycol (SYSTANE OP) Place 1 drop into both eyes 3 (three) times daily as needed (dry  eyes).   Polyethylene Glycol 3350 (MIRALAX PO) Take 17 g by mouth daily as needed (constipation).    potassium chloride (KLOR-CON M) 10 MEQ tablet TAKE 1 TABLET EVERY DAY   promethazine-dextromethorphan (PROMETHAZINE-DM) 6.25-15 MG/5ML syrup Take 5 mLs by mouth 4 (four) times daily as needed for cough.   tiZANidine (ZANAFLEX) 2 MG tablet TAKE 1 TABLET EVERY 6 HOURS AS NEEDED FOR MUSCLE SPASMS   triamcinolone (NASACORT) 55 MCG/ACT AERO nasal inhaler Place 2 sprays into the nose daily.   triamcinolone cream (KENALOG) 0.1 % Apply 1 application topically 2 (two) times daily.   vitamin B-12 (CYANOCOBALAMIN) 1000 MCG tablet Take 1,000 mcg by mouth daily.   No facility-administered encounter medications on file as of 10/23/2022.    Allergies (verified) Doxycycline, Oxycodone, Soybean-containing drug products, and Sulfonamide derivatives   History: Past Medical History:  Diagnosis Date   Allergic rhinitis, cause unspecified    Allergy    SEASONAL   Anemia    Cataract    Bilateral   CHF (congestive heart failure) (HCC)    Chronic headaches    Constipation    Diverticulosis    DJD (degenerative joint disease), lumbar    GERD (gastroesophageal reflux disease)    Hemorrhoids    Hiatal hernia    HTN (hypertension)    Hyperlipidemia    Obesity    Osteoarthritis    Tubular adenoma of colon    Past Surgical History:  Procedure Laterality Date   ABLATION     uterus   COLONOSCOPY  05/15/2021   [place]   TOTAL KNEE ARTHROPLASTY Right 04/14/2019   Procedure: TOTAL KNEE ARTHROPLASTY;  Surgeon: Dorna Leitz, MD;  Location: [facility];  Service: Orthopedics;  Laterality: Right;   TUBAL LIGATION     Family History  Problem Relation Age of Onset   Heart disease Mother    Hypertension Mother    Breast cancer Sister    Diabetes Brother    Colon cancer Neg Hx    Esophageal cancer Neg Hx    Rectal cancer Neg Hx    Stomach cancer Neg Hx    Colon polyps Neg Hx    Social History    Socioeconomic History   Marital status: Widowed    Spouse name: Not on file   Number of children: 3   Years of education: 39   Highest education level: Not on file  Occupational History   Occupation: retired    Fish farm manager: UNEMPLOYED  Tobacco Use   Smoking status: Never    Passive exposure: Never   Smokeless tobacco: Never  Vaping Use   Vaping Use: Never used  Substance and Sexual Activity   Alcohol use: No    Alcohol/week: 0.0 standard drinks of alcohol   Drug use: No   Sexual activity: Not Currently    Birth control/protection: Post-menopausal  Other Topics Concern   Not on file  Social History Narrative   Not on file   Social Determinants of Health  Financial Resource Strain: Low Risk  (10/23/2022)   Overall Financial Resource Strain (CARDIA)    Difficulty of Paying Living Expenses: Not hard at all  Food Insecurity: No Food Insecurity (10/23/2022)   Hunger Vital Sign    Worried About Running Out of Food in the Last Year: Never true    Ran Out of Food in the Last Year: Never true  Transportation Needs: No Transportation Needs (10/23/2022)   PRAPARE - Hydrologist (Medical): No    Lack of Transportation (Non-Medical): No  Physical Activity: Inactive (10/23/2022)   Exercise Vital Sign    Days of Exercise per Week: 0 days    Minutes of Exercise per Session: 0 min  Stress: No Stress Concern Present (10/23/2022)   [place]    Feeling of Stress : Not at all  Social Connections: Moderately Isolated (10/23/2022)   Social Connection and Isolation Panel [NHANES]    Frequency of Communication with Friends and Family: More than three times a week    Frequency of Social Gatherings with Friends and Family: Once a week    Attends Religious Services: Never    Marine scientist or Organizations: No    Attends Archivist Meetings: 1 to 4 times per year    Marital Status: Widowed     Tobacco Counseling Counseling given: Not Answered   Clinical Intake:  Pre-visit preparation completed: Yes  Pain : No/denies pain Pain Score: 0-No pain     BMI - recorded: 38.79 Nutritional Status: BMI > 30  Obese Nutritional Risks: None Diabetes: No  How often do you need to have someone help you when you read instructions, pamphlets, or other written materials from your doctor or pharmacy?: 1 - Never What is the last grade level you completed in school?: HSG  Diabetic? No  Interpreter Needed?: No  Information entered by :: Lisette Abu, LPN.   Activities of Daily Living    10/23/2022    8:51 AM  In your present state of health, do you have any difficulty performing the following activities:  Hearing? 0  Vision? 0  Difficulty concentrating or making decisions? 0  Walking or climbing stairs? 0  Dressing or bathing? 0  Doing errands, shopping? 0  Preparing Food and eating ? N  Using the Toilet? N  In the past six months, have you accidently leaked urine? Y  Do you have problems with loss of bowel control? N  Managing your Medications? N  Managing your Finances? N  Housekeeping or managing your Housekeeping? N    Patient Care Team: Biagio Borg, MD as PCP - General (Internal Medicine) Szabat, Darnelle Maffucci, [facility] (Inactive) as Pharmacist (Pharmacist) Warden Fillers, MD as Consulting Physician (Ophthalmology)  Indicate any recent Medical Services you may have received from other than [facility] providers in the past year (date may be approximate).     Assessment:   This is a routine wellness examination for Gooding.  Hearing/Vision screen Hearing Screening - Comments:: Denies hearing difficulties   Vision Screening - Comments:: Wears rx glasses - up to date with routine eye exams with Warden Fillers, MD.   Dietary issues and exercise activities discussed: Current Exercise Habits: The patient does not participate in regular exercise at present,  Exercise limited by: orthopedic condition(s);respiratory conditions(s)   Goals Addressed             This Visit's Progress    Client understands the  importance of follow-up with providers by attending scheduled visits.        Depression Screen    10/23/2022    8:50 AM 05/15/2022    1:43 PM 10/22/2021    8:59 AM 05/13/2021   11:25 AM 09/09/2020    9:14 AM 08/07/2020   11:03 AM 02/06/2020    9:23 AM  PHQ 2/9 Scores  PHQ - 2 Score 0 0 0 0 1 1 0    Fall Risk    10/23/2022    8:49 AM 05/15/2022    1:43 PM 01/02/2022   10:29 AM 10/22/2021    8:46 AM 05/13/2021   11:25 AM  Fall Risk   Falls in the past year? 1 0 0 0 0  Number falls in past yr: 0  0 0 0  Injury with Fall? 1  0 0 0  Risk for fall due to :    No Fall Risks   Follow up Falls evaluation completed        [place]:  Any stairs in or around the home? No  If so, are there any without handrails? No  Home free of loose throw rugs in walkways, pet beds, electrical cords, etc? Yes  Adequate lighting in your home to reduce risk of falls? Yes   ASSISTIVE DEVICES UTILIZED TO PREVENT FALLS:  Life alert? Yes  Use of a cane, walker or w/c? Yes  Grab bars in the bathroom? Yes  Shower chair or bench in shower? Yes  Elevated toilet seat or a handicapped toilet? No   TIMED UP AND GO:  Was the test performed? No . Phone Visit   Cognitive Function:    05/24/2018    5:53 PM 02/16/2017    9:31 AM  MMSE - Mini Mental State Exam  Orientation to time 5 5  Orientation to Place 5 5  Registration 3 3  Attention/ Calculation 3 3  Recall 2 0  Language- name 2 objects 2 2  Language- repeat 1 1  Language- follow 3 step command 3 3  Language- read & follow direction 1 1  Write a sentence 1 1  Copy design 1 1  Total score 27 25        10/23/2022    8:50 AM  6CIT Screen  What Year? 0 points  What month? 0 points  What time? 0 points  Count back from 20 0 points  Months in reverse 0 points   Repeat phrase 0 points  Total Score 0 points    Immunizations Immunization History  Administered Date(s) Administered   Fluad Quad(high Dose 65+) 07/12/2020, 07/14/2021   Influenza Split 09/30/2012   Influenza, High Dose Seasonal PF 08/01/2013, 08/11/2016, 07/26/2017, 06/24/2018, 07/11/2019   Influenza,inj,Quad PF,6+ Mos 07/26/2015   Moderna Sars-Covid-2 Vaccination 11/25/2019, 12/18/2019, 08/05/2020   Pneumococcal Conjugate-13 03/15/2014   Pneumococcal Polysaccharide-23 01/30/2013   Tdap 01/18/1999, 04/13/2018, 06/20/2022    TDAP status: Up to date  Flu Vaccine status: Due, Education has been provided regarding the importance of this vaccine. Advised may receive this vaccine at local pharmacy or Health Dept. Aware to provide a copy of the vaccination record if obtained from local pharmacy or Health Dept. Verbalized acceptance and understanding.  Pneumococcal vaccine status: Up to date  Covid-19 vaccine status: Completed vaccines  Qualifies for Shingles Vaccine? Yes   Zostavax completed No   Shingrix Completed?: No.    Education has been provided regarding the importance of this vaccine. Patient  has been advised to call insurance company to determine out of pocket expense if they have not yet received this vaccine. Advised may also receive vaccine at local pharmacy or Health Dept. Verbalized acceptance and understanding.  Screening Tests Health Maintenance  Topic Date Due   INFLUENZA VACCINE  05/19/2022   COVID-19 Vaccine (4 - 2023-24 season) 06/19/2022   Medicare Annual Wellness (AWV)  10/24/2023   COLONOSCOPY (Pts 45-34yr Insurance coverage will need to be confirmed)  05/15/2026   DTaP/Tdap/Td (4 - Td or Tdap) 06/20/2032   Pneumonia Vaccine 76 Years old  Completed   DEXA SCAN  Completed   Hepatitis C Screening  Completed   HPV VACCINES  Aged Out   Zoster Vaccines- Shingrix  Discontinued    Health Maintenance  Health Maintenance Due  Topic Date Due   INFLUENZA  VACCINE  05/19/2022   COVID-19 Vaccine (4 - 2023-24 season) 06/19/2022    Colorectal cancer screening: Type of screening: Colonoscopy. Completed 05/15/2021. Repeat every 5 years  Mammogram status: Completed 05/13/2022. Repeat every year  Bone Density status: Completed 01/23/2016. Results reflect: Bone density results: NORMAL. Repeat every 5 years.  Lung Cancer Screening: (Low Dose CT Chest recommended if Age 76-80years, 30 pack-year currently smoking OR have quit w/in 15years.) does not qualify.   Lung Cancer Screening Referral: no  Additional Screening:  Hepatitis C Screening: does qualify; Completed 09/11/2008  Vision Screening: Recommended annual ophthalmology exams for early detection of glaucoma and other disorders of the eye. Is the patient up to date with their annual eye exam?  Yes  Who is the provider or what is the name of the office in which the patient attends annual eye exams? CWarden Fillers MD. If pt is not established with a provider, would they like to be referred to a provider to establish care? No .   Dental Screening: Recommended annual dental exams for proper oral hygiene  Community Resource Referral / Chronic Care Management: CRR required this visit?  No   CCM required this visit?  No      Plan:     I have personally reviewed and noted the following in the patient's chart:   Medical and social history Use of alcohol, tobacco or illicit drugs  Current medications and supplements including opioid prescriptions. Patient is currently taking opioid prescriptions. Information provided to patient regarding non-opioid alternatives. Patient advised to discuss non-opioid treatment plan with their provider. Functional ability and status Nutritional status Physical activity Advanced directives List of other physicians Hospitalizations, surgeries, and ER visits in previous 12 months Vitals Screenings to include cognitive, depression, and falls Referrals and  appointments  In addition, I have reviewed and discussed with patient certain preventive protocols, quality metrics, and best practice recommendations. A written personalized care plan for preventive services as well as general preventive health recommendations were provided to patient.     SSheral Flow LPN   16/11/9474  Nurse Notes: N/A

## 2022-10-23 NOTE — Patient Instructions (Signed)
[name] , Thank you for taking time to come for your Medicare Wellness Visit. I appreciate your ongoing commitment to your health goals. Please review the following plan we discussed and let me know if I can assist you in the future.   These are the goals we discussed:  Goals      Client understands the importance of follow-up with providers by attending scheduled visits.        This is a list of the screening recommended for you and due dates:  Health Maintenance  Topic Date Due   Flu Shot  05/19/2022   COVID-19 Vaccine (4 - 2023-24 season) 06/19/2022   Medicare Annual Wellness Visit  10/24/2023   Colon Cancer Screening  05/15/2026   DTaP/Tdap/Td vaccine (4 - Td or Tdap) 06/20/2032   Pneumonia Vaccine  Completed   DEXA scan (bone density measurement)  Completed   Hepatitis C Screening: USPSTF Recommendation to screen - Ages 76-76 yo.  Completed   HPV Vaccine  Aged Out   Zoster (Shingles) Vaccine  Discontinued    Advanced directives: No  Conditions/risks identified: Yes  Next appointment: Follow up in one year for your annual wellness visit.   Preventive Care 63 Years and Older, Female Preventive care refers to lifestyle choices and visits with your health care provider that can promote health and wellness. What does preventive care include? A yearly physical exam. This is also called an annual well check. Dental exams once or twice a year. Routine eye exams. Ask your health care provider how often you should have your eyes checked. Personal lifestyle choices, including: Daily care of your teeth and gums. Regular physical activity. Eating a healthy diet. Avoiding tobacco and drug use. Limiting alcohol use. Practicing safe sex. Taking low-dose aspirin every day. Taking vitamin and mineral supplements as recommended by your health care provider. What happens during an annual well check? The services and screenings done by your health care provider during your annual  well check will depend on your age, overall health, lifestyle risk factors, and family history of disease. Counseling  Your health care provider may ask you questions about your: Alcohol use. Tobacco use. Drug use. Emotional well-being. Home and relationship well-being. Sexual activity. Eating habits. History of falls. Memory and ability to understand (cognition). Work and work Statistician. Reproductive health. Screening  You may have the following tests or measurements: Height, weight, and BMI. Blood pressure. Lipid and cholesterol levels. These may be checked every 5 years, or more frequently if you are over 58 years old. Skin check. Lung cancer screening. You may have this screening every year starting at age 69 if you have a 30-pack-year history of smoking and currently smoke or have quit within the past 15 years. Fecal occult blood test (FOBT) of the stool. You may have this test every year starting at age 55. Flexible sigmoidoscopy or colonoscopy. You may have a sigmoidoscopy every 5 years or a colonoscopy every 10 years starting at age 22. Hepatitis C blood test. Hepatitis B blood test. Sexually transmitted disease (STD) testing. Diabetes screening. This is done by checking your blood sugar (glucose) after you have not eaten for a while (fasting). You may have this done every 1-3 years. Bone density scan. This is done to screen for osteoporosis. You may have this done starting at age 58. Mammogram. This may be done every 1-2 years. Talk to your health care provider about how often you should have regular mammograms. Talk with your health care provider  about your test results, treatment options, and if necessary, the need for more tests. Vaccines  Your health care provider may recommend certain vaccines, such as: Influenza vaccine. This is recommended every year. Tetanus, diphtheria, and acellular pertussis (Tdap, Td) vaccine. You may need a Td booster every 10 years. Zoster  vaccine. You may need this after age 9. Pneumococcal 13-valent conjugate (PCV13) vaccine. One dose is recommended after age 60. Pneumococcal polysaccharide (PPSV23) vaccine. One dose is recommended after age 53. Talk to your health care provider about which screenings and vaccines you need and how often you need them. This information is not intended to replace advice given to you by your health care provider. Make sure you discuss any questions you have with your health care provider. Document Released: 11/01/2015 Document Revised: 06/24/2016 Document Reviewed: 08/06/2015 Elsevier Interactive Patient Education  2017 [employer] Prevention in the Home Falls can cause injuries. They can happen to people of all ages. There are many things you can do to make your home safe and to help prevent falls. What can I do on the outside of my home? Regularly fix the edges of walkways and driveways and fix any cracks. Remove anything that might make you trip as you walk through a door, such as a raised step or threshold. Trim any bushes or trees on the path to your home. Use bright outdoor lighting. Clear any walking paths of anything that might make someone trip, such as rocks or tools. Regularly check to see if handrails are loose or broken. Make sure that both sides of any steps have handrails. Any raised decks and porches should have guardrails on the edges. Have any leaves, snow, or ice cleared regularly. Use sand or salt on walking paths during winter. Clean up any spills in your garage right away. This includes oil or grease spills. What can I do in the bathroom? Use night lights. Install grab bars by the toilet and in the tub and shower. Do not use towel bars as grab bars. Use non-skid mats or decals in the tub or shower. If you need to sit down in the shower, use a plastic, non-slip stool. Keep the floor dry. Clean up any water that spills on the floor as soon as it happens. Remove  soap buildup in the tub or shower regularly. Attach bath mats securely with double-sided non-slip rug tape. Do not have throw rugs and other things on the floor that can make you trip. What can I do in the bedroom? Use night lights. Make sure that you have a light by your bed that is easy to reach. Do not use any sheets or blankets that are too big for your bed. They should not hang down onto the floor. Have a firm chair that has side arms. You can use this for support while you get dressed. Do not have throw rugs and other things on the floor that can make you trip. What can I do in the kitchen? Clean up any spills right away. Avoid walking on wet floors. Keep items that you use a lot in easy-to-reach places. If you need to reach something above you, use a strong step stool that has a grab bar. Keep electrical cords out of the way. Do not use floor polish or wax that makes floors slippery. If you must use wax, use non-skid floor wax. Do not have throw rugs and other things on the floor that can make you trip. What can I do  with my stairs? Do not leave any items on the stairs. Make sure that there are handrails on both sides of the stairs and use them. Fix handrails that are broken or loose. Make sure that handrails are as long as the stairways. Check any carpeting to make sure that it is firmly attached to the stairs. Fix any carpet that is loose or worn. Avoid having throw rugs at the top or bottom of the stairs. If you do have throw rugs, attach them to the floor with carpet tape. Make sure that you have a light switch at the top of the stairs and the bottom of the stairs. If you do not have them, ask someone to add them for you. What else can I do to help prevent falls? Wear shoes that: Do not have high heels. Have rubber bottoms. Are comfortable and fit you well. Are closed at the toe. Do not wear sandals. If you use a stepladder: Make sure that it is fully opened. Do not climb a  closed stepladder. Make sure that both sides of the stepladder are locked into place. Ask someone to hold it for you, if possible. Clearly mark and make sure that you can see: Any grab bars or handrails. First and last steps. Where the edge of each step is. Use tools that help you move around (mobility aids) if they are needed. These include: Canes. Walkers. Scooters. Crutches. Turn on the lights when you go into a dark area. Replace any light bulbs as soon as they burn out. Set up your furniture so you have a clear path. Avoid moving your furniture around. If any of your floors are uneven, fix them. If there are any pets around you, be aware of where they are. Review your medicines with your doctor. Some medicines can make you feel dizzy. This can increase your chance of falling. Ask your doctor what other things that you can do to help prevent falls. This information is not intended to replace advice given to you by your health care provider. Make sure you discuss any questions you have with your health care provider. Document Released: 08/01/2009 Document Revised: 03/12/2016 Document Reviewed: 11/09/2014 Elsevier Interactive Patient Education  2017 [employer].

## 2022-10-27 ENCOUNTER — Encounter (HOSPITAL_BASED_OUTPATIENT_CLINIC_OR_DEPARTMENT_OTHER): Payer: Medicare Other | Admitting: Internal Medicine

## 2022-11-02 ENCOUNTER — Encounter (HOSPITAL_BASED_OUTPATIENT_CLINIC_OR_DEPARTMENT_OTHER): Payer: Medicare Other | Admitting: Internal Medicine

## 2022-11-02 DIAGNOSIS — L97812 Non-pressure chronic ulcer of other part of right lower leg with fat layer exposed: Secondary | ICD-10-CM | POA: Diagnosis not present

## 2022-11-02 DIAGNOSIS — L97822 Non-pressure chronic ulcer of other part of left lower leg with fat layer exposed: Secondary | ICD-10-CM | POA: Diagnosis not present

## 2022-11-02 DIAGNOSIS — Z79899 Other long term (current) drug therapy: Secondary | ICD-10-CM | POA: Diagnosis not present

## 2022-11-02 DIAGNOSIS — I87313 Chronic venous hypertension (idiopathic) with ulcer of bilateral lower extremity: Secondary | ICD-10-CM | POA: Diagnosis not present

## 2022-11-02 DIAGNOSIS — E11622 Type 2 diabetes mellitus with other skin ulcer: Secondary | ICD-10-CM | POA: Diagnosis not present

## 2022-11-02 DIAGNOSIS — T798XXA Other early complications of trauma, initial encounter: Secondary | ICD-10-CM | POA: Diagnosis not present

## 2022-11-02 DIAGNOSIS — S81801A Unspecified open wound, right lower leg, initial encounter: Secondary | ICD-10-CM | POA: Diagnosis not present

## 2022-11-02 DIAGNOSIS — X58XXXA Exposure to other specified factors, initial encounter: Secondary | ICD-10-CM | POA: Diagnosis not present

## 2022-11-02 DIAGNOSIS — E1122 Type 2 diabetes mellitus with diabetic chronic kidney disease: Secondary | ICD-10-CM | POA: Diagnosis not present

## 2022-11-02 DIAGNOSIS — N183 Chronic kidney disease, stage 3 unspecified: Secondary | ICD-10-CM | POA: Diagnosis not present

## 2022-11-02 DIAGNOSIS — S81802A Unspecified open wound, left lower leg, initial encounter: Secondary | ICD-10-CM | POA: Diagnosis not present

## 2022-11-02 NOTE — Progress Notes (Signed)
ISEL, SKUFCA ([id number]) [id number] Page 1 of 8 Visit Report for 11/02/2022 Arrival Information Details Patient Name: Date of Service: [name], [place] [name]. 11/02/2022 9:00 A M Medical Record Number: [id number] Patient Account Number: [id number] Date of Birth/Sex: Treating RN: November 12, 1945 (77 y.o. [name], [name] Primary Care Sherle Mello: [name] Other Clinician: Referring Fannie Gathright: Treating Erlinda Solinger/Extender: Jeri Modena in Treatment: 23 Visit Information History Since Last Visit Added or deleted any medications: No Patient Arrived: [name] Any new allergies or adverse reactions: No Arrival Time: 08:54 Had a fall or experienced change in No Accompanied By: son activities of daily living that may affect Transfer Assistance: None risk of falls: Patient Identification Verified: Yes Signs or symptoms of abuse/neglect since last visito No Secondary Verification Process Completed: Yes Hospitalized since last visit: No Patient Requires Transmission-Based Precautions: No Implantable device outside of the clinic excluding No Patient Has Alerts: No cellular tissue based products placed in the center since last visit: Has Dressing in Place as Prescribed: Yes Has Compression in Place as Prescribed: No Pain Present Now: No Notes wrap removed saturday. placed compression stocking on right leg per patient. Electronic Signature(s) Signed: 11/02/2022 3:49:37 PM By: [name] RN, BSN Entered By: [name] on 11/02/2022 08:54:56 -------------------------------------------------------------------------------- Clinic Level of Care Assessment Details Patient Name: Date of Service: [name], [place] [name]. 11/02/2022 9:00 A M Medical Record Number: [id number] Patient Account Number: [id number] Date of Birth/Sex: Treating RN: [birth year] (77 y.o. [name] Primary Care Chayanne Filippi: [name] Other Clinician: Referring Jeselle Hiser: Treating  Havard Radigan/Extender: Jeri Modena in Treatment: 23 Clinic Level of Care Assessment Items TOOL 4 Quantity Score X- 1 0 Use when only an EandM is performed on FOLLOW-UP visit ASSESSMENTS - Nursing Assessment / Reassessment X- 1 10 Reassessment of Co-morbidities (includes updates in patient status) X- 1 5 Reassessment of Adherence to Treatment Plan ASSESSMENTS - Wound and Skin A ssessment / Reassessment X - Simple Wound Assessment / Reassessment - one wound 1 5 '[]'$  - 0 Complex Wound Assessment / Reassessment - multiple wounds X- 1 10 Dermatologic / Skin Assessment (not related to wound area) ASSESSMENTS - Focused Assessment [name] ([id number]) [id number] Page 2 of 8 '[]'$  - 0 Circumferential Edema Measurements - multi extremities '[]'$  - 0 Nutritional Assessment / Counseling / Intervention '[]'$  - 0 Lower Extremity Assessment (monofilament, tuning fork, pulses) '[]'$  - 0 Peripheral Arterial Disease Assessment (using hand held doppler) ASSESSMENTS - Ostomy and/or Continence Assessment and Care '[]'$  - 0 Incontinence Assessment and Management '[]'$  - 0 Ostomy Care Assessment and Management (repouching, etc.) PROCESS - Coordination of Care X - Simple Patient / Family Education for ongoing care 1 15 '[]'$  - 0 Complex (extensive) Patient / Family Education for ongoing care X- 1 10 Staff obtains Programmer, systems, Records, T Results / Process Orders est '[]'$  - 0 Staff telephones HHA, Nursing Homes / Clarify orders / etc '[]'$  - 0 Routine Transfer to another Facility (non-emergent condition) '[]'$  - 0 Routine Hospital Admission (non-emergent condition) '[]'$  - 0 New Admissions / Biomedical engineer / Ordering NPWT Apligraf, etc. , '[]'$  - 0 Emergency Hospital Admission (emergent condition) X- 1 10 Simple Discharge Coordination '[]'$  - 0 Complex (extensive) Discharge Coordination PROCESS - Special Needs '[]'$  - 0 Pediatric / Minor Patient Management '[]'$  -  0 Isolation Patient Management '[]'$  - 0 Hearing / Language / Visual special needs '[]'$  - 0 Assessment of Community assistance (transportation, D/C planning, etc.) '[]'$  - 0 Additional  assistance / Altered mentation '[]'$  - 0 Support Surface(s) Assessment (bed, cushion, seat, etc.) INTERVENTIONS - Wound Cleansing / Measurement X - Simple Wound Cleansing - one wound 1 5 '[]'$  - 0 Complex Wound Cleansing - multiple wounds X- 1 5 Wound Imaging (photographs - any number of wounds) '[]'$  - 0 Wound Tracing (instead of photographs) X- 1 5 Simple Wound Measurement - one wound '[]'$  - 0 Complex Wound Measurement - multiple wounds INTERVENTIONS - Wound Dressings '[]'$  - 0 Small Wound Dressing one or multiple wounds '[]'$  - 0 Medium Wound Dressing one or multiple wounds '[]'$  - 0 Large Wound Dressing one or multiple wounds '[]'$  - 0 Application of Medications - topical '[]'$  - 0 Application of Medications - injection INTERVENTIONS - Miscellaneous '[]'$  - 0 External ear exam '[]'$  - 0 Specimen Collection (cultures, biopsies, blood, body fluids, etc.) '[]'$  - 0 Specimen(s) / Culture(s) sent or taken to Lab for analysis '[]'$  - 0 Patient Transfer (multiple staff / Harrel Lemon Lift / Similar devices) '[]'$  - 0 Simple Staple / Suture removal (25 or less) [name] ([id number]) [id number] Page 3 of 8 '[]'$  - 0 Complex Staple / Suture removal (26 or more) '[]'$  - 0 Hypo / Hyperglycemic Management (close monitor of Blood Glucose) '[]'$  - 0 Ankle / Brachial Index (ABI) - do not check if billed separately X- 1 5 Vital Signs Has the patient been seen at the hospital within the last three years: Yes Total Score: 85 Level Of Care: New/Established - Level 3 Electronic Signature(s) Signed: 11/02/2022 3:49:37 PM By: [name] RN, BSN Entered By: [name] on 11/02/2022 09:02:50 -------------------------------------------------------------------------------- Encounter Discharge Information Details Patient  Name: Date of Service: [name], [place] [name]. 11/02/2022 9:00 A M Medical Record Number: [id number] Patient Account Number: [id number] Date of Birth/Sex: Treating RN: 1946-02-02 (77 y.o. [name], [name] Primary Care Greco Gastelum: [name] Other Clinician: Referring Kaiyla Stahly: Treating Deontaye Civello/Extender: Jeri Modena in Treatment: 23 Encounter Discharge Information Items Discharge Condition: Stable Ambulatory Status: Walker Discharge Destination: Home Transportation: Private Auto Accompanied By: son Schedule Follow-up Appointment: No Clinical Summary of Care: Electronic Signature(s) Signed: 11/02/2022 3:49:37 PM By: [name] RN, BSN Entered By: [name] on 11/02/2022 09:03:14 -------------------------------------------------------------------------------- Lower Extremity Assessment Details Patient Name: Date of Service: [name], [place] [name]. 11/02/2022 9:00 A M Medical Record Number: [id number] Patient Account Number: [id number] Date of Birth/Sex: Treating RN: [birth year] (77 y.o. [name] Primary Care Lucresia Simic: [name] Other Clinician: Referring Ariyanah Aguado: Treating Saniyyah Elster/Extender: Jeri Modena in Treatment: 23 Edema Assessment Assessed: [name]: No] Patrice Paradise: Yes] Edema: [Left: Ye] [Right: s] Calf Left: Right: Point of Measurement: 41 cm From Medial Instep 44 cm Ankle Left: Right: Point of Measurement: 8 cm From Medial Instep 24.3 cm Brandy, CRASS [name] ([id number]) [phone].pdf Page 4 of 8 Knee To Floor Left: Right: From Medial Instep 45 cm Electronic Signature(s) Signed: 11/02/2022 3:49:37 PM By: [name] RN, BSN Entered By: [name] on 11/02/2022 09:04:37 -------------------------------------------------------------------------------- Multi Wound Chart Details Patient Name: Date of Service: [name], [place] [name]. 11/02/2022 9:00 A M Medical Record Number: [id number] Patient Account  Number: [id number] Date of Birth/Sex: Treating RN: 30-Jul-1946 (76 y.o. F) Primary Care Senan Urey: [name] Other Clinician: Referring Saamir Armstrong: Treating Kaisy Severino/Extender: Jeri Modena in Treatment: 23 Vital Signs Height(in): 63 Pulse(bpm): 21 Weight(lbs): Blood Pressure(mmHg): 163/79 Body Mass Index(BMI): Temperature(F): 98.2 Respiratory Rate(breaths/min): 20 [3:Photos:] [N/A:N/A] Right, Lateral Lower Leg N/A N/A Wound Location: Trauma  N/A N/A Wounding Event: Trauma, Other N/A N/A Primary Etiology: Cataracts, Anemia, Congestive Heart N/A N/A Comorbid History: Failure, Hypertension, Osteoarthritis 06/20/2022 N/A N/A Date Acquired: 42 N/A N/A Weeks of Treatment: Open N/A N/A Wound Status: No N/A N/A Wound Recurrence: 0x0x0 N/A N/A Measurements L x W x D (cm) 0 N/A N/A A (cm) : rea 0 N/A N/A Volume (cm) : 100.00% N/A N/A % Reduction in Area: 100.00% N/A N/A % Reduction in Volume: Full Thickness With Exposed Support N/A N/A Classification: Structures None Present N/A N/A Exudate Amount: Distinct, outline attached N/A N/A Wound Margin: None Present (0%) N/A N/A Granulation Amount: None Present (0%) N/A N/A Necrotic Amount: Fascia: No N/A N/A Exposed Structures: Fat Layer (Subcutaneous Tissue): No Tendon: No Muscle: No Joint: No Bone: No Large (67-100%) N/A N/A Epithelialization: Excoriation: No N/A N/A Periwound Skin Texture: Induration: No Callus: No Crepitus: No Rash: No Scarring: No Maceration: No N/A N/A Periwound Skin Moisture: Brandy, CANNER [name] ([id number]) [id number] Page 5 of 8 Dry/Scaly: No Atrophie Blanche: No N/A N/A Periwound Skin Color: Cyanosis: No Ecchymosis: No Erythema: No Hemosiderin Staining: No Mottled: No Pallor: No Rubor: No No Abnormality N/A N/A Temperature: Treatment Notes Electronic Signature(s) Signed: 11/02/2022 3:35:21 PM By: Linton Ham MD Entered By:  Linton Ham on 11/02/2022 09:07:54 -------------------------------------------------------------------------------- Multi-Disciplinary Care Plan Details Patient Name: Date of Service: [name], [place] [name]. 11/02/2022 9:00 A M Medical Record Number: [id number] Patient Account Number: [id number] Date of Birth/Sex: Treating RN: 11/25/1945 (77 y.o. [name] Primary Care Antron Seth: [name] Other Clinician: Referring Analiah Drum: Treating Micheline Markes/Extender: Jeri Modena in Treatment: 23 Active Inactive Electronic Signature(s) Signed: 11/02/2022 3:49:37 PM By: [name] RN, BSN Entered By: [name] on 11/02/2022 09:02:13 -------------------------------------------------------------------------------- Pain Assessment Details Patient Name: Date of Service: [name], [place] [name]. 11/02/2022 9:00 A M Medical Record Number: [id number] Patient Account Number: [id number] Date of Birth/Sex: Treating RN: 11/19/1945 (77 y.o. [name] Primary Care Keyairra Kolinski: [name] Other Clinician: Referring Zoe Goonan: Treating Renwick Asman/Extender: Jeri Modena in Treatment: 23 Active Problems Location of Pain Severity and Description of Pain Patient Has Paino No Site Locations Rate the pain. Brandy, [name] ([id number]) [id number] Page 6 of 8 Rate the pain. Current Pain Level: 0 Pain Management and Medication Current Pain Management: Medication: No Cold Application: No Rest: No Massage: No Activity: No T.E.N.S.: No Heat Application: No Leg drop or elevation: No Is the Current Pain Management Adequate: Adequate How does your wound impact your activities of daily livingo Sleep: No Bathing: No Appetite: No Relationship With Others: No Bladder Continence: No Emotions: No Bowel Continence: No Work: No Toileting: No Drive: No Dressing: No Hobbies: No Electronic Signature(s) Signed: 11/02/2022 3:49:37 PM  By: [name] RN, BSN Entered By: [name] on 11/02/2022 08:55:48 -------------------------------------------------------------------------------- Wound Assessment Details Patient Name: Date of Service: [name], [place] [name]. 11/02/2022 9:00 A M Medical Record Number: [id number] Patient Account Number: [id number] Date of Birth/Sex: Treating RN: May 18, 1946 (77 y.o. [name] Primary Care Shamika Pedregon: [name] Other Clinician: Referring Ada Woodbury: Treating Yuette Putnam/Extender: Jeri Modena in Treatment: 23 Wound Status Wound Number: 3 Primary Trauma, Other Etiology: Wound Location: Right, Lateral Lower Leg Wound Status: Open Wounding Event: Trauma Comorbid Cataracts, Anemia, Congestive Heart Failure, Hypertension, Date Acquired: 06/20/2022 History: Osteoarthritis Weeks Of Treatment: 18 Clustered Wound: No Photos Brandy, HEDBERG [name] ([id number]) [phone].pdf Page 7 of 8 Wound Measurements Length: (cm) Width: (cm) Depth: (cm) Area: (  cm) Volume: (cm) 0 % Reduction in Area: 100% 0 % Reduction in Volume: 100% 0 Epithelialization: Large (67-100%) 0 Tunneling: No 0 Undermining: No Wound Description Classification: Full Thickness With Exposed Support Wound Margin: Distinct, outline attached Exudate Amount: None Present Structures Foul Odor After Cleansing: No Slough/Fibrino No Wound Bed Granulation Amount: None Present (0%) Exposed Structure Necrotic Amount: None Present (0%) Fascia Exposed: No Fat Layer (Subcutaneous Tissue) Exposed: No Tendon Exposed: No Muscle Exposed: No Joint Exposed: No Bone Exposed: No Periwound Skin Texture Texture Color No Abnormalities Noted: Yes No Abnormalities Noted: No Atrophie Blanche: No Moisture Cyanosis: No No Abnormalities Noted: Yes Ecchymosis: No Erythema: No Hemosiderin Staining: No Mottled: No Pallor: No Rubor: No Temperature / Pain Temperature: No  Abnormality Electronic Signature(s) Signed: 11/02/2022 3:49:37 PM By: [name] RN, BSN Entered By: [name] on 11/02/2022 08:57:37 -------------------------------------------------------------------------------- Vitals Details Patient Name: Date of Service: [name], [place] [name]. 11/02/2022 9:00 A M Medical Record Number: [id number] Patient Account Number: [id number] Date of Birth/Sex: Treating RN: [birth year] (77 y.o. [name] Primary Care Tonetta Napoles: [name] Other Clinician: Referring Baleigh Rennaker: Treating Meri Pelot/Extender: Jeri Modena in Treatment: 23 Vital Signs Time Taken: 08:54 Temperature (F): 98.2 Height (in): 63 Pulse (bpm): 73 [name], [name] ([id number]) [phone].pdf Page 8 of 8 Respiratory Rate (breaths/min): 20 Blood Pressure (mmHg): 163/79 Reference Range: 80 - 120 mg / dl Electronic Signature(s) Signed: 11/02/2022 3:49:37 PM By: [name] RN, BSN Entered By: [name] on 11/02/2022 08:55:41

## 2022-11-02 NOTE — Progress Notes (Signed)
[name], [name] ([id number]) [id number] Page 1 of 6 Visit Report for 11/02/2022 HPI Details Patient Name: Date of Service: [name], [name] [name]. 11/02/2022 9:00 A M Medical Record Number: [id number] Patient Account Number: [id number] Date of Birth/Sex: Treating RN: [birth year] (77 y.o. F) Primary Care Provider: [name] Other Clinician: Referring Provider: Treating Provider/Extender: Jeri Modena in Treatment: 23 History of Present Illness HPI Description: Admission 05/25/2022 [name] is a 77 year old female with a past medical history of diet-controlled type 2 diabetes, chronic 3 stage kidney disease and venous insufficiency that presents to the clinic for a 1 month history of nonhealing ulcer to the left leg. She states that her dog scratched her causing a wound. She has developed cellulitis in this leg and has been treated with clindamycin by her primary care physician. She reports improvement in her symptoms. She currently denies signs of infection. She has been keeping the area covered. She does not wear compression stockings. She is on 80 mg of Lasix twice daily. She has had reflux studies to the left leg on 07/2021 that notes venous reflux throughout the left common femoral vein and greater saphenous vein. She has not had an ablation. She follows with vein and vascular for her venous insufficiency. 8/14; patient presents for follow-up. She states that the wrap stayed on for 4 days and eventually slid down. She has been wearing her compression stocking and doing dressing changes with Hydrofera Blue since. She also states she has a hard time putting on her shoes with the 3 layer compression. She denies signs of infection. 8/21; patient presents for follow-up. She again had trouble with the wrap sliding down. We have been using Hydrofera Blue with gentamicin under 2 layer Coflex. She currently denies signs of infection. 8/28;  patient presents for follow-up. She reports taking the wrap off yesterday. We have been using Hydrofera Blue with gentamicin under 2 layer Coflex. She states that the wrap does slide down but remains over the wound. She is currently wearing her compression stocking. She would like to do this instead of the compression wrap. 9/5; patient presents for follow-up. She has been using Medihoney to the left lower extremity remedy wound with her compression stockings. She reports no drainage from the site. Unfortunately she fell over the weekend and had to go to the ED due to lacerations she experienced on her left and right lower extremity. On the left leg she had 10 sutures placed on the right leg she had 7. She was given Keflex. Today she reports no signs of infection. She has been using Xeroform to the suture sites. 9/11; patient presents for follow-up. She has been using bacitracin ointment to the suture line. She currently denies signs of infection. 9/19; patient presents for follow-up. She has been using Medihoney to the wound beds. She denies signs of infection. 9/26; patient presents for follow-up. She has been using Dakin's wet-to-dry dressings to the wound beds. She reports improvement in wound healing. She has no issues or complaints today. 10/3; patient presents for follow-up. We had used compression therapy along with Hydrofera Blue to the right lower extremity. She states she felt uncomfortable with the wrap and took it off 3 days ago. She has been using Medihoney without issues to the left lower extremity wound. She denies signs of infection. 10/10; patient presents for follow-up. She has been using Hydrofera Blue and Medihoney to the left lower extremity wound. We have been using Hydrofera Blue and Santyl  under compression therapy to the right lower extremity. She states that the wrap slid down 2 days ago and she took it off. She has no issues or complaints today. She denies signs of  infection. 10/17; patient presents for follow-up. We have been using Hydrofera Blue to the left lower extremity wounds and Hydrofera Blue and Santyl under compression therapy to the right lower extremity. She has no issues or complaints today. She denies signs of infection. 10/23; patient presents for follow-up. We have been using silver alginate to the left lower extremity and Hydrofera Blue and Santyl under Kerlix/Coban to the right lower extremity. It is unclear which she is using to the left leg. She denies signs of infection. She has no issues or complaints today. 10/30; patient presents for follow-up. We have been using Hydrofera Blue and Santyl under Kerlix/Coban to the right lower extremity and silver alginate with Medihoney to the left lower extremity under compression stocking. She has no issues or complaints today. 11/13; patient presents for follow-up. We have been using Hydrofera Blue and Santyl under Kerlix/Coban to the right lower extremity. We have been using Medihoney and silver alginate to the left lower extremity wounds. This area is healed. She has no issues or complaints today. 11/20; patient presents for follow-up. We have been using Hydrofera Blue and Santyl under Kerlix/Coban to the right lower extremity. She states that she took the wrap off yesterday Due to the drainage coming through from the wound bed. 11/28; patient presents for follow-up. She took the wrap off over the weekend. She does this often. He has no issues or complaints today. We have been using silver alginate with antibiotic ointment under compression therapy. 12/4; patient presents for follow-up. She took the wrap off over the weekend. We have been using silver alginate with antibiotic ointment under compression therapy. 12/12; patient presents for follow-up. She again took the wrap off over the weekend. She reports minimal drainage. We have been using silver alginate with antibiotic ointment under  compression therapy. it is unclear if she is using a dressing when she takes the wrap off. She is using bandages to keep the area covered. 12/18; patient presents for follow-up. She again took the wrap off over the weekend. Despite this there is still is improvement in wound healing. She has no Brandy, BERDAN [name] ([id number]) [id number] Page 2 of 6 issues or complaints today. 12/26; patient presents for follow-up. She took the wrap off over the weekend. We have been using Hydrofera Blue with antibiotic ointment under the wrap. She has no issues or complaints today. There continues to be improvement in wound healing. 1/2; patient presents for follow-up. She took the wrap off over the weekend. She states she has been using Hydrofera Blue. We have been using Hydrofera Blue with antibiotic ointment under Kerlix/Coban. She continues to improve and her wound healing. She has no issues or complaints today. 1/15; the patient comes into clinic today again with her own stocking on the right leg, she removes the wraps but fortunately this is healed. She has chronic venous insufficiency. She does not have a history of recurrent wounds. She has 20/30 below-knee stockings from elastic therapy Electronic Signature(s) Signed: 11/02/2022 3:35:21 PM By: Linton Ham MD Entered By: Linton Ham on 11/02/2022 09:10:20 -------------------------------------------------------------------------------- Physical Exam Details Patient Name: Date of Service: [name], [place] RO [name] [name]. 11/02/2022 9:00 A M Medical Record Number: [id number] Patient Account Number: [id number] Date of Birth/Sex: Treating RN: 08-Jul-1946 (77 y.o. F) Primary Care Provider: Jenny Reichmann,  Jeneen Rinks Other Clinician: Referring Provider: Treating Provider/Extender: Jeri Modena in Treatment: 23 Constitutional Patient is hypertensive.. Pulse regular and within target range for patient.Marland Kitchen Respirations regular,  non-labored and within target range.. Temperature is normal and within the target range for the patient.Marland Kitchen Appears in no distress. Cardiovascular Pedal pulses are palpable on the right. Patient has nonpitting edema with hemosiderin staining. Edema control is moderate. Notes Wound exam; area on the right lateral lower extremity is completely healed. There is a divot here however is fully epithelialized. As noted above her edema control is moderate Electronic Signature(s) Signed: 11/02/2022 3:35:21 PM By: Linton Ham MD Entered By: Linton Ham on 11/02/2022 09:12:57 -------------------------------------------------------------------------------- Physician Orders Details Patient Name: Date of Service: [name], [place] RO [name] [name]. 11/02/2022 9:00 A M Medical Record Number: [id number] Patient Account Number: [id number] Date of Birth/Sex: Treating RN: 07-05-1946 (77 y.o. [name] Primary Care Provider: [name] Other Clinician: Referring Provider: Treating Provider/Extender: Jeri Modena in Treatment: 23 Verbal / Phone Orders: No Diagnosis Coding Discharge From [facility] Services Discharge from [place] - Call if any future wound care needs. Edema Control - Lymphedema / SCD / Other Elevate legs to the level of the heart or above for 30 minutes daily and/or when sitting for 3-4 times a day throughout the day. Avoid standing for long periods of time. Patient to wear own compression stockings every day. - wear for life. Exercise regularly Moisturize legs daily. - every night before bed. ARES, TEGTMEYER ([id number]) [id number] Page 3 of 6 Compression stocking or Garment 20-30 mm/Hg pressure to: - Ensure you purchase compression stockings every six months to replace old ones. Electronic Signature(s) Signed: 11/02/2022 3:35:21 PM By: Linton Ham MD Signed: 11/02/2022 3:49:37 PM By: [name] RN, BSN Entered By: [name]  on 11/02/2022 09:02:02 -------------------------------------------------------------------------------- Problem List Details Patient Name: Date of Service: [name], [place] RO [name] [name]. 11/02/2022 9:00 A M Medical Record Number: [id number] Patient Account Number: [id number] Date of Birth/Sex: Treating RN: Mar 21, 1946 (77 y.o. F) Primary Care Provider: [name] Other Clinician: Referring Provider: Treating Provider/Extender: Jeri Modena in Treatment: 23 Active Problems ICD-10 Encounter Code Description Active Date MDM Diagnosis [phone] Non-pressure chronic ulcer of other part of right lower leg with fat layer 07/14/2022 No Yes exposed I87.311 Chronic venous hypertension (idiopathic) with ulcer of right lower extremity 10/20/2022 No Yes E11.622 Type 2 diabetes mellitus with other skin ulcer 05/25/2022 No Yes T79.8XXA Other early complications of trauma, initial encounter 06/23/2022 No Yes S81.801A Unspecified open wound, right lower leg, initial encounter 06/23/2022 No Yes S81.802A Unspecified open wound, left lower leg, initial encounter 06/23/2022 No Yes Inactive Problems Resolved Problems ICD-10 Code Description Active Date Resolved Date W54.8XXA Other contact with dog, initial encounter 05/25/2022 05/25/2022 I94.854 Non-pressure chronic ulcer of other part of left lower leg with fat layer exposed 05/25/2022 05/25/2022 I87.313 Chronic venous hypertension (idiopathic) with ulcer of bilateral lower extremity 07/14/2022 07/14/2022 Electronic Signature(s) Signed: 11/02/2022 3:35:21 PM By: Linton Ham MD [place],Signed: 11/02/2022 3:35:21 PM By: Linton Ham MD Lanora Manis ([id number]) [id number] Page 4 of 6 Entered By: Linton Ham on 11/02/2022 09:07:30 -------------------------------------------------------------------------------- Progress Note Details Patient Name: Date of Service: [name], [name] [name]. 11/02/2022 9:00 A M Medical Record Number:  [id number] Patient Account Number: [id number] Date of Birth/Sex: Treating RN: 09/05/1946 (77 y.o. F) Primary Care Provider: [name] Other Clinician: Referring Provider: Treating Provider/Extender: Jeri Modena in Treatment: 23 Subjective  History of Present Illness (HPI) Admission 05/25/2022 Ms. Nadiyah Zeis is a 77 year old female with a past medical history of diet-controlled type 2 diabetes, chronic 3 stage kidney disease and venous insufficiency that presents to the clinic for a 1 month history of nonhealing ulcer to the left leg. She states that her dog scratched her causing a wound. She has developed cellulitis in this leg and has been treated with clindamycin by her primary care physician. She reports improvement in her symptoms. She currently denies signs of infection. She has been keeping the area covered. She does not wear compression stockings. She is on 80 mg of Lasix twice daily. She has had reflux studies to the left leg on 07/2021 that notes venous reflux throughout the left common femoral vein and greater saphenous vein. She has not had an ablation. She follows with vein and vascular for her venous insufficiency. 8/14; patient presents for follow-up. She states that the wrap stayed on for 4 days and eventually slid down. She has been wearing her compression stocking and doing dressing changes with Hydrofera Blue since. She also states she has a hard time putting on her shoes with the 3 layer compression. She denies signs of infection. 8/21; patient presents for follow-up. She again had trouble with the wrap sliding down. We have been using Hydrofera Blue with gentamicin under 2 layer Coflex. She currently denies signs of infection. 8/28; patient presents for follow-up. She reports taking the wrap off yesterday. We have been using Hydrofera Blue with gentamicin under 2 layer Coflex. She states that the wrap does slide down but remains over the wound. She is  currently wearing her compression stocking. She would like to do this instead of the compression wrap. 9/5; patient presents for follow-up. She has been using Medihoney to the left lower extremity remedy wound with her compression stockings. She reports no drainage from the site. Unfortunately she fell over the weekend and had to go to the ED due to lacerations she experienced on her left and right lower extremity. On the left leg she had 10 sutures placed on the right leg she had 7. She was given Keflex. Today she reports no signs of infection. She has been using Xeroform to the suture sites. 9/11; patient presents for follow-up. She has been using bacitracin ointment to the suture line. She currently denies signs of infection. 9/19; patient presents for follow-up. She has been using Medihoney to the wound beds. She denies signs of infection. 9/26; patient presents for follow-up. She has been using Dakin's wet-to-dry dressings to the wound beds. She reports improvement in wound healing. She has no issues or complaints today. 10/3; patient presents for follow-up. We had used compression therapy along with Hydrofera Blue to the right lower extremity. She states she felt uncomfortable with the wrap and took it off 3 days ago. She has been using Medihoney without issues to the left lower extremity wound. She denies signs of infection. 10/10; patient presents for follow-up. She has been using Hydrofera Blue and Medihoney to the left lower extremity wound. We have been using Hydrofera Blue and Santyl under compression therapy to the right lower extremity. She states that the wrap slid down 2 days ago and she took it off. She has no issues or complaints today. She denies signs of infection. 10/17; patient presents for follow-up. We have been using Hydrofera Blue to the left lower extremity wounds and Hydrofera Blue and Santyl under compression therapy to the right lower extremity. She has  no issues or  complaints today. She denies signs of infection. 10/23; patient presents for follow-up. We have been using silver alginate to the left lower extremity and Hydrofera Blue and Santyl under Kerlix/Coban to the right lower extremity. It is unclear which she is using to the left leg. She denies signs of infection. She has no issues or complaints today. 10/30; patient presents for follow-up. We have been using Hydrofera Blue and Santyl under Kerlix/Coban to the right lower extremity and silver alginate with Medihoney to the left lower extremity under compression stocking. She has no issues or complaints today. 11/13; patient presents for follow-up. We have been using Hydrofera Blue and Santyl under Kerlix/Coban to the right lower extremity. We have been using Medihoney and silver alginate to the left lower extremity wounds. This area is healed. She has no issues or complaints today. 11/20; patient presents for follow-up. We have been using Hydrofera Blue and Santyl under Kerlix/Coban to the right lower extremity. She states that she took the wrap off yesterday Due to the drainage coming through from the wound bed. 11/28; patient presents for follow-up. She took the wrap off over the weekend. She does this often. He has no issues or complaints today. We have been using silver alginate with antibiotic ointment under compression therapy. 12/4; patient presents for follow-up. She took the wrap off over the weekend. We have been using silver alginate with antibiotic ointment under compression therapy. 12/12; patient presents for follow-up. She again took the wrap off over the weekend. She reports minimal drainage. We have been using silver alginate with antibiotic ointment under compression therapy. it is unclear if she is using a dressing when she takes the wrap off. She is using bandages to keep the area covered. 12/18; patient presents for follow-up. She again took the wrap off over the weekend. Despite this  there is still is improvement in wound healing. She has no Brandy, NAPPI [name] ([id number]) [id number] Page 5 of 6 issues or complaints today. 12/26; patient presents for follow-up. She took the wrap off over the weekend. We have been using Hydrofera Blue with antibiotic ointment under the wrap. She has no issues or complaints today. There continues to be improvement in wound healing. 1/2; patient presents for follow-up. She took the wrap off over the weekend. She states she has been using Hydrofera Blue. We have been using Hydrofera Blue with antibiotic ointment under Kerlix/Coban. She continues to improve and her wound healing. She has no issues or complaints today. 1/15; the patient comes into clinic today again with her own stocking on the right leg, she removes the wraps but fortunately this is healed. She has chronic venous insufficiency. She does not have a history of recurrent wounds. She has 20/30 below-knee stockings from elastic therapy Objective Constitutional Patient is hypertensive.. Pulse regular and within target range for patient.Marland Kitchen Respirations regular, non-labored and within target range.. Temperature is normal and within the target range for the patient.Marland Kitchen Appears in no distress. Vitals Time Taken: 8:54 AM, Height: 63 in, Temperature: 98.2 F, Pulse: 73 bpm, Respiratory Rate: 20 breaths/min, Blood Pressure: 163/79 mmHg. Cardiovascular Pedal pulses are palpable on the right. Patient has nonpitting edema with hemosiderin staining. Edema control is moderate. General Notes: Wound exam; area on the right lateral lower extremity is completely healed. There is a divot here however is fully epithelialized. As noted above her edema control is moderate Integumentary (Hair, Skin) Wound #3 status is Open. Original cause of wound was Trauma. The date acquired  was: 06/20/2022. The wound has been in treatment 18 weeks. The wound is located on the Right,Lateral Lower  Leg. The wound measures 0cm length x 0cm width x 0cm depth; 0cm^2 area and 0cm^3 volume. There is no tunneling or undermining noted. There is a none present amount of drainage noted. The wound margin is distinct with the outline attached to the wound base. There is no granulation within the wound bed. There is no necrotic tissue within the wound bed. The periwound skin appearance had no abnormalities noted for texture. The periwound skin appearance had no abnormalities noted for moisture. The periwound skin appearance did not exhibit: Atrophie Blanche, Cyanosis, Ecchymosis, Hemosiderin Staining, Mottled, Pallor, Rubor, Erythema. Periwound temperature was noted as No Abnormality. Assessment Active Problems ICD-10 Non-pressure chronic ulcer of other part of right lower leg with fat layer exposed Chronic venous hypertension (idiopathic) with ulcer of right lower extremity Type 2 diabetes mellitus with other skin ulcer Other early complications of trauma, initial encounter Unspecified open wound, right lower leg, initial encounter Unspecified open wound, left lower leg, initial encounter Plan Discharge From [facility] Services: Discharge from [place] - Call if any future wound care needs. Edema Control - Lymphedema / SCD / Other: Elevate legs to the level of the heart or above for 30 minutes daily and/or when sitting for 3-4 times a day throughout the day. Avoid standing for long periods of time. Patient to wear own compression stockings every day. - wear for life. Exercise regularly Moisturize legs daily. - every night before bed. Compression stocking or Garment 20-30 mm/Hg pressure to: - Ensure you purchase compression stockings every six months to replace old ones. 1. The patient is healed 2. She is seems to be compliant with her 20/30 below-knee stockings from elastic therapy 3. She was advised to use moisturizer to her lower extremities nightly and to keep her legs elevated when sitting  for prolonged periods 4. She can be discharged from the clinic Electronic Signature(s) Signed: 11/02/2022 3:35:21 PM By: Linton Ham MD Thomasene Mohair ([id number]) [id number] Page 6 of 6 Entered By: Linton Ham on 11/02/2022 09:14:29 -------------------------------------------------------------------------------- SuperBill Details Patient Name: Date of Service: [name], [name] [name]. 11/02/2022 Medical Record Number: [id number] Patient Account Number: [id number] Date of Birth/Sex: Treating RN: 10-Mar-1946 (77 y.o. [name] Primary Care Provider: [name] Other Clinician: Referring Provider: Treating Provider/Extender: Jeri Modena in Treatment: 23 Diagnosis Coding ICD-10 Codes Code Description [phone] Non-pressure chronic ulcer of other part of right lower leg with fat layer exposed I87.311 Chronic venous hypertension (idiopathic) with ulcer of right lower extremity E11.622 Type 2 diabetes mellitus with other skin ulcer T79.8XXA Other early complications of trauma, initial encounter S81.801A Unspecified open wound, right lower leg, initial encounter S81.802A Unspecified open wound, left lower leg, initial encounter Facility Procedures : CPT4 Code: [id number] Description: 99213 - WOUND CARE VISIT-LEV 3 EST PT Modifier: Quantity: 1 Physician Procedures : CPT4 Code Description Modifier [id number] - WC PHYS LEVEL 2 - EST PT ICD-10 Diagnosis Description L89.373 Non-pressure chronic ulcer of other part of right lower leg with fat layer exposed I87.311 Chronic venous hypertension (idiopathic) with ulcer  of right lower extremity E11.622 Type 2 diabetes mellitus with other skin ulcer Quantity: 1 Electronic Signature(s) Signed: 11/02/2022 3:35:21 PM By: Linton Ham MD Entered By: Linton Ham on 11/02/2022 09:14:46

## 2022-11-10 DIAGNOSIS — M48062 Spinal stenosis, lumbar region with neurogenic claudication: Secondary | ICD-10-CM | POA: Diagnosis not present

## 2022-11-16 ENCOUNTER — Ambulatory Visit (INDEPENDENT_AMBULATORY_CARE_PROVIDER_SITE_OTHER): Payer: Medicare Other | Admitting: Internal Medicine

## 2022-11-16 VITALS — BP 130/76 | HR 66 | Temp 98.1°F | Ht 63.0 in | Wt 229.0 lb

## 2022-11-16 DIAGNOSIS — Z23 Encounter for immunization: Secondary | ICD-10-CM | POA: Diagnosis not present

## 2022-11-16 DIAGNOSIS — H6122 Impacted cerumen, left ear: Secondary | ICD-10-CM

## 2022-11-16 DIAGNOSIS — R739 Hyperglycemia, unspecified: Secondary | ICD-10-CM

## 2022-11-16 DIAGNOSIS — E559 Vitamin D deficiency, unspecified: Secondary | ICD-10-CM | POA: Diagnosis not present

## 2022-11-16 DIAGNOSIS — E538 Deficiency of other specified B group vitamins: Secondary | ICD-10-CM

## 2022-11-16 DIAGNOSIS — I10 Essential (primary) hypertension: Secondary | ICD-10-CM

## 2022-11-16 DIAGNOSIS — E782 Mixed hyperlipidemia: Secondary | ICD-10-CM

## 2022-11-16 LAB — LIPID PANEL
Cholesterol: 161 mg/dL (ref 0–200)
HDL: 57.9 mg/dL (ref >39.00)
LDL Cholesterol: 72 mg/dL (ref 0–99)
NonHDL: 102.89
Total CHOL/HDL Ratio: 3
Triglycerides: 153 mg/dL — ABNORMAL HIGH (ref 0.0–149.0)
VLDL: 30.6 mg/dL (ref 0.0–40.0)

## 2022-11-16 LAB — BASIC METABOLIC PANEL
BUN: 25 mg/dL — ABNORMAL HIGH (ref 6–23)
CO2: 29 mEq/L (ref 19–32)
Calcium: 9.6 mg/dL (ref 8.4–10.5)
Chloride: 102 mEq/L (ref 96–112)
Creatinine, Ser: 0.93 mg/dL (ref 0.40–1.20)
GFR: 60.01 mL/min (ref >60.00)
Glucose, Bld: 92 mg/dL (ref 70–99)
Potassium: 3.7 mEq/L (ref 3.5–5.1)
Sodium: 140 mEq/L (ref 135–145)

## 2022-11-16 LAB — HEPATIC FUNCTION PANEL
ALT: 16 U/L (ref 0–35)
AST: 15 U/L (ref 0–37)
Albumin: 4.1 g/dL (ref 3.5–5.2)
Alkaline Phosphatase: 114 U/L (ref 39–117)
Bilirubin, Direct: 0.1 mg/dL (ref 0.0–0.3)
Total Bilirubin: 0.4 mg/dL (ref 0.2–1.2)
Total Protein: 7.2 g/dL (ref 6.0–8.3)

## 2022-11-16 LAB — URINALYSIS, ROUTINE W REFLEX MICROSCOPIC
Bilirubin Urine: NEGATIVE
Hgb urine dipstick: NEGATIVE
Ketones, ur: NEGATIVE
Leukocytes,Ua: NEGATIVE
Nitrite: NEGATIVE
Specific Gravity, Urine: 1.02 (ref 1.000–1.030)
Total Protein, Urine: NEGATIVE
Urine Glucose: NEGATIVE
Urobilinogen, UA: 0.2 (ref 0.0–1.0)
pH: 6 (ref 5.0–8.0)

## 2022-11-16 LAB — CBC WITH DIFFERENTIAL/PLATELET
Basophils Absolute: 0.1 10*3/uL (ref 0.0–0.1)
Basophils Relative: 1 % (ref 0.0–3.0)
Eosinophils Absolute: 0.7 10*3/uL (ref 0.0–0.7)
Eosinophils Relative: 6.5 % — ABNORMAL HIGH (ref 0.0–5.0)
HCT: 35.9 % — ABNORMAL LOW (ref 36.0–46.0)
Hemoglobin: 11.9 g/dL — ABNORMAL LOW (ref 12.0–15.0)
Lymphocytes Relative: 21.2 % (ref 12.0–46.0)
Lymphs Abs: 2.2 10*3/uL (ref 0.7–4.0)
MCHC: 33.3 g/dL (ref 30.0–36.0)
MCV: 87.6 fl (ref 78.0–100.0)
Monocytes Absolute: 0.9 10*3/uL (ref 0.1–1.0)
Monocytes Relative: 9.2 % (ref 3.0–12.0)
Neutro Abs: 6.4 10*3/uL (ref 1.4–7.7)
Neutrophils Relative %: 62.1 % (ref 43.0–77.0)
Platelets: 367 10*3/uL (ref 150.0–400.0)
RBC: 4.1 Mil/uL (ref 3.87–5.11)
RDW: 14.2 % (ref 11.5–15.5)
WBC: 10.2 10*3/uL (ref 4.0–10.5)

## 2022-11-16 LAB — VITAMIN D 25 HYDROXY (VIT D DEFICIENCY, FRACTURES): VITD: 41.95 ng/mL (ref 30.00–100.00)

## 2022-11-16 LAB — HEMOGLOBIN A1C: Hgb A1c MFr Bld: 6.8 % — ABNORMAL HIGH (ref 4.6–6.5)

## 2022-11-16 LAB — TSH: TSH: 1.11 u[IU]/mL (ref 0.35–5.50)

## 2022-11-16 LAB — VITAMIN B12: Vitamin B-12: 1399 pg/mL — ABNORMAL HIGH (ref 211–911)

## 2022-11-16 LAB — MICROALBUMIN / CREATININE URINE RATIO
Creatinine,U: 129.9 mg/dL
Microalb Creat Ratio: 7.8 mg/g (ref 0.0–30.0)
Microalb, Ur: 10.2 mg/dL — ABNORMAL HIGH (ref 0.0–1.9)

## 2022-11-16 NOTE — Progress Notes (Unsigned)
PRE-PROCEDURE EXAM: Left TM cannot be visualized due to total occlusion/impaction of the ear canal.  PROCEDURE INDICATION: remove wax to visualize ear drum & relieve discomfort  CONSENT:  Verbal     PROCEDURE NOTE:     LEFT EAR:  I used warm water irrigation under direct visualization with the otoscope to free the wax bolus from the ear canal.    POST- PROCEDURE EXAM: TMs successfully visualized and found to have no erythema     The patient tolerated the procedure well.

## 2022-11-16 NOTE — Patient Instructions (Signed)
Your left ear was irrigated today  You had the flu shot today  Ok to increase the lipitor to 80 mg per day  Please call if you would want the referral to Urology regarding the incontinence  Please continue all other medications as before, and refills have been done if requested.  Please have the pharmacy call with any other refills you may need.  Please continue your efforts at being more active, low cholesterol diet, and weight control.  You are otherwise up to date with prevention measures today.  Please keep your appointments with your specialists as you may have planned - Physical Therapy  Please go to the LAB at the blood drawing area for the tests to be done  You will be contacted by phone if any changes need to be made immediately.  Otherwise, you will receive a letter about your results with an explanation, but please check with MyChart first.  Please remember to sign up for MyChart if you have not done so, as this will be important to you in the future with finding out test results, communicating by private email, and scheduling acute appointments online when needed.  Please make an Appointment to return in 6 months, or sooner if needed

## 2022-11-16 NOTE — Progress Notes (Unsigned)
Patient ID: [name], female   DOB: 12-09-1945, 77 y.o.   MRN: [id number]        Chief Complaint: follow up left hearing loss, hld, urinary incontinence, hyperglycemia, low vit d       HPI:  [name] is a 77 y.o. female here overall doing ok, but Pt denies chest pain, increased sob or doe, wheezing, orthopnea, PND, increased LE swelling, palpitations, dizziness or syncope.   Pt denies polydipsia, polyuria, or new focal neuro s/s.    Pt denies fever, wt loss, night sweats, loss of appetite, or other constitutional symptoms  Pt continues to have recurring LBP without change in severity, bowel or bladder change, fever, wt loss,  worsening LE pain/numbness/weakness, gait change or falls - to start PT soon.  Due for flu shot.  Does have reduced hearing in the past wk and tihnks she may have wax again.  .Denies urinary symptoms such as dysuria, frequency, urgency, flank pain, hematuria or n/v, fever, chills, but does have mild intemittent incontinence, but declines urology referral for now.  Trying to follow lower chol diet.   Wt Readings from Last 3 Encounters:  11/16/22 229 lb (103.9 kg)  10/23/22 219 lb (99.3 kg)  08/18/22 238 lb (108 kg)   BP Readings from Last 3 Encounters:  11/16/22 130/76  08/18/22 132/76  06/20/22 (!) 141/78         Past Medical History:  Diagnosis Date   Allergic rhinitis, cause unspecified    Allergy    SEASONAL   Anemia    Cataract    Bilateral   CHF (congestive heart failure) (HCC)    Chronic headaches    Constipation    Diverticulosis    DJD (degenerative joint disease), lumbar    GERD (gastroesophageal reflux disease)    Hemorrhoids    Hiatal hernia    HTN (hypertension)    Hyperlipidemia    Obesity    Osteoarthritis    Tubular adenoma of colon    Past Surgical History:  Procedure Laterality Date   ABLATION     uterus   COLONOSCOPY  05/15/2021   [facility]   TOTAL KNEE ARTHROPLASTY Right 04/14/2019   Procedure: TOTAL KNEE  ARTHROPLASTY;  Surgeon: Dorna Leitz, MD;  Location: [facility];  Service: Orthopedics;  Laterality: Right;   TUBAL LIGATION      reports that she has never smoked. She has never been exposed to tobacco smoke. She has never used smokeless tobacco. She reports that she does not drink alcohol and does not use drugs. family history includes Breast cancer in her sister; Diabetes in her brother; Heart disease in her mother; Hypertension in her mother. Allergies  Allergen Reactions   Doxycycline Itching    Rash, itching   Oxycodone    Soybean-Containing Drug Products Other (See Comments)    Allergy testing positive for soy beans but pt uses soy sauce   Sulfonamide Derivatives Itching and Rash   Current Outpatient Medications on File Prior to Visit  Medication Sig Dispense Refill   aspirin-acetaminophen-caffeine (EXCEDRIN MIGRAINE) 250-250-65 MG tablet Take 1 tablet by mouth every 6 (six) hours as needed for headache.     atorvastatin (LIPITOR) 40 MG tablet TAKE 1 TABLET EVERY DAY 90 tablet 3   azelastine (ASTELIN) 0.1 % nasal spray Place 1 spray into both nostrils 2 (two) times daily. Use in each nostril as directed 90 mL 4   azelastine (OPTIVAR) 0.05 % ophthalmic solution Place 1 drop into both eyes  2 (two) times daily. 6 mL 12   betamethasone, augmented, (DIPROLENE) 0.05 % lotion Apply topically daily as needed.     cetirizine (ZYRTEC) 10 MG tablet Take 1 tablet (10 mg total) by mouth daily. As needed (Patient taking differently: Take 10 mg by mouth daily as needed for allergies. As needed) 90 tablet 3   Cholecalciferol 50 MCG (2000 UT) TABS 1 tab by mouth once daily 30 tablet 99   diltiazem (DILACOR XR) 240 MG 24 hr capsule TAKE 1 CAPSULE EVERY DAY 90 capsule 3   DULoxetine (CYMBALTA) 20 MG capsule TAKE 2 CAPSULES EVERY DAY 180 capsule 3   ferrous sulfate 325 (65 FE) MG tablet Take 1 tablet (325 mg total) by mouth 2 (two) times daily with a meal.  3   furosemide (LASIX) 80 MG tablet TAKE 1 TABLET  TWICE DAILY 180 tablet 10   gabapentin (NEURONTIN) 100 MG capsule TAKE 2 CAPSULES AT BEDTIME 180 capsule 1   HYDROcodone-acetaminophen (NORCO/VICODIN) 5-325 MG tablet Take 0.5 tablets by mouth daily as needed.     hydrOXYzine (ATARAX) 10 MG tablet Take 1 tablet (10 mg total) by mouth at bedtime as needed for itching. 60 tablet 2   losartan (COZAAR) 100 MG tablet Take 1 tablet (100 mg total) by mouth daily. Annual appt due in Sept must see provider for future refills 90 tablet 0   metoprolol tartrate (LOPRESSOR) 50 MG tablet TAKE 1 TABLET TWICE DAILY 180 tablet 3   ondansetron (ZOFRAN) 4 MG tablet Take 1 tablet (4 mg total) by mouth every 8 (eight) hours as needed for nausea or vomiting. 40 tablet 1   pantoprazole (PROTONIX) 40 MG tablet TAKE 1 TABLET(40 MG) BY MOUTH DAILY 90 tablet 0   Polyethyl Glycol-Propyl Glycol (SYSTANE OP) Place 1 drop into both eyes 3 (three) times daily as needed (dry eyes).     Polyethylene Glycol 3350 (MIRALAX PO) Take 17 g by mouth daily as needed (constipation).      potassium chloride (KLOR-CON M) 10 MEQ tablet TAKE 1 TABLET EVERY DAY 90 tablet 3   promethazine-dextromethorphan (PROMETHAZINE-DM) 6.25-15 MG/5ML syrup Take 5 mLs by mouth 4 (four) times daily as needed for cough. 118 mL 0   tiZANidine (ZANAFLEX) 2 MG tablet TAKE 1 TABLET EVERY 6 HOURS AS NEEDED FOR MUSCLE SPASMS 60 tablet 3   triamcinolone (NASACORT) 55 MCG/ACT AERO nasal inhaler Place 2 sprays into the nose daily. 1 each 12   triamcinolone cream (KENALOG) 0.1 % Apply 1 application topically 2 (two) times daily. 453 g 0   vitamin B-12 (CYANOCOBALAMIN) 1000 MCG tablet Take 1,000 mcg by mouth daily.     No current facility-administered medications on file prior to visit.        ROS:  All others reviewed and negative.  Objective        PE:  BP 130/76 (BP Location: Right Arm, Patient Position: Sitting, Cuff Size: Large)   Pulse 66   Temp 98.1 F (36.7 C) (Oral)   Ht '5\' 3"'$  (1.6 m)   Wt 229 lb (103.9  kg)   SpO2 92%   BMI 40.57 kg/m                 Constitutional: Pt appears in NAD               HENT: Head: NCAT.                Right Ear: External ear normal.  Left Ear: External ear normal.                Eyes: . Pupils are equal, round, and reactive to light. Conjunctivae and EOM are normal               Nose: without d/c or deformity               Neck: Neck supple. Gross normal ROM               Cardiovascular: Normal rate and regular rhythm.                 Pulmonary/Chest: Effort normal and breath sounds without rales or wheezing.                Abd:  Soft, NT, ND, + BS, no organomegaly               Neurological: Pt is alert. At baseline orientation, motor grossly intact               Skin: Skin is warm. No rashes, no other new lesions, LE edema - none               Psychiatric: Pt behavior is normal without agitation   Micro: none  Cardiac tracings I have personally interpreted today:  none  Pertinent Radiological findings (summarize): none   Lab Results  Component Value Date   WBC 10.2 11/16/2022   HGB 11.9 (L) 11/16/2022   HCT 35.9 (L) 11/16/2022   PLT 367.0 11/16/2022   GLUCOSE 92 11/16/2022   CHOL 161 11/16/2022   TRIG 153.0 (H) 11/16/2022   HDL 57.90 11/16/2022   LDLDIRECT 93.0 01/14/2021   LDLCALC 72 11/16/2022   ALT 16 11/16/2022   AST 15 11/16/2022   NA 140 11/16/2022   K 3.7 11/16/2022   CL 102 11/16/2022   CREATININE 0.93 11/16/2022   BUN 25 (H) 11/16/2022   CO2 29 11/16/2022   TSH 1.11 11/16/2022   INR 1.2 08/13/2020   HGBA1C 6.8 (H) 11/16/2022   MICROALBUR 10.2 (H) 11/16/2022   Assessment/Plan:  [name] is a 77 y.o. Black or African American [2] female with  has a past medical history of Allergic rhinitis, cause unspecified, Allergy, Anemia, Cataract, CHF (congestive heart failure) ([place]), Chronic headaches, Constipation, Diverticulosis, DJD (degenerative joint disease), lumbar, GERD (gastroesophageal reflux disease),  Hemorrhoids, Hiatal hernia, HTN (hypertension), Hyperlipidemia, Obesity, Osteoarthritis, and Tubular adenoma of colon.  Impacted cerumen Ceruminosis is noted.  Wax is removed by syringing and manual debridement. Instructions for home care to prevent wax buildup are given.   Vitamin D deficiency Last vitamin D Lab Results  Component Value Date   VD25OH 41.95 11/16/2022   Stable, cont oral replacement   Mixed hyperlipidemia Lab Results  Component Value Date   LDLCALC 72 11/16/2022   Uncontrolled goal ldl < 70,, pt for increased lipitor 80 mg qd   Hyperglycemia Lab Results  Component Value Date   HGBA1C 6.8 (H) 11/16/2022   Mild elevated,  pt to continue current diet and wt control, declines OHA for now  Essential (primary) hypertension BP Readings from Last 3 Encounters:  11/16/22 130/76  08/18/22 132/76  06/20/22 (!) 141/78   Stable, pt to continue medical treatment dilacor 240 qd, lopressor 50 bid, losartan 100 qd  Followup: Return in about 6 months (around 05/17/2023).  [name], MD 11/19/2022 12:13 PM [facility]

## 2022-11-19 ENCOUNTER — Encounter: Payer: Self-pay | Admitting: Internal Medicine

## 2022-11-19 NOTE — Assessment & Plan Note (Signed)
Lab Results  Component Value Date   HGBA1C 6.8 (H) 11/16/2022   Mild elevated,  pt to continue current diet and wt control, declines OHA for now

## 2022-11-19 NOTE — Assessment & Plan Note (Signed)
Ceruminosis is noted.  Wax is removed by syringing and manual debridement. Instructions for home care to prevent wax buildup are given.  

## 2022-11-19 NOTE — Assessment & Plan Note (Signed)
Lab Results  Component Value Date   LDLCALC 72 11/16/2022   Uncontrolled goal ldl < 70,, pt for increased lipitor 80 mg qd

## 2022-11-19 NOTE — Assessment & Plan Note (Signed)
Last vitamin D Lab Results  Component Value Date   VD25OH 41.95 11/16/2022   Stable, cont oral replacement

## 2022-11-19 NOTE — Assessment & Plan Note (Signed)
BP Readings from Last 3 Encounters:  11/16/22 130/76  08/18/22 132/76  06/20/22 (!) 141/78   Stable, pt to continue medical treatment dilacor 240 qd, lopressor 50 bid, losartan 100 qd

## 2022-11-24 ENCOUNTER — Telehealth: Payer: Self-pay | Admitting: Internal Medicine

## 2022-11-24 DIAGNOSIS — H9209 Otalgia, unspecified ear: Secondary | ICD-10-CM

## 2022-11-24 MED ORDER — LOSARTAN POTASSIUM 100 MG PO TABS: 100.0000 mg | ORAL_TABLET | Freq: Every day | ORAL | 3 refills | Status: DC

## 2022-11-24 MED ORDER — ATORVASTATIN CALCIUM 40 MG PO TABS: 40.0000 mg | ORAL_TABLET | Freq: Every day | ORAL | 3 refills | Status: DC

## 2022-11-24 NOTE — Telephone Encounter (Signed)
Patient had a referral for this last year and it was closed out due to patient refusing service

## 2022-11-24 NOTE — Telephone Encounter (Signed)
Patient called requesting a referral for her ear. She previously saw Dr. Jenny Reichmann on 11/16/22. She said she was supposed to have a referral to a specialist for her ear. She would like a callback if she can get the referral. Best callback number is [phone].

## 2022-11-24 NOTE — Telephone Encounter (Signed)
Caller & Relationship to patient: [name] - self  Call back number: [phone]  Date of last office visit: 11-16-22  Date of next office visit: 05-17-23  Medication(s) to be refilled:  atorvastatin (LIPITOR) 40 MG tablet   losartan (COZAAR) 100 MG tablet    Preferred Pharmacy: [facility] [place], [place]

## 2022-11-24 NOTE — Telephone Encounter (Signed)
Ok this is done 

## 2022-11-24 NOTE — Telephone Encounter (Signed)
Tried to send electronically would not go through. Sent rx fax to [facility],Johny Chess

## 2022-11-25 ENCOUNTER — Other Ambulatory Visit: Payer: Self-pay

## 2022-11-25 DIAGNOSIS — L03116 Cellulitis of left lower limb: Secondary | ICD-10-CM

## 2022-11-25 DIAGNOSIS — N1832 Chronic kidney disease, stage 3b: Secondary | ICD-10-CM

## 2022-11-25 MED ORDER — HYDROXYZINE HCL 10 MG PO TABS: 10.0000 mg | ORAL_TABLET | Freq: Every evening | ORAL | 2 refills | Status: DC | PRN

## 2022-11-25 NOTE — Telephone Encounter (Signed)
LOV 11/16/22, last prescribed by Jeralyn Ruths 04/30/22

## 2022-11-26 ENCOUNTER — Other Ambulatory Visit: Payer: Self-pay | Admitting: *Deleted

## 2022-11-26 ENCOUNTER — Telehealth: Payer: Self-pay | Admitting: Internal Medicine

## 2022-11-26 DIAGNOSIS — M7989 Other specified soft tissue disorders: Secondary | ICD-10-CM

## 2022-11-26 MED ORDER — POTASSIUM CHLORIDE CRYS ER 10 MEQ PO TBCR: 10.0000 meq | EXTENDED_RELEASE_TABLET | Freq: Every day | ORAL | 3 refills | Status: DC

## 2022-11-26 MED ORDER — GABAPENTIN 100 MG PO CAPS: ORAL_CAPSULE | ORAL | 1 refills | Status: DC

## 2022-11-26 MED ORDER — DILTIAZEM HCL ER 240 MG PO CP24: 240.0000 mg | ORAL_CAPSULE | Freq: Every day | ORAL | 3 refills | Status: DC

## 2022-11-26 MED ORDER — ATORVASTATIN CALCIUM 40 MG PO TABS: 40.0000 mg | ORAL_TABLET | Freq: Every day | ORAL | 3 refills | Status: DC

## 2022-11-26 MED ORDER — FUROSEMIDE 80 MG PO TABS: 80.0000 mg | ORAL_TABLET | Freq: Two times a day (BID) | ORAL | 3 refills | Status: DC

## 2022-11-26 MED ORDER — METOPROLOL TARTRATE 50 MG PO TABS: 50.0000 mg | ORAL_TABLET | Freq: Two times a day (BID) | ORAL | 3 refills | Status: DC

## 2022-11-26 MED ORDER — DULOXETINE HCL 20 MG PO CPEP: ORAL_CAPSULE | ORAL | 3 refills | Status: DC

## 2022-11-26 NOTE — Telephone Encounter (Signed)
Patient called and said her medications were sent to the wrong pharmacy. She now uses a [facility], quantity. The medications are listed below. Best callback number is [phone].   diltiazem (DILACOR XR) 240 MG 24 hr capsule potassium chloride (KLOR-CON M) 10 MEQ tablet  losartan (COZAAR) 100 MG tablet Metoprolol '50mg'$  Gabapentin 100 mg Furosemide 80 mg Duloxentine 20 mg

## 2022-11-26 NOTE — Telephone Encounter (Signed)
Notified pt resent medications to [facility].Marland KitchenJohny Chess

## 2022-11-26 NOTE — Telephone Encounter (Signed)
Patient also needs the atorvastatin '40mg'$ .

## 2022-11-30 ENCOUNTER — Telehealth: Payer: Self-pay | Admitting: Internal Medicine

## 2022-11-30 NOTE — Telephone Encounter (Signed)
Called pt no answer LMOM rx was sent to [facility] on 11/26/22.Marland KitchenJohny Chess

## 2022-11-30 NOTE — Telephone Encounter (Signed)
jPatient is trying to get refills on her atorvastatin 90 day supply  with mail order.

## 2022-12-01 DIAGNOSIS — F1120 Opioid dependence, uncomplicated: Secondary | ICD-10-CM | POA: Diagnosis not present

## 2022-12-01 DIAGNOSIS — M48062 Spinal stenosis, lumbar region with neurogenic claudication: Secondary | ICD-10-CM | POA: Diagnosis not present

## 2022-12-01 DIAGNOSIS — Z6841 Body Mass Index (BMI) 40.0 and over, adult: Secondary | ICD-10-CM | POA: Diagnosis not present

## 2022-12-08 MED ORDER — METOPROLOL TARTRATE 50 MG PO TABS: 50.0000 mg | ORAL_TABLET | Freq: Two times a day (BID) | ORAL | 3 refills | Status: DC

## 2022-12-08 MED ORDER — ATORVASTATIN CALCIUM 40 MG PO TABS: 40.0000 mg | ORAL_TABLET | Freq: Every day | ORAL | 3 refills | Status: DC

## 2022-12-08 MED ORDER — POTASSIUM CHLORIDE CRYS ER 10 MEQ PO TBCR: 10.0000 meq | EXTENDED_RELEASE_TABLET | Freq: Every day | ORAL | 3 refills | Status: DC

## 2022-12-08 MED ORDER — GABAPENTIN 100 MG PO CAPS: ORAL_CAPSULE | ORAL | 1 refills | Status: DC

## 2022-12-08 MED ORDER — DULOXETINE HCL 20 MG PO CPEP: ORAL_CAPSULE | ORAL | 3 refills | Status: DC

## 2022-12-08 MED ORDER — LOSARTAN POTASSIUM 100 MG PO TABS: 100.0000 mg | ORAL_TABLET | Freq: Every day | ORAL | 3 refills | Status: DC

## 2022-12-08 MED ORDER — DILTIAZEM HCL ER 240 MG PO CP24: 240.0000 mg | ORAL_CAPSULE | Freq: Every day | ORAL | 3 refills | Status: DC

## 2022-12-08 MED ORDER — FUROSEMIDE 80 MG PO TABS: 80.0000 mg | ORAL_TABLET | Freq: Two times a day (BID) | ORAL | 3 refills | Status: DC

## 2022-12-08 NOTE — Addendum Note (Signed)
Addended by: Earnstine Regal on: 12/08/2022 03:52 PM   Modules accepted: Orders

## 2022-12-08 NOTE — Telephone Encounter (Signed)
Called pt she states she never received her medications from [facility]. Inform meds was sent to them on 11/26/22. She state she would like all her rx just to go [facility]. [facility] all meds that was [facility].Marland KitchenJohny Chess

## 2022-12-08 NOTE — Telephone Encounter (Signed)
Patient states this rx was never received by pharmacy - patient would like you to call her.  [phone]

## 2022-12-16 ENCOUNTER — Ambulatory Visit (HOSPITAL_COMMUNITY): Payer: Medicare Other

## 2022-12-16 ENCOUNTER — Ambulatory Visit: Payer: Medicare Other | Admitting: Vascular Surgery

## 2022-12-21 ENCOUNTER — Other Ambulatory Visit: Payer: Self-pay | Admitting: Internal Medicine

## 2022-12-21 DIAGNOSIS — H6123 Impacted cerumen, bilateral: Secondary | ICD-10-CM | POA: Diagnosis not present

## 2022-12-21 DIAGNOSIS — H6120 Impacted cerumen, unspecified ear: Secondary | ICD-10-CM | POA: Insufficient documentation

## 2022-12-21 DIAGNOSIS — H900 Conductive hearing loss, bilateral: Secondary | ICD-10-CM | POA: Insufficient documentation

## 2022-12-22 DIAGNOSIS — L853 Xerosis cutis: Secondary | ICD-10-CM | POA: Diagnosis not present

## 2022-12-22 DIAGNOSIS — L218 Other seborrheic dermatitis: Secondary | ICD-10-CM | POA: Diagnosis not present

## 2022-12-22 DIAGNOSIS — L299 Pruritus, unspecified: Secondary | ICD-10-CM | POA: Diagnosis not present

## 2022-12-31 DIAGNOSIS — L239 Allergic contact dermatitis, unspecified cause: Secondary | ICD-10-CM | POA: Diagnosis not present

## 2023-01-05 DIAGNOSIS — H6123 Impacted cerumen, bilateral: Secondary | ICD-10-CM | POA: Diagnosis not present

## 2023-01-11 ENCOUNTER — Telehealth: Payer: Self-pay | Admitting: Internal Medicine

## 2023-01-11 NOTE — Telephone Encounter (Signed)
Ok to let pharmacy know - ok to dispense lasix

## 2023-01-11 NOTE — Telephone Encounter (Signed)
Pharmacist from [facility] called and said the patient's chart on their end says she is allergic to furosemide. She was recently prescribed furosemide (LASIX) 80 MG tablet . The allergy is not listed in patient's chart. The pharmacist would like a call back to verify that she is able to take it.  Callback is [phone]

## 2023-01-12 NOTE — Telephone Encounter (Signed)
Called [facility] spoke w/kelly pharmacist gave MD response.Marland KitchenJohny Chess

## 2023-01-20 ENCOUNTER — Ambulatory Visit: Payer: Medicare Other | Admitting: Vascular Surgery

## 2023-01-20 ENCOUNTER — Ambulatory Visit (HOSPITAL_COMMUNITY): Payer: Medicare Other

## 2023-01-21 ENCOUNTER — Telehealth: Payer: Self-pay | Admitting: Internal Medicine

## 2023-01-21 MED ORDER — AZELASTINE HCL 0.1 % NA SOLN: 1.0000 | Freq: Two times a day (BID) | NASAL | 4 refills | Status: DC

## 2023-01-21 NOTE — Telephone Encounter (Signed)
Prescription Request  01/21/2023  LOV: 11/16/2022  What is the name of the medication or equipment? azelastine (ASTELIN) 0.1 % nasal spray   Have you contacted your pharmacy to request a refill? No   Which pharmacy would you like this sent to?  [facility] [place], [place] [id number] [place] [address] Phone: [phone] Fax: [phone]    Patient notified that their request is being sent to the clinical staff for review and that they should receive a response within 2 business days.   Please advise at Mobile [phone] (mobile)

## 2023-01-21 NOTE — Telephone Encounter (Signed)
Pls advise if ok refill last filled by Deneise Lever, MD../lb

## 2023-01-21 NOTE — Telephone Encounter (Signed)
Ok done erx 

## 2023-02-08 ENCOUNTER — Other Ambulatory Visit: Payer: Self-pay | Admitting: Internal Medicine

## 2023-02-08 MED ORDER — LOSARTAN POTASSIUM 100 MG PO TABS: 100.0000 mg | ORAL_TABLET | Freq: Every day | ORAL | 3 refills | Status: DC

## 2023-02-17 ENCOUNTER — Ambulatory Visit (HOSPITAL_COMMUNITY): Payer: Medicare Other

## 2023-02-17 ENCOUNTER — Ambulatory Visit: Payer: Medicare Other | Admitting: Vascular Surgery

## 2023-02-26 ENCOUNTER — Other Ambulatory Visit: Payer: Self-pay

## 2023-02-26 MED ORDER — LOSARTAN POTASSIUM 100 MG PO TABS: 100.0000 mg | ORAL_TABLET | Freq: Every day | ORAL | 3 refills | Status: DC

## 2023-03-16 DIAGNOSIS — L218 Other seborrheic dermatitis: Secondary | ICD-10-CM | POA: Diagnosis not present

## 2023-03-16 DIAGNOSIS — L239 Allergic contact dermatitis, unspecified cause: Secondary | ICD-10-CM | POA: Diagnosis not present

## 2023-03-23 ENCOUNTER — Other Ambulatory Visit: Payer: Self-pay | Admitting: Internal Medicine

## 2023-03-26 DIAGNOSIS — M48062 Spinal stenosis, lumbar region with neurogenic claudication: Secondary | ICD-10-CM | POA: Diagnosis not present

## 2023-04-07 ENCOUNTER — Ambulatory Visit (HOSPITAL_COMMUNITY)
Admission: RE | Admit: 2023-04-07 | Discharge: 2023-04-07 | Disposition: A | Discharge status: Home / Self Care | Payer: Medicare Other | Source: Ambulatory Visit | Attending: Vascular Surgery | Admitting: Vascular Surgery

## 2023-04-07 ENCOUNTER — Ambulatory Visit: Payer: Medicare Other | Admitting: Vascular Surgery

## 2023-04-07 ENCOUNTER — Encounter: Payer: Self-pay | Admitting: Vascular Surgery

## 2023-04-07 VITALS — BP 122/65 | HR 60 | Temp 98.1°F | Resp 18 | Ht 59.0 in | Wt 232.8 lb

## 2023-04-07 DIAGNOSIS — M7989 Other specified soft tissue disorders: Secondary | ICD-10-CM | POA: Insufficient documentation

## 2023-04-07 DIAGNOSIS — I872 Venous insufficiency (chronic) (peripheral): Secondary | ICD-10-CM | POA: Diagnosis not present

## 2023-04-07 DIAGNOSIS — M79662 Pain in left lower leg: Secondary | ICD-10-CM | POA: Insufficient documentation

## 2023-04-07 NOTE — Progress Notes (Signed)
REASON FOR VISIT:   Follow-up of chronic venous insufficiency  MEDICAL ISSUES:   CHRONIC VENOUS INSUFFICIENCY: This patient has CEAP C4 venous disease (hyperpigmentation).  We have again encouraged her to avoid prolonged sitting and standing.  We discussed the importance of exercise specifically walking and water aerobics.  She will continue to wear her compression stockings.  We discussed the importance of leg elevation and the proper positioning for this.  I also explained the importance of maintaining a healthy weight as this relates to venous disease and venous hypertension.  I think she would be a good candidate for laser ablation of the left great saphenous vein.  We have discussed this procedure and potential complications and she will consider this option.  If she elects to proceed we will try to get her scheduled in the near future.  I have discussed the indications for endovenous laser ablation of the left GSV, that is to lower the pressure in the veins and potentially help relieve the symptoms from venous hypertension.  I have also discussed alternative options such as conservative treatment as described above. I have discussed the potential complications of the procedure, including bleeding, bruising, leg swelling, deep venous thrombosis (<1% risk), or failure of the vein to close <1% risk).  I have also explained that venous insufficiency is a chronic disease, and that the patient is at risk for recurrent varicose veins in the future.  All of the patient's questions were encouraged and answered.    HPI:   [name] is a pleasant 77 y.o. female who I last saw in October 2022 with chronic venous insufficiency.  She had C4 venous disease.  She had some reflux in the left great saphenous vein however the vein was not especially dilated.  We discussed conservative measures.  She comes in for a 1 year follow-up visit.  Since I saw her last she does continue to have some aching  pain and burning in her left leg which is worse when she standing and is relieved with elevation.  I think the symptoms are related to her venous hypertension.  She denies any previous history of DVT.  She has been wearing her thigh-high compression stockings which do help her symptoms.  Her symptoms have gradually progressed.  Past Medical History:  Diagnosis Date   Allergic rhinitis, cause unspecified    Allergy    SEASONAL   Anemia    Cataract    Bilateral   CHF (congestive heart failure) (HCC)    Chronic headaches    Constipation    Diverticulosis    DJD (degenerative joint disease), lumbar    GERD (gastroesophageal reflux disease)    Hemorrhoids    Hiatal hernia    HTN (hypertension)    Hyperlipidemia    Obesity    Osteoarthritis    Tubular adenoma of colon     Family History  Problem Relation Age of Onset   Heart disease Mother    Hypertension Mother    Breast cancer Sister    Diabetes Brother    Colon cancer Neg Hx    Esophageal cancer Neg Hx    Rectal cancer Neg Hx    Stomach cancer Neg Hx    Colon polyps Neg Hx     SOCIAL HISTORY: Social History   Tobacco Use   Smoking status: Never    Passive exposure: Never   Smokeless tobacco: Never  Substance Use Topics   Alcohol use: No    Alcohol/week:  0.0 standard drinks of alcohol    Allergies  Allergen Reactions   Doxycycline Itching    Rash, itching   Oxycodone    Soybean-Containing Drug Products Other (See Comments)    Allergy testing positive for soy beans but pt uses soy sauce   Sulfonamide Derivatives Itching and Rash    Current Outpatient Medications  Medication Sig Dispense Refill   aspirin-acetaminophen-caffeine (EXCEDRIN MIGRAINE) 250-250-65 MG tablet Take 1 tablet by mouth every 6 (six) hours as needed for headache.     atorvastatin (LIPITOR) 40 MG tablet Take 1 tablet (40 mg total) by mouth daily. 90 tablet 3   azelastine (ASTELIN) 0.1 % nasal spray Place 1 spray into both nostrils 2 (two)  times daily. Use in each nostril as directed 90 mL 4   azelastine (OPTIVAR) 0.05 % ophthalmic solution Place 1 drop into both eyes 2 (two) times daily. 6 mL 12   betamethasone, augmented, (DIPROLENE) 0.05 % lotion Apply topically daily as needed.     cetirizine (ZYRTEC) 10 MG tablet Take 1 tablet (10 mg total) by mouth daily. As needed (Patient taking differently: Take 10 mg by mouth daily as needed for allergies. As needed) 90 tablet 3   Cholecalciferol 50 MCG (2000 UT) TABS 1 tab by mouth once daily 30 tablet 99   diltiazem (DILACOR XR) 240 MG 24 hr capsule Take 1 capsule (240 mg total) by mouth daily. 90 capsule 3   DULoxetine (CYMBALTA) 20 MG capsule TAKE 2 CAPSULES EVERY DAY 180 capsule 3   ferrous sulfate 325 (65 FE) MG tablet Take 1 tablet (325 mg total) by mouth 2 (two) times daily with a meal.  3   furosemide (LASIX) 80 MG tablet Take 1 tablet (80 mg total) by mouth 2 (two) times daily. 180 tablet 3   gabapentin (NEURONTIN) 100 MG capsule TAKE 2 CAPSULES AT BEDTIME 180 capsule 1   HYDROcodone-acetaminophen (NORCO/VICODIN) 5-325 MG tablet Take 0.5 tablets by mouth daily as needed.     hydrOXYzine (ATARAX) 10 MG tablet Take 1 tablet (10 mg total) by mouth at bedtime as needed for itching. 60 tablet 2   losartan (COZAAR) 100 MG tablet Take 1 tablet (100 mg total) by mouth daily. 90 tablet 3   metoprolol tartrate (LOPRESSOR) 50 MG tablet Take 1 tablet (50 mg total) by mouth 2 (two) times daily. 180 tablet 3   ondansetron (ZOFRAN) 4 MG tablet Take 1 tablet (4 mg total) by mouth every 8 (eight) hours as needed for nausea or vomiting. 40 tablet 1   pantoprazole (PROTONIX) 40 MG tablet TAKE 1 TABLET(40 MG) BY MOUTH DAILY 90 tablet 0   Polyethyl Glycol-Propyl Glycol (SYSTANE OP) Place 1 drop into both eyes 3 (three) times daily as needed (dry eyes).     Polyethylene Glycol 3350 (MIRALAX PO) Take 17 g by mouth daily as needed (constipation).      potassium chloride (KLOR-CON M) 10 MEQ tablet Take 1  tablet (10 mEq total) by mouth daily. 90 tablet 3   promethazine-dextromethorphan (PROMETHAZINE-DM) 6.25-15 MG/5ML syrup Take 5 mLs by mouth 4 (four) times daily as needed for cough. 118 mL 0   tiZANidine (ZANAFLEX) 2 MG tablet TAKE 1 TABLET EVERY 6 HOURS AS NEEDED FOR MUSCLE SPASMS 60 tablet 3   triamcinolone (NASACORT) 55 MCG/ACT AERO nasal inhaler Place 2 sprays into the nose daily. 1 each 12   triamcinolone cream (KENALOG) 0.1 % Apply 1 application topically 2 (two) times daily. 453 g 0   vitamin  B-12 (CYANOCOBALAMIN) 1000 MCG tablet Take 1,000 mcg by mouth daily.     No current facility-administered medications for this visit.    REVIEW OF SYSTEMS:  [X]  denotes positive finding, [ ]  denotes negative finding Cardiac  Comments:  Chest pain or chest pressure:    Shortness of breath upon exertion:    Short of breath when lying flat:    Irregular heart rhythm:        Vascular    Pain in calf, thigh, or hip brought on by ambulation:    Pain in feet at night that wakes you up from your sleep:     Blood clot in your veins:    Leg swelling:         Pulmonary    Oxygen at home:    Productive cough:     Wheezing:         Neurologic    Sudden weakness in arms or legs:     Sudden numbness in arms or legs:     Sudden onset of difficulty speaking or slurred speech:    Temporary loss of vision in one eye:     Problems with dizziness:         Gastrointestinal    Blood in stool:     Vomited blood:         Genitourinary    Burning when urinating:     Blood in urine:        Psychiatric    Major depression:         Hematologic    Bleeding problems:    Problems with blood clotting too easily:        Skin    Rashes or ulcers:        Constitutional    Fever or chills:     PHYSICAL EXAM:   Vitals:   04/07/23 1312  BP: 122/65  Pulse: 60  Resp: 18  Temp: 98.1 F (36.7 C)  TempSrc: Temporal  SpO2: 94%  Weight: 232 lb 12.8 oz (105.6 kg)  Height: 4\' 11"  (1.499 m)     GENERAL: The patient is a well-nourished female, in no acute distress. The vital signs are documented above. CARDIAC: There is a regular rate and rhythm.  VASCULAR: I do not detect carotid bruits. She has biphasic signals in the dorsalis pedis and posterior tibial positions bilaterally. She has hyperpigmentation bilaterally but more significantly on the left side.     PULMONARY: There is good air exchange bilaterally without wheezing or rales. ABDOMEN: Soft and non-tender with normal pitched bowel sounds.  MUSCULOSKELETAL: There are no major deformities or cyanosis. NEUROLOGIC: No focal weakness or paresthesias are detected. SKIN: There are no ulcers or rashes noted. PSYCHIATRIC: The patient has a normal affect.  DATA:    VENOUS DUPLEX: I have independently interpreted her venous duplex scan today.  This was of the left lower extremity only.  There was no evidence of DVT.  There was deep venous reflux in the common femoral vein.  There was superficial venous reflux in the left great saphenous vein from the saphenofemoral junction to the proximal calf.  Diameters of the vein ranged from 5-6.4 mm.  The results of the study are summarized in the diagram below.    Waverly Ferrari [facility] [phone]

## 2023-04-09 ENCOUNTER — Other Ambulatory Visit: Payer: Self-pay | Admitting: Internal Medicine

## 2023-04-15 ENCOUNTER — Other Ambulatory Visit: Payer: Self-pay | Admitting: Internal Medicine

## 2023-04-15 DIAGNOSIS — Z1231 Encounter for screening mammogram for malignant neoplasm of breast: Secondary | ICD-10-CM

## 2023-04-23 ENCOUNTER — Other Ambulatory Visit: Payer: Self-pay | Admitting: Internal Medicine

## 2023-04-26 ENCOUNTER — Telehealth: Payer: Self-pay | Admitting: Internal Medicine

## 2023-04-26 NOTE — Telephone Encounter (Signed)
Inbound call requesting a refill for pantoprazole medication. Also requesting a call back regarding her nausea, abdominal pain, and constipation to see if she can do anything to help before her appointment. She is scheduled for next available 9/11. Please advise, thank you.

## 2023-04-28 ENCOUNTER — Other Ambulatory Visit: Payer: Self-pay | Admitting: *Deleted

## 2023-04-28 MED ORDER — PANTOPRAZOLE SODIUM 40 MG PO TBEC: DELAYED_RELEASE_TABLET | ORAL | 0 refills | Status: DC

## 2023-04-28 MED ORDER — PANTOPRAZOLE SODIUM 40 MG PO TBEC: 40.0000 mg | DELAYED_RELEASE_TABLET | Freq: Every day | ORAL | 3 refills | Status: DC

## 2023-04-28 NOTE — Telephone Encounter (Signed)
Called patient to discuss issues with nausea, abdominal pain and constipation. Patient states she has diarrhea when she eats certain foods that she knows she should not be eating, followed by nausea and later constipation. Patient continues to take her Miralax and Protonix, stating that both are working. Patient encouraged to maintain her diet and refrain from eating the foods that cause her diarrhea, which is probably also causing the abdominal pain. Patient needs refill for Protonix.

## 2023-04-28 NOTE — Telephone Encounter (Signed)
Called patient to inform her that Dr. Rhea Belton would fill her Protonix prescription and if her symptoms worsened to call to make an appt to be seen for an urgent OV. Patient understood and agreed.

## 2023-04-28 NOTE — Telephone Encounter (Signed)
Can refill pantoprazole  If symptoms worsen before her OV in Sept she should let us know and we can look for an earlier urgent appt Thanks JMP

## 2023-05-17 ENCOUNTER — Ambulatory Visit: Payer: Medicare Other | Admitting: Internal Medicine

## 2023-05-25 ENCOUNTER — Ambulatory Visit: Payer: Medicare Other

## 2023-05-25 DIAGNOSIS — H02423 Myogenic ptosis of bilateral eyelids: Secondary | ICD-10-CM | POA: Diagnosis not present

## 2023-05-25 DIAGNOSIS — H40053 Ocular hypertension, bilateral: Secondary | ICD-10-CM | POA: Diagnosis not present

## 2023-05-25 DIAGNOSIS — H04123 Dry eye syndrome of bilateral lacrimal glands: Secondary | ICD-10-CM | POA: Diagnosis not present

## 2023-05-25 DIAGNOSIS — H1045 Other chronic allergic conjunctivitis: Secondary | ICD-10-CM | POA: Diagnosis not present

## 2023-05-27 ENCOUNTER — Telehealth: Payer: Self-pay | Admitting: Internal Medicine

## 2023-05-27 NOTE — Telephone Encounter (Signed)
Prescription Request  05/27/2023  LOV: 11/16/2022  What is the name of the medication or equipment? hydrOXYzine (ATARAX) 10 MG tablet   Have you contacted your pharmacy to request a refill? No   Which pharmacy would you like this sent to?    [id number] - [place], [place] - [address] AT [address] [place] [address] Phone: [phone] Fax: [phone]  Patient notified that their request is being sent to the clinical staff for review and that they should receive a response within 2 business days.   Please advise at Mobile [phone] (mobile)

## 2023-05-28 ENCOUNTER — Other Ambulatory Visit: Payer: Self-pay | Admitting: Radiology

## 2023-05-28 DIAGNOSIS — N1832 Chronic kidney disease, stage 3b: Secondary | ICD-10-CM

## 2023-05-28 DIAGNOSIS — L03116 Cellulitis of left lower limb: Secondary | ICD-10-CM

## 2023-05-28 MED ORDER — HYDROXYZINE HCL 10 MG PO TABS
10.0000 mg | ORAL_TABLET | Freq: Every evening | ORAL | 2 refills | Status: DC | PRN
Start: 2023-05-28 — End: 2023-11-10

## 2023-06-02 ENCOUNTER — Ambulatory Visit
Admission: RE | Admit: 2023-06-02 | Discharge: 2023-06-02 | Disposition: A | Discharge status: Home / Self Care | Payer: Medicare Other | Source: Ambulatory Visit | Attending: Internal Medicine | Admitting: Internal Medicine

## 2023-06-02 DIAGNOSIS — Z1231 Encounter for screening mammogram for malignant neoplasm of breast: Secondary | ICD-10-CM | POA: Diagnosis not present

## 2023-06-03 ENCOUNTER — Encounter (INDEPENDENT_AMBULATORY_CARE_PROVIDER_SITE_OTHER): Payer: Self-pay

## 2023-06-22 DIAGNOSIS — Z6841 Body Mass Index (BMI) 40.0 and over, adult: Secondary | ICD-10-CM | POA: Diagnosis not present

## 2023-06-22 DIAGNOSIS — M48062 Spinal stenosis, lumbar region with neurogenic claudication: Secondary | ICD-10-CM | POA: Diagnosis not present

## 2023-06-22 DIAGNOSIS — F1120 Opioid dependence, uncomplicated: Secondary | ICD-10-CM | POA: Diagnosis not present

## 2023-06-30 ENCOUNTER — Ambulatory Visit: Payer: Medicare Other | Admitting: Gastroenterology

## 2023-07-05 ENCOUNTER — Encounter: Payer: Self-pay | Admitting: Internal Medicine

## 2023-07-05 ENCOUNTER — Ambulatory Visit (INDEPENDENT_AMBULATORY_CARE_PROVIDER_SITE_OTHER): Payer: Medicare Other | Admitting: Internal Medicine

## 2023-07-05 VITALS — BP 138/72 | HR 85 | Temp 98.1°F | Ht 59.0 in | Wt 235.0 lb

## 2023-07-05 DIAGNOSIS — E559 Vitamin D deficiency, unspecified: Secondary | ICD-10-CM | POA: Diagnosis not present

## 2023-07-05 DIAGNOSIS — H6122 Impacted cerumen, left ear: Secondary | ICD-10-CM | POA: Diagnosis not present

## 2023-07-05 DIAGNOSIS — E119 Type 2 diabetes mellitus without complications: Secondary | ICD-10-CM | POA: Insufficient documentation

## 2023-07-05 DIAGNOSIS — Z0001 Encounter for general adult medical examination with abnormal findings: Secondary | ICD-10-CM

## 2023-07-05 DIAGNOSIS — D509 Iron deficiency anemia, unspecified: Secondary | ICD-10-CM

## 2023-07-05 DIAGNOSIS — R531 Weakness: Secondary | ICD-10-CM

## 2023-07-05 DIAGNOSIS — E785 Hyperlipidemia, unspecified: Secondary | ICD-10-CM | POA: Insufficient documentation

## 2023-07-05 DIAGNOSIS — E538 Deficiency of other specified B group vitamins: Secondary | ICD-10-CM

## 2023-07-05 DIAGNOSIS — E1165 Type 2 diabetes mellitus with hyperglycemia: Secondary | ICD-10-CM | POA: Diagnosis not present

## 2023-07-05 DIAGNOSIS — E7800 Pure hypercholesterolemia, unspecified: Secondary | ICD-10-CM

## 2023-07-05 DIAGNOSIS — Z23 Encounter for immunization: Secondary | ICD-10-CM | POA: Diagnosis not present

## 2023-07-05 DIAGNOSIS — D649 Anemia, unspecified: Secondary | ICD-10-CM | POA: Insufficient documentation

## 2023-07-05 DIAGNOSIS — N1832 Chronic kidney disease, stage 3b: Secondary | ICD-10-CM

## 2023-07-05 DIAGNOSIS — I10 Essential (primary) hypertension: Secondary | ICD-10-CM

## 2023-07-05 LAB — URINALYSIS, ROUTINE W REFLEX MICROSCOPIC
Bilirubin Urine: NEGATIVE
Hgb urine dipstick: NEGATIVE
Ketones, ur: NEGATIVE
Leukocytes,Ua: NEGATIVE
Nitrite: NEGATIVE
RBC / HPF: NONE SEEN (ref 0–?)
Specific Gravity, Urine: 1.01 (ref 1.000–1.030)
Total Protein, Urine: NEGATIVE
Urine Glucose: NEGATIVE
Urobilinogen, UA: 0.2 (ref 0.0–1.0)
pH: 6 (ref 5.0–8.0)

## 2023-07-05 LAB — CBC WITH DIFFERENTIAL/PLATELET
Basophils Absolute: 0.1 10*3/uL (ref 0.0–0.1)
Basophils Relative: 0.8 % (ref 0.0–3.0)
Eosinophils Absolute: 0.5 10*3/uL (ref 0.0–0.7)
Eosinophils Relative: 5.8 % — ABNORMAL HIGH (ref 0.0–5.0)
HCT: 37.2 % (ref 36.0–46.0)
Hemoglobin: 12 g/dL (ref 12.0–15.0)
Lymphocytes Relative: 25 % (ref 12.0–46.0)
Lymphs Abs: 2 10*3/uL (ref 0.7–4.0)
MCHC: 32.2 g/dL (ref 30.0–36.0)
MCV: 89.1 fl (ref 78.0–100.0)
Monocytes Absolute: 0.6 10*3/uL (ref 0.1–1.0)
Monocytes Relative: 8 % (ref 3.0–12.0)
Neutro Abs: 4.8 10*3/uL (ref 1.4–7.7)
Neutrophils Relative %: 60.4 % (ref 43.0–77.0)
Platelets: 314 10*3/uL (ref 150.0–400.0)
RBC: 4.17 Mil/uL (ref 3.87–5.11)
RDW: 14.1 % (ref 11.5–15.5)
WBC: 8 10*3/uL (ref 4.0–10.5)

## 2023-07-05 LAB — BASIC METABOLIC PANEL
BUN: 33 mg/dL — ABNORMAL HIGH (ref 6–23)
CO2: 30 meq/L (ref 19–32)
Calcium: 9.8 mg/dL (ref 8.4–10.5)
Chloride: 102 meq/L (ref 96–112)
Creatinine, Ser: 1.19 mg/dL (ref 0.40–1.20)
GFR: 44.44 mL/min — ABNORMAL LOW (ref >60.00)
Glucose, Bld: 108 mg/dL — ABNORMAL HIGH (ref 70–99)
Potassium: 4.6 meq/L (ref 3.5–5.1)
Sodium: 141 meq/L (ref 135–145)

## 2023-07-05 LAB — VITAMIN D 25 HYDROXY (VIT D DEFICIENCY, FRACTURES): VITD: 55.64 ng/mL (ref 30.00–100.00)

## 2023-07-05 LAB — HEMOGLOBIN A1C: Hgb A1c MFr Bld: 6.5 % (ref 4.6–6.5)

## 2023-07-05 LAB — LIPID PANEL
Cholesterol: 176 mg/dL (ref 0–200)
HDL: 54.5 mg/dL (ref >39.00)
LDL Cholesterol: 89 mg/dL (ref 0–99)
NonHDL: 121.18
Total CHOL/HDL Ratio: 3
Triglycerides: 159 mg/dL — ABNORMAL HIGH (ref 0.0–149.0)
VLDL: 31.8 mg/dL (ref 0.0–40.0)

## 2023-07-05 LAB — HEPATIC FUNCTION PANEL
ALT: 18 U/L (ref 0–35)
AST: 21 U/L (ref 0–37)
Albumin: 4 g/dL (ref 3.5–5.2)
Alkaline Phosphatase: 116 U/L (ref 39–117)
Bilirubin, Direct: 0.1 mg/dL (ref 0.0–0.3)
Total Bilirubin: 0.4 mg/dL (ref 0.2–1.2)
Total Protein: 7.5 g/dL (ref 6.0–8.3)

## 2023-07-05 LAB — VITAMIN B12: Vitamin B-12: 1330 pg/mL — ABNORMAL HIGH (ref 211–911)

## 2023-07-05 LAB — MICROALBUMIN / CREATININE URINE RATIO
Creatinine,U: 115.9 mg/dL
Microalb Creat Ratio: 9.8 mg/g (ref 0.0–30.0)
Microalb, Ur: 11.4 mg/dL — ABNORMAL HIGH (ref 0.0–1.9)

## 2023-07-05 LAB — TSH: TSH: 0.69 u[IU]/mL (ref 0.35–5.50)

## 2023-07-05 NOTE — Patient Instructions (Signed)
Your left ear is irrigated today  Please continue all other medications as before, and refills have been done if requested.  Please have the pharmacy call with any other refills you may need.  Please continue your efforts at being more active, low cholesterol diet, and weight control.  You are otherwise up to date with prevention measures today.  Please keep your appointments with your specialists as you may have planned  You will be contacted regarding the referral for: Physical Therapy  Please go to the LAB at the blood drawing area for the tests to be done  You will be contacted by phone if any changes need to be made immediately.  Otherwise, you will receive a letter about your results with an explanation, but please check with MyChart first.  Please make an Appointment to return in 6 months, or sooner if needed

## 2023-07-05 NOTE — Progress Notes (Signed)
The test results show that your current treatment is OK, as the tests are stable.  Please continue the same plan.  There is no other need for change of treatment or further evaluation based on these results, at this time.  thanks

## 2023-07-05 NOTE — Progress Notes (Unsigned)
Patient ID: [name], female   DOB: 1946-05-14, 77 y.o.   MRN: [id number]         Chief Complaint:: wellness exam and general weakness, left cerumen impaction, dm, hld, anemia       HPI:  [name] is a 77 y.o. female here for wellness exam; plans to call for eye exam soon, o/w up to date                        Also Pt denies chest pain, increased sob or doe, wheezing, orthopnea, PND, increased LE swelling, palpitations, dizziness or syncope.   Pt denies polydipsia, polyuria, or new focal neuro s/s.    Pt denies fever, wt loss, night sweats, loss of appetite, or other constitutional symptoms  Does have worsening generalized weakness in the past 1-2 mo, sedentary at home.  Has reduced hearing left ear for several weeks.     Wt Readings from Last 3 Encounters:  07/05/23 235 lb (106.6 kg)  04/07/23 232 lb 12.8 oz (105.6 kg)  11/16/22 229 lb (103.9 kg)   BP Readings from Last 3 Encounters:  07/05/23 138/72  04/07/23 122/65  11/16/22 130/76   Immunization History  Administered Date(s) Administered   Fluad Quad(high Dose 65+) 07/12/2020, 07/14/2021, 11/16/2022   Fluad Trivalent(High Dose 65+) 07/05/2023   Influenza Split 09/30/2012   Influenza, High Dose Seasonal PF 08/01/2013, 08/11/2016, 07/26/2017, 06/24/2018, 07/11/2019   Influenza,inj,Quad PF,6+ Mos 07/26/2015   Moderna Sars-Covid-2 Vaccination 11/25/2019, 12/18/2019, 08/05/2020   Pneumococcal Conjugate-13 03/15/2014   Pneumococcal Polysaccharide-23 01/30/2013   Tdap 01/18/1999, 04/13/2018, 06/20/2022   Health Maintenance Due  Topic Date Due   OPHTHALMOLOGY EXAM  Never done      Past Medical History:  Diagnosis Date   Allergic rhinitis, cause unspecified    Allergy    SEASONAL   Anemia    Cataract    Bilateral   CHF (congestive heart failure) (HCC)    Chronic headaches    Constipation    Diverticulosis    DJD (degenerative joint disease), lumbar    GERD (gastroesophageal reflux disease)     Hemorrhoids    Hiatal hernia    HTN (hypertension)    Hyperlipidemia    Obesity    Osteoarthritis    Tubular adenoma of colon    Past Surgical History:  Procedure Laterality Date   ABLATION     uterus   COLONOSCOPY  05/15/2021   [place]   TOTAL KNEE ARTHROPLASTY Right 04/14/2019   Procedure: TOTAL KNEE ARTHROPLASTY;  Surgeon: Jodi Geralds, MD;  Location: [facility];  Service: Orthopedics;  Laterality: Right;   TUBAL LIGATION      reports that she has never smoked. She has never been exposed to tobacco smoke. She has never used smokeless tobacco. She reports that she does not drink alcohol and does not use drugs. family history includes Breast cancer in her sister; Diabetes in her brother; Heart disease in her mother; Hypertension in her mother. Allergies  Allergen Reactions   Doxycycline Itching    Rash, itching   Oxycodone    Soybean-Containing Drug Products Other (See Comments)    Allergy testing positive for soy beans but pt uses soy sauce   Sulfonamide Derivatives Itching and Rash   Current Outpatient Medications on File Prior to Visit  Medication Sig Dispense Refill   aspirin-acetaminophen-caffeine (EXCEDRIN MIGRAINE) 250-250-65 MG tablet Take 1 tablet by mouth every 6 (six) hours as needed for headache.  atorvastatin (LIPITOR) 40 MG tablet Take 1 tablet (40 mg total) by mouth daily. 90 tablet 3   azelastine (ASTELIN) 0.1 % nasal spray Place 1 spray into both nostrils 2 (two) times daily. Use in each nostril as directed 90 mL 4   azelastine (OPTIVAR) 0.05 % ophthalmic solution Place 1 drop into both eyes 2 (two) times daily. 6 mL 12   betamethasone, augmented, (DIPROLENE) 0.05 % lotion Apply topically daily as needed.     cetirizine (ZYRTEC) 10 MG tablet Take 1 tablet (10 mg total) by mouth daily. As needed (Patient taking differently: Take 10 mg by mouth daily as needed for allergies. As needed) 90 tablet 3   Cholecalciferol 50 MCG (2000 UT) TABS 1 tab by mouth once daily 30  tablet 99   diltiazem (DILACOR XR) 240 MG 24 hr capsule Take 1 capsule (240 mg total) by mouth daily. 90 capsule 3   DULoxetine (CYMBALTA) 20 MG capsule TAKE 2 CAPSULES EVERY DAY 180 capsule 3   ferrous sulfate 325 (65 FE) MG tablet Take 1 tablet (325 mg total) by mouth 2 (two) times daily with a meal.  3   furosemide (LASIX) 80 MG tablet Take 1 tablet (80 mg total) by mouth 2 (two) times daily. 180 tablet 3   gabapentin (NEURONTIN) 100 MG capsule TAKE 2 CAPSULES AT BEDTIME 180 capsule 1   HYDROcodone-acetaminophen (NORCO/VICODIN) 5-325 MG tablet Take 0.5 tablets by mouth daily as needed.     hydrOXYzine (ATARAX) 10 MG tablet Take 1 tablet (10 mg total) by mouth at bedtime as needed for itching. 60 tablet 2   losartan (COZAAR) 100 MG tablet Take 1 tablet (100 mg total) by mouth daily. 90 tablet 3   metoprolol tartrate (LOPRESSOR) 50 MG tablet Take 1 tablet (50 mg total) by mouth 2 (two) times daily. 180 tablet 3   ondansetron (ZOFRAN) 4 MG tablet Take 1 tablet (4 mg total) by mouth every 8 (eight) hours as needed for nausea or vomiting. 40 tablet 1   pantoprazole (PROTONIX) 40 MG tablet Take 1 tablet (40 mg total) by mouth daily. 90 tablet 3   pantoprazole (PROTONIX) 40 MG tablet TAKE 1 TABLET(40 MG) BY MOUTH DAILY 90 tablet 0   Polyethyl Glycol-Propyl Glycol (SYSTANE OP) Place 1 drop into both eyes 3 (three) times daily as needed (dry eyes).     Polyethylene Glycol 3350 (MIRALAX PO) Take 17 g by mouth daily as needed (constipation).      potassium chloride (KLOR-CON M) 10 MEQ tablet Take 1 tablet (10 mEq total) by mouth daily. 90 tablet 3   promethazine-dextromethorphan (PROMETHAZINE-DM) 6.25-15 MG/5ML syrup Take 5 mLs by mouth 4 (four) times daily as needed for cough. 118 mL 0   tiZANidine (ZANAFLEX) 2 MG tablet TAKE 1 TABLET EVERY 6 HOURS AS NEEDED FOR MUSCLE SPASMS 60 tablet 3   triamcinolone (NASACORT) 55 MCG/ACT AERO nasal inhaler Place 2 sprays into the nose daily. 1 each 12   triamcinolone  cream (KENALOG) 0.1 % Apply 1 application topically 2 (two) times daily. 453 g 0   vitamin B-12 (CYANOCOBALAMIN) 1000 MCG tablet Take 1,000 mcg by mouth daily.     No current facility-administered medications on file prior to visit.        ROS:  All others reviewed and negative.  Objective        PE:  BP 138/72 (BP Location: Right Arm, Patient Position: Sitting, Cuff Size: Normal)   Pulse 85   Temp 98.1 F (36.7  C) (Oral)   Ht 4\' 11"  (1.499 m)   Wt 235 lb (106.6 kg)   SpO2 98%   BMI 47.46 kg/m                 Constitutional: Pt appears in NAD               HENT: Head: NCAT.                Right Ear: External ear normal.                 Left Ear: External ear normal. Left cerumen impaction resolved with irrigation                Eyes: . Pupils are equal, round, and reactive to light. Conjunctivae and EOM are normal               Nose: without d/c or deformity               Neck: Neck supple. Gross normal ROM               Cardiovascular: Normal rate and regular rhythm.                 Pulmonary/Chest: Effort normal and breath sounds without rales or wheezing.                Abd:  Soft, NT, ND, + BS, no organomegaly               Neurological: Pt is alert. At baseline orientation, motor grossly intact               Skin: Skin is warm. No rashes, no other new lesions, LE edema - none               Psychiatric: Pt behavior is normal without agitation   Micro: none  Cardiac tracings I have personally interpreted today:  none  Pertinent Radiological findings (summarize): none   Lab Results  Component Value Date   WBC 8.0 07/05/2023   HGB 12.0 07/05/2023   HCT 37.2 07/05/2023   PLT 314.0 07/05/2023   GLUCOSE 108 (H) 07/05/2023   CHOL 176 07/05/2023   TRIG 159.0 (H) 07/05/2023   HDL 54.50 07/05/2023   LDLDIRECT 93.0 01/14/2021   LDLCALC 89 07/05/2023   ALT 18 07/05/2023   AST 21 07/05/2023   NA 141 07/05/2023   K 4.6 07/05/2023   CL 102 07/05/2023   CREATININE 1.19  07/05/2023   BUN 33 (H) 07/05/2023   CO2 30 07/05/2023   TSH 0.69 07/05/2023   INR 1.2 08/13/2020   HGBA1C 6.5 07/05/2023   MICROALBUR 11.4 (H) 07/05/2023   Assessment/Plan:  [name] is a 77 y.o. Black or African American [2] female with  has a past medical history of Allergic rhinitis, cause unspecified, Allergy, Anemia, Cataract, CHF (congestive heart failure) (HCC), Chronic headaches, Constipation, Diverticulosis, DJD (degenerative joint disease), lumbar, GERD (gastroesophageal reflux disease), Hemorrhoids, Hiatal hernia, HTN (hypertension), Hyperlipidemia, Obesity, Osteoarthritis, and Tubular adenoma of colon.  Encounter for well adult exam with abnormal findings Age and sex appropriate education and counseling updated with regular exercise and diet Referrals for preventative services - to call for eye exam soon Immunizations addressed - none needed Smoking counseling  - none needed Evidence for depression or other mood disorder - none significant Most recent labs reviewed. I have personally reviewed and have noted: 1) the patient's medical and social history  2) The patient's current medications and supplements 3) The patient's height, weight, and BMI have been recorded in the chart   Cerumen impaction Improved with irrigation,  to f/u any worsening symptoms or concerns  Ceruminosis is noted.  Wax is removed by syringing and manual debridement. Instructions for home care to prevent wax buildup are given.   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  Oliver Barre, MD 07/08/2023 5:35 AM [facility]

## 2023-07-08 ENCOUNTER — Encounter: Payer: Self-pay | Admitting: Internal Medicine

## 2023-07-08 DIAGNOSIS — R531 Weakness: Secondary | ICD-10-CM | POA: Insufficient documentation

## 2023-07-08 NOTE — Assessment & Plan Note (Signed)
Lab Results  Component Value Date   HGBA1C 6.5 07/05/2023   Stable, pt to continue current medical treatment  - diet, wt control

## 2023-07-08 NOTE — Assessment & Plan Note (Signed)
Lab Results  Component Value Date   LDLCALC 89 07/05/2023   Uncontrolled, goal ldl < 70, pt to continue current statin lipitor 40 every day, for lower chol diet, o/w declines change today

## 2023-07-08 NOTE — Assessment & Plan Note (Signed)
Also for refer PT

## 2023-07-08 NOTE — Assessment & Plan Note (Signed)
Improved with irrigation,  to f/u any worsening symptoms or concerns  Ceruminosis is noted.  Wax is removed by syringing and manual debridement. Instructions for home care to prevent wax buildup are given.

## 2023-07-08 NOTE — Assessment & Plan Note (Signed)
No overt bleeding, for f/u lab and iron

## 2023-07-08 NOTE — Assessment & Plan Note (Signed)
Last vitamin D Lab Results  Component Value Date   VD25OH 55.64 07/05/2023   Stable, cont oral replacement

## 2023-07-08 NOTE — Assessment & Plan Note (Signed)
Lab Results  Component Value Date   CREATININE 1.19 07/05/2023   Stable overall, cont to avoid nephrotoxins

## 2023-07-08 NOTE — Assessment & Plan Note (Signed)
Age and sex appropriate education and counseling updated with regular exercise and diet Referrals for preventative services - to call for eye exam soon Immunizations addressed - none needed Smoking counseling  - none needed Evidence for depression or other mood disorder - none significant Most recent labs reviewed. I have personally reviewed and have noted: 1) the patient's medical and social history 2) The patient's current medications and supplements 3) The patient's height, weight, and BMI have been recorded in the chart

## 2023-07-08 NOTE — Assessment & Plan Note (Signed)
BP Readings from Last 3 Encounters:  07/05/23 138/72  04/07/23 122/65  11/16/22 130/76   Stable, pt to continue medical treatment dilacor xr 240 every day, losartan 100 every day, lopressor 50 bid

## 2023-07-09 ENCOUNTER — Other Ambulatory Visit: Payer: Self-pay

## 2023-07-09 MED ORDER — GABAPENTIN 100 MG PO CAPS: ORAL_CAPSULE | ORAL | 1 refills | Status: DC

## 2023-07-16 ENCOUNTER — Other Ambulatory Visit: Payer: Self-pay

## 2023-07-16 ENCOUNTER — Telehealth: Payer: Self-pay | Admitting: Internal Medicine

## 2023-07-16 MED ORDER — GABAPENTIN 100 MG PO CAPS: 200.0000 mg | ORAL_CAPSULE | Freq: Every day | ORAL | 1 refills | Status: DC

## 2023-07-16 MED ORDER — GABAPENTIN 100 MG PO CAPS: ORAL_CAPSULE | ORAL | 1 refills | Status: DC

## 2023-07-16 NOTE — Telephone Encounter (Signed)
Tried twice - rx not going through  [facility] to ask pt if she has different pharmacy to send, please

## 2023-07-16 NOTE — Telephone Encounter (Signed)
Refill sent.

## 2023-07-16 NOTE — Telephone Encounter (Signed)
Prescription Request  07/16/2023  LOV: 07/05/2023  What is the name of the medication or equipment? gabapentin  Have you contacted your pharmacy to request a refill? Yes   Which pharmacy would you like this sent to?  Phone: [phone] Fax: [phone]  [facility] [place] - [place], [place] - [address] [place] [address] Phone: [phone] Fax: [phone]    Patient notified that their request is being sent to the clinical staff for review and that they should receive a response within 2 business days.   Please advise at Mobile [phone] (mobile)

## 2023-07-20 DIAGNOSIS — I872 Venous insufficiency (chronic) (peripheral): Secondary | ICD-10-CM | POA: Diagnosis not present

## 2023-07-20 DIAGNOSIS — L853 Xerosis cutis: Secondary | ICD-10-CM | POA: Diagnosis not present

## 2023-07-20 DIAGNOSIS — L218 Other seborrheic dermatitis: Secondary | ICD-10-CM | POA: Diagnosis not present

## 2023-07-26 NOTE — Telephone Encounter (Signed)
Patient said it could be sent to the [facility] on [address] in [place]. Best callback is [phone].

## 2023-07-27 ENCOUNTER — Other Ambulatory Visit: Payer: Self-pay

## 2023-07-27 MED ORDER — GABAPENTIN 100 MG PO CAPS: 200.0000 mg | ORAL_CAPSULE | Freq: Every day | ORAL | 1 refills | Status: DC

## 2023-07-27 NOTE — Telephone Encounter (Signed)
Refill sent.

## 2023-07-28 NOTE — Therapy (Signed)
OUTPATIENT PHYSICAL THERAPY THORACOLUMBAR EVALUATION   Patient Name: [name] MRN: [id number] DOB:[birth year], 77 y.o., female Today's Date: 07/28/2023  END OF SESSION:   Past Medical History:  Diagnosis Date   Allergic rhinitis, cause unspecified    Allergy    SEASONAL   Anemia    Cataract    Bilateral   CHF (congestive heart failure) (HCC)    Chronic headaches    Constipation    Diverticulosis    DJD (degenerative joint disease), lumbar    GERD (gastroesophageal reflux disease)    Hemorrhoids    Hiatal hernia    HTN (hypertension)    Hyperlipidemia    Obesity    Osteoarthritis    Tubular adenoma of colon    Past Surgical History:  Procedure Laterality Date   ABLATION     uterus   COLONOSCOPY  05/15/2021   Pyrtle   TOTAL KNEE ARTHROPLASTY Right 04/14/2019   Procedure: TOTAL KNEE ARTHROPLASTY;  Surgeon: Jodi Geralds, MD;  Location: [facility];  Service: Orthopedics;  Laterality: Right;   TUBAL LIGATION     Patient Active Problem List   Diagnosis Date Noted   Generalized weakness 07/08/2023   Diabetes (HCC) 07/05/2023   HLD (hyperlipidemia) 07/05/2023   Cerumen impaction 12/21/2022   Conductive hearing loss, bilateral 12/21/2022   Cough productive of yellow sputum 08/18/2022   LRTI (lower respiratory tract infection) 08/18/2022   Allergic rhinitis 01/24/2022   COVID-19 virus infection 11/23/2021   CKD (chronic kidney disease) stage 3, GFR 30-59 ml/min (HCC) 07/14/2021   Vitamin D deficiency 07/14/2021   Pain and swelling of lower leg, left 05/15/2021   Cellulitis of lower leg 01/14/2021   Sepsis due to cellulitis (HCC) 08/13/2020   Cellulitis of right lower extremity 08/13/2020   Injury of back due to fall 08/13/2020   Microcytic anemia 08/13/2020   Lactic acidosis 08/13/2020   Sepsis (HCC) 08/13/2020   Aortic atherosclerosis (HCC) 08/07/2020   Itching 08/07/2020   Leg skin lesion, left 08/07/2020   Cellulitis of left leg 01/11/2020   Pulmonary  embolism (HCC) 12/28/2019   Leg wound, left 11/12/2019   Insomnia 09/23/2019   Peripheral edema 07/20/2019   Primary osteoarthritis of right knee 04/14/2019   Acute bursitis of right shoulder 08/16/2017   Abnormal CXR 07/26/2017   Unexplained night sweats 07/26/2017   Greater trochanteric bursitis of right hip 06/23/2017   Sweating abnormality 03/09/2017   Gingivitis 03/09/2017   Herpes zoster without complication 02/10/2017   Trapezoid ligament sprain, right, initial encounter 12/11/2016   Fasciculation 12/11/2016   Acute bronchitis 11/16/2016   Degenerative joint disease of left shoulder 10/07/2016   Degenerative arthritis of knee, bilateral 09/16/2016   Lumbar stenosis with neurogenic claudication 06/17/2016   Compression fracture of L1 lumbar vertebra (HCC) 03/31/2016   Pelvic mass in female 03/31/2016   Urinary frequency 02/27/2016   Chronic maxillary sinusitis 06/06/2015   Carpal tunnel syndrome 09/18/2014   Sprain of ankle 07/04/2014   Primary localized osteoarthrosis, lower leg 05/21/2014   Left knee pain 03/15/2014   Food allergy 10/05/2013   Eustachian tube dysfunction, left 04/24/2013   Degenerative arthritis of left knee 04/24/2013   Hot flashes 04/24/2013   External otitis of left ear 12/15/2012   Spinal stenosis of lumbar region 04/25/2012   Seasonal and perennial allergic rhinitis 03/18/2012   Hearing loss in right ear 03/18/2012   Esophageal dysphagia 02/11/2012   Dysphagia 01/20/2012   Rash 01/20/2012   Bilateral knee pain 01/20/2012  Encounter for well adult exam with abnormal findings 01/14/2012   GERD (gastroesophageal reflux disease) 01/14/2012   Essential (primary) hypertension 01/14/2012   Mixed hyperlipidemia 01/14/2012   Body mass index (BMI) 39.0-39.9, adult 01/14/2012   Osteoarthritis 01/14/2012   DJD (degenerative joint disease), lumbar 01/14/2012   Palpitations 11/21/2010   Dyspnea on exertion 11/21/2010    PCP: Corwin Levins,  MD  REFERRING PROVIDER: Corwin Levins, MD  REFERRING DIAG: R53.1 (ICD-10-CM) - Generalized weakness  Rationale for Evaluation and Treatment: Rehabilitation  THERAPY DIAG:  No diagnosis found.  ONSET DATE: ***  SUBJECTIVE:                                                                                                                                                                                           SUBJECTIVE STATEMENT: ***  PERTINENT HISTORY:  HTN, PE, aortic atherosclerosis, DM, CKD3, DOE, palpitations, CHF, headaches  PAIN:  Are you having pain? {OPRCPAIN:27236}  PRECAUTIONS: {Therapy precautions:24002}  WEIGHT BEARING RESTRICTIONS: No  FALLS:  Has patient fallen in last 6 months? {fallsyesno:27318}  LIVING ENVIRONMENT: Lives with: {[facility] lives with:25569::"lives with their family"} Lives in: {Lives in:25570} Stairs: {opstairs:27293} Has following equipment at home: {Assistive devices:23999}  OCCUPATION: ***  PLOF: {PLOF:24004}  PATIENT GOALS: ***  NEXT MD VISIT: ***  OBJECTIVE:  Note: Objective measures were completed at Evaluation unless otherwise noted.  DIAGNOSTIC FINDINGS:  No recent imaging in chart  PATIENT SURVEYS:  FOTO ***   COGNITION: Overall cognitive status: {cognition:24006}     SENSATION: {sensation:27233}  POSTURE: {posture:25561}  PALPATION: Not indicated   RANGE OF MOTION:     Active  Right eval Left eval  Shoulder flexion    Functional ER combo    Functional IR combo    Knee extension    Ankle dorsiflexion     (Blank rows = not tested) (Key: WFL = within functional limits not formally assessed, * = concordant pain, s = stiffness/stretching sensation, NT = not tested)  Comments:    STRENGTH TESTING:  MMT Right eval Left eval  Shoulder flexion    Shoulder abduction    Elbow flexion    Elbow extension    Grip strength (dynamometer)    Hip flexion    Hip abduction (modified sitting)    Knee flexion     Knee extension    Ankle dorsiflexion    Ankle plantarflexion     (Blank rows = not tested) (Key: WFL = within functional limits not formally assessed, * = concordant pain, s = stiffness/stretching sensation, NT = not tested)  Comments:    FUNCTIONAL TESTS:  30secSTS: ***  TUG: ***  : ***   GAIT: Distance walked: *** Assistive device utilized: {Assistive devices:23999} Level of assistance: {Levels of assistance:24026} Comments: ***  TODAY'S TREATMENT:                                                                                                                              [facility] Adult PT Treatment:                                                DATE: 07/29/23 Therapeutic Exercise: *** Manual Therapy: *** Neuromuscular re-ed: *** Therapeutic Activity: *** Modalities: *** Self Care: ***    PATIENT EDUCATION:  Education details: *** Person educated: {Person educated:25204} Education method: {Education Method:25205} Education comprehension: {Education Comprehension:25206}  HOME EXERCISE PROGRAM: ***  ASSESSMENT:  CLINICAL IMPRESSION: Patient is a 77 y.o. woman who was seen today for physical therapy evaluation and treatment for generalized weakness. ***    OBJECTIVE IMPAIRMENTS: {opptimpairments:25111}.   ACTIVITY LIMITATIONS: {activitylimitations:27494}  PARTICIPATION LIMITATIONS: {participationrestrictions:25113}  PERSONAL FACTORS: {Personal factors:25162} are also affecting patient's functional outcome.   REHAB POTENTIAL: {rehabpotential:25112}  CLINICAL DECISION MAKING: {clinical decision making:25114}  EVALUATION COMPLEXITY: {Evaluation complexity:25115}   GOALS: Goals reviewed with patient? {yes/no:20286}  SHORT TERM GOALS: Target date: ***  Pt will demonstrate appropriate understanding and performance of initially prescribed HEP in order to facilitate improved independence with management of symptoms.  Baseline: HEP provided on eval Goal  status: INITIAL   2. Pt will score greater than or equal to *** on FOTO in order to demonstrate improved perception of function due to symptoms.  Baseline: ***  Goal status: INITIAL    LONG TERM GOALS: Target date: *** Pt will score *** on FOTO in order to demonstrate improved perception of functional status due to symptoms.  Baseline: *** Goal status: INITIAL  2.  Pt will demonstrate *** lumbar AROM in order to demonstrate improved tolerance to functional movement patterns.   Baseline: *** Goal status: INITIAL  3.  Pt will demonstrate hip MMT of *** in order to demonstrate improved strength for functional movements.  Baseline: *** Goal status: INITIAL  4. Pt will perform 5xSTS in <*** sec in order to demonstrate reduced fall risk and improved functional independence. (MCID of 2.3sec)  Baseline: ***  Goal status: INITIAL   PLAN:  PT FREQUENCY: 1x/week  PT DURATION: 8 weeks  PLANNED INTERVENTIONS: {rehab planned interventions:25118::"Therapeutic exercises","Therapeutic activity","Neuromuscular re-education","Balance training","Gait training","Patient/Family education","Self Care","Joint mobilization"}.  PLAN FOR NEXT SESSION: Review/update HEP PRN. Work on [employer] exercises as appropriate with emphasis on ***. Symptom modification strategies as indicated/appropriate.    Ashley Murrain PT, DPT 07/28/2023 9:53 AM

## 2023-07-29 ENCOUNTER — Encounter: Payer: Self-pay | Admitting: Physical Therapy

## 2023-07-29 ENCOUNTER — Other Ambulatory Visit: Payer: Self-pay

## 2023-07-29 ENCOUNTER — Ambulatory Visit: Payer: Medicare Other | Attending: Internal Medicine | Admitting: Physical Therapy

## 2023-07-29 DIAGNOSIS — R2689 Other abnormalities of gait and mobility: Secondary | ICD-10-CM | POA: Diagnosis not present

## 2023-07-29 DIAGNOSIS — R531 Weakness: Secondary | ICD-10-CM | POA: Diagnosis not present

## 2023-07-29 DIAGNOSIS — M6281 Muscle weakness (generalized): Secondary | ICD-10-CM | POA: Diagnosis not present

## 2023-07-30 DIAGNOSIS — M48062 Spinal stenosis, lumbar region with neurogenic claudication: Secondary | ICD-10-CM | POA: Diagnosis not present

## 2023-08-12 ENCOUNTER — Encounter: Payer: Self-pay | Admitting: Physical Therapy

## 2023-08-12 ENCOUNTER — Ambulatory Visit: Payer: Medicare Other | Admitting: Physical Therapy

## 2023-08-12 DIAGNOSIS — R2689 Other abnormalities of gait and mobility: Secondary | ICD-10-CM

## 2023-08-12 DIAGNOSIS — M6281 Muscle weakness (generalized): Secondary | ICD-10-CM

## 2023-08-12 DIAGNOSIS — R531 Weakness: Secondary | ICD-10-CM | POA: Diagnosis not present

## 2023-08-12 NOTE — Therapy (Signed)
OUTPATIENT PHYSICAL THERAPY TREATMENT NOTE   Patient Name: [name] MRN: [id number] DOB:July 28, 1946, 77 y.o., female Today's Date: 08/12/2023  END OF SESSION:  PT End of Session - 08/12/23 1113     Visit Number 2    Number of Visits 9    Date for PT Re-Evaluation 09/23/23    Authorization Type [employer]    PT Start Time 1115   late check in   PT Stop Time 1144    PT Time Calculation (min) 29 min    Activity Tolerance Patient tolerated treatment well    Behavior During Therapy WFL for tasks assessed/performed              Past Medical History:  Diagnosis Date   Allergic rhinitis, cause unspecified    Allergy    SEASONAL   Anemia    Cataract    Bilateral   CHF (congestive heart failure) (HCC)    Chronic headaches    Constipation    Diverticulosis    DJD (degenerative joint disease), lumbar    GERD (gastroesophageal reflux disease)    Hemorrhoids    Hiatal hernia    HTN (hypertension)    Hyperlipidemia    Obesity    Osteoarthritis    Tubular adenoma of colon    Past Surgical History:  Procedure Laterality Date   ABLATION     uterus   COLONOSCOPY  05/15/2021   Pyrtle   TOTAL KNEE ARTHROPLASTY Right 04/14/2019   Procedure: TOTAL KNEE ARTHROPLASTY;  Surgeon: Jodi Geralds, MD;  Location: [facility];  Service: Orthopedics;  Laterality: Right;   TUBAL LIGATION     Patient Active Problem List   Diagnosis Date Noted   Generalized weakness 07/08/2023   Diabetes (HCC) 07/05/2023   HLD (hyperlipidemia) 07/05/2023   Cerumen impaction 12/21/2022   Conductive hearing loss, bilateral 12/21/2022   Cough productive of yellow sputum 08/18/2022   LRTI (lower respiratory tract infection) 08/18/2022   Allergic rhinitis 01/24/2022   COVID-19 virus infection 11/23/2021   CKD (chronic kidney disease) stage 3, GFR 30-59 ml/min (HCC) 07/14/2021   Vitamin D deficiency 07/14/2021   Pain and swelling of lower leg, left 05/15/2021   Cellulitis of lower leg 01/14/2021    Sepsis due to cellulitis (HCC) 08/13/2020   Cellulitis of right lower extremity 08/13/2020   Injury of back due to fall 08/13/2020   Microcytic anemia 08/13/2020   Lactic acidosis 08/13/2020   Sepsis (HCC) 08/13/2020   Aortic atherosclerosis (HCC) 08/07/2020   Itching 08/07/2020   Leg skin lesion, left 08/07/2020   Cellulitis of left leg 01/11/2020   Pulmonary embolism (HCC) 12/28/2019   Leg wound, left 11/12/2019   Insomnia 09/23/2019   Peripheral edema 07/20/2019   Primary osteoarthritis of right knee 04/14/2019   Acute bursitis of right shoulder 08/16/2017   Abnormal CXR 07/26/2017   Unexplained night sweats 07/26/2017   Greater trochanteric bursitis of right hip 06/23/2017   Sweating abnormality 03/09/2017   Gingivitis 03/09/2017   Herpes zoster without complication 02/10/2017   Trapezoid ligament sprain, right, initial encounter 12/11/2016   Fasciculation 12/11/2016   Acute bronchitis 11/16/2016   Degenerative joint disease of left shoulder 10/07/2016   Degenerative arthritis of knee, bilateral 09/16/2016   Lumbar stenosis with neurogenic claudication 06/17/2016   Compression fracture of L1 lumbar vertebra (HCC) 03/31/2016   Pelvic mass in female 03/31/2016   Urinary frequency 02/27/2016   Chronic maxillary sinusitis 06/06/2015   Carpal tunnel syndrome 09/18/2014   Sprain of ankle  07/04/2014   Primary localized osteoarthrosis, lower leg 05/21/2014   Left knee pain 03/15/2014   Food allergy 10/05/2013   Eustachian tube dysfunction, left 04/24/2013   Degenerative arthritis of left knee 04/24/2013   Hot flashes 04/24/2013   External otitis of left ear 12/15/2012   Spinal stenosis of lumbar region 04/25/2012   Seasonal and perennial allergic rhinitis 03/18/2012   Hearing loss in right ear 03/18/2012   Esophageal dysphagia 02/11/2012   Dysphagia 01/20/2012   Rash 01/20/2012   Bilateral knee pain 01/20/2012   Encounter for well adult exam with abnormal findings  01/14/2012   GERD (gastroesophageal reflux disease) 01/14/2012   Essential (primary) hypertension 01/14/2012   Mixed hyperlipidemia 01/14/2012   Body mass index (BMI) 39.0-39.9, adult 01/14/2012   Osteoarthritis 01/14/2012   DJD (degenerative joint disease), lumbar 01/14/2012   Palpitations 11/21/2010   Dyspnea on exertion 11/21/2010    PCP: Corwin Levins, MD  REFERRING PROVIDER: Corwin Levins, MD  REFERRING DIAG: R53.1 (ICD-10-CM) - Generalized weakness  Rationale for Evaluation and Treatment: Rehabilitation  THERAPY DIAG:  Muscle weakness (generalized)  Other abnormalities of gait and mobility  ONSET DATE: about 2020  SUBJECTIVE:                                                                                                                                                                                          Per eval - Pt states she has felt a bit weaker in her legs ever since her knee replacement, not really worsening but not improving. Denies any cardiac symptoms (chest pain or SOB) but endorses muscular fatigue. Does endorse reduced activity levels as time goes on. Pt enjoys going to church and shopping, working around the house. Difficulty with vacuuming, walking, sweeping.  Reports occasional dizziness that is chronic, seems to be mostly when stooping over (encouraged her to discuss this further with her provider). Also sees a doctor for her chronic back pain.   SUBJECTIVE STATEMENT: 08/12/2023 No pain today. Pt states she has been more active with housework but hasn't been able to try HEP much. No issues after initial eval.   PERTINENT HISTORY:  HTN, PE, aortic atherosclerosis, DM, DOE, palpitations, CHF, headaches  PAIN:  Reports some chronic knee pain BIL, L>R, states fatigue is more limiting.   PRECAUTIONS: Fall  WEIGHT BEARING RESTRICTIONS: No  FALLS:  Has patient fallen in last 6 months? No - does endorse remote history of falling  LIVING ENVIRONMENT: 1  story house, ramp entrance in back, 2STE from front Lives w/ her son who works full time Pt does housework and laundry Pt does not drive, has  transportation with neighbor or [employer] Goad Has been using rollator since the beginning of this year, has been using walker since TKA in 2020, previously using cane  OCCUPATION: retired - used to work in Teaching laboratory technician   PLOF: Independent with community mobility with device  PATIENT GOALS: be more active  NEXT MD VISIT: a couple months per pt report  OBJECTIVE:  Note: Objective measures were completed at Evaluation unless otherwise noted.  DIAGNOSTIC FINDINGS:  No recent imaging in chart  PATIENT SURVEYS:  FOTO 16 current, 45 predicted   COGNITION: Overall cognitive status: Within functional limits for tasks assessed     SENSATION: Reports chronic numbness/tingling in R knee which she states is stable since knee replacement  POSTURE: forward flexed trunk, forward head  PALPATION: Not indicated   RANGE OF MOTION:     Active  Right eval Left eval  Shoulder flexion    Functional ER combo    Functional IR combo    Knee extension    Ankle dorsiflexion     (Blank rows = not tested) (Key: WFL = within functional limits not formally assessed, * = concordant pain, s = stiffness/stretching sensation, NT = not tested)  Comments:    STRENGTH TESTING:  MMT Right eval Left eval  Shoulder flexion    Shoulder abduction    Elbow flexion    Elbow extension    Grip strength (dynamometer)    Hip flexion 4 4-  Hip abduction (modified sitting) 4+ 4+  Knee flexion 4 4  Knee extension 4- 4  Ankle dorsiflexion 4+ 4+  Ankle plantarflexion     (Blank rows = not tested) (Key: WFL = within functional limits not formally assessed, * = concordant pain, s = stiffness/stretching sensation, NT = not tested)  Comments: all MMT nonpainful  FUNCTIONAL TESTS:  30secSTS: 4 repetitions w/ UE support from chair  TUG: 24sec w/ rollator    GAIT: Distance  walked: within clinic  Assistive device utilized: Environmental consultant - 4 wheeled Level of assistance: Modified independence Comments: reduced gait speed/cadence, forward flexed posture, reduced hip extension and clearance BIL  TODAY'S TREATMENT:                                                                                                                              [facility] Adult PT Treatment:                                                DATE: 08/12/23 Therapeutic Exercise: Seated march x12 BIL cues for reduced trunk lean LAQ x12 BIL STS 2x5 from standard chair cues for pacing, UE support as needed Standing heel raise at counter x8 cues for trunk mechanics Seated heel/toe raises 2x8  Standing hip extension at counter 2x5 HEP update + education/handout    [facility] Adult PT Treatment:  DATE: 07/29/23 Therapeutic Exercise: STS, LAQ, marches practice in clinic; HEP handout, education on appropriate performance/safety    PATIENT EDUCATION:  Education details: rationale for interventions, HEP  Person educated: Patient Education method: Explanation, Demonstration, Tactile cues, Verbal cues Education comprehension: verbalized understanding, returned demonstration, verbal cues required, tactile cues required, and needs further education    HOME EXERCISE PROGRAM: Access Code: [email or web address] URL: [email or web address] Date: 08/12/2023 Prepared by: Fransisco Hertz  Exercises - Sit to Stand with Armchair  - 2-3 x daily - 7 x weekly - 1 sets - 5 reps - Seated Long Arc Quad  - 2-3 x daily - 7 x weekly - 1 sets - 10 reps - Seated March  - 2-3 x daily - 7 x weekly - 1 sets - 10 reps - Seated Heel Toe Raises  - 2-3 x daily - 7 x weekly - 1 sets - 8 reps  ASSESSMENT:  CLINICAL IMPRESSION: 08/12/2023 Pt arrives w/ no new complaints, reports limited HEP adherence but has been busier around the house. Session shortened due to late check in. Pt does well  with HEP review, able to progress program to include activities working on standing endurance/strength. No adverse events, pt primarily limited by generalized weakness. Recommend continuing along current POC in order to address relevant deficits and improve functional tolerance. Pt departs today's session in no acute distress, all voiced questions/concerns addressed appropriately from PT perspective.    Per eval - Patient is a pleasant 77 y.o. woman who was seen today for physical therapy evaluation and treatment for generalized weakness ongoing over past few years. She endorses difficulty entering home via steps and is using ramp, endorses reduced activity levels due to fatigue/weakness. On exam, she demonstrates mild weakness BIL (R more affected than L which she attributes to prior TKA), altered mechanics with gait/transfers. Reduced activity tolerance and fall risk both evidenced by 30sec STS test and TUG. No adverse events, pt tolerates activities well in clinic, does well with HEP practice. Recommend trial of skilled PT to address aforementioned deficits with aim of improving functional tolerance and reducing pain with typical activities. Pt departs today's session in no acute distress, all voiced concerns/questions addressed appropriately from PT perspective.     OBJECTIVE IMPAIRMENTS: Abnormal gait, decreased activity tolerance, decreased balance, decreased endurance, decreased mobility, difficulty walking, decreased strength, improper body mechanics, postural dysfunction, and pain.   ACTIVITY LIMITATIONS: carrying, lifting, bending, standing, stairs, transfers, and locomotion level  PARTICIPATION LIMITATIONS: meal prep, cleaning, laundry, shopping, and community activity  PERSONAL FACTORS: Age, Time since onset of injury/illness/exacerbation, and 3+ comorbidities: HTN, hx PE, DM, CHF, headaches  are also affecting patient's functional outcome.   REHAB POTENTIAL: Fair given chronicity and  comorbidities  CLINICAL DECISION MAKING: Stable/uncomplicated  EVALUATION COMPLEXITY: Low   GOALS: Goals reviewed with patient? Yes  SHORT TERM GOALS: Target date: 08/26/2023 Pt will demonstrate appropriate understanding and performance of initially prescribed HEP in order to facilitate improved independence with management of symptoms.  Baseline: HEP provided on eval Goal status: INITIAL   2. Pt will score greater than or equal to 30 on FOTO in order to demonstrate improved perception of function due to symptoms.  Baseline: 16  Goal status: INITIAL    LONG TERM GOALS: Target date: 09/23/2023 Pt will score 45 on FOTO in order to demonstrate improved perception of functional status due to symptoms.  Baseline: 16 Goal status: INITIAL  2. Pt will be able to perform at least 8 repetitions during 30 second sit  to stand test in order to demonstrate improved exercise/activity tolerance (cutoff score for low exercise tolerance 18 repetitions in males and 16 repetitions in females per Georgina Snell al 2024)  Baseline: 4 repetitions, with UE support  Goal status: INITIAL    3.  Pt will be able to perform TUG in less than or equal to 18 sec in order to indicate reduced risk of falling (cutoff score for fall risk 13.5 sec in community dwelling older adults per [facility] et al, 2000)  Baseline: 24 sec with rollator  Goal status: INITIAL    4. Pt will be able to safely navigate up to 2 stairs w/ UE support in order to facilitate improved home access.  Baseline: avoiding stairs, using ramp per pt report  Goal status: INITIAL  5. Pt will demonstrate grossly symmetrical LE MMT of at least 4+/5 throughout to indicate improved functional strength.  Baseline: see MMT chart above  Goal status: INITIAL   PLAN:  PT FREQUENCY: 1x/week  PT DURATION: 8 weeks  PLANNED INTERVENTIONS: Patient/Family education, Balance training, Stair training, Cryotherapy, Moist heat, Therapeutic exercises,  Therapeutic activity, Neuromuscular re-education, Gait training, and Self Care.  PLAN FOR NEXT SESSION: Review/update HEP PRN. Work on [employer] exercises as appropriate with emphasis on functional mobility, activity tolerance, and general LE strength. Symptom modification strategies as indicated/appropriate.    Ashley Murrain PT, DPT 08/12/2023 11:45 AM

## 2023-08-17 ENCOUNTER — Ambulatory Visit: Payer: Medicare Other | Admitting: Physical Therapy

## 2023-08-19 ENCOUNTER — Encounter: Payer: Medicare Other | Admitting: Physical Therapy

## 2023-08-26 ENCOUNTER — Ambulatory Visit: Payer: Medicare Other | Attending: Internal Medicine | Admitting: Physical Therapy

## 2023-08-26 ENCOUNTER — Encounter: Payer: Self-pay | Admitting: Physical Therapy

## 2023-08-26 DIAGNOSIS — M6281 Muscle weakness (generalized): Secondary | ICD-10-CM | POA: Insufficient documentation

## 2023-08-26 DIAGNOSIS — R2689 Other abnormalities of gait and mobility: Secondary | ICD-10-CM | POA: Diagnosis not present

## 2023-08-26 NOTE — Therapy (Signed)
OUTPATIENT PHYSICAL THERAPY TREATMENT NOTE   Patient Name: [name] MRN: [id number] DOB:08-Apr-1946, 77 y.o., female Today's Date: 08/26/2023  END OF SESSION:  PT End of Session - 08/26/23 1446     Visit Number 3    Number of Visits 9    Date for PT Re-Evaluation 09/23/23    Authorization Type [employer]    PT Start Time 1446    PT Stop Time 1526    PT Time Calculation (min) 40 min    Activity Tolerance Patient tolerated treatment well    Behavior During Therapy WFL for tasks assessed/performed               Past Medical History:  Diagnosis Date   Allergic rhinitis, cause unspecified    Allergy    SEASONAL   Anemia    Cataract    Bilateral   CHF (congestive heart failure) (HCC)    Chronic headaches    Constipation    Diverticulosis    DJD (degenerative joint disease), lumbar    GERD (gastroesophageal reflux disease)    Hemorrhoids    Hiatal hernia    HTN (hypertension)    Hyperlipidemia    Obesity    Osteoarthritis    Tubular adenoma of colon    Past Surgical History:  Procedure Laterality Date   ABLATION     uterus   COLONOSCOPY  05/15/2021   [place]   TOTAL KNEE ARTHROPLASTY Right 04/14/2019   Procedure: TOTAL KNEE ARTHROPLASTY;  Surgeon: Jodi Geralds, MD;  Location: [facility];  Service: Orthopedics;  Laterality: Right;   TUBAL LIGATION     Patient Active Problem List   Diagnosis Date Noted   Generalized weakness 07/08/2023   Diabetes (HCC) 07/05/2023   HLD (hyperlipidemia) 07/05/2023   Cerumen impaction 12/21/2022   Conductive hearing loss, bilateral 12/21/2022   Cough productive of yellow sputum 08/18/2022   LRTI (lower respiratory tract infection) 08/18/2022   Allergic rhinitis 01/24/2022   COVID-19 virus infection 11/23/2021   CKD (chronic kidney disease) stage 3, GFR 30-59 ml/min (HCC) 07/14/2021   Vitamin D deficiency 07/14/2021   Pain and swelling of lower leg, left 05/15/2021   Cellulitis of lower leg 01/14/2021   Sepsis due to  cellulitis (HCC) 08/13/2020   Cellulitis of right lower extremity 08/13/2020   Injury of back due to fall 08/13/2020   Microcytic anemia 08/13/2020   Lactic acidosis 08/13/2020   Sepsis (HCC) 08/13/2020   Aortic atherosclerosis (HCC) 08/07/2020   Itching 08/07/2020   Leg skin lesion, left 08/07/2020   Cellulitis of left leg 01/11/2020   Pulmonary embolism (HCC) 12/28/2019   Leg wound, left 11/12/2019   Insomnia 09/23/2019   Peripheral edema 07/20/2019   Primary osteoarthritis of right knee 04/14/2019   Acute bursitis of right shoulder 08/16/2017   Abnormal CXR 07/26/2017   Unexplained night sweats 07/26/2017   Greater trochanteric bursitis of right hip 06/23/2017   Sweating abnormality 03/09/2017   Gingivitis 03/09/2017   Herpes zoster without complication 02/10/2017   Trapezoid ligament sprain, right, initial encounter 12/11/2016   Fasciculation 12/11/2016   Acute bronchitis 11/16/2016   Degenerative joint disease of left shoulder 10/07/2016   Degenerative arthritis of knee, bilateral 09/16/2016   Lumbar stenosis with neurogenic claudication 06/17/2016   Compression fracture of L1 lumbar vertebra (HCC) 03/31/2016   Pelvic mass in female 03/31/2016   Urinary frequency 02/27/2016   Chronic maxillary sinusitis 06/06/2015   Carpal tunnel syndrome 09/18/2014   Sprain of ankle 07/04/2014  Primary localized osteoarthrosis, lower leg 05/21/2014   Left knee pain 03/15/2014   Food allergy 10/05/2013   Eustachian tube dysfunction, left 04/24/2013   Degenerative arthritis of left knee 04/24/2013   Hot flashes 04/24/2013   External otitis of left ear 12/15/2012   Spinal stenosis of lumbar region 04/25/2012   Seasonal and perennial allergic rhinitis 03/18/2012   Hearing loss in right ear 03/18/2012   Esophageal dysphagia 02/11/2012   Dysphagia 01/20/2012   Rash 01/20/2012   Bilateral knee pain 01/20/2012   Encounter for well adult exam with abnormal findings 01/14/2012   GERD  (gastroesophageal reflux disease) 01/14/2012   Essential (primary) hypertension 01/14/2012   Mixed hyperlipidemia 01/14/2012   Body mass index (BMI) 39.0-39.9, adult 01/14/2012   Osteoarthritis 01/14/2012   DJD (degenerative joint disease), lumbar 01/14/2012   Palpitations 11/21/2010   Dyspnea on exertion 11/21/2010    PCP: Corwin Levins, MD  REFERRING PROVIDER: Corwin Levins, MD  REFERRING DIAG: R53.1 (ICD-10-CM) - Generalized weakness  Rationale for Evaluation and Treatment: Rehabilitation  THERAPY DIAG:  Muscle weakness (generalized)  Other abnormalities of gait and mobility  ONSET DATE: about 2020  SUBJECTIVE:                                                                                                                                                                                          Per eval - Pt states she has felt a bit weaker in her legs ever since her knee replacement, not really worsening but not improving. Denies any cardiac symptoms (chest pain or SOB) but endorses muscular fatigue. Does endorse reduced activity levels as time goes on. Pt enjoys going to church and shopping, working around the house. Difficulty with vacuuming, walking, sweeping.  Reports occasional dizziness that is chronic, seems to be mostly when stooping over (encouraged her to discuss this further with her provider). Also sees a doctor for her chronic back pain.   SUBJECTIVE STATEMENT: 08/26/2023 No pain today. Pt states she felt good after last session, has been doing HEP on average 1x/day. Feels like she is starting to feel stronger.    PERTINENT HISTORY:  HTN, PE, aortic atherosclerosis, DM, DOE, palpitations, CHF, headaches  PAIN:  Reports some chronic knee pain BIL, L>R, states fatigue is more limiting.   PRECAUTIONS: Fall  WEIGHT BEARING RESTRICTIONS: No  FALLS:  Has patient fallen in last 6 months? No - does endorse remote history of falling  LIVING ENVIRONMENT: 1 story  house, ramp entrance in back, 2STE from front Lives w/ her son who works full time Pt does housework and laundry Pt does not drive, has transportation  with neighbor or [employer] Goad Has been using rollator since the beginning of this year, has been using walker since TKA in 2020, previously using cane  OCCUPATION: retired - used to work in Teaching laboratory technician   PLOF: Independent with community mobility with device  PATIENT GOALS: be more active  NEXT MD VISIT: a couple months per pt report  OBJECTIVE:  Note: Objective measures were completed at Evaluation unless otherwise noted.  DIAGNOSTIC FINDINGS:  No recent imaging in chart  PATIENT SURVEYS:  FOTO 16 current, 45 predicted   COGNITION: Overall cognitive status: Within functional limits for tasks assessed     SENSATION: Reports chronic numbness/tingling in R knee which she states is stable since knee replacement  POSTURE: forward flexed trunk, forward head  PALPATION: Not indicated   RANGE OF MOTION:     Active  Right eval Left eval  Shoulder flexion    Functional ER combo    Functional IR combo    Knee extension    Ankle dorsiflexion     (Blank rows = not tested) (Key: WFL = within functional limits not formally assessed, * = concordant pain, s = stiffness/stretching sensation, NT = not tested)  Comments:    STRENGTH TESTING:  MMT Right eval Left eval  Shoulder flexion    Shoulder abduction    Elbow flexion    Elbow extension    Grip strength (dynamometer)    Hip flexion 4 4-  Hip abduction (modified sitting) 4+ 4+  Knee flexion 4 4  Knee extension 4- 4  Ankle dorsiflexion 4+ 4+  Ankle plantarflexion     (Blank rows = not tested) (Key: WFL = within functional limits not formally assessed, * = concordant pain, s = stiffness/stretching sensation, NT = not tested)  Comments: all MMT nonpainful  FUNCTIONAL TESTS:  30secSTS: 4 repetitions w/ UE support from chair  TUG: 24sec w/ rollator   08/26/23 TUG: 21 sec w/  rollator; 19sec w/ rollator (just shy of cones), 18sec all the way to cones w rollator   GAIT: Distance walked: within clinic  Assistive device utilized: Environmental consultant - 4 wheeled Level of assistance: Modified independence Comments: reduced gait speed/cadence, forward flexed posture, reduced hip extension and clearance BIL  TODAY'S TREATMENT:                                                                                                                              [facility] Adult PT Treatment:                                                DATE: 08/26/23 Therapeutic Exercise: Standing march 2x10 BIL w rollator , seated rest between sets, cues for posture  Standing hip extension 2x8 BIL seated rest Standing heel raises 2x12 with rollator cues for posture/pacing STS x8 w UE support standard  chair TUG 3 reps for endurance HEP update + education on appropriate performance   [facility] Adult PT Treatment:                                                DATE: 08/12/23 Therapeutic Exercise: Seated march x12 BIL cues for reduced trunk lean LAQ x12 BIL STS 2x5 from standard chair cues for pacing, UE support as needed Standing heel raise at counter x8 cues for trunk mechanics Seated heel/toe raises 2x8  Standing hip extension at counter 2x5 HEP update + education/handout    [facility] Adult PT Treatment:                                                DATE: 07/29/23 Therapeutic Exercise: STS, LAQ, marches practice in clinic; HEP handout, education on appropriate performance/safety    PATIENT EDUCATION:  Education details: rationale for interventions, HEP  Person educated: Patient Education method: Explanation, Demonstration, Tactile cues, Verbal cues Education comprehension: verbalized understanding, returned demonstration, verbal cues required, tactile cues required, and needs further education    HOME EXERCISE PROGRAM: Access Code: [email or web address] URL: [email or web address] Date:  08/26/2023 Prepared by: Fransisco Hertz  Exercises - Sit to Stand with Armchair  - 2-3 x daily - 7 x weekly - 1 sets - 8 reps - Seated Long Arc Quad  - 2-3 x daily - 7 x weekly - 1 sets - 10 reps - Standing March with Counter Support  - 2-3 x daily - 7 x weekly - 1 sets - 8 reps - Heel Raises with Counter Support  - 2-3 x daily - 7 x weekly - 1 sets - 10 reps  ASSESSMENT:  CLINICAL IMPRESSION: 08/26/2023 Pt arrives w/o pain, notes improving strength compared to start of care and improved HEP adherence. TUG time with modest improvement, performing multiple reps for endurance training. Able to progress with more time spent in standing, continuing to provide seated rest PRN. No adverse events, pt denies any pain or significant fatigue on departure. Recommend continuing along current POC in order to address relevant deficits and improve functional tolerance. Pt departs today's session in no acute distress, all voiced questions/concerns addressed appropriately from PT perspective.      Per eval - Patient is a pleasant 77 y.o. woman who was seen today for physical therapy evaluation and treatment for generalized weakness ongoing over past few years. She endorses difficulty entering home via steps and is using ramp, endorses reduced activity levels due to fatigue/weakness. On exam, she demonstrates mild weakness BIL (R more affected than L which she attributes to prior TKA), altered mechanics with gait/transfers. Reduced activity tolerance and fall risk both evidenced by 30sec STS test and TUG. No adverse events, pt tolerates activities well in clinic, does well with HEP practice. Recommend trial of skilled PT to address aforementioned deficits with aim of improving functional tolerance and reducing pain with typical activities. Pt departs today's session in no acute distress, all voiced concerns/questions addressed appropriately from PT perspective.     OBJECTIVE IMPAIRMENTS: Abnormal gait, decreased  activity tolerance, decreased balance, decreased endurance, decreased mobility, difficulty walking, decreased strength, improper body mechanics, postural dysfunction, and pain.   ACTIVITY LIMITATIONS: carrying, lifting, bending, standing, stairs, transfers,  and locomotion level  PARTICIPATION LIMITATIONS: meal prep, cleaning, laundry, shopping, and community activity  PERSONAL FACTORS: Age, Time since onset of injury/illness/exacerbation, and 3+ comorbidities: HTN, hx PE, DM, CHF, headaches  are also affecting patient's functional outcome.   REHAB POTENTIAL: Fair given chronicity and comorbidities  CLINICAL DECISION MAKING: Stable/uncomplicated  EVALUATION COMPLEXITY: Low   GOALS: Goals reviewed with patient? Yes  SHORT TERM GOALS: Target date: 08/26/2023 Pt will demonstrate appropriate understanding and performance of initially prescribed HEP in order to facilitate improved independence with management of symptoms.  Baseline: reports daily adherence Goal status: MET  2. Pt will score greater than or equal to 30 on FOTO in order to demonstrate improved perception of function due to symptoms.  Baseline: 16  08/26/23: deferred given visit 3  Goal status: ONGOING  LONG TERM GOALS: Target date: 09/23/2023 Pt will score 45 on FOTO in order to demonstrate improved perception of functional status due to symptoms.  Baseline: 16 Goal status: INITIAL  2. Pt will be able to perform at least 8 repetitions during 30 second sit to stand test in order to demonstrate improved exercise/activity tolerance (cutoff score for low exercise tolerance 18 repetitions in males and 16 repetitions in females per Georgina Snell al 2024)  Baseline: 4 repetitions, with UE support  Goal status: INITIAL    3.  Pt will be able to perform TUG in less than or equal to 18 sec in order to indicate reduced risk of falling (cutoff score for fall risk 13.5 sec in community dwelling older adults per [facility] et al,  2000)  Baseline: 24 sec with rollator  Goal status: INITIAL    4. Pt will be able to safely navigate up to 2 stairs w/ UE support in order to facilitate improved home access.  Baseline: avoiding stairs, using ramp per pt report  Goal status: INITIAL  5. Pt will demonstrate grossly symmetrical LE MMT of at least 4+/5 throughout to indicate improved functional strength.  Baseline: see MMT chart above  Goal status: INITIAL   PLAN:  PT FREQUENCY: 1x/week  PT DURATION: 8 weeks  PLANNED INTERVENTIONS: Patient/Family education, Balance training, Stair training, Cryotherapy, Moist heat, Therapeutic exercises, Therapeutic activity, Neuromuscular re-education, Gait training, and Self Care.  PLAN FOR NEXT SESSION: Review/update HEP PRN. Work on [employer] exercises as appropriate with emphasis on functional mobility, activity tolerance, and general LE strength. Symptom modification strategies as indicated/appropriate.    Ashley Murrain PT, DPT 08/26/2023 3:33 PM

## 2023-09-02 ENCOUNTER — Ambulatory Visit: Payer: Medicare Other | Admitting: Physical Therapy

## 2023-09-15 ENCOUNTER — Encounter: Payer: Self-pay | Admitting: Gastroenterology

## 2023-09-15 ENCOUNTER — Ambulatory Visit: Payer: Medicare Other | Admitting: Gastroenterology

## 2023-09-15 VITALS — BP 110/70 | HR 73 | Ht 59.0 in | Wt 228.0 lb

## 2023-09-15 DIAGNOSIS — R1310 Dysphagia, unspecified: Secondary | ICD-10-CM | POA: Diagnosis not present

## 2023-09-15 DIAGNOSIS — K5909 Other constipation: Secondary | ICD-10-CM | POA: Diagnosis not present

## 2023-09-15 DIAGNOSIS — Z860101 Personal history of adenomatous and serrated colon polyps: Secondary | ICD-10-CM

## 2023-09-15 DIAGNOSIS — K5900 Constipation, unspecified: Secondary | ICD-10-CM

## 2023-09-15 NOTE — Progress Notes (Signed)
Chief Complaint: follow up of dysphagia Primary GI MD: Dr. 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 Rhea Belton. Patient was having dysphagia she was put on pantoprazole 40 Mg once daily and set up for EGD.  She felt Nexium did not work well for her.  Constipation well-controlled on fiber and MiraLAX  Discussed the use of AI scribe software for clinical note transcription with the patient, who gave verbal consent to proceed.  Patient states she is unsure why she is here today.  States she thinks this is a follow-up appointment.  States since last time she was here her dysphagia has improved s/p dilation.  Though sometimes she still struggles with dysphagia to solids.  She attributes this due to a dental problem and she is in the process of getting her teeth fixed.  She states that is difficult for her to chew and sometimes she will swallow large pieces of food that then gets stuck.  The patient's constipation persists, with bowel movements occurring approximately once or twice a week. She has been using fiber and MiraLAX intermittently for management, not on a consistent basis. She also reports episodes of nausea and vomiting associated with constipation.  The patient is currently on pantoprazole, which she believes has contributed to the slight improvement in her swallowing.     Last night she consumed black cherry ice cream which resulted in 1 episode of diarrhea.  PREVIOUS GI WORKUP   EGD 05/16/2021 for GERD and dysphagia history of IDA  - entire esophagus normal.  No esophageal abnormality to explain patient's dysphagia.  Esophagus dilated with 18 mm balloon - Normal cardia, gastric fundus, and gastric body.  Biopsied - Gastritis with scattered erosions  antrum/prepylorus.  Biopsied - Normal examined duodenum  Colonoscopy 05/15/2021 for history of colon polyps - One 3 mm polyp in the ascending colon, removed with a cold snare. Resected and retrieved.  - Diverticulosis in the sigmoid colon, in the descending colon and in the ascending colon.  - The distal rectum and anal verge are normal on retroflexion view. - no recall due to age  Diagnosis 1. Surgical [P], gastric antrum - HISTOLOGIC CHANGES CONSISTENT WITH REACTIVE GASTROPATHY, SURFACE ULCERATION, AND EPITHELIAL ATYPIA. SEE NOTE. - NEGATIVE FOR HELICOBACTER ORGANISMS ON WARTHIN STARRY SPECIAL STAIN. - NEGATIVE FOR DYSPLASIA OR MALIGNANCY. 2. Surgical [P], gastric body - UNREMARKABLE GASTRIC MUCOSA. - NEGATIVE FOR HELICOBACTER ORGANISMS ON WARTHIN STARRY SPECIAL STAIN. - NEGATIVE FOR DYSPLASIA OR MALIGNANCY. 3. Surgical [P], colon, ascending, polyp (1) - TUBULAR ADENOMA.  Capsule endoscopy for iron deficiency anemia 01/21/2015 - Complete study with good prep - Negative study  EGD 12/10/2014 for iron deficiency anemia 1. The mucosa of the esophagus appeared normal  2. Acute gastritis ( inflammation) was found in the prepyloric region of the stomach and gastric antrum; multiple biopsies were performed . (Mild chronic active gastritis, negative for H. pylori) 3. Few small, benign, sessile polyps were found in the gastric fundus and cardia  4. The duodenal mucosa showed no abnormalities in the bulb and 2nd part of the duodenum  Colonoscopy 12/10/2014 for deficiency anemia 1. The examined terminal ileum appeared to be normal  2. Sessile polyp was found in the descending colon; polypectomy was performed with a cold snare (polypoid mucosa) 3. Mild diverticulosis was  noted in the descending colon and sigmoid colon    Past Medical History:  Diagnosis Date   Allergic rhinitis, cause unspecified    Allergy    SEASONAL   Anemia    Cataract    Bilateral   CHF (congestive heart failure)  (HCC)    Chronic headaches    Constipation    Diverticulosis    DJD (degenerative joint disease), lumbar    GERD (gastroesophageal reflux disease)    Hemorrhoids    Hiatal hernia    HTN (hypertension)    Hyperlipidemia    Obesity    Osteoarthritis    Tubular adenoma of colon     Past Surgical History:  Procedure Laterality Date   ABLATION     uterus   COLONOSCOPY  05/15/2021   [facility]   TOTAL KNEE ARTHROPLASTY Right 04/14/2019   Procedure: TOTAL KNEE ARTHROPLASTY;  Surgeon: Jodi Geralds, MD;  Location: [facility];  Service: Orthopedics;  Laterality: Right;   TUBAL LIGATION      Current Outpatient Medications  Medication Sig Dispense Refill   aspirin-acetaminophen-caffeine (EXCEDRIN MIGRAINE) 250-250-65 MG tablet Take 1 tablet by mouth every 6 (six) hours as needed for headache.     atorvastatin (LIPITOR) 40 MG tablet Take 1 tablet (40 mg total) by mouth daily. 90 tablet 3   azelastine (ASTELIN) 0.1 % nasal spray Place 1 spray into both nostrils 2 (two) times daily. Use in each nostril as directed 90 mL 4   azelastine (OPTIVAR) 0.05 % ophthalmic solution Place 1 drop into both eyes 2 (two) times daily. 6 mL 12   betamethasone, augmented, (DIPROLENE) 0.05 % lotion Apply topically daily as needed.     cetirizine (ZYRTEC) 10 MG tablet Take 1 tablet (10 mg total) by mouth daily. As needed (Patient taking differently: Take 10 mg by mouth daily as needed for allergies. As needed) 90 tablet 3   Cholecalciferol 50 MCG (2000 UT) TABS 1 tab by mouth once daily 30 tablet 99   diltiazem (DILACOR XR) 240 MG 24 hr capsule Take 1 capsule (240 mg total) by mouth daily. 90 capsule 3   DULoxetine (CYMBALTA) 20 MG capsule TAKE 2 CAPSULES EVERY DAY 180 capsule 3   ferrous sulfate 325 (65 FE) MG tablet Take 1 tablet (325 mg total) by mouth 2 (two) times daily with a meal.  3   furosemide (LASIX) 80 MG tablet Take 1 tablet (80 mg total) by mouth 2 (two) times daily. 180 tablet 3   gabapentin (NEURONTIN)  100 MG capsule Take 2 capsules (200 mg total) by mouth at bedtime. 180 capsule 1   HYDROcodone-acetaminophen (NORCO/VICODIN) 5-325 MG tablet Take 0.5 tablets by mouth daily as needed.     hydrOXYzine (ATARAX) 10 MG tablet Take 1 tablet (10 mg total) by mouth at bedtime as needed for itching. 60 tablet 2   losartan (COZAAR) 100 MG tablet Take 1 tablet (100 mg total) by mouth daily. 90 tablet 3   metoprolol tartrate (LOPRESSOR) 50 MG tablet Take 1 tablet (50 mg total) by mouth 2 (two) times daily. 180 tablet 3   ondansetron (ZOFRAN) 4 MG tablet Take 1 tablet (4 mg total) by mouth every 8 (eight) hours as needed for nausea or vomiting. 40 tablet 1   pantoprazole (PROTONIX) 40 MG tablet Take 1 tablet (40 mg total) by mouth daily. 90 tablet 3   pantoprazole (PROTONIX) 40 MG tablet TAKE 1 TABLET(40 MG) BY MOUTH DAILY 90 tablet 0   Polyethyl Glycol-Propyl Glycol (SYSTANE  OP) Place 1 drop into both eyes 3 (three) times daily as needed (dry eyes).     Polyethylene Glycol 3350 (MIRALAX PO) Take 17 g by mouth daily as needed (constipation).      potassium chloride (KLOR-CON M) 10 MEQ tablet Take 1 tablet (10 mEq total) by mouth daily. 90 tablet 3   promethazine-dextromethorphan (PROMETHAZINE-DM) 6.25-15 MG/5ML syrup Take 5 mLs by mouth 4 (four) times daily as needed for cough. 118 mL 0   tiZANidine (ZANAFLEX) 2 MG tablet TAKE 1 TABLET EVERY 6 HOURS AS NEEDED FOR MUSCLE SPASMS 60 tablet 3   triamcinolone (NASACORT) 55 MCG/ACT AERO nasal inhaler Place 2 sprays into the nose daily. 1 each 12   triamcinolone cream (KENALOG) 0.1 % Apply 1 application topically 2 (two) times daily. 453 g 0   vitamin B-12 (CYANOCOBALAMIN) 1000 MCG tablet Take 1,000 mcg by mouth daily.     No current facility-administered medications for this visit.    Allergies as of 09/15/2023 - Review Complete 09/15/2023  Allergen Reaction Noted   Doxycycline Itching 05/15/2022   Oxycodone  07/24/2019   Soybean-containing drug products Other  (See Comments) 11/14/2014   Sulfonamide derivatives Itching and Rash     Family History  Problem Relation Age of Onset   Heart disease Mother    Hypertension Mother    Breast cancer Sister    Diabetes Brother    Colon cancer Neg Hx    Esophageal cancer Neg Hx    Rectal cancer Neg Hx    Stomach cancer Neg Hx    Colon polyps Neg Hx     Social History   Socioeconomic History   Marital status: Widowed    Spouse name: Not on file   Number of children: 3   Years of education: 14   Highest education level: Not on file  Occupational History   Occupation: retired    Associate Professor: UNEMPLOYED  Tobacco Use   Smoking status: Never    Passive exposure: Never   Smokeless tobacco: Never  Vaping Use   Vaping status: Never Used  Substance and Sexual Activity   Alcohol use: No    Alcohol/week: 0.0 standard drinks of alcohol   Drug use: No   Sexual activity: Not Currently    Birth control/protection: Post-menopausal  Other Topics Concern   Not on file  Social History Narrative   Not on file   Social Determinants of Health   Financial Resource Strain: Low Risk  (10/23/2022)   Overall Financial Resource Strain (CARDIA)    Difficulty of Paying Living Expenses: Not hard at all  Food Insecurity: No Food Insecurity (10/23/2022)   Hunger Vital Sign    Worried About Running Out of Food in the Last Year: Never true    Ran Out of Food in the Last Year: Never true  Transportation Needs: No Transportation Needs (10/23/2022)   PRAPARE - Administrator, Civil Service (Medical): No    Lack of Transportation (Non-Medical): No  Physical Activity: Inactive (10/23/2022)   Exercise Vital Sign    Days of Exercise per Week: 0 days    Minutes of Exercise per Session: 0 min  Stress: No Stress Concern Present (10/23/2022)   [employer] of Occupational Health - Occupational Stress Questionnaire    Feeling of Stress : Not at all  Social Connections: Moderately Isolated (10/23/2022)   Social  Connection and Isolation Panel [NHANES]    Frequency of Communication with Friends and Family: More than three times a  week    Frequency of Social Gatherings with Friends and Family: Once a week    Attends Religious Services: Never    Database administrator or Organizations: No    Attends Engineer, structural: 1 to 4 times per year    Marital Status: Widowed  Intimate Partner Violence: Not At Risk (10/23/2022)   Humiliation, Afraid, Rape, and Kick questionnaire    Fear of Current or Ex-Partner: No    Emotionally Abused: No    Physically Abused: No    Sexually Abused: No    Review of Systems:    Constitutional: No weight loss, fever, chills, weakness or fatigue HEENT: Eyes: No change in vision               Ears, Nose, Throat:  No change in hearing or congestion Skin: No rash or itching Cardiovascular: No chest pain, chest pressure or palpitations   Respiratory: No SOB or cough Gastrointestinal: See HPI and otherwise negative Genitourinary: No dysuria or change in urinary frequency Neurological: No headache, dizziness or syncope Musculoskeletal: No new muscle or joint pain Hematologic: No bleeding or bruising Psychiatric: No history of depression or anxiety    Physical Exam:  Vital signs: BP 110/70   Pulse 73   Ht 4\' 11"  (1.499 m)   Wt 228 lb (103.4 kg)   BMI 46.05 kg/m   Constitutional: NAD, Well developed, Well nourished, alert and cooperative Head:  Normocephalic and atraumatic. Eyes:   PEERL, EOMI. No icterus. Conjunctiva pink. Respiratory: Respirations even and unlabored. Lungs clear to auscultation bilaterally.   No wheezes, crackles, or rhonchi.  Cardiovascular:  Regular rate and rhythm. No peripheral edema, cyanosis or pallor.  Gastrointestinal:  Soft, nondistended, nontender. No rebound or guarding.  Hypoactive bowel sounds. No appreciable masses or hepatomegaly. Rectal:  Not performed.  Msk:  Symmetrical without gross deformities. Without edema, no  deformity or joint abnormality.  Neurologic:  Alert and  oriented x4;  grossly normal neurologically.  Skin:   Dry and intact without significant lesions or rashes. Psychiatric: Oriented to person, place and time. Demonstrates good judgement and reason without abnormal affect or behaviors.   RELEVANT LABS AND IMAGING: CBC    Component Value Date/Time   WBC 8.0 07/05/2023 1141   RBC 4.17 07/05/2023 1141   HGB 12.0 07/05/2023 1141   HCT 37.2 07/05/2023 1141   PLT 314.0 07/05/2023 1141   MCV 89.1 07/05/2023 1141   MCH 25.2 (L) 09/20/2020 1614   MCHC 32.2 07/05/2023 1141   RDW 14.1 07/05/2023 1141   LYMPHSABS 2.0 07/05/2023 1141   MONOABS 0.6 07/05/2023 1141   EOSABS 0.5 07/05/2023 1141   BASOSABS 0.1 07/05/2023 1141    CMP     Component Value Date/Time   NA 141 07/05/2023 1141   K 4.6 07/05/2023 1141   CL 102 07/05/2023 1141   CO2 30 07/05/2023 1141   GLUCOSE 108 (H) 07/05/2023 1141   BUN 33 (H) 07/05/2023 1141   CREATININE 1.19 07/05/2023 1141   CREATININE 1.04 (H) 09/20/2020 1614   CALCIUM 9.8 07/05/2023 1141   PROT 7.5 07/05/2023 1141   ALBUMIN 4.0 07/05/2023 1141   AST 21 07/05/2023 1141   ALT 18 07/05/2023 1141   ALKPHOS 116 07/05/2023 1141   BILITOT 0.4 07/05/2023 1141   GFRNONAA >60 08/22/2020 0035   GFRAA >60 12/18/2019 1540     Assessment/Plan:      Dysphagia Difficulty swallowing solids, likely due to dental issues. Improvement noted after esophageal  dilation during endoscopy. Currently on Pantoprazole. -- Continue Pantoprazole - Encouraged patient to get dental issue resolved and recommended consuming soft foods and avoiding large meats  Chronic Constipation Infrequent bowel movements (1-2 per week), occasional use of Miralax. Nausea and vomiting associated with constipation. --Start daily Miralax (1 capful) and adjust dose based on response. --Stressed importance of consistency with MiraLAX - Patient to follow-up as needed per request, recommend to  make an appointment if persistent constipation.  History of tubular adenomas Small polyp found during colonoscopy in 2022. No further colonoscopies needed due to age and risk/benefit considerations unless symptoms change. -Continue current management and monitor for changes in bowel habits.       Lara Mulch  Gastroenterology 09/15/2023, 9:51 AM  Cc: Corwin Levins, MD

## 2023-09-15 NOTE — Patient Instructions (Addendum)
Start taking Miralax 1 capful (17 grams) 1x / day for 1 week.   If this is not effective, increase to 1 dose 2x / day for 1 week.   If this is still not effective, increase to two capfuls (34 grams) 2x / day.   Can adjust dose as needed based on response. Can take 1/2 cap daily, skip days, or increase per day.    Follow up as needed  ,_______________________________________________________  If your blood pressure at your visit was 140/90 or greater, please contact your primary care physician to follow up on this.  _______________________________________________________  If you are age 11 or older, your body mass index should be between 23-30. Your Body mass index is 46.05 kg/m. If this is out of the aforementioned range listed, please consider follow up with your Primary Care Provider.  If you are age 59 or younger, your body mass index should be between 19-25. Your Body mass index is 46.05 kg/m. If this is out of the aformentioned range listed, please consider follow up with your Primary Care Provider.   ________________________________________________________  The [facility] providers would like to encourage you to use [facility] to communicate with providers for non-urgent requests or questions.  Due to long hold times on the telephone, sending your provider a message by [facility] may be a faster and more efficient way to get a response.  Please allow 48 business hours for a response.  Please remember that this is for non-urgent requests.  _______________________________________________________   Thank you for entrusting me with your care and choosing [facility].  Bayley Leanna Sato, PA-C

## 2023-09-20 DIAGNOSIS — M48062 Spinal stenosis, lumbar region with neurogenic claudication: Secondary | ICD-10-CM | POA: Diagnosis not present

## 2023-09-20 DIAGNOSIS — F1120 Opioid dependence, uncomplicated: Secondary | ICD-10-CM | POA: Diagnosis not present

## 2023-09-22 NOTE — Therapy (Signed)
OUTPATIENT PHYSICAL THERAPY PROGRESS NOTE + RECERTIFICATION   Patient Name: [name] MRN: [id number] DOB:Dec 12, 1945, 77 y.o., female Today's Date: 09/23/2023   Progress Note Reporting Period 07/29/23 to 09/23/23  See note below for Objective Data and Assessment of Progress/Goals.      END OF SESSION:  PT End of Session - 09/23/23 1359     Visit Number 4    Number of Visits 9    Date for PT Re-Evaluation 10/21/23    Authorization Type [employer]    PT Start Time 1400    PT Stop Time 1440    PT Time Calculation (min) 40 min    Activity Tolerance Patient tolerated treatment well    Behavior During Therapy WFL for tasks assessed/performed                Past Medical History:  Diagnosis Date   Allergic rhinitis, cause unspecified    Allergy    SEASONAL   Anemia    Cataract    Bilateral   CHF (congestive heart failure) (HCC)    Chronic headaches    Constipation    Diverticulosis    DJD (degenerative joint disease), lumbar    GERD (gastroesophageal reflux disease)    Hemorrhoids    Hiatal hernia    HTN (hypertension)    Hyperlipidemia    Obesity    Osteoarthritis    Tubular adenoma of colon    Past Surgical History:  Procedure Laterality Date   ABLATION     uterus   COLONOSCOPY  05/15/2021   Pyrtle   TOTAL KNEE ARTHROPLASTY Right 04/14/2019   Procedure: TOTAL KNEE ARTHROPLASTY;  Surgeon: Jodi Geralds, MD;  Location: [facility];  Service: Orthopedics;  Laterality: Right;   TUBAL LIGATION     Patient Active Problem List   Diagnosis Date Noted   Generalized weakness 07/08/2023   Diabetes (HCC) 07/05/2023   HLD (hyperlipidemia) 07/05/2023   Cerumen impaction 12/21/2022   Conductive hearing loss, bilateral 12/21/2022   Cough productive of yellow sputum 08/18/2022   LRTI (lower respiratory tract infection) 08/18/2022   Allergic rhinitis 01/24/2022   COVID-19 virus infection 11/23/2021   CKD (chronic kidney disease) stage 3, GFR 30-59 ml/min (HCC)  07/14/2021   Vitamin D deficiency 07/14/2021   Pain and swelling of lower leg, left 05/15/2021   Cellulitis of lower leg 01/14/2021   Sepsis due to cellulitis (HCC) 08/13/2020   Cellulitis of right lower extremity 08/13/2020   Injury of back due to fall 08/13/2020   Microcytic anemia 08/13/2020   Lactic acidosis 08/13/2020   Sepsis (HCC) 08/13/2020   Aortic atherosclerosis (HCC) 08/07/2020   Itching 08/07/2020   Leg skin lesion, left 08/07/2020   Cellulitis of left leg 01/11/2020   Pulmonary embolism (HCC) 12/28/2019   Leg wound, left 11/12/2019   Insomnia 09/23/2019   Peripheral edema 07/20/2019   Primary osteoarthritis of right knee 04/14/2019   Acute bursitis of right shoulder 08/16/2017   Abnormal CXR 07/26/2017   Unexplained night sweats 07/26/2017   Greater trochanteric bursitis of right hip 06/23/2017   Sweating abnormality 03/09/2017   Gingivitis 03/09/2017   Herpes zoster without complication 02/10/2017   Trapezoid ligament sprain, right, initial encounter 12/11/2016   Fasciculation 12/11/2016   Acute bronchitis 11/16/2016   Degenerative joint disease of left shoulder 10/07/2016   Degenerative arthritis of knee, bilateral 09/16/2016   Lumbar stenosis with neurogenic claudication 06/17/2016   Compression fracture of L1 lumbar vertebra (HCC) 03/31/2016   Pelvic mass in  female 03/31/2016   Urinary frequency 02/27/2016   Chronic maxillary sinusitis 06/06/2015   Carpal tunnel syndrome 09/18/2014   Sprain of ankle 07/04/2014   Primary localized osteoarthrosis, lower leg 05/21/2014   Left knee pain 03/15/2014   Food allergy 10/05/2013   Eustachian tube dysfunction, left 04/24/2013   Degenerative arthritis of left knee 04/24/2013   Hot flashes 04/24/2013   External otitis of left ear 12/15/2012   Spinal stenosis of lumbar region 04/25/2012   Seasonal and perennial allergic rhinitis 03/18/2012   Hearing loss in right ear 03/18/2012   Esophageal dysphagia 02/11/2012    Dysphagia 01/20/2012   Rash 01/20/2012   Bilateral knee pain 01/20/2012   Encounter for well adult exam with abnormal findings 01/14/2012   GERD (gastroesophageal reflux disease) 01/14/2012   Essential (primary) hypertension 01/14/2012   Mixed hyperlipidemia 01/14/2012   Body mass index (BMI) 39.0-39.9, adult 01/14/2012   Osteoarthritis 01/14/2012   DJD (degenerative joint disease), lumbar 01/14/2012   Palpitations 11/21/2010   Dyspnea on exertion 11/21/2010    PCP: Corwin Levins, MD  REFERRING PROVIDER: Corwin Levins, MD  REFERRING DIAG: R53.1 (ICD-10-CM) - Generalized weakness  Rationale for Evaluation and Treatment: Rehabilitation  THERAPY DIAG:  Muscle weakness (generalized)  Other abnormalities of gait and mobility  ONSET DATE: about 2020  SUBJECTIVE:                                                                                                                                                                                          Per eval - Pt states she has felt a bit weaker in her legs ever since her knee replacement, not really worsening but not improving. Denies any cardiac symptoms (chest pain or SOB) but endorses muscular fatigue. Does endorse reduced activity levels as time goes on. Pt enjoys going to church and shopping, working around the house. Difficulty with vacuuming, walking, sweeping.  Reports occasional dizziness that is chronic, seems to be mostly when stooping over (encouraged her to discuss this further with her provider). Also sees a doctor for her chronic back pain.   SUBJECTIVE STATEMENT: 09/23/2023 Pt arrives after gap in care, reports being under the weather. More knee pain with change in weather, 6/10 today. Does feel stronger since starting PT, would like to do another session or two.     PERTINENT HISTORY:  HTN, PE, aortic atherosclerosis, DM, DOE, palpitations, CHF, headaches  PAIN:  Reports some chronic knee pain BIL, L>R, states fatigue  is more limiting.   PRECAUTIONS: Fall  WEIGHT BEARING RESTRICTIONS: No  FALLS:  Has patient fallen in last 6 months? No - does endorse  remote history of falling  LIVING ENVIRONMENT: 1 story house, ramp entrance in back, 2STE from front Lives w/ her son who works full time Pt does housework and laundry Pt does not drive, has transportation with neighbor or [employer] Has been using rollator since the beginning of this year, has been using walker since TKA in 2020, previously using cane  OCCUPATION: retired - used to work in Teaching laboratory technician   PLOF: Independent with community mobility with device  PATIENT GOALS: be more active  NEXT MD VISIT: a couple months per pt report  OBJECTIVE:  Note: Objective measures were completed at Evaluation unless otherwise noted.  DIAGNOSTIC FINDINGS:  No recent imaging in chart  PATIENT SURVEYS:  FOTO 16 current, 45 predicted 09/23/23 FOTO 56   COGNITION: Overall cognitive status: Within functional limits for tasks assessed     SENSATION: Reports chronic numbness/tingling in R knee which she states is stable since knee replacement  POSTURE: forward flexed trunk, forward head  PALPATION: Not indicated   RANGE OF MOTION:     Active  Right eval Left eval  Shoulder flexion    Functional ER combo    Functional IR combo    Knee extension    Ankle dorsiflexion     (Blank rows = not tested) (Key: WFL = within functional limits not formally assessed, * = concordant pain, s = stiffness/stretching sensation, NT = not tested)  Comments:    STRENGTH TESTING:  MMT Right eval Left eval R/L 09/23/23  Shoulder flexion     Shoulder abduction     Elbow flexion     Elbow extension     Grip strength (dynamometer)     Hip flexion 4 4- 4/4  Hip abduction (modified sitting) 4+ 4+ 4+/4+  Knee flexion 4 4 5/5  Knee extension 4- 4 4+/4+  Ankle dorsiflexion 4+ 4+ 4+/4+  Ankle plantarflexion      (Blank rows = not tested) (Key: WFL = within functional  limits not formally assessed, * = concordant pain, s = stiffness/stretching sensation, NT = not tested)  Comments: all MMT nonpainful  FUNCTIONAL TESTS:  30secSTS: 4 repetitions w/ UE support from chair  TUG: 24sec w/ rollator   08/26/23 TUG: 21 sec w/ rollator; 19sec w/ rollator (just shy of cones), 18sec all the way to cones w rollator  09/23/23: - TUG 24 sec w/ rollator first trial, 18 sec seocnd trial w/ rollator - 30secSTS 7 sec w UE support   GAIT: Distance walked: within clinic  Assistive device utilized: Walker - 4 wheeled Level of assistance: Modified independence Comments: reduced gait speed/cadence, forward flexed posture, reduced hip extension and clearance BIL  TODAY'S TREATMENT:                                                                                                                              [facility] Adult PT Treatment:  DATE: 09/23/23 Therapeutic Exercise: Standing march w/ rollator x12 BIL cues for posture 4 inch step up LLE at treadmill for UE support x6, quad avoidance noted, improves w/ reps Standing heel raises x10 w UE support  Therapeutic Activity: MSK assessment + education FOTO + education Education/discussion re: progress with PT, symptom behavior as it affects activity tolerance, PT goals/POC  TUG + education 30sec STS + education     PATIENT EDUCATION:  Education details: rationale for interventions, HEP  Person educated: Patient Education method: Explanation, Demonstration, Tactile cues, Verbal cues Education comprehension: verbalized understanding, returned demonstration, verbal cues required, tactile cues required, and needs further education    HOME EXERCISE PROGRAM: Access Code: [email or web address] URL: [email or web address] Date: 08/26/2023 Prepared by: Fransisco Hertz  Exercises - Sit to Stand with Armchair  - 2-3 x daily - 7 x weekly - 1 sets - 8 reps - Seated Long Arc Quad  - 2-3 x  daily - 7 x weekly - 1 sets - 10 reps - Standing March with Counter Support  - 2-3 x daily - 7 x weekly - 1 sets - 8 reps - Heel Raises with Counter Support  - 2-3 x daily - 7 x weekly - 1 sets - 10 reps  ASSESSMENT:  CLINICAL IMPRESSION: 09/23/2023 Pt arrives w/ knee pain after gap in care due to being under the weather per pt report, she does endorse progress since start of PT, feels she is moving around better. Objectively she has made improvement with TUG time although remains variable over multiple trials. Focal LE strength improved w/ MMT. In discussion w/ pt she would like to do a couple more sessions of PT to continue working on strength - updated POC as below to accommodate remaining visits in initial POC. See goals progress below. Tolerates remainder of session well with no adverse events, reports improvement in knee pain. Recommend continuing along updated POC in order to address relevant deficits and improve functional tolerance. Pt departs today's session in no acute distress, all voiced questions/concerns addressed appropriately from PT perspective.     Per eval - Patient is a pleasant 77 y.o. woman who was seen today for physical therapy evaluation and treatment for generalized weakness ongoing over past few years. She endorses difficulty entering home via steps and is using ramp, endorses reduced activity levels due to fatigue/weakness. On exam, she demonstrates mild weakness BIL (R more affected than L which she attributes to prior TKA), altered mechanics with gait/transfers. Reduced activity tolerance and fall risk both evidenced by 30sec STS test and TUG. No adverse events, pt tolerates activities well in clinic, does well with HEP practice. Recommend trial of skilled PT to address aforementioned deficits with aim of improving functional tolerance and reducing pain with typical activities. Pt departs today's session in no acute distress, all voiced concerns/questions addressed  appropriately from PT perspective.     OBJECTIVE IMPAIRMENTS: Abnormal gait, decreased activity tolerance, decreased balance, decreased endurance, decreased mobility, difficulty walking, decreased strength, improper body mechanics, postural dysfunction, and pain.   ACTIVITY LIMITATIONS: carrying, lifting, bending, standing, stairs, transfers, and locomotion level  PARTICIPATION LIMITATIONS: meal prep, cleaning, laundry, shopping, and community activity  PERSONAL FACTORS: Age, Time since onset of injury/illness/exacerbation, and 3+ comorbidities: HTN, hx PE, DM, CHF, headaches  are also affecting patient's functional outcome.   REHAB POTENTIAL: Fair given chronicity and comorbidities  CLINICAL DECISION MAKING: Stable/uncomplicated  EVALUATION COMPLEXITY: Low   GOALS: Goals reviewed with patient? Yes  SHORT TERM GOALS: Target date: 08/26/2023  Pt will demonstrate appropriate understanding and performance of initially prescribed HEP in order to facilitate improved independence with management of symptoms.  Baseline: reports daily adherence Goal status: MET  2. Pt will score greater than or equal to 30 on FOTO in order to demonstrate improved perception of function due to symptoms.  Baseline: 16  08/26/23: deferred given visit 3  09/23/23: 56   Goal status: MET  LONG TERM GOALS: Target date: 10/21/2023  (Updated 09/23/23) Pt will score 45 on FOTO in order to demonstrate improved perception of functional status due to symptoms.  Baseline: 16 09/23/23: 56  Goal status: MET   2. Pt will be able to perform at least 8 repetitions during 30 second sit to stand test in order to demonstrate improved exercise/activity tolerance (cutoff score for low exercise tolerance 18 repetitions in males and 16 repetitions in females per Georgina Snell al 2024)  Baseline: 4 repetitions, with UE support 09/23/23: 7 sec w UE support  Goal status: NEARLY MET     3.  Pt will be able to perform TUG in less than or  equal to 18 sec in order to indicate reduced risk of falling (cutoff score for fall risk 13.5 sec in community dwelling older adults per [facility] et al, 2000)  Baseline: 24 sec with rollator 09/23/23: best rep 18sec w rollator  Goal status: MET     4. Pt will be able to safely navigate up to 2 stairs w/ UE support in order to facilitate improved home access.  Baseline: avoiding stairs, using ramp per pt report 09/23/23: still using ramp per pt report  Goal status: ONGOING   5. Pt will demonstrate grossly symmetrical LE MMT of at least 4+/5 throughout to indicate improved functional strength.  Baseline: see MMT chart above 09/23/23: 4+/5 or greater throughout   Goal status: MET   PLAN: UPDATED 09/23/23  PT FREQUENCY: 1x/week    PT DURATION: 4 weeks  PLANNED INTERVENTIONS: Patient/Family education, Balance training, Stair training, Cryotherapy, Moist heat, Therapeutic exercises, Therapeutic activity, Neuromuscular re-education, Gait training, and Self Care.  PLAN FOR NEXT SESSION: update HEP. Continue working on general strengthening, stair navigation (emphasis on appropriate compensations, strategies to maximize efficiency). Probable discharge in next visit or two if continues along current trajectory per discussion w/ pt on 12/5   Ashley Murrain PT, DPT 09/23/2023 2:43 PM

## 2023-09-22 NOTE — Progress Notes (Signed)
Addendum: Reviewed and agree with assessment and management plan. Kadijah Shamoon M, MD  

## 2023-09-23 ENCOUNTER — Ambulatory Visit: Payer: Medicare Other | Attending: Internal Medicine | Admitting: Physical Therapy

## 2023-09-23 ENCOUNTER — Encounter: Payer: Self-pay | Admitting: Physical Therapy

## 2023-09-23 DIAGNOSIS — M6281 Muscle weakness (generalized): Secondary | ICD-10-CM | POA: Diagnosis not present

## 2023-09-23 DIAGNOSIS — R2689 Other abnormalities of gait and mobility: Secondary | ICD-10-CM | POA: Diagnosis not present

## 2023-10-05 ENCOUNTER — Encounter: Payer: Self-pay | Admitting: Internal Medicine

## 2023-10-05 ENCOUNTER — Ambulatory Visit (INDEPENDENT_AMBULATORY_CARE_PROVIDER_SITE_OTHER): Payer: Medicare Other | Admitting: Internal Medicine

## 2023-10-05 VITALS — BP 134/72 | HR 65 | Temp 97.8°F | Ht 59.0 in | Wt 228.0 lb

## 2023-10-05 DIAGNOSIS — H60502 Unspecified acute noninfective otitis externa, left ear: Secondary | ICD-10-CM | POA: Diagnosis not present

## 2023-10-05 DIAGNOSIS — H6122 Impacted cerumen, left ear: Secondary | ICD-10-CM | POA: Diagnosis not present

## 2023-10-05 DIAGNOSIS — E1165 Type 2 diabetes mellitus with hyperglycemia: Secondary | ICD-10-CM

## 2023-10-05 DIAGNOSIS — E7800 Pure hypercholesterolemia, unspecified: Secondary | ICD-10-CM

## 2023-10-05 DIAGNOSIS — H6092 Unspecified otitis externa, left ear: Secondary | ICD-10-CM | POA: Insufficient documentation

## 2023-10-05 MED ORDER — CIPROFLOXACIN HCL 500 MG PO TABS: 500.0000 mg | ORAL_TABLET | Freq: Two times a day (BID) | ORAL | 0 refills | Status: AC

## 2023-10-05 NOTE — Assessment & Plan Note (Signed)
After antibx, ok for pt to use otc debrox and warm water irrigation

## 2023-10-05 NOTE — Assessment & Plan Note (Signed)
Lab Results  Component Value Date   HGBA1C 6.5 07/05/2023   Stable, pt to continue current medical treatment  - diet, wt control

## 2023-10-05 NOTE — Progress Notes (Signed)
Patient ID: [name], female   DOB: [birth year], 77 y.o.   MRN: [id number]        Chief Complaint: follow up left ear pain, left cerumen impaction, dm, hld       HPI:  [name] is a 77 y.o. female here after reduced hearing with left cerumen impaction, unable to clear herself, and now pain red sweling of the canal after tried to remove with scraping with a fingernail, but no fever.  Has had mild left discharge.  Pt denies chest pain, increased sob or doe, wheezing, orthopnea, PND, increased LE swelling, palpitations, dizziness or syncope.   Pt denies polydipsia, polyuria, or new focal neuro s/s.    Pt denies fever, wt loss, night sweats, loss of appetite, or other constitutional symptoms      Wt Readings from Last 3 Encounters:  10/05/23 228 lb (103.4 kg)  09/15/23 228 lb (103.4 kg)  07/05/23 235 lb (106.6 kg)   BP Readings from Last 3 Encounters:  10/05/23 134/72  09/15/23 110/70  07/05/23 138/72         Past Medical History:  Diagnosis Date   Allergic rhinitis, cause unspecified    Allergy    SEASONAL   Anemia    Cataract    Bilateral   CHF (congestive heart failure) (HCC)    Chronic headaches    Constipation    Diverticulosis    DJD (degenerative joint disease), lumbar    GERD (gastroesophageal reflux disease)    Hemorrhoids    Hiatal hernia    HTN (hypertension)    Hyperlipidemia    Obesity    Osteoarthritis    Tubular adenoma of colon    Past Surgical History:  Procedure Laterality Date   ABLATION     uterus   COLONOSCOPY  05/15/2021   Pyrtle   TOTAL KNEE ARTHROPLASTY Right 04/14/2019   Procedure: TOTAL KNEE ARTHROPLASTY;  Surgeon: Jodi Geralds, MD;  Location: [facility];  Service: Orthopedics;  Laterality: Right;   TUBAL LIGATION      reports that she has never smoked. She has never been exposed to tobacco smoke. She has never used smokeless tobacco. She reports that she does not drink alcohol and does not use drugs. family history includes  Breast cancer in her sister; Diabetes in her brother; Heart disease in her mother; Hypertension in her mother. Allergies  Allergen Reactions   Doxycycline Itching    Rash, itching   Oxycodone    Soybean-Containing Drug Products Other (See Comments)    Allergy testing positive for soy beans but pt uses soy sauce   Sulfonamide Derivatives Itching and Rash   Current Outpatient Medications on File Prior to Visit  Medication Sig Dispense Refill   aspirin-acetaminophen-caffeine (EXCEDRIN MIGRAINE) 250-250-65 MG tablet Take 1 tablet by mouth every 6 (six) hours as needed for headache.     atorvastatin (LIPITOR) 40 MG tablet Take 1 tablet (40 mg total) by mouth daily. 90 tablet 3   azelastine (ASTELIN) 0.1 % nasal spray Place 1 spray into both nostrils 2 (two) times daily. Use in each nostril as directed 90 mL 4   azelastine (OPTIVAR) 0.05 % ophthalmic solution Place 1 drop into both eyes 2 (two) times daily. 6 mL 12   betamethasone, augmented, (DIPROLENE) 0.05 % lotion Apply topically daily as needed.     cetirizine (ZYRTEC) 10 MG tablet Take 1 tablet (10 mg total) by mouth daily. As needed (Patient taking differently: Take 10 mg by mouth daily  as needed for allergies. As needed) 90 tablet 3   Cholecalciferol 50 MCG (2000 UT) TABS 1 tab by mouth once daily 30 tablet 99   diltiazem (DILACOR XR) 240 MG 24 hr capsule Take 1 capsule (240 mg total) by mouth daily. 90 capsule 3   DULoxetine (CYMBALTA) 20 MG capsule TAKE 2 CAPSULES EVERY DAY 180 capsule 3   ferrous sulfate 325 (65 FE) MG tablet Take 1 tablet (325 mg total) by mouth 2 (two) times daily with a meal.  3   furosemide (LASIX) 80 MG tablet Take 1 tablet (80 mg total) by mouth 2 (two) times daily. 180 tablet 3   gabapentin (NEURONTIN) 100 MG capsule Take 2 capsules (200 mg total) by mouth at bedtime. 180 capsule 1   HYDROcodone-acetaminophen (NORCO/VICODIN) 5-325 MG tablet Take 0.5 tablets by mouth daily as needed.     hydrOXYzine (ATARAX) 10 MG  tablet Take 1 tablet (10 mg total) by mouth at bedtime as needed for itching. 60 tablet 2   losartan (COZAAR) 100 MG tablet Take 1 tablet (100 mg total) by mouth daily. 90 tablet 3   metoprolol tartrate (LOPRESSOR) 50 MG tablet Take 1 tablet (50 mg total) by mouth 2 (two) times daily. 180 tablet 3   ondansetron (ZOFRAN) 4 MG tablet Take 1 tablet (4 mg total) by mouth every 8 (eight) hours as needed for nausea or vomiting. 40 tablet 1   pantoprazole (PROTONIX) 40 MG tablet Take 1 tablet (40 mg total) by mouth daily. 90 tablet 3   pantoprazole (PROTONIX) 40 MG tablet TAKE 1 TABLET(40 MG) BY MOUTH DAILY 90 tablet 0   Polyethyl Glycol-Propyl Glycol (SYSTANE OP) Place 1 drop into both eyes 3 (three) times daily as needed (dry eyes).     Polyethylene Glycol 3350 (MIRALAX PO) Take 17 g by mouth daily as needed (constipation).      potassium chloride (KLOR-CON M) 10 MEQ tablet Take 1 tablet (10 mEq total) by mouth daily. 90 tablet 3   promethazine-dextromethorphan (PROMETHAZINE-DM) 6.25-15 MG/5ML syrup Take 5 mLs by mouth 4 (four) times daily as needed for cough. 118 mL 0   tiZANidine (ZANAFLEX) 2 MG tablet TAKE 1 TABLET EVERY 6 HOURS AS NEEDED FOR MUSCLE SPASMS 60 tablet 3   triamcinolone (NASACORT) 55 MCG/ACT AERO nasal inhaler Place 2 sprays into the nose daily. 1 each 12   triamcinolone cream (KENALOG) 0.1 % Apply 1 application topically 2 (two) times daily. 453 g 0   vitamin B-12 (CYANOCOBALAMIN) 1000 MCG tablet Take 1,000 mcg by mouth daily.     No current facility-administered medications on file prior to visit.        ROS:  All others reviewed and negative.  Objective        PE:  BP 134/72 (BP Location: Left Arm, Patient Position: Sitting, Cuff Size: Normal)   Pulse 65   Temp 97.8 F (36.6 C) (Oral)   Ht 4\' 11"  (1.499 m)   Wt 228 lb (103.4 kg)   SpO2 98%   BMI 46.05 kg/m                 Constitutional: Pt appears in NAD               HENT: Head: NCAT.                Right Ear:  External ear normal.                 Left Ear: External  ear normal. Left cerumen impaction, but also left ext canal with 1+ red, tender, swelling near abrasion of canal entrance               Eyes: . Pupils are equal, round, and reactive to light. Conjunctivae and EOM are normal               Nose: without d/c or deformity               Neck: Neck supple. Gross normal ROM               Cardiovascular: Normal rate and regular rhythm.                 Pulmonary/Chest: Effort normal and breath sounds without rales or wheezing.                               Neurological: Pt is alert. At baseline orientation, motor grossly intact               Skin: Skin is warm. No rashes, no other new lesions, LE edema - none               Psychiatric: Pt behavior is normal without agitation   Micro: none  Cardiac tracings I have personally interpreted today:  none  Pertinent Radiological findings (summarize): none   Lab Results  Component Value Date   WBC 8.0 07/05/2023   HGB 12.0 07/05/2023   HCT 37.2 07/05/2023   PLT 314.0 07/05/2023   GLUCOSE 108 (H) 07/05/2023   CHOL 176 07/05/2023   TRIG 159.0 (H) 07/05/2023   HDL 54.50 07/05/2023   LDLDIRECT 93.0 01/14/2021   LDLCALC 89 07/05/2023   ALT 18 07/05/2023   AST 21 07/05/2023   NA 141 07/05/2023   K 4.6 07/05/2023   CL 102 07/05/2023   CREATININE 1.19 07/05/2023   BUN 33 (H) 07/05/2023   CO2 30 07/05/2023   TSH 0.69 07/05/2023   INR 1.2 08/13/2020   HGBA1C 6.5 07/05/2023   MICROALBUR 11.4 (H) 07/05/2023   Assessment/Plan:  Miray Abernathy is a 77 y.o. Black or African American [2] female with  has a past medical history of Allergic rhinitis, cause unspecified, Allergy, Anemia, Cataract, CHF (congestive heart failure) (HCC), Chronic headaches, Constipation, Diverticulosis, DJD (degenerative joint disease), lumbar, GERD (gastroesophageal reflux disease), Hemorrhoids, Hiatal hernia, HTN (hypertension), Hyperlipidemia, Obesity,  Osteoarthritis, and Tubular adenoma of colon.  Left otitis externa Mild to mod, for antibx course cipro 500 bid,  to f/u any worsening symptoms or concerns  HLD (hyperlipidemia) Lab Results  Component Value Date   LDLCALC 89 07/05/2023   Mild uncontrolled,, pt to continue current statin lipitor 40 mg, delcines other change today    Diabetes (HCC) Lab Results  Component Value Date   HGBA1C 6.5 07/05/2023   Stable, pt to continue current medical treatment  - diet, wt control   Cerumen impaction After antibx, ok for pt to use otc debrox and warm water irrigation  Followup: Return in about 3 months (around 01/03/2024).  Oliver Barre, MD 10/05/2023 2:54 PM [facility]

## 2023-10-05 NOTE — Assessment & Plan Note (Signed)
Lab Results  Component Value Date   LDLCALC 89 07/05/2023   Mild uncontrolled,, pt to continue current statin lipitor 40 mg, delcines other change today

## 2023-10-05 NOTE — Assessment & Plan Note (Signed)
Mild to mod, for antibx course cipro 500 bid,  to f/u any worsening symptoms or concerns

## 2023-10-05 NOTE — Patient Instructions (Addendum)
Please take all new medication as prescribed  - the pill antibiotic  In 1 wk, you should be able to flush the left ear with warm water after using the Debrox OTC for ear wax removal  Please continue all other medications as before, and refills have been done if requested.  Please have the pharmacy call with any other refills you may need.  Please continue your efforts at being more active, low cholesterol diet, and weight control.  Please keep your appointments with your specialists as you may have planned  Please make an Appointment to return in Jan 03 2024, or sooner if needed

## 2023-10-19 ENCOUNTER — Encounter: Payer: Self-pay | Admitting: Physical Therapy

## 2023-10-19 ENCOUNTER — Ambulatory Visit: Payer: Medicare Other | Admitting: Physical Therapy

## 2023-10-19 DIAGNOSIS — R2689 Other abnormalities of gait and mobility: Secondary | ICD-10-CM | POA: Diagnosis not present

## 2023-10-19 DIAGNOSIS — M6281 Muscle weakness (generalized): Secondary | ICD-10-CM

## 2023-10-19 NOTE — Therapy (Addendum)
  OUTPATIENT PHYSICAL THERAPY TREATMENT NOTE   Patient Name: [name] MRN: [id number] DOB:1946-10-10, 77 y.o., female Today's Date: 10/19/2023      END OF SESSION:  PT End of Session - 10/19/23 1442     Visit Number 5    Number of

## 2023-11-02 ENCOUNTER — Ambulatory Visit: Payer: Medicare Other | Admitting: Physical Therapy

## 2023-11-03 ENCOUNTER — Ambulatory Visit: Payer: Medicare Other

## 2023-11-03 VITALS — Ht 59.0 in | Wt 228.0 lb

## 2023-11-03 DIAGNOSIS — Z780 Asymptomatic menopausal state: Secondary | ICD-10-CM | POA: Diagnosis not present

## 2023-11-03 DIAGNOSIS — Z0000 Encounter for general adult medical examination without abnormal findings: Secondary | ICD-10-CM | POA: Diagnosis not present

## 2023-11-03 NOTE — Progress Notes (Signed)
 Subjective:   [name] is a 78 y.o. female who presents for Medicare Annual (Subsequent) preventive examination.  Visit Complete: Virtual I connected with  [name] on 11/03/23 by a audio enabled telemedicine application and verified that I am speaking with the correct person using two identifiers.  Patient Location: Home  Provider Location: Office/Clinic  I discussed the limitations of evaluation and management by telemedicine. The patient expressed understanding and agreed to proceed.  Vital Signs: Because this visit was a virtual/telehealth visit, some criteria may be missing or patient reported. Any vitals not documented were not able to be obtained and vitals that have been documented are patient reported.   Cardiac Risk Factors include: advanced age (>41men, >46 women);hypertension;diabetes mellitus;Other (see comment);dyslipidemia;obesity (BMI >30kg/m2), Risk factor comments: Aortic atherosclerosis, CKD     Objective:    Today's Vitals   11/03/23 0935  Weight: 228 lb (103.4 kg)  Height: 4\' 11"  (1.499 m)   Body mass index is 46.05 kg/m.     11/03/2023    9:41 AM 07/29/2023   11:04 AM 10/23/2022    8:48 AM 06/20/2022    3:44 PM 10/22/2021    8:44 AM 09/09/2020    9:06 AM 03/26/2020    1:36 PM  Advanced Directives  Does Patient Have a Medical Advance Directive? Yes No No No No No No  Type of Estate agent of [place];Living will        Copy of Healthcare Power of Attorney in Chart? No - copy requested        Would patient like information on creating a medical advance directive?  Yes (MAU/Ambulatory/Procedural Areas - Information given) No - Patient declined No - Patient declined No - Patient declined No - Patient declined No - Patient declined    Current Medications (verified) Outpatient Encounter Medications as of 11/03/2023  Medication Sig   aspirin -acetaminophen -caffeine  (EXCEDRIN  MIGRAINE) 250-250-65 MG tablet Take 1 tablet  by mouth every 6 (six) hours as needed for headache.   atorvastatin  (LIPITOR) 40 MG tablet Take 1 tablet (40 mg total) by mouth daily.   azelastine  (ASTELIN ) 0.1 % nasal spray Place 1 spray into both nostrils 2 (two) times daily. Use in each nostril as directed   azelastine  (OPTIVAR ) 0.05 % ophthalmic solution Place 1 drop into both eyes 2 (two) times daily.   betamethasone , augmented, (DIPROLENE ) 0.05 % lotion Apply topically daily as needed.   cetirizine  (ZYRTEC ) 10 MG tablet Take 1 tablet (10 mg total) by mouth daily. As needed (Patient taking differently: Take 10 mg by mouth daily as needed for allergies. As needed)   Cholecalciferol  50 MCG (2000 UT) TABS 1 tab by mouth once daily   diltiazem  (DILACOR XR ) 240 MG 24 hr capsule Take 1 capsule (240 mg total) by mouth daily.   DULoxetine  (CYMBALTA ) 20 MG capsule TAKE 2 CAPSULES EVERY DAY   ferrous sulfate  325 (65 FE) MG tablet Take 1 tablet (325 mg total) by mouth 2 (two) times daily with a meal.   furosemide  (LASIX ) 80 MG tablet Take 1 tablet (80 mg total) by mouth 2 (two) times daily.   gabapentin  (NEURONTIN ) 100 MG capsule Take 2 capsules (200 mg total) by mouth at bedtime.   HYDROcodone -acetaminophen  (NORCO/VICODIN) 5-325 MG tablet Take 0.5 tablets by mouth daily as needed.   hydrOXYzine  (ATARAX ) 10 MG tablet Take 1 tablet (10 mg total) by mouth at bedtime as needed for itching.   losartan  (COZAAR ) 100 MG tablet Take 1 tablet (  100 mg total) by mouth daily.   metoprolol  tartrate (LOPRESSOR ) 50 MG tablet Take 1 tablet (50 mg total) by mouth 2 (two) times daily.   ondansetron  (ZOFRAN ) 4 MG tablet Take 1 tablet (4 mg total) by mouth every 8 (eight) hours as needed for nausea or vomiting.   pantoprazole  (PROTONIX ) 40 MG tablet Take 1 tablet (40 mg total) by mouth daily.   pantoprazole  (PROTONIX ) 40 MG tablet TAKE 1 TABLET(40 MG) BY MOUTH DAILY   Polyethyl Glycol-Propyl Glycol (SYSTANE OP) Place 1 drop into both eyes 3 (three) times daily as needed  (dry eyes).   Polyethylene Glycol 3350  (MIRALAX  PO) Take 17 g by mouth daily as needed (constipation).    potassium chloride  (KLOR-CON  M) 10 MEQ tablet Take 1 tablet (10 mEq total) by mouth daily.   promethazine -dextromethorphan (PROMETHAZINE -DM) 6.25-15 MG/5ML syrup Take 5 mLs by mouth 4 (four) times daily as needed for cough.   tiZANidine  (ZANAFLEX ) 2 MG tablet TAKE 1 TABLET EVERY 6 HOURS AS NEEDED FOR MUSCLE SPASMS   triamcinolone  (NASACORT ) 55 MCG/ACT AERO nasal inhaler Place 2 sprays into the nose daily.   triamcinolone  cream (KENALOG ) 0.1 % Apply 1 application topically 2 (two) times daily.   vitamin B-12 (CYANOCOBALAMIN ) 1000 MCG tablet Take 1,000 mcg by mouth daily.   No facility-administered encounter medications on file as of 11/03/2023.    Allergies (verified) Doxycycline , Oxycodone , Soybean-containing drug products, and Sulfonamide derivatives   History: Past Medical History:  Diagnosis Date   Allergic rhinitis, cause unspecified    Allergy     SEASONAL   Anemia    Cataract    Bilateral   CHF (congestive heart failure) (HCC)    Chronic headaches    Constipation    Diverticulosis    DJD (degenerative joint disease), lumbar    GERD (gastroesophageal reflux disease)    Hemorrhoids    Hiatal hernia    HTN (hypertension)    Hyperlipidemia    Obesity    Osteoarthritis    Tubular adenoma of colon    Past Surgical History:  Procedure Laterality Date   ABLATION     uterus   COLONOSCOPY  05/15/2021   [place]   TOTAL KNEE ARTHROPLASTY Right 04/14/2019   Procedure: TOTAL KNEE ARTHROPLASTY;  Surgeon: Neil Balls, MD;  Location: [facility];  Service: Orthopedics;  Laterality: Right;   TUBAL LIGATION     Family History  Problem Relation Age of Onset   Heart disease Mother    Hypertension Mother    Breast cancer Sister    Diabetes Brother    Colon cancer Neg Hx    Esophageal cancer Neg Hx    Rectal cancer Neg Hx    Stomach cancer Neg Hx    Colon polyps Neg Hx     Social History   Socioeconomic History   Marital status: Widowed    Spouse name: Not on file   Number of children: 3   Years of education: 52   Highest education level: Not on file  Occupational History   Occupation: retired    Associate Professor: UNEMPLOYED  Tobacco Use   Smoking status: Never    Passive exposure: Never   Smokeless tobacco: Never  Vaping Use   Vaping status: Never Used  Substance and Sexual Activity   Alcohol  use: No    Alcohol /week: 0.0 standard drinks of alcohol    Drug use: No   Sexual activity: Not Currently    Birth control/protection: Post-menopausal  Other Topics Concern   Not on  file  Social History Narrative   Son lives with her-2025   Social Drivers of Health   Financial Resource Strain: Low Risk  (11/03/2023)   Overall Financial Resource Strain (CARDIA)    Difficulty of Paying Living Expenses: Not hard at all  Food Insecurity: No Food Insecurity (11/03/2023)   Hunger Vital Sign    Worried About Running Out of Food in the Last Year: Never true    Ran Out of Food in the Last Year: Never true  Transportation Needs: No Transportation Needs (10/23/2022)   PRAPARE - Administrator, Civil Service (Medical): No    Lack of Transportation (Non-Medical): No  Physical Activity: Insufficiently Active (11/03/2023)   Exercise Vital Sign    Days of Exercise per Week: 1 day    Minutes of Exercise per Session: 60 min  Stress: No Stress Concern Present (11/03/2023)   [employer] of Occupational Health - Occupational Stress Questionnaire    Feeling of Stress : Not at all  Social Connections: Moderately Integrated (11/03/2023)   Social Connection and Isolation Panel [NHANES]    Frequency of Communication with Friends and Family: More than three times a week    Frequency of Social Gatherings with Friends and Family: Never    Attends Religious Services: More than 4 times per year    Active Member of [employer] or Organizations: Yes    Attends Tax inspector Meetings: Never    Marital Status: Widowed    Tobacco Counseling Counseling given: Not Answered   Clinical Intake:  Pre-visit preparation completed: Yes  Pain : No/denies pain     BMI - recorded: 46.05 Nutritional Status: BMI > 30  Obese Nutritional Risks: None Diabetes: Yes CBG done?: No (does not check at home) Did pt. bring in CBG monitor from home?: No  How often do you need to have someone help you when you read instructions, pamphlets, or other written materials from your doctor or pharmacy?: 1 - Never  Interpreter Needed?: No  Information entered by :: Jolayne Branson, RMA   Activities of Daily Living    11/03/2023    9:38 AM  In your present state of health, do you have any difficulty performing the following activities:  Hearing? 1  Comment sometimes  Vision? 0  Difficulty concentrating or making decisions? 0  Walking or climbing stairs? 0  Dressing or bathing? 0  Doing errands, shopping? 1  Comment neighbor takes her  Quarry manager and eating ? N  Using the Toilet? N  In the past six months, have you accidently leaked urine? Y  Do you have problems with loss of bowel control? N  Managing your Medications? N  Managing your Finances? N  Housekeeping or managing your Housekeeping? N    Patient Care Team: Roslyn Coombe, MD as PCP - General (Internal Medicine) Szabat, Tino Foreman, [facility] (Inactive) as Pharmacist (Pharmacist) Devin Foerster, MD as Consulting Physician (Ophthalmology)  Indicate any recent Medical Services you may have received from other than [facility] providers in the past year (date may be approximate).     Assessment:   This is a routine wellness examination for [name].  Hearing/Vision screen Hearing Screening - Comments:: sometimes Vision Screening - Comments:: Denies vision issues.    Goals Addressed             This Visit's Progress    Client understands the importance of follow-up with providers by attending  scheduled visits.   On track  Depression Screen    11/03/2023    9:48 AM 10/05/2023    2:17 PM 07/05/2023   10:33 AM 11/16/2022   11:11 AM 11/16/2022   11:01 AM 10/23/2022    8:50 AM 05/15/2022    1:43 PM  PHQ 2/9 Scores  PHQ - 2 Score 1 0 0 1 0 0 0  PHQ- 9 Score 3    0      Fall Risk    11/03/2023    9:41 AM 10/05/2023    2:17 PM 07/05/2023   10:33 AM 11/16/2022   11:10 AM 11/16/2022   11:01 AM  Fall Risk   Falls in the past year? 0 0 0 1 1  Number falls in past yr: 0 0 0 1 0  Injury with Fall? 0 0 0 1 1  Comment    suffered wound to right leg now improved with wound clinic sept 2023   Risk for fall due to : No Fall Risks No Fall Risks No Fall Risks  Other (Comment)  Follow up Falls prevention discussed;Falls evaluation completed Falls evaluation completed Falls evaluation completed  Falls evaluation completed    MEDICARE RISK AT HOME: Medicare Risk at Home Any stairs in or around the home?: Yes (outside in front/ has a ramp in backyard) If so, are there any without handrails?: Yes Home free of loose throw rugs in walkways, pet beds, electrical cords, etc?: Yes Adequate lighting in your home to reduce risk of falls?: Yes Life alert?: Yes (can not find it) Use of a cane, walker or w/c?: Yes Grab bars in the bathroom?: Yes Shower chair or bench in shower?: Yes Elevated toilet seat or a handicapped toilet?: Yes  TIMED UP AND GO:  Was the test performed?  No    Cognitive Function:    05/24/2018    5:53 PM 02/16/2017    9:31 AM  MMSE - Mini Mental State Exam  Orientation to time 5 5  Orientation to Place 5 5  Registration 3 3  Attention/ Calculation 3 3  Recall 2 0  Language- name 2 objects 2 2  Language- repeat 1 1  Language- follow 3 step command 3 3  Language- read & follow direction 1 1  Write a sentence 1 1  Copy design 1 1  Total score 27 25        11/03/2023    9:43 AM 10/23/2022    8:50 AM  6CIT Screen  What Year? 0 points 0 points  What month? 0  points 0 points  What time? 0 points 0 points  Count back from 20 4 points 0 points  Months in reverse 4 points 0 points  Repeat phrase 8 points 0 points  Total Score 16 points 0 points    Immunizations Immunization History  Administered Date(s) Administered   Fluad Quad(high Dose 65+) 07/12/2020, 07/14/2021, 11/16/2022   Fluad Trivalent(High Dose 65+) 07/05/2023   Influenza Split 09/30/2012   Influenza, High Dose Seasonal PF 08/01/2013, 08/11/2016, 07/26/2017, 06/24/2018, 07/11/2019   Influenza,inj,Quad PF,6+ Mos 07/26/2015   Moderna Sars-Covid-2 Vaccination 11/25/2019, 12/18/2019, 08/05/2020   Pneumococcal Conjugate-13 03/15/2014   Pneumococcal Polysaccharide-23 01/30/2013   Tdap 01/18/1999, 04/13/2018, 06/20/2022    TDAP status: Up to date  Flu Vaccine status: Up to date  Pneumococcal vaccine status: Up to date  Covid-19 vaccine status: Declined, Education has been provided regarding the importance of this vaccine but patient still declined. Advised may receive this vaccine at local pharmacy or Health Dept.or  vaccine clinic. Aware to provide a copy of the vaccination record if obtained from local pharmacy or Health Dept. Verbalized acceptance and understanding.  Qualifies for Shingles Vaccine? Yes   Zostavax completed No   Shingrix Completed?: No.    Education has been provided regarding the importance of this vaccine. Patient has been advised to call insurance company to determine out of pocket expense if they have not yet received this vaccine. Advised may also receive vaccine at local pharmacy or Health Dept. Verbalized acceptance and understanding.  Screening Tests Health Maintenance  Topic Date Due   OPHTHALMOLOGY EXAM  Never done   COVID-19 Vaccine (4 - 2024-25 season) 11/19/2023 (Originally 06/20/2023)   HEMOGLOBIN A1C  01/02/2024   Diabetic kidney evaluation - eGFR measurement  07/04/2024   Diabetic kidney evaluation - Urine ACR  07/04/2024   FOOT EXAM  07/04/2024    Medicare Annual Wellness (AWV)  11/02/2024   Colonoscopy  05/15/2026   DTaP/Tdap/Td (4 - Td or Tdap) 06/20/2032   Pneumonia Vaccine 19+ Years old  Completed   INFLUENZA VACCINE  Completed   DEXA SCAN  Completed   Hepatitis C Screening  Completed   HPV VACCINES  Aged Out   Zoster Vaccines- Shingrix  Discontinued    Health Maintenance  Health Maintenance Due  Topic Date Due   OPHTHALMOLOGY EXAM  Never done    Colorectal cancer screening: Type of screening: Colonoscopy. Completed 05/15/2021. Repeat every 5 years  Mammogram status: Completed 06/02/2023. Repeat every year  Bone Density status: Ordered 11/03/23. Pt provided with contact info and advised to call to schedule appt.  Lung Cancer Screening: (Low Dose CT Chest recommended if Age 34-80 years, 20 pack-year currently smoking OR have quit w/in 15years.) does not qualify.   Lung Cancer Screening Referral: N/A  Additional Screening:  Hepatitis C Screening: does qualify; Completed 09/11/2008  Vision Screening: Recommended annual ophthalmology exams for early detection of glaucoma and other disorders of the eye. Is the patient up to date with their annual eye exam?  Yes  Who is the provider or what is the name of the office in which the patient attends annual eye exams? Dr. Candi Chafe If pt is not established with a provider, would they like to be referred to a provider to establish care? No .   Dental Screening: Recommended annual dental exams for proper oral hygiene  Diabetic Foot Exam: Diabetic Foot Exam: Completed 07/05/2023  Community Resource Referral / Chronic Care Management: CRR required this visit?  No   CCM required this visit?  No     Plan:     I have personally reviewed and noted the following in the patient's chart:   Medical and social history Use of alcohol , tobacco or illicit drugs  Current medications and supplements including opioid prescriptions. Patient is not currently taking opioid  prescriptions. Functional ability and status Nutritional status Physical activity Advanced directives List of other physicians Hospitalizations, surgeries, and ER visits in previous 12 months Vitals Screenings to include cognitive, depression, and falls Referrals and appointments  In addition, I have reviewed and discussed with patient certain preventive protocols, quality metrics, and best practice recommendations. A written personalized care plan for preventive services as well as general preventive health recommendations were provided to patient.     [name], CMA   11/03/2023   After Visit Summary: (MyChart) Due to this being a telephonic visit, the after visit summary with patients personalized plan was offered to patient via MyChart   Nurse  Notes: Patient is due for a DEXA and order has been placed today.  She is caught up on all her health maintenance.  She stated that she has had an eye exam this past year and I have requested records today.  Patient had no concerns to address today.

## 2023-11-03 NOTE — Patient Instructions (Signed)
 [name] , Thank you for taking time to come for your Medicare Wellness Visit. I appreciate your ongoing commitment to your health goals. Please review the following plan we discussed and let me know if I can assist you in the future.   Referrals/Orders/Follow-Ups/Clinician Recommendations: It was nice talking to you today.  Each day, aim for 6 glasses of water , plenty of protein in your diet and try to get up and walk/ stretch every hour for 5-10 minutes at a time.  You have an order for:  [x]   Bone Density     Please call for appointment:  [facility] [address] [place], [place] [address] [phone]  Make sure to wear two-piece clothing.  No lotions, powders, or deodorants the day of the appointment. Make sure to bring picture ID and insurance card.  Bring list of medications you are currently taking including any supplements.   Schedule your [place] screening mammogram through MyChart!   Log into your MyChart account.  Go to 'Visit' (or 'Appointments' if on mobile App) --> Schedule an Appointment  Under 'Select a Reason for Visit' choose the Mammogram Screening option.  Complete the pre-visit questions and select the time and place that best fits your schedule.    This is a list of the screening recommended for you and due dates:  Health Maintenance  Topic Date Due   Eye exam for diabetics  Never done   COVID-19 Vaccine (4 - 2024-25 season) 11/19/2023*   Hemoglobin A1C  01/02/2024   Yearly kidney function blood test for diabetes  07/04/2024   Yearly kidney health urinalysis for diabetes  07/04/2024   Complete foot exam   07/04/2024   Medicare Annual Wellness Visit  11/02/2024   Colon Cancer Screening  05/15/2026   DTaP/Tdap/Td vaccine (4 - Td or Tdap) 06/20/2032   Pneumonia Vaccine  Completed   Flu Shot  Completed   DEXA scan (bone density measurement)  Completed   Hepatitis C Screening  Completed   HPV Vaccine  Aged Out   Zoster  (Shingles) Vaccine  Discontinued  *Topic was postponed. The date shown is not the original due date.    Advanced directives: (Copy Requested) Please bring a copy of your health care power of attorney and living will to the office to be added to your chart at your convenience.  Next Medicare Annual Wellness Visit scheduled for next year: Yes

## 2023-11-10 ENCOUNTER — Other Ambulatory Visit: Payer: Self-pay | Admitting: Internal Medicine

## 2023-11-10 ENCOUNTER — Other Ambulatory Visit: Payer: Self-pay

## 2023-11-10 DIAGNOSIS — L03116 Cellulitis of left lower limb: Secondary | ICD-10-CM

## 2023-11-10 DIAGNOSIS — N1832 Chronic kidney disease, stage 3b: Secondary | ICD-10-CM

## 2023-11-11 DIAGNOSIS — M48062 Spinal stenosis, lumbar region with neurogenic claudication: Secondary | ICD-10-CM | POA: Diagnosis not present

## 2023-12-20 ENCOUNTER — Other Ambulatory Visit: Payer: Self-pay | Admitting: Internal Medicine

## 2023-12-20 NOTE — Telephone Encounter (Unsigned)
 Copied from CRM [id number]. Topic: Clinical - Medication Refill >> Dec 20, 2023  4:02 PM Sim Boast F wrote: Most Recent Primary Care Visit:  Provider: Wyvonne Lenz  Department: [facility]  Visit Type: MEDICARE AWV, SEQUENTIAL  Date: 11/03/2023  Medication: potassium chloride, atorvastatin, diltiazem   Has the patient contacted their pharmacy? Yes (Agent: If no, request that the patient contact the pharmacy for the refill. If patient does not wish to contact the pharmacy document the reason why and proceed with request.) (Agent: If yes, when and what did the pharmacy advise?)  Is this the correct pharmacy for this prescription? Yes If no, delete pharmacy and type the correct one.  This is the patient's preferred pharmacy:    [facility] [place] - [place], [place] - [address] [place] [address] Phone: [phone] Fax: [phone]   Has the prescription been filled recently? Yes  Is the patient out of the medication? No, she is almost out   Has the patient been seen for an appointment in the last year OR does the patient have an upcoming appointment? Yes  Can we respond through MyChart? Yes  Agent: Please be advised that Rx refills may take up to 3 business days. We ask that you follow-up with your pharmacy.

## 2023-12-21 DIAGNOSIS — F1120 Opioid dependence, uncomplicated: Secondary | ICD-10-CM | POA: Diagnosis not present

## 2023-12-21 DIAGNOSIS — M48062 Spinal stenosis, lumbar region with neurogenic claudication: Secondary | ICD-10-CM | POA: Diagnosis not present

## 2023-12-21 MED ORDER — DILTIAZEM HCL ER 240 MG PO CP24: 240.0000 mg | ORAL_CAPSULE | Freq: Every day | ORAL | 3 refills | Status: DC

## 2023-12-21 MED ORDER — POTASSIUM CHLORIDE CRYS ER 10 MEQ PO TBCR: 10.0000 meq | EXTENDED_RELEASE_TABLET | Freq: Every day | ORAL | 3 refills | Status: DC

## 2023-12-21 MED ORDER — ATORVASTATIN CALCIUM 40 MG PO TABS: 40.0000 mg | ORAL_TABLET | Freq: Every day | ORAL | 3 refills | Status: DC

## 2024-01-03 ENCOUNTER — Ambulatory Visit: Payer: Medicare Other | Admitting: Internal Medicine

## 2024-01-03 ENCOUNTER — Ambulatory Visit (INDEPENDENT_AMBULATORY_CARE_PROVIDER_SITE_OTHER): Admitting: Internal Medicine

## 2024-01-03 ENCOUNTER — Encounter: Payer: Self-pay | Admitting: Internal Medicine

## 2024-01-03 VITALS — BP 126/74 | HR 75 | Temp 97.5°F | Ht 59.0 in | Wt 230.0 lb

## 2024-01-03 DIAGNOSIS — E7800 Pure hypercholesterolemia, unspecified: Secondary | ICD-10-CM | POA: Diagnosis not present

## 2024-01-03 DIAGNOSIS — N1832 Chronic kidney disease, stage 3b: Secondary | ICD-10-CM | POA: Diagnosis not present

## 2024-01-03 DIAGNOSIS — E1165 Type 2 diabetes mellitus with hyperglycemia: Secondary | ICD-10-CM

## 2024-01-03 DIAGNOSIS — I10 Essential (primary) hypertension: Secondary | ICD-10-CM | POA: Diagnosis not present

## 2024-01-03 DIAGNOSIS — M1712 Unilateral primary osteoarthritis, left knee: Secondary | ICD-10-CM

## 2024-01-03 DIAGNOSIS — E559 Vitamin D deficiency, unspecified: Secondary | ICD-10-CM

## 2024-01-03 LAB — LIPID PANEL
Cholesterol: 169 mg/dL (ref 0–200)
HDL: 53.1 mg/dL (ref >39.00)
LDL Cholesterol: 70 mg/dL (ref 0–99)
NonHDL: 116.25
Total CHOL/HDL Ratio: 3
Triglycerides: 230 mg/dL — ABNORMAL HIGH (ref 0.0–149.0)
VLDL: 46 mg/dL — ABNORMAL HIGH (ref 0.0–40.0)

## 2024-01-03 LAB — HEPATIC FUNCTION PANEL
ALT: 17 U/L (ref 0–35)
AST: 17 U/L (ref 0–37)
Albumin: 4.2 g/dL (ref 3.5–5.2)
Alkaline Phosphatase: 118 U/L — ABNORMAL HIGH (ref 39–117)
Bilirubin, Direct: 0 mg/dL (ref 0.0–0.3)
Total Bilirubin: 0.4 mg/dL (ref 0.2–1.2)
Total Protein: 7.1 g/dL (ref 6.0–8.3)

## 2024-01-03 LAB — HEMOGLOBIN A1C: Hgb A1c MFr Bld: 6.8 % — ABNORMAL HIGH (ref 4.6–6.5)

## 2024-01-03 LAB — BASIC METABOLIC PANEL
BUN: 29 mg/dL — ABNORMAL HIGH (ref 6–23)
CO2: 29 meq/L (ref 19–32)
Calcium: 9.7 mg/dL (ref 8.4–10.5)
Chloride: 103 meq/L (ref 96–112)
Creatinine, Ser: 0.98 mg/dL (ref 0.40–1.20)
GFR: 55.91 mL/min — ABNORMAL LOW (ref >60.00)
Glucose, Bld: 92 mg/dL (ref 70–99)
Potassium: 4 meq/L (ref 3.5–5.1)
Sodium: 140 meq/L (ref 135–145)

## 2024-01-03 LAB — VITAMIN D 25 HYDROXY (VIT D DEFICIENCY, FRACTURES): VITD: 48.38 ng/mL (ref 30.00–100.00)

## 2024-01-03 NOTE — Assessment & Plan Note (Signed)
 Pt with worsening knee pain, no falls or giveaways, pt advised to see sport medicine  dr Katrinka Blazing for consideration further cortisone vs gel shots, pt does not want surgury

## 2024-01-03 NOTE — Patient Instructions (Signed)
 Please see Dr Katrinka Blazing - sports medicine  - for the left knee as you could have a Gel Shot   Please continue all other medications as before, and refills have been done if requested.  Please have the pharmacy call with any other refills you may need.  Please keep your appointments with your specialists as you may have planned  Please go to the LAB at the blood drawing area for the tests to be done  You will be contacted by phone if any changes need to be made immediately.  Otherwise, you will receive a letter about your results with an explanation, but please check with MyChart first.  Please make an Appointment to return in 6 months, or sooner if needed

## 2024-01-03 NOTE — Assessment & Plan Note (Signed)
 BP Readings from Last 3 Encounters:  01/03/24 126/74  10/05/23 134/72  09/15/23 110/70   Stable, pt to continue medical treatment dilacor xr 240 qd

## 2024-01-03 NOTE — Assessment & Plan Note (Signed)
 Lab Results  Component Value Date   CREATININE 0.98 01/03/2024   Stable overall, cont to avoid nephrotoxins

## 2024-01-03 NOTE — Assessment & Plan Note (Signed)
 Lab Results  Component Value Date   LDLCALC 70 01/03/2024   Uncontrolled, goal ldl < 70,, pt to continue current statin lipitor 40 every day, declines add zetia or other

## 2024-01-03 NOTE — Progress Notes (Signed)
 The test results show that your current treatment is OK, as the tests are stable.  Please continue the same plan.  There is no other need for change of treatment or further evaluation based on these results, at this time.  thanks

## 2024-01-03 NOTE — Assessment & Plan Note (Signed)
 Last vitamin D Lab Results  Component Value Date   VD25OH 48.38 01/03/2024   Stable, cont oral replacement

## 2024-01-03 NOTE — Progress Notes (Signed)
 Patient ID: [name], female   DOB: 1946-09-03, 78 y.o.   MRN: [id number]        Chief Complaint: follow up HTN, HLD and DM, ckd3, low vit d       HPI:  [name] is a 78 y.o. female here overall doing ok, Pt denies chest pain, increased sob or doe, wheezing, orthopnea, PND, increased LE swelling, palpitations, dizziness or syncope.   Pt denies polydipsia, polyuria, or new focal neuro s/s.    Pt denies fever, wt loss, night sweats, loss of appetite, or other constitutional symptoms  Plans to call for eye exam soon.   Does have mild worsening knee pain not well controlled with voltaren gel.  Has had cortisone in past but did not know gel shots were an option   Wt Readings from Last 3 Encounters:  01/03/24 230 lb (104.3 kg)  11/03/23 228 lb (103.4 kg)  10/05/23 228 lb (103.4 kg)   BP Readings from Last 3 Encounters:  01/03/24 126/74  10/05/23 134/72  09/15/23 110/70         Past Medical History:  Diagnosis Date   Allergic rhinitis, cause unspecified    Allergy    SEASONAL   Anemia    Cataract    Bilateral   CHF (congestive heart failure) (HCC)    Chronic headaches    Constipation    Diverticulosis    DJD (degenerative joint disease), lumbar    GERD (gastroesophageal reflux disease)    Hemorrhoids    Hiatal hernia    HTN (hypertension)    Hyperlipidemia    Obesity    Osteoarthritis    Tubular adenoma of colon    Past Surgical History:  Procedure Laterality Date   ABLATION     uterus   COLONOSCOPY  05/15/2021   [facility]   TOTAL KNEE ARTHROPLASTY Right 04/14/2019   Procedure: TOTAL KNEE ARTHROPLASTY;  Surgeon: Jodi Geralds, MD;  Location: [facility];  Service: Orthopedics;  Laterality: Right;   TUBAL LIGATION      reports that she has never smoked. She has never been exposed to tobacco smoke. She has never used smokeless tobacco. She reports that she does not drink alcohol and does not use drugs. family history includes Breast cancer in her sister;  Diabetes in her brother; Heart disease in her mother; Hypertension in her mother. Allergies  Allergen Reactions   Doxycycline Itching    Rash, itching   Oxycodone    Soybean-Containing Drug Products Other (See Comments)    Allergy testing positive for soy beans but pt uses soy sauce   Sulfonamide Derivatives Itching and Rash   Current Outpatient Medications on File Prior to Visit  Medication Sig Dispense Refill   aspirin-acetaminophen-caffeine (EXCEDRIN MIGRAINE) 250-250-65 MG tablet Take 1 tablet by mouth every 6 (six) hours as needed for headache.     atorvastatin (LIPITOR) 40 MG tablet Take 1 tablet (40 mg total) by mouth daily. 90 tablet 3   azelastine (ASTELIN) 0.1 % nasal spray Place 1 spray into both nostrils 2 (two) times daily. Use in each nostril as directed 90 mL 4   azelastine (OPTIVAR) 0.05 % ophthalmic solution Place 1 drop into both eyes 2 (two) times daily. 6 mL 12   betamethasone, augmented, (DIPROLENE) 0.05 % lotion Apply topically daily as needed.     cetirizine (ZYRTEC) 10 MG tablet Take 1 tablet (10 mg total) by mouth daily. As needed (Patient taking differently: Take 10 mg by mouth daily as  needed for allergies. As needed) 90 tablet 3   Cholecalciferol 50 MCG (2000 UT) TABS 1 tab by mouth once daily 30 tablet 99   diltiazem (DILACOR XR) 240 MG 24 hr capsule Take 1 capsule (240 mg total) by mouth daily. 90 capsule 3   DULoxetine (CYMBALTA) 20 MG capsule TAKE 2 CAPSULES EVERY DAY 180 capsule 3   ferrous sulfate 325 (65 FE) MG tablet Take 1 tablet (325 mg total) by mouth 2 (two) times daily with a meal.  3   furosemide (LASIX) 80 MG tablet Take 1 tablet (80 mg total) by mouth 2 (two) times daily. 180 tablet 3   gabapentin (NEURONTIN) 100 MG capsule Take 2 capsules (200 mg total) by mouth at bedtime. 180 capsule 1   HYDROcodone-acetaminophen (NORCO/VICODIN) 5-325 MG tablet Take 0.5 tablets by mouth daily as needed.     hydrocortisone 2.5 % lotion Apply topically daily.      hydrOXYzine (ATARAX) 10 MG tablet TAKE 1 TABLET(10 MG) BY MOUTH AT BEDTIME AS NEEDED FOR ITCHING 60 tablet 2   ketoconazole (NIZORAL) 2 % shampoo Apply topically once a week.     losartan (COZAAR) 100 MG tablet Take 1 tablet (100 mg total) by mouth daily. 90 tablet 3   metoprolol tartrate (LOPRESSOR) 50 MG tablet Take 1 tablet (50 mg total) by mouth 2 (two) times daily. 180 tablet 3   ondansetron (ZOFRAN) 4 MG tablet Take 1 tablet (4 mg total) by mouth every 8 (eight) hours as needed for nausea or vomiting. 40 tablet 1   pantoprazole (PROTONIX) 40 MG tablet Take 1 tablet (40 mg total) by mouth daily. 90 tablet 3   pantoprazole (PROTONIX) 40 MG tablet TAKE 1 TABLET(40 MG) BY MOUTH DAILY 90 tablet 0   Polyethyl Glycol-Propyl Glycol (SYSTANE OP) Place 1 drop into both eyes 3 (three) times daily as needed (dry eyes).     Polyethylene Glycol 3350 (MIRALAX PO) Take 17 g by mouth daily as needed (constipation).      potassium chloride (KLOR-CON M) 10 MEQ tablet Take 1 tablet (10 mEq total) by mouth daily. 90 tablet 3   promethazine-dextromethorphan (PROMETHAZINE-DM) 6.25-15 MG/5ML syrup Take 5 mLs by mouth 4 (four) times daily as needed for cough. 118 mL 0   tiZANidine (ZANAFLEX) 2 MG tablet TAKE 1 TABLET EVERY 6 HOURS AS NEEDED FOR MUSCLE SPASMS 60 tablet 3   traMADol (ULTRAM) 50 MG tablet Take 1-2 tablets by mouth every 6 (six) hours as needed.     triamcinolone (NASACORT) 55 MCG/ACT AERO nasal inhaler Place 2 sprays into the nose daily. 1 each 12   triamcinolone cream (KENALOG) 0.1 % Apply 1 application topically 2 (two) times daily. 453 g 0   vitamin B-12 (CYANOCOBALAMIN) 1000 MCG tablet Take 1,000 mcg by mouth daily.     No current facility-administered medications on file prior to visit.        ROS:  All others reviewed and negative.  Objective        PE:  BP 126/74 (BP Location: Right Arm, Patient Position: Sitting, Cuff Size: Normal)   Pulse 75   Temp (!) 97.5 F (36.4 C) (Oral)   Ht 4'  11" (1.499 m)   Wt 230 lb (104.3 kg)   SpO2 98%   BMI 46.45 kg/m                 Constitutional: Pt appears in NAD  HENT: Head: NCAT.                Right Ear: External ear normal.                 Left Ear: External ear normal.                Eyes: . Pupils are equal, round, and reactive to light. Conjunctivae and EOM are normal               Nose: without d/c or deformity               Neck: Neck supple. Gross normal ROM               Cardiovascular: Normal rate and regular rhythm.                 Pulmonary/Chest: Effort normal and breath sounds without rales or wheezing.                Abd:  Soft, NT, ND, + BS, no organomegaly               Neurological: Pt is alert. At baseline orientation, motor grossly intact               Skin: Skin is warm. No rashes, no other new lesions, LE edema - none               Psychiatric: Pt behavior is normal without agitation   Micro: none  Cardiac tracings I have personally interpreted today:  none  Pertinent Radiological findings (summarize): none   Lab Results  Component Value Date   WBC 8.0 07/05/2023   HGB 12.0 07/05/2023   HCT 37.2 07/05/2023   PLT 314.0 07/05/2023   GLUCOSE 92 01/03/2024   CHOL 169 01/03/2024   TRIG 230.0 (H) 01/03/2024   HDL 53.10 01/03/2024   LDLDIRECT 93.0 01/14/2021   LDLCALC 70 01/03/2024   ALT 17 01/03/2024   AST 17 01/03/2024   NA 140 01/03/2024   K 4.0 01/03/2024   CL 103 01/03/2024   CREATININE 0.98 01/03/2024   BUN 29 (H) 01/03/2024   CO2 29 01/03/2024   TSH 0.69 07/05/2023   INR 1.2 08/13/2020   HGBA1C 6.8 (H) 01/03/2024   MICROALBUR 11.4 (H) 07/05/2023   Assessment/Plan:  [name] is a 78 y.o. Black or African American [2] female with  has a past medical history of Allergic rhinitis, cause unspecified, Allergy, Anemia, Cataract, CHF (congestive heart failure) (HCC), Chronic headaches, Constipation, Diverticulosis, DJD (degenerative joint disease), lumbar, GERD  (gastroesophageal reflux disease), Hemorrhoids, Hiatal hernia, HTN (hypertension), Hyperlipidemia, Obesity, Osteoarthritis, and Tubular adenoma of colon.  Degenerative arthritis of left knee Pt with worsening knee pain, no falls or giveaways, pt advised to see sport medicine  dr Katrinka Blazing for consideration further cortisone vs gel shots, pt does not want surgury  Diabetes (HCC) Lab Results  Component Value Date   HGBA1C 6.8 (H) 01/03/2024   Stable, pt to continue current medical treatment  - diet, wt control   HLD (hyperlipidemia) Lab Results  Component Value Date   LDLCALC 70 01/03/2024   Uncontrolled, goal ldl < 70,, pt to continue current statin lipitor 40 every day, declines add zetia or other   CKD (chronic kidney disease) stage 3, GFR 30-59 ml/min (HCC) Lab Results  Component Value Date   CREATININE 0.98 01/03/2024   Stable overall, cont to avoid nephrotoxins   Essential (primary) hypertension BP  Readings from Last 3 Encounters:  01/03/24 126/74  10/05/23 134/72  09/15/23 110/70   Stable, pt to continue medical treatment dilacor xr 240 qd    Vitamin D deficiency Last vitamin D Lab Results  Component Value Date   VD25OH 48.38 01/03/2024   Stable, cont oral replacement  Followup: Return in about 6 months (around 07/05/2024).  Oliver Barre, MD 01/03/2024 9:29 PM [facility]

## 2024-01-03 NOTE — Assessment & Plan Note (Signed)
 Lab Results  Component Value Date   HGBA1C 6.8 (H) 01/03/2024   Stable, pt to continue current medical treatment  - diet, wt control

## 2024-01-19 DIAGNOSIS — L218 Other seborrheic dermatitis: Secondary | ICD-10-CM | POA: Diagnosis not present

## 2024-01-19 DIAGNOSIS — L2989 Other pruritus: Secondary | ICD-10-CM | POA: Diagnosis not present

## 2024-01-19 DIAGNOSIS — L648 Other androgenic alopecia: Secondary | ICD-10-CM | POA: Diagnosis not present

## 2024-01-19 DIAGNOSIS — L853 Xerosis cutis: Secondary | ICD-10-CM | POA: Diagnosis not present

## 2024-01-27 ENCOUNTER — Telehealth: Payer: Self-pay | Admitting: Internal Medicine

## 2024-01-27 NOTE — Telephone Encounter (Signed)
 Copied from CRM [id number]. Topic: General - Other >> Jan 26, 2024  3:43 PM Priscille Kluver wrote: Reason for CRM: Patient is calling because her $50 rewards is not on her insurance card from her wellness visit, she stated she spoke to a nurse at her clinic and they advise to contact us. I advised patient to contact [employer], she states she was inform to contact us. She will like for someone to assist her with this matter.  ---  Is there anything we can do about this?

## 2024-02-17 ENCOUNTER — Other Ambulatory Visit: Payer: Self-pay | Admitting: Internal Medicine

## 2024-02-17 DIAGNOSIS — N1832 Chronic kidney disease, stage 3b: Secondary | ICD-10-CM

## 2024-02-17 DIAGNOSIS — J22 Unspecified acute lower respiratory infection: Secondary | ICD-10-CM

## 2024-02-17 DIAGNOSIS — R058 Other specified cough: Secondary | ICD-10-CM

## 2024-02-17 DIAGNOSIS — L03116 Cellulitis of left lower limb: Secondary | ICD-10-CM

## 2024-02-17 NOTE — Telephone Encounter (Signed)
 Copied from CRM [id number]. Topic: Clinical - Medication Refill >> Feb 17, 2024  1:00 PM [place] J wrote: Most Recent Primary Care Visit:  Provider: Roslyn Coombe  Department: [facility]  Visit Type: OFFICE VISIT  Date: 01/03/2024  Medication:  atorvastatin  (LIPITOR) 40 MG tablet azelastine  (ASTELIN ) 0.1 % nasal spray azelastine  (OPTIVAR ) 0.05 % ophthalmic solution betamethasone , augmented, (DIPROLENE ) 0.05 % lotion cetirizine  (ZYRTEC ) 10 MG tablet Cholecalciferol  50 MCG (2000 UT) TABS diltiazem  (DILACOR XR ) 240 MG 24 hr capsule DULoxetine  (CYMBALTA ) 20 MG capsule ferrous sulfate  325 (65 FE) MG tablet furosemide  (LASIX ) 80 MG tablet gabapentin  (NEURONTIN ) 100 MG capsule HYDROcodone -acetaminophen  (NORCO/VICODIN) 5-325 MG tablet hydrocortisone  2.5 % lotion hydrOXYzine  (ATARAX ) 10 MG tablet ketoconazole (NIZORAL) 2 % shampoo losartan  (COZAAR ) 100 MG tablet metoprolol  tartrate (LOPRESSOR ) 50 MG tablet ondansetron  (ZOFRAN ) 4 MG tablet pantoprazole  (PROTONIX ) 40 MG tablet pantoprazole  (PROTONIX ) 40 MG tablet Polyethyl Glycol-Propyl Glycol (SYSTANE OP) Polyethylene Glycol 3350  (MIRALAX  PO) potassium chloride  (KLOR-CON  M) 10 MEQ tablet promethazine -dextromethorphan (PROMETHAZINE -DM) 6.25-15 MG/5ML syrup tiZANidine  (ZANAFLEX ) 2 MG tablet traMADol  (ULTRAM ) 50 MG tablet triamcinolone  (NASACORT ) 55 MCG/ACT AERO nasal inhaler triamcinolone  cream (KENALOG ) 0.1 % vitamin B-12 (CYANOCOBALAMIN ) 1000 MCG tablet  Has the patient contacted their pharmacy? Yes (Agent: If no, request that the patient contact the pharmacy for the refill. If patient does not wish to contact the pharmacy document the reason why and proceed with request.) (Agent: If yes, when and what did the pharmacy advise?) Pharmacy instructed patient to call primary to transfer all medications.  Is this the correct pharmacy for this prescription? Yes If no, delete pharmacy and type the correct one.  This is the patient's  preferred pharmacy:   [facility] [place]  - [place], [place] - [address] [place] [address] Phone: [phone] Fax: [phone]  Has the prescription been filled recently? No  Is the patient out of the medication? No  Has the patient been seen for an appointment in the last year OR does the patient have an upcoming appointment? Yes  Can we respond through MyChart? No  Agent: Please be advised that Rx refills may take up to 3 business days. We ask that you follow-up with your pharmacy.

## 2024-02-18 MED ORDER — ONDANSETRON HCL 4 MG PO TABS: 4.0000 mg | ORAL_TABLET | Freq: Three times a day (TID) | ORAL | 1 refills | Status: AC | PRN

## 2024-02-18 MED ORDER — AZELASTINE HCL 0.05 % OP SOLN: 1.0000 [drp] | Freq: Two times a day (BID) | OPHTHALMIC | 12 refills | Status: AC

## 2024-02-18 MED ORDER — HYDROCORTISONE 2.5 % EX LOTN: TOPICAL_LOTION | Freq: Every day | CUTANEOUS | 1 refills | Status: AC

## 2024-02-18 MED ORDER — DULOXETINE HCL 20 MG PO CPEP: ORAL_CAPSULE | ORAL | 3 refills | Status: DC

## 2024-02-18 MED ORDER — KETOCONAZOLE 2 % EX SHAM: MEDICATED_SHAMPOO | CUTANEOUS | 1 refills | Status: AC

## 2024-02-18 MED ORDER — GABAPENTIN 100 MG PO CAPS: 200.0000 mg | ORAL_CAPSULE | Freq: Every day | ORAL | 1 refills | Status: DC

## 2024-02-18 MED ORDER — VITAMIN B-12 1000 MCG PO TABS: 1000.0000 ug | ORAL_TABLET | Freq: Every day | ORAL | 3 refills | Status: AC

## 2024-02-18 MED ORDER — TRIAMCINOLONE ACETONIDE 0.1 % EX CREA: 1.0000 | TOPICAL_CREAM | Freq: Two times a day (BID) | CUTANEOUS | 0 refills | Status: DC

## 2024-02-18 MED ORDER — POLYETHYLENE GLYCOL 3350 17 GM/SCOOP PO POWD: 17.0000 g | Freq: Every day | ORAL | 3 refills | Status: AC | PRN

## 2024-02-18 MED ORDER — BETAMETHASONE DIPROPIONATE AUG 0.05 % EX LOTN: TOPICAL_LOTION | Freq: Every day | CUTANEOUS | 1 refills | Status: AC | PRN

## 2024-02-18 MED ORDER — DILTIAZEM HCL ER 240 MG PO CP24: 240.0000 mg | ORAL_CAPSULE | Freq: Every day | ORAL | 3 refills | Status: DC

## 2024-02-18 MED ORDER — AZELASTINE HCL 0.1 % NA SOLN
1.0000 | Freq: Two times a day (BID) | NASAL | 4 refills | Status: AC
Start: 2024-02-18 — End: ?

## 2024-02-18 MED ORDER — PANTOPRAZOLE SODIUM 40 MG PO TBEC: 40.0000 mg | DELAYED_RELEASE_TABLET | Freq: Every day | ORAL | 3 refills | Status: DC

## 2024-02-18 MED ORDER — PANTOPRAZOLE SODIUM 40 MG PO TBEC: DELAYED_RELEASE_TABLET | ORAL | 3 refills | Status: DC

## 2024-02-18 MED ORDER — POTASSIUM CHLORIDE CRYS ER 10 MEQ PO TBCR: 10.0000 meq | EXTENDED_RELEASE_TABLET | Freq: Every day | ORAL | 3 refills | Status: DC

## 2024-02-18 MED ORDER — ATORVASTATIN CALCIUM 40 MG PO TABS: 40.0000 mg | ORAL_TABLET | Freq: Every day | ORAL | 3 refills | Status: DC

## 2024-02-18 MED ORDER — CETIRIZINE HCL 10 MG PO TABS
10.0000 mg | ORAL_TABLET | Freq: Every day | ORAL | 3 refills | Status: AC | PRN
Start: 2024-02-18 — End: ?

## 2024-02-18 MED ORDER — FUROSEMIDE 80 MG PO TABS: 80.0000 mg | ORAL_TABLET | Freq: Two times a day (BID) | ORAL | 3 refills | Status: DC

## 2024-02-18 MED ORDER — FERROUS SULFATE 325 (65 FE) MG PO TABS: 325.0000 mg | ORAL_TABLET | Freq: Two times a day (BID) | ORAL | 2 refills | Status: AC

## 2024-02-18 MED ORDER — ASPIRIN-ACETAMINOPHEN-CAFFEINE 250-250-65 MG PO TABS: 1.0000 | ORAL_TABLET | Freq: Four times a day (QID) | ORAL | 2 refills | Status: AC | PRN

## 2024-02-18 MED ORDER — TRAMADOL HCL 50 MG PO TABS
50.0000 mg | ORAL_TABLET | Freq: Four times a day (QID) | ORAL | 1 refills | Status: AC | PRN
Start: 2024-02-18 — End: ?

## 2024-02-18 MED ORDER — TIZANIDINE HCL 2 MG PO TABS: 2.0000 mg | ORAL_TABLET | Freq: Three times a day (TID) | ORAL | 3 refills | Status: AC | PRN

## 2024-02-18 MED ORDER — METOPROLOL TARTRATE 50 MG PO TABS: 50.0000 mg | ORAL_TABLET | Freq: Two times a day (BID) | ORAL | 3 refills | Status: DC

## 2024-02-18 MED ORDER — LOSARTAN POTASSIUM 100 MG PO TABS: 100.0000 mg | ORAL_TABLET | Freq: Every day | ORAL | 3 refills | Status: DC

## 2024-02-18 MED ORDER — TRIAMCINOLONE ACETONIDE 55 MCG/ACT NA AERO: 2.0000 | INHALATION_SPRAY | Freq: Every day | NASAL | 12 refills | Status: AC

## 2024-02-18 MED ORDER — CHOLECALCIFEROL 50 MCG (2000 UT) PO TABS: ORAL_TABLET | ORAL | 3 refills | Status: AC

## 2024-02-22 ENCOUNTER — Telehealth: Payer: Self-pay | Admitting: Internal Medicine

## 2024-02-22 ENCOUNTER — Other Ambulatory Visit: Payer: Self-pay

## 2024-02-22 MED ORDER — FUROSEMIDE 80 MG PO TABS: 80.0000 mg | ORAL_TABLET | Freq: Two times a day (BID) | ORAL | 3 refills | Status: DC

## 2024-02-22 MED ORDER — METOPROLOL TARTRATE 50 MG PO TABS: 50.0000 mg | ORAL_TABLET | Freq: Two times a day (BID) | ORAL | 3 refills | Status: DC

## 2024-02-22 MED ORDER — DULOXETINE HCL 20 MG PO CPEP: ORAL_CAPSULE | ORAL | 3 refills | Status: DC

## 2024-02-22 NOTE — Telephone Encounter (Signed)
 Copied from CRM [id number]. Topic: Clinical - Prescription Issue >> Feb 22, 2024 11:28 AM Adonis Hoot wrote: Reason for CRM: Patient would like to know if she could have medication  DULoxetine  (CYMBALTA ) 20 MG capsule metoprolol  tartrate (LOPRESSOR ) 50 MG tablet furosemide  (LASIX ) 80 MG tablet Sent to [id number] - [place], [place] - [address] AT [facility]  Phone: [phone] Fax: [phone]  Patient stated that she is having a hard time with the mail order and they keep saying that they have not received the prescriptions that were sent over on May 02/18/2024.

## 2024-02-22 NOTE — Telephone Encounter (Signed)
 Refill has been sent.

## 2024-03-06 ENCOUNTER — Encounter (HOSPITAL_COMMUNITY): Payer: Self-pay

## 2024-03-06 ENCOUNTER — Ambulatory Visit (HOSPITAL_COMMUNITY)
Admission: EM | Admit: 2024-03-06 | Discharge: 2024-03-06 | Disposition: A | Discharge status: Home / Self Care | Attending: Emergency Medicine | Admitting: Emergency Medicine

## 2024-03-06 DIAGNOSIS — S81811A Laceration without foreign body, right lower leg, initial encounter: Secondary | ICD-10-CM

## 2024-03-06 MED ORDER — CEPHALEXIN 500 MG PO CAPS
500.0000 mg | ORAL_CAPSULE | Freq: Two times a day (BID) | ORAL | 0 refills | Status: AC
Start: 2024-03-06 — End: 2024-03-13

## 2024-03-06 MED ORDER — LIDOCAINE HCL 2 % IJ SOLN
INTRAMUSCULAR | Status: AC
  Filled 2024-03-06: qty 20

## 2024-03-06 NOTE — ED Triage Notes (Signed)
 Pt states fell into her walker around 2pm. Lac noted to RUL and bruise to lt neck. Denies LOC. Bandage applied and bleeding controlled.

## 2024-03-06 NOTE — Discharge Instructions (Signed)
 Today we placed 9 sutures in your right upper leg.  Take the Keflex  as prescribed to help prevent bacterial infection.  Keep the sutures clean and dry for the next 24 hours.  Afterwards you can clean gently with warm water  and antibacterial solution such as Dial and apply topical Neosporin.  Sutures should be removed in 8 to 10 days.    Return to clinic sooner if you develop any signs or symptoms of infection such as warmth, purulent drainage, pain, redness, or new concerning symptoms.

## 2024-03-06 NOTE — ED Provider Notes (Signed)
 [facility]    CSN: [id number] Arrival date & time: 03/06/24  1559      History   Chief Complaint Chief Complaint  Patient presents with   Fall   Laceration    HPI [name] is a 78 y.o. female.   Patient presents to clinic over concerns of a laceration to her right inner leg.  She tripped and fell into her walker around 2 PM.  She immediately saw bleeding and open skin on her right inner thigh so she cleaned the area and applied a bandage.  Bleeding controlled.  Tdap updated last year.  Ambulatory with walker.  Did not lose consciousness.  She is not on blood thinners.  The history is provided by the patient and medical records.  Fall  Laceration   Past Medical History:  Diagnosis Date   Allergic rhinitis, cause unspecified    Allergy     SEASONAL   Anemia    Cataract    Bilateral   CHF (congestive heart failure) (HCC)    Chronic headaches    Constipation    Diverticulosis    DJD (degenerative joint disease), lumbar    GERD (gastroesophageal reflux disease)    Hemorrhoids    Hiatal hernia    HTN (hypertension)    Hyperlipidemia    Obesity    Osteoarthritis    Tubular adenoma of colon     Patient Active Problem List   Diagnosis Date Noted   Left otitis externa 10/05/2023   Generalized weakness 07/08/2023   Diabetes (HCC) 07/05/2023   HLD (hyperlipidemia) 07/05/2023   Cerumen impaction 12/21/2022   Conductive hearing loss, bilateral 12/21/2022   Cough productive of yellow sputum 08/18/2022   LRTI (lower respiratory tract infection) 08/18/2022   Allergic rhinitis 01/24/2022   COVID-19 virus infection 11/23/2021   CKD (chronic kidney disease) stage 3, GFR 30-59 ml/min (HCC) 07/14/2021   Vitamin D  deficiency 07/14/2021   Pain and swelling of lower leg, left 05/15/2021   Cellulitis of lower leg 01/14/2021   Sepsis due to cellulitis (HCC) 08/13/2020   Cellulitis of right lower extremity 08/13/2020   Injury of back due to fall  08/13/2020   Microcytic anemia 08/13/2020   Lactic acidosis 08/13/2020   Sepsis (HCC) 08/13/2020   Aortic atherosclerosis (HCC) 08/07/2020   Itching 08/07/2020   Leg skin lesion, left 08/07/2020   Cellulitis of left leg 01/11/2020   Pulmonary embolism (HCC) 12/28/2019   Leg wound, left 11/12/2019   Insomnia 09/23/2019   Peripheral edema 07/20/2019   Primary osteoarthritis of right knee 04/14/2019   Acute bursitis of right shoulder 08/16/2017   Abnormal CXR 07/26/2017   Unexplained night sweats 07/26/2017   Greater trochanteric bursitis of right hip 06/23/2017   Sweating abnormality 03/09/2017   Gingivitis 03/09/2017   Herpes zoster without complication 02/10/2017   Trapezoid ligament sprain, right, initial encounter 12/11/2016   Fasciculation 12/11/2016   Acute bronchitis 11/16/2016   Degenerative joint disease of left shoulder 10/07/2016   Degenerative arthritis of knee, bilateral 09/16/2016   Lumbar stenosis with neurogenic claudication 06/17/2016   Compression fracture of L1 lumbar vertebra (HCC) 03/31/2016   Pelvic mass in female 03/31/2016   Urinary frequency 02/27/2016   Chronic maxillary sinusitis 06/06/2015   Carpal tunnel syndrome 09/18/2014   Sprain of ankle 07/04/2014   Primary localized osteoarthrosis, lower leg 05/21/2014   Left knee pain 03/15/2014   Food allergy  10/05/2013   Eustachian tube dysfunction, left 04/24/2013   Degenerative arthritis of left  knee 04/24/2013   Hot flashes 04/24/2013   External otitis of left ear 12/15/2012   Spinal stenosis of lumbar region 04/25/2012   Seasonal and perennial allergic rhinitis 03/18/2012   Hearing loss in right ear 03/18/2012   Esophageal dysphagia 02/11/2012   Dysphagia 01/20/2012   Rash 01/20/2012   Bilateral knee pain 01/20/2012   Encounter for well adult exam with abnormal findings 01/14/2012   GERD (gastroesophageal reflux disease) 01/14/2012   Essential (primary) hypertension 01/14/2012   Mixed  hyperlipidemia 01/14/2012   Body mass index (BMI) 39.0-39.9, adult 01/14/2012   Osteoarthritis 01/14/2012   DJD (degenerative joint disease), lumbar 01/14/2012   Palpitations 11/21/2010   Dyspnea on exertion 11/21/2010    Past Surgical History:  Procedure Laterality Date   ABLATION     uterus   COLONOSCOPY  05/15/2021   [place]   TOTAL KNEE ARTHROPLASTY Right 04/14/2019   Procedure: TOTAL KNEE ARTHROPLASTY;  Surgeon: Neil Balls, MD;  Location: [facility];  Service: Orthopedics;  Laterality: Right;   TUBAL LIGATION      OB History   No obstetric history on file.      Home Medications    Prior to Admission medications   Medication Sig Start Date End Date Taking? Authorizing Provider  cephALEXin  (KEFLEX ) 500 MG capsule Take 1 capsule (500 mg total) by mouth 2 (two) times daily for 7 days. 03/06/24 03/13/24 Yes Harlow Lighter, Jackeline Gutknecht  N, FNP  aspirin -acetaminophen -caffeine  (EXCEDRIN  MIGRAINE) 250-250-65 MG tablet Take 1 tablet by mouth every 6 (six) hours as needed for headache. 02/18/24   Roslyn Coombe, MD  atorvastatin  (LIPITOR) 40 MG tablet Take 1 tablet (40 mg total) by mouth daily. 02/18/24   Roslyn Coombe, MD  azelastine  (ASTELIN ) 0.1 % nasal spray Place 1 spray into both nostrils 2 (two) times daily. Use in each nostril as directed 02/18/24   Roslyn Coombe, MD  azelastine  (OPTIVAR ) 0.05 % ophthalmic solution Place 1 drop into both eyes 2 (two) times daily. 02/18/24   Roslyn Coombe, MD  betamethasone , augmented, (DIPROLENE ) 0.05 % lotion Apply topically daily as needed. 02/18/24   Roslyn Coombe, MD  cetirizine  (ZYRTEC ) 10 MG tablet Take 1 tablet (10 mg total) by mouth daily as needed for allergies. As needed 02/18/24   Roslyn Coombe, MD  Cholecalciferol  50 MCG (2000 UT) TABS 1 tab by mouth once daily 02/18/24   Roslyn Coombe, MD  cyanocobalamin  (VITAMIN B12) 1000 MCG tablet Take 1 tablet (1,000 mcg total) by mouth daily. 02/18/24   Roslyn Coombe, MD  diltiazem  (DILACOR XR ) 240 MG 24 hr capsule Take 1  capsule (240 mg total) by mouth daily. 02/18/24   Roslyn Coombe, MD  DULoxetine  (CYMBALTA ) 20 MG capsule TAKE 2 CAPSULES EVERY DAY 02/22/24   Roslyn Coombe, MD  ferrous sulfate  325 (65 FE) MG tablet Take 1 tablet (325 mg total) by mouth 2 (two) times daily with a meal. 02/18/24   Roslyn Coombe, MD  furosemide  (LASIX ) 80 MG tablet Take 1 tablet (80 mg total) by mouth 2 (two) times daily. 02/22/24   Roslyn Coombe, MD  gabapentin  (NEURONTIN ) 100 MG capsule Take 2 capsules (200 mg total) by mouth at bedtime. 02/18/24   Roslyn Coombe, MD  HYDROcodone -acetaminophen  (NORCO/VICODIN) 5-325 MG tablet Take 0.5 tablets by mouth daily as needed. 06/24/21   [provider]  hydrocortisone  2.5 % lotion Apply topically daily. 02/18/24   Roslyn Coombe, MD  hydrOXYzine  (ATARAX ) 10 MG tablet  TAKE 1 TABLET(10 MG) BY MOUTH AT BEDTIME AS NEEDED FOR ITCHING 11/10/23   Roslyn Coombe, MD  ketoconazole  (NIZORAL ) 2 % shampoo Apply topically once a week. 02/18/24   Roslyn Coombe, MD  losartan  (COZAAR ) 100 MG tablet Take 1 tablet (100 mg total) by mouth daily. 02/18/24   Roslyn Coombe, MD  metoprolol  tartrate (LOPRESSOR ) 50 MG tablet Take 1 tablet (50 mg total) by mouth 2 (two) times daily. 02/22/24   Roslyn Coombe, MD  ondansetron  (ZOFRAN ) 4 MG tablet Take 1 tablet (4 mg total) by mouth every 8 (eight) hours as needed for nausea or vomiting. 02/18/24   Roslyn Coombe, MD  pantoprazole  (PROTONIX ) 40 MG tablet Take 1 tablet (40 mg total) by mouth daily. 02/18/24   Roslyn Coombe, MD  pantoprazole  (PROTONIX ) 40 MG tablet TAKE 1 TABLET(40 MG) BY MOUTH DAILY 02/18/24   Roslyn Coombe, MD  Polyethyl Glycol-Propyl Glycol (SYSTANE OP) Place 1 drop into both eyes 3 (three) times daily as needed (dry eyes).    [provider]  polyethylene glycol powder (MIRALAX ) 17 GM/SCOOP powder Take 17 g by mouth daily as needed (constipation). 02/18/24   Roslyn Coombe, MD  potassium chloride  (KLOR-CON  M) 10 MEQ tablet Take 1 tablet (10 mEq total) by mouth daily.  02/18/24   Roslyn Coombe, MD  promethazine -dextromethorphan (PROMETHAZINE -DM) 6.25-15 MG/5ML syrup Take 5 mLs by mouth 4 (four) times daily as needed for cough. 08/18/22   Arcadio Knuckles, MD  tiZANidine  (ZANAFLEX ) 2 MG tablet Take 1 tablet (2 mg total) by mouth every 8 (eight) hours as needed for muscle spasms. 02/18/24   Roslyn Coombe, MD  traMADol  (ULTRAM ) 50 MG tablet Take 1-2 tablets (50-100 mg total) by mouth every 6 (six) hours as needed. 02/18/24   Roslyn Coombe, MD  triamcinolone  (NASACORT ) 55 MCG/ACT AERO nasal inhaler Place 2 sprays into the nose daily. 02/18/24   Roslyn Coombe, MD  triamcinolone  cream (KENALOG ) 0.1 % Apply 1 Application topically 2 (two) times daily. 02/18/24   Roslyn Coombe, MD    Family History Family History  Problem Relation Age of Onset   Heart disease Mother    Hypertension Mother    Breast cancer Sister    Diabetes Brother    Colon cancer Neg Hx    Esophageal cancer Neg Hx    Rectal cancer Neg Hx    Stomach cancer Neg Hx    Colon polyps Neg Hx     Social History Social History   Tobacco Use   Smoking status: Never    Passive exposure: Never   Smokeless tobacco: Never  Vaping Use   Vaping status: Never Used  Substance Use Topics   Alcohol  use: No    Alcohol /week: 0.0 standard drinks of alcohol    Drug use: No     Allergies   Doxycycline , Oxycodone , Soybean-containing drug products, and Sulfonamide derivatives   Review of Systems Review of Systems  Per HPI  Physical Exam Triage Vital Signs ED Triage Vitals  Encounter Vitals Group     BP 03/06/24 1730 115/72     Systolic BP Percentile --      Diastolic BP Percentile --      Pulse Rate 03/06/24 1728 62     Resp 03/06/24 1728 20     Temp 03/06/24 1728 98.1 F (36.7 C)     Temp src --      SpO2 03/06/24 1728 95 %  Weight --      Height --      Head Circumference --      Peak Flow --      Pain Score 03/06/24 1730 0     Pain Loc --      Pain Education --      Exclude from Growth  Chart --    No data found.  Updated Vital Signs BP 115/72 (BP Location: Right Arm)   Pulse 62   Temp 98.1 F (36.7 C)   Resp 20   SpO2 95%   Visual Acuity Right Eye Distance:   Left Eye Distance:   Bilateral Distance:    Right Eye Near:   Left Eye Near:    Bilateral Near:     Physical Exam Vitals and nursing note reviewed.  Constitutional:      Appearance: Normal appearance.  HENT:     Head: Normocephalic and atraumatic.     Right Ear: External ear normal.     Left Ear: External ear normal.     Nose: Nose normal.     Mouth/Throat:     Mouth: Mucous membranes are moist.  Eyes:     Conjunctiva/sclera: Conjunctivae normal.  Cardiovascular:     Rate and Rhythm: Normal rate.  Pulmonary:     Effort: Pulmonary effort is normal. No respiratory distress.  Musculoskeletal:        General: Normal range of motion.  Skin:    General: Skin is warm and dry.     Findings: Laceration present.       Neurological:     Mental Status: She is alert.       Media Information    Media Information  Document Information     UC Treatments / Results  Labs (all labs ordered are listed, but only abnormal results are displayed) Labs Reviewed - No data to display  EKG   Radiology No results found.  Procedures Laceration Repair  Date/Time: 03/06/2024 6:33 PM  Performed by: Harlow Lighter, Reagan Behlke  N, FNP Authorized by: Lessie Ratel, FNP   Consent:    Consent obtained:  Verbal   Consent given by:  Patient   Risks discussed:  Infection, need for additional repair, pain, poor cosmetic result and poor wound healing   Alternatives discussed:  Delayed treatment and no treatment Universal protocol:    Procedure explained and questions answered to patient or proxy's satisfaction: yes     Immediately prior to procedure, a time out was called: yes     Patient identity confirmed:  Verbally with patient Anesthesia:    Anesthesia method:  Local infiltration   Local  anesthetic:  Lidocaine  1% WITH epi Laceration details:    Location:  Leg   Leg location:  R upper leg   Length (cm):  4   Depth (mm):  10 Pre-procedure details:    Preparation:  Patient was prepped and draped in usual sterile fashion Exploration:    Limited defect created (wound extended): yes     Hemostasis achieved with:  Direct pressure   Imaging outcome: foreign body not noted     Wound exploration: wound explored through full range of motion     Wound extent: areolar tissue violated   Treatment:    Area cleansed with:  Saline and soap and water    Amount of cleaning:  Extensive   Irrigation solution:  Sterile water    Irrigation volume:  200   Irrigation method:  Syringe and tap   Debridement:  Minimal   Undermining:  Minimal Skin repair:    Repair method:  Sutures   Suture size:  4-0   Suture material:  Prolene   Suture technique:  Simple interrupted and horizontal mattress   Number of sutures:  9 Approximation:    Approximation:  Close Repair type:    Repair type:  Complex Post-procedure details:    Dressing:  Non-adherent dressing   Procedure completion:  Tolerated well, no immediate complications  (including critical care time)  Medications Ordered in UC Medications - No data to display  Initial Impression / Assessment and Plan / UC Course  I have reviewed the triage vital signs and the nursing notes.  Pertinent labs & imaging results that were available during my care of the patient were reviewed by me and considered in my medical decision making (see chart for details).  Vitals in triage reviewed, patient is hemodynamically stable.  Bleeding controlled in clinic.  Tdap updated last year.  Area cleaned, numbed and irrigated.  Minimal debridement to fatty tissue.  Horizontal mattress to anchor flap and then 8 simple interrupted sutures placed.  Edges well-approximated.  Tolerated well.  Will place on Keflex  to prevent bacterial infection.  Wound care follow-up  early in July.  Return to clinic for suture removal.  Plan of care, follow-up care return precautions given, no questions at this time.     Final Clinical Impressions(s) / UC Diagnoses   Final diagnoses:  Laceration of right lower extremity, initial encounter     Discharge Instructions      Today we placed 9 sutures in your right upper leg.  Take the Keflex  as prescribed to help prevent bacterial infection.  Keep the sutures clean and dry for the next 24 hours.  Afterwards you can clean gently with warm water  and antibacterial solution such as Dial and apply topical Neosporin.  Sutures should be removed in 8 to 10 days.    Return to clinic sooner if you develop any signs or symptoms of infection such as warmth, purulent drainage, pain, redness, or new concerning symptoms.  ED Prescriptions     Medication Sig Dispense Auth. Provider   cephALEXin  (KEFLEX ) 500 MG capsule Take 1 capsule (500 mg total) by mouth 2 (two) times daily for 7 days. 14 capsule Harlow Lighter, Lacrisha Bielicki  N, FNP      PDMP not reviewed this encounter.   Harlow Lighter, Kip Cropp  N, FNP 03/06/24 [id number]

## 2024-03-13 ENCOUNTER — Ambulatory Visit (HOSPITAL_COMMUNITY)
Admission: EM | Admit: 2024-03-13 | Discharge: 2024-03-13 | Disposition: A | Discharge status: Home / Self Care | Attending: Emergency Medicine | Admitting: Emergency Medicine

## 2024-03-13 ENCOUNTER — Encounter (HOSPITAL_COMMUNITY): Payer: Self-pay

## 2024-03-13 VITALS — BP 127/59 | HR 78 | Temp 97.9°F | Resp 16

## 2024-03-13 DIAGNOSIS — S71111D Laceration without foreign body, right thigh, subsequent encounter: Secondary | ICD-10-CM | POA: Diagnosis not present

## 2024-03-13 DIAGNOSIS — Z5189 Encounter for other specified aftercare: Secondary | ICD-10-CM

## 2024-03-13 DIAGNOSIS — Z4802 Encounter for removal of sutures: Secondary | ICD-10-CM | POA: Diagnosis not present

## 2024-03-13 MED ORDER — MUPIROCIN 2 % EX OINT: 1.0000 | TOPICAL_OINTMENT | Freq: Two times a day (BID) | CUTANEOUS | 0 refills | Status: AC

## 2024-03-13 NOTE — ED Provider Notes (Signed)
 [facility]    CSN: [id number] Arrival date & time: 03/13/24  1151      History   Chief Complaint Chief Complaint  Patient presents with   Suture / Staple Removal    HPI [name] is a 78 y.o. female.  Here for suture removal and wound check Laceration to the right lower thigh 7 days ago. Seen at this urgent care, 9 sutures were placed. Patient was also given keflex  BID x 7 days. Area has been washed daily, patient reports a little bleeding but no discharge, worsening pain or redness. Applying an antibiotic ointment  RN requested this provider evaluate healing before sutures removed  Past Medical History:  Diagnosis Date   Allergic rhinitis, cause unspecified    Allergy     SEASONAL   Anemia    Cataract    Bilateral   CHF (congestive heart failure) (HCC)    Chronic headaches    Constipation    Diverticulosis    DJD (degenerative joint disease), lumbar    GERD (gastroesophageal reflux disease)    Hemorrhoids    Hiatal hernia    HTN (hypertension)    Hyperlipidemia    Obesity    Osteoarthritis    Tubular adenoma of colon     Patient Active Problem List   Diagnosis Date Noted   Left otitis externa 10/05/2023   Generalized weakness 07/08/2023   Diabetes (HCC) 07/05/2023   HLD (hyperlipidemia) 07/05/2023   Cerumen impaction 12/21/2022   Conductive hearing loss, bilateral 12/21/2022   Cough productive of yellow sputum 08/18/2022   LRTI (lower respiratory tract infection) 08/18/2022   Allergic rhinitis 01/24/2022   COVID-19 virus infection 11/23/2021   CKD (chronic kidney disease) stage 3, GFR 30-59 ml/min (HCC) 07/14/2021   Vitamin D  deficiency 07/14/2021   Pain and swelling of lower leg, left 05/15/2021   Cellulitis of lower leg 01/14/2021   Sepsis due to cellulitis (HCC) 08/13/2020   Cellulitis of right lower extremity 08/13/2020   Injury of back due to fall 08/13/2020   Microcytic anemia 08/13/2020   Lactic acidosis 08/13/2020    Sepsis (HCC) 08/13/2020   Aortic atherosclerosis (HCC) 08/07/2020   Itching 08/07/2020   Leg skin lesion, left 08/07/2020   Cellulitis of left leg 01/11/2020   Pulmonary embolism (HCC) 12/28/2019   Leg wound, left 11/12/2019   Insomnia 09/23/2019   Peripheral edema 07/20/2019   Primary osteoarthritis of right knee 04/14/2019   Acute bursitis of right shoulder 08/16/2017   Abnormal CXR 07/26/2017   Unexplained night sweats 07/26/2017   Greater trochanteric bursitis of right hip 06/23/2017   Sweating abnormality 03/09/2017   Gingivitis 03/09/2017   Herpes zoster without complication 02/10/2017   Trapezoid ligament sprain, right, initial encounter 12/11/2016   Fasciculation 12/11/2016   Acute bronchitis 11/16/2016   Degenerative joint disease of left shoulder 10/07/2016   Degenerative arthritis of knee, bilateral 09/16/2016   Lumbar stenosis with neurogenic claudication 06/17/2016   Compression fracture of L1 lumbar vertebra (HCC) 03/31/2016   Pelvic mass in female 03/31/2016   Urinary frequency 02/27/2016   Chronic maxillary sinusitis 06/06/2015   Carpal tunnel syndrome 09/18/2014   Sprain of ankle 07/04/2014   Primary localized osteoarthrosis, lower leg 05/21/2014   Left knee pain 03/15/2014   Food allergy  10/05/2013   Eustachian tube dysfunction, left 04/24/2013   Degenerative arthritis of left knee 04/24/2013   Hot flashes 04/24/2013   External otitis of left ear 12/15/2012   Spinal stenosis of lumbar region 04/25/2012  Seasonal and perennial allergic rhinitis 03/18/2012   Hearing loss in right ear 03/18/2012   Esophageal dysphagia 02/11/2012   Dysphagia 01/20/2012   Rash 01/20/2012   Bilateral knee pain 01/20/2012   Encounter for well adult exam with abnormal findings 01/14/2012   GERD (gastroesophageal reflux disease) 01/14/2012   Essential (primary) hypertension 01/14/2012   Mixed hyperlipidemia 01/14/2012   Body mass index (BMI) 39.0-39.9, adult 01/14/2012    Osteoarthritis 01/14/2012   DJD (degenerative joint disease), lumbar 01/14/2012   Palpitations 11/21/2010   Dyspnea on exertion 11/21/2010    Past Surgical History:  Procedure Laterality Date   ABLATION     uterus   COLONOSCOPY  05/15/2021   [facility]rtle   TOTAL KNEE ARTHROPLASTY Right 04/14/2019   Procedure: TOTAL KNEE ARTHROPLASTY;  Surgeon: Neil Balls, MD;  Location: [facility];  Service: Orthopedics;  Laterality: Right;   TUBAL LIGATION      OB History   No obstetric history on file.      Home Medications    Prior to Admission medications   Medication Sig Start Date End Date Taking? Authorizing Provider  mupirocin  ointment (BACTROBAN ) 2 % Apply 1 Application topically 2 (two) times daily. 03/13/24  Yes Letroy Vazguez, Ivette Marks, PA-C  aspirin -acetaminophen -caffeine  (EXCEDRIN  MIGRAINE) 250-250-65 MG tablet Take 1 tablet by mouth every 6 (six) hours as needed for headache. 02/18/24   Roslyn Coombe, MD  atorvastatin  (LIPITOR) 40 MG tablet Take 1 tablet (40 mg total) by mouth daily. 02/18/24   Roslyn Coombe, MD  azelastine  (ASTELIN ) 0.1 % nasal spray Place 1 spray into both nostrils 2 (two) times daily. Use in each nostril as directed 02/18/24   Roslyn Coombe, MD  azelastine  (OPTIVAR ) 0.05 % ophthalmic solution Place 1 drop into both eyes 2 (two) times daily. 02/18/24   Roslyn Coombe, MD  betamethasone , augmented, (DIPROLENE ) 0.05 % lotion Apply topically daily as needed. 02/18/24   Roslyn Coombe, MD  cephALEXin  (KEFLEX ) 500 MG capsule Take 1 capsule (500 mg total) by mouth 2 (two) times daily for 7 days. 03/06/24 03/13/24  Harlow Lighter, [place]  N, FNP  cetirizine  (ZYRTEC ) 10 MG tablet Take 1 tablet (10 mg total) by mouth daily as needed for allergies. As needed 02/18/24   Roslyn Coombe, MD  Cholecalciferol  50 MCG (2000 UT) TABS 1 tab by mouth once daily 02/18/24   Roslyn Coombe, MD  cyanocobalamin  (VITAMIN B12) 1000 MCG tablet Take 1 tablet (1,000 mcg total) by mouth daily. 02/18/24   Roslyn Coombe, MD  diltiazem   (DILACOR XR ) 240 MG 24 hr capsule Take 1 capsule (240 mg total) by mouth daily. 02/18/24   Roslyn Coombe, MD  DULoxetine  (CYMBALTA ) 20 MG capsule TAKE 2 CAPSULES EVERY DAY 02/22/24   Roslyn Coombe, MD  ferrous sulfate  325 (65 FE) MG tablet Take 1 tablet (325 mg total) by mouth 2 (two) times daily with a meal. 02/18/24   Roslyn Coombe, MD  furosemide  (LASIX ) 80 MG tablet Take 1 tablet (80 mg total) by mouth 2 (two) times daily. 02/22/24   Roslyn Coombe, MD  gabapentin  (NEURONTIN ) 100 MG capsule Take 2 capsules (200 mg total) by mouth at bedtime. 02/18/24   Roslyn Coombe, MD  HYDROcodone -acetaminophen  (NORCO/VICODIN) 5-325 MG tablet Take 0.5 tablets by mouth daily as needed. 06/24/21   [provider]  hydrocortisone  2.5 % lotion Apply topically daily. 02/18/24   Roslyn Coombe, MD  hydrOXYzine  (ATARAX ) 10 MG tablet TAKE 1 TABLET(10 MG) BY  MOUTH AT BEDTIME AS NEEDED FOR ITCHING 11/10/23   Roslyn Coombe, MD  ketoconazole  (NIZORAL ) 2 % shampoo Apply topically once a week. 02/18/24   Roslyn Coombe, MD  losartan  (COZAAR ) 100 MG tablet Take 1 tablet (100 mg total) by mouth daily. 02/18/24   Roslyn Coombe, MD  metoprolol  tartrate (LOPRESSOR ) 50 MG tablet Take 1 tablet (50 mg total) by mouth 2 (two) times daily. 02/22/24   Roslyn Coombe, MD  ondansetron  (ZOFRAN ) 4 MG tablet Take 1 tablet (4 mg total) by mouth every 8 (eight) hours as needed for nausea or vomiting. 02/18/24   Roslyn Coombe, MD  pantoprazole  (PROTONIX ) 40 MG tablet Take 1 tablet (40 mg total) by mouth daily. 02/18/24   Roslyn Coombe, MD  pantoprazole  (PROTONIX ) 40 MG tablet TAKE 1 TABLET(40 MG) BY MOUTH DAILY 02/18/24   Roslyn Coombe, MD  Polyethyl Glycol-Propyl Glycol (SYSTANE OP) Place 1 drop into both eyes 3 (three) times daily as needed (dry eyes).    [provider]  polyethylene glycol powder (MIRALAX ) 17 GM/SCOOP powder Take 17 g by mouth daily as needed (constipation). 02/18/24   Roslyn Coombe, MD  potassium chloride  (KLOR-CON  M) 10 MEQ tablet Take  1 tablet (10 mEq total) by mouth daily. 02/18/24   Roslyn Coombe, MD  promethazine -dextromethorphan (PROMETHAZINE -DM) 6.25-15 MG/5ML syrup Take 5 mLs by mouth 4 (four) times daily as needed for cough. 08/18/22   Arcadio Knuckles, MD  tiZANidine  (ZANAFLEX ) 2 MG tablet Take 1 tablet (2 mg total) by mouth every 8 (eight) hours as needed for muscle spasms. 02/18/24   Roslyn Coombe, MD  traMADol  (ULTRAM ) 50 MG tablet Take 1-2 tablets (50-100 mg total) by mouth every 6 (six) hours as needed. 02/18/24   Roslyn Coombe, MD  triamcinolone  (NASACORT ) 55 MCG/ACT AERO nasal inhaler Place 2 sprays into the nose daily. 02/18/24   Roslyn Coombe, MD  triamcinolone  cream (KENALOG ) 0.1 % Apply 1 Application topically 2 (two) times daily. 02/18/24   Roslyn Coombe, MD    Family History Family History  Problem Relation Age of Onset   Heart disease Mother    Hypertension Mother    Breast cancer Sister    Diabetes Brother    Colon cancer Neg Hx    Esophageal cancer Neg Hx    Rectal cancer Neg Hx    Stomach cancer Neg Hx    Colon polyps Neg Hx     Social History Social History   Tobacco Use   Smoking status: Never    Passive exposure: Never   Smokeless tobacco: Never  Vaping Use   Vaping status: Never Used  Substance Use Topics   Alcohol  use: No    Alcohol /week: 0.0 standard drinks of alcohol    Drug use: No     Allergies   Doxycycline , Oxycodone , Soybean-containing drug products, and Sulfonamide derivatives   Review of Systems Review of Systems  As per HPI  Physical Exam Triage Vital Signs ED Triage Vitals  Encounter Vitals Group     BP 03/13/24 1207 (!) 127/59     Systolic BP Percentile --      Diastolic BP Percentile --      Pulse Rate 03/13/24 1207 78     Resp 03/13/24 1207 16     Temp 03/13/24 1207 97.9 F (36.6 C)     Temp Source 03/13/24 1207 Oral     SpO2 03/13/24 1207 95 %  Weight --      Height --      Head Circumference --      Peak Flow --      Pain Score 03/13/24 1206 0      Pain Loc --      Pain Education --      Exclude from Growth Chart --    No data found.  Updated Vital Signs BP (!) 127/59 (BP Location: Left Arm)   Pulse 78   Temp 97.9 F (36.6 C) (Oral)   Resp 16   SpO2 95%   Visual Acuity Right Eye Distance:   Left Eye Distance:   Bilateral Distance:    Right Eye Near:   Left Eye Near:    Bilateral Near:     Physical Exam Vitals and nursing note reviewed.  Constitutional:      General: She is not in acute distress.    Appearance: Normal appearance.  Cardiovascular:     Rate and Rhythm: Normal rate and regular rhythm.     Heart sounds: Normal heart sounds.  Pulmonary:     Effort: Pulmonary effort is normal.     Breath sounds: Normal breath sounds.  Skin:    Findings: Laceration present.     Comments: Healing laceration on the right lower thigh. There are 9 sutures intact. Slight bruising is noted around the area. Non tender, no erythema, drainage, or swelling  Neurological:     Mental Status: She is alert and oriented to person, place, and time.     UC Treatments / Results  Labs (all labs ordered are listed, but only abnormal results are displayed) Labs Reviewed - No data to display  EKG  Radiology No results found.  Procedures Procedures (including critical care time)  Medications Ordered in UC Medications - No data to display  Initial Impression / Assessment and Plan / UC Course  I have reviewed the triage vital signs and the nursing notes.  Pertinent labs & imaging results that were available during my care of the patient were reviewed by me and considered in my medical decision making (see chart for details).  Healing well Sutures removed by Katheran Palms RN Discussed importance of wound care, keeping clean and dry, monitoring for any new changes. Can use bactroban  ointment BID for the next several days. Patient agrees to plan, no questions  Final Clinical Impressions(s) / UC Diagnoses   Final diagnoses:   Laceration of right thigh, subsequent encounter  Visit for suture removal  Visit for wound check     Discharge Instructions      Keep area clean - wash at least twice daily with mild soap and water . You can apply the bactroban  ointment twice daily after washing.  Minimal bleeding is expected after suture removal However if there is increased pain, redness, swelling, or foul drainage from the area, please return.   ED Prescriptions     Medication Sig Dispense Auth. Provider   mupirocin  ointment (BACTROBAN ) 2 % Apply 1 Application topically 2 (two) times daily. 15 g Murdis Flitton, Ivette Marks, PA-C      PDMP not reviewed this encounter.   Lashanta Elbe, Ivette Marks, [place] 03/13/24 1222

## 2024-03-13 NOTE — Discharge Instructions (Addendum)
 Keep area clean - wash at least twice daily with mild soap and water . You can apply the bactroban  ointment twice daily after washing.  Minimal bleeding is expected after suture removal However if there is increased pain, redness, swelling, or foul drainage from the area, please return.

## 2024-03-13 NOTE — ED Triage Notes (Signed)
 Pt here for suture removal to right thigh. Sutures intact no redness or swelling at site.

## 2024-03-15 NOTE — Progress Notes (Deleted)
 [facility] [address] [place] [address] Phone: [phone] Subjective:    I'm seeing this patient by the request  of:  Roslyn Coombe, MD  CC:   [id number]  Shawndrea Rutkowski is a 78 y.o. female coming in with complaint of R hand pain. Tripped and fell on her walker and was seen in UC for laceration. Stitches out on 03/13/2024.     Past Medical History:  Diagnosis Date   Allergic rhinitis, cause unspecified    Allergy     SEASONAL   Anemia    Cataract    Bilateral   CHF (congestive heart failure) (HCC)    Chronic headaches    Constipation    Diverticulosis    DJD (degenerative joint disease), lumbar    GERD (gastroesophageal reflux disease)    Hemorrhoids    Hiatal hernia    HTN (hypertension)    Hyperlipidemia    Obesity    Osteoarthritis    Tubular adenoma of colon    Past Surgical History:  Procedure Laterality Date   ABLATION     uterus   COLONOSCOPY  05/15/2021   Pyrtle   TOTAL KNEE ARTHROPLASTY Right 04/14/2019   Procedure: TOTAL KNEE ARTHROPLASTY;  Surgeon: Neil Balls, MD;  Location: [facility];  Service: Orthopedics;  Laterality: Right;   TUBAL LIGATION     Social History   Socioeconomic History   Marital status: Widowed    Spouse name: Not on file   Number of children: 3   Years of education: 9   Highest education level: Not on file  Occupational History   Occupation: retired    Associate Professor: UNEMPLOYED  Tobacco Use   Smoking status: Never    Passive exposure: Never   Smokeless tobacco: Never  Vaping Use   Vaping status: Never Used  Substance and Sexual Activity   Alcohol  use: No    Alcohol /week: 0.0 standard drinks of alcohol    Drug use: No   Sexual activity: Not Currently    Birth control/protection: Post-menopausal  Other Topics Concern   Not on file  Social History Narrative   Son lives with her-2025   Social Drivers of Health   Financial Resource Strain: Low Risk  (11/03/2023)   Overall  Financial Resource Strain (CARDIA)    Difficulty of Paying Living Expenses: Not hard at all  Food Insecurity: No Food Insecurity (11/03/2023)   Hunger Vital Sign    Worried About Running Out of Food in the Last Year: Never true    Ran Out of Food in the Last Year: Never true  Transportation Needs: No Transportation Needs (10/23/2022)   PRAPARE - Administrator, Civil Service (Medical): No    Lack of Transportation (Non-Medical): No  Physical Activity: Insufficiently Active (11/03/2023)   Exercise Vital Sign    Days of Exercise per Week: 1 day    Minutes of Exercise per Session: 60 min  Stress: No Stress Concern Present (11/03/2023)   [employer] of Occupational Health - Occupational Stress Questionnaire    Feeling of Stress : Not at all  Social Connections: Moderately Integrated (11/03/2023)   Social Connection and Isolation Panel [NHANES]    Frequency of Communication with Friends and Family: More than three times a week    Frequency of Social Gatherings with Friends and Family: Never    Attends Religious Services: More than 4 times per year    Active Member of Clubs or Organizations: Yes  Attends Banker Meetings: Never    Marital Status: Widowed   Allergies  Allergen Reactions   Doxycycline  Itching    Rash, itching   Oxycodone     Soybean-Containing Drug Products Other (See Comments)    Allergy  testing positive for soy beans but pt uses soy sauce   Sulfonamide Derivatives Itching and Rash   Family History  Problem Relation Age of Onset   Heart disease Mother    Hypertension Mother    Breast cancer Sister    Diabetes Brother    Colon cancer Neg Hx    Esophageal cancer Neg Hx    Rectal cancer Neg Hx    Stomach cancer Neg Hx    Colon polyps Neg Hx      Current Outpatient Medications (Cardiovascular):    atorvastatin  (LIPITOR) 40 MG tablet, Take 1 tablet (40 mg total) by mouth daily.   diltiazem  (DILACOR XR ) 240 MG 24 hr capsule, Take 1  capsule (240 mg total) by mouth daily.   furosemide  (LASIX ) 80 MG tablet, Take 1 tablet (80 mg total) by mouth 2 (two) times daily.   losartan  (COZAAR ) 100 MG tablet, Take 1 tablet (100 mg total) by mouth daily.   metoprolol  tartrate (LOPRESSOR ) 50 MG tablet, Take 1 tablet (50 mg total) by mouth 2 (two) times daily.  Current Outpatient Medications (Respiratory):    azelastine  (ASTELIN ) 0.1 % nasal spray, Place 1 spray into both nostrils 2 (two) times daily. Use in each nostril as directed   cetirizine  (ZYRTEC ) 10 MG tablet, Take 1 tablet (10 mg total) by mouth daily as needed for allergies. As needed   promethazine -dextromethorphan (PROMETHAZINE -DM) 6.25-15 MG/5ML syrup, Take 5 mLs by mouth 4 (four) times daily as needed for cough.   triamcinolone  (NASACORT ) 55 MCG/ACT AERO nasal inhaler, Place 2 sprays into the nose daily.  Current Outpatient Medications (Analgesics):    aspirin -acetaminophen -caffeine  (EXCEDRIN  MIGRAINE) 250-250-65 MG tablet, Take 1 tablet by mouth every 6 (six) hours as needed for headache.   HYDROcodone -acetaminophen  (NORCO/VICODIN) 5-325 MG tablet, Take 0.5 tablets by mouth daily as needed.   traMADol  (ULTRAM ) 50 MG tablet, Take 1-2 tablets (50-100 mg total) by mouth every 6 (six) hours as needed.  Current Outpatient Medications (Hematological):    cyanocobalamin  (VITAMIN B12) 1000 MCG tablet, Take 1 tablet (1,000 mcg total) by mouth daily.   ferrous sulfate  325 (65 FE) MG tablet, Take 1 tablet (325 mg total) by mouth 2 (two) times daily with a meal.  Current Outpatient Medications (Other):    azelastine  (OPTIVAR ) 0.05 % ophthalmic solution, Place 1 drop into both eyes 2 (two) times daily.   betamethasone , augmented, (DIPROLENE ) 0.05 % lotion, Apply topically daily as needed.   Cholecalciferol  50 MCG (2000 UT) TABS, 1 tab by mouth once daily   DULoxetine  (CYMBALTA ) 20 MG capsule, TAKE 2 CAPSULES EVERY DAY   gabapentin  (NEURONTIN ) 100 MG capsule, Take 2 capsules (200 mg  total) by mouth at bedtime.   hydrocortisone  2.5 % lotion, Apply topically daily.   hydrOXYzine  (ATARAX ) 10 MG tablet, TAKE 1 TABLET(10 MG) BY MOUTH AT BEDTIME AS NEEDED FOR ITCHING   ketoconazole  (NIZORAL ) 2 % shampoo, Apply topically once a week.   mupirocin  ointment (BACTROBAN ) 2 %, Apply 1 Application topically 2 (two) times daily.   ondansetron  (ZOFRAN ) 4 MG tablet, Take 1 tablet (4 mg total) by mouth every 8 (eight) hours as needed for nausea or vomiting.   pantoprazole  (PROTONIX ) 40 MG tablet, Take 1 tablet (40 mg total) by  mouth daily.   pantoprazole  (PROTONIX ) 40 MG tablet, TAKE 1 TABLET(40 MG) BY MOUTH DAILY   Polyethyl Glycol-Propyl Glycol (SYSTANE OP), Place 1 drop into both eyes 3 (three) times daily as needed (dry eyes).   polyethylene glycol powder (MIRALAX ) 17 GM/SCOOP powder, Take 17 g by mouth daily as needed (constipation).   potassium chloride  (KLOR-CON  M) 10 MEQ tablet, Take 1 tablet (10 mEq total) by mouth daily.   tiZANidine  (ZANAFLEX ) 2 MG tablet, Take 1 tablet (2 mg total) by mouth every 8 (eight) hours as needed for muscle spasms.   triamcinolone  cream (KENALOG ) 0.1 %, Apply 1 Application topically 2 (two) times daily.   Reviewed prior external information including notes and imaging from  primary care provider As well as notes that were available from care everywhere and other healthcare systems.  Past medical history, social, surgical and family history all reviewed in electronic medical record.  No pertanent information unless stated regarding to the chief complaint.   Review of Systems:  No headache, visual changes, nausea, vomiting, diarrhea, constipation, dizziness, abdominal pain, skin rash, fevers, chills, night sweats, weight loss, swollen lymph nodes, body aches, joint swelling, chest pain, shortness of breath, mood changes. POSITIVE muscle aches  Objective  There were no vitals taken for this visit.   General: No apparent distress alert and oriented x3  mood and affect normal, dressed appropriately.  HEENT: Pupils equal, extraocular movements intact  Respiratory: Patient's speak in full sentences and does not appear short of breath  Cardiovascular: No lower extremity edema, non tender, no erythema      Impression and Recommendations:

## 2024-03-16 ENCOUNTER — Ambulatory Visit: Admitting: Family Medicine

## 2024-03-20 ENCOUNTER — Other Ambulatory Visit: Payer: Self-pay | Admitting: Internal Medicine

## 2024-03-27 DIAGNOSIS — M48062 Spinal stenosis, lumbar region with neurogenic claudication: Secondary | ICD-10-CM | POA: Diagnosis not present

## 2024-03-27 DIAGNOSIS — F1120 Opioid dependence, uncomplicated: Secondary | ICD-10-CM | POA: Diagnosis not present

## 2024-04-10 ENCOUNTER — Other Ambulatory Visit: Payer: Self-pay | Admitting: Internal Medicine

## 2024-04-18 NOTE — Progress Notes (Unsigned)
 [name] [address] [place] [address] Phone: [phone] Subjective:   [name], am serving as a scribe for Dr. Arthea Claudene.  I'm seeing this patient by the request  of:  Norleen Lynwood ORN, MD  CC: Right hand pain  YEP:Dlagzrupcz  [name] is a 78 y.o. female coming in with complaint of R hand pain. Seen 2 years ago for shoulder pain. Patient states has been having hand pain for a while. Tingle and stingy in nature usual happens at rest. Sometimes tips of fingers will go numb. Wants to know what she can do about cellulitis.      Past Medical History:  Diagnosis Date   Allergic rhinitis, cause unspecified    Allergy     SEASONAL   Anemia    Cataract    Bilateral   CHF (congestive heart failure) (HCC)    Chronic headaches    Constipation    Diverticulosis    DJD (degenerative joint disease), lumbar    GERD (gastroesophageal reflux disease)    Hemorrhoids    Hiatal hernia    HTN (hypertension)    Hyperlipidemia    Obesity    Osteoarthritis    Tubular adenoma of colon    Past Surgical History:  Procedure Laterality Date   ABLATION     uterus   COLONOSCOPY  05/15/2021   Pyrtle   TOTAL KNEE ARTHROPLASTY Right 04/14/2019   Procedure: TOTAL KNEE ARTHROPLASTY;  Surgeon: Yvone Norleen, MD;  Location: [facility];  Service: Orthopedics;  Laterality: Right;   TUBAL LIGATION     Social History   Socioeconomic History   Marital status: Widowed    Spouse name: Not on file   Number of children: 3   Years of education: 88   Highest education level: Not on file  Occupational History   Occupation: retired    Associate Professor: UNEMPLOYED  Tobacco Use   Smoking status: Never    Passive exposure: Never   Smokeless tobacco: Never  Vaping Use   Vaping status: Never Used  Substance and Sexual Activity   Alcohol  use: No    Alcohol /week: 0.0 standard drinks of alcohol    Drug use: No   Sexual activity: Not Currently    Birth  control/protection: Post-menopausal  Other Topics Concern   Not on file  Social History Narrative   Son lives with her-2025   Social Drivers of Health   Financial Resource Strain: Low Risk  (11/03/2023)   Overall Financial Resource Strain (CARDIA)    Difficulty of Paying Living Expenses: Not hard at all  Food Insecurity: No Food Insecurity (11/03/2023)   Hunger Vital Sign    Worried About Running Out of Food in the Last Year: Never true    Ran Out of Food in the Last Year: Never true  Transportation Needs: No Transportation Needs (10/23/2022)   PRAPARE - Administrator, Civil Service (Medical): No    Lack of Transportation (Non-Medical): No  Physical Activity: Insufficiently Active (11/03/2023)   Exercise Vital Sign    Days of Exercise per Week: 1 day    Minutes of Exercise per Session: 60 min  Stress: No Stress Concern Present (11/03/2023)   [employer] of Occupational Health - Occupational Stress Questionnaire    Feeling of Stress : Not at all  Social Connections: Moderately Integrated (11/03/2023)   Social Connection and Isolation Panel    Frequency of Communication with Friends and Family: More than three times  a week    Frequency of Social Gatherings with Friends and Family: Never    Attends Religious Services: More than 4 times per year    Active Member of [employer] or Organizations: Yes    Attends Banker Meetings: Never    Marital Status: Widowed   Allergies  Allergen Reactions   Doxycycline  Itching    Rash, itching   Oxycodone     Soybean-Containing Drug Products Other (See Comments)    Allergy  testing positive for soy beans but pt uses soy sauce   Sulfonamide Derivatives Itching and Rash   Family History  Problem Relation Age of Onset   Heart disease Mother    Hypertension Mother    Breast cancer Sister    Diabetes Brother    Colon cancer Neg Hx    Esophageal cancer Neg Hx    Rectal cancer Neg Hx    Stomach cancer Neg Hx    Colon  polyps Neg Hx      Current Outpatient Medications (Cardiovascular):    atorvastatin  (LIPITOR) 40 MG tablet, Take 1 tablet (40 mg total) by mouth daily.   diltiazem  (DILACOR XR ) 240 MG 24 hr capsule, Take 1 capsule (240 mg total) by mouth daily.   furosemide  (LASIX ) 80 MG tablet, Take 1 tablet (80 mg total) by mouth 2 (two) times daily.   losartan  (COZAAR ) 100 MG tablet, TAKE 1 TABLET(100 MG) BY MOUTH DAILY   metoprolol  tartrate (LOPRESSOR ) 50 MG tablet, Take 1 tablet (50 mg total) by mouth 2 (two) times daily.  Current Outpatient Medications (Respiratory):    azelastine  (ASTELIN ) 0.1 % nasal spray, Place 1 spray into both nostrils 2 (two) times daily. Use in each nostril as directed   cetirizine  (ZYRTEC ) 10 MG tablet, Take 1 tablet (10 mg total) by mouth daily as needed for allergies. As needed   promethazine -dextromethorphan (PROMETHAZINE -DM) 6.25-15 MG/5ML syrup, Take 5 mLs by mouth 4 (four) times daily as needed for cough.   triamcinolone  (NASACORT ) 55 MCG/ACT AERO nasal inhaler, Place 2 sprays into the nose daily.  Current Outpatient Medications (Analgesics):    aspirin -acetaminophen -caffeine  (EXCEDRIN  MIGRAINE) 250-250-65 MG tablet, Take 1 tablet by mouth every 6 (six) hours as needed for headache.   HYDROcodone -acetaminophen  (NORCO/VICODIN) 5-325 MG tablet, Take 0.5 tablets by mouth daily as needed.   traMADol  (ULTRAM ) 50 MG tablet, Take 1-2 tablets (50-100 mg total) by mouth every 6 (six) hours as needed.  Current Outpatient Medications (Hematological):    cyanocobalamin  (VITAMIN B12) 1000 MCG tablet, Take 1 tablet (1,000 mcg total) by mouth daily.   ferrous sulfate  325 (65 FE) MG tablet, Take 1 tablet (325 mg total) by mouth 2 (two) times daily with a meal.  Current Outpatient Medications (Other):    azelastine  (OPTIVAR ) 0.05 % ophthalmic solution, Place 1 drop into both eyes 2 (two) times daily.   betamethasone , augmented, (DIPROLENE ) 0.05 % lotion, Apply topically daily as  needed.   Cholecalciferol  50 MCG (2000 UT) TABS, 1 tab by mouth once daily   DULoxetine  (CYMBALTA ) 20 MG capsule, TAKE 2 CAPSULES EVERY DAY   gabapentin  (NEURONTIN ) 100 MG capsule, Take 2 capsules (200 mg total) by mouth at bedtime.   hydrocortisone  2.5 % lotion, Apply topically daily.   hydrOXYzine  (ATARAX ) 10 MG tablet, TAKE 1 TABLET(10 MG) BY MOUTH AT BEDTIME AS NEEDED FOR ITCHING   ketoconazole  (NIZORAL ) 2 % shampoo, Apply topically once a week.   mupirocin  ointment (BACTROBAN ) 2 %, Apply 1 Application topically 2 (two) times daily.  ondansetron  (ZOFRAN ) 4 MG tablet, Take 1 tablet (4 mg total) by mouth every 8 (eight) hours as needed for nausea or vomiting.   pantoprazole  (PROTONIX ) 40 MG tablet, Take 1 tablet (40 mg total) by mouth daily.   pantoprazole  (PROTONIX ) 40 MG tablet, TAKE 1 TABLET(40 MG) BY MOUTH DAILY   Polyethyl Glycol-Propyl Glycol (SYSTANE OP), Place 1 drop into both eyes 3 (three) times daily as needed (dry eyes).   polyethylene glycol powder (MIRALAX ) 17 GM/SCOOP powder, Take 17 g by mouth daily as needed (constipation).   potassium chloride  (KLOR-CON  M) 10 MEQ tablet, Take 1 tablet (10 mEq total) by mouth daily.   tiZANidine  (ZANAFLEX ) 2 MG tablet, Take 1 tablet (2 mg total) by mouth every 8 (eight) hours as needed for muscle spasms.   triamcinolone  cream (KENALOG ) 0.1 %, Apply 1 Application topically 2 (two) times daily.   Reviewed prior external information including notes and imaging from  primary care provider As well as notes that were available from care everywhere and other healthcare systems.  Past medical history, social, surgical and family history all reviewed in electronic medical record.  No pertanent information unless stated regarding to the chief complaint.   Review of Systems:  No headache, visual changes, nausea, vomiting, diarrhea, constipation, dizziness, abdominal pain, skin rash, fevers, chills, night sweats, weight loss, swollen lymph nodes,  body aches, joint swelling, chest pain, shortness of breath, mood changes. POSITIVE muscle aches  Objective  There were no vitals taken for this visit.   General: No apparent distress alert and oriented x3 mood and affect normal, dressed appropriately.  HEENT: Pupils equal, extraocular movements intact  Respiratory: Patient's speak in full sentences and does not appear short of breath  Cardiovascular: Pitting edema noted with redness of the skin with mild warmness.  No skin breakdown on the right side of the lower extremity. Right hand exam shows patient does have mild thenar eminence wasting and does have some arthritic changes.  Significantly positive Tinel's noted.  Limited muscular skeletal ultrasound was performed and interpreted by CLAUDENE HUSSAR, M  Limited ultrasound shows the patient does have significant dilatation with compression noted of the median nerve on the right side.  Tightness of the retinaculum noted. Impression: Carpal tunnel  02889; 15 additional minutes spent for Therapeutic exercises as stated in above notes.  This included exercises focusing on stretching, strengthening, with significant focus on eccentric aspects.   Long term goals include an improvement in range of motion, strength, endurance as well as avoiding reinjury. Patient's frequency would include in 1-2 times a day, 3-5 times a week for a duration of 6-12 weeks.  We discussed the anatomy involved, and that carpal tunnel syndrome primarily involves the median nerve, and this typically affects digits one through 3.   We also discussed that mild cases of carpal tunnel syndrome are often improved with night splints.  If symptoms persist it is very reasonable to consider a carpal tunnel injection.   If the patient does have moderate to severe carpal tunnel syndrome based on NCV, then it is certainly reasonable to consider surgical consultation for definitive management possible carpal tunnel release.  We also  discussed his severe carpal tunnel syndrome can lead to permanent nerve impairment even if released. At this point, the patient like to proceed conservatively. Proper technique shown and discussed handout in great detail with ATC.  All questions were discussed and answered.     Impression and Recommendations:     The above documentation has  been reviewed and is accurate and complete Emigdio Wildeman M Tierany Appleby, DO

## 2024-04-19 ENCOUNTER — Other Ambulatory Visit: Payer: Self-pay

## 2024-04-19 ENCOUNTER — Ambulatory Visit: Admitting: Family Medicine

## 2024-04-19 ENCOUNTER — Encounter: Payer: Self-pay | Admitting: Family Medicine

## 2024-04-19 VITALS — BP 106/62 | HR 70 | Ht 59.0 in | Wt 228.0 lb

## 2024-04-19 DIAGNOSIS — M79641 Pain in right hand: Secondary | ICD-10-CM | POA: Diagnosis not present

## 2024-04-19 DIAGNOSIS — G5601 Carpal tunnel syndrome, right upper limb: Secondary | ICD-10-CM

## 2024-04-19 DIAGNOSIS — L03119 Cellulitis of unspecified part of limb: Secondary | ICD-10-CM | POA: Diagnosis not present

## 2024-04-19 MED ORDER — DOXYCYCLINE HYCLATE 100 MG PO TABS: 100.0000 mg | ORAL_TABLET | Freq: Two times a day (BID) | ORAL | 0 refills | Status: DC

## 2024-04-19 NOTE — Assessment & Plan Note (Signed)
 Has had difficulties long ago but now more right greater than left.  Some mild thenar eminence wasting noted.  Discussed icing regimen of home exercises, discussed which activities to do and which ones to avoid.  Increase activity slowly as well.  Given a brace to wear at night.  Worsening pain will need to consider the possibility of injections.  Follow-up again in 2 months

## 2024-04-19 NOTE — Assessment & Plan Note (Signed)
 New onset of cellulitis secondary to peripheral edema.  Seems to be extremely superficial.  Doxycycline  given.  Of worsening inflammation or pain to seek medical attention.  Patient encouraged after complete resolution possible more compression sleeves.  May need to follow-up with primary care.

## 2024-04-19 NOTE — Patient Instructions (Signed)
 Brace on R side day and night for a 1 nightly for 2 weeks Doxy BID for 10 days See me again in 2 months

## 2024-04-24 ENCOUNTER — Encounter (HOSPITAL_BASED_OUTPATIENT_CLINIC_OR_DEPARTMENT_OTHER): Admitting: Internal Medicine

## 2024-05-02 ENCOUNTER — Other Ambulatory Visit: Payer: Self-pay | Admitting: Internal Medicine

## 2024-05-03 ENCOUNTER — Other Ambulatory Visit (HOSPITAL_COMMUNITY): Payer: Self-pay

## 2024-05-03 ENCOUNTER — Telehealth: Payer: Self-pay

## 2024-05-03 NOTE — Telephone Encounter (Signed)
 Copied from CRM [id number]. Topic: Clinical - Medication Prior Auth >> May 03, 2024  2:42 PM Henretta I wrote: Reason for CRM: Mliss from [facility] called to let us  know that pa for hydrOXYzine  (ATARAX ) 10 MG tablet was approved and they are going to fax over approval letter.

## 2024-05-03 NOTE — Telephone Encounter (Signed)
 Pharmacy Patient Advocate Encounter   Received notification from Patient Pharmacy that prior authorization for Hydroxyzine  10mg  tabs is required/requested.   Insurance verification completed.   The patient is insured through [facility] .   Per test claim: PA required; PA submitted to above mentioned insurance via CoverMyMeds Key/confirmation #/[facility] Status is pending

## 2024-05-04 NOTE — Telephone Encounter (Signed)
 Pharmacy Patient Advocate Encounter  Received notification from [facility] that Prior Authorization for Hydroxyzine  10mg  tabs has been APPROVED from 05/03/24 to 05/03/25   PA #/Case ID/Reference #: [id number]

## 2024-05-10 ENCOUNTER — Other Ambulatory Visit: Payer: Self-pay | Admitting: Internal Medicine

## 2024-05-26 DIAGNOSIS — H04123 Dry eye syndrome of bilateral lacrimal glands: Secondary | ICD-10-CM | POA: Diagnosis not present

## 2024-05-26 DIAGNOSIS — H40053 Ocular hypertension, bilateral: Secondary | ICD-10-CM | POA: Diagnosis not present

## 2024-05-26 DIAGNOSIS — H1045 Other chronic allergic conjunctivitis: Secondary | ICD-10-CM | POA: Diagnosis not present

## 2024-05-26 DIAGNOSIS — H02423 Myogenic ptosis of bilateral eyelids: Secondary | ICD-10-CM | POA: Diagnosis not present

## 2024-05-29 ENCOUNTER — Other Ambulatory Visit: Payer: Self-pay | Admitting: Internal Medicine

## 2024-05-29 DIAGNOSIS — Z1231 Encounter for screening mammogram for malignant neoplasm of breast: Secondary | ICD-10-CM

## 2024-06-14 LAB — LAB REPORT - SCANNED
EGFR: 35
Microalb Creat Ratio: <30

## 2024-06-20 ENCOUNTER — Ambulatory Visit: Admitting: Family Medicine

## 2024-07-05 ENCOUNTER — Encounter: Payer: Self-pay | Admitting: Internal Medicine

## 2024-07-05 ENCOUNTER — Ambulatory Visit: Payer: Self-pay | Admitting: Internal Medicine

## 2024-07-05 ENCOUNTER — Ambulatory Visit: Admitting: Internal Medicine

## 2024-07-05 VITALS — BP 136/68 | HR 58 | Temp 98.0°F | Ht 59.0 in | Wt 225.2 lb

## 2024-07-05 DIAGNOSIS — Z0001 Encounter for general adult medical examination with abnormal findings: Secondary | ICD-10-CM

## 2024-07-05 DIAGNOSIS — G4700 Insomnia, unspecified: Secondary | ICD-10-CM | POA: Diagnosis not present

## 2024-07-05 DIAGNOSIS — L03115 Cellulitis of right lower limb: Secondary | ICD-10-CM

## 2024-07-05 DIAGNOSIS — I10 Essential (primary) hypertension: Secondary | ICD-10-CM

## 2024-07-05 DIAGNOSIS — E1165 Type 2 diabetes mellitus with hyperglycemia: Secondary | ICD-10-CM

## 2024-07-05 DIAGNOSIS — E7800 Pure hypercholesterolemia, unspecified: Secondary | ICD-10-CM

## 2024-07-05 DIAGNOSIS — E538 Deficiency of other specified B group vitamins: Secondary | ICD-10-CM

## 2024-07-05 DIAGNOSIS — E559 Vitamin D deficiency, unspecified: Secondary | ICD-10-CM

## 2024-07-05 DIAGNOSIS — N1832 Chronic kidney disease, stage 3b: Secondary | ICD-10-CM

## 2024-07-05 DIAGNOSIS — Z23 Encounter for immunization: Secondary | ICD-10-CM | POA: Diagnosis not present

## 2024-07-05 LAB — MICROALBUMIN / CREATININE URINE RATIO
Creatinine,U: 110 mg/dL
Microalb Creat Ratio: 15.1 mg/g (ref 0.0–30.0)
Microalb, Ur: 1.7 mg/dL (ref 0.0–1.9)

## 2024-07-05 LAB — BASIC METABOLIC PANEL WITH GFR
BUN: 45 mg/dL — ABNORMAL HIGH (ref 6–23)
CO2: 28 meq/L (ref 19–32)
Calcium: 10.2 mg/dL (ref 8.4–10.5)
Chloride: 97 meq/L (ref 96–112)
Creatinine, Ser: 1.23 mg/dL — ABNORMAL HIGH (ref 0.40–1.20)
GFR: 42.41 mL/min — ABNORMAL LOW (ref >60.00)
Glucose, Bld: 105 mg/dL — ABNORMAL HIGH (ref 70–99)
Potassium: 4.6 meq/L (ref 3.5–5.1)
Sodium: 136 meq/L (ref 135–145)

## 2024-07-05 LAB — CBC WITH DIFFERENTIAL/PLATELET
Basophils Absolute: 0.1 K/uL (ref 0.0–0.1)
Basophils Relative: 1.1 % (ref 0.0–3.0)
Eosinophils Absolute: 0.7 K/uL (ref 0.0–0.7)
Eosinophils Relative: 9.4 % — ABNORMAL HIGH (ref 0.0–5.0)
HCT: 36.9 % (ref 36.0–46.0)
Hemoglobin: 12.1 g/dL (ref 12.0–15.0)
Lymphocytes Relative: 25.9 % (ref 12.0–46.0)
Lymphs Abs: 2 K/uL (ref 0.7–4.0)
MCHC: 32.8 g/dL (ref 30.0–36.0)
MCV: 86.9 fl (ref 78.0–100.0)
Monocytes Absolute: 0.8 K/uL (ref 0.1–1.0)
Monocytes Relative: 10.4 % (ref 3.0–12.0)
Neutro Abs: 4.2 K/uL (ref 1.4–7.7)
Neutrophils Relative %: 53.2 % (ref 43.0–77.0)
Platelets: 380 K/uL (ref 150.0–400.0)
RBC: 4.25 Mil/uL (ref 3.87–5.11)
RDW: 14.1 % (ref 11.5–15.5)
WBC: 7.9 K/uL (ref 4.0–10.5)

## 2024-07-05 LAB — LIPID PANEL
Cholesterol: 185 mg/dL (ref 0–200)
HDL: 53.2 mg/dL (ref >39.00)
LDL Cholesterol: 90 mg/dL (ref 0–99)
NonHDL: 131.36
Total CHOL/HDL Ratio: 3
Triglycerides: 205 mg/dL — ABNORMAL HIGH (ref 0.0–149.0)
VLDL: 41 mg/dL — ABNORMAL HIGH (ref 0.0–40.0)

## 2024-07-05 LAB — HEPATIC FUNCTION PANEL
ALT: 16 U/L (ref 0–35)
AST: 17 U/L (ref 0–37)
Albumin: 4.4 g/dL (ref 3.5–5.2)
Alkaline Phosphatase: 109 U/L (ref 39–117)
Bilirubin, Direct: 0.1 mg/dL (ref 0.0–0.3)
Total Bilirubin: 0.4 mg/dL (ref 0.2–1.2)
Total Protein: 7.7 g/dL (ref 6.0–8.3)

## 2024-07-05 LAB — URINALYSIS, ROUTINE W REFLEX MICROSCOPIC
Bilirubin Urine: NEGATIVE
Hgb urine dipstick: NEGATIVE
Ketones, ur: NEGATIVE
Nitrite: NEGATIVE
Specific Gravity, Urine: 1.01 (ref 1.000–1.030)
Total Protein, Urine: NEGATIVE
Urine Glucose: NEGATIVE
Urobilinogen, UA: 0.2 (ref 0.0–1.0)
pH: 5.5 (ref 5.0–8.0)

## 2024-07-05 LAB — VITAMIN D 25 HYDROXY (VIT D DEFICIENCY, FRACTURES): VITD: 75.31 ng/mL (ref 30.00–100.00)

## 2024-07-05 LAB — HEMOGLOBIN A1C: Hgb A1c MFr Bld: 6.9 % — ABNORMAL HIGH (ref 4.6–6.5)

## 2024-07-05 LAB — TSH: TSH: 0.64 u[IU]/mL (ref 0.35–5.50)

## 2024-07-05 LAB — VITAMIN B12: Vitamin B-12: >1500 pg/mL — ABNORMAL HIGH (ref 211–911)

## 2024-07-05 MED ORDER — FUROSEMIDE 80 MG PO TABS: 80.0000 mg | ORAL_TABLET | Freq: Two times a day (BID) | ORAL | 3 refills | Status: AC

## 2024-07-05 MED ORDER — ATORVASTATIN CALCIUM 40 MG PO TABS: 40.0000 mg | ORAL_TABLET | Freq: Every day | ORAL | 3 refills | Status: AC

## 2024-07-05 MED ORDER — GABAPENTIN 100 MG PO CAPS: 200.0000 mg | ORAL_CAPSULE | Freq: Every day | ORAL | 1 refills | Status: AC

## 2024-07-05 MED ORDER — AZITHROMYCIN 250 MG PO TABS: ORAL_TABLET | ORAL | 1 refills | Status: AC

## 2024-07-05 MED ORDER — METOPROLOL TARTRATE 50 MG PO TABS: 50.0000 mg | ORAL_TABLET | Freq: Two times a day (BID) | ORAL | 3 refills | Status: AC

## 2024-07-05 MED ORDER — PANTOPRAZOLE SODIUM 40 MG PO TBEC: DELAYED_RELEASE_TABLET | ORAL | 3 refills | Status: DC

## 2024-07-05 MED ORDER — POTASSIUM CHLORIDE CRYS ER 10 MEQ PO TBCR: 10.0000 meq | EXTENDED_RELEASE_TABLET | Freq: Every day | ORAL | 3 refills | Status: AC

## 2024-07-05 MED ORDER — DULOXETINE HCL 20 MG PO CPEP: ORAL_CAPSULE | ORAL | 3 refills | Status: AC

## 2024-07-05 MED ORDER — LOSARTAN POTASSIUM 100 MG PO TABS: 100.0000 mg | ORAL_TABLET | Freq: Every day | ORAL | 3 refills | Status: AC

## 2024-07-05 MED ORDER — DILTIAZEM HCL ER 240 MG PO CP24: 240.0000 mg | ORAL_CAPSULE | Freq: Every day | ORAL | 3 refills | Status: AC

## 2024-07-05 NOTE — Progress Notes (Signed)
 The test results show that your current treatment is OK, as the tests are stable.  Please continue the same plan.  There is no other need for change of treatment or further evaluation based on these results, at this time.  thanks

## 2024-07-05 NOTE — Assessment & Plan Note (Signed)
Mild to mod, for antibx course,  to f/u any worsening symptoms or concerns 

## 2024-07-05 NOTE — Progress Notes (Signed)
 Patient ID: [name], female   DOB: [birth year], 78 y.o.   MRN: [id number]         Chief Complaint:: wellness exam and right ankle cellulitis, insomnia, ckd 3a, dh, htn, hld, low vit d       HPI:  [name] is a 78 y.o. female here for wellness exam; due for flu shot, o/w up to date                        Also has mild worsening insomnia in the past 3 mo, just hard to get to sleep several nights.  Denies worsening depressive symptoms, suicidal ideation, or panic; has ongoing anxiety, not increased recently.   Does also have a right medial distal leg ankle area red, tender, swelling about 3 cm area but no ulcer or drainage. Denies fever.  Pt denies chest pain, increased sob or doe, wheezing, orthopnea, PND, increased LE swelling, palpitations, dizziness or syncope.   Pt denies polydipsia, polyuria, or new focal neuro s/s.      Wt Readings from Last 3 Encounters:  07/05/24 225 lb 3.2 oz (102.2 kg)  04/19/24 228 lb (103.4 kg)  01/03/24 230 lb (104.3 kg)   BP Readings from Last 3 Encounters:  07/05/24 136/68  04/19/24 106/62  03/13/24 (!) 127/59   Immunization History  Administered Date(s) Administered   Fluad Quad(high Dose 65+) 07/12/2020, 07/14/2021, 11/16/2022   Fluad Trivalent(High Dose 65+) 07/05/2023   INFLUENZA, HIGH DOSE SEASONAL PF 08/01/2013, 08/11/2016, 07/26/2017, 06/24/2018, 07/11/2019, 07/05/2024   Influenza Split 09/30/2012   Influenza,inj,Quad PF,6+ Mos 07/26/2015   Moderna Sars-Covid-2 Vaccination 11/25/2019, 12/18/2019, 08/05/2020   Pneumococcal Conjugate-13 03/15/2014   Pneumococcal Polysaccharide-23 01/30/2013   Tdap 01/18/1999, 04/13/2018, 06/20/2022   There are no preventive care reminders to display for this patient.     Past Medical History:  Diagnosis Date   Allergic rhinitis, cause unspecified    Allergy     SEASONAL   Anemia    Cataract    Bilateral   CHF (congestive heart failure) (HCC)    Chronic headaches    Constipation     Diverticulosis    DJD (degenerative joint disease), lumbar    GERD (gastroesophageal reflux disease)    Hemorrhoids    Hiatal hernia    HTN (hypertension)    Hyperlipidemia    Obesity    Osteoarthritis    Tubular adenoma of colon    Past Surgical History:  Procedure Laterality Date   ABLATION     uterus   COLONOSCOPY  05/15/2021   Pyrtle   TOTAL KNEE ARTHROPLASTY Right 04/14/2019   Procedure: TOTAL KNEE ARTHROPLASTY;  Surgeon: Yvone Rush, MD;  Location: [facility];  Service: Orthopedics;  Laterality: Right;   TUBAL LIGATION      reports that she has never smoked. She has never been exposed to tobacco smoke. She has never used smokeless tobacco. She reports that she does not drink alcohol  and does not use drugs. family history includes Breast cancer in her sister; Diabetes in her brother; Heart disease in her mother; Hypertension in her mother. Allergies  Allergen Reactions   Doxycycline  Itching    Rash, itching   Oxycodone     Soybean-Containing Drug Products Other (See Comments)    Allergy  testing positive for soy beans but pt uses soy sauce   Sulfonamide Derivatives Itching and Rash   Current Outpatient Medications on File Prior to Visit  Medication Sig Dispense Refill   aspirin -acetaminophen -caffeine  (  EXCEDRIN  MIGRAINE) 250-250-65 MG tablet Take 1 tablet by mouth every 6 (six) hours as needed for headache. 30 tablet 2   azelastine  (ASTELIN ) 0.1 % nasal spray Place 1 spray into both nostrils 2 (two) times daily. Use in each nostril as directed 90 mL 4   azelastine  (OPTIVAR ) 0.05 % ophthalmic solution Place 1 drop into both eyes 2 (two) times daily. 6 mL 12   betamethasone , augmented, (DIPROLENE ) 0.05 % lotion Apply topically daily as needed. 30 mL 1   cetirizine  (ZYRTEC ) 10 MG tablet Take 1 tablet (10 mg total) by mouth daily as needed for allergies. As needed 90 tablet 3   Cholecalciferol  50 MCG (2000 UT) TABS 1 tab by mouth once daily 100 tablet 3   cyanocobalamin  (VITAMIN  B12) 1000 MCG tablet Take 1 tablet (1,000 mcg total) by mouth daily. 100 tablet 3   ferrous sulfate  325 (65 FE) MG tablet Take 1 tablet (325 mg total) by mouth 2 (two) times daily with a meal. 60 tablet 2   HYDROcodone -acetaminophen  (NORCO/VICODIN) 5-325 MG tablet Take 0.5 tablets by mouth daily as needed.     hydrocortisone  2.5 % lotion Apply topically daily. 59 mL 1   hydrOXYzine  (ATARAX ) 10 MG tablet TAKE 1 TABLET(10 MG) BY MOUTH AT BEDTIME AS NEEDED FOR ITCHING 60 tablet 2   ketoconazole  (NIZORAL ) 2 % shampoo Apply topically once a week. 120 mL 1   mupirocin  ointment (BACTROBAN ) 2 % Apply 1 Application topically 2 (two) times daily. 15 g 0   ondansetron  (ZOFRAN ) 4 MG tablet Take 1 tablet (4 mg total) by mouth every 8 (eight) hours as needed for nausea or vomiting. 40 tablet 1   Polyethyl Glycol-Propyl Glycol (SYSTANE OP) Place 1 drop into both eyes 3 (three) times daily as needed (dry eyes).     polyethylene glycol powder (MIRALAX ) 17 GM/SCOOP powder Take 17 g by mouth daily as needed (constipation). 850 g 3   tiZANidine  (ZANAFLEX ) 2 MG tablet Take 1 tablet (2 mg total) by mouth every 8 (eight) hours as needed for muscle spasms. 60 tablet 3   traMADol  (ULTRAM ) 50 MG tablet Take 1-2 tablets (50-100 mg total) by mouth every 6 (six) hours as needed. 30 tablet 1   triamcinolone  (NASACORT ) 55 MCG/ACT AERO nasal inhaler Place 2 sprays into the nose daily. 1 each 12   triamcinolone  cream (KENALOG ) 0.1 % Apply 1 Application topically 2 (two) times daily. 453 g 0   No current facility-administered medications on file prior to visit.        ROS:  All others reviewed and negative.  Objective        PE:  BP 136/68   Pulse (!) 58   Temp 98 F (36.7 C)   Ht 4' 11 (1.499 m)   Wt 225 lb 3.2 oz (102.2 kg)   SpO2 98%   BMI 45.48 kg/m                 Constitutional: Pt appears in NAD               HENT: Head: NCAT.                Right Ear: External ear normal.                 Left Ear: External  ear normal.                Eyes: . Pupils are equal, round, and reactive to light. Conjunctivae  and EOM are normal               Nose: without d/c or deformity               Neck: Neck supple. Gross normal ROM               Cardiovascular: Normal rate and regular rhythm.                 Pulmonary/Chest: Effort normal and breath sounds without rales or wheezing.                Abd:  Soft, NT, ND, + BS, no organomegaly               Neurological: Pt is alert. At baseline orientation, motor grossly intact               Skin: Skin is warm. LE edema - none, has 3 cm area red, tender slight swelling to right distal leg adjacent to medial ankle               Psychiatric: Pt behavior is normal without agitation   Micro: none  Cardiac tracings I have personally interpreted today:  none  Pertinent Radiological findings (summarize): none   Lab Results  Component Value Date   WBC 7.9 07/05/2024   HGB 12.1 07/05/2024   HCT 36.9 07/05/2024   PLT 380.0 07/05/2024   GLUCOSE 105 (H) 07/05/2024   CHOL 185 07/05/2024   TRIG 205.0 (H) 07/05/2024   HDL 53.20 07/05/2024   LDLDIRECT 93.0 01/14/2021   LDLCALC 90 07/05/2024   ALT 16 07/05/2024   AST 17 07/05/2024   NA 136 07/05/2024   K 4.6 07/05/2024   CL 97 07/05/2024   CREATININE 1.23 (H) 07/05/2024   BUN 45 (H) 07/05/2024   CO2 28 07/05/2024   TSH 0.64 07/05/2024   INR 1.2 08/13/2020   HGBA1C 6.9 (H) 07/05/2024   MICROALBUR 1.7 07/05/2024   Assessment/Plan:  [name] is a 78 y.o. Black or African American [2] female with  has a past medical history of Allergic rhinitis, cause unspecified, Allergy , Anemia, Cataract, CHF (congestive heart failure) (HCC), Chronic headaches, Constipation, Diverticulosis, DJD (degenerative joint disease), lumbar, GERD (gastroesophageal reflux disease), Hemorrhoids, Hiatal hernia, HTN (hypertension), Hyperlipidemia, Obesity, Osteoarthritis, and Tubular adenoma of colon.  Encounter for well adult exam  with abnormal findings Age and sex appropriate education and counseling updated with regular exercise and diet Referrals for preventative services - none needed Immunizations addressed - due for flu shot Smoking counseling  - none needed Evidence for depression or other mood disorder - none significant Most recent labs reviewed. I have personally reviewed and have noted: 1) the patient's medical and social history 2) The patient's current medications and supplements 3) The patient's height, weight, and BMI have been recorded in the chart   Cellulitis of right lower extremity Mild, for antibx course zpack,  to f/u any worsening symptoms or concerns  CKD (chronic kidney disease) stage 3, GFR 30-59 ml/min (HCC) Lab Results  Component Value Date   CREATININE 1.23 (H) 07/05/2024   Stable overall, cont to avoid nephrotoxins  Diabetes (HCC) Lab Results  Component Value Date   HGBA1C 6.9 (H) 07/05/2024   Stable, pt to continue current medical treatment  - diet, wt control, declines oha for now   Essential (primary) hypertension BP Readings from Last 3 Encounters:  07/05/24 136/68  04/19/24 106/62  03/13/24 (!) 127/59  Stable, pt to continue medical treatment dilacor 240 every day, losartan  100 mg every day, lopressor  50 bid   HLD (hyperlipidemia) Lab Results  Component Value Date   LDLCALC 90 07/05/2024   Uncontrolled,, pt to continue current statin lipitor 40 every day, declines change for now   Insomnia Mild intermittent, for OTC melatonin up to 10 mg at bedtime prn,  to f/u any worsening symptoms or concerns  Vitamin D  deficiency Last vitamin D  Lab Results  Component Value Date   VD25OH 75.31 07/05/2024   Stable, cont oral replacement   Cellulitis of right leg Mild to mod, for antibx course,  to f/u any worsening symptoms or concerns  Followup: Return in about 6 months (around 01/02/2025).  Lynwood Rush, MD 07/05/2024 8:18 PM [facility]

## 2024-07-05 NOTE — Assessment & Plan Note (Signed)
 Age and sex appropriate education and counseling updated with regular exercise and diet Referrals for preventative services - none needed Immunizations addressed - due for flu shot Smoking counseling  - none needed Evidence for depression or other mood disorder - none significant Most recent labs reviewed. I have personally reviewed and have noted: 1) the patient's medical and social history 2) The patient's current medications and supplements 3) The patient's height, weight, and BMI have been recorded in the chart

## 2024-07-05 NOTE — Assessment & Plan Note (Signed)
 Last vitamin D  Lab Results  Component Value Date   VD25OH 75.31 07/05/2024   Stable, cont oral replacement

## 2024-07-05 NOTE — Assessment & Plan Note (Signed)
 Mild intermittent, for OTC melatonin up to 10 mg at bedtime prn,  to f/u any worsening symptoms or concerns

## 2024-07-05 NOTE — Assessment & Plan Note (Signed)
 Lab Results  Component Value Date   LDLCALC 90 07/05/2024   Uncontrolled,, pt to continue current statin lipitor 40 every day, declines change for now

## 2024-07-05 NOTE — Assessment & Plan Note (Signed)
 Mild, for antibx course zpack,  to f/u any worsening symptoms or concerns

## 2024-07-05 NOTE — Assessment & Plan Note (Signed)
 BP Readings from Last 3 Encounters:  07/05/24 136/68  04/19/24 106/62  03/13/24 (!) 127/59   Stable, pt to continue medical treatment dilacor 240 every day, losartan  100 mg every day, lopressor  50 bid

## 2024-07-05 NOTE — Assessment & Plan Note (Signed)
 Lab Results  Component Value Date   CREATININE 1.23 (H) 07/05/2024   Stable overall, cont to avoid nephrotoxins

## 2024-07-05 NOTE — Assessment & Plan Note (Signed)
 Lab Results  Component Value Date   HGBA1C 6.9 (H) 07/05/2024   Stable, pt to continue current medical treatment  - diet, wt control, declines oha for now

## 2024-07-05 NOTE — Patient Instructions (Addendum)
 You had the flu shot today  Please consider having the Prevnar 20 pneumonia shot at the pharmacy  Please take all new medication as prescribed --the antibiotic for the right ankle area  Ok to try the OTC Melatonin up to 10 mg at bedtime as needed to help with sleep  Please continue all other medications as before, and refills have been done if requested.  Please have the pharmacy call with any other refills you may need.  Please continue your efforts at being more active, low cholesterol diet, and weight control.  You are otherwise up to date with prevention measures today.  Please keep your appointments with your specialists as you may have planned  Please go to the LAB at the blood drawing area for the tests to be done  You will be contacted by phone if any changes need to be made immediately.  Otherwise, you will receive a letter about your results with an explanation, but please check with MyChart first.  Please make an Appointment to return in 6 months, or sooner if needed

## 2024-07-07 ENCOUNTER — Telehealth: Payer: Self-pay

## 2024-07-07 ENCOUNTER — Other Ambulatory Visit: Payer: Medicare Other

## 2024-07-07 NOTE — Telephone Encounter (Signed)
 Copied from CRM #[id number]. Topic: Clinical - Medical Advice >> Jul 07, 2024 12:46 PM Rea ORN wrote: Reason for CRM: Pt called to see what rx can be called in for the redness and tenderness in right ankle. It was discussed at her last office visit.

## 2024-07-10 ENCOUNTER — Telehealth: Payer: Self-pay | Admitting: Gastroenterology

## 2024-07-10 NOTE — Telephone Encounter (Signed)
 Patient calls stating that pantoprazole  is no longer working for her. States for a few months she has had increased heartburn with epigastric discomfort at times. Positive nausea intermittently but no vomiting. Notes that she has been having some dysphagia to hamburger/steal/chicken over the last couple of months as well. Patient currently takes pantoprazole  once daily. Patient is unable to definitively tell me if she follows antireflux diet, only says I dont put spices in my food and I dont eat citrus.  I have reiterated antireflux diet, antireflux measures. Discussed avoiding meats that she knows she has difficulty swallowing and chewing food very well with sips of fluids in between bites. She is asked to increase pantoprazole  to 40 mg twice daily, 30 minutes before breakfast and dinner until she can be seen in the office. She has been scheduled for office visit with Ellouise Console, PA-C on 07/26/24. Patient is advised that should she have any episodes with inability of PO intake, intractable nausea/vomiting/chest pain, she should present to the emergency room prior to 07/26/24 for sooner evaluation. She verbalizes understanding.

## 2024-07-10 NOTE — Telephone Encounter (Signed)
 Inbound call from patient stating she would like to speak to nurse in regards to medication protonix , patient stating she has been on it for a while now but has not been helping her. Patient wants to know if she would need to be seen in office first or if she can be prescribed something different. Please advise  Thank you

## 2024-07-11 DIAGNOSIS — M48062 Spinal stenosis, lumbar region with neurogenic claudication: Secondary | ICD-10-CM | POA: Diagnosis not present

## 2024-07-11 DIAGNOSIS — F1120 Opioid dependence, uncomplicated: Secondary | ICD-10-CM | POA: Diagnosis not present

## 2024-07-13 MED ORDER — TRIAMCINOLONE ACETONIDE 0.1 % EX CREA: 1.0000 | TOPICAL_CREAM | Freq: Two times a day (BID) | CUTANEOUS | 0 refills | Status: AC

## 2024-07-13 NOTE — Telephone Encounter (Signed)
 Ok for topical steroid - done erx

## 2024-07-13 NOTE — Addendum Note (Signed)
 Addended by: NORLEEN LYNWOOD ORN on: 07/13/2024 05:00 PM   Modules accepted: Orders

## 2024-07-14 NOTE — Telephone Encounter (Signed)
 LM for pt informing of rx sent

## 2024-07-17 ENCOUNTER — Other Ambulatory Visit

## 2024-07-20 DIAGNOSIS — L218 Other seborrheic dermatitis: Secondary | ICD-10-CM | POA: Diagnosis not present

## 2024-07-26 ENCOUNTER — Ambulatory Visit: Admitting: Physician Assistant

## 2024-07-28 ENCOUNTER — Ambulatory Visit (INDEPENDENT_AMBULATORY_CARE_PROVIDER_SITE_OTHER)
Admission: RE | Admit: 2024-07-28 | Discharge: 2024-07-28 | Disposition: A | Discharge status: Home / Self Care | Source: Ambulatory Visit | Attending: Internal Medicine | Admitting: Internal Medicine

## 2024-07-28 DIAGNOSIS — Z780 Asymptomatic menopausal state: Secondary | ICD-10-CM | POA: Diagnosis not present

## 2024-08-13 ENCOUNTER — Other Ambulatory Visit: Payer: Self-pay | Admitting: Internal Medicine

## 2024-08-21 ENCOUNTER — Ambulatory Visit
Admission: RE | Admit: 2024-08-21 | Discharge: 2024-08-21 | Disposition: A | Source: Ambulatory Visit | Attending: Internal Medicine | Admitting: Internal Medicine

## 2024-08-21 DIAGNOSIS — Z1231 Encounter for screening mammogram for malignant neoplasm of breast: Secondary | ICD-10-CM | POA: Diagnosis not present

## 2024-09-07 NOTE — Progress Notes (Signed)
 Brandy Console, PA-C [address] [place], [place]  [address] Phone: [phone]   Primary Care Physician: Brandy Lynwood ORN, MD  Primary Gastroenterologist:  [name]   Chief Complaint: Follow-up dysphagia, GERD, and constipation       HPI:   Discussed the use of AI scribe software for clinical note transcription with the patient, who gave verbal consent to proceed.  78 year old female is here for follow-up of GERD, H. pylori negative gastritis, and dysphagia.  Currently taking pantoprazole  40 mg once daily and needs medication refill.  She takes MiraLAX  for constipation.  Is on ferrous sulfate  325 mg daily for IDA.  History of chronic iron deficiency anemia with negative GI workup, other than erosive gastritis.  07/05/24 Last Labs: Normal Hgb 12.1g, Normal CBC.  Normal B12 and Vitamin D .  CKD with BUN 45, Cr 1.23, GFR 42.41. History of Present Illness She experiences difficulty swallowing, particularly with beef and large pills, feeling as though food sometimes gets stuck in her chest. Relief is achieved by drinking water  and tilting her head back. No vomiting is reported, but liquids help the food pass down.  She experiences occasional heartburn, only after consuming foods like spaghetti and milk, which she avoids. Due to lactose intolerance, she uses almond milk instead of regular milk to prevent diarrhea. She takes pantoprazole  once daily for acid reflux and occasionally uses Mylanta for breakthrough heartburn.  She reports constipation and is supposed to take Miralax  daily but does not do so consistently. When taken, Miralax  is effective by the next day. She sometimes experiences loose stools, particularly after consuming rich foods.  04/2021 last EGD by Dr. Starch: No stricture, however she underwent empiric esophageal dilation to 18 mm for dysphagia.  There was moderate erosive gastritis.  Normal duodenum.  Biopsies negative for H. pylori or  dysplasia.  04/2021 last colonoscopy by Dr. Starch: 1 small 3 mm tubular adenoma polyp removed.  Pandiverticulosis.  Good prep.  No repeat due to advanced age.  Capsule endoscopy for iron deficiency anemia 01/21/2015 - Complete study with good prep - Negative study   EGD 12/10/2014 for iron deficiency anemia 1. The mucosa of the esophagus appeared normal  2. Acute gastritis ( inflammation) was found in the prepyloric region of the stomach and gastric antrum; multiple biopsies were performed . (Mild chronic active gastritis, negative for H. pylori) 3. Few small, benign, sessile polyps were found in the gastric fundus and cardia  4. The duodenal mucosa showed no abnormalities in the bulb and 2nd part of the duodenum   Colonoscopy 12/10/2014 for deficiency anemia 1. The examined terminal ileum appeared to be normal  2. Sessile polyp was found in the descending colon; polypectomy was performed with a cold snare (polypoid mucosa) 3. Mild diverticulosis was noted in the descending colon and sigmoid colon  Current Outpatient Medications  Medication Sig Dispense Refill   aspirin -acetaminophen -caffeine  (EXCEDRIN  MIGRAINE) 250-250-65 MG tablet Take 1 tablet by mouth every 6 (six) hours as needed for headache. 30 tablet 2   atorvastatin  (LIPITOR) 40 MG tablet Take 1 tablet (40 mg total) by mouth daily. 90 tablet 3   azelastine  (ASTELIN ) 0.1 % nasal spray Place 1 spray into both nostrils 2 (two) times daily. Use in each nostril as directed 90 mL 4   azelastine  (OPTIVAR ) 0.05 % ophthalmic solution Place 1 drop into both eyes 2 (two) times daily. 6 mL 12   betamethasone , augmented, (DIPROLENE ) 0.05 %  lotion Apply topically daily as needed. 30 mL 1   cetirizine  (ZYRTEC ) 10 MG tablet Take 1 tablet (10 mg total) by mouth daily as needed for allergies. As needed 90 tablet 3   Cholecalciferol  50 MCG (2000 UT) TABS 1 tab by mouth once daily 100 tablet 3   cyanocobalamin  (VITAMIN B12) 1000 MCG tablet Take 1 tablet  (1,000 mcg total) by mouth daily. 100 tablet 3   diltiazem  (DILACOR XR ) 240 MG 24 hr capsule Take 1 capsule (240 mg total) by mouth daily. 90 capsule 3   DULoxetine  (CYMBALTA ) 20 MG capsule TAKE 2 CAPSULES EVERY DAY 180 capsule 3   ferrous sulfate  325 (65 FE) MG tablet Take 1 tablet (325 mg total) by mouth 2 (two) times daily with a meal. 60 tablet 2   furosemide  (LASIX ) 80 MG tablet Take 1 tablet (80 mg total) by mouth 2 (two) times daily. 180 tablet 3   gabapentin  (NEURONTIN ) 100 MG capsule Take 2 capsules (200 mg total) by mouth at bedtime. 180 capsule 1   HYDROcodone -acetaminophen  (NORCO/VICODIN) 5-325 MG tablet Take 0.5 tablets by mouth daily as needed.     hydrocortisone  2.5 % lotion Apply topically daily. (Patient taking differently: Apply topically as needed.) 59 mL 1   hydrOXYzine  (ATARAX ) 10 MG tablet TAKE 1 TABLET(10 MG) BY MOUTH AT BEDTIME AS NEEDED FOR ITCHING 60 tablet 2   ketoconazole  (NIZORAL ) 2 % shampoo Apply topically once a week. 120 mL 1   losartan  (COZAAR ) 100 MG tablet Take 1 tablet (100 mg total) by mouth daily. 90 tablet 3   metoprolol  tartrate (LOPRESSOR ) 50 MG tablet Take 1 tablet (50 mg total) by mouth 2 (two) times daily. 180 tablet 3   mupirocin  ointment (BACTROBAN ) 2 % Apply 1 Application topically 2 (two) times daily. 15 g 0   ondansetron  (ZOFRAN ) 4 MG tablet Take 1 tablet (4 mg total) by mouth every 8 (eight) hours as needed for nausea or vomiting. 40 tablet 1   Polyethyl Glycol-Propyl Glycol (SYSTANE OP) Place 1 drop into both eyes 3 (three) times daily as needed (dry eyes).     polyethylene glycol powder (MIRALAX ) 17 GM/SCOOP powder Take 17 g by mouth daily as needed (constipation). 850 g 3   potassium chloride  (KLOR-CON  M) 10 MEQ tablet Take 1 tablet (10 mEq total) by mouth daily. 90 tablet 3   tiZANidine  (ZANAFLEX ) 2 MG tablet Take 1 tablet (2 mg total) by mouth every 8 (eight) hours as needed for muscle spasms. 60 tablet 3   triamcinolone  (NASACORT ) 55 MCG/ACT  AERO nasal inhaler Place 2 sprays into the nose daily. 1 each 12   triamcinolone  cream (KENALOG ) 0.1 % Apply 1 Application topically 2 (two) times daily. 453 g 0   pantoprazole  (PROTONIX ) 40 MG tablet TAKE 1 TABLET(40 MG) BY MOUTH DAILY-NEEDS APPT FOR FURTHER REFILLS 90 tablet 3   traMADol  (ULTRAM ) 50 MG tablet Take 1-2 tablets (50-100 mg total) by mouth every 6 (six) hours as needed. (Patient not taking: Reported on 09/08/2024) 30 tablet 1   No current facility-administered medications for this visit.    Allergies as of 09/08/2024 - Review Complete 09/08/2024  Allergen Reaction Noted   Doxycycline  Itching 05/15/2022   Oxycodone   07/24/2019   Soybean-containing drug products Other (See Comments) 11/14/2014   Sulfonamide derivatives Itching and Rash     Past Medical History:  Diagnosis Date   Allergic rhinitis, cause unspecified    Allergy     SEASONAL   Anemia  Cataract    Bilateral   CHF (congestive heart failure) (HCC)    Chronic headaches    Constipation    Diverticulosis    DJD (degenerative joint disease), lumbar    GERD (gastroesophageal reflux disease)    Hemorrhoids    Hiatal hernia    HTN (hypertension)    Hyperlipidemia    Obesity    Osteoarthritis    Tubular adenoma of colon     Past Surgical History:  Procedure Laterality Date   ABLATION     uterus   COLONOSCOPY  05/15/2021   [place]   TOTAL KNEE ARTHROPLASTY Right 04/14/2019   Procedure: TOTAL KNEE ARTHROPLASTY;  Surgeon: Yvone Rush, MD;  Location: [facility];  Service: Orthopedics;  Laterality: Right;   TUBAL LIGATION      Review of Systems:    All systems reviewed and negative except where noted in HPI.    Physical Exam:  BP 130/74 (BP Location: Left Arm, Patient Position: Sitting, Cuff Size: Large)   Pulse 63   Ht 4' 11 (1.499 m)   Wt 221 lb 8 oz (100.5 kg)   SpO2 96%   BMI 44.74 kg/m  No LMP recorded. Patient is postmenopausal.  General: Well-nourished, well-developed in no acute  distress.  Lungs: Clear to auscultation bilaterally. Non-labored. Heart: Regular rate and rhythm, no murmurs rubs or gallops.  Abdomen: Bowel sounds are normal; Abdomen is Soft; No hepatosplenomegaly, masses or hernias;  No Abdominal Tenderness; No guarding or rebound tenderness. Neuro: Alert and oriented x 3.  Grossly intact.  Psych: Alert and cooperative, normal mood and affect.   Imaging Studies: MM 3D SCREENING MAMMOGRAM BILATERAL BREAST Result Date: 08/23/2024 CLINICAL DATA:  Screening. EXAM: DIGITAL SCREENING BILATERAL MAMMOGRAM WITH TOMOSYNTHESIS AND CAD TECHNIQUE: Bilateral screening digital craniocaudal and mediolateral oblique mammograms were obtained. Bilateral screening digital breast tomosynthesis was performed. The images were evaluated with computer-aided detection. COMPARISON:  Previous exam(s). ACR Breast Density Category b: There are scattered areas of fibroglandular density. FINDINGS: There are no findings suspicious for malignancy. IMPRESSION: No mammographic evidence of malignancy. A result letter of this screening mammogram will be mailed directly to the patient. RECOMMENDATION: Screening mammogram in one year. (Code:SM-B-01Y) BI-RADS CATEGORY  1: Negative. Electronically Signed   By: Alm Parkins M.D.   On: 08/23/2024 10:48    Labs: CBC    Component Value Date/Time   WBC 7.9 07/05/2024 1412   RBC 4.25 07/05/2024 1412   HGB 12.1 07/05/2024 1412   HCT 36.9 07/05/2024 1412   PLT 380.0 07/05/2024 1412   MCV 86.9 07/05/2024 1412   MCH 25.2 (L) 09/20/2020 1614   MCHC 32.8 07/05/2024 1412   RDW 14.1 07/05/2024 1412   LYMPHSABS 2.0 07/05/2024 1412   MONOABS 0.8 07/05/2024 1412   EOSABS 0.7 07/05/2024 1412   BASOSABS 0.1 07/05/2024 1412    CMP     Component Value Date/Time   NA 136 07/05/2024 1412   K 4.6 07/05/2024 1412   CL 97 07/05/2024 1412   CO2 28 07/05/2024 1412   GLUCOSE 105 (H) 07/05/2024 1412   BUN 45 (H) 07/05/2024 1412   CREATININE 1.23 (H)  07/05/2024 1412   CREATININE 1.04 (H) 09/20/2020 1614   CALCIUM  10.2 07/05/2024 1412   PROT 7.7 07/05/2024 1412   ALBUMIN 4.4 07/05/2024 1412   AST 17 07/05/2024 1412   ALT 16 07/05/2024 1412   ALKPHOS 109 07/05/2024 1412   BILITOT 0.4 07/05/2024 1412   GFRNONAA >60 08/22/2020 0035   GFRAA >  60 12/18/2019 1540     Assessment and Plan:   [name] is a 78 y.o. y/o female presents for follow-up of:  1.  GERD - Continue Protonix  40mg  1 tablet once daily, #90, 3 RF. - Take OTC Tums, Mylanta, or other antiacid as needed for breakthrough GERD symptoms. - Recommend Lifestyle Modifications to prevent Acid Reflux.  Rec. Avoid coffee, sodas, peppermint, garlic, onions, alcohol , citrus fruits, chocolate, tomatoes, fatty and spicey foods.  Avoid eating 2-3 hours before bedtime.    2.  Erosive gastritis (H. pylori negative) - Continue Protonix  40mg  1 tablet once daily. - Avoid NSAIDs  3.  Chronic dysphagia.  Previous EGD 04/2021 unrevealing. Intermittent dysphagia with large pills and certain foods noted. - Scheduled - Barium Swallow with Tablet to evaluate esophagus for strictures or motility issues. - Consider repeat upper endoscopy with Dr. Albertus for esophageal dilation if barium swallow shows stricture. - Discussed risks of upper endoscopy, including bleeding and perforation, and emphasized avoiding unnecessary procedures due to advanced age.  4.  Chronic constipation - Continue Miralax  - Take 1 capful every day for constipation. - Add Benefiber 1 - 2 TBSP daily to regimen for better bowel movement consistency. - Monitor bowel movements and adjust Miralax  and Benefiber use accordingly.   Brandy Console, PA-C  Follow up based on barium swallow test results and GI symptoms.  Follow-up in 1 year to refill Protonix .

## 2024-09-08 ENCOUNTER — Ambulatory Visit: Admitting: Physician Assistant

## 2024-09-08 ENCOUNTER — Encounter: Payer: Self-pay | Admitting: Physician Assistant

## 2024-09-08 VITALS — BP 130/74 | HR 63 | Ht 59.0 in | Wt 221.5 lb

## 2024-09-08 DIAGNOSIS — K5904 Chronic idiopathic constipation: Secondary | ICD-10-CM

## 2024-09-08 DIAGNOSIS — K219 Gastro-esophageal reflux disease without esophagitis: Secondary | ICD-10-CM

## 2024-09-08 DIAGNOSIS — R1310 Dysphagia, unspecified: Secondary | ICD-10-CM

## 2024-09-08 MED ORDER — PANTOPRAZOLE SODIUM 40 MG PO TBEC: DELAYED_RELEASE_TABLET | ORAL | 3 refills | Status: AC

## 2024-09-08 NOTE — Patient Instructions (Addendum)
 Start OTC Benefiber Powder. Mix 1 - 2 Tablespoons in 6 - 8 ounces of a Drink Once Daily. Drink 64 ounces of water  / fluids Daily.   For constipation: Start OTC Miralax  Powder Mix 1 capful in 6 to 8 ounces of a drink once daily  Recommend high-fiber diet, 30 g of fiber daily Eat fruits, vegetables, and whole grains Drink 64 ounces of water  / fluids daily.    You have been scheduled for a Barium Esophogram at [facility] (1st floor of the hospital) on 09/27/24 at 10:00am. Please arrive 30 minutes prior to your appointment for registration. Make certain not to have anything to eat or drink 3 hours prior to your test. If you need to reschedule for any reason, please contact radiology at [phone] to do so. __________________________________________________________________ A barium swallow is an examination that concentrates on views of the esophagus. This tends to be a double contrast exam (barium and two liquids which, when combined, create a gas to distend the wall of the oesophagus) or single contrast (non-ionic iodine  based). The study is usually tailored to your symptoms so a good history is essential. Attention is paid during the study to the form, structure and configuration of the esophagus, looking for functional disorders (such as aspiration, dysphagia, achalasia, motility and reflux) EXAMINATION You may be asked to change into a gown, depending on the type of swallow being performed. A radiologist and radiographer will perform the procedure. The radiologist will advise you of the type of contrast selected for your procedure and direct you during the exam. You will be asked to stand, sit or lie in several different positions and to hold a small amount of fluid in your mouth before being asked to swallow while the imaging is performed .In some instances you may be asked to swallow barium coated marshmallows to assess the motility of a solid food bolus. The exam can be recorded  as a digital or video fluoroscopy procedure. POST PROCEDURE It will take 1-2 days for the barium to pass through your system. To facilitate this, it is important, unless otherwise directed, to increase your fluids for the next 24-48hrs and to resume your normal diet.  This test typically takes about 30 minutes to perform. __________________________________________________________________________________  Please follow up sooner if symptoms increase or worsen  Due to recent changes in healthcare laws, you may see the results of your imaging and laboratory studies on MyChart before your provider has had a chance to review them.  We understand that in some cases there may be results that are confusing or concerning to you. Not all laboratory results come back in the same time frame and the provider may be waiting for multiple results in order to interpret others.  Please give us  48 hours in order for your provider to thoroughly review all the results before contacting the office for clarification of your results.   Thank you for trusting me with your gastrointestinal care!   Ellouise Console, PA-C _______________________________________________________  If your blood pressure at your visit was 140/90 or greater, please contact your primary care physician to follow up on this.  _______________________________________________________  If you are age 65 or older, your body mass index should be between 23-30. Your Body mass index is 44.74 kg/m. If this is out of the aforementioned range listed, please consider follow up with your Primary Care Provider.  If you are age 63 or younger, your body mass index should be between 19-25. Your Body mass index  is 44.74 kg/m. If this is out of the aformentioned range listed, please consider follow up with your Primary Care Provider.   ________________________________________________________  The [facility] providers would like to encourage you to use MYCHART to  communicate with providers for non-urgent requests or questions.  Due to long hold times on the telephone, sending your provider a message by [facility] may be a faster and more efficient way to get a response.  Please allow 48 business hours for a response.  Please remember that this is for non-urgent requests.  _______________________________________________________

## 2024-09-27 ENCOUNTER — Ambulatory Visit (HOSPITAL_COMMUNITY)

## 2024-10-03 DIAGNOSIS — F1120 Opioid dependence, uncomplicated: Secondary | ICD-10-CM | POA: Diagnosis not present

## 2024-10-03 DIAGNOSIS — M48062 Spinal stenosis, lumbar region with neurogenic claudication: Secondary | ICD-10-CM | POA: Diagnosis not present

## 2024-10-05 ENCOUNTER — Other Ambulatory Visit: Payer: Self-pay

## 2024-10-05 ENCOUNTER — Other Ambulatory Visit: Payer: Self-pay | Admitting: Internal Medicine

## 2024-10-05 DIAGNOSIS — N1832 Chronic kidney disease, stage 3b: Secondary | ICD-10-CM

## 2024-10-05 DIAGNOSIS — L03116 Cellulitis of left lower limb: Secondary | ICD-10-CM

## 2024-10-16 ENCOUNTER — Other Ambulatory Visit: Payer: Self-pay

## 2024-10-16 ENCOUNTER — Ambulatory Visit: Payer: Self-pay | Admitting: Physician Assistant

## 2024-10-16 ENCOUNTER — Ambulatory Visit (HOSPITAL_COMMUNITY)
Admission: RE | Admit: 2024-10-16 | Discharge: 2024-10-16 | Disposition: A | Discharge status: Home / Self Care | Source: Ambulatory Visit | Attending: Physician Assistant | Admitting: Physician Assistant

## 2024-10-16 DIAGNOSIS — R1310 Dysphagia, unspecified: Secondary | ICD-10-CM | POA: Insufficient documentation

## 2024-10-16 MED ORDER — PANTOPRAZOLE SODIUM 40 MG PO TBEC: 40.0000 mg | DELAYED_RELEASE_TABLET | Freq: Two times a day (BID) | ORAL | 3 refills | Status: AC

## 2024-10-16 NOTE — Progress Notes (Signed)
 Please call and notify patient her barium swallow test shows: 1.  Cervical spondylosis.  This is also called also called arthritis of the neck, is common age-related wear and tear affecting the neck's bones (vertebrae) and discs, causing stiffness and pain. 2.  Normal pharyngeal swallowing.  No aspiration. 3.  Small hiatal hernia with acid reflux. 4.  No evidence of esophageal stricture, masses, or abnormal muscle contractions (dysmotility).  Recommendations: - I do not think repeat EGD with dilation would be helpful.  I believe the risk of procedure would outweigh the benefit in her situation. -She can try increasing pantoprazole  to 40 mg twice daily, #60, 2 refills to see if it helps her symptoms. -Please follow-up if upper GI symptoms worsen or persist.  Ellouise Console, PA-C

## 2024-11-03 ENCOUNTER — Ambulatory Visit: Payer: Medicare Other

## 2025-01-02 ENCOUNTER — Ambulatory Visit: Admitting: Internal Medicine

## 2025-03-06 ENCOUNTER — Encounter: Admitting: Family Medicine

## 2025-04-27 ENCOUNTER — Ambulatory Visit
